# Patient Record
Sex: Female | Born: 1960
Health system: Southern US, Community
[De-identification: ages and names within clinical notes are randomized; demographics above are authoritative.]

## PROBLEM LIST (undated history)

## (undated) DIAGNOSIS — T7840XA Allergy, unspecified, initial encounter: Secondary | ICD-10-CM

## (undated) DIAGNOSIS — R079 Chest pain, unspecified: Secondary | ICD-10-CM

## (undated) DIAGNOSIS — U071 COVID-19: Secondary | ICD-10-CM

## (undated) DIAGNOSIS — K219 Gastro-esophageal reflux disease without esophagitis: Secondary | ICD-10-CM

## (undated) DIAGNOSIS — M5136 Other intervertebral disc degeneration, lumbar region: Secondary | ICD-10-CM

## (undated) DIAGNOSIS — E669 Obesity, unspecified: Secondary | ICD-10-CM

## (undated) DIAGNOSIS — M1990 Unspecified osteoarthritis, unspecified site: Secondary | ICD-10-CM

## (undated) DIAGNOSIS — D649 Anemia, unspecified: Secondary | ICD-10-CM

## (undated) DIAGNOSIS — E119 Type 2 diabetes mellitus without complications: Secondary | ICD-10-CM

## (undated) DIAGNOSIS — E785 Hyperlipidemia, unspecified: Secondary | ICD-10-CM

## (undated) DIAGNOSIS — M5430 Sciatica, unspecified side: Secondary | ICD-10-CM

## (undated) DIAGNOSIS — K429 Umbilical hernia without obstruction or gangrene: Secondary | ICD-10-CM

## (undated) DIAGNOSIS — I10 Essential (primary) hypertension: Secondary | ICD-10-CM

## (undated) DIAGNOSIS — R600 Localized edema: Secondary | ICD-10-CM

## (undated) DIAGNOSIS — G4730 Sleep apnea, unspecified: Secondary | ICD-10-CM

## (undated) DIAGNOSIS — M51369 Other intervertebral disc degeneration, lumbar region without mention of lumbar back pain or lower extremity pain: Secondary | ICD-10-CM

## (undated) DIAGNOSIS — E782 Mixed hyperlipidemia: Secondary | ICD-10-CM

## (undated) DIAGNOSIS — G629 Polyneuropathy, unspecified: Secondary | ICD-10-CM

## (undated) DIAGNOSIS — S6291XA Unspecified fracture of right wrist and hand, initial encounter for closed fracture: Secondary | ICD-10-CM

## (undated) HISTORY — DX: Obesity, unspecified: E66.9

## (undated) HISTORY — DX: Chest pain, unspecified: R07.9

## (undated) HISTORY — DX: Mixed hyperlipidemia: E78.2

## (undated) HISTORY — DX: Essential (primary) hypertension: I10

## (undated) HISTORY — DX: Sciatica, unspecified side: M54.30

## (undated) HISTORY — DX: Type 2 diabetes mellitus without complications: E11.9

## (undated) HISTORY — PX: HERNIA REPAIR: SHX51

## (undated) HISTORY — DX: Gastro-esophageal reflux disease without esophagitis: K21.9

## (undated) HISTORY — PX: ABDOMINAL HYSTERECTOMY: SHX81

## (undated) HISTORY — DX: Localized edema: R60.0

## (undated) HISTORY — DX: Umbilical hernia without obstruction or gangrene: K42.9

## (undated) HISTORY — PX: SUPRACERVICAL ABDOMINAL HYSTERECTOMY: SHX5393

## (undated) HISTORY — DX: Hyperlipidemia, unspecified: E78.5

## (undated) HISTORY — DX: Allergy, unspecified, initial encounter: T78.40XA

## (undated) HISTORY — DX: Unspecified fracture of right hand, initial encounter for closed fracture: S62.91XA

---
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
## 2014-12-10 ENCOUNTER — Encounter: Payer: Self-pay | Admitting: Family Medicine

## 2014-12-10 ENCOUNTER — Ambulatory Visit (INDEPENDENT_AMBULATORY_CARE_PROVIDER_SITE_OTHER): Payer: BLUE CROSS/BLUE SHIELD | Admitting: Family Medicine

## 2014-12-10 VITALS — BP 110/68 | HR 76 | Ht 63.0 in | Wt 184.8 lb

## 2014-12-10 DIAGNOSIS — E559 Vitamin D deficiency, unspecified: Secondary | ICD-10-CM

## 2014-12-10 DIAGNOSIS — E669 Obesity, unspecified: Secondary | ICD-10-CM | POA: Diagnosis not present

## 2014-12-10 DIAGNOSIS — R0789 Other chest pain: Secondary | ICD-10-CM | POA: Diagnosis not present

## 2014-12-10 DIAGNOSIS — M5430 Sciatica, unspecified side: Secondary | ICD-10-CM | POA: Diagnosis not present

## 2014-12-10 DIAGNOSIS — I10 Essential (primary) hypertension: Secondary | ICD-10-CM

## 2014-12-10 DIAGNOSIS — E1140 Type 2 diabetes mellitus with diabetic neuropathy, unspecified: Secondary | ICD-10-CM | POA: Insufficient documentation

## 2014-12-10 DIAGNOSIS — E785 Hyperlipidemia, unspecified: Secondary | ICD-10-CM | POA: Diagnosis not present

## 2014-12-10 DIAGNOSIS — K219 Gastro-esophageal reflux disease without esophagitis: Secondary | ICD-10-CM | POA: Diagnosis not present

## 2014-12-10 DIAGNOSIS — E119 Type 2 diabetes mellitus without complications: Secondary | ICD-10-CM

## 2014-12-10 DIAGNOSIS — E66811 Obesity, class 1: Secondary | ICD-10-CM

## 2014-12-10 HISTORY — DX: Obesity, unspecified: E66.9

## 2014-12-10 HISTORY — DX: Essential (primary) hypertension: I10

## 2014-12-10 HISTORY — DX: Type 2 diabetes mellitus without complications: E11.9

## 2014-12-10 HISTORY — DX: Obesity, class 1: E66.811

## 2014-12-10 HISTORY — DX: Sciatica, unspecified side: M54.30

## 2014-12-10 MED ORDER — ASPIRIN 81 MG PO TABS: 81.0000 mg | ORAL_TABLET | Freq: Every day | ORAL | Status: DC

## 2014-12-10 NOTE — Progress Notes (Addendum)
Date:  12/10/2014   Name:  Lisa Galvan   DOB:  Oct 18, 1960   MRN:  [id number]  PCP:  Schuyler Amor, MD    Chief Complaint: Establish Care; Hypertension; and Diabetes   History of Present Illness:  This is a 54 y.o. female with knife-like sternal CP 4d ago radiating to back while driving, no SOB noted, next had indigestion and following day some lightheadedness but all sxs resolved today. Takes Protonix bid for GERD, never had upper scope. HX DM well controlled and sciatica for which she was getting injections with benefit from pain clinic before recent move here. FH CABG in father. Last a1c in 7's.  Review of Systems:  Review of Systems  Constitutional: Negative for unexpected weight change.  Respiratory: Negative for cough.   Cardiovascular: Negative for palpitations and leg swelling.    There are no active problems to display for this patient.   Prior to Admission medications   Medication Sig Start Date End Date Taking? Authorizing Provider  amLODipine-benazepril (LOTREL) 10-20 MG per capsule Take 1 capsule by mouth daily.   Yes Historical Provider, MD  atorvastatin (LIPITOR) 20 MG tablet Take 20 mg by mouth daily.   Yes Historical Provider, MD  canagliflozin (INVOKANA) 300 MG TABS tablet Take 300 mg by mouth daily before breakfast.   Yes Historical Provider, MD  metFORMIN (GLUCOPHAGE) 500 MG tablet Take by mouth 2 (two) times daily with a meal.   Yes Historical Provider, MD  pantoprazole (PROTONIX) 40 MG tablet Take 40 mg by mouth 2 (two) times daily.   Yes Historical Provider, MD  triamterene-hydrochlorothiazide (DYAZIDE) 37.5-25 MG per capsule Take 1 capsule by mouth daily.   Yes Historical Provider, MD    Allergies no known allergies  No past surgical history on file.  Social History  Substance Use Topics  . Smoking status: Never Smoker   . Smokeless tobacco: None  . Alcohol Use: 0.0 oz/week    0 Standard drinks or equivalent per week    Family History  Problem  Relation Age of Onset  . Diabetes Mother   . Cancer Mother     breast and stomach  . Heart disease Father   . Kidney disease Father     dialysis  . Cancer Father     colon    Medication list has been reviewed and updated.  Physical Examination: BP 110/68 mmHg  Pulse 76  Ht 5\' 3"  (1.6 m)  Wt 184 lb 12.8 oz (83.825 kg)  BMI 32.74 kg/m2  Physical Exam  Constitutional: She is oriented to person, place, and time. She appears well-developed and well-nourished.  Neck: No thyromegaly present.  Cardiovascular: Normal rate, regular rhythm and normal heart sounds.   Pulmonary/Chest: Effort normal and breath sounds normal.  Abdominal: Soft. She exhibits no distension and no mass. There is no tenderness.  Musculoskeletal: She exhibits no edema.  Lymphadenopathy:    She has no cervical adenopathy.  Neurological: She is alert and oriented to person, place, and time. Coordination normal.  Skin: Skin is warm and dry. No rash noted.  Psychiatric: She has a normal mood and affect. Her behavior is normal.    Assessment and Plan:  1. Atypical chest pain Refer cardiology for possible stress test given multiple risk factors, take asa 81 mg daily - Ambulatory referral to Cardiology  2. Obesity (BMI 30.0-34.9) Discuss weight loss/exercise next visit - TSH - Vitamin D (25 hydroxy)  3. Chronic sciatica, unspecified laterality Refer pain clinic for resumption of  injections - Ambulatory referral to Pain Clinic  4. Essential hypertension Well controlled, continue current regimen - Comprehensive metabolic panel - CBC  5. Type 2 diabetes mellitus without complication Well controlled, continue current regimen - Urine Microalbumin w/creat. ratio - HgB A1c  6. Hyperlipidemia Unclear control, continue atorvastatin - Lipid Profile  7. Gastroesophageal reflux disease, esophagitis presence not specified Well controlled, continue Protonix for now, consider EGD  Return in about 4 weeks  (around 01/07/2015).  Dionne Ano. Kingsley Spittle MD [facility]  12/10/2014  Addendum: metformin increased to 1000 mg bid and vitamin D 5000 IU daily added, consider d/c Dyazide and decrease Protonix to daily next visit

## 2014-12-11 LAB — COMPREHENSIVE METABOLIC PANEL
ALK PHOS: 54 IU/L (ref 39–117)
ALT: 17 IU/L (ref 0–32)
AST: 20 IU/L (ref 0–40)
Albumin/Globulin Ratio: 2.1 (ref 1.1–2.5)
Albumin: 4.7 g/dL (ref 3.5–5.5)
BILIRUBIN TOTAL: 1.1 mg/dL (ref 0.0–1.2)
BUN / CREAT RATIO: 12 (ref 9–23)
BUN: 10 mg/dL (ref 6–24)
CHLORIDE: 95 mmol/L — AB (ref 97–108)
CO2: 25 mmol/L (ref 18–29)
CREATININE: 0.81 mg/dL (ref 0.57–1.00)
Calcium: 10.1 mg/dL (ref 8.7–10.2)
GFR calc Af Amer: 95 mL/min/{1.73_m2} (ref >59)
GFR calc non Af Amer: 82 mL/min/{1.73_m2} (ref >59)
GLOBULIN, TOTAL: 2.2 g/dL (ref 1.5–4.5)
Glucose: 104 mg/dL — ABNORMAL HIGH (ref 65–99)
POTASSIUM: 3.8 mmol/L (ref 3.5–5.2)
SODIUM: 140 mmol/L (ref 134–144)
Total Protein: 6.9 g/dL (ref 6.0–8.5)

## 2014-12-11 LAB — CBC
Hematocrit: 41.2 % (ref 34.0–46.6)
Hemoglobin: 13.9 g/dL (ref 11.1–15.9)
MCH: 27.8 pg (ref 26.6–33.0)
MCHC: 33.7 g/dL (ref 31.5–35.7)
MCV: 82 fL (ref 79–97)
PLATELETS: 256 10*3/uL (ref 150–379)
RBC: 5 x10E6/uL (ref 3.77–5.28)
RDW: 16 % — AB (ref 12.3–15.4)
WBC: 5.4 10*3/uL (ref 3.4–10.8)

## 2014-12-11 LAB — VITAMIN D 25 HYDROXY (VIT D DEFICIENCY, FRACTURES): Vit D, 25-Hydroxy: 9.1 ng/mL — ABNORMAL LOW (ref 30.0–100.0)

## 2014-12-11 LAB — TSH: TSH: 0.902 u[IU]/mL (ref 0.450–4.500)

## 2014-12-11 LAB — LIPID PANEL
CHOLESTEROL TOTAL: 172 mg/dL (ref 100–199)
Chol/HDL Ratio: 1.8 ratio units (ref 0.0–4.4)
HDL: 95 mg/dL (ref >39)
LDL Calculated: 59 mg/dL (ref 0–99)
TRIGLYCERIDES: 90 mg/dL (ref 0–149)
VLDL CHOLESTEROL CAL: 18 mg/dL (ref 5–40)

## 2014-12-11 LAB — HEMOGLOBIN A1C
ESTIMATED AVERAGE GLUCOSE: 169 mg/dL
Hgb A1c MFr Bld: 7.5 % — ABNORMAL HIGH (ref 4.8–5.6)

## 2014-12-11 LAB — MICROALBUMIN / CREATININE URINE RATIO: MICROALB/CREAT RATIO: <4 mg/g creat (ref 0.0–30.0)

## 2014-12-13 DIAGNOSIS — E559 Vitamin D deficiency, unspecified: Secondary | ICD-10-CM | POA: Insufficient documentation

## 2014-12-13 DIAGNOSIS — R809 Proteinuria, unspecified: Secondary | ICD-10-CM | POA: Insufficient documentation

## 2014-12-13 MED ORDER — VITAMIN D3 125 MCG (5000 UT) PO CAPS: 1.0000 | ORAL_CAPSULE | Freq: Every day | ORAL | Status: DC

## 2014-12-13 NOTE — Addendum Note (Signed)
Addended by: Schuyler Amor on: 12/13/2014 12:41 PM   Modules accepted: Orders, SmartSet

## 2014-12-15 DIAGNOSIS — R079 Chest pain, unspecified: Secondary | ICD-10-CM

## 2014-12-15 DIAGNOSIS — E782 Mixed hyperlipidemia: Secondary | ICD-10-CM | POA: Insufficient documentation

## 2014-12-15 HISTORY — DX: Chest pain, unspecified: R07.9

## 2014-12-16 ENCOUNTER — Encounter: Payer: Self-pay | Admitting: Family Medicine

## 2014-12-16 ENCOUNTER — Ambulatory Visit: Payer: BLUE CROSS/BLUE SHIELD | Attending: Pain Medicine | Admitting: Pain Medicine

## 2014-12-16 ENCOUNTER — Other Ambulatory Visit: Payer: Self-pay | Admitting: Family Medicine

## 2014-12-16 ENCOUNTER — Encounter: Payer: Self-pay | Admitting: Pain Medicine

## 2014-12-16 VITALS — BP 102/72 | HR 74 | Temp 97.8°F | Resp 16 | Ht 63.0 in | Wt 185.0 lb

## 2014-12-16 DIAGNOSIS — M5416 Radiculopathy, lumbar region: Secondary | ICD-10-CM | POA: Diagnosis not present

## 2014-12-16 DIAGNOSIS — M4806 Spinal stenosis, lumbar region: Secondary | ICD-10-CM | POA: Insufficient documentation

## 2014-12-16 DIAGNOSIS — M533 Sacrococcygeal disorders, not elsewhere classified: Secondary | ICD-10-CM

## 2014-12-16 DIAGNOSIS — M47816 Spondylosis without myelopathy or radiculopathy, lumbar region: Secondary | ICD-10-CM

## 2014-12-16 DIAGNOSIS — M545 Low back pain: Secondary | ICD-10-CM | POA: Diagnosis present

## 2014-12-16 DIAGNOSIS — E119 Type 2 diabetes mellitus without complications: Secondary | ICD-10-CM | POA: Diagnosis not present

## 2014-12-16 DIAGNOSIS — E1340 Other specified diabetes mellitus with diabetic neuropathy, unspecified: Secondary | ICD-10-CM

## 2014-12-16 DIAGNOSIS — M79605 Pain in left leg: Secondary | ICD-10-CM | POA: Diagnosis present

## 2014-12-16 DIAGNOSIS — M5136 Other intervertebral disc degeneration, lumbar region: Secondary | ICD-10-CM | POA: Insufficient documentation

## 2014-12-16 DIAGNOSIS — I10 Essential (primary) hypertension: Secondary | ICD-10-CM | POA: Insufficient documentation

## 2014-12-16 DIAGNOSIS — M79604 Pain in right leg: Secondary | ICD-10-CM | POA: Diagnosis present

## 2014-12-16 NOTE — Patient Instructions (Addendum)
GENERAL RISKS AND COMPLICATIONS  What are the risk, side effects and possible complications? Generally speaking, most procedures are safe.  However, with any procedure there are risks, side effects, and the possibility of complications.  The risks and complications are dependent upon the sites that are lesioned, or the type of nerve block to be performed.  The closer the procedure is to the spine, the more serious the risks are.  Great care is taken when placing the radio frequency needles, block needles or lesioning probes, but sometimes complications can occur. 1. Infection: Any time there is an injection through the skin, there is a risk of infection.  This is why sterile conditions are used for these blocks.  There are four possible types of infection. 1. Localized skin infection. 2. Central Nervous System Infection-This can be in the form of Meningitis, which can be deadly. 3. Epidural Infections-This can be in the form of an epidural abscess, which can cause pressure inside of the spine, causing compression of the spinal cord with subsequent paralysis. This would require an emergency surgery to decompress, and there are no guarantees that the patient would recover from the paralysis. 4. Discitis-This is an infection of the intervertebral discs.  It occurs in about 1% of discography procedures.  It is difficult to treat and it may lead to surgery.        2. Pain: the needles have to go through skin and soft tissues, will cause soreness.       3. Damage to internal structures:  The nerves to be lesioned may be near blood vessels or    other nerves which can be potentially damaged.       4. Bleeding: Bleeding is more common if the patient is taking blood thinners such as  aspirin, Coumadin, Ticiid, Plavix, etc., or if he/she have some genetic predisposition  such as hemophilia. Bleeding into the spinal canal can cause compression of the spinal  cord with subsequent paralysis.  This would require an  emergency surgery to  decompress and there are no guarantees that the patient would recover from the  paralysis.       5. Pneumothorax:  Puncturing of a lung is a possibility, every time a needle is introduced in  the area of the chest or upper back.  Pneumothorax refers to free air around the  collapsed lung(s), inside of the thoracic cavity (chest cavity).  Another two possible  complications related to a similar event would include: Hemothorax and Chylothorax.   These are variations of the Pneumothorax, where instead of air around the collapsed  lung(s), you may have blood or chyle, respectively.       6. Spinal headaches: They may occur with any procedures in the area of the spine.       7. Persistent CSF (Cerebro-Spinal Fluid) leakage: This is a rare problem, but may occur  with prolonged intrathecal or epidural catheters either due to the formation of a fistulous  track or a dural tear.       8. Nerve damage: By working so close to the spinal cord, there is always a possibility of  nerve damage, which could be as serious as a permanent spinal cord injury with  paralysis.       9. Death:  Although rare, severe deadly allergic reactions known as "Anaphylactic  reaction" can occur to any of the medications used.      10. Worsening of the symptoms:  We can always make thing worse.    What are the chances of something like this happening? Chances of any of this occuring are extremely low.  By statistics, you have more of a chance of getting killed in a motor vehicle accident: while driving to the hospital than any of the above occurring .  Nevertheless, you should be aware that they are possibilities.  In general, it is similar to taking a shower.  Everybody knows that you can slip, hit your head and get killed.  Does that mean that you should not shower again?  Nevertheless always keep in mind that statistics do not mean anything if you happen to be on the wrong side of them.  Even if a procedure has a 1  (one) in a 1,000,000 (million) chance of going wrong, it you happen to be that one..Also, keep in mind that by statistics, you have more of a chance of having something go wrong when taking medications.  Who should not have this procedure? If you are on a blood thinning medication (e.g. Coumadin, Plavix, see list of "Blood Thinners"), or if you have an active infection going on, you should not have the procedure.  If you are taking any blood thinners, please inform your physician.  How should I prepare for this procedure?  Do not eat or drink anything at least six hours prior to the procedure.  Bring a driver with you .  It cannot be a taxi.  Come accompanied by an adult that can drive you back, and that is strong enough to help you if your legs get weak or numb from the local anesthetic.  Take all of your medicines the morning of the procedure with just enough water to swallow them.  If you have diabetes, make sure that you are scheduled to have your procedure done first thing in the morning, whenever possible.  If you have diabetes, take only half of your insulin dose and notify our nurse that you have done so as soon as you arrive at the clinic.  If you are diabetic, but only take blood sugar pills (oral hypoglycemic), then do not take them on the morning of your procedure.  You may take them after you have had the procedure.  Do not take aspirin or any aspirin-containing medications, at least eleven (11) days prior to the procedure.  They may prolong bleeding.  Wear loose fitting clothing that may be easy to take off and that you would not mind if it got stained with Betadine or blood.  Do not wear any jewelry or perfume  Remove any nail coloring.  It will interfere with some of our monitoring equipment.  NOTE: Remember that this is not meant to be interpreted as a complete list of all possible complications.  Unforeseen problems may occur.  BLOOD THINNERS The following drugs  contain aspirin or other products, which can cause increased bleeding during surgery and should not be taken for 2 weeks prior to and 1 week after surgery.  If you should need take something for relief of minor pain, you may take acetaminophen which is found in Tylenol,m Datril, Anacin-3 and Panadol. It is not blood thinner. The products listed below are.  Do not take any of the products listed below in addition to any listed on your instruction sheet.  A.P.C or A.P.C with Codeine Codeine Phosphate Capsules #3 Ibuprofen Ridaura  ABC compound Congesprin Imuran rimadil  Advil Cope Indocin Robaxisal  Alka-Seltzer Effervescent Pain Reliever and Antacid Coricidin or Coricidin-D  Indomethacin Rufen    Alka-Seltzer plus Cold Medicine Cosprin Ketoprofen S-A-C Tablets  Anacin Analgesic Tablets or Capsules Coumadin Korlgesic Salflex  Anacin Extra Strength Analgesic tablets or capsules CP-2 Tablets Lanoril Salicylate  Anaprox Cuprimine Capsules Levenox Salocol  Anexsia-D Dalteparin Magan Salsalate  Anodynos Darvon compound Magnesium Salicylate Sine-off  Ansaid Dasin Capsules Magsal Sodium Salicylate  Anturane Depen Capsules Marnal Soma  APF Arthritis pain formula Dewitt's Pills Measurin Stanback  Argesic Dia-Gesic Meclofenamic Sulfinpyrazone  Arthritis Bayer Timed Release Aspirin Diclofenac Meclomen Sulindac  Arthritis pain formula Anacin Dicumarol Medipren Supac  Analgesic (Safety coated) Arthralgen Diffunasal Mefanamic Suprofen  Arthritis Strength Bufferin Dihydrocodeine Mepro Compound Suprol  Arthropan liquid Dopirydamole Methcarbomol with Aspirin Synalgos  ASA tablets/Enseals Disalcid Micrainin Tagament  Ascriptin Doan's Midol Talwin  Ascriptin A/D Dolene Mobidin Tanderil  Ascriptin Extra Strength Dolobid Moblgesic Ticlid  Ascriptin with Codeine Doloprin or Doloprin with Codeine Momentum Tolectin  Asperbuf Duoprin Mono-gesic Trendar  Aspergum Duradyne Motrin or Motrin IB Triminicin  Aspirin  plain, buffered or enteric coated Durasal Myochrisine Trigesic  Aspirin Suppositories Easprin Nalfon Trillsate  Aspirin with Codeine Ecotrin Regular or Extra Strength Naprosyn Uracel  Atromid-S Efficin Naproxen Ursinus  Auranofin Capsules Elmiron Neocylate Vanquish  Axotal Emagrin Norgesic Verin  Azathioprine Empirin or Empirin with Codeine Normiflo Vitamin E  Azolid Emprazil Nuprin Voltaren  Bayer Aspirin plain, buffered or children's or timed BC Tablets or powders Encaprin Orgaran Warfarin Sodium  Buff-a-Comp Enoxaparin Orudis Zorpin  Buff-a-Comp with Codeine Equegesic Os-Cal-Gesic   Buffaprin Excedrin plain, buffered or Extra Strength Oxalid   Bufferin Arthritis Strength Feldene Oxphenbutazone   Bufferin plain or Extra Strength Feldene Capsules Oxycodone with Aspirin   Bufferin with Codeine Fenoprofen Fenoprofen Pabalate or Pabalate-SF   Buffets II Flogesic Panagesic   Buffinol plain or Extra Strength Florinal or Florinal with Codeine Panwarfarin   Buf-Tabs Flurbiprofen Penicillamine   Butalbital Compound Four-way cold tablets Penicillin   Butazolidin Fragmin Pepto-Bismol   Carbenicillin Geminisyn Percodan   Carna Arthritis Reliever Geopen Persantine   Carprofen Gold's salt Persistin   Chloramphenicol Goody's Phenylbutazone   Chloromycetin Haltrain Piroxlcam   Clmetidine heparin Plaquenil   Cllnoril Hyco-pap Ponstel   Clofibrate Hydroxy chloroquine Propoxyphen         Before stopping any of these medications, be sure to consult the physician who ordered them.  Some, such as Coumadin (Warfarin) are ordered to prevent or treat serious conditions such as "deep thrombosis", "pumonary embolisms", and other heart problems.  The amount of time that you may need off of the medication may also vary with the medication and the reason for which you were taking it.  If you are taking any of these medications, please make sure you notify your pain physician before you undergo any  procedures.         Epidural Steroid Injection Patient Information  Description: The epidural space surrounds the nerves as they exit the spinal cord.  In some patients, the nerves can be compressed and inflamed by a bulging disc or a tight spinal canal (spinal stenosis).  By injecting steroids into the epidural space, we can bring irritated nerves into direct contact with a potentially helpful medication.  These steroids act directly on the irritated nerves and can reduce swelling and inflammation which often leads to decreased pain.  Epidural steroids may be injected anywhere along the spine and from the neck to the low back depending upon the location of your pain.   After numbing the skin with local anesthetic (like Novocaine), a small needle is passed   into the epidural space slowly.  You may experience a sensation of pressure while this is being done.  The entire block usually last less than 10 minutes.  Conditions which may be treated by epidural steroids:   Low back and leg pain  Neck and arm pain  Spinal stenosis  Post-laminectomy syndrome  Herpes zoster (shingles) pain  Pain from compression fractures  Preparation for the injection:  1. Do not eat any solid food or dairy products within 6 hours of your appointment.  2. You may drink clear liquids up to 2 hours before appointment.  Clear liquids include water, black coffee, juice or soda.  No milk or cream please. 3. You may take your regular medication, including pain medications, with a sip of water before your appointment  Diabetics should hold regular insulin (if taken separately) and take 1/2 normal NPH dos the morning of the procedure.  Carry some sugar containing items with you to your appointment. 4. A driver must accompany you and be prepared to drive you home after your procedure.  5. Bring all your current medications with your. 6. An IV may be inserted and sedation may be given at the discretion of the  physician.   7. A blood pressure cuff, EKG and other monitors will often be applied during the procedure.  Some patients may need to have extra oxygen administered for a short period. 8. You will be asked to provide medical information, including your allergies, prior to the procedure.  We must know immediately if you are taking blood thinners (like Coumadin/Warfarin)  Or if you are allergic to IV iodine contrast (dye). We must know if you could possible be pregnant.  Possible side-effects:  Bleeding from needle site  Infection (rare, may require surgery)  Nerve injury (rare)  Numbness & tingling (temporary)  Difficulty urinating (rare, temporary)  Spinal headache ( a headache worse with upright posture)  Light -headedness (temporary)  Pain at injection site (several days)  Decreased blood pressure (temporary)  Weakness in arm/leg (temporary)  Pressure sensation in back/neck (temporary)  Call if you experience:  Fever/chills associated with headache or increased back/neck pain.  Headache worsened by an upright position.  New onset weakness or numbness of an extremity below the injection site  Hives or difficulty breathing (go to the emergency room)  Inflammation or drainage at the infection site  Severe back/neck pain  Any new symptoms which are concerning to you  Please note:  Although the local anesthetic injected can often make your back or neck feel good for several hours after the injection, the pain will likely return.  It takes 3-7 days for steroids to work in the epidural space.  You may not notice any pain relief for at least that one week.  If effective, we will often do a series of three injections spaced 3-6 weeks apart to maximally decrease your pain.  After the initial series, we generally will wait several months before considering a repeat injection of the same type.  If you have any questions, please call [phone] [facility]  Continue present medications  Lumbar epidural steroid injection to be performed at time of return appointment  F/U PCP Dr. Schuyler Amor    for evaliation of  BP diabetes mellitus  and general medical  condition  F/U surgical evaluation to  be considered   F/U neurological evaluation to be considered   May consider radiofrequency rhizolysis or intraspinal procedures pending response to  present treatment and F/U evaluation   Patient to call [facility] should patient have concerns prior to scheduled return appointmen.  GENERAL RISKS AND COMPLICATIONS  What are the risk, side effects and possible complications? Generally speaking, most procedures are safe.  However, with any procedure there are risks, side effects, and the possibility of complications.  The risks and complications are dependent upon the sites that are lesioned, or the type of nerve block to be performed.  The closer the procedure is to the spine, the more serious the risks are.  Great care is taken when placing the radio frequency needles, block needles or lesioning probes, but sometimes complications can occur. 1. Infection: Any time there is an injection through the skin, there is a risk of infection.  This is why sterile conditions are used for these blocks.  There are four possible types of infection. 1. Localized skin infection. 2. Central Nervous System Infection-This can be in the form of Meningitis, which can be deadly. 3. Epidural Infections-This can be in the form of an epidural abscess, which can cause pressure inside of the spine, causing compression of the spinal cord with subsequent paralysis. This would require an emergency surgery to decompress, and there are no guarantees that the patient would recover from the paralysis. 4. Discitis-This is an infection of the intervertebral discs.  It occurs in about 1% of discography procedures.  It is difficult to treat and it may lead to  surgery.        2. Pain: the needles have to go through skin and soft tissues, will cause soreness.       3. Damage to internal structures:  The nerves to be lesioned may be near blood vessels or    other nerves which can be potentially damaged.       4. Bleeding: Bleeding is more common if the patient is taking blood thinners such as  aspirin, Coumadin, Ticiid, Plavix, etc., or if he/she have some genetic predisposition  such as hemophilia. Bleeding into the spinal canal can cause compression of the spinal  cord with subsequent paralysis.  This would require an emergency surgery to  decompress and there are no guarantees that the patient would recover from the  paralysis.       5. Pneumothorax:  Puncturing of a lung is a possibility, every time a needle is introduced in  the area of the chest or upper back.  Pneumothorax refers to free air around the  collapsed lung(s), inside of the thoracic cavity (chest cavity).  Another two possible  complications related to a similar event would include: Hemothorax and Chylothorax.   These are variations of the Pneumothorax, where instead of air around the collapsed  lung(s), you may have blood or chyle, respectively.       6. Spinal headaches: They may occur with any procedures in the area of the spine.       7. Persistent CSF (Cerebro-Spinal Fluid) leakage: This is a rare problem, but may occur  with prolonged intrathecal or epidural catheters either due to the formation of a fistulous  track or a dural tear.       8. Nerve damage: By working so close to the spinal cord, there is always a possibility of  nerve damage, which could be as serious as a permanent spinal cord injury with  paralysis.       9. Death:  Although rare, severe deadly allergic reactions known as "Anaphylactic  reaction" can occur  to any of the medications used.      10. Worsening of the symptoms:  We can always make thing worse.  What are the chances of something like this  happening? Chances of any of this occuring are extremely low.  By statistics, you have more of a chance of getting killed in a motor vehicle accident: while driving to the hospital than any of the above occurring .  Nevertheless, you should be aware that they are possibilities.  In general, it is similar to taking a shower.  Everybody knows that you can slip, hit your head and get killed.  Does that mean that you should not shower again?  Nevertheless always keep in mind that statistics do not mean anything if you happen to be on the wrong side of them.  Even if a procedure has a 1 (one) in a 1,000,000 (million) chance of going wrong, it you happen to be that one..Also, keep in mind that by statistics, you have more of a chance of having something go wrong when taking medications.  Who should not have this procedure? If you are on a blood thinning medication (e.g. Coumadin, Plavix, see list of "Blood Thinners"), or if you have an active infection going on, you should not have the procedure.  If you are taking any blood thinners, please inform your physician.  How should I prepare for this procedure?  Do not eat or drink anything at least six hours prior to the procedure.  Bring a driver with you .  It cannot be a taxi.  Come accompanied by an adult that can drive you back, and that is strong enough to help you if your legs get weak or numb from the local anesthetic.  Take all of your medicines the morning of the procedure with just enough water to swallow them.  If you have diabetes, make sure that you are scheduled to have your procedure done first thing in the morning, whenever possible.  If you have diabetes, take only half of your insulin dose and notify our nurse that you have done so as soon as you arrive at the clinic.  If you are diabetic, but only take blood sugar pills (oral hypoglycemic), then do not take them on the morning of your procedure.  You may take them after you have had the  procedure.  Do not take aspirin or any aspirin-containing medications, at least eleven (11) days prior to the procedure.  They may prolong bleeding.  Wear loose fitting clothing that may be easy to take off and that you would not mind if it got stained with Betadine or blood.  Do not wear any jewelry or perfume  Remove any nail coloring.  It will interfere with some of our monitoring equipment.  NOTE: Remember that this is not meant to be interpreted as a complete list of all possible complications.  Unforeseen problems may occur.  BLOOD THINNERS The following drugs contain aspirin or other products, which can cause increased bleeding during surgery and should not be taken for 2 weeks prior to and 1 week after surgery.  If you should need take something for relief of minor pain, you may take acetaminophen which is found in Tylenol,m Datril, Anacin-3 and Panadol. It is not blood thinner. The products listed below are.  Do not take any of the products listed below in addition to any listed on your instruction sheet.  A.P.C or A.P.C with Codeine Codeine Phosphate Capsules #3 Ibuprofen Ridaura  ABC compound Congesprin Imuran rimadil  Advil Cope Indocin Robaxisal  Alka-Seltzer Effervescent Pain Reliever and Antacid Coricidin or Coricidin-D  Indomethacin Rufen  Alka-Seltzer plus Cold Medicine Cosprin Ketoprofen S-A-C Tablets  Anacin Analgesic Tablets or Capsules Coumadin Korlgesic Salflex  Anacin Extra Strength Analgesic tablets or capsules CP-2 Tablets Lanoril Salicylate  Anaprox Cuprimine Capsules Levenox Salocol  Anexsia-D Dalteparin Magan Salsalate  Anodynos Darvon compound Magnesium Salicylate Sine-off  Ansaid Dasin Capsules Magsal Sodium Salicylate  Anturane Depen Capsules Marnal Soma  APF Arthritis pain formula Dewitt's Pills Measurin Stanback  Argesic Dia-Gesic Meclofenamic Sulfinpyrazone  Arthritis Bayer Timed Release Aspirin Diclofenac Meclomen Sulindac  Arthritis pain formula  Anacin Dicumarol Medipren Supac  Analgesic (Safety coated) Arthralgen Diffunasal Mefanamic Suprofen  Arthritis Strength Bufferin Dihydrocodeine Mepro Compound Suprol  Arthropan liquid Dopirydamole Methcarbomol with Aspirin Synalgos  ASA tablets/Enseals Disalcid Micrainin Tagament  Ascriptin Doan's Midol Talwin  Ascriptin A/D Dolene Mobidin Tanderil  Ascriptin Extra Strength Dolobid Moblgesic Ticlid  Ascriptin with Codeine Doloprin or Doloprin with Codeine Momentum Tolectin  Asperbuf Duoprin Mono-gesic Trendar  Aspergum Duradyne Motrin or Motrin IB Triminicin  Aspirin plain, buffered or enteric coated Durasal Myochrisine Trigesic  Aspirin Suppositories Easprin Nalfon Trillsate  Aspirin with Codeine Ecotrin Regular or Extra Strength Naprosyn Uracel  Atromid-S Efficin Naproxen Ursinus  Auranofin Capsules Elmiron Neocylate Vanquish  Axotal Emagrin Norgesic Verin  Azathioprine Empirin or Empirin with Codeine Normiflo Vitamin E  Azolid Emprazil Nuprin Voltaren  Bayer Aspirin plain, buffered or children's or timed BC Tablets or powders Encaprin Orgaran Warfarin Sodium  Buff-a-Comp Enoxaparin Orudis Zorpin  Buff-a-Comp with Codeine Equegesic Os-Cal-Gesic   Buffaprin Excedrin plain, buffered or Extra Strength Oxalid   Bufferin Arthritis Strength Feldene Oxphenbutazone   Bufferin plain or Extra Strength Feldene Capsules Oxycodone with Aspirin   Bufferin with Codeine Fenoprofen Fenoprofen Pabalate or Pabalate-SF   Buffets II Flogesic Panagesic   Buffinol plain or Extra Strength Florinal or Florinal with Codeine Panwarfarin   Buf-Tabs Flurbiprofen Penicillamine   Butalbital Compound Four-way cold tablets Penicillin   Butazolidin Fragmin Pepto-Bismol   Carbenicillin Geminisyn Percodan   Carna Arthritis Reliever Geopen Persantine   Carprofen Gold's salt Persistin   Chloramphenicol Goody's Phenylbutazone   Chloromycetin Haltrain Piroxlcam   Clmetidine heparin Plaquenil   Cllnoril Hyco-pap  Ponstel   Clofibrate Hydroxy chloroquine Propoxyphen         Before stopping any of these medications, be sure to consult the physician who ordered them.  Some, such as Coumadin (Warfarin) are ordered to prevent or treat serious conditions such as "deep thrombosis", "pumonary embolisms", and other heart problems.  The amount of time that you may need off of the medication may also vary with the medication and the reason for which you were taking it.  If you are taking any of these medications, please make sure you notify your pain physician before you undergo any procedures.         Epidural Steroid Injection Patient Information  Description: The epidural space surrounds the nerves as they exit the spinal cord.  In some patients, the nerves can be compressed and inflamed by a bulging disc or a tight spinal canal (spinal stenosis).  By injecting steroids into the epidural space, we can bring irritated nerves into direct contact with a potentially helpful medication.  These steroids act directly on the irritated nerves and can reduce swelling and inflammation which often leads to decreased pain.  Epidural steroids may be injected anywhere along the spine and from the neck to the low  back depending upon the location of your pain.   After numbing the skin with local anesthetic (like Novocaine), a small needle is passed into the epidural space slowly.  You may experience a sensation of pressure while this is being done.  The entire block usually last less than 10 minutes.  Conditions which may be treated by epidural steroids:   Low back and leg pain  Neck and arm pain  Spinal stenosis  Post-laminectomy syndrome  Herpes zoster (shingles) pain  Pain from compression fractures  Preparation for the injection:  9. Do not eat any solid food or dairy products within 6 hours of your appointment.  10. You may drink clear liquids up to 2 hours before appointment.  Clear liquids include water,  black coffee, juice or soda.  No milk or cream please. 11. You may take your regular medication, including pain medications, with a sip of water before your appointment  Diabetics should hold regular insulin (if taken separately) and take 1/2 normal NPH dos the morning of the procedure.  Carry some sugar containing items with you to your appointment. 12. A driver must accompany you and be prepared to drive you home after your procedure.  13. Bring all your current medications with your. 14. An IV may be inserted and sedation may be given at the discretion of the physician.   15. A blood pressure cuff, EKG and other monitors will often be applied during the procedure.  Some patients may need to have extra oxygen administered for a short period. 16. You will be asked to provide medical information, including your allergies, prior to the procedure.  We must know immediately if you are taking blood thinners (like Coumadin/Warfarin)  Or if you are allergic to IV iodine contrast (dye). We must know if you could possible be pregnant.  Possible side-effects:  Bleeding from needle site  Infection (rare, may require surgery)  Nerve injury (rare)  Numbness & tingling (temporary)  Difficulty urinating (rare, temporary)  Spinal headache ( a headache worse with upright posture)  Light -headedness (temporary)  Pain at injection site (several days)  Decreased blood pressure (temporary)  Weakness in arm/leg (temporary)  Pressure sensation in back/neck (temporary)  Call if you experience:  Fever/chills associated with headache or increased back/neck pain.  Headache worsened by an upright position.  New onset weakness or numbness of an extremity below the injection site  Hives or difficulty breathing (go to the emergency room)  Inflammation or drainage at the infection site  Severe back/neck pain  Any new symptoms which are concerning to you  Please note:  Although the local anesthetic  injected can often make your back or neck feel good for several hours after the injection, the pain will likely return.  It takes 3-7 days for steroids to work in the epidural space.  You may not notice any pain relief for at least that one week.  If effective, we will often do a series of three injections spaced 3-6 weeks apart to maximally decrease your pain.  After the initial series, we generally will wait several months before considering a repeat injection of the same type.  If you have any questions, please call [phone] [facility]

## 2014-12-16 NOTE — Progress Notes (Signed)
Subjective:    Patient ID: Lisa Galvan, female    DOB: December 24, 1960, 54 y.o.   MRN: [id number]  HPI  Patient is a 54 year old female who comes to pain management Center at the request of Dr. Schuyler Amor for further evaluation and treatment of lower back and lower extremity pain. Patient states that her pain began insidiously approximately 1-1/2 years ago. Patient is undergone prior evaluation and treatment of her condition at pain management facility in [place]. Patient stated that she is been informed that she has patient nerves contributing to her symptoms. We discussed patient's condition on today's visit and attempted to obtain previous lumbar MRI reports. For further detailed explanation of patient's condition. The patient described her pain is shooting type sensation from the lumbar region to the lower extremity affecting the left lower extremity more than the right lower extremity. Patient states the pain increases with walking and at times lying down. Patient stated the pain had decreased with nerve blocks and with medications. We discussed patient's condition and will consider interventional treatment at time return appointment as discussed and explained to patient on today's visit. Patient will for significant period of time and was still unable to obtain her lumbar MRI report from her diagnostic Center from the Pain Management Center. We will await the results of the studies and if we were able to obtain the previous study we will order a new lumbar MRI and consider other studies as well. The patient was understanding and agrees suggested treatment plan. The present time we had tentatively schedule patient for lumbar epidural steroid injection to be performed at time return appointment in attempt to decrease severity of symptoms, minimize progression of symptoms and avoid the need for more involved treatment. The patient was understanding and agreement status treatment plan.  Review of  Systems   Cardiovascular High blood pressure Daily aspirin intake  Pulmonary Unremarkable  Neurological Unremarkable  Psychological Unremarkable  Gastrointestinal Unremarkable  Genitourinary Unremarkable  Hematological Unremarkable  Endocrine Diabetes mellitus  Rheumatological Unremarkable  Musculoskeletal Unremarkable  Other significant Unremarkable     Objective:   Physical Exam  There was tends to palpation splenius capitis and occipitalis musculature regions of mild degree. There was mild tenderness of the acromioclavicular and glenohumeral joint regions. Palpation of the cervical facet and cervical paraspinal musculature region reproduced mild discomfort. There was mild tends over the thoracic facet thoracic paraspinal musculature region. No crepitus of the thoracic region was noted. Tinel and Phalen's maneuver were without increased pain of significant degree. Patient appeared to be with bilaterally equal grip strength. Palpation of the lumbar paraspinal muscles region lumbar facet region was a tends to palpation of moderate moderately severe degree. Lateral bending and rotation extension and palpation of the lumbar facets reproduce moderate to moderately severe discomfort. There was tenderness over the PSIS and PII S region of moderate degree. Mild tenderness of the greater trochanteric region iliotibial band region. Straight leg raising was tolerates approximately 20 without a definite increased pain with dorsiflexion noted. There appeared to be negative clonus negative Homans. Patient was with mild difficulty attempt attempted to stand on tiptoes and heels. He was negative clonus negative Homans EHL strength appeared to be decreased. No definite sensory deficit of dermatomal distribution detected. Abdomen soft nontender and no costovertebral tenderness was noted.      Assessment & Plan:   Degenerative disc disease lumbar spine  Lumbar facet  syndrome  Lumbar radiculopathy  Lumbar stenosis  Sacroiliac joint dysfunction   Plan  Continue present medications  Lumbar epidural steroid injection to be performed at time return appointment  F/U PCP  Dr. Schuyler Amor for evaliation of  BP and general medical  condition  F/U surgical evaluation has been addressed and will be considered as discussed  F/U neurological evaluation. May consider PNCV/EMG studies pending further assessment of patient's condition response to present treatment  Lumbar MRI. Patient will bring a report of her lumbar MRI if we are unable to obtain the report from patient's imaging facility or patient's pain management facility. We will order a lumbar MRI if we are unable to obtain patient's prior lumbar MRI results  May consider radiofrequency rhizolysis or intraspinal procedures pending response to present treatment and F/U evaluation   Patient to call Pain Management Center should patient have concerns prior to scheduled return appointment.

## 2014-12-16 NOTE — Progress Notes (Signed)
Discharged to home ambulatory.  Pre procedure instructions given with teach back 3 done. 

## 2014-12-16 NOTE — Progress Notes (Signed)
Safety precautions to be maintained throughout the outpatient stay will include: orient to surroundings, keep bed in low position, maintain call bell within reach at all times, provide assistance with transfer out of bed and ambulation.  

## 2014-12-23 ENCOUNTER — Other Ambulatory Visit: Payer: Self-pay | Admitting: Pain Medicine

## 2014-12-29 ENCOUNTER — Encounter: Payer: Self-pay | Admitting: Pain Medicine

## 2014-12-29 ENCOUNTER — Ambulatory Visit: Payer: BLUE CROSS/BLUE SHIELD | Attending: Pain Medicine | Admitting: Pain Medicine

## 2014-12-29 VITALS — BP 116/74 | HR 81 | Temp 98.0°F | Resp 18 | Ht 63.0 in | Wt 184.0 lb

## 2014-12-29 DIAGNOSIS — M545 Low back pain: Secondary | ICD-10-CM | POA: Diagnosis present

## 2014-12-29 DIAGNOSIS — M533 Sacrococcygeal disorders, not elsewhere classified: Secondary | ICD-10-CM

## 2014-12-29 DIAGNOSIS — M79604 Pain in right leg: Secondary | ICD-10-CM | POA: Diagnosis present

## 2014-12-29 DIAGNOSIS — M79605 Pain in left leg: Secondary | ICD-10-CM | POA: Diagnosis present

## 2014-12-29 DIAGNOSIS — E1340 Other specified diabetes mellitus with diabetic neuropathy, unspecified: Secondary | ICD-10-CM

## 2014-12-29 DIAGNOSIS — M51369 Other intervertebral disc degeneration, lumbar region without mention of lumbar back pain or lower extremity pain: Secondary | ICD-10-CM

## 2014-12-29 DIAGNOSIS — M4316 Spondylolisthesis, lumbar region: Secondary | ICD-10-CM | POA: Diagnosis not present

## 2014-12-29 DIAGNOSIS — M5126 Other intervertebral disc displacement, lumbar region: Secondary | ICD-10-CM | POA: Diagnosis not present

## 2014-12-29 DIAGNOSIS — M47816 Spondylosis without myelopathy or radiculopathy, lumbar region: Secondary | ICD-10-CM | POA: Diagnosis not present

## 2014-12-29 DIAGNOSIS — M5136 Other intervertebral disc degeneration, lumbar region: Secondary | ICD-10-CM

## 2014-12-29 MED ORDER — TRIAMCINOLONE ACETONIDE 40 MG/ML IJ SUSP
INTRAMUSCULAR | Status: DC
Start: 2014-12-29 — End: 2014-12-29
  Filled 2014-12-29: qty 1

## 2014-12-29 MED ORDER — TRIAMCINOLONE ACETONIDE 40 MG/ML IJ SUSP (RADIOLOGY): 40.0000 mg | Freq: Once | INTRAMUSCULAR | Status: DC

## 2014-12-29 MED ORDER — SODIUM CHLORIDE 0.9 % IJ SOLN: 10.0000 mL | Freq: Once | INTRAMUSCULAR | Status: DC

## 2014-12-29 MED ORDER — LIDOCAINE HCL (PF) 1 % IJ SOLN: 5.0000 mL | Freq: Once | INTRAMUSCULAR | Status: DC

## 2014-12-29 MED ORDER — SODIUM CHLORIDE 0.9 % IJ SOLN
INTRAMUSCULAR | Status: AC
  Filled 2014-12-29: qty 20

## 2014-12-29 MED ORDER — LIDOCAINE HCL (PF) 1 % IJ SOLN
INTRAMUSCULAR | Status: DC
Start: 2014-12-29 — End: 2014-12-29
  Filled 2014-12-29: qty 10

## 2014-12-29 NOTE — Progress Notes (Signed)
   Subjective:    Patient ID: Lisa Galvan, female    DOB: [birth year], 54 y.o.   MRN: [id number]  HPI  PROCEDURE PERFORMED: Lumbar epidural steroid injection   NOTE: The patient is a 54 y.o. female who returns to Pain Management Center for further evaluation and treatment of pain involving the lumbar and lower extremity region. MRI revealed the patient to be with degenerative changes lumbar spine with degenerative spondylolisthesis and focal protrusion of the disc into the inferior aspect of the left neural foramen at L4-5 with mild concentric bulging of the disc at L5-S1 and facet degenerative changes.. There is concern regarding significant component of patient's pain and due to intraspinal abnormalities of the lumbar region. The risks, benefits, and expectations of the procedure have been discussed and explained to the patient who was understanding and in agreement with suggested treatment plan. We will proceed with lumbar epidural steroid injection as discussed and as explained to the patient who is willing to proceed with procedure as planned.   DESCRIPTION OF PROCEDURE: Lumbar epidural steroid injection with EKG, blood pressure, pulse, and pulse oximetry monitoring. The procedure was performed with the patient in the prone position under fluoroscopic guidance. A local anesthetic skin wheal of 1.5% plain lidocaine was accomplished at proposed entry site. An 18-gauge Tuohy epidural needle was inserted at the L 4 vertebral body level right of the midline via loss-of-resistance technique with negative heme and negative CSF return. A total of 4 mL of Preservative-Free normal saline with 40 mg of Kenalog injected incrementally via epidurally placed needle. Needle was removed.   A total of 40 mg of Kenalog was utilized for the procedure.   The patient tolerated the injection well.    PLAN:   1. Medications: We will continue presently prescribed medications. 2. Will consider modification of  treatment regimen pending response to treatment rendered on today's visit and follow-up evaluation. 3. The patient is to follow-up with primary care physician Dr.Plonk  regarding blood pressure and general medical condition status post lumbar epidural steroid injection performed on today's visit. 4. Surgical evaluation May be considered pending follow-up evaluation  5. Neurological evaluation May consider PNCV/EMG studies and other studies pending follow-up evaluation  6. The patient may be a candidate for radiofrequency procedures, implantation device, and other treatment pending response to treatment and follow-up evaluation. 7. The patient has been advised to adhere to proper body mechanics and avoid activities which appear to aggravate condition. 8. The patient has been advised to call the Pain Management Center prior to scheduled return appointment should there be significant change in condition or should there be sign  The patient is understanding and agrees with the suggested  treatment plan     Review of Systems     Objective:   Physical Exam        Assessment & Plan:

## 2014-12-29 NOTE — Patient Instructions (Addendum)
PLAN   Continue present medication  F/U PCP Dr.Plonk   for evaliation of  BP and general medical  condition  F/U surgical evaluation. May consider pending follow-up evaluations  F/U neurological evaluation. May consider pending follow-up evaluations  May consider radiofrequency rhizolysis or intraspinal procedures pending response to present treatment and F/U evaluation   Patient to call Pain Management Center should patient have concerns prior to scheduled return appointment. Pain Management Discharge Instructions  General Discharge Instructions :  If you need to reach your doctor call: Monday-Friday 8:00 am - 4:00 pm at [phone] or toll free [phone].  After clinic hours [phone] to have operator reach doctor.  Bring all of your medication bottles to all your appointments in the pain clinic.  To cancel or reschedule your appointment with Pain Management please remember to call 24 hours in advance to avoid a fee.  Refer to the educational materials which you have been given on: General Risks, I had my Procedure. Discharge Instructions, Post Sedation.  Post Procedure Instructions:  The drugs you were given will stay in your system until tomorrow, so for the next 24 hours you should not drive, make any legal decisions or drink any alcoholic beverages.  You may eat anything you prefer, but it is better to start with liquids then soups and crackers, and gradually work up to solid foods.  Please notify your doctor immediately if you have any unusual bleeding, trouble breathing or pain that is not related to your normal pain.  Depending on the type of procedure that was done, some parts of your body may feel week and/or numb.  This usually clears up by tonight or the next day.  Walk with the use of an assistive device or accompanied by an adult for the 24 hours.  You may use ice on the affected area for the first 24 hours.  Put ice in a Ziploc bag and cover with a towel  and place against area 15 minutes on 15 minutes off.  You may switch to heat after 24 hours.Epidural Steroid Injection Patient Information  Description: The epidural space surrounds the nerves as they exit the spinal cord.  In some patients, the nerves can be compressed and inflamed by a bulging disc or a tight spinal canal (spinal stenosis).  By injecting steroids into the epidural space, we can bring irritated nerves into direct contact with a potentially helpful medication.  These steroids act directly on the irritated nerves and can reduce swelling and inflammation which often leads to decreased pain.  Epidural steroids may be injected anywhere along the spine and from the neck to the low back depending upon the location of your pain.   After numbing the skin with local anesthetic (like Novocaine), a small needle is passed into the epidural space slowly.  You may experience a sensation of pressure while this is being done.  The entire block usually last less than 10 minutes.  Conditions which may be treated by epidural steroids:   Low back and leg pain  Neck and arm pain  Spinal stenosis  Post-laminectomy syndrome  Herpes zoster (shingles) pain  Pain from compression fractures  Preparation for the injection:  1. Do not eat any solid food or dairy products within 6 hours of your appointment.  2. You may drink clear liquids up to 2 hours before appointment.  Clear liquids include water, black coffee, juice or soda.  No milk or cream please. 3. You may take your regular medication, including  pain medications, with a sip of water before your appointment  Diabetics should hold regular insulin (if taken separately) and take 1/2 normal NPH dos the morning of the procedure.  Carry some sugar containing items with you to your appointment. 4. A driver must accompany you and be prepared to drive you home after your procedure.  5. Bring all your current medications with your. 6. An IV may be  inserted and sedation may be given at the discretion of the physician.   7. A blood pressure cuff, EKG and other monitors will often be applied during the procedure.  Some patients may need to have extra oxygen administered for a short period. 8. You will be asked to provide medical information, including your allergies, prior to the procedure.  We must know immediately if you are taking blood thinners (like Coumadin/Warfarin)  Or if you are allergic to IV iodine contrast (dye). We must know if you could possible be pregnant.  Possible side-effects:  Bleeding from needle site  Infection (rare, may require surgery)  Nerve injury (rare)  Numbness & tingling (temporary)  Difficulty urinating (rare, temporary)  Spinal headache ( a headache worse with upright posture)  Light -headedness (temporary)  Pain at injection site (several days)  Decreased blood pressure (temporary)  Weakness in arm/leg (temporary)  Pressure sensation in back/neck (temporary)  Call if you experience:  Fever/chills associated with headache or increased back/neck pain.  Headache worsened by an upright position.  New onset weakness or numbness of an extremity below the injection site  Hives or difficulty breathing (go to the emergency room)  Inflammation or drainage at the infection site  Severe back/neck pain  Any new symptoms which are concerning to you  Please note:  Although the local anesthetic injected can often make your back or neck feel good for several hours after the injection, the pain will likely return.  It takes 3-7 days for steroids to work in the epidural space.  You may not notice any pain relief for at least that one week.  If effective, we will often do a series of three injections spaced 3-6 weeks apart to maximally decrease your pain.  After the initial series, we generally will wait several months before considering a repeat injection of the same type.  If you have any  questions, please call [phone] [facility]

## 2014-12-29 NOTE — Progress Notes (Signed)
Safety precautions to be maintained throughout the outpatient stay will include: orient to surroundings, keep bed in low position, maintain call bell within reach at all times, provide assistance with transfer out of bed and ambulation.  

## 2014-12-30 ENCOUNTER — Telehealth: Payer: Self-pay | Admitting: *Deleted

## 2014-12-30 NOTE — Telephone Encounter (Signed)
No problems post procedure phone call. 

## 2015-01-10 ENCOUNTER — Ambulatory Visit: Payer: BLUE CROSS/BLUE SHIELD | Admitting: Family Medicine

## 2015-01-11 ENCOUNTER — Ambulatory Visit: Payer: BLUE CROSS/BLUE SHIELD | Admitting: Family Medicine

## 2015-01-27 ENCOUNTER — Ambulatory Visit: Payer: BLUE CROSS/BLUE SHIELD | Admitting: Pain Medicine

## 2015-02-03 ENCOUNTER — Ambulatory Visit: Payer: BLUE CROSS/BLUE SHIELD | Admitting: Pain Medicine

## 2015-03-24 ENCOUNTER — Emergency Department
Admission: EM | Admit: 2015-03-24 | Discharge: 2015-03-24 | Disposition: A | Discharge status: Home / Self Care | Payer: BLUE CROSS/BLUE SHIELD | Attending: Emergency Medicine | Admitting: Emergency Medicine

## 2015-03-24 ENCOUNTER — Encounter: Payer: Self-pay | Admitting: Emergency Medicine

## 2015-03-24 DIAGNOSIS — R1310 Dysphagia, unspecified: Secondary | ICD-10-CM | POA: Insufficient documentation

## 2015-03-24 DIAGNOSIS — I10 Essential (primary) hypertension: Secondary | ICD-10-CM | POA: Insufficient documentation

## 2015-03-24 DIAGNOSIS — Y9389 Activity, other specified: Secondary | ICD-10-CM | POA: Insufficient documentation

## 2015-03-24 DIAGNOSIS — Y9289 Other specified places as the place of occurrence of the external cause: Secondary | ICD-10-CM | POA: Insufficient documentation

## 2015-03-24 DIAGNOSIS — Y998 Other external cause status: Secondary | ICD-10-CM | POA: Insufficient documentation

## 2015-03-24 DIAGNOSIS — X58XXXA Exposure to other specified factors, initial encounter: Secondary | ICD-10-CM | POA: Diagnosis not present

## 2015-03-24 DIAGNOSIS — Z79899 Other long term (current) drug therapy: Secondary | ICD-10-CM | POA: Diagnosis not present

## 2015-03-24 DIAGNOSIS — E1340 Other specified diabetes mellitus with diabetic neuropathy, unspecified: Secondary | ICD-10-CM | POA: Diagnosis not present

## 2015-03-24 DIAGNOSIS — R0602 Shortness of breath: Secondary | ICD-10-CM | POA: Diagnosis not present

## 2015-03-24 DIAGNOSIS — Z91013 Allergy to seafood: Secondary | ICD-10-CM | POA: Insufficient documentation

## 2015-03-24 DIAGNOSIS — T783XXA Angioneurotic edema, initial encounter: Secondary | ICD-10-CM | POA: Diagnosis not present

## 2015-03-24 DIAGNOSIS — Z7984 Long term (current) use of oral hypoglycemic drugs: Secondary | ICD-10-CM | POA: Diagnosis not present

## 2015-03-24 DIAGNOSIS — T781XXA Other adverse food reactions, not elsewhere classified, initial encounter: Secondary | ICD-10-CM | POA: Diagnosis present

## 2015-03-24 MED ORDER — FAMOTIDINE IN NACL 20-0.9 MG/50ML-% IV SOLN
20.0000 mg | Freq: Once | INTRAVENOUS | Status: AC
  Administered 2015-03-24: 20 mg via INTRAVENOUS
  Filled 2015-03-24: qty 50

## 2015-03-24 MED ORDER — METHYLPREDNISOLONE SODIUM SUCC 125 MG IJ SOLR
125.0000 mg | Freq: Once | INTRAMUSCULAR | Status: AC
  Administered 2015-03-24: 125 mg via INTRAVENOUS
  Filled 2015-03-24: qty 2

## 2015-03-24 MED ORDER — AMLODIPINE BESYLATE 10 MG PO TABS: 10.0000 mg | ORAL_TABLET | Freq: Every day | ORAL | Status: DC

## 2015-03-24 MED ORDER — DIPHENHYDRAMINE HCL 50 MG/ML IJ SOLN
25.0000 mg | Freq: Once | INTRAMUSCULAR | Status: AC
  Administered 2015-03-24: 25 mg via INTRAVENOUS
  Filled 2015-03-24: qty 1

## 2015-03-24 MED ORDER — EPINEPHRINE 0.3 MG/0.3ML IJ SOAJ: 0.3000 mg | Freq: Once | INTRAMUSCULAR | Status: DC

## 2015-03-24 NOTE — ED Notes (Signed)
Previous allergic rxt to shellfish ("a long time ago") - states believes she is having an allergic rxt to fish. Took benadryl this am. vss

## 2015-03-24 NOTE — ED Provider Notes (Signed)
[facility] Emergency Department Provider Note  ____________________________________________  Time seen: Approximately 12:04 PM  I have reviewed the triage vital signs and the nursing notes.   HISTORY  Chief Complaint Allergic Reaction    HPI Lisa Galvan is a 54 y.o. female with a history of diabetes on metformin, hypertension, obesity, GERD, and hyperlipidemia as well as a known allergy to shellfish who presents with what she believes are allergic symptoms after being exposed to shellfish last night.  She states that she was cleaning some other fish that there was some shrimp around last nightin the kitchen.  She was not having any trouble at the time but when she awoke this morning she felt somewhat short of breath and tight in her throat and was having difficulty swallowing.  She felt that she was not able to take her morning pills because her throat felt too tight.  She is handling her own secretions.  She took 1 teaspoon (12.5 mg) of liquid Benadryl and she felt that it "loosen that up".  She still does not feel normal, however, and took another teaspoon which again helped but has not resolved the issue.  She currently describes the symptoms as moderate but is denying chest pain and shortness of breath.  She states that most of the trouble is feeling that her throat is full and tight and that she cannot swallow solids.  She continues to have no trouble handling her secretions and she is drinking liquids okay.  She has no respiratory difficulties, no wheezing, no stridor.  The Benadryl made the symptoms better and nothing seems to make it worse.  She states that she has washed everything around her including herself so she should have no ongoing exposure.   Past Medical History  Diagnosis Date  . Diabetes mellitus without complication (HCC)   . Hypertension   . GERD (gastroesophageal reflux disease)   . Hyperlipidemia     Patient Active Problem List   Diagnosis Date Noted  . DDD (degenerative disc disease), lumbar 12/16/2014  . Facet syndrome, lumbar 12/16/2014  . Sacroiliac joint dysfunction 12/16/2014  . Neuropathy due to secondary diabetes (HCC) 12/16/2014  . Chest pain 12/15/2014  . Combined fat and carbohydrate induced hyperlipemia 12/15/2014  . Vitamin D deficiency 12/13/2014  . Obesity (BMI 30.0-34.9) 12/10/2014  . Chronic sciatica 12/10/2014  . Essential hypertension 12/10/2014  . Type 2 diabetes mellitus without complication (HCC) 12/10/2014  . Hyperlipidemia 12/10/2014  . Esophageal reflux 12/10/2014    Past Surgical History  Procedure Laterality Date  . Abdominal hysterectomy      Current Outpatient Rx  Name  Route  Sig  Dispense  Refill  . amitriptyline (ELAVIL) 50 MG tablet   Oral   Take 50 mg by mouth daily.      1   . atorvastatin (LIPITOR) 20 MG tablet   Oral   Take 20 mg by mouth daily.         . canagliflozin (INVOKANA) 300 MG TABS tablet   Oral   Take 300 mg by mouth daily before breakfast.         . escitalopram (LEXAPRO) 20 MG tablet   Oral   Take 20 mg by mouth daily.      0   . metFORMIN (GLUCOPHAGE) 500 MG tablet   Oral   Take 1,000 mg by mouth 2 (two) times daily with a meal.         . pantoprazole (PROTONIX) 40 MG tablet  Oral   Take 40 mg by mouth 2 (two) times daily.         Marland Kitchen PATADAY 0.2 % SOLN   Both Eyes   Place 1 drop into both eyes daily.      2     Dispense as written.   . pioglitazone (ACTOS) 30 MG tablet   Oral   Take 30 mg by mouth daily.      1   . triamterene-hydrochlorothiazide (DYAZIDE) 37.5-25 MG per capsule   Oral   Take 1 capsule by mouth daily.         Marland Kitchen VITAMIN D, CHOLECALCIFEROL, PO   Oral   Take 1 capsule by mouth daily.         Marland Kitchen amLODipine (NORVASC) 10 MG tablet   Oral   Take 1 tablet (10 mg total) by mouth daily.   30 tablet   1   . EPINEPHrine (EPIPEN 2-PAK) 0.3 mg/0.3 mL IJ SOAJ injection   Intramuscular   Inject 0.3  mLs (0.3 mg total) into the muscle once. Take for severe allergic reaction, then come immediately to the Emergency Department or call 911.   1 Device   0     Allergies Shellfish allergy  Family History  Problem Relation Age of Onset  . Diabetes Mother   . Cancer Mother     breast and stomach  . Heart disease Father   . Kidney disease Father     dialysis  . Cancer Father     colon    Social History Social History  Substance Use Topics  . Smoking status: Never Smoker   . Smokeless tobacco: None  . Alcohol Use: 0.0 oz/week    0 Standard drinks or equivalent per week    Review of Systems Constitutional: No fever/chills Eyes: No visual changes. ENT: No sore throat.  Tight throat with some dysphagia Cardiovascular: Denies chest pain. Respiratory: Mild shortness of breath this morning which has since resolved after Benadryl, currently no shortness of breath.  No wheezing. Gastrointestinal: No abdominal pain.  No nausea, no vomiting.  No diarrhea.  No constipation. Genitourinary: Negative for dysuria. Musculoskeletal: Negative for back pain. Skin: Negative for rash. Neurological: Negative for headaches, focal weakness or numbness.  10-point ROS otherwise negative.  ____________________________________________   PHYSICAL EXAM:  VITAL SIGNS: ED Triage Vitals  Enc Vitals Group     BP 03/24/15 1117 132/98 mmHg     Pulse Rate 03/24/15 1117 104     Resp 03/24/15 1117 20     Temp 03/24/15 1117 98.2 F (36.8 C)     Temp Source 03/24/15 1117 Oral     SpO2 03/24/15 1117 99 %     Weight 03/24/15 1117 185 lb (83.915 kg)     Height 03/24/15 1117 5\' 3"  (1.6 m)     Head Cir --      Peak Flow --      Pain Score 03/24/15 1118 0     Pain Loc --      Pain Edu? --      Excl. in GC? --     Constitutional: Alert and oriented. Well appearing and in no acute distress. Eyes: Conjunctivae are normal. PERRL. EOMI. Head: Atraumatic. Nose: No congestion/rhinnorhea. Mouth/Throat:  Mucous membranes are moist.  Oropharynx non-erythematous.  I do not appreciate any induration or swelling upon my visual examination of the patient's oropharynx and palpation of her neck and submandibular region. Neck: No stridor.   Cardiovascular: Normal rate,  regular rhythm. Grossly normal heart sounds.  Good peripheral circulation. Respiratory: Normal respiratory effort.  No retractions. Lungs CTAB. Gastrointestinal: Soft and nontender. No distention. No abdominal bruits. No CVA tenderness. Musculoskeletal: No lower extremity tenderness nor edema.  No joint effusions. Neurologic:  Normal speech and language. No gross focal neurologic deficits are appreciated.  Skin:  Skin is warm, dry and intact. No rash noted. Psychiatric: Mood and affect are normal. Speech and behavior are normal.  ____________________________________________   LABS (all labs ordered are listed, but only abnormal results are displayed)  Labs Reviewed - No data to display ____________________________________________  EKG  ED ECG REPORT I, Kimblery Diop, the attending physician, personally viewed and interpreted this ECG.  Date: 03/24/2015 EKG Time: 12:54 Rate: 81 Rhythm: normal sinus rhythm QRS Axis: normal Intervals: normal ST/T Wave abnormalities: Inverted T-wave in lead 3, otherwise unremarkable Conduction Disutrbances: none Narrative Interpretation: unremarkable  ____________________________________________  RADIOLOGY   No results found.  ____________________________________________   PROCEDURES  Procedure(s) performed: None  Critical Care performed: No ____________________________________________   INITIAL IMPRESSION / ASSESSMENT AND PLAN / ED COURSE  Pertinent labs & imaging results that were available during my care of the patient were reviewed by me and considered in my medical decision making (see chart for details).  The patient firmly believes that her symptoms are result of an  exposure last night, and states that it feels the same as it has in the past when she has been exposed to shellfish.  The time course does not make total sense; she was not symptomatic until this morning but the exposure was last night.  I will obtain an EKG to look for any gross abnormalities that may explain some mild shortness of breath but the possibility is so low that we would see any lab abnormalities that I will not obtain unnecessary lab work at this time.  Given that it is most likely allergic, I will treat her with a dose of Solu-Medrol and IV Benadryl 25 mg and continue to observe her.  I see no external evidence of angioedema at this time, but her history of present illness is actually more consistent with posterior angioedema than a reaction to shellfish.  She does take a combination amlodipine and benazepril; it is theoretically possible that she is experiencing some angioedema due to her ACE inhibitor, and I have cautioned her that perhaps she should not take any additional Lotrel until she follows up with her primary care doctor given that this may be medication related and not related to the shrimp exposure last night.  I have also cautioned her that her blood sugar will be elevated in the setting of Solu-Medrol in the course of prednisone and a plan to provide for her.  ----------------------------------------- 1:13 PM on 03/24/2015 -----------------------------------------  Stable, no changes.  ----------------------------------------- 4:10 PM on 03/24/2015 -----------------------------------------  Patient has improved over 5 hours in the emergency department.  She has been able to drink and swallow without any difficulty and states that she subjectively feels better as well.  I iterated precautions about angioedema, and I also prescribed her EpiPen's just in case.  I gave my usual and customary return precautions.     ____________________________________________  FINAL CLINICAL  IMPRESSION(S) / ED DIAGNOSES  Final diagnoses:  Angioedema, initial encounter  Seafood allergy      Discontinued Medications   AMLODIPINE-BENAZEPRIL (LOTREL) 10-20 MG PER CAPSULE    Take 1 capsule by mouth daily.        NEW MEDICATIONS  STARTED DURING THIS VISIT:  New Prescriptions   AMLODIPINE (NORVASC) 10 MG TABLET    Take 1 tablet (10 mg total) by mouth daily.   EPINEPHRINE (EPIPEN 2-PAK) 0.3 MG/0.3 ML IJ SOAJ INJECTION    Inject 0.3 mLs (0.3 mg total) into the muscle once. Take for severe allergic reaction, then come immediately to the Emergency Department or call 911.     Loleta Rose, MD 03/24/15 [phone]

## 2015-03-24 NOTE — ED Notes (Signed)
Awoke from slleep with feelings of difficulty breathing, tightness in the throat, has allergies to shellfish and was cleaning catfish lastnight with some shrimp present. , took benadryl this AM with little relieve

## 2015-03-24 NOTE — Discharge Instructions (Signed)
As we discussed, our concern is that your difficulty breathing following today may not have been a result of a shellfish allergy, but rather a medication allergy reaction to the benazepril component of your blood pressure medication, a potentially dangerous/life-threatening condition called angioedema.  Because you are stable and improved from when you arrived 5 hours ago, we will discharge you, but we strongly encourage you to not take any additional Lotrel, at least until you can follow-up with your regular doctor and discuss this.  We prescribed to amlodipine by itself which should be safe.  We also prescribed for you an EpiPen which you can inject into your thigh if you are developing a severe allergic reaction and having difficulty breathing, but a few use this, you still must come directly to the emergency department.  Return to the emergency department if you develop any new or worsening symptoms that concern you, and follow up with your primary care doctor at the next available opportunity.   Angioedema Angioedema is a sudden swelling of tissues, often of the skin. It can occur on the face or genitals or in the abdomen or other body parts. The swelling usually develops over a short period and gets better in 24 to 48 hours. It often begins during the night and is found when the person wakes up. The person may also get red, itchy patches of skin (hives). Angioedema can be dangerous if it involves swelling of the air passages.  Depending on the cause, episodes of angioedema may only happen once, come back in unpredictable patterns, or repeat for several years and then gradually fade away.  CAUSES  Angioedema can be caused by an allergic reaction to various triggers. It can also result from nonallergic causes, including reactions to drugs, immune system disorders, viral infections, or an abnormal gene that is passed to you from your parents (hereditary). For some people with angioedema, the cause is  unknown.  Some things that can trigger angioedema include:   Foods.   Medicines, such as ACE inhibitors, ARBs, nonsteroidal anti-inflammatory agents, or estrogen.   Latex.   Animal saliva.   Insect stings.   Dyes used in X-rays.   Mild injury.   Dental work.  Surgery.  Stress.   Sudden changes in temperature.   Exercise. SIGNS AND SYMPTOMS   Swelling of the skin.  Hives. If these are present, there is also intense itching.  Redness in the affected area.   Pain in the affected area.  Swollen lips or tongue.  Breathing problems. This may happen if the air passages swell.  Wheezing. If internal organs are involved, there may be:   Nausea.   Abdominal pain.   Vomiting.   Difficulty swallowing.   Difficulty passing urine. DIAGNOSIS   Your health care provider will examine the affected area and take a medical and family history.  Various tests may be done to help determine the cause. Tests may include:  Allergy skin tests to see if the problem is an allergic reaction.   Blood tests to check for hereditary angioedema.   Tests to check for underlying diseases that could cause the condition.   A review of your medicines, including over-the-counter medicines, may be done. TREATMENT  Treatment will depend on the cause of the angioedema. Possible treatments include:   Removal of anything that triggered the condition (such as stopping certain medicines).   Medicines to treat symptoms or prevent attacks. Medicines given may include:   Antihistamines.   Epinephrine injection.  Steroids.   Hospitalization may be required for severe attacks. If the air passages are affected, it can be an emergency. Tubes may need to be placed to keep the airway open. HOME CARE INSTRUCTIONS   Take all medicines as directed by your health care provider.  If you were given medicines for emergency allergy treatment, always carry them with  you.  Wear a medical bracelet as directed by your health care provider.   Avoid known triggers. SEEK MEDICAL CARE IF:   You have repeat attacks of angioedema.   Your attacks are more frequent or more severe despite preventive measures.   You have hereditary angioedema and are considering having children. It is important to discuss with your health care provider the risks of passing the condition on to your children. SEEK IMMEDIATE MEDICAL CARE IF:   You have severe swelling of the mouth, tongue, or lips.  You have difficulty breathing.   You have difficulty swallowing.   You faint. MAKE SURE YOU:  Understand these instructions.  Will watch your condition.  Will get help right away if you are not doing well or get worse.   This information is not intended to replace advice given to you by your health care provider. Make sure you discuss any questions you have with your health care provider.   Document Released: 06/18/2001 Document Revised: 04/30/2014 Document Reviewed: 12/01/2012 Elsevier Interactive Patient Education 2016 [place] An allergy is an abnormal reaction to a substance by the body's defense system (immune system). Allergies can develop at any age. WHAT CAUSES ALLERGIES? An allergic reaction happens when the immune system mistakenly reacts to a normally harmless substance, called an allergen, as if it were harmful. The immune system releases antibodies to fight the substance. Antibodies eventually release a chemical called histamine into the bloodstream. The release of histamine is meant to protect the body from infection, but it also causes discomfort. An allergic reaction can be triggered by:  Eating an allergen.  Inhaling an allergen.  Touching an allergen. WHAT TYPES OF ALLERGIES ARE THERE? There are many types of allergies. Common types include:  Seasonal allergies. People with this type of allergy are usually allergic to substances  that are only present during certain seasons, such as molds and pollens.  Food allergies.  Drug allergies.  Insect allergies.  Animal dander allergies. WHAT ARE SYMPTOMS OF ALLERGIES? Possible allergy symptoms include:  Swelling of the lips, face, tongue, mouth, or throat.  Sneezing, coughing, or wheezing.  Nasal congestion.  Tingling in the mouth.  Rash.  Itching.  Itchy, red, swollen areas of skin (hives).  Watery eyes.  Vomiting.  Diarrhea.  Dizziness.  Lightheadedness.  Fainting.  Trouble breathing or swallowing.  Chest tightness.  Rapid heartbeat. HOW ARE ALLERGIES DIAGNOSED? Allergies are diagnosed with a medical and family history and one or more of the following:  Skin tests.  Blood tests.  A food diary. A food diary is a record of all the foods and drinks you have in a day and of all the symptoms you experience.  The results of an elimination diet. An elimination diet involves eliminating foods from your diet and then adding them back in one by one to find out if a certain food causes an allergic reaction. HOW ARE ALLERGIES TREATED? There is no cure for allergies, but allergic reactions can be treated with medicine. Severe reactions usually need to be treated at a hospital. HOW CAN REACTIONS BE PREVENTED? The best way to prevent  an allergic reaction is by avoiding the substance you are allergic to. Allergy shots and medicines can also help prevent reactions in some cases. People with severe allergic reactions may be able to prevent a life-threatening reaction called anaphylaxis with a medicine given right after exposure to the allergen.   This information is not intended to replace advice given to you by your health care provider. Make sure you discuss any questions you have with your health care provider.   Document Released: 07/03/2002 Document Revised: 04/30/2014 Document Reviewed: 01/19/2014 Elsevier Interactive Patient Education 2016  [employer]   Epinephrine Injection Epinephrine is a medicine given by injection to temporarily treat an emergency allergic reaction. It is also used to treat severe asthmatic attacks and other lung problems. The medicine helps to enlarge (dilate) the small breathing tubes of the lungs. A life-threatening, sudden allergic reaction that involves the whole body is called anaphylaxis. Because of potential side effects, epinephrine should only be used as directed by your caregiver. RISKS AND COMPLICATIONS Possible side effects of epinephrine injections include:  Chest pain.  Irregular or rapid heartbeat.  Shortness of breath.  Nausea.  Vomiting.  Abdominal pain or cramping.  Sweating.  Dizziness.  Weakness.  Headache.  Nervousness. Report all side effects to your caregiver. HOW TO GIVE AN EPINEPHRINE INJECTION Give the epinephrine injection immediately when symptoms of a severe reaction begin. Inject the medicine into the outer thigh or any available, large muscle. Your caregiver can teach you how to do this. You do not need to remove any clothing. After the injection, call your local emergency services (911 in [place] Even if you improve after the injection, you need to be examined at a hospital emergency department. Epinephrine works quickly, but it also wears off quickly. Delayed reactions can occur. A delayed reaction may be as serious and dangerous as the initial reaction. HOME CARE INSTRUCTIONS  Make sure you and your family know how to give an epinephrine injection.  Use epinephrine injections as directed by your caregiver. Do not use this medicine more often or in larger doses than prescribed.  Always carry your epinephrine injection or anaphylaxis kit with you. This can be lifesaving if you have a severe reaction.  Store the medicine in a cool, dry place. If the medicine becomes discolored or cloudy, dispose of it properly and replace it with new medicine.  Check the  expiration date on your medicine. It may be unsafe to use medicines past their expiration date.  Tell your caregiver about any other medicines you are taking. Some medicines can react badly with epinephrine.  Tell your caregiver about any medical conditions you have, such as diabetes, high blood pressure (hypertension), heart disease, irregular heartbeats, or if you are pregnant. SEEK IMMEDIATE MEDICAL CARE IF:  You have used an epinephrine injection. Call your local emergency services (911 in [place] Even if you improve after the injection, you need to be examined at a hospital emergency department to make sure your allergic reaction is under control. You will also be monitored for adverse effects from the medicine.  You have chest pain.  You have irregular or fast heartbeats.  You have shortness of breath.  You have severe headaches.  You have severe nausea, vomiting, or abdominal cramps.  You have severe pain, swelling, or redness in the area where you gave the injection.   This information is not intended to replace advice given to you by your health care provider. Make sure you discuss any questions you  have with your health care provider.   Document Released: 04/06/2000 Document Revised: 07/02/2011 Document Reviewed: 10/27/2014 Elsevier Interactive Patient Education [employer].

## 2015-05-19 ENCOUNTER — Ambulatory Visit (INDEPENDENT_AMBULATORY_CARE_PROVIDER_SITE_OTHER): Payer: BLUE CROSS/BLUE SHIELD

## 2015-05-19 DIAGNOSIS — Z23 Encounter for immunization: Secondary | ICD-10-CM | POA: Diagnosis not present

## 2016-02-16 ENCOUNTER — Ambulatory Visit (INDEPENDENT_AMBULATORY_CARE_PROVIDER_SITE_OTHER): Payer: BLUE CROSS/BLUE SHIELD

## 2016-02-16 DIAGNOSIS — Z23 Encounter for immunization: Secondary | ICD-10-CM

## 2016-06-05 ENCOUNTER — Ambulatory Visit (INDEPENDENT_AMBULATORY_CARE_PROVIDER_SITE_OTHER): Payer: BLUE CROSS/BLUE SHIELD | Admitting: Family Medicine

## 2016-06-05 ENCOUNTER — Encounter: Payer: Self-pay | Admitting: Family Medicine

## 2016-06-05 ENCOUNTER — Other Ambulatory Visit: Payer: Self-pay | Admitting: Family Medicine

## 2016-06-05 VITALS — BP 124/72 | HR 94 | Ht 63.0 in | Wt 188.0 lb

## 2016-06-05 DIAGNOSIS — E119 Type 2 diabetes mellitus without complications: Secondary | ICD-10-CM

## 2016-06-05 DIAGNOSIS — M7989 Other specified soft tissue disorders: Secondary | ICD-10-CM

## 2016-06-05 DIAGNOSIS — I10 Essential (primary) hypertension: Secondary | ICD-10-CM

## 2016-06-05 DIAGNOSIS — E785 Hyperlipidemia, unspecified: Secondary | ICD-10-CM | POA: Diagnosis not present

## 2016-06-05 DIAGNOSIS — K219 Gastro-esophageal reflux disease without esophagitis: Secondary | ICD-10-CM | POA: Diagnosis not present

## 2016-06-05 DIAGNOSIS — L83 Acanthosis nigricans: Secondary | ICD-10-CM

## 2016-06-05 MED ORDER — PIOGLITAZONE HCL 30 MG PO TABS: 30.0000 mg | ORAL_TABLET | Freq: Every day | ORAL | 1 refills | Status: DC

## 2016-06-05 MED ORDER — METFORMIN HCL 500 MG PO TABS: 500.0000 mg | ORAL_TABLET | Freq: Two times a day (BID) | ORAL | 1 refills | Status: DC

## 2016-06-05 MED ORDER — EMPAGLIFLOZIN 10 MG PO TABS: 10.0000 mg | ORAL_TABLET | Freq: Every day | ORAL | 1 refills | Status: DC

## 2016-06-05 MED ORDER — AMLODIPINE BESYLATE 5 MG PO TABS: 5.0000 mg | ORAL_TABLET | Freq: Every day | ORAL | 1 refills | Status: DC

## 2016-06-05 MED ORDER — PANTOPRAZOLE SODIUM 40 MG PO TBEC: 40.0000 mg | DELAYED_RELEASE_TABLET | Freq: Two times a day (BID) | ORAL | 1 refills | Status: DC

## 2016-06-05 MED ORDER — ATORVASTATIN CALCIUM 20 MG PO TABS: 20.0000 mg | ORAL_TABLET | Freq: Every day | ORAL | 1 refills | Status: DC

## 2016-06-05 MED ORDER — TRIAMTERENE-HCTZ 37.5-25 MG PO TABS: 0.5000 | ORAL_TABLET | Freq: Every day | ORAL | 1 refills | Status: DC

## 2016-06-05 NOTE — Progress Notes (Signed)
Name: Lisa Galvan   MRN: [id number]    DOB: [birth year]   Date:06/05/2016       Progress Note  Subjective  Chief Complaint  Chief Complaint  Patient presents with  . Diabetes  . Gastroesophageal Reflux  . Hypertension    Diabetes  She presents for her follow-up diabetic visit. She has type 2 diabetes mellitus. Her disease course has been stable. Pertinent negatives for hypoglycemia include no confusion, dizziness, headaches, hunger, mood changes, nervousness/anxiousness, pallor, seizures, sleepiness, speech difficulty, sweats or tremors. Associated symptoms include polydipsia. Pertinent negatives for diabetes include no blurred vision, no chest pain, no fatigue, no foot paresthesias, no foot ulcerations, no polyphagia, no polyuria, no visual change, no weakness and no weight loss. (Nocturia) Pertinent negatives for hypoglycemia complications include no blackouts, no hospitalization, no nocturnal hypoglycemia and no required assistance. Symptoms are stable. Pertinent negatives for diabetic complications include no autonomic neuropathy, CVA, heart disease, impotence, nephropathy, peripheral neuropathy, PVD or retinopathy. Risk factors for coronary artery disease include dyslipidemia, diabetes mellitus and hypertension. Current diabetic treatment includes oral agent (triple therapy). She is compliant with treatment all of the time. Her weight is stable. She is following a generally healthy diet. She participates in exercise intermittently. Her home blood glucose trend is fluctuating minimally. Her breakfast blood glucose is taken between 8-9 am. Her breakfast blood glucose range is generally 110-130 mg/dl. She does not see a podiatrist.Eye exam is not current.  Gastroesophageal Reflux  She complains of water brash. She reports no abdominal pain, no belching, no chest pain, no choking, no coughing, no dysphagia, no globus sensation, no heartburn, no hoarse voice, no nausea, no sore throat, no stridor or  no wheezing. This is a chronic problem. The problem has been waxing and waning. The symptoms are aggravated by certain foods. Pertinent negatives include no anemia, fatigue, melena, muscle weakness, orthopnea or weight loss. There are no known risk factors. She has tried a PPI for the symptoms. The treatment provided mild relief.  Hypertension  This is a chronic problem. The current episode started more than 1 year ago. The problem has been waxing and waning since onset. Pertinent negatives include no anxiety, blurred vision, chest pain, headaches, malaise/fatigue, neck pain, orthopnea, palpitations, peripheral edema, PND, shortness of breath or sweats. There are no associated agents to hypertension. Past treatments include ACE inhibitors, calcium channel blockers and diuretics. The current treatment provides mild improvement. There are no compliance problems.  There is no history of CVA, PVD or retinopathy.  Hyperlipidemia  This is a chronic problem. The current episode started more than 1 year ago. Recent lipid tests were reviewed and are normal. Factors aggravating her hyperlipidemia include thiazides. Pertinent negatives include no chest pain, focal sensory loss, leg pain or shortness of breath. Current antihyperlipidemic treatment includes statins. The current treatment provides moderate improvement of lipids. There are no compliance problems.     No problem-specific Assessment & Plan notes found for this encounter.   Past Medical History:  Diagnosis Date  . Diabetes mellitus without complication (HCC)   . GERD (gastroesophageal reflux disease)   . Hyperlipidemia   . Hypertension     Past Surgical History:  Procedure Laterality Date  . ABDOMINAL HYSTERECTOMY      Family History  Problem Relation Age of Onset  . Diabetes Mother   . Cancer Mother     breast and stomach  . Heart disease Father   . Kidney disease Father     dialysis  . Cancer  Father     colon    Social History    Social History  . Marital status: Single    Spouse name: N/A  . Number of children: N/A  . Years of education: N/A   Occupational History  . Not on file.   Social History Main Topics  . Smoking status: Never Smoker  . Smokeless tobacco: Never Used  . Alcohol use 0.0 oz/week  . Drug use: No  . Sexual activity: Not on file   Other Topics Concern  . Not on file   Social History Narrative  . No narrative on file    Allergies  Allergen Reactions  . Other Anaphylaxis  . Ace Inhibitors Swelling  . Shellfish Allergy Swelling     Review of Systems  Constitutional: Negative for fatigue, malaise/fatigue and weight loss.  HENT: Negative for hoarse voice and sore throat.   Eyes: Negative for blurred vision.  Respiratory: Negative for cough, choking, shortness of breath and wheezing.   Cardiovascular: Negative for chest pain, palpitations, orthopnea and PND.  Gastrointestinal: Negative for abdominal pain, dysphagia, heartburn, melena and nausea.  Genitourinary: Negative for impotence.  Musculoskeletal: Negative for muscle weakness and neck pain.  Skin: Negative for pallor.  Neurological: Negative for dizziness, tremors, seizures, speech difficulty, weakness and headaches.  Endo/Heme/Allergies: Positive for polydipsia. Negative for polyphagia.  Psychiatric/Behavioral: Negative for confusion. The patient is not nervous/anxious.      Objective  Vitals:   06/05/16 1413  BP: 124/72  Pulse: 94  Weight: 188 lb (85.3 kg)  Height: 5\' 3"  (1.6 m)    Physical Exam  Constitutional: She is well-developed, well-nourished, and in no distress. No distress.  HENT:  Head: Normocephalic and atraumatic.  Right Ear: External ear normal.  Left Ear: External ear normal.  Nose: Nose normal.  Mouth/Throat: Oropharynx is clear and moist.  Eyes: Conjunctivae and EOM are normal. Pupils are equal, round, and reactive to light. Right eye exhibits no discharge. Left eye exhibits no discharge.   Neck: Normal range of motion. Neck supple. No JVD present. No thyromegaly present.  Cardiovascular: Normal rate, regular rhythm, normal heart sounds and intact distal pulses.  Exam reveals no gallop and no friction rub.   No murmur heard. Pulmonary/Chest: Effort normal and breath sounds normal. She has no wheezes. She has no rales. She exhibits no tenderness.  Abdominal: Soft. Bowel sounds are normal. She exhibits no mass. There is no tenderness. There is no rebound and no guarding.  Musculoskeletal: Normal range of motion. She exhibits no edema.  Lymphadenopathy:    She has no cervical adenopathy.  Neurological: She is alert. She has normal reflexes.  Skin: Skin is warm and dry. She is not diaphoretic.  Pigmentation dorsum feet  Psychiatric: Mood and affect normal.  Nursing note and vitals reviewed.     Assessment & Plan  Problem List Items Addressed This Visit      Cardiovascular and Mediastinum   Essential hypertension - Primary   Relevant Medications   amLODipine (NORVASC) 5 MG tablet   atorvastatin (LIPITOR) 20 MG tablet   triamterene-hydrochlorothiazide (MAXZIDE-25) 37.5-25 MG tablet   Other Relevant Orders   Renal Function Panel     Digestive   Esophageal reflux   Relevant Medications   pantoprazole (PROTONIX) 40 MG tablet     Endocrine   Type 2 diabetes mellitus without complication (HCC)   Relevant Medications   empagliflozin (JARDIANCE) 10 MG TABS tablet   atorvastatin (LIPITOR) 20 MG tablet   metFORMIN (  GLUCOPHAGE) 500 MG tablet   pioglitazone (ACTOS) 30 MG tablet   Other Relevant Orders   Hemoglobin A1c   Hepatic Function Panel (6)   Urine Microalbumin w/creat. ratio     Other   Hyperlipidemia   Relevant Medications   amLODipine (NORVASC) 5 MG tablet   atorvastatin (LIPITOR) 20 MG tablet   triamterene-hydrochlorothiazide (MAXZIDE-25) 37.5-25 MG tablet   Other Relevant Orders   Lipid panel    Other Visit Diagnoses    Acanthosis nigricans        Relevant Orders   Ambulatory referral to Dermatology   Leg swelling       amlodipine vs venous insufficiency   Relevant Medications   triamterene-hydrochlorothiazide (MAXZIDE-25) 37.5-25 MG tablet        Dr. Hayden [facility]  06/05/16

## 2016-06-06 LAB — MICROALBUMIN / CREATININE URINE RATIO
CREATININE, UR: 117.2 mg/dL
MICROALB/CREAT RATIO: 11 mg/g{creat} (ref 0.0–30.0)
MICROALBUM., U, RANDOM: 12.9 ug/mL

## 2016-06-06 LAB — LIPID PANEL
CHOL/HDL RATIO: 1.6 ratio (ref 0.0–4.4)
CHOLESTEROL TOTAL: 221 mg/dL — AB (ref 100–199)
HDL: 134 mg/dL (ref >39)
LDL Calculated: 72 mg/dL (ref 0–99)
TRIGLYCERIDES: 77 mg/dL (ref 0–149)
VLDL Cholesterol Cal: 15 mg/dL (ref 5–40)

## 2016-06-06 LAB — RENAL FUNCTION PANEL
ALBUMIN: 4.8 g/dL (ref 3.5–5.5)
BUN / CREAT RATIO: 14 (ref 9–23)
BUN: 10 mg/dL (ref 6–24)
CHLORIDE: 95 mmol/L — AB (ref 96–106)
CO2: 26 mmol/L (ref 18–29)
CREATININE: 0.74 mg/dL (ref 0.57–1.00)
Calcium: 10.3 mg/dL — ABNORMAL HIGH (ref 8.7–10.2)
GFR calc Af Amer: 104 mL/min/{1.73_m2} (ref >59)
GFR calc non Af Amer: 90 mL/min/{1.73_m2} (ref >59)
Glucose: 94 mg/dL (ref 65–99)
Phosphorus: 3.5 mg/dL (ref 2.5–4.5)
Potassium: 3.8 mmol/L (ref 3.5–5.2)
Sodium: 142 mmol/L (ref 134–144)

## 2016-06-06 LAB — HEPATIC FUNCTION PANEL (6)
ALK PHOS: 58 IU/L (ref 39–117)
ALT: 22 IU/L (ref 0–32)
AST: 35 IU/L (ref 0–40)
Bilirubin Total: 0.9 mg/dL (ref 0.0–1.2)
Bilirubin, Direct: 0.27 mg/dL (ref 0.00–0.40)

## 2016-06-06 LAB — HEMOGLOBIN A1C
Est. average glucose Bld gHb Est-mCnc: 143 mg/dL
HEMOGLOBIN A1C: 6.6 % — AB (ref 4.8–5.6)

## 2016-06-08 ENCOUNTER — Other Ambulatory Visit: Payer: Self-pay

## 2016-06-28 ENCOUNTER — Other Ambulatory Visit: Payer: Self-pay | Admitting: Family Medicine

## 2016-06-28 DIAGNOSIS — I10 Essential (primary) hypertension: Secondary | ICD-10-CM

## 2016-07-02 DIAGNOSIS — R600 Localized edema: Secondary | ICD-10-CM | POA: Insufficient documentation

## 2016-07-03 ENCOUNTER — Ambulatory Visit (INDEPENDENT_AMBULATORY_CARE_PROVIDER_SITE_OTHER): Payer: BLUE CROSS/BLUE SHIELD | Admitting: Family Medicine

## 2016-07-03 DIAGNOSIS — E785 Hyperlipidemia, unspecified: Secondary | ICD-10-CM | POA: Diagnosis not present

## 2016-07-03 DIAGNOSIS — M7989 Other specified soft tissue disorders: Secondary | ICD-10-CM | POA: Diagnosis not present

## 2016-07-03 DIAGNOSIS — K219 Gastro-esophageal reflux disease without esophagitis: Secondary | ICD-10-CM

## 2016-07-03 DIAGNOSIS — I10 Essential (primary) hypertension: Secondary | ICD-10-CM | POA: Diagnosis not present

## 2016-07-03 DIAGNOSIS — E119 Type 2 diabetes mellitus without complications: Secondary | ICD-10-CM

## 2016-07-03 MED ORDER — AMLODIPINE BESYLATE 5 MG PO TABS: ORAL_TABLET | ORAL | 1 refills | Status: DC

## 2016-07-03 MED ORDER — TRIAMTERENE-HCTZ 37.5-25 MG PO TABS: 0.5000 | ORAL_TABLET | Freq: Every day | ORAL | 1 refills | Status: DC

## 2016-07-03 MED ORDER — ATORVASTATIN CALCIUM 20 MG PO TABS: 20.0000 mg | ORAL_TABLET | Freq: Every day | ORAL | 1 refills | Status: DC

## 2016-07-03 MED ORDER — METFORMIN HCL 500 MG PO TABS: 500.0000 mg | ORAL_TABLET | Freq: Two times a day (BID) | ORAL | 1 refills | Status: DC

## 2016-07-03 MED ORDER — PANTOPRAZOLE SODIUM 40 MG PO TBEC: 40.0000 mg | DELAYED_RELEASE_TABLET | Freq: Two times a day (BID) | ORAL | 1 refills | Status: DC

## 2016-07-03 MED ORDER — EMPAGLIFLOZIN 10 MG PO TABS: 10.0000 mg | ORAL_TABLET | Freq: Every day | ORAL | 1 refills | Status: DC

## 2016-07-03 MED ORDER — PIOGLITAZONE HCL 30 MG PO TABS: 30.0000 mg | ORAL_TABLET | Freq: Every day | ORAL | 1 refills | Status: DC

## 2016-07-03 NOTE — Progress Notes (Signed)
Name: Lisa Galvan   MRN: [id number]    DOB: 31-Oct-1960   Date:07/03/2016       Progress Note  Subjective  Chief Complaint  Chief Complaint  Patient presents with  . Hypertension    decreased B/P med down by 5mg   . Diabetes    changed to Jardiance from Invokana    Hypertension  This is a chronic problem. The current episode started more than 1 year ago. The problem has been waxing and waning since onset. The problem is controlled. Pertinent negatives include no anxiety, blurred vision, chest pain, headaches, malaise/fatigue, neck pain, orthopnea, palpitations, peripheral edema, PND, shortness of breath or sweats. There are no associated agents to hypertension. Risk factors for coronary artery disease include dyslipidemia and diabetes mellitus. Past treatments include diuretics and calcium channel blockers. The current treatment provides mild improvement. There is no history of angina, kidney disease, CAD/MI, CVA, heart failure, left ventricular hypertrophy, PVD or retinopathy. There is no history of chronic renal disease.  Diabetes  She has type 2 diabetes mellitus. Her disease course has been stable. Pertinent negatives for hypoglycemia include no dizziness, headaches, nervousness/anxiousness or sweats. Pertinent negatives for diabetes include no blurred vision, no chest pain, no fatigue, no foot paresthesias, no foot ulcerations, no polydipsia, no polyphagia, no polyuria, no visual change, no weakness and no weight loss. Symptoms are improving. Pertinent negatives for diabetic complications include no CVA, PVD or retinopathy. There are no known risk factors for coronary artery disease. Her weight is stable. She is following a generally healthy diet. Meal planning includes carbohydrate counting and avoidance of concentrated sweets. She participates in exercise daily. Her home blood glucose trend is decreasing steadily. Her breakfast blood glucose is taken between 8-9 am. Her breakfast blood glucose  range is generally 90-110 mg/dl. An ACE inhibitor/angiotensin II receptor blocker is not being taken. She sees a podiatrist.Eye exam is current.  Hyperlipidemia  This is a chronic problem. The problem is controlled. She has no history of chronic renal disease. Pertinent negatives include no chest pain, focal weakness, myalgias or shortness of breath. Current antihyperlipidemic treatment includes statins.  Gastroesophageal Reflux  She reports no abdominal pain, no chest pain, no choking, no coughing, no dysphagia, no early satiety, no heartburn, no nausea, no sore throat or no wheezing. This is a chronic problem. The current episode started more than 1 year ago. The symptoms are aggravated by certain foods. Pertinent negatives include no fatigue, melena or weight loss. She has tried a PPI for the symptoms.    No problem-specific Assessment & Plan notes found for this encounter.   Past Medical History:  Diagnosis Date  . Diabetes mellitus without complication (HCC)   . GERD (gastroesophageal reflux disease)   . Hyperlipidemia   . Hypertension     Past Surgical History:  Procedure Laterality Date  . ABDOMINAL HYSTERECTOMY      Family History  Problem Relation Age of Onset  . Diabetes Mother   . Cancer Mother     breast and stomach  . Heart disease Father   . Kidney disease Father     dialysis  . Cancer Father     colon    Social History   Social History  . Marital status: Single    Spouse name: N/A  . Number of children: N/A  . Years of education: N/A   Occupational History  . Not on file.   Social History Main Topics  . Smoking status: Never Smoker  . Smokeless  tobacco: Never Used  . Alcohol use 0.0 oz/week  . Drug use: No  . Sexual activity: Not on file   Other Topics Concern  . Not on file   Social History Narrative  . No narrative on file    Allergies  Allergen Reactions  . Other Anaphylaxis  . Ace Inhibitors Swelling  . Shellfish Allergy Swelling     Outpatient Medications Prior to Visit  Medication Sig Dispense Refill  . EPINEPHrine (EPIPEN 2-PAK) 0.3 mg/0.3 mL IJ SOAJ injection Inject 0.3 mLs (0.3 mg total) into the muscle once. Take for severe allergic reaction, then come immediately to the Emergency Department or call 911. 1 Device 0  . fexofenadine (ALLEGRA) 180 MG tablet Take 180 mg by mouth daily.    . valACYclovir (VALTREX) 500 MG tablet Take 500 mg by mouth 2 (two) times daily. As needed outbreak    . VITAMIN D, CHOLECALCIFEROL, PO Take 1 capsule by mouth daily.    Marland Kitchen amLODipine (NORVASC) 5 MG tablet TAKE 1 TABLET(5 MG) BY MOUTH DAILY 90 tablet 1  . atorvastatin (LIPITOR) 20 MG tablet Take 1 tablet (20 mg total) by mouth daily. 90 tablet 1  . empagliflozin (JARDIANCE) 10 MG TABS tablet Take 10 mg by mouth daily. 30 tablet 1  . metFORMIN (GLUCOPHAGE) 500 MG tablet Take 1 tablet (500 mg total) by mouth 2 (two) times daily with a meal. 180 tablet 1  . pantoprazole (PROTONIX) 40 MG tablet Take 1 tablet (40 mg total) by mouth 2 (two) times daily. 180 tablet 1  . pioglitazone (ACTOS) 30 MG tablet Take 1 tablet (30 mg total) by mouth daily. 90 tablet 1  . triamterene-hydrochlorothiazide (MAXZIDE-25) 37.5-25 MG tablet Take 0.5 tablets by mouth daily. 45 tablet 1  . amLODipine (NORVASC) 5 MG tablet TAKE 1 TABLET(5 MG) BY MOUTH DAILY 90 tablet 0   Facility-Administered Medications Prior to Visit  Medication Dose Route Frequency Provider Last Rate Last Dose  . lidocaine (PF) (XYLOCAINE) 1 % injection 5 mL  5 mL Infiltration Once Ewing Schlein, MD      . sodium chloride 0.9 % injection 10 mL  10 mL Intracatheter Once Ewing Schlein, MD      . triamcinolone acetonide (KENALOG-40) injection (RADIOLOGY ONLY) 40 mg  40 mg Epidural Once Ewing Schlein, MD        Review of Systems  Constitutional: Negative for chills, fatigue, fever, malaise/fatigue and weight loss.  HENT: Negative for ear discharge, ear pain and sore throat.   Eyes: Negative  for blurred vision.  Respiratory: Negative for cough, sputum production, choking, shortness of breath and wheezing.   Cardiovascular: Negative for chest pain, palpitations, orthopnea, leg swelling and PND.  Gastrointestinal: Negative for abdominal pain, blood in stool, constipation, diarrhea, dysphagia, heartburn, melena and nausea.  Genitourinary: Negative for dysuria, frequency, hematuria and urgency.  Musculoskeletal: Negative for back pain, joint pain, myalgias and neck pain.  Skin: Negative for rash.  Neurological: Negative for dizziness, tingling, sensory change, focal weakness, weakness and headaches.  Endo/Heme/Allergies: Negative for environmental allergies, polydipsia and polyphagia. Does not bruise/bleed easily.  Psychiatric/Behavioral: Negative for depression and suicidal ideas. The patient is not nervous/anxious and does not have insomnia.      Objective  Vitals:   07/03/16 1408  BP: 112/70  Pulse: 72  Weight: 187 lb (84.8 kg)  Height: 5\' 3"  (1.6 m)    Physical Exam  Constitutional: She is well-developed, well-nourished, and in no distress. No distress.  HENT:  Head: Normocephalic  and atraumatic.  Right Ear: External ear normal.  Left Ear: External ear normal.  Nose: Nose normal.  Mouth/Throat: Oropharynx is clear and moist.  Eyes: Conjunctivae and EOM are normal. Pupils are equal, round, and reactive to light. Right eye exhibits no discharge. Left eye exhibits no discharge.  Neck: Normal range of motion. Neck supple. No JVD present. No thyromegaly present.  Cardiovascular: Normal rate, regular rhythm, normal heart sounds and intact distal pulses.  Exam reveals no gallop and no friction rub.   No murmur heard. Pulmonary/Chest: Effort normal and breath sounds normal. She has no wheezes. She has no rales.  Abdominal: Soft. Bowel sounds are normal. She exhibits no mass. There is no tenderness. There is no guarding.  Musculoskeletal: Normal range of motion. She exhibits  no edema.  Lymphadenopathy:    She has no cervical adenopathy.  Neurological: She is alert. She has normal reflexes.  Skin: Skin is warm and dry. She is not diaphoretic.  Psychiatric: Mood and affect normal.  Nursing note and vitals reviewed.     Assessment & Plan  Problem List Items Addressed This Visit      Cardiovascular and Mediastinum   Essential hypertension   Relevant Medications   amLODipine (NORVASC) 5 MG tablet   triamterene-hydrochlorothiazide (MAXZIDE-25) 37.5-25 MG tablet   atorvastatin (LIPITOR) 20 MG tablet     Digestive   Esophageal reflux   Relevant Medications   pantoprazole (PROTONIX) 40 MG tablet     Endocrine   Type 2 diabetes mellitus without complication (HCC)   Relevant Medications   atorvastatin (LIPITOR) 20 MG tablet   pioglitazone (ACTOS) 30 MG tablet   metFORMIN (GLUCOPHAGE) 500 MG tablet   empagliflozin (JARDIANCE) 10 MG TABS tablet     Other   Hyperlipidemia   Relevant Medications   amLODipine (NORVASC) 5 MG tablet   triamterene-hydrochlorothiazide (MAXZIDE-25) 37.5-25 MG tablet   atorvastatin (LIPITOR) 20 MG tablet    Other Visit Diagnoses    Leg swelling       amlodipine vs venous insufficiency   Relevant Medications   triamterene-hydrochlorothiazide (MAXZIDE-25) 37.5-25 MG tablet      Meds ordered this encounter  Medications  . amLODipine (NORVASC) 5 MG tablet    Sig: TAKE 1 TABLET(5 MG) BY MOUTH DAILY    Dispense:  90 tablet    Refill:  1    **Patient requests 90 days supply**  . triamterene-hydrochlorothiazide (MAXZIDE-25) 37.5-25 MG tablet    Sig: Take 0.5 tablets by mouth daily.    Dispense:  45 tablet    Refill:  1  . atorvastatin (LIPITOR) 20 MG tablet    Sig: Take 1 tablet (20 mg total) by mouth daily.    Dispense:  90 tablet    Refill:  1  . pioglitazone (ACTOS) 30 MG tablet    Sig: Take 1 tablet (30 mg total) by mouth daily.    Dispense:  90 tablet    Refill:  1  . metFORMIN (GLUCOPHAGE) 500 MG tablet     Sig: Take 1 tablet (500 mg total) by mouth 2 (two) times daily with a meal.    Dispense:  180 tablet    Refill:  1  . empagliflozin (JARDIANCE) 10 MG TABS tablet    Sig: Take 10 mg by mouth daily.    Dispense:  90 tablet    Refill:  1  . pantoprazole (PROTONIX) 40 MG tablet    Sig: Take 1 tablet (40 mg total) by mouth 2 (two) times  daily.    Dispense:  90 tablet    Refill:  1      Dr. Elizabeth Sauer [facility]  07/03/16

## 2016-07-26 ENCOUNTER — Other Ambulatory Visit: Payer: Self-pay | Admitting: Family Medicine

## 2016-07-26 DIAGNOSIS — E119 Type 2 diabetes mellitus without complications: Secondary | ICD-10-CM

## 2016-08-14 ENCOUNTER — Other Ambulatory Visit: Payer: Self-pay | Admitting: Family Medicine

## 2016-08-14 DIAGNOSIS — K219 Gastro-esophageal reflux disease without esophagitis: Secondary | ICD-10-CM

## 2016-09-20 ENCOUNTER — Other Ambulatory Visit: Payer: Self-pay

## 2016-10-22 ENCOUNTER — Other Ambulatory Visit: Payer: Self-pay

## 2016-11-08 ENCOUNTER — Emergency Department: Payer: BLUE CROSS/BLUE SHIELD

## 2016-11-08 ENCOUNTER — Emergency Department
Admission: EM | Admit: 2016-11-08 | Discharge: 2016-11-08 | Disposition: A | Discharge status: Home / Self Care | Payer: BLUE CROSS/BLUE SHIELD | Attending: Emergency Medicine | Admitting: Emergency Medicine

## 2016-11-08 DIAGNOSIS — Z79899 Other long term (current) drug therapy: Secondary | ICD-10-CM | POA: Diagnosis not present

## 2016-11-08 DIAGNOSIS — K209 Esophagitis, unspecified without bleeding: Secondary | ICD-10-CM

## 2016-11-08 DIAGNOSIS — E119 Type 2 diabetes mellitus without complications: Secondary | ICD-10-CM | POA: Diagnosis not present

## 2016-11-08 DIAGNOSIS — Z7984 Long term (current) use of oral hypoglycemic drugs: Secondary | ICD-10-CM | POA: Insufficient documentation

## 2016-11-08 DIAGNOSIS — R079 Chest pain, unspecified: Secondary | ICD-10-CM

## 2016-11-08 DIAGNOSIS — R0789 Other chest pain: Secondary | ICD-10-CM | POA: Diagnosis present

## 2016-11-08 DIAGNOSIS — I10 Essential (primary) hypertension: Secondary | ICD-10-CM | POA: Diagnosis not present

## 2016-11-08 DIAGNOSIS — R4702 Dysphasia: Secondary | ICD-10-CM | POA: Diagnosis not present

## 2016-11-08 LAB — CBC
HEMATOCRIT: 39.7 % (ref 35.0–47.0)
Hemoglobin: 13.5 g/dL (ref 12.0–16.0)
MCH: 32.7 pg (ref 26.0–34.0)
MCHC: 33.9 g/dL (ref 32.0–36.0)
MCV: 96.5 fL (ref 80.0–100.0)
PLATELETS: 237 10*3/uL (ref 150–440)
RBC: 4.11 MIL/uL (ref 3.80–5.20)
RDW: 18.5 % — AB (ref 11.5–14.5)
WBC: 9.4 10*3/uL (ref 3.6–11.0)

## 2016-11-08 LAB — BASIC METABOLIC PANEL
ANION GAP: 12 (ref 5–15)
BUN: 9 mg/dL (ref 6–20)
CO2: 28 mmol/L (ref 22–32)
Calcium: 9.9 mg/dL (ref 8.9–10.3)
Chloride: 99 mmol/L — ABNORMAL LOW (ref 101–111)
Creatinine, Ser: 0.88 mg/dL (ref 0.44–1.00)
GFR calc Af Amer: >60 mL/min (ref >60)
GFR calc non Af Amer: >60 mL/min (ref >60)
GLUCOSE: 110 mg/dL — AB (ref 65–99)
POTASSIUM: 3.6 mmol/L (ref 3.5–5.1)
Sodium: 139 mmol/L (ref 135–145)

## 2016-11-08 LAB — FIBRIN DERIVATIVES D-DIMER (ARMC ONLY): FIBRIN DERIVATIVES D-DIMER (ARMC): 318.2 (ref 0.00–499.00)

## 2016-11-08 LAB — TROPONIN I: Troponin I: <0.03 ng/mL (ref <0.03)

## 2016-11-08 MED ORDER — SUCRALFATE 1 GM/10ML PO SUSP: 1.0000 g | Freq: Three times a day (TID) | ORAL | 0 refills | Status: DC | PRN

## 2016-11-08 MED ORDER — FLUCONAZOLE 100 MG PO TABS: 100.0000 mg | ORAL_TABLET | Freq: Every day | ORAL | 0 refills | Status: AC

## 2016-11-08 MED ORDER — GI COCKTAIL ~~LOC~~
30.0000 mL | Freq: Once | ORAL | Status: AC
  Administered 2016-11-08: 30 mL via ORAL
  Filled 2016-11-08: qty 30

## 2016-11-08 MED ORDER — SUCRALFATE 1 G PO TABS
1.0000 g | ORAL_TABLET | Freq: Once | ORAL | Status: AC
  Administered 2016-11-08: 1 g via ORAL
  Filled 2016-11-08: qty 1

## 2016-11-08 MED ORDER — FLUCONAZOLE 100 MG PO TABS
200.0000 mg | ORAL_TABLET | Freq: Once | ORAL | Status: AC
  Administered 2016-11-08: 200 mg via ORAL
  Filled 2016-11-08: qty 2

## 2016-11-08 NOTE — ED Notes (Signed)

## 2016-11-08 NOTE — ED Provider Notes (Signed)
[facility] Emergency Department Provider Note  ____________________________________________   First MD Initiated Contact with Patient 11/08/16 1741     (approximate)  I have reviewed the triage vital signs and the nursing notes.   HISTORY  Chief Complaint Chest Pain   HPI Lisa Galvan is a 56 y.o. female with a history of gastroesophageal reflux as well as diabetes and hypertension who is presenting to the emergency department today with an aching chest pain as well as throat pain over the past 12-18 hours. Says the pain is 7 out of 10 right now it has been constant but worse when she lays flat. She is denying any nausea, vomiting or diaphoresis. Denies any shortness of breath. Says that the pain worsens when she eats or drinks something and that the pain feels like it is going rightward to her right breast. Denies any radiation to the back. Says that she tried Tums as well as pantoprazole earlier today without any relief.Patient does not report any worsening of the pain with deep breathing.   Past Medical History:  Diagnosis Date  . Diabetes mellitus without complication (HCC)   . GERD (gastroesophageal reflux disease)   . Hyperlipidemia   . Hypertension     Patient Active Problem List   Diagnosis Date Noted  . DDD (degenerative disc disease), lumbar 12/16/2014  . Facet syndrome, lumbar (HCC) 12/16/2014  . Sacroiliac joint dysfunction 12/16/2014  . Neuropathy due to secondary diabetes (HCC) 12/16/2014  . Chest pain 12/15/2014  . Combined fat and carbohydrate induced hyperlipemia 12/15/2014  . Vitamin D deficiency 12/13/2014  . Obesity (BMI 30.0-34.9) 12/10/2014  . Chronic sciatica 12/10/2014  . Essential hypertension 12/10/2014  . Type 2 diabetes mellitus without complication (HCC) 12/10/2014  . Hyperlipidemia 12/10/2014  . Esophageal reflux 12/10/2014    Past Surgical History:  Procedure Laterality Date  . ABDOMINAL HYSTERECTOMY       Prior to Admission medications   Medication Sig Start Date End Date Taking? Authorizing Provider  amLODipine (NORVASC) 5 MG tablet TAKE 1 TABLET(5 MG) BY MOUTH DAILY 07/03/16  Yes Duanne Limerick, MD  atorvastatin (LIPITOR) 20 MG tablet Take 1 tablet (20 mg total) by mouth daily. 07/03/16  Yes Duanne Limerick, MD  empagliflozin (JARDIANCE) 10 MG TABS tablet Take 10 mg by mouth daily. 07/03/16  Yes Duanne Limerick, MD  EPINEPHrine (EPIPEN 2-PAK) 0.3 mg/0.3 mL IJ SOAJ injection Inject 0.3 mLs (0.3 mg total) into the muscle once. Take for severe allergic reaction, then come immediately to the Emergency Department or call 911. 03/24/15  Yes Loleta Rose, MD  fexofenadine (ALLEGRA) 180 MG tablet Take 180 mg by mouth daily.   Yes [provider]  JARDIANCE 10 MG TABS tablet TAKE 1 TABLET BY MOUTH DAILY 07/26/16  Yes Duanne Limerick, MD  metFORMIN (GLUCOPHAGE) 500 MG tablet Take 1 tablet (500 mg total) by mouth 2 (two) times daily with a meal. 07/03/16  Yes Duanne Limerick, MD  pantoprazole (PROTONIX) 40 MG tablet TAKE 1 TABLET(40 MG) BY MOUTH TWICE DAILY 08/14/16  Yes Duanne Limerick, MD  pioglitazone (ACTOS) 30 MG tablet Take 1 tablet (30 mg total) by mouth daily. 07/03/16  Yes Duanne Limerick, MD  triamterene-hydrochlorothiazide (MAXZIDE-25) 37.5-25 MG tablet Take 0.5 tablets by mouth daily. 07/03/16  Yes Duanne Limerick, MD  valACYclovir (VALTREX) 500 MG tablet Take 500 mg by mouth 2 (two) times daily. As needed outbreak   Yes [provider]  VITAMIN D,  CHOLECALCIFEROL, PO Take 1 capsule by mouth daily.   Yes [provider]    Allergies Other; Ace inhibitors; and Shellfish allergy  Family History  Problem Relation Age of Onset  . Diabetes Mother   . Cancer Mother        breast and stomach  . Heart disease Father   . Kidney disease Father        dialysis  . Cancer Father        colon    Social History Social History  Substance Use Topics  . Smoking status:  Never Smoker  . Smokeless tobacco: Never Used  . Alcohol use 0.0 oz/week    Review of Systems  Constitutional: No fever/chills Eyes: No visual changes. ENT: No sore throat. Cardiovascular: as above Respiratory: Denies shortness of breath. Gastrointestinal: No abdominal pain.  No nausea, no vomiting.  No diarrhea.  No constipation. Genitourinary: Negative for dysuria. Musculoskeletal: Negative for back pain. Skin: Negative for rash. Neurological: Negative for headaches, focal weakness or numbness.   ____________________________________________   PHYSICAL EXAM:  VITAL SIGNS: ED Triage Vitals  Enc Vitals Group     BP 11/08/16 1614 112/78     Pulse Rate 11/08/16 1614 (!) 102     Resp 11/08/16 1614 18     Temp 11/08/16 1614 98.4 F (36.9 C)     Temp Source 11/08/16 1614 Oral     SpO2 11/08/16 1614 98 %     Weight 11/08/16 1615 184 lb (83.5 kg)     Height 11/08/16 1615 5\' 3"  (1.6 m)     Head Circumference --      Peak Flow --      Pain Score 11/08/16 1614 7     Pain Loc --      Pain Edu? --      Excl. in GC? --     Constitutional: Alert and oriented. Well appearing and in no acute distress. Eyes: Conjunctivae are normal.  Head: Atraumatic. Nose: No congestion/rhinnorhea. Mouth/Throat: Mucous membranes are moist. No tonsillar or uvular erythema nor swelling However, the posterior pharynx is erythematous with a small amount of a white, cottage cheeselike appearing substance on the uvula. Neck: No stridor.   Cardiovascular: Normal rate, regular rhythm. Grossly normal heart sounds.   Respiratory: Normal respiratory effort.  No retractions. Lungs CTAB. Gastrointestinal: Soft and nontender. No distention.  Musculoskeletal: No lower extremity tenderness nor edema.  No joint effusions. Neurologic:  Normal speech and language. No gross focal neurologic deficits are appreciated. Skin:  Skin is warm, dry and intact. No rash noted. Psychiatric: Mood and affect are normal. Speech  and behavior are normal.  ____________________________________________   LABS (all labs ordered are listed, but only abnormal results are displayed)  Labs Reviewed  BASIC METABOLIC PANEL - Abnormal; Notable for the following:       Result Value   Chloride 99 (*)    Glucose, Bld 110 (*)    All other components within normal limits  CBC - Abnormal; Notable for the following:    RDW 18.5 (*)    All other components within normal limits  TROPONIN I  FIBRIN DERIVATIVES D-DIMER ([facility] ONLY)  TROPONIN I   ____________________________________________  EKG  ED ECG REPORT I, Arelia LongestSchaevitz,  Quaniyah Bugh M, the attending physician, personally viewed and interpreted this ECG.   Date: 11/08/2016  EKG Time: 1612  Rate: 103  Rhythm: sinus tachycardia  Axis: Normal  Intervals:none  ST&T Change: No ST segment elevation or depression. No  abnormal T-wave inversion.  ____________________________________________  RADIOLOGY  No edema or consolidation. ____________________________________________   PROCEDURES  Procedure(s) performed:   Procedures  Critical Care performed:   ____________________________________________   INITIAL IMPRESSION / ASSESSMENT AND PLAN / ED COURSE  Pertinent labs & imaging results that were available during my care of the patient were reviewed by me and considered in my medical decision making (see chart for details).  ----------------------------------------- 7:02 PM on 11/08/2016 -----------------------------------------  Patient says that the GI cocktail and sucralfate and brought her chest pain down to 4 out of 10 but her throat pain is persistent.    ----------------------------------------- 8:33 PM on 11/08/2016 -----------------------------------------  Patient says that she is still feeling a "soreness" to her chest that the majority of the pain now is now her throat when she swallows. She is able to control her secretions and has a normal voice.  There is no stridor. She denies any recent fever.  Patient does have evidence of thrush and will be treated with fluconazole addition to sucralfate. She is understandable and will comply. She will be given follow-up with gastroenterology. Very reassuring cardiac workup and also a negative d-dimer. Relative to be ACS or blood clot.  ____________________________________________   FINAL CLINICAL IMPRESSION(S) / ED DIAGNOSES  Chest pain. Esophagitis. Dysphagia.    NEW MEDICATIONS STARTED DURING THIS VISIT:  New Prescriptions   No medications on file     Note:  This document was prepared using Dragon voice recognition software and may include unintentional dictation errors.     Myrna Blazer, MD 11/08/16 2036

## 2016-11-08 NOTE — ED Triage Notes (Signed)
Pt reports to ED w/ c/o CP that started last night. Pt sts pain in center of chest w/ radiation to R breast. Pt sts pain worse w/ lying down and movement. Pt A/OX4, resp even and unlabored. NAD.

## 2016-11-16 ENCOUNTER — Inpatient Hospital Stay: Payer: BLUE CROSS/BLUE SHIELD | Admitting: Family Medicine

## 2016-11-21 LAB — HM DIABETES EYE EXAM

## 2016-12-06 ENCOUNTER — Other Ambulatory Visit: Payer: Self-pay

## 2016-12-07 ENCOUNTER — Ambulatory Visit (INDEPENDENT_AMBULATORY_CARE_PROVIDER_SITE_OTHER): Payer: BLUE CROSS/BLUE SHIELD | Admitting: Gastroenterology

## 2016-12-07 ENCOUNTER — Encounter: Payer: Self-pay | Admitting: Gastroenterology

## 2016-12-07 VITALS — BP 115/80 | HR 88 | Temp 98.1°F | Ht 63.0 in | Wt 181.4 lb

## 2016-12-07 DIAGNOSIS — K219 Gastro-esophageal reflux disease without esophagitis: Secondary | ICD-10-CM

## 2016-12-07 NOTE — Progress Notes (Signed)
Arlyss Repress, MD [address]  [address]  [place], [place] [address]  Main: [phone]  Fax: [phone]    Gastroenterology Consultation  Referring Provider:     Duanne Limerick, MD Primary Care Physician:  Duanne Limerick, MD Primary Gastroenterologist:  Dr. Arlyss Repress Reason for Consultation:     GERD        HPI:   Lisa Galvan is a 56 y.o. y/o female referred for consultation & management  by Dr. Duanne Limerick, MD.  She has several years of heartburn, that has worsened over one year, describes it as pain in the chest radiating to right side as well as throat pain, worse laying flat. The pain is worse after eating or drinking something, denies any radiation to the back. She has previously tried Tums, Pepcid, she has been on Protonix for about 4 years was taking at 9 AM and 9 PM not associated with timing of food. She does not find protonix to be effective anymore. She was taking Nexium prior to protonix but changed when it became over-the-counter medication. She reports having sore taste in mouth, belching, feels like something is constantly moving down her throat. She had recent exacerbation and went to the emergency room last month. She has gained a few pounds in last few months. She does not drink sodas, no alcohol no smoking. She can tolerate tea, wine.  She goes to gym 3 times a week but she does consume high carb diet, likes to eat a lot of cheese.  GI Procedures: Colonoscopy 6 years ago in [place] EGD 8 years ago, reportedly normal Denies family history of esophageal cancer, stomach cancer, colon cancer  Past Medical History:  Diagnosis Date  . Diabetes mellitus without complication (HCC)   . GERD (gastroesophageal reflux disease)   . Hyperlipidemia   . Hypertension     Past Surgical History:  Procedure Laterality Date  . ABDOMINAL HYSTERECTOMY      Prior to Admission medications   Medication Sig Start Date End Date Taking? Authorizing Provider    amLODipine (NORVASC) 5 MG tablet TAKE 1 TABLET(5 MG) BY MOUTH DAILY 07/03/16  Yes Duanne Limerick, MD  atorvastatin (LIPITOR) 20 MG tablet Take 1 tablet (20 mg total) by mouth daily. 07/03/16  Yes Duanne Limerick, MD  empagliflozin (JARDIANCE) 10 MG TABS tablet Take 10 mg by mouth daily. 07/03/16  Yes Duanne Limerick, MD  EPINEPHrine (EPIPEN 2-PAK) 0.3 mg/0.3 mL IJ SOAJ injection Inject 0.3 mLs (0.3 mg total) into the muscle once. Take for severe allergic reaction, then come immediately to the Emergency Department or call 911. 03/24/15  Yes Loleta Rose, MD  fexofenadine (ALLEGRA) 180 MG tablet Take 180 mg by mouth daily.   Yes [provider]  metFORMIN (GLUCOPHAGE) 500 MG tablet Take 1 tablet (500 mg total) by mouth 2 (two) times daily with a meal. 07/03/16  Yes Duanne Limerick, MD  Olopatadine HCl 0.2 % SOLN Apply to eye. 03/18/15  Yes [provider]  pantoprazole (PROTONIX) 40 MG tablet TAKE 1 TABLET(40 MG) BY MOUTH TWICE DAILY 08/14/16  Yes Duanne Limerick, MD  pioglitazone (ACTOS) 30 MG tablet Take 1 tablet (30 mg total) by mouth daily. 07/03/16  Yes Duanne Limerick, MD  VITAMIN D, CHOLECALCIFEROL, PO Take 1 capsule by mouth daily.   Yes [provider]  fluconazole (DIFLUCAN) 100 MG tablet Take 1 tablet (100 mg total) by mouth daily. Patient not taking: Reported  on 12/07/2016 11/08/16 12/08/16  Myrna Blazer, MD  JARDIANCE 10 MG TABS tablet TAKE 1 TABLET BY MOUTH DAILY Patient not taking: Reported on 12/07/2016 07/26/16   Duanne Limerick, MD  sucralfate (CARAFATE) 1 GM/10ML suspension Take 10 mLs (1 g total) by mouth 3 (three) times daily with meals as needed (pain with swallowing). Patient not taking: Reported on 12/07/2016 11/08/16 11/08/17  Myrna Blazer, MD  triamterene-hydrochlorothiazide (MAXZIDE-25) 37.5-25 MG tablet Take 0.5 tablets by mouth daily. Patient not taking: Reported on 12/07/2016 07/03/16   Duanne Limerick, MD  valACYclovir (VALTREX) 500  MG tablet Take 500 mg by mouth 2 (two) times daily. As needed outbreak    [provider]    Family History  Problem Relation Age of Onset  . Diabetes Mother   . Cancer Mother        breast and stomach  . Heart disease Father   . Kidney disease Father        dialysis  . Cancer Father        colon     Social History  Substance Use Topics  . Smoking status: Never Smoker  . Smokeless tobacco: Never Used  . Alcohol use 0.0 oz/week    Allergies as of 12/07/2016 - Review Complete 12/07/2016  Allergen Reaction Noted  . Other Anaphylaxis 07/04/2015  . Shellfish-derived products Anaphylaxis 12/15/2014  . Ace inhibitors Swelling 06/05/2016  . Shellfish allergy Swelling 12/16/2014    Review of Systems:    All systems reviewed and negative except where noted in HPI.   Physical Exam:  BP 115/80   Pulse 88   Temp 98.1 F (36.7 C) (Oral)   Ht 5\' 3"  (1.6 m)   Wt 82.3 kg (181 lb 6.4 oz)   BMI 32.13 kg/m  No LMP recorded. Patient has had a hysterectomy.  General:   Alert,  Well-developed, well-nourished, pleasant and cooperative in NAD Head:  Normocephalic and atraumatic. Eyes:  Sclera clear, no icterus.   Conjunctiva pink. Ears:  Normal auditory acuity. Nose:  No deformity, discharge, or lesions. Mouth:  No deformity or lesions,oropharynx pink & moist. Neck:  Supple; no masses or thyromegaly. Lungs:  Respirations even and unlabored.  Clear throughout to auscultation.   No wheezes, crackles, or rhonchi. No acute distress. Heart:  Regular rate and rhythm; no murmurs, clicks, rubs, or gallops. Abdomen:  Normal bowel sounds.  No bruits.  Soft, non-tender and non-distended without masses, hepatosplenomegaly or hernias noted.  No guarding or rebound tenderness.    Msk:  Symmetrical without gross deformities. Good, equal movement & strength bilaterally. Pulses:  Normal pulses noted. Extremities:  No clubbing or edema.  No cyanosis. Neurologic:  Alert and oriented x3;   grossly normal neurologically. Skin:  Intact without significant lesions or rashes. No jaundice. Lymph Nodes:  No significant cervical adenopathy. Psych:  Alert and cooperative. Normal mood and affect.  Imaging Studies: Dg Chest 2 View  Result Date: 11/08/2016 CLINICAL DATA:  Chest pain and shortness of breath EXAM: CHEST  2 VIEW COMPARISON:  None. FINDINGS: There is slight elevation of the left hemidiaphragm. There is no edema or consolidation. Heart size and pulmonary vascularity are normal. No adenopathy. No evident bone lesions. No pneumothorax. IMPRESSION: No edema or consolidation. Electronically Signed   By: Bretta Bang III M.D.   On: 11/08/2016 16:51    Assessment and Plan:   Lisa Galvan is a 56 y.o. y/o female with chronic GERD, on long-term PPI therapy. Her GERD  symptoms are not fully controlled.  She has not been taking Protonix as she is supposed to  1. Take protonix before breakfast and dinner 2. Try to loose 5-10lbs in 3months 3. Follow antireflux measures and healthy lifestyle 4. If symptoms persist in 3months, will repeat EGD   Follow up in 3 months   Arlyss Repress, MD

## 2016-12-07 NOTE — Patient Instructions (Addendum)
1. Take protonix before breakfast and dinner 2. Try to loose 5lb in 69months 3. Follow antireflux measures 4. If symptoms persist in 26months, will repeat EGD  Please call our office to speak with my nurse Iva Lento at [phone] during business hours from 8am to 4pm if you have any questions/concerns. During after hours, you will be redirected to on call GI physician. For any emergency please call 911 or go the nearest emergency room.    Arlyss Repress, MD [address]  [address]  [place], [place] [address]  Main: [phone]  Fax: [phone]

## 2017-01-03 ENCOUNTER — Encounter: Payer: Self-pay | Admitting: Family Medicine

## 2017-01-03 ENCOUNTER — Ambulatory Visit (INDEPENDENT_AMBULATORY_CARE_PROVIDER_SITE_OTHER): Payer: BLUE CROSS/BLUE SHIELD | Admitting: Family Medicine

## 2017-01-03 VITALS — BP 130/70 | HR 84 | Ht 63.0 in | Wt 178.0 lb

## 2017-01-03 DIAGNOSIS — E785 Hyperlipidemia, unspecified: Secondary | ICD-10-CM

## 2017-01-03 DIAGNOSIS — Z23 Encounter for immunization: Secondary | ICD-10-CM

## 2017-01-03 DIAGNOSIS — E119 Type 2 diabetes mellitus without complications: Secondary | ICD-10-CM

## 2017-01-03 DIAGNOSIS — I10 Essential (primary) hypertension: Secondary | ICD-10-CM

## 2017-01-03 DIAGNOSIS — R1319 Other dysphagia: Secondary | ICD-10-CM

## 2017-01-03 DIAGNOSIS — E1340 Other specified diabetes mellitus with diabetic neuropathy, unspecified: Secondary | ICD-10-CM

## 2017-01-03 DIAGNOSIS — R21 Rash and other nonspecific skin eruption: Secondary | ICD-10-CM

## 2017-01-03 DIAGNOSIS — K219 Gastro-esophageal reflux disease without esophagitis: Secondary | ICD-10-CM

## 2017-01-03 DIAGNOSIS — R1310 Dysphagia, unspecified: Secondary | ICD-10-CM

## 2017-01-03 DIAGNOSIS — M7989 Other specified soft tissue disorders: Secondary | ICD-10-CM

## 2017-01-03 MED ORDER — AMLODIPINE BESYLATE 5 MG PO TABS: ORAL_TABLET | ORAL | 1 refills | Status: DC

## 2017-01-03 MED ORDER — TRIAMTERENE-HCTZ 37.5-25 MG PO TABS: 0.5000 | ORAL_TABLET | Freq: Every day | ORAL | 1 refills | Status: DC

## 2017-01-03 MED ORDER — EMPAGLIFLOZIN 10 MG PO TABS: 10.0000 mg | ORAL_TABLET | Freq: Every day | ORAL | 1 refills | Status: DC

## 2017-01-03 MED ORDER — METFORMIN HCL 500 MG PO TABS: 500.0000 mg | ORAL_TABLET | Freq: Two times a day (BID) | ORAL | 1 refills | Status: DC

## 2017-01-03 MED ORDER — TRIAMCINOLONE ACETONIDE 0.1 % EX CREA: 1.0000 "application " | TOPICAL_CREAM | Freq: Two times a day (BID) | CUTANEOUS | 0 refills | Status: DC

## 2017-01-03 MED ORDER — PANTOPRAZOLE SODIUM 40 MG PO TBEC: DELAYED_RELEASE_TABLET | ORAL | 1 refills | Status: DC

## 2017-01-03 MED ORDER — ATORVASTATIN CALCIUM 20 MG PO TABS: 20.0000 mg | ORAL_TABLET | Freq: Every day | ORAL | 1 refills | Status: DC

## 2017-01-03 MED ORDER — PIOGLITAZONE HCL 30 MG PO TABS: 30.0000 mg | ORAL_TABLET | Freq: Every day | ORAL | 1 refills | Status: DC

## 2017-01-03 NOTE — Progress Notes (Signed)
Name: Lisa Galvan   MRN: [id number]    DOB: [birth year]   Date:01/03/2017       Progress Note  Subjective  Chief Complaint  Chief Complaint  Patient presents with  . Hyperlipidemia  . Hypertension  . Gastroesophageal Reflux  . Depression  . Rash    on arms- looks like white- looking streaks- doesn't itch    Hyperlipidemia  This is a chronic problem. The current episode started more than 1 year ago. The problem is controlled. Recent lipid tests were reviewed and are normal. Exacerbating diseases include obesity. She has no history of chronic renal disease, diabetes, hypothyroidism, liver disease or nephrotic syndrome. Pertinent negatives include no chest pain, focal sensory loss, focal weakness, leg pain, myalgias or shortness of breath. Current antihyperlipidemic treatment includes statins. The current treatment provides no improvement of lipids. There are no compliance problems.  Risk factors for coronary artery disease include diabetes mellitus, hypertension, post-menopausal and dyslipidemia.  Hypertension  This is a chronic problem. The current episode started more than 1 year ago. The problem is controlled. Pertinent negatives include no anxiety, blurred vision, chest pain, headaches, malaise/fatigue, neck pain, orthopnea, palpitations, peripheral edema, PND, shortness of breath or sweats. Risk factors for coronary artery disease include diabetes mellitus and obesity. Past treatments include diuretics and calcium channel blockers. The current treatment provides moderate improvement. There are no compliance problems.  There is no history of angina, kidney disease, CAD/MI, CVA, heart failure, left ventricular hypertrophy, PVD or retinopathy. There is no history of chronic renal disease.  Gastroesophageal Reflux  She reports no abdominal pain, no belching, no chest pain, no choking, no coughing, no dysphagia, no globus sensation, no heartburn, no hoarse voice, no nausea, no sore throat, no  stridor, no tooth decay or no wheezing. This is a chronic problem. The problem occurs occasionally. The problem has been waxing and waning. The symptoms are aggravated by certain foods. Pertinent negatives include no fatigue, melena or weight loss. She has tried a PPI for the symptoms. The treatment provided moderate relief.  Depression         This is a chronic problem.  The current episode started more than 1 year ago.   The onset quality is gradual.   The problem has been waxing and waning since onset.  Associated symptoms include no fatigue, does not have insomnia, no myalgias, no headaches and no suicidal ideas.  Past treatments include nothing.  Previous treatment provided mild relief.   Pertinent negatives include no hypothyroidism and no anxiety. Rash  This is a new problem. The current episode started 1 to 4 weeks ago. The problem has been waxing and waning since onset. The affected locations include the left arm and right arm. Pertinent negatives include no cough, diarrhea, fatigue, fever, joint pain, shortness of breath or sore throat.  Diabetes  She presents for her follow-up diabetic visit. She has type 2 diabetes mellitus. Her disease course has been stable. Pertinent negatives for hypoglycemia include no dizziness, headaches, nervousness/anxiousness or sweats. Pertinent negatives for diabetes include no blurred vision, no chest pain, no fatigue, no polydipsia and no weight loss. Symptoms are stable. Pertinent negatives for diabetic complications include no CVA, peripheral neuropathy, PVD or retinopathy. Risk factors for coronary artery disease include hypertension and dyslipidemia. Current diabetic treatment includes oral agent (triple therapy). She is compliant with treatment all of the time. She is following a generally healthy diet. She participates in exercise intermittently. Her home blood glucose trend is fluctuating minimally. Her  breakfast blood glucose is taken between 8-9 am. Her  breakfast blood glucose range is generally 90-110 mg/dl. An ACE inhibitor/angiotensin II receptor blocker is not being taken. Eye exam is current.    No problem-specific Assessment & Plan notes found for this encounter.   Past Medical History:  Diagnosis Date  . Diabetes mellitus without complication (HCC)   . GERD (gastroesophageal reflux disease)   . Hyperlipidemia   . Hypertension     Past Surgical History:  Procedure Laterality Date  . ABDOMINAL HYSTERECTOMY      Family History  Problem Relation Age of Onset  . Diabetes Mother   . Cancer Mother        breast and stomach  . Heart disease Father   . Kidney disease Father        dialysis  . Cancer Father        colon    Social History   Social History  . Marital status: Single    Spouse name: N/A  . Number of children: N/A  . Years of education: N/A   Occupational History  . Not on file.   Social History Main Topics  . Smoking status: Never Smoker  . Smokeless tobacco: Never Used  . Alcohol use 0.0 oz/week  . Drug use: No  . Sexual activity: Not on file   Other Topics Concern  . Not on file   Social History Narrative  . No narrative on file    Allergies  Allergen Reactions  . Other Anaphylaxis  . Shellfish-Derived Products Anaphylaxis  . Ace Inhibitors Swelling  . Shellfish Allergy Swelling    Outpatient Medications Prior to Visit  Medication Sig Dispense Refill  . EPINEPHrine (EPIPEN 2-PAK) 0.3 mg/0.3 mL IJ SOAJ injection Inject 0.3 mLs (0.3 mg total) into the muscle once. Take for severe allergic reaction, then come immediately to the Emergency Department or call 911. 1 Device 0  . fexofenadine (ALLEGRA) 180 MG tablet Take 180 mg by mouth daily.    . valACYclovir (VALTREX) 500 MG tablet Take 500 mg by mouth 2 (two) times daily. As needed outbreak    . VITAMIN D, CHOLECALCIFEROL, PO Take 1 capsule by mouth daily.    Marland Kitchen amLODipine (NORVASC) 5 MG tablet TAKE 1 TABLET(5 MG) BY MOUTH DAILY 90 tablet  1  . atorvastatin (LIPITOR) 20 MG tablet Take 1 tablet (20 mg total) by mouth daily. 90 tablet 1  . empagliflozin (JARDIANCE) 10 MG TABS tablet Take 10 mg by mouth daily. 90 tablet 1  . metFORMIN (GLUCOPHAGE) 500 MG tablet Take 1 tablet (500 mg total) by mouth 2 (two) times daily with a meal. 180 tablet 1  . pantoprazole (PROTONIX) 40 MG tablet TAKE 1 TABLET(40 MG) BY MOUTH TWICE DAILY 180 tablet 0  . pioglitazone (ACTOS) 30 MG tablet Take 1 tablet (30 mg total) by mouth daily. 90 tablet 1  . triamterene-hydrochlorothiazide (MAXZIDE-25) 37.5-25 MG tablet Take 0.5 tablets by mouth daily. 45 tablet 1  . JARDIANCE 10 MG TABS tablet TAKE 1 TABLET BY MOUTH DAILY (Patient not taking: Reported on 12/07/2016) 30 tablet 0  . Olopatadine HCl 0.2 % SOLN Apply to eye.    . sucralfate (CARAFATE) 1 GM/10ML suspension Take 10 mLs (1 g total) by mouth 3 (three) times daily with meals as needed (pain with swallowing). (Patient not taking: Reported on 12/07/2016) 300 mL 0   Facility-Administered Medications Prior to Visit  Medication Dose Route Frequency Provider Last Rate Last Dose  . lidocaine (  PF) (XYLOCAINE) 1 % injection 5 mL  5 mL Infiltration Once Ewing Schlein, MD      . sodium chloride 0.9 % injection 10 mL  10 mL Intracatheter Once Ewing Schlein, MD      . triamcinolone acetonide (KENALOG-40) injection (RADIOLOGY ONLY) 40 mg  40 mg Epidural Once Ewing Schlein, MD        Review of Systems  Constitutional: Negative for chills, fatigue, fever, malaise/fatigue and weight loss.  HENT: Negative for ear discharge, ear pain, hoarse voice and sore throat.   Eyes: Negative for blurred vision.  Respiratory: Negative for cough, sputum production, choking, shortness of breath and wheezing.   Cardiovascular: Negative for chest pain, palpitations, orthopnea, leg swelling and PND.  Gastrointestinal: Negative for abdominal pain, blood in stool, constipation, diarrhea, dysphagia, heartburn, melena and nausea.   Genitourinary: Negative for dysuria, frequency, hematuria and urgency.  Musculoskeletal: Negative for back pain, joint pain, myalgias and neck pain.  Skin: Positive for rash.  Neurological: Negative for dizziness, tingling, sensory change, focal weakness and headaches.  Endo/Heme/Allergies: Negative for environmental allergies and polydipsia. Does not bruise/bleed easily.  Psychiatric/Behavioral: Positive for depression. Negative for suicidal ideas. The patient is not nervous/anxious and does not have insomnia.      Objective  Vitals:   01/03/17 1015  BP: 130/70  Pulse: 84  Weight: 178 lb (80.7 kg)  Height:  (1.6 m)    Physical Exam  Constitutional: She is well-developed, well-nourished, and in no distress. No distress.  HENT:  Head: Normocephalic and atraumatic.  Right Ear: Tympanic membrane, external ear and ear canal normal.  Left Ear: External ear normal. A middle ear effusion is present.  Nose: Nose normal.  Mouth/Throat: Oropharynx is clear and moist.  Eyes: Pupils are equal, round, and reactive to light. Conjunctivae and EOM are normal. Right eye exhibits no discharge. Left eye exhibits no discharge.  Neck: Normal range of motion. Neck supple. No JVD present. No thyromegaly present.  Cardiovascular: Normal rate, regular rhythm, normal heart sounds and intact distal pulses.  Exam reveals no gallop and no friction rub.   No murmur heard. Pulmonary/Chest: Effort normal and breath sounds normal. She has no wheezes. She has no rales.  Abdominal: Soft. Bowel sounds are normal. She exhibits no mass. There is no tenderness. There is no guarding.  Musculoskeletal: Normal range of motion. She exhibits no edema.  Lymphadenopathy:    She has no cervical adenopathy.  Neurological: She is alert.  Skin: Skin is warm and dry. She is not diaphoretic.  Psychiatric: Mood and affect normal.  Nursing note and vitals reviewed.     Assessment & Plan  Problem List Items Addressed  This Visit      Cardiovascular and Mediastinum   Essential hypertension   Relevant Medications   amLODipine (NORVASC) 5 MG tablet   triamterene-hydrochlorothiazide (MAXZIDE-25) 37.5-25 MG tablet   atorvastatin (LIPITOR) 20 MG tablet   Other Relevant Orders   Renal Function Panel     Endocrine   Type 2 diabetes mellitus without complication (HCC) - Primary   Relevant Medications   atorvastatin (LIPITOR) 20 MG tablet   empagliflozin (JARDIANCE) 10 MG TABS tablet   pioglitazone (ACTOS) 30 MG tablet   metFORMIN (GLUCOPHAGE) 500 MG tablet   Other Relevant Orders   Hemoglobin A1c   Renal Function Panel   Neuropathy due to secondary diabetes (HCC)   Relevant Medications   atorvastatin (LIPITOR) 20 MG tablet   empagliflozin (JARDIANCE) 10 MG TABS tablet  pioglitazone (ACTOS) 30 MG tablet   metFORMIN (GLUCOPHAGE) 500 MG tablet     Other   Hyperlipidemia   Relevant Medications   amLODipine (NORVASC) 5 MG tablet   triamterene-hydrochlorothiazide (MAXZIDE-25) 37.5-25 MG tablet   atorvastatin (LIPITOR) 20 MG tablet   Other Relevant Orders   Lipid Profile    Other Visit Diagnoses    Leg swelling       amlodipine vs venous insufficiency   Relevant Medications   triamterene-hydrochlorothiazide (MAXZIDE-25) 37.5-25 MG tablet   Gastroesophageal reflux disease, esophagitis presence not specified       Relevant Medications   pantoprazole (PROTONIX) 40 MG tablet   Esophageal dysphagia       Relevant Medications   pantoprazole (PROTONIX) 40 MG tablet   Rash       Relevant Medications   triamcinolone cream (KENALOG) 0.1 %   Influenza vaccine needed       Relevant Orders   Flu Vaccine QUAD 6+ mos PF IM (Fluarix Quad PF) (Completed)      Meds ordered this encounter  Medications  . amLODipine (NORVASC) 5 MG tablet    Sig: TAKE 1 TABLET(5 MG) BY MOUTH DAILY    Dispense:  90 tablet    Refill:  1    **Patient requests 90 days supply**  . triamterene-hydrochlorothiazide  (MAXZIDE-25) 37.5-25 MG tablet    Sig: Take 0.5 tablets by mouth daily.    Dispense:  45 tablet    Refill:  1  . pantoprazole (PROTONIX) 40 MG tablet    Sig: TAKE 1 TABLET(40 MG) BY MOUTH TWICE DAILY    Dispense:  180 tablet    Refill:  1    **Patient requests 90 days supply**  . atorvastatin (LIPITOR) 20 MG tablet    Sig: Take 1 tablet (20 mg total) by mouth daily.    Dispense:  90 tablet    Refill:  1  . empagliflozin (JARDIANCE) 10 MG TABS tablet    Sig: Take 10 mg by mouth daily.    Dispense:  90 tablet    Refill:  1  . pioglitazone (ACTOS) 30 MG tablet    Sig: Take 1 tablet (30 mg total) by mouth daily.    Dispense:  90 tablet    Refill:  1  . metFORMIN (GLUCOPHAGE) 500 MG tablet    Sig: Take 1 tablet (500 mg total) by mouth 2 (two) times daily with a meal.    Dispense:  180 tablet    Refill:  1  . triamcinolone cream (KENALOG) 0.1 %    Sig: Apply 1 application topically 2 (two) times daily.    Dispense:  30 g    Refill:  0      Dr. Hayden Rasmusseneanna Jones [facility]  01/03/17

## 2017-01-04 LAB — RENAL FUNCTION PANEL
ALBUMIN: 4.4 g/dL (ref 3.5–5.5)
BUN/Creatinine Ratio: 5 — ABNORMAL LOW (ref 9–23)
BUN: 4 mg/dL — AB (ref 6–24)
CALCIUM: 9.7 mg/dL (ref 8.7–10.2)
CHLORIDE: 98 mmol/L (ref 96–106)
CO2: 25 mmol/L (ref 20–29)
Creatinine, Ser: 0.87 mg/dL (ref 0.57–1.00)
GFR calc Af Amer: 86 mL/min/{1.73_m2} (ref >59)
GFR calc non Af Amer: 74 mL/min/{1.73_m2} (ref >59)
GLUCOSE: 89 mg/dL (ref 65–99)
POTASSIUM: 4 mmol/L (ref 3.5–5.2)
Phosphorus: 3.9 mg/dL (ref 2.5–4.5)
Sodium: 142 mmol/L (ref 134–144)

## 2017-01-04 LAB — LIPID PANEL
CHOLESTEROL TOTAL: 176 mg/dL (ref 100–199)
Chol/HDL Ratio: 1.4 ratio (ref 0.0–4.4)
HDL: 129 mg/dL (ref >39)
LDL Calculated: 33 mg/dL (ref 0–99)
TRIGLYCERIDES: 70 mg/dL (ref 0–149)
VLDL Cholesterol Cal: 14 mg/dL (ref 5–40)

## 2017-01-04 LAB — HEMOGLOBIN A1C
Est. average glucose Bld gHb Est-mCnc: 134 mg/dL
Hgb A1c MFr Bld: 6.3 % — ABNORMAL HIGH (ref 4.8–5.6)

## 2017-02-25 ENCOUNTER — Other Ambulatory Visit: Payer: Self-pay

## 2017-02-25 DIAGNOSIS — L309 Dermatitis, unspecified: Secondary | ICD-10-CM

## 2017-02-26 ENCOUNTER — Telehealth: Payer: Self-pay | Admitting: Gastroenterology

## 2017-02-26 NOTE — Telephone Encounter (Signed)
Notified patient that I received her phone message and I will forward to Dr. Allegra LaiVanga to advise on.  Thanks [employer]

## 2017-02-26 NOTE — Telephone Encounter (Signed)
Patient called and pressure in chest and acid build up and burns when she belches. Please call patient.

## 2017-03-01 ENCOUNTER — Other Ambulatory Visit: Payer: Self-pay

## 2017-03-01 ENCOUNTER — Telehealth: Payer: Self-pay

## 2017-03-01 DIAGNOSIS — K219 Gastro-esophageal reflux disease without esophagitis: Secondary | ICD-10-CM

## 2017-03-01 MED ORDER — SUCRALFATE 1 GM/10ML PO SUSP: 1.0000 g | Freq: Four times a day (QID) | ORAL | 1 refills | Status: DC

## 2017-03-01 NOTE — Telephone Encounter (Signed)
Hi Dr. Allegra LaiVanga, pt said her symptoms are not getting any better despite protonix being taking twice a day before meals.  I told her I would check with you to see if you would advise going ahead with repeat EGD.  Please advise.  Thanks [employer]

## 2017-03-01 NOTE — Progress Notes (Signed)
Pt has been informed of rx for carafate suspension faxed to pharmacy.  EGD has been scheduled for 03/12/17.  Instructions will be mailed.  Thanks [employer]

## 2017-03-04 ENCOUNTER — Emergency Department
Admission: EM | Admit: 2017-03-04 | Discharge: 2017-03-04 | Disposition: A | Discharge status: Home / Self Care | Payer: BLUE CROSS/BLUE SHIELD | Attending: Emergency Medicine | Admitting: Emergency Medicine

## 2017-03-04 ENCOUNTER — Emergency Department: Payer: BLUE CROSS/BLUE SHIELD

## 2017-03-04 DIAGNOSIS — M79604 Pain in right leg: Secondary | ICD-10-CM | POA: Diagnosis not present

## 2017-03-04 DIAGNOSIS — R200 Anesthesia of skin: Secondary | ICD-10-CM | POA: Insufficient documentation

## 2017-03-04 DIAGNOSIS — I10 Essential (primary) hypertension: Secondary | ICD-10-CM | POA: Insufficient documentation

## 2017-03-04 DIAGNOSIS — R202 Paresthesia of skin: Secondary | ICD-10-CM | POA: Insufficient documentation

## 2017-03-04 DIAGNOSIS — E1140 Type 2 diabetes mellitus with diabetic neuropathy, unspecified: Secondary | ICD-10-CM | POA: Diagnosis not present

## 2017-03-04 DIAGNOSIS — Z7984 Long term (current) use of oral hypoglycemic drugs: Secondary | ICD-10-CM | POA: Diagnosis not present

## 2017-03-04 DIAGNOSIS — M79605 Pain in left leg: Secondary | ICD-10-CM | POA: Insufficient documentation

## 2017-03-04 DIAGNOSIS — M47896 Other spondylosis, lumbar region: Secondary | ICD-10-CM | POA: Diagnosis not present

## 2017-03-04 DIAGNOSIS — Z79899 Other long term (current) drug therapy: Secondary | ICD-10-CM | POA: Insufficient documentation

## 2017-03-04 MED ORDER — HYDROCODONE-ACETAMINOPHEN 5-325 MG PO TABS
1.0000 | ORAL_TABLET | Freq: Once | ORAL | Status: AC
  Administered 2017-03-04: 1 via ORAL
  Filled 2017-03-04: qty 1

## 2017-03-04 MED ORDER — HYDROCODONE-ACETAMINOPHEN 5-325 MG PO TABS: 1.0000 | ORAL_TABLET | Freq: Four times a day (QID) | ORAL | 0 refills | Status: DC | PRN

## 2017-03-04 NOTE — Discharge Instructions (Signed)
Take medication as prescribed.   Your ultrasound was negative for a blood in the lower extremities.  Your x-ray of your lumbar spine indicated arthritic changes in the lumbar spine.  No broken bones or dislocations noted.  Return to emergency department if symptoms significantly worsen  Follow-up with PCP as soon as you can schedule an appointment.

## 2017-03-04 NOTE — ED Triage Notes (Signed)
Pt reporting bilateral leg aching, worsening at night. Pt diabetics. Does not take any medications for pain. No SOB, no CP. Pt able to walk. Pt alert and oriented X4, active, cooperative, pt in NAD. RR even and unlabored, color WNL.

## 2017-03-04 NOTE — ED Notes (Signed)

## 2017-03-04 NOTE — ED Provider Notes (Signed)
[facility] Emergency Department Provider Note   ____________________________________________   I have reviewed the triage vital signs and the nursing notes.   HISTORY  Chief Complaint Leg Pain    HPI Maxcine HamSharon Tarkington is a 56 y.o. female is to emergency department with bilateral lower extremity pain she describes as aching, burning radiating from her thighs to her toes.  Patient reports pain is worse at night and keeps her from sleeping.  Patient reports current symptoms have been worsening over the last several days without fall or traumatic injury.  Patient is a diabetic also has other significant medical history.  Patient is concerned her lateral leg pain may be related to her back or a blood clot.  She denies any recent back injuries.  Patient also denies any recent travel, changes in medication, new medication, recent surgeries, cancer diagnosis or other risk factors for blood clotting. Patient denies fever, chills, headache, vision changes, chest pain, chest tightness, shortness of breath, abdominal pain, nausea and vomiting.  Past Medical History:  Diagnosis Date  . Diabetes mellitus without complication (HCC)   . GERD (gastroesophageal reflux disease)   . Hyperlipidemia   . Hypertension     Patient Active Problem List   Diagnosis Date Noted  . Bilateral leg edema 07/02/2016  . DDD (degenerative disc disease), lumbar 12/16/2014  . Facet syndrome, lumbar 12/16/2014  . Sacroiliac joint dysfunction 12/16/2014  . Neuropathy due to secondary diabetes (HCC) 12/16/2014  . Chest pain 12/15/2014  . Mixed hyperlipidemia 12/15/2014  . Vitamin D deficiency 12/13/2014  . Obesity (BMI 30.0-34.9) 12/10/2014  . Chronic sciatica 12/10/2014  . Essential hypertension 12/10/2014  . Type 2 diabetes mellitus without complication (HCC) 12/10/2014  . Hyperlipidemia 12/10/2014  . Gastroesophageal reflux disease without esophagitis 12/10/2014    Past Surgical History:    Procedure Laterality Date  . ABDOMINAL HYSTERECTOMY      Prior to Admission medications   Medication Sig Start Date End Date Taking? Authorizing Provider  amLODipine (NORVASC) 5 MG tablet TAKE 1 TABLET(5 MG) BY MOUTH DAILY 01/03/17   Duanne LimerickJones, Deanna C, MD  atorvastatin (LIPITOR) 20 MG tablet Take 1 tablet (20 mg total) by mouth daily. 01/03/17   Duanne LimerickJones, Deanna C, MD  empagliflozin (JARDIANCE) 10 MG TABS tablet Take 10 mg by mouth daily. 01/03/17   Duanne LimerickJones, Deanna C, MD  EPINEPHrine (EPIPEN 2-PAK) 0.3 mg/0.3 mL IJ SOAJ injection Inject 0.3 mLs (0.3 mg total) into the muscle once. Take for severe allergic reaction, then come immediately to the Emergency Department or call 911. 03/24/15   Loleta RoseForbach, Cory, MD  fexofenadine (ALLEGRA) 180 MG tablet Take 180 mg by mouth daily.    [provider]  HYDROcodone-acetaminophen (NORCO/VICODIN) 5-325 MG tablet Take 1 tablet every 6 (six) hours as needed by mouth for moderate pain. 03/04/17   Little, Traci M, PA-C  metFORMIN (GLUCOPHAGE) 500 MG tablet Take 1 tablet (500 mg total) by mouth 2 (two) times daily with a meal. 01/03/17   Duanne LimerickJones, Deanna C, MD  pantoprazole (PROTONIX) 40 MG tablet TAKE 1 TABLET(40 MG) BY MOUTH TWICE DAILY 01/03/17   Duanne LimerickJones, Deanna C, MD  pioglitazone (ACTOS) 30 MG tablet Take 1 tablet (30 mg total) by mouth daily. 01/03/17   Duanne LimerickJones, Deanna C, MD  sucralfate (CARAFATE) 1 GM/10ML suspension Take 10 mLs (1 g total) 4 (four) times daily by mouth. 03/01/17   Toney ReilVanga, Rohini Reddy, MD  triamcinolone cream (KENALOG) 0.1 % Apply 1 application topically 2 (two) times daily. 01/03/17  Duanne Limerick, MD  triamterene-hydrochlorothiazide (MAXZIDE-25) 37.5-25 MG tablet Take 0.5 tablets by mouth daily. 01/03/17   Duanne Limerick, MD  valACYclovir (VALTREX) 500 MG tablet Take 500 mg by mouth 2 (two) times daily. As needed outbreak    [provider]  VITAMIN D, CHOLECALCIFEROL, PO Take 1 capsule by mouth daily.    [provider]     Allergies Other; Shellfish-derived products; Ace inhibitors; and Shellfish allergy  Family History  Problem Relation Age of Onset  . Diabetes Mother   . Cancer Mother        breast and stomach  . Heart disease Father   . Kidney disease Father        dialysis  . Cancer Father        colon    Social History Social History   Tobacco Use  . Smoking status: Never Smoker  . Smokeless tobacco: Never Used  Substance Use Topics  . Alcohol use: Yes    Alcohol/week: 0.0 oz  . Drug use: No    Review of Systems Constitutional: Negative for fever/chills Eyes: No visual changes. Cardiovascular: Denies chest pain. Respiratory: Denies cough. Denies shortness of breath. Musculoskeletal: Positive for bilateral lower extremity pain. Skin: Negative for rash. Neurological: Negative for headaches.  Negative focal weakness or numbness. Negative for loss of consciousness. Able to ambulate. ____________________________________________   PHYSICAL EXAM:  VITAL SIGNS: ED Triage Vitals  Enc Vitals Group     BP 03/04/17 1009 124/79     Pulse Rate 03/04/17 1009 86     Resp 03/04/17 1009 18     Temp 03/04/17 1009 98 F (36.7 C)     Temp Source 03/04/17 1009 Oral     SpO2 03/04/17 1009 99 %     Weight 03/04/17 1009 165 lb (74.8 kg)     Height 03/04/17 1009 5\' 3"  (1.6 m)     Head Circumference --      Peak Flow --      Pain Score 03/04/17 1011 8     Pain Loc --      Pain Edu? --      Excl. in GC? --     Constitutional: Alert and oriented. Well appearing and in no acute distress.  Eyes: Conjunctivae are normal. PERRL. EOMI  Head: Normocephalic and atraumatic. Cardiovascular: Normal rate, regular rhythm. Good peripheral circulation. Respiratory: Lumbar spine ROM intact. No tenderness to palpation along spinous processes. No deformities noted. Normal respiratory effort without tachypnea or retractions.  Musculoskeletal: Nontender with normal range of motion in all extremities.   Subjective lower extremity pain and numbness throughout the legs.  No specific palpable tenderness along the ankle knee or hip joints.  Intact sensation along the bilateral lower extremities. Neurologic: Normal speech and language. No gross focal neurologic deficits are appreciated. No gait instability. No sensory loss or abnormal reflexes.  Skin:  Skin is warm, dry and intact. No rash noted. Psychiatric: Mood and affect are normal. Speech and behavior are normal. Patient exhibits appropriate insight and judgement.  ____________________________________________   LABS (all labs ordered are listed, but only abnormal results are displayed)  Labs Reviewed - No data to display ____________________________________________  EKG none ____________________________________________  RADIOLOGY DG lumbar spine complete IMPRESSION: 3 mm anterior slip L4 secondary to facet degenerative changes. Mild L4-5 disc space narrowing. L5-S1 facet degenerative changes greater on the right.  US venous IMG lower bilateral IMPRESSION: No evidence of bilateral lower extremity deep venous thrombosis. ____________________________________________  PROCEDURES  Procedure(s) performed: no   Critical Care performed: no ____________________________________________   INITIAL IMPRESSION / ASSESSMENT AND PLAN / ED COURSE  Pertinent labs & imaging results that were available during my care of the patient were reviewed by me and considered in my medical decision making (see chart for details).  Patient presents to emergency department with bilateral lower extremity pain and numbness worsening over the last several days. History, physical exam findings imaging are reassuring symptoms are not associated with DVT of the lower extremity, fracture, dislocation or neurovascular injury of the lumbar spine.  Symptoms are likely associated with diabetic neuropathy versus sciatica symptoms. Patient noted mild improvement of  symptoms following vicodin during the course of care in the emergency department. Patient will be prescribed short course of Vicodin and recommended transition to ibuprofen as needed for pain.  Patient advised to follow up with PCP as soon as she can schedule an appointment.  Also advised to return to the emergency department if symptoms significantly worsened. Patient informed of clinical course, understand medical decision-making process, and agree with plan.    ____________________________________________   FINAL CLINICAL IMPRESSION(S) / ED DIAGNOSES  Final diagnoses:  Bilateral leg pain  Other osteoarthritis of spine, lumbar region  Numbness and tingling of lower extremity       NEW MEDICATIONS STARTED DURING THIS VISIT:  This SmartLink is deprecated. Use AVSMEDLIST instead to display the medication list for a patient.   Note:  This document was prepared using Dragon voice recognition software and may include unintentional dictation errors.    Clois ComberLittle, Traci M, PA-C 03/04/17 1706    Phineas SemenGoodman, Graydon, MD 03/05/17 [phone]

## 2017-03-04 NOTE — ED Notes (Signed)
Pt returned from scans via wheelchair

## 2017-03-05 ENCOUNTER — Encounter: Payer: Self-pay | Admitting: Family Medicine

## 2017-03-05 ENCOUNTER — Ambulatory Visit: Payer: BLUE CROSS/BLUE SHIELD | Admitting: Family Medicine

## 2017-03-05 VITALS — BP 120/70 | HR 64 | Ht 63.0 in | Wt 165.0 lb

## 2017-03-05 DIAGNOSIS — M5136 Other intervertebral disc degeneration, lumbar region: Secondary | ICD-10-CM

## 2017-03-05 DIAGNOSIS — M47816 Spondylosis without myelopathy or radiculopathy, lumbar region: Secondary | ICD-10-CM

## 2017-03-05 DIAGNOSIS — E1340 Other specified diabetes mellitus with diabetic neuropathy, unspecified: Secondary | ICD-10-CM | POA: Diagnosis not present

## 2017-03-05 MED ORDER — PREDNISONE 10 MG PO TABS: 10.0000 mg | ORAL_TABLET | Freq: Every day | ORAL | 0 refills | Status: DC

## 2017-03-05 MED ORDER — GABAPENTIN 100 MG PO CAPS: 100.0000 mg | ORAL_CAPSULE | Freq: Three times a day (TID) | ORAL | 0 refills | Status: DC

## 2017-03-05 NOTE — Progress Notes (Signed)
Name: Lisa Galvan   MRN: [id number]    DOB: 1960/08/04   Date:03/05/2017       Progress Note  Subjective  Chief Complaint  Chief Complaint  Patient presents with  . Follow-up    bilateral leg pain with burning in feet.     Leg Pain   The incident occurred more than 1 week ago. There was no injury mechanism. The pain is present in the left leg and right leg. The pain is at a severity of 5/10. The pain is moderate. The pain has been intermittent since onset. Associated symptoms include numbness and tingling. Pertinent negatives include no muscle weakness. Exacerbated by: sitting. Treatments tried: hydrocodone. The treatment provided moderate relief.    No problem-specific Assessment & Plan notes found for this encounter.   Past Medical History:  Diagnosis Date  . Diabetes mellitus without complication (HCC)   . GERD (gastroesophageal reflux disease)   . Hyperlipidemia   . Hypertension     Past Surgical History:  Procedure Laterality Date  . ABDOMINAL HYSTERECTOMY      Family History  Problem Relation Age of Onset  . Diabetes Mother   . Cancer Mother        breast and stomach  . Heart disease Father   . Kidney disease Father        dialysis  . Cancer Father        colon    Social History   Socioeconomic History  . Marital status: Widowed    Spouse name: Not on file  . Number of children: Not on file  . Years of education: Not on file  . Highest education level: Not on file  Social Needs  . Financial resource strain: Not on file  . Food insecurity - worry: Not on file  . Food insecurity - inability: Not on file  . Transportation needs - medical: Not on file  . Transportation needs - non-medical: Not on file  Occupational History  . Not on file  Tobacco Use  . Smoking status: Never Smoker  . Smokeless tobacco: Never Used  Substance and Sexual Activity  . Alcohol use: Yes    Alcohol/week: 0.0 oz  . Drug use: No  . Sexual activity: Not on file  Other  Topics Concern  . Not on file  Social History Narrative  . Not on file    Allergies  Allergen Reactions  . Other Anaphylaxis  . Shellfish-Derived Products Anaphylaxis  . Ace Inhibitors Swelling  . Shellfish Allergy Swelling    Outpatient Medications Prior to Visit  Medication Sig Dispense Refill  . amLODipine (NORVASC) 5 MG tablet TAKE 1 TABLET(5 MG) BY MOUTH DAILY 90 tablet 1  . atorvastatin (LIPITOR) 20 MG tablet Take 1 tablet (20 mg total) by mouth daily. 90 tablet 1  . empagliflozin (JARDIANCE) 10 MG TABS tablet Take 10 mg by mouth daily. 90 tablet 1  . EPINEPHrine (EPIPEN 2-PAK) 0.3 mg/0.3 mL IJ SOAJ injection Inject 0.3 mLs (0.3 mg total) into the muscle once. Take for severe allergic reaction, then come immediately to the Emergency Department or call 911. 1 Device 0  . fexofenadine (ALLEGRA) 180 MG tablet Take 180 mg by mouth daily.    Marland Kitchen. HYDROcodone-acetaminophen (NORCO/VICODIN) 5-325 MG tablet Take 1 tablet every 6 (six) hours as needed by mouth for moderate pain. 8 tablet 0  . metFORMIN (GLUCOPHAGE) 500 MG tablet Take 1 tablet (500 mg total) by mouth 2 (two) times daily with a meal. 180 tablet  1  . pantoprazole (PROTONIX) 40 MG tablet TAKE 1 TABLET(40 MG) BY MOUTH TWICE DAILY 180 tablet 1  . pioglitazone (ACTOS) 30 MG tablet Take 1 tablet (30 mg total) by mouth daily. 90 tablet 1  . sucralfate (CARAFATE) 1 GM/10ML suspension Take 10 mLs (1 g total) 4 (four) times daily by mouth. 420 mL 1  . triamcinolone cream (KENALOG) 0.1 % Apply 1 application topically 2 (two) times daily. 30 g 0  . triamterene-hydrochlorothiazide (MAXZIDE-25) 37.5-25 MG tablet Take 0.5 tablets by mouth daily. 45 tablet 1  . valACYclovir (VALTREX) 500 MG tablet Take 500 mg by mouth 2 (two) times daily. As needed outbreak    . VITAMIN D, CHOLECALCIFEROL, PO Take 1 capsule by mouth daily.     Facility-Administered Medications Prior to Visit  Medication Dose Route Frequency Provider Last Rate Last Dose  .  lidocaine (PF) (XYLOCAINE) 1 % injection 5 mL  5 mL Infiltration Once Ewing Schleinrisp, Gregory, MD      . sodium chloride 0.9 % injection 10 mL  10 mL Intracatheter Once Ewing Schleinrisp, Gregory, MD      . triamcinolone acetonide (KENALOG-40) injection (RADIOLOGY ONLY) 40 mg  40 mg Epidural Once Ewing Schleinrisp, Gregory, MD        Review of Systems  Constitutional: Negative for chills, fever, malaise/fatigue and weight loss.  HENT: Negative for ear discharge, ear pain and sore throat.   Eyes: Negative for blurred vision.  Respiratory: Negative for cough, sputum production, shortness of breath and wheezing.   Cardiovascular: Negative for chest pain, palpitations and leg swelling.  Gastrointestinal: Negative for abdominal pain, blood in stool, constipation, diarrhea, heartburn, melena and nausea.  Genitourinary: Negative for dysuria, frequency, hematuria and urgency.  Musculoskeletal: Negative for back pain, joint pain, myalgias and neck pain.  Skin: Negative for rash.  Neurological: Positive for tingling and numbness. Negative for dizziness, sensory change, focal weakness and headaches.  Endo/Heme/Allergies: Negative for environmental allergies and polydipsia. Does not bruise/bleed easily.  Psychiatric/Behavioral: Negative for depression and suicidal ideas. The patient is not nervous/anxious and does not have insomnia.      Objective  Vitals:   03/05/17 0927  BP: 120/70  Pulse: 64  Weight: 165 lb (74.8 kg)  Height: 5\' 3"  (1.6 m)    Physical Exam  Constitutional: She is well-developed, well-nourished, and in no distress. No distress.  HENT:  Head: Normocephalic and atraumatic.  Right Ear: External ear normal.  Left Ear: External ear normal.  Nose: Nose normal.  Mouth/Throat: Oropharynx is clear and moist.  Eyes: Conjunctivae and EOM are normal. Pupils are equal, round, and reactive to light. Right eye exhibits no discharge. Left eye exhibits no discharge.  Neck: Normal range of motion. Neck supple. No JVD  present. No thyromegaly present.  Cardiovascular: Normal rate, regular rhythm, normal heart sounds and intact distal pulses. Exam reveals no gallop and no friction rub.  No murmur heard. Pulmonary/Chest: Effort normal and breath sounds normal. She has no wheezes. She has no rales.  Abdominal: Soft. Bowel sounds are normal. She exhibits no mass. There is no tenderness. There is no guarding.  Musculoskeletal: Normal range of motion. She exhibits no edema.  Lymphadenopathy:    She has no cervical adenopathy.  Neurological: She is alert. She has normal sensation, normal strength and normal reflexes. She has an abnormal Straight Leg Raise Test.  Skin: Skin is warm and dry. She is not diaphoretic.  Psychiatric: Mood and affect normal.  Nursing note and vitals reviewed.  Assessment & Plan  Problem List Items Addressed This Visit      Endocrine   Neuropathy due to secondary diabetes (HCC) - Primary   Relevant Medications   gabapentin (NEURONTIN) 100 MG capsule     Musculoskeletal and Integument   DDD (degenerative disc disease), lumbar   Relevant Medications   predniSONE (DELTASONE) 10 MG tablet   Other Relevant Orders   Ambulatory referral to Orthopedic Surgery   Facet syndrome, lumbar   Relevant Medications   predniSONE (DELTASONE) 10 MG tablet      Meds ordered this encounter  Medications  . gabapentin (NEURONTIN) 100 MG capsule    Sig: Take 1 capsule (100 mg total) 3 (three) times daily by mouth.    Dispense:  90 capsule    Refill:  0  . predniSONE (DELTASONE) 10 MG tablet    Sig: Take 1 tablet (10 mg total) daily with breakfast by mouth.    Dispense:  10 tablet    Refill:  0      Dr. Elizabeth Sauer [facility]  03/05/17

## 2017-03-11 ENCOUNTER — Encounter: Payer: Self-pay | Admitting: *Deleted

## 2017-03-12 ENCOUNTER — Encounter: Payer: Self-pay | Admitting: *Deleted

## 2017-03-12 ENCOUNTER — Ambulatory Visit: Payer: BLUE CROSS/BLUE SHIELD | Admitting: Gastroenterology

## 2017-03-12 ENCOUNTER — Ambulatory Visit: Payer: BLUE CROSS/BLUE SHIELD | Admitting: Anesthesiology

## 2017-03-12 ENCOUNTER — Ambulatory Visit
Admission: RE | Admit: 2017-03-12 | Discharge: 2017-03-12 | Disposition: A | Discharge status: Home / Self Care | Payer: BLUE CROSS/BLUE SHIELD | Source: Ambulatory Visit | Attending: Gastroenterology | Admitting: Gastroenterology

## 2017-03-12 ENCOUNTER — Encounter
Admission: RE | Disposition: A | Discharge status: Home / Self Care | Payer: Self-pay | Source: Ambulatory Visit | Attending: Gastroenterology

## 2017-03-12 DIAGNOSIS — B9681 Helicobacter pylori [H. pylori] as the cause of diseases classified elsewhere: Secondary | ICD-10-CM | POA: Insufficient documentation

## 2017-03-12 DIAGNOSIS — Z888 Allergy status to other drugs, medicaments and biological substances status: Secondary | ICD-10-CM | POA: Insufficient documentation

## 2017-03-12 DIAGNOSIS — K2970 Gastritis, unspecified, without bleeding: Secondary | ICD-10-CM | POA: Diagnosis not present

## 2017-03-12 DIAGNOSIS — Z8249 Family history of ischemic heart disease and other diseases of the circulatory system: Secondary | ICD-10-CM | POA: Insufficient documentation

## 2017-03-12 DIAGNOSIS — Z841 Family history of disorders of kidney and ureter: Secondary | ICD-10-CM | POA: Insufficient documentation

## 2017-03-12 DIAGNOSIS — Z91013 Allergy to seafood: Secondary | ICD-10-CM | POA: Diagnosis not present

## 2017-03-12 DIAGNOSIS — Z90710 Acquired absence of both cervix and uterus: Secondary | ICD-10-CM | POA: Diagnosis not present

## 2017-03-12 DIAGNOSIS — Z800 Family history of malignant neoplasm of digestive organs: Secondary | ICD-10-CM | POA: Insufficient documentation

## 2017-03-12 DIAGNOSIS — I10 Essential (primary) hypertension: Secondary | ICD-10-CM | POA: Diagnosis not present

## 2017-03-12 DIAGNOSIS — K219 Gastro-esophageal reflux disease without esophagitis: Secondary | ICD-10-CM

## 2017-03-12 DIAGNOSIS — E119 Type 2 diabetes mellitus without complications: Secondary | ICD-10-CM | POA: Diagnosis not present

## 2017-03-12 DIAGNOSIS — Z803 Family history of malignant neoplasm of breast: Secondary | ICD-10-CM | POA: Diagnosis not present

## 2017-03-12 DIAGNOSIS — Z79899 Other long term (current) drug therapy: Secondary | ICD-10-CM | POA: Diagnosis not present

## 2017-03-12 DIAGNOSIS — E785 Hyperlipidemia, unspecified: Secondary | ICD-10-CM | POA: Insufficient documentation

## 2017-03-12 DIAGNOSIS — Z833 Family history of diabetes mellitus: Secondary | ICD-10-CM | POA: Insufficient documentation

## 2017-03-12 HISTORY — PX: ESOPHAGOGASTRODUODENOSCOPY (EGD) WITH PROPOFOL: SHX5813

## 2017-03-12 LAB — GLUCOSE, CAPILLARY
GLUCOSE-CAPILLARY: 70 mg/dL (ref 65–99)
GLUCOSE-CAPILLARY: 71 mg/dL (ref 65–99)

## 2017-03-12 SURGERY — ESOPHAGOGASTRODUODENOSCOPY (EGD) WITH PROPOFOL
Anesthesia: General

## 2017-03-12 MED ORDER — LIDOCAINE HCL (CARDIAC) 20 MG/ML IV SOLN
INTRAVENOUS | Status: DC | PRN
  Administered 2017-03-12: 50 mg via INTRAVENOUS

## 2017-03-12 MED ORDER — PROPOFOL 500 MG/50ML IV EMUL
INTRAVENOUS | Status: DC | PRN
  Administered 2017-03-12: 150 ug/kg/min via INTRAVENOUS

## 2017-03-12 MED ORDER — LIDOCAINE HCL (PF) 2 % IJ SOLN
INTRAMUSCULAR | Status: AC
  Filled 2017-03-12: qty 10

## 2017-03-12 MED ORDER — SODIUM CHLORIDE 0.9 % IV SOLN
INTRAVENOUS | Status: DC
  Administered 2017-03-12: 1000 mL via INTRAVENOUS

## 2017-03-12 MED ORDER — PROPOFOL 10 MG/ML IV BOLUS
INTRAVENOUS | Status: DC | PRN
  Administered 2017-03-12: 50 mg via INTRAVENOUS
  Administered 2017-03-12: 10 mg via INTRAVENOUS
  Administered 2017-03-12: 40 mg via INTRAVENOUS

## 2017-03-12 MED ORDER — PROPOFOL 500 MG/50ML IV EMUL
INTRAVENOUS | Status: AC
  Filled 2017-03-12: qty 50

## 2017-03-12 NOTE — Anesthesia Postprocedure Evaluation (Signed)
Anesthesia Post Note  Patient: Lisa Galvan  Procedure(s) Performed: ESOPHAGOGASTRODUODENOSCOPY (EGD) WITH PROPOFOL (N/A )  Patient location during evaluation: Endoscopy Anesthesia Type: General Level of consciousness: awake and alert Pain management: pain level controlled Vital Signs Assessment: post-procedure vital signs reviewed and stable Respiratory status: spontaneous breathing, nonlabored ventilation, respiratory function stable and patient connected to nasal cannula oxygen Cardiovascular status: blood pressure returned to baseline and stable Postop Assessment: no apparent nausea or vomiting Anesthetic complications: no     Last Vitals:  Vitals:   03/12/17 1454 03/12/17 1504  BP: 116/77 116/90  Pulse: 83 90  Resp: 18 (!) 23  Temp:    SpO2: 100% 95%    Last Pain:  Vitals:   03/12/17 1445  TempSrc: Tympanic  PainSc:                  Tramya Schoenfelder S

## 2017-03-12 NOTE — H&P (Signed)
Arlyss Repressohini R , MD [address]  [address]  [place], [place] [address]  Main: [phone]  Fax: [phone] Pager: [phone]  Primary Care Physician:  Duanne LimerickJones, Deanna C, MD Primary Gastroenterologist:  Dr. Arlyss Repressohini R   Pre-Procedure History & Physical: HPI:  Lisa Galvan is a 56 y.o. female is here for an endoscopy.   Past Medical History:  Diagnosis Date  . Diabetes mellitus without complication (HCC)   . GERD (gastroesophageal reflux disease)   . Hyperlipidemia   . Hypertension     Past Surgical History:  Procedure Laterality Date  . ABDOMINAL HYSTERECTOMY      Prior to Admission medications   Medication Sig Start Date End Date Taking? Authorizing Provider  amLODipine (NORVASC) 5 MG tablet TAKE 1 TABLET(5 MG) BY MOUTH DAILY 01/03/17   Duanne LimerickJones, Deanna C, MD  atorvastatin (LIPITOR) 20 MG tablet Take 1 tablet (20 mg total) by mouth daily. 01/03/17   Duanne LimerickJones, Deanna C, MD  empagliflozin (JARDIANCE) 10 MG TABS tablet Take 10 mg by mouth daily. 01/03/17   Duanne LimerickJones, Deanna C, MD  EPINEPHrine (EPIPEN 2-PAK) 0.3 mg/0.3 mL IJ SOAJ injection Inject 0.3 mLs (0.3 mg total) into the muscle once. Take for severe allergic reaction, then come immediately to the Emergency Department or call 911. 03/24/15   Loleta RoseForbach, Cory, MD  fexofenadine (ALLEGRA) 180 MG tablet Take 180 mg by mouth daily.    [provider]  gabapentin (NEURONTIN) 100 MG capsule Take 1 capsule (100 mg total) 3 (three) times daily by mouth. 03/05/17   Duanne LimerickJones, Deanna C, MD  HYDROcodone-acetaminophen (NORCO/VICODIN) 5-325 MG tablet Take 1 tablet every 6 (six) hours as needed by mouth for moderate pain. 03/04/17   Little, Traci M, PA-C  metFORMIN (GLUCOPHAGE) 500 MG tablet Take 1 tablet (500 mg total) by mouth 2 (two) times daily with a meal. 01/03/17   Duanne LimerickJones, Deanna C, MD  pantoprazole (PROTONIX) 40 MG tablet TAKE 1 TABLET(40 MG) BY MOUTH TWICE DAILY 01/03/17   Duanne LimerickJones, Deanna C, MD  pioglitazone (ACTOS) 30 MG tablet Take 1  tablet (30 mg total) by mouth daily. 01/03/17   Duanne LimerickJones, Deanna C, MD  predniSONE (DELTASONE) 10 MG tablet Take 1 tablet (10 mg total) daily with breakfast by mouth. 03/05/17   Duanne LimerickJones, Deanna C, MD  sucralfate (CARAFATE) 1 GM/10ML suspension Take 10 mLs (1 g total) 4 (four) times daily by mouth. 03/01/17   Toney Reil,  Reddy, MD  triamcinolone cream (KENALOG) 0.1 % Apply 1 application topically 2 (two) times daily. 01/03/17   Duanne LimerickJones, Deanna C, MD  triamterene-hydrochlorothiazide (MAXZIDE-25) 37.5-25 MG tablet Take 0.5 tablets by mouth daily. 01/03/17   Duanne LimerickJones, Deanna C, MD  valACYclovir (VALTREX) 500 MG tablet Take 500 mg by mouth 2 (two) times daily. As needed outbreak    [provider]  VITAMIN D, CHOLECALCIFEROL, PO Take 1 capsule by mouth daily.    [provider]    Allergies as of 03/01/2017 - Review Complete 01/03/2017  Allergen Reaction Noted  . Other Anaphylaxis 07/04/2015  . Shellfish-derived products Anaphylaxis 12/15/2014  . Ace inhibitors Swelling 06/05/2016  . Shellfish allergy Swelling 12/16/2014    Family History  Problem Relation Age of Onset  . Diabetes Mother   . Cancer Mother        breast and stomach  . Heart disease Father   . Kidney disease Father        dialysis  . Cancer Father        colon  Social History   Socioeconomic History  . Marital status: Widowed    Spouse name: Not on file  . Number of children: Not on file  . Years of education: Not on file  . Highest education level: Not on file  Social Needs  . Financial resource strain: Not on file  . Food insecurity - worry: Not on file  . Food insecurity - inability: Not on file  . Transportation needs - medical: Not on file  . Transportation needs - non-medical: Not on file  Occupational History  . Not on file  Tobacco Use  . Smoking status: Never Smoker  . Smokeless tobacco: Never Used  Substance and Sexual Activity  . Alcohol use: Yes    Alcohol/week: 0.0 oz  . Drug use: No    . Sexual activity: Not on file  Other Topics Concern  . Not on file  Social History Narrative  . Not on file    Review of Systems: See HPI, otherwise negative ROS  Physical Exam: There were no vitals taken for this visit. General:   Alert,  pleasant and cooperative in NAD Head:  Normocephalic and atraumatic. Neck:  Supple; no masses or thyromegaly. Lungs:  Clear throughout to auscultation.    Heart:  Regular rate and rhythm. Abdomen:  Soft, nontender and nondistended. Normal bowel sounds, without guarding, and without rebound.   Neurologic:  Alert and  oriented x4;  grossly normal neurologically.  Impression/Plan: Lisa Galvan is here for an endoscopy to be performed for chronic GERD  Risks, benefits, limitations, and alternatives regarding  endoscopy have been reviewed with the patient.  Questions have been answered.  All parties agreeable.   Lannette Donathohini , MD  03/12/2017, 1:37 PM

## 2017-03-12 NOTE — Op Note (Signed)
[facility] Patient Name: Lisa Galvan Procedure Date: 03/12/2017 2:22 PM MRN: [id number] Account #: [id number] Date of Birth: 12/02/1960 Admit Type: Outpatient Age: 56 Room: [facility] ENDO ROOM 4 Gender: Female Note Status: Finalized Procedure:            Upper GI endoscopy Indications:          Follow-up of gastro-esophageal reflux disease Providers:            Lin Landsman MD, MD Referring MD:         Juline Patch, MD (Referring MD) Medicines:            Monitored Anesthesia Care Complications:        No immediate complications. Estimated blood loss: None. Procedure:            Pre-Anesthesia Assessment:                       - Prior to the procedure, a History and Physical was                        performed, and patient medications and allergies were                        reviewed. The patient is competent. The risks and                        benefits of the procedure and the sedation options and                        risks were discussed with the patient. All questions                        were answered and informed consent was obtained.                        Patient identification and proposed procedure were                        verified by the physician, the nurse, the                        anesthesiologist, the anesthetist and the technician in                        the pre-procedure area in the procedure room. Mental                        Status Examination: alert and oriented. Airway                        Examination: normal oropharyngeal airway and neck                        mobility. Respiratory Examination: clear to                        auscultation. CV Examination: normal. Prophylactic                        Antibiotics: The patient does not require prophylactic  antibiotics. Prior Anticoagulants: The patient has                        taken no previous anticoagulant or antiplatelet agents.                         ASA Grade Assessment: II - A patient with mild systemic                        disease. After reviewing the risks and benefits, the                        patient was deemed in satisfactory condition to undergo                        the procedure. The anesthesia plan was to use monitored                        anesthesia care (MAC). Immediately prior to                        administration of medications, the patient was                        re-assessed for adequacy to receive sedatives. The                        heart rate, respiratory rate, oxygen saturations, blood                        pressure, adequacy of pulmonary ventilation, and                        response to care were monitored throughout the                        procedure. The physical status of the patient was                        re-assessed after the procedure.                       After obtaining informed consent, the endoscope was                        passed under direct vision. Throughout the procedure,                        the patient's blood pressure, pulse, and oxygen                        saturations were monitored continuously. The Endoscope                        was introduced through the mouth, and advanced to the                        second part of duodenum. The upper GI endoscopy was  accomplished without difficulty. The patient tolerated                        the procedure well. Findings:      The duodenal bulb and second portion of the duodenum were normal.      The entire examined stomach was normal. Biopsies were taken with a cold       forceps for Helicobacter pylori testing.      The cardia and gastric fundus were normal on retroflexion.      The gastroesophageal junction and examined esophagus were normal. Impression:           - Normal duodenal bulb and second portion of the                        duodenum.                       -  Normal stomach. Biopsied.                       - Normal gastroesophageal junction and esophagus. Recommendation:       - Await pathology results.                       - Discharge patient to home.                       - Resume previous diet.                       - Continue present medications.                       - Return to my office in 2 months.                       - Follow an antireflux regimen.                       - Use Protonix (pantoprazole) 40 mg PO BID. Procedure Code(s):    --- Professional ---                       [phone], Esophagogastroduodenoscopy, flexible, transoral;                        with biopsy, single or multiple Diagnosis Code(s):    --- Professional ---                       K21.9, Gastro-esophageal reflux disease without                        esophagitis CPT copyright 2016 American Medical Association. All rights reserved. The codes documented in this report are preliminary and upon coder review may  be revised to meet current compliance requirements. Dr. Ulyess Mort Lin Landsman MD, MD 03/12/2017 2:41:28 PM This report has been signed electronically. Number of Addenda: 0 Note Initiated On: 03/12/2017 2:22 PM Total Procedure Duration: 0 hours 5 minutes 49 seconds       [facility]

## 2017-03-12 NOTE — Anesthesia Preprocedure Evaluation (Signed)
Anesthesia Evaluation  Patient identified by MRN, date of birth, ID band Patient awake    Reviewed: Allergy & Precautions, H&P , NPO status , Patient's Chart, lab work & pertinent test results, reviewed documented beta blocker date and time   Airway Mallampati: II   Neck ROM: full    Dental  (+) Poor Dentition   Pulmonary neg pulmonary ROS,    Pulmonary exam normal        Cardiovascular hypertension, negative cardio ROS Normal cardiovascular exam Rhythm:regular Rate:Normal     Neuro/Psych  Neuromuscular disease negative neurological ROS  negative psych ROS   GI/Hepatic negative GI ROS, Neg liver ROS, GERD  Medicated,  Endo/Other  negative endocrine ROSdiabetes  Renal/GU negative Renal ROS  negative genitourinary   Musculoskeletal   Abdominal   Peds  Hematology negative hematology ROS (+)   Anesthesia Other Findings Past Medical History: No date: Diabetes mellitus without complication (HCC) No date: GERD (gastroesophageal reflux disease) No date: Hyperlipidemia No date: Hypertension Past Surgical History: No date: ABDOMINAL HYSTERECTOMY   Reproductive/Obstetrics negative OB ROS                             Anesthesia Physical Anesthesia Plan  ASA: II  Anesthesia Plan: General   Post-op Pain Management:    Induction:   PONV Risk Score and Plan:   Airway Management Planned:   Additional Equipment:   Intra-op Plan:   Post-operative Plan:   Informed Consent: I have reviewed the patients History and Physical, chart, labs and discussed the procedure including the risks, benefits and alternatives for the proposed anesthesia with the patient or authorized representative who has indicated his/her understanding and acceptance.   Dental Advisory Given  Plan Discussed with: CRNA  Anesthesia Plan Comments:         Anesthesia Quick Evaluation

## 2017-03-12 NOTE — Anesthesia Procedure Notes (Signed)
Date/Time: 03/12/2017 2:25 PM Performed by: Ginger CarneMichelet, Aitana Burry, CRNA Pre-anesthesia Checklist: Patient identified, Emergency Drugs available, Suction available, Patient being monitored and Timeout performed Patient Re-evaluated:Patient Re-evaluated prior to induction Oxygen Delivery Method: Nasal cannula Preoxygenation: Pre-oxygenation with 100% oxygen

## 2017-03-12 NOTE — Transfer of Care (Signed)
Immediate Anesthesia Transfer of Care Note  Patient: Lisa HamSharon Strassner  Procedure(s) Performed: ESOPHAGOGASTRODUODENOSCOPY (EGD) WITH PROPOFOL (N/A )  Patient Location: PACU  Anesthesia Type:General  Level of Consciousness: awake and alert   Airway & Oxygen Therapy: Patient Spontanous Breathing and Patient connected to nasal cannula oxygen  Post-op Assessment: Report given to RN and Post -op Vital signs reviewed and stable  Post vital signs: Reviewed  Last Vitals:  Vitals:   03/12/17 1343  BP: 138/70  Pulse: 75  Resp: 18  Temp: 36.9 C  SpO2: 100%    Last Pain:  Vitals:   03/12/17 1343  TempSrc: Tympanic  PainSc: 0-No pain      Patients Stated Pain Goal: 0 (03/12/17 1343)  Complications: No apparent anesthesia complications

## 2017-03-12 NOTE — Anesthesia Post-op Follow-up Note (Signed)
Anesthesia QCDR form completed.        

## 2017-03-13 ENCOUNTER — Encounter: Payer: Self-pay | Admitting: Gastroenterology

## 2017-03-16 LAB — SURGICAL PATHOLOGY

## 2017-03-21 ENCOUNTER — Telehealth: Payer: Self-pay | Admitting: Gastroenterology

## 2017-03-21 ENCOUNTER — Other Ambulatory Visit: Payer: Self-pay

## 2017-03-21 NOTE — Telephone Encounter (Signed)
Please call patient regarding new medications. She didn't know she was getting new med until the pharmacy called.

## 2017-03-21 NOTE — Telephone Encounter (Signed)
Pt has been informed her pathology shows that she has H. Pylori bacterial infection.  Informed pt this cause nausea, loss of appetite, burping, bloating, abd pain. She has asked to make sure she washes her hands after bathroom, wash all foods thoroughly.  I will mail her some additional patient education as she stated that she has never heard of it before.  Thanks [employer]le

## 2017-03-22 ENCOUNTER — Other Ambulatory Visit: Payer: Self-pay | Admitting: Family Medicine

## 2017-03-22 DIAGNOSIS — E1340 Other specified diabetes mellitus with diabetic neuropathy, unspecified: Secondary | ICD-10-CM

## 2017-04-03 ENCOUNTER — Other Ambulatory Visit: Payer: Self-pay | Admitting: Student

## 2017-04-03 DIAGNOSIS — M79604 Pain in right leg: Secondary | ICD-10-CM

## 2017-04-03 DIAGNOSIS — M79605 Pain in left leg: Secondary | ICD-10-CM

## 2017-04-04 ENCOUNTER — Other Ambulatory Visit: Payer: Self-pay

## 2017-04-04 ENCOUNTER — Telehealth: Payer: Self-pay

## 2017-04-04 DIAGNOSIS — E1340 Other specified diabetes mellitus with diabetic neuropathy, unspecified: Secondary | ICD-10-CM

## 2017-04-04 MED ORDER — GABAPENTIN 100 MG PO CAPS: 200.0000 mg | ORAL_CAPSULE | Freq: Three times a day (TID) | ORAL | 0 refills | Status: DC

## 2017-04-04 NOTE — Telephone Encounter (Signed)
Patient walked in questioning recent denial of Gabapentin 200 mg increase that she claims Neurology said they would only do one month and for her to return to [facility] for the refills on the increased medication. I reviewed notes and saw the note from Neuro and did not see anything advising her to come to PCP for this. Dr. Yetta BarreJones gave her 30 days worth of the increased dose but does want to have her return to Neurology and that was written and reminded to patient who is coming in now tomorrow for breast pain. I have sent this to Delice Bisonara so she can be aware of this.

## 2017-04-05 ENCOUNTER — Encounter: Payer: Self-pay | Admitting: Family Medicine

## 2017-04-05 ENCOUNTER — Ambulatory Visit: Payer: BLUE CROSS/BLUE SHIELD | Admitting: Family Medicine

## 2017-04-05 VITALS — BP 122/80 | HR 72 | Ht 63.0 in | Wt 165.0 lb

## 2017-04-05 DIAGNOSIS — N644 Mastodynia: Secondary | ICD-10-CM

## 2017-04-05 DIAGNOSIS — K429 Umbilical hernia without obstruction or gangrene: Secondary | ICD-10-CM

## 2017-04-05 NOTE — Progress Notes (Signed)
Name: Maxcine HamSharon Zeitz   MRN: [id number]    DOB: [birth year]   Date:04/05/2017       Progress Note  Subjective  Chief Complaint  Chief Complaint  Patient presents with  . Breast Pain    more in the R) breast than L)- feels a lump at 12:00 in R) breast, not feeling anything in the L) breast    Breast pain for 2 weeks.  Pain in lateral aspects of both breasts.  No fever,nipple discharge/bleeding, nor swelling.  No recent trauma.   Pain also has concern for umbillical hernia. Patient has no history of fibrocystic disease,previous lumps, nor cyst formation.Patient on no hormonal therapy. There is no cyclic occurrence of mastalgia.    No problem-specific Assessment & Plan notes found for this encounter.   Past Medical History:  Diagnosis Date  . Diabetes mellitus without complication (HCC)   . GERD (gastroesophageal reflux disease)   . Hyperlipidemia   . Hypertension     Past Surgical History:  Procedure Laterality Date  . ABDOMINAL HYSTERECTOMY    . ESOPHAGOGASTRODUODENOSCOPY (EGD) WITH PROPOFOL N/A 03/12/2017   Procedure: ESOPHAGOGASTRODUODENOSCOPY (EGD) WITH PROPOFOL;  Surgeon: Toney ReilVanga, Rohini Reddy, MD;  [facility] END[facility]COPY;  Service: Gastroenterology;  Laterality: N/A;    Family History  Problem Relation Age of Onset  . Diabetes Mother   . Cancer Mother        breast and stomach  . Heart disease Father   . Kidney disease Father        dialysis  . Cancer Father        colon    Social History   Socioeconomic History  . Marital status: Widowed    Spouse name: Not on file  . Number of children: Not on file  . Years of education: Not on file  . Highest education level: Not on file  Social Needs  . Financial resource strain: Not on file  . Food insecurity - worry: Not on file  . Food insecurity - inability: Not on file  . Transportation needs - medical: Not on file  . Transportation needs - non-medical: Not on file  Occupational History  . Not on file  Tobacco  Use  . Smoking status: Never Smoker  . Smokeless tobacco: Never Used  Substance and Sexual Activity  . Alcohol use: Yes    Alcohol/week: 1.2 oz    Types: 2 Glasses of wine per week  . Drug use: No  . Sexual activity: Not on file  Other Topics Concern  . Not on file  Social History Narrative  . Not on file    Allergies  Allergen Reactions  . Other Anaphylaxis  . Shellfish-Derived Products Anaphylaxis  . Ace Inhibitors Swelling  . Shellfish Allergy Swelling    Outpatient Medications Prior to Visit  Medication Sig Dispense Refill  . amLODipine (NORVASC) 5 MG tablet TAKE 1 TABLET(5 MG) BY MOUTH DAILY 90 tablet 1  . atorvastatin (LIPITOR) 20 MG tablet Take 1 tablet (20 mg total) by mouth daily. 90 tablet 1  . empagliflozin (JARDIANCE) 10 MG TABS tablet Take 10 mg by mouth daily. 90 tablet 1  . EPINEPHrine (EPIPEN 2-PAK) 0.3 mg/0.3 mL IJ SOAJ injection Inject 0.3 mLs (0.3 mg total) into the muscle once. Take for severe allergic reaction, then come immediately to the Emergency Department or call 911. 1 Device 0  . fexofenadine (ALLEGRA) 180 MG tablet Take 180 mg by mouth daily.    Marland Kitchen. gabapentin (NEURONTIN) 100 MG capsule Take  2 capsules (200 mg total) by mouth 3 (three) times daily. MUST GET REFILL BY NEUROLOGY 180 capsule 0  . HYDROcodone-acetaminophen (NORCO/VICODIN) 5-325 MG tablet Take 1 tablet every 6 (six) hours as needed by mouth for moderate pain. 8 tablet 0  . metFORMIN (GLUCOPHAGE) 500 MG tablet Take 1 tablet (500 mg total) by mouth 2 (two) times daily with a meal. 180 tablet 1  . pantoprazole (PROTONIX) 40 MG tablet TAKE 1 TABLET(40 MG) BY MOUTH TWICE DAILY 180 tablet 1  . pioglitazone (ACTOS) 30 MG tablet Take 1 tablet (30 mg total) by mouth daily. 90 tablet 1  . predniSONE (DELTASONE) 10 MG tablet Take 1 tablet (10 mg total) daily with breakfast by mouth. 10 tablet 0  . sucralfate (CARAFATE) 1 GM/10ML suspension Take 10 mLs (1 g total) 4 (four) times daily by mouth. (Patient  not taking: Reported on 03/12/2017) 420 mL 1  . triamcinolone cream (KENALOG) 0.1 % Apply 1 application topically 2 (two) times daily. (Patient not taking: Reported on 03/12/2017) 30 g 0  . triamterene-hydrochlorothiazide (MAXZIDE-25) 37.5-25 MG tablet Take 0.5 tablets by mouth daily. (Patient not taking: Reported on 03/12/2017) 45 tablet 1  . valACYclovir (VALTREX) 500 MG tablet Take 500 mg by mouth 2 (two) times daily. As needed outbreak    . VITAMIN D, CHOLECALCIFEROL, PO Take 1 capsule by mouth daily.     Facility-Administered Medications Prior to Visit  Medication Dose Route Frequency Provider Last Rate Last Dose  . lidocaine (PF) (XYLOCAINE) 1 % injection 5 mL  5 mL Infiltration Once Ewing Schlein, MD      . sodium chloride 0.9 % injection 10 mL  10 mL Intracatheter Once Ewing Schlein, MD      . triamcinolone acetonide (KENALOG-40) injection (RADIOLOGY ONLY) 40 mg  40 mg Epidural Once Ewing Schlein, MD        Review of Systems  Constitutional: Negative for chills, fever, malaise/fatigue and weight loss.  HENT: Negative for ear discharge, ear pain and sore throat.   Eyes: Negative for blurred vision.  Respiratory: Negative for cough, sputum production, shortness of breath and wheezing.   Cardiovascular: Negative for chest pain, palpitations and leg swelling.  Gastrointestinal: Negative for abdominal pain, blood in stool, constipation, diarrhea, heartburn, melena and nausea.  Genitourinary: Negative for dysuria, frequency, hematuria and urgency.  Musculoskeletal: Negative for back pain, joint pain, myalgias and neck pain.  Skin: Negative for rash.  Neurological: Negative for dizziness, tingling, sensory change, focal weakness and headaches.  Endo/Heme/Allergies: Negative for environmental allergies and polydipsia. Does not bruise/bleed easily.  Psychiatric/Behavioral: Negative for depression and suicidal ideas. The patient is not nervous/anxious and does not have insomnia.       Objective  Vitals:   04/05/17 1145  BP: 122/80  Pulse: 72  Weight: 165 lb (74.8 kg)  Height: 5\' 3"  (1.6 m)    Physical Exam  Constitutional: She is well-developed, well-nourished, and in no distress. No distress.  HENT:  Head: Normocephalic and atraumatic.  Right Ear: External ear normal.  Left Ear: External ear normal.  Nose: Nose normal.  Mouth/Throat: Oropharynx is clear and moist.  Eyes: Conjunctivae and EOM are normal. Pupils are equal, round, and reactive to light. Right eye exhibits no discharge. Left eye exhibits no discharge.  Neck: Normal range of motion. Neck supple. No JVD present. No thyromegaly present.  Cardiovascular: Normal rate, regular rhythm, normal heart sounds and intact distal pulses. Exam reveals no gallop and no friction rub.  No murmur heard.  Pulmonary/Chest: Effort normal and breath sounds normal. She has no wheezes. She has no rales. Right breast exhibits tenderness. Right breast exhibits no inverted nipple, no mass, no nipple discharge and no skin change. Left breast exhibits tenderness. Left breast exhibits no inverted nipple, no mass, no nipple discharge and no skin change. Breasts are symmetrical.    Noted areas of tenderness  Abdominal: Soft. Bowel sounds are normal. She exhibits no mass. There is no hepatosplenomegaly. There is no tenderness. There is no rebound, no guarding and no CVA tenderness.  umbillical hernia noted/reducable/ and minimal tenderness  Musculoskeletal: Normal range of motion. She exhibits no edema.  Lymphadenopathy:    She has no cervical adenopathy.    She has no axillary adenopathy.  Neurological: She is alert. She has normal reflexes.  Skin: Skin is warm and dry. She is not diaphoretic.  Psychiatric: Mood and affect normal.  Nursing note and vitals reviewed.     Assessment & Plan  Problem List Items Addressed This Visit    None    Visit Diagnoses    Mastalgia    -  Primary   bilateral/ in area of palpable  breast tissue   Relevant Orders   MM Digital Diagnostic Bilat   Umbilical hernia without obstruction and without gangrene       Relevant Orders   Ambulatory referral to General Surgery      No orders of the defined types were placed in this encounter.     Dr. Hayden Rasmusseneanna Jones [facility]  04/05/17

## 2017-04-09 ENCOUNTER — Encounter: Payer: Self-pay | Admitting: Surgery

## 2017-04-09 ENCOUNTER — Ambulatory Visit: Payer: BLUE CROSS/BLUE SHIELD | Admitting: Surgery

## 2017-04-09 VITALS — BP 108/77 | HR 87 | Temp 98.3°F | Ht 63.0 in | Wt 171.0 lb

## 2017-04-09 DIAGNOSIS — K429 Umbilical hernia without obstruction or gangrene: Secondary | ICD-10-CM | POA: Diagnosis not present

## 2017-04-09 DIAGNOSIS — N644 Mastodynia: Secondary | ICD-10-CM | POA: Diagnosis not present

## 2017-04-09 NOTE — Patient Instructions (Signed)
You have requested for your Umbilical Hernia be repaired. This has been scheduled on Wednesday, 1/9 by Dr. Satira MccallumJason Davis at [facility].  Please see your (blue)pre-care sheet for information.  You will need to arrange to be off work for 1-2 weeks but will have to have a lifting restriction of no more than 15 lbs for 6 weeks following your surgery.   Umbilical Hernia, Adult A hernia is a bulge of tissue that pushes through an opening between muscles. An umbilical hernia happens in the abdomen, near the belly button (umbilicus). The hernia may contain tissues from the small intestine, large intestine, or fatty tissue covering the intestines (omentum). Umbilical hernias in adults tend to get worse over time, and they require surgical treatment. There are several types of umbilical hernias. You may have:  A hernia located just above or below the umbilicus (indirect hernia). This is the most common type of umbilical hernia in adults.  A hernia that forms through an opening formed by the umbilicus (direct hernia).  A hernia that comes and goes (reducible hernia). A reducible hernia may be visible only when you strain, lift something heavy, or cough. This type of hernia can be pushed back into the abdomen (reduced).  A hernia that traps abdominal tissue inside the hernia (incarcerated hernia). This type of hernia cannot be reduced.  A hernia that cuts off blood flow to the tissues inside the hernia (strangulated hernia). The tissues can start to die if this happens. This type of hernia requires emergency treatment.  What are the causes? An umbilical hernia happens when tissue inside the abdomen presses on a weak area of the abdominal muscles. What increases the risk? You may have a greater risk of this condition if you:  Are obese.  Have had several pregnancies.  Have a buildup of fluid inside your abdomen (ascites).  Have had surgery that weakens the abdominal muscles.  What are the  signs or symptoms? The main symptom of this condition is a painless bulge at or near the belly button. A reducible hernia may be visible only when you strain, lift something heavy, or cough. Other symptoms may include:  Dull pain.  A feeling of pressure.  Symptoms of a strangulated hernia may include:  Pain that gets increasingly worse.  Nausea and vomiting.  Pain when pressing on the hernia.  Skin over the hernia becoming red or purple.  Constipation.  Blood in the stool.  How is this diagnosed? This condition may be diagnosed based on:  A physical exam. You may be asked to cough or strain while standing. These actions increase the pressure inside your abdomen and force the hernia through the opening in your muscles. Your health care provider may try to reduce the hernia by pressing on it.  Your symptoms and medical history.  How is this treated? Surgery is the only treatment for an umbilical hernia. Surgery for a strangulated hernia is done as soon as possible. If you have a small hernia that is not incarcerated, you may need to lose weight before having surgery. Follow these instructions at home:  Lose weight, if told by your health care provider.  Do not try to push the hernia back in.  Watch your hernia for any changes in color or size. Tell your health care provider if any changes occur.  You may need to avoid activities that increase pressure on your hernia.  Do not lift anything that is heavier than 10 lb (4.5 kg) until your  health care provider says that this is safe.  Take over-the-counter and prescription medicines only as told by your health care provider.  Keep all follow-up visits as told by your health care provider. This is important. Contact a health care provider if:  Your hernia gets larger.  Your hernia becomes painful. Get help right away if:  You develop sudden, severe pain near the area of your hernia.  You have pain as well as nausea or  vomiting.  You have pain and the skin over your hernia changes color.  You develop a fever. This information is not intended to replace advice given to you by your health care provider. Make sure you discuss any questions you have with your health care provider. Document Released: 09/09/2015 Document Revised: 12/11/2015 Document Reviewed: 09/09/2015 Elsevier Interactive Patient Education  [employer].

## 2017-04-09 NOTE — Progress Notes (Deleted)
Surgical Clinic History and Physical  Referring provider:  Duanne Limerick, MD [address] [place], [place] [address]  HISTORY OF PRESENT ILLNESS (HPI):  56 y.o. female presents for evaluation of ***. Patient reports ***, denies ***.  Recently moved from suburb of [place] to [place]. 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 More recently, her hernia has been protruding more frequently, even while sitting and eating, and has become more uncomfortable. She describes her pain and bulge are worst when constipation or heavy lifting during exercise at her gym. Also over the past 2 weeks, patient complains she's been experiencing B/L upper breast pain and feels a lump on her Right upper breast. She also frequently experiences B/L lower extremity pain for which she's been prescribed gabapentin, which she says provides some relief, and she has a spine MRI scheduled for tomorrow to assess for nerve impingment. Lost 30 lbs over past 2 months due to diet/exercise and food intolerance secondary to GERD, which has been relieved since she was prescribed omeprazole and was treated for H pylori following EGD. Since losing weight, she's able to walk up/down a flight of steps without CP or SOB and on a treadmill likewise without CP or SOB. She checks her blood glucose, takes insulin, and HbA1C has improved.  PAST MEDICAL HISTORY (PMH):  Past Medical History:  Diagnosis Date  . Bilateral leg edema   . Chest pain 12/15/2014  . Chronic sciatica 12/10/2014  . Diabetes mellitus without complication (HCC)   . Essential hypertension 12/10/2014  . Essential hypertension 12/10/2014  . Gastroesophageal reflux disease without esophagitis   . GERD (gastroesophageal reflux disease)   . Hyperlipidemia   . Hypertension   . Mixed hyperlipidemia   . Obesity (BMI 30.0-34.9) 12/10/2014  . Type 2 diabetes mellitus without complication (HCC)  12/10/2014     PAST SURGICAL HISTORY (PSH):  Past Surgical History:  Procedure Laterality Date  . ABDOMINAL HYSTERECTOMY    . ESOPHAGOGASTRODUODENOSCOPY (EGD) WITH PROPOFOL N/A 03/12/2017   Procedure: ESOPHAGOGASTRODUODENOSCOPY (EGD) WITH PROPOFOL;  Surgeon: Toney Reil, MD;  Location: [facility] ENDOSCOPY;  Service: Gastroenterology;  Laterality: N/A;     MEDICATIONS:  Prior to Admission medications   Medication Sig Start Date End Date Taking? Authorizing Provider  amLODipine (NORVASC) 5 MG tablet TAKE 1 TABLET(5 MG) BY MOUTH DAILY 01/03/17  Yes Duanne Limerick, MD  atorvastatin (LIPITOR) 20 MG tablet Take 1 tablet (20 mg total) by mouth daily. 01/03/17  Yes Duanne Limerick, MD  empagliflozin (JARDIANCE) 10 MG TABS tablet Take 10 mg by mouth daily. 01/03/17  Yes Duanne Limerick, MD  EPINEPHrine (EPIPEN 2-PAK) 0.3 mg/0.3 mL IJ SOAJ injection Inject 0.3 mLs (0.3 mg total) into the muscle once. Take for severe allergic reaction, then come immediately to the Emergency Department or call 911. 03/24/15  Yes Loleta Rose, MD  fexofenadine (ALLEGRA) 180 MG tablet Take 180 mg by mouth daily.   Yes [provider]  gabapentin (NEURONTIN) 100 MG capsule Take 2 capsules (200 mg total) by mouth 3 (three) times daily. MUST GET REFILL BY NEUROLOGY 04/04/17  Yes Duanne Limerick, MD  HYDROcodone-acetaminophen (NORCO/VICODIN) 5-325 MG tablet Take 1 tablet every 6 (six) hours as needed by mouth for moderate pain. 03/04/17  Yes Little, Traci M, PA-C  metFORMIN (GLUCOPHAGE) 500 MG tablet Take 1 tablet (500 mg total) by mouth 2 (two) times daily with a meal. 01/03/17  Yes Duanne LimerickJones, Deanna C, MD  pantoprazole (PROTONIX) 40 MG tablet TAKE 1 TABLET(40 MG) BY MOUTH TWICE DAILY 01/03/17  Yes Duanne LimerickJones, Deanna C, MD  pioglitazone (ACTOS) 30 MG tablet Take 1 tablet (30 mg total) by mouth daily. 01/03/17  Yes Duanne LimerickJones, Deanna C, MD  predniSONE (DELTASONE) 10 MG tablet Take 1 tablet (10 mg total) daily with breakfast by mouth.  03/05/17  Yes Duanne LimerickJones, Deanna C, MD  triamterene-hydrochlorothiazide (MAXZIDE-25) 37.5-25 MG tablet Take 0.5 tablets by mouth daily. 01/03/17  Yes Duanne LimerickJones, Deanna C, MD  VITAMIN D, CHOLECALCIFEROL, PO Take 1 capsule by mouth daily.   Yes [provider]  triamcinolone cream (KENALOG) 0.1 % Apply 1 application topically 2 (two) times daily. Patient not taking: Reported on 03/12/2017 01/03/17   Duanne LimerickJones, Deanna C, MD  valACYclovir (VALTREX) 500 MG tablet Take 500 mg by mouth 2 (two) times daily. As needed outbreak    [provider]     ALLERGIES:  Allergies  Allergen Reactions  . Amlodipine Besy-Benazepril Hcl Anaphylaxis  . Other Anaphylaxis  . Shellfish Allergy Swelling and Anaphylaxis  . Shellfish-Derived Products Anaphylaxis  . Ace Inhibitors Swelling     SOCIAL HISTORY:  Social History   Socioeconomic History  . Marital status: Widowed    Spouse name: Not on file  . Number of children: Not on file  . Years of education: Not on file  . Highest education level: Not on file  Social Needs  . Financial resource strain: Not on file  . Food insecurity - worry: Not on file  . Food insecurity - inability: Not on file  . Transportation needs - medical: Not on file  . Transportation needs - non-medical: Not on file  Occupational History  . Not on file  Tobacco Use  . Smoking status: Never Smoker  . Smokeless tobacco: Never Used  Substance and Sexual Activity  . Alcohol use: Yes    Alcohol/week: 1.2 oz    Types: 2 Glasses of wine per week  . Drug use: No  . Sexual activity: Not on file  Other Topics Concern  . Not on file  Social History Narrative  . Not on file    The patient currently resides (home / rehab facility / nursing home): ***Home The patient normally is (ambulatory / bedbound): ***Ambulatory  FAMILY HISTORY:  Family History  Problem Relation Age of Onset  . Diabetes Mother   . Cancer Mother        breast and stomach  . Heart disease Father   .  Kidney disease Father        dialysis  . Cancer Father        colon    Otherwise negative/non-contributory.  REVIEW OF SYSTEMS:  Constitutional: denies any other weight loss, fever, chills, or sweats  Eyes: denies any other vision changes, history of eye injury  ENT: denies sore throat, hearing problems  Respiratory: denies shortness of breath, wheezing  Cardiovascular: denies chest pain, palpitations  Gastrointestinal: ***denies abdominal pain, N/V, ***or diarrhea/and bowel function as per HPI Musculoskeletal: denies any other joint pains or cramps  Skin: Denies any other rashes or skin discolorations ***except as per HPI Neurological: denies any other headache, dizziness, weakness  Psychiatric: Denies any other depression, anxiety   All other review of systems were otherwise negative   VITAL SIGNS:  @VSRANGES @     Height: 5\' 3"  (160 cm) Weight: 171 lb (77.6 kg) BMI (Calculated): 30.3   PHYSICAL EXAM:  Constitutional:  -- ***Normal  body habitus  -- ***Awake, alert, and oriented x3  Eyes:  -- Pupils equally round and reactive to light  -- No scleral icterus  Ear, nose, throat:  -- ***No jugular venous distension -- No nasal drainage, bleeding Pulmonary:  -- ***No crackles  -- ***Equal breath sounds bilaterally -- ***Breathing non-labored at rest Cardiovascular:  -- S1, S2 present  -- No pericardial rubs  Gastrointestinal:  -- ***Abdomen soft, nontender, non-distended, no guarding/rebound  -- ***No abdominal masses appreciated, pulsatile or otherwise  Musculoskeletal and Integumentary:  -- Wounds or skin discoloration: *** -- Extremities: ***B/L UE and LE FROM, hands and feet warm, ***no edema  Neurologic:  -- Motor function: ***Intact and symmetric -- Sensation: ***Intact and symmetric  Pulse/Doppler Exam:  (p=palpable; d=doppler signals; 0=none)     Right   Left   Ax  ***   ***   Brach  ***   ***   Rad  ***   ***   Uln  ***   ***   Fem  ***   ***   Pop   ***   ***   DP  ***   ***   PT  ***   ***   Labs: {Labs :18171}   Imaging studies:  ***   Assessment/Plan:  56 y.o. female with ***, complicated by co-morbidities including ***.   - ***agree with B/L mammogram  - ***all risks, benefits, and alternatives to ***above ***elective procedure(s) were discussed with the patient ***and ***his/her family, all of ***his/her/their questions were answered to ***his/her/their expressed satisfaction, patient expresses ***he/she wishes to proceed, and informed consent was obtained.  - ***will plan for elective outpatient open repair of umbilical hernia with mesh on Wednesday, 1/9 (pm)  - return to clinic 2 weeks following above planned outpatient procedure  - instructed to call if any questions or concerns  All of the above recommendations were discussed with the patient ***and patient's family, and all of patient's ***and family's questions were answered to ***his/her/their expressed satisfaction.  Thank you for the opportunity to participate in this patient's care.  -- Scherrie GerlachJason E. Earlene Plateravis, MD, RPVI [place]: [facility] General [facility] - Partnering for exceptional care. Office: [phone]

## 2017-04-09 NOTE — H&P (View-Only) (Signed)
Surgical Clinic History and Physical  Referring provider:  Duanne LimerickJones, Deanna C, MD [address] [place], [place] [id number]  HISTORY OF PRESENT ILLNESS (HPI):  Pleasant 56 y.o. female who recently moved from [place] presents for evaluation of an umbilical hernia. 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 More recently, her hernia has been protruding more frequently, even while sitting and eating, and has become more uncomfortable. She describes her pain and bulge are worst when occasional constipation or with heavy lifting during gym exercise. Colonoscopy was 7 years ago.  Over the past 2 weeks, patient complains she's also been experiencing B/L upper breast pain and recently palpated a lump on her Right upper breast. Because her mom was diagnosed in her 7660's with breast cancer that later metastasized to her stomach, this has caused patient some anxiety. [place], and her PMD who did not appreciate a palpable Right breast mass has ordered a mammogram that has not yet been scheduled. Patient also frequently experiences B/L lower extremity pain for which she's been prescribed gabapentin, which she says provides some relief, and she has a spine MRI scheduled for tomorrow to assess for nerve impingment.  Lastly, patient also recently lost 30 lbs over past 2 months due to diet/exercise and food intolerance secondary to GERD, which has been relieved since she was prescribed omeprazole and was treated for H pylori following EGD. Since losing weight, she's able to walk up/down a flight of steps without CP or SOB and on a treadmill likewise without CP or SOB. She checks her blood glucose, takes insulin, and HbA1C has improved.  PAST MEDICAL HISTORY (PMH):  Past Medical History:  Diagnosis Date  . Bilateral leg  edema   . Chest pain 12/15/2014  . Chronic sciatica 12/10/2014  . Diabetes mellitus without complication (HCC)   . Essential hypertension 12/10/2014  . Essential hypertension 12/10/2014  . Gastroesophageal reflux disease without esophagitis   . GERD (gastroesophageal reflux disease)   . Hyperlipidemia   . Hypertension   . Mixed hyperlipidemia   . Obesity (BMI 30.0-34.9) 12/10/2014  . Type 2 diabetes mellitus without complication (HCC) 12/10/2014     PAST SURGICAL HISTORY (PSH):  Past Surgical History:  Procedure Laterality Date  . ABDOMINAL HYSTERECTOMY    . ESOPHAGOGASTRODUODENOSCOPY (EGD) WITH PROPOFOL N/A 03/12/2017   Procedure: ESOPHAGOGASTRODUODENOSCOPY (EGD) WITH PROPOFOL;  Surgeon: Toney ReilVanga, Rohini Reddy, MD;  Location[facility];  Service: Gastroenterology;  Laterality: N/A;     MEDICATIONS:  Prior to Admission medications   Medication Sig Start Date End Date Taking? Authorizing Provider  amLODipine (NORVASC) 5 MG tablet TAKE 1 TABLET(5 MG) BY MOUTH DAILY 01/03/17  Yes Duanne LimerickJones, Deanna C, MD  atorvastatin (LIPITOR) 20 MG tablet Take 1 tablet (20 mg total) by mouth daily. 01/03/17  Yes Duanne LimerickJones, Deanna C, MD  empagliflozin (JARDIANCE) 10 MG TABS tablet Take 10 mg by mouth daily. 01/03/17  Yes Duanne LimerickJones, Deanna C, MD  EPINEPHrine (EPIPEN 2-PAK) 0.3 mg/0.3 mL IJ SOAJ injection Inject 0.3 mLs (0.3 mg total) into the muscle once. Take for severe allergic reaction, then come immediately to the Emergency Department or call 911. 03/24/15  Yes Loleta RoseForbach, Cory, MD  fexofenadine (ALLEGRA) 180 MG tablet Take 180 mg by mouth daily.   Yes [provider]  gabapentin (NEURONTIN) 100 MG capsule Take 2 capsules (  200 mg total) by mouth 3 (three) times daily. MUST GET REFILL BY NEUROLOGY 04/04/17  Yes Duanne LimerickJones, Deanna C, MD  HYDROcodone-acetaminophen (NORCO/VICODIN) 5-325 MG tablet Take 1 tablet every 6 (six) hours as needed by mouth for moderate pain. 03/04/17  Yes Little, Traci M, PA-C  metFORMIN  (GLUCOPHAGE) 500 MG tablet Take 1 tablet (500 mg total) by mouth 2 (two) times daily with a meal. 01/03/17  Yes Elizabeth SauerJones, Deanna C, MD  pantoprazole (PROTONIX) 40 MG tablet TAKE 1 TABLET(40 MG) BY MOUTH TWICE DAILY 01/03/17  Yes Duanne LimerickJones, Deanna C, MD  pioglitazone (ACTOS) 30 MG tablet Take 1 tablet (30 mg total) by mouth daily. 01/03/17  Yes Duanne LimerickJones, Deanna C, MD  predniSONE (DELTASONE) 10 MG tablet Take 1 tablet (10 mg total) daily with breakfast by mouth. 03/05/17  Yes Duanne LimerickJones, Deanna C, MD  triamterene-hydrochlorothiazide (MAXZIDE-25) 37.5-25 MG tablet Take 0.5 tablets by mouth daily. 01/03/17  Yes Duanne LimerickJones, Deanna C, MD  VITAMIN D, CHOLECALCIFEROL, PO Take 1 capsule by mouth daily.   Yes [provider]  triamcinolone cream (KENALOG) 0.1 % Apply 1 application topically 2 (two) times daily. Patient not taking: Reported on 03/12/2017 01/03/17   Duanne LimerickJones, Deanna C, MD  valACYclovir (VALTREX) 500 MG tablet Take 500 mg by mouth 2 (two) times daily. As needed outbreak    [provider]     ALLERGIES:  Allergies  Allergen Reactions  . Amlodipine Besy-Benazepril Hcl Anaphylaxis  . Other Anaphylaxis  . Shellfish Allergy Swelling and Anaphylaxis  . Shellfish-Derived Products Anaphylaxis  . Ace Inhibitors Swelling     SOCIAL HISTORY:  Social History   Socioeconomic History  . Marital status: Widowed    Spouse name: Not on file  . Number of children: Not on file  . Years of education: Not on file  . Highest education level: Not on file  Social Needs  . Financial resource strain: Not on file  . Food insecurity - worry: Not on file  . Food insecurity - inability: Not on file  . Transportation needs - medical: Not on file  . Transportation needs - non-medical: Not on file  Occupational History  . Not on file  Tobacco Use  . Smoking status: Never Smoker  . Smokeless tobacco: Never Used  Substance and Sexual Activity  . Alcohol use: Yes    Alcohol/week: 1.2 oz    Types: 2 Glasses of  wine per week  . Drug use: No  . Sexual activity: Not on file  Other Topics Concern  . Not on file  Social History Narrative  . Not on file    The patient currently resides (home / rehab facility / nursing home): Home The patient normally is (ambulatory / bedbound): Ambulatory  FAMILY HISTORY:  Family History  Problem Relation Age of Onset  . Diabetes Mother   . Cancer Mother        breast and stomach  . Heart disease Father   . Kidney disease Father        dialysis  . Cancer Father        colon    Otherwise negative/non-contributory.  REVIEW OF SYSTEMS:  Constitutional: denies any other weight loss, fever, chills, or sweats  Eyes: denies any other vision changes, history of eye injury  ENT: denies sore throat, hearing problems  Respiratory: denies shortness of breath, wheezing  Cardiovascular: denies chest pain, palpitations  Gastrointestinal: abdominal pain, N/V, and bowel function as per HPI Musculoskeletal: denies any other joint pains or cramps  Skin: Denies any other rashes or skin discolorations Neurological: denies any other headache, dizziness, weakness  Psychiatric: Denies any other depression, anxiety   All other review of systems were otherwise negative   VITAL SIGNS:  BP 108/77   Pulse 87   Temp 98.3 F (36.8 C) (Oral)   Ht 5\' 3"  (1.6 m)   Wt 171 lb (77.6 kg)   BMI 30.29 kg/m   PHYSICAL EXAM:  Constitutional:  -- Overweight body habitus  -- Awake, alert, and oriented x3  Eyes:  -- Pupils equally round and reactive to light  -- No scleral icterus  Ear, nose, throat:  -- No jugular venous distension -- No nasal drainage, bleeding Pulmonary:  -- No crackles  -- Equal breath sounds bilaterally -- Breathing non-labored at rest Cardiovascular:  -- S1, S2 present  -- No pericardial rubs  Gastrointestinal:  -- Abdomen soft, nontender, non-distended, no guarding/rebound -- Easily reducible moderately tender small supraumbilical abdominal wall  hernia -- No abdominal masses appreciated, pulsatile or otherwise  Musculoskeletal and Integumentary:  -- Wounds or skin discoloration: None appreciated -- Extremities: B/L UE and LE FROM, hands and feet warm, no edema  Neurologic:  -- Motor function: Intact and symmetric -- Sensation: Intact and symmetric  Labs:  CBC Latest Ref Rng & Units 11/08/2016 12/10/2014  WBC 3.6 - 11.0 K/uL 9.4 5.4  Hemoglobin 12.0 - 16.0 g/dL 16.1 09.6  Hematocrit 04.5 - 47.0 % 39.7 41.2  Platelets 150 - 440 K/uL 237 256   CMP Latest Ref Rng & Units 01/03/2017 11/08/2016 06/05/2016  Glucose 65 - 99 mg/dL 89 409(W) 94  BUN 6 - 24 mg/dL 4(L) 9 10  Creatinine 1.19 - 1.00 mg/dL 1.47 8.29 5.62  Sodium 134 - 144 mmol/L 142 139 142  Potassium 3.5 - 5.2 mmol/L 4.0 3.6 3.8  Chloride 96 - 106 mmol/L 98 99(L) 95(L)  CO2 20 - 29 mmol/L 25 28 26   Calcium 8.7 - 10.2 mg/dL 9.7 9.9 10.3(H)  Total Protein 6.0 - 8.5 g/dL - - -  Total Bilirubin 0.0 - 1.2 mg/dL - - 0.9  Alkaline Phos 39 - 117 IU/L - - 58  AST 0 - 40 IU/L - - 35  ALT 0 - 32 IU/L - - 22    Assessment/Plan:  56 y.o. female with symptomatic (painful) reducible umbilical hernia requiring increasingly frequent self-reduction, complicated by co-morbidities including obesity (BMI >30), DM, HTN, HLD, GERD, and chronic lower extremity diabetic peripheral neuropathy vs sciatic nerve compression.   - agree with B/L mammogram  - all risks, benefits, and alternatives to open repair of umbilical hernia with mesh were discussed with the patient, all of her questions were answered to her expressed satisfaction, patient expresses she wishes to proceed, and informed consent was obtained.  - will plan for elective outpatient open repair of umbilical hernia with mesh on Wednesday, 1/9 (pm)  - return to clinic 2 weeks following above planned elective procedure  - instructed to call if any questions or concerns  All of the above recommendations were discussed with the patient,  and all of patient's questions were answered to her expressed satisfaction.  Thank you for the opportunity to participate in this patient's care.  -- Scherrie Gerlach Earlene Plater, MD, RPVI [place]: [facility] General Surgery - Partnering for exceptional care. Office: [phone]

## 2017-04-09 NOTE — Progress Notes (Signed)
Surgical Clinic History and Physical  Referring provider:  Duanne LimerickJones, Deanna C, MD [address] [place], [place] [address]  HISTORY OF PRESENT ILLNESS (HPI):  Pleasant 56 y.o. female who recently [place] from [place] for evaluation of an umbilical hernia. 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 More recently, her hernia has been protruding more frequently, even while sitting and eating, and has become more uncomfortable. She describes her pain and bulge are worst when occasional constipation or with heavy lifting during gym exercise. Colonoscopy was 7 years ago.  Over the past 2 weeks, patient complains she's also been experiencing B/L upper breast pain and recently palpated a lump on her Right upper breast. Because her mom was diagnosed in her 7660's with breast cancer that later metastasized to her stomach, this has caused patient some anxiety. [place], and her PMD who did not appreciate a palpable Right breast mass has ordered a mammogram that has not yet been scheduled. Patient also frequently experiences B/L lower extremity pain for which she's been prescribed gabapentin, which she says provides some relief, and she has a spine MRI scheduled for tomorrow to assess for nerve impingment.  Lastly, patient also recently lost 30 lbs over past 2 months due to diet/exercise and food intolerance secondary to GERD, which has been relieved since she was prescribed omeprazole and was treated for H pylori following EGD. Since losing weight, she's able to walk up/down a flight of steps without CP or SOB and on a treadmill likewise without CP or SOB. She checks her blood glucose, takes insulin, and HbA1C has improved.  PAST MEDICAL HISTORY (PMH):  Past Medical History:  Diagnosis Date  . Bilateral leg  edema   . Chest pain 12/15/2014  . Chronic sciatica 12/10/2014  . Diabetes mellitus without complication (HCC)   . Essential hypertension 12/10/2014  . Essential hypertension 12/10/2014  . Gastroesophageal reflux disease without esophagitis   . GERD (gastroesophageal reflux disease)   . Hyperlipidemia   . Hypertension   . Mixed hyperlipidemia   . Obesity (BMI 30.0-34.9) 12/10/2014  . Type 2 diabetes mellitus without complication (HCC) 12/10/2014     PAST SURGICAL HISTORY (PSH):  Past Surgical History:  Procedure Laterality Date  . ABDOMINAL HYSTERECTOMY    . ESOPHAGOGASTRODUODENOSCOPY (EGD) WITH PROPOFOL N/A 03/12/2017   Procedure: ESOPHAGOGASTRODUODENOSCOPY (EGD) WITH PROPOFOL;  Surgeon: Toney ReilVanga, Rohini Reddy, MD;  Location: [facility];  Service: Gastroenterology;  Laterality: N/A;     MEDICATIONS:  Prior to Admission medications   Medication Sig Start Date End Date Taking? Authorizing Provider  amLODipine (NORVASC) 5 MG tablet TAKE 1 TABLET(5 MG) BY MOUTH DAILY 01/03/17  Yes Duanne LimerickJones, Deanna C, MD  atorvastatin (LIPITOR) 20 MG tablet Take 1 tablet (20 mg total) by mouth daily. 01/03/17  Yes Duanne LimerickJones, Deanna C, MD  empagliflozin (JARDIANCE) 10 MG TABS tablet Take 10 mg by mouth daily. 01/03/17  Yes Duanne LimerickJones, Deanna C, MD  EPINEPHrine (EPIPEN 2-PAK) 0.3 mg/0.3 mL IJ SOAJ injection Inject 0.3 mLs (0.3 mg total) into the muscle once. Take for severe allergic reaction, then come immediately to the Emergency Department or call 911. 03/24/15  Yes Loleta RoseForbach, Cory, MD  fexofenadine (ALLEGRA) 180 MG tablet Take 180 mg by mouth daily.   Yes [provider]  gabapentin (NEURONTIN) 100 MG capsule Take 2 capsules (  200 mg total) by mouth 3 (three) times daily. MUST GET REFILL BY NEUROLOGY 04/04/17  Yes Duanne LimerickJones, Deanna C, MD  HYDROcodone-acetaminophen (NORCO/VICODIN) 5-325 MG tablet Take 1 tablet every 6 (six) hours as needed by mouth for moderate pain. 03/04/17  Yes Little, Traci M, PA-C  metFORMIN  (GLUCOPHAGE) 500 MG tablet Take 1 tablet (500 mg total) by mouth 2 (two) times daily with a meal. 01/03/17  Yes Elizabeth SauerJones, Deanna C, MD  pantoprazole (PROTONIX) 40 MG tablet TAKE 1 TABLET(40 MG) BY MOUTH TWICE DAILY 01/03/17  Yes Duanne LimerickJones, Deanna C, MD  pioglitazone (ACTOS) 30 MG tablet Take 1 tablet (30 mg total) by mouth daily. 01/03/17  Yes Duanne LimerickJones, Deanna C, MD  predniSONE (DELTASONE) 10 MG tablet Take 1 tablet (10 mg total) daily with breakfast by mouth. 03/05/17  Yes Duanne LimerickJones, Deanna C, MD  triamterene-hydrochlorothiazide (MAXZIDE-25) 37.5-25 MG tablet Take 0.5 tablets by mouth daily. 01/03/17  Yes Duanne LimerickJones, Deanna C, MD  VITAMIN D, CHOLECALCIFEROL, PO Take 1 capsule by mouth daily.   Yes [provider]  triamcinolone cream (KENALOG) 0.1 % Apply 1 application topically 2 (two) times daily. Patient not taking: Reported on 03/12/2017 01/03/17   Duanne LimerickJones, Deanna C, MD  valACYclovir (VALTREX) 500 MG tablet Take 500 mg by mouth 2 (two) times daily. As needed outbreak    [provider]     ALLERGIES:  Allergies  Allergen Reactions  . Amlodipine Besy-Benazepril Hcl Anaphylaxis  . Other Anaphylaxis  . Shellfish Allergy Swelling and Anaphylaxis  . Shellfish-Derived Products Anaphylaxis  . Ace Inhibitors Swelling     SOCIAL HISTORY:  Social History   Socioeconomic History  . Marital status: Widowed    Spouse name: Not on file  . Number of children: Not on file  . Years of education: Not on file  . Highest education level: Not on file  Social Needs  . Financial resource strain: Not on file  . Food insecurity - worry: Not on file  . Food insecurity - inability: Not on file  . Transportation needs - medical: Not on file  . Transportation needs - non-medical: Not on file  Occupational History  . Not on file  Tobacco Use  . Smoking status: Never Smoker  . Smokeless tobacco: Never Used  Substance and Sexual Activity  . Alcohol use: Yes    Alcohol/week: 1.2 oz    Types: 2 Glasses of  wine per week  . Drug use: No  . Sexual activity: Not on file  Other Topics Concern  . Not on file  Social History Narrative  . Not on file    The patient currently resides (home / rehab facility / nursing home): Home The patient normally is (ambulatory / bedbound): Ambulatory  FAMILY HISTORY:  Family History  Problem Relation Age of Onset  . Diabetes Mother   . Cancer Mother        breast and stomach  . Heart disease Father   . Kidney disease Father        dialysis  . Cancer Father        colon    Otherwise negative/non-contributory.  REVIEW OF SYSTEMS:  Constitutional: denies any other weight loss, fever, chills, or sweats  Eyes: denies any other vision changes, history of eye injury  ENT: denies sore throat, hearing problems  Respiratory: denies shortness of breath, wheezing  Cardiovascular: denies chest pain, palpitations  Gastrointestinal: abdominal pain, N/V, and bowel function as per HPI Musculoskeletal: denies any other joint pains or cramps  Skin: Denies any other rashes or skin discolorations Neurological: denies any other headache, dizziness, weakness  Psychiatric: Denies any other depression, anxiety   All other review of systems were otherwise negative   VITAL SIGNS:  BP 108/77   Pulse 87   Temp 98.3 F (36.8 C) (Oral)   Ht 5\' 3"  (1.6 m)   Wt 171 lb (77.6 kg)   BMI 30.29 kg/m   PHYSICAL EXAM:  Constitutional:  -- Overweight body habitus  -- Awake, alert, and oriented x3  Eyes:  -- Pupils equally round and reactive to light  -- No scleral icterus  Ear, nose, throat:  -- No jugular venous distension -- No nasal drainage, bleeding Pulmonary:  -- No crackles  -- Equal breath sounds bilaterally -- Breathing non-labored at rest Cardiovascular:  -- S1, S2 present  -- No pericardial rubs  Gastrointestinal:  -- Abdomen soft, nontender, non-distended, no guarding/rebound -- Easily reducible moderately tender small supraumbilical abdominal wall  hernia -- No abdominal masses appreciated, pulsatile or otherwise  Musculoskeletal and Integumentary:  -- Wounds or skin discoloration: None appreciated -- Extremities: B/L UE and LE FROM, hands and feet warm, no edema  Neurologic:  -- Motor function: Intact and symmetric -- Sensation: Intact and symmetric  Labs:  CBC Latest Ref Rng & Units 11/08/2016 12/10/2014  WBC 3.6 - 11.0 K/uL 9.4 5.4  Hemoglobin 12.0 - 16.0 g/dL 16.1 09.6  Hematocrit 04.5 - 47.0 % 39.7 41.2  Platelets 150 - 440 K/uL 237 256   CMP Latest Ref Rng & Units 01/03/2017 11/08/2016 06/05/2016  Glucose 65 - 99 mg/dL 89 409(W) 94  BUN 6 - 24 mg/dL 4(L) 9 10  Creatinine 1.19 - 1.00 mg/dL 1.47 8.29 5.62  Sodium 134 - 144 mmol/L 142 139 142  Potassium 3.5 - 5.2 mmol/L 4.0 3.6 3.8  Chloride 96 - 106 mmol/L 98 99(L) 95(L)  CO2 20 - 29 mmol/L 25 28 26   Calcium 8.7 - 10.2 mg/dL 9.7 9.9 10.3(H)  Total Protein 6.0 - 8.5 g/dL - - -  Total Bilirubin 0.0 - 1.2 mg/dL - - 0.9  Alkaline Phos 39 - 117 IU/L - - 58  AST 0 - 40 IU/L - - 35  ALT 0 - 32 IU/L - - 22    Assessment/Plan:  56 y.o. female with symptomatic (painful) reducible umbilical hernia requiring increasingly frequent self-reduction, complicated by co-morbidities including obesity (BMI >30), DM, HTN, HLD, GERD, and chronic lower extremity diabetic peripheral neuropathy vs sciatic nerve compression.   - agree with B/L mammogram  - all risks, benefits, and alternatives to open repair of umbilical hernia with mesh were discussed with the patient, all of her questions were answered to her expressed satisfaction, patient expresses she wishes to proceed, and informed consent was obtained.  - will plan for elective outpatient open repair of umbilical hernia with mesh on Wednesday, 1/9 (pm)  - return to clinic 2 weeks following above planned elective procedure  - instructed to call if any questions or concerns  All of the above recommendations were discussed with the patient,  and all of patient's questions were answered to her expressed satisfaction.  Thank you for the opportunity to participate in this patient's care.  -- Scherrie Gerlach Earlene Plater, MD, RPVI [place]: [facility] General Surgery - Partnering for exceptional care. Office: [phone]

## 2017-04-10 ENCOUNTER — Ambulatory Visit
Admission: RE | Admit: 2017-04-10 | Discharge: 2017-04-10 | Disposition: A | Discharge status: Home / Self Care | Payer: BLUE CROSS/BLUE SHIELD | Source: Ambulatory Visit | Attending: Student | Admitting: Student

## 2017-04-10 ENCOUNTER — Other Ambulatory Visit: Payer: Self-pay

## 2017-04-10 DIAGNOSIS — M79605 Pain in left leg: Secondary | ICD-10-CM | POA: Diagnosis not present

## 2017-04-10 DIAGNOSIS — M545 Low back pain: Secondary | ICD-10-CM | POA: Diagnosis not present

## 2017-04-10 DIAGNOSIS — N644 Mastodynia: Secondary | ICD-10-CM

## 2017-04-10 DIAGNOSIS — M79604 Pain in right leg: Secondary | ICD-10-CM | POA: Diagnosis not present

## 2017-04-10 DIAGNOSIS — M5126 Other intervertebral disc displacement, lumbar region: Secondary | ICD-10-CM | POA: Insufficient documentation

## 2017-04-18 ENCOUNTER — Other Ambulatory Visit: Payer: Self-pay | Admitting: *Deleted

## 2017-04-18 ENCOUNTER — Telehealth: Payer: Self-pay | Admitting: Surgery

## 2017-04-18 ENCOUNTER — Ambulatory Visit: Payer: BLUE CROSS/BLUE SHIELD

## 2017-04-18 ENCOUNTER — Inpatient Hospital Stay
Admission: RE | Admit: 2017-04-18 | Discharge: 2017-04-18 | Disposition: A | Discharge status: Home / Self Care | Payer: Self-pay | Source: Ambulatory Visit | Attending: *Deleted | Admitting: *Deleted

## 2017-04-18 DIAGNOSIS — Z9289 Personal history of other medical treatment: Secondary | ICD-10-CM

## 2017-04-18 NOTE — Telephone Encounter (Signed)
Patient called back I gave her, her surgery information.

## 2017-04-18 NOTE — Telephone Encounter (Signed)
I have called patient to go over surgery information below. Patient was not able to talk at this time. She will call back.   Please advise Pt of pre op date/time and sx date. Sx: 05/01/17 with Dr Manfred Shirtsavis--open umbilical hernia repair with mesh.  Pre op: 04/22/17 between 9-1:00pm-phone interview.   Patient made aware to call [phone], between 1-3:00pm the day before surgery, to find out what time to arrive.

## 2017-04-22 ENCOUNTER — Other Ambulatory Visit: Payer: Self-pay

## 2017-04-22 ENCOUNTER — Encounter
Admission: RE | Admit: 2017-04-22 | Discharge: 2017-04-22 | Disposition: A | Discharge status: Home / Self Care | Payer: BLUE CROSS/BLUE SHIELD | Source: Ambulatory Visit | Attending: Surgery | Admitting: Surgery

## 2017-04-22 HISTORY — DX: Unspecified osteoarthritis, unspecified site: M19.90

## 2017-04-22 NOTE — Patient Instructions (Signed)
  [facility]: [facility] 2nd floor medical mall [facility](Medical Mall Entrance-take elevator on left to 2nd floor.  Check in with surgery information desk.) To find out your arrival time please call [phone] between 1PM - 3PM on 04-30-16 TUESDAY  Remember: Instructions that are not followed completely may result in serious medical risk, up to and including death, or upon the discretion of your surgeon and anesthesiologist your surgery may need to be rescheduled.    _x___ 1. Do not eat food after midnight the night before your procedure. NO GUM OR CANDY AFTER MIDNIGHT.  You may drink WATER up to 2 hours before you are scheduled to arrive at the hospital for your procedure.  Do not drink WATER within 2 hours of your scheduled arrival to the hospital.  Type 1 and type 2 diabetics should only drink water.     __x__ 2. No Alcohol for 24 hours before or after surgery.   __x__3. No Smoking for 24 prior to surgery.   ____  4. Bring all medications with you on the day of surgery if instructed.    __x__ 5. Notify your doctor if there is any change in your medical condition     (cold, fever, infections).     Do not wear jewelry, make-up, hairpins, clips or nail polish.  Do not wear lotions, powders, or perfumes. You may wear deodorant.  Do not shave 48 hours prior to surgery. Men may shave face and neck.  Do not bring valuables to the hospital.    [facility] is not responsible for any belongings or valuables.               Contacts, dentures or bridgework may not be worn into surgery.  Leave your suitcase in the car. After surgery it may be brought to your room.  For patients admitted to the hospital, discharge time is determined by your treatment team.   Patients discharged the day of surgery will not be allowed to drive home.  You will need someone to drive you home and stay with you the night of your procedure.    Please read over the  following fact sheets that you were given:   [facility] and or MRSA Information   _x___ TAKE THE FOLLOWING MEDICATIONS THE MORNING OF SURGERY WITH A SMALL SIP OF WATER. These include:  1. LIPITOR (ATORVASTATIN)  2. GABAPENTIN (NEURONTIN)  3. PROTONIX (PANTOPRAZOLE)  4.  5.  6.  ____Fleets enema or Magnesium Citrate as directed.   _x___ Use CHG Soap or sage wipes as directed on instruction sheet   ____ Use inhalers on the day of surgery and bring to hospital day of surgery  _X___ Stop Metformin 2 days prior to surgery-LAST DOSE ON Sunday, January 6TH    ____ Take 1/2 of usual insulin dose the night before surgery and none on the morning surgery.   ____ Follow recommendations from Cardiologist, Pulmonologist or PCP regarding stopping Aspirin, Coumadin, Plavix ,Eliquis, Effient, or Pradaxa, and Pletal.  X____Stop Anti-inflammatories such as Advil, ALEVE, Ibuprofen, Motrin, Naproxen, Naprosyn, Goodies powders or aspirin products NOW-OK to take Tylenol    ____ Stop supplements until after surgery.     ____ Bring C-Pap to the hospital.

## 2017-04-24 ENCOUNTER — Encounter
Admission: RE | Admit: 2017-04-24 | Discharge: 2017-04-24 | Disposition: A | Discharge status: Home / Self Care | Payer: BLUE CROSS/BLUE SHIELD | Source: Ambulatory Visit | Attending: Surgery | Admitting: Surgery

## 2017-04-24 DIAGNOSIS — Z01810 Encounter for preprocedural cardiovascular examination: Secondary | ICD-10-CM | POA: Diagnosis not present

## 2017-04-24 DIAGNOSIS — Z01812 Encounter for preprocedural laboratory examination: Secondary | ICD-10-CM | POA: Insufficient documentation

## 2017-04-24 DIAGNOSIS — K429 Umbilical hernia without obstruction or gangrene: Secondary | ICD-10-CM | POA: Insufficient documentation

## 2017-04-24 LAB — CBC WITH DIFFERENTIAL/PLATELET
BASOS PCT: 1 %
Basophils Absolute: 0 10*3/uL (ref 0–0.1)
EOS ABS: 0.1 10*3/uL (ref 0–0.7)
EOS PCT: 2 %
HCT: 39.5 % (ref 35.0–47.0)
Hemoglobin: 13 g/dL (ref 12.0–16.0)
Lymphocytes Relative: 46 %
Lymphs Abs: 1.7 10*3/uL (ref 1.0–3.6)
MCH: 31.6 pg (ref 26.0–34.0)
MCHC: 32.9 g/dL (ref 32.0–36.0)
MCV: 96.2 fL (ref 80.0–100.0)
MONO ABS: 0.3 10*3/uL (ref 0.2–0.9)
MONOS PCT: 9 %
Neutro Abs: 1.6 10*3/uL (ref 1.4–6.5)
Neutrophils Relative %: 42 %
Platelets: 210 10*3/uL (ref 150–440)
RBC: 4.11 MIL/uL (ref 3.80–5.20)
RDW: 15.4 % — AB (ref 11.5–14.5)
WBC: 3.8 10*3/uL (ref 3.6–11.0)

## 2017-04-24 LAB — BASIC METABOLIC PANEL
Anion gap: 8 (ref 5–15)
BUN: 17 mg/dL (ref 6–20)
CO2: 30 mmol/L (ref 22–32)
CREATININE: 0.8 mg/dL (ref 0.44–1.00)
Calcium: 9.8 mg/dL (ref 8.9–10.3)
Chloride: 103 mmol/L (ref 101–111)
GFR calc Af Amer: >60 mL/min (ref >60)
GFR calc non Af Amer: >60 mL/min (ref >60)
GLUCOSE: 105 mg/dL — AB (ref 65–99)
Potassium: 3.5 mmol/L (ref 3.5–5.1)
Sodium: 141 mmol/L (ref 135–145)

## 2017-04-25 ENCOUNTER — Other Ambulatory Visit: Payer: Self-pay

## 2017-04-25 ENCOUNTER — Telehealth: Payer: Self-pay | Admitting: Family Medicine

## 2017-04-25 NOTE — Telephone Encounter (Signed)
Lisa Galvan is wanting to know if the order was put in for her mammogram.

## 2017-04-26 NOTE — Telephone Encounter (Signed)
It was ordered on 04/05/17- if she has not heard from them, call [phone] and tell them that the order was put in on this date. They may have tried to call her and didn't get an answer

## 2017-04-29 DIAGNOSIS — R200 Anesthesia of skin: Secondary | ICD-10-CM | POA: Diagnosis not present

## 2017-04-29 DIAGNOSIS — E559 Vitamin D deficiency, unspecified: Secondary | ICD-10-CM | POA: Diagnosis not present

## 2017-04-29 DIAGNOSIS — E538 Deficiency of other specified B group vitamins: Secondary | ICD-10-CM | POA: Diagnosis not present

## 2017-04-29 DIAGNOSIS — G63 Polyneuropathy in diseases classified elsewhere: Secondary | ICD-10-CM | POA: Diagnosis not present

## 2017-04-30 MED ORDER — CEFAZOLIN SODIUM-DEXTROSE 2-4 GM/100ML-% IV SOLN
2.0000 g | INTRAVENOUS | Status: AC
  Administered 2017-05-01: 2 g via INTRAVENOUS

## 2017-05-01 ENCOUNTER — Encounter
Admission: RE | Disposition: A | Discharge status: Home / Self Care | Payer: Self-pay | Source: Ambulatory Visit | Attending: Surgery

## 2017-05-01 ENCOUNTER — Ambulatory Visit: Payer: BLUE CROSS/BLUE SHIELD | Admitting: Certified Registered Nurse Anesthetist

## 2017-05-01 ENCOUNTER — Ambulatory Visit
Admission: RE | Admit: 2017-05-01 | Discharge: 2017-05-01 | Disposition: A | Discharge status: Home / Self Care | Payer: BLUE CROSS/BLUE SHIELD | Source: Ambulatory Visit | Attending: Surgery | Admitting: Surgery

## 2017-05-01 DIAGNOSIS — K219 Gastro-esophageal reflux disease without esophagitis: Secondary | ICD-10-CM | POA: Diagnosis not present

## 2017-05-01 DIAGNOSIS — M79604 Pain in right leg: Secondary | ICD-10-CM | POA: Insufficient documentation

## 2017-05-01 DIAGNOSIS — N644 Mastodynia: Secondary | ICD-10-CM | POA: Diagnosis not present

## 2017-05-01 DIAGNOSIS — E782 Mixed hyperlipidemia: Secondary | ICD-10-CM | POA: Insufficient documentation

## 2017-05-01 DIAGNOSIS — Z803 Family history of malignant neoplasm of breast: Secondary | ICD-10-CM | POA: Diagnosis not present

## 2017-05-01 DIAGNOSIS — Z6830 Body mass index (BMI) 30.0-30.9, adult: Secondary | ICD-10-CM | POA: Insufficient documentation

## 2017-05-01 DIAGNOSIS — E119 Type 2 diabetes mellitus without complications: Secondary | ICD-10-CM | POA: Diagnosis not present

## 2017-05-01 DIAGNOSIS — F419 Anxiety disorder, unspecified: Secondary | ICD-10-CM | POA: Insufficient documentation

## 2017-05-01 DIAGNOSIS — Z888 Allergy status to other drugs, medicaments and biological substances status: Secondary | ICD-10-CM | POA: Insufficient documentation

## 2017-05-01 DIAGNOSIS — Z91013 Allergy to seafood: Secondary | ICD-10-CM | POA: Diagnosis not present

## 2017-05-01 DIAGNOSIS — Z794 Long term (current) use of insulin: Secondary | ICD-10-CM | POA: Insufficient documentation

## 2017-05-01 DIAGNOSIS — K429 Umbilical hernia without obstruction or gangrene: Secondary | ICD-10-CM | POA: Insufficient documentation

## 2017-05-01 DIAGNOSIS — E669 Obesity, unspecified: Secondary | ICD-10-CM | POA: Insufficient documentation

## 2017-05-01 DIAGNOSIS — N6310 Unspecified lump in the right breast, unspecified quadrant: Secondary | ICD-10-CM | POA: Diagnosis not present

## 2017-05-01 DIAGNOSIS — M79605 Pain in left leg: Secondary | ICD-10-CM | POA: Insufficient documentation

## 2017-05-01 DIAGNOSIS — I10 Essential (primary) hypertension: Secondary | ICD-10-CM | POA: Diagnosis not present

## 2017-05-01 DIAGNOSIS — Z79899 Other long term (current) drug therapy: Secondary | ICD-10-CM | POA: Insufficient documentation

## 2017-05-01 HISTORY — PX: UMBILICAL HERNIA REPAIR: SHX196

## 2017-05-01 LAB — GLUCOSE, CAPILLARY
GLUCOSE-CAPILLARY: 108 mg/dL — AB (ref 65–99)
Glucose-Capillary: 94 mg/dL (ref 65–99)

## 2017-05-01 SURGERY — REPAIR, HERNIA, UMBILICAL, ADULT
Anesthesia: General | Site: Abdomen | Wound class: Clean

## 2017-05-01 MED ORDER — ROCURONIUM BROMIDE 100 MG/10ML IV SOLN
INTRAVENOUS | Status: DC | PRN
  Administered 2017-05-01: 45 mg via INTRAVENOUS
  Administered 2017-05-01: 5 mg via INTRAVENOUS

## 2017-05-01 MED ORDER — OXYCODONE-ACETAMINOPHEN 5-325 MG PO TABS: 1.0000 | ORAL_TABLET | ORAL | 0 refills | Status: DC | PRN

## 2017-05-01 MED ORDER — PROPOFOL 10 MG/ML IV BOLUS
INTRAVENOUS | Status: DC | PRN
  Administered 2017-05-01: 160 mg via INTRAVENOUS

## 2017-05-01 MED ORDER — PROPOFOL 10 MG/ML IV BOLUS
INTRAVENOUS | Status: AC
  Filled 2017-05-01: qty 20

## 2017-05-01 MED ORDER — CHLORHEXIDINE GLUCONATE CLOTH 2 % EX PADS: 6.0000 | MEDICATED_PAD | Freq: Once | CUTANEOUS | Status: DC

## 2017-05-01 MED ORDER — SUGAMMADEX SODIUM 200 MG/2ML IV SOLN
INTRAVENOUS | Status: DC | PRN
  Administered 2017-05-01: 200 mg via INTRAVENOUS

## 2017-05-01 MED ORDER — OXYCODONE-ACETAMINOPHEN 5-325 MG PO TABS
ORAL_TABLET | ORAL | Status: AC
  Filled 2017-05-01: qty 1

## 2017-05-01 MED ORDER — OXYCODONE-ACETAMINOPHEN 5-325 MG PO TABS
1.0000 | ORAL_TABLET | ORAL | Status: DC | PRN
  Administered 2017-05-01: 1 via ORAL

## 2017-05-01 MED ORDER — ONDANSETRON HCL 4 MG/2ML IJ SOLN
INTRAMUSCULAR | Status: AC
  Filled 2017-05-01: qty 2

## 2017-05-01 MED ORDER — FENTANYL CITRATE (PF) 100 MCG/2ML IJ SOLN
25.0000 ug | INTRAMUSCULAR | Status: DC | PRN
  Administered 2017-05-01 (×3): 25 ug via INTRAVENOUS

## 2017-05-01 MED ORDER — PHENYLEPHRINE HCL 10 MG/ML IJ SOLN
INTRAMUSCULAR | Status: DC | PRN
  Administered 2017-05-01: 200 ug via INTRAVENOUS
  Administered 2017-05-01 (×2): 100 ug via INTRAVENOUS

## 2017-05-01 MED ORDER — BUPIVACAINE HCL (PF) 0.5 % IJ SOLN
INTRAMUSCULAR | Status: AC
  Filled 2017-05-01: qty 30

## 2017-05-01 MED ORDER — MIDAZOLAM HCL 2 MG/2ML IJ SOLN
INTRAMUSCULAR | Status: AC
  Filled 2017-05-01: qty 2

## 2017-05-01 MED ORDER — FENTANYL CITRATE (PF) 250 MCG/5ML IJ SOLN
INTRAMUSCULAR | Status: AC
  Filled 2017-05-01: qty 5

## 2017-05-01 MED ORDER — ONDANSETRON HCL 4 MG/2ML IJ SOLN
INTRAMUSCULAR | Status: DC | PRN
  Administered 2017-05-01: 4 mg via INTRAVENOUS

## 2017-05-01 MED ORDER — KETOROLAC TROMETHAMINE 30 MG/ML IJ SOLN
INTRAMUSCULAR | Status: DC | PRN
  Administered 2017-05-01: 30 mg via INTRAVENOUS

## 2017-05-01 MED ORDER — FENTANYL CITRATE (PF) 100 MCG/2ML IJ SOLN
INTRAMUSCULAR | Status: AC
  Administered 2017-05-01: 25 ug via INTRAVENOUS
  Filled 2017-05-01: qty 2

## 2017-05-01 MED ORDER — SUCCINYLCHOLINE CHLORIDE 20 MG/ML IJ SOLN
INTRAMUSCULAR | Status: DC | PRN
  Administered 2017-05-01: 80 mg via INTRAVENOUS

## 2017-05-01 MED ORDER — LIDOCAINE HCL 1 % IJ SOLN
INTRAMUSCULAR | Status: DC | PRN
  Administered 2017-05-01: 10 mL

## 2017-05-01 MED ORDER — LIDOCAINE HCL (PF) 1 % IJ SOLN
INTRAMUSCULAR | Status: AC
  Filled 2017-05-01: qty 30

## 2017-05-01 MED ORDER — DEXAMETHASONE SODIUM PHOSPHATE 10 MG/ML IJ SOLN
INTRAMUSCULAR | Status: DC | PRN
  Administered 2017-05-01: 5 mg via INTRAVENOUS

## 2017-05-01 MED ORDER — LIDOCAINE HCL (PF) 2 % IJ SOLN
INTRAMUSCULAR | Status: AC
  Filled 2017-05-01: qty 10

## 2017-05-01 MED ORDER — ROCURONIUM BROMIDE 50 MG/5ML IV SOLN
INTRAVENOUS | Status: AC
  Filled 2017-05-01: qty 1

## 2017-05-01 MED ORDER — SODIUM CHLORIDE 0.9 % IV SOLN
INTRAVENOUS | Status: DC
  Administered 2017-05-01: 13:00:00 via INTRAVENOUS

## 2017-05-01 MED ORDER — LIDOCAINE HCL (CARDIAC) 20 MG/ML IV SOLN
INTRAVENOUS | Status: DC | PRN
  Administered 2017-05-01: 80 mg via INTRAVENOUS

## 2017-05-01 MED ORDER — DEXAMETHASONE SODIUM PHOSPHATE 10 MG/ML IJ SOLN
INTRAMUSCULAR | Status: AC
  Filled 2017-05-01: qty 1

## 2017-05-01 MED ORDER — SUGAMMADEX SODIUM 200 MG/2ML IV SOLN
INTRAVENOUS | Status: AC
  Filled 2017-05-01: qty 2

## 2017-05-01 MED ORDER — MIDAZOLAM HCL 2 MG/2ML IJ SOLN
INTRAMUSCULAR | Status: DC | PRN
  Administered 2017-05-01: 2 mg via INTRAVENOUS

## 2017-05-01 MED ORDER — ONDANSETRON HCL 4 MG/2ML IJ SOLN: 4.0000 mg | Freq: Once | INTRAMUSCULAR | Status: DC | PRN

## 2017-05-01 MED ORDER — KETOROLAC TROMETHAMINE 30 MG/ML IJ SOLN
INTRAMUSCULAR | Status: AC
  Filled 2017-05-01: qty 1

## 2017-05-01 MED ORDER — FENTANYL CITRATE (PF) 100 MCG/2ML IJ SOLN
INTRAMUSCULAR | Status: DC | PRN
  Administered 2017-05-01: 50 ug via INTRAVENOUS
  Administered 2017-05-01: 100 ug via INTRAVENOUS
  Administered 2017-05-01 (×2): 50 ug via INTRAVENOUS

## 2017-05-01 MED ORDER — CEFAZOLIN SODIUM-DEXTROSE 2-4 GM/100ML-% IV SOLN
INTRAVENOUS | Status: AC
  Filled 2017-05-01: qty 100

## 2017-05-01 SURGICAL SUPPLY — 28 items
BAG HAMPER (MISCELLANEOUS) ×3 IMPLANT
CHLORAPREP W/TINT 26ML (MISCELLANEOUS) ×3 IMPLANT
COVER LIGHT HANDLE STERIS (MISCELLANEOUS) ×6 IMPLANT
DECANTER SPIKE VIAL GLASS SM (MISCELLANEOUS) ×6 IMPLANT
DERMABOND ADVANCED (GAUZE/BANDAGES/DRESSINGS) ×2
DERMABOND ADVANCED .7 DNX12 (GAUZE/BANDAGES/DRESSINGS) ×1 IMPLANT
ELECT REM PT RETURN 9FT ADLT (ELECTROSURGICAL) ×3
ELECTRODE REM PT RTRN 9FT ADLT (ELECTROSURGICAL) ×1 IMPLANT
GLOVE BIOGEL PI IND STRL 7.5 (GLOVE) ×1 IMPLANT
GLOVE BIOGEL PI INDICATOR 7.5 (GLOVE) ×2
GLOVE ECLIPSE 7.0 STRL STRAW (GLOVE) ×3 IMPLANT
GOWN STRL REUS W/TWL LRG LVL3 (GOWN DISPOSABLE) ×9 IMPLANT
KIT RM TURNOVER STRD PROC AR (KITS) ×3 IMPLANT
MESH VENTRALEX ST 2.5 CRC MED (Mesh General) ×3 IMPLANT
NEEDLE HYPO 25X1 1.5 SAFETY (NEEDLE) ×3 IMPLANT
NS IRRIG 1000ML POUR BTL (IV SOLUTION) ×3 IMPLANT
PACK BASIN MINOR ARMC (MISCELLANEOUS) ×3 IMPLANT
PAD ARMBOARD 7.5X6 YLW CONV (MISCELLANEOUS) ×3 IMPLANT
SUT ETHIBOND CT1 BRD 2-0 30IN (SUTURE) IMPLANT
SUT ETHIBOND NAB MO 7 #0 18IN (SUTURE) IMPLANT
SUT MNCRL AB 4-0 PS2 18 (SUTURE) ×3 IMPLANT
SUT VIC AB 2-0 CT2 27 (SUTURE) IMPLANT
SUT VIC AB 3-0 54X BRD REEL (SUTURE) IMPLANT
SUT VIC AB 3-0 BRD 54 (SUTURE)
SUT VIC AB 3-0 SH 27 (SUTURE)
SUT VIC AB 3-0 SH 27X BRD (SUTURE) IMPLANT
SYR 10ML LL (SYRINGE) ×3 IMPLANT
SYR BULB IRRIG 60ML STRL (SYRINGE) IMPLANT

## 2017-05-01 NOTE — Transfer of Care (Signed)
Immediate Anesthesia Transfer of Care Note  Patient: Lisa Galvan  Procedure(s) Performed: HERNIA REPAIR UMBILICAL ADULT (N/A Abdomen)  Patient Location: PACU  Anesthesia Type:General  Level of Consciousness: awake, alert , oriented and patient cooperative  Airway & Oxygen Therapy: Patient Spontanous Breathing and Patient connected to face mask oxygen  Post-op Assessment: Report given to RN and Post -op Vital signs reviewed and stable  Post vital signs: Reviewed and stable  Last Vitals:  Vitals:   05/01/17 1225  BP: (!) 120/92  Pulse: 79  Resp: 15  Temp: 36.9 C  SpO2: 100%    Last Pain:  Vitals:   05/01/17 1225  TempSrc: Oral         Complications: No apparent anesthesia complications

## 2017-05-01 NOTE — Anesthesia Post-op Follow-up Note (Signed)
Anesthesia QCDR form completed.        

## 2017-05-01 NOTE — Anesthesia Procedure Notes (Signed)
Procedure Name: Intubation Date/Time: 05/01/2017 1:07 PM Performed by: Eben Burow, CRNA Pre-anesthesia Checklist: Patient identified, Emergency Drugs available, Suction available, Patient being monitored and Timeout performed Patient Re-evaluated:Patient Re-evaluated prior to induction Oxygen Delivery Method: Circle system utilized Preoxygenation: Pre-oxygenation with 100% oxygen Induction Type: IV induction Ventilation: Mask ventilation without difficulty Laryngoscope Size: Mac and 3 Grade View: Grade II Tube type: Oral Tube size: 7.0 mm Number of attempts: 1 Airway Equipment and Method: Stylet and LTA kit utilized Placement Confirmation: ETT inserted through vocal cords under direct vision,  positive ETCO2 and breath sounds checked- equal and bilateral Secured at: 22 cm Tube secured with: Tape Dental Injury: Teeth and Oropharynx as per pre-operative assessment

## 2017-05-01 NOTE — Interval H&P Note (Signed)
History and Physical Interval Note:  05/01/2017 12:17 PM  Lisa HamSharon Loud  has presented today for surgery, with the diagnosis of UMBILICAL HERNIA  The various methods of treatment have been discussed with the patient and family. After consideration of risks, benefits and other options for treatment, the patient has consented to  Procedure(s): HERNIA REPAIR UMBILICAL ADULT (N/A) as a surgical intervention .  The patient's history has been reviewed, patient examined, no change in status, stable for surgery.  I have reviewed the patient's chart and labs.  Questions were answered to the patient's satisfaction.     Ancil LinseyJason Evan Gwin Eagon

## 2017-05-01 NOTE — Op Note (Signed)
SURGICAL OPERATIVE REPORT  DATE OF PROCEDURE: 05/01/2017  ATTENDING Surgeon(s): Ancil Linseyavis, Arin Vanosdol Evan, MD  ANESTHESIA: GETA  PRE-OPERATIVE DIAGNOSIS: Symptomatic (painful) reducible umbilical hernia (icd-10: K42.9)  POST-OPERATIVE DIAGNOSIS: Symptomatic (painful) reducible umbilical hernia (icd-10: K42.9)  PROCEDURE(S):  1.) Open repair of symptomatic (painful) umbilical hernia with mesh (cpt: [id number])  INTRAOPERATIVE FINDINGS: 1.5 cm umbilical and 1 cm supra-umbical hernia with adherent omentum and hernia sac  INTRAVENOUS FLUIDS: 800 mL crystalloid   ESTIMATED BLOOD LOSS: Minimal (<20 mL)   URINE OUTPUT: No Foley catheter  SPECIMENS: None  IMPLANTS: 6.4 cm Bard Ventralex ventral hernia repair mesh  DRAINS: None  COMPLICATIONS: None apparent  CONDITION AT END OF PROCEDURE: Hemodynamically stable and extubated  DISPOSITION OF PATIENT: PACU  INDICATIONS FOR PROCEDURE:  Patient is a 57 y.o. female who presented upon referral from her PMD for evaluation of patient's umbilical hernia. She denies abdominal distention, change in bowel function, or N/V and likewise denies straining with urination, coughing, or constipation (though patient on day of surgery for the first time stated it had been 3 days since her prior BM), and she expressed that she'd like to have it repaired. All risks, benefits, and alternatives to above elective procedure were discussed with the patient, all of patient's questions were answered to her expressed satisfaction, and informed consent was obtained and documented accordingly.  DETAILS OF PROCEDURE: Patient was brought to the operating suite and appropriately identified. General anesthesia was administered along with appropriate pre-operative antibiotics, and endotracheal intubation was performed by anesthetist. In supine position, operative site was prepped and draped in the usual sterile fashion, and following a brief time out, local anesthetic was injected  inferior to the umbilicus, and a 3 cm transverse curvilinear incision was made using a #15 blade scalpel and extended deep through subcutaneous tissues using blunt dissection and electrocautery. The hernia sac was then dissected from the undersurface of the umbilical skin and stalk, and the 1.5 cm fascial defect was cleared of all omental adhesions. Upon palpating the peritoneal surface to confirm no adherent bowel or omentum, a second somewhat smaller 1 cm supraumbilical transfascial defect was appreciated with no adherent fat or intestine surrounding either defect. Accordingly, a 6.4 cm Bard Ventralex mesh patch was selected, inserted, confirmed to approximate well to fascial edges and cover both defects without intervening viscera or omentum, and patch was secured without tension using Ethilon braided non-absorbable suture. The umbilicus was then invaginated using 2-0 Vicryl suture from the dermis to fascia, and the wound was irrigated using sterile saline. Overlying tissues were re-approximated in layers using buried interrupted 3-0 Vicryl suture for dermis, skin was cleaned and dried, and sterile Dermabond skin glue was applied and allowed to dry.  Patient was then safely able to be extubated, awakened, and transferred to PACU for post-operative monitoring and care.  I was present for all aspects of the above procedure, and there were no complications apparent.

## 2017-05-01 NOTE — Anesthesia Preprocedure Evaluation (Signed)
Anesthesia Evaluation  Patient identified by MRN, date of birth, ID band Patient awake    Reviewed: Allergy & Precautions, H&P , NPO status , Patient's Chart, lab work & pertinent test results, reviewed documented beta blocker date and time   Airway Mallampati: II  TM Distance: >3 FB Neck ROM: full    Dental  (+) Teeth Intact   Pulmonary neg pulmonary ROS,    Pulmonary exam normal        Cardiovascular hypertension, negative cardio ROS Normal cardiovascular exam Rhythm:regular Rate:Normal     Neuro/Psych  Neuromuscular disease negative neurological ROS  negative psych ROS   GI/Hepatic negative GI ROS, Neg liver ROS, GERD  Medicated,  Endo/Other  negative endocrine ROSdiabetes  Renal/GU negative Renal ROS  negative genitourinary   Musculoskeletal   Abdominal   Peds  Hematology negative hematology ROS (+)   Anesthesia Other Findings Past Medical History: No date: Arthritis     Comment:  spine No date: Bilateral leg edema 12/15/2014: Chest pain 12/10/2014: Chronic sciatica No date: Diabetes mellitus without complication (HCC) 12/10/2014: Essential hypertension 12/10/2014: Essential hypertension No date: Gastroesophageal reflux disease without esophagitis No date: GERD (gastroesophageal reflux disease) No date: Hyperlipidemia No date: Hypertension No date: Mixed hyperlipidemia 12/10/2014: Obesity (BMI 30.0-34.9) 12/10/2014: Type 2 diabetes mellitus without complication (HCC) Past Surgical History: No date: ABDOMINAL HYSTERECTOMY 03/12/2017: ESOPHAGOGASTRODUODENOSCOPY (EGD) WITH PROPOFOL; N/A     Comment:  Procedure: ESOPHAGOGASTRODUODENOSCOPY (EGD) WITH               PROPOFOL;  Surgeon: Toney ReilVanga, Rohini Reddy, MD;  Location:               [facility] ENDOSCOPY;  Service: Gastroenterology;  Laterality:               N/A; BMI    Body Mass Index:  30.29 kg/m     Reproductive/Obstetrics negative OB ROS                              Anesthesia Physical Anesthesia Plan  ASA: III  Anesthesia Plan: General ETT   Post-op Pain Management:    Induction:   PONV Risk Score and Plan:   Airway Management Planned:   Additional Equipment:   Intra-op Plan:   Post-operative Plan:   Informed Consent: I have reviewed the patients History and Physical, chart, labs and discussed the procedure including the risks, benefits and alternatives for the proposed anesthesia with the patient or authorized representative who has indicated his/her understanding and acceptance.   Dental Advisory Given  Plan Discussed with: CRNA  Anesthesia Plan Comments:         Anesthesia Quick Evaluation

## 2017-05-01 NOTE — Discharge Instructions (Addendum)
In addition to included general post-operative instructions for Open Repair of Umbilical Hernia (x2) with Mesh,  Diet: Resume home heart healthy diet.   Activity: No heavy lifting >15 pounds (children, pets, laundry, garbage) or strenuous activity until follow-up (x 2 weeks and no heavy lifting >40 lbs x 4 - 6 weeks), but light activity and walking are encouraged. Do not drive or drink alcohol if taking narcotic pain medications.  Wound care: 2 days after surgery (Friday, 1/11), may shower/get incision wet with soapy water and pat dry (do not rub incisions), but no baths or submerging incision underwater until follow-up.   Medications: Resume all home medications. For mild to moderate pain: acetaminophen (Tylenol) or ibuprofen/naproxen (if no kidney disease). Combining Tylenol with alcohol can substantially increase your risk of causing liver disease. Narcotic pain medications, if prescribed, can be used for severe pain, though may cause nausea, constipation, and drowsiness. To prevent worsening of constipation while taking narcotics, may take Colace stool softener as needed. If constipated, take over-the-counter Miralax or magnesium citrate as needed for constipation. Do not combine Tylenol and Percocet (or similar) within a 6 hour period as Percocet (and similar) contain(s) Tylenol. If you do not need the narcotic pain medication, you do not need to fill the prescription.  Call office [phone]) at any time if any questions, worsening pain, fevers/chills, bleeding, drainage from incision site, or other concerns. Don't forget to schedule your colonoscopy due in the near future, and please ask our office or your primary care physician's office if you need a referral for colonoscopy.    AMBULATORY SURGERY  DISCHARGE INSTRUCTIONS   1) The drugs that you were given will stay in your system until tomorrow so for the next 24 hours you should not:  A) Drive an automobile B) Make any legal  decisions C) Drink any alcoholic beverage   2) You may resume regular meals tomorrow.  Today it is better to start with liquids and gradually work up to solid foods.  You may eat anything you prefer, but it is better to start with liquids, then soup and crackers, and gradually work up to solid foods.   3) Please notify your doctor immediately if you have any unusual bleeding, trouble breathing, redness and pain at the surgery site, drainage, fever, or pain not relieved by medication.    4) Additional Instructions:        Please contact your physician with any problems or Same Day Surgery at [phone], Monday through Friday 6 am to 4 pm, or [place] [facility] Main number at [phone].

## 2017-05-02 ENCOUNTER — Encounter: Payer: Self-pay | Admitting: Surgery

## 2017-05-03 ENCOUNTER — Ambulatory Visit
Admission: RE | Admit: 2017-05-03 | Discharge: 2017-05-03 | Disposition: A | Discharge status: Home / Self Care | Payer: BLUE CROSS/BLUE SHIELD | Source: Ambulatory Visit | Attending: Family Medicine | Admitting: Family Medicine

## 2017-05-03 DIAGNOSIS — R922 Inconclusive mammogram: Secondary | ICD-10-CM | POA: Diagnosis not present

## 2017-05-03 DIAGNOSIS — N644 Mastodynia: Secondary | ICD-10-CM

## 2017-05-03 DIAGNOSIS — N6489 Other specified disorders of breast: Secondary | ICD-10-CM | POA: Diagnosis not present

## 2017-05-05 NOTE — Anesthesia Postprocedure Evaluation (Signed)
Anesthesia Post Note  Patient: Maxcine HamSharon Crigler  Procedure(s) Performed: HERNIA REPAIR UMBILICAL ADULT (N/A Abdomen)  Patient location during evaluation: PACU Anesthesia Type: General Level of consciousness: awake and alert Pain management: pain level controlled Vital Signs Assessment: post-procedure vital signs reviewed and stable Respiratory status: spontaneous breathing, nonlabored ventilation, respiratory function stable and patient connected to nasal cannula oxygen Cardiovascular status: blood pressure returned to baseline and stable Postop Assessment: no apparent nausea or vomiting Anesthetic complications: no     Last Vitals:  Vitals:   05/01/17 1532 05/01/17 1607  BP: 131/82 115/72  Pulse: 72 76  Resp: 14 14  Temp: (!) 36.3 C 36.5 C  SpO2: 96% 98%    Last Pain:  Vitals:   05/02/17 0918  TempSrc:   PainSc: 7                  Yevette EdwardsJames G Adams

## 2017-05-06 DIAGNOSIS — E538 Deficiency of other specified B group vitamins: Secondary | ICD-10-CM | POA: Diagnosis not present

## 2017-05-07 ENCOUNTER — Other Ambulatory Visit: Payer: Self-pay | Admitting: Gastroenterology

## 2017-05-10 DIAGNOSIS — K429 Umbilical hernia without obstruction or gangrene: Secondary | ICD-10-CM

## 2017-05-13 ENCOUNTER — Encounter: Payer: Self-pay | Admitting: Gastroenterology

## 2017-05-13 ENCOUNTER — Ambulatory Visit: Payer: BLUE CROSS/BLUE SHIELD | Admitting: Gastroenterology

## 2017-05-13 ENCOUNTER — Other Ambulatory Visit: Payer: Self-pay

## 2017-05-13 VITALS — BP 118/82 | HR 81 | Temp 97.9°F | Ht 62.0 in | Wt 171.0 lb

## 2017-05-13 DIAGNOSIS — B9681 Helicobacter pylori [H. pylori] as the cause of diseases classified elsewhere: Secondary | ICD-10-CM

## 2017-05-13 DIAGNOSIS — A048 Other specified bacterial intestinal infections: Secondary | ICD-10-CM | POA: Insufficient documentation

## 2017-05-13 DIAGNOSIS — E538 Deficiency of other specified B group vitamins: Secondary | ICD-10-CM | POA: Diagnosis not present

## 2017-05-13 DIAGNOSIS — K2970 Gastritis, unspecified, without bleeding: Secondary | ICD-10-CM

## 2017-05-13 NOTE — Progress Notes (Signed)
Arlyss Repress, MD [address]  [address]  [place], [place] [address]  Main: [phone]  Fax: [phone]    Gastroenterology Consultation  Referring Provider:     Duanne Limerick, MD Primary Care Physician:  Duanne Limerick, MD Primary Gastroenterologist:  Dr. Arlyss Repress  Reason for Consultation:     GERD        HPI:   Lisa Galvan is a 57 y.o. female referred for consultation & management  by Dr. Duanne Limerick, MD.  She has several years of heartburn, that has worsened over one year, describes it as pain in the chest radiating to right side as well as throat pain, worse laying flat. The pain is worse after eating or drinking something, denies any radiation to the back. She has previously tried Tums, Pepcid, she has been on Protonix for about 4 years was taking at 9 AM and 9 PM not associated with timing of food. She does not find protonix to be effective anymore. She was taking Nexium prior to protonix but changed when it became over-the-counter medication. She reports having sore taste in mouth, belching, feels like something is constantly moving down her throat. She had recent exacerbation and went to the emergency room last month. She has gained a few pounds in last few months. She does not drink sodas, no alcohol no smoking. She can tolerate tea, wine.  She goes to gym 3 times a week but she does consume high carb diet, likes to eat a lot of cheese.  Follow-up visit 05/13/2017: She underwent umbilical hernia repair and is doing well. Since last visit, patient underwent upper endoscopy which revealed H. pylori infection, treated with triple therapy. She denies having reflux symptoms on omeprazole 40 mg daily.  GI Procedures: Colonoscopy ~2013 in [place] EGD ~2011, reportedly normal  EGD 03/12/17: - Normal duodenal bulb and second portion of the duodenum. - Normal stomach. Biopsied. - Normal gastroesophageal junction and esophagus. DIAGNOSIS:  A. STOMACH; RANDOM  COLD BIOPSY:  - HELICOBACTER PYLORI-ASSOCIATED GASTRITIS, WITH MILD CHRONIC ACTIVE  INFLAMMATION.  - NEGATIVE FOR INTESTINAL METAPLASIA, ATROPHY, DYSPLASIA, AND  MALIGNANCY.  - H. PYLORI BACTERIA ARE SEEN IN HEMATOXYLIN AND EOSIN SECTIONS.  Denies family history of esophageal cancer, stomach cancer, colon cancer  Past Medical History:  Diagnosis Date  . Arthritis    spine  . Bilateral leg edema   . Chest pain 12/15/2014  . Chronic sciatica 12/10/2014  . Diabetes mellitus without complication (HCC)   . Essential hypertension 12/10/2014  . Essential hypertension 12/10/2014  . Gastroesophageal reflux disease without esophagitis   . GERD (gastroesophageal reflux disease)   . Hyperlipidemia   . Hypertension   . Mixed hyperlipidemia   . Obesity (BMI 30.0-34.9) 12/10/2014  . Type 2 diabetes mellitus without complication (HCC) 12/10/2014    Past Surgical History:  Procedure Laterality Date  . ABDOMINAL HYSTERECTOMY    . ESOPHAGOGASTRODUODENOSCOPY (EGD) WITH PROPOFOL N/A 03/12/2017   Procedure: ESOPHAGOGASTRODUODENOSCOPY (EGD) WITH PROPOFOL;  Surgeon: Toney Reil, MD;  Location: [facility] ENDOSCOPY;  Service: Gastroenterology;  Laterality: N/A;  . UMBILICAL HERNIA REPAIR N/A 05/01/2017   Procedure: HERNIA REPAIR UMBILICAL ADULT;  Surgeon: Ancil Linsey, MD;  Location: [facility] ORS;  Service: General;  Laterality: N/A;    Prior to Admission medications   Medication Sig Start Date End Date Taking? Authorizing Provider  amLODipine (NORVASC) 5 MG tablet TAKE 1 TABLET(5 MG) BY MOUTH DAILY 07/03/16  Yes Duanne Limerick, MD  atorvastatin (LIPITOR) 20 MG tablet Take 1 tablet (20 mg total) by mouth daily. 07/03/16  Yes Duanne LimerickJones, Deanna C, MD  empagliflozin (JARDIANCE) 10 MG TABS tablet Take 10 mg by mouth daily. 07/03/16  Yes Duanne LimerickJones, Deanna C, MD  EPINEPHrine (EPIPEN 2-PAK) 0.3 mg/0.3 mL IJ SOAJ injection Inject 0.3 mLs (0.3 mg total) into the muscle once. Take for severe allergic reaction, then come  immediately to the Emergency Department or call 911. 03/24/15  Yes Loleta RoseForbach, Cory, MD  fexofenadine (ALLEGRA) 180 MG tablet Take 180 mg by mouth daily.   Yes [provider]  metFORMIN (GLUCOPHAGE) 500 MG tablet Take 1 tablet (500 mg total) by mouth 2 (two) times daily with a meal. 07/03/16  Yes Duanne LimerickJones, Deanna C, MD  Olopatadine HCl 0.2 % SOLN Apply to eye. 03/18/15  Yes [provider]  pantoprazole (PROTONIX) 40 MG tablet TAKE 1 TABLET(40 MG) BY MOUTH TWICE DAILY 08/14/16  Yes Duanne LimerickJones, Deanna C, MD  pioglitazone (ACTOS) 30 MG tablet Take 1 tablet (30 mg total) by mouth daily. 07/03/16  Yes Duanne LimerickJones, Deanna C, MD  VITAMIN D, CHOLECALCIFEROL, PO Take 1 capsule by mouth daily.   Yes [provider]  fluconazole (DIFLUCAN) 100 MG tablet Take 1 tablet (100 mg total) by mouth daily. Patient not taking: Reported on 12/07/2016 11/08/16 12/08/16  Myrna BlazerSchaevitz, David Matthew, MD  JARDIANCE 10 MG TABS tablet TAKE 1 TABLET BY MOUTH DAILY Patient not taking: Reported on 12/07/2016 07/26/16   Duanne LimerickJones, Deanna C, MD  sucralfate (CARAFATE) 1 GM/10ML suspension Take 10 mLs (1 g total) by mouth 3 (three) times daily with meals as needed (pain with swallowing). Patient not taking: Reported on 12/07/2016 11/08/16 11/08/17  Myrna BlazerSchaevitz, David Matthew, MD  triamterene-hydrochlorothiazide (MAXZIDE-25) 37.5-25 MG tablet Take 0.5 tablets by mouth daily. Patient not taking: Reported on 12/07/2016 07/03/16   Duanne LimerickJones, Deanna C, MD  valACYclovir (VALTREX) 500 MG tablet Take 500 mg by mouth 2 (two) times daily. As needed outbreak    [provider]    Family History  Problem Relation Age of Onset  . Diabetes Mother   . Cancer Mother        breast and stomach  . Breast cancer Mother [id number]  . Heart disease Father   . Kidney disease Father        dialysis  . Cancer Father        colon     Social History   Tobacco Use  . Smoking status: Never Smoker  . Smokeless tobacco: Never Used  Substance Use Topics  .  Alcohol use: Yes    Alcohol/week: 1.2 oz    Types: 2 Glasses of wine per week    Comment: rare wine  . Drug use: No    Allergies as of 05/13/2017 - Review Complete 05/01/2017  Allergen Reaction Noted  . Amlodipine besy-benazepril hcl Anaphylaxis 07/04/2015  . Other Anaphylaxis 07/04/2015  . Shellfish allergy Swelling and Anaphylaxis 12/15/2014  . Shellfish-derived products Anaphylaxis 12/15/2014  . Ace inhibitors Swelling 06/05/2016    Review of Systems:    All systems reviewed and negative except where noted in HPI.   Physical Exam:  BP 118/82   Pulse 81   Temp 97.9 F (36.6 C) (Oral)   Ht 5\' 2"  (1.575 m)   Wt 171 lb (77.6 kg)   BMI 31.28 kg/m  No LMP recorded. Patient has had a hysterectomy.  General:   Alert,  Well-developed, well-nourished, pleasant and cooperative in NAD Head:  Normocephalic and  atraumatic. Eyes:  Sclera clear, no icterus.   Conjunctiva pink. Ears:  Normal auditory acuity. Nose:  No deformity, discharge, or lesions. Mouth:  No deformity or lesions,oropharynx pink & moist. Neck:  Supple; no masses or thyromegaly. Lungs:  Respirations even and unlabored.  Clear throughout to auscultation.   No wheezes, crackles, or rhonchi. No acute distress. Heart:  Regular rate and rhythm; no murmurs, clicks, rubs, or gallops. Abdomen:  Normal bowel sounds.  No bruits.  Soft, non-tender and non-distended without masses, hepatosplenomegaly or hernias noted.  No guarding or rebound tenderness.    Msk:  Symmetrical without gross deformities. Good, equal movement & strength bilaterally. Pulses:  Normal pulses noted. Extremities:  No clubbing or edema.  No cyanosis. Neurologic:  Alert and oriented x3;  grossly normal neurologically. Skin:  Intact without significant lesions or rashes. No jaundice. Lymph Nodes:  No significant cervical adenopathy. Psych:  Alert and cooperative. Normal mood and affect.  Imaging Studies: US Breast Ltd Uni Left Inc Axilla  Result Date:  05/03/2017 CLINICAL DATA:  Patient has focal areas of thickening bilaterally in the 12 o'clock location; focal area of pain in the 6 o'clock location of the right breast. EXAM: 2D DIGITAL DIAGNOSTIC BILATERAL MAMMOGRAM WITH CAD AND ADJUNCT TOMO ULTRASOUND BILATERAL BREAST COMPARISON:  08/20/2016 ACR Breast Density Category c: The breast tissue is heterogeneously dense, which may obscure small masses. FINDINGS: No suspicious mass, distortion, or microcalcifications are identified to suggest presence of malignancy. Mammographic images were processed with CAD. On physical exam, I palpate soft thickening in the 12 o'clock locations bilaterally. I palpate no discrete abnormality in the 6 o'clock location of the right breast were the patient is tender. Targeted ultrasound is performed, showing normal appearing dense fibroglandular tissue in the 12 o'clock location of the right breast. Normal appearing fibroglandular tissue is imaged in the 6 o'clock location of the right breast. Targeted ultrasound is performed showing normal appearing dense fibroglandular tissue in the 12 o'clock location of the left breast. No mass, distortion, or acoustic shadowing is demonstrated with ultrasound. IMPRESSION: No mammographic or ultrasound evidence for malignancy. RECOMMENDATION: Screening mammogram in one year.(Code:SM-B-01Y) I have discussed the findings and recommendations with the patient. Results were also provided in writing at the conclusion of the visit. If applicable, a reminder letter will be sent to the patient regarding the next appointment. BI-RADS CATEGORY  1: Negative. Electronically Signed   By: Norva Pavlov M.D.   On: 05/03/2017 11:16   US Breast Ltd Uni Right Inc Axilla  Result Date: 05/03/2017 CLINICAL DATA:  Patient has focal areas of thickening bilaterally in the 12 o'clock location; focal area of pain in the 6 o'clock location of the right breast. EXAM: 2D DIGITAL DIAGNOSTIC BILATERAL MAMMOGRAM WITH CAD AND  ADJUNCT TOMO ULTRASOUND BILATERAL BREAST COMPARISON:  08/20/2016 ACR Breast Density Category c: The breast tissue is heterogeneously dense, which may obscure small masses. FINDINGS: No suspicious mass, distortion, or microcalcifications are identified to suggest presence of malignancy. Mammographic images were processed with CAD. On physical exam, I palpate soft thickening in the 12 o'clock locations bilaterally. I palpate no discrete abnormality in the 6 o'clock location of the right breast were the patient is tender. Targeted ultrasound is performed, showing normal appearing dense fibroglandular tissue in the 12 o'clock location of the right breast. Normal appearing fibroglandular tissue is imaged in the 6 o'clock location of the right breast. Targeted ultrasound is performed showing normal appearing dense fibroglandular tissue in the 12 o'clock location of  the left breast. No mass, distortion, or acoustic shadowing is demonstrated with ultrasound. IMPRESSION: No mammographic or ultrasound evidence for malignancy. RECOMMENDATION: Screening mammogram in one year.(Code:SM-B-01Y) I have discussed the findings and recommendations with the patient. Results were also provided in writing at the conclusion of the visit. If applicable, a reminder letter will be sent to the patient regarding the next appointment. BI-RADS CATEGORY  1: Negative. Electronically Signed   By: Norva Pavlov M.D.   On: 05/03/2017 11:16   Mm Diag Breast Tomo Bilateral  Result Date: 05/03/2017 CLINICAL DATA:  Patient has focal areas of thickening bilaterally in the 12 o'clock location; focal area of pain in the 6 o'clock location of the right breast. EXAM: 2D DIGITAL DIAGNOSTIC BILATERAL MAMMOGRAM WITH CAD AND ADJUNCT TOMO ULTRASOUND BILATERAL BREAST COMPARISON:  08/20/2016 ACR Breast Density Category c: The breast tissue is heterogeneously dense, which may obscure small masses. FINDINGS: No suspicious mass, distortion, or microcalcifications  are identified to suggest presence of malignancy. Mammographic images were processed with CAD. On physical exam, I palpate soft thickening in the 12 o'clock locations bilaterally. I palpate no discrete abnormality in the 6 o'clock location of the right breast were the patient is tender. Targeted ultrasound is performed, showing normal appearing dense fibroglandular tissue in the 12 o'clock location of the right breast. Normal appearing fibroglandular tissue is imaged in the 6 o'clock location of the right breast. Targeted ultrasound is performed showing normal appearing dense fibroglandular tissue in the 12 o'clock location of the left breast. No mass, distortion, or acoustic shadowing is demonstrated with ultrasound. IMPRESSION: No mammographic or ultrasound evidence for malignancy. RECOMMENDATION: Screening mammogram in one year.(Code:SM-B-01Y) I have discussed the findings and recommendations with the patient. Results were also provided in writing at the conclusion of the visit. If applicable, a reminder letter will be sent to the patient regarding the next appointment. BI-RADS CATEGORY  1: Negative. Electronically Signed   By: Norva Pavlov M.D.   On: 05/03/2017 11:16   Mm Outside Films Mammo  Result Date: 04/18/2017 This examination belongs to an outside facility and is stored here for comparison purposes only.  Contact the originating outside institution for any associated report or interpretation.   Assessment and Plan:   Lisa Galvan is a 57 y.o. black  female with chronic GERD without esophagitis here for follow-up.  GERD: Symptoms are well controlled on daily PPI -EGD normal -Continue antireflux measures and healthy lifestyle  H. pylori infection: Status post-triple therapy -She will stay off omeprazole for 2-3 weeks to perform H. pylori breath test    Follow up in 3 months   Arlyss Repress, MD

## 2017-05-15 ENCOUNTER — Encounter: Payer: Self-pay | Admitting: Surgery

## 2017-05-15 ENCOUNTER — Ambulatory Visit (INDEPENDENT_AMBULATORY_CARE_PROVIDER_SITE_OTHER): Payer: BLUE CROSS/BLUE SHIELD | Admitting: Surgery

## 2017-05-15 VITALS — BP 110/76 | HR 76 | Temp 97.6°F | Ht 62.0 in | Wt 173.2 lb

## 2017-05-15 DIAGNOSIS — K429 Umbilical hernia without obstruction or gangrene: Secondary | ICD-10-CM

## 2017-05-15 NOTE — Progress Notes (Signed)
Surgical Clinic Progress/Follow-up Note   HPI:  57 y.o. Female presents to clinic for post-op follow-up evaluation 2 weeks s/p open repair of symptomatic umbilical hernia with mesh. Patient reports her pre-operative pain has completely resolved with no further umbilical bulge, and she expresses satisfaction with again having an inwardly oriented umbilicus. She also says her initial post-operative pain has resolved without any further need for narcotic pain medications, and the milk of magnesia advised at time of surgery for chronic constipation despite once daily Colace did help, though she again continues to be constipated with BM every 3 days. Patient again denies any blood per rectum and recalls her last colonoscopy was 8 years ago. She otherwise denies fever/chills, N/V, CP, or SOB.  Review of Systems:  Constitutional: denies any other weight loss, fever, chills, or sweats  Eyes: denies any other vision changes, history of eye injury  ENT: denies sore throat, hearing problems  Respiratory: denies shortness of breath, wheezing  Cardiovascular: denies chest pain, palpitations  Gastrointestinal: abdominal pain, N/V, and bowel function as per HPI Musculoskeletal: denies any other joint pains or cramps  Skin: Denies any other rashes or skin discolorations  Neurological: denies any other headache, dizziness, weakness  Psychiatric: denies any other depression, anxiety  All other review of systems: otherwise negative   Vital Signs:  BP 110/76   Pulse 76   Temp 97.6 F (36.4 C) (Oral)   Ht 5\' 2"  (1.575 m)   Wt 173 lb 3.2 oz (78.6 kg)   BMI 31.68 kg/m    Physical Exam:  Constitutional:  -- Overweight body habitus  -- Awake, alert, and oriented x3  Eyes:  -- Pupils equally round and reactive to light  -- No scleral icterus  Ear, nose, throat:  -- No jugular venous distension  -- No nasal drainage, bleeding Pulmonary:  -- No crackles -- Equal breath sounds bilaterally --  Breathing non-labored at rest Cardiovascular:  -- S1, S2 present  -- No pericardial rubs  Gastrointestinal:  -- Soft, nontender, non-distended, no guarding/rebound  -- Umbilical incision well-approximated without any surrounding erythema or drainage, no detectable hernia, though hard edges of the mesh are palpable beyond the former umbilical and supraumbilical defects -- No abdominal masses appreciated, pulsatile or otherwise  Musculoskeletal / Integumentary:  -- Wounds or skin discoloration: None appreciated except as described above (GI)  -- Extremities: B/L UE and LE FROM, hands and feet warm, no edema  Neurologic:  -- Motor function: intact and symmetric  -- Sensation: intact and symmetric   Assessment:  57 y.o. yo Female with a problem list including...  Patient Active Problem List   Diagnosis Date Noted  . Helicobacter pylori infection 05/13/2017  . Umbilical hernia without obstruction or gangrene   . Mastalgia in female 04/09/2017  . Bilateral leg edema 07/02/2016  . DDD (degenerative disc disease), lumbar 12/16/2014  . Facet syndrome, lumbar 12/16/2014  . Sacroiliac joint dysfunction 12/16/2014  . Neuropathy due to secondary diabetes (HCC) 12/16/2014  . Chest pain 12/15/2014  . Mixed hyperlipidemia 12/15/2014  . Vitamin D deficiency 12/13/2014  . Obesity (BMI 30.0-34.9) 12/10/2014  . Chronic sciatica 12/10/2014  . Type 2 diabetes mellitus without complication (HCC) 12/10/2014  . Hyperlipidemia 12/10/2014  . Gastroesophageal reflux disease without esophagitis 12/10/2014    presents to clinic for post-op evaluation with chronic constipation despite once daily Colace, otherwise doing well 2 weeks s/p open repair of symptomatic umbilical hernia with mesh.  Plan:   - increase Colace to twice daily  with Miralax once daily prn  - follow-up with PMD regarding ongoing constipation and to arrange colonoscopy  - maintain hydration with high-fiber heart-healthy diet and may  submerge incisions under water prn  - no heavy lifting >40 lbs over next 2 weeks, after which okay to gradually resume activities without restrictions  - return to clinic as needed, instructed to call office if any questions or concerns  All of the above recommendations were discussed with the patient, and all of patient's questions were answered to her expressed satisfaction.  -- Scherrie Gerlach Earlene Plater, MD, RPVI [place]: [facility] - Partnering for exceptional care. Office: [phone]

## 2017-05-15 NOTE — Patient Instructions (Signed)
GENERAL POST-OPERATIVE PATIENT INSTRUCTIONS   WOUND CARE INSTRUCTIONS:  Keep a dry clean dressing on the wound if there is drainage. The initial bandage may be removed after 24 hours.  Once the wound has quit draining you may leave it open to air.  If clothing rubs against the wound or causes irritation and the wound is not draining you may cover it with a dry dressing during the daytime.  Try to keep the wound dry and avoid ointments on the wound unless directed to do so.  If the wound becomes bright red and painful or starts to drain infected material that is not clear, please contact your physician immediately.  If the wound is mildly pink and has a thick firm ridge underneath it, this is normal, and is referred to as a healing ridge.  This will resolve over the next 4-6 weeks.  BATHING: You may shower if you have been informed of this by your surgeon. However, Please do not submerge in a tub, hot tub, or pool until incisions are completely sealed or have been told by your surgeon that you may do so.  DIET:  You may eat any foods that you can tolerate.  It is a good idea to eat a high fiber diet and take in plenty of fluids to prevent constipation.  If you do become constipated you may want to take a mild laxative or take ducolax tablets on a daily basis until your bowel habits are regular.  Constipation can be very uncomfortable, along with straining, after recent surgery.  ACTIVITY:  You are encouraged to cough and deep breath or use your incentive spirometer if you were given one, every 15-30 minutes when awake.  This will help prevent respiratory complications and low grade fevers post-operatively if you had a general anesthetic.  You may want to hug a pillow when coughing and sneezing to add additional support to the surgical area, if you had abdominal or chest surgery, which will decrease pain during these times.  You are encouraged to walk and engage in light activity for the next two weeks.  You  should not lift more than 20 pounds, until 06/12/2017 as it could put you at increased risk for complications.  Twenty pounds is roughly equivalent to a plastic bag of groceries. At that time- Listen to your body when lifting, if you have pain when lifting, stop and then try again in a few days. Soreness after doing exercises or activities of daily living is normal as you get back in to your normal routine.  MEDICATIONS:  Try to take narcotic medications and anti-inflammatory medications, such as tylenol, ibuprofen, naprosyn, etc., with food.  This will minimize stomach upset from the medication.  Should you develop nausea and vomiting from the pain medication, or develop a rash, please discontinue the medication and contact your physician.  You should not drive, make important decisions, or operate machinery when taking narcotic pain medication.  SUNBLOCK Use sun block to incision area over the next year if this area will be exposed to sun. This helps decrease scarring and will allow you avoid a permanent darkened area over your incision.  QUESTIONS:  Please feel free to call our office if you have any questions, and we will be glad to assist you. [phone]    

## 2017-05-20 DIAGNOSIS — E538 Deficiency of other specified B group vitamins: Secondary | ICD-10-CM | POA: Diagnosis not present

## 2017-05-27 DIAGNOSIS — E538 Deficiency of other specified B group vitamins: Secondary | ICD-10-CM | POA: Diagnosis not present

## 2017-05-31 ENCOUNTER — Other Ambulatory Visit: Payer: Self-pay

## 2017-05-31 ENCOUNTER — Other Ambulatory Visit
Admission: RE | Admit: 2017-05-31 | Discharge: 2017-05-31 | Disposition: A | Discharge status: Home / Self Care | Payer: BLUE CROSS/BLUE SHIELD | Source: Ambulatory Visit | Attending: Gastroenterology | Admitting: Gastroenterology

## 2017-05-31 DIAGNOSIS — A048 Other specified bacterial intestinal infections: Secondary | ICD-10-CM

## 2017-06-03 ENCOUNTER — Encounter: Payer: Self-pay | Admitting: Surgery

## 2017-06-06 LAB — H. PYLORI BREATH TEST

## 2017-06-06 LAB — H.PYLORI BREATH TEST (REFLEX): H. pylori Breath Test: NEGATIVE

## 2017-06-25 DIAGNOSIS — E538 Deficiency of other specified B group vitamins: Secondary | ICD-10-CM | POA: Diagnosis not present

## 2017-07-01 ENCOUNTER — Other Ambulatory Visit: Payer: Self-pay | Admitting: Family Medicine

## 2017-07-01 DIAGNOSIS — R1319 Other dysphagia: Secondary | ICD-10-CM

## 2017-07-01 DIAGNOSIS — E119 Type 2 diabetes mellitus without complications: Secondary | ICD-10-CM

## 2017-07-01 DIAGNOSIS — R1310 Dysphagia, unspecified: Secondary | ICD-10-CM

## 2017-07-01 DIAGNOSIS — K219 Gastro-esophageal reflux disease without esophagitis: Secondary | ICD-10-CM

## 2017-07-03 ENCOUNTER — Encounter: Payer: Self-pay | Admitting: Family Medicine

## 2017-07-03 ENCOUNTER — Ambulatory Visit: Payer: BLUE CROSS/BLUE SHIELD | Admitting: Family Medicine

## 2017-07-03 ENCOUNTER — Ambulatory Visit
Admission: RE | Admit: 2017-07-03 | Discharge: 2017-07-03 | Disposition: A | Discharge status: Home / Self Care | Payer: BLUE CROSS/BLUE SHIELD | Source: Ambulatory Visit | Attending: Family Medicine | Admitting: Family Medicine

## 2017-07-03 VITALS — BP 120/74 | HR 84 | Ht 62.0 in | Wt 180.0 lb

## 2017-07-03 DIAGNOSIS — W19XXXA Unspecified fall, initial encounter: Secondary | ICD-10-CM

## 2017-07-03 DIAGNOSIS — I10 Essential (primary) hypertension: Secondary | ICD-10-CM | POA: Diagnosis not present

## 2017-07-03 DIAGNOSIS — S6991XA Unspecified injury of right wrist, hand and finger(s), initial encounter: Secondary | ICD-10-CM | POA: Insufficient documentation

## 2017-07-03 DIAGNOSIS — E669 Obesity, unspecified: Secondary | ICD-10-CM

## 2017-07-03 DIAGNOSIS — R1310 Dysphagia, unspecified: Secondary | ICD-10-CM | POA: Diagnosis not present

## 2017-07-03 DIAGNOSIS — K219 Gastro-esophageal reflux disease without esophagitis: Secondary | ICD-10-CM

## 2017-07-03 DIAGNOSIS — E785 Hyperlipidemia, unspecified: Secondary | ICD-10-CM

## 2017-07-03 DIAGNOSIS — M79644 Pain in right finger(s): Secondary | ICD-10-CM | POA: Diagnosis not present

## 2017-07-03 DIAGNOSIS — Z114 Encounter for screening for human immunodeficiency virus [HIV]: Secondary | ICD-10-CM | POA: Diagnosis not present

## 2017-07-03 DIAGNOSIS — Z1159 Encounter for screening for other viral diseases: Secondary | ICD-10-CM

## 2017-07-03 DIAGNOSIS — M7989 Other specified soft tissue disorders: Secondary | ICD-10-CM | POA: Diagnosis not present

## 2017-07-03 DIAGNOSIS — Z23 Encounter for immunization: Secondary | ICD-10-CM | POA: Diagnosis not present

## 2017-07-03 DIAGNOSIS — R1319 Other dysphagia: Secondary | ICD-10-CM

## 2017-07-03 DIAGNOSIS — E119 Type 2 diabetes mellitus without complications: Secondary | ICD-10-CM

## 2017-07-03 MED ORDER — TRIAMTERENE-HCTZ 37.5-25 MG PO TABS: 0.5000 | ORAL_TABLET | Freq: Every day | ORAL | 1 refills | Status: DC

## 2017-07-03 MED ORDER — AMLODIPINE BESYLATE 5 MG PO TABS: ORAL_TABLET | ORAL | 1 refills | Status: DC

## 2017-07-03 MED ORDER — PIOGLITAZONE HCL 30 MG PO TABS: ORAL_TABLET | ORAL | 1 refills | Status: DC

## 2017-07-03 MED ORDER — PANTOPRAZOLE SODIUM 40 MG PO TBEC: DELAYED_RELEASE_TABLET | ORAL | 1 refills | Status: DC

## 2017-07-03 MED ORDER — ATORVASTATIN CALCIUM 20 MG PO TABS: 20.0000 mg | ORAL_TABLET | Freq: Every day | ORAL | 1 refills | Status: DC

## 2017-07-03 MED ORDER — EMPAGLIFLOZIN 10 MG PO TABS: 10.0000 mg | ORAL_TABLET | Freq: Every day | ORAL | 1 refills | Status: DC

## 2017-07-03 MED ORDER — METFORMIN HCL 500 MG PO TABS: 500.0000 mg | ORAL_TABLET | Freq: Two times a day (BID) | ORAL | 1 refills | Status: DC

## 2017-07-03 NOTE — Progress Notes (Signed)
Name: Lisa Galvan   MRN: [id number]    DOB: 1960/09/16   Date:07/03/2017       Progress Note  Subjective  Chief Complaint  Chief Complaint  Patient presents with  . Hypertension  . Hyperlipidemia  . Gastroesophageal Reflux  . Diabetes  . thumb pain    fell 2 weeks ago, went to catch self with R) hand- thumb has been swollen and painful since the fall    Hypertension  This is a chronic problem. The current episode started more than 1 year ago. The problem is unchanged. The problem is controlled. Pertinent negatives include no anxiety, blurred vision, chest pain, headaches, malaise/fatigue, neck pain, orthopnea, palpitations, peripheral edema, PND, shortness of breath or sweats. There are no associated agents to hypertension. Past treatments include diuretics and calcium channel blockers. The current treatment provides moderate improvement. There are no compliance problems.  There is no history of angina, kidney disease, CAD/MI, heart failure, left ventricular hypertrophy, PVD or retinopathy. There is no history of chronic renal disease, a hypertension causing med or renovascular disease.  Hyperlipidemia  This is a chronic problem. The current episode started more than 1 year ago. The problem is controlled. Recent lipid tests were reviewed and are normal. Exacerbating diseases include obesity. She has no history of chronic renal disease, diabetes, hypothyroidism, liver disease or nephrotic syndrome. There are no known factors aggravating her hyperlipidemia. Pertinent negatives include no chest pain, focal sensory loss, focal weakness, leg pain, myalgias or shortness of breath. Current antihyperlipidemic treatment includes statins. The current treatment provides moderate improvement of lipids. There are no compliance problems.  Risk factors for coronary artery disease include diabetes mellitus, dyslipidemia and hypertension.  Gastroesophageal Reflux  She reports no abdominal pain, no belching, no  chest pain, no choking, no coughing, no dysphagia, no early satiety, no globus sensation, no heartburn, no hoarse voice, no nausea, no sore throat, no stridor, no tooth decay, no water brash or no wheezing. The current episode started in the past 7 days. The problem has been gradually improving. The symptoms are aggravated by certain foods. Pertinent negatives include no anemia, fatigue, melena, muscle weakness, orthopnea or weight loss. Risk factors include obesity. She has tried a PPI (protonix) for the symptoms. The treatment provided mild relief.  Diabetes  She presents for her follow-up diabetic visit. Her disease course has been stable. Pertinent negatives for hypoglycemia include no dizziness, headaches, nervousness/anxiousness or sweats. Associated symptoms include foot paresthesias. Pertinent negatives for diabetes include no blurred vision, no chest pain, no fatigue, no foot ulcerations, no polydipsia, no polyphagia, no polyuria, no visual change, no weakness and no weight loss. There are no hypoglycemic complications. Symptoms are stable. There are no diabetic complications. Pertinent negatives for diabetic complications include no PVD or retinopathy. Risk factors for coronary artery disease include dyslipidemia, diabetes mellitus, hypertension, post-menopausal and obesity. Current diabetic treatment includes oral agent (triple therapy). She is compliant with treatment all of the time. Her weight is increasing steadily. She is following a generally healthy diet. When asked about meal planning, she reported none. She participates in exercise intermittently. There is no change in her home blood glucose trend. Her breakfast blood glucose is taken between 8-9 am. Her breakfast blood glucose range is generally 110-130 mg/dl. An ACE inhibitor/angiotensin II receptor blocker is being taken. Eye exam is current.  Hand Injury   The incident occurred more than 1 week ago. Incident location: [place]. The injury  mechanism was a fall. The pain is present  in the right fingers (thumb). The quality of the pain is described as aching. Pertinent negatives include no chest pain or tingling. The symptoms are aggravated by movement. She has tried NSAIDs for the symptoms. The treatment provided mild relief.    No problem-specific Assessment & Plan notes found for this encounter.   Past Medical History:  Diagnosis Date  . Arthritis    spine  . Bilateral leg edema   . Chest pain 12/15/2014  . Chronic sciatica 12/10/2014  . Diabetes mellitus without complication (HCC)   . Essential hypertension 12/10/2014  . Essential hypertension 12/10/2014  . Gastroesophageal reflux disease without esophagitis   . GERD (gastroesophageal reflux disease)   . Hyperlipidemia   . Hypertension   . Mixed hyperlipidemia   . Obesity (BMI 30.0-34.9) 12/10/2014  . Type 2 diabetes mellitus without complication (HCC) 12/10/2014    Past Surgical History:  Procedure Laterality Date  . ABDOMINAL HYSTERECTOMY    . ESOPHAGOGASTRODUODENOSCOPY (EGD) WITH PROPOFOL N/A 03/12/2017   Procedure: ESOPHAGOGASTRODUODENOSCOPY (EGD) WITH PROPOFOL;  Surgeon: Toney ReilVanga, Rohini Reddy, MD;  [facility];  Service: Gastroenterology;  Laterality: N/A;  . UMBILICAL HERNIA REPAIR N/A 05/01/2017   Procedure: HERNIA REPAIR UMBILICAL ADULT;  Surgeon: Ancil Linseyavis, Jason Evan, MD;  Location: [facility] ORS;  Service: General;  Laterality: N/A;    Family History  Problem Relation Age of Onset  . Diabetes Mother   . Cancer Mother        breast and stomach  . Breast cancer Mother 168  . Heart disease Father   . Kidney disease Father        dialysis  . Cancer Father        colon    Social History   Socioeconomic History  . Marital status: Widowed    Spouse name: Not on file  . Number of children: Not on file  . Years of education: Not on file  . Highest education level: Not on file  Social Needs  . Financial resource strain: Not on file  . Food  insecurity - worry: Not on file  . Food insecurity - inability: Not on file  . Transportation needs - medical: Not on file  . Transportation needs - non-medical: Not on file  Occupational History  . Not on file  Tobacco Use  . Smoking status: Never Smoker  . Smokeless tobacco: Never Used  Substance and Sexual Activity  . Alcohol use: Yes    Alcohol/week: 1.2 oz    Types: 2 Glasses of wine per week    Comment: rare wine  . Drug use: No  . Sexual activity: Not on file  Other Topics Concern  . Not on file  Social History Narrative  . Not on file    Allergies  Allergen Reactions  . Amlodipine Besy-Benazepril Hcl Anaphylaxis  . Other Anaphylaxis  . Shellfish Allergy Swelling and Anaphylaxis  . Shellfish-Derived Products Anaphylaxis  . Ace Inhibitors Swelling    Outpatient Medications Prior to Visit  Medication Sig Dispense Refill  . Cholecalciferol (VITAMIN D-3) 5000 units TABS Take 5,000 Units by mouth daily.    Marland Kitchen. EPINEPHrine (EPIPEN 2-PAK) 0.3 mg/0.3 mL IJ SOAJ injection Inject 0.3 mLs (0.3 mg total) into the muscle once. Take for severe allergic reaction, then come immediately to the Emergency Department or call 911. 1 Device 0  . fexofenadine (ALLEGRA) 180 MG tablet Take 180 mg by mouth daily.    Marland Kitchen. gabapentin (NEURONTIN) 400 MG capsule Take 1 capsule by mouth 3 (three)  times daily. neuro    . naproxen sodium (ALEVE) 220 MG tablet Take 220-440 mg by mouth 2 (two) times daily as needed (for pain.).    Marland Kitchen valACYclovir (VALTREX) 500 MG tablet Take 500 mg by mouth 2 (two) times daily as needed (for outbreak).     Marland Kitchen amLODipine (NORVASC) 5 MG tablet TAKE 1 TABLET(5 MG) BY MOUTH DAILY (Patient taking differently: Take 5 mg by mouth at bedtime. ) 90 tablet 1  . atorvastatin (LIPITOR) 20 MG tablet Take 1 tablet (20 mg total) by mouth daily. (Patient taking differently: Take 20 mg by mouth every morning. ) 90 tablet 1  . empagliflozin (JARDIANCE) 10 MG TABS tablet Take 10 mg by mouth  daily. 90 tablet 1  . metFORMIN (GLUCOPHAGE) 500 MG tablet Take 1 tablet (500 mg total) by mouth 2 (two) times daily with a meal. 180 tablet 1  . pantoprazole (PROTONIX) 40 MG tablet TAKE 1 TABLET BY MOUTH TWICE DAILY 180 tablet 0  . pioglitazone (ACTOS) 30 MG tablet TAKE 1 TABLET(30 MG) BY MOUTH DAILY 90 tablet 0  . triamterene-hydrochlorothiazide (MAXZIDE-25) 37.5-25 MG tablet Take 0.5 tablets by mouth daily. 45 tablet 1  . gabapentin (NEURONTIN) 100 MG capsule Take 2 capsules (200 mg total) by mouth 3 (three) times daily. MUST GET REFILL BY NEUROLOGY 180 capsule 0  . gabapentin (NEURONTIN) 300 MG capsule Take by mouth.    Marland Kitchen omeprazole (PRILOSEC) 40 MG capsule Take 1 capsule (40 mg total) by mouth 2 (two) times daily. 60 capsule 0  . omeprazole (PRILOSEC) 40 MG capsule Take by mouth.     No facility-administered medications prior to visit.     Review of Systems  Constitutional: Negative for chills, fatigue, fever, malaise/fatigue and weight loss.  HENT: Negative for ear discharge, ear pain, hoarse voice and sore throat.   Eyes: Negative for blurred vision.  Respiratory: Negative for cough, sputum production, choking, shortness of breath and wheezing.   Cardiovascular: Negative for chest pain, palpitations, orthopnea, leg swelling and PND.  Gastrointestinal: Negative for abdominal pain, blood in stool, constipation, diarrhea, dysphagia, heartburn, melena and nausea.  Genitourinary: Negative for dysuria, frequency, hematuria and urgency.  Musculoskeletal: Positive for joint pain. Negative for back pain, myalgias, muscle weakness and neck pain.  Skin: Negative for rash.  Neurological: Negative for dizziness, tingling, sensory change, focal weakness, weakness and headaches.  Endo/Heme/Allergies: Negative for environmental allergies, polydipsia and polyphagia. Does not bruise/bleed easily.  Psychiatric/Behavioral: Negative for depression and suicidal ideas. The patient is not nervous/anxious  and does not have insomnia.      Objective  Vitals:   07/03/17 0905  BP: 120/74  Pulse: 84  Weight: 180 lb (81.6 kg)  Height: 5\' 2"  (1.575 m)    Physical Exam  Constitutional: She is well-developed, well-nourished, and in no distress. No distress.  HENT:  Head: Normocephalic and atraumatic.  Right Ear: External ear normal.  Left Ear: External ear normal.  Nose: Nose normal.  Mouth/Throat: Oropharynx is clear and moist.  Eyes: Conjunctivae and EOM are normal. Pupils are equal, round, and reactive to light. Right eye exhibits no discharge. Left eye exhibits no discharge.  Neck: Normal range of motion. Neck supple. No JVD present. No thyromegaly present.  Cardiovascular: Normal rate, regular rhythm, normal heart sounds and intact distal pulses. Exam reveals no gallop and no friction rub.  No murmur heard. Pulmonary/Chest: Effort normal and breath sounds normal. She has no wheezes. She has no rales.  Abdominal: Soft. Bowel sounds are  normal. She exhibits no mass. There is no tenderness. There is no guarding.  Musculoskeletal: Normal range of motion. She exhibits no edema.       Right hand: She exhibits tenderness.       Hands: Lymphadenopathy:    She has no cervical adenopathy.  Neurological: She is alert. She has normal reflexes.  Skin: Skin is warm and dry. She is not diaphoretic.  Psychiatric: Mood and affect normal.  Nursing note and vitals reviewed.     Assessment & Plan  Problem List Items Addressed This Visit      Endocrine   Type 2 diabetes mellitus without complication (HCC) - Primary   Relevant Medications   atorvastatin (LIPITOR) 20 MG tablet   empagliflozin (JARDIANCE) 10 MG TABS tablet   metFORMIN (GLUCOPHAGE) 500 MG tablet   pioglitazone (ACTOS) 30 MG tablet   Other Relevant Orders   Hepatic function panel   HgB A1c   Microalbumin, urine     Other   Obesity (BMI 30.0-34.9)   Hyperlipidemia   Relevant Medications   amLODipine (NORVASC) 5 MG tablet    atorvastatin (LIPITOR) 20 MG tablet   triamterene-hydrochlorothiazide (MAXZIDE-25) 37.5-25 MG tablet   Other Relevant Orders   Lipid Panel With LDL/HDL Ratio   Hepatic function panel    Other Visit Diagnoses    Essential hypertension       Relevant Medications   amLODipine (NORVASC) 5 MG tablet   atorvastatin (LIPITOR) 20 MG tablet   triamterene-hydrochlorothiazide (MAXZIDE-25) 37.5-25 MG tablet   Other Relevant Orders   Renal Function Panel   Hepatic function panel   Gastroesophageal reflux disease, esophagitis presence not specified       Relevant Medications   pantoprazole (PROTONIX) 40 MG tablet   Esophageal dysphagia       Relevant Medications   pantoprazole (PROTONIX) 40 MG tablet   Leg swelling       amlodipine vs venous insufficiency   Relevant Medications   triamterene-hydrochlorothiazide (MAXZIDE-25) 37.5-25 MG tablet   Encounter for screening for HIV       Relevant Orders   HIV antibody   Need for hepatitis C screening test       Relevant Orders   Hepatitis C antibody   Need for 23-polyvalent pneumococcal polysaccharide vaccine       Relevant Orders   Pneumococcal polysaccharide vaccine 23-valent greater than or equal to 2yo subcutaneous/IM (Completed)   Fall, initial encounter       NEW   Relevant Orders   DG Finger Thumb Right      Meds ordered this encounter  Medications  . amLODipine (NORVASC) 5 MG tablet    Sig: TAKE 1 TABLET(5 MG) BY MOUTH DAILY    Dispense:  90 tablet    Refill:  1  . atorvastatin (LIPITOR) 20 MG tablet    Sig: Take 1 tablet (20 mg total) by mouth daily.    Dispense:  90 tablet    Refill:  1  . empagliflozin (JARDIANCE) 10 MG TABS tablet    Sig: Take 10 mg by mouth daily.    Dispense:  90 tablet    Refill:  1  . metFORMIN (GLUCOPHAGE) 500 MG tablet    Sig: Take 1 tablet (500 mg total) by mouth 2 (two) times daily with a meal.    Dispense:  180 tablet    Refill:  1  . pantoprazole (PROTONIX) 40 MG tablet    Sig: TAKE 1  TABLET BY MOUTH TWICE DAILY  Dispense:  180 tablet    Refill:  1  . pioglitazone (ACTOS) 30 MG tablet    Sig: TAKE 1 TABLET(30 MG) BY MOUTH DAILY    Dispense:  90 tablet    Refill:  1  . triamterene-hydrochlorothiazide (MAXZIDE-25) 37.5-25 MG tablet    Sig: Take 0.5 tablets by mouth daily.    Dispense:  45 tablet    Refill:  1      Dr. Elizabeth Sauer [facility]  07/03/17

## 2017-07-04 ENCOUNTER — Other Ambulatory Visit: Payer: Self-pay

## 2017-07-04 LAB — LIPID PANEL WITH LDL/HDL RATIO
Cholesterol, Total: 202 mg/dL — ABNORMAL HIGH (ref 100–199)
HDL: 104 mg/dL (ref >39)
LDL Calculated: 78 mg/dL (ref 0–99)
LDl/HDL Ratio: 0.8 ratio (ref 0.0–3.2)
Triglycerides: 102 mg/dL (ref 0–149)
VLDL CHOLESTEROL CAL: 20 mg/dL (ref 5–40)

## 2017-07-04 LAB — HIV ANTIBODY (ROUTINE TESTING W REFLEX): HIV Screen 4th Generation wRfx: NONREACTIVE

## 2017-07-04 LAB — RENAL FUNCTION PANEL
ALBUMIN: 4.7 g/dL (ref 3.5–5.5)
BUN/Creatinine Ratio: 13 (ref 9–23)
BUN: 12 mg/dL (ref 6–24)
CHLORIDE: 98 mmol/L (ref 96–106)
CO2: 27 mmol/L (ref 20–29)
Calcium: 10.4 mg/dL — ABNORMAL HIGH (ref 8.7–10.2)
Creatinine, Ser: 0.92 mg/dL (ref 0.57–1.00)
GFR, EST AFRICAN AMERICAN: 79 mL/min/{1.73_m2} (ref >59)
GFR, EST NON AFRICAN AMERICAN: 69 mL/min/{1.73_m2} (ref >59)
Glucose: 111 mg/dL — ABNORMAL HIGH (ref 65–99)
PHOSPHORUS: 4.7 mg/dL — AB (ref 2.5–4.5)
Potassium: 3.9 mmol/L (ref 3.5–5.2)
Sodium: 141 mmol/L (ref 134–144)

## 2017-07-04 LAB — HEPATIC FUNCTION PANEL
ALT: 9 IU/L (ref 0–32)
AST: 13 IU/L (ref 0–40)
Alkaline Phosphatase: 55 IU/L (ref 39–117)
Bilirubin Total: 0.4 mg/dL (ref 0.0–1.2)
Bilirubin, Direct: 0.14 mg/dL (ref 0.00–0.40)
TOTAL PROTEIN: 6.9 g/dL (ref 6.0–8.5)

## 2017-07-04 LAB — HEMOGLOBIN A1C
Est. average glucose Bld gHb Est-mCnc: 137 mg/dL
Hgb A1c MFr Bld: 6.4 % — ABNORMAL HIGH (ref 4.8–5.6)

## 2017-07-04 LAB — MICROALBUMIN, URINE

## 2017-07-04 LAB — HEPATITIS C ANTIBODY: Hep C Virus Ab: <0.1 s/co ratio (ref 0.0–0.9)

## 2017-07-25 DIAGNOSIS — E782 Mixed hyperlipidemia: Secondary | ICD-10-CM | POA: Diagnosis not present

## 2017-07-25 DIAGNOSIS — I10 Essential (primary) hypertension: Secondary | ICD-10-CM | POA: Diagnosis not present

## 2017-07-25 DIAGNOSIS — K219 Gastro-esophageal reflux disease without esophagitis: Secondary | ICD-10-CM | POA: Diagnosis not present

## 2017-07-25 DIAGNOSIS — R600 Localized edema: Secondary | ICD-10-CM | POA: Diagnosis not present

## 2017-07-25 DIAGNOSIS — E538 Deficiency of other specified B group vitamins: Secondary | ICD-10-CM | POA: Diagnosis not present

## 2017-08-05 ENCOUNTER — Encounter: Payer: Self-pay | Admitting: Gastroenterology

## 2017-08-05 ENCOUNTER — Ambulatory Visit: Payer: BLUE CROSS/BLUE SHIELD | Admitting: Gastroenterology

## 2017-08-05 VITALS — BP 104/72 | HR 76 | Ht 62.0 in | Wt 183.2 lb

## 2017-08-05 DIAGNOSIS — K219 Gastro-esophageal reflux disease without esophagitis: Secondary | ICD-10-CM

## 2017-08-05 NOTE — Progress Notes (Signed)
Arlyss Repressohini R Mannie Ohlin, MD [address]  [address]  [place], [place] [address]  Main: [phone]  Fax: [phone]    Gastroenterology Consultation  Referring Provider:     Duanne LimerickJones, Deanna C, MD Primary Care Physician:  Duanne LimerickJones, Deanna C, MD Primary Gastroenterologist:  Dr. Arlyss Repressohini R Jajuan Skoog  Reason for Consultation:     GERD and H Pylori infection        HPI:   Maxcine HamSharon Akel is a 57 y.o. female referred for consultation & management  by Dr. Duanne LimerickJones, Deanna C, MD.  She has several years of heartburn, that has worsened over one year, describes it as pain in the chest radiating to right side as well as throat pain, worse laying flat. The pain is worse after eating or drinking something, denies any radiation to the back. She has previously tried Tums, Pepcid, she has been on Protonix for about 4 years was taking at 9 AM and 9 PM not associated with timing of food. She does not find protonix to be effective anymore. She was taking Nexium prior to protonix but changed when it became over-the-counter medication. She reports having sore taste in mouth, belching, feels like something is constantly moving down her throat. She had recent exacerbation and went to the emergency room last month. She has gained a few pounds in last few months. She does not drink sodas, no alcohol no smoking. She can tolerate tea, wine.  She goes to gym 3 times a week but she does consume high carb diet, likes to eat a lot of cheese.  Follow-up visit 05/13/2017: She underwent umbilical hernia repair and is doing well. Since last visit, patient underwent upper endoscopy which revealed H. pylori infection, treated with triple therapy. She denies having reflux symptoms on omeprazole 40 mg daily.  Follow up visit 08/05/17 H Pylori is confirmed eradicated based on H Pylori breath test. She is currently on protonix 40mg  BID for GERD and symptoms under control. She denies any symptoms today.   GI Procedures: [place], reportedly normal  EGD 03/12/17: - Normal duodenal bulb and second portion of the duodenum. - Normal stomach. Biopsied. - Normal gastroesophageal junction and esophagus. DIAGNOSIS:  A. STOMACH; RANDOM COLD BIOPSY:  - HELICOBACTER PYLORI-ASSOCIATED GASTRITIS, WITH MILD CHRONIC ACTIVE  INFLAMMATION.  - NEGATIVE FOR INTESTINAL METAPLASIA, ATROPHY, DYSPLASIA, AND  MALIGNANCY.  - H. PYLORI BACTERIA ARE SEEN IN HEMATOXYLIN AND EOSIN SECTIONS.  Denies family history of esophageal cancer, stomach cancer, colon cancer  Past Medical History:  Diagnosis Date  . Arthritis    spine  . Bilateral leg edema   . Chest pain 12/15/2014  . Chronic sciatica 12/10/2014  . Diabetes mellitus without complication (HCC)   . Essential hypertension 12/10/2014  . Essential hypertension 12/10/2014  . Gastroesophageal reflux disease without esophagitis   . GERD (gastroesophageal reflux disease)   . Hyperlipidemia   . Hypertension   . Mixed hyperlipidemia   . Obesity (BMI 30.0-34.9) 12/10/2014  . Type 2 diabetes mellitus without complication (HCC) 12/10/2014    Past Surgical History:  Procedure Laterality Date  . ABDOMINAL HYSTERECTOMY    . ESOPHAGOGASTRODUODENOSCOPY (EGD) WITH PROPOFOL N/A 03/12/2017   Procedure: ESOPHAGOGASTRODUODENOSCOPY (EGD) WITH PROPOFOL;  Surgeon: Toney ReilVanga, Akua Blethen Reddy, MD;  [facility];  Service: Gastroenterology;  Laterality: N/A;  . UMBILICAL HERNIA REPAIR N/A 05/01/2017   Procedure: HERNIA REPAIR UMBILICAL ADULT;  Surgeon: Ancil Linseyavis, Jason Evan, MD;  Location: [facility] ORS;  Service: General;  Laterality: N/A;  Current Outpatient Medications:  .  amLODipine (NORVASC) 5 MG tablet, TAKE 1 TABLET(5 MG) BY MOUTH DAILY, Disp: 90 tablet, Rfl: 1 .  atorvastatin (LIPITOR) 20 MG tablet, Take 1 tablet (20 mg total) by mouth daily., Disp: 90 tablet, Rfl: 1 .  Cholecalciferol (VITAMIN D-3) 5000 units TABS, Take 5,000 Units by mouth daily., Disp: , Rfl:  .   empagliflozin (JARDIANCE) 10 MG TABS tablet, Take 10 mg by mouth daily., Disp: 90 tablet, Rfl: 1 .  EPINEPHrine (EPIPEN 2-PAK) 0.3 mg/0.3 mL IJ SOAJ injection, Inject 0.3 mLs (0.3 mg total) into the muscle once. Take for severe allergic reaction, then come immediately to the Emergency Department or call 911., Disp: 1 Device, Rfl: 0 .  fexofenadine (ALLEGRA) 180 MG tablet, Take 180 mg by mouth daily., Disp: , Rfl:  .  gabapentin (NEURONTIN) 400 MG capsule, Take 1 capsule by mouth 3 (three) times daily. neuro, Disp: , Rfl:  .  metFORMIN (GLUCOPHAGE) 500 MG tablet, Take 1 tablet (500 mg total) by mouth 2 (two) times daily with a meal., Disp: 180 tablet, Rfl: 1 .  naproxen sodium (ALEVE) 220 MG tablet, Take 220-440 mg by mouth 2 (two) times daily as needed (for pain.)., Disp: , Rfl:  .  pantoprazole (PROTONIX) 40 MG tablet, TAKE 1 TABLET BY MOUTH TWICE DAILY, Disp: 180 tablet, Rfl: 1 .  pimecrolimus (ELIDEL) 1 % cream, Apply topically., Disp: , Rfl:  .  pioglitazone (ACTOS) 30 MG tablet, TAKE 1 TABLET(30 MG) BY MOUTH DAILY, Disp: 90 tablet, Rfl: 1 .  triamterene-hydrochlorothiazide (MAXZIDE-25) 37.5-25 MG tablet, Take 0.5 tablets by mouth daily., Disp: 45 tablet, Rfl: 1 .  valACYclovir (VALTREX) 500 MG tablet, Take 500 mg by mouth 2 (two) times daily as needed (for outbreak). , Disp: , Rfl:    Family History  Problem Relation Age of Onset  . Diabetes Mother   . Cancer Mother        breast and stomach  . Breast cancer Mother 79  . Heart disease Father   . Kidney disease Father        dialysis  . Cancer Father        colon     Social History   Tobacco Use  . Smoking status: Never Smoker  . Smokeless tobacco: Never Used  Substance Use Topics  . Alcohol use: Yes    Alcohol/week: 1.2 oz    Types: 2 Glasses of wine per week    Comment: rare wine  . Drug use: No    Allergies as of 08/05/2017 - Review Complete 08/05/2017  Allergen Reaction Noted  . Amlodipine besy-benazepril hcl  Anaphylaxis 07/04/2015  . Other Anaphylaxis 07/04/2015  . Shellfish allergy Swelling and Anaphylaxis 12/15/2014  . Shellfish-derived products Anaphylaxis 12/15/2014  . Ace inhibitors Swelling 06/05/2016    Review of Systems:    All systems reviewed and negative except where noted in HPI.   Physical Exam:  BP 104/72   Pulse 76   Ht 5\' 2"  (1.575 m)   Wt 183 lb 3.2 oz (83.1 kg)   BMI 33.51 kg/m  No LMP recorded. Patient has had a hysterectomy.  General:   Alert,  Well-developed, well-nourished, pleasant and cooperative in NAD Head:  Normocephalic and atraumatic. Eyes:  Sclera clear, no icterus.   Conjunctiva pink. Ears:  Normal auditory acuity. Nose:  No deformity, discharge, or lesions. Mouth:  No deformity or lesions,oropharynx pink & moist. Neck:  Supple; no masses or thyromegaly. Lungs:  Respirations even and  unlabored.  Clear throughout to auscultation.   No wheezes, crackles, or rhonchi. No acute distress. Heart:  Regular rate and rhythm; no murmurs, clicks, rubs, or gallops. Abdomen:  Normal bowel sounds.  No bruits.  Soft, non-tender and non-distended without masses, hepatosplenomegaly or hernias noted.  No guarding or rebound tenderness.    Msk:  Symmetrical without gross deformities. Good, equal movement & strength bilaterally. Pulses:  Normal pulses noted. Extremities:  No clubbing or edema.  No cyanosis. Neurologic:  Alert and oriented x3;  grossly normal neurologically. Skin:  Intact without significant lesions or rashes. No jaundice. Lymph Nodes:  No significant cervical adenopathy. Psych:  Alert and cooperative. Normal mood and affect.  Imaging Studies: No results found.  Assessment and Plan:   Lilliam Chamblee is a 57 y.o. black  female with chronic GERD without esophagitis and H Pylori infection, Status post-triple therapy with confirmed eradication by breath test here for follow-up.  GERD: Symptoms are well controlled on daily PPI -EGD normal -Continue  antireflux measures and healthy lifestyle -Recommend decrease protonix to daily and eventually switch to H2 blockers  Colon cancer screening: Last colonoscopy possibly in 2013 in [place] per pt. She will try to obtain records in next 1-2 weeks as she is going to [place]. If we cannot get records, I recommended colonoscopy in next 1-27months. She will contact my office   Follow up as needed   Arlyss Repress, MD

## 2017-08-26 DIAGNOSIS — E538 Deficiency of other specified B group vitamins: Secondary | ICD-10-CM | POA: Diagnosis not present

## 2017-08-27 DIAGNOSIS — E119 Type 2 diabetes mellitus without complications: Secondary | ICD-10-CM | POA: Diagnosis not present

## 2017-08-27 DIAGNOSIS — E538 Deficiency of other specified B group vitamins: Secondary | ICD-10-CM | POA: Diagnosis not present

## 2017-08-27 DIAGNOSIS — R202 Paresthesia of skin: Secondary | ICD-10-CM | POA: Diagnosis not present

## 2018-01-15 ENCOUNTER — Ambulatory Visit: Payer: BLUE CROSS/BLUE SHIELD | Admitting: Family Medicine

## 2018-02-11 ENCOUNTER — Ambulatory Visit: Payer: BLUE CROSS/BLUE SHIELD | Admitting: Family Medicine

## 2018-02-27 DIAGNOSIS — E559 Vitamin D deficiency, unspecified: Secondary | ICD-10-CM | POA: Diagnosis not present

## 2018-02-27 DIAGNOSIS — M25449 Effusion, unspecified hand: Secondary | ICD-10-CM | POA: Diagnosis not present

## 2018-02-27 DIAGNOSIS — Z79899 Other long term (current) drug therapy: Secondary | ICD-10-CM | POA: Diagnosis not present

## 2018-02-27 DIAGNOSIS — R296 Repeated falls: Secondary | ICD-10-CM | POA: Diagnosis not present

## 2018-02-27 LAB — POCT ERYTHROCYTE SEDIMENTATION RATE, NON-AUTOMATED: SED RATE: 10

## 2018-02-27 LAB — HEMOGLOBIN A1C: HEMOGLOBIN A1C: 7.2 — AB (ref 4.0–6.0)

## 2018-02-27 LAB — CBC AND DIFFERENTIAL
HEMATOCRIT: 42 (ref 36–46)
Hemoglobin: 13.9 (ref 12.0–16.0)
Platelets: 193 (ref 150–399)
WBC: 4.2

## 2018-02-27 LAB — BASIC METABOLIC PANEL
BUN: 13 (ref 4–21)
CREATININE: 0.7 (ref 0.5–1.1)
Glucose: 137
Potassium: 3.6 (ref 3.4–5.3)
Sodium: 143 (ref 137–147)

## 2018-02-27 LAB — HEPATIC FUNCTION PANEL
ALT: 15 (ref 7–35)
AST: 15 (ref 13–35)
Alkaline Phosphatase: 51 (ref 25–125)
Bilirubin, Total: 0.6

## 2018-02-27 LAB — TSH: TSH: 1.06 (ref 0.41–5.90)

## 2018-02-27 LAB — VITAMIN D 25 HYDROXY (VIT D DEFICIENCY, FRACTURES): VIT D 25 HYDROXY: 59.1

## 2018-03-03 ENCOUNTER — Other Ambulatory Visit: Payer: Self-pay | Admitting: Neurology

## 2018-03-03 DIAGNOSIS — R296 Repeated falls: Secondary | ICD-10-CM

## 2018-03-07 DIAGNOSIS — M79644 Pain in right finger(s): Secondary | ICD-10-CM | POA: Diagnosis not present

## 2018-03-07 DIAGNOSIS — M65311 Trigger thumb, right thumb: Secondary | ICD-10-CM | POA: Diagnosis not present

## 2018-03-07 DIAGNOSIS — M1811 Unilateral primary osteoarthritis of first carpometacarpal joint, right hand: Secondary | ICD-10-CM | POA: Diagnosis not present

## 2018-03-07 LAB — HM DIABETES EYE EXAM

## 2018-03-10 ENCOUNTER — Ambulatory Visit
Admission: RE | Admit: 2018-03-10 | Discharge: 2018-03-10 | Disposition: A | Discharge status: Home / Self Care | Payer: BLUE CROSS/BLUE SHIELD | Source: Ambulatory Visit | Attending: Neurology | Admitting: Neurology

## 2018-03-10 DIAGNOSIS — Z79899 Other long term (current) drug therapy: Secondary | ICD-10-CM | POA: Insufficient documentation

## 2018-03-10 DIAGNOSIS — R296 Repeated falls: Secondary | ICD-10-CM

## 2018-03-10 DIAGNOSIS — R51 Headache: Secondary | ICD-10-CM | POA: Diagnosis not present

## 2018-03-10 DIAGNOSIS — H0520 Unspecified exophthalmos: Secondary | ICD-10-CM | POA: Insufficient documentation

## 2018-03-11 ENCOUNTER — Encounter: Payer: Self-pay | Admitting: Family Medicine

## 2018-03-11 ENCOUNTER — Ambulatory Visit: Payer: BLUE CROSS/BLUE SHIELD | Admitting: Family Medicine

## 2018-03-11 ENCOUNTER — Other Ambulatory Visit: Payer: Self-pay

## 2018-03-11 VITALS — BP 111/73 | HR 82 | Temp 98.5°F | Ht 63.2 in | Wt 195.1 lb

## 2018-03-11 DIAGNOSIS — E1140 Type 2 diabetes mellitus with diabetic neuropathy, unspecified: Secondary | ICD-10-CM

## 2018-03-11 DIAGNOSIS — E119 Type 2 diabetes mellitus without complications: Secondary | ICD-10-CM | POA: Diagnosis not present

## 2018-03-11 DIAGNOSIS — E559 Vitamin D deficiency, unspecified: Secondary | ICD-10-CM | POA: Diagnosis not present

## 2018-03-11 DIAGNOSIS — E785 Hyperlipidemia, unspecified: Secondary | ICD-10-CM

## 2018-03-11 DIAGNOSIS — R1319 Other dysphagia: Secondary | ICD-10-CM

## 2018-03-11 DIAGNOSIS — K219 Gastro-esophageal reflux disease without esophagitis: Secondary | ICD-10-CM

## 2018-03-11 DIAGNOSIS — E1340 Other specified diabetes mellitus with diabetic neuropathy, unspecified: Secondary | ICD-10-CM

## 2018-03-11 DIAGNOSIS — I10 Essential (primary) hypertension: Secondary | ICD-10-CM

## 2018-03-11 DIAGNOSIS — Z79899 Other long term (current) drug therapy: Secondary | ICD-10-CM | POA: Diagnosis not present

## 2018-03-11 DIAGNOSIS — R1310 Dysphagia, unspecified: Secondary | ICD-10-CM

## 2018-03-11 DIAGNOSIS — E782 Mixed hyperlipidemia: Secondary | ICD-10-CM

## 2018-03-11 LAB — MICROALBUMIN, URINE WAIVED
Creatinine, Urine Waived: 50 mg/dL (ref 10–300)
MICROALB, UR WAIVED: 10 mg/L (ref 0–19)
Microalb/Creat Ratio: <30 mg/g (ref <30)

## 2018-03-11 MED ORDER — IBUPROFEN 800 MG PO TABS: 800.0000 mg | ORAL_TABLET | Freq: Three times a day (TID) | ORAL | 3 refills | Status: DC | PRN

## 2018-03-11 MED ORDER — METFORMIN HCL 500 MG PO TABS: 500.0000 mg | ORAL_TABLET | Freq: Two times a day (BID) | ORAL | 1 refills | Status: DC

## 2018-03-11 MED ORDER — EMPAGLIFLOZIN 10 MG PO TABS: 10.0000 mg | ORAL_TABLET | Freq: Every day | ORAL | 1 refills | Status: DC

## 2018-03-11 MED ORDER — ATORVASTATIN CALCIUM 20 MG PO TABS: 20.0000 mg | ORAL_TABLET | Freq: Every day | ORAL | 1 refills | Status: DC

## 2018-03-11 MED ORDER — PIOGLITAZONE HCL 30 MG PO TABS: ORAL_TABLET | ORAL | 1 refills | Status: DC

## 2018-03-11 MED ORDER — PANTOPRAZOLE SODIUM 40 MG PO TBEC: DELAYED_RELEASE_TABLET | ORAL | 1 refills | Status: DC

## 2018-03-11 MED ORDER — AMLODIPINE BESYLATE 5 MG PO TABS: ORAL_TABLET | ORAL | 1 refills | Status: DC

## 2018-03-11 NOTE — Assessment & Plan Note (Signed)
Under good control on current regimen. Continue current regimen. Continue to monitor. Call with any concerns. Refills given. CBC checked last week and normal.

## 2018-03-11 NOTE — Assessment & Plan Note (Signed)
Rechecking levels today. Await results. Call with any concerns. Tolerating medicine well. Refills given today.

## 2018-03-11 NOTE — Assessment & Plan Note (Signed)
Not under great control. Had bad reaction to gabapentin. Currently on cymbalta- being increased by neurology. Continue to increase cymbalta, if not getting better, consider starting lyrica.

## 2018-03-11 NOTE — Progress Notes (Signed)
BP 111/73   Pulse 82   Temp 98.5 F (36.9 C) (Oral)   Ht 5' 3.2" (1.605 m)   Wt 195 lb 1.6 oz (88.5 kg)   SpO2 99%   BMI 34.34 kg/m    Subjective:    Patient ID: Lisa Galvan, female    DOB: 17-Jul-1960, 57 y.o.   MRN: [id number]  HPI: Story Conti is a 57 y.o. female who presents today to establish care. Had been seeing PCP in [place] until March- has been following with [facility] with neurology and orthopedics  Chief Complaint  Patient presents with  . Establish Care  . Hypertension  . Hyperlipidemia  . Diabetes   Known neuropathy- had been on gabapentin which was causing side effects. Now on cymbalta and feeling well on that. Has been feeling OK with her neuropathy.  DIABETES Hypoglycemic episodes:no Polydipsia/polyuria: no Visual disturbance: no Chest pain: no Paresthesias: yes Glucose Monitoring: yes  Accucheck frequency: Daily  Fasting glucose: 90s-100s Taking Insulin?: no Blood Pressure Monitoring: not checking Retinal Examination: Up to Date Foot Exam: Done today Diabetic Education: Completed Pneumovax: Up to Date Influenza: Up to Date Aspirin: no  HYPERTENSION / HYPERLIPIDEMIA Satisfied with current treatment? yes Duration of hypertension: chronic BP monitoring frequency: not checking BP medication side effects: no Past BP meds: amlodipine Duration of hyperlipidemia: chronic Cholesterol medication side effects: no Cholesterol supplements: none Past cholesterol medications: atorvastatin, triamterene-hctz Medication compliance: excellent compliance Aspirin: no Recent stressors: no Recurrent headaches: no Visual changes: no Palpitations: no Dyspnea: no Chest pain: no Lower extremity edema: no Dizzy/lightheaded: no  Active Ambulatory Problems    Diagnosis Date Noted  . Obesity (BMI 30.0-34.9) 12/10/2014  . Chronic sciatica 12/10/2014  . Type 2 diabetes mellitus with diabetic neuropathy, unspecified (HCC) 12/10/2014  . Hyperlipidemia  12/10/2014  . Gastroesophageal reflux disease without esophagitis 12/10/2014  . Vitamin D deficiency 12/13/2014  . DDD (degenerative disc disease), lumbar 12/16/2014  . Facet syndrome, lumbar 12/16/2014  . Sacroiliac joint dysfunction 12/16/2014  . Neuropathy due to secondary diabetes (HCC) 12/16/2014  . Bilateral leg edema 07/02/2016  . Mastalgia in female 04/09/2017  . Helicobacter pylori infection 05/13/2017   Resolved Ambulatory Problems    Diagnosis Date Noted  . Microalbuminuria 12/13/2014  . Chest pain 12/15/2014  . Mixed hyperlipidemia 12/15/2014  . Umbilical hernia without obstruction or gangrene    Past Medical History:  Diagnosis Date  . Allergy   . Arthritis   . Diabetes mellitus without complication (HCC)   . Essential hypertension 12/10/2014  . Essential hypertension 12/10/2014  . GERD (gastroesophageal reflux disease)   . Hypertension   . Type 2 diabetes mellitus without complication (HCC) 12/10/2014   Past Surgical History:  Procedure Laterality Date  . ABDOMINAL HYSTERECTOMY    . ESOPHAGOGASTRODUODENOSCOPY (EGD) WITH PROPOFOL N/A 03/12/2017   Procedure: ESOPHAGOGASTRODUODENOSCOPY (EGD) WITH PROPOFOL;  Surgeon: Toney Reil, MD;  Location: [facility] ENDOSCOPY;  Service: Gastroenterology;  Laterality: N/A;  . HERNIA REPAIR    . UMBILICAL HERNIA REPAIR N/A 05/01/2017   Procedure: HERNIA REPAIR UMBILICAL ADULT;  Surgeon: Ancil Linsey, MD;  Location: [facility] ORS;  Service: General;  Laterality: N/A;   Outpatient Encounter Medications as of 03/11/2018  Medication Sig  . amLODipine (NORVASC) 5 MG tablet TAKE 1 TABLET(5 MG) BY MOUTH DAILY  . atorvastatin (LIPITOR) 20 MG tablet Take 1 tablet (20 mg total) by mouth daily.  . Cholecalciferol (VITAMIN D-3) 5000 units TABS Take 5,000 Units by mouth daily.  . DULoxetine (CYMBALTA)  20 MG capsule Take by mouth.  . empagliflozin (JARDIANCE) 10 MG TABS tablet Take 10 mg by mouth daily.  Marland Kitchen. EPINEPHrine (EPIPEN 2-PAK) 0.3  mg/0.3 mL IJ SOAJ injection Inject 0.3 mLs (0.3 mg total) into the muscle once. Take for severe allergic reaction, then come immediately to the Emergency Department or call 911.  . fexofenadine (ALLEGRA) 180 MG tablet Take 180 mg by mouth daily.  . metFORMIN (GLUCOPHAGE) 500 MG tablet Take 1 tablet (500 mg total) by mouth 2 (two) times daily with a meal.  . pantoprazole (PROTONIX) 40 MG tablet TAKE 1 TABLET BY MOUTH TWICE DAILY  . pioglitazone (ACTOS) 30 MG tablet TAKE 1 TABLET(30 MG) BY MOUTH DAILY  . valACYclovir (VALTREX) 500 MG tablet Take 500 mg by mouth 2 (two) times daily as needed (for outbreak).   . [DISCONTINUED] amLODipine (NORVASC) 5 MG tablet TAKE 1 TABLET(5 MG) BY MOUTH DAILY  . [DISCONTINUED] atorvastatin (LIPITOR) 20 MG tablet Take 1 tablet (20 mg total) by mouth daily.  . [DISCONTINUED] empagliflozin (JARDIANCE) 10 MG TABS tablet Take 10 mg by mouth daily.  . [DISCONTINUED] metFORMIN (GLUCOPHAGE) 500 MG tablet Take 1 tablet (500 mg total) by mouth 2 (two) times daily with a meal.  . [DISCONTINUED] pantoprazole (PROTONIX) 40 MG tablet TAKE 1 TABLET BY MOUTH TWICE DAILY  . [DISCONTINUED] pioglitazone (ACTOS) 30 MG tablet TAKE 1 TABLET(30 MG) BY MOUTH DAILY  . [DISCONTINUED] triamterene-hydrochlorothiazide (MAXZIDE-25) 37.5-25 MG tablet Take 0.5 tablets by mouth daily.  Marland Kitchen. ibuprofen (ADVIL,MOTRIN) 800 MG tablet Take 1 tablet (800 mg total) by mouth every 8 (eight) hours as needed.  . [DISCONTINUED] gabapentin (NEURONTIN) 400 MG capsule Take 1 capsule by mouth 3 (three) times daily. neuro  . [DISCONTINUED] naproxen sodium (ALEVE) 220 MG tablet Take 220-440 mg by mouth 2 (two) times daily as needed (for pain.).  . [DISCONTINUED] pimecrolimus (ELIDEL) 1 % cream Apply topically.   No facility-administered encounter medications on file as of 03/11/2018.    Allergies  Allergen Reactions  . Amlodipine Besy-Benazepril Hcl Anaphylaxis  . Other Anaphylaxis  . Shellfish Allergy Swelling and  Anaphylaxis  . Shellfish-Derived Products Anaphylaxis  . Ace Inhibitors Swelling    Social History   Socioeconomic History  . Marital status: Widowed    Spouse name: Not on file  . Number of children: Not on file  . Years of education: Not on file  . Highest education level: Not on file  Occupational History  . Not on file  Social Needs  . Financial resource strain: Not on file  . Food insecurity:    Worry: Not on file    Inability: Not on file  . Transportation needs:    Medical: Not on file    Non-medical: Not on file  Tobacco Use  . Smoking status: Never Smoker  . Smokeless tobacco: Never Used  Substance and Sexual Activity  . Alcohol use: Yes    Alcohol/week: 2.0 standard drinks    Types: 2 Glasses of wine per week    Comment: rare wine  . Drug use: No  . Sexual activity: Not Currently  Lifestyle  . Physical activity:    Days per week: Not on file    Minutes per session: Not on file  . Stress: Not on file  Relationships  . Social connections:    Talks on phone: Not on file    Gets together: Not on file    Attends religious service: Not on file    Active member of  club or organization: Not on file    Attends meetings of clubs or organizations: Not on file    Relationship status: Not on file  Other Topics Concern  . Not on file  Social History Narrative  . Not on file   Family History  Problem Relation Age of Onset  . Diabetes Mother   . Cancer Mother        breast and stomach  . Breast cancer Mother 40  . Heart disease Father   . Kidney disease Father        dialysis  . Cancer Father        colon  . Diabetes Father   . Hypertension Sister   . Hypertension Brother   . Congestive Heart Failure Maternal Grandmother   . Diabetes Maternal Grandmother   . Hypertension Maternal Grandmother    GAD 7 : Generalized Anxiety Score 03/11/2018  Nervous, Anxious, on Edge 0  Control/stop worrying 0  Worry too much - different things 0  Trouble relaxing 0    Restless 0  Easily annoyed or irritable 0  Afraid - awful might happen 0  Total GAD 7 Score 0  Anxiety Difficulty Not difficult at all    Depression screen [facility] 2/9 03/11/2018 07/03/2017 07/03/2017 01/03/2017 12/29/2014  Decreased Interest 0 0 0 0 0  Down, Depressed, Hopeless 0 0 0 0 0  PHQ - 2 Score 0 0 0 0 0  Altered sleeping 0 0 - 0 -  Tired, decreased energy 0 0 - 0 -  Change in appetite 0 0 - 0 -  Feeling bad or failure about yourself  0 0 - 0 -  Trouble concentrating 0 0 - 0 -  Moving slowly or fidgety/restless 0 0 - 0 -  Suicidal thoughts 0 0 - 0 -  PHQ-9 Score 0 0 - 0 -  Difficult doing work/chores Not difficult at all - - - -   Fall Risk  03/11/2018 07/03/2017 12/29/2014 12/16/2014  Falls in the past year? 1 Yes No No  Number falls in past yr: 1 1 - -  Injury with Fall? 1 Yes - -  Risk for fall due to : Impaired balance/gait;History of fall(s);Medication side effect - - -  Follow up Education provided - - -   Functional Status Survey: Is the patient deaf or have difficulty hearing?: No Does the patient have difficulty seeing, even when wearing glasses/contacts?: No Does the patient have difficulty concentrating, remembering, or making decisions?: No Does the patient have difficulty walking or climbing stairs?: Yes Does the patient have difficulty dressing or bathing?: No Does the patient have difficulty doing errands alone such as visiting a doctor's office or shopping?: No   Review of Systems  Constitutional: Negative.   HENT: Negative.   Respiratory: Negative.   Cardiovascular: Negative.   Musculoskeletal: Negative.   Neurological: Positive for numbness. Negative for dizziness, tremors, seizures, syncope, facial asymmetry, speech difficulty, weakness, light-headedness and headaches.  Psychiatric/Behavioral: Negative.     Per HPI unless specifically indicated above     Objective:    BP 111/73   Pulse 82   Temp 98.5 F (36.9 C) (Oral)   Ht 5' 3.2" (1.605 m)    Wt 195 lb 1.6 oz (88.5 kg)   SpO2 99%   BMI 34.34 kg/m   Wt Readings from Last 3 Encounters:  03/11/18 195 lb 1.6 oz (88.5 kg)  08/05/17 183 lb 3.2 oz (83.1 kg)  07/03/17 180 lb (81.6 kg)  Physical Exam  Constitutional: She is oriented to person, place, and time. She appears well-developed and well-nourished. No distress.  HENT:  Head: Normocephalic and atraumatic.  Right Ear: Hearing normal.  Left Ear: Hearing normal.  Nose: Nose normal.  Eyes: Conjunctivae and lids are normal. Right eye exhibits no discharge. Left eye exhibits no discharge. No scleral icterus.  Cardiovascular: Normal rate, regular rhythm, normal heart sounds and intact distal pulses. Exam reveals no gallop and no friction rub.  No murmur heard. Pulmonary/Chest: Effort normal and breath sounds normal. No stridor. No respiratory distress. She has no wheezes. She has no rales. She exhibits no tenderness.  Musculoskeletal: Normal range of motion.  Neurological: She is alert and oriented to person, place, and time.  Skin: Skin is warm, dry and intact. Capillary refill takes less than 2 seconds. No rash noted. She is not diaphoretic. No erythema. No pallor.  Psychiatric: She has a normal mood and affect. Her speech is normal and behavior is normal. Judgment and thought content normal. Cognition and memory are normal.  Vitals reviewed.  Diabetic Foot Exam - Simple   Simple Foot Form Diabetic Foot exam was performed with the following findings:  Yes 03/11/2018 12:27 PM  Visual Inspection No deformities, no ulcerations, no other skin breakdown bilaterally:  Yes Sensation Testing Intact to touch and monofilament testing bilaterally:  Yes Pulse Check Posterior Tibialis and Dorsalis pulse intact bilaterally:  Yes Comments      Results for orders placed or performed in visit on 03/11/18  CBC and differential  Result Value Ref Range   Hemoglobin 13.9 12.0 - 16.0   HCT 42 36 - 46   Platelets 193 150 - 399   WBC 4.2    VITAMIN D 25 Hydroxy (Vit-D Deficiency, Fractures)  Result Value Ref Range   Vit D, 25-Hydroxy 59.1   Basic metabolic panel  Result Value Ref Range   Glucose 137    BUN 13 4 - 21   Creatinine 0.7 0.5 - 1.1   Potassium 3.6 3.4 - 5.3   Sodium 143 137 - 147  Hepatic function panel  Result Value Ref Range   Alkaline Phosphatase 51 25 - 125   ALT 15 7 - 35   AST 15 13 - 35   Bilirubin, Total 0.6   Hemoglobin A1c  Result Value Ref Range   Hgb A1c MFr Bld 7.2 (A) 4.0 - 6.0  TSH  Result Value Ref Range   TSH 1.06 0.41 - 5.90  POCT erythrocyte sed rate, Non-automated  Result Value Ref Range   Sed Rate 10       Assessment & Plan:   Problem List Items Addressed This Visit      Digestive   Gastroesophageal reflux disease without esophagitis    Under good control on current regimen. Continue current regimen. Continue to monitor. Call with any concerns. Refills given. CBC checked last week and normal.        Relevant Medications   pantoprazole (PROTONIX) 40 MG tablet     Endocrine   Type 2 diabetes mellitus with diabetic neuropathy, unspecified (HCC) - Primary    Going in the wrong direction with A1c of 7.2- will work on diet and recheck in 3 months. Call with any concerns.       Relevant Medications   pioglitazone (ACTOS) 30 MG tablet   metFORMIN (GLUCOPHAGE) 500 MG tablet   empagliflozin (JARDIANCE) 10 MG TABS tablet   atorvastatin (LIPITOR) 20 MG tablet   Neuropathy  due to secondary diabetes [facility])    Not under great control. Had bad reaction to gabapentin. Currently on cymbalta- being increased by neurology. Continue to increase cymbalta, if not getting better, consider starting lyrica.      Relevant Medications   pioglitazone (ACTOS) 30 MG tablet   metFORMIN (GLUCOPHAGE) 500 MG tablet   empagliflozin (JARDIANCE) 10 MG TABS tablet   atorvastatin (LIPITOR) 20 MG tablet     Other   Hyperlipidemia    Rechecking levels today. Await results. Call with any concerns.  Tolerating medicine well. Refills given today.      Relevant Medications   atorvastatin (LIPITOR) 20 MG tablet   amLODipine (NORVASC) 5 MG tablet   Other Relevant Orders   Lipid Panel w/o Chol/HDL Ratio   Vitamin D deficiency    Normal on last check last week. Continue to monitor. Call with any concerns.       RESOLVED: Mixed hyperlipidemia   Relevant Medications   atorvastatin (LIPITOR) 20 MG tablet   amLODipine (NORVASC) 5 MG tablet    Other Visit Diagnoses    Gastroesophageal reflux disease, esophagitis presence not specified       Relevant Medications   pantoprazole (PROTONIX) 40 MG tablet   Esophageal dysphagia       Relevant Medications   pantoprazole (PROTONIX) 40 MG tablet   Essential hypertension       Relevant Medications   atorvastatin (LIPITOR) 20 MG tablet   amLODipine (NORVASC) 5 MG tablet       Follow up plan: Return in about 3 months (around 06/11/2018) for DM follow up.

## 2018-03-11 NOTE — Assessment & Plan Note (Signed)
Normal on last check last week. Continue to monitor. Call with any concerns.

## 2018-03-11 NOTE — Assessment & Plan Note (Signed)
Going in the wrong direction with A1c of 7.2- will work on diet and recheck in 3 months. Call with any concerns.

## 2018-03-12 LAB — LIPID PANEL W/O CHOL/HDL RATIO
CHOLESTEROL TOTAL: 187 mg/dL (ref 100–199)
HDL: 100 mg/dL (ref >39)
LDL Calculated: 66 mg/dL (ref 0–99)
Triglycerides: 103 mg/dL (ref 0–149)
VLDL Cholesterol Cal: 21 mg/dL (ref 5–40)

## 2018-04-04 DIAGNOSIS — M65311 Trigger thumb, right thumb: Secondary | ICD-10-CM | POA: Diagnosis not present

## 2018-04-04 DIAGNOSIS — M1811 Unilateral primary osteoarthritis of first carpometacarpal joint, right hand: Secondary | ICD-10-CM | POA: Diagnosis not present

## 2018-04-04 DIAGNOSIS — M79644 Pain in right finger(s): Secondary | ICD-10-CM | POA: Diagnosis not present

## 2018-06-11 ENCOUNTER — Ambulatory Visit: Payer: BLUE CROSS/BLUE SHIELD | Admitting: Family Medicine

## 2018-06-16 ENCOUNTER — Encounter: Payer: Self-pay | Admitting: Family Medicine

## 2018-06-16 ENCOUNTER — Ambulatory Visit: Payer: BLUE CROSS/BLUE SHIELD | Admitting: Family Medicine

## 2018-06-16 VITALS — BP 126/82 | HR 78 | Temp 97.9°F | Wt 193.0 lb

## 2018-06-16 DIAGNOSIS — M79644 Pain in right finger(s): Secondary | ICD-10-CM

## 2018-06-16 DIAGNOSIS — G8929 Other chronic pain: Secondary | ICD-10-CM | POA: Diagnosis not present

## 2018-06-16 DIAGNOSIS — R1011 Right upper quadrant pain: Secondary | ICD-10-CM | POA: Diagnosis not present

## 2018-06-16 DIAGNOSIS — E1140 Type 2 diabetes mellitus with diabetic neuropathy, unspecified: Secondary | ICD-10-CM | POA: Diagnosis not present

## 2018-06-16 LAB — BAYER DCA HB A1C WAIVED: HB A1C (BAYER DCA - WAIVED): 6.4 % (ref <7.0)

## 2018-06-16 MED ORDER — DULOXETINE HCL 20 MG PO CPEP: 20.0000 mg | ORAL_CAPSULE | Freq: Every day | ORAL | 0 refills | Status: DC

## 2018-06-16 MED ORDER — KETOCONAZOLE 2 % EX CREA: 1.0000 "application " | TOPICAL_CREAM | Freq: Every day | CUTANEOUS | 1 refills | Status: DC

## 2018-06-16 NOTE — Assessment & Plan Note (Signed)
Doing well with A1c of 6.4 down from 7.2. Continue current regimen. Continue to monitor. Call with any concerns. Recheck 3 months.

## 2018-06-16 NOTE — Progress Notes (Signed)
BP 126/82   Pulse 78   Temp 97.9 F (36.6 C) (Oral)   Wt 193 lb (87.5 kg)   SpO2 96%   BMI 33.97 kg/m    Subjective:    Patient ID: Lisa Galvan, female    DOB: [birth year], 58 y.o.   MRN: [id number]  HPI: Lisa Galvan is a 58 y.o. female  Chief Complaint  Patient presents with  . Diabetes   DIABETES Hypoglycemic episodes:no Polydipsia/polyuria: no Visual disturbance: no Chest pain: no Paresthesias: no Glucose Monitoring: yes  Accucheck frequency: Daily Taking Insulin?: no Blood Pressure Monitoring: not checking Retinal Examination: Up to Date Foot Exam: Up to Date Diabetic Education: Completed Pneumovax: Up to Date Influenza: Up to Date Aspirin: no  Relevant past medical, surgical, family and social history reviewed and updated as indicated. Interim medical history since our last visit reviewed. Allergies and medications reviewed and updated.  Review of Systems  Constitutional: Negative.   Respiratory: Negative.   Cardiovascular: Negative.   Gastrointestinal: Positive for abdominal pain. Negative for abdominal distention, anal bleeding, blood in stool, constipation, diarrhea, nausea, rectal pain and vomiting.  Musculoskeletal: Positive for arthralgias. Negative for back pain, gait problem, joint swelling, myalgias, neck pain and neck stiffness.  Skin: Negative.   Psychiatric/Behavioral: Negative.     Per HPI unless specifically indicated above     Objective:    BP 126/82   Pulse 78   Temp 97.9 F (36.6 C) (Oral)   Wt 193 lb (87.5 kg)   SpO2 96%   BMI 33.97 kg/m   Wt Readings from Last 3 Encounters:  06/16/18 193 lb (87.5 kg)  03/11/18 195 lb 1.6 oz (88.5 kg)  08/05/17 183 lb 3.2 oz (83.1 kg)    Physical Exam Vitals signs and nursing note reviewed.  Constitutional:      General: She is not in acute distress.    Appearance: Normal appearance. She is not ill-appearing, toxic-appearing or diaphoretic.  HENT:     Head: Normocephalic and  atraumatic.     Right Ear: External ear normal.     Left Ear: External ear normal.     Nose: Nose normal.     Mouth/Throat:     Mouth: Mucous membranes are moist.     Pharynx: Oropharynx is clear.  Eyes:     General: No scleral icterus.       Right eye: No discharge.        Left eye: No discharge.     Extraocular Movements: Extraocular movements intact.     Conjunctiva/sclera: Conjunctivae normal.     Pupils: Pupils are equal, round, and reactive to light.  Neck:     Musculoskeletal: Normal range of motion and neck supple.  Cardiovascular:     Rate and Rhythm: Normal rate and regular rhythm.     Pulses: Normal pulses.     Heart sounds: Normal heart sounds. No murmur. No friction rub. No gallop.   Pulmonary:     Effort: Pulmonary effort is normal. No respiratory distress.     Breath sounds: Normal breath sounds. No stridor. No wheezing, rhonchi or rales.  Chest:     Chest wall: No tenderness.  Abdominal:     General: Abdomen is flat. Bowel sounds are normal. There is no distension.     Palpations: Abdomen is soft. There is no mass.     Tenderness: There is abdominal tenderness (RUQ). There is no right CVA tenderness, left CVA tenderness, guarding or rebound.  Hernia: No hernia is present.  Musculoskeletal: Normal range of motion.  Skin:    General: Skin is warm and dry.     Capillary Refill: Capillary refill takes less than 2 seconds.     Coloration: Skin is not jaundiced or pale.     Findings: No bruising, erythema, lesion or rash.  Neurological:     General: No focal deficit present.     Mental Status: She is alert and oriented to person, place, and time. Mental status is at baseline.  Psychiatric:        Mood and Affect: Mood normal.        Behavior: Behavior normal.        Thought Content: Thought content normal.        Judgment: Judgment normal.     Results for orders placed or performed in visit on 03/11/18  Lipid Panel w/o Chol/HDL Ratio  Result Value Ref  Range   Cholesterol, Total 187 100 - 199 mg/dL   Triglycerides 035 0 - 149 mg/dL   HDL 009 >38 mg/dL   VLDL Cholesterol Cal 21 5 - 40 mg/dL   LDL Calculated 66 0 - 99 mg/dL  Microalbumin, Urine Waived  Result Value Ref Range   Microalb, Ur Waived 10 0 - 19 mg/L   Creatinine, Urine Waived 50 10 - 300 mg/dL   Microalb/Creat Ratio <30 <30 mg/g  CBC and differential  Result Value Ref Range   Hemoglobin 13.9 12.0 - 16.0   HCT 42 36 - 46   Platelets 193 150 - 399   WBC 4.2   VITAMIN D 25 Hydroxy (Vit-D Deficiency, Fractures)  Result Value Ref Range   Vit D, 25-Hydroxy 59.1   Basic metabolic panel  Result Value Ref Range   Glucose 137    BUN 13 4 - 21   Creatinine 0.7 0.5 - 1.1   Potassium 3.6 3.4 - 5.3   Sodium 143 137 - 147  Hepatic function panel  Result Value Ref Range   Alkaline Phosphatase 51 25 - 125   ALT 15 7 - 35   AST 15 13 - 35   Bilirubin, Total 0.6   Hemoglobin A1c  Result Value Ref Range   Hgb A1c MFr Bld 7.2 (A) 4.0 - 6.0  TSH  Result Value Ref Range   TSH 1.06 0.41 - 5.90  POCT erythrocyte sed rate, Non-automated  Result Value Ref Range   Sed Rate 10       Assessment & Plan:   Problem List Items Addressed This Visit      Endocrine   Type 2 diabetes mellitus with diabetic neuropathy, unspecified (HCC) - Primary    Doing well with A1c of 6.4 down from 7.2. Continue current regimen. Continue to monitor. Call with any concerns. Recheck 3 months.       Relevant Orders   Bayer DCA Hb A1c Waived    Other Visit Diagnoses    Chronic pain of right thumb       Needs surgery- will refer to hand specialist. Call with any concerns.    Relevant Medications   DULoxetine (CYMBALTA) 20 MG capsule   Other Relevant Orders   Ambulatory referral to Hand Surgery   RUQ pain       ?diaphragm spasm. Will check RUQ [place] and await results.    Relevant Orders   US Abdomen Limited RUQ       Follow up plan: Return in about 3 months (around 09/14/2018) for  Physical.

## 2018-06-23 ENCOUNTER — Ambulatory Visit
Admission: RE | Admit: 2018-06-23 | Discharge: 2018-06-23 | Disposition: A | Discharge status: Home / Self Care | Payer: BLUE CROSS/BLUE SHIELD | Source: Ambulatory Visit | Attending: Family Medicine | Admitting: Family Medicine

## 2018-06-23 ENCOUNTER — Telehealth: Payer: Self-pay | Admitting: Family Medicine

## 2018-06-23 DIAGNOSIS — K760 Fatty (change of) liver, not elsewhere classified: Secondary | ICD-10-CM

## 2018-06-23 DIAGNOSIS — K8020 Calculus of gallbladder without cholecystitis without obstruction: Secondary | ICD-10-CM | POA: Diagnosis not present

## 2018-06-23 DIAGNOSIS — R1011 Right upper quadrant pain: Secondary | ICD-10-CM | POA: Insufficient documentation

## 2018-06-23 NOTE — Telephone Encounter (Signed)
Message relayed to patient. Verbalized understanding and denied questions.   

## 2018-06-23 NOTE — Telephone Encounter (Signed)
Please let her know that she does have gall stones, so I've referred her to general surgery. They should be calling her.

## 2018-06-24 DIAGNOSIS — B372 Candidiasis of skin and nail: Secondary | ICD-10-CM | POA: Diagnosis not present

## 2018-06-24 DIAGNOSIS — L538 Other specified erythematous conditions: Secondary | ICD-10-CM | POA: Diagnosis not present

## 2018-06-26 ENCOUNTER — Ambulatory Visit: Payer: BLUE CROSS/BLUE SHIELD | Admitting: Surgery

## 2018-06-26 ENCOUNTER — Encounter: Payer: Self-pay | Admitting: *Deleted

## 2018-06-26 ENCOUNTER — Other Ambulatory Visit: Payer: Self-pay

## 2018-06-26 ENCOUNTER — Encounter: Payer: Self-pay | Admitting: Surgery

## 2018-06-26 VITALS — BP 125/79 | HR 90 | Temp 97.5°F | Resp 18 | Ht 63.0 in | Wt 194.6 lb

## 2018-06-26 DIAGNOSIS — K8020 Calculus of gallbladder without cholecystitis without obstruction: Secondary | ICD-10-CM

## 2018-06-26 NOTE — Progress Notes (Signed)
Patient wishes to call the office back to schedule lap chole with Dr. Earlene Plater after she speaks with her daughter.   The patient was provided with Pre-admission Testing paperwork today.   Patient will notify the office when she would like to proceed.

## 2018-06-26 NOTE — Patient Instructions (Addendum)
Laparoscopic Cholecystectomy Laparoscopic cholecystectomy is surgery to remove the gallbladder. The gallbladder is a pear-shaped organ that lies beneath the liver on the right side of the body. The gallbladder stores bile, which is a fluid that helps the body to digest fats. Cholecystectomy is often done for inflammation of the gallbladder (cholecystitis). This condition is usually caused by a buildup of gallstones (cholelithiasis) in the gallbladder. Gallstones can block the flow of bile, which can result in inflammation and pain. In severe cases, emergency surgery may be required. This procedure is done though small incisions in your abdomen (laparoscopic surgery). A thin scope with a camera (laparoscope) is inserted through one incision. Thin surgical instruments are inserted through the other incisions. In some cases, a laparoscopic procedure may be turned into a type of surgery that is done through a larger incision (open surgery). Tell a health care provider about:  Any allergies you have.  All medicines you are taking, including vitamins, herbs, eye drops, creams, and over-the-counter medicines.  Any problems you or family members have had with anesthetic medicines.  Any blood disorders you have.  Any surgeries you have had.  Any medical conditions you have.  Whether you are pregnant or may be pregnant. What are the risks? Generally, this is a safe procedure. However, problems may occur, including:  Infection.  Bleeding.  Allergic reactions to medicines.  Damage to other structures or organs.  A stone remaining in the common bile duct. The common bile duct carries bile from the gallbladder into the small intestine.  A bile leak from the cyst duct that is clipped when your gallbladder is removed. What happens before the procedure? Staying hydrated Follow instructions from your health care provider about hydration, which may include:  Up to 2 hours before the procedure - you  may continue to drink clear liquids, such as water, clear fruit juice, black coffee, and plain tea. Eating and drinking restrictions Follow instructions from your health care provider about eating and drinking, which may include:  8 hours before the procedure - stop eating heavy meals or foods such as meat, fried foods, or fatty foods.  6 hours before the procedure - stop eating light meals or foods, such as toast or cereal.  6 hours before the procedure - stop drinking milk or drinks that contain milk.  2 hours before the procedure - stop drinking clear liquids. Medicines  Ask your health care provider about: ? Changing or stopping your regular medicines. This is especially important if you are taking diabetes medicines or blood thinners. ? Taking medicines such as aspirin and ibuprofen. These medicines can thin your blood. Do not take these medicines before your procedure if your health care provider instructs you not to.  You may be given antibiotic medicine to help prevent infection. General instructions  Let your health care provider know if you develop a cold or an infection before surgery.  Plan to have someone take you home from the hospital or clinic.  Ask your health care provider how your surgical site will be marked or identified. What happens during the procedure?   To reduce your risk of infection: ? Your health care team will wash or sanitize their hands. ? Your skin will be washed with soap. ? Hair may be removed from the surgical area.  An IV tube may be inserted into one of your veins.  You will be given one or more of the following: ? A medicine to help you relax (sedative). ? A   medicine to make you fall asleep (general anesthetic).  A breathing tube will be placed in your mouth.  Your surgeon will make several small cuts (incisions) in your abdomen.  The laparoscope will be inserted through one of the small incisions. The camera on the laparoscope will  send images to a TV screen (monitor) in the operating room. This lets your surgeon see inside your abdomen.  Air-like gas will be pumped into your abdomen. This will expand your abdomen to give the surgeon more room to perform the surgery.  Other tools that are needed for the procedure will be inserted through the other incisions. The gallbladder will be removed through one of the incisions.  Your common bile duct may be examined. If stones are found in the common bile duct, they may be removed.  After your gallbladder has been removed, the incisions will be closed with stitches (sutures), staples, or skin glue.  Your incisions may be covered with a bandage (dressing). The procedure may vary among health care providers and hospitals. What happens after the procedure?  Your blood pressure, heart rate, breathing rate, and blood oxygen level will be monitored until the medicines you were given have worn off.  You will be given medicines as needed to control your pain.  Do not drive for 24 hours if you were given a sedative. This information is not intended to replace advice given to you by your health care provider. Make sure you discuss any questions you have with your health care provider. Document Released: 04/09/2005 Document Revised: 03/07/2017 Document Reviewed: 09/26/2015 Elsevier Interactive Patient Education  2019 [employer].    Patient will call back to schedule.

## 2018-06-26 NOTE — Progress Notes (Signed)
Surgical Clinic Progress/Follow-up Note   HPI:  58 y.o. Female presents to clinic for evaluation of her abdominal pain. Patient reports post-prandial RUQ abdominal pain, which radiates to Right back x 3 months with more chronic Right subscapular pain. She describes her post-prandial RUQ abdominal pain has been worse at night and following "heavy" meals, adding that she eats "a lot of cheese" and has noticed her pain improves if/when she doesn't eat as much fatty foods. She otherwise denies GERD with +flatus and +BM's WNL, denies fever, CP, or SOB.  Review of Systems:  Constitutional: denies any other weight loss, fever, chills, or sweats  Eyes: denies any other vision changes, history of eye injury  ENT: denies sore throat, hearing problems  Respiratory: denies shortness of breath, wheezing  Cardiovascular: denies chest pain, palpitations  Gastrointestinal: abdominal pain, N/V, and bowel function as per HPI Musculoskeletal: denies any other joint pains or cramps  Skin: Denies any other rashes or skin discolorations  Neurological: denies any other headache, dizziness, weakness  Psychiatric: denies any other depression, anxiety  All other review of systems: otherwise negative   Vital Signs:  BP 125/79   Pulse 90   Temp (!) 97.5 F (36.4 C) (Skin)   Resp 18   Ht 5\' 3"  (1.6 m)   Wt 194 lb 9.6 oz (88.3 kg)   SpO2 94%   BMI 34.47 kg/m    Physical Exam:  Constitutional:  -- Obese body habitus  -- Awake, alert, and oriented x3  Eyes:  -- Pupils equally round and reactive to light  -- No scleral icterus  Ear, nose, throat:  -- No jugular venous distension  -- No nasal drainage, bleeding Pulmonary:  -- No crackles -- Equal breath sounds bilaterally -- Breathing non-labored at rest Cardiovascular:  -- S1, S2 present  -- No pericardial rubs  Gastrointestinal:  -- Soft and non-distended with mild-/moderate- RUQ abdominal tenderness to palpation (particularly with deep  inspiration), no guarding/rebound tenderness -- former umbilical hernia repair post-surgical incision well-healed and non-tender to palpation with umbilicus remaining invaginated -- No other abdominal masses appreciated, pulsatile or otherwise  Musculoskeletal / Integumentary:  -- Wounds or skin discoloration: None appreciated  -- Extremities: B/L UE and LE FROM, hands and feet warm, no edema  Neurologic:  -- Motor function: intact and symmetric  -- Sensation: intact and symmetric   Laboratory studies:  CBC Latest Ref Rng & Units 02/27/2018 04/24/2017 11/08/2016  WBC - 4.2 3.8 9.4  Hemoglobin 12.0 - 16.0 13.9 13.0 13.5  Hematocrit 36 - 46 42 39.5 39.7  Platelets 150 - 399 193 210 237   CMP Latest Ref Rng & Units 02/27/2018 07/03/2017 04/24/2017  Glucose 65 - 99 mg/dL - 712(R) 975(O)  BUN 4 - 21 13 12 17   Creatinine 0.5 - 1.1 0.7 0.92 0.80  Sodium 137 - 147 143 141 141  Potassium 3.4 - 5.3 3.6 3.9 3.5  Chloride 96 - 106 mmol/L - 98 103  CO2 20 - 29 mmol/L - 27 30  Calcium 8.7 - 10.2 mg/dL - 10.4(H) 9.8  Total Protein 6.0 - 8.5 g/dL - 6.9 -  Total Bilirubin 0.0 - 1.2 mg/dL - 0.4 -  Alkaline Phos 25 - 125 51 55 -  AST 13 - 35 15 13 -  ALT 7 - 35 15 9 -   Imaging:  Small gallstones are present. Largest is 4.5 mm in size.  No wall thickening or Murphy sign. No pericholecystic fluid. Common bile duct diameter: 3  mm.  Assessment:  58 y.o. yo Female with a problem list including...  Patient Active Problem List   Diagnosis Date Noted  . Cholelithiasis 06/23/2018  . Fatty liver 06/23/2018  . Helicobacter pylori infection 05/13/2017  . Mastalgia in female 04/09/2017  . Bilateral leg edema 07/02/2016  . DDD (degenerative disc disease), lumbar 12/16/2014  . Facet syndrome, lumbar 12/16/2014  . Sacroiliac joint dysfunction 12/16/2014  . Neuropathy due to secondary diabetes (HCC) 12/16/2014  . Vitamin D deficiency 12/13/2014  . Obesity (BMI 30.0-34.9) 12/10/2014  . Chronic sciatica  12/10/2014  . Type 2 diabetes mellitus with diabetic neuropathy, unspecified (HCC) 12/10/2014  . Hyperlipidemia 12/10/2014  . Gastroesophageal reflux disease without esophagitis 12/10/2014    58 y.o. female with symptomatic cholelithiasis, complicated by prior umbilical hernia repair with mesh and by comorbidities described above.               - avoid/minimize foods with higher fat content (meats, cheeses/dairy, and fried)             - prefer low-fat vegetables, whole grains (wheat bread, ceareals, etc), and fruits until cholecystectomy              - all risks, benefits, and alternatives to cholecystectomy were discussed with the patient, all of her questions were answered to patient's expressed satisfaction, patient expresses she wishes to proceed, and informed consent was obtained.             - patient to call to schedule laparoscopic cholecystectomy pending anesthesia and OR availability after she asks her daughter about availability to provide transportation             - anticipate return to clinic 2 weeks after above planned surgery             - instructed to call if any questions or concerns  All of the above recommendations were discussed with the patient, and all of patient's questions were answered to her expressed satisfaction.  -- Scherrie Gerlach Earlene Plater, MD, RPVI [place]: [facility] General Surgery - Partnering for exceptional care. Office: [phone]

## 2018-06-27 ENCOUNTER — Encounter: Payer: Self-pay | Admitting: Surgery

## 2018-06-27 DIAGNOSIS — E118 Type 2 diabetes mellitus with unspecified complications: Secondary | ICD-10-CM | POA: Diagnosis not present

## 2018-06-27 DIAGNOSIS — E538 Deficiency of other specified B group vitamins: Secondary | ICD-10-CM | POA: Diagnosis not present

## 2018-06-27 DIAGNOSIS — G63 Polyneuropathy in diseases classified elsewhere: Secondary | ICD-10-CM | POA: Diagnosis not present

## 2018-06-27 DIAGNOSIS — R200 Anesthesia of skin: Secondary | ICD-10-CM | POA: Diagnosis not present

## 2018-06-28 ENCOUNTER — Other Ambulatory Visit: Payer: Self-pay | Admitting: Family Medicine

## 2018-07-01 DIAGNOSIS — M65311 Trigger thumb, right thumb: Secondary | ICD-10-CM | POA: Diagnosis not present

## 2018-07-03 ENCOUNTER — Other Ambulatory Visit: Payer: Self-pay | Admitting: Family Medicine

## 2018-07-28 ENCOUNTER — Telehealth: Payer: Self-pay | Admitting: Family Medicine

## 2018-07-28 NOTE — Telephone Encounter (Signed)
Copied from CRM [id number]. Topic: General - Inquiry >> Jul 28, 2018 11:46 AM Lynne Logan D wrote: Reason for CRM: Pt stated she was working in the yard this weekend and now her eyes are leaking. She wakes up in the morning and they are crusted together. She has tried Benadryl which helped some but did not clear up the issue. She would like to know if Dr. Laural Benes can send something to her pharmacy or if she needs her to do a virtual appointment. Please advise. CB#[phone]  [id number] [place], [place] - [address] AT [facility] OF [phone] (Phone) [phone] (Fax) >> Jul 28, 2018  2:38 PM Gabriel Cirri wrote: Please advise if patient would need virtual visit. Thank you

## 2018-07-28 NOTE — Telephone Encounter (Signed)
Please get set up for virtual visit 

## 2018-07-28 NOTE — Telephone Encounter (Signed)
Appt scheduled

## 2018-07-28 NOTE — Telephone Encounter (Signed)
Called pt to set up appt no answer left voicemail.

## 2018-07-29 ENCOUNTER — Ambulatory Visit (INDEPENDENT_AMBULATORY_CARE_PROVIDER_SITE_OTHER): Payer: BLUE CROSS/BLUE SHIELD | Admitting: Family Medicine

## 2018-07-29 ENCOUNTER — Other Ambulatory Visit: Payer: Self-pay

## 2018-07-29 ENCOUNTER — Encounter: Payer: Self-pay | Admitting: Family Medicine

## 2018-07-29 DIAGNOSIS — H1013 Acute atopic conjunctivitis, bilateral: Secondary | ICD-10-CM | POA: Diagnosis not present

## 2018-07-29 MED ORDER — MONTELUKAST SODIUM 10 MG PO TABS: 10.0000 mg | ORAL_TABLET | Freq: Every day | ORAL | 3 refills | Status: DC

## 2018-07-29 MED ORDER — OLOPATADINE HCL 0.2 % OP SOLN: OPHTHALMIC | 6 refills | Status: DC

## 2018-07-29 NOTE — Progress Notes (Signed)
There were no vitals taken for this visit.   Subjective:    Patient ID: Lisa Galvan, female    DOB: [birth year], 58 y.o.   MRN: [id number]  HPI: Lisa Galvan is a 58 y.o. female  Chief Complaint  Patient presents with  . Eye Drainage    itchy, goopy and crusty, was stuck together, but better. Patient was doing yard work and the next day this started. She currently takes Allegra daily   Notes that her eyes have been watering for about 3 days. She had been putting mulch down and it seemed to get a lot worse. Seemed to only happen when she was working with the mulch. Has been taking benadryl with fair results. Has been also taking allegra. Eyes crusty and sticking together. Have been red and irritated. Has been using eye drops OTC without much benefit. She is otherwise feeling well. She does note that her nose itches a bit. No other concerns or complaints at this time.   Relevant past medical, surgical, family and social history reviewed and updated as indicated. Interim medical history since our last visit reviewed. Allergies and medications reviewed and updated.  Review of Systems  Constitutional: Negative.   HENT: Negative for congestion, dental problem, drooling, ear discharge, ear pain, facial swelling, hearing loss, mouth sores, nosebleeds, postnasal drip, rhinorrhea, sinus pressure, sinus pain, sneezing, sore throat, tinnitus, trouble swallowing and voice change.   Eyes: Positive for redness and itching. Negative for photophobia, pain, discharge and visual disturbance.  Respiratory: Negative.   Cardiovascular: Negative.   Gastrointestinal: Negative.   Allergic/Immunologic: Positive for environmental allergies. Negative for food allergies and immunocompromised state.  Psychiatric/Behavioral: Negative.     Per HPI unless specifically indicated above     Objective:    There were no vitals taken for this visit.  Wt Readings from Last 3 Encounters:  06/26/18 194 lb 9.6 oz (88.3  kg)  06/16/18 193 lb (87.5 kg)  03/11/18 195 lb 1.6 oz (88.5 kg)    Physical Exam Vitals signs and nursing note reviewed.  Constitutional:      General: She is not in acute distress.    Appearance: Normal appearance. She is not ill-appearing, toxic-appearing or diaphoretic.  HENT:     Head: Normocephalic and atraumatic.     Right Ear: External ear normal.     Left Ear: External ear normal.     Nose: Nose normal.     Mouth/Throat:     Mouth: Mucous membranes are moist.     Pharynx: Oropharynx is clear.  Eyes:     General: No scleral icterus.       Right eye: No discharge.        Left eye: No discharge.     Extraocular Movements: Extraocular movements intact.     Pupils: Pupils are equal, round, and reactive to light.     Comments: Eyes slightly red and swollen bilaterally  Neck:     Musculoskeletal: Normal range of motion.  Pulmonary:     Effort: Pulmonary effort is normal. No respiratory distress.     Comments: Speaking in full sentences Musculoskeletal: Normal range of motion.  Skin:    Coloration: Skin is not jaundiced or pale.     Findings: No bruising, erythema, lesion or rash.  Neurological:     Mental Status: She is alert and oriented to person, place, and time. Mental status is at baseline.  Psychiatric:        Mood and Affect: Mood normal.  Behavior: Behavior normal.        Thought Content: Thought content normal.        Judgment: Judgment normal.     Results for orders placed or performed in visit on 06/16/18  Bayer DCA Hb A1c Waived  Result Value Ref Range   HB A1C (BAYER DCA - WAIVED) 6.4 <7.0 %      Assessment & Plan:   Problem List Items Addressed This Visit    None    Visit Diagnoses    Allergic conjunctivitis of both eyes    -  Primary   Continue benadryl and allegra. Add singulair and pataday. Call with any concerns or if not getting better.        Follow up plan: Return if symptoms worsen or fail to improve.   . This visit was  completed via FaceTime due to the restrictions of the COVID-19 pandemic. All issues as above were discussed and addressed. Physical exam was done as above through visual confirmation on FaceTime. If it was felt that the patient should be evaluated in the office, they were directed there. The patient verbally consented to this visit. . Location of the patient: home . Location of the provider: work . Those involved with this call:  . Provider: Olevia PerchesMegan Azani Brogdon, DO . CMA: Tiffany Reel, CMA . Front Desk/Registration: Adela Portshristan Williamson  . Time spent on call: 15 minutes with patient face to face via video conference. More than 50% of this time was spent in counseling and coordination of care.

## 2018-07-30 ENCOUNTER — Telehealth: Payer: Self-pay

## 2018-07-30 DIAGNOSIS — I10 Essential (primary) hypertension: Secondary | ICD-10-CM | POA: Diagnosis not present

## 2018-07-30 DIAGNOSIS — R600 Localized edema: Secondary | ICD-10-CM | POA: Diagnosis not present

## 2018-07-30 DIAGNOSIS — E782 Mixed hyperlipidemia: Secondary | ICD-10-CM | POA: Diagnosis not present

## 2018-07-30 NOTE — Telephone Encounter (Signed)
Tried to start a PA, insurance information was not pulling up on Cover my meds. Called patient to let her know that the eye drops are actually otc and she does not need a prescription for them.

## 2018-09-17 ENCOUNTER — Other Ambulatory Visit: Payer: Self-pay | Admitting: Family Medicine

## 2018-09-17 NOTE — Telephone Encounter (Signed)
Pt needs PHQ-2 or PHQ-9 for refill on duloxetine 20 mg per protocol.  LOV  07/29/2018 Olevia Perches, DO  NOV 10/06/2018

## 2018-09-19 ENCOUNTER — Other Ambulatory Visit: Payer: Self-pay | Admitting: Family Medicine

## 2018-10-06 ENCOUNTER — Other Ambulatory Visit: Payer: Self-pay

## 2018-10-06 ENCOUNTER — Encounter: Payer: Self-pay | Admitting: Family Medicine

## 2018-10-06 ENCOUNTER — Ambulatory Visit: Payer: BLUE CROSS/BLUE SHIELD | Admitting: Family Medicine

## 2018-10-06 VITALS — BP 107/75 | HR 99 | Temp 98.4°F | Ht 62.0 in | Wt 193.0 lb

## 2018-10-06 DIAGNOSIS — R1310 Dysphagia, unspecified: Secondary | ICD-10-CM

## 2018-10-06 DIAGNOSIS — Z0000 Encounter for general adult medical examination without abnormal findings: Secondary | ICD-10-CM

## 2018-10-06 DIAGNOSIS — K760 Fatty (change of) liver, not elsewhere classified: Secondary | ICD-10-CM

## 2018-10-06 DIAGNOSIS — E1140 Type 2 diabetes mellitus with diabetic neuropathy, unspecified: Secondary | ICD-10-CM | POA: Diagnosis not present

## 2018-10-06 DIAGNOSIS — K219 Gastro-esophageal reflux disease without esophagitis: Secondary | ICD-10-CM | POA: Diagnosis not present

## 2018-10-06 DIAGNOSIS — E785 Hyperlipidemia, unspecified: Secondary | ICD-10-CM | POA: Diagnosis not present

## 2018-10-06 DIAGNOSIS — E559 Vitamin D deficiency, unspecified: Secondary | ICD-10-CM | POA: Diagnosis not present

## 2018-10-06 DIAGNOSIS — K8020 Calculus of gallbladder without cholecystitis without obstruction: Secondary | ICD-10-CM

## 2018-10-06 DIAGNOSIS — I10 Essential (primary) hypertension: Secondary | ICD-10-CM

## 2018-10-06 DIAGNOSIS — R1319 Other dysphagia: Secondary | ICD-10-CM

## 2018-10-06 DIAGNOSIS — E1340 Other specified diabetes mellitus with diabetic neuropathy, unspecified: Secondary | ICD-10-CM

## 2018-10-06 DIAGNOSIS — E119 Type 2 diabetes mellitus without complications: Secondary | ICD-10-CM

## 2018-10-06 DIAGNOSIS — Z8709 Personal history of other diseases of the respiratory system: Secondary | ICD-10-CM

## 2018-10-06 LAB — UA/M W/RFLX CULTURE, ROUTINE
Bilirubin, UA: NEGATIVE
Glucose, UA: NEGATIVE
Leukocytes,UA: NEGATIVE
Nitrite, UA: NEGATIVE
RBC, UA: NEGATIVE
Specific Gravity, UA: 1.02 (ref 1.005–1.030)
Urobilinogen, Ur: 0.2 mg/dL (ref 0.2–1.0)
pH, UA: 5 (ref 5.0–7.5)

## 2018-10-06 LAB — MICROSCOPIC EXAMINATION
Bacteria, UA: NONE SEEN
RBC, Urine: NONE SEEN /hpf (ref 0–2)
WBC, UA: NONE SEEN /hpf (ref 0–5)

## 2018-10-06 LAB — BAYER DCA HB A1C WAIVED: HB A1C (BAYER DCA - WAIVED): 6.4 % (ref <7.0)

## 2018-10-06 LAB — MICROALBUMIN, URINE WAIVED
Creatinine, Urine Waived: 200 mg/dL (ref 10–300)
Microalb, Ur Waived: 80 mg/L — ABNORMAL HIGH (ref 0–19)

## 2018-10-06 MED ORDER — KETOCONAZOLE 2 % EX CREA: 1.0000 "application " | TOPICAL_CREAM | Freq: Every day | CUTANEOUS | 1 refills | Status: DC

## 2018-10-06 MED ORDER — PIOGLITAZONE HCL 30 MG PO TABS: ORAL_TABLET | ORAL | 6 refills | Status: DC

## 2018-10-06 MED ORDER — MONTELUKAST SODIUM 10 MG PO TABS: 10.0000 mg | ORAL_TABLET | Freq: Every day | ORAL | 6 refills | Status: DC

## 2018-10-06 MED ORDER — IBUPROFEN 800 MG PO TABS: ORAL_TABLET | ORAL | 6 refills | Status: DC

## 2018-10-06 MED ORDER — DULOXETINE HCL 20 MG PO CPEP: ORAL_CAPSULE | ORAL | 6 refills | Status: DC

## 2018-10-06 MED ORDER — AMLODIPINE BESYLATE 5 MG PO TABS: ORAL_TABLET | ORAL | 6 refills | Status: DC

## 2018-10-06 MED ORDER — ATORVASTATIN CALCIUM 20 MG PO TABS: 20.0000 mg | ORAL_TABLET | Freq: Every day | ORAL | 6 refills | Status: DC

## 2018-10-06 MED ORDER — JARDIANCE 10 MG PO TABS: 10.0000 mg | ORAL_TABLET | Freq: Every day | ORAL | 6 refills | Status: DC

## 2018-10-06 MED ORDER — PANTOPRAZOLE SODIUM 40 MG PO TBEC: DELAYED_RELEASE_TABLET | ORAL | 6 refills | Status: DC

## 2018-10-06 MED ORDER — OLOPATADINE HCL 0.2 % OP SOLN: OPHTHALMIC | 6 refills | Status: DC

## 2018-10-06 MED ORDER — METFORMIN HCL 500 MG PO TABS: 500.0000 mg | ORAL_TABLET | Freq: Two times a day (BID) | ORAL | 6 refills | Status: DC

## 2018-10-06 NOTE — Assessment & Plan Note (Signed)
Due to BMI >35 with GERD, arthritis, DM, HTN and HLD. Will work on diet and exercise with goal of losing 1-2lbs per week. Call with any concerns.

## 2018-10-06 NOTE — Assessment & Plan Note (Signed)
Feeling well. Would like to hold on surgery at this time. Discussed risks/benefits. Advised follow up with General surgery.

## 2018-10-06 NOTE — Assessment & Plan Note (Signed)
Rechecking labs today. Will adjust medication as needed. Continue to monitor. Call with any concerns.

## 2018-10-06 NOTE — Assessment & Plan Note (Signed)
Rechecking labs today. Await results. Call with any concerns. Treat as needed.  

## 2018-10-06 NOTE — Patient Instructions (Signed)
Health Maintenance for Postmenopausal Women Menopause is a normal process in which your reproductive ability comes to an end. This process happens gradually over a span of months to years, usually between the ages of 48 and 55. Menopause is complete when you have missed 12 consecutive menstrual periods. It is important to talk with your health care provider about some of the most common conditions that affect postmenopausal women, such as heart disease, cancer, and bone loss (osteoporosis). Adopting a healthy lifestyle and getting preventive care can help to promote your health and wellness. Those actions can also lower your chances of developing some of these common conditions. What should I know about menopause? During menopause, you may experience a number of symptoms, such as:  Moderate-to-severe hot flashes.  Night sweats.  Decrease in sex drive.  Mood swings.  Headaches.  Tiredness.  Irritability.  Memory problems.  Insomnia. Choosing to treat or not to treat menopausal changes is an individual decision that you make with your health care provider. What should I know about hormone replacement therapy and supplements? Hormone therapy products are effective for treating symptoms that are associated with menopause, such as hot flashes and night sweats. Hormone replacement carries certain risks, especially as you become older. If you are thinking about using estrogen or estrogen with progestin treatments, discuss the benefits and risks with your health care provider. What should I know about heart disease and stroke? Heart disease, heart attack, and stroke become more likely as you age. This may be due, in part, to the hormonal changes that your body experiences during menopause. These can affect how your body processes dietary fats, triglycerides, and cholesterol. Heart attack and stroke are both medical emergencies. There are many things that you can do to help prevent heart disease  and stroke:  Have your blood pressure checked at least every 1-2 years. High blood pressure causes heart disease and increases the risk of stroke.  If you are 60-79 years old, ask your health care provider if you should take aspirin to prevent a heart attack or a stroke.  Do not use any tobacco products, including cigarettes, chewing tobacco, or electronic cigarettes. If you need help quitting, ask your health care provider.  It is important to eat a healthy diet and maintain a healthy weight. ? Be sure to include plenty of vegetables, fruits, low-fat dairy products, and lean protein. ? Avoid eating foods that are high in solid fats, added sugars, or salt (sodium).  Get regular exercise. This is one of the most important things that you can do for your health. ? Try to exercise for at least 150 minutes each week. The type of exercise that you do should increase your heart rate and make you sweat. This is known as moderate-intensity exercise. ? Try to do strengthening exercises at least twice each week. Do these in addition to the moderate-intensity exercise.  Know your numbers.Ask your health care provider to check your cholesterol and your blood glucose. Continue to have your blood tested as directed by your health care provider.  What should I know about cancer screening? There are several types of cancer. Take the following steps to reduce your risk and to catch any cancer development as early as possible. Breast Cancer  Practice breast self-awareness. ? This means understanding how your breasts normally appear and feel. ? It also means doing regular breast self-exams. Let your health care provider know about any changes, no matter how small.  If you are 40 or   older, have a clinician do a breast exam (clinical breast exam or CBE) every year. Depending on your age, family history, and medical history, it may be recommended that you also have a yearly breast X-ray (mammogram).  If you  have a family history of breast cancer, talk with your health care provider about genetic screening.  If you are at high risk for breast cancer, talk with your health care provider about having an MRI and a mammogram every year.  Breast cancer (BRCA) gene test is recommended for women who have family members with BRCA-related cancers. Results of the assessment will determine the need for genetic counseling and BRCA1 and for BRCA2 testing. BRCA-related cancers include these types: ? Breast. This occurs in males or females. ? Ovarian. ? Tubal. This may also be called fallopian tube cancer. ? Cancer of the abdominal or pelvic lining (peritoneal cancer). ? Prostate. ? Pancreatic. Cervical, Uterine, and Ovarian Cancer Your health care provider may recommend that you be screened regularly for cancer of the pelvic organs. These include your ovaries, uterus, and vagina. This screening involves a pelvic exam, which includes checking for microscopic changes to the surface of your cervix (Pap test).  For women ages 60-65, health care providers may recommend a pelvic exam and a Pap test every three years. For women ages 60-65, they may recommend the Pap test and pelvic exam, combined with testing for human papilloma virus (HPV), every five years. Some types of HPV increase your risk of cervical cancer. Testing for HPV may also be done on women of any age who have unclear Pap test results.  Other health care providers may not recommend any screening for nonpregnant women who are considered low risk for pelvic cancer and have no symptoms. Ask your health care provider if a screening pelvic exam is right for you.  If you have had past treatment for cervical cancer or a condition that could lead to cancer, you need Pap tests and screening for cancer for at least 20 years after your treatment. If Pap tests have been discontinued for you, your risk factors (such as having a new sexual partner) need to be reassessed  to determine if you should start having screenings again. Some women have medical problems that increase the chance of getting cervical cancer. In these cases, your health care provider may recommend that you have screening and Pap tests more often.  If you have a family history of uterine cancer or ovarian cancer, talk with your health care provider about genetic screening.  If you have vaginal bleeding after reaching menopause, tell your health care provider.  There are currently no reliable tests available to screen for ovarian cancer. Lung Cancer Lung cancer screening is recommended for adults 55-80 years old who are at high risk for lung cancer because of a history of smoking. A yearly low-dose CT scan of the lungs is recommended if you:  Currently smoke.  Have a history of at least 30 pack-years of smoking and you currently smoke or have quit within the past 15 years. A pack-year is smoking an average of one pack of cigarettes per day for one year. Yearly screening should:  Continue until it has been 15 years since you quit.  Stop if you develop a health problem that would prevent you from having lung cancer treatment. Colorectal Cancer  This type of cancer can be detected and can often be prevented.  Routine colorectal cancer screening usually begins at age 50 and continues through   age 75.  If you have risk factors for colon cancer, your health care provider may recommend that you be screened at an earlier age.  If you have a family history of colorectal cancer, talk with your health care provider about genetic screening.  Your health care provider may also recommend using home test kits to check for hidden blood in your stool.  A small camera at the end of a tube can be used to examine your colon directly (sigmoidoscopy or colonoscopy). This is done to check for the earliest forms of colorectal cancer.  Direct examination of the colon should be repeated every 5-10 years until  age 75. However, if early forms of precancerous polyps or small growths are found or if you have a family history or genetic risk for colorectal cancer, you may need to be screened more often. Skin Cancer  Check your skin from head to toe regularly.  Monitor any moles. Be sure to tell your health care provider: ? About any new moles or changes in moles, especially if there is a change in a mole's shape or color. ? If you have a mole that is larger than the size of a pencil eraser.  If any of your family members has a history of skin cancer, especially at a young age, talk with your health care provider about genetic screening.  Always use sunscreen. Apply sunscreen liberally and repeatedly throughout the day.  Whenever you are outside, protect yourself by wearing long sleeves, pants, a wide-brimmed hat, and sunglasses. What should I know about osteoporosis? Osteoporosis is a condition in which bone destruction happens more quickly than new bone creation. After menopause, you may be at an increased risk for osteoporosis. To help prevent osteoporosis or the bone fractures that can happen because of osteoporosis, the following is recommended:  If you are 19-50 years old, get at least 1,000 mg of calcium and at least 600 mg of vitamin D per day.  If you are older than age 50 but younger than age 70, get at least 1,200 mg of calcium and at least 600 mg of vitamin D per day.  If you are older than age 70, get at least 1,200 mg of calcium and at least 800 mg of vitamin D per day. Smoking and excessive alcohol intake increase the risk of osteoporosis. Eat foods that are rich in calcium and vitamin D, and do weight-bearing exercises several times each week as directed by your health care provider. What should I know about how menopause affects my mental health? Depression may occur at any age, but it is more common as you become older. Common symptoms of depression include:  Low or sad  mood.  Changes in sleep patterns.  Changes in appetite or eating patterns.  Feeling an overall lack of motivation or enjoyment of activities that you previously enjoyed.  Frequent crying spells. Talk with your health care provider if you think that you are experiencing depression. What should I know about immunizations? It is important that you get and maintain your immunizations. These include:  Tetanus, diphtheria, and pertussis (Tdap) booster vaccine.  Influenza every year before the flu season begins.  Pneumonia vaccine.  Shingles vaccine. Your health care provider may also recommend other immunizations. This information is not intended to replace advice given to you by your health care provider. Make sure you discuss any questions you have with your health care provider. Document Released: 06/01/2005 Document Revised: 10/28/2015 Document Reviewed: 01/11/2015 Elsevier Interactive Patient Education    2019 [place] Maintenance, Female Adopting a healthy lifestyle and getting preventive care can go a long way to promote health and wellness. Talk with your health care provider about what schedule of regular examinations is right for you. This is a good chance for you to check in with your provider about disease prevention and staying healthy. In between checkups, there are plenty of things you can do on your own. Experts have done a lot of research about which lifestyle changes and preventive measures are most likely to keep you healthy. Ask your health care provider for more information. Weight and diet Eat a healthy diet  Be sure to include plenty of vegetables, fruits, low-fat dairy products, and lean protein.  Do not eat a lot of foods high in solid fats, added sugars, or salt.  Get regular exercise. This is one of the most important things you can do for your health. ? Most adults should exercise for at least 150 minutes each week. The exercise should increase your  heart rate and make you sweat (moderate-intensity exercise). ? Most adults should also do strengthening exercises at least twice a week. This is in addition to the moderate-intensity exercise. Maintain a healthy weight  Body mass index (BMI) is a measurement that can be used to identify possible weight problems. It estimates body fat based on height and weight. Your health care provider can help determine your BMI and help you achieve or maintain a healthy weight.  For females 79 years of age and older: ? A BMI below 18.5 is considered underweight. ? A BMI of 18.5 to 24.9 is normal. ? A BMI of 25 to 29.9 is considered overweight. ? A BMI of 30 and above is considered obese. Watch levels of cholesterol and blood lipids  You should start having your blood tested for lipids and cholesterol at 60 years of age, then have this test every 5 years.  You may need to have your cholesterol levels checked more often if: ? Your lipid or cholesterol levels are high. ? You are older than 60 years of age. ? You are at high risk for heart disease. Cancer screening Lung Cancer  Lung cancer screening is recommended for adults 36-36 years old who are at high risk for lung cancer because of a history of smoking.  A yearly low-dose CT scan of the lungs is recommended for people who: ? Currently smoke. ? Have quit within the past 15 years. ? Have at least a 30-pack-year history of smoking. A pack year is smoking an average of one pack of cigarettes a day for 1 year.  Yearly screening should continue until it has been 15 years since you quit.  Yearly screening should stop if you develop a health problem that would prevent you from having lung cancer treatment. Breast Cancer  Practice breast self-awareness. This means understanding how your breasts normally appear and feel.  It also means doing regular breast self-exams. Let your health care provider know about any changes, no matter how small.  If you  are in your 20s or 30s, you should have a clinical breast exam (CBE) by a health care provider every 1-3 years as part of a regular health exam.  If you are 30 or older, have a CBE every year. Also consider having a breast X-ray (mammogram) every year.  If you have a family history of breast cancer, talk to your health care provider about genetic screening.  If you are at high risk  for breast cancer, talk to your health care provider about having an MRI and a mammogram every year.  Breast cancer gene (BRCA) assessment is recommended for women who have family members with BRCA-related cancers. BRCA-related cancers include: ? Breast. ? Ovarian. ? Tubal. ? Peritoneal cancers.  Results of the assessment will determine the need for genetic counseling and BRCA1 and BRCA2 testing. Cervical Cancer Your health care provider may recommend that you be screened regularly for cancer of the pelvic organs (ovaries, uterus, and vagina). This screening involves a pelvic examination, including checking for microscopic changes to the surface of your cervix (Pap test). You may be encouraged to have this screening done every 3 years, beginning at age 43.  For women ages 90-65, health care providers may recommend pelvic exams and Pap testing every 3 years, or they may recommend the Pap and pelvic exam, combined with testing for human papilloma virus (HPV), every 5 years. Some types of HPV increase your risk of cervical cancer. Testing for HPV may also be done on women of any age with unclear Pap test results.  Other health care providers may not recommend any screening for nonpregnant women who are considered low risk for pelvic cancer and who do not have symptoms. Ask your health care provider if a screening pelvic exam is right for you.  If you have had past treatment for cervical cancer or a condition that could lead to cancer, you need Pap tests and screening for cancer for at least 20 years after your  treatment. If Pap tests have been discontinued, your risk factors (such as having a new sexual partner) need to be reassessed to determine if screening should resume. Some women have medical problems that increase the chance of getting cervical cancer. In these cases, your health care provider may recommend more frequent screening and Pap tests. Colorectal Cancer  This type of cancer can be detected and often prevented.  Routine colorectal cancer screening usually begins at 60 years of age and continues through 60 years of age.  Your health care provider may recommend screening at an earlier age if you have risk factors for colon cancer.  Your health care provider may also recommend using home test kits to check for hidden blood in the stool.  A small camera at the end of a tube can be used to examine your colon directly (sigmoidoscopy or colonoscopy). This is done to check for the earliest forms of colorectal cancer.  Routine screening usually begins at age 46.  Direct examination of the colon should be repeated every 5-10 years through 60 years of age. However, you may need to be screened more often if early forms of precancerous polyps or small growths are found. Skin Cancer  Check your skin from head to toe regularly.  Tell your health care provider about any new moles or changes in moles, especially if there is a change in a mole's shape or color.  Also tell your health care provider if you have a mole that is larger than the size of a pencil eraser.  Always use sunscreen. Apply sunscreen liberally and repeatedly throughout the day.  Protect yourself by wearing long sleeves, pants, a wide-brimmed hat, and sunglasses whenever you are outside. Heart disease, diabetes, and high blood pressure  High blood pressure causes heart disease and increases the risk of stroke. High blood pressure is more likely to develop in: ? People who have blood pressure in the high end of the normal range  (130-139/85-89  mm Hg). ? People who are overweight or obese. ? People who are African American.  If you are 29-68 years of age, have your blood pressure checked every 3-5 years. If you are 54 years of age or older, have your blood pressure checked every year. You should have your blood pressure measured twice--once when you are at a hospital or clinic, and once when you are not at a hospital or clinic. Record the average of the two measurements. To check your blood pressure when you are not at a hospital or clinic, you can use: ? An automated blood pressure machine at a pharmacy. ? A home blood pressure monitor.  If you are between 79 years and 47 years old, ask your health care provider if you should take aspirin to prevent strokes.  Have regular diabetes screenings. This involves taking a blood sample to check your fasting blood sugar level. ? If you are at a normal weight and have a low risk for diabetes, have this test once every three years after 60 years of age. ? If you are overweight and have a high risk for diabetes, consider being tested at a younger age or more often. Preventing infection Hepatitis B  If you have a higher risk for hepatitis B, you should be screened for this virus. You are considered at high risk for hepatitis B if: ? You were born in a country where hepatitis B is common. Ask your health care provider which countries are considered high risk. ? Your parents were born in a high-risk country, and you have not been immunized against hepatitis B (hepatitis B vaccine). ? You have HIV or AIDS. ? You use needles to inject street drugs. ? You live with someone who has hepatitis B. ? You have had sex with someone who has hepatitis B. ? You get hemodialysis treatment. ? You take certain medicines for conditions, including cancer, organ transplantation, and autoimmune conditions. Hepatitis C  Blood testing is recommended for: ? Everyone born from [birth year] through  1967. ? Anyone with known risk factors for hepatitis C. Sexually transmitted infections (STIs)  You should be screened for sexually transmitted infections (STIs) including gonorrhea and chlamydia if: ? You are sexually active and are younger than 60 years of age. ? You are older than 60 years of age and your health care provider tells you that you are at risk for this type of infection. ? Your sexual activity has changed since you were last screened and you are at an increased risk for chlamydia or gonorrhea. Ask your health care provider if you are at risk.  If you do not have HIV, but are at risk, it may be recommended that you take a prescription medicine daily to prevent HIV infection. This is called pre-exposure prophylaxis (PrEP). You are considered at risk if: ? You are sexually active and do not regularly use condoms or know the HIV status of your partner(s). ? You take drugs by injection. ? You are sexually active with a partner who has HIV. Talk with your health care provider about whether you are at high risk of being infected with HIV. If you choose to begin PrEP, you should first be tested for HIV. You should then be tested every 3 months for as long as you are taking PrEP. Pregnancy  If you are premenopausal and you may become pregnant, ask your health care provider about preconception counseling.  If you may become pregnant, take 400 to 800 micrograms (mcg) of  folic acid every day.  If you want to prevent pregnancy, talk to your health care provider about birth control (contraception). Osteoporosis and menopause  Osteoporosis is a disease in which the bones lose minerals and strength with aging. This can result in serious bone fractures. Your risk for osteoporosis can be identified using a bone density scan.  If you are 47 years of age or older, or if you are at risk for osteoporosis and fractures, ask your health care provider if you should be screened.  Ask your health  care provider whether you should take a calcium or vitamin D supplement to lower your risk for osteoporosis.  Menopause may have certain physical symptoms and risks.  Hormone replacement therapy may reduce some of these symptoms and risks. Talk to your health care provider about whether hormone replacement therapy is right for you. Follow these instructions at home:  Schedule regular health, dental, and eye exams.  Stay current with your immunizations.  Do not use any tobacco products including cigarettes, chewing tobacco, or electronic cigarettes.  If you are pregnant, do not drink alcohol.  If you are breastfeeding, limit how much and how often you drink alcohol.  Limit alcohol intake to no more than 1 drink per day for nonpregnant women. One drink equals 12 ounces of beer, 5 ounces of wine, or 1 ounces of hard liquor.  Do not use street drugs.  Do not share needles.  Ask your health care provider for help if you need support or information about quitting drugs.  Tell your health care provider if you often feel depressed.  Tell your health care provider if you have ever been abused or do not feel safe at home. This information is not intended to replace advice given to you by your health care provider. Make sure you discuss any questions you have with your health care provider. Document Released: 10/23/2010 Document Revised: 09/15/2015 Document Reviewed: 01/11/2015 Elsevier Interactive Patient Education  2019 [employer].

## 2018-10-06 NOTE — Assessment & Plan Note (Signed)
Under good control on current regimen. Continue current regimen. Continue to monitor. Call with any concerns. Refills given. Labs checked today.  

## 2018-10-06 NOTE — Assessment & Plan Note (Signed)
Rechecking labs today. Await results. Call with any concerns.  

## 2018-10-06 NOTE — Assessment & Plan Note (Signed)
Under good control on current regimen. Continue current regimen. Continue to monitor. Call with any concerns. Refills given. Labs drawn today.   

## 2018-10-06 NOTE — Progress Notes (Signed)
BP 107/75    Pulse 99    Temp 98.4 F (36.9 C) (Oral)    Ht  (1.575 m)    Wt 193 lb (87.5 kg)    SpO2 97%    BMI 35.30 kg/m    Subjective:    Patient ID: Lisa Galvan, female    DOB: 12-30-1960, 58 y.o.   MRN: [id number]  HPI: Lisa Galvan is a 58 y.o. female presenting on 10/06/2018 for comprehensive medical examination. Current medical complaints include:  Was having some pain in her back on the L side in the middle of her back. Has been going on for about a month. Usually sore in nature. Radiates into her arm pit. Nothing better makes it better or worse. Hasn't tried anything for it. She takes ibuprofen regularly and that seems to help, but it doesn't make it go away.   Thumb still hurting. She notes that she has not had surgery on her hand because the surgeon only does surgery in [place].  DIABETES Hypoglycemic episodes:no Polydipsia/polyuria: no Visual disturbance: no Chest pain: no Paresthesias: no Glucose Monitoring: yes  Accucheck frequency: Daily  Fasting glucose: below 110 Taking Insulin?: no Blood Pressure Monitoring: not checking Retinal Examination: Up to Date Foot Exam: Up to Date Diabetic Education: Completed Pneumovax: Up to Date Influenza: Up to Date Aspirin: no  HYPERTENSION / HYPERLIPIDEMIA Satisfied with current treatment? yes Duration of hypertension: chronic BP monitoring frequency: not checking BP medication side effects: no Past BP meds: amlodipine Duration of hyperlipidemia: chronic Cholesterol medication side effects: no Cholesterol supplements: none Past cholesterol medications: atorvastatin Medication compliance: excellent compliance Aspirin: no Recent stressors: no Recurrent headaches: no Visual changes: no Palpitations: no Dyspnea: no Chest pain: no Lower extremity edema: no Dizzy/lightheaded: no  Menopausal Symptoms: yes- hot flashes  Depression Screen done today and results listed below:  Depression screen [facility] 2/9 03/11/2018  07/03/2017 07/03/2017 01/03/2017 12/29/2014  Decreased Interest 0 0 0 0 0  Down, Depressed, Hopeless 0 0 0 0 0  PHQ - 2 Score 0 0 0 0 0  Altered sleeping 0 0 - 0 -  Tired, decreased energy 0 0 - 0 -  Change in appetite 0 0 - 0 -  Feeling bad or failure about yourself  0 0 - 0 -  Trouble concentrating 0 0 - 0 -  Moving slowly or fidgety/restless 0 0 - 0 -  Suicidal thoughts 0 0 - 0 -  PHQ-9 Score 0 0 - 0 -  Difficult doing work/chores Not difficult at all - - - -    Past Medical History:  Past Medical History:  Diagnosis Date   Allergy    Arthritis    spine   Bilateral leg edema    Chest pain 12/15/2014   Chronic sciatica 12/10/2014   Diabetes mellitus without complication (HCC)    Essential hypertension 12/10/2014   Essential hypertension 12/10/2014   Gastroesophageal reflux disease without esophagitis    GERD (gastroesophageal reflux disease)    Hyperlipidemia    Hypertension    Mixed hyperlipidemia    Obesity (BMI 30.0-34.9) 12/10/2014   Type 2 diabetes mellitus without complication (HCC) 12/10/2014    Surgical History:  Past Surgical History:  Procedure Laterality Date   ABDOMINAL HYSTERECTOMY     ESOPHAGOGASTRODUODENOSCOPY (EGD) WITH PROPOFOL N/A 03/12/2017   Procedure: ESOPHAGOGASTRODUODENOSCOPY (EGD) WITH PROPOFOL;  Surgeon: Toney Reil, MD;  Location: [facility] ENDOSCOPY;  Service: Gastroenterology;  Laterality: N/A;   HERNIA REPAIR  UMBILICAL HERNIA REPAIR N/A 05/01/2017   Procedure: HERNIA REPAIR UMBILICAL ADULT;  Surgeon: Ancil Linseyavis, Jason Evan, MD;  Location: [facility] ORS;  Service: General;  Laterality: N/A;    Medications:  Current Outpatient Medications on File Prior to Visit  Medication Sig   Cholecalciferol (VITAMIN D-3) 5000 units TABS Take 5,000 Units by mouth daily.   EPINEPHrine (EPIPEN 2-PAK) 0.3 mg/0.3 mL IJ SOAJ injection Inject 0.3 mLs (0.3 mg total) into the muscle once. Take for severe allergic reaction, then come immediately to the  Emergency Department or call 911.   fexofenadine (ALLEGRA) 180 MG tablet Take 180 mg by mouth daily.   valACYclovir (VALTREX) 500 MG tablet Take 500 mg by mouth 2 (two) times daily as needed (for outbreak).    No current facility-administered medications on file prior to visit.     Allergies:  Allergies  Allergen Reactions   Amlodipine Besy-Benazepril Hcl Anaphylaxis   Other Anaphylaxis   Shellfish Allergy Swelling and Anaphylaxis   Shellfish-Derived Products Anaphylaxis   Ace Inhibitors Swelling    Social History:  Social History   Socioeconomic History   Marital status: Widowed    Spouse name: Not on file   Number of children: Not on file   Years of education: Not on file   Highest education level: Not on file  Occupational History   Not on file  Social Needs   Financial resource strain: Not on file   Food insecurity    Worry: Not on file    Inability: Not on file   Transportation needs    Medical: Not on file    Non-medical: Not on file  Tobacco Use   Smoking status: Never Smoker   Smokeless tobacco: Never Used  Substance and Sexual Activity   Alcohol use: Yes    Alcohol/week: 2.0 standard drinks    Types: 2 Glasses of wine per week    Comment: rare wine   Drug use: No   Sexual activity: Not Currently  Lifestyle   Physical activity    Days per week: Not on file    Minutes per session: Not on file   Stress: Not on file  Relationships   Social connections    Talks on phone: Not on file    Gets together: Not on file    Attends religious service: Not on file    Active member of club or organization: Not on file    Attends meetings of clubs or organizations: Not on file    Relationship status: Not on file   Intimate partner violence    Fear of current or ex partner: Not on file    Emotionally abused: Not on file    Physically abused: Not on file    Forced sexual activity: Not on file  Other Topics Concern   Not on file  Social  History Narrative   Not on file   Social History   Tobacco Use  Smoking Status Never Smoker  Smokeless Tobacco Never Used   Social History   Substance and Sexual Activity  Alcohol Use Yes   Alcohol/week: 2.0 standard drinks   Types: 2 Glasses of wine per week   Comment: rare wine    Family History:  Family History  Problem Relation Age of Onset   Diabetes Mother    Cancer Mother        breast and stomach   Breast cancer Mother [id number]   Heart disease Father    Kidney disease Father  dialysis   Cancer Father        colon   Diabetes Father    Hypertension Sister    Hypertension Brother    Congestive Heart Failure Maternal Grandmother    Diabetes Maternal Grandmother    Hypertension Maternal Grandmother     Past medical history, surgical history, medications, allergies, family history and social history reviewed with patient today and changes made to appropriate areas of the chart.   Review of Systems  Constitutional: Negative.   HENT: Negative.   Eyes: Negative.   Respiratory: Negative.   Cardiovascular: Negative.   Gastrointestinal: Negative.   Genitourinary: Negative.   Musculoskeletal: Positive for back pain. Negative for falls, joint pain, myalgias and neck pain.  Skin: Negative.   Neurological: Negative.   Endo/Heme/Allergies: Positive for environmental allergies. Negative for polydipsia. Bruises/bleeds easily.  Psychiatric/Behavioral: Negative.     All other ROS negative except what is listed above and in the HPI.      Objective:    BP 107/75    Pulse 99    Temp 98.4 F (36.9 C) (Oral)    Ht 5\' 2"  (1.575 m)    Wt 193 lb (87.5 kg)    SpO2 97%    BMI 35.30 kg/m   Wt Readings from Last 3 Encounters:  10/06/18 193 lb (87.5 kg)  06/26/18 194 lb 9.6 oz (88.3 kg)  06/16/18 193 lb (87.5 kg)    Physical Exam Vitals signs and nursing note reviewed.  Constitutional:      General: She is not in acute distress.    Appearance: Normal  appearance. She is not ill-appearing, toxic-appearing or diaphoretic.  HENT:     Head: Normocephalic and atraumatic.     Right Ear: Tympanic membrane, ear canal and external ear normal. There is no impacted cerumen.     Left Ear: Tympanic membrane, ear canal and external ear normal. There is no impacted cerumen.     Nose: Nose normal. No congestion or rhinorrhea.     Mouth/Throat:     Mouth: Mucous membranes are moist.     Pharynx: Oropharynx is clear. No oropharyngeal exudate or posterior oropharyngeal erythema.  Eyes:     General: No scleral icterus.       Right eye: No discharge.        Left eye: No discharge.     Extraocular Movements: Extraocular movements intact.     Conjunctiva/sclera: Conjunctivae normal.     Pupils: Pupils are equal, round, and reactive to light.  Neck:     Musculoskeletal: Normal range of motion and neck supple. No neck rigidity or muscular tenderness.     Vascular: No carotid bruit.  Cardiovascular:     Rate and Rhythm: Normal rate and regular rhythm.     Pulses: Normal pulses.     Heart sounds: No murmur. No friction rub. No gallop.   Pulmonary:     Effort: Pulmonary effort is normal. No respiratory distress.     Breath sounds: Normal breath sounds. No stridor. No wheezing, rhonchi or rales.  Chest:     Chest wall: No tenderness.  Abdominal:     General: Abdomen is flat. Bowel sounds are normal. There is no distension.     Palpations: Abdomen is soft. There is no mass.     Tenderness: There is no abdominal tenderness. There is no right CVA tenderness, left CVA tenderness, guarding or rebound.     Hernia: No hernia is present.  Genitourinary:    Comments:  Breast and pelvic exams deferred with shared decision making Musculoskeletal:        General: No swelling, tenderness, deformity or signs of injury.     Right lower leg: No edema.     Left lower leg: No edema.  Lymphadenopathy:     Cervical: No cervical adenopathy.  Skin:    General: Skin is warm  and dry.     Capillary Refill: Capillary refill takes less than 2 seconds.     Coloration: Skin is not jaundiced or pale.     Findings: No bruising, erythema, lesion or rash.  Neurological:     General: No focal deficit present.     Mental Status: She is alert and oriented to person, place, and time. Mental status is at baseline.     Cranial Nerves: No cranial nerve deficit.     Sensory: No sensory deficit.     Motor: No weakness.     Coordination: Coordination normal.     Gait: Gait normal.     Deep Tendon Reflexes: Reflexes normal.  Psychiatric:        Mood and Affect: Mood normal.        Behavior: Behavior normal.        Thought Content: Thought content normal.        Judgment: Judgment normal.     Results for orders placed or performed in visit on 06/16/18  Bayer DCA Hb A1c Waived  Result Value Ref Range   HB A1C (BAYER DCA - WAIVED) 6.4 <7.0 %      Assessment & Plan:   Problem List Items Addressed This Visit      Cardiovascular and Mediastinum   Essential hypertension    Under good control on current regimen. Continue current regimen. Continue to monitor. Call with any concerns. Refills given. Labs drawn today.       Relevant Medications   atorvastatin (LIPITOR) 20 MG tablet   amLODipine (NORVASC) 5 MG tablet     Digestive   Gastroesophageal reflux disease without esophagitis    Under good control on current regimen. Continue current regimen. Continue to monitor. Call with any concerns. Refills given. Labs checked today.       Relevant Medications   pantoprazole (PROTONIX) 40 MG tablet   Other Relevant Orders   CBC with Differential/Platelet   Comprehensive metabolic panel   TSH   UA/M w/rflx Culture, Routine   Cholelithiasis    Feeling well. Would like to hold on surgery at this time. Discussed risks/benefits. Advised follow up with General surgery.       Fatty liver    Rechecking labs today. Await results. Call with any concerns.       Relevant  Orders   CBC with Differential/Platelet   Comprehensive metabolic panel   TSH   UA/M w/rflx Culture, Routine     Endocrine   Neuropathy due to secondary diabetes (HCC)    Under good control on current regimen. Continue current regimen. Continue to monitor. Call with any concerns. Refills given. Labs drawn today.       Relevant Medications   pioglitazone (ACTOS) 30 MG tablet   metFORMIN (GLUCOPHAGE) 500 MG tablet   empagliflozin (JARDIANCE) 10 MG TABS tablet   atorvastatin (LIPITOR) 20 MG tablet   Other Relevant Orders   Bayer DCA Hb A1c Waived   CBC with Differential/Platelet   Comprehensive metabolic panel   TSH   UA/M w/rflx Culture, Routine     Nervous and Auditory   Type  2 diabetes mellitus with diabetic neuropathy, unspecified (HCC)    Rechecking labs today. Will adjust medication as needed. Continue to monitor. Call with any concerns.       Relevant Medications   pioglitazone (ACTOS) 30 MG tablet   metFORMIN (GLUCOPHAGE) 500 MG tablet   empagliflozin (JARDIANCE) 10 MG TABS tablet   atorvastatin (LIPITOR) 20 MG tablet   Other Relevant Orders   Bayer DCA Hb A1c Waived   CBC with Differential/Platelet   Comprehensive metabolic panel   Microalbumin, Urine Waived   TSH   UA/M w/rflx Culture, Routine     Other   Morbid obesity (HCC)    Due to BMI >35 with GERD, arthritis, DM, HTN and HLD. Will work on diet and exercise with goal of losing 1-2lbs per week. Call with any concerns.       Relevant Medications   pioglitazone (ACTOS) 30 MG tablet   metFORMIN (GLUCOPHAGE) 500 MG tablet   empagliflozin (JARDIANCE) 10 MG TABS tablet   Hyperlipidemia    Under good control on current regimen. Continue current regimen. Continue to monitor. Call with any concerns. Refills given. Labs drawn today.       Relevant Medications   atorvastatin (LIPITOR) 20 MG tablet   amLODipine (NORVASC) 5 MG tablet   Other Relevant Orders   CBC with Differential/Platelet   Comprehensive  metabolic panel   Lipid Panel w/o Chol/HDL Ratio   TSH   UA/M w/rflx Culture, Routine   Vitamin D deficiency    Rechecking labs today. Await results. Call with any concerns. Treat as needed.       Relevant Orders   CBC with Differential/Platelet   Comprehensive metabolic panel   TSH   UA/M w/rflx Culture, Routine   VITAMIN D 25 Hydroxy (Vit-D Deficiency, Fractures)    Other Visit Diagnoses    Routine general medical examination at a health care facility    -  Primary   Vaccines up to date. Screening labs checked today. Pap N/A. Mammo and colonoscopy up to date. Continue diet and exercise. Call with any concerns.    Relevant Orders   Bayer DCA Hb A1c Waived   CBC with Differential/Platelet   Comprehensive metabolic panel   Lipid Panel w/o Chol/HDL Ratio   Microalbumin, Urine Waived   TSH   UA/M w/rflx Culture, Routine   Type 2 diabetes mellitus without complication, without long-term current use of insulin (HCC)       Relevant Medications   pioglitazone (ACTOS) 30 MG tablet   metFORMIN (GLUCOPHAGE) 500 MG tablet   empagliflozin (JARDIANCE) 10 MG TABS tablet   atorvastatin (LIPITOR) 20 MG tablet   Gastroesophageal reflux disease, esophagitis presence not specified       Relevant Medications   pantoprazole (PROTONIX) 40 MG tablet   Esophageal dysphagia       Relevant Medications   pantoprazole (PROTONIX) 40 MG tablet   Hx of upper respiratory infection       Man at work coughs a lot. She would like to be tested. Labs drawn today. Await results.    Relevant Orders   SAR CoV2 Serology (COVID 19)AB(IGG)IA       Follow up plan: Return in about 6 months (around 04/07/2019) for 6 month follow up.   LABORATORY TESTING:  - Pap smear: not applicable  IMMUNIZATIONS:   - Tdap: Tetanus vaccination status reviewed: last tetanus booster within 10 years. - Influenza: Postponed to flu season - Pneumovax: Up to date - Prevnar: Not applicable -  HPV: Not applicable - Zostavax  vaccine: Not applicable  SCREENING: -Mammogram: Up to date  - Colonoscopy: Up to date  - Bone Density: Not applicable   PATIENT COUNSELING:   Advised to take 1 mg of folate supplement per day if capable of pregnancy.   Sexuality: Discussed sexually transmitted diseases, partner selection, use of condoms, avoidance of unintended pregnancy  and contraceptive alternatives.   Advised to avoid cigarette smoking.  I discussed with the patient that most people either abstain from alcohol or drink within safe limits (<=14/week and <=4 drinks/occasion for males, <=7/weeks and <= 3 drinks/occasion for females) and that the risk for alcohol disorders and other health effects rises proportionally with the number of drinks per week and how often a drinker exceeds daily limits.  Discussed cessation/primary prevention of drug use and availability of treatment for abuse.   Diet: Encouraged to adjust caloric intake to maintain  or achieve ideal body weight, to reduce intake of dietary saturated fat and total fat, to limit sodium intake by avoiding high sodium foods and not adding table salt, and to maintain adequate dietary potassium and calcium preferably from fresh fruits, vegetables, and low-fat dairy products.    stressed the importance of regular exercise  Injury prevention: Discussed safety belts, safety helmets, smoke detector, smoking near bedding or upholstery.   Dental health: Discussed importance of regular tooth brushing, flossing, and dental visits.    NEXT PREVENTATIVE PHYSICAL DUE IN 1 YEAR. Return in about 6 months (around 04/07/2019) for 6 month follow up.

## 2018-10-07 LAB — COMPREHENSIVE METABOLIC PANEL
ALT: 12 IU/L (ref 0–32)
AST: 20 IU/L (ref 0–40)
Albumin/Globulin Ratio: 2.2 (ref 1.2–2.2)
Albumin: 4.7 g/dL (ref 3.8–4.9)
Alkaline Phosphatase: 64 IU/L (ref 39–117)
BUN/Creatinine Ratio: 8 — ABNORMAL LOW (ref 9–23)
BUN: 7 mg/dL (ref 6–24)
Bilirubin Total: 0.8 mg/dL (ref 0.0–1.2)
CO2: 24 mmol/L (ref 20–29)
Calcium: 10.1 mg/dL (ref 8.7–10.2)
Chloride: 103 mmol/L (ref 96–106)
Creatinine, Ser: 0.89 mg/dL (ref 0.57–1.00)
GFR calc Af Amer: 82 mL/min/{1.73_m2} (ref >59)
GFR calc non Af Amer: 71 mL/min/{1.73_m2} (ref >59)
Globulin, Total: 2.1 g/dL (ref 1.5–4.5)
Glucose: 110 mg/dL — ABNORMAL HIGH (ref 65–99)
Potassium: 4.2 mmol/L (ref 3.5–5.2)
Sodium: 143 mmol/L (ref 134–144)
Total Protein: 6.8 g/dL (ref 6.0–8.5)

## 2018-10-07 LAB — VITAMIN D 25 HYDROXY (VIT D DEFICIENCY, FRACTURES): Vit D, 25-Hydroxy: 58.9 ng/mL (ref 30.0–100.0)

## 2018-10-07 LAB — CBC WITH DIFFERENTIAL/PLATELET
Basophils Absolute: 0 10*3/uL (ref 0.0–0.2)
Basos: 1 %
EOS (ABSOLUTE): 0.1 10*3/uL (ref 0.0–0.4)
Eos: 1 %
Hematocrit: 39.1 % (ref 34.0–46.6)
Hemoglobin: 13.2 g/dL (ref 11.1–15.9)
Immature Grans (Abs): 0 10*3/uL (ref 0.0–0.1)
Immature Granulocytes: 0 %
Lymphocytes Absolute: 1.8 10*3/uL (ref 0.7–3.1)
Lymphs: 42 %
MCH: 32.4 pg (ref 26.6–33.0)
MCHC: 33.8 g/dL (ref 31.5–35.7)
MCV: 96 fL (ref 79–97)
Monocytes Absolute: 0.3 10*3/uL (ref 0.1–0.9)
Monocytes: 7 %
Neutrophils Absolute: 2.1 10*3/uL (ref 1.4–7.0)
Neutrophils: 49 %
Platelets: 231 10*3/uL (ref 150–450)
RBC: 4.08 x10E6/uL (ref 3.77–5.28)
RDW: 16.8 % — ABNORMAL HIGH (ref 11.7–15.4)
WBC: 4.2 10*3/uL (ref 3.4–10.8)

## 2018-10-07 LAB — LIPID PANEL W/O CHOL/HDL RATIO
Cholesterol, Total: 192 mg/dL (ref 100–199)
HDL: 122 mg/dL (ref >39)
LDL Calculated: 55 mg/dL (ref 0–99)
Triglycerides: 76 mg/dL (ref 0–149)
VLDL Cholesterol Cal: 15 mg/dL (ref 5–40)

## 2018-10-07 LAB — TSH: TSH: 0.82 u[IU]/mL (ref 0.450–4.500)

## 2018-10-07 LAB — SAR COV2 SEROLOGY (COVID19)AB(IGG),IA: SARS-CoV-2 Ab, IgG: NEGATIVE

## 2018-12-02 ENCOUNTER — Other Ambulatory Visit: Payer: Self-pay

## 2018-12-02 ENCOUNTER — Telehealth: Payer: Self-pay | Admitting: Family Medicine

## 2018-12-02 ENCOUNTER — Ambulatory Visit (INDEPENDENT_AMBULATORY_CARE_PROVIDER_SITE_OTHER): Payer: BLUE CROSS/BLUE SHIELD | Admitting: Family Medicine

## 2018-12-02 ENCOUNTER — Encounter: Payer: Self-pay | Admitting: Family Medicine

## 2018-12-02 VITALS — Ht 63.0 in | Wt 187.0 lb

## 2018-12-02 DIAGNOSIS — R197 Diarrhea, unspecified: Secondary | ICD-10-CM

## 2018-12-02 DIAGNOSIS — Z20822 Contact with and (suspected) exposure to covid-19: Secondary | ICD-10-CM

## 2018-12-02 DIAGNOSIS — R112 Nausea with vomiting, unspecified: Secondary | ICD-10-CM

## 2018-12-02 MED ORDER — PROMETHAZINE HCL 25 MG PO TABS: 25.0000 mg | ORAL_TABLET | Freq: Three times a day (TID) | ORAL | 0 refills | Status: DC | PRN

## 2018-12-02 MED ORDER — SUCRALFATE 1 GM/10ML PO SUSP: 1.0000 g | Freq: Three times a day (TID) | ORAL | 0 refills | Status: DC

## 2018-12-02 NOTE — Progress Notes (Signed)
Ht 5\' 3"  (1.6 m)   Wt 187 lb (84.8 kg)   BMI 33.13 kg/m    Subjective:    Patient ID: Lisa Galvan, female    DOB: 1961-03-01, 58 y.o.   MRN: [id number]  HPI: Lisa Galvan is a 58 y.o. female  Chief Complaint  Patient presents with  . Nausea    Ongoing appx 1 week. Not been able to eat.  . Emesis  . Diarrhea  . Abdominal Pain  . Shortness of Breath    . This visit was completed via WebEx due to the restrictions of the COVID-19 pandemic. All issues as above were discussed and addressed. Physical exam was done as above through visual confirmation on WebEx. If it was felt that the patient should be evaluated in the office, they were directed there. The patient verbally consented to this visit. . Location of the patient: home . Location of the provider: work . Those involved with this call:  . Provider: Roosvelt Maserachel , PA-C . CMA: Myrtha MantisKeri Bullock, CMA . Front Desk/Registration: Harriet PhoJoliza Johnson  . Time spent on call: 20 minutes with patient face to face via video conference. More than 50% of this time was spent in counseling and coordination of care. 5 minutes total spent in review of patient's record and preparation of their chart. I verified patient identity using two factors (patient name and date of birth). Patient consents verbally to being seen via telemedicine visit today.   N/V/D for about a week. States every time she eats she has significant watery diarrhea and abdominal cramping, also now throwing up nearly all foods she eats. Only thing she's been able to hold down is chicken noodle soup and water. No recent travel, known sick contacts, new food, abdominal pain, fevers, melena, hematemesis, urinary sxs. Tried pepto bismol and mylanta with no relief. Had some old carafate liquid around that she tried which may have helped some. 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   Relevant past medical, surgical, family and social history reviewed and updated as  indicated. Interim medical history since our last visit reviewed. Allergies and medications reviewed and updated.  Review of Systems  Per HPI unless specifically indicated above     Objective:    Ht 5\' 3"  (1.6 m)   Wt 187 lb (84.8 kg)   BMI 33.13 kg/m   Wt Readings from Last 3 Encounters:  12/02/18 187 lb (84.8 kg)  10/06/18 193 lb (87.5 kg)  06/26/18 194 lb 9.6 oz (88.3 kg)    Physical Exam Vitals signs and nursing note reviewed.  Constitutional:      General: She is not in acute distress.    Appearance: Normal appearance.  HENT:     Head: Atraumatic.     Right Ear: External ear normal.     Left Ear: External ear normal.     Nose: Nose normal. No congestion.     Mouth/Throat:     Mouth: Mucous membranes are moist.     Pharynx: Oropharynx is clear. No posterior oropharyngeal erythema.  Eyes:     Extraocular Movements: Extraocular movements intact.     Conjunctiva/sclera: Conjunctivae normal.  Neck:     Musculoskeletal: Normal range of motion.  Cardiovascular:     Comments: Unable to assess via virtual visit Pulmonary:     Effort: Pulmonary effort is normal. No respiratory distress.  Abdominal:     Comments: Abdomen nontender per patient's self exam  Musculoskeletal: Normal range of  motion.  Skin:    General: Skin is dry.     Findings: No erythema.  Neurological:     Mental Status: She is alert and oriented to person, place, and time.  Psychiatric:        Mood and Affect: Mood normal.        Thought Content: Thought content normal.        Judgment: Judgment normal.     Results for orders placed or performed in visit on 10/14/18  HM DIABETES EYE EXAM  Result Value Ref Range   HM Diabetic Eye Exam No Retinopathy No Retinopathy      Assessment & Plan:   Problem List Items Addressed This Visit    None    Visit Diagnoses    Nausea vomiting and diarrhea    -  Primary   Relevant Orders   Novel Coronavirus, NAA ([facility])     Will refer for COVID 19  testing and place on quarantine until results are back and sxs improved. Imodium, phenergan, and carafate prn as old bottle expired. Push fluids, BRAT diet. Strict return precautions given. If not improving over next 2 days should f/u for further testing/treatment.   Follow up plan: Return if symptoms worsen or fail to improve.

## 2018-12-02 NOTE — Telephone Encounter (Signed)
Not our patient

## 2018-12-02 NOTE — Telephone Encounter (Signed)
Medication: sucralfate (CARAFATE) 1 GM/10ML suspension     Patient states that in order for insurance to cover this medication, it needs to be resent in tablet form.

## 2018-12-03 ENCOUNTER — Other Ambulatory Visit: Payer: Self-pay | Admitting: Family Medicine

## 2018-12-03 LAB — NOVEL CORONAVIRUS, NAA: SARS-CoV-2, NAA: NOT DETECTED

## 2018-12-03 MED ORDER — SUCRALFATE 1 G PO TABS: 1.0000 g | ORAL_TABLET | Freq: Three times a day (TID) | ORAL | 0 refills | Status: DC | PRN

## 2018-12-03 NOTE — Telephone Encounter (Signed)
Pt called back in , pt advise of message, she found letter and on mychart and was ok with that, she expressed understanding

## 2018-12-03 NOTE — Telephone Encounter (Signed)
Called and left patient a VM asking for patient to please return my call. DPR says no detailed messages can be left.

## 2018-12-03 NOTE — Telephone Encounter (Signed)
Pt called to check on if the Pill form of sucralfate (CARAFATE) was going to be sent to the pharmacy. Pt's insurance will not cover the liquid/ Please advise   Pt also stated she was not able to see or find the letter for her employer on mychart/ please advise

## 2018-12-03 NOTE — Telephone Encounter (Signed)
Just sent over pill form, she can dissolve it in a glass of water or swallow it like a typical pill. The letter should be available through mychart from yesterday but we can mail it to her or fax it to her work if she would like

## 2018-12-10 ENCOUNTER — Ambulatory Visit: Payer: Self-pay | Admitting: *Deleted

## 2018-12-10 NOTE — Telephone Encounter (Signed)
Agree with UC

## 2018-12-10 NOTE — Telephone Encounter (Signed)
Pt transferred by nurse. Advised to pt that we don't have any appts available and that she would need to go to [facility] or [facility]. Pt verbalized understanding.

## 2018-12-10 NOTE — Telephone Encounter (Signed)
Pt called stating that she is having cheek and lip swelling which she noticed on 12/09/2018; she has taken 2 benadryl ( 50 mg) on 12/09/2018, and liquid 10 ml at 0700; she says that the swelling is getting worse; the pt started taking Carafate on 12/04/2018; she also takes amlodipine which is on her allergy list; recommendations made per nurse triage protocol; the pt does not want to go to the ED, and would like to be seen in the office instead; she sees Dr Park Liter, [facility]; pt transferred to [place] for final disposition. Reason for Disposition . Patient sounds very sick or weak to the triager  Answer Assessment - Initial Assessment Questions 1. ONSET: "When did the swelling start?" (e.g., minutes, hours, days)     12/09/2018 2. LOCATION: "What part of the face is swollen?"     Cheeks and lips 3. SEVERITY: "How swollen is it?"     severe 4. ITCHING: "Is there any itching?" If so, ask: "How much?"   (Scale 1-10; mild, moderate or severe)   no 5. PAIN: "Is the swelling painful to touch?" If so, ask: "How painful is it?"   (Scale 1-10; mild, moderate or severe)    Pain to touch around bottom gumline 6. FEVER: "Do you have a fever?" If so, ask: "What is it, how was it measured, and when did it start?"     no 7. CAUSE: "What do you think is causing the face swelling?"    Not sure 8. RECURRENT SYMPTOM: "Have you had face swelling before?" If so, ask: "When was the last time?" "What happened that time?"     Yes, when eating shellfish 9. OTHER SYMPTOMS: "Do you have any other symptoms?" (e.g., toothache, leg swelling)     Lip swelling 10. PREGNANCY: "Is there any chance you are pregnant?" "When was your last menstrual period?"       No, no  Protocols used: [facility]

## 2019-01-02 ENCOUNTER — Encounter: Payer: Self-pay | Admitting: Family Medicine

## 2019-01-02 ENCOUNTER — Ambulatory Visit: Payer: BLUE CROSS/BLUE SHIELD | Admitting: Family Medicine

## 2019-01-02 ENCOUNTER — Other Ambulatory Visit: Payer: Self-pay

## 2019-01-02 VITALS — BP 114/80 | HR 97 | Temp 98.6°F | Ht 63.0 in | Wt 180.0 lb

## 2019-01-02 DIAGNOSIS — B370 Candidal stomatitis: Secondary | ICD-10-CM | POA: Diagnosis not present

## 2019-01-02 DIAGNOSIS — Z23 Encounter for immunization: Secondary | ICD-10-CM | POA: Diagnosis not present

## 2019-01-02 MED ORDER — NYSTATIN 100000 UNIT/ML MT SUSP: 5.0000 mL | Freq: Four times a day (QID) | OROMUCOSAL | 0 refills | Status: DC

## 2019-01-02 MED ORDER — FLUCONAZOLE 150 MG PO TABS: 150.0000 mg | ORAL_TABLET | Freq: Once | ORAL | 0 refills | Status: AC

## 2019-01-02 MED ORDER — SUCRALFATE 1 G PO TABS: 1.0000 g | ORAL_TABLET | Freq: Three times a day (TID) | ORAL | 0 refills | Status: DC | PRN

## 2019-01-02 NOTE — Progress Notes (Signed)
BP 114/80   Pulse 97   Temp 98.6 F (37 C) (Oral)   Ht 5\' 3"  (1.6 m)   Wt 180 lb (81.6 kg)   SpO2 95%   BMI 31.89 kg/m    Subjective:    Patient ID: Lisa Galvan, female    DOB: June 01, 1960, 58 y.o.   MRN: [id number]  HPI: Lisa Galvan is a 58 y.o. female  Chief Complaint  Patient presents with  . Oral Pain    pt states that she feels mouth burning and cheassy. symptoms started about a week ago   1 week of mouth pain and burning, white coating in certain areas that she states feels "cheesy". Has been trying some gargles at home without relief. Denies new medications or changes to her routine, recent illness, fever, chills, lesions.   Relevant past medical, surgical, family and social history reviewed and updated as indicated. Interim medical history since our last visit reviewed. Allergies and medications reviewed and updated.  Review of Systems  Per HPI unless specifically indicated above     Objective:    BP 114/80   Pulse 97   Temp 98.6 F (37 C) (Oral)   Ht 5\' 3"  (1.6 m)   Wt 180 lb (81.6 kg)   SpO2 95%   BMI 31.89 kg/m   Wt Readings from Last 3 Encounters:  01/02/19 180 lb (81.6 kg)  12/02/18 187 lb (84.8 kg)  10/06/18 193 lb (87.5 kg)    Physical Exam Vitals signs and nursing note reviewed.  Constitutional:      Appearance: Normal appearance. She is not ill-appearing.  HENT:     Head: Atraumatic.     Mouth/Throat:     Comments: Tongue and roof of mouth mildly erythematous with thick white coating present in some patches Eyes:     Extraocular Movements: Extraocular movements intact.     Conjunctiva/sclera: Conjunctivae normal.  Neck:     Musculoskeletal: Normal range of motion and neck supple.  Cardiovascular:     Rate and Rhythm: Normal rate and regular rhythm.     Heart sounds: Normal heart sounds.  Pulmonary:     Effort: Pulmonary effort is normal.     Breath sounds: Normal breath sounds.  Musculoskeletal: Normal range of motion.  Skin:  General: Skin is warm and dry.  Neurological:     Mental Status: She is alert and oriented to person, place, and time.  Psychiatric:        Mood and Affect: Mood normal.        Thought Content: Thought content normal.        Judgment: Judgment normal.     Results for orders placed or performed in visit on 12/02/18  Novel Coronavirus, NAA ([facility])   Specimen: Oropharyngeal(OP) collection in vial transport medium   OROPHARYNGEA  TESTING  Result Value Ref Range   SARS-CoV-2, NAA Not Detected Not Detected      Assessment & Plan:   Problem List Items Addressed This Visit    None    Visit Diagnoses    Oral thrush    -  Primary   Tx with a diflucan tab and nystatin mouthwash. Can start probiotics or eat more yogurt for the next few weeks. F/u if not improving   Relevant Medications   nystatin (MYCOSTATIN) 100000 UNIT/ML suspension   Flu vaccine need       Relevant Orders   Flu Vaccine QUAD 36+ mos IM (Completed)       Follow  up plan: Return if symptoms worsen or fail to improve.

## 2019-01-05 ENCOUNTER — Other Ambulatory Visit: Payer: Self-pay

## 2019-01-05 NOTE — Telephone Encounter (Signed)
Patient is requesting a 90 day supply of this medication  °

## 2019-01-27 DIAGNOSIS — R208 Other disturbances of skin sensation: Secondary | ICD-10-CM | POA: Diagnosis not present

## 2019-01-27 DIAGNOSIS — R202 Paresthesia of skin: Secondary | ICD-10-CM | POA: Diagnosis not present

## 2019-01-27 DIAGNOSIS — E118 Type 2 diabetes mellitus with unspecified complications: Secondary | ICD-10-CM | POA: Diagnosis not present

## 2019-01-27 DIAGNOSIS — R200 Anesthesia of skin: Secondary | ICD-10-CM | POA: Diagnosis not present

## 2019-01-27 DIAGNOSIS — E538 Deficiency of other specified B group vitamins: Secondary | ICD-10-CM | POA: Diagnosis not present

## 2019-01-29 ENCOUNTER — Other Ambulatory Visit: Payer: Self-pay | Admitting: Neurology

## 2019-01-29 ENCOUNTER — Other Ambulatory Visit (HOSPITAL_COMMUNITY): Payer: Self-pay | Admitting: Neurology

## 2019-01-29 DIAGNOSIS — R200 Anesthesia of skin: Secondary | ICD-10-CM

## 2019-02-06 ENCOUNTER — Other Ambulatory Visit: Payer: Self-pay

## 2019-02-08 ENCOUNTER — Ambulatory Visit
Admission: RE | Admit: 2019-02-08 | Discharge: 2019-02-08 | Disposition: A | Discharge status: Home / Self Care | Payer: BLUE CROSS/BLUE SHIELD | Source: Ambulatory Visit | Attending: Neurology | Admitting: Neurology

## 2019-02-08 DIAGNOSIS — R200 Anesthesia of skin: Secondary | ICD-10-CM | POA: Diagnosis not present

## 2019-02-08 DIAGNOSIS — M5126 Other intervertebral disc displacement, lumbar region: Secondary | ICD-10-CM | POA: Diagnosis not present

## 2019-02-08 DIAGNOSIS — M546 Pain in thoracic spine: Secondary | ICD-10-CM | POA: Diagnosis not present

## 2019-02-08 DIAGNOSIS — M545 Low back pain: Secondary | ICD-10-CM | POA: Diagnosis not present

## 2019-02-09 ENCOUNTER — Other Ambulatory Visit: Payer: Self-pay | Admitting: Family Medicine

## 2019-02-09 MED ORDER — SUCRALFATE 1 G PO TABS: 1.0000 g | ORAL_TABLET | Freq: Three times a day (TID) | ORAL | 0 refills | Status: DC | PRN

## 2019-02-09 NOTE — Telephone Encounter (Signed)
Routing to provider  

## 2019-02-09 NOTE — Telephone Encounter (Signed)
Requested medication (s) are due for refill today: yes  Requested medication (s) are on the active medication list: yes  Last refill:  02/09/2019  Future visit scheduled: yes  Notes to clinic: requesting 90 day supply   Requested Prescriptions  Pending Prescriptions Disp Refills   sucralfate (CARAFATE) 1 g tablet [Pharmacy Med Name: SUCRALFATE 1GM TABLETS] 270 tablet     Sig: TAKE 1 TABLET(1 GRAM) BY MOUTH THREE TIMES DAILY AS NEEDED     Gastroenterology: Antiacids Passed - 02/09/2019 12:49 PM      Passed - Valid encounter within last 12 months    Recent Outpatient Visits          1 month ago Oral thrush   [facility] Merrie Roof [place], [place]   2 months ago Nausea vomiting and diarrhea   [place], [place], [place]   4 months ago Routine general medical examination at a health care facility   [place] [facility], [place] P, DO   6 months ago Allergic conjunctivitis of both eyes   [place], Megan P, DO   7 months ago Type 2 diabetes mellitus with diabetic neuropathy, without long-term current use of insulin [facility])   [place], [place], DO      Future Appointments            Tomorrow Vanga, Tally Due, MD [place]   In 2 months Wynetta Emery, Barb Merino, DO [facility], [place]

## 2019-02-10 ENCOUNTER — Other Ambulatory Visit: Payer: Self-pay

## 2019-02-10 ENCOUNTER — Ambulatory Visit: Payer: BLUE CROSS/BLUE SHIELD | Admitting: Gastroenterology

## 2019-02-10 ENCOUNTER — Encounter: Payer: Self-pay | Admitting: Gastroenterology

## 2019-02-10 VITALS — BP 114/81 | HR 101 | Temp 98.5°F | Resp 18 | Ht 63.0 in | Wt 172.0 lb

## 2019-02-10 DIAGNOSIS — K219 Gastro-esophageal reflux disease without esophagitis: Secondary | ICD-10-CM | POA: Diagnosis not present

## 2019-02-10 DIAGNOSIS — A048 Other specified bacterial intestinal infections: Secondary | ICD-10-CM | POA: Diagnosis not present

## 2019-02-10 DIAGNOSIS — R634 Abnormal weight loss: Secondary | ICD-10-CM

## 2019-02-10 DIAGNOSIS — Z1211 Encounter for screening for malignant neoplasm of colon: Secondary | ICD-10-CM

## 2019-02-10 DIAGNOSIS — R1310 Dysphagia, unspecified: Secondary | ICD-10-CM

## 2019-02-10 DIAGNOSIS — Z87898 Personal history of other specified conditions: Secondary | ICD-10-CM | POA: Diagnosis not present

## 2019-02-10 MED ORDER — OMEPRAZOLE 40 MG PO CPDR: 40.0000 mg | DELAYED_RELEASE_CAPSULE | Freq: Every day | ORAL | 1 refills | Status: DC

## 2019-02-10 NOTE — Progress Notes (Addendum)
Lisa Galvan Lisa Fassnacht, MD [address]  [address]  [place], [place] [address]  Main: [phone]  Fax: [phone]    Gastroenterology Consultation  Referring Provider:     Dorcas CarrowJohnson, Lisa P, DO Primary Care Physician:  Dorcas CarrowJohnson, Lisa P, DO Primary Gastroenterologist:  Dr. Arlyss Repressohini Galvan Lisa Galvan  Reason for Consultation:     GERD and H Pylori infection        HPI:   Lisa Galvan is a 58 y.o. female referred for consultation & management  by Dr. Laural Galvan, Lisa P, DO.  She has several years of heartburn, that has worsened over one year, describes it as pain in the chest radiating to right side as well as throat pain, worse laying flat. The pain is worse after eating or drinking something, denies any radiation to the back. She has previously tried Tums, Pepcid, she has been on Protonix for about 4 years was taking at 9 AM and 9 PM not associated with timing of food. She does not find protonix to be effective anymore. She was taking Nexium prior to protonix but changed when it became over-the-counter medication. She reports having sore taste in mouth, belching, feels like something is constantly moving down her throat. She had recent exacerbation and went to the emergency room last month. She has gained a few pounds in last few months. She does not drink sodas, no alcohol no smoking. She can tolerate tea, wine.  She goes to gym 3 times a week but she does consume high carb diet, likes to eat a lot of cheese.  Follow-up visit 05/13/2017: She underwent umbilical hernia repair and is doing well. Since last visit, patient underwent upper endoscopy which revealed H. pylori infection, treated with triple therapy. She denies having reflux symptoms on omeprazole 40 mg daily.  Follow up visit 08/05/17 H Pylori is confirmed eradicated based on H Pylori breath test. She is currently on protonix 40mg  BID for GERD and symptoms under control. She denies any symptoms today.   Follow-up visit 02/10/2019 She reports  for about 1 and half months, she was experiencing severe nausea, reflux, difficulty swallowing, pain during swallowing and nonbloody diarrhea.  And she lost about 20 pounds during that time due to poor Galvan.o. intake from above symptoms.  She is treated for oral thrush.  Her PCP started her on sucralfate, which she is not tolerating it well as they are very big.  She could not afford Carafate suspension.  She is currently taking Protonix 40 mg twice daily.  She also reports having experienced 4-5 episodes of nonbloody watery diarrhea for which she was recommended to take Imodium.  This has resulted in constipation.  She reports abdominal bloating.  She reports that her symptoms of diarrhea nausea resolved about a week ago and picked up 4 pounds since then.  She is trying to eat regular food, but continues to feel like food not going down the esophagus, eating small portions only at this time.  She reports that her her diarrhea has resolved.  Patient did not undergo any stool studies at that time.  Hemoglobin A1c 6.4, TSH normal.  Most recent labs from 09/2018 revealed normal CBC, CMP She denies consuming red meat, does not drink carbonated beverages Denies significant stress in her life She does not smoke or drink alcohol  GI Procedures: [place] ~[place], reportedly normal  EGD 03/12/17: - Normal duodenal bulb and second portion of the duodenum. - Normal stomach. Biopsied. - Normal gastroesophageal  junction and esophagus. DIAGNOSIS:  A. STOMACH; RANDOM COLD BIOPSY:  - HELICOBACTER PYLORI-ASSOCIATED GASTRITIS, WITH MILD CHRONIC ACTIVE  INFLAMMATION.  - NEGATIVE FOR INTESTINAL METAPLASIA, ATROPHY, DYSPLASIA, AND  MALIGNANCY.  - H. PYLORI BACTERIA ARE SEEN IN HEMATOXYLIN AND EOSIN SECTIONS.  Denies family history of esophageal cancer, stomach cancer, colon cancer  Past Medical History:  Diagnosis Date   Allergy    Arthritis    spine   Bilateral leg edema    Chest pain  12/15/2014   Chronic sciatica 12/10/2014   Diabetes mellitus without complication ([place])    Essential hypertension 12/10/2014   Essential hypertension 12/10/2014   Gastroesophageal reflux disease without esophagitis    GERD (gastroesophageal reflux disease)    Hyperlipidemia    Hypertension    Mixed hyperlipidemia    Obesity (BMI 30.0-34.9) 12/10/2014   Type 2 diabetes mellitus without complication ([place]) 7/74/1287    Past Surgical History:  Procedure Laterality Date   ABDOMINAL HYSTERECTOMY     ESOPHAGOGASTRODUODENOSCOPY (EGD) WITH PROPOFOL N/A 03/12/2017   Procedure: ESOPHAGOGASTRODUODENOSCOPY (EGD) WITH PROPOFOL;  Surgeon: Lin Landsman, MD;  Location: [facility] ENDOSCOPY;  Service: Gastroenterology;  Laterality: N/A;   HERNIA REPAIR     UMBILICAL HERNIA REPAIR N/A 05/01/2017   Procedure: HERNIA REPAIR UMBILICAL ADULT;  Surgeon: Vickie Epley, MD;  Location: [facility] ORS;  Service: General;  Laterality: N/A;     Current Outpatient Medications:    amLODipine (NORVASC) 5 MG tablet, TAKE 1 TABLET(5 MG) BY MOUTH DAILY, Disp: 30 tablet, Rfl: 6   atorvastatin (LIPITOR) 20 MG tablet, Take 1 tablet (20 mg total) by mouth daily., Disp: 30 tablet, Rfl: 6   Cholecalciferol (VITAMIN D-3) 5000 units TABS, Take 5,000 Units by mouth daily., Disp: , Rfl:    DULoxetine (CYMBALTA) 20 MG capsule, TAKE 1 CAPSULE(20 MG) BY MOUTH DAILY, Disp: 30 capsule, Rfl: 6   empagliflozin (JARDIANCE) 10 MG TABS tablet, Take 10 mg by mouth daily., Disp: 30 tablet, Rfl: 6   EPINEPHrine (EPIPEN 2-PAK) 0.3 mg/0.3 mL IJ SOAJ injection, Inject 0.3 mLs (0.3 mg total) into the muscle once. Take for severe allergic reaction, then come immediately to the Emergency Department or call 911., Disp: 1 Device, Rfl: 0   fexofenadine (ALLEGRA) 180 MG tablet, Take 180 mg by mouth daily., Disp: , Rfl:    gabapentin (NEURONTIN) 100 MG capsule, Take by mouth., Disp: , Rfl:    ibuprofen (ADVIL) 800 MG tablet, TAKE 1  TABLET(800 MG) BY MOUTH EVERY 8 HOURS AS NEEDED, Disp: 90 tablet, Rfl: 6   ketoconazole (NIZORAL) 2 % cream, Apply 1 application topically daily., Disp: 60 g, Rfl: 1   metFORMIN (GLUCOPHAGE) 500 MG tablet, Take 1 tablet (500 mg total) by mouth 2 (two) times daily with a meal., Disp: 60 tablet, Rfl: 6   nystatin (MYCOSTATIN) 100000 UNIT/ML suspension, Take 5 mLs (500,000 Units total) by mouth 4 (four) times daily., Disp: 60 mL, Rfl: 0   Olopatadine HCl 0.2 % SOLN, 1 drop to each eye daily, Disp: 2.5 mL, Rfl: 6   pantoprazole (PROTONIX) 40 MG tablet, TAKE 1 TABLET BY MOUTH TWICE DAILY, Disp: 60 tablet, Rfl: 6   pioglitazone (ACTOS) 30 MG tablet, TAKE 1 TABLET(30 MG) BY MOUTH DAILY, Disp: 30 tablet, Rfl: 6   valACYclovir (VALTREX) 500 MG tablet, Take 500 mg by mouth 2 (two) times daily as needed (for outbreak). , Disp: , Rfl:    montelukast (SINGULAIR) 10 MG tablet, Take 1 tablet (10 mg total) by mouth at  bedtime. (Patient not taking: Reported on 02/10/2019), Disp: 30 tablet, Rfl: 6   omeprazole (PRILOSEC) 40 MG capsule, Take 1 capsule (40 mg total) by mouth daily before breakfast., Disp: 30 capsule, Rfl: 1   sucralfate (CARAFATE) 1 g tablet, Take 1 tablet (1 g total) by mouth 3 (three) times daily as needed. (Patient not taking: Reported on 02/10/2019), Disp: 90 tablet, Rfl: 0   Family History  Problem Relation Age of Onset   Diabetes Mother    Cancer Mother        breast and stomach   Breast cancer Mother 38   Heart disease Father    Kidney disease Father        dialysis   Cancer Father        colon   Diabetes Father    Hypertension Sister    Hypertension Brother    Congestive Heart Failure Maternal Grandmother    Diabetes Maternal Grandmother    Hypertension Maternal Grandmother      Social History   Tobacco Use   Smoking status: Never Smoker   Smokeless tobacco: Never Used  Substance Use Topics   Alcohol use: Yes    Alcohol/week: 2.0 standard drinks     Types: 2 Glasses of wine per week    Comment: rare wine   Drug use: No    Allergies as of 02/10/2019 - Review Complete 02/10/2019  Allergen Reaction Noted   Amlodipine besy-benazepril hcl Anaphylaxis 07/04/2015   Ivp dye [iodinated diagnostic agents] Anaphylaxis 12/02/2018   Shellfish allergy Swelling and Anaphylaxis 12/15/2014   Shellfish-derived products Anaphylaxis 12/15/2014   Ace inhibitors Swelling 06/05/2016    Review of Systems:    All systems reviewed and negative except where noted in HPI.   Physical Exam:  BP 114/81 (BP Location: Left Arm, Patient Position: Sitting, Cuff Size: Large)    Pulse (!) 101    Temp 98.5 F (36.9 C)    Resp 18    Ht 5\' 3"  (1.6 m)    Wt 172 lb (78 kg)    BMI 30.47 kg/m  No LMP recorded. Patient has had a hysterectomy.  General:   Alert,  Well-developed, well-nourished, pleasant and cooperative in NAD Head:  Normocephalic and atraumatic. Eyes:  Sclera clear, no icterus.   Conjunctiva pink. Ears:  Normal auditory acuity. Nose:  No deformity, discharge, or lesions. Mouth:  No deformity or lesions,oropharynx pink & moist. Neck:  Supple; no masses or thyromegaly. Lungs:  Respirations even and unlabored.  Clear throughout to auscultation.   No wheezes, crackles, or rhonchi. No acute distress. Heart:  Regular rate and rhythm; no murmurs, clicks, rubs, or gallops. Abdomen:  Normal bowel sounds.  No bruits.  Soft, non-tender and mildly distended, tympanic without masses, hepatosplenomegaly or hernias noted.  No guarding or rebound tenderness.    Msk:  Symmetrical without gross deformities. Good, equal movement & strength bilaterally. Pulses:  Normal pulses noted. Extremities:  No clubbing or edema.  No cyanosis. Neurologic:  Alert and oriented x3;  grossly normal neurologically. Skin:  Intact without significant lesions or rashes. No jaundice. Psych:  Alert and cooperative. Normal mood and affect.  Imaging Studies: Mr Thoracic Spine Wo  Contrast  Result Date: 02/08/2019 CLINICAL DATA:  Low back pain. Bilateral lower extremity radicular pain extending to the feet. EXAM: MRI THORACIC AND LUMBAR SPINE WITHOUT CONTRAST TECHNIQUE: Multiplanar and multiecho pulse sequences of the thoracic and lumbar spine were obtained without intravenous contrast. COMPARISON:  MRI lumbar spine 04/10/2017 FINDINGS: MRI  THORACIC SPINE FINDINGS Alignment:  Anatomic Vertebrae: Marrow signal and vertebral body heights are normal. Cord:  Normal signal is present throughout the thoracic spinal cord. Paraspinal and other soft tissues: The paraspinous soft tissues are within normal limits. Visualized lung fields are clear. Disc levels: No significant focal disc protrusion is present in the thoracic spine. The central canal and foramina are patent throughout. MRI LUMBAR SPINE FINDINGS Segmentation: 5 non rib-bearing lumbar type vertebral bodies are present. The lowest fully formed vertebral body is L5. Alignment: Grade 1 anterolisthesis is present at L4-5 without significant interval change. No other significant listhesis is present. Vertebrae: A superior endplate fracture is present at L3. There is edema in the upper 2/3 of the vertebral body. Minimal loss of height is present. There is no retropulsed bone. Marrow signal and vertebral body heights are otherwise normal. Conus medullaris and cauda equina: Conus extends to the L1-2 level. Conus and cauda equina appear normal. Paraspinal and other soft tissues: Limited imaging the abdomen is unremarkable. There is no significant adenopathy. No solid organ lesions are present. Disc levels: L1-2: Negative. L2-3: Mild disc bulging and facet hypertrophy is present. There is no significant stenosis. L3-4: A broad-based disc protrusion has progressed some. Mild facet hypertrophy is noted bilaterally. No focal stenosis is present. L4-5: There is uncovering of a broad-based disc protrusion. Moderate facet hypertrophy has progressed. Mild  left sub articular and foraminal narrowing is present. L5-S1: Moderate facet hypertrophy has progressed. Mild right foraminal narrowing is worse than on the prior study. IMPRESSION: 1. Superior endplate compression fracture at L3 with minimal loss of height but no retropulsed bone or stenosis. 2. Mild left sub articular and foraminal narrowing at L4-5 is slightly worse than on the prior study. 3. Mild right foraminal narrowing at L5-S1 is worse than on the prior study. 4. Mild disc bulging and facet hypertrophy at L2-3 and L3-4 without significant stenosis. 5. Unremarkable MRI of the thoracic spine. Electronically Signed   By: Marin Roberts M.D.   On: 02/08/2019 22:52   Mr Lumbar Spine Wo Contrast  Result Date: 02/08/2019 CLINICAL DATA:  Low back pain. Bilateral lower extremity radicular pain extending to the feet. EXAM: MRI THORACIC AND LUMBAR SPINE WITHOUT CONTRAST TECHNIQUE: Multiplanar and multiecho pulse sequences of the thoracic and lumbar spine were obtained without intravenous contrast. COMPARISON:  MRI lumbar spine 04/10/2017 FINDINGS: MRI THORACIC SPINE FINDINGS Alignment:  Anatomic Vertebrae: Marrow signal and vertebral body heights are normal. Cord:  Normal signal is present throughout the thoracic spinal cord. Paraspinal and other soft tissues: The paraspinous soft tissues are within normal limits. Visualized lung fields are clear. Disc levels: No significant focal disc protrusion is present in the thoracic spine. The central canal and foramina are patent throughout. MRI LUMBAR SPINE FINDINGS Segmentation: 5 non rib-bearing lumbar type vertebral bodies are present. The lowest fully formed vertebral body is L5. Alignment: Grade 1 anterolisthesis is present at L4-5 without significant interval change. No other significant listhesis is present. Vertebrae: A superior endplate fracture is present at L3. There is edema in the upper 2/3 of the vertebral body. Minimal loss of height is present.  There is no retropulsed bone. Marrow signal and vertebral body heights are otherwise normal. Conus medullaris and cauda equina: Conus extends to the L1-2 level. Conus and cauda equina appear normal. Paraspinal and other soft tissues: Limited imaging the abdomen is unremarkable. There is no significant adenopathy. No solid organ lesions are present. Disc levels: L1-2: Negative. L2-3: Mild disc bulging  and facet hypertrophy is present. There is no significant stenosis. L3-4: A broad-based disc protrusion has progressed some. Mild facet hypertrophy is noted bilaterally. No focal stenosis is present. L4-5: There is uncovering of a broad-based disc protrusion. Moderate facet hypertrophy has progressed. Mild left sub articular and foraminal narrowing is present. L5-S1: Moderate facet hypertrophy has progressed. Mild right foraminal narrowing is worse than on the prior study. IMPRESSION: 1. Superior endplate compression fracture at L3 with minimal loss of height but no retropulsed bone or stenosis. 2. Mild left sub articular and foraminal narrowing at L4-5 is slightly worse than on the prior study. 3. Mild right foraminal narrowing at L5-S1 is worse than on the prior study. 4. Mild disc bulging and facet hypertrophy at L2-3 and L3-4 without significant stenosis. 5. Unremarkable MRI of the thoracic spine. Electronically Signed   By: Marin Roberts M.D.   On: 02/08/2019 22:52    Assessment and Plan:   Kymora Sciara is a 58 y.o. black  female with chronic GERD without esophagitis and H Pylori infection, Status post-triple therapy with confirmed eradication by breath test here for follow-up of recent episode of nausea, acute nonbloody diarrhea, 20 pound weight loss Her symptoms have currently resolved, regaining weight   GERD with dysphagia/odynophagia Discontinue sucralfate and Protonix Start omeprazole 40 mg daily before breakfast -EGD normal in 2018 Given her history of recent thrush, recommend EGD to  evaluate for esophageal candidiasis  History of H. pylori gastritis status post treatment Given her recent flareup of upper GI symptoms, recommend H. pylori breath test  Recent episode of nonbloody diarrhea Check CRP, fecal calprotectin levels  Colon cancer screening: Recommend colonoscopy  I have discussed alternative options, risks & benefits,  which include, but are not limited to, bleeding, infection, perforation,respiratory complication & drug reaction.  The patient agrees with this plan & written consent will be obtained.      Follow up in 4 to 6 weeks   Lisa Repress, MD

## 2019-02-11 LAB — C-REACTIVE PROTEIN: CRP: <1 mg/L (ref 0–10)

## 2019-02-12 LAB — H. PYLORI BREATH TEST: H pylori Breath Test: NEGATIVE

## 2019-03-02 DIAGNOSIS — Z79899 Other long term (current) drug therapy: Secondary | ICD-10-CM | POA: Diagnosis not present

## 2019-03-02 DIAGNOSIS — R200 Anesthesia of skin: Secondary | ICD-10-CM | POA: Diagnosis not present

## 2019-03-02 DIAGNOSIS — R202 Paresthesia of skin: Secondary | ICD-10-CM | POA: Diagnosis not present

## 2019-03-05 ENCOUNTER — Other Ambulatory Visit: Admission: RE | Admit: 2019-03-05 | Payer: BLUE CROSS/BLUE SHIELD | Source: Ambulatory Visit

## 2019-03-09 ENCOUNTER — Ambulatory Visit
Admission: RE | Admit: 2019-03-09 | Payer: BLUE CROSS/BLUE SHIELD | Source: Home / Self Care | Admitting: Gastroenterology

## 2019-03-09 ENCOUNTER — Encounter: Admission: RE | Payer: Self-pay | Source: Home / Self Care

## 2019-03-09 SURGERY — COLONOSCOPY WITH PROPOFOL
Anesthesia: General

## 2019-03-10 ENCOUNTER — Telehealth: Payer: Self-pay | Admitting: Gastroenterology

## 2019-03-10 ENCOUNTER — Other Ambulatory Visit: Payer: Self-pay | Admitting: Neurology

## 2019-03-10 DIAGNOSIS — M79604 Pain in right leg: Secondary | ICD-10-CM | POA: Insufficient documentation

## 2019-03-10 DIAGNOSIS — R937 Abnormal findings on diagnostic imaging of other parts of musculoskeletal system: Secondary | ICD-10-CM | POA: Insufficient documentation

## 2019-03-10 DIAGNOSIS — M899 Disorder of bone, unspecified: Secondary | ICD-10-CM | POA: Insufficient documentation

## 2019-03-10 DIAGNOSIS — G894 Chronic pain syndrome: Secondary | ICD-10-CM | POA: Insufficient documentation

## 2019-03-10 DIAGNOSIS — G8929 Other chronic pain: Secondary | ICD-10-CM | POA: Insufficient documentation

## 2019-03-10 DIAGNOSIS — Z789 Other specified health status: Secondary | ICD-10-CM | POA: Insufficient documentation

## 2019-03-10 DIAGNOSIS — Z79899 Other long term (current) drug therapy: Secondary | ICD-10-CM | POA: Insufficient documentation

## 2019-03-10 DIAGNOSIS — R200 Anesthesia of skin: Secondary | ICD-10-CM

## 2019-03-10 DIAGNOSIS — R202 Paresthesia of skin: Secondary | ICD-10-CM

## 2019-03-10 NOTE — Telephone Encounter (Signed)
Pt left vm to speak to [facility]

## 2019-03-10 NOTE — Progress Notes (Addendum)
Patient's Name: Lisa Galvan  MRN: [id number]  Referring Provider: Vertell Limber  DOB: 04-18-1961  PCP: [name], DO  DOS: 03/11/2019  Note by: Gaspar Cola, MD  Service setting: Ambulatory outpatient  Specialty: Interventional Pain Management  Location: [facility] (AMB) Pain Management Facility  Visit type: Initial Patient Evaluation  Patient type: New Patient   Primary Reason(s) for Visit: Encounter for initial evaluation of one or more chronic problems (new to examiner) potentially causing chronic pain, and posing a threat to normal musculoskeletal function. (Level of risk: High) CC: Leg Pain (bilateral) and Foot Pain (bilateral)  HPI  Ms. Grunwald is a 58 y.o. year old, female patient, who comes today to see [place] for the first time for an initial evaluation of her chronic pain. She has Morbid obesity ([place]); Type 2 diabetes mellitus with diabetic neuropathy, unspecified ([place]); Hyperlipidemia; Gastroesophageal reflux disease without esophagitis; Vitamin D deficiency; DDD (degenerative disc disease), lumbar; Lumbar facet syndrome; Sacroiliac joint dysfunction; Neuropathy due to secondary diabetes ([place]); Bilateral leg edema; Mastalgia in female; Helicobacter pylori infection; Cholelithiasis; Fatty liver; Essential hypertension; Chronic pain syndrome; Pharmacologic therapy; Disorder of skeletal system; Problems influencing health status; Chronic low back pain (Third area of Pain) (Bilateral) w/o sciatica; Chronic lower extremity pain (Secondary area of Pain) (Bilateral); Abnormal MRI, lumbar spine (02/08/2019); Grade 1 Anterolisthesis of L4/5; Chronic hip pain (Bilateral) (R>L); Chronic sacroiliac joint pain (Bilateral); Sacroiliac joint somatic dysfunction (Bilateral); Chronic feet pain (Primary Area of Pain) (Bilateral); Chronic leg and foot pain (Bilateral); Diabetic peripheral neuropathy ([place]); Chronic neuropathic pain; Neurogenic pain; Chronic musculoskeletal pain; and Osteoarthritis  involving multiple joints on their problem list. Today she comes in for evaluation of her Leg Pain (bilateral) and Foot Pain (bilateral)  Pain Assessment: Location: Right, Left Leg Radiating: denies Onset: More than a month ago Duration: Chronic pain Quality: Numbness, Burning, Sharp, Shooting, Stabbing, Discomfort Severity: 5 /10 (subjective, self-reported pain score)  Note: Reported level is compatible with observation.                         When using our objective Pain Scale, levels between 6 and 10/10 are said to belong in an emergency room, as it progressively worsens from a 6/10, described as severely limiting, requiring emergency care not usually available at an outpatient pain management facility. At a 6/10 level, communication becomes difficult and requires great effort. Assistance to reach the emergency department may be required. Facial flushing and profuse sweating along with potentially dangerous increases in heart rate and blood pressure will be evident. Effect on ADL: "I cant stand up long" Timing: Constant Modifying factors: denies BP: (!) 129/95  HR: 86  Onset and Duration: Gradual Cause of pain: Unknown Severity: NAS-11 at its worse: 10/10, NAS-11 at its best: 2/10, NAS-11 now: 5/10 and NAS-11 on the average: 5/10 Timing: Morning, Night and After a period of immobility Aggravating Factors: Bending, Climbing, Kneeling, Squatting, Stooping  and Walking Alleviating Factors: Sitting Associated Problems: Numbness, Spasms, Temperature changes, Tingling, Pain that wakes patient up and Pain that does not allow patient to sleep Quality of Pain: Aching, Annoying, Burning, Constant, Exhausting, Feeling of weight, Getting longer, Shooting, Stabbing, Tender, Tingling and Uncomfortable Previous Examinations or Tests: MRI scan, Nerve conduction test and Neurological evaluation Previous Treatments: The patient denies patient has had injections in the past  The patient comes into the  clinics today for the first time for a chronic pain management evaluation.  According to the  patient her primary pain is that of the lower extremities, bilaterally, with both of them being equally as bad.  The pain is primarily in both of her feet and it feels like a burning sensation.  The patient does have a history of diabetes and her last documented hemoglobin A1c was 7.4.  However, she indicates that the last one that she had done was 6.4, but there is no documentation about that.  The patient describes having treated this burning sensation effectively with gabapentin 600 mg p.o. 3 times daily, but she experienced some hair loss and had to come off of that.  Currently she is taking duloxetine 60 mg p.o. twice daily + gabapentin 300 mg p.o. 3 times daily.  She refers that most of those medications usually work well at the beginning, but then they wore off.  Specifically she indicates that the medicine does not seem to last 8 hours after she takes it.  In the past, she indicates having had some of this pain treated by a pain specialist (interventional pain management specialist) find an name of Dr. Darrol Jump. Dr. Darrol Jump, [address] [place] [address]. Tel.: [phone] Fax.: [phone].  She refers having had some nerve blocks done that usually will last for about a year.  Her condition has been slowly getting worse and currently she is also experiencing bilateral low back pain with the left being worse than the right.  Physical exam today was positive for bilateral lumbar facet pain, bilateral sacroiliac joint pain, and bilateral hip joint pain.  She also presents with decreased range of motion on hyperextension and rotation as well as decreased range of motion of her hip joints, with the right side being worse than the left.  The physical exam today was negative for any active radiculopathies.  She was able to toe walk and heel walk without any problems.  She indicates  having moved to the [place] area approximately 3 years ago.  She indicates having done some physical therapy in the past, but it was more than 2 years ago.  In addition, she indicates having some problems walking due to lack of perception in her feet.  We may need to consider EMG/PNCV if these are not available.  Today I took the time to provide the patient with information regarding my pain practice. The patient was informed that my practice is divided into two sections: an interventional pain management section, as well as a completely separate and distinct medication management section. I explained that I have procedure days for my interventional therapies, and evaluation days for follow-ups and medication management. Because of the amount of documentation required during both, they are kept separated. This means that there is the possibility that she may be scheduled for a procedure on one day, and medication management the next. I have also informed her that because of staffing and facility limitations, I no longer take patients for medication management only. To illustrate the reasons for this, I gave the patient the example of surgeons, and how inappropriate it would be to refer a patient to his/her care, just to write for the post-surgical antibiotics on a surgery done by a different surgeon.   Because interventional pain management is my board-certified specialty, the patient was informed that joining my practice means that they are open to any and all interventional therapies. I made it clear that this does not mean that they will be forced to have any  procedures done. What this means is that I believe interventional therapies to be essential part of the diagnosis and proper management of chronic pain conditions. Therefore, patients not interested in these interventional alternatives will be better served under the care of a different practitioner.  The patient was also made aware of my  Comprehensive Pain Management Safety Guidelines where by joining my practice, they limit all of their nerve blocks and joint injections to those done by our practice, for as long as we are retained to manage their care.   Historic Controlled Substance Pharmacotherapy Review  PMP and historical list of controlled substances: Patient recently moved to [facility] from [place], [place] MME/day: 0 mg/day  Medications: The patient did not bring the medication(s) to the appointment, as requested in our "New Patient Package" Pharmacodynamics: Desired effects: Analgesia: The patient reports >50% benefit. Reported improvement in function: The patient reports medication allows her to accomplish basic ADLs. Clinically meaningful improvement in function (CMIF): Sustained CMIF goals met Perceived effectiveness: Described as relatively effective, allowing for increase in activities of daily living (ADL) Undesirable effects: Side-effects or Adverse reactions: None reported Historical Monitoring: The patient  reports no history of drug use. List of all UDS Test(s): No results found . List of other Serum/Urine Drug Screening Test(s):  No results found. Historical Background Evaluation: [place] PMP: PDMP reviewed during this encounter. Six (6) year initial data search conducted.             PMP NARX Score Report:  Narcotic: 000 Sedative: 000 Stimulant: 000 [place] Department of public safety, offender search: Editor, commissioning Information) Non-contributory Risk Assessment Profile: Aberrant behavior: None observed or detected today Risk factors for fatal opioid overdose: None identified today PMP NARX Overdose Risk Score: 000 Fatal overdose hazard ratio (HR): Calculation deferred Non-fatal overdose hazard ratio (HR): Calculation deferred Risk of opioid abuse or dependence: 0.7-3.0% with doses ? 36 MME/day and 6.1-26% with doses ? 120 MME/day. Substance use disorder (SUD) risk level: See below Personal History of  Substance Abuse (SUD-Substance use disorder):  Alcohol: Negative  Illegal Drugs: Negative  Rx Drugs: Negative  ORT Risk Level calculation: Low Risk Opioid Risk Tool - 03/11/19 0910      Family History of Substance Abuse   Alcohol  Negative    Illegal Drugs  Negative    Rx Drugs  Negative      Personal History of Substance Abuse   Alcohol  Negative    Illegal Drugs  Negative    Rx Drugs  Negative      Age   Age between 60-45 years   No      History of Preadolescent Sexual Abuse   History of Preadolescent Sexual Abuse  Negative or Female      Psychological Disease   Psychological Disease  Negative    Depression  Negative      Total Score   Opioid Risk Tool Scoring  0    Opioid Risk Interpretation  Low Risk      ORT Scoring interpretation table:  Score <3 = Low Risk for SUD  Score between 4-7 = Moderate Risk for SUD  Score >8 = High Risk for Opioid Abuse   PHQ-2 Depression Scale:  Total score: 0  PHQ-2 Scoring interpretation table: (Score and probability of major depressive disorder)  Score 0 = No depression  Score 1 = 15.4% Probability  Score 2 = 21.1% Probability  Score 3 = 38.4% Probability  Score 4 = 45.5% Probability  Score 5 =  56.4% Probability  Score 6 = 78.6% Probability   PHQ-9 Depression Scale:  Total score: 0  PHQ-9 Scoring interpretation table:  Score 0-4 = No depression  Score 5-9 = Mild depression  Score 10-14 = Moderate depression  Score 15-19 = Moderately severe depression  Score 20-27 = Severe depression (2.4 times higher risk of SUD and 2.89 times higher risk of overuse)   Pharmacologic Plan: As per protocol, I have not taken over any controlled substance management, pending the results of ordered tests and/or consults.            Initial impression: Pending review of available data and ordered tests.  Meds   Current Outpatient Medications:  .  amLODipine (NORVASC) 5 MG tablet, TAKE 1 TABLET(5 MG) BY MOUTH DAILY, Disp: 30 tablet, Rfl: 6 .   atorvastatin (LIPITOR) 20 MG tablet, Take 1 tablet (20 mg total) by mouth daily., Disp: 30 tablet, Rfl: 6 .  Cholecalciferol (VITAMIN D-3) 5000 units TABS, Take 5,000 Units by mouth daily., Disp: , Rfl:  .  DULoxetine (CYMBALTA) 60 MG capsule, Take 60 mg by mouth daily., Disp: , Rfl:  .  empagliflozin (JARDIANCE) 10 MG TABS tablet, Take 10 mg by mouth daily., Disp: 30 tablet, Rfl: 6 .  EPINEPHrine (EPIPEN 2-PAK) 0.3 mg/0.3 mL IJ SOAJ injection, Inject 0.3 mLs (0.3 mg total) into the muscle once. Take for severe allergic reaction, then come immediately to the Emergency Department or call 911., Disp: 1 Device, Rfl: 0 .  fexofenadine (ALLEGRA) 180 MG tablet, Take 180 mg by mouth daily., Disp: , Rfl:  .  ibuprofen (ADVIL) 800 MG tablet, TAKE 1 TABLET(800 MG) BY MOUTH EVERY 8 HOURS AS NEEDED, Disp: 90 tablet, Rfl: 6 .  ketoconazole (NIZORAL) 2 % cream, Apply 1 application topically daily., Disp: 60 g, Rfl: 1 .  metFORMIN (GLUCOPHAGE) 500 MG tablet, Take 1 tablet (500 mg total) by mouth 2 (two) times daily with a meal., Disp: 60 tablet, Rfl: 6 .  montelukast (SINGULAIR) 10 MG tablet, Take 1 tablet (10 mg total) by mouth at bedtime., Disp: 30 tablet, Rfl: 6 .  omeprazole (PRILOSEC) 40 MG capsule, Take 1 capsule (40 mg total) by mouth daily before breakfast., Disp: 30 capsule, Rfl: 1 .  pantoprazole (PROTONIX) 40 MG tablet, TAKE 1 TABLET BY MOUTH TWICE DAILY, Disp: 60 tablet, Rfl: 6 .  pioglitazone (ACTOS) 30 MG tablet, TAKE 1 TABLET(30 MG) BY MOUTH DAILY, Disp: 30 tablet, Rfl: 6 .  valACYclovir (VALTREX) 500 MG tablet, Take 500 mg by mouth 2 (two) times daily as needed (for outbreak). , Disp: , Rfl:  .  nystatin (MYCOSTATIN) 100000 UNIT/ML suspension, Take 5 mLs (500,000 Units total) by mouth 4 (four) times daily., Disp: 60 mL, Rfl: 0 .  Olopatadine HCl 0.2 % SOLN, 1 drop to each eye daily, Disp: 2.5 mL, Rfl: 6 .  pregabalin (LYRICA) 25 MG capsule, Take 1 capsule (25 mg total) by mouth 3 (three) times  daily., Disp: 90 capsule, Rfl: 0  Imaging Review  Thoracic Imaging: Thoracic MR wo contrast:  Results for orders placed during the hospital encounter of 02/08/19  MR THORACIC SPINE WO CONTRAST   Narrative CLINICAL DATA:  Low back pain. Bilateral lower extremity radicular pain extending to the feet.  EXAM: MRI THORACIC AND LUMBAR SPINE WITHOUT CONTRAST  TECHNIQUE: Multiplanar and multiecho pulse sequences of the thoracic and lumbar spine were obtained without intravenous contrast.  COMPARISON:  MRI lumbar spine 04/10/2017  FINDINGS: MRI THORACIC SPINE FINDINGS  Alignment:  Anatomic  Vertebrae: Marrow signal and vertebral body heights are normal.  Cord:  Normal signal is present throughout the thoracic spinal cord.  Paraspinal and other soft tissues: The paraspinous soft tissues are within normal limits. Visualized lung fields are clear.  Disc levels:  No significant focal disc protrusion is present in the thoracic spine. The central canal and foramina are patent throughout.  MRI LUMBAR SPINE FINDINGS  Segmentation: 5 non rib-bearing lumbar type vertebral bodies are present. The lowest fully formed vertebral body is L5.  Alignment: Grade 1 anterolisthesis is present at L4-5 without significant interval change. No other significant listhesis is present.  Vertebrae: A superior endplate fracture is present at L3. There is edema in the upper 2/3 of the vertebral body. Minimal loss of height is present. There is no retropulsed bone. Marrow signal and vertebral body heights are otherwise normal.  Conus medullaris and cauda equina: Conus extends to the L1-2 level. Conus and cauda equina appear normal.  Paraspinal and other soft tissues: Limited imaging the abdomen is unremarkable. There is no significant adenopathy. No solid organ lesions are present.  Disc levels:  L1-2: Negative.  L2-3: Mild disc bulging and facet hypertrophy is present. There is no significant  stenosis.  L3-4: A broad-based disc protrusion has progressed some. Mild facet hypertrophy is noted bilaterally. No focal stenosis is present.  L4-5: There is uncovering of a broad-based disc protrusion. Moderate facet hypertrophy has progressed. Mild left sub articular and foraminal narrowing is present.  L5-S1: Moderate facet hypertrophy has progressed. Mild right foraminal narrowing is worse than on the prior study.  IMPRESSION: 1. Superior endplate compression fracture at L3 with minimal loss of height but no retropulsed bone or stenosis. 2. Mild left sub articular and foraminal narrowing at L4-5 is slightly worse than on the prior study. 3. Mild right foraminal narrowing at L5-S1 is worse than on the prior study. 4. Mild disc bulging and facet hypertrophy at L2-3 and L3-4 without significant stenosis. 5. Unremarkable MRI of the thoracic spine.   Electronically Signed   By: San Morelle M.D.   On: 02/08/2019 22:52    Lumbosacral Imaging: Lumbar MR wo contrast:  Results for orders placed during the hospital encounter of 02/08/19  MR LUMBAR SPINE WO CONTRAST   Narrative CLINICAL DATA:  Low back pain. Bilateral lower extremity radicular pain extending to the feet.  EXAM: MRI THORACIC AND LUMBAR SPINE WITHOUT CONTRAST  TECHNIQUE: Multiplanar and multiecho pulse sequences of the thoracic and lumbar spine were obtained without intravenous contrast.  COMPARISON:  MRI lumbar spine 04/10/2017  FINDINGS: MRI THORACIC SPINE FINDINGS  Alignment:  Anatomic  Vertebrae: Marrow signal and vertebral body heights are normal.  Cord:  Normal signal is present throughout the thoracic spinal cord.  Paraspinal and other soft tissues: The paraspinous soft tissues are within normal limits. Visualized lung fields are clear.  Disc levels:  No significant focal disc protrusion is present in the thoracic spine. The central canal and foramina are patent throughout.  MRI  LUMBAR SPINE FINDINGS  Segmentation: 5 non rib-bearing lumbar type vertebral bodies are present. The lowest fully formed vertebral body is L5.  Alignment: Grade 1 anterolisthesis is present at L4-5 without significant interval change. No other significant listhesis is present.  Vertebrae: A superior endplate fracture is present at L3. There is edema in the upper 2/3 of the vertebral body. Minimal loss of height is present. There is no retropulsed bone. Marrow signal and vertebral body heights are  otherwise normal.  Conus medullaris and cauda equina: Conus extends to the L1-2 level. Conus and cauda equina appear normal.  Paraspinal and other soft tissues: Limited imaging the abdomen is unremarkable. There is no significant adenopathy. No solid organ lesions are present.  Disc levels:  L1-2: Negative.  L2-3: Mild disc bulging and facet hypertrophy is present. There is no significant stenosis.  L3-4: A broad-based disc protrusion has progressed some. Mild facet hypertrophy is noted bilaterally. No focal stenosis is present.  L4-5: There is uncovering of a broad-based disc protrusion. Moderate facet hypertrophy has progressed. Mild left sub articular and foraminal narrowing is present.  L5-S1: Moderate facet hypertrophy has progressed. Mild right foraminal narrowing is worse than on the prior study.  IMPRESSION: 1. Superior endplate compression fracture at L3 with minimal loss of height but no retropulsed bone or stenosis. 2. Mild left sub articular and foraminal narrowing at L4-5 is slightly worse than on the prior study. 3. Mild right foraminal narrowing at L5-S1 is worse than on the prior study. 4. Mild disc bulging and facet hypertrophy at L2-3 and L3-4 without significant stenosis. 5. Unremarkable MRI of the thoracic spine.   Electronically Signed   By: San Morelle M.D.   On: 02/08/2019 22:52    Lumbar DG (Complete) 4+V:  Results for orders placed  during the hospital encounter of 03/04/17  DG Lumbar Spine Complete   Narrative CLINICAL DATA:  58 year old female with bilateral leg pain. No reported injury. Initial encounter.  EXAM: LUMBAR SPINE - COMPLETE 4+ VIEW  COMPARISON:  None.  FINDINGS: 3 mm anterior slip L4 secondary to facet degenerative changes. Mild L4-5 disc space narrowing.  L5-S1 facet degenerative changes greater on the right.  Calcified uterine fibroid suspected.  IMPRESSION: 3 mm anterior slip L4 secondary to facet degenerative changes. Mild L4-5 disc space narrowing.  L5-S1 facet degenerative changes greater on the right.   Electronically Signed   By: Genia Del M.D.   On: 03/04/2017 12:01          Complexity Note: Imaging results reviewed. Results shared with Ms. Kalan, using [employer].                         ROS  Cardiovascular: No reported cardiovascular signs or symptoms such as High blood pressure, coronary artery disease, abnormal heart rate or rhythm, heart attack, blood thinner therapy or heart weakness and/or failure Pulmonary or Respiratory: No reported pulmonary signs or symptoms such as wheezing and difficulty taking a deep full breath (Asthma), difficulty blowing air out (Emphysema), coughing up mucus (Bronchitis), persistent dry cough, or temporary stoppage of breathing during sleep Neurological: Abnormal skin sensations (Peripheral Neuropathy) Review of Past Neurological Studies:  Results for orders placed or performed during the hospital encounter of 03/10/18  CT HEAD WO CONTRAST   Narrative   CLINICAL DATA:  58 year old female post fall 5 months ago hitting back of head. Headaches since. Unsure of loss of consciousness. Initial encounter.  EXAM: CT HEAD WITHOUT CONTRAST  TECHNIQUE: Contiguous axial images were obtained from the base of the skull through the vertex without intravenous contrast.  COMPARISON:  None.  FINDINGS: Brain: No intracranial hemorrhage or CT  evidence of large acute infarct. No intracranial mass lesion noted on this unenhanced exam. Partially empty non expanded sella. Cervicomedullary junction and pituitary region unremarkable.  Vascular: No hyperdense vessel.  Skull: No skull fracture.  Sinuses/Orbits: Exophthalmos.  Visualized sinuses are clear.  Other: Mastoid air  cells and middle ear cavities are clear.  IMPRESSION: 1. No acute intracranial abnormality noted. 2. Exophthalmos.   Electronically Signed   By: Genia Del M.D.   On: 03/11/2018 06:16    Psychological-Psychiatric: No reported psychological or psychiatric signs or symptoms such as difficulty sleeping, anxiety, depression, delusions or hallucinations (schizophrenial), mood swings (bipolar disorders) or suicidal ideations or attempts Gastrointestinal: Heartburn due to stomach pushing into lungs (Hiatal hernia) Genitourinary: No reported renal or genitourinary signs or symptoms such as difficulty voiding or producing urine, peeing blood, non-functioning kidney, kidney stones, difficulty emptying the bladder, difficulty controlling the flow of urine, or chronic kidney disease Hematological: No reported hematological signs or symptoms such as prolonged bleeding, low or poor functioning platelets, bruising or bleeding easily, hereditary bleeding problems, low energy levels due to low hemoglobin or being anemic Endocrine: High blood sugar controlled without the use of insulin (NIDDM) Rheumatologic: No reported rheumatological signs and symptoms such as fatigue, joint pain, tenderness, swelling, redness, heat, stiffness, decreased range of motion, with or without associated rash Musculoskeletal: Negative for myasthenia gravis, muscular dystrophy, multiple sclerosis or malignant hyperthermia Work History: Retired  Allergies  Ms. Cirrincione is allergic to amlodipine besy-benazepril hcl; ivp dye [iodinated diagnostic agents]; shellfish allergy; shellfish-derived products;  and ace inhibitors.  Laboratory Chemistry Profile   Screening Lab Results  Component Value Date   SARSCOV2NAA Not Detected 12/02/2018   HIV Non Reactive 07/03/2017    Inflammation (CRP: Acute Phase) (ESR: Chronic Phase) Lab Results  Component Value Date   CRP <1 02/10/2019   ESRSEDRATE 10 02/27/2018                         Rheumatology No results found.  Renal Lab Results  Component Value Date   BUN 7 10/06/2018   CREATININE 0.89 10/06/2018   BCR 8 (L) 10/06/2018   GFRAA 82 10/06/2018   GFRNONAA 71 10/06/2018                             Hepatic Lab Results  Component Value Date   AST 20 10/06/2018   ALT 12 10/06/2018   ALBUMIN 4.7 10/06/2018   ALKPHOS 64 10/06/2018                        Electrolytes Lab Results  Component Value Date   NA 143 10/06/2018   K 4.2 10/06/2018   CL 103 10/06/2018   CALCIUM 10.1 10/06/2018   PHOS 4.7 (H) 07/03/2017                        Neuropathy Lab Results  Component Value Date   HGBA1C 6.4 10/06/2018   HIV Non Reactive 07/03/2017                        CNS No results found.  Bone Lab Results  Component Value Date   VD25OH 58.9 10/06/2018                         Coagulation Lab Results  Component Value Date   PLT 231 10/06/2018                        Cardiovascular Lab Results  Component Value Date   TROPONINI <0.03 11/08/2016   HGB 13.2 10/06/2018  HCT 39.1 10/06/2018                         ID Lab Results  Component Value Date   HIV Non Reactive 07/03/2017   SARSCOV2NAA Not Detected 12/02/2018    Cancer No results found.  Endocrine Lab Results  Component Value Date   TSH 0.820 10/06/2018                        Note: Lab results reviewed.  [place]  Drug: Ms. Koestner  reports no history of drug use. Alcohol:  reports current alcohol use of about 2.0 standard drinks of alcohol per week. Tobacco:  reports that she has never smoked. She has never used smokeless tobacco. Medical:  has a past  medical history of Allergy, Arthritis, Bilateral leg edema, Chest pain (12/15/2014), Chronic sciatica (12/10/2014), Chronic sciatica (12/10/2014), Diabetes mellitus without complication ([place]), Essential hypertension (12/10/2014), Essential hypertension (12/10/2014), Gastroesophageal reflux disease without esophagitis, GERD (gastroesophageal reflux disease), Hyperlipidemia, Hypertension, Mixed hyperlipidemia, Obesity (BMI 30.0-34.9) (12/10/2014), and Type 2 diabetes mellitus without complication ([place]) (01/07/9149). Family: family history includes Breast cancer (age of onset: 87) in her mother; Cancer in her father and mother; Congestive Heart Failure in her maternal grandmother; Diabetes in her father, maternal grandmother, and mother; Heart disease in her father; Hypertension in her brother, maternal grandmother, and sister; Kidney disease in her father.  Past Surgical History:  Procedure Laterality Date  . ABDOMINAL HYSTERECTOMY    . ESOPHAGOGASTRODUODENOSCOPY (EGD) WITH PROPOFOL N/A 03/12/2017   Procedure: ESOPHAGOGASTRODUODENOSCOPY (EGD) WITH PROPOFOL;  Surgeon: Lin Landsman, MD;  Location: [place];  Service: Gastroenterology;  Laterality: N/A;  . HERNIA REPAIR    . UMBILICAL HERNIA REPAIR N/A 05/01/2017   Procedure: HERNIA REPAIR UMBILICAL ADULT;  Surgeon: Vickie Epley, MD;  Location: [facility];  Service: General;  Laterality: N/A;   Active Ambulatory Problems    Diagnosis Date Noted  . Morbid obesity ([place]) 12/10/2014  . Type 2 diabetes mellitus with diabetic neuropathy, unspecified ([place]) 12/10/2014  . Hyperlipidemia 12/10/2014  . Gastroesophageal reflux disease without esophagitis 12/10/2014  . Vitamin D deficiency 12/13/2014  . DDD (degenerative disc disease), lumbar 12/16/2014  . Lumbar facet syndrome 12/16/2014  . Sacroiliac joint dysfunction 12/16/2014  . Neuropathy due to secondary diabetes ([place]) 12/16/2014  . Bilateral leg edema 07/02/2016  . Mastalgia in female 04/09/2017   . Helicobacter pylori infection 05/13/2017  . Cholelithiasis 06/23/2018  . Fatty liver 06/23/2018  . Essential hypertension 10/06/2018  . Chronic pain syndrome 03/10/2019  . Pharmacologic therapy 03/10/2019  . Disorder of skeletal system 03/10/2019  . Problems influencing health status 03/10/2019  . Chronic low back pain (Third area of Pain) (Bilateral) w/o sciatica 03/10/2019  . Chronic lower extremity pain (Secondary area of Pain) (Bilateral) 03/10/2019  . Abnormal MRI, lumbar spine (02/08/2019) 03/10/2019  . Grade 1 Anterolisthesis of L4/5 03/11/2019  . Chronic hip pain (Bilateral) (R>L) 03/11/2019  . Chronic sacroiliac joint pain (Bilateral) 03/11/2019  . Sacroiliac joint somatic dysfunction (Bilateral) 03/11/2019  . Chronic feet pain (Primary Area of Pain) (Bilateral) 03/11/2019  . Chronic leg and foot pain (Bilateral) 03/11/2019  . Diabetic peripheral neuropathy ([place]) 03/11/2019  . Chronic neuropathic pain 03/11/2019  . Neurogenic pain 03/11/2019  . Chronic musculoskeletal pain 03/11/2019  . Osteoarthritis involving multiple joints 03/11/2019   Resolved Ambulatory Problems    Diagnosis Date Noted  . Chronic sciatica 12/10/2014  . Microalbuminuria 12/13/2014  .  Chest pain 12/15/2014  . Mixed hyperlipidemia 12/15/2014  . Umbilical hernia without obstruction or gangrene    Past Medical History:  Diagnosis Date  . Allergy   . Arthritis   . Diabetes mellitus without complication ([place])   . GERD (gastroesophageal reflux disease)   . Hypertension   . Obesity (BMI 30.0-34.9) 12/10/2014  . Type 2 diabetes mellitus without complication ([place]) 8/65/7846   Constitutional Exam  General appearance: Well nourished, well developed, and well hydrated. In no apparent acute distress Vitals:   03/11/19 0859  BP: (!) 129/95  Pulse: 86  Resp: 16  Temp: (!) 97 F (36.1 C)  SpO2: 100%  Weight: 172 lb (78 kg)  Height: 5' 3" (1.6 m)   BMI Assessment: Estimated body mass index is  30.47 kg/m as calculated from the following:   Height as of this encounter: 5' 3" (1.6 m).   Weight as of this encounter: 172 lb (78 kg).  BMI interpretation table: BMI level Category Range association with higher incidence of chronic pain  <18 kg/m2 Underweight   18.5-24.9 kg/m2 Ideal body weight   25-29.9 kg/m2 Overweight Increased incidence by 20%  30-34.9 kg/m2 Obese (Class I) Increased incidence by 68%  35-39.9 kg/m2 Severe obesity (Class II) Increased incidence by 136%  >40 kg/m2 Extreme obesity (Class III) Increased incidence by 254%   Patient's current BMI Ideal Body weight  Body mass index is 30.47 kg/m. Ideal body weight: 52.4 kg (115 lb 8.3 oz) Adjusted ideal body weight: 62.6 kg (138 lb 1.8 oz)   BMI Readings from Last 4 Encounters:  03/11/19 30.47 kg/m  02/10/19 30.47 kg/m  01/02/19 31.89 kg/m  12/02/18 33.13 kg/m   Wt Readings from Last 4 Encounters:  03/11/19 172 lb (78 kg)  02/10/19 172 lb (78 kg)  01/02/19 180 lb (81.6 kg)  12/02/18 187 lb (84.8 kg)  Psych/Mental status: Alert, oriented x 3 (person, place, & time)       Eyes: PERLA Respiratory: No evidence of acute respiratory distress  Cervical Spine Area Exam  Skin & Axial Inspection: No masses, redness, edema, swelling, or associated skin lesions Alignment: Symmetrical Functional ROM: Unrestricted ROM      Stability: No instability detected Muscle Tone/Strength: Functionally intact. No obvious neuro-muscular anomalies detected. Sensory (Neurological): Unimpaired Palpation: No palpable anomalies              Upper Extremity (UE) Exam    Side: Right upper extremity  Side: Left upper extremity  Skin & Extremity Inspection: Skin color, temperature, and hair growth are WNL. No peripheral edema or cyanosis. No masses, redness, swelling, asymmetry, or associated skin lesions. No contractures.  Skin & Extremity Inspection: Skin color, temperature, and hair growth are WNL. No peripheral edema or cyanosis.  No masses, redness, swelling, asymmetry, or associated skin lesions. No contractures.  Functional ROM: Unrestricted ROM          Functional ROM: Unrestricted ROM          Muscle Tone/Strength: Functionally intact. No obvious neuro-muscular anomalies detected.  Muscle Tone/Strength: Functionally intact. No obvious neuro-muscular anomalies detected.  Sensory (Neurological): Unimpaired          Sensory (Neurological): Unimpaired          Palpation: No palpable anomalies              Palpation: No palpable anomalies              Provocative Test(s):  Phalen's test: deferred Tinel's test: deferred Apley's scratch  test (touch opposite shoulder):  Action 1 (Across chest): deferred Action 2 (Overhead): deferred Action 3 (LB reach): deferred   Provocative Test(s):  Phalen's test: deferred Tinel's test: deferred Apley's scratch test (touch opposite shoulder):  Action 1 (Across chest): deferred Action 2 (Overhead): deferred Action 3 (LB reach): deferred    Thoracic Spine Area Exam  Skin & Axial Inspection: No masses, redness, or swelling Alignment: Symmetrical Functional ROM: Unrestricted ROM Stability: No instability detected Muscle Tone/Strength: Functionally intact. No obvious neuro-muscular anomalies detected. Sensory (Neurological): Unimpaired Muscle strength & Tone: No palpable anomalies  Lumbar Spine Area Exam  Skin & Axial Inspection: No masses, redness, or swelling Alignment: Symmetrical Functional ROM: Decreased ROM affecting both sides Stability: No instability detected Muscle Tone/Strength: Functionally intact. No obvious neuro-muscular anomalies detected. Sensory (Neurological): Movement-associated discomfort Palpation: Complains of area being tender to palpation       Provocative Tests: Hyperextension/rotation test: (+) bilaterally for facet joint pain. Lumbar quadrant test (Kemp's test): deferred today       Lateral bending test: deferred today       Patrick's Maneuver:  (+) for bilateral S-I arthralgia and for bilateral hip arthralgia FABER* test: (+) for bilateral S-I arthralgia and for bilateral hip arthralgia S-I anterior distraction/compression test: (+) bilateral S-I arthralgia/arthropathy S-I lateral compression test: deferred today         S-I Thigh-thrust test: deferred today         S-I Gaenslen's test: deferred today         *(Flexion, ABduction and External Rotation)  Gait & Posture Assessment  Ambulation: Unassisted Gait: Ataxia (Sensory) (Unsteady "stomping" gait with heavy heel strikes. Postural instability worsened by closing eyes.) Posture: WNL   Lower Extremity Exam    Side: Right lower extremity  Side: Left lower extremity  Stability: No instability observed          Stability: No instability observed          Skin & Extremity Inspection: Skin color, temperature, and hair growth are WNL. No peripheral edema or cyanosis. No masses, redness, swelling, asymmetry, or associated skin lesions. No contractures.  Skin & Extremity Inspection: Skin color, temperature, and hair growth are WNL. No peripheral edema or cyanosis. No masses, redness, swelling, asymmetry, or associated skin lesions. No contractures.  Functional ROM: Decreased ROM for hip and knee joints Adequate SLR (straight leg raise) negative for radiculitis/radiculopathy  Functional ROM: Decreased ROM for hip and knee joints Adequate SLR (straight leg raise) negative for radiculitis/radiculopathy  Muscle Tone/Strength: Able to Toe-walk & Heel-walk without problems  Muscle Tone/Strength: Able to Toe-walk & Heel-walk without problems  Sensory (Neurological): Unimpaired        Sensory (Neurological): Unimpaired        DTR: Patellar: 2+: normal Achilles: 2+: normal Plantar: deferred today  DTR: Patellar: 2+: normal Achilles: 2+: normal Plantar: deferred today  Palpation: No palpable anomalies  Palpation: No palpable anomalies   Assessment  Primary Diagnosis & Pertinent Problem  List: The primary encounter diagnosis was Chronic feet pain (Primary Area of Pain) (Bilateral). Diagnoses of Diabetic peripheral neuropathy (HCC), Chronic leg and foot pain (Bilateral), Chronic lower extremity pain (Secondary area of Pain) (Bilateral), Chronic low back pain (Third area of Pain) (Bilateral) w/o sciatica, Grade 1 Anterolisthesis of L4/5, Chronic sacroiliac joint pain (Bilateral), Sacroiliac joint somatic dysfunction (Bilateral), Chronic hip pain (Bilateral) (R>L), Osteoarthritis involving multiple joints, Chronic pain syndrome, Chronic neuropathic pain, Neurogenic pain, Abnormal MRI, lumbar spine (02/08/2019), Chronic musculoskeletal pain, Pharmacologic therapy, Disorder of skeletal  system, and Problems influencing health status were also pertinent to this visit.  Visit Diagnosis (New problems to examiner): 1. Chronic feet pain (Primary Area of Pain) (Bilateral)   2. Diabetic peripheral neuropathy ([place])   3. Chronic leg and foot pain (Bilateral)   4. Chronic lower extremity pain (Secondary area of Pain) (Bilateral)   5. Chronic low back pain (Third area of Pain) (Bilateral) w/o sciatica   6. Grade 1 Anterolisthesis of L4/5   7. Chronic sacroiliac joint pain (Bilateral)   8. Sacroiliac joint somatic dysfunction (Bilateral)   9. Chronic hip pain (Bilateral) (R>L)   10. Osteoarthritis involving multiple joints   11. Chronic pain syndrome   12. Chronic neuropathic pain   13. Neurogenic pain   14. Abnormal MRI, lumbar spine (02/08/2019)   15. Chronic musculoskeletal pain   16. Pharmacologic therapy   17. Disorder of skeletal system   18. Problems influencing health status    Plan of Care (Initial workup plan)  Note: Ms. Urbas was reminded that as per protocol, today's visit has been an evaluation only. We have not taken over the patient's controlled substance management.  Problem-specific plan: No problem-specific Assessment & Plan notes found for this encounter.   Lab Orders      UDS (Comprehensive-24) ([facility]) ([facility]) (New Pt.)     Magnesium     Sedimentation rate  Imaging Orders     DG Lumbar Spine Complete w/ Flex/Ext (6 Views)     DG Hip (Right)     DG Hip (Left)     DG Sacroiliac Joint X-Rays Referral Orders  No referral(s) requested today   Procedure Orders    No procedure(s) ordered today   Pharmacotherapy (current): Medications ordered:  Meds ordered this encounter  Medications  . pregabalin (LYRICA) 25 MG capsule    Sig: Take 1 capsule (25 mg total) by mouth 3 (three) times daily.    Dispense:  90 capsule    Refill:  0    Fill one day early if pharmacy is closed on scheduled refill date. May substitute for generic if available.   Medications administered during this visit: Lorilynn Lehr had no medications administered during this visit.   Pharmacological management options:  Opioid Analgesics: The patient was informed that there is no guarantee that she would be a candidate for opioid analgesics. The decision will be made following CDC guidelines. This decision will be based on the results of diagnostic studies, as well as Ms. Stanzione's risk profile.   Membrane stabilizer: To be determined at a later time  Muscle relaxant: To be determined at a later time  NSAID: To be determined at a later time  Other analgesic(s): To be determined at a later time   Interventional management options: Ms. Rauth was informed that there is no guarantee that she would be a candidate for interventional therapies. The decision will be based on the results of diagnostic studies, as well as Ms. Hendley's risk profile.  Procedure(s) under consideration:  Depending on the information I get back from her prior pain physician in [place], I may need to order some diagnostic bilateral EMG/PNCV of the lower extremities. Depending on how she does with her new medication regimen, we may need to consider physical therapy for her lower extremity weakness and lack of  coordination. Diagnostic bilateral lumbar sympathetic block  Diagnostic bilateral lumbar facet block  Possible bilateral lumbar facet RFA  Diagnostic bilateral IA hip joint injection  Possible bilateral obturator + femoral NB  Possible  bilateral obturator + femoral nerve RFA  Diagnostic bilateral SI joint block  Possible bilateral SI joint RFA    Provider-requested follow-up: Return in about 2 weeks (around 03/25/2019) for (VV), (MM).  Future Appointments  Date Time Provider [place]  03/22/2019 10:00 AM [facility]  03/25/2019  9:00 AM Milinda Pointer, MD [facility] None  03/26/2019  1:15 PM Lin Landsman, MD [facility] None  04/13/2019  8:30 AM [name], DO [facility]    Primary Care Physician: [name], DO Location: [facility] Note by: Gaspar Cola, MD Date: 03/11/2019; Time: 10:58 AM  Note: This dictation was prepared with Dragon dictation. Any transcriptional errors that may result from this process are unintentional.

## 2019-03-11 ENCOUNTER — Encounter: Payer: Self-pay | Admitting: Pain Medicine

## 2019-03-11 ENCOUNTER — Ambulatory Visit (HOSPITAL_BASED_OUTPATIENT_CLINIC_OR_DEPARTMENT_OTHER): Payer: BLUE CROSS/BLUE SHIELD | Admitting: Pain Medicine

## 2019-03-11 ENCOUNTER — Other Ambulatory Visit: Payer: Self-pay

## 2019-03-11 ENCOUNTER — Ambulatory Visit
Admission: RE | Admit: 2019-03-11 | Discharge: 2019-03-11 | Disposition: A | Discharge status: Home / Self Care | Payer: BLUE CROSS/BLUE SHIELD | Attending: Pain Medicine | Admitting: Pain Medicine

## 2019-03-11 ENCOUNTER — Ambulatory Visit
Admission: RE | Admit: 2019-03-11 | Discharge: 2019-03-11 | Disposition: A | Discharge status: Home / Self Care | Payer: BLUE CROSS/BLUE SHIELD | Source: Ambulatory Visit | Attending: Pain Medicine | Admitting: Pain Medicine

## 2019-03-11 VITALS — BP 129/95 | HR 86 | Temp 97.0°F | Resp 16 | Ht 63.0 in | Wt 172.0 lb

## 2019-03-11 DIAGNOSIS — M79604 Pain in right leg: Secondary | ICD-10-CM | POA: Diagnosis not present

## 2019-03-11 DIAGNOSIS — M899 Disorder of bone, unspecified: Secondary | ICD-10-CM

## 2019-03-11 DIAGNOSIS — M4310 Spondylolisthesis, site unspecified: Secondary | ICD-10-CM | POA: Insufficient documentation

## 2019-03-11 DIAGNOSIS — M5136 Other intervertebral disc degeneration, lumbar region: Secondary | ICD-10-CM | POA: Diagnosis not present

## 2019-03-11 DIAGNOSIS — M533 Sacrococcygeal disorders, not elsewhere classified: Secondary | ICD-10-CM

## 2019-03-11 DIAGNOSIS — M545 Low back pain, unspecified: Secondary | ICD-10-CM

## 2019-03-11 DIAGNOSIS — M150 Primary generalized (osteo)arthritis: Secondary | ICD-10-CM | POA: Insufficient documentation

## 2019-03-11 DIAGNOSIS — M25551 Pain in right hip: Secondary | ICD-10-CM | POA: Insufficient documentation

## 2019-03-11 DIAGNOSIS — M7918 Myalgia, other site: Secondary | ICD-10-CM | POA: Insufficient documentation

## 2019-03-11 DIAGNOSIS — E1142 Type 2 diabetes mellitus with diabetic polyneuropathy: Secondary | ICD-10-CM | POA: Insufficient documentation

## 2019-03-11 DIAGNOSIS — G894 Chronic pain syndrome: Secondary | ICD-10-CM

## 2019-03-11 DIAGNOSIS — M25552 Pain in left hip: Secondary | ICD-10-CM

## 2019-03-11 DIAGNOSIS — R937 Abnormal findings on diagnostic imaging of other parts of musculoskeletal system: Secondary | ICD-10-CM

## 2019-03-11 DIAGNOSIS — M9904 Segmental and somatic dysfunction of sacral region: Secondary | ICD-10-CM

## 2019-03-11 DIAGNOSIS — M79671 Pain in right foot: Secondary | ICD-10-CM | POA: Diagnosis not present

## 2019-03-11 DIAGNOSIS — M8949 Other hypertrophic osteoarthropathy, multiple sites: Secondary | ICD-10-CM

## 2019-03-11 DIAGNOSIS — M79672 Pain in left foot: Secondary | ICD-10-CM

## 2019-03-11 DIAGNOSIS — G8929 Other chronic pain: Secondary | ICD-10-CM | POA: Insufficient documentation

## 2019-03-11 DIAGNOSIS — M79605 Pain in left leg: Secondary | ICD-10-CM

## 2019-03-11 DIAGNOSIS — Z79899 Other long term (current) drug therapy: Secondary | ICD-10-CM | POA: Insufficient documentation

## 2019-03-11 DIAGNOSIS — M159 Polyosteoarthritis, unspecified: Secondary | ICD-10-CM

## 2019-03-11 DIAGNOSIS — Z789 Other specified health status: Secondary | ICD-10-CM

## 2019-03-11 DIAGNOSIS — M792 Neuralgia and neuritis, unspecified: Secondary | ICD-10-CM

## 2019-03-11 MED ORDER — PREGABALIN 25 MG PO CAPS: 25.0000 mg | ORAL_CAPSULE | Freq: Three times a day (TID) | ORAL | 0 refills | Status: DC

## 2019-03-11 NOTE — Progress Notes (Signed)
Safety precautions to be maintained throughout the outpatient stay will include: orient to surroundings, keep bed in low position, maintain call bell within reach at all times, provide assistance with transfer out of bed and ambulation.  

## 2019-03-12 LAB — SEDIMENTATION RATE: Sed Rate: 27 mm/hr (ref 0–40)

## 2019-03-12 LAB — MAGNESIUM: Magnesium: 1.5 mg/dL — ABNORMAL LOW (ref 1.6–2.3)

## 2019-03-13 ENCOUNTER — Other Ambulatory Visit: Payer: Self-pay

## 2019-03-13 DIAGNOSIS — Z1211 Encounter for screening for malignant neoplasm of colon: Secondary | ICD-10-CM

## 2019-03-13 DIAGNOSIS — R1310 Dysphagia, unspecified: Secondary | ICD-10-CM

## 2019-03-13 LAB — COMPLIANCE DRUG ANALYSIS, UR

## 2019-03-13 NOTE — Telephone Encounter (Signed)
Spoke with pt and procedure has been rescheduled to 03/30/2019

## 2019-03-16 ENCOUNTER — Ambulatory Visit: Payer: BLUE CROSS/BLUE SHIELD | Admitting: Pain Medicine

## 2019-03-16 ENCOUNTER — Other Ambulatory Visit: Payer: Self-pay | Admitting: Family Medicine

## 2019-03-16 NOTE — Telephone Encounter (Signed)
Requested medication (s) are due for refill today: yes  Requested medication (s) are on the active medication list:yes  Last refill:  06/05/16  Future visit scheduled: yes  Notes to clinic: expired RX historic provider    Requested Prescriptions  Pending Prescriptions Disp Refills   fexofenadine (ALLEGRA) 180 MG tablet [Pharmacy Med Name: FEXOFENADINE 180MG  TABLETS (OTC)] 90 tablet     Sig: TAKE 1 TABLET BY MOUTH EVERY DAY     Ear, Nose, and Throat:  Antihistamines Passed - 03/16/2019  4:36 PM      Passed - Valid encounter within last 12 months    Recent Outpatient Visits          2 months ago Oral thrush   [place], [place]   3 months ago Nausea vomiting and diarrhea   [facility], [place], [place]   5 months ago Routine general medical examination at a health care facility   [facility], [place] P, DO   7 months ago Allergic conjunctivitis of both eyes   [place], Megan P, DO   9 months ago Type 2 diabetes mellitus with diabetic neuropathy, without long-term current use of insulin ([place])   [place], Barb Merino, DO      Future Appointments            In 1 week Milinda Pointer, MD [place]   In 1 week [place], Tally Due, MD [place]   In 4 weeks Wynetta Emery, Barb Merino, DO [employer], [facility]

## 2019-03-17 NOTE — Telephone Encounter (Signed)
Routing to provider  

## 2019-03-22 ENCOUNTER — Other Ambulatory Visit: Payer: Self-pay

## 2019-03-22 ENCOUNTER — Ambulatory Visit
Admission: RE | Admit: 2019-03-22 | Discharge: 2019-03-22 | Disposition: A | Discharge status: Home / Self Care | Payer: BLUE CROSS/BLUE SHIELD | Source: Ambulatory Visit | Attending: Neurology | Admitting: Neurology

## 2019-03-22 DIAGNOSIS — R200 Anesthesia of skin: Secondary | ICD-10-CM | POA: Diagnosis not present

## 2019-03-22 DIAGNOSIS — M50221 Other cervical disc displacement at C4-C5 level: Secondary | ICD-10-CM | POA: Diagnosis not present

## 2019-03-22 DIAGNOSIS — R202 Paresthesia of skin: Secondary | ICD-10-CM | POA: Diagnosis not present

## 2019-03-22 LAB — POCT I-STAT CREATININE: Creatinine, Ser: 0.8 mg/dL (ref 0.44–1.00)

## 2019-03-22 MED ORDER — GADOBUTROL 1 MMOL/ML IV SOLN
8.0000 mL | Freq: Once | INTRAVENOUS | Status: AC | PRN
  Administered 2019-03-22: 8 mL via INTRAVENOUS

## 2019-03-22 MED ORDER — GADOBUTROL 1 MMOL/ML IV SOLN: 7.5000 mL | Freq: Once | INTRAVENOUS | Status: DC | PRN

## 2019-03-24 DIAGNOSIS — M47817 Spondylosis without myelopathy or radiculopathy, lumbosacral region: Secondary | ICD-10-CM | POA: Insufficient documentation

## 2019-03-24 DIAGNOSIS — M47816 Spondylosis without myelopathy or radiculopathy, lumbar region: Secondary | ICD-10-CM | POA: Insufficient documentation

## 2019-03-24 NOTE — Patient Instructions (Signed)

## 2019-03-24 NOTE — Progress Notes (Signed)
Patient's Name: Lisa Galvan  MRN: [id number]  Referring Provider: [name], DO  DOB: 06/06/1960  PCP: [name], DO  DOS: 03/25/2019  Note by: Gaspar Cola, MD  Service setting: Virtual Visit (Telephone)  Attending: Gaspar Cola, MD  Location: Telephone Encounter  Specialty: Interventional Pain Management  Patient type: Established   Pain Management Virtual Encounter Note - Virtual Visit via Telephone Telehealth (real-time audio visits between healthcare provider and patient).   Patient's Phone No.:  [phone] (home); [phone] (mobile); (Preferred) [phone] [email or web address]  [id number] [address], [place] AT [facility] OF [address] [place] [address] Phone: [phone] Fax: [phone]    Pre-screening note:  Our staff contacted Lisa Galvan and offered her an "in person", "face-to-face" appointment versus a telephone encounter. She indicated preferring the telephone encounter, at this time.   Primary Reason(s) for Virtual Visit: Encounter for evaluation before starting new chronic pain management plan of care (Level of risk: moderate) COVID-19*  Social distancing based on CDC ans AMA recommendations.    I contacted Lisa Galvan on 03/25/2019 via telephone.      I clearly identified myself as Gaspar Cola, MD. I verified that I was speaking with the correct person using two identifiers (Name: Philisha Weinel, and date of birth: 1960-06-12).  Advanced Informed Consent I sought verbal advanced consent from Lisa Galvan for virtual visit interactions. I informed [name] of possible security and privacy concerns, risks, and limitations associated with providing "not-in-person" medical evaluation and management services. I also informed [name] of the availability of "in-person" appointments. Finally, I informed her that there would be a charge for the virtual visit and that she could be   personally, fully or partially, financially responsible for it. Ms. Kalp expressed understanding and agreed to proceed.   Historic Elements   [name] is a 58 y.o. year old, female patient evaluated today after her last encounter by our practice on 03/11/2019. Lisa Galvan  has a past medical history of Allergy, Arthritis, Bilateral leg edema, Chest pain (12/15/2014), Chronic sciatica (12/10/2014), Chronic sciatica (12/10/2014), Diabetes mellitus without complication ([place]), Essential hypertension (12/10/2014), Essential hypertension (12/10/2014), Gastroesophageal reflux disease without esophagitis, GERD (gastroesophageal reflux disease), Hyperlipidemia, Hypertension, Mixed hyperlipidemia, Obesity (BMI 30.0-34.9) (12/10/2014), and Type 2 diabetes mellitus without complication ([place]) (3/55/9741). She also  has a past surgical history that includes Abdominal hysterectomy; Esophagogastroduodenoscopy (egd) with propofol (N/A, 03/12/2017); Umbilical hernia repair (N/A, 05/01/2017); and Hernia repair. Lisa Galvan has a current medication list which includes the following prescription(s): amlodipine, atorvastatin, vitamin d-3, duloxetine, jardiance, epinephrine, fexofenadine, ibuprofen, ketoconazole, magnesium oxide, metformin, montelukast, nystatin, olopatadine hcl, omeprazole, pantoprazole, pioglitazone, pregabalin, and valacyclovir. She  reports that she has never smoked. She has never used smokeless tobacco. She reports current alcohol use of about 2.0 standard drinks of alcohol per week. She reports that she does not use drugs. Lisa Galvan is allergic to amlodipine besy-benazepril hcl; ivp dye [iodinated diagnostic agents]; shellfish allergy; shellfish-derived products; and ace inhibitors.   HPI  She is being evaluated for review of studies ordered on initial visit and to consider treatment plan options. Today I went over the results of her tests. These were explained in "Layman's terms". During today's appointment I went  over my diagnostic impression, as well as the proposed treatment plan.   The x-rays of the hips and the SI joint were read as negative suggesting that pain in  those areas is likely to be referred from findings in the lumbar spine.  A recent MRI of the thoracic spine was completely within normal limits.  MRI of the cervical spine shows Spondylosis most notable at C3-4 where there is moderate bilateral foraminal narrowing due to uncovertebral disease and facet arthropathy. The ventral thecal sac is narrowed but not effaced at this level. Mild left foraminal narrowing C2-3.  An MRI of the lumbar spine shows Superior endplate compression fracture at L3 with minimal loss of height but no retropulsed bone or stenosis. Mild left sub articular and foraminal narrowing at L4-5 is slightly worse than on the prior study. Mild right foraminal narrowing at L5-S1 is worse than on the prior study. Mild disc bulging and facet hypertrophy at L2-3 and L3-4 without significant stenosis.  In addition, we are seeing the patient today to evaluate the medication changes were we have gone from the gabapentin to a Lyrica trial.  The patient returns today for follow-upt evaluation.  According to the patient her primary pain is that of the lower extremities, bilaterally, with both of them being equally as bad.  The pain is primarily in both of her feet and it feels like a burning sensation.  The patient does have a history of diabetes and her last documented hemoglobin A1c was 7.4.  However, she indicates that the last one that she had done was 6.4, but there is no documentation about that.  The patient describes having treated this burning sensation effectively with gabapentin 600 mg p.o. 3 times daily, but she experienced some hair loss and had to come off of that.  Currently she is taking duloxetine 60 mg p.o. twice daily + gabapentin 300 mg p.o. 3 times daily.  She refers that most of those medications usually work well at the  beginning, but then they wore off.  Specifically she indicates that the medicine does not seem to last 8 hours after she takes it.  In the past, she indicates having had some of this pain treated by a pain specialist (interventional pain management specialist) find an name of Dr. Darrol Jump. Dr. Darrol Jump, [address] [place] [address]. Tel.: [phone] Fax.: [phone].  She refers having had some nerve blocks done that usually will last for about a year.  Her condition has been slowly getting worse and currently she is also experiencing bilateral low back pain with the left being worse than the right.  Physical exam positive for bilateral lumbar facet pain, bilateral sacroiliac joint pain, and bilateral hip joint pain.  She also presents with decreased range of motion on hyperextension and rotation as well as decreased range of motion of her hip joints, with the right side being worse than the left.  The physical exam today was negative for any active radiculopathies.  She was able to toe walk and heel walk without any problems.  She indicates having moved to the [place] area approximately 3 years ago.  She indicates having done some physical therapy in the past, but it was more than 2 years ago.  In addition, she indicates having some problems walking due to lack of perception in her feet.  We may need to consider EMG/PNCV if these are not available.  In considering the treatment plan options, Ms. Errickson was reminded that I no longer take patients for medication management only. I asked her to let me know if she had no intention of taking advantage  of the interventional therapies, so that we could make arrangements to provide this space to someone interested. I also made it clear that undergoing interventional therapies for the purpose of getting pain medications is very inappropriate on the part of a patient, and it will not be tolerated in this practice. This type  of behavior would suggest true addiction and therefore it requires referral to an addiction specialist.   I discussed the assessment and treatment plan with the patient. The patient was provided an opportunity to ask questions and all were answered. The patient agreed with the plan and demonstrated an understanding of the instructions.  Patient advised to call back or seek an in-person evaluation if the symptoms or condition worsens.  Controlled Substance Pharmacotherapy Assessment REMS (Risk Evaluation and Mitigation Strategy)  Analgesic: No opioid analgesics prescribed by our practice.   Monitoring: [place] PMP: PDMP reviewed during this encounter.       Not applicable at this point since we have not taken over the patient's medication management yet. List of other Serum/Urine Drug Screening Test(s):  No results found. List of all UDS test(s) done:  Lab Results  Component Value Date   SUMMARY Note 03/11/2019   Last UDS on record: Summary  Date Value Ref Range Status  03/11/2019 Note  Final    Comment:    ==================================================================== Compliance Drug Analysis, Ur ==================================================================== Test                             Result       Flag       Units Drug Present   Gabapentin                     PRESENT   Duloxetine                     PRESENT   Ibuprofen                      PRESENT ==================================================================== Test                      Result    Flag   Units      Ref Range   Creatinine              90               mg/dL      >=20 ==================================================================== Declared Medications:  Medication list was not provided. ==================================================================== For clinical consultation, please call [phone]. ====================================================================    UDS  interpretation: No unexpected findings.          Medication Assessment Form: Patient introduced to form today Treatment compliance: Treatment may start today if patient agrees with proposed plan. Evaluation of compliance is not applicable at this point Risk Assessment Profile: Aberrant behavior: See initial evaluations. None observed or detected today Comorbid factors increasing risk of overdose: See initial evaluation. No additional risks detected today Opioid risk tool (ORT):  Opioid Risk  03/11/2019  Alcohol 0  Illegal Drugs 0  Rx Drugs 0  Alcohol 0  Illegal Drugs 0  Rx Drugs 0  Age between 16-45 years  0  History of Preadolescent Sexual Abuse 0  Psychological Disease 0  Depression 0  Opioid Risk Tool Scoring 0  Opioid Risk Interpretation Low Risk    ORT Scoring interpretation table:  Score <3 = Low Risk  for SUD  Score between 4-7 = Moderate Risk for SUD  Score >8 = High Risk for Opioid Abuse   Risk of substance use disorder (SUD): Low  Risk Mitigation Strategies:  Patient opioid safety counseling: Completed today. Counseling provided to patient as per "Patient Counseling Document". Document signed by patient, attesting to counseling and understanding Patient-Prescriber Agreement (PPA): Obtained today.  Controlled substance notification to other providers: Written and sent today.  Pharmacologic Plan: Today we may be taking over the patient's pharmacological regimen. See below.             Meds   Current Outpatient Medications:  .  amLODipine (NORVASC) 5 MG tablet, TAKE 1 TABLET(5 MG) BY MOUTH DAILY, Disp: 30 tablet, Rfl: 6 .  atorvastatin (LIPITOR) 20 MG tablet, Take 1 tablet (20 mg total) by mouth daily., Disp: 30 tablet, Rfl: 6 .  Cholecalciferol (VITAMIN D-3) 5000 units TABS, Take 5,000 Units by mouth daily., Disp: , Rfl:  .  DULoxetine (CYMBALTA) 60 MG capsule, Take 60 mg by mouth daily., Disp: , Rfl:  .  empagliflozin (JARDIANCE) 10 MG TABS tablet, Take 10 mg by  mouth daily., Disp: 30 tablet, Rfl: 6 .  EPINEPHrine (EPIPEN 2-PAK) 0.3 mg/0.3 mL IJ SOAJ injection, Inject 0.3 mLs (0.3 mg total) into the muscle once. Take for severe allergic reaction, then come immediately to the Emergency Department or call 911., Disp: 1 Device, Rfl: 0 .  fexofenadine (ALLEGRA) 180 MG tablet, TAKE 1 TABLET BY MOUTH EVERY DAY, Disp: 90 tablet, Rfl: 3 .  ibuprofen (ADVIL) 800 MG tablet, TAKE 1 TABLET(800 MG) BY MOUTH EVERY 8 HOURS AS NEEDED, Disp: 90 tablet, Rfl: 6 .  ketoconazole (NIZORAL) 2 % cream, Apply 1 application topically daily., Disp: 60 g, Rfl: 1 .  Magnesium Oxide 500 MG CAPS, Take 1 capsule (500 mg total) by mouth 2 (two) times daily at 8 am and 10 pm., Disp: 60 capsule, Rfl: 5 .  metFORMIN (GLUCOPHAGE) 500 MG tablet, Take 1 tablet (500 mg total) by mouth 2 (two) times daily with a meal., Disp: 60 tablet, Rfl: 6 .  montelukast (SINGULAIR) 10 MG tablet, Take 1 tablet (10 mg total) by mouth at bedtime., Disp: 30 tablet, Rfl: 6 .  nystatin (MYCOSTATIN) 100000 UNIT/ML suspension, Take 5 mLs (500,000 Units total) by mouth 4 (four) times daily., Disp: 60 mL, Rfl: 0 .  Olopatadine HCl 0.2 % SOLN, 1 drop to each eye daily, Disp: 2.5 mL, Rfl: 6 .  omeprazole (PRILOSEC) 40 MG capsule, Take 1 capsule (40 mg total) by mouth daily before breakfast., Disp: 30 capsule, Rfl: 1 .  pantoprazole (PROTONIX) 40 MG tablet, TAKE 1 TABLET BY MOUTH TWICE DAILY, Disp: 60 tablet, Rfl: 6 .  pioglitazone (ACTOS) 30 MG tablet, TAKE 1 TABLET(30 MG) BY MOUTH DAILY, Disp: 30 tablet, Rfl: 6 .  pregabalin (LYRICA) 50 MG capsule, Take 1 capsule (50 mg total) by mouth 3 (three) times daily., Disp: 90 capsule, Rfl: 1 .  valACYclovir (VALTREX) 500 MG tablet, Take 500 mg by mouth 2 (two) times daily as needed (for outbreak). , Disp: , Rfl:   Laboratory Chemistry Profile   Screening Lab Results  Component Value Date   SARSCOV2NAA Not Detected 12/02/2018   HIV Non Reactive 07/03/2017     Inflammation (CRP: Acute Phase) (ESR: Chronic Phase) Lab Results  Component Value Date   CRP <1 02/10/2019   ESRSEDRATE 27 03/11/2019  Rheumatology No results found.  Renal Lab Results  Component Value Date   BUN 7 10/06/2018   CREATININE 0.80 03/22/2019   BCR 8 (L) 10/06/2018   GFRAA 82 10/06/2018   GFRNONAA 71 10/06/2018                             Hepatic Lab Results  Component Value Date   AST 20 10/06/2018   ALT 12 10/06/2018   ALBUMIN 4.7 10/06/2018   ALKPHOS 64 10/06/2018                        Electrolytes Lab Results  Component Value Date   NA 143 10/06/2018   K 4.2 10/06/2018   CL 103 10/06/2018   CALCIUM 10.1 10/06/2018   MG 1.5 (L) 03/11/2019   PHOS 4.7 (H) 07/03/2017                        Neuropathy Lab Results  Component Value Date   HGBA1C 6.4 10/06/2018   HIV Non Reactive 07/03/2017                        CNS No results found.  Bone Lab Results  Component Value Date   VD25OH 58.9 10/06/2018                         Coagulation Lab Results  Component Value Date   PLT 231 10/06/2018                        Cardiovascular Lab Results  Component Value Date   TROPONINI <0.03 11/08/2016   HGB 13.2 10/06/2018   HCT 39.1 10/06/2018                         ID Lab Results  Component Value Date   HIV Non Reactive 07/03/2017   SARSCOV2NAA Not Detected 12/02/2018    Cancer No results found.  Endocrine Lab Results  Component Value Date   TSH 0.820 10/06/2018                        Note: Lab results reviewed.  Recent Diagnostic Imaging Review  Cervical Imaging: Cervical MR w/wo contrast:  Results for orders placed during the hospital encounter of 03/22/19  MR CERVICAL SPINE W WO CONTRAST   Narrative CLINICAL DATA:  Bilateral leg pain, numbness and tingling for 3-4 months. No known injury.  EXAM: MRI CERVICAL SPINE WITHOUT AND WITH CONTRAST  TECHNIQUE: Multiplanar and multiecho pulse  sequences of the cervical spine, to include the craniocervical junction and cervicothoracic junction, were obtained without and with intravenous contrast.  CONTRAST:  8 mL GADAVIST IV  COMPARISON:  None.  FINDINGS: Alignment: There is mild reversal of the normal cervical lordosis. No listhesis.  Vertebrae: No fracture, evidence of discitis, or bone lesion.  Cord: Normal signal throughout. No pathologic enhancement after contrast administration.  Posterior Fossa, vertebral arteries, paraspinal tissues: Negative.  Disc levels:  C2-3: Moderate bilateral facet degenerative disease is worse on the left. There is a very shallow disc bulge. Mild right foraminal narrowing is present. The central canal and left foramen are open.  C3-4: Left worse than right facet degenerative disease. There is a shallow disc osteophyte complex and uncovertebral spurring.  The ventral thecal sac is narrowed but not effaced. Moderate bilateral foraminal narrowing appears worse on the left.  C4-5: Minimal disc bulge and mild uncovertebral spurring. No stenosis.  C5-6: Minimal disc bulge without stenosis.  C6-7: Negative.  C7-T1: Negative.  IMPRESSION: 1. Spondylosis most notable at C3-4 where there is moderate bilateral foraminal narrowing due to uncovertebral disease and facet arthropathy. The ventral thecal sac is narrowed but not effaced at this level. 2. Mild left foraminal narrowing C2-3.   Electronically Signed   By: Inge Rise M.D.   On: 03/22/2019 10:46    Thoracic Imaging: Thoracic MR wo contrast:  Results for orders placed during the hospital encounter of 02/08/19  MR THORACIC SPINE WO CONTRAST   Narrative CLINICAL DATA:  Low back pain. Bilateral lower extremity radicular pain extending to the feet.  EXAM: MRI THORACIC AND LUMBAR SPINE WITHOUT CONTRAST  TECHNIQUE: Multiplanar and multiecho pulse sequences of the thoracic and lumbar spine were obtained without  intravenous contrast.  COMPARISON:  MRI lumbar spine 04/10/2017  FINDINGS: MRI THORACIC SPINE FINDINGS  Alignment:  Anatomic  Vertebrae: Marrow signal and vertebral body heights are normal.  Cord:  Normal signal is present throughout the thoracic spinal cord.  Paraspinal and other soft tissues: The paraspinous soft tissues are within normal limits. Visualized lung fields are clear.  Disc levels:  No significant focal disc protrusion is present in the thoracic spine. The central canal and foramina are patent throughout.  MRI LUMBAR SPINE FINDINGS  Segmentation: 5 non rib-bearing lumbar type vertebral bodies are present. The lowest fully formed vertebral body is L5.  Alignment: Grade 1 anterolisthesis is present at L4-5 without significant interval change. No other significant listhesis is present.  Vertebrae: A superior endplate fracture is present at L3. There is edema in the upper 2/3 of the vertebral body. Minimal loss of height is present. There is no retropulsed bone. Marrow signal and vertebral body heights are otherwise normal.  Conus medullaris and cauda equina: Conus extends to the L1-2 level. Conus and cauda equina appear normal.  Paraspinal and other soft tissues: Limited imaging the abdomen is unremarkable. There is no significant adenopathy. No solid organ lesions are present.  Disc levels:  L1-2: Negative.  L2-3: Mild disc bulging and facet hypertrophy is present. There is no significant stenosis.  L3-4: A broad-based disc protrusion has progressed some. Mild facet hypertrophy is noted bilaterally. No focal stenosis is present.  L4-5: There is uncovering of a broad-based disc protrusion. Moderate facet hypertrophy has progressed. Mild left sub articular and foraminal narrowing is present.  L5-S1: Moderate facet hypertrophy has progressed. Mild right foraminal narrowing is worse than on the prior study.  IMPRESSION: 1. Superior endplate  compression fracture at L3 with minimal loss of height but no retropulsed bone or stenosis. 2. Mild left sub articular and foraminal narrowing at L4-5 is slightly worse than on the prior study. 3. Mild right foraminal narrowing at L5-S1 is worse than on the prior study. 4. Mild disc bulging and facet hypertrophy at L2-3 and L3-4 without significant stenosis. 5. Unremarkable MRI of the thoracic spine.   Electronically Signed   By: San Morelle M.D.   On: 02/08/2019 22:52    Lumbosacral Imaging: Lumbar MR wo contrast:  Results for orders placed during the hospital encounter of 02/08/19  MR LUMBAR SPINE WO CONTRAST   Narrative CLINICAL DATA:  Low back pain. Bilateral lower extremity radicular pain extending to the feet.  EXAM: MRI THORACIC AND LUMBAR SPINE  WITHOUT CONTRAST  TECHNIQUE: Multiplanar and multiecho pulse sequences of the thoracic and lumbar spine were obtained without intravenous contrast.  COMPARISON:  MRI lumbar spine 04/10/2017  FINDINGS: MRI THORACIC SPINE FINDINGS  Alignment:  Anatomic  Vertebrae: Marrow signal and vertebral body heights are normal.  Cord:  Normal signal is present throughout the thoracic spinal cord.  Paraspinal and other soft tissues: The paraspinous soft tissues are within normal limits. Visualized lung fields are clear.  Disc levels:  No significant focal disc protrusion is present in the thoracic spine. The central canal and foramina are patent throughout.  MRI LUMBAR SPINE FINDINGS  Segmentation: 5 non rib-bearing lumbar type vertebral bodies are present. The lowest fully formed vertebral body is L5.  Alignment: Grade 1 anterolisthesis is present at L4-5 without significant interval change. No other significant listhesis is present.  Vertebrae: A superior endplate fracture is present at L3. There is edema in the upper 2/3 of the vertebral body. Minimal loss of height is present. There is no retropulsed bone.  Marrow signal and vertebral body heights are otherwise normal.  Conus medullaris and cauda equina: Conus extends to the L1-2 level. Conus and cauda equina appear normal.  Paraspinal and other soft tissues: Limited imaging the abdomen is unremarkable. There is no significant adenopathy. No solid organ lesions are present.  Disc levels:  L1-2: Negative.  L2-3: Mild disc bulging and facet hypertrophy is present. There is no significant stenosis.  L3-4: A broad-based disc protrusion has progressed some. Mild facet hypertrophy is noted bilaterally. No focal stenosis is present.  L4-5: There is uncovering of a broad-based disc protrusion. Moderate facet hypertrophy has progressed. Mild left sub articular and foraminal narrowing is present.  L5-S1: Moderate facet hypertrophy has progressed. Mild right foraminal narrowing is worse than on the prior study.  IMPRESSION: 1. Superior endplate compression fracture at L3 with minimal loss of height but no retropulsed bone or stenosis. 2. Mild left sub articular and foraminal narrowing at L4-5 is slightly worse than on the prior study. 3. Mild right foraminal narrowing at L5-S1 is worse than on the prior study. 4. Mild disc bulging and facet hypertrophy at L2-3 and L3-4 without significant stenosis. 5. Unremarkable MRI of the thoracic spine.   Electronically Signed   By: San Morelle M.D.   On: 02/08/2019 22:52    Lumbar DG (Complete) 4+V:  Results for orders placed during the hospital encounter of 03/04/17  DG Lumbar Spine Complete   Narrative CLINICAL DATA:  58 year old female with bilateral leg pain. No reported injury. Initial encounter.  EXAM: LUMBAR SPINE - COMPLETE 4+ VIEW  COMPARISON:  None.  FINDINGS: 3 mm anterior slip L4 secondary to facet degenerative changes. Mild L4-5 disc space narrowing.  L5-S1 facet degenerative changes greater on the right.  Calcified uterine fibroid suspected.  IMPRESSION: 3  mm anterior slip L4 secondary to facet degenerative changes. Mild L4-5 disc space narrowing.  L5-S1 facet degenerative changes greater on the right.   Electronically Signed   By: Genia Del M.D.   On: 03/04/2017 12:01          Lumbar DG Bending views:  Results for orders placed during the hospital encounter of 03/11/19  DG Lumbar Spine Complete w/ Flex/Ext (6 Views)   Narrative CLINICAL DATA:  Chronic low back and bilateral lower extremity pain.  EXAM: LUMBAR SPINE - COMPLETE WITH BENDING VIEWS  COMPARISON:  None.  FINDINGS: Mild grade 1 anterolisthesis of L4-5 is noted secondary to posterior facet joint  hypertrophy. No change in vertebral body alignment is noted on flexion or extension views. Mild degenerative disc disease is noted at L2-3 and L4-5. No fracture is noted. Calcified uterine fibroid is noted in the pelvis.  IMPRESSION: Mild grade 1 anterolisthesis of L4-5 is noted secondary to posterior facet joint hypertrophy. No change in vertebral body alignment is noted on flexion or extension views. Mild multilevel degenerative disc disease is noted. No acute abnormality is noted in lumbar spine.   Electronically Signed   By: Marijo Conception M.D.   On: 03/11/2019 16:11          Sacroiliac Joint Imaging: Sacroiliac Joint DG:  Results for orders placed during the hospital encounter of 03/11/19  DG Sacroiliac Joint X-Rays   Narrative CLINICAL DATA:  Chronic low back and bilateral hip pain.  EXAM: BILATERAL SACROILIAC JOINTS - 3+ VIEW  COMPARISON:  None.  FINDINGS: The sacroiliac joint spaces are maintained and there is no evidence of arthropathy. No other bone abnormalities are seen. Calcified uterine fibroid is noted.  IMPRESSION: Negative.   Electronically Signed   By: Marijo Conception M.D.   On: 03/11/2019 16:14    Hip Imaging: Hip-R DG 2-3 views:  Results for orders placed during the hospital encounter of 03/11/19  DG Hip (Right)   Narrative  CLINICAL DATA:  Chronic right hip pain.  EXAM: DG HIP (WITH OR WITHOUT PELVIS) 2-3V RIGHT  COMPARISON:  None.  FINDINGS: There is no evidence of hip fracture or dislocation. There is no evidence of arthropathy or other focal bone abnormality.  IMPRESSION: Negative.   Electronically Signed   By: Marijo Conception M.D.   On: 03/11/2019 16:12    Hip-L DG 2-3 views:  Results for orders placed during the hospital encounter of 03/11/19  DG Hip (Left)   Narrative CLINICAL DATA:  Chronic left hip pain.  EXAM: DG HIP (WITH OR WITHOUT PELVIS) 2-3V LEFT  COMPARISON:  None.  FINDINGS: There is no evidence of hip fracture or dislocation. There is no evidence of arthropathy or other focal bone abnormality.  IMPRESSION: Negative.   Electronically Signed   By: Marijo Conception M.D.   On: 03/11/2019 16:13    Complexity Note: Imaging results reviewed. Results shared with Ms. Zwack, using [employer].                         Assessment  The primary encounter diagnosis was Chronic feet pain (Primary Area of Pain) (Bilateral). Diagnoses of Diabetic peripheral neuropathy ([place]), Chronic lower extremity pain (Secondary area of Pain) (Bilateral), Lower extremity weakness (Bilateral), Coordination impairment (lower extremity), Chronic low back pain (Third area of Pain) (Bilateral) w/o sciatica, Lumbar Facet Hypertrophy, Lumbar facet syndrome (Bilateral), Spondylosis without myelopathy or radiculopathy, lumbosacral region, Chronic neuropathic pain, Neurogenic pain, Abnormal MRI, cervical spine, Abnormal MRI, lumbar spine (02/08/2019), and Hypomagnesemia were also pertinent to this visit.  Plan of Care  I have changed Mikena Cueva's pregabalin. I am also having her start on Magnesium Oxide. Additionally, I am having her maintain her EPINEPHrine, valACYclovir, Vitamin D-3, pioglitazone, pantoprazole, Olopatadine HCl, montelukast, metFORMIN, Jardiance, ibuprofen, atorvastatin, amLODipine,  ketoconazole, nystatin, omeprazole, DULoxetine, and fexofenadine. Pharmacotherapy (Medications Ordered): Meds ordered this encounter  Medications  . Magnesium Oxide 500 MG CAPS    Sig: Take 1 capsule (500 mg total) by mouth 2 (two) times daily at 8 am and 10 pm.    Dispense:  60 capsule    Refill:  5    Fill one day early if pharmacy is closed on scheduled refill date. May substitute for generic if available.  . pregabalin (LYRICA) 50 MG capsule    Sig: Take 1 capsule (50 mg total) by mouth 3 (three) times daily.    Dispense:  90 capsule    Refill:  1    Fill one day early if pharmacy is closed on scheduled refill date. May substitute for generic if available.    Procedure Orders     L-FCT Blk (Schedule) Lab Orders  No laboratory test(s) ordered today   Imaging Orders  No imaging studies ordered today    Referral Orders     AMB PT Referral (2-3x/wk, x 6wks)  Orders:  Orders Placed This Encounter  Procedures  . L-FCT Blk (Schedule)    Standing Status:   Future    Standing Expiration Date:   04/24/2019    Scheduling Instructions:     Procedure: Lumbar facet block (AKA.: Lumbosacral medial branch nerve block)     Side: Bilateral     Level: L3-4, L4-5, & L5-S1 Facets (L2, L3, L4, L5, & S1 Medial Branch Nerves)     Sedation: Patient's choice.     Timeframe: ASAA    Order Specific Question:   Where will this procedure be performed?    Answer:   [facility]  . AMB PT Referral (2-3x/wk, x 6wks)    Referral Priority:   Routine    Referral Type:   Physical Medicine    Referral Reason:   Specialty Services Required    Requested Specialty:   Physical Therapy    Number of Visits Requested:   1  . EMG/PNCV (Lower Extremity)    Bilateral testing requested.    Standing Status:   Future    Standing Expiration Date:   03/24/2020    Scheduling Instructions:     Please refer this patient to [facility] for Nerve Conduction testing of the lower extremities. (EMG &  PNCV)    Order Specific Question:   Where should this test be performed?    Answer:   Other   Pharmacological management options:  Opioid Analgesics: We'll take over management today. See above orders Membrane stabilizer: Options discussed, including a trial. Muscle relaxant: We have discussed the possibility of a trial NSAID: Trial discussed. Other analgesic(s): To be determined at a later time    Interventional management options: Planned, scheduled, and/or pending:    Diagnostic bilateral lumbar facet block #1    Considering:   EMG/PNCV of the lower extremities ordered today. Physical therapy referral for treatment of bilateral lower extremity weakness and lack of coordination, ordered today. Diagnostic bilateral lumbar sympathetic block  Diagnostic bilateral lumbar facet block  Possible bilateral lumbar facet RFA  Diagnostic bilateral IA hip joint injection  Possible bilateral obturator + femoral NB  Possible bilateral obturator + femoral nerve RFA  Diagnostic bilateral SI joint block  Possible bilateral SI joint RFA    PRN Procedures:   None at this time    Total duration of non-face-to-face encounter: 30 minutes.  Follow-up plan:   Return in about 2 months (around 05/26/2019) for (VV), (MM), in addition, Procedure (w/ sedation): (B) L-FCT BLK #1, (ASAP).    Recent Visits Date Type Provider Dept  03/11/19 Office Visit Milinda Pointer, MD [facility]  Showing recent visits within past 90 days and meeting all other requirements   Today's Visits Date Type Provider Dept  03/25/19 Telemedicine  Milinda Pointer, MD [facility]  Showing today's visits and meeting all other requirements   Future Appointments No visits were found meeting these conditions.  Showing future appointments within next 90 days and meeting all other requirements   Primary Care Physician: [name], DO Location: Telephone Virtual Visit Note by: Gaspar Cola,  MD Date: 03/25/2019; Time: 10:20 AM  Note: This dictation was prepared with Dragon dictation. Any transcriptional errors that may result from this process are unintentional.  Disclaimer:  * Given the special circumstances of the COVID-19 pandemic, the federal government has announced that the Office for Civil Rights (OCR) will exercise its enforcement discretion and will not impose penalties on physicians using telehealth in the event of noncompliance with regulatory requirements under the [place] (HIPAA) in connection with the good faith provision of telehealth during the ILNZV-72 national public health emergency. ([place])

## 2019-03-24 NOTE — Progress Notes (Signed)
Normal Magnesium level(s): between 1.7 and 2.4 mEq/L. Low Magnesium Level(s): Levels below 1.7 mEq/L. (Known as "hypomagnesemia") Clinical significance: Low magnesium blood level can lead to low calcium and potassium levels. Low levels may indicate inadequate dietary consuming, poor absorbtion, or excessive excretion. It can lead to low potasium  Signs and symptoms may include: Deficiency of magnesium can cause tiredness, generalized weakness, muscle cramps, abnormal heart rhythms, increased irritability of the nervous system with tremors, paresthesias, palpitations, hypokalemia, hypoparathyroidism which might result in hypocalcemia, chondrocalcinosis, spasticity and tetany, epileptic seizures, basal ganglia calcifications and in extreme and prolonged cases coma, intellectual disability or death.[3] Other symptoms that have been suggested to be associated with hypomagnesemia are athetosis, jerking, nystagmus, and an extensor plantar reflex, confusion, disorientation, hallucinations, depression, hypertension and fast heart rate. Hospitalized patients being treated on an intensive care unit who have a low magnesium level may have a higher risk of respiratory failure, and death. Possible causes: Causes include alcoholism, starvation, diarrhea, increased urinary loss, and poor absorption from the intestines. - Medications: Loop and thiazide diuretics. Certain antibiotics. Heartburn medicines such as omeprazole. Etc. - Medical: Genetic mutations. Gastrointestinal diseases. Malabsorption, acute pancreatitis, fluoride poisoning, etc.  Recommendations: - Contact primary care physician for further evaluation and recommendations. - Consider taking over-the-counter supplements. Consult your primary care physician first. 

## 2019-03-25 ENCOUNTER — Ambulatory Visit: Payer: BLUE CROSS/BLUE SHIELD | Attending: Pain Medicine | Admitting: Pain Medicine

## 2019-03-25 ENCOUNTER — Other Ambulatory Visit: Payer: Self-pay

## 2019-03-25 DIAGNOSIS — M79671 Pain in right foot: Secondary | ICD-10-CM

## 2019-03-25 DIAGNOSIS — E1142 Type 2 diabetes mellitus with diabetic polyneuropathy: Secondary | ICD-10-CM | POA: Diagnosis not present

## 2019-03-25 DIAGNOSIS — M79672 Pain in left foot: Secondary | ICD-10-CM

## 2019-03-25 DIAGNOSIS — R278 Other lack of coordination: Secondary | ICD-10-CM | POA: Insufficient documentation

## 2019-03-25 DIAGNOSIS — R29898 Other symptoms and signs involving the musculoskeletal system: Secondary | ICD-10-CM | POA: Insufficient documentation

## 2019-03-25 DIAGNOSIS — M792 Neuralgia and neuritis, unspecified: Secondary | ICD-10-CM

## 2019-03-25 DIAGNOSIS — G8929 Other chronic pain: Secondary | ICD-10-CM

## 2019-03-25 DIAGNOSIS — M47817 Spondylosis without myelopathy or radiculopathy, lumbosacral region: Secondary | ICD-10-CM

## 2019-03-25 DIAGNOSIS — M47816 Spondylosis without myelopathy or radiculopathy, lumbar region: Secondary | ICD-10-CM

## 2019-03-25 DIAGNOSIS — M79605 Pain in left leg: Secondary | ICD-10-CM

## 2019-03-25 DIAGNOSIS — M79604 Pain in right leg: Secondary | ICD-10-CM

## 2019-03-25 DIAGNOSIS — M545 Low back pain: Secondary | ICD-10-CM

## 2019-03-25 DIAGNOSIS — R937 Abnormal findings on diagnostic imaging of other parts of musculoskeletal system: Secondary | ICD-10-CM

## 2019-03-25 MED ORDER — MAGNESIUM OXIDE -MG SUPPLEMENT 500 MG PO CAPS: 1.0000 | ORAL_CAPSULE | Freq: Two times a day (BID) | ORAL | 5 refills | Status: DC

## 2019-03-25 MED ORDER — PREGABALIN 50 MG PO CAPS: 50.0000 mg | ORAL_CAPSULE | Freq: Three times a day (TID) | ORAL | 1 refills | Status: DC

## 2019-03-26 ENCOUNTER — Other Ambulatory Visit: Payer: Self-pay

## 2019-03-26 ENCOUNTER — Other Ambulatory Visit
Admission: RE | Admit: 2019-03-26 | Discharge: 2019-03-26 | Disposition: A | Discharge status: Home / Self Care | Payer: BLUE CROSS/BLUE SHIELD | Source: Ambulatory Visit | Attending: Gastroenterology | Admitting: Gastroenterology

## 2019-03-26 ENCOUNTER — Ambulatory Visit: Payer: BLUE CROSS/BLUE SHIELD | Admitting: Gastroenterology

## 2019-03-26 DIAGNOSIS — Z20828 Contact with and (suspected) exposure to other viral communicable diseases: Secondary | ICD-10-CM | POA: Diagnosis not present

## 2019-03-26 DIAGNOSIS — Z01812 Encounter for preprocedural laboratory examination: Secondary | ICD-10-CM | POA: Insufficient documentation

## 2019-03-27 ENCOUNTER — Telehealth: Payer: Self-pay

## 2019-03-27 LAB — SARS CORONAVIRUS 2 (TAT 6-24 HRS): SARS Coronavirus 2: NEGATIVE

## 2019-03-27 NOTE — Telephone Encounter (Signed)
Called patient to inform her that her medication has been approved. Patient states she got it last night.

## 2019-03-30 ENCOUNTER — Encounter: Payer: Self-pay | Admitting: *Deleted

## 2019-03-30 ENCOUNTER — Other Ambulatory Visit: Payer: Self-pay

## 2019-03-30 ENCOUNTER — Encounter
Admission: RE | Disposition: A | Discharge status: Home / Self Care | Payer: Self-pay | Source: Home / Self Care | Attending: Gastroenterology

## 2019-03-30 ENCOUNTER — Ambulatory Visit: Payer: BLUE CROSS/BLUE SHIELD | Admitting: Certified Registered Nurse Anesthetist

## 2019-03-30 ENCOUNTER — Ambulatory Visit
Admission: RE | Admit: 2019-03-30 | Discharge: 2019-03-30 | Disposition: A | Discharge status: Home / Self Care | Payer: BLUE CROSS/BLUE SHIELD | Attending: Gastroenterology | Admitting: Gastroenterology

## 2019-03-30 DIAGNOSIS — Z791 Long term (current) use of non-steroidal anti-inflammatories (NSAID): Secondary | ICD-10-CM | POA: Diagnosis not present

## 2019-03-30 DIAGNOSIS — R1310 Dysphagia, unspecified: Secondary | ICD-10-CM | POA: Diagnosis not present

## 2019-03-30 DIAGNOSIS — Z79899 Other long term (current) drug therapy: Secondary | ICD-10-CM | POA: Diagnosis not present

## 2019-03-30 DIAGNOSIS — K219 Gastro-esophageal reflux disease without esophagitis: Secondary | ICD-10-CM | POA: Insufficient documentation

## 2019-03-30 DIAGNOSIS — Z803 Family history of malignant neoplasm of breast: Secondary | ICD-10-CM | POA: Insufficient documentation

## 2019-03-30 DIAGNOSIS — K644 Residual hemorrhoidal skin tags: Secondary | ICD-10-CM | POA: Insufficient documentation

## 2019-03-30 DIAGNOSIS — Z800 Family history of malignant neoplasm of digestive organs: Secondary | ICD-10-CM | POA: Diagnosis not present

## 2019-03-30 DIAGNOSIS — E669 Obesity, unspecified: Secondary | ICD-10-CM | POA: Diagnosis not present

## 2019-03-30 DIAGNOSIS — K3189 Other diseases of stomach and duodenum: Secondary | ICD-10-CM | POA: Diagnosis not present

## 2019-03-30 DIAGNOSIS — Z888 Allergy status to other drugs, medicaments and biological substances status: Secondary | ICD-10-CM | POA: Diagnosis not present

## 2019-03-30 DIAGNOSIS — Z8249 Family history of ischemic heart disease and other diseases of the circulatory system: Secondary | ICD-10-CM | POA: Diagnosis not present

## 2019-03-30 DIAGNOSIS — Z833 Family history of diabetes mellitus: Secondary | ICD-10-CM | POA: Insufficient documentation

## 2019-03-30 DIAGNOSIS — Z91041 Radiographic dye allergy status: Secondary | ICD-10-CM | POA: Diagnosis not present

## 2019-03-30 DIAGNOSIS — R1314 Dysphagia, pharyngoesophageal phase: Secondary | ICD-10-CM | POA: Insufficient documentation

## 2019-03-30 DIAGNOSIS — Z6830 Body mass index (BMI) 30.0-30.9, adult: Secondary | ICD-10-CM | POA: Diagnosis not present

## 2019-03-30 DIAGNOSIS — E1142 Type 2 diabetes mellitus with diabetic polyneuropathy: Secondary | ICD-10-CM | POA: Diagnosis not present

## 2019-03-30 DIAGNOSIS — E782 Mixed hyperlipidemia: Secondary | ICD-10-CM | POA: Insufficient documentation

## 2019-03-30 DIAGNOSIS — M5430 Sciatica, unspecified side: Secondary | ICD-10-CM | POA: Insufficient documentation

## 2019-03-30 DIAGNOSIS — K2950 Unspecified chronic gastritis without bleeding: Secondary | ICD-10-CM | POA: Insufficient documentation

## 2019-03-30 DIAGNOSIS — Z90710 Acquired absence of both cervix and uterus: Secondary | ICD-10-CM | POA: Insufficient documentation

## 2019-03-30 DIAGNOSIS — I10 Essential (primary) hypertension: Secondary | ICD-10-CM | POA: Diagnosis not present

## 2019-03-30 DIAGNOSIS — K648 Other hemorrhoids: Secondary | ICD-10-CM | POA: Diagnosis not present

## 2019-03-30 DIAGNOSIS — Z91013 Allergy to seafood: Secondary | ICD-10-CM | POA: Insufficient documentation

## 2019-03-30 DIAGNOSIS — Z1211 Encounter for screening for malignant neoplasm of colon: Secondary | ICD-10-CM | POA: Insufficient documentation

## 2019-03-30 DIAGNOSIS — Z7984 Long term (current) use of oral hypoglycemic drugs: Secondary | ICD-10-CM | POA: Insufficient documentation

## 2019-03-30 DIAGNOSIS — M1990 Unspecified osteoarthritis, unspecified site: Secondary | ICD-10-CM | POA: Insufficient documentation

## 2019-03-30 DIAGNOSIS — Z841 Family history of disorders of kidney and ureter: Secondary | ICD-10-CM | POA: Insufficient documentation

## 2019-03-30 HISTORY — PX: COLONOSCOPY WITH PROPOFOL: SHX5780

## 2019-03-30 HISTORY — PX: ESOPHAGOGASTRODUODENOSCOPY (EGD) WITH PROPOFOL: SHX5813

## 2019-03-30 LAB — GLUCOSE, CAPILLARY: Glucose-Capillary: 68 mg/dL — ABNORMAL LOW (ref 70–99)

## 2019-03-30 SURGERY — ESOPHAGOGASTRODUODENOSCOPY (EGD) WITH PROPOFOL
Anesthesia: General

## 2019-03-30 MED ORDER — PROPOFOL 500 MG/50ML IV EMUL
INTRAVENOUS | Status: DC | PRN
  Administered 2019-03-30: 150 ug/kg/min via INTRAVENOUS

## 2019-03-30 MED ORDER — LIDOCAINE HCL (CARDIAC) PF 100 MG/5ML IV SOSY
PREFILLED_SYRINGE | INTRAVENOUS | Status: DC | PRN
  Administered 2019-03-30: 80 mg via INTRATRACHEAL

## 2019-03-30 MED ORDER — LIDOCAINE HCL (PF) 2 % IJ SOLN
INTRAMUSCULAR | Status: AC
  Filled 2019-03-30: qty 10

## 2019-03-30 MED ORDER — PHENYLEPHRINE HCL (PRESSORS) 10 MG/ML IV SOLN
INTRAVENOUS | Status: DC | PRN
  Administered 2019-03-30 (×2): 100 ug via INTRAVENOUS

## 2019-03-30 MED ORDER — SODIUM CHLORIDE 0.9 % IV SOLN
INTRAVENOUS | Status: DC
  Administered 2019-03-30: 10:00:00 via INTRAVENOUS

## 2019-03-30 MED ORDER — PROPOFOL 10 MG/ML IV BOLUS
INTRAVENOUS | Status: DC | PRN
  Administered 2019-03-30: 20 mg via INTRAVENOUS
  Administered 2019-03-30: 70 mg via INTRAVENOUS
  Administered 2019-03-30: 20 mg via INTRAVENOUS
  Administered 2019-03-30: 30 mg via INTRAVENOUS

## 2019-03-30 MED ORDER — PROPOFOL 500 MG/50ML IV EMUL
INTRAVENOUS | Status: AC
  Filled 2019-03-30: qty 50

## 2019-03-30 NOTE — Transfer of Care (Signed)
Immediate Anesthesia Transfer of Care Note  Patient: Lisa Galvan  Procedure(s) Performed: ESOPHAGOGASTRODUODENOSCOPY (EGD) WITH PROPOFOL (N/A ) COLONOSCOPY WITH PROPOFOL (N/A )  Patient Location: PACU  Anesthesia Type:General  Level of Consciousness: awake, alert  and oriented  Airway & Oxygen Therapy: Patient Spontanous Breathing  Post-op Assessment: Report given to RN and Post -op Vital signs reviewed and stable  Post vital signs: Reviewed and stable  Last Vitals:  Vitals Value Taken Time  BP 107/73 03/30/19 1036  Temp    Pulse 84 03/30/19 1036  Resp 23 03/30/19 1036  SpO2 100 % 03/30/19 1036  Vitals shown include unvalidated device data.  Last Pain:  Vitals:   03/30/19 0934  TempSrc: Temporal         Complications: No apparent anesthesia complications

## 2019-03-30 NOTE — Op Note (Signed)
[facility] Patient Name: Lisa Galvan Procedure Date: 03/30/2019 9:38 AM MRN: [id number] Account #: [id number] Date of Birth: May 26, 1960 Admit Type: Outpatient Age: 58 Room: [facility] ENDO ROOM 2 Gender: Female Note Status: Finalized Procedure:             Colonoscopy Indications:           Screening for colorectal malignant neoplasm Providers:             Lin Landsman MD, MD Medicines:             Monitored Anesthesia Care Complications:         No immediate complications. Estimated blood loss: None. Procedure:             Pre-Anesthesia Assessment:                        - Prior to the procedure, a History and Physical was                         performed, and patient medications and allergies were                         reviewed. The patient is competent. The risks and                         benefits of the procedure and the sedation options and                         risks were discussed with the patient. All questions                         were answered and informed consent was obtained.                         Patient identification and proposed procedure were                         verified by the physician, the nurse, the                         anesthesiologist, the anesthetist and the technician                         in the pre-procedure area in the procedure room in the                         endoscopy suite. Mental Status Examination: alert and                         oriented. Airway Examination: normal oropharyngeal                         airway and neck mobility. Respiratory Examination:                         clear to auscultation. CV Examination: normal.                         Prophylactic Antibiotics: The patient does not  require                         prophylactic antibiotics. Prior Anticoagulants: The                         patient has taken no previous anticoagulant or                         antiplatelet  agents. ASA Grade Assessment: II - A                         patient with mild systemic disease. After reviewing                         the risks and benefits, the patient was deemed in                         satisfactory condition to undergo the procedure. The                         anesthesia plan was to use monitored anesthesia care                         (MAC). Immediately prior to administration of                         medications, the patient was re-assessed for adequacy                         to receive sedatives. The heart rate, respiratory                         rate, oxygen saturations, blood pressure, adequacy of                         pulmonary ventilation, and response to care were                         monitored throughout the procedure. The physical                         status of the patient was re-assessed after the                         procedure.                        After obtaining informed consent, the colonoscope was                         passed under direct vision. Throughout the procedure,                         the patient's blood pressure, pulse, and oxygen                         saturations were monitored continuously. The  Colonoscope was introduced through the anus and                         advanced to the the terminal ileum, with                         identification of the appendiceal orifice and IC                         valve. The colonoscopy was performed without                         difficulty. The patient tolerated the procedure well.                         The quality of the bowel preparation was evaluated                         using the BBPS [facility] Bowel Preparation Scale) with                         scores of: Right Colon = 3, Transverse Colon = 3 and                         Left Colon = 3 (entire mucosa seen well with no                         residual staining, small fragments of stool or  opaque                         liquid). The total BBPS score equals 9. Findings:      The perianal and digital rectal examinations were normal. Pertinent       negatives include normal sphincter tone and no palpable rectal lesions.      The terminal ileum appeared normal.      The entire examined colon appeared normal.      Non-bleeding external hemorrhoids were found during retroflexion. The       hemorrhoids were medium-sized. Impression:            - The examined portion of the ileum was normal.                        - The entire examined colon is normal.                        - Non-bleeding external hemorrhoids.                        - No specimens collected. Recommendation:        - Discharge patient to home (with escort).                        - Resume previous diet today.                        - Continue present medications.                        -  Repeat colonoscopy in 10 years for surveillance. Procedure Code(s):     --- Professional ---                        Q6578, Colorectal cancer screening; colonoscopy on                         individual not meeting criteria for high risk Diagnosis Code(s):     --- Professional ---                        Z12.11, Encounter for screening for malignant neoplasm                         of colon                        K64.4, Residual hemorrhoidal skin tags CPT copyright 2019 American Medical Association. All rights reserved. The codes documented in this report are preliminary and upon coder review may  be revised to meet current compliance requirements. Dr. Ulyess Mort Lin Landsman MD, MD 03/30/2019 10:33:46 AM This report has been signed electronically. Number of Addenda: 0 Note Initiated On: 03/30/2019 9:38 AM Scope Withdrawal Time: 0 hours 7 minutes 39 seconds  Total Procedure Duration: 0 hours 12 minutes 54 seconds  Estimated Blood Loss:  Estimated blood loss: none.      [facility]

## 2019-03-30 NOTE — Op Note (Signed)
[facility] Patient Name: Lisa Galvan Procedure Date: 03/30/2019 9:39 AM MRN: [id number] Account #: [id number] Date of Birth: [birth year] Admit Type: Outpatient Age: 58 Room: [facility] ENDO ROOM 2 Gender: Female Note Status: Finalized Procedure:             Upper GI endoscopy Indications:           Esophageal dysphagia, Previously treated for                         Helicobacter pylori Providers:             Lin Landsman MD, MD Referring MD:          [name] (Referring MD) Medicines:             Monitored Anesthesia Care Complications:         No immediate complications. Estimated blood loss: None. Procedure:             Pre-Anesthesia Assessment:                        - Prior to the procedure, a History and Physical was                         performed, and patient medications and allergies were                         reviewed. The patient is competent. The risks and                         benefits of the procedure and the sedation options and                         risks were discussed with the patient. All questions                         were answered and informed consent was obtained.                         Patient identification and proposed procedure were                         verified by the physician, the nurse, the                         anesthesiologist, the anesthetist and the technician                         in the pre-procedure area in the procedure room in the                         endoscopy suite. Mental Status Examination: alert and                         oriented. Airway Examination: normal oropharyngeal                         airway and neck mobility. Respiratory Examination:  clear to auscultation. CV Examination: normal.                         Prophylactic Antibiotics: The patient does not require                         prophylactic antibiotics. Prior Anticoagulants: The                     patient has taken no previous anticoagulant or                         antiplatelet agents. ASA Grade Assessment: II - A                         patient with mild systemic disease. After reviewing                         the risks and benefits, the patient was deemed in                         satisfactory condition to undergo the procedure. The                         anesthesia plan was to use monitored anesthesia care                         (MAC). Immediately prior to administration of                         medications, the patient was re-assessed for adequacy                         to receive sedatives. The heart rate, respiratory                         rate, oxygen saturations, blood pressure, adequacy of                         pulmonary ventilation, and response to care were                         monitored throughout the procedure. The physical                         status of the patient was re-assessed after the                         procedure.                        After obtaining informed consent, the endoscope was                         passed under direct vision. Throughout the procedure,                         the patient's blood pressure, pulse, and oxygen  saturations were monitored continuously. The Endoscope                         was introduced through the mouth, and advanced to the                         second part of duodenum. The upper GI endoscopy was                         accomplished without difficulty. The patient tolerated                         the procedure well. Findings:      The duodenal bulb and second portion of the duodenum were normal.      Striped mildly erythematous mucosa without bleeding was found in the       gastric antrum. Biopsies were taken with a cold forceps for Helicobacter       pylori testing.      The cardia, gastric fundus, gastric body, incisura and prepyloric region       of  the stomach were normal. Biopsies were taken with a cold forceps for       Helicobacter pylori testing.      The cardia and gastric fundus were normal on retroflexion.      Esophagogastric landmarks were identified: the gastroesophageal junction       was found at 35 cm from the incisors.      The gastroesophageal junction and examined esophagus were normal. Impression:            - Normal duodenal bulb and second portion of the                         duodenum.                        - Erythematous mucosa in the antrum. Biopsied.                        - Normal cardia, gastric fundus, gastric body,                         incisura and prepyloric region of the stomach.                         Biopsied.                        - Esophagogastric landmarks identified.                        - Normal gastroesophageal junction and esophagus. Recommendation:        - Await pathology results.                        - Continue present medications.                        - Proceed with colonoscopy as scheduled                        See colonoscopy report Procedure Code(s):     ---  Professional ---                        [phone], Esophagogastroduodenoscopy, flexible,                         transoral; with biopsy, single or multiple Diagnosis Code(s):     --- Professional ---                        K31.89, Other diseases of stomach and duodenum                        R13.14, Dysphagia, pharyngoesophageal phase CPT copyright 2019 American Medical Association. All rights reserved. The codes documented in this report are preliminary and upon coder review may  be revised to meet current compliance requirements. Dr. Ulyess Mort Lin Landsman MD, MD 03/30/2019 10:16:02 AM This report has been signed electronically. Number of Addenda: 0 Note Initiated On: 03/30/2019 9:39 AM Estimated Blood Loss:  Estimated blood loss: none.      [facility]

## 2019-03-30 NOTE — Anesthesia Post-op Follow-up Note (Signed)
Anesthesia QCDR form completed.        

## 2019-03-30 NOTE — Anesthesia Preprocedure Evaluation (Addendum)
Anesthesia Evaluation  Patient identified by MRN, date of birth, ID band Patient awake    Reviewed: Allergy & Precautions, H&P , NPO status , Patient's Chart, lab work & pertinent test results  History of Anesthesia Complications (+) history of anesthetic complications ("woke up during last colonoscopy")  Airway Mallampati: III  TM Distance: >3 FB     Dental  (+) Chipped   Pulmonary neg COPD, Not current smoker,           Cardiovascular hypertension,      Neuro/Psych Diabetic peripheral neuropathy negative psych ROS   GI/Hepatic Neg liver ROS, GERD  Controlled,  Endo/Other  diabetesMorbid obesity  Renal/GU negative Renal ROS  negative genitourinary   Musculoskeletal   Abdominal   Peds  Hematology negative hematology ROS (+)   Anesthesia Other Findings Past Medical History: No date: Allergy No date: Arthritis     Comment:  spine No date: Bilateral leg edema 12/15/2014: Chest pain 12/10/2014: Chronic sciatica 12/10/2014: Chronic sciatica No date: Diabetes mellitus without complication ([place]) 2/42/6834: Essential hypertension 12/10/2014: Essential hypertension No date: Gastroesophageal reflux disease without esophagitis No date: GERD (gastroesophageal reflux disease) No date: Hyperlipidemia No date: Hypertension No date: Mixed hyperlipidemia 12/10/2014: Obesity (BMI 30.0-34.9) 12/10/2014: Type 2 diabetes mellitus without complication ([place])  Past Surgical History: No date: ABDOMINAL HYSTERECTOMY 03/12/2017: ESOPHAGOGASTRODUODENOSCOPY (EGD) WITH PROPOFOL; N/A     Comment:  Procedure: ESOPHAGOGASTRODUODENOSCOPY (EGD) WITH               PROPOFOL;  Surgeon: Lin Landsman, MD;  Location:               [facility];  Service: Gastroenterology;  Laterality:               N/A; No date: HERNIA REPAIR 05/01/6220: UMBILICAL HERNIA REPAIR; N/A     Comment:  Procedure: HERNIA REPAIR UMBILICAL ADULT;  Surgeon:                Vickie Epley, MD;  Location: [facility];  Service:               General;  Laterality: N/A;     Reproductive/Obstetrics negative OB ROS                            Anesthesia Physical Anesthesia Plan  ASA: II  Anesthesia Plan: General   Post-op Pain Management:    Induction:   PONV Risk Score and Plan: Propofol infusion and TIVA  Airway Management Planned: Natural Airway and Nasal Cannula  Additional Equipment:   Intra-op Plan:   Post-operative Plan:   Informed Consent: I have reviewed the patients History and Physical, chart, labs and discussed the procedure including the risks, benefits and alternatives for the proposed anesthesia with the patient or authorized representative who has indicated his/her understanding and acceptance.     Dental Advisory Given  Plan Discussed with: Anesthesiologist  Anesthesia Plan Comments:         Anesthesia Quick Evaluation

## 2019-03-30 NOTE — H&P (Signed)
Cephas Darby, MD [address]  [place]  [place], [place] [address]  Main: [phone]  Fax: [phone] Pager: [phone]  Primary Care Physician:  [name], DO Primary Gastroenterologist:  Dr. Cephas Darby  Pre-Procedure History & Physical: HPI:  Lisa Galvan is a 58 y.o. female is here for an endoscopy and colonoscopy.   Past Medical History:  Diagnosis Date  . Allergy   . Arthritis    spine  . Bilateral leg edema   . Chest pain 12/15/2014  . Chronic sciatica 12/10/2014  . Chronic sciatica 12/10/2014  . Diabetes mellitus without complication ([place])   . Essential hypertension 12/10/2014  . Essential hypertension 12/10/2014  . Gastroesophageal reflux disease without esophagitis   . GERD (gastroesophageal reflux disease)   . Hyperlipidemia   . Hypertension   . Mixed hyperlipidemia   . Obesity (BMI 30.0-34.9) 12/10/2014  . Type 2 diabetes mellitus without complication ([place]) 3/81/0175    Past Surgical History:  Procedure Laterality Date  . ABDOMINAL HYSTERECTOMY    . ESOPHAGOGASTRODUODENOSCOPY (EGD) WITH PROPOFOL N/A 03/12/2017   Procedure: ESOPHAGOGASTRODUODENOSCOPY (EGD) WITH PROPOFOL;  Surgeon: Lin Landsman, MD;  Location: [facility];  Service: Gastroenterology;  Laterality: N/A;  . HERNIA REPAIR    . UMBILICAL HERNIA REPAIR N/A 05/01/2017   Procedure: HERNIA REPAIR UMBILICAL ADULT;  Surgeon: Vickie Epley, MD;  Location: [facility];  Service: General;  Laterality: N/A;    Prior to Admission medications   Medication Sig Start Date End Date Taking? Authorizing Provider  amLODipine (NORVASC) 5 MG tablet TAKE 1 TABLET(5 MG) BY MOUTH DAILY 10/06/18  Yes Johnson, Megan P, DO  atorvastatin (LIPITOR) 20 MG tablet Take 1 tablet (20 mg total) by mouth daily. 10/06/18  Yes Johnson, Megan P, DO  Cholecalciferol (VITAMIN D-3) 5000 units TABS Take 5,000 Units by mouth daily.   Yes [provider]  DULoxetine (CYMBALTA) 60 MG capsule Take 60 mg by  mouth daily.   Yes [provider]  empagliflozin (JARDIANCE) 10 MG TABS tablet Take 10 mg by mouth daily. 10/06/18  Yes Johnson, Megan P, DO  fexofenadine (ALLEGRA) 180 MG tablet TAKE 1 TABLET BY MOUTH EVERY DAY 03/17/19  Yes Johnson, Megan P, DO  metFORMIN (GLUCOPHAGE) 500 MG tablet Take 1 tablet (500 mg total) by mouth 2 (two) times daily with a meal. 10/06/18  Yes Johnson, Megan P, DO  omeprazole (PRILOSEC) 40 MG capsule Take 1 capsule (40 mg total) by mouth daily before breakfast. 02/10/19 03/30/19 Yes Vanga, Tally Due, MD  pregabalin (LYRICA) 50 MG capsule Take 1 capsule (50 mg total) by mouth 3 (three) times daily. 03/25/19 05/24/19 Yes Milinda Pointer, MD  EPINEPHrine (EPIPEN 2-PAK) 0.3 mg/0.3 mL IJ SOAJ injection Inject 0.3 mLs (0.3 mg total) into the muscle once. Take for severe allergic reaction, then come immediately to the Emergency Department or call 911. 03/24/15   Hinda Kehr, MD  ibuprofen (ADVIL) 800 MG tablet TAKE 1 TABLET(800 MG) BY MOUTH EVERY 8 HOURS AS NEEDED 10/06/18   Johnson, Megan P, DO  ketoconazole (NIZORAL) 2 % cream Apply 1 application topically daily. 10/06/18   Park Liter P, DO  Magnesium Oxide 500 MG CAPS Take 1 capsule (500 mg total) by mouth 2 (two) times daily at 8 am and 10 pm. 03/25/19 09/21/19  Milinda Pointer, MD  montelukast (SINGULAIR) 10 MG tablet Take 1 tablet (10 mg total) by mouth at bedtime. 10/06/18   Johnson, Megan P, DO  nystatin (MYCOSTATIN) 100000 UNIT/ML  suspension Take 5 mLs (500,000 Units total) by mouth 4 (four) times daily. 01/02/19   Particia Nearing, PA-C  Olopatadine HCl 0.2 % SOLN 1 drop to each eye daily 10/06/18   Olevia Perches P, DO  pantoprazole (PROTONIX) 40 MG tablet TAKE 1 TABLET BY MOUTH TWICE DAILY Patient not taking: Reported on 03/30/2019 10/06/18   Olevia Perches P, DO  pioglitazone (ACTOS) 30 MG tablet TAKE 1 TABLET(30 MG) BY MOUTH DAILY 10/06/18   Johnson, Megan P, DO  valACYclovir (VALTREX) 500 MG tablet Take  500 mg by mouth 2 (two) times daily as needed (for outbreak).     [provider]    Allergies as of 03/13/2019 - Review Complete 03/11/2019  Allergen Reaction Noted  . Amlodipine besy-benazepril hcl Anaphylaxis 07/04/2015  . Ivp dye [iodinated diagnostic agents] Anaphylaxis 12/02/2018  . Shellfish allergy Swelling and Anaphylaxis 12/15/2014  . Shellfish-derived products Anaphylaxis 12/15/2014  . Ace inhibitors Swelling 06/05/2016    Family History  Problem Relation Age of Onset  . Diabetes Mother   . Cancer Mother        breast and stomach  . Breast cancer Mother 38  . Heart disease Father   . Kidney disease Father        dialysis  . Cancer Father        colon  . Diabetes Father   . Hypertension Sister   . Hypertension Brother   . Congestive Heart Failure Maternal Grandmother   . Diabetes Maternal Grandmother   . Hypertension Maternal Grandmother     Social History   Socioeconomic History  . Marital status: Widowed    Spouse name: Not on file  . Number of children: Not on file  . Years of education: Not on file  . Highest education level: Not on file  Occupational History  . Not on file  Social Needs  . Financial resource strain: Not on file  . Food insecurity    Worry: Not on file    Inability: Not on file  . Transportation needs    Medical: Not on file    Non-medical: Not on file  Tobacco Use  . Smoking status: Never Smoker  . Smokeless tobacco: Never Used  Substance and Sexual Activity  . Alcohol use: Yes    Alcohol/week: 2.0 standard drinks    Types: 2 Glasses of wine per week    Comment: rare wine  . Drug use: No  . Sexual activity: Not Currently  Lifestyle  . Physical activity    Days per week: Not on file    Minutes per session: Not on file  . Stress: Not on file  Relationships  . Social Musician on phone: Not on file    Gets together: Not on file    Attends religious service: Not on file    Active member of club or  organization: Not on file    Attends meetings of clubs or organizations: Not on file    Relationship status: Not on file  . Intimate partner violence    Fear of current or ex partner: Not on file    Emotionally abused: Not on file    Physically abused: Not on file    Forced sexual activity: Not on file  Other Topics Concern  . Not on file  Social History Narrative  . Not on file    Review of Systems: See HPI, otherwise negative ROS  Physical Exam: BP 119/88   Pulse 95  Temp (!) 96.8 F (36 C) (Temporal)   Resp 16   Ht 5\' 3"  (1.6 m)   Wt 77.1 kg   SpO2 99%   BMI 30.11 kg/m  General:   Alert,  pleasant and cooperative in NAD Head:  Normocephalic and atraumatic. Neck:  Supple; no masses or thyromegaly. Lungs:  Clear throughout to auscultation.    Heart:  Regular rate and rhythm. Abdomen:  Soft, nontender and nondistended. Normal bowel sounds, without guarding, and without rebound.   Neurologic:  Alert and  oriented x4;  grossly normal neurologically.  Impression/Plan: Maxcine HamSharon Nygaard is here for an endoscopy and colonoscopy to be performed for colon cancer screening and evaluate for esophageal candidiasis, h/o dysphagia  Risks, benefits, limitations, and alternatives regarding  endoscopy and colonoscopy have been reviewed with the patient.  Questions have been answered.  All parties agreeable.   Lannette Donathohini Vanga, MD  03/30/2019, 10:01 AM

## 2019-03-31 ENCOUNTER — Encounter: Payer: Self-pay | Admitting: Gastroenterology

## 2019-03-31 NOTE — Anesthesia Postprocedure Evaluation (Signed)
Anesthesia Post Note  Patient: Lisa Galvan  Procedure(s) Performed: ESOPHAGOGASTRODUODENOSCOPY (EGD) WITH PROPOFOL (N/A ) COLONOSCOPY WITH PROPOFOL (N/A )  Patient location during evaluation: PACU Anesthesia Type: General Level of consciousness: awake and alert Pain management: pain level controlled Vital Signs Assessment: post-procedure vital signs reviewed and stable Respiratory status: spontaneous breathing, nonlabored ventilation and respiratory function stable Cardiovascular status: blood pressure returned to baseline and stable Postop Assessment: no apparent nausea or vomiting Anesthetic complications: no     Last Vitals:  Vitals:   03/30/19 1050 03/30/19 1055  BP:  119/82  Pulse: 85 87  Resp: (!) 22 19  Temp:    SpO2: 100% 98%    Last Pain:  Vitals:   03/31/19 0734  TempSrc:   PainSc: 0-No pain                 Durenda Hurt

## 2019-04-02 ENCOUNTER — Ambulatory Visit
Admission: RE | Admit: 2019-04-02 | Discharge: 2019-04-02 | Disposition: A | Discharge status: Home / Self Care | Payer: BLUE CROSS/BLUE SHIELD | Source: Ambulatory Visit | Attending: Pain Medicine | Admitting: Pain Medicine

## 2019-04-02 ENCOUNTER — Encounter: Payer: Self-pay | Admitting: Pain Medicine

## 2019-04-02 ENCOUNTER — Ambulatory Visit (HOSPITAL_BASED_OUTPATIENT_CLINIC_OR_DEPARTMENT_OTHER): Payer: BLUE CROSS/BLUE SHIELD | Admitting: Pain Medicine

## 2019-04-02 ENCOUNTER — Other Ambulatory Visit: Payer: Self-pay

## 2019-04-02 VITALS — BP 123/63 | HR 85 | Temp 97.1°F | Resp 16 | Ht 63.0 in | Wt 170.0 lb

## 2019-04-02 DIAGNOSIS — M4310 Spondylolisthesis, site unspecified: Secondary | ICD-10-CM

## 2019-04-02 DIAGNOSIS — M5136 Other intervertebral disc degeneration, lumbar region: Secondary | ICD-10-CM | POA: Diagnosis not present

## 2019-04-02 DIAGNOSIS — M47816 Spondylosis without myelopathy or radiculopathy, lumbar region: Secondary | ICD-10-CM

## 2019-04-02 DIAGNOSIS — M47817 Spondylosis without myelopathy or radiculopathy, lumbosacral region: Secondary | ICD-10-CM | POA: Insufficient documentation

## 2019-04-02 DIAGNOSIS — M51369 Other intervertebral disc degeneration, lumbar region without mention of lumbar back pain or lower extremity pain: Secondary | ICD-10-CM

## 2019-04-02 DIAGNOSIS — G8929 Other chronic pain: Secondary | ICD-10-CM | POA: Diagnosis not present

## 2019-04-02 DIAGNOSIS — M545 Low back pain: Secondary | ICD-10-CM | POA: Insufficient documentation

## 2019-04-02 LAB — SURGICAL PATHOLOGY

## 2019-04-02 MED ORDER — MIDAZOLAM HCL 5 MG/5ML IJ SOLN
1.0000 mg | INTRAMUSCULAR | Status: AC | PRN
  Administered 2019-04-02: 3 mg via INTRAVENOUS
  Filled 2019-04-02: qty 5

## 2019-04-02 MED ORDER — TRIAMCINOLONE ACETONIDE 40 MG/ML IJ SUSP
80.0000 mg | Freq: Once | INTRAMUSCULAR | Status: AC
  Administered 2019-04-02: 40 mg
  Filled 2019-04-02: qty 2

## 2019-04-02 MED ORDER — ROPIVACAINE HCL 2 MG/ML IJ SOLN
18.0000 mL | Freq: Once | INTRAMUSCULAR | Status: AC
  Administered 2019-04-02: 10 mL via PERINEURAL
  Filled 2019-04-02: qty 20

## 2019-04-02 MED ORDER — LACTATED RINGERS IV SOLN
1000.0000 mL | Freq: Once | INTRAVENOUS | Status: AC
  Administered 2019-04-02: 2000 mL via INTRAVENOUS

## 2019-04-02 MED ORDER — FENTANYL CITRATE (PF) 100 MCG/2ML IJ SOLN
25.0000 ug | INTRAMUSCULAR | Status: AC | PRN
  Administered 2019-04-02: 50 ug via INTRAVENOUS
  Filled 2019-04-02: qty 2

## 2019-04-02 MED ORDER — LIDOCAINE HCL 2 % IJ SOLN
20.0000 mL | Freq: Once | INTRAMUSCULAR | Status: AC
  Administered 2019-04-02: 400 mg
  Filled 2019-04-02: qty 20

## 2019-04-02 NOTE — Progress Notes (Deleted)
Patient's Name: Lisa Galvan  MRN: [id number]  Referring Provider: Dorcas Carrow, DO  DOB: June 16, 1960  PCP: Dorcas Carrow, DO  DOS: 04/02/2019  Note by: Oswaldo Done, MD  Service setting: Ambulatory outpatient  Specialty: Interventional Pain Management  Patient type: Established  Location: [facility] (AMB) Pain Management Facility  Visit type: Interventional Procedure   Primary Reason for Visit: Interventional Pain Management Treatment. CC: Back Pain (lower)  Procedure:          Anesthesia, Analgesia, Anxiolysis:  Type: Lumbar Facet, Medial Branch Block(s) #1  Primary Purpose: Diagnostic Region: Posterolateral Lumbosacral Spine Level: L2, L3, L4, L5, & S1 Medial Branch Level(s). Injecting these levels blocks the L3-4, L4-5, and L5-S1 lumbar facet joints. Laterality: Bilateral  Type: Moderate (Conscious) Sedation combined with Local Anesthesia Indication(s): Analgesia and Anxiety Route: Intravenous (IV) IV Access: Secured Sedation: Meaningful verbal contact was maintained at all times during the procedure  Local Anesthetic: Lidocaine 1-2%  Position: Prone   Indications: 1. Lumbar facet syndrome (Bilateral)   2. Spondylosis without myelopathy or radiculopathy, lumbosacral region   3. Lumbar Facet Hypertrophy   4. DDD (degenerative disc disease), lumbar   5. Grade 1 Anterolisthesis of L4/5   6. Chronic low back pain (Third area of Pain) (Bilateral) w/o sciatica    Pain Score: Pre-procedure: 7 /10 Post-procedure: 0-No pain/10   Pre-op Assessment:  Lisa Galvan is a 58 y.o. (year old), female patient, seen today for interventional treatment. She  has a past surgical history that includes Abdominal hysterectomy; Esophagogastroduodenoscopy (egd) with propofol (N/A, 03/12/2017); Umbilical hernia repair (N/A, 05/01/2017); Hernia repair; Esophagogastroduodenoscopy (egd) with propofol (N/A, 03/30/2019); and Colonoscopy with propofol (N/A, 03/30/2019). Lisa Galvan has a current medication list  which includes the following prescription(s): amlodipine, atorvastatin, vitamin d-3, duloxetine, jardiance, epinephrine, fexofenadine, ibuprofen, ketoconazole, magnesium oxide, metformin, montelukast, nystatin, olopatadine hcl, pioglitazone, pregabalin, valacyclovir, and omeprazole, and the following Facility-Administered Medications: fentanyl and midazolam. Her primarily concern today is the Back Pain (lower)  Initial Vital Signs:  Pulse/HCG Rate: 85ECG Heart Rate: 89 Temp: (!) 97.1 F (36.2 C) Resp: 16 BP: 121/85 SpO2: 96 %  BMI: Estimated body mass index is 30.11 kg/m as calculated from the following:   Height as of this encounter: 5\' 3"  (1.6 m).   Weight as of this encounter: 170 lb (77.1 kg).  Risk Assessment: Allergies: Reviewed. She is allergic to amlodipine besy-benazepril hcl; ivp dye [iodinated diagnostic agents]; shellfish allergy; shellfish-derived products; and ace inhibitors.  Allergy Precautions: None required Coagulopathies: Reviewed. None identified.  Blood-thinner therapy: None at this time Active Infection(s): Reviewed. None identified. Lisa Galvan is afebrile  Site Confirmation: Lisa Galvan was asked to confirm the procedure and laterality before marking the site Procedure checklist: Completed Consent: Before the procedure and under the influence of no sedative(s), amnesic(s), or anxiolytics, the patient was informed of the treatment options, risks and possible complications. To fulfill our ethical and legal obligations, as recommended by the American Medical Association's Code of Ethics, I have informed the patient of my clinical impression; the nature and purpose of the treatment or procedure; the risks, benefits, and possible complications of the intervention; the alternatives, including doing nothing; the risk(s) and benefit(s) of the alternative treatment(s) or procedure(s); and the risk(s) and benefit(s) of doing nothing. The patient was provided information about the  general risks and possible complications associated with the procedure. These may include, but are not limited to: failure to achieve desired goals, infection, bleeding, organ or nerve damage, allergic reactions, paralysis,  and death. In addition, the patient was informed of those risks and complications associated to Spine-related procedures, such as failure to decrease pain; infection (i.e.: Meningitis, epidural or intraspinal abscess); bleeding (i.e.: epidural hematoma, subarachnoid hemorrhage, or any other type of intraspinal or peri-dural bleeding); organ or nerve damage (i.e.: Any type of peripheral nerve, nerve root, or spinal cord injury) with subsequent damage to sensory, motor, and/or autonomic systems, resulting in permanent pain, numbness, and/or weakness of one or several areas of the body; allergic reactions; (i.e.: anaphylactic reaction); and/or death. Furthermore, the patient was informed of those risks and complications associated with the medications. These include, but are not limited to: allergic reactions (i.e.: anaphylactic or anaphylactoid reaction(s)); adrenal axis suppression; blood sugar elevation that in diabetics may result in ketoacidosis or comma; water retention that in patients with history of congestive heart failure may result in shortness of breath, pulmonary edema, and decompensation with resultant heart failure; weight gain; swelling or edema; medication-induced neural toxicity; particulate matter embolism and blood vessel occlusion with resultant organ, and/or nervous system infarction; and/or aseptic necrosis of one or more joints. Finally, the patient was informed that Medicine is not an exact science; therefore, there is also the possibility of unforeseen or unpredictable risks and/or possible complications that may result in a catastrophic outcome. The patient indicated having understood very clearly. We have given the patient no guarantees and we have made no promises.  Enough time was given to the patient to ask questions, all of which were answered to the patient's satisfaction. Lisa Galvan has indicated that she wanted to continue with the procedure. Attestation: I, the ordering provider, attest that I have discussed with the patient the benefits, risks, side-effects, alternatives, likelihood of achieving goals, and potential problems during recovery for the procedure that I have provided informed consent. Date   Time: 04/02/2019  8:15 AM  Pre-Procedure Preparation:  Monitoring: As per clinic protocol. Respiration, ETCO2, SpO2, BP, heart rate and rhythm monitor placed and checked for adequate function Safety Precautions: Patient was assessed for positional comfort and pressure points before starting the procedure. Time-out: I initiated and conducted the "Time-out" before starting the procedure, as per protocol. The patient was asked to participate by confirming the accuracy of the "Time Out" information. Verification of the correct person, site, and procedure were performed and confirmed by me, the nursing staff, and the patient. "Time-out" conducted as per Joint Commission's Universal Protocol (UP.01.01.01). Time: 7322  Description of Procedure:          Laterality: Bilateral. The procedure was performed in identical fashion on both sides. Levels:  L2, L3, L4, L5, & S1 Medial Branch Level(s) Area Prepped: Posterior Lumbosacral Region Prepping solution: DuraPrep (Iodine Povacrylex [0.7% available iodine] and Isopropyl Alcohol, 74% w/w) Safety Precautions: Aspiration looking for blood return was conducted prior to all injections. At no point did we inject any substances, as a needle was being advanced. Before injecting, the patient was told to immediately notify me if she was experiencing any new onset of "ringing in the ears, or metallic taste in the mouth". No attempts were made at seeking any paresthesias. Safe injection practices and needle disposal techniques  used. Medications properly checked for expiration dates. SDV (single dose vial) medications used. After the completion of the procedure, all disposable equipment used was discarded in the proper designated medical waste containers. Local Anesthesia: Protocol guidelines were followed. The patient was positioned over the fluoroscopy table. The area was prepped in the usual manner. The time-out was  completed. The target area was identified using fluoroscopy. A 12-in long, straight, sterile hemostat was used with fluoroscopic guidance to locate the targets for each level blocked. Once located, the skin was marked with an approved surgical skin marker. Once all sites were marked, the skin (epidermis, dermis, and hypodermis), as well as deeper tissues (fat, connective tissue and muscle) were infiltrated with a small amount of a short-acting local anesthetic, loaded on a 10cc syringe with a 25G, 1.5-in  Needle. An appropriate amount of time was allowed for local anesthetics to take effect before proceeding to the next step. Local Anesthetic: Lidocaine 2.0% The unused portion of the local anesthetic was discarded in the proper designated containers. Technical explanation of process:  L2 Medial Branch Nerve Block (MBB): The target area for the L2 medial branch is at the junction of the postero-lateral aspect of the superior articular process and the superior, posterior, and medial edge of the transverse process of L3. Under fluoroscopic guidance, a Quincke needle was inserted until contact was made with os over the superior postero-lateral aspect of the pedicular shadow (target area). After negative aspiration for blood, 0.5 mL of the nerve block solution was injected without difficulty or complication. The needle was removed intact. L3 Medial Branch Nerve Block (MBB): The target area for the L3 medial branch is at the junction of the postero-lateral aspect of the superior articular process and the superior, posterior,  and medial edge of the transverse process of L4. Under fluoroscopic guidance, a Quincke needle was inserted until contact was made with os over the superior postero-lateral aspect of the pedicular shadow (target area). After negative aspiration for blood, 0.5 mL of the nerve block solution was injected without difficulty or complication. The needle was removed intact. L4 Medial Branch Nerve Block (MBB): The target area for the L4 medial branch is at the junction of the postero-lateral aspect of the superior articular process and the superior, posterior, and medial edge of the transverse process of L5. Under fluoroscopic guidance, a Quincke needle was inserted until contact was made with os over the superior postero-lateral aspect of the pedicular shadow (target area). After negative aspiration for blood, 0.5 mL of the nerve block solution was injected without difficulty or complication. The needle was removed intact. L5 Medial Branch Nerve Block (MBB): The target area for the L5 medial branch is at the junction of the postero-lateral aspect of the superior articular process and the superior, posterior, and medial edge of the sacral ala. Under fluoroscopic guidance, a Quincke needle was inserted until contact was made with os over the superior postero-lateral aspect of the pedicular shadow (target area). After negative aspiration for blood, 0.5 mL of the nerve block solution was injected without difficulty or complication. The needle was removed intact. S1 Medial Branch Nerve Block (MBB): The target area for the S1 medial branch is at the posterior and inferior 6 o'clock position of the L5-S1 facet joint. Under fluoroscopic guidance, the Quincke needle inserted for the L5 MBB was redirected until contact was made with os over the inferior and postero aspect of the sacrum, at the 6 o' clock position under the L5-S1 facet joint (Target area). After negative aspiration for blood, 0.5 mL of the nerve block solution  was injected without difficulty or complication. The needle was removed intact.  Nerve block solution: 0.2% PF-Ropivacaine + Triamcinolone (40 mg/mL) diluted to a final concentration of 4 mg of Triamcinolone/mL of Ropivacaine The unused portion of the solution was discarded in the  proper designated containers. Procedural Needles: 22-gauge, 3.5-inch, Quincke needles used for all levels.  Once the entire procedure was completed, the treated area was cleaned, making sure to leave some of the prepping solution back to take advantage of its long term bactericidal properties.   Illustration of the posterior view of the lumbar spine and the posterior neural structures. Laminae of L2 through S1 are labeled. DPRL5, dorsal primary ramus of L5; DPRS1, dorsal primary ramus of S1; DPR3, dorsal primary ramus of L3; FJ, facet (zygapophyseal) joint L3-L4; I, inferior articular process of L4; LB1, lateral branch of dorsal primary ramus of L1; IAB, inferior articular branches from L3 medial branch (supplies L4-L5 facet joint); IBP, intermediate branch plexus; MB3, medial branch of dorsal primary ramus of L3; NR3, third lumbar nerve root; S, superior articular process of L5; SAB, superior articular branches from L4 (supplies L4-5 facet joint also); TP3, transverse process of L3.  Vitals:   04/02/19 0915 04/02/19 0922 04/02/19 0932 04/02/19 0942  BP: 121/74 132/77 132/83 123/63  Pulse:      Resp: Temp:      SpO2: 98% 98% 98% 99%  Weight:      Height:         Start Time: 0859 hrs. End Time: 0912 hrs.  Imaging Guidance (Spinal):          Type of Imaging Technique: Fluoroscopy Guidance (Spinal) Indication(s): Assistance in needle guidance and placement for procedures requiring needle placement in or near specific anatomical locations not easily accessible without such assistance. Exposure Time: Please see nurses notes. Contrast: None used. Fluoroscopic Guidance: I was personally present during the  use of fluoroscopy. "Tunnel Vision Technique" used to obtain the best possible view of the target area. Parallax error corrected before commencing the procedure. "Direction-depth-direction" technique used to introduce the needle under continuous pulsed fluoroscopy. Once target was reached, antero-posterior, oblique, and lateral fluoroscopic projection used confirm needle placement in all planes. Images permanently stored in EMR. Interpretation: No contrast injected. I personally interpreted the imaging intraoperatively. Adequate needle placement confirmed in multiple planes. Permanent images saved into the patient's record.  Antibiotic Prophylaxis:   Anti-infectives (From admission, onward)   None     Indication(s): None identified  Post-operative Assessment:  Post-procedure Vital Signs:  Pulse/HCG Rate: 8587 Temp: (!) 97.1 F (36.2 C) Resp: 16 BP: 123/63 SpO2: 99 %  EBL: None  Complications: No immediate post-treatment complications observed by team, or reported by patient.  Note: The patient tolerated the entire procedure well. A repeat set of vitals were taken after the procedure and the patient was kept under observation following institutional policy, for this type of procedure. Post-procedural neurological assessment was performed, showing return to baseline, prior to discharge. The patient was provided with post-procedure discharge instructions, including a section on how to identify potential problems. Should any problems arise concerning this procedure, the patient was given instructions to immediately contact us, at any time, without hesitation. In any case, we plan to contact the patient by telephone for a follow-up status report regarding this interventional procedure.  Comments:  No additional relevant information.  Plan of Care  Orders:  Orders Placed This Encounter  Procedures   L-FCT Blk (Today)    Scheduling Instructions:     Procedure: Lumbar facet block (AKA.:  Lumbosacral medial branch nerve block)     Side: Bilateral     Level: L3-4, L4-5, & L5-S1 Facets (L2, L3, L4, L5, & S1 Medial Branch Nerves)  Sedation: Patient's choice.     Timeframe: Today    Order Specific Question:   Where will this procedure be performed?    Answer:   [facility]   Fluoro (C-Arm) (<60 min) (No Report)    Intraoperative interpretation by procedural physician at [facility].    Standing Status:   Standing    Number of Occurrences:   1    Order Specific Question:   Reason for exam:    Answer:   Assistance in needle guidance and placement for procedures requiring needle placement in or near specific anatomical locations not easily accessible without such assistance.   Consent: L-FCT BLK    Nursing Order: Transcribe to consent form and obtain patient signature. Note: Always confirm laterality of pain with Ms. Richardson Dopp, before procedure. Procedure: Lumbar Facet Block  under fluoroscopic guidance Indication/Reason: Low Back Pain, with our without leg pain, due to Facet Joint Arthralgia (Joint Pain) known as Lumbar Facet Syndrome, secondary to Lumbar, and/or Lumbosacral Spondylosis (Arthritis of the Spine), without myelopathy or radiculopathy (Nerve Damage). Provider Attestation: I, Shanti Eichel A. Laban Emperor, MD, (Pain Management Specialist), the physician/practitioner, attest that I have discussed with the patient the benefits, risks, side effects, alternatives, likelihood of achieving goals and potential problems during recovery for the procedure that I have provided informed consent.   Block Tray    Equipment required: Single use, disposable, "Block Tray"    Standing Status:   Standing    Number of Occurrences:   1    Order Specific Question:   Specify    Answer:   Block Tray   Chronic Opioid Analgesic:  No opioid analgesics prescribed by our practice.   Medications ordered for procedure: Meds ordered this encounter  Medications   lidocaine  (XYLOCAINE) 2 % (with pres) injection 400 mg   lactated ringers infusion 1,000 mL   midazolam (VERSED) 5 MG/5ML injection 1-2 mg    Make sure Flumazenil is available in the pyxis when using this medication. If oversedation occurs, administer 0.2 mg IV over 15 sec. If after 45 sec no response, administer 0.2 mg again over 1 min; may repeat at 1 min intervals; not to exceed 4 doses (1 mg)   fentaNYL (SUBLIMAZE) injection 25-50 mcg    Make sure Narcan is available in the pyxis when using this medication. In the event of respiratory depression (RR< 8/min): Titrate NARCAN (naloxone) in increments of 0.1 to 0.2 mg IV at 2-3 minute intervals, until desired degree of reversal.   ropivacaine (PF) 2 mg/mL (0.2%) (NAROPIN) injection 18 mL   triamcinolone acetonide (KENALOG-40) injection 80 mg   Medications administered: We administered lidocaine, lactated ringers, midazolam, fentaNYL, ropivacaine (PF) 2 mg/mL (0.2%), and triamcinolone acetonide.  See the medical record for exact dosing, route, and time of administration.  Follow-up plan:   Return in about 2 weeks (around 04/16/2019) for (VV), (PP).       Interventional management options: Planned, scheduled, and/or pending:    Diagnostic bilateral lumbar facet block #1    Considering:   EMG/PNCV of the lower extremities ordered today. Physical therapy referral for treatment of bilateral lower extremity weakness and lack of coordination, ordered today. Diagnostic bilateral lumbar sympathetic block  Diagnostic bilateral lumbar facet block  Possible bilateral lumbar facet RFA  Diagnostic bilateral IA hip joint injection  Possible bilateral obturator + femoral NB  Possible bilateral obturator + femoral nerve RFA  Diagnostic bilateral SI joint block  Possible bilateral SI joint RFA  PRN Procedures:   None at this time    Recent Visits Date Type Provider Dept  03/25/19 Telemedicine Delano MetzNaveira, Jacee Enerson, MD [facility]  03/11/19  Office Visit Delano MetzNaveira, Gelila Well, MD [facility]  Showing recent visits within past 90 days and meeting all other requirements   Today's Visits Date Type Provider Dept  04/02/19 Procedure visit Delano MetzNaveira, Conard Alvira, MD [facility]  Showing today's visits and meeting all other requirements   Future Appointments Date Type Provider Dept  04/14/19 Appointment Delano MetzNaveira, Malaysha Arlen, MD [facility]  05/25/19 Appointment Delano MetzNaveira, Monee Dembeck, MD [facility]  Showing future appointments within next 90 days and meeting all other requirements   Disposition: Discharge home  Discharge Date & Time: 04/02/2019; 0943 hrs.   Primary Care Physician: Dorcas CarrowJohnson, Megan P, DO [facility] Note by: Oswaldo DoneFrancisco A Maryna Yeagle, MD Date: 04/02/2019; Time: 11:06 AM  Disclaimer:  Medicine is not an Visual merchandiserexact science. The only guarantee in medicine is that nothing is guaranteed. It is important to note that the decision to proceed with this intervention was based on the information collected from the patient. The Data and conclusions were drawn from the patient's questionnaire, the interview, and the physical examination. Because the information was provided in large part by the patient, it cannot be guaranteed that it has not been purposely or unconsciously manipulated. Every effort has been made to obtain as much relevant data as possible for this evaluation. It is important to note that the conclusions that lead to this procedure are derived in large part from the available data. Always take into account that the treatment will also be dependent on availability of resources and existing treatment guidelines, considered by other Pain Management Practitioners as being common knowledge and practice, at the time of the intervention. For Medico-Legal purposes, it is also important to point out that variation in procedural techniques and pharmacological choices are  the acceptable norm. The indications, contraindications, technique, and results of the above procedure should only be interpreted and judged by a Board-Certified Interventional Pain Specialist with extensive familiarity and expertise in the same exact procedure and technique.

## 2019-04-02 NOTE — Patient Instructions (Addendum)
____________________________________________________________________________________________  Post-Procedure Discharge Instructions  Instructions:  Apply ice:   Purpose: This will minimize any swelling and discomfort after procedure.   When: Day of procedure, as soon as you get home.  How: Fill a plastic sandwich bag with crushed ice. Cover it with a small towel and apply to injection site.  How long: (15 min on, 15 min off) Apply for 15 minutes then remove x 15 minutes.  Repeat sequence on day of procedure, until you go to bed.  Apply heat:   Purpose: To treat any soreness and discomfort from the procedure.  When: Starting the next day after the procedure.  How: Apply heat to procedure site starting the day following the procedure.  How long: May continue to repeat daily, until discomfort goes away.  Food intake: Start with clear liquids (like water) and advance to regular food, as tolerated.   Physical activities: Keep activities to a minimum for the first 8 hours after the procedure. After that, then as tolerated.  Driving: If you have received any sedation, be responsible and do not drive. You are not allowed to drive for 24 hours after having sedation.  Blood thinner: (Applies only to those taking blood thinners) You may restart your blood thinner 6 hours after your procedure.  Insulin: (Applies only to Diabetic patients taking insulin) As soon as you can eat, you may resume your normal dosing schedule.  Infection prevention: Keep procedure site clean and dry. Shower daily and clean area with soap and water.  Post-procedure Pain Diary: Extremely important that this be done correctly and accurately. Recorded information will be used to determine the next step in treatment. For the purpose of accuracy, follow these rules:  Evaluate only the area treated. Do not report or include pain from an untreated area. For the purpose of this evaluation, ignore all other areas of pain,  except for the treated area.  After your procedure, avoid taking a long nap and attempting to complete the pain diary after you wake up. Instead, set your alarm clock to go off every hour, on the hour, for the initial 8 hours after the procedure. Document the duration of the numbing medicine, and the relief you are getting from it.  Do not go to sleep and attempt to complete it later. It will not be accurate. If you received sedation, it is likely that you were given a medication that may cause amnesia. Because of this, completing the diary at a later time may cause the information to be inaccurate. This information is needed to plan your care.  Follow-up appointment: Keep your post-procedure follow-up evaluation appointment after the procedure (usually 2 weeks for most procedures, 6 weeks for radiofrequencies). DO NOT FORGET to bring you pain diary with you.   Expect: (What should I expect to see with my procedure?)  From numbing medicine (AKA: Local Anesthetics): Numbness or decrease in pain. You may also experience some weakness, which if present, could last for the duration of the local anesthetic.  Onset: Full effect within 15 minutes of injected.  Duration: It will depend on the type of local anesthetic used. On the average, 1 to 8 hours.   From steroids (Applies only if steroids were used): Decrease in swelling or inflammation. Once inflammation is improved, relief of the pain will follow.  Onset of benefits: Depends on the amount of swelling present. The more swelling, the longer it will take for the benefits to be seen. In some cases, up to 10 days.    Duration: Steroids will stay in the system x 2 weeks. Duration of benefits will depend on multiple posibilities including persistent irritating factors.  Side-effects: If present, they may typically last 2 weeks (the duration of the steroids).  Frequent: Cramps (if they occur, drink Gatorade and take over-the-counter Magnesium 450-500 mg  once to twice a day); water retention with temporary weight gain; increases in blood sugar; decreased immune system response; increased appetite.  Occasional: Facial flushing (red, warm cheeks); mood swings; menstrual changes.  Uncommon: Long-term decrease or suppression of natural hormones; bone thinning. (These are more common with higher doses or more frequent use. This is why we prefer that our patients avoid having any injection therapies in other practices.)   Very Rare: Severe mood changes; psychosis; aseptic necrosis.  From procedure: Some discomfort is to be expected once the numbing medicine wears off. This should be minimal if ice and heat are applied as instructed.  Call if: (When should I call?)  You experience numbness and weakness that gets worse with time, as opposed to wearing off.  New onset bowel or bladder incontinence. (Applies only to procedures done in the spine)  Emergency Numbers:  Durning business hours (Monday - Thursday, 8:00 AM - 4:00 PM) (Friday, 9:00 AM - 12:00 Noon): [phone]  After hours: [phone]  NOTE: If you are having a problem and are unable connect with, or to talk to a provider, then go to your nearest urgent care or emergency department. If the problem is serious and urgent, please call 911. ____________________________________________________________________________________________   Facet Joint Block The facet joints connect the bones of the spine (vertebrae). They make it possible for you to bend, twist, and make other movements with your spine. They also keep you from bending too far, twisting too far, and making other extreme movements. A facet joint block is a procedure in which a numbing medicine (anesthetic) is injected into a facet joint. In many cases, an anti-inflammatory medicine (steroid) is also injected. A facet joint block may be done:  To diagnose neck or back pain. If the pain gets better after a facet joint block,  it means the pain is probably coming from the facet joint. If the pain does not get better, it means the pain is probably not coming from the facet joint.  To relieve neck or back pain that is caused by an inflamed facet joint. A facet joint block is only done to relieve pain if the pain does not improve with other methods, such as medicine, exercise programs, and physical therapy. Tell a health care provider about:  Any allergies you have.  All medicines you are taking, including vitamins, herbs, eye drops, creams, and over-the-counter medicines.  Any problems you or family members have had with anesthetic medicines.  Any blood disorders you have.  Any surgeries you have had.  Any medical conditions you have or have had.  Whether you are pregnant or may be pregnant. What are the risks? Generally, this is a safe procedure. However, problems may occur, including:  Bleeding.  Injury to a nerve near the injection site.  Pain at the injection site.  Weakness or numbness in areas controlled by nerves near the injection site.  Infection.  Temporary fluid retention.  Allergic reactions to medicines or dyes.  Injury to other structures or organs near the injection site. What happens before the procedure? Medicines Ask your health care provider about:  Changing or stopping your regular medicines. This is especially important if you   are taking diabetes medicines or blood thinners.  Taking medicines such as aspirin and ibuprofen. These medicines can thin your blood. Do not take these medicines unless your health care provider tells you to take them.  Taking over-the-counter medicines, vitamins, herbs, and supplements. Eating and drinking Follow instructions from your health care provider about eating and drinking, which may include:  8 hours before the procedure - stop eating heavy meals or foods, such as meat, fried foods, or fatty foods.  6 hours before the procedure - stop  eating light meals or foods, such as toast or cereal.  6 hours before the procedure - stop drinking milk or drinks that contain milk.  2 hours before the procedure - stop drinking clear liquids. Staying hydrated Follow instructions from your health care provider about hydration, which may include:  Up to 2 hours before the procedure - you may continue to drink clear liquids, such as water, clear fruit juice, black coffee, and plain tea. General instructions  Do not use any products that contain nicotine or tobacco for at least 4-6 weeks before the procedure. These products include cigarettes, e-cigarettes, and chewing tobacco. If you need help quitting, ask your health care provider.  Plan to have someone take you home from the hospital or clinic.  Ask your health care provider: ? How your surgery site will be marked. ? What steps will be taken to help prevent infection. These may include:  Removing hair at the surgery site.  Washing skin with a germ-killing soap.  Receiving antibiotic medicine. What happens during the procedure?   You will put on a hospital gown.  You will lie on your stomach on an X-ray table. You may be asked to lie in a different position if an injection will be made in your neck.  Machines will be used to monitor your oxygen levels, heart rate, and blood pressure.  Your skin will be cleaned.  If an injection will be made in your neck, an IV will be inserted into one of your veins. Fluids and medicine will flow directly into your body through the IV.  A numbing medicine (local anesthetic) will be applied to your skin. Your skin may sting or burn for a moment.  A video X-ray machine (fluoroscopy) will be used to find the joint. In some cases, a CT scan may be used.  A contrast dye may be injected into the facet joint area to help find the joint.  When the joint is located, an anesthetic will be injected into the joint through the needle.  Your health  care provider will ask you whether you feel pain relief. ? If you feel relief, a steroid may be injected to provide pain relief for a longer period of time. ? If you do not feel relief or feel only partial relief, additional injections of an anesthetic may be made in other facet joints.  The needle will be removed.  Your skin will be cleaned.  A bandage (dressing) will be applied over each injection site. The procedure may vary among health care providers and hospitals. What happens after the procedure?  Your blood pressure, heart rate, breathing rate, and blood oxygen level will be monitored until you leave the hospital or clinic.  You will lie down and rest for a period of time. Summary  A facet joint block is a procedure in which a numbing medicine (anesthetic) is injected into a facet joint. An anti-inflammatory medicine (stereoid) may also be injected.  Follow   Follow instructions from your health care provider about medicines and eating and drinking before the procedure.  Do not use any products that contain nicotine or tobacco for at least 4-6 weeks before the procedure.  You will lie on your stomach for the procedure, but you may be asked to lie in a different position if an injection will be made in your neck.  When the joint is located, an anesthetic will be injected into the joint through the needle. This information is not intended to replace advice given to you by your health care provider. Make sure you discuss any questions you have with your health care provider. Document Released: 08/29/2006 Document Revised: 07/31/2018 Document Reviewed: 03/14/2018 [employer] Patient Education  2020 [employer].

## 2019-04-02 NOTE — Progress Notes (Signed)
Safety precautions to be maintained throughout the outpatient stay will include: orient to surroundings, keep bed in low position, maintain call bell within reach at all times, provide assistance with transfer out of bed and ambulation.  

## 2019-04-02 NOTE — Progress Notes (Signed)
Patient's Name: Lisa Galvan  MRN: [id number]  Referring Provider: Dorcas Carrow, DO  DOB: Dec 07, 1960  PCP: Dorcas Carrow, DO  DOS: 04/02/2019  Note by: Oswaldo Done, MD  Service setting: Ambulatory outpatient  Specialty: Interventional Pain Management  Patient type: Established  Location: [facility] (AMB) Pain Management Facility  Visit type: Interventional Procedure   Primary Reason for Visit: Interventional Pain Management Treatment. CC: Back Pain (lower)  Procedure:          Anesthesia, Analgesia, Anxiolysis:  Type: Lumbar Facet, Medial Branch Block(s) #1  Primary Purpose: Diagnostic Region: Posterolateral Lumbosacral Spine Level: L2, L3, L4, L5, & S1 Medial Branch Level(s). Injecting these levels blocks the L3-4, L4-5, and L5-S1 lumbar facet joints. Laterality: Bilateral  Type: Moderate (Conscious) Sedation combined with Local Anesthesia Indication(s): Analgesia and Anxiety Route: Intravenous (IV) IV Access: Secured Sedation: Meaningful verbal contact was maintained at all times during the procedure  Local Anesthetic: Lidocaine 1-2%  Position: Prone   Indications: 1. Lumbar facet syndrome (Bilateral)   2. Spondylosis without myelopathy or radiculopathy, lumbosacral region   3. Lumbar Facet Hypertrophy   4. DDD (degenerative disc disease), lumbar   5. Grade 1 Anterolisthesis of L4/5   6. Chronic low back pain (Third area of Pain) (Bilateral) w/o sciatica    Pain Score: Pre-procedure: 7 /10 Post-procedure: 0-No pain/10   Pre-op Assessment:  Lisa Galvan is a 58 y.o. (year old), female patient, seen today for interventional treatment. She  has a past surgical history that includes Abdominal hysterectomy; Esophagogastroduodenoscopy (egd) with propofol (N/A, 03/12/2017); Umbilical hernia repair (N/A, 05/01/2017); Hernia repair; Esophagogastroduodenoscopy (egd) with propofol (N/A, 03/30/2019); and Colonoscopy with propofol (N/A, 03/30/2019). Lisa Galvan has a current medication list  which includes the following prescription(s): vitamin d-3, duloxetine, epinephrine, fexofenadine, ketoconazole, magnesium oxide, pregabalin, valacyclovir, amlodipine, atorvastatin, jardiance, hydrochlorothiazide, ibuprofen, metformin, montelukast, omeprazole, and pioglitazone. Her primarily concern today is the Back Pain (lower)  Initial Vital Signs:  Pulse/HCG Rate: 85ECG Heart Rate: 89 Temp: (!) 97.1 F (36.2 C) Resp: 16 BP: 121/85 SpO2: 96 %  BMI: Estimated body mass index is 30.11 kg/m as calculated from the following:   Height as of this encounter: 5\' 3"  (1.6 m).   Weight as of this encounter: 170 lb (77.1 kg).  Risk Assessment: Allergies: Reviewed. She is allergic to amlodipine besy-benazepril hcl; ivp dye [iodinated diagnostic agents]; shellfish allergy; shellfish-derived products; and ace inhibitors.  Allergy Precautions: None required Coagulopathies: Reviewed. None identified.  Blood-thinner therapy: None at this time Active Infection(s): Reviewed. None identified. Lisa Galvan is afebrile  Site Confirmation: Lisa Galvan was asked to confirm the procedure and laterality before marking the site Procedure checklist: Completed Consent: Before the procedure and under the influence of no sedative(s), amnesic(s), or anxiolytics, the patient was informed of the treatment options, risks and possible complications. To fulfill our ethical and legal obligations, as recommended by the American Medical Association's Code of Ethics, I have informed the patient of my clinical impression; the nature and purpose of the treatment or procedure; the risks, benefits, and possible complications of the intervention; the alternatives, including doing nothing; the risk(s) and benefit(s) of the alternative treatment(s) or procedure(s); and the risk(s) and benefit(s) of doing nothing. The patient was provided information about the general risks and possible complications associated with the procedure. These may  include, but are not limited to: failure to achieve desired goals, infection, bleeding, organ or nerve damage, allergic reactions, paralysis, and death. In addition, the patient was informed of those  risks and complications associated to Spine-related procedures, such as failure to decrease pain; infection (i.e.: Meningitis, epidural or intraspinal abscess); bleeding (i.e.: epidural hematoma, subarachnoid hemorrhage, or any other type of intraspinal or peri-dural bleeding); organ or nerve damage (i.e.: Any type of peripheral nerve, nerve root, or spinal cord injury) with subsequent damage to sensory, motor, and/or autonomic systems, resulting in permanent pain, numbness, and/or weakness of one or several areas of the body; allergic reactions; (i.e.: anaphylactic reaction); and/or death. Furthermore, the patient was informed of those risks and complications associated with the medications. These include, but are not limited to: allergic reactions (i.e.: anaphylactic or anaphylactoid reaction(s)); adrenal axis suppression; blood sugar elevation that in diabetics may result in ketoacidosis or comma; water retention that in patients with history of congestive heart failure may result in shortness of breath, pulmonary edema, and decompensation with resultant heart failure; weight gain; swelling or edema; medication-induced neural toxicity; particulate matter embolism and blood vessel occlusion with resultant organ, and/or nervous system infarction; and/or aseptic necrosis of one or more joints. Finally, the patient was informed that Medicine is not an exact science; therefore, there is also the possibility of unforeseen or unpredictable risks and/or possible complications that may result in a catastrophic outcome. The patient indicated having understood very clearly. We have given the patient no guarantees and we have made no promises. Enough time was given to the patient to ask questions, all of which were answered  to the patient's satisfaction. Lisa Galvan has indicated that she wanted to continue with the procedure. Attestation: I, the ordering provider, attest that I have discussed with the patient the benefits, risks, side-effects, alternatives, likelihood of achieving goals, and potential problems during recovery for the procedure that I have provided informed consent. Date   Time: 04/02/2019  8:15 AM  Pre-Procedure Preparation:  Monitoring: As per clinic protocol. Respiration, ETCO2, SpO2, BP, heart rate and rhythm monitor placed and checked for adequate function Safety Precautions: Patient was assessed for positional comfort and pressure points before starting the procedure. Time-out: I initiated and conducted the "Time-out" before starting the procedure, as per protocol. The patient was asked to participate by confirming the accuracy of the "Time Out" information. Verification of the correct person, site, and procedure were performed and confirmed by me, the nursing staff, and the patient. "Time-out" conducted as per Joint Commission's Universal Protocol (UP.01.01.01). Time: 1610  Description of Procedure:          Laterality: Bilateral. The procedure was performed in identical fashion on both sides. Levels:  L2, L3, L4, L5, & S1 Medial Branch Level(s) Area Prepped: Posterior Lumbosacral Region Prepping solution: DuraPrep (Iodine Povacrylex [0.7% available iodine] and Isopropyl Alcohol, 74% w/w) Safety Precautions: Aspiration looking for blood return was conducted prior to all injections. At no point did we inject any substances, as a needle was being advanced. Before injecting, the patient was told to immediately notify me if she was experiencing any new onset of "ringing in the ears, or metallic taste in the mouth". No attempts were made at seeking any paresthesias. Safe injection practices and needle disposal techniques used. Medications properly checked for expiration dates. SDV (single dose vial)  medications used. After the completion of the procedure, all disposable equipment used was discarded in the proper designated medical waste containers. Local Anesthesia: Protocol guidelines were followed. The patient was positioned over the fluoroscopy table. The area was prepped in the usual manner. The time-out was completed. The target area was identified using fluoroscopy. A 12-in  long, straight, sterile hemostat was used with fluoroscopic guidance to locate the targets for each level blocked. Once located, the skin was marked with an approved surgical skin marker. Once all sites were marked, the skin (epidermis, dermis, and hypodermis), as well as deeper tissues (fat, connective tissue and muscle) were infiltrated with a small amount of a short-acting local anesthetic, loaded on a 10cc syringe with a 25G, 1.5-in  Needle. An appropriate amount of time was allowed for local anesthetics to take effect before proceeding to the next step. Local Anesthetic: Lidocaine 2.0% The unused portion of the local anesthetic was discarded in the proper designated containers. Technical explanation of process:  L2 Medial Branch Nerve Block (MBB): The target area for the L2 medial branch is at the junction of the postero-lateral aspect of the superior articular process and the superior, posterior, and medial edge of the transverse process of L3. Under fluoroscopic guidance, a Quincke needle was inserted until contact was made with os over the superior postero-lateral aspect of the pedicular shadow (target area). After negative aspiration for blood, 0.5 mL of the nerve block solution was injected without difficulty or complication. The needle was removed intact. L3 Medial Branch Nerve Block (MBB): The target area for the L3 medial branch is at the junction of the postero-lateral aspect of the superior articular process and the superior, posterior, and medial edge of the transverse process of L4. Under fluoroscopic guidance, a  Quincke needle was inserted until contact was made with os over the superior postero-lateral aspect of the pedicular shadow (target area). After negative aspiration for blood, 0.5 mL of the nerve block solution was injected without difficulty or complication. The needle was removed intact. L4 Medial Branch Nerve Block (MBB): The target area for the L4 medial branch is at the junction of the postero-lateral aspect of the superior articular process and the superior, posterior, and medial edge of the transverse process of L5. Under fluoroscopic guidance, a Quincke needle was inserted until contact was made with os over the superior postero-lateral aspect of the pedicular shadow (target area). After negative aspiration for blood, 0.5 mL of the nerve block solution was injected without difficulty or complication. The needle was removed intact. L5 Medial Branch Nerve Block (MBB): The target area for the L5 medial branch is at the junction of the postero-lateral aspect of the superior articular process and the superior, posterior, and medial edge of the sacral ala. Under fluoroscopic guidance, a Quincke needle was inserted until contact was made with os over the superior postero-lateral aspect of the pedicular shadow (target area). After negative aspiration for blood, 0.5 mL of the nerve block solution was injected without difficulty or complication. The needle was removed intact. S1 Medial Branch Nerve Block (MBB): The target area for the S1 medial branch is at the posterior and inferior 6 o'clock position of the L5-S1 facet joint. Under fluoroscopic guidance, the Quincke needle inserted for the L5 MBB was redirected until contact was made with os over the inferior and postero aspect of the sacrum, at the 6 o' clock position under the L5-S1 facet joint (Target area). After negative aspiration for blood, 0.5 mL of the nerve block solution was injected without difficulty or complication. The needle was removed  intact.  Nerve block solution: 0.2% PF-Ropivacaine + Triamcinolone (40 mg/mL) diluted to a final concentration of 4 mg of Triamcinolone/mL of Ropivacaine The unused portion of the solution was discarded in the proper designated containers. Procedural Needles: 22-gauge, 3.5-inch, Quincke needles used  for all levels.  Once the entire procedure was completed, the treated area was cleaned, making sure to leave some of the prepping solution back to take advantage of its long term bactericidal properties.   Illustration of the posterior view of the lumbar spine and the posterior neural structures. Laminae of L2 through S1 are labeled. DPRL5, dorsal primary ramus of L5; DPRS1, dorsal primary ramus of S1; DPR3, dorsal primary ramus of L3; FJ, facet (zygapophyseal) joint L3-L4; I, inferior articular process of L4; LB1, lateral branch of dorsal primary ramus of L1; IAB, inferior articular branches from L3 medial branch (supplies L4-L5 facet joint); IBP, intermediate branch plexus; MB3, medial branch of dorsal primary ramus of L3; NR3, third lumbar nerve root; S, superior articular process of L5; SAB, superior articular branches from L4 (supplies L4-5 facet joint also); TP3, transverse process of L3.  Vitals:   04/02/19 0915 04/02/19 0922 04/02/19 0932 04/02/19 0942  BP: 121/74 132/77 132/83 123/63  Pulse:      Resp: Temp:      SpO2: 98% 98% 98% 99%  Weight:      Height:         Start Time: 0859 hrs. End Time: 0912 hrs.  Imaging Guidance (Spinal):          Type of Imaging Technique: Fluoroscopy Guidance (Spinal) Indication(s): Assistance in needle guidance and placement for procedures requiring needle placement in or near specific anatomical locations not easily accessible without such assistance. Exposure Time: Please see nurses notes. Contrast: None used. Fluoroscopic Guidance: I was personally present during the use of fluoroscopy. "Tunnel Vision Technique" used to obtain the best  possible view of the target area. Parallax error corrected before commencing the procedure. "Direction-depth-direction" technique used to introduce the needle under continuous pulsed fluoroscopy. Once target was reached, antero-posterior, oblique, and lateral fluoroscopic projection used confirm needle placement in all planes. Images permanently stored in EMR. Interpretation: No contrast injected. I personally interpreted the imaging intraoperatively. Adequate needle placement confirmed in multiple planes. Permanent images saved into the patient's record.  Antibiotic Prophylaxis:   Anti-infectives (From admission, onward)   None     Indication(s): None identified  Post-operative Assessment:  Post-procedure Vital Signs:  Pulse/HCG Rate: 8587 Temp: (!) 97.1 F (36.2 C) Resp: 16 BP: 123/63 SpO2: 99 %  EBL: None  Complications: No immediate post-treatment complications observed by team, or reported by patient.  Note: The patient tolerated the entire procedure well. A repeat set of vitals were taken after the procedure and the patient was kept under observation following institutional policy, for this type of procedure. Post-procedural neurological assessment was performed, showing return to baseline, prior to discharge. The patient was provided with post-procedure discharge instructions, including a section on how to identify potential problems. Should any problems arise concerning this procedure, the patient was given instructions to immediately contact us, at any time, without hesitation. In any case, we plan to contact the patient by telephone for a follow-up status report regarding this interventional procedure.  Comments:  No additional relevant information.  Plan of Care  Orders:  Orders Placed This Encounter  Procedures   L-FCT Blk (Today)    Scheduling Instructions:     Procedure: Lumbar facet block (AKA.: Lumbosacral medial branch nerve block)     Side: Bilateral     Level:  L3-4, L4-5, & L5-S1 Facets (L2, L3, L4, L5, & S1 Medial Branch Nerves)     Sedation: Patient's choice.  Timeframe: Today    Order Specific Question:   Where will this procedure be performed?    Answer:   [facility]   Fluoro (C-Arm) (<60 min) (No Report)    Intraoperative interpretation by procedural physician at [facility].    Standing Status:   Standing    Number of Occurrences:   1    Order Specific Question:   Reason for exam:    Answer:   Assistance in needle guidance and placement for procedures requiring needle placement in or near specific anatomical locations not easily accessible without such assistance.   Consent: L-FCT BLK    Nursing Order: Transcribe to consent form and obtain patient signature. Note: Always confirm laterality of pain with Ms. Richardson Doppole, before procedure. Procedure: Lumbar Facet Block  under fluoroscopic guidance Indication/Reason: Low Back Pain, with our without leg pain, due to Facet Joint Arthralgia (Joint Pain) known as Lumbar Facet Syndrome, secondary to Lumbar, and/or Lumbosacral Spondylosis (Arthritis of the Spine), without myelopathy or radiculopathy (Nerve Damage). Provider Attestation: I, Artice Holohan A. Laban EmperorNaveira, MD, (Pain Management Specialist), the physician/practitioner, attest that I have discussed with the patient the benefits, risks, side effects, alternatives, likelihood of achieving goals and potential problems during recovery for the procedure that I have provided informed consent.   Block Tray    Equipment required: Single use, disposable, "Block Tray"    Standing Status:   Standing    Number of Occurrences:   1    Order Specific Question:   Specify    Answer:   Block Tray   Chronic Opioid Analgesic:  No opioid analgesics prescribed by our practice.   Medications ordered for procedure: Meds ordered this encounter  Medications   lidocaine (XYLOCAINE) 2 % (with pres) injection 400 mg   lactated ringers infusion 1,000  mL   midazolam (VERSED) 5 MG/5ML injection 1-2 mg    Make sure Flumazenil is available in the pyxis when using this medication. If oversedation occurs, administer 0.2 mg IV over 15 sec. If after 45 sec no response, administer 0.2 mg again over 1 min; may repeat at 1 min intervals; not to exceed 4 doses (1 mg)   fentaNYL (SUBLIMAZE) injection 25-50 mcg    Make sure Narcan is available in the pyxis when using this medication. In the event of respiratory depression (RR< 8/min): Titrate NARCAN (naloxone) in increments of 0.1 to 0.2 mg IV at 2-3 minute intervals, until desired degree of reversal.   ropivacaine (PF) 2 mg/mL (0.2%) (NAROPIN) injection 18 mL   triamcinolone acetonide (KENALOG-40) injection 80 mg   Medications administered: We administered lidocaine, lactated ringers, midazolam, fentaNYL, ropivacaine (PF) 2 mg/mL (0.2%), and triamcinolone acetonide.  See the medical record for exact dosing, route, and time of administration.  Follow-up plan:   Return in about 2 weeks (around 04/16/2019) for (VV), (PP).       Interventional management options: Planned, scheduled, and/or pending:    Diagnostic bilateral lumbar facet block #1    Considering:   EMG/PNCV of the lower extremities ordered today. Physical therapy referral for treatment of bilateral lower extremity weakness and lack of coordination, ordered today. Diagnostic bilateral lumbar sympathetic block  Diagnostic bilateral lumbar facet block  Possible bilateral lumbar facet RFA  Diagnostic bilateral IA hip joint injection  Possible bilateral obturator + femoral NB  Possible bilateral obturator + femoral nerve RFA  Diagnostic bilateral SI joint block  Possible bilateral SI joint RFA    PRN Procedures:   None at this  time    Recent Visits Date Type Provider Dept  04/14/19 Telemedicine Milinda Pointer, MD [facility]  04/02/19 Procedure visit Milinda Pointer, MD [facility]  03/25/19  Telemedicine Milinda Pointer, [facility]  03/11/19 Office Visit Milinda Pointer, MD [facility]  Showing recent visits within past 90 days and meeting all other requirements   Future Appointments Date Type Provider Dept  05/25/19 Appointment Milinda Pointer, MD [facility]  Showing future appointments within next 90 days and meeting all other requirements   Disposition: Discharge home  Discharge Date & Time: 04/02/2019; 0943 hrs.   Primary Care Physician: [name], DO Location: [facility] Note by: Gaspar Cola, MD Date: 04/02/2019; Time: 9:10 AM  Disclaimer:  Medicine is not an Chief Strategy Officer. The only guarantee in medicine is that nothing is guaranteed. It is important to note that the decision to proceed with this intervention was based on the information collected from the patient. The Data and conclusions were drawn from the patient's questionnaire, the interview, and the physical examination. Because the information was provided in large part by the patient, it cannot be guaranteed that it has not been purposely or unconsciously manipulated. Every effort has been made to obtain as much relevant data as possible for this evaluation. It is important to note that the conclusions that lead to this procedure are derived in large part from the available data. Always take into account that the treatment will also be dependent on availability of resources and existing treatment guidelines, considered by other Pain Management Practitioners as being common knowledge and practice, at the time of the intervention. For Medico-Legal purposes, it is also important to point out that variation in procedural techniques and pharmacological choices are the acceptable norm. The indications, contraindications, technique, and results of the above procedure should only be interpreted and judged by a Board-Certified Interventional  Pain Specialist with extensive familiarity and expertise in the same exact procedure and technique.

## 2019-04-03 ENCOUNTER — Telehealth: Payer: Self-pay

## 2019-04-03 NOTE — Telephone Encounter (Signed)
Post procedure phone call.  Patient states she is doing well.  

## 2019-04-13 ENCOUNTER — Ambulatory Visit: Payer: BLUE CROSS/BLUE SHIELD | Admitting: Family Medicine

## 2019-04-13 ENCOUNTER — Encounter: Payer: Self-pay | Admitting: Family Medicine

## 2019-04-13 ENCOUNTER — Other Ambulatory Visit: Payer: Self-pay

## 2019-04-13 VITALS — BP 127/90 | HR 90 | Temp 97.8°F

## 2019-04-13 DIAGNOSIS — E785 Hyperlipidemia, unspecified: Secondary | ICD-10-CM

## 2019-04-13 DIAGNOSIS — I10 Essential (primary) hypertension: Secondary | ICD-10-CM

## 2019-04-13 DIAGNOSIS — E1140 Type 2 diabetes mellitus with diabetic neuropathy, unspecified: Secondary | ICD-10-CM | POA: Diagnosis not present

## 2019-04-13 DIAGNOSIS — K219 Gastro-esophageal reflux disease without esophagitis: Secondary | ICD-10-CM

## 2019-04-13 DIAGNOSIS — E559 Vitamin D deficiency, unspecified: Secondary | ICD-10-CM

## 2019-04-13 DIAGNOSIS — E119 Type 2 diabetes mellitus without complications: Secondary | ICD-10-CM

## 2019-04-13 MED ORDER — PIOGLITAZONE HCL 30 MG PO TABS: ORAL_TABLET | ORAL | 1 refills | Status: DC

## 2019-04-13 MED ORDER — JARDIANCE 10 MG PO TABS: 10.0000 mg | ORAL_TABLET | Freq: Every day | ORAL | 1 refills | Status: DC

## 2019-04-13 MED ORDER — OMEPRAZOLE 40 MG PO CPDR: 40.0000 mg | DELAYED_RELEASE_CAPSULE | Freq: Every day | ORAL | 1 refills | Status: DC

## 2019-04-13 MED ORDER — METFORMIN HCL 500 MG PO TABS: 500.0000 mg | ORAL_TABLET | Freq: Two times a day (BID) | ORAL | 1 refills | Status: DC

## 2019-04-13 MED ORDER — MONTELUKAST SODIUM 10 MG PO TABS: 10.0000 mg | ORAL_TABLET | Freq: Every day | ORAL | 1 refills | Status: DC

## 2019-04-13 MED ORDER — HYDROCHLOROTHIAZIDE 25 MG PO TABS: 25.0000 mg | ORAL_TABLET | Freq: Every day | ORAL | 3 refills | Status: DC

## 2019-04-13 MED ORDER — ATORVASTATIN CALCIUM 20 MG PO TABS: 20.0000 mg | ORAL_TABLET | Freq: Every day | ORAL | 1 refills | Status: DC

## 2019-04-13 MED ORDER — AMLODIPINE BESYLATE 5 MG PO TABS: ORAL_TABLET | ORAL | 1 refills | Status: DC

## 2019-04-13 MED ORDER — IBUPROFEN 800 MG PO TABS: ORAL_TABLET | ORAL | 1 refills | Status: DC

## 2019-04-13 NOTE — Progress Notes (Signed)
BP 127/90   Pulse 90   Temp 97.8 F (36.6 C)   SpO2 100%    Subjective:    Patient ID: Lisa Galvan, female    DOB: [birth year], 58 y.o.   MRN: [id number]  HPI: Lisa Galvan is a 58 y.o. female  Chief Complaint  Patient presents with  . Hypertension    Would like to restart Triamterene-HCTZ   HYPERTENSION / HYPERLIPIDEMIA- has not been taking her triamterene-HCTZ for about 6 months. Would like to go back on it.   Satisfied with current treatment? no Duration of hypertension: chronic BP monitoring frequency: a few times a week BP range: 120s/90s BP medication side effects: no Past BP meds: amlodipine Duration of hyperlipidemia: chronic Cholesterol medication side effects: no Cholesterol supplements: none Past cholesterol medications: atorvastatin Medication compliance: excellent compliance Aspirin: no Recent stressors: no Recurrent headaches: no Visual changes: no Palpitations: no Dyspnea: no Chest pain: no Lower extremity edema: no Dizzy/lightheaded: no  DIABETES Hypoglycemic episodes:no Polydipsia/polyuria: no Visual disturbance: no Chest pain: no Paresthesias: no Glucose Monitoring: yes  Accucheck frequency: Daily  Fasting glucose: 100 Taking Insulin?: no Blood Pressure Monitoring: a few times a week Retinal Examination: Up to Date Foot Exam: Up to Date Diabetic Education: Completed Pneumovax: Up to Date Influenza: Up to Date  Relevant past medical, surgical, family and social history reviewed and updated as indicated. Interim medical history since our last visit reviewed. Allergies and medications reviewed and updated.  Review of Systems  Constitutional: Negative.   Respiratory: Negative.   Cardiovascular: Negative.   Gastrointestinal: Negative.   Musculoskeletal: Positive for arthralgias, back pain and myalgias. Negative for gait problem, joint swelling, neck pain and neck stiffness.  Skin: Negative.   Neurological: Negative.     Psychiatric/Behavioral: Negative.     Per HPI unless specifically indicated above     Objective:    BP 127/90   Pulse 90   Temp 97.8 F (36.6 C)   SpO2 100%   Wt Readings from Last 3 Encounters:  04/02/19 170 lb (77.1 kg)  03/30/19 170 lb (77.1 kg)  03/11/19 172 lb (78 kg)    Physical Exam Vitals and nursing note reviewed.  Constitutional:      General: She is not in acute distress.    Appearance: Normal appearance. She is not ill-appearing, toxic-appearing or diaphoretic.  HENT:     Head: Normocephalic and atraumatic.     Right Ear: External ear normal.     Left Ear: External ear normal.     Nose: Nose normal.     Mouth/Throat:     Mouth: Mucous membranes are moist.     Pharynx: Oropharynx is clear.  Eyes:     General: No scleral icterus.       Right eye: No discharge.        Left eye: No discharge.     Extraocular Movements: Extraocular movements intact.     Conjunctiva/sclera: Conjunctivae normal.     Pupils: Pupils are equal, round, and reactive to light.  Cardiovascular:     Rate and Rhythm: Normal rate and regular rhythm.     Pulses: Normal pulses.     Heart sounds: Normal heart sounds. No murmur. No friction rub. No gallop.   Pulmonary:     Effort: Pulmonary effort is normal. No respiratory distress.     Breath sounds: Normal breath sounds. No stridor. No wheezing, rhonchi or rales.  Chest:     Chest wall: No tenderness.  Musculoskeletal:  General: Normal range of motion.     Cervical back: Normal range of motion and neck supple.  Skin:    General: Skin is warm and dry.     Capillary Refill: Capillary refill takes less than 2 seconds.     Coloration: Skin is not jaundiced or pale.     Findings: No bruising, erythema, lesion or rash.  Neurological:     General: No focal deficit present.     Mental Status: She is alert and oriented to person, place, and time. Mental status is at baseline.  Psychiatric:        Mood and Affect: Mood normal.         Behavior: Behavior normal.        Thought Content: Thought content normal.        Judgment: Judgment normal.       Assessment & Plan:   Problem List Items Addressed This Visit      Cardiovascular and Mediastinum   Essential hypertension - Primary    Diastolic running a little high. Would like to restart triamterene-HCTZ, but I think that will drop her too low. Will restart HCTZ and recheck 1 month.       Relevant Medications   hydrochlorothiazide (HYDRODIURIL) 25 MG tablet   atorvastatin (LIPITOR) 20 MG tablet   amLODipine (NORVASC) 5 MG tablet   Other Relevant Orders   CBC with Differential OUT   Comp Met (CMET)   Microalbumin, Urine Waived   TSH     Digestive   Gastroesophageal reflux disease without esophagitis    Under good control on current regimen. Continue current regimen. Continue to monitor. Call with any concerns. Refills given. Labs drawn today.       Relevant Medications   omeprazole (PRILOSEC) 40 MG capsule   Other Relevant Orders   CBC with Differential OUT   Comp Met (CMET)     Endocrine   Type 2 diabetes mellitus with diabetic neuropathy, unspecified ([place])    Under good control on current regimen with A1c of 5.9. Continue current regimen. Continue to monitor. Call with any concerns. Refills given. If this good in 6 months, we will stop actos.       Relevant Medications   pioglitazone (ACTOS) 30 MG tablet   metFORMIN (GLUCOPHAGE) 500 MG tablet   empagliflozin (JARDIANCE) 10 MG TABS tablet   atorvastatin (LIPITOR) 20 MG tablet   Other Relevant Orders   Bayer DCA Hb A1c Waived   CBC with Differential OUT   Comp Met (CMET)   Microalbumin, Urine Waived   UA/M w/rflx Culture, Routine     Other   Hyperlipidemia    Under good control on current regimen. Continue current regimen. Continue to monitor. Call with any concerns. Refills given. Labs drawn today.      Relevant Medications   hydrochlorothiazide (HYDRODIURIL) 25 MG tablet   atorvastatin  (LIPITOR) 20 MG tablet   amLODipine (NORVASC) 5 MG tablet   Other Relevant Orders   CBC with Differential OUT   Comp Met (CMET)   Lipid Panel w/o Chol/HDL Ratio OUT   Vitamin D deficiency    Checking labs today. Await results. Call with any concerns. Treat as needed.       Relevant Orders   CBC with Differential OUT   Comp Met (CMET)   Vit D  25 hydroxy (rtn osteoporosis monitoring)    Other Visit Diagnoses    Type 2 diabetes mellitus without complication, without long-term current use of insulin (  [place])       Relevant Medications   pioglitazone (ACTOS) 30 MG tablet   metFORMIN (GLUCOPHAGE) 500 MG tablet   empagliflozin (JARDIANCE) 10 MG TABS tablet   atorvastatin (LIPITOR) 20 MG tablet       Follow up plan: Return in about 4 weeks (around 05/11/2019) for BP follow up.

## 2019-04-13 NOTE — Progress Notes (Signed)
Pain relief after procedure (treated area only): (Questions asked to patient) 1. Starting about 15 minutes after the procedure, and "while the area was still numb" (from the local anesthetics), were you having any of your usual pain "in that area" (the treated area)?  (NOTE: NOT including the discomfort from the needle sticks.) First 1 hour: 20 % better. First 4-6 hours: 20% better.  3. How much better is your pain now, when compared to before the procedure? Current benefit:40 % better. 4. Can you move better now? Improvement in ROM (Range of Motion): Yes. 5. Can you do more now? Improvement in function: Yes. 4. Did you have any problems with the procedure? Side-effects/Complications: No.

## 2019-04-13 NOTE — Assessment & Plan Note (Signed)
Diastolic running a little high. Would like to restart triamterene-HCTZ, but I think that will drop her too low. Will restart HCTZ and recheck 1 month.

## 2019-04-13 NOTE — Assessment & Plan Note (Signed)
Under good control on current regimen. Continue current regimen. Continue to monitor. Call with any concerns. Refills given. Labs drawn today.   

## 2019-04-13 NOTE — Progress Notes (Signed)
Virtual Encounter - Pain Management PROVIDER NOTE: Information contained herein reflects review and annotations entered in association with encounter. Patient information is provided elsewhere in the medical record. Interpretation of information and data should be left to medically trained personnel. Document created using STT technology, any transcriptional errors that may result from process are unintentional.  Contact & Pharmacy Preferred: [phone] Home: [phone] (home) Mobile: [phone] (mobile) E-mail: [email or web address]  [facility] [place] [address] Phone: [phone] Fax: [phone]   Pre-screening  Lisa Galvan offered "in-person" vs "virtual" encounter. She indicated preferring virtual for this encounter.   Reason COVID-19*  Social distancing based on CDC and AMA recommendations.   I contacted Lisa Galvan on 04/14/2019 via telephone.      I clearly identified myself as Gaspar Cola, MD. I verified that I was speaking with the correct person using two identifiers (Name: Lisa Galvan, and date of birth: 18-Mar-1961).  Consent I sought verbal advanced consent from Lisa Galvan for virtual visit interactions. I informed Lisa Galvan of possible security and privacy concerns, risks, and limitations associated with providing "not-in-person" medical evaluation and management services. I also informed Lisa Galvan of the availability of "in-person" appointments. Finally, I informed her that there would be a charge for the virtual visit and that she could be  personally, fully or partially, financially responsible for it. Lisa Galvan expressed understanding and agreed to proceed.   Historic Elements   Lisa Galvan is a 58 y.o. year old, female patient evaluated today after her last encounter by our practice on 04/03/2019. Ms. Montelongo  has a past medical history of Allergy, Arthritis,  Bilateral leg edema, Chest pain (12/15/2014), Chronic sciatica (12/10/2014), Chronic sciatica (12/10/2014), Diabetes mellitus without complication ([place]), Essential hypertension (12/10/2014), Essential hypertension (12/10/2014), Gastroesophageal reflux disease without esophagitis, GERD (gastroesophageal reflux disease), Hyperlipidemia, Hypertension, Mixed hyperlipidemia, Obesity (BMI 30.0-34.9) (12/10/2014), and Type 2 diabetes mellitus without complication ([place]) (9/38/1017). She also  has a past surgical history that includes Abdominal hysterectomy; Esophagogastroduodenoscopy (egd) with propofol (N/A, 03/12/2017); Umbilical hernia repair (N/A, 05/01/2017); Hernia repair; Esophagogastroduodenoscopy (egd) with propofol (N/A, 03/30/2019); and Colonoscopy with propofol (N/A, 03/30/2019). Lisa Galvan has a current medication list which includes the following prescription(s): amlodipine, atorvastatin, vitamin d-3, duloxetine, jardiance, epinephrine, fexofenadine, hydrochlorothiazide, ibuprofen, ketoconazole, magnesium oxide, metformin, montelukast, omeprazole, pioglitazone, pregabalin, and valacyclovir. She  reports that she has never smoked. She has never used smokeless tobacco. She reports current alcohol use of about 2.0 standard drinks of alcohol per week. She reports that she does not use drugs. Ms. Diep is allergic to amlodipine besy-benazepril hcl; ivp dye [iodinated diagnostic agents]; shellfish allergy; shellfish-derived products; and ace inhibitors.   HPI  Today, she is being contacted for a post-procedure assessment.  According to the patient, while the numbing medicine was in place, she attained 100% relief of the pain on both sides of the lower back.  However, she did notice that on the left side the numbing medicine seems to have worn off faster (2 hours) then on the right (5 hours).  Interestingly enough, she indicates that prior to the procedure her pain was about a 7/10 and at this point it is a 3-4 over 10.  In  addition, she reports that the only area that seems not to have gotten better is a small area on the right side of the lower back and buttocks.  Today  I had the patient perform a Patrick maneuver at home and she reported pain in the lower back, compatible with sacroiliac joint pain.  At this point, the plan is to have her return and repeat the bilateral lumbar facet block in to compare it to the first 1.  In addition, when she comes in, I will repeat the physical exam and see if she has a component of sacroiliac joint pain and if so, we may go ahead and do a combined lumbar facet and SI joint block to determine if that works better in relieving some of her pain.  The plan was shared with the patient who understood and agreed with it.  Post-Procedure Evaluation  Procedure: Diagnostic bilateral lumbar facet block #1 under fluoroscopic guidance and IV sedation Pre-procedure pain level:  7/10 Post-procedure: 0/10 (100% relief)  Sedation: Sedation provided.  Valerie Salts, RN  04/13/2019 11:11 AM  Signed Pain relief after procedure (treated area only): (Questions asked to patient) 1. Starting about 15 minutes after the procedure, and "while the area was still numb" (from the local anesthetics), were you having any of your usual pain "in that area" (the treated area)?  (NOTE: NOT including the discomfort from the needle sticks.) First 1 hour: 100 % better. First 4-6 hours: 100% on the right & 40% on the left.  3. How much better is your pain now, when compared to before the procedure? Current benefit:40 % better. 4. Can you move better now? Improvement in ROM (Range of Motion): Yes. 5. Can you do more now? Improvement in function: Yes. 4. Did you have any problems with the procedure? Side-effects/Complications: No.  Current benefits: Defined as benefit that persist at this time.   Analgesia:  <50% better Function: Lisa Galvan reports improvement in function ROM: Lisa Galvan reports improvement in  ROM  Pharmacotherapy Assessment  Analgesic: No opioid analgesics prescribed by our practice.   Monitoring: Pharmacotherapy: No side-effects or adverse reactions reported. [place] PMP: PDMP reviewed during this encounter.       Compliance: No problems identified. Effectiveness: Clinically acceptable. Plan: Refer to "POC".  UDS:  Summary  Date Value Ref Range Status  03/11/2019 Note  Final    Comment:    ==================================================================== Compliance Drug Analysis, Ur ==================================================================== Test                             Result       Flag       Units Drug Present   Gabapentin                     PRESENT   Duloxetine                     PRESENT   Ibuprofen                      PRESENT ==================================================================== Test                      Result    Flag   Units      Ref Range   Creatinine              90               mg/dL      >=16 ==================================================================== Declared Medications:  Medication list was not provided. ==================================================================== For clinical consultation, please call [phone]. ====================================================================  Laboratory Chemistry Profile (12 mo)  Renal: 04/13/2019: BUN 10; BUN/Creatinine Ratio 11; Creatinine, Ser 0.91  Lab Results  Component Value Date   GFRAA 80 04/13/2019   GFRNONAA 69 04/13/2019   Hepatic: 04/13/2019: Albumin 4.4 Lab Results  Component Value Date   AST 14 04/13/2019   ALT 8 04/13/2019   Other: 02/10/2019: CRP <1 03/11/2019: Sed Rate 27 04/13/2019: Vit D, 25-Hydroxy 50.0 Note: Above Lab results reviewed.  Imaging  Fluoro (C-Arm) (<60 min) (No Report) Fluoro was used, but no Radiologist interpretation will be provided.  Please refer to "NOTES" tab for provider progress note.   Assessment   The primary encounter diagnosis was Lumbar facet syndrome (Bilateral). Diagnoses of Spondylosis without myelopathy or radiculopathy, lumbosacral region, Lumbar Facet Hypertrophy, DDD (degenerative disc disease), lumbar, Grade 1 Anterolisthesis of L4/5, Chronic low back pain (Third area of Pain) (Bilateral) w/o sciatica, Chronic feet pain (Primary Area of Pain) (Bilateral), Diabetic peripheral neuropathy (HCC), Chronic lower extremity pain (Secondary area of Pain) (Bilateral), Lower extremity weakness (Bilateral), and Chronic sacroiliac joint pain (Bilateral) were also pertinent to this visit.  Plan of Care  Problem-specific:  No problem-specific Assessment & Plan notes found for this encounter.  I am having Lisa Galvan maintain her EPINEPHrine, valACYclovir, Vitamin D-3, ketoconazole, DULoxetine, fexofenadine, Magnesium Oxide, pregabalin, hydrochlorothiazide, pioglitazone, omeprazole, montelukast, metFORMIN, ibuprofen, Jardiance, atorvastatin, and amLODipine.  Pharmacotherapy (Medications Ordered): No orders of the defined types were placed in this encounter.  Orders:  Orders Placed This Encounter  Procedures  . L-FCT Blk (Schedule)    Standing Status:   Future    Standing Expiration Date:   05/15/2019    Scheduling Instructions:     Procedure: Lumbar facet block (AKA.: Lumbosacral medial branch nerve block)     Side: Bilateral     Level: L3-4, L4-5, & L5-S1 Facets (L2, L3, L4, L5, & S1 Medial Branch Nerves)     Sedation: Patient's choice.     Timeframe: ASAA    Order Specific Question:   Where will this procedure be performed?    Answer:   [facility]  . S-I Blk (Schedule)    Standing Status:   Future    Standing Expiration Date:   05/15/2019    Scheduling Instructions:     Side: Right-sided     Sedation: Patient's choice.     Timeframe: ASAP    Order Specific Question:   Where will this procedure be performed?    Answer:   [facility]   Follow-up plan:   Return  for Procedure (w/ sedation): (B) L-FCT BLK #2 + (R) SI BLK #1.      Interventional management options: Planned, scheduled, and/or pending:    Diagnostic bilateral lumbar facet block #1    Considering:   EMG/PNCV of the lower extremities ordered today. Physical therapy referral for treatment of bilateral lower extremity weakness and lack of coordination, ordered today. Diagnostic bilateral lumbar sympathetic block  Diagnostic bilateral lumbar facet block  Possible bilateral lumbar facet RFA  Diagnostic bilateral IA hip joint injection  Possible bilateral obturator + femoral NB  Possible bilateral obturator + femoral nerve RFA  Diagnostic bilateral SI joint block  Possible bilateral SI joint RFA    PRN Procedures:   None at this time     Recent Visits Date Type Provider Dept  04/02/19 Procedure visit Delano MetzNaveira, Clydell Alberts, MD [facility]  03/25/19 Telemedicine Delano MetzNaveira, Kadynce Bonds, MD [facility]  03/11/19 Office Visit Delano MetzNaveira, Lajuanda Penick, MD [facility]  Showing recent visits within past 90 days and meeting all other requirements   Today's Visits Date Type Provider Dept  04/14/19 Telemedicine Delano Metz, MD [facility]  Showing today's visits and meeting all other requirements   Future Appointments Date Type Provider Dept  05/25/19 Appointment Delano Metz, MD [facility]  Showing future appointments within next 90 days and meeting all other requirements   I discussed the assessment and treatment plan with the patient. The patient was provided an opportunity to ask questions and all were answered. The patient agreed with the plan and demonstrated an understanding of the instructions.  Patient advised to call back or seek an in-person evaluation if the symptoms or condition worsens.  Total duration of non-face-to-face encounter: 20 minutes.  Note by: Oswaldo Done, MD Date: 04/14/2019; Time: 4:10 PM

## 2019-04-13 NOTE — Assessment & Plan Note (Signed)
Under good control on current regimen with A1c of 5.9. Continue current regimen. Continue to monitor. Call with any concerns. Refills given. If this good in 6 months, we will stop actos.

## 2019-04-13 NOTE — Assessment & Plan Note (Signed)
Checking labs today. Await results. Call with any concerns. Treat as needed.

## 2019-04-14 ENCOUNTER — Ambulatory Visit: Payer: BLUE CROSS/BLUE SHIELD | Attending: Pain Medicine | Admitting: Pain Medicine

## 2019-04-14 ENCOUNTER — Telehealth: Payer: Self-pay | Admitting: Family Medicine

## 2019-04-14 DIAGNOSIS — G8929 Other chronic pain: Secondary | ICD-10-CM

## 2019-04-14 DIAGNOSIS — R29898 Other symptoms and signs involving the musculoskeletal system: Secondary | ICD-10-CM

## 2019-04-14 DIAGNOSIS — M4310 Spondylolisthesis, site unspecified: Secondary | ICD-10-CM

## 2019-04-14 DIAGNOSIS — M79671 Pain in right foot: Secondary | ICD-10-CM

## 2019-04-14 DIAGNOSIS — M5136 Other intervertebral disc degeneration, lumbar region: Secondary | ICD-10-CM | POA: Diagnosis not present

## 2019-04-14 DIAGNOSIS — M79672 Pain in left foot: Secondary | ICD-10-CM

## 2019-04-14 DIAGNOSIS — M792 Neuralgia and neuritis, unspecified: Secondary | ICD-10-CM

## 2019-04-14 DIAGNOSIS — Z1231 Encounter for screening mammogram for malignant neoplasm of breast: Secondary | ICD-10-CM

## 2019-04-14 DIAGNOSIS — M545 Low back pain: Secondary | ICD-10-CM

## 2019-04-14 DIAGNOSIS — M47816 Spondylosis without myelopathy or radiculopathy, lumbar region: Secondary | ICD-10-CM | POA: Diagnosis not present

## 2019-04-14 DIAGNOSIS — M47817 Spondylosis without myelopathy or radiculopathy, lumbosacral region: Secondary | ICD-10-CM | POA: Diagnosis not present

## 2019-04-14 DIAGNOSIS — M79604 Pain in right leg: Secondary | ICD-10-CM

## 2019-04-14 DIAGNOSIS — M533 Sacrococcygeal disorders, not elsewhere classified: Secondary | ICD-10-CM

## 2019-04-14 DIAGNOSIS — E1142 Type 2 diabetes mellitus with diabetic polyneuropathy: Secondary | ICD-10-CM

## 2019-04-14 LAB — CBC WITH DIFFERENTIAL/PLATELET
Basophils Absolute: 0 10*3/uL (ref 0.0–0.2)
Basos: 0 %
EOS (ABSOLUTE): 0 10*3/uL (ref 0.0–0.4)
Eos: 0 %
Hematocrit: 38 % (ref 34.0–46.6)
Hemoglobin: 12.7 g/dL (ref 11.1–15.9)
Immature Grans (Abs): 0 10*3/uL (ref 0.0–0.1)
Immature Granulocytes: 0 %
Lymphocytes Absolute: 1.7 10*3/uL (ref 0.7–3.1)
Lymphs: 29 %
MCH: 29.9 pg (ref 26.6–33.0)
MCHC: 33.4 g/dL (ref 31.5–35.7)
MCV: 89 fL (ref 79–97)
Monocytes Absolute: 0.6 10*3/uL (ref 0.1–0.9)
Monocytes: 9 %
Neutrophils Absolute: 3.7 10*3/uL (ref 1.4–7.0)
Neutrophils: 62 %
Platelets: 271 10*3/uL (ref 150–450)
RBC: 4.25 x10E6/uL (ref 3.77–5.28)
RDW: 15.3 % (ref 11.7–15.4)
WBC: 6.1 10*3/uL (ref 3.4–10.8)

## 2019-04-14 LAB — COMPREHENSIVE METABOLIC PANEL
ALT: 8 IU/L (ref 0–32)
AST: 14 IU/L (ref 0–40)
Albumin/Globulin Ratio: 2 (ref 1.2–2.2)
Albumin: 4.4 g/dL (ref 3.8–4.9)
Alkaline Phosphatase: 63 IU/L (ref 39–117)
BUN/Creatinine Ratio: 11 (ref 9–23)
BUN: 10 mg/dL (ref 6–24)
Bilirubin Total: 0.7 mg/dL (ref 0.0–1.2)
CO2: 29 mmol/L (ref 20–29)
Calcium: 10.1 mg/dL (ref 8.7–10.2)
Chloride: 96 mmol/L (ref 96–106)
Creatinine, Ser: 0.91 mg/dL (ref 0.57–1.00)
GFR calc Af Amer: 80 mL/min/{1.73_m2} (ref >59)
GFR calc non Af Amer: 69 mL/min/{1.73_m2} (ref >59)
Globulin, Total: 2.2 g/dL (ref 1.5–4.5)
Glucose: 124 mg/dL — ABNORMAL HIGH (ref 65–99)
Potassium: 4 mmol/L (ref 3.5–5.2)
Sodium: 140 mmol/L (ref 134–144)
Total Protein: 6.6 g/dL (ref 6.0–8.5)

## 2019-04-14 LAB — LIPID PANEL W/O CHOL/HDL RATIO
Cholesterol, Total: 221 mg/dL — ABNORMAL HIGH (ref 100–199)
HDL: 133 mg/dL (ref >39)
LDL Chol Calc (NIH): 77 mg/dL (ref 0–99)
Triglycerides: 62 mg/dL (ref 0–149)
VLDL Cholesterol Cal: 11 mg/dL (ref 5–40)

## 2019-04-14 LAB — VITAMIN D 25 HYDROXY (VIT D DEFICIENCY, FRACTURES): Vit D, 25-Hydroxy: 50 ng/mL (ref 30.0–100.0)

## 2019-04-14 LAB — TSH: TSH: 0.78 u[IU]/mL (ref 0.450–4.500)

## 2019-04-14 NOTE — Patient Instructions (Signed)

## 2019-04-14 NOTE — Telephone Encounter (Signed)
Copied from [place] [phone]. Topic: Referral - Request for Referral >> Apr 14, 2019  9:18 AM Sheran Luz wrote: Has patient seen PCP for this complaint? Yes *If NO, is insurance requiring patient see PCP for this issue before PCP can refer them? Referral for which specialty: Imaging  Preferred provider/office: [facility] imaging  Reason for referral: mammogram

## 2019-04-15 LAB — MICROSCOPIC EXAMINATION: RBC, Urine: NONE SEEN /hpf (ref 0–2)

## 2019-04-15 LAB — UA/M W/RFLX CULTURE, ROUTINE
Bilirubin, UA: NEGATIVE
Ketones, UA: NEGATIVE
Nitrite, UA: NEGATIVE
Protein,UA: NEGATIVE
RBC, UA: NEGATIVE
Specific Gravity, UA: 1.015 (ref 1.005–1.030)
Urobilinogen, Ur: 0.2 mg/dL (ref 0.2–1.0)
pH, UA: 5.5 (ref 5.0–7.5)

## 2019-04-15 LAB — MICROALBUMIN, URINE WAIVED
Creatinine, Urine Waived: 100 mg/dL (ref 10–300)
Microalb, Ur Waived: 30 mg/L — ABNORMAL HIGH (ref 0–19)

## 2019-04-15 LAB — URINE CULTURE, REFLEX

## 2019-04-15 LAB — BAYER DCA HB A1C WAIVED: HB A1C (BAYER DCA - WAIVED): 5.9 % (ref <7.0)

## 2019-04-20 ENCOUNTER — Encounter: Payer: Self-pay | Admitting: Physical Therapy

## 2019-04-20 ENCOUNTER — Ambulatory Visit: Payer: BLUE CROSS/BLUE SHIELD | Attending: Pain Medicine | Admitting: Physical Therapy

## 2019-04-20 ENCOUNTER — Other Ambulatory Visit: Payer: Self-pay

## 2019-04-20 DIAGNOSIS — R2689 Other abnormalities of gait and mobility: Secondary | ICD-10-CM | POA: Insufficient documentation

## 2019-04-20 DIAGNOSIS — M79604 Pain in right leg: Secondary | ICD-10-CM | POA: Diagnosis not present

## 2019-04-20 DIAGNOSIS — R269 Unspecified abnormalities of gait and mobility: Secondary | ICD-10-CM | POA: Diagnosis not present

## 2019-04-20 DIAGNOSIS — M79605 Pain in left leg: Secondary | ICD-10-CM | POA: Insufficient documentation

## 2019-04-20 DIAGNOSIS — M6281 Muscle weakness (generalized): Secondary | ICD-10-CM | POA: Insufficient documentation

## 2019-04-20 NOTE — Therapy (Deleted)
[facility] [address] [place], [place], [address] Phone: [phone]   Fax:  [phone]  Physical Therapy Treatment  Patient Details  Name: Lisa Galvan MRN: [id number] Date of Birth: 02-22-1961 Referring Provider (PT): Dr. Laban Emperor   Encounter Date: 04/20/2019  PT End of Session - 04/20/19 1231    Visit Number  1    Number of Visits  8    Date for PT Re-Evaluation  05/18/19    Authorization - Visit Number  1    Authorization - Number of Visits  10    PT Start Time  1037    PT Stop Time  1141    PT Time Calculation (min)  64 min    Equipment Utilized During Treatment  Gait belt    Activity Tolerance  Patient tolerated treatment well;No increased pain    Behavior During Therapy  WFL for tasks assessed/performed       Past Medical History:  Diagnosis Date  . Allergy   . Arthritis    spine  . Bilateral leg edema   . Chest pain 12/15/2014  . Chronic sciatica 12/10/2014  . Chronic sciatica 12/10/2014  . Diabetes mellitus without complication (HCC)   . Essential hypertension 12/10/2014  . Essential hypertension 12/10/2014  . Gastroesophageal reflux disease without esophagitis   . GERD (gastroesophageal reflux disease)   . Hyperlipidemia   . Hypertension   . Mixed hyperlipidemia   . Obesity (BMI 30.0-34.9) 12/10/2014  . Type 2 diabetes mellitus without complication (HCC) 12/10/2014    Past Surgical History:  Procedure Laterality Date  . ABDOMINAL HYSTERECTOMY    . COLONOSCOPY WITH PROPOFOL N/A 03/30/2019   Procedure: COLONOSCOPY WITH PROPOFOL;  Surgeon: Toney Reil, MD;  Location: [facility] ENDOSCOPY;  Service: Gastroenterology;  Laterality: N/A;  . ESOPHAGOGASTRODUODENOSCOPY (EGD) WITH PROPOFOL N/A 03/12/2017   Procedure: ESOPHAGOGASTRODUODENOSCOPY (EGD) WITH PROPOFOL;  Surgeon: Toney Reil, MD;  Location: [facility] ENDOSCOPY;  Service: Gastroenterology;  Laterality: N/A;  . ESOPHAGOGASTRODUODENOSCOPY (EGD) WITH  PROPOFOL N/A 03/30/2019   Procedure: ESOPHAGOGASTRODUODENOSCOPY (EGD) WITH PROPOFOL;  Surgeon: Toney Reil, MD;  Location: [facility] ENDOSCOPY;  Service: Gastroenterology;  Laterality: N/A;  . HERNIA REPAIR    . UMBILICAL HERNIA REPAIR N/A 05/01/2017   Procedure: HERNIA REPAIR UMBILICAL ADULT;  Surgeon: Ancil Linsey, MD;  Location: [facility] ORS;  Service: General;  Laterality: N/A;    There were no vitals filed for this visit.  Subjective Assessment - 04/20/19 1048    Subjective  Pt. has no current exercise program.  Pt. concerned about getting on floor because it is difficult to get off.  No falls over past several weeks but pt. reports extensive h/o falls (most recently posterior).    Pertinent History  B LE neuropathy starting about 2 years ago.  H/o DM type 2.  Pt. retired from working with mentally ill.  Pt. enjoys traveling but limited by Covid.  Pt. watches TV at this time.  Pt. was going to [employer] 3x/week prior to Covid.  Lyrica has been helping (not taking Gabapentin at this time).  Pt. has h/o numerous falls over past year.  See MRI reports.  Pt. has received 1 lumbar nerve block and receives 2nd tomorrow from Dr. Laban Emperor.  Pt. reports she is limited with shopping due to prolonged standing/walking.  Pt. lives by herself in 1 story house (has stairs to attic).    Limitations  Lifting;Standing;Walking;House hold activities    How long can you stand  comfortably?  <1 hour (prefers to lean against counter)    How long can you walk comfortably?  <1 mile    Diagnostic tests  See MRI reports (recent)    Patient Stated Goals  Improve balance/ coordination of feet and legs.  Increase LE muscle strengthening.  Decrease fall risk.    Currently in Pain?  Yes    Pain Score  3     Pain Location  Back    Pain Orientation  Lower    Pain Descriptors / Indicators  Aching    Pain Type  Chronic pain    Pain Radiating Towards  pt. reports the pain in back is not a constant pain but a moving pain.     Multiple Pain Sites  Yes    Pain Score  2    Pain Location  Leg    Pain Orientation  Right;Left    Pain Descriptors / Indicators  Numbness    Pain Type  Chronic pain         [facility] PT Assessment - 04/20/19 0001      Assessment   Medical Diagnosis  Weakness of both lower extremities/ Coordination impairment    Referring Provider (PT)  Dr. Laban EmperorNaveira    Onset Date/Surgical Date  04/23/18    Hand Dominance  Right    Next MD Visit  04/21/2019    Prior Therapy  No      Precautions   Precautions  Fall      Restrictions   Weight Bearing Restrictions  No      Balance Screen   Has the patient fallen in the past 6 months  Yes    How many times?  4+    Has the patient had a decrease in activity level because of a fear of falling?   Yes      Home Environment   Living Environment  Private residence      Prior Function   Level of Independence  Independent      Cognition   Overall Cognitive Status  Within Functional Limits for tasks assessed        Lumbar flexion: 25% limited, extension 25% limited (no pain), L/R rotn. WFL.  B LE muscle strength: quads/hamstring 4+/5 MMT.  Hip flexion 4-/5 MMT, hip abduction and adduction 4/5 MMT.   No increase c/o pain with MMT.    No pain with bridging/ No LLD. Good B hamstring flexibility: 75 deg.   10 SLR with no issues.  R LE "felt heavy" as compared to L.    Berg balance test: 47/56           Plan - 04/20/19 1234    Clinical Impression Statement  Pt. is a pleasant 58 y/o female with c/o B LE neuropathy/ pain and balance issues.  Pt. reports 3/10 back pain and 2/10 B LE pain currently at rest with increase pain during prolonged standing/walking.  Pt. presents with slight forward posture/ rounded shoulder in seated and standing position.  Lumbar flexion: 25% limited, extension 25% limited (no pain), L/R rotn. WFL.  B LE muscle strength: quads/hamstring 4+/5 MMT.  Hip flexion 4-/5 MMT, hip abduction and adduction 4/5 MMT.   No  increase c/o pain with MMT.  No pain with bridging/ No LLD.  Good B hamstring flexibility: 75 deg.  10 SLR with no issues but R LE "felt heavy" as compared to L.  Berg balance test: 47/56.  FOTO: initial 60/ goal 64.  Pt. has increase  difficulty standing from low chair and stabilizing balance.  Decrease lower leg/ feet sensation with palpation.  Pt. will benefit from skilled PT services to increase B LE muscle strength to improve balance/ independence with gait.    Stability/Clinical Decision Making  Evolving/Moderate complexity    Clinical Decision Making  Moderate    Rehab Potential  Good    PT Frequency  2x / week    PT Duration  4 weeks    PT Treatment/Interventions  ADLs/Self Care Home Management;Cryotherapy;Electrical Stimulation;Moist Heat;Gait training;Stair training;Functional mobility training;Neuromuscular re-education;Balance training;Therapeutic exercise;Therapeutic activities;Patient/family education;Manual techniques;Passive range of motion;Energy conservation    PT Next Visit Plan  Issue LE strengthening HEP/ discuss SPC use       Patient will benefit from skilled therapeutic intervention in order to improve the following deficits and impairments:  Abnormal gait, Improper body mechanics, Pain, Postural dysfunction, Decreased coordination, Decreased activity tolerance, Decreased endurance, Decreased range of motion, Decreased strength, Impaired flexibility, Difficulty walking, Decreased balance  Visit Diagnosis: Muscle weakness (generalized)  Pain in left leg  Pain in right leg  Gait difficulty  Balance problems     Problem List Patient Active Problem List   Diagnosis Date Noted  . Abnormal MRI, cervical spine (02/08/2019) 03/25/2019  . Lower extremity weakness (Bilateral) 03/25/2019  . Coordination impairment (lower extremity) 03/25/2019  . Hypomagnesemia 03/24/2019  . Spondylosis without myelopathy or radiculopathy, lumbosacral region 03/24/2019  . Lumbar Facet  Hypertrophy 03/24/2019  . Grade 1 Anterolisthesis of L4/5 03/11/2019  . Chronic hip pain (Bilateral) (R>L) 03/11/2019  . Chronic sacroiliac joint pain (Bilateral) 03/11/2019  . Sacroiliac joint somatic dysfunction (Bilateral) 03/11/2019  . Chronic feet pain (Primary Area of Pain) (Bilateral) 03/11/2019  . Chronic leg and foot pain (Bilateral) 03/11/2019  . Diabetic peripheral neuropathy ([place]) 03/11/2019  . Chronic neuropathic pain 03/11/2019  . Neurogenic pain 03/11/2019  . Chronic musculoskeletal pain 03/11/2019  . Osteoarthritis involving multiple joints 03/11/2019  . Chronic pain syndrome 03/10/2019  . Pharmacologic therapy 03/10/2019  . Disorder of skeletal system 03/10/2019  . Problems influencing health status 03/10/2019  . Chronic low back pain (Third area of Pain) (Bilateral) w/o sciatica 03/10/2019  . Chronic lower extremity pain (Secondary area of Pain) (Bilateral) 03/10/2019  . Abnormal MRI, lumbar spine (02/08/2019) 03/10/2019  . Essential hypertension 10/06/2018  . Cholelithiasis 06/23/2018  . Fatty liver 06/23/2018  . Helicobacter pylori infection 05/13/2017  . Mastalgia in female 04/09/2017  . Bilateral leg edema 07/02/2016  . DDD (degenerative disc disease), lumbar 12/16/2014  . Lumbar facet syndrome (Bilateral) 12/16/2014  . Sacroiliac joint dysfunction 12/16/2014  . Neuropathy due to secondary diabetes ([place]) 12/16/2014  . Vitamin D deficiency 12/13/2014  . Morbid obesity ([place]) 12/10/2014  . Type 2 diabetes mellitus with diabetic neuropathy, unspecified ([place]) 12/10/2014  . Hyperlipidemia 12/10/2014  . Gastroesophageal reflux disease without esophagitis 12/10/2014    Pura Spice 04/20/2019, 12:51 PM  [facility] [address] [place], [place], [address] Phone: [phone]   Fax:  [phone]  Name: Annleigh Knueppel MRN: [id number] Date of Birth: 1960-05-11

## 2019-04-20 NOTE — Therapy (Signed)
[facility] [address] [place], [place], [address] Phone: [phone]   Fax:  [phone]  Physical Therapy Evaluation  Patient Details  Name: Lisa Galvan MRN: [id number] Date of Birth: Mar 09, 1961 Referring Provider (PT): Dr. Laban Emperor   Encounter Date: 04/20/2019  PT End of Session - 04/20/19 1231    Visit Number  1    Number of Visits  8    Date for PT Re-Evaluation  05/18/19    Authorization - Visit Number  1    Authorization - Number of Visits  10    PT Start Time  1037    PT Stop Time  1141    PT Time Calculation (min)  64 min    Equipment Utilized During Treatment  Gait belt    Activity Tolerance  Patient tolerated treatment well;No increased pain    Behavior During Therapy  WFL for tasks assessed/performed       Past Medical History:  Diagnosis Date  . Allergy   . Arthritis    spine  . Bilateral leg edema   . Chest pain 12/15/2014  . Chronic sciatica 12/10/2014  . Chronic sciatica 12/10/2014  . Diabetes mellitus without complication (HCC)   . Essential hypertension 12/10/2014  . Essential hypertension 12/10/2014  . Gastroesophageal reflux disease without esophagitis   . GERD (gastroesophageal reflux disease)   . Hyperlipidemia   . Hypertension   . Mixed hyperlipidemia   . Obesity (BMI 30.0-34.9) 12/10/2014  . Type 2 diabetes mellitus without complication (HCC) 12/10/2014    Past Surgical History:  Procedure Laterality Date  . ABDOMINAL HYSTERECTOMY    . COLONOSCOPY WITH PROPOFOL N/A 03/30/2019   Procedure: COLONOSCOPY WITH PROPOFOL;  Surgeon: Toney Reil, MD;  Location: [facility] ENDOSCOPY;  Service: Gastroenterology;  Laterality: N/A;  . ESOPHAGOGASTRODUODENOSCOPY (EGD) WITH PROPOFOL N/A 03/12/2017   Procedure: ESOPHAGOGASTRODUODENOSCOPY (EGD) WITH PROPOFOL;  Surgeon: Toney Reil, MD;  Location: [facility] ENDOSCOPY;  Service: Gastroenterology;  Laterality: N/A;  . ESOPHAGOGASTRODUODENOSCOPY (EGD) WITH  PROPOFOL N/A 03/30/2019   Procedure: ESOPHAGOGASTRODUODENOSCOPY (EGD) WITH PROPOFOL;  Surgeon: Toney Reil, MD;  Location: [facility] ENDOSCOPY;  Service: Gastroenterology;  Laterality: N/A;  . HERNIA REPAIR    . UMBILICAL HERNIA REPAIR N/A 05/01/2017   Procedure: HERNIA REPAIR UMBILICAL ADULT;  Surgeon: Ancil Linsey, MD;  Location: [facility] ORS;  Service: General;  Laterality: N/A;    There were no vitals filed for this visit.   Subjective Assessment - 04/20/19 1048    Subjective  Pt. has no current exercise program.  Pt. concerned about getting on floor because it is difficult to get off.  No falls over past several weeks but pt. reports extensive h/o falls (most recently posterior).    Pertinent History  B LE neuropathy starting about 2 years ago.  H/o DM type 2.  Pt. retired from working with mentally ill.  Pt. enjoys traveling but limited by Covid.  Pt. watches TV at this time.  Pt. was going to [employer] 3x/week prior to Covid.  Lyrica has been helping (not taking Gabapentin at this time).  Pt. has h/o numerous falls over past year.  See MRI reports.  Pt. has received 1 lumbar nerve block and receives 2nd tomorrow from Dr. Laban Emperor.  Pt. reports she is limited with shopping due to prolonged standing/walking.  Pt. lives by herself in 1 story house (has stairs to attic).    Limitations  Lifting;Standing;Walking;House hold activities    How long can you  stand comfortably?  <1 hour (prefers to lean against counter)    How long can you walk comfortably?  <1 mile    Diagnostic tests  See MRI reports (recent)    Patient Stated Goals  Improve balance/ coordination of feet and legs.  Increase LE muscle strengthening.  Decrease fall risk.    Currently in Pain?  Yes    Pain Score  3     Pain Location  Back    Pain Orientation  Lower    Pain Descriptors / Indicators  Aching    Pain Type  Chronic pain    Pain Radiating Towards  pt. reports the pain in back is not a constant pain but a moving  pain.    Multiple Pain Sites  Yes    Pain Score  2    Pain Location  Leg    Pain Orientation  Right;Left    Pain Descriptors / Indicators  Numbness    Pain Type  Chronic pain         [facility] PT Assessment - 04/20/19 0001      Assessment   Medical Diagnosis  Weakness of both lower extremities/ Coordination impairment    Referring Provider (PT)  Dr. Dossie Arbour    Onset Date/Surgical Date  04/23/18    Hand Dominance  Right    Next MD Visit  04/21/2019    Prior Therapy  No      Precautions   Precautions  Fall      Restrictions   Weight Bearing Restrictions  No      Balance Screen   Has the patient fallen in the past 6 months  Yes    How many times?  4+    Has the patient had a decrease in activity level because of a fear of falling?   Yes      [place] residence      Prior Function   Level of Independence  Independent      Cognition   Overall Cognitive Status  Within Functional Limits for tasks assessed          Lumbar flexion: 25% limited, extension 25% limited (no pain), L/R rotn. WFL.  B LE muscle strength: quads/hamstring 4+/5 MMT.  Hip flexion 4-/5 MMT, hip abduction and adduction 4/5 MMT.   No increase c/o pain with MMT.    No pain with bridging/ No LLD. Good B hamstring flexibility: 75 deg.   10 SLR with no issues.  R LE "felt heavy" as compared to L.    Berg balance test: 47/56    Objective measurements completed on examination: See above findings.     Nustep L3 seat 7 5 min. Discuss HEP/ LE ex. Discussed Berg balance score/ use of [facility]    PT Education - 04/20/19 1230    Education Details  Discuss Berg balance test/ fall risk.  Pt. instructed in use of SPC for safety with gait.    Person(s) Educated  Patient    Methods  Explanation;Demonstration    Comprehension  Verbalized understanding;Returned demonstration          PT Long Term Goals - 04/20/19 1244      PT LONG TERM GOAL #1   Title  Pt. will  increase FOTO to 64 to improve safety with functional tasks/ mobility.    Baseline  Initial FOTO: 60    Time  4    Period  Weeks    Status  New    Target Date  05/18/19      PT LONG TERM GOAL #2   Title  Pt. independent with HEP to increase B LE muscle strength to grossly 4+/5 MMT to improve sit to stands/ safety with walking.    Baseline  see flowsheet    Time  4    Period  Weeks    Status  New    Target Date  05/18/19      PT LONG TERM GOAL #3   Title  Pt. able to stand from 17" height chair without UE assist safely with no LOB to improve safety at home.    Baseline  Extra time/ B UE assist required.    Time  4    Period  Weeks    Status  New    Target Date  05/18/19      PT LONG TERM GOAL #4   Title  Pt. will increase Berg balance test to >50/56 to improve safety/ decrease fall risk.    Baseline  Berg on evaluation: 47/56    Time  4    Period  Weeks    Status  New    Target Date  05/18/19      PT LONG TERM GOAL #5   Title  Pt. will ambulate 1 mile with normalized gait pattern and mod. independence safely to promote return to traveling.    Baseline  Gait limited by poor endurance/ balance issues.  Pt. fatigues easily and requires rest breaks.  Pt. enjoys traveling.    Time  4    Period  Weeks    Status  New    Target Date  05/18/19             Plan - 04/20/19 1234    Clinical Impression Statement  Pt. is a pleasant 58 y/o female with c/o B LE neuropathy/ pain and balance issues.  Pt. reports 3/10 back pain and 2/10 B LE pain currently at rest with increase pain during prolonged standing/walking.  Pt. presents with slight forward posture/ rounded shoulder in seated and standing position.  Lumbar flexion: 25% limited, extension 25% limited (no pain), L/R rotn. WFL.  B LE muscle strength: quads/hamstring 4+/5 MMT.  Hip flexion 4-/5 MMT, hip abduction and adduction 4/5 MMT.   No increase c/o pain with MMT.  No pain with bridging/ No LLD.  Good B hamstring flexibility: 75  deg.  10 SLR with no issues but R LE "felt heavy" as compared to L.  Berg balance test: 47/56.  FOTO: initial 60/ goal 64.  Pt. has increase difficulty standing from low chair and stabilizing balance.  Decrease lower leg/ feet sensation with palpation.  Pt. will benefit from skilled PT services to increase B LE muscle strength to improve balance/ independence with gait.    Stability/Clinical Decision Making  Evolving/Moderate complexity    Clinical Decision Making  Moderate    Rehab Potential  Good    PT Frequency  2x / week    PT Duration  4 weeks    PT Treatment/Interventions  ADLs/Self Care Home Management;Cryotherapy;Electrical Stimulation;Moist Heat;Gait training;Stair training;Functional mobility training;Neuromuscular re-education;Balance training;Therapeutic exercise;Therapeutic activities;Patient/family education;Manual techniques;Passive range of motion;Energy conservation    PT Next Visit Plan  Issue LE strengthening HEP/ discuss SPC use       Patient will benefit from skilled therapeutic intervention in order to improve the following deficits and impairments:  Abnormal gait, Improper body mechanics, Pain, Postural dysfunction, Decreased coordination, Decreased activity  tolerance, Decreased endurance, Decreased range of motion, Decreased strength, Impaired flexibility, Difficulty walking, Decreased balance  Visit Diagnosis: Muscle weakness (generalized)  Pain in left leg  Pain in right leg  Gait difficulty  Balance problems     Problem List Patient Active Problem List   Diagnosis Date Noted  . Abnormal MRI, cervical spine (02/08/2019) 03/25/2019  . Lower extremity weakness (Bilateral) 03/25/2019  . Coordination impairment (lower extremity) 03/25/2019  . Hypomagnesemia 03/24/2019  . Spondylosis without myelopathy or radiculopathy, lumbosacral region 03/24/2019  . Lumbar Facet Hypertrophy 03/24/2019  . Grade 1 Anterolisthesis of L4/5 03/11/2019  . Chronic hip pain  (Bilateral) (R>L) 03/11/2019  . Chronic sacroiliac joint pain (Bilateral) 03/11/2019  . Sacroiliac joint somatic dysfunction (Bilateral) 03/11/2019  . Chronic feet pain (Primary Area of Pain) (Bilateral) 03/11/2019  . Chronic leg and foot pain (Bilateral) 03/11/2019  . Diabetic peripheral neuropathy (HCC) 03/11/2019  . Chronic neuropathic pain 03/11/2019  . Neurogenic pain 03/11/2019  . Chronic musculoskeletal pain 03/11/2019  . Osteoarthritis involving multiple joints 03/11/2019  . Chronic pain syndrome 03/10/2019  . Pharmacologic therapy 03/10/2019  . Disorder of skeletal system 03/10/2019  . Problems influencing health status 03/10/2019  . Chronic low back pain (Third area of Pain) (Bilateral) w/o sciatica 03/10/2019  . Chronic lower extremity pain (Secondary area of Pain) (Bilateral) 03/10/2019  . Abnormal MRI, lumbar spine (02/08/2019) 03/10/2019  . Essential hypertension 10/06/2018  . Cholelithiasis 06/23/2018  . Fatty liver 06/23/2018  . Helicobacter pylori infection 05/13/2017  . Mastalgia in female 04/09/2017  . Bilateral leg edema 07/02/2016  . DDD (degenerative disc disease), lumbar 12/16/2014  . Lumbar facet syndrome (Bilateral) 12/16/2014  . Sacroiliac joint dysfunction 12/16/2014  . Neuropathy due to secondary diabetes (HCC) 12/16/2014  . Vitamin D deficiency 12/13/2014  . Morbid obesity (HCC) 12/10/2014  . Type 2 diabetes mellitus with diabetic neuropathy, unspecified (HCC) 12/10/2014  . Hyperlipidemia 12/10/2014  . Gastroesophageal reflux disease without esophagitis 12/10/2014   Cammie McgeeMichael C Marria Mathison, PT, DPT # [id number] 04/20/2019, 12:53 PM  [facility] [address] [place], [place], [address] Phone: [phone]   Fax:  [phone]  Name: Maxcine HamSharon Manygoats MRN: [id number] Date of Birth: Mar 26, 1961

## 2019-04-21 ENCOUNTER — Encounter: Payer: Self-pay | Admitting: Pain Medicine

## 2019-04-21 ENCOUNTER — Ambulatory Visit
Admission: RE | Admit: 2019-04-21 | Discharge: 2019-04-21 | Disposition: A | Discharge status: Home / Self Care | Payer: BLUE CROSS/BLUE SHIELD | Source: Ambulatory Visit | Attending: Pain Medicine | Admitting: Pain Medicine

## 2019-04-21 ENCOUNTER — Other Ambulatory Visit: Payer: Self-pay

## 2019-04-21 ENCOUNTER — Ambulatory Visit (HOSPITAL_BASED_OUTPATIENT_CLINIC_OR_DEPARTMENT_OTHER): Payer: BLUE CROSS/BLUE SHIELD | Admitting: Pain Medicine

## 2019-04-21 VITALS — BP 102/67 | HR 85 | Temp 98.0°F | Resp 20 | Ht 63.0 in | Wt 170.0 lb

## 2019-04-21 DIAGNOSIS — M533 Sacrococcygeal disorders, not elsewhere classified: Secondary | ICD-10-CM | POA: Diagnosis not present

## 2019-04-21 DIAGNOSIS — M9909 Segmental and somatic dysfunction of abdomen and other regions: Secondary | ICD-10-CM | POA: Diagnosis not present

## 2019-04-21 DIAGNOSIS — M47816 Spondylosis without myelopathy or radiculopathy, lumbar region: Secondary | ICD-10-CM | POA: Diagnosis not present

## 2019-04-21 DIAGNOSIS — Z79899 Other long term (current) drug therapy: Secondary | ICD-10-CM | POA: Insufficient documentation

## 2019-04-21 DIAGNOSIS — M5136 Other intervertebral disc degeneration, lumbar region: Secondary | ICD-10-CM

## 2019-04-21 DIAGNOSIS — G8929 Other chronic pain: Secondary | ICD-10-CM

## 2019-04-21 DIAGNOSIS — Z7984 Long term (current) use of oral hypoglycemic drugs: Secondary | ICD-10-CM | POA: Diagnosis not present

## 2019-04-21 DIAGNOSIS — M4310 Spondylolisthesis, site unspecified: Secondary | ICD-10-CM

## 2019-04-21 DIAGNOSIS — M4316 Spondylolisthesis, lumbar region: Secondary | ICD-10-CM | POA: Diagnosis not present

## 2019-04-21 DIAGNOSIS — M47898 Other spondylosis, sacral and sacrococcygeal region: Secondary | ICD-10-CM | POA: Insufficient documentation

## 2019-04-21 DIAGNOSIS — M47817 Spondylosis without myelopathy or radiculopathy, lumbosacral region: Secondary | ICD-10-CM | POA: Diagnosis not present

## 2019-04-21 DIAGNOSIS — M9904 Segmental and somatic dysfunction of sacral region: Secondary | ICD-10-CM

## 2019-04-21 DIAGNOSIS — M545 Low back pain: Secondary | ICD-10-CM | POA: Diagnosis present

## 2019-04-21 MED ORDER — METHYLPREDNISOLONE ACETATE 80 MG/ML IJ SUSP
80.0000 mg | Freq: Once | INTRAMUSCULAR | Status: AC
  Administered 2019-04-21: 11:00:00 40 mg via INTRA_ARTICULAR
  Filled 2019-04-21: qty 1

## 2019-04-21 MED ORDER — LIDOCAINE HCL 2 % IJ SOLN
20.0000 mL | Freq: Once | INTRAMUSCULAR | Status: AC
  Administered 2019-04-21: 11:00:00 400 mg
  Filled 2019-04-21: qty 40

## 2019-04-21 MED ORDER — FENTANYL CITRATE (PF) 100 MCG/2ML IJ SOLN
25.0000 ug | INTRAMUSCULAR | Status: DC | PRN
  Administered 2019-04-21: 100 ug via INTRAVENOUS
  Filled 2019-04-21: qty 2

## 2019-04-21 MED ORDER — ROPIVACAINE HCL 2 MG/ML IJ SOLN
18.0000 mL | Freq: Once | INTRAMUSCULAR | Status: AC
  Administered 2019-04-21: 18 mL via PERINEURAL
  Filled 2019-04-21: qty 20

## 2019-04-21 MED ORDER — MIDAZOLAM HCL 5 MG/5ML IJ SOLN
1.0000 mg | INTRAMUSCULAR | Status: DC | PRN
  Administered 2019-04-21: 11:00:00 4 mg via INTRAVENOUS
  Filled 2019-04-21: qty 5

## 2019-04-21 MED ORDER — LACTATED RINGERS IV SOLN
1000.0000 mL | Freq: Once | INTRAVENOUS | Status: AC
  Administered 2019-04-21: 1000 mL via INTRAVENOUS

## 2019-04-21 MED ORDER — TRIAMCINOLONE ACETONIDE 40 MG/ML IJ SUSP
80.0000 mg | Freq: Once | INTRAMUSCULAR | Status: AC
  Administered 2019-04-21: 11:00:00 80 mg
  Filled 2019-04-21: qty 2

## 2019-04-21 MED ORDER — ROPIVACAINE HCL 2 MG/ML IJ SOLN
4.0000 mL | Freq: Once | INTRAMUSCULAR | Status: AC
  Administered 2019-04-21: 11:00:00 4 mL via INTRA_ARTICULAR
  Filled 2019-04-21: qty 10

## 2019-04-21 MED ORDER — TRIAMCINOLONE ACETONIDE 40 MG/ML IJ SUSP
INTRAMUSCULAR | Status: AC
  Filled 2019-04-21: qty 1

## 2019-04-21 NOTE — Patient Instructions (Addendum)

## 2019-04-21 NOTE — Progress Notes (Signed)
PROVIDER NOTE: Information contained herein reflects review and annotations entered in association with encounter. Interpretation of such information and data should be left to medically-trained personnel. Information provided to patient can be located elsewhere in the medical record under "Patient Instructions". Document created using STT-dictation technology, any transcriptional errors that may result from process are unintentional.   Patient's Name: Lisa Galvan  MRN: [id number]  Referring Provider: Dorcas Carrow, DO  DOB: 25-Nov-1960  PCP: Dorcas Carrow, DO  DOS: 04/21/2019  Note by: Oswaldo Done, MD  Service setting: Ambulatory outpatient  Specialty: Interventional Pain Management  Patient type: Established  Location: [facility] (AMB) Pain Management Facility  Visit type: Interventional Procedure   Primary Reason for Visit: Interventional Pain Management Treatment. CC: Back Pain (lower bilateral )  Procedure:          Anesthesia, Analgesia, Anxiolysis:  Procedure #1: Type: Medial Branch Facet Block #2 Primary Purpose: Diagnostic Region: Lumbar Level: L2, L3, L4, L5, & S1 Medial Branch Level(s) Target Area: For Lumbar Facet blocks, the target is the groove formed by the junction of the transverse process and superior articular process. For the L5 dorsal ramus, the target is the notch between superior articular process and sacral ala. For the S1 dorsal ramus, the target is the superior and lateral edge of the posterior S1 Sacral foramen. Approach: Posterior, paramedial, percutaneous approach. Laterality: Bilateral  Procedure #2: Type: Sacroiliac Joint Block  #1  Primary Purpose: Diagnostic Region: Posterior Lumbosacral Level: PSIS (Posterior Superior Iliac Spine) Sacroiliac Joint Target Area: For upper sacroiliac joint block(s), the target is the superior and posterior margin of the sacroiliac joint. Approach: Ipsilateral approach. Laterality: Right  Type: Moderate (Conscious)  Sedation combined with Local Anesthesia Indication(s): Analgesia and Anxiety Route: Intravenous (IV) IV Access: Secured Sedation: Meaningful verbal contact was maintained at all times during the procedure  Local Anesthetic: Lidocaine 1-2%  Position: Prone   Indications: 1. Lumbar facet syndrome (Bilateral)   2. Spondylosis without myelopathy or radiculopathy, lumbosacral region   3. Grade 1 Anterolisthesis of L4/5   4. Lumbar Facet Hypertrophy   5. DDD (degenerative disc disease), lumbar   6. Chronic sacroiliac joint pain (Bilateral)   7. Sacroiliac joint somatic dysfunction (Bilateral)   8. Sacroiliac joint dysfunction   9. Other spondylosis, sacral and sacrococcygeal region    Pain Score: Pre-procedure: 4 /10 Post-procedure: 0-No pain/10   Pre-op Assessment:  Lisa Galvan is a 58 y.o. (year old), female patient, seen today for interventional treatment. She  has a past surgical history that includes Abdominal hysterectomy; Esophagogastroduodenoscopy (egd) with propofol (N/A, 03/12/2017); Umbilical hernia repair (N/A, 05/01/2017); Hernia repair; Esophagogastroduodenoscopy (egd) with propofol (N/A, 03/30/2019); and Colonoscopy with propofol (N/A, 03/30/2019). Lisa Galvan has a current medication list which includes the following prescription(s): amlodipine, atorvastatin, vitamin d-3, duloxetine, jardiance, epinephrine, fexofenadine, hydrochlorothiazide, ibuprofen, ketoconazole, magnesium oxide, metformin, montelukast, omeprazole, pioglitazone, pregabalin, and valacyclovir, and the following Facility-Administered Medications: fentanyl and midazolam. Her primarily concern today is the Back Pain (lower bilateral )  Initial Vital Signs:  Pulse/HCG Rate: 85ECG Heart Rate: 91 Temp: (!) 97.2 F (36.2 C) Resp: 16 BP: 124/79 SpO2: 100 %  BMI: Estimated body mass index is 30.11 kg/m as calculated from the following:   Height as of this encounter:  (1.6 m).   Weight as of this encounter: 170 lb  (77.1 kg).  Risk Assessment: Allergies: Reviewed. She is allergic to amlodipine besy-benazepril hcl; ivp dye [iodinated diagnostic agents]; shellfish allergy; shellfish-derived products; and ace inhibitors.  Allergy Precautions: None required Coagulopathies: Reviewed. None identified.  Blood-thinner therapy: None at this time Active Infection(s): Reviewed. None identified. Lisa Galvan is afebrile  Site Confirmation: Lisa Galvan was asked to confirm the procedure and laterality before marking the site Procedure checklist: Completed Consent: Before the procedure and under the influence of no sedative(s), amnesic(s), or anxiolytics, the patient was informed of the treatment options, risks and possible complications. To fulfill our ethical and legal obligations, as recommended by the American Medical Association's Code of Ethics, I have informed the patient of my clinical impression; the nature and purpose of the treatment or procedure; the risks, benefits, and possible complications of the intervention; the alternatives, including doing nothing; the risk(s) and benefit(s) of the alternative treatment(s) or procedure(s); and the risk(s) and benefit(s) of doing nothing. The patient was provided information about the general risks and possible complications associated with the procedure. These may include, but are not limited to: failure to achieve desired goals, infection, bleeding, organ or nerve damage, allergic reactions, paralysis, and death. In addition, the patient was informed of those risks and complications associated to Spine-related procedures, such as failure to decrease pain; infection (i.e.: Meningitis, epidural or intraspinal abscess); bleeding (i.e.: epidural hematoma, subarachnoid hemorrhage, or any other type of intraspinal or peri-dural bleeding); organ or nerve damage (i.e.: Any type of peripheral nerve, nerve root, or spinal cord injury) with subsequent damage to sensory, motor, and/or  autonomic systems, resulting in permanent pain, numbness, and/or weakness of one or several areas of the body; allergic reactions; (i.e.: anaphylactic reaction); and/or death. Furthermore, the patient was informed of those risks and complications associated with the medications. These include, but are not limited to: allergic reactions (i.e.: anaphylactic or anaphylactoid reaction(s)); adrenal axis suppression; blood sugar elevation that in diabetics may result in ketoacidosis or comma; water retention that in patients with history of congestive heart failure may result in shortness of breath, pulmonary edema, and decompensation with resultant heart failure; weight gain; swelling or edema; medication-induced neural toxicity; particulate matter embolism and blood vessel occlusion with resultant organ, and/or nervous system infarction; and/or aseptic necrosis of one or more joints. Finally, the patient was informed that Medicine is not an exact science; therefore, there is also the possibility of unforeseen or unpredictable risks and/or possible complications that may result in a catastrophic outcome. The patient indicated having understood very clearly. We have given the patient no guarantees and we have made no promises. Enough time was given to the patient to ask questions, all of which were answered to the patient's satisfaction. Ms. Tercero has indicated that she wanted to continue with the procedure. Attestation: I, the ordering provider, attest that I have discussed with the patient the benefits, risks, side-effects, alternatives, likelihood of achieving goals, and potential problems during recovery for the procedure that I have provided informed consent. Date  Time: 04/21/2019 10:20 AM  Pre-Procedure Preparation:  Monitoring: As per clinic protocol. Respiration, ETCO2, SpO2, BP, heart rate and rhythm monitor placed and checked for adequate function Safety Precautions: Patient was assessed for positional  comfort and pressure points before starting the procedure. Time-out: I initiated and conducted the "Time-out" before starting the procedure, as per protocol. The patient was asked to participate by confirming the accuracy of the "Time Out" information. Verification of the correct person, site, and procedure were performed and confirmed by me, the nursing staff, and the patient. "Time-out" conducted as per Joint Commission's Universal Protocol (UP.01.01.01). Time: 1112  Description of Procedure #1:   Time-out: "  Time-out" completed before starting procedure, as per protocol. Area Prepped: Entire Posterior Lumbosacral Region Prepping solution: DuraPrep (Iodine Povacrylex [0.7% available iodine] and Isopropyl Alcohol, 74% w/w) Safety Precautions: Aspiration looking for blood return was conducted prior to all injections. At no point did we inject any substances, as a needle was being advanced. No attempts were made at seeking any paresthesias. Safe injection practices and needle disposal techniques used. Medications properly checked for expiration dates. SDV (single dose vial) medications used.  Description of the Procedure: Protocol guidelines were followed. The patient was placed in position over the fluoroscopy table. The target area was identified and the area prepped in the usual manner. Skin & deeper tissues infiltrated with local anesthetic. Appropriate amount of time allowed to pass for local anesthetics to take effect. The procedure needle was introduced through the skin, ipsilateral to the reported pain, and advanced to the target area. Employing the "Medial Branch Technique", the needles were advanced to the angle made by the superior and medial portion of the transverse process, and the lateral and inferior portion of the superior articulating process of the targeted vertebral bodies. This area is known as "Burton's Eye" or the "Eye of the [place] Dog". A procedure needle was introduced through the  skin, and this time advanced to the angle made by the superior and medial border of the sacral ala, and the lateral border of the S1 vertebral body. This last needle was later repositioned at the superior and lateral border of the posterior S1 foramen. Negative aspiration confirmed. Solution injected in intermittent fashion, asking for systemic symptoms every 0.5cc of injectate. The needles were then removed and the area cleansed, making sure to leave some of the prepping solution back to take advantage of its long term bactericidal properties. Start Time: 1112 hrs. Materials:  Needle(s) Type: Spinal Needle Gauge: 22G Length: 3.5-in Medication(s): Please see orders for medications and dosing details.  Description of Procedure # 2:   Area Prepped: Entire Posterior Lumbosacral Region Prepping solution: DuraPrep (Iodine Povacrylex [0.7% available iodine] and Isopropyl Alcohol, 74% w/w) Safety Precautions: Aspiration looking for blood return was conducted prior to all injections. At no point did we inject any substances, as a needle was being advanced. No attempts were made at seeking any paresthesias. Safe injection practices and needle disposal techniques used. Medications properly checked for expiration dates. SDV (single dose vial) medications used. Description of the Procedure: Protocol guidelines were followed. The patient was placed in position over the fluoroscopy table. The target area was identified and the area prepped in the usual manner. Skin & deeper tissues infiltrated with local anesthetic. Appropriate amount of time allowed to pass for local anesthetics to take effect. The procedure needle was advanced under fluoroscopic guidance into the sacroiliac joint until a firm endpoint was obtained. Proper needle placement secured. Negative aspiration confirmed. Solution injected in intermittent fashion, asking for systemic symptoms every 0.5cc of injectate. The needles were then removed and the area  cleansed, making sure to leave some of the prepping solution back to take advantage of its long term bactericidal properties. Vitals:   04/21/19 1127 04/21/19 1132 04/21/19 1143 04/21/19 1154  BP: 128/78 121/85 109/80 102/67  Pulse:      Resp: 18 16 15 20   Temp:    98 F (36.7 C)  TempSrc:      SpO2: 95% 99% 100% 100%  Weight:      Height:        End Time: 1127 hrs. Materials:  Needle(s)  Type: Spinal Needle Gauge: 22G Length: 3.5-in Medication(s): Please see orders for medications and dosing details.  Imaging Guidance (Spinal):          Type of Imaging Technique: Fluoroscopy Guidance (Spinal) Indication(s): Assistance in needle guidance and placement for procedures requiring needle placement in or near specific anatomical locations not easily accessible without such assistance. Exposure Time: Please see nurses notes. Contrast: None used. Fluoroscopic Guidance: I was personally present during the use of fluoroscopy. "Tunnel Vision Technique" used to obtain the best possible view of the target area. Parallax error corrected before commencing the procedure. "Direction-depth-direction" technique used to introduce the needle under continuous pulsed fluoroscopy. Once target was reached, antero-posterior, oblique, and lateral fluoroscopic projection used confirm needle placement in all planes. Images permanently stored in EMR. Interpretation: No contrast injected. I personally interpreted the imaging intraoperatively. Adequate needle placement confirmed in multiple planes. Permanent images saved into the patient's record.  Antibiotic Prophylaxis:   Anti-infectives (From admission, onward)   None     Indication(s): None identified  Post-operative Assessment:  Post-procedure Vital Signs:  Pulse/HCG Rate: 8590 Temp: 98 F (36.7 C) Resp: 20 BP: 102/67 SpO2: 100 %  EBL: None  Complications: No immediate post-treatment complications observed by team, or reported by patient.  Note:  The patient tolerated the entire procedure well. A repeat set of vitals were taken after the procedure and the patient was kept under observation following institutional policy, for this type of procedure. Post-procedural neurological assessment was performed, showing return to baseline, prior to discharge. The patient was provided with post-procedure discharge instructions, including a section on how to identify potential problems. Should any problems arise concerning this procedure, the patient was given instructions to immediately contact us, at any time, without hesitation. In any case, we plan to contact the patient by telephone for a follow-up status report regarding this interventional procedure.  Comments:  No additional relevant information.  Plan of Care  Orders:  Orders Placed This Encounter  Procedures  . L-FCT Blk (Today)    Scheduling Instructions:     Procedure: Lumbar facet block (AKA.: Lumbosacral medial branch nerve block)     Side: Bilateral     Level: L3-4, L4-5, & L5-S1 Facets (L2, L3, L4, L5, & S1 Medial Branch Nerves)     Sedation: Patient's choice.     Timeframe: Today    Order Specific Question:   Where will this procedure be performed?    Answer:   [facility]  . S-I Blk (Today)    Scheduling Instructions:     Side: Right-sided     Sedation: With Sedation.     Timeframe: Today    Order Specific Question:   Where will this procedure be performed?    Answer:   [facility]  . Fluoro (C-Arm) (<60 min) (No Report)    Intraoperative interpretation by procedural physician at [facility].    Standing Status:   Standing    Number of Occurrences:   1    Order Specific Question:   Reason for exam:    Answer:   Assistance in needle guidance and placement for procedures requiring needle placement in or near specific anatomical locations not easily accessible without such assistance.  . Consent: L-FCT BLK    Nursing Order: Transcribe to consent  form and obtain patient signature. Note: Always confirm laterality of pain with Ms. Richardson Dopp, before procedure. Procedure: Lumbar Facet Block  under fluoroscopic guidance Indication/Reason: Low Back Pain, with our without  leg pain, due to Facet Joint Arthralgia (Joint Pain) known as Lumbar Facet Syndrome, secondary to Lumbar, and/or Lumbosacral Spondylosis (Arthritis of the Spine), without myelopathy or radiculopathy (Nerve Damage). Provider Attestation: I, Loie Jahr A. Laban EmperorNaveira, MD, (Pain Management Specialist), the physician/practitioner, attest that I have discussed with the patient the benefits, risks, side effects, alternatives, likelihood of achieving goals and potential problems during recovery for the procedure that I have provided informed consent.  [facility] Block Tray    Equipment required: Single use, disposable, "Block Tray"    Standing Status:   Standing    Number of Occurrences:   1    Order Specific Question:   Specify    Answer:   Block Tray  . Consent: SI Block    Provider Attestation: I, Khianna Blazina A. Laban EmperorNaveira, MD, (Pain Management Specialist), the physician/practitioner, attest that I have discussed with the patient the benefits, risks, side effects, alternatives, likelihood of achieving goals and potential problems during recovery for the procedure that I have provided informed consent.    Scheduling Instructions:     Procedure: Sacroiliac Joint Block     Indication/Reason: Chronic Low Back and Hip Pain secondary to Sacroiliac Joint Pain (Arthralgia/Arthropathy)     Nursing Order: Transcribe to consent form and obtain patient signature.     Note: Always confirm laterality of pain with Ms. Richardson Doppole, before procedure.   Chronic Opioid Analgesic:  No opioid analgesics prescribed by our practice.   Medications ordered for procedure: Meds ordered this encounter  Medications  . lidocaine (XYLOCAINE) 2 % (with pres) injection 400 mg  . lactated ringers infusion 1,000 mL  . midazolam (VERSED) 5  MG/5ML injection 1-2 mg    Make sure Flumazenil is available in the pyxis when using this medication. If oversedation occurs, administer 0.2 mg IV over 15 sec. If after 45 sec no response, administer 0.2 mg again over 1 min; may repeat at 1 min intervals; not to exceed 4 doses (1 mg)  . fentaNYL (SUBLIMAZE) injection 25-50 mcg    Make sure Narcan is available in the pyxis when using this medication. In the event of respiratory depression (RR< 8/min): Titrate NARCAN (naloxone) in increments of 0.1 to 0.2 mg IV at 2-3 minute intervals, until desired degree of reversal.  . ropivacaine (PF) 2 mg/mL (0.2%) (NAROPIN) injection 18 mL  . triamcinolone acetonide (KENALOG-40) injection 80 mg  . ropivacaine (PF) 2 mg/mL (0.2%) (NAROPIN) injection 4 mL  . methylPREDNISolone acetate (DEPO-MEDROL) injection 80 mg   Medications administered: We administered lidocaine, lactated ringers, midazolam, fentaNYL, ropivacaine (PF) 2 mg/mL (0.2%), triamcinolone acetonide, ropivacaine (PF) 2 mg/mL (0.2%), and methylPREDNISolone acetate.  See the medical record for exact dosing, route, and time of administration.  Follow-up plan:   Return in about 2 weeks (around 05/05/2019) for (VV), (PP).       Interventional management options: Planned, scheduled, and/or pending:    Diagnostic bilateral lumbar facet block #1    Considering:   EMG/PNCV of the lower extremities ordered today. Physical therapy referral for treatment of bilateral lower extremity weakness and lack of coordination, ordered today. Diagnostic bilateral lumbar sympathetic block  Diagnostic bilateral lumbar facet block  Possible bilateral lumbar facet RFA  Diagnostic bilateral IA hip joint injection  Possible bilateral obturator + femoral NB  Possible bilateral obturator + femoral nerve RFA  Diagnostic bilateral SI joint block  Possible bilateral SI joint RFA    PRN Procedures:   None at this time    Recent Visits Date Type Provider  Dept   04/14/19 Telemedicine Delano Metz, MD [facility]  04/02/19 Procedure visit Delano Metz, MD [facility]  03/25/19 Telemedicine Delano Metz, MD [facility]  03/11/19 Office Visit Delano Metz, MD [facility]  Showing recent visits within past 90 days and meeting all other requirements   Today's Visits Date Type Provider Dept  04/21/19 Procedure visit Delano Metz, MD [facility]  Showing today's visits and meeting all other requirements   Future Appointments Date Type Provider Dept  05/06/19 Appointment Delano Metz, MD [facility]  05/25/19 Appointment Delano Metz, MD [facility]  Showing future appointments within next 90 days and meeting all other requirements   Disposition: Discharge home  Discharge Date & Time: 04/21/2019; 1155 hrs.   Primary Care Physician: Dorcas Carrow, DO Location: [facility] Note by: Oswaldo Done, MD Date: 04/21/2019; Time: 1:36 PM  Disclaimer:  Medicine is not an Visual merchandiser. The only guarantee in medicine is that nothing is guaranteed. It is important to note that the decision to proceed with this intervention was based on the information collected from the patient. The Data and conclusions were drawn from the patient's questionnaire, the interview, and the physical examination. Because the information was provided in large part by the patient, it cannot be guaranteed that it has not been purposely or unconsciously manipulated. Every effort has been made to obtain as much relevant data as possible for this evaluation. It is important to note that the conclusions that lead to this procedure are derived in large part from the available data. Always take into account that the treatment will also be dependent on availability of resources and existing treatment guidelines, considered by other Pain Management  Practitioners as being common knowledge and practice, at the time of the intervention. For Medico-Legal purposes, it is also important to point out that variation in procedural techniques and pharmacological choices are the acceptable norm. The indications, contraindications, technique, and results of the above procedure should only be interpreted and judged by a Board-Certified Interventional Pain Specialist with extensive familiarity and expertise in the same exact procedure and technique.

## 2019-04-22 ENCOUNTER — Ambulatory Visit: Payer: BLUE CROSS/BLUE SHIELD | Admitting: Physical Therapy

## 2019-04-22 ENCOUNTER — Encounter: Payer: Self-pay | Admitting: Physical Therapy

## 2019-04-22 ENCOUNTER — Telehealth: Payer: Self-pay

## 2019-04-22 DIAGNOSIS — M6281 Muscle weakness (generalized): Secondary | ICD-10-CM

## 2019-04-22 DIAGNOSIS — M79605 Pain in left leg: Secondary | ICD-10-CM

## 2019-04-22 DIAGNOSIS — M79604 Pain in right leg: Secondary | ICD-10-CM | POA: Diagnosis not present

## 2019-04-22 DIAGNOSIS — R269 Unspecified abnormalities of gait and mobility: Secondary | ICD-10-CM | POA: Diagnosis not present

## 2019-04-22 DIAGNOSIS — R2689 Other abnormalities of gait and mobility: Secondary | ICD-10-CM

## 2019-04-22 NOTE — Telephone Encounter (Signed)
Post procedure phone call. Patient states she is doing good.  

## 2019-04-22 NOTE — Patient Instructions (Signed)
Access Code: [email or web address]  URL: [email or web address]  Date: 04/22/2019  Prepared by: Dorcas Carrow   Exercises  Supine March - 20 reps - 1 sets - 1x daily - 7x weekly  Supine Lower Trunk Rotation - 20 reps - 1 sets - 1x daily - 7x weekly  Straight Leg Raise - 20 reps - 1 sets - 1x daily - 7x weekly  Sit to Stand - 10 reps - 1 sets - 1x daily - 7x weekly  Standing Hip Abduction with Anterior Support - 20 reps - 1 sets - 1x daily - 7x weekly

## 2019-04-22 NOTE — Therapy (Signed)
[place] [address] [place], [place], [address] Phone: [phone]   Fax:  [phone]  Physical Therapy Treatment  Patient Details  Name: Lisa Galvan MRN: [id number] Date of Birth: 02/04/1961 Referring Provider (PT): Dr. Laban Emperor   Encounter Date: 04/22/2019  PT End of Session - 04/22/19 0815    Visit Number  2    Number of Visits  8    Date for PT Re-Evaluation  05/18/19    Authorization - Visit Number  2    Authorization - Number of Visits  10    PT Start Time  0812    PT Stop Time  0903    PT Time Calculation (min)  51 min    Equipment Utilized During Treatment  Gait belt    Activity Tolerance  Patient tolerated treatment well;No increased pain    Behavior During Therapy  WFL for tasks assessed/performed       Past Medical History:  Diagnosis Date  . Allergy   . Arthritis    spine  . Bilateral leg edema   . Chest pain 12/15/2014  . Chronic sciatica 12/10/2014  . Chronic sciatica 12/10/2014  . Diabetes mellitus without complication (HCC)   . Essential hypertension 12/10/2014  . Essential hypertension 12/10/2014  . Gastroesophageal reflux disease without esophagitis   . GERD (gastroesophageal reflux disease)   . Hyperlipidemia   . Hypertension   . Mixed hyperlipidemia   . Obesity (BMI 30.0-34.9) 12/10/2014  . Type 2 diabetes mellitus without complication (HCC) 12/10/2014    Past Surgical History:  Procedure Laterality Date  . ABDOMINAL HYSTERECTOMY    . COLONOSCOPY WITH PROPOFOL N/A 03/30/2019   Procedure: COLONOSCOPY WITH PROPOFOL;  Surgeon: Toney Reil, MD;  Location: [facility] ENDOSCOPY;  Service: Gastroenterology;  Laterality: N/A;  . ESOPHAGOGASTRODUODENOSCOPY (EGD) WITH PROPOFOL N/A 03/12/2017   Procedure: ESOPHAGOGASTRODUODENOSCOPY (EGD) WITH PROPOFOL;  Surgeon: Toney Reil, MD;  Location: [facility] ENDOSCOPY;  Service: Gastroenterology;  Laterality: N/A;  . ESOPHAGOGASTRODUODENOSCOPY (EGD) WITH  PROPOFOL N/A 03/30/2019   Procedure: ESOPHAGOGASTRODUODENOSCOPY (EGD) WITH PROPOFOL;  Surgeon: Toney Reil, MD;  Location: [facility] ENDOSCOPY;  Service: Gastroenterology;  Laterality: N/A;  . HERNIA REPAIR    . UMBILICAL HERNIA REPAIR N/A 05/01/2017   Procedure: HERNIA REPAIR UMBILICAL ADULT;  Surgeon: Ancil Linsey, MD;  Location: [facility] ORS;  Service: General;  Laterality: N/A;    There were no vitals filed for this visit.  Subjective Assessment - 04/22/19 0814    Subjective  Pt. reports no LOB since initial evaluation.  Pt. had facet injection yesterday with benefit reported.  Pt. c/o 2/10 back pain prior to tx. session.    Pertinent History  B LE neuropathy starting about 2 years ago.  H/o DM type 2.  Pt. retired from working with mentally ill.  Pt. enjoys traveling but limited by Covid.  Pt. watches TV at this time.  Pt. was going to [employer] 3x/week prior to Covid.  Lyrica has been helping (not taking Gabapentin at this time).  Pt. has h/o numerous falls over past year.  See MRI reports.  Pt. has received 1 lumbar nerve block and receives 2nd tomorrow from Dr. Laban Emperor.  Pt. reports she is limited with shopping due to prolonged standing/walking.  Pt. lives by herself in 1 story house (has stairs to attic).    Limitations  Lifting;Standing;Walking;House hold activities    How long can you stand comfortably?  <1 hour (prefers to lean against counter)  How long can you walk comfortably?  <1 mile    Diagnostic tests  See MRI reports (recent)    Patient Stated Goals  Improve balance/ coordination of feet and legs.  Increase LE muscle strengthening.  Decrease fall risk.    Currently in Pain?  Yes    Pain Score  2     Pain Location  Back    Pain Orientation  Right;Left;Lower    Pain Onset  More than a month ago        There.ex.:  Supine lumbar/LE stretches (7 min.)- focus on hamstring/ piriformis/ rotn. with static holds as tolerated.  No pain reported.   Supine HEP (see  handouts)- discussed TrA muscle activation Nustep L4 min. B UE/LE 12 min. (0.33 miles)  Neuro:  Walking in hallway working on maintaining BOS/ upright posture and consistent arm swing (no LOB).  Instructed in use of SPC with 2-point gait pattern.  Standing 6" and 12" step touches with no UE assist.  1 LOB but able to self-correct    PT Education - 04/22/19 0835    Education Details  See HEP    Person(s) Educated  Patient    Methods  Explanation;Demonstration;Handout    Comprehension  Verbalized understanding;Returned demonstration          PT Long Term Goals - 04/20/19 1244      PT LONG TERM GOAL #1   Title  Pt. will increase FOTO to 64 to improve safety with functional tasks/ mobility.    Baseline  Initial FOTO: 60    Time  4    Period  Weeks    Status  New    Target Date  05/18/19      PT LONG TERM GOAL #2   Title  Pt. independent with HEP to increase B LE muscle strength to grossly 4+/5 MMT to improve sit to stands/ safety with walking.    Baseline  see flowsheet    Time  4    Period  Weeks    Status  New    Target Date  05/18/19      PT LONG TERM GOAL #3   Title  Pt. able to stand from 17" height chair without UE assist safely with no LOB to improve safety at home.    Baseline  Extra time/ B UE assist required.    Time  4    Period  Weeks    Status  New    Target Date  05/18/19      PT LONG TERM GOAL #4   Title  Pt. will increase Berg balance test to >50/56 to improve safety/ decrease fall risk.    Baseline  Berg on evaluation: 47/56    Time  4    Period  Weeks    Status  New    Target Date  05/18/19      PT LONG TERM GOAL #5   Title  Pt. will ambulate 1 mile with normalized gait pattern and mod. independence safely to promote return to traveling.    Baseline  Gait limited by poor endurance/ balance issues.  Pt. fatigues easily and requires rest breaks.  Pt. enjoys traveling.    Time  4    Period  Weeks    Status  New    Target Date  05/18/19             Plan - 04/22/19 0835    Clinical Impression Statement  Pt. requires mod. cuing for proper  use/ technique with SPC.  Pt. demonstrates a more consistent 2-point gait pattern at end of tx. session with use of SPC.  Pt. cued to focus on upright posture and increase hip flexion to improve consistent heel strike.  Good hip flexion noted during standing stair touches at 6" and 12" height.  See new HEP.    Stability/Clinical Decision Making  Evolving/Moderate complexity    Clinical Decision Making  Moderate    Rehab Potential  Good    PT Frequency  2x / week    PT Duration  4 weeks    PT Treatment/Interventions  ADLs/Self Care Home Management;Cryotherapy;Electrical Stimulation;Moist Heat;Gait training;Stair training;Functional mobility training;Neuromuscular re-education;Balance training;Therapeutic exercise;Therapeutic activities;Patient/family education;Manual techniques;Passive range of motion;Energy conservation    PT Next Visit Plan  Dynamic balance tasks/ SPC use with gait.       Patient will benefit from skilled therapeutic intervention in order to improve the following deficits and impairments:  Abnormal gait, Improper body mechanics, Pain, Postural dysfunction, Decreased coordination, Decreased activity tolerance, Decreased endurance, Decreased range of motion, Decreased strength, Impaired flexibility, Difficulty walking, Decreased balance  Visit Diagnosis: Muscle weakness (generalized)  Pain in left leg  Pain in right leg  Gait difficulty  Balance problems     Problem List Patient Active Problem List   Diagnosis Date Noted  . Other spondylosis, sacral and sacrococcygeal region 04/21/2019  . Abnormal MRI, cervical spine (02/08/2019) 03/25/2019  . Lower extremity weakness (Bilateral) 03/25/2019  . Coordination impairment (lower extremity) 03/25/2019  . Hypomagnesemia 03/24/2019  . Spondylosis without myelopathy or radiculopathy, lumbosacral region 03/24/2019  .  Lumbar Facet Hypertrophy 03/24/2019  . Grade 1 Anterolisthesis of L4/5 03/11/2019  . Chronic hip pain (Bilateral) (R>L) 03/11/2019  . Chronic sacroiliac joint pain (Bilateral) 03/11/2019  . Sacroiliac joint somatic dysfunction (Bilateral) 03/11/2019  . Chronic feet pain (Primary Area of Pain) (Bilateral) 03/11/2019  . Chronic leg and foot pain (Bilateral) 03/11/2019  . Diabetic peripheral neuropathy (HCC) 03/11/2019  . Chronic neuropathic pain 03/11/2019  . Neurogenic pain 03/11/2019  . Chronic musculoskeletal pain 03/11/2019  . Osteoarthritis involving multiple joints 03/11/2019  . Chronic pain syndrome 03/10/2019  . Pharmacologic therapy 03/10/2019  . Disorder of skeletal system 03/10/2019  . Problems influencing health status 03/10/2019  . Chronic low back pain (Third area of Pain) (Bilateral) w/o sciatica 03/10/2019  . Chronic lower extremity pain (Secondary area of Pain) (Bilateral) 03/10/2019  . Abnormal MRI, lumbar spine (02/08/2019) 03/10/2019  . Essential hypertension 10/06/2018  . Cholelithiasis 06/23/2018  . Fatty liver 06/23/2018  . Helicobacter pylori infection 05/13/2017  . Mastalgia in female 04/09/2017  . Bilateral leg edema 07/02/2016  . DDD (degenerative disc disease), lumbar 12/16/2014  . Lumbar facet syndrome (Bilateral) 12/16/2014  . Sacroiliac joint dysfunction 12/16/2014  . Neuropathy due to secondary diabetes (HCC) 12/16/2014  . Vitamin D deficiency 12/13/2014  . Morbid obesity (HCC) 12/10/2014  . Type 2 diabetes mellitus with diabetic neuropathy, unspecified (HCC) 12/10/2014  . Hyperlipidemia 12/10/2014  . Gastroesophageal reflux disease without esophagitis 12/10/2014   Cammie McgeeMichael C Farryn Linares, PT, DPT # [phone] 04/22/2019, 10:04 AM  [facility] [address] [place], [place], [address] Phone: [phone]   Fax:  [phone]  Name: Lisa Galvan MRN: [id number] Date of Birth: 18-Oct-1960

## 2019-04-27 ENCOUNTER — Encounter: Payer: Self-pay | Admitting: Physical Therapy

## 2019-04-27 ENCOUNTER — Other Ambulatory Visit: Payer: Self-pay

## 2019-04-27 ENCOUNTER — Ambulatory Visit: Payer: BLUE CROSS/BLUE SHIELD | Attending: Pain Medicine | Admitting: Physical Therapy

## 2019-04-27 DIAGNOSIS — R2689 Other abnormalities of gait and mobility: Secondary | ICD-10-CM | POA: Insufficient documentation

## 2019-04-27 DIAGNOSIS — M79604 Pain in right leg: Secondary | ICD-10-CM | POA: Insufficient documentation

## 2019-04-27 DIAGNOSIS — R269 Unspecified abnormalities of gait and mobility: Secondary | ICD-10-CM | POA: Insufficient documentation

## 2019-04-27 DIAGNOSIS — M79605 Pain in left leg: Secondary | ICD-10-CM | POA: Diagnosis not present

## 2019-04-27 DIAGNOSIS — M6281 Muscle weakness (generalized): Secondary | ICD-10-CM | POA: Diagnosis not present

## 2019-04-27 NOTE — Therapy (Signed)
[place] [facility] [address] [place], [place], [address] Phone: [phone]   Fax:  [phone]  Physical Therapy Treatment  Patient Details  Name: Lisa Galvan MRN: [id number] Date of Birth: 06/13/1960 Referring Provider (PT): Dr. Laban Emperor   Encounter Date: 04/27/2019  PT End of Session - 04/27/19 0935    Visit Number  3    Number of Visits  8    Date for PT Re-Evaluation  05/18/19    Authorization - Visit Number  3    Authorization - Number of Visits  10    PT Start Time  0811    PT Stop Time  0902    PT Time Calculation (min)  51 min    Equipment Utilized During Treatment  Gait belt    Activity Tolerance  Patient tolerated treatment well;No increased pain    Behavior During Therapy  WFL for tasks assessed/performed       Past Medical History:  Diagnosis Date  . Allergy   . Arthritis    spine  . Bilateral leg edema   . Chest pain 12/15/2014  . Chronic sciatica 12/10/2014  . Chronic sciatica 12/10/2014  . Diabetes mellitus without complication (HCC)   . Essential hypertension 12/10/2014  . Essential hypertension 12/10/2014  . Gastroesophageal reflux disease without esophagitis   . GERD (gastroesophageal reflux disease)   . Hyperlipidemia   . Hypertension   . Mixed hyperlipidemia   . Obesity (BMI 30.0-34.9) 12/10/2014  . Type 2 diabetes mellitus without complication (HCC) 12/10/2014    Past Surgical History:  Procedure Laterality Date  . ABDOMINAL HYSTERECTOMY    . COLONOSCOPY WITH PROPOFOL N/A 03/30/2019   Procedure: COLONOSCOPY WITH PROPOFOL;  Surgeon: Toney Reil, MD;  Location: [facility] ENDOSCOPY;  Service: Gastroenterology;  Laterality: N/A;  . ESOPHAGOGASTRODUODENOSCOPY (EGD) WITH PROPOFOL N/A 03/12/2017   Procedure: ESOPHAGOGASTRODUODENOSCOPY (EGD) WITH PROPOFOL;  Surgeon: Toney Reil, MD;  Location: [facility] ENDOSCOPY;  Service: Gastroenterology;  Laterality: N/A;  . ESOPHAGOGASTRODUODENOSCOPY (EGD) WITH  PROPOFOL N/A 03/30/2019   Procedure: ESOPHAGOGASTRODUODENOSCOPY (EGD) WITH PROPOFOL;  Surgeon: Toney Reil, MD;  Location: [facility] ENDOSCOPY;  Service: Gastroenterology;  Laterality: N/A;  . HERNIA REPAIR    . UMBILICAL HERNIA REPAIR N/A 05/01/2017   Procedure: HERNIA REPAIR UMBILICAL ADULT;  Surgeon: Ancil Linsey, MD;  Location: [facility] ORS;  Service: General;  Laterality: N/A;    There were no vitals filed for this visit.  Subjective Assessment - 04/27/19 0934    Subjective  Pt. states she was tired yesterday from putting away Christmas decorations.  Pt. had to ascend/ descend stairs numerous times to put boxes in attic.  No falls but moderate LE fatigue noted.    Pertinent History  B LE neuropathy starting about 2 years ago.  H/o DM type 2.  Pt. retired from working with mentally ill.  Pt. enjoys traveling but limited by Covid.  Pt. watches TV at this time.  Pt. was going to [employer] 3x/week prior to Covid.  Lyrica has been helping (not taking Gabapentin at this time).  Pt. has h/o numerous falls over past year.  See MRI reports.  Pt. has received 1 lumbar nerve block and receives 2nd tomorrow from Dr. Laban Emperor.  Pt. reports she is limited with shopping due to prolonged standing/walking.  Pt. lives by herself in 1 story house (has stairs to attic).    Limitations  Lifting;Standing;Walking;House hold activities    How long can you stand comfortably?  <1  hour (prefers to lean against counter)    How long can you walk comfortably?  <1 mile    Diagnostic tests  See MRI reports (recent)    Patient Stated Goals  Improve balance/ coordination of feet and legs.  Increase LE muscle strengthening.  Decrease fall risk.    Currently in Pain?  Yes   no subjective number given   Pain Onset  More than a month ago         There.ex.:  Nustep L2-4 min. B UE/LE 12 min. (0.38 miles) Seated LAQ/ marching/ alt. UE and LE 20x each.  Supine lumbar/LE stretches (9 min.)- focus on piriformis/ rotn.  with static holds as tolerated.  No pain reported.   Supine SLR 10x/ hip abduction with RTB 10x unilateral/ 10x bilateral/ bolster bridging 10x.  Fatigue and heaviness in LE reported. Sit to stands 10x from gray chair.   Gait  Walking in hallway working on maintaining BOS/ upright posture and consistent arm swing.   Instructed in use of SPC with 2-point gait pattern.  PT discussed importance of consistent heel strike during gait to decrease fall risk.     PT Long Term Goals - 04/20/19 1244      PT LONG TERM GOAL #1   Title  Pt. will increase FOTO to 64 to improve safety with functional tasks/ mobility.    Baseline  Initial FOTO: 60    Time  4    Period  Weeks    Status  New    Target Date  05/18/19      PT LONG TERM GOAL #2   Title  Pt. independent with HEP to increase B LE muscle strength to grossly 4+/5 MMT to improve sit to stands/ safety with walking.    Baseline  see flowsheet    Time  4    Period  Weeks    Status  New    Target Date  05/18/19      PT LONG TERM GOAL #3   Title  Pt. able to stand from 17" height chair without UE assist safely with no LOB to improve safety at home.    Baseline  Extra time/ B UE assist required.    Time  4    Period  Weeks    Status  New    Target Date  05/18/19      PT LONG TERM GOAL #4   Title  Pt. will increase Berg balance test to >50/56 to improve safety/ decrease fall risk.    Baseline  Berg on evaluation: 47/56    Time  4    Period  Weeks    Status  New    Target Date  05/18/19      PT LONG TERM GOAL #5   Title  Pt. will ambulate 1 mile with normalized gait pattern and mod. independence safely to promote return to traveling.    Baseline  Gait limited by poor endurance/ balance issues.  Pt. fatigues easily and requires rest breaks.  Pt. enjoys traveling.    Time  4    Period  Weeks    Status  New    Target Date  05/18/19            Plan - 04/27/19 0936    Clinical Impression Statement  Pt. presented with shortened  stride length, R lateral trunk lean during L stance phase, and decreased hip extension. Pt. required verbal cueing for use of SPC. Pt. reported fatigue and  heaviness in L>R LE during seated marching and supine SLR.  Pt. had difficulty with intiating sit to stand exercises and L SLR. Pt. presented with asymmetry with hooklying hip ABD with increase ROM of R hip abductor as compared to L.    Stability/Clinical Decision Making  Evolving/Moderate complexity    Clinical Decision Making  Moderate    Rehab Potential  Good    PT Frequency  2x / week    PT Duration  4 weeks    PT Treatment/Interventions  ADLs/Self Care Home Management;Cryotherapy;Electrical Stimulation;Moist Heat;Gait training;Stair training;Functional mobility training;Neuromuscular re-education;Balance training;Therapeutic exercise;Therapeutic activities;Patient/family education;Manual techniques;Passive range of motion;Energy conservation    PT Next Visit Plan  Dynamic balance tasks/ SPC use with gait.  Issue standing ther.ex. (hip abduction).       Patient will benefit from skilled therapeutic intervention in order to improve the following deficits and impairments:  Abnormal gait, Improper body mechanics, Pain, Postural dysfunction, Decreased coordination, Decreased activity tolerance, Decreased endurance, Decreased range of motion, Decreased strength, Impaired flexibility, Difficulty walking, Decreased balance  Visit Diagnosis: Muscle weakness (generalized)  Pain in left leg  Pain in right leg  Gait difficulty  Balance problems     Problem List Patient Active Problem List   Diagnosis Date Noted  . Other spondylosis, sacral and sacrococcygeal region 04/21/2019  . Abnormal MRI, cervical spine (02/08/2019) 03/25/2019  . Lower extremity weakness (Bilateral) 03/25/2019  . Coordination impairment (lower extremity) 03/25/2019  . Hypomagnesemia 03/24/2019  . Spondylosis without myelopathy or radiculopathy, lumbosacral region  03/24/2019  . Lumbar Facet Hypertrophy 03/24/2019  . Grade 1 Anterolisthesis of L4/5 03/11/2019  . Chronic hip pain (Bilateral) (R>L) 03/11/2019  . Chronic sacroiliac joint pain (Bilateral) 03/11/2019  . Sacroiliac joint somatic dysfunction (Bilateral) 03/11/2019  . Chronic feet pain (Primary Area of Pain) (Bilateral) 03/11/2019  . Chronic leg and foot pain (Bilateral) 03/11/2019  . Diabetic peripheral neuropathy (HCC) 03/11/2019  . Chronic neuropathic pain 03/11/2019  . Neurogenic pain 03/11/2019  . Chronic musculoskeletal pain 03/11/2019  . Osteoarthritis involving multiple joints 03/11/2019  . Chronic pain syndrome 03/10/2019  . Pharmacologic therapy 03/10/2019  . Disorder of skeletal system 03/10/2019  . Problems influencing health status 03/10/2019  . Chronic low back pain (Third area of Pain) (Bilateral) w/o sciatica 03/10/2019  . Chronic lower extremity pain (Secondary area of Pain) (Bilateral) 03/10/2019  . Abnormal MRI, lumbar spine (02/08/2019) 03/10/2019  . Essential hypertension 10/06/2018  . Cholelithiasis 06/23/2018  . Fatty liver 06/23/2018  . Helicobacter pylori infection 05/13/2017  . Mastalgia in female 04/09/2017  . Bilateral leg edema 07/02/2016  . DDD (degenerative disc disease), lumbar 12/16/2014  . Lumbar facet syndrome (Bilateral) 12/16/2014  . Sacroiliac joint dysfunction 12/16/2014  . Neuropathy due to secondary diabetes (HCC) 12/16/2014  . Vitamin D deficiency 12/13/2014  . Morbid obesity (HCC) 12/10/2014  . Type 2 diabetes mellitus with diabetic neuropathy, unspecified (HCC) 12/10/2014  . Hyperlipidemia 12/10/2014  . Gastroesophageal reflux disease without esophagitis 12/10/2014   Cammie Mcgee, PT, DPT # [phone] 04/27/2019, 12:22 PM  [facility] [address] [place], [place], [address] Phone: [phone]   Fax:  [phone]  Name: Jaquaya Coyle MRN: [id number] Date of Birth: [birth year]

## 2019-04-29 ENCOUNTER — Ambulatory Visit: Payer: BLUE CROSS/BLUE SHIELD | Admitting: Physical Therapy

## 2019-04-30 ENCOUNTER — Encounter: Payer: Self-pay | Admitting: Physical Therapy

## 2019-04-30 ENCOUNTER — Ambulatory Visit: Payer: BLUE CROSS/BLUE SHIELD | Admitting: Physical Therapy

## 2019-04-30 ENCOUNTER — Other Ambulatory Visit: Payer: Self-pay

## 2019-04-30 DIAGNOSIS — M79604 Pain in right leg: Secondary | ICD-10-CM | POA: Diagnosis not present

## 2019-04-30 DIAGNOSIS — R2689 Other abnormalities of gait and mobility: Secondary | ICD-10-CM

## 2019-04-30 DIAGNOSIS — M6281 Muscle weakness (generalized): Secondary | ICD-10-CM

## 2019-04-30 DIAGNOSIS — R269 Unspecified abnormalities of gait and mobility: Secondary | ICD-10-CM | POA: Diagnosis not present

## 2019-04-30 DIAGNOSIS — M79605 Pain in left leg: Secondary | ICD-10-CM | POA: Diagnosis not present

## 2019-04-30 NOTE — Therapy (Signed)
[facility] [address] [place], [place], [address] Phone: [phone]   Fax:  [phone]  Physical Therapy Treatment  Patient Details  Name: Lisa Galvan MRN: [id number] Date of Birth: 1960/11/06 Referring Provider (PT): Dr. Dossie Arbour   Encounter Date: 04/30/2019  PT End of Session - 04/30/19 1513    Visit Number  4    Number of Visits  8    Date for PT Re-Evaluation  05/18/19    Authorization - Visit Number  4    Authorization - Number of Visits  10    PT Start Time  1332    PT Stop Time  1435    PT Time Calculation (min)  63 min    Equipment Utilized During Treatment  Gait belt    Activity Tolerance  Patient tolerated treatment well;No increased pain    Behavior During Therapy  WFL for tasks assessed/performed       Past Medical History:  Diagnosis Date  . Allergy   . Arthritis    spine  . Bilateral leg edema   . Chest pain 12/15/2014  . Chronic sciatica 12/10/2014  . Chronic sciatica 12/10/2014  . Diabetes mellitus without complication ([place])   . Essential hypertension 12/10/2014  . Essential hypertension 12/10/2014  . Gastroesophageal reflux disease without esophagitis   . GERD (gastroesophageal reflux disease)   . Hyperlipidemia   . Hypertension   . Mixed hyperlipidemia   . Obesity (BMI 30.0-34.9) 12/10/2014  . Type 2 diabetes mellitus without complication ([place]) 9/32/3557    Past Surgical History:  Procedure Laterality Date  . ABDOMINAL HYSTERECTOMY    . COLONOSCOPY WITH PROPOFOL N/A 03/30/2019   Procedure: COLONOSCOPY WITH PROPOFOL;  Surgeon: Lin Landsman, MD;  Location: [facility] ENDOSCOPY;  Service: Gastroenterology;  Laterality: N/A;  . ESOPHAGOGASTRODUODENOSCOPY (EGD) WITH PROPOFOL N/A 03/12/2017   Procedure: ESOPHAGOGASTRODUODENOSCOPY (EGD) WITH PROPOFOL;  Surgeon: Lin Landsman, MD;  Location: [facility] ENDOSCOPY;  Service: Gastroenterology;  Laterality: N/A;  . ESOPHAGOGASTRODUODENOSCOPY (EGD) WITH  PROPOFOL N/A 03/30/2019   Procedure: ESOPHAGOGASTRODUODENOSCOPY (EGD) WITH PROPOFOL;  Surgeon: Lin Landsman, MD;  Location: [facility] ENDOSCOPY;  Service: Gastroenterology;  Laterality: N/A;  . HERNIA REPAIR    . UMBILICAL HERNIA REPAIR N/A 05/01/2017   Procedure: HERNIA REPAIR UMBILICAL ADULT;  Surgeon: Vickie Epley, MD;  Location: [facility] ORS;  Service: General;  Laterality: N/A;    There were no vitals filed for this visit.  Subjective Assessment - 04/30/19 1500    Subjective  Pt. states her B thighs were sore and painful after the last session. Pt reported difficulty with raising her right leg up to put her pants on. No recent falls reported.    Pertinent History  B LE neuropathy starting about 2 years ago.  H/o DM type 2.  Pt. retired from working with mentally ill.  Pt. enjoys traveling but limited by Covid.  Pt. watches TV at this time.  Pt. was going to [place] prior to Covid.  Lyrica has been helping (not taking Gabapentin at this time).  Pt. has h/o numerous falls over past year.  See MRI reports.  Pt. has received 1 lumbar nerve block and receives 2nd tomorrow from Dr. Dossie Arbour.  Pt. reports she is limited with shopping due to prolonged standing/walking.  Pt. lives by herself in 1 story house (has stairs to attic).    Limitations  Lifting;Standing;Walking;House hold activities    How long can you stand comfortably?  <1 hour (prefers  to lean against counter)    How long can you walk comfortably?  <1 mile    Diagnostic tests  See MRI reports (recent)    Patient Stated Goals  Improve balance/ coordination of feet and legs.  Increase LE muscle strengthening.  Decrease fall risk.    Currently in Pain?  Yes    Pain Score  --   No objective information was given   Pain Location  Leg    Pain Orientation  Right;Left    Pain Descriptors / Indicators  Tiring;Sore    Pain Type  Chronic pain    Pain Onset  More than a month ago    Pain Frequency  Intermittent         There.ex.:  Nustep UB/LE 10 min  Supine TrA muscle activation with Bridges/Bicycles 10 reps x 1 set Sidelying Hip ABD 8 reps x 1 set on each leg Hamstring/Piriformis/Glu streches 30s x 1 set each side Sciatic Nerve Glide 30 s x 1 set each side   Neuro. Mm.:  Forward Marching 30 ft x 2 sets in //-bars with mirror feedabck Feet Cone Tapping in Forward Walking 30 ft x 4 sets Feet Cone Tapping in Lateral Walking 30 ft x 4 sets Ambulating outside on even and uneven surfaces 100 ft (SBA for safety and cueing)     PT Education - 04/30/19 1512    Education Details  discussed with pt how to dress safely while maintaining balance.    Person(s) Educated  Patient    Methods  Explanation;Demonstration    Comprehension  Verbalized understanding;Returned demonstration          PT Long Term Goals - 04/20/19 1244      PT LONG TERM GOAL #1   Title  Pt. will increase FOTO to 64 to improve safety with functional tasks/ mobility.    Baseline  Initial FOTO: 60    Time  4    Period  Weeks    Status  New    Target Date  05/18/19      PT LONG TERM GOAL #2   Title  Pt. independent with HEP to increase B LE muscle strength to grossly 4+/5 MMT to improve sit to stands/ safety with walking.    Baseline  see flowsheet    Time  4    Period  Weeks    Status  New    Target Date  05/18/19      PT LONG TERM GOAL #3   Title  Pt. able to stand from 17" height chair without UE assist safely with no LOB to improve safety at home.    Baseline  Extra time/ B UE assist required.    Time  4    Period  Weeks    Status  New    Target Date  05/18/19      PT LONG TERM GOAL #4   Title  Pt. will increase Berg balance test to >50/56 to improve safety/ decrease fall risk.    Baseline  Berg on evaluation: 47/56    Time  4    Period  Weeks    Status  New    Target Date  05/18/19      PT LONG TERM GOAL #5   Title  Pt. will ambulate 1 mile with normalized gait pattern and mod. independence safely  to promote return to traveling.    Baseline  Gait limited by poor endurance/ balance issues.  Pt. fatigues easily and requires  rest breaks.  Pt. enjoys traveling.    Time  4    Period  Weeks    Status  New    Target Date  05/18/19            Plan - 04/30/19 1514    Clinical Impression Statement  Pt tolerated exercise progression well during bridge, bicycle and sidelying hip abduction exercises without overcompensating. Pt was able to raise her leg in a more controlled manner during sidelying hip abduction L>R. Pt had mild discomfort in her lower back during sidelying hip abduction. Pt presented with improved balance during cone feet taping in forward and lateral walking, pt used the //-bar occasionally for maintaining balance. Pt was able to ambulate on and off even and uneven surfaces without SPC under SBA/CBA.    Stability/Clinical Decision Making  Evolving/Moderate complexity    Clinical Decision Making  Moderate    Rehab Potential  Good    PT Frequency  2x / week    PT Duration  4 weeks    PT Treatment/Interventions  ADLs/Self Care Home Management;Moist Heat;Gait training;Stair training;Functional mobility training;Neuromuscular re-education;Balance training;Therapeutic exercise;Therapeutic activities;Patient/family education;Manual techniques;Passive range of motion;Energy conservation;Electrical Stimulation;Cryotherapy    PT Next Visit Plan  LE strengthing exercises. Gait/balance training on even and uneven surfaces.       Patient will benefit from skilled therapeutic intervention in order to improve the following deficits and impairments:  Abnormal gait, Improper body mechanics, Pain, Postural dysfunction, Decreased coordination, Decreased activity tolerance, Decreased endurance, Decreased range of motion, Decreased strength, Impaired flexibility, Difficulty walking, Decreased balance  Visit Diagnosis: Muscle weakness (generalized)  Pain in left leg  Pain in right leg  Gait  difficulty  Balance problems     Problem List Patient Active Problem List   Diagnosis Date Noted  . Other spondylosis, sacral and sacrococcygeal region 04/21/2019  . Abnormal MRI, cervical spine (02/08/2019) 03/25/2019  . Lower extremity weakness (Bilateral) 03/25/2019  . Coordination impairment (lower extremity) 03/25/2019  . Hypomagnesemia 03/24/2019  . Spondylosis without myelopathy or radiculopathy, lumbosacral region 03/24/2019  . Lumbar Facet Hypertrophy 03/24/2019  . Grade 1 Anterolisthesis of L4/5 03/11/2019  . Chronic hip pain (Bilateral) (R>L) 03/11/2019  . Chronic sacroiliac joint pain (Bilateral) 03/11/2019  . Sacroiliac joint somatic dysfunction (Bilateral) 03/11/2019  . Chronic feet pain (Primary Area of Pain) (Bilateral) 03/11/2019  . Chronic leg and foot pain (Bilateral) 03/11/2019  . Diabetic peripheral neuropathy (HCC) 03/11/2019  . Chronic neuropathic pain 03/11/2019  . Neurogenic pain 03/11/2019  . Chronic musculoskeletal pain 03/11/2019  . Osteoarthritis involving multiple joints 03/11/2019  . Chronic pain syndrome 03/10/2019  . Pharmacologic therapy 03/10/2019  . Disorder of skeletal system 03/10/2019  . Problems influencing health status 03/10/2019  . Chronic low back pain (Third area of Pain) (Bilateral) w/o sciatica 03/10/2019  . Chronic lower extremity pain (Secondary area of Pain) (Bilateral) 03/10/2019  . Abnormal MRI, lumbar spine (02/08/2019) 03/10/2019  . Essential hypertension 10/06/2018  . Cholelithiasis 06/23/2018  . Fatty liver 06/23/2018  . Helicobacter pylori infection 05/13/2017  . Mastalgia in female 04/09/2017  . Bilateral leg edema 07/02/2016  . DDD (degenerative disc disease), lumbar 12/16/2014  . Lumbar facet syndrome (Bilateral) 12/16/2014  . Sacroiliac joint dysfunction 12/16/2014  . Neuropathy due to secondary diabetes (HCC) 12/16/2014  . Vitamin D deficiency 12/13/2014  . Morbid obesity (HCC) 12/10/2014  . Type 2 diabetes  mellitus with diabetic neuropathy, unspecified (HCC) 12/10/2014  . Hyperlipidemia 12/10/2014  . Gastroesophageal reflux disease without esophagitis 12/10/2014  Cammie Mcgee, PT, DPT # [id number] Resa Miner, SPT 05/01/2019, 5:34 PM  [facility] [address] [place], [place], [address] Phone: [phone]   Fax:  [phone]  Name: Tylasia Fletchall MRN: [id number] Date of Birth: 1960-07-29

## 2019-05-04 ENCOUNTER — Ambulatory Visit: Payer: BLUE CROSS/BLUE SHIELD | Admitting: Physical Therapy

## 2019-05-04 ENCOUNTER — Other Ambulatory Visit: Payer: Self-pay

## 2019-05-04 DIAGNOSIS — M6281 Muscle weakness (generalized): Secondary | ICD-10-CM | POA: Diagnosis not present

## 2019-05-04 DIAGNOSIS — R269 Unspecified abnormalities of gait and mobility: Secondary | ICD-10-CM | POA: Diagnosis not present

## 2019-05-04 DIAGNOSIS — R2689 Other abnormalities of gait and mobility: Secondary | ICD-10-CM

## 2019-05-04 DIAGNOSIS — M79605 Pain in left leg: Secondary | ICD-10-CM | POA: Diagnosis not present

## 2019-05-04 DIAGNOSIS — M79604 Pain in right leg: Secondary | ICD-10-CM | POA: Diagnosis not present

## 2019-05-04 NOTE — Therapy (Signed)
[facility] [address] [place], [place], [address] Phone: [phone]   Fax:  [phone]  Physical Therapy Treatment  Patient Details  Name: Lisa Galvan MRN: [id number] Date of Birth: 09/10/1960 Referring Provider (PT): Dr. Laban Emperor   Encounter Date: 05/04/2019  PT End of Session - 05/04/19 1313    Visit Number  5    Number of Visits  8    Date for PT Re-Evaluation  05/18/19    Authorization - Visit Number  5    Authorization - Number of Visits  10    PT Start Time  0815    PT Stop Time  0917    PT Time Calculation (min)  62 min    Equipment Utilized During Treatment  Gait belt    Activity Tolerance  Patient tolerated treatment well;No increased pain    Behavior During Therapy  WFL for tasks assessed/performed       Past Medical History:  Diagnosis Date  . Allergy   . Arthritis    spine  . Bilateral leg edema   . Chest pain 12/15/2014  . Chronic sciatica 12/10/2014  . Chronic sciatica 12/10/2014  . Diabetes mellitus without complication (HCC)   . Essential hypertension 12/10/2014  . Essential hypertension 12/10/2014  . Gastroesophageal reflux disease without esophagitis   . GERD (gastroesophageal reflux disease)   . Hyperlipidemia   . Hypertension   . Mixed hyperlipidemia   . Obesity (BMI 30.0-34.9) 12/10/2014  . Type 2 diabetes mellitus without complication (HCC) 12/10/2014    Past Surgical History:  Procedure Laterality Date  . ABDOMINAL HYSTERECTOMY    . COLONOSCOPY WITH PROPOFOL N/A 03/30/2019   Procedure: COLONOSCOPY WITH PROPOFOL;  Surgeon: Toney Reil, MD;  Location: [facility] ENDOSCOPY;  Service: Gastroenterology;  Laterality: N/A;  . ESOPHAGOGASTRODUODENOSCOPY (EGD) WITH PROPOFOL N/A 03/12/2017   Procedure: ESOPHAGOGASTRODUODENOSCOPY (EGD) WITH PROPOFOL;  Surgeon: Toney Reil, MD;  Location: [facility] ENDOSCOPY;  Service: Gastroenterology;  Laterality: N/A;  . ESOPHAGOGASTRODUODENOSCOPY (EGD) WITH  PROPOFOL N/A 03/30/2019   Procedure: ESOPHAGOGASTRODUODENOSCOPY (EGD) WITH PROPOFOL;  Surgeon: Toney Reil, MD;  Location: [facility] ENDOSCOPY;  Service: Gastroenterology;  Laterality: N/A;  . HERNIA REPAIR    . UMBILICAL HERNIA REPAIR N/A 05/01/2017   Procedure: HERNIA REPAIR UMBILICAL ADULT;  Surgeon: Ancil Linsey, MD;  Location: [facility] ORS;  Service: General;  Laterality: N/A;    There were no vitals filed for this visit.  Subjective Assessment - 05/04/19 0929    Subjective  Pt. reported mild ache in her right lower back prior to the treatment, but the ache eases after doing standing hamstring stretch.  Pt. reported no pain in her legs since the last session.  Pt. reported that she feels her legs are getting stronger and her balance is getting better.  Pt. reported having difficulty raising her leg to get up the curb and stairs L>R.  No recent falls reported.    Pertinent History  B LE neuropathy starting about 2 years ago.  H/o DM type 2.  Pt. retired from working with mentally ill.  Pt. enjoys traveling but limited by Covid.  Pt. watches TV at this time.  Pt. was going to [place] [place] 3x/week prior to Covid.  Lyrica has been helping (not taking Gabapentin at this time).  Pt. has h/o numerous falls over past year.  See MRI reports.  Pt. has received 1 lumbar nerve block and receives 2nd tomorrow from Dr. Laban Emperor.  Pt. reports she is  limited with shopping due to prolonged standing/walking.  Pt. lives by herself in 1 story house (has stairs to attic).    Limitations  Lifting;Standing;Walking;House hold activities    How long can you stand comfortably?  <1 hour (prefers to lean against counter)    How long can you walk comfortably?  <1 mile    Diagnostic tests  See MRI reports (recent)    Patient Stated Goals  Improve balance/ coordination of feet and legs.  Increase LE muscle strengthening.  Decrease fall risk.    Currently in Pain?  Yes   Pt. reported ache in her right LB   Pain Score  1      Pain Location  Back    Pain Orientation  Right    Pain Descriptors / Indicators  Aching    Pain Type  Chronic pain    Pain Onset  More than a month ago    Pain Frequency  Intermittent    Aggravating Factors   sitting for prolonged perior of time    Pain Relieving Factors  stretching    Effect of Pain on Daily Activities  hurts more with sitting    Multiple Pain Sites  No         Objective:  Therex:  Nustep L2 B UE/LE (warm-up) Supine hamstring (proximal)/glut/piriformis stretching 30 s x  1 set bilaterally Supine sciatic nerve glides 30 s x 1 set bilaterally Supine bridge/ bicycles with TrA activation 10 reps x 1 set Sidelying L/R clam shell 12 reps x 2 sets bilaterally Sit to stand at 21.5 inches height 12 reps x 2 sets  Neuro:   March/forward walking with cone tapping/lateral walking with cone tapping within //-bars: 30 ft x 4 sets.  Cuing for proper technique/ posture Walking on grassy and hard surfaces, getting up and down curbs: 200 ft (SBA for safety/ cuing)      PT Long Term Goals - 04/20/19 1244      PT LONG TERM GOAL #1   Title  Pt. will increase FOTO to 64 to improve safety with functional tasks/ mobility.    Baseline  Initial FOTO: 60    Time  4    Period  Weeks    Status  New    Target Date  05/18/19      PT LONG TERM GOAL #2   Title  Pt. independent with HEP to increase B LE muscle strength to grossly 4+/5 MMT to improve sit to stands/ safety with walking.    Baseline  see flowsheet    Time  4    Period  Weeks    Status  New    Target Date  05/18/19      PT LONG TERM GOAL #3   Title  Pt. able to stand from 17" height chair without UE assist safely with no LOB to improve safety at home.    Baseline  Extra time/ B UE assist required.    Time  4    Period  Weeks    Status  New    Target Date  05/18/19      PT LONG TERM GOAL #4   Title  Pt. will increase Berg balance test to >50/56 to improve safety/ decrease fall risk.    Baseline  Berg on  evaluation: 47/56    Time  4    Period  Weeks    Status  New    Target Date  05/18/19      PT  LONG TERM GOAL #5   Title  Pt. will ambulate 1 mile with normalized gait pattern and mod. independence safely to promote return to traveling.    Baseline  Gait limited by poor endurance/ balance issues.  Pt. fatigues easily and requires rest breaks.  Pt. enjoys traveling.    Time  4    Period  Weeks    Status  New    Target Date  05/18/19            Plan - 05/04/19 0953    Clinical Impression Statement  Pt. tolerated exercises well during today's session. Pt had mild ache going down phase of the bridge exercise at R lower back. Pt required verbal and tactile cueing during clam shell for limiting transverse plane pelvic rotation. Pt required vebral cueing to correctly perform TrA activation exercises. Pt presented improved balance with cone tapping with forward walking within //-bars without touching bars. Pt needed to touch bars occasionally for remaining balance during cone tapping with lateral walking.  Pt. demonstrrated compensating movement when performing sit to stand at blue mat table at 21 inches height by relying heavily on leaning her trunk forward.  Pt. was able to perform sit to stand without compensating at 21.5 inches height with minimal vebral cueing. Pt. had difficulty getting up curbs during gait training outdoor but was able to do it slowly without assist.    Stability/Clinical Decision Making  Evolving/Moderate complexity    Clinical Decision Making  Moderate    Rehab Potential  Good    PT Frequency  2x / week    PT Duration  4 weeks    PT Treatment/Interventions  ADLs/Self Care Home Management;Moist Heat;Gait training;Stair training;Functional mobility training;Neuromuscular re-education;Balance training;Therapeutic exercise;Therapeutic activities;Patient/family education;Manual techniques;Passive range of motion;Energy conservation;Electrical Stimulation;Cryotherapy    PT  Next Visit Plan  LE strengthing exercises. Gait/balance training on even and uneven surfaces.       Patient will benefit from skilled therapeutic intervention in order to improve the following deficits and impairments:  Abnormal gait, Improper body mechanics, Pain, Postural dysfunction, Decreased coordination, Decreased activity tolerance, Decreased endurance, Decreased range of motion, Decreased strength, Impaired flexibility, Difficulty walking, Decreased balance  Visit Diagnosis: Muscle weakness (generalized)  Gait difficulty  Balance problems     Problem List Patient Active Problem List   Diagnosis Date Noted  . Other spondylosis, sacral and sacrococcygeal region 04/21/2019  . Abnormal MRI, cervical spine (02/08/2019) 03/25/2019  . Lower extremity weakness (Bilateral) 03/25/2019  . Coordination impairment (lower extremity) 03/25/2019  . Hypomagnesemia 03/24/2019  . Spondylosis without myelopathy or radiculopathy, lumbosacral region 03/24/2019  . Lumbar Facet Hypertrophy 03/24/2019  . Grade 1 Anterolisthesis of L4/5 03/11/2019  . Chronic hip pain (Bilateral) (R>L) 03/11/2019  . Chronic sacroiliac joint pain (Bilateral) 03/11/2019  . Sacroiliac joint somatic dysfunction (Bilateral) 03/11/2019  . Chronic feet pain (Primary Area of Pain) (Bilateral) 03/11/2019  . Chronic leg and foot pain (Bilateral) 03/11/2019  . Diabetic peripheral neuropathy (HCC) 03/11/2019  . Chronic neuropathic pain 03/11/2019  . Neurogenic pain 03/11/2019  . Chronic musculoskeletal pain 03/11/2019  . Osteoarthritis involving multiple joints 03/11/2019  . Chronic pain syndrome 03/10/2019  . Pharmacologic therapy 03/10/2019  . Disorder of skeletal system 03/10/2019  . Problems influencing health status 03/10/2019  . Chronic low back pain (Third area of Pain) (Bilateral) w/o sciatica 03/10/2019  . Chronic lower extremity pain (Secondary area of Pain) (Bilateral) 03/10/2019  . Abnormal MRI, lumbar spine  (02/08/2019) 03/10/2019  . Essential hypertension 10/06/2018  .  Cholelithiasis 06/23/2018  . Fatty liver 06/23/2018  . Helicobacter pylori infection 05/13/2017  . Mastalgia in female 04/09/2017  . Bilateral leg edema 07/02/2016  . DDD (degenerative disc disease), lumbar 12/16/2014  . Lumbar facet syndrome (Bilateral) 12/16/2014  . Sacroiliac joint dysfunction 12/16/2014  . Neuropathy due to secondary diabetes (HCC) 12/16/2014  . Vitamin D deficiency 12/13/2014  . Morbid obesity (HCC) 12/10/2014  . Type 2 diabetes mellitus with diabetic neuropathy, unspecified (HCC) 12/10/2014  . Hyperlipidemia 12/10/2014  . Gastroesophageal reflux disease without esophagitis 12/10/2014   Lisa Galvan, PT, DPT # [id number] Lisa Galvan, SPT 05/05/2019, 9:11 AM  [place] [facility] [address] [place], [place], [address] Phone: [phone]   Fax:  [phone]  Name: Lisa Galvan MRN: [id number] Date of Birth: [birth year]

## 2019-05-05 ENCOUNTER — Encounter: Payer: Self-pay | Admitting: Pain Medicine

## 2019-05-05 ENCOUNTER — Encounter: Payer: Self-pay | Admitting: Physical Therapy

## 2019-05-05 NOTE — Progress Notes (Signed)
Pain relief after procedure (treated area only): (Questions asked to patient) 1. Starting about 15 minutes after the procedure, and "while the area was still numb" (from the local anesthetics), were you having any of your usual pain "in that area" (the treated area)?  (NOTE: NOT including the discomfort from the needle sticks.) First 1 hour: 100 % better. First 4-6 hours: 100 % better. 2. How long did the numbness from the local anesthetics last? (More than 6 hours?) Duration: 6 hours for right side, left side came back next morning hours.  3. How much better is your pain now, when compared to before the procedure? Current benefit: 60 % better. 4. Can you move better now? Improvement in ROM (Range of Motion): Yes. 5. Can you do more now? Improvement in function: Yes. 4. Did you have any problems with the procedure? Side-effects/Complications: No.

## 2019-05-05 NOTE — Progress Notes (Signed)
Patient: Lisa Galvan  Service Category: E/M  Provider: Oswaldo Done, MD  DOB: Apr 23, 1961  DOS: 05/06/2019  Location: Office  MRN: [id number]  Setting: Ambulatory outpatient  Referring Provider: Dorcas Carrow, DO  Type: Established Patient  Specialty: Interventional Pain Management  PCP: Dorcas Carrow, DO  Location: Remote location  Delivery: TeleHealth     Virtual Encounter - Pain Management PROVIDER NOTE: Information contained herein reflects review and annotations entered in association with encounter. Interpretation of such information and data should be left to medically-trained personnel. Information provided to patient can be located elsewhere in the medical record under "Patient Instructions". Document created using STT-dictation technology, any transcriptional errors that may result from process are unintentional.    Contact & Pharmacy Preferred: [phone] Home: [phone] (home) Mobile: [phone] (mobile) E-mail: [email or web address]  [id number] [place], [place] - [address] AT [facility] [address] [place] [address] Phone: [phone] Fax: [phone]   Pre-screening  [name] offered "in-person" vs "virtual" encounter. She indicated preferring virtual for this encounter.   Reason COVID-19*  Social distancing based on CDC and AMA recommendations.   I contacted Kaity Pitstick on 05/06/2019 via telephone.      I clearly identified myself as Oswaldo Done, MD. I verified that I was speaking with the correct person using two identifiers (Name: Lisa Galvan, and date of birth: 1960/07/31).  Consent I sought verbal advanced consent from [name] for virtual visit interactions. I informed [name] of possible security and privacy concerns, risks, and limitations associated with providing "not-in-person" medical evaluation and management services. I also informed [name] of the availability of "in-person"  appointments. Finally, I informed her that there would be a charge for the virtual visit and that she could be  personally, fully or partially, financially responsible for it. Ms. Brandi expressed understanding and agreed to proceed.   Historic Elements   Lisa Galvan is a 59 y.o. year old, female patient evaluated today after her last encounter by our practice on 04/22/2019. Ms. Diers  has a past medical history of Allergy, Arthritis, Bilateral leg edema, Chest pain (12/15/2014), Chronic sciatica (12/10/2014), Chronic sciatica (12/10/2014), Diabetes mellitus without complication (HCC), Essential hypertension (12/10/2014), Essential hypertension (12/10/2014), Gastroesophageal reflux disease without esophagitis, GERD (gastroesophageal reflux disease), Hyperlipidemia, Hypertension, Mixed hyperlipidemia, Obesity (BMI 30.0-34.9) (12/10/2014), and Type 2 diabetes mellitus without complication (HCC) (12/10/2014). She also  has a past surgical history that includes Abdominal hysterectomy; Esophagogastroduodenoscopy (egd) with propofol (N/A, 03/12/2017); Umbilical hernia repair (N/A, 05/01/2017); Hernia repair; Esophagogastroduodenoscopy (egd) with propofol (N/A, 03/30/2019); and Colonoscopy with propofol (N/A, 03/30/2019). [name] has a current medication list which includes the following prescription(s): amlodipine, atorvastatin, vitamin d-3, duloxetine, jardiance, epinephrine, fexofenadine, hydrochlorothiazide, ibuprofen, ketoconazole, magnesium oxide, metformin, montelukast, omeprazole, pioglitazone, [START ON 05/24/2019] pregabalin, and valacyclovir. She  reports that she has never smoked. She has never used smokeless tobacco. She reports current alcohol use of about 2.0 standard drinks of alcohol per week. She reports that she does not use drugs. [name] is allergic to amlodipine besy-benazepril hcl; ivp dye [iodinated diagnostic agents]; shellfish allergy; shellfish-derived products; and ace inhibitors.   HPI  Today, she  is being contacted for both, medication management and a post-procedure assessment. Today we inquired about her Lyrica.  According to the patient, she is doing well with the Lyrica at 50 mg p.o. 3 times daily.  She indicates not having any side effects and  currently having good results.  Today I will refill her medicine for 180 days.  Regarding the procedure done, the patient indicates that it worked a lot better than when she had only the lumbar facets done.  This would indicate that not only she having problems with the lumbar facets, but she is also having issues with her right SI joint.  With this we have completed the 2 diagnostic lumbar facet blocks and therefore I will go ahead and schedule her for a radiofrequency ablation starting on the right side.  Today I took the time to explain to the patient in detail what the radiofrequency ablation consisted of including the fact that it is not permanent, but it may provide her with relatively good relief of the pain that may last anywhere from 3 months to 18 months.  I explained to her about the procedure and she has decided to proceed with it.  Post-Procedure Evaluation  Procedure: Diagnostic bilateral lumbar facet block #2 + diagnostic right-sided sacroiliac joint block #1 under fluoroscopic guidance and IV sedation Pre-procedure pain level:  4/10 Post-procedure: 0/10 (100% relief)  Sedation: Sedation provided.  Valerie Salts, RN  05/05/2019  9:52 AM  Sign when Signing Visit Pain relief after procedure (treated area only): (Questions asked to patient) 1. Starting about 15 minutes after the procedure, and "while the area was still numb" (from the local anesthetics), were you having any of your usual pain "in that area" (the treated area)?  (NOTE: NOT including the discomfort from the needle sticks.) First 1 hour: 100 % better. First 4-6 hours: 100 % better. 2. How long did the numbness from the local anesthetics last? (More than 6  hours?) Duration: 6 hours for right side, left side came back next morning hours.  3. How much better is your pain now, when compared to before the procedure? Current benefit: 60 % better. 4. Can you move better now? Improvement in ROM (Range of Motion): Yes. 5. Can you do more now? Improvement in function: Yes. 4. Did you have any problems with the procedure? Side-effects/Complications: No.  Current benefits: Defined as benefit that persist at this time.   Analgesia:  >75% relief Function: Ms. Holck reports improvement in function ROM: Ms. Ozga reports improvement in ROM  Medical Necessity: Ms. Bubolz has experienced debilitating chronic pain from the Lumbosacral Facet Syndrome (Spondylosis without myelopathy or radiculopathy, lumbosacral region [M47.817]) that has persisted for longer than three months of failed non-surgical care and has either failed to respond, or was unable to tolerate, or simply did not get enough benefit from other more conservative therapies including, but not limited to: 1. Over-the-counter oral analgesic medications (i.e.: ibuprofen, naproxen, etc.) 2. Anti-inflammatory medications 3. Muscle relaxants 4. Membrane stabilizers 5. Opioids 6. Physical therapy (PT), chiropractic manipulation, and/or home exercise program (HEP). 7. Modalities (Heat, ice, etc.) 8. Invasive techniques such as nerve blocks.  Ms. Lagace has attained greater than 50% reduction in pain from at least two (2) diagnostic medial branch blocks conducted in separate occasions. For this reason, I believe it is medically necessary to proceed with Non-Pulsed Radiofrequency Ablation for the purpose of attempting to prolong the duration of the benefits seen with the diagnostic injections.   Pharmacotherapy Assessment  Analgesic: No opioid analgesics prescribed by our practice.   Monitoring: Pharmacotherapy: No side-effects or adverse reactions reported. [place] PMP: PDMP reviewed during this encounter.        Compliance: No problems identified. Effectiveness: Clinically acceptable. Plan: Refer to "POC".  UDS:  Summary  Date Value Ref Range Status  03/11/2019 Note  Final    Comment:    ==================================================================== Compliance Drug Analysis, Ur ==================================================================== Test                             Result       Flag       Units Drug Present   Gabapentin                     PRESENT   Duloxetine                     PRESENT   Ibuprofen                      PRESENT ==================================================================== Test                      Result    Flag   Units      Ref Range   Creatinine              90               mg/dL      >=15 ==================================================================== Declared Medications:  Medication list was not provided. ==================================================================== For clinical consultation, please call [phone]. ====================================================================    Laboratory Chemistry Profile (12 mo)  Renal: 04/13/2019: BUN 10; BUN/Creatinine Ratio 11; Creatinine, Ser 0.91  Lab Results  Component Value Date   GFRAA 80 04/13/2019   GFRNONAA 69 04/13/2019   Hepatic: 04/13/2019: Albumin 4.4 Lab Results  Component Value Date   AST 14 04/13/2019   ALT 8 04/13/2019   Other: 02/10/2019: CRP <1 03/11/2019: Sed Rate 27 04/13/2019: Vit D, 25-Hydroxy 50.0 Note: Above Lab results reviewed.  Imaging  Fluoro (C-Arm) (<60 min) (No Report) Fluoro was used, but no Radiologist interpretation will be provided.  Please refer to "NOTES" tab for provider progress note.   Assessment  The primary encounter diagnosis was Chronic pain syndrome. Diagnoses of Chronic low back pain (Third area of Pain) (Bilateral) w/o sciatica, Chronic feet pain (Primary Area of Pain) (Bilateral), Diabetic peripheral neuropathy  (HCC), Chronic neuropathic pain, Neurogenic pain, Chronic sacroiliac joint pain (Bilateral), and Lumbar facet syndrome (Bilateral) were also pertinent to this visit.  Plan of Care  Problem-specific:  No problem-specific Assessment & Plan notes found for this encounter.  I am having [name] maintain her EPINEPHrine, valACYclovir, Vitamin D-3, ketoconazole, DULoxetine, fexofenadine, Magnesium Oxide, hydrochlorothiazide, pioglitazone, omeprazole, montelukast, metFORMIN, ibuprofen, Jardiance, atorvastatin, amLODipine, and pregabalin.  Pharmacotherapy (Medications Ordered): Meds ordered this encounter  Medications  . pregabalin (LYRICA) 50 MG capsule    Sig: Take 1 capsule (50 mg total) by mouth 3 (three) times daily.    Dispense:  90 capsule    Refill:  5    Fill one day early if pharmacy is closed on scheduled refill date. May substitute for generic if available.   Orders:  Orders Placed This Encounter  Procedures  . RFA - Lumbar Facet (Schedule)    Standing Status:   Future    Standing Expiration Date:   11/02/2020    Scheduling Instructions:     Side(s): Right-sided     Level: L3-4, L4-5, & L5-S1 Facets (L2, L3, L4, L5, & S1 Medial Branch Nerves)     Sedation: With Sedation     Scheduling Timeframe: As soon as pre-approved  Order Specific Question:   Where will this procedure be performed?    Answer:   [facility]  . RFA - Sacroiliac Joint (Schedule)    Standing Status:   Future    Standing Expiration Date:   11/02/2020    Scheduling Instructions:     Side(s): Right-sided     Level(s): L4, L5, S1, S2, & S3 Medial Branch Nerve(s)     Sedation: With Sedation     Scheduling Timeframe: As soon as pre-approved     Procedure: Sacroiliac joint RFA   Follow-up plan:   Return in about 28 weeks (around 11/18/2019) for (VV), (MM), in addition, RFA (w/ sedation): (R) L-FCT RFA #1 + (R) SI RFA #1.      Interventional management options: Planned, scheduled, and/or pending:     Therapeutic right-sided lumbar facet + sacroiliac joint RFA #1 under fluoroscopic guidance and IV sedation   Considering:   EMG/PNCV of the lower extremities ordered today. Physical therapy referral for treatment of bilateral lower extremity weakness and lack of coordination, ordered today. Diagnostic bilateral lumbar sympathetic block  Diagnostic bilateral lumbar facet block  Possible bilateral lumbar facet RFA  Diagnostic bilateral IA hip joint injection  Possible bilateral obturator + femoral NB  Possible bilateral obturator + femoral nerve RFA  Diagnostic bilateral SI joint block  Possible bilateral SI joint RFA    PRN Procedures:   Palliative bilateral lumbar facet block #3  Palliative right sacroiliac joint block #2     Recent Visits Date Type Provider Dept  04/21/19 Procedure visit Milinda Pointer, MD [facility]  04/14/19 Telemedicine Milinda Pointer, MD [facility]  04/02/19 Procedure visit Milinda Pointer, MD [facility]  03/25/19 Telemedicine Milinda Pointer, [facility]  03/11/19 Office Visit Milinda Pointer, MD [facility]  Showing recent visits within past 90 days and meeting all other requirements   Today's Visits Date Type Provider Dept  05/06/19 Telemedicine Milinda Pointer, MD [facility]  Showing today's visits and meeting all other requirements   Future Appointments No visits were found meeting these conditions.  Showing future appointments within next 90 days and meeting all other requirements   I discussed the assessment and treatment plan with the patient. The patient was provided an opportunity to ask questions and all were answered. The patient agreed with the plan and demonstrated an understanding of the instructions.  Patient advised to call back or seek an in-person evaluation if the symptoms or condition worsens.  Total duration of non-face-to-face encounter: 25  minutes.  Note by: Gaspar Cola, MD Date: 05/06/2019; Time: 2:54 PM

## 2019-05-06 ENCOUNTER — Encounter: Payer: Self-pay | Admitting: Physical Therapy

## 2019-05-06 ENCOUNTER — Ambulatory Visit: Payer: BLUE CROSS/BLUE SHIELD | Admitting: Physical Therapy

## 2019-05-06 ENCOUNTER — Other Ambulatory Visit: Payer: Self-pay

## 2019-05-06 ENCOUNTER — Ambulatory Visit: Payer: BLUE CROSS/BLUE SHIELD | Attending: Pain Medicine | Admitting: Pain Medicine

## 2019-05-06 DIAGNOSIS — M545 Low back pain, unspecified: Secondary | ICD-10-CM

## 2019-05-06 DIAGNOSIS — M79672 Pain in left foot: Secondary | ICD-10-CM

## 2019-05-06 DIAGNOSIS — M533 Sacrococcygeal disorders, not elsewhere classified: Secondary | ICD-10-CM

## 2019-05-06 DIAGNOSIS — G8929 Other chronic pain: Secondary | ICD-10-CM

## 2019-05-06 DIAGNOSIS — M6281 Muscle weakness (generalized): Secondary | ICD-10-CM

## 2019-05-06 DIAGNOSIS — G894 Chronic pain syndrome: Secondary | ICD-10-CM

## 2019-05-06 DIAGNOSIS — M792 Neuralgia and neuritis, unspecified: Secondary | ICD-10-CM

## 2019-05-06 DIAGNOSIS — R269 Unspecified abnormalities of gait and mobility: Secondary | ICD-10-CM

## 2019-05-06 DIAGNOSIS — M79605 Pain in left leg: Secondary | ICD-10-CM | POA: Diagnosis not present

## 2019-05-06 DIAGNOSIS — E1142 Type 2 diabetes mellitus with diabetic polyneuropathy: Secondary | ICD-10-CM

## 2019-05-06 DIAGNOSIS — M79604 Pain in right leg: Secondary | ICD-10-CM | POA: Diagnosis not present

## 2019-05-06 DIAGNOSIS — M79671 Pain in right foot: Secondary | ICD-10-CM

## 2019-05-06 DIAGNOSIS — R2689 Other abnormalities of gait and mobility: Secondary | ICD-10-CM

## 2019-05-06 DIAGNOSIS — M47816 Spondylosis without myelopathy or radiculopathy, lumbar region: Secondary | ICD-10-CM

## 2019-05-06 MED ORDER — PREGABALIN 50 MG PO CAPS: 50.0000 mg | ORAL_CAPSULE | Freq: Three times a day (TID) | ORAL | 5 refills | Status: DC

## 2019-05-06 NOTE — Patient Instructions (Signed)

## 2019-05-06 NOTE — Therapy (Signed)
[facility] [address] [place], [place], [address] Phone: [phone]   Fax:  [phone]  Physical Therapy Treatment  Patient Details  Name: Lisa Galvan MRN: [id number] Date of Birth: [birth year] Referring Provider (PT): Dr. Laban Emperor   Encounter Date: 05/06/2019  PT End of Session - 05/06/19 0950    Visit Number  6    Number of Visits  8    Date for PT Re-Evaluation  05/18/19    Authorization - Visit Number  6    Authorization - Number of Visits  10    PT Start Time  0814    PT Stop Time  0915    PT Time Calculation (min)  61 min    Equipment Utilized During Treatment  Gait belt    Activity Tolerance  Patient tolerated treatment well;No increased pain    Behavior During Therapy  WFL for tasks assessed/performed       Past Medical History:  Diagnosis Date  . Allergy   . Arthritis    spine  . Bilateral leg edema   . Chest pain 12/15/2014  . Chronic sciatica 12/10/2014  . Chronic sciatica 12/10/2014  . Diabetes mellitus without complication (HCC)   . Essential hypertension 12/10/2014  . Essential hypertension 12/10/2014  . Gastroesophageal reflux disease without esophagitis   . GERD (gastroesophageal reflux disease)   . Hyperlipidemia   . Hypertension   . Mixed hyperlipidemia   . Obesity (BMI 30.0-34.9) 12/10/2014  . Type 2 diabetes mellitus without complication (HCC) 12/10/2014    Past Surgical History:  Procedure Laterality Date  . ABDOMINAL HYSTERECTOMY    . COLONOSCOPY WITH PROPOFOL N/A 03/30/2019   Procedure: COLONOSCOPY WITH PROPOFOL;  Surgeon: Toney Reil, MD;  Location: [facility] ENDOSCOPY;  Service: Gastroenterology;  Laterality: N/A;  . ESOPHAGOGASTRODUODENOSCOPY (EGD) WITH PROPOFOL N/A 03/12/2017   Procedure: ESOPHAGOGASTRODUODENOSCOPY (EGD) WITH PROPOFOL;  Surgeon: Toney Reil, MD;  Location: [facility] ENDOSCOPY;  Service: Gastroenterology;  Laterality: N/A;  . ESOPHAGOGASTRODUODENOSCOPY (EGD) WITH  PROPOFOL N/A 03/30/2019   Procedure: ESOPHAGOGASTRODUODENOSCOPY (EGD) WITH PROPOFOL;  Surgeon: Toney Reil, MD;  Location: [facility] ENDOSCOPY;  Service: Gastroenterology;  Laterality: N/A;  . HERNIA REPAIR    . UMBILICAL HERNIA REPAIR N/A 05/01/2017   Procedure: HERNIA REPAIR UMBILICAL ADULT;  Surgeon: Ancil Linsey, MD;  Location: [facility] ORS;  Service: General;  Laterality: N/A;    There were no vitals filed for this visit.  Subjective Assessment - 05/06/19 0920    Subjective  Pt. reported increased numbness in her feet and legs due to the cold weather. Pt. reported no increased pain in her lower back and legs since the last treatment. Pt stated that the previous lumbar and SIJ facet block procedure done in December 2020 have helped her to reduce the pain in her legs and lower back. No recent falls reported.    Pertinent History  B LE neuropathy starting about 2 years ago.  H/o DM type 2.  Pt. retired from working with mentally ill.  Pt. enjoys traveling but limited by Covid.  Pt. watches TV at this time.  Pt. was going to [employer] 3x/week prior to Covid.  Lyrica has been helping (not taking Gabapentin at this time).  Pt. has h/o numerous falls over past year.  See MRI reports.  Pt. has received 1 lumbar nerve block and receives 2nd tomorrow from Dr. Laban Emperor.  Pt. reports she is limited with shopping due to prolonged standing/walking.  Pt. lives by  herself in 1 story house (has stairs to attic).    Limitations  Lifting;Standing;Walking;House hold activities    How long can you stand comfortably?  <1 hour (prefers to lean against counter)    How long can you walk comfortably?  <1 mile    Diagnostic tests  See MRI reports (recent)    Patient Stated Goals  Improve balance/ coordination of feet and legs.  Increase LE muscle strengthening.  Decrease fall risk.    Currently in Pain?  Yes    Pain Score  --   no objective number was given   Pain Location  Leg    Pain Orientation  Right;Left     Pain Descriptors / Indicators  Numbness    Pain Type  Chronic pain;Neuropathic pain    Pain Onset  More than a month ago    Pain Frequency  Intermittent    Aggravating Factors   cold weather    Multiple Pain Sites  Yes    Pain Score  --   no objective number was given   Pain Location  Foot    Pain Orientation  Right;Left    Pain Descriptors / Indicators  Numbness    Pain Type  Chronic pain;Neuropathic pain    Pain Onset  More than a month ago    Pain Frequency  Intermittent    Aggravating Factors   cold weather          Objective:  Therex:  Supine hamstring/glut/piriformis manual stretching 30 s x 1 set Supine lumbar rotation 10 reps x 1 set R and L Supine bridges with manual resistance for isometric hip abduction hold for 5 s 10 reps x 2 sets Supine SLR 10 reps x 1 set Bilaterally Sit to stand at blue mat table, 21.5 inches 10 reps x 1 set, 19.5 inches 10 reps x 1 set Standing RTB lateral walk hands hold on bar if needed 4 rounds x 2 sets See HEP handouts  Neuro:   Standing marching on airex within //-bars 10 reps x 2 sets on each leg Stair navigation 4 step stairs with use of one hand getting up x 5 sets, getting down x 5 sets.     PT Long Term Goals - 04/20/19 1244      PT LONG TERM GOAL #1   Title  Pt. will increase FOTO to 64 to improve safety with functional tasks/ mobility.    Baseline  Initial FOTO: 60    Time  4    Period  Weeks    Status  New    Target Date  05/18/19      PT LONG TERM GOAL #2   Title  Pt. independent with HEP to increase B LE muscle strength to grossly 4+/5 MMT to improve sit to stands/ safety with walking.    Baseline  see flowsheet    Time  4    Period  Weeks    Status  New    Target Date  05/18/19      PT LONG TERM GOAL #3   Title  Pt. able to stand from 17" height chair without UE assist safely with no LOB to improve safety at home.    Baseline  Extra time/ B UE assist required.    Time  4    Period  Weeks    Status   New    Target Date  05/18/19      PT LONG TERM GOAL #4   Title  Pt. will increase Berg balance test to >50/56 to improve safety/ decrease fall risk.    Baseline  Berg on evaluation: 47/56    Time  4    Period  Weeks    Status  New    Target Date  05/18/19      PT LONG TERM GOAL #5   Title  Pt. will ambulate 1 mile with normalized gait pattern and mod. independence safely to promote return to traveling.    Baseline  Gait limited by poor endurance/ balance issues.  Pt. fatigues easily and requires rest breaks.  Pt. enjoys traveling.    Time  4    Period  Weeks    Status  New    Target Date  05/18/19         Plan - 05/06/19 0929    Clinical Impression Statement  Pt. tolerated new exercises well (see handouts). Pt. required verbal cueing to perform stair navigation with alternating her feet with only one hand. Pt was able to perform sit to stand at 19.5 inches height at blue mat table with minimal vebral cueing with proper techniques. Pt demonstrated improved balance by performing standing marching on airex within //-bars without touching bars. Pt. will benefit from continued LE strengthening exercises, stair navigation and gait training and dynamic balance training to decrease fall risks and improve her ability to get up and down stairs.    Stability/Clinical Decision Making  Evolving/Moderate complexity    Clinical Decision Making  Moderate    Rehab Potential  Good    PT Frequency  2x / week    PT Duration  4 weeks    PT Treatment/Interventions  ADLs/Self Care Home Management;Moist Heat;Gait training;Stair training;Functional mobility training;Neuromuscular re-education;Balance training;Therapeutic exercise;Therapeutic activities;Patient/family education;Manual techniques;Passive range of motion;Energy conservation;Electrical Stimulation;Cryotherapy    PT Next Visit Plan  LE strengthing exercises. Stair Camera operator. Gait/balance training on even and uneven surfaces.  DISCUSS MD  f/u visit    PT Home Exercise Plan  Access Code: [email or web address]       Patient will benefit from skilled therapeutic intervention in order to improve the following deficits and impairments:  Abnormal gait, Improper body mechanics, Pain, Postural dysfunction, Decreased coordination, Decreased activity tolerance, Decreased endurance, Decreased range of motion, Decreased strength, Impaired flexibility, Difficulty walking, Decreased balance  Visit Diagnosis: Muscle weakness (generalized)  Gait difficulty  Balance problems     Problem List Patient Active Problem List   Diagnosis Date Noted  . Other spondylosis, sacral and sacrococcygeal region 04/21/2019  . Abnormal MRI, cervical spine (02/08/2019) 03/25/2019  . Lower extremity weakness (Bilateral) 03/25/2019  . Coordination impairment (lower extremity) 03/25/2019  . Hypomagnesemia 03/24/2019  . Spondylosis without myelopathy or radiculopathy, lumbosacral region 03/24/2019  . Lumbar Facet Hypertrophy 03/24/2019  . Grade 1 Anterolisthesis of L4/5 03/11/2019  . Chronic hip pain (Bilateral) (R>L) 03/11/2019  . Chronic sacroiliac joint pain (Bilateral) 03/11/2019  . Sacroiliac joint somatic dysfunction (Bilateral) 03/11/2019  . Chronic feet pain (Primary Area of Pain) (Bilateral) 03/11/2019  . Chronic leg and foot pain (Bilateral) 03/11/2019  . Diabetic peripheral neuropathy (HCC) 03/11/2019  . Chronic neuropathic pain 03/11/2019  . Neurogenic pain 03/11/2019  . Chronic musculoskeletal pain 03/11/2019  . Osteoarthritis involving multiple joints 03/11/2019  . Chronic pain syndrome 03/10/2019  . Pharmacologic therapy 03/10/2019  . Disorder of skeletal system 03/10/2019  . Problems influencing health status 03/10/2019  . Chronic low back pain (Third area of Pain) (Bilateral) w/o sciatica 03/10/2019  . Chronic lower extremity  pain (Secondary area of Pain) (Bilateral) 03/10/2019  . Abnormal MRI, lumbar spine (02/08/2019) 03/10/2019  .  Essential hypertension 10/06/2018  . Cholelithiasis 06/23/2018  . Fatty liver 06/23/2018  . Helicobacter pylori infection 05/13/2017  . Mastalgia in female 04/09/2017  . Bilateral leg edema 07/02/2016  . DDD (degenerative disc disease), lumbar 12/16/2014  . Lumbar facet syndrome (Bilateral) 12/16/2014  . Sacroiliac joint dysfunction 12/16/2014  . Neuropathy due to secondary diabetes (HCC) 12/16/2014  . Vitamin D deficiency 12/13/2014  . Morbid obesity (HCC) 12/10/2014  . Type 2 diabetes mellitus with diabetic neuropathy, unspecified (HCC) 12/10/2014  . Hyperlipidemia 12/10/2014  . Gastroesophageal reflux disease without esophagitis 12/10/2014   Cammie Mcgee, PT, DPT # [id number] Resa Miner, SPT 05/06/2019, 9:55 AM  [facility] [address] [place], [place], [address] Phone: [phone]   Fax:  [phone]  Name: Lisa Galvan MRN: [id number] Date of Birth: April 12, 1961

## 2019-05-06 NOTE — Patient Instructions (Signed)
Access Code: [email or web address]  URL: [email or web address]  Date: 05/06/2019  Prepared by: Dorene Grebe   Exercises  Supine Lower Trunk Rotation - 20 reps - 1 sets - 1x daily - 7x weekly  Straight Leg Raise - 20 reps - 1 sets - 1x daily - 7x weekly  Sit to Stand - 10 reps - 2 sets - 1x daily - 7x weekly  Supine Bridge with Gluteal Set and Spinal Articulation - 10 reps - 1 sets - 1x daily - 7x weekly  Side Stepping with Resistance at Thighs and Counter Support - 10 reps - 2 sets - 1x daily - 7x weekly

## 2019-05-11 ENCOUNTER — Telehealth: Payer: BLUE CROSS/BLUE SHIELD | Admitting: Pain Medicine

## 2019-05-11 ENCOUNTER — Ambulatory Visit: Payer: BLUE CROSS/BLUE SHIELD | Admitting: Physical Therapy

## 2019-05-11 ENCOUNTER — Other Ambulatory Visit: Payer: Self-pay

## 2019-05-11 ENCOUNTER — Encounter: Payer: Self-pay | Admitting: Physical Therapy

## 2019-05-11 DIAGNOSIS — R2689 Other abnormalities of gait and mobility: Secondary | ICD-10-CM | POA: Diagnosis not present

## 2019-05-11 DIAGNOSIS — R269 Unspecified abnormalities of gait and mobility: Secondary | ICD-10-CM

## 2019-05-11 DIAGNOSIS — M6281 Muscle weakness (generalized): Secondary | ICD-10-CM | POA: Diagnosis not present

## 2019-05-11 DIAGNOSIS — M79605 Pain in left leg: Secondary | ICD-10-CM

## 2019-05-11 DIAGNOSIS — M79604 Pain in right leg: Secondary | ICD-10-CM

## 2019-05-11 NOTE — Therapy (Signed)
[facility] [address] [place], [place], [address] Phone: [phone]   Fax:  [phone]  Physical Therapy Treatment  Patient Details  Name: Lisa Galvan MRN: [id number] Date of Birth: Dec 11, 1960 Referring Provider (PT): Dr. Laban Emperor   Encounter Date: 05/11/2019  PT End of Session - 05/11/19 0922    Visit Number  7    Number of Visits  8    Date for PT Re-Evaluation  05/18/19    Authorization - Visit Number  7    Authorization - Number of Visits  10    PT Start Time  0805    PT Stop Time  0910    PT Time Calculation (min)  65 min    Equipment Utilized During Treatment  Gait belt    Activity Tolerance  Patient tolerated treatment well;No increased pain    Behavior During Therapy  WFL for tasks assessed/performed       Past Medical History:  Diagnosis Date  . Allergy   . Arthritis    spine  . Bilateral leg edema   . Chest pain 12/15/2014  . Chronic sciatica 12/10/2014  . Chronic sciatica 12/10/2014  . Diabetes mellitus without complication (HCC)   . Essential hypertension 12/10/2014  . Essential hypertension 12/10/2014  . Gastroesophageal reflux disease without esophagitis   . GERD (gastroesophageal reflux disease)   . Hyperlipidemia   . Hypertension   . Mixed hyperlipidemia   . Obesity (BMI 30.0-34.9) 12/10/2014  . Type 2 diabetes mellitus without complication (HCC) 12/10/2014    Past Surgical History:  Procedure Laterality Date  . ABDOMINAL HYSTERECTOMY    . COLONOSCOPY WITH PROPOFOL N/A 03/30/2019   Procedure: COLONOSCOPY WITH PROPOFOL;  Surgeon: Toney Reil, MD;  Location: [facility] ENDOSCOPY;  Service: Gastroenterology;  Laterality: N/A;  . ESOPHAGOGASTRODUODENOSCOPY (EGD) WITH PROPOFOL N/A 03/12/2017   Procedure: ESOPHAGOGASTRODUODENOSCOPY (EGD) WITH PROPOFOL;  Surgeon: Toney Reil, MD;  Location: [facility] ENDOSCOPY;  Service: Gastroenterology;  Laterality: N/A;  . ESOPHAGOGASTRODUODENOSCOPY (EGD) WITH  PROPOFOL N/A 03/30/2019   Procedure: ESOPHAGOGASTRODUODENOSCOPY (EGD) WITH PROPOFOL;  Surgeon: Toney Reil, MD;  Location: [facility] ENDOSCOPY;  Service: Gastroenterology;  Laterality: N/A;  . HERNIA REPAIR    . UMBILICAL HERNIA REPAIR N/A 05/01/2017   Procedure: HERNIA REPAIR UMBILICAL ADULT;  Surgeon: Ancil Linsey, MD;  Location: [facility] ORS;  Service: General;  Laterality: N/A;    There were no vitals filed for this visit.  Subjective Assessment - 05/11/19 0915    Subjective  Pt. reports numbness at her calf bilaterally for 5/10 prior to today's treatment. Pt. reports that her R foot gets trapped during R leg swing through phase when she walks in to the clinic today. Pt states that she will recieve another facet block once her insurance approve the procedure, and the block will be good for the next 18 months. No recent falls reported.    Pertinent History  B LE neuropathy starting about 2 years ago.  H/o DM type 2.  Pt. retired from working with mentally ill.  Pt. enjoys traveling but limited by Covid.  Pt. watches TV at this time.  Pt. was going to [employer] 3x/week prior to Covid.  Lyrica has been helping (not taking Gabapentin at this time).  Pt. has h/o numerous falls over past year.  See MRI reports.  Pt. has received 1 lumbar nerve block and receives 2nd tomorrow from Dr. Laban Emperor.  Pt. reports she is limited with shopping due to prolonged standing/walking.  Pt. lives by herself in 1 story house (has stairs to attic).    Limitations  Lifting;Standing;Walking;House hold activities    How long can you stand comfortably?  <1 hour (prefers to lean against counter)    How long can you walk comfortably?  <1 mile    Diagnostic tests  See MRI reports (recent)    Patient Stated Goals  Improve balance/ coordination of feet and legs.  Increase LE muscle strengthening.  Decrease fall risk.    Currently in Pain?  Yes    Pain Score  5     Pain Location  Leg    Pain Orientation  Right;Left     Pain Descriptors / Indicators  Numbness    Pain Type  Neuropathic pain;Chronic pain    Pain Onset  More than a month ago    Pain Frequency  Intermittent    Pain Onset  More than a month ago        Therex:   Supine hamstring/piriformis/glut stretches 30 s x 1 set each side Supine sciatic nerve glides 30 s x 1 set bilaterally Supine TrA activation with SLR/bridging/bicycle 10 reps x 1 set each side TRX sit to stand with gray chair 10 reps x 2 sets with SBA  Neuro:  Forward ambulating (step on airex and stay for 5 s, step over yoga blocks) x 3 laps, SBA Stair negotiation 3 laps up and down, SBA    PT Long Term Goals - 04/20/19 1244      PT LONG TERM GOAL #1   Title  Pt. will increase FOTO to 64 to improve safety with functional tasks/ mobility.    Baseline  Initial FOTO: 60    Time  4    Period  Weeks    Status  New    Target Date  05/18/19      PT LONG TERM GOAL #2   Title  Pt. independent with HEP to increase B LE muscle strength to grossly 4+/5 MMT to improve sit to stands/ safety with walking.    Baseline  see flowsheet    Time  4    Period  Weeks    Status  New    Target Date  05/18/19      PT LONG TERM GOAL #3   Title  Pt. able to stand from 17" height chair without UE assist safely with no LOB to improve safety at home.    Baseline  Extra time/ B UE assist required.    Time  4    Period  Weeks    Status  New    Target Date  05/18/19      PT LONG TERM GOAL #4   Title  Pt. will increase Berg balance test to >50/56 to improve safety/ decrease fall risk.    Baseline  Berg on evaluation: 47/56    Time  4    Period  Weeks    Status  New    Target Date  05/18/19      PT LONG TERM GOAL #5   Title  Pt. will ambulate 1 mile with normalized gait pattern and mod. independence safely to promote return to traveling.    Baseline  Gait limited by poor endurance/ balance issues.  Pt. fatigues easily and requires rest breaks.  Pt. enjoys traveling.    Time  4    Period   Weeks    Status  New    Target Date  05/18/19  Plan - 05/11/19 0923    Clinical Impression Statement  Pt. is able to perform sit to stand with TRX with proper techiniques and without needing a break for a full set. Pt requires tactile cueing to decrease UT activation during TRX sit to stand.  Pt is able to perform stair negotiation with step through pattern with only one hand to keep her for balance. Pt. needs more UE support when ascending stairs with her L leg due to L LE muscle weakness. Pt shows improved balance by forward ambulating (stand on airex, step over yoga block) in //- bars without using her hands for maintaining balance. Pt will benefit from continued LE functional exercises, dynamic balance training to decrease fall risks.    Stability/Clinical Decision Making  Evolving/Moderate complexity    Clinical Decision Making  Moderate    Rehab Potential  Good    PT Frequency  2x / week    PT Duration  4 weeks    PT Treatment/Interventions  ADLs/Self Care Home Management;Moist Heat;Gait training;Stair training;Functional mobility training;Neuromuscular re-education;Balance training;Therapeutic exercise;Therapeutic activities;Patient/family education;Manual techniques;Passive range of motion;Energy conservation;Electrical Stimulation;Cryotherapy    PT Next Visit Plan  LE strengthing exercises. Stair Camera operator. Gait/balance training on even and uneven surfaces.    PT Home Exercise Plan  Access Code: [email or web address]    Consulted and Agree with Plan of Care  Patient       Patient will benefit from skilled therapeutic intervention in order to improve the following deficits and impairments:  Abnormal gait, Improper body mechanics, Pain, Postural dysfunction, Decreased coordination, Decreased activity tolerance, Decreased endurance, Decreased range of motion, Decreased strength, Impaired flexibility, Difficulty walking, Decreased balance  Visit Diagnosis: Muscle weakness  (generalized)  Gait difficulty  Balance problems  Pain in left leg  Pain in right leg     Problem List Patient Active Problem List   Diagnosis Date Noted  . Other spondylosis, sacral and sacrococcygeal region 04/21/2019  . Abnormal MRI, cervical spine (02/08/2019) 03/25/2019  . Lower extremity weakness (Bilateral) 03/25/2019  . Coordination impairment (lower extremity) 03/25/2019  . Hypomagnesemia 03/24/2019  . Spondylosis without myelopathy or radiculopathy, lumbosacral region 03/24/2019  . Lumbar Facet Hypertrophy 03/24/2019  . Grade 1 Anterolisthesis of L4/5 03/11/2019  . Chronic hip pain (Bilateral) (R>L) 03/11/2019  . Chronic sacroiliac joint pain (Bilateral) 03/11/2019  . Sacroiliac joint somatic dysfunction (Bilateral) 03/11/2019  . Chronic feet pain (Primary Area of Pain) (Bilateral) 03/11/2019  . Chronic leg and foot pain (Bilateral) 03/11/2019  . Diabetic peripheral neuropathy (HCC) 03/11/2019  . Chronic neuropathic pain 03/11/2019  . Neurogenic pain 03/11/2019  . Chronic musculoskeletal pain 03/11/2019  . Osteoarthritis involving multiple joints 03/11/2019  . Chronic pain syndrome 03/10/2019  . Pharmacologic therapy 03/10/2019  . Disorder of skeletal system 03/10/2019  . Problems influencing health status 03/10/2019  . Chronic low back pain (Third area of Pain) (Bilateral) w/o sciatica 03/10/2019  . Chronic lower extremity pain (Secondary area of Pain) (Bilateral) 03/10/2019  . Abnormal MRI, lumbar spine (02/08/2019) 03/10/2019  . Essential hypertension 10/06/2018  . Cholelithiasis 06/23/2018  . Fatty liver 06/23/2018  . Helicobacter pylori infection 05/13/2017  . Mastalgia in female 04/09/2017  . Bilateral leg edema 07/02/2016  . DDD (degenerative disc disease), lumbar 12/16/2014  . Lumbar facet syndrome (Bilateral) 12/16/2014  . Sacroiliac joint dysfunction 12/16/2014  . Neuropathy due to secondary diabetes (HCC) 12/16/2014  . Vitamin D deficiency  12/13/2014  . Morbid obesity (HCC) 12/10/2014  . Type 2 diabetes mellitus with diabetic neuropathy,  unspecified ([place]) 12/10/2014  . Hyperlipidemia 12/10/2014  . Gastroesophageal reflux disease without esophagitis 12/10/2014   Pura Spice, PT, DPT # [id number], SPT 05/11/2019, 11:47 AM  [facility] [address] [place], [place], [address] Phone: [phone]   Fax:  [phone]  Name: Lisa Galvan MRN: [id number] Date of Birth: 1961-02-07

## 2019-05-13 ENCOUNTER — Ambulatory Visit: Payer: BLUE CROSS/BLUE SHIELD | Admitting: Physical Therapy

## 2019-05-13 ENCOUNTER — Encounter: Payer: Self-pay | Admitting: Physical Therapy

## 2019-05-13 ENCOUNTER — Other Ambulatory Visit: Payer: Self-pay

## 2019-05-13 DIAGNOSIS — R2689 Other abnormalities of gait and mobility: Secondary | ICD-10-CM | POA: Diagnosis not present

## 2019-05-13 DIAGNOSIS — R269 Unspecified abnormalities of gait and mobility: Secondary | ICD-10-CM | POA: Diagnosis not present

## 2019-05-13 DIAGNOSIS — M79605 Pain in left leg: Secondary | ICD-10-CM

## 2019-05-13 DIAGNOSIS — M79604 Pain in right leg: Secondary | ICD-10-CM | POA: Diagnosis not present

## 2019-05-13 DIAGNOSIS — M6281 Muscle weakness (generalized): Secondary | ICD-10-CM

## 2019-05-13 NOTE — Therapy (Signed)
[facility] [address] [place], [place], [address] Phone: [phone]   Fax:  [phone]  Physical Therapy Treatment  Patient Details  Name: Lisa Galvan MRN: [id number] Date of Birth: 08-28-1960 Referring Provider (PT): Dr. Laban Emperor   Encounter Date: 05/13/2019  PT End of Session - 05/13/19 0921    Visit Number  8    Number of Visits  8    Date for PT Re-Evaluation  05/18/19    Authorization - Visit Number  8    Authorization - Number of Visits  10    PT Start Time  0809    PT Stop Time  0915    PT Time Calculation (min)  66 min    Equipment Utilized During Treatment  Gait belt    Activity Tolerance  Patient tolerated treatment well    Behavior During Therapy  [facility] for tasks assessed/performed       Past Medical History:  Diagnosis Date  . Allergy   . Arthritis    spine  . Bilateral leg edema   . Chest pain 12/15/2014  . Chronic sciatica 12/10/2014  . Chronic sciatica 12/10/2014  . Diabetes mellitus without complication (HCC)   . Essential hypertension 12/10/2014  . Essential hypertension 12/10/2014  . Gastroesophageal reflux disease without esophagitis   . GERD (gastroesophageal reflux disease)   . Hyperlipidemia   . Hypertension   . Mixed hyperlipidemia   . Obesity (BMI 30.0-34.9) 12/10/2014  . Type 2 diabetes mellitus without complication (HCC) 12/10/2014    Past Surgical History:  Procedure Laterality Date  . ABDOMINAL HYSTERECTOMY    . COLONOSCOPY WITH PROPOFOL N/A 03/30/2019   Procedure: COLONOSCOPY WITH PROPOFOL;  Surgeon: Toney Reil, MD;  Location: [facility] ENDOSCOPY;  Service: Gastroenterology;  Laterality: N/A;  . ESOPHAGOGASTRODUODENOSCOPY (EGD) WITH PROPOFOL N/A 03/12/2017   Procedure: ESOPHAGOGASTRODUODENOSCOPY (EGD) WITH PROPOFOL;  Surgeon: Toney Reil, MD;  Location: [facility] ENDOSCOPY;  Service: Gastroenterology;  Laterality: N/A;  . ESOPHAGOGASTRODUODENOSCOPY (EGD) WITH PROPOFOL N/A 03/30/2019    Procedure: ESOPHAGOGASTRODUODENOSCOPY (EGD) WITH PROPOFOL;  Surgeon: Toney Reil, MD;  Location: [facility] ENDOSCOPY;  Service: Gastroenterology;  Laterality: N/A;  . HERNIA REPAIR    . UMBILICAL HERNIA REPAIR N/A 05/01/2017   Procedure: HERNIA REPAIR UMBILICAL ADULT;  Surgeon: Ancil Linsey, MD;  Location: [facility] ORS;  Service: General;  Laterality: N/A;    There were no vitals filed for this visit.  Subjective Assessment - 05/13/19 0916    Subjective  Pt. reports that she can pick up her legs eaiser when getting into her Jeep as compared to two months ago. Pt. reports mild numbness in her legs. No increased pain in her low back and legs since the last session. No recent falls reported.    Pertinent History  B LE neuropathy starting about 2 years ago.  H/o DM type 2.  Pt. retired from working with mentally ill.  Pt. enjoys traveling but limited by Covid.  Pt. watches TV at this time.  Pt. was going to [employer] 3x/week prior to Covid.  Lyrica has been helping (not taking Gabapentin at this time).  Pt. has h/o numerous falls over past year.  See MRI reports.  Pt. has received 1 lumbar nerve block and receives 2nd tomorrow from Dr. Laban Emperor.  Pt. reports she is limited with shopping due to prolonged standing/walking.  Pt. lives by herself in 1 story house (has stairs to attic).    Limitations  Lifting;Standing;Walking;House hold activities  How long can you stand comfortably?  <1 hour (prefers to lean against counter)    How long can you walk comfortably?  <1 mile    Diagnostic tests  See MRI reports (recent)    Patient Stated Goals  Improve balance/ coordination of feet and legs.  Increase LE muscle strengthening.  Decrease fall risk.    Currently in Pain?  Yes    Pain Score  --   no onjective number was given   Pain Location  Leg    Pain Orientation  Right;Left    Pain Descriptors / Indicators  Numbness    Pain Type  Chronic pain;Neuropathic pain    Pain Onset  More than a month  ago    Pain Frequency  Intermittent    Pain Onset  More than a month ago         Therex:  Nustep 10 min L5 TRX sit to stand on a gray chair, eccentric 3 s down with SBA  10 reps x 2 sets Seated marching with manual resistance, eccentric 3 s down and 1 s up, 10 reps x 2 sets Monster walk 4 laps with RTB within //- bars x 1 set, SBA Lateral walk 3 laps with RTB within //- bars x 2 sets, SBA   Neuro:  Obstacle course: stand on airex 5 s, step over two yoga blocks, step up and down a step x 3 laps x 2 sets Walking in clinic/ outside with cuing for BOS/ step pattern and posture.  Pt. Improving arm swing/ overall balance with gait outside.     PT Education - 05/13/19 1319    Education Details  See new HEP    Person(s) Educated  Patient    Methods  Demonstration;Explanation;Handout    Comprehension  Verbalized understanding;Returned demonstration          PT Long Term Goals - 04/20/19 1244      PT LONG TERM GOAL #1   Title  Pt. will increase FOTO to 64 to improve safety with functional tasks/ mobility.    Baseline  Initial FOTO: 60    Time  4    Period  Weeks    Status  New    Target Date  05/18/19      PT LONG TERM GOAL #2   Title  Pt. independent with HEP to increase B LE muscle strength to grossly 4+/5 MMT to improve sit to stands/ safety with walking.    Baseline  see flowsheet    Time  4    Period  Weeks    Status  New    Target Date  05/18/19      PT LONG TERM GOAL #3   Title  Pt. able to stand from 17" height chair without UE assist safely with no LOB to improve safety at home.    Baseline  Extra time/ B UE assist required.    Time  4    Period  Weeks    Status  New    Target Date  05/18/19      PT LONG TERM GOAL #4   Title  Pt. will increase Berg balance test to >50/56 to improve safety/ decrease fall risk.    Baseline  Berg on evaluation: 47/56    Time  4    Period  Weeks    Status  New    Target Date  05/18/19      PT LONG TERM GOAL #5   Title  Pt. will ambulate 1 mile with normalized gait pattern and mod. independence safely to promote return to traveling.    Baseline  Gait limited by poor endurance/ balance issues.  Pt. fatigues easily and requires rest breaks.  Pt. enjoys traveling.    Time  4    Period  Weeks    Status  New    Target Date  05/18/19            Plan - 05/13/19 1610    Clinical Impression Statement  Pt. has difficulty stepping on to BOSU ball (round side) without using one hand to hold on the wall during obstracle course training. Pt. is able to perform all other components of the obstacle course in the hallway without using of her hands and with SBA/CGA. Pt. is able to perform TRX sit to stand with a gray chair with 3 sec. eccentric phase from stand to sit in a control manner. Pt. completes RTB monster walk and RTB lateral walk with one hand holding the bar for support. Pt. will benefit from continued LE strengthening and dynamic balance training to decrease fall risks and increase independence of ADL and IADLs.    Stability/Clinical Decision Making  Evolving/Moderate complexity    Clinical Decision Making  Moderate    Rehab Potential  Good    PT Frequency  2x / week    PT Duration  4 weeks    PT Treatment/Interventions  ADLs/Self Care Home Management;Moist Heat;Gait training;Stair training;Functional mobility training;Neuromuscular re-education;Balance training;Therapeutic exercise;Therapeutic activities;Patient/family education;Manual techniques;Passive range of motion;Energy conservation;Electrical Stimulation;Cryotherapy    PT Next Visit Plan  Re-assess, recert date. Stair navigation training, obstacle course, progress TRX to sit to stand with one leg slightly in the front.    PT Home Exercise Plan  Access Code: [email or web address]    Consulted and Agree with Plan of Care  Patient       Patient will benefit from skilled therapeutic intervention in order to improve the following deficits and impairments:  Abnormal  gait, Improper body mechanics, Pain, Postural dysfunction, Decreased coordination, Decreased activity tolerance, Decreased endurance, Decreased range of motion, Decreased strength, Impaired flexibility, Difficulty walking, Decreased balance  Visit Diagnosis: Muscle weakness (generalized)  Balance problems  Pain in left leg  Pain in right leg  Gait difficulty     Problem List Patient Active Problem List   Diagnosis Date Noted  . Other spondylosis, sacral and sacrococcygeal region 04/21/2019  . Abnormal MRI, cervical spine (02/08/2019) 03/25/2019  . Lower extremity weakness (Bilateral) 03/25/2019  . Coordination impairment (lower extremity) 03/25/2019  . Hypomagnesemia 03/24/2019  . Spondylosis without myelopathy or radiculopathy, lumbosacral region 03/24/2019  . Lumbar Facet Hypertrophy 03/24/2019  . Grade 1 Anterolisthesis of L4/5 03/11/2019  . Chronic hip pain (Bilateral) (R>L) 03/11/2019  . Chronic sacroiliac joint pain (Bilateral) 03/11/2019  . Sacroiliac joint somatic dysfunction (Bilateral) 03/11/2019  . Chronic feet pain (Primary Area of Pain) (Bilateral) 03/11/2019  . Chronic leg and foot pain (Bilateral) 03/11/2019  . Diabetic peripheral neuropathy (HCC) 03/11/2019  . Chronic neuropathic pain 03/11/2019  . Neurogenic pain 03/11/2019  . Chronic musculoskeletal pain 03/11/2019  . Osteoarthritis involving multiple joints 03/11/2019  . Chronic pain syndrome 03/10/2019  . Pharmacologic therapy 03/10/2019  . Disorder of skeletal system 03/10/2019  . Problems influencing health status 03/10/2019  . Chronic low back pain (Third area of Pain) (Bilateral) w/o sciatica 03/10/2019  . Chronic lower extremity pain (Secondary area of Pain) (Bilateral) 03/10/2019  . Abnormal MRI, lumbar spine (02/08/2019) 03/10/2019  .  Essential hypertension 10/06/2018  . Cholelithiasis 06/23/2018  . Fatty liver 06/23/2018  . Helicobacter pylori infection 05/13/2017  . Mastalgia in female  04/09/2017  . Bilateral leg edema 07/02/2016  . DDD (degenerative disc disease), lumbar 12/16/2014  . Lumbar facet syndrome (Bilateral) 12/16/2014  . Sacroiliac joint dysfunction 12/16/2014  . Neuropathy due to secondary diabetes (HCC) 12/16/2014  . Vitamin D deficiency 12/13/2014  . Morbid obesity (HCC) 12/10/2014  . Type 2 diabetes mellitus with diabetic neuropathy, unspecified (HCC) 12/10/2014  . Hyperlipidemia 12/10/2014  . Gastroesophageal reflux disease without esophagitis 12/10/2014   Cammie Mcgee, PT, DPT # [id number] Resa Miner, SPT 05/13/2019, 1:43 PM  [facility] [address] [place], [place], [address] Phone: [phone]   Fax:  [phone]  Name: Caroleen Stoermer MRN: [id number] Date of Birth: 01-27-1961

## 2019-05-13 NOTE — Patient Instructions (Signed)
Access Code: [email or web address]  URL: [email or web address]  Date: 05/13/2019  Prepared by: Dorene Grebe   Exercises  Straight Leg Raise - 20 reps - 1 sets - 1x daily - 7x weekly  Sit to Stand - 10 reps - 2 sets - 1x daily - 7x weekly  Supine Bridge with Gluteal Set and Spinal Articulation - 10 reps - 2 sets - 1x daily - 7x weekly  Side Stepping with Resistance at Thighs and Counter Support - 16 reps - 2 sets - 1x daily - 7x weekly  Forward Monster Walk with Resistance at Ankles and Counter Support - 16 reps - 2 sets - 1x daily - 7x weekly

## 2019-05-15 ENCOUNTER — Encounter: Payer: Self-pay | Admitting: Family Medicine

## 2019-05-15 ENCOUNTER — Ambulatory Visit (INDEPENDENT_AMBULATORY_CARE_PROVIDER_SITE_OTHER): Payer: BLUE CROSS/BLUE SHIELD | Admitting: Family Medicine

## 2019-05-15 VITALS — BP 120/79 | HR 88 | Temp 96.6°F

## 2019-05-15 DIAGNOSIS — I10 Essential (primary) hypertension: Secondary | ICD-10-CM | POA: Diagnosis not present

## 2019-05-15 MED ORDER — HYDROCHLOROTHIAZIDE 25 MG PO TABS: 25.0000 mg | ORAL_TABLET | Freq: Every day | ORAL | 1 refills | Status: DC

## 2019-05-15 NOTE — Progress Notes (Signed)
BP 120/79   Pulse 88   Temp (!) 96.6 F (35.9 C)    Subjective:    Patient ID: Lisa Galvan, female    DOB: [birth year], 59 y.o.   MRN: [id number]  HPI: Lisa Galvan is a 59 y.o. female  Chief Complaint  Patient presents with  . Hypertension   HYPERTENSION Hypertension status: controlled  Satisfied with current treatment? yes Duration of hypertension: chronic BP monitoring frequency:  a few times a week BP range: 129/74 is the highest, 107/70 the lowest BP medication side effects:  no Medication compliance: excellent compliance Previous BP meds:HCTZ Aspirin: no Recurrent headaches: no Visual changes: no Palpitations: no Dyspnea: no Chest pain: no Lower extremity edema: no Dizzy/lightheaded: no  Relevant past medical, surgical, family and social history reviewed and updated as indicated. Interim medical history since our last visit reviewed. Allergies and medications reviewed and updated.  Review of Systems  Constitutional: Negative.   HENT: Negative.   Respiratory: Negative.   Cardiovascular: Negative.   Musculoskeletal: Negative.   Psychiatric/Behavioral: Negative.     Per HPI unless specifically indicated above     Objective:    BP 120/79   Pulse 88   Temp (!) 96.6 F (35.9 C)   Wt Readings from Last 3 Encounters:  04/21/19 170 lb (77.1 kg)  04/02/19 170 lb (77.1 kg)  03/30/19 170 lb (77.1 kg)    Physical Exam Vitals and nursing note reviewed.  Constitutional:      General: She is not in acute distress.    Appearance: Normal appearance. She is not ill-appearing, toxic-appearing or diaphoretic.  HENT:     Head: Normocephalic and atraumatic.     Right Ear: External ear normal.     Left Ear: External ear normal.     Nose: Nose normal.     Mouth/Throat:     Mouth: Mucous membranes are moist.     Pharynx: Oropharynx is clear.  Eyes:     General: No scleral icterus.       Right eye: No discharge.        Left eye: No discharge.   Conjunctiva/sclera: Conjunctivae normal.     Pupils: Pupils are equal, round, and reactive to light.  Pulmonary:     Effort: Pulmonary effort is normal. No respiratory distress.     Comments: Speaking in full sentences Musculoskeletal:        General: Normal range of motion.     Cervical back: Normal range of motion.  Skin:    Coloration: Skin is not jaundiced or pale.     Findings: No bruising, erythema, lesion or rash.  Neurological:     Mental Status: She is alert and oriented to person, place, and time. Mental status is at baseline.  Psychiatric:        Mood and Affect: Mood normal.        Behavior: Behavior normal.        Thought Content: Thought content normal.        Judgment: Judgment normal.     Results for orders placed or performed in visit on 04/13/19  Microscopic Examination   BLD  Result Value Ref Range   WBC, UA 6-10 (A) 0 - 5 /hpf   RBC None seen 0 - 2 /hpf   Epithelial Cells (non renal) 0-10 0 - 10 /hpf   Casts Present None seen /lpf   Cast Type Hyaline casts N/A   Bacteria, UA Few (A) None seen/Few  Urine Culture, Reflex  BLD  Result Value Ref Range   Urine Culture, Routine Final report    Organism ID, Bacteria Comment   Bayer DCA Hb A1c Waived  Result Value Ref Range   HB A1C (BAYER DCA - WAIVED) 5.9 <7.0 %  CBC with Differential OUT  Result Value Ref Range   WBC 6.1 3.4 - 10.8 x10E3/uL   RBC 4.25 3.77 - 5.28 x10E6/uL   Hemoglobin 12.7 11.1 - 15.9 g/dL   Hematocrit 38.0 34.0 - 46.6 %   MCV 89 79 - 97 fL   MCH 29.9 26.6 - 33.0 pg   MCHC 33.4 31.5 - 35.7 g/dL   RDW 15.3 11.7 - 15.4 %   Platelets 271 150 - 450 x10E3/uL   Neutrophils 62 Not Estab. %   Lymphs 29 Not Estab. %   Monocytes 9 Not Estab. %   Eos 0 Not Estab. %   Basos 0 Not Estab. %   Neutrophils Absolute 3.7 1.4 - 7.0 x10E3/uL   Lymphocytes Absolute 1.7 0.7 - 3.1 x10E3/uL   Monocytes Absolute 0.6 0.1 - 0.9 x10E3/uL   EOS (ABSOLUTE) 0.0 0.0 - 0.4 x10E3/uL   Basophils Absolute 0.0  0.0 - 0.2 x10E3/uL   Immature Granulocytes 0 Not Estab. %   Immature Grans (Abs) 0.0 0.0 - 0.1 x10E3/uL  Comp Met (CMET)  Result Value Ref Range   Glucose 124 (H) 65 - 99 mg/dL   BUN 10 6 - 24 mg/dL   Creatinine, Ser 0.91 0.57 - 1.00 mg/dL   GFR calc non Af Amer 69 >59 mL/min/1.73   GFR calc Af Amer 80 >59 mL/min/1.73   BUN/Creatinine Ratio 11 9 - 23   Sodium 140 134 - 144 mmol/L   Potassium 4.0 3.5 - 5.2 mmol/L   Chloride 96 96 - 106 mmol/L   CO2 29 20 - 29 mmol/L   Calcium 10.1 8.7 - 10.2 mg/dL   Total Protein 6.6 6.0 - 8.5 g/dL   Albumin 4.4 3.8 - 4.9 g/dL   Globulin, Total 2.2 1.5 - 4.5 g/dL   Albumin/Globulin Ratio 2.0 1.2 - 2.2   Bilirubin Total 0.7 0.0 - 1.2 mg/dL   Alkaline Phosphatase 63 39 - 117 IU/L   AST 14 0 - 40 IU/L   ALT 8 0 - 32 IU/L  Lipid Panel w/o Chol/HDL Ratio OUT  Result Value Ref Range   Cholesterol, Total 221 (H) 100 - 199 mg/dL   Triglycerides 62 0 - 149 mg/dL   HDL 133 >39 mg/dL   VLDL Cholesterol Cal 11 5 - 40 mg/dL   LDL Chol Calc (NIH) 77 0 - 99 mg/dL  Microalbumin, Urine Waived  Result Value Ref Range   Microalb, Ur Waived 30 (H) 0 - 19 mg/L   Creatinine, Urine Waived 100 10 - 300 mg/dL   Microalb/Creat Ratio 30-300 (H) <30 mg/g  TSH  Result Value Ref Range   TSH 0.780 0.450 - 4.500 uIU/mL  UA/M w/rflx Culture, Routine   Specimen: Blood   BLD  Result Value Ref Range   Specific Gravity, UA 1.015 1.005 - 1.030   pH, UA 5.5 5.0 - 7.5   Color, UA Yellow Yellow   Appearance Ur Hazy (A) Clear   Leukocytes,UA Trace (A) Negative   Protein,UA Negative Negative/Trace   Glucose, UA 3+ (A) Negative   Ketones, UA Negative Negative   RBC, UA Negative Negative   Bilirubin, UA Negative Negative   Urobilinogen, Ur 0.2 0.2 - 1.0 mg/dL   Nitrite, UA  Negative Negative   Microscopic Examination See below:    Urinalysis Reflex Comment   Vit D  25 hydroxy (rtn osteoporosis monitoring)  Result Value Ref Range   Vit D, 25-Hydroxy 50.0 30.0 - 100.0  ng/mL      Assessment & Plan:   Problem List Items Addressed This Visit      Cardiovascular and Mediastinum   Essential hypertension - Primary    Under good control on current regimen. Continue current regimen. Continue to monitor. Call with any concerns. Refills given. Labs to be checked Monday.       Relevant Medications   hydrochlorothiazide (HYDRODIURIL) 25 MG tablet   Other Relevant Orders   Basic metabolic panel       Follow up plan: Return in about 5 months (around 10/13/2019) for physical.   . This visit was completed via FaceTime due to the restrictions of the COVID-19 pandemic. All issues as above were discussed and addressed. Physical exam was done as above through visual confirmation on FaceTime. If it was felt that the patient should be evaluated in the office, they were directed there. The patient verbally consented to this visit. . Location of the patient: work . Location of the provider: work . Those involved with this call:  . Provider: Park Liter, DO . CMA: Tiffany Reel, CMA . Front Desk/Registration: Don Perking  . Time spent on call: 15 minutes with patient face to face via video conference. More than 50% of this time was spent in counseling and coordination of care. 23 minutes total spent in review of patient's record and preparation of their chart.

## 2019-05-15 NOTE — Assessment & Plan Note (Signed)
Under good control on current regimen. Continue current regimen. Continue to monitor. Call with any concerns. Refills given. Labs to be checked Monday.

## 2019-05-15 NOTE — Patient Instructions (Addendum)
Call to schedule your mammogram: [facility]  Address: [address] [place], [place], [place] [address]  Phone: [phone]  We are recommending the vaccine to everyone who has not had an allergic reaction to any of the components of the vaccine. If you have specific questions about the vaccine, please bring them up with your health care provider to discuss them.   We will likely not be getting the vaccine in the office for the first rounds of vaccinations. The way they are releasing the vaccines is going to be through the health systems (like [place], [place], [facility], [place]) or through your county health department.   The [facility] Department is giving vaccines to those 75+ starting 04/29/19  M-F 7AM to 4PM [employer] [address], [place], [place] [facility] in a drive through tent  If you are 65+ you can get a vaccine through [facility] by signing up for an appointment.  You can sign up by going to: [email or web address]  You can get more information by going to: [email or web address]

## 2019-05-18 ENCOUNTER — Other Ambulatory Visit: Payer: BLUE CROSS/BLUE SHIELD

## 2019-05-18 ENCOUNTER — Ambulatory Visit: Payer: BLUE CROSS/BLUE SHIELD | Admitting: Physical Therapy

## 2019-05-18 ENCOUNTER — Other Ambulatory Visit: Payer: Self-pay

## 2019-05-18 DIAGNOSIS — M6281 Muscle weakness (generalized): Secondary | ICD-10-CM

## 2019-05-18 DIAGNOSIS — R269 Unspecified abnormalities of gait and mobility: Secondary | ICD-10-CM

## 2019-05-18 DIAGNOSIS — I10 Essential (primary) hypertension: Secondary | ICD-10-CM

## 2019-05-18 DIAGNOSIS — M79604 Pain in right leg: Secondary | ICD-10-CM | POA: Diagnosis not present

## 2019-05-18 DIAGNOSIS — M79605 Pain in left leg: Secondary | ICD-10-CM

## 2019-05-18 DIAGNOSIS — R2689 Other abnormalities of gait and mobility: Secondary | ICD-10-CM | POA: Diagnosis not present

## 2019-05-18 NOTE — Therapy (Signed)
[facility] [address] [place], [place], [address] Phone: [phone]   Fax:  [phone]  Physical Therapy Treatment  Patient Details  Name: Lisa Galvan MRN: [id number] Date of Birth: 11-18-1960 Referring Provider (PT): Dr. Dossie Arbour   Encounter Date: 05/18/2019  PT End of Session - 05/18/19 0937    Visit Number  9    Number of Visits  17    Date for PT Re-Evaluation  06/15/19    Authorization - Visit Number  1    Authorization - Number of Visits  10    PT Start Time  0809    PT Stop Time  0914    PT Time Calculation (min)  65 min    Equipment Utilized During Treatment  Gait belt    Activity Tolerance  Patient tolerated treatment well    Behavior During Therapy  [facility] for tasks assessed/performed       Past Medical History:  Diagnosis Date  . Allergy   . Arthritis    spine  . Bilateral leg edema   . Chest pain 12/15/2014  . Chronic sciatica 12/10/2014  . Chronic sciatica 12/10/2014  . Diabetes mellitus without complication ([place])   . Essential hypertension 12/10/2014  . Essential hypertension 12/10/2014  . Gastroesophageal reflux disease without esophagitis   . GERD (gastroesophageal reflux disease)   . Hyperlipidemia   . Hypertension   . Mixed hyperlipidemia   . Obesity (BMI 30.0-34.9) 12/10/2014  . Type 2 diabetes mellitus without complication ([place]) 3/00/7622    Past Surgical History:  Procedure Laterality Date  . ABDOMINAL HYSTERECTOMY    . COLONOSCOPY WITH PROPOFOL N/A 03/30/2019   Procedure: COLONOSCOPY WITH PROPOFOL;  Surgeon: Lin Landsman, MD;  Location: [facility] ENDOSCOPY;  Service: Gastroenterology;  Laterality: N/A;  . ESOPHAGOGASTRODUODENOSCOPY (EGD) WITH PROPOFOL N/A 03/12/2017   Procedure: ESOPHAGOGASTRODUODENOSCOPY (EGD) WITH PROPOFOL;  Surgeon: Lin Landsman, MD;  Location: [facility] ENDOSCOPY;  Service: Gastroenterology;  Laterality: N/A;  . ESOPHAGOGASTRODUODENOSCOPY (EGD) WITH PROPOFOL N/A 03/30/2019    Procedure: ESOPHAGOGASTRODUODENOSCOPY (EGD) WITH PROPOFOL;  Surgeon: Lin Landsman, MD;  Location: [facility] ENDOSCOPY;  Service: Gastroenterology;  Laterality: N/A;  . HERNIA REPAIR    . UMBILICAL HERNIA REPAIR N/A 05/01/2017   Procedure: HERNIA REPAIR UMBILICAL ADULT;  Surgeon: Vickie Epley, MD;  Location: [facility] ORS;  Service: General;  Laterality: N/A;    There were no vitals filed for this visit.  Subjective Assessment - 05/18/19 0808    Subjective  Pt. reports the next doctor visit is 06/02/2019 for another facet block injection. Pt. reports she is able to walk for 1 mile but if she walks too much (2 miles) she may experience sharp pain in her bilateral calf. Pt. reports that she feels 3/10 numbness in her bilateral legs. Pt. reports that she tripped by hitting the heel of her R foot to the ground during R leg swing phase when she was walking in a store since the last session. No recent falls reported.    Pertinent History  B LE neuropathy starting about 2 years ago.  H/o DM type 2.  Pt. retired from working with mentally ill.  Pt. enjoys traveling but limited by Covid.  Pt. watches TV at this time.  Pt. was going to [place] prior to Covid.  Lyrica has been helping (not taking Gabapentin at this time).  Pt. has h/o numerous falls over past year.  See MRI reports.  Pt. has received 1 lumbar nerve  block and receives 2nd tomorrow from Dr. Dossie Arbour.  Pt. reports she is limited with shopping due to prolonged standing/walking.  Pt. lives by herself in 1 story house (has stairs to attic).    Limitations  Lifting;Standing;Walking;House hold activities    How long can you stand comfortably?  <1 hour (prefers to lean against counter)    How long can you walk comfortably?  <1 mile    Diagnostic tests  See MRI reports (recent)    Patient Stated Goals  Improve balance/ coordination of feet and legs.  Increase LE muscle strengthening.  Decrease fall risk.    Currently in Pain?  Yes    Pain  Score  3     Pain Location  Leg    Pain Orientation  Right;Left    Pain Descriptors / Indicators  Numbness    Pain Onset  More than a month ago    Pain Frequency  Intermittent    Pain Onset  More than a month ago        Therex:  [facility] PT Assessment - 05/19/19 0001      Assessment   Medical Diagnosis  Weakness of both lower extremities/ Coordination impairment    Referring Provider (PT)  Dr. Dossie Arbour    Onset Date/Surgical Date  04/23/18    Hand Dominance  Right    Next MD Visit  --   06/02/2019     Precautions   Precautions  Fall      Balance Screen   Has the patient fallen in the past 6 months  Yes      Prior Function   Level of Independence  Independent      Cognition   Overall Cognitive Status  Within Functional Limits for tasks assessed         MMT:  Quad:           R (5/5)     L (5/5) Hamstring:    R (4+/5)   L (4+/5) Hip flexion:    R (4-/5)   L (4-/5) Hip ABD:       R (4+/5)  L (4+/5) Hip ADD:       R (4+/5)  L (4+/5)   Hamstring flexibility during supine passive SLR:  R: 87 deg.    L: 80 deg. Nustep L 3-5 14 min FOTO: 44 10 supine SLR heaviness R > L 10 supine bridges Seated marching with 1.5 lbs ankle weight bilaterally, hold for 2 s at the top, 10 reps x 2 sets Sit to stand at a non-handle chair with 17 inches height, SBA, 10 reps x 1 set   Neuromuscular re-education:  Berg balance test: 50/56 Obstacle course within //-bars (step on and off two hard blocks, step on and off a stair, step over two yoga block, maintain balance on airex for 10 s, standing marching on airex for 10 reps each side) x 10 laps, CGA     PT Education - 05/18/19 0935    Education Details  Discussed following POC and visits with the patient.    Person(s) Educated  Patient    Methods  Explanation;Demonstration    Comprehension  Verbalized understanding;Returned demonstration          PT Long Term Goals - 05/18/19 1007      PT LONG TERM GOAL #1   Title  Pt. will  increase FOTO to 64 to improve safety with functional tasks/ mobility.    Baseline  Initial FOTO: 60; Current FOTO: 44  Time  4    Period  Weeks    Status  Not Met    Target Date  06/15/19      PT LONG TERM GOAL #2   Title  Pt. independent with HEP to increase B LE muscle strength to grossly 4+/5 MMT to improve sit to stands/ safety with walking.    Baseline  Current: bilaterally hip felxion (4-/5), quad (5/5); hamstring, hip abd, hip add (4+/5)    Time  4    Period  Weeks    Status  Partially Met    Target Date  06/15/19      PT LONG TERM GOAL #3   Title  Pt. able to stand from 17" height chair without UE assist safely with no LOB to improve safety at home.    Baseline  Extra time/ B UE assist required.    Time  4    Period  Weeks    Status  Achieved    Target Date  05/18/19      PT LONG TERM GOAL #4   Title  Pt. will increase Berg balance test to >50/56 to improve safety/ decrease fall risk.    Baseline  Berg on evaluation: 47/56.  1/21: 50/56    Time  4    Period  Weeks    Status  Achieved    Target Date  05/18/19      PT LONG TERM GOAL #5   Title  Pt. will ambulate 1 mile with normalized gait pattern and mod. independence safely to promote return to traveling.    Baseline  Gait limited by poor endurance/ balance issues.  Pt. fatigues easily and requires rest breaks.  Pt. enjoys traveling.    Time  4    Period  Weeks    Status  Partially Met    Target Date  06/15/19      Additional Long Term Goals   Additional Long Term Goals  Yes      PT LONG TERM GOAL #6   Title  Pt. will increase Berg balance test to >52/56 to improve safety/ decrease fall risk.    Baseline  Current: 50/56    Time  4    Period  Weeks    Status  On-going    Target Date  06/15/19      PT LONG TERM GOAL #7   Title  Pt. will be able to perform single leg squat with TRX at a chair of 18.5 inches height to increase unilateral LE strength to improve her safety when get on and off curbs and stairs     Baseline  Pt. is able to perform sit to stand without use of her hands at a chair of 17 inches height    Time  4    Period  Weeks    Status  New    Target Date  06/15/19            Plan - 05/18/19 0946    Clinical Impression Statement  Pt. demonstrates improved balance by improving Berg balance test by 3 points (previous 47/56, today: 50/56). Pt. demonstrates improved bilateral LE strength by performing sit to stand at a non-handle chair with 17 inches height without use of her hands for 10 reps with proper technique compared to before when she had to use hands for UE assist to stand up from the same height. Pt. demonstrates improved bilateral hamstring flexibilty during supine PROM hamstring measurement (previous: 75 deg bilaterally; today:  R 87 deg, L 80 deg). The patient scores lower in FOTO (44) compared to previous (60). The patient demonstrates increased quad (5/5), hip abd (4+/5) and hip add (4+/5) strength bilaterally without increased pain by improving 1/2 MMT grade on all these tests. The patient has less heaviness compared to previous tx. during supine SLR, heaviness R > L. Pt. will benefit from skilled PT services to improve LE strength and endurance to improve independence in IADLs and decrease fall risks.    Examination-Activity Limitations  Stand;Stairs    Examination-Participation Restrictions  Shop    Stability/Clinical Decision Making  Evolving/Moderate complexity    Clinical Decision Making  Moderate    Rehab Potential  Good    PT Frequency  2x / week    PT Duration  4 weeks    PT Treatment/Interventions  ADLs/Self Care Home Management;Moist Heat;Gait training;Stair training;Functional mobility training;Neuromuscular re-education;Balance training;Therapeutic exercise;Therapeutic activities;Patient/family education;Manual techniques;Passive range of motion;Energy conservation;Electrical Stimulation;Cryotherapy    PT Next Visit Plan  Increase standing activities and outdoor  walking to improve LE endurance. Increase hip flexion and hip abd strength to decrease fall risks and increase single leg balance.    PT Home Exercise Plan  Access Code: [email or web address]    Consulted and Agree with Plan of Care  Patient       Patient will benefit from skilled therapeutic intervention in order to improve the following deficits and impairments:  Abnormal gait, Improper body mechanics, Pain, Postural dysfunction, Decreased coordination, Decreased activity tolerance, Decreased endurance, Decreased range of motion, Decreased strength, Impaired flexibility, Difficulty walking, Decreased balance  Visit Diagnosis: Muscle weakness (generalized)  Balance problems  Pain in left leg  Pain in right leg  Gait difficulty     Problem List Patient Active Problem List   Diagnosis Date Noted  . Other spondylosis, sacral and sacrococcygeal region 04/21/2019  . Abnormal MRI, cervical spine (02/08/2019) 03/25/2019  . Lower extremity weakness (Bilateral) 03/25/2019  . Coordination impairment (lower extremity) 03/25/2019  . Hypomagnesemia 03/24/2019  . Spondylosis without myelopathy or radiculopathy, lumbosacral region 03/24/2019  . Lumbar Facet Hypertrophy 03/24/2019  . Grade 1 Anterolisthesis of L4/5 03/11/2019  . Chronic hip pain (Bilateral) (R>L) 03/11/2019  . Chronic sacroiliac joint pain (Bilateral) 03/11/2019  . Sacroiliac joint somatic dysfunction (Bilateral) 03/11/2019  . Chronic feet pain (Primary Area of Pain) (Bilateral) 03/11/2019  . Chronic leg and foot pain (Bilateral) 03/11/2019  . Diabetic peripheral neuropathy ([place]) 03/11/2019  . Chronic neuropathic pain 03/11/2019  . Neurogenic pain 03/11/2019  . Chronic musculoskeletal pain 03/11/2019  . Osteoarthritis involving multiple joints 03/11/2019  . Chronic pain syndrome 03/10/2019  . Pharmacologic therapy 03/10/2019  . Disorder of skeletal system 03/10/2019  . Problems influencing health status 03/10/2019  . Chronic low  back pain (Third area of Pain) (Bilateral) w/o sciatica 03/10/2019  . Chronic lower extremity pain (Secondary area of Pain) (Bilateral) 03/10/2019  . Abnormal MRI, lumbar spine (02/08/2019) 03/10/2019  . Essential hypertension 10/06/2018  . Cholelithiasis 06/23/2018  . Fatty liver 06/23/2018  . Helicobacter pylori infection 05/13/2017  . Mastalgia in female 04/09/2017  . Bilateral leg edema 07/02/2016  . DDD (degenerative disc disease), lumbar 12/16/2014  . Lumbar facet syndrome (Bilateral) 12/16/2014  . Sacroiliac joint dysfunction 12/16/2014  . Neuropathy due to secondary diabetes ([place]) 12/16/2014  . Vitamin D deficiency 12/13/2014  . Morbid obesity ([place]) 12/10/2014  . Type 2 diabetes mellitus with diabetic neuropathy, unspecified ([place]) 12/10/2014  . Hyperlipidemia 12/10/2014  . Gastroesophageal reflux disease without esophagitis 12/10/2014  Pura Spice, PT, DPT # [place], SPT 05/19/2019, 9:38 AM  [facility] [address] [place], [place], [address] Phone: [phone]   Fax:  [phone]  Name: Lisa Galvan MRN: [id number] Date of Birth: 10-Jan-1961

## 2019-05-19 ENCOUNTER — Ambulatory Visit: Payer: BLUE CROSS/BLUE SHIELD | Admitting: Gastroenterology

## 2019-05-19 ENCOUNTER — Encounter: Payer: Self-pay | Admitting: Physical Therapy

## 2019-05-19 LAB — BASIC METABOLIC PANEL
BUN/Creatinine Ratio: 11 — ABNORMAL LOW (ref 12–28)
BUN: 10 mg/dL (ref 8–27)
CO2: 26 mmol/L (ref 20–29)
Calcium: 10 mg/dL (ref 8.7–10.3)
Chloride: 101 mmol/L (ref 96–106)
Creatinine, Ser: 0.88 mg/dL (ref 0.57–1.00)
GFR calc Af Amer: 83 mL/min/{1.73_m2} (ref >59)
GFR calc non Af Amer: 72 mL/min/{1.73_m2} (ref >59)
Glucose: 100 mg/dL — ABNORMAL HIGH (ref 65–99)
Potassium: 4 mmol/L (ref 3.5–5.2)
Sodium: 144 mmol/L (ref 134–144)

## 2019-05-20 ENCOUNTER — Ambulatory Visit: Payer: BLUE CROSS/BLUE SHIELD | Admitting: Physical Therapy

## 2019-05-20 ENCOUNTER — Other Ambulatory Visit: Payer: Self-pay

## 2019-05-20 DIAGNOSIS — R269 Unspecified abnormalities of gait and mobility: Secondary | ICD-10-CM | POA: Diagnosis not present

## 2019-05-20 DIAGNOSIS — R2689 Other abnormalities of gait and mobility: Secondary | ICD-10-CM | POA: Diagnosis not present

## 2019-05-20 DIAGNOSIS — M79605 Pain in left leg: Secondary | ICD-10-CM | POA: Diagnosis not present

## 2019-05-20 DIAGNOSIS — M79604 Pain in right leg: Secondary | ICD-10-CM | POA: Diagnosis not present

## 2019-05-20 DIAGNOSIS — M6281 Muscle weakness (generalized): Secondary | ICD-10-CM | POA: Diagnosis not present

## 2019-05-20 NOTE — Therapy (Signed)
[facility] [address] [place], [place], [address] Phone: [phone]   Fax:  [phone]  Physical Therapy Treatment  Patient Details  Name: Lisa Galvan MRN: [id number] Date of Birth: 02/23/1961 Referring Provider (PT): Dr. Naveira   Encounter Date: 05/20/2019  PT End of Session - 05/20/19 1006    Visit Number  10    Number of Visits  17    Date for PT Re-Evaluation  06/15/19    Authorization - Visit Number  2    Authorization - Number of Visits  10    PT Start Time  0811    PT Stop Time  0901    PT Time Calculation (min)  50 min    Equipment Utilized During Treatment  Gait belt    Activity Tolerance  Patient tolerated treatment well    Behavior During Therapy  WFL for tasks assessed/performed       Past Medical History:  Diagnosis Date  . Allergy   . Arthritis    spine  . Bilateral leg edema   . Chest pain 12/15/2014  . Chronic sciatica 12/10/2014  . Chronic sciatica 12/10/2014  . Diabetes mellitus without complication (HCC)   . Essential hypertension 12/10/2014  . Essential hypertension 12/10/2014  . Gastroesophageal reflux disease without esophagitis   . GERD (gastroesophageal reflux disease)   . Hyperlipidemia   . Hypertension   . Mixed hyperlipidemia   . Obesity (BMI 30.0-34.9) 12/10/2014  . Type 2 diabetes mellitus without complication (HCC) 12/10/2014    Past Surgical History:  Procedure Laterality Date  . ABDOMINAL HYSTERECTOMY    . COLONOSCOPY WITH PROPOFOL N/A 03/30/2019   Procedure: COLONOSCOPY WITH PROPOFOL;  Surgeon: Vanga, Rohini Reddy, MD;  Location: [facility];  Service: Gastroenterology;  Laterality: N/A;  . ESOPHAGOGASTRODUODENOSCOPY (EGD) WITH PROPOFOL N/A 03/12/2017   Procedure: ESOPHAGOGASTRODUODENOSCOPY (EGD) WITH PROPOFOL;  Surgeon: Vanga, Rohini Reddy, MD;  Location: [facility];  Service: Gastroenterology;  Laterality: N/A;  . ESOPHAGOGASTRODUODENOSCOPY (EGD) WITH PROPOFOL N/A  03/30/2019   Procedure: ESOPHAGOGASTRODUODENOSCOPY (EGD) WITH PROPOFOL;  Surgeon: Vanga, Rohini Reddy, MD;  Location: [facility];  Service: Gastroenterology;  Laterality: N/A;  . HERNIA REPAIR    . UMBILICAL HERNIA REPAIR N/A 05/01/2017   Procedure: HERNIA REPAIR UMBILICAL ADULT;  Surgeon: Davis, Jason Evan, MD;  Location: [facility];  Service: General;  Laterality: N/A;    There were no vitals filed for this visit.  Subjective Assessment - 05/20/19 0955    Subjective  Pt. reports decreased numbness in legs bilaterally to 2/10 prior to today's treatment session and LE stretching she does at home is helping her to reduce the B legs numbness. Pt. reports she was off balance 4 times since the past Monday when she suddenly stopped during walking but was able to self correct. Pt states she experieced the off balance previously but the frequency has been decreasing since start of PT. No recent falls reported.    Pertinent History  B LE neuropathy starting about 2 years ago.  H/o DM type 2.  Pt. retired from working with mentally ill.  Pt. enjoys traveling but limited by Covid.  Pt. watches TV at this time.  Pt. was going to [employer] 3x/week prior to Covid.  Lyrica has been helping (not taking Gabapentin at this time).  Pt. has h/o numerous falls over past year.  See MRI reports.  Pt. has received 1 lumbar nerve block and receives 2nd tomorrow from Dr. Naveira.  Pt. reports   she is limited with shopping due to prolonged standing/walking.  Pt. lives by herself in 1 story house (has stairs to attic).    Limitations  Lifting;Standing;Walking;House hold activities    How long can you stand comfortably?  <1 hour (prefers to lean against counter)    How long can you walk comfortably?  <1 mile    Diagnostic tests  See MRI reports (recent)    Patient Stated Goals  Improve balance/ coordination of feet and legs.  Increase LE muscle strengthening.  Decrease fall risk.    Currently in Pain?  Yes    Pain Score  2      Pain Location  Leg    Pain Orientation  Right;Left    Pain Descriptors / Indicators  Numbness    Pain Type  Chronic pain;Neuropathic pain    Pain Onset  More than a month ago    Pain Frequency  Intermittent    Pain Relieving Factors  stretching    Pain Onset  More than a month ago      Therex: Nuestep L 5 10 min RTB side steps 3 laps with SBA without UE assist with vebral cueing for posture correction RTB Monster forward walk 3 laps with SBA without UE assist with verbal cueing for posture correction Lateral step up and down  8 reps x 1 set each side with one hand for UE assist, 5 reps x 1 set each side with two fingers for UE assist with SBA and verbal cueing for foot placement. TRX sit to stand with staggered stance at gray chair 10 reps x 1 set at each side with verbal cueing for keeping cervical spine in neutral position and for foot placement correction, with SBA  Neuromuscular Re-education (Standing marching on airex with 3 s hold at the top 5 reps each side, step over two vertically placed yoga blocks within //-bars, SBA/CGA with mirror feedback and verbal cueing for posture correction) x 3 laps Outdoor walking on grassy surfaces and hard surfaces x 4 min with SBA/CGA with verbal cueing for increasing heel strike and hip flexion during gait.    PT Long Term Goals - 05/18/19 1007      PT LONG TERM GOAL #1   Title  Pt. will increase FOTO to 64 to improve safety with functional tasks/ mobility.    Baseline  Initial FOTO: 60; Current FOTO: 44    Time  4    Period  Weeks    Status  Not Met    Target Date  06/15/19      PT LONG TERM GOAL #2   Title  Pt. independent with HEP to increase B LE muscle strength to grossly 4+/5 MMT to improve sit to stands/ safety with walking.    Baseline  Current: bilaterally hip felxion (4-/5), quad (5/5); hamstring, hip abd, hip add (4+/5)    Time  4    Period  Weeks    Status  Partially Met    Target Date  06/15/19      PT LONG TERM  GOAL #3   Title  Pt. able to stand from 17" height chair without UE assist safely with no LOB to improve safety at home.    Baseline  Extra time/ B UE assist required.    Time  4    Period  Weeks    Status  Achieved    Target Date  05/18/19      PT LONG TERM GOAL #4   Title    Pt. will increase Berg balance test to >50/56 to improve safety/ decrease fall risk.    Baseline  Berg on evaluation: 47/56.  1/21: 50/56    Time  4    Period  Weeks    Status  Achieved    Target Date  05/18/19      PT LONG TERM GOAL #5   Title  Pt. will ambulate 1 mile with normalized gait pattern and mod. independence safely to promote return to traveling.    Baseline  Gait limited by poor endurance/ balance issues.  Pt. fatigues easily and requires rest breaks.  Pt. enjoys traveling.    Time  4    Period  Weeks    Status  Partially Met    Target Date  06/15/19      Additional Long Term Goals   Additional Long Term Goals  Yes      PT LONG TERM GOAL #6   Title  Pt. will increase Berg balance test to >52/56 to improve safety/ decrease fall risk.    Baseline  Current: 50/56    Time  4    Period  Weeks    Status  On-going    Target Date  06/15/19      PT LONG TERM GOAL #7   Title  Pt. will be able to perform single leg squat with TRX at a chair of 18.5 inches height to increase unilateral LE strength to improve her safety when get on and off curbs and stairs    Baseline  Pt. is able to perform sit to stand without use of her hands at a chair of 17 inches height    Time  4    Period  Weeks    Status  New    Target Date  06/15/19            Plan - 05/20/19 1007    Clinical Impression Statement  Pt. demonstrates increase LE endurance by requiring minimal rest breaks during all standing exercises and training. Pt. requires two fingers for holding the bar to maintain her balaance during standing marching on airex with 3 lbs B ankle weights within //-bars with SBA/CGA. Pt. requires frequent verbal  cueing for posture correction during balance training and therex. Pt. demonstrates good techniques with RTB monster walk and side step without UE assist to maintain balance. Pt. demonstrates spinal lateral bending as a compensating pattern during lateral step up R and L side when only uses two fingers for UE assist. Pt. has muscle soreness in B quad during marching with ankle weights and lateral step up but is able to continue training without rest break. Pt will benefit from skilled PT to continue strengthening LE and improve balance to improve IADL independence and decrease fall risks.    Examination-Activity Limitations  Stand;Stairs    Examination-Participation Restrictions  Shop    Stability/Clinical Decision Making  Evolving/Moderate complexity    Clinical Decision Making  Moderate    Rehab Potential  Good    PT Frequency  2x / week    PT Duration  4 weeks    PT Treatment/Interventions  ADLs/Self Care Home Management;Moist Heat;Gait training;Stair training;Functional mobility training;Neuromuscular re-education;Balance training;Therapeutic exercise;Therapeutic activities;Patient/family education;Manual techniques;Passive range of motion;Energy conservation;Electrical Stimulation;Cryotherapy    PT Next Visit Plan  Increase standing activities and outdoor walking to improve LE endurance. Increase hip flexion and hip abd strength to decrease fall risks and increase single leg balance.    PT Home Exercise Plan  Access  Code: [email or web address]    Consulted and Agree with Plan of Care  Patient       Patient will benefit from skilled therapeutic intervention in order to improve the following deficits and impairments:  Abnormal gait, Improper body mechanics, Pain, Postural dysfunction, Decreased coordination, Decreased activity tolerance, Decreased endurance, Decreased range of motion, Decreased strength, Impaired flexibility, Difficulty walking, Decreased balance  Visit Diagnosis: Muscle weakness  (generalized)  Balance problems  Pain in left leg  Pain in right leg  Gait difficulty     Problem List Patient Active Problem List   Diagnosis Date Noted  . Other spondylosis, sacral and sacrococcygeal region 04/21/2019  . Abnormal MRI, cervical spine (02/08/2019) 03/25/2019  . Lower extremity weakness (Bilateral) 03/25/2019  . Coordination impairment (lower extremity) 03/25/2019  . Hypomagnesemia 03/24/2019  . Spondylosis without myelopathy or radiculopathy, lumbosacral region 03/24/2019  . Lumbar Facet Hypertrophy 03/24/2019  . Grade 1 Anterolisthesis of L4/5 03/11/2019  . Chronic hip pain (Bilateral) (R>L) 03/11/2019  . Chronic sacroiliac joint pain (Bilateral) 03/11/2019  . Sacroiliac joint somatic dysfunction (Bilateral) 03/11/2019  . Chronic feet pain (Primary Area of Pain) (Bilateral) 03/11/2019  . Chronic leg and foot pain (Bilateral) 03/11/2019  . Diabetic peripheral neuropathy (HCC) 03/11/2019  . Chronic neuropathic pain 03/11/2019  . Neurogenic pain 03/11/2019  . Chronic musculoskeletal pain 03/11/2019  . Osteoarthritis involving multiple joints 03/11/2019  . Chronic pain syndrome 03/10/2019  . Pharmacologic therapy 03/10/2019  . Disorder of skeletal system 03/10/2019  . Problems influencing health status 03/10/2019  . Chronic low back pain (Third area of Pain) (Bilateral) w/o sciatica 03/10/2019  . Chronic lower extremity pain (Secondary area of Pain) (Bilateral) 03/10/2019  . Abnormal MRI, lumbar spine (02/08/2019) 03/10/2019  . Essential hypertension 10/06/2018  . Cholelithiasis 06/23/2018  . Fatty liver 06/23/2018  . Helicobacter pylori infection 05/13/2017  . Mastalgia in female 04/09/2017  . Bilateral leg edema 07/02/2016  . DDD (degenerative disc disease), lumbar 12/16/2014  . Lumbar facet syndrome (Bilateral) 12/16/2014  . Sacroiliac joint dysfunction 12/16/2014  . Neuropathy due to secondary diabetes (HCC) 12/16/2014  . Vitamin D deficiency  12/13/2014  . Morbid obesity (HCC) 12/10/2014  . Type 2 diabetes mellitus with diabetic neuropathy, unspecified (HCC) 12/10/2014  . Hyperlipidemia 12/10/2014  . Gastroesophageal reflux disease without esophagitis 12/10/2014   Michael C Sherk, PT, DPT # [id number] Yuan Zhuang, SPT 05/21/2019, 8:25 AM  [facility] [address] [place], [place], [address] Phone: [phone]   Fax:  [phone]  Name: Lisa Galvan MRN: [id number] Date of Birth: 09/20/1960   

## 2019-05-21 ENCOUNTER — Encounter: Payer: Self-pay | Admitting: Physical Therapy

## 2019-05-22 ENCOUNTER — Telehealth: Payer: Self-pay

## 2019-05-22 NOTE — Telephone Encounter (Signed)
Patient notified that she can call and schedule mammo at [facility]. FYI: Patient is going to get her COVID Vaccine Copied from CRM [id number]. Topic: General - Inquiry >> May 21, 2019  8:43 AM Deborha Payment wrote: Reason for CRM: Patient is requesting her mammogram order to go to [facility].  Patient does not want to go to [place].  Call back [phone] >> May 22, 2019  9:34 AM Adela Ports M wrote: Please send order to [place] instead of [place] [facility].

## 2019-05-25 ENCOUNTER — Other Ambulatory Visit: Payer: Self-pay

## 2019-05-25 ENCOUNTER — Other Ambulatory Visit: Payer: Self-pay | Admitting: Family Medicine

## 2019-05-25 ENCOUNTER — Encounter: Payer: Self-pay | Admitting: Physical Therapy

## 2019-05-25 ENCOUNTER — Ambulatory Visit: Payer: BLUE CROSS/BLUE SHIELD | Attending: Pain Medicine | Admitting: Physical Therapy

## 2019-05-25 ENCOUNTER — Telehealth: Payer: BLUE CROSS/BLUE SHIELD | Admitting: Pain Medicine

## 2019-05-25 DIAGNOSIS — M6281 Muscle weakness (generalized): Secondary | ICD-10-CM | POA: Insufficient documentation

## 2019-05-25 DIAGNOSIS — R269 Unspecified abnormalities of gait and mobility: Secondary | ICD-10-CM | POA: Diagnosis not present

## 2019-05-25 DIAGNOSIS — M79604 Pain in right leg: Secondary | ICD-10-CM | POA: Diagnosis not present

## 2019-05-25 DIAGNOSIS — R2689 Other abnormalities of gait and mobility: Secondary | ICD-10-CM | POA: Diagnosis not present

## 2019-05-25 DIAGNOSIS — M79605 Pain in left leg: Secondary | ICD-10-CM | POA: Diagnosis not present

## 2019-05-25 DIAGNOSIS — Z1231 Encounter for screening mammogram for malignant neoplasm of breast: Secondary | ICD-10-CM

## 2019-05-25 NOTE — Therapy (Signed)
[facility] [address] [place], [place], [address] Phone: [phone]   Fax:  [phone]  Physical Therapy Treatment  Patient Details  Name: Lisa Galvan MRN: [id number] Date of Birth: 15-Jan-1961 Referring Provider (PT): Dr. Dossie Arbour   Encounter Date: 05/25/2019  PT End of Session - 05/25/19 0924    Visit Number  11    Number of Visits  17    Date for PT Re-Evaluation  06/15/19    Authorization - Visit Number  3    Authorization - Number of Visits  10    PT Start Time  0809    PT Stop Time  0906    PT Time Calculation (min)  57 min    Equipment Utilized During Treatment  Gait belt    Activity Tolerance  Patient tolerated treatment well    Behavior During Therapy  [facility] for tasks assessed/performed       Past Medical History:  Diagnosis Date  . Allergy   . Arthritis    spine  . Bilateral leg edema   . Chest pain 12/15/2014  . Chronic sciatica 12/10/2014  . Chronic sciatica 12/10/2014  . Diabetes mellitus without complication ([place])   . Essential hypertension 12/10/2014  . Essential hypertension 12/10/2014  . Gastroesophageal reflux disease without esophagitis   . GERD (gastroesophageal reflux disease)   . Hyperlipidemia   . Hypertension   . Mixed hyperlipidemia   . Obesity (BMI 30.0-34.9) 12/10/2014  . Type 2 diabetes mellitus without complication ([place]) 1/70/0174    Past Surgical History:  Procedure Laterality Date  . ABDOMINAL HYSTERECTOMY    . COLONOSCOPY WITH PROPOFOL N/A 03/30/2019   Procedure: COLONOSCOPY WITH PROPOFOL;  Surgeon: Lin Landsman, MD;  Location: [facility] ENDOSCOPY;  Service: Gastroenterology;  Laterality: N/A;  . ESOPHAGOGASTRODUODENOSCOPY (EGD) WITH PROPOFOL N/A 03/12/2017   Procedure: ESOPHAGOGASTRODUODENOSCOPY (EGD) WITH PROPOFOL;  Surgeon: Lin Landsman, MD;  Location: [facility] ENDOSCOPY;  Service: Gastroenterology;  Laterality: N/A;  . ESOPHAGOGASTRODUODENOSCOPY (EGD) WITH PROPOFOL N/A 03/30/2019    Procedure: ESOPHAGOGASTRODUODENOSCOPY (EGD) WITH PROPOFOL;  Surgeon: Lin Landsman, MD;  Location: [facility] ENDOSCOPY;  Service: Gastroenterology;  Laterality: N/A;  . HERNIA REPAIR    . UMBILICAL HERNIA REPAIR N/A 05/01/2017   Procedure: HERNIA REPAIR UMBILICAL ADULT;  Surgeon: Vickie Epley, MD;  Location: [facility] ORS;  Service: General;  Laterality: N/A;    There were no vitals filed for this visit.  Subjective Assessment - 05/25/19 0817    Subjective  Pt. reports she has no reaction to the first shot of COVID-19 vaccination she received on Friday (05/22/2019). Pt. states she helps her friend to clean up at her friend's home for a couple of hours this past weekend and her B hamstring (3/10 soreness) and low back (2/10 soreness) has increased muscle pain from it. Pt. reports 3/10 numbness in B calf prior to today's session. Pt. reports she feels better each time she finishes the PT session. Pt. reports she feels her legs are stronger and it is easier for her to get up and down curbs.    Pertinent History  B LE neuropathy starting about 2 years ago.  H/o DM type 2.  Pt. retired from working with mentally ill.  Pt. enjoys traveling but limited by Covid.  Pt. watches TV at this time.  Pt. was going to [place] prior to Covid.  Lyrica has been helping (not taking Gabapentin at this time).  Pt. has h/o numerous falls over past  year.  See MRI reports.  Pt. has received 1 lumbar nerve block and receives 2nd tomorrow from Dr. Dossie Arbour.  Pt. reports she is limited with shopping due to prolonged standing/walking.  Pt. lives by herself in 1 story house (has stairs to attic).    Limitations  Lifting;Standing;Walking;House hold activities    How long can you stand comfortably?  <1 hour (prefers to lean against counter)    How long can you walk comfortably?  <1 mile    Diagnostic tests  See MRI reports (recent)    Patient Stated Goals  Improve balance/ coordination of feet and legs.  Increase LE  muscle strengthening.  Decrease fall risk.    Currently in Pain?  Yes    Pain Score  3     Pain Location  Leg    Pain Orientation  Left;Right    Pain Descriptors / Indicators  Sore    Pain Type  Acute pain    Pain Onset  In the past 7 days    Pain Frequency  Intermittent    Pain Relieving Factors  stretching    Multiple Pain Sites  Yes    Pain Score  2    Pain Location  Back    Pain Orientation  Lower    Pain Descriptors / Indicators  Sore    Pain Type  Acute pain    Pain Onset  In the past 7 days    Pain Frequency  Intermittent    Pain Score  3    Pain Location  Leg    Pain Orientation  Left;Right    Pain Descriptors / Indicators  Numbness;Tightness    Pain Type  Chronic pain    Pain Onset  More than a month ago    Pain Frequency  Intermittent         Manual Therapy: Supine Hamstring/gluts/piriformis manual stretches 1 min x each side Supine sciatic nerve glides 1 min each side  Therex: (<5 min.) Nustep L 5 10 min (no charge).  Supine marching with slight manual resistance/Supine SLR with TrA activation 10 reps x 1 set on each side, verbal cueing for TrA activation and breathing  Neuromuscular re-education: Side walk 40 lbs at [place], 3 laps each side, CGA, verbal cueing for posture correction. Single leg and foot stepping in multi-direction 5 reps x 1 set on each leg, 3 reps x 1 set on each leg, CGA, verbal cueing for foot placement and posture correction. Tandem stance 20 s x 2 sets on each leg, CGA Tandem walking 4 laps within //- bars, CGA, verbal cueing for posture correction      PT Long Term Goals - 05/18/19 1007      PT LONG TERM GOAL #1   Title  Pt. will increase FOTO to 64 to improve safety with functional tasks/ mobility.    Baseline  Initial FOTO: 60; Current FOTO: 44    Time  4    Period  Weeks    Status  Not Met    Target Date  06/15/19      PT LONG TERM GOAL #2   Title  Pt. independent with HEP to increase B LE muscle strength to grossly  4+/5 MMT to improve sit to stands/ safety with walking.    Baseline  Current: bilaterally hip felxion (4-/5), quad (5/5); hamstring, hip abd, hip add (4+/5)    Time  4    Period  Weeks    Status  Partially Met  Target Date  06/15/19      PT LONG TERM GOAL #3   Title  Pt. able to stand from 17" height chair without UE assist safely with no LOB to improve safety at home.    Baseline  Extra time/ B UE assist required.    Time  4    Period  Weeks    Status  Achieved    Target Date  05/18/19      PT LONG TERM GOAL #4   Title  Pt. will increase Berg balance test to >50/56 to improve safety/ decrease fall risk.    Baseline  Berg on evaluation: 47/56.  1/21: 50/56    Time  4    Period  Weeks    Status  Achieved    Target Date  05/18/19      PT LONG TERM GOAL #5   Title  Pt. will ambulate 1 mile with normalized gait pattern and mod. independence safely to promote return to traveling.    Baseline  Gait limited by poor endurance/ balance issues.  Pt. fatigues easily and requires rest breaks.  Pt. enjoys traveling.    Time  4    Period  Weeks    Status  Partially Met    Target Date  06/15/19      Additional Long Term Goals   Additional Long Term Goals  Yes      PT LONG TERM GOAL #6   Title  Pt. will increase Berg balance test to >52/56 to improve safety/ decrease fall risk.    Baseline  Current: 50/56    Time  4    Period  Weeks    Status  On-going    Target Date  06/15/19      PT LONG TERM GOAL #7   Title  Pt. will be able to perform single leg squat with TRX at a chair of 18.5 inches height to increase unilateral LE strength to improve her safety when get on and off curbs and stairs    Baseline  Pt. is able to perform sit to stand without use of her hands at a chair of 17 inches height    Time  4    Period  Weeks    Status  New    Target Date  06/15/19            Plan - 05/25/19 1328    Clinical Impression Statement  Pt. demonstrates increased tightness in B  hamstring but the ROM improves during supine manual SLR stretching. Pt. demonstrates improved static balance in narrowed BOS during tandem stance, pt is able to perform it by using ankle and hip startegy and without UE assist to maintain balance for 20 s on each side. Pt. demonstrates improved dynamic balance when performing tandem walk and side steps with 40 lbs at [place]ilus without UE assist and withinout LOB with SBA/CGA. Pt. demonstates good ability in weightshifting during single leg foot stepping exercise in multi-direction, pt is able to perform it without LOB and is able to self-correct without UE assist with SBA/CGA. Pt. will benefit from skilled PT service to increase LE strength and endurance and dynamic balance to decrease fall risk and increase gait independence.    Examination-Activity Limitations  Stand;Stairs    Examination-Participation Restrictions  Shop    Stability/Clinical Decision Making  Evolving/Moderate complexity    Clinical Decision Making  Moderate    Rehab Potential  Good    PT Frequency  2x / week  PT Duration  4 weeks    PT Treatment/Interventions  ADLs/Self Care Home Management;Moist Heat;Gait training;Stair training;Functional mobility training;Neuromuscular re-education;Balance training;Therapeutic exercise;Therapeutic activities;Patient/family education;Manual techniques;Passive range of motion;Energy conservation;Electrical Stimulation;Cryotherapy    PT Next Visit Plan  Increase standing activities and outdoor walking to improve LE endurance. Progress single leg balance, tandem walk on foam, obstacle course on boso.    PT Home Exercise Plan  Access Code: [email or web address]    Consulted and Agree with Plan of Care  Patient       Patient will benefit from skilled therapeutic intervention in order to improve the following deficits and impairments:  Abnormal gait, Improper body mechanics, Pain, Postural dysfunction, Decreased coordination, Decreased activity tolerance, Decreased  endurance, Decreased range of motion, Decreased strength, Impaired flexibility, Difficulty walking, Decreased balance  Visit Diagnosis: Muscle weakness (generalized)  Balance problems  Pain in left leg  Pain in right leg  Gait difficulty     Problem List Patient Active Problem List   Diagnosis Date Noted  . Other spondylosis, sacral and sacrococcygeal region 04/21/2019  . Abnormal MRI, cervical spine (02/08/2019) 03/25/2019  . Lower extremity weakness (Bilateral) 03/25/2019  . Coordination impairment (lower extremity) 03/25/2019  . Hypomagnesemia 03/24/2019  . Spondylosis without myelopathy or radiculopathy, lumbosacral region 03/24/2019  . Lumbar Facet Hypertrophy 03/24/2019  . Grade 1 Anterolisthesis of L4/5 03/11/2019  . Chronic hip pain (Bilateral) (R>L) 03/11/2019  . Chronic sacroiliac joint pain (Bilateral) 03/11/2019  . Sacroiliac joint somatic dysfunction (Bilateral) 03/11/2019  . Chronic feet pain (Primary Area of Pain) (Bilateral) 03/11/2019  . Chronic leg and foot pain (Bilateral) 03/11/2019  . Diabetic peripheral neuropathy ([place]) 03/11/2019  . Chronic neuropathic pain 03/11/2019  . Neurogenic pain 03/11/2019  . Chronic musculoskeletal pain 03/11/2019  . Osteoarthritis involving multiple joints 03/11/2019  . Chronic pain syndrome 03/10/2019  . Pharmacologic therapy 03/10/2019  . Disorder of skeletal system 03/10/2019  . Problems influencing health status 03/10/2019  . Chronic low back pain (Third area of Pain) (Bilateral) w/o sciatica 03/10/2019  . Chronic lower extremity pain (Secondary area of Pain) (Bilateral) 03/10/2019  . Abnormal MRI, lumbar spine (02/08/2019) 03/10/2019  . Essential hypertension 10/06/2018  . Cholelithiasis 06/23/2018  . Fatty liver 06/23/2018  . Helicobacter pylori infection 05/13/2017  . Mastalgia in female 04/09/2017  . Bilateral leg edema 07/02/2016  . DDD (degenerative disc disease), lumbar 12/16/2014  . Lumbar facet syndrome  (Bilateral) 12/16/2014  . Sacroiliac joint dysfunction 12/16/2014  . Neuropathy due to secondary diabetes ([place]) 12/16/2014  . Vitamin D deficiency 12/13/2014  . Morbid obesity ([place]) 12/10/2014  . Type 2 diabetes mellitus with diabetic neuropathy, unspecified ([place]) 12/10/2014  . Hyperlipidemia 12/10/2014  . Gastroesophageal reflux disease without esophagitis 12/10/2014   Pura Spice, PT, DPT # [id number] Mittie Bodo, SPT 05/26/2019, 12:45 PM   [facility] [address] [place], [place], [address] Phone: [phone]   Fax:  [phone]  Name: Lisa Galvan MRN: [id number] Date of Birth: 05-Jun-1960

## 2019-05-27 ENCOUNTER — Other Ambulatory Visit: Payer: Self-pay

## 2019-05-27 ENCOUNTER — Ambulatory Visit: Payer: BLUE CROSS/BLUE SHIELD | Admitting: Physical Therapy

## 2019-05-27 ENCOUNTER — Encounter: Payer: Self-pay | Admitting: Physical Therapy

## 2019-05-27 DIAGNOSIS — R269 Unspecified abnormalities of gait and mobility: Secondary | ICD-10-CM | POA: Diagnosis not present

## 2019-05-27 DIAGNOSIS — M6281 Muscle weakness (generalized): Secondary | ICD-10-CM

## 2019-05-27 DIAGNOSIS — M79604 Pain in right leg: Secondary | ICD-10-CM | POA: Diagnosis not present

## 2019-05-27 DIAGNOSIS — M79605 Pain in left leg: Secondary | ICD-10-CM | POA: Diagnosis not present

## 2019-05-27 DIAGNOSIS — R2689 Other abnormalities of gait and mobility: Secondary | ICD-10-CM

## 2019-05-27 NOTE — Therapy (Signed)
[facility] [address] [place], [place], [address] Phone: [phone]   Fax:  [phone]  Physical Therapy Treatment  Patient Details  Name: Lisa Galvan MRN: [id number] Date of Birth: August 10, 1960 Referring Provider (PT): Dr. Dossie Arbour   Encounter Date: 05/27/2019  PT End of Session - 05/27/19 1039    Visit Number  12    Number of Visits  17    Date for PT Re-Evaluation  06/15/19    Authorization - Visit Number  4    Authorization - Number of Visits  10    PT Start Time  0806    PT Stop Time  0857    PT Time Calculation (min)  51 min    Equipment Utilized During Treatment  Gait belt    Activity Tolerance  Patient tolerated treatment well    Behavior During Therapy  [facility] for tasks assessed/performed       Past Medical History:  Diagnosis Date  . Allergy   . Arthritis    spine  . Bilateral leg edema   . Chest pain 12/15/2014  . Chronic sciatica 12/10/2014  . Chronic sciatica 12/10/2014  . Diabetes mellitus without complication ([place])   . Essential hypertension 12/10/2014  . Essential hypertension 12/10/2014  . Gastroesophageal reflux disease without esophagitis   . GERD (gastroesophageal reflux disease)   . Hyperlipidemia   . Hypertension   . Mixed hyperlipidemia   . Obesity (BMI 30.0-34.9) 12/10/2014  . Type 2 diabetes mellitus without complication ([place]) 3/52/4818    Past Surgical History:  Procedure Laterality Date  . ABDOMINAL HYSTERECTOMY    . COLONOSCOPY WITH PROPOFOL N/A 03/30/2019   Procedure: COLONOSCOPY WITH PROPOFOL;  Surgeon: Lin Landsman, MD;  Location: [facility] ENDOSCOPY;  Service: Gastroenterology;  Laterality: N/A;  . ESOPHAGOGASTRODUODENOSCOPY (EGD) WITH PROPOFOL N/A 03/12/2017   Procedure: ESOPHAGOGASTRODUODENOSCOPY (EGD) WITH PROPOFOL;  Surgeon: Lin Landsman, MD;  Location: [facility] ENDOSCOPY;  Service: Gastroenterology;  Laterality: N/A;  . ESOPHAGOGASTRODUODENOSCOPY (EGD) WITH PROPOFOL N/A 03/30/2019    Procedure: ESOPHAGOGASTRODUODENOSCOPY (EGD) WITH PROPOFOL;  Surgeon: Lin Landsman, MD;  Location: [facility] ENDOSCOPY;  Service: Gastroenterology;  Laterality: N/A;  . HERNIA REPAIR    . UMBILICAL HERNIA REPAIR N/A 05/01/2017   Procedure: HERNIA REPAIR UMBILICAL ADULT;  Surgeon: Vickie Epley, MD;  Location: [facility] ORS;  Service: General;  Laterality: N/A;    There were no vitals filed for this visit.  Subjective Assessment - 05/27/19 1035    Subjective  Pt. reports the tightness and muscle pain in her B hamstring and low back have been decreased since the last PT visit. No recent falls reported.    Pertinent History  B LE neuropathy starting about 2 years ago.  H/o DM type 2.  Pt. retired from working with mentally ill.  Pt. enjoys traveling but limited by Covid.  Pt. watches TV at this time.  Pt. was going to [place] prior to Covid.  Lyrica has been helping (not taking Gabapentin at this time).  Pt. has h/o numerous falls over past year.  See MRI reports.  Pt. has received 1 lumbar nerve block and receives 2nd tomorrow from Dr. Dossie Arbour.  Pt. reports she is limited with shopping due to prolonged standing/walking.  Pt. lives by herself in 1 story house (has stairs to attic).    Limitations  Lifting;Standing;Walking;House hold activities    How long can you stand comfortably?  <1 hour (prefers to lean against counter)  How long can you walk comfortably?  <1 mile    Diagnostic tests  See MRI reports (recent)    Patient Stated Goals  Improve balance/ coordination of feet and legs.  Increase LE muscle strengthening.  Decrease fall risk.    Currently in Pain?  Yes   no objective number was given   Pain Location  Leg    Pain Orientation  Right;Left    Pain Descriptors / Indicators  Numbness    Pain Type  Chronic pain;Neuropathic pain    Pain Onset  More than a month ago    Pain Frequency  Intermittent    Pain Onset  In the past 7 days    Pain Onset  More than a month ago         Therex: Nustep L5 10 min 6 min walk in hallway at pts own pace with SBA TRX sit to stand at a non-handle chair of 17.5 inches height 10 reps x 1 set, SBA TRX sit to stand in staggered stance at a non-handle chair of 17 inches height 10 reps x 1 set on each side, SBA, verbal cueing for feet placement Time up and go: 14.64 s  Neuromuscular re-education: Tandem walk on foam 3 laps x 2 sets, verbal cueing for posture correction Step up with single leg stance and hold leg on the top for 10 s x 5 reps x 2 sets on each side, SBA, verbal cueing for proper techniques     PT Long Term Goals - 05/18/19 1007      PT LONG TERM GOAL #1   Title  Pt. will increase FOTO to 64 to improve safety with functional tasks/ mobility.    Baseline  Initial FOTO: 60; Current FOTO: 44    Time  4    Period  Weeks    Status  Not Met    Target Date  06/15/19      PT LONG TERM GOAL #2   Title  Pt. independent with HEP to increase B LE muscle strength to grossly 4+/5 MMT to improve sit to stands/ safety with walking.    Baseline  Current: bilaterally hip felxion (4-/5), quad (5/5); hamstring, hip abd, hip add (4+/5)    Time  4    Period  Weeks    Status  Partially Met    Target Date  06/15/19      PT LONG TERM GOAL #3   Title  Pt. able to stand from 17" height chair without UE assist safely with no LOB to improve safety at home.    Baseline  Extra time/ B UE assist required.    Time  4    Period  Weeks    Status  Achieved    Target Date  05/18/19      PT LONG TERM GOAL #4   Title  Pt. will increase Berg balance test to >50/56 to improve safety/ decrease fall risk.    Baseline  Berg on evaluation: 47/56.  1/21: 50/56    Time  4    Period  Weeks    Status  Achieved    Target Date  05/18/19      PT LONG TERM GOAL #5   Title  Pt. will ambulate 1 mile with normalized gait pattern and mod. independence safely to promote return to traveling.    Baseline  Gait limited by poor endurance/ balance  issues.  Pt. fatigues easily and requires rest breaks.  Pt. enjoys traveling.  Time  4    Period  Weeks    Status  Partially Met    Target Date  06/15/19      Additional Long Term Goals   Additional Long Term Goals  Yes      PT LONG TERM GOAL #6   Title  Pt. will increase Berg balance test to >52/56 to improve safety/ decrease fall risk.    Baseline  Current: 50/56    Time  4    Period  Weeks    Status  On-going    Target Date  06/15/19      PT LONG TERM GOAL #7   Title  Pt. will be able to perform single leg squat with TRX at a chair of 18.5 inches height to increase unilateral LE strength to improve her safety when get on and off curbs and stairs    Baseline  Pt. is able to perform sit to stand without use of her hands at a chair of 17 inches height    Time  4    Period  Weeks    Status  New    Target Date  06/15/19            Plan - 05/27/19 1049    Clinical Impression Statement  Pt. demonstrates good heel strikes and hip flexion during amubulation in hallway and outdoor. TUG: 14.64 s. Pt. demonstrates increase B LE strength during TRX staggered sit to stand at a chair of 17.5 inches height x 10 reps each side with a minimal rest break between compared to previous session (chair with 18.5 inches height). Pt. demonstrates improved dynamic and static balance during tandem walking on blue foam and step up with single leg stance hold for 10 s with only occasionally touching bar with one hand for maintaining balance.  No LOB.    Examination-Activity Limitations  Stand;Stairs    Examination-Participation Restrictions  Shop    Stability/Clinical Decision Making  Evolving/Moderate complexity    Clinical Decision Making  Moderate    Rehab Potential  Good    PT Frequency  2x / week    PT Duration  4 weeks    PT Treatment/Interventions  ADLs/Self Care Home Management;Moist Heat;Gait training;Stair training;Functional mobility training;Neuromuscular re-education;Balance  training;Therapeutic exercise;Therapeutic activities;Patient/family education;Manual techniques;Passive range of motion;Energy conservation;Electrical Stimulation;Cryotherapy    PT Next Visit Plan  Increase standing activities and outdoor walking to improve LE endurance. Progress single leg balance, obstacle course on boso. TRX single leg squat with airex.    PT Home Exercise Plan  Access Code: [email or web address]    Consulted and Agree with Plan of Care  Patient       Patient will benefit from skilled therapeutic intervention in order to improve the following deficits and impairments:  Abnormal gait, Improper body mechanics, Pain, Postural dysfunction, Decreased coordination, Decreased activity tolerance, Decreased endurance, Decreased range of motion, Decreased strength, Impaired flexibility, Difficulty walking, Decreased balance  Visit Diagnosis: Muscle weakness (generalized)  Balance problems  Pain in left leg  Pain in right leg  Gait difficulty     Problem List Patient Active Problem List   Diagnosis Date Noted  . Other spondylosis, sacral and sacrococcygeal region 04/21/2019  . Abnormal MRI, cervical spine (02/08/2019) 03/25/2019  . Lower extremity weakness (Bilateral) 03/25/2019  . Coordination impairment (lower extremity) 03/25/2019  . Hypomagnesemia 03/24/2019  . Spondylosis without myelopathy or radiculopathy, lumbosacral region 03/24/2019  . Lumbar Facet Hypertrophy 03/24/2019  . Grade 1 Anterolisthesis of L4/5 03/11/2019  .  Chronic hip pain (Bilateral) (R>L) 03/11/2019  . Chronic sacroiliac joint pain (Bilateral) 03/11/2019  . Sacroiliac joint somatic dysfunction (Bilateral) 03/11/2019  . Chronic feet pain (Primary Area of Pain) (Bilateral) 03/11/2019  . Chronic leg and foot pain (Bilateral) 03/11/2019  . Diabetic peripheral neuropathy ([place]) 03/11/2019  . Chronic neuropathic pain 03/11/2019  . Neurogenic pain 03/11/2019  . Chronic musculoskeletal pain 03/11/2019  .  Osteoarthritis involving multiple joints 03/11/2019  . Chronic pain syndrome 03/10/2019  . Pharmacologic therapy 03/10/2019  . Disorder of skeletal system 03/10/2019  . Problems influencing health status 03/10/2019  . Chronic low back pain (Third area of Pain) (Bilateral) w/o sciatica 03/10/2019  . Chronic lower extremity pain (Secondary area of Pain) (Bilateral) 03/10/2019  . Abnormal MRI, lumbar spine (02/08/2019) 03/10/2019  . Essential hypertension 10/06/2018  . Cholelithiasis 06/23/2018  . Fatty liver 06/23/2018  . Helicobacter pylori infection 05/13/2017  . Mastalgia in female 04/09/2017  . Bilateral leg edema 07/02/2016  . DDD (degenerative disc disease), lumbar 12/16/2014  . Lumbar facet syndrome (Bilateral) 12/16/2014  . Sacroiliac joint dysfunction 12/16/2014  . Neuropathy due to secondary diabetes ([place]) 12/16/2014  . Vitamin D deficiency 12/13/2014  . Morbid obesity ([place]) 12/10/2014  . Type 2 diabetes mellitus with diabetic neuropathy, unspecified ([place]) 12/10/2014  . Hyperlipidemia 12/10/2014  . Gastroesophageal reflux disease without esophagitis 12/10/2014   Pura Spice, PT, DPT # [id number] Mittie Bodo, SPT 05/28/2019, 10:10 AM  [place] [facility] [address] [place], [place], [address] Phone: [phone]   Fax:  [phone]  Name: Faatimah Spielberg MRN: [id number] Date of Birth: Jan 12, 1961

## 2019-05-28 ENCOUNTER — Telehealth: Payer: Self-pay | Admitting: *Deleted

## 2019-05-28 NOTE — Telephone Encounter (Signed)
Spoke with patient and let her know what Dr Laban Emperor said.  She verbalizes u/o information.

## 2019-05-28 NOTE — Telephone Encounter (Signed)
After talking with Dr Lisa Galvan attempted to call patient to clarify procedure after taking covid vaccine.  No answer and mailbox is full.

## 2019-05-28 NOTE — Telephone Encounter (Signed)
Patient called re; covid vaccine vs procedure and if there is any reason to wait on this?  As per Dr Cherylann Ratel there is no problem having procedure after having immunization.

## 2019-06-01 ENCOUNTER — Ambulatory Visit: Payer: BLUE CROSS/BLUE SHIELD | Admitting: Physical Therapy

## 2019-06-01 ENCOUNTER — Other Ambulatory Visit: Payer: Self-pay

## 2019-06-01 ENCOUNTER — Encounter: Payer: Self-pay | Admitting: Physical Therapy

## 2019-06-01 DIAGNOSIS — M79604 Pain in right leg: Secondary | ICD-10-CM | POA: Diagnosis not present

## 2019-06-01 DIAGNOSIS — E1165 Type 2 diabetes mellitus with hyperglycemia: Secondary | ICD-10-CM | POA: Diagnosis not present

## 2019-06-01 DIAGNOSIS — R269 Unspecified abnormalities of gait and mobility: Secondary | ICD-10-CM

## 2019-06-01 DIAGNOSIS — R2689 Other abnormalities of gait and mobility: Secondary | ICD-10-CM

## 2019-06-01 DIAGNOSIS — M6281 Muscle weakness (generalized): Secondary | ICD-10-CM

## 2019-06-01 DIAGNOSIS — M79605 Pain in left leg: Secondary | ICD-10-CM

## 2019-06-01 DIAGNOSIS — G63 Polyneuropathy in diseases classified elsewhere: Secondary | ICD-10-CM | POA: Diagnosis not present

## 2019-06-01 DIAGNOSIS — E118 Type 2 diabetes mellitus with unspecified complications: Secondary | ICD-10-CM | POA: Diagnosis not present

## 2019-06-01 DIAGNOSIS — G219 Secondary parkinsonism, unspecified: Secondary | ICD-10-CM | POA: Diagnosis not present

## 2019-06-01 NOTE — Therapy (Signed)
[facility] [address]. [place], [place], [address] Phone: [phone]   Fax:  [phone]  Physical Therapy Treatment  Patient Details  Name: Lisa Galvan MRN: [id number] Date of Birth: Aug 22, 1960 Referring Provider (PT): Dr. Dossie Arbour   Encounter Date: 06/01/2019  PT End of Session - 06/01/19 1245    Visit Number  13    Number of Visits  17    Date for PT Re-Evaluation  06/15/19    Authorization - Visit Number  5    Authorization - Number of Visits  10    PT Start Time  2947    PT Stop Time  1207    PT Time Calculation (min)  46 min    Equipment Utilized During Treatment  Gait belt    Activity Tolerance  Patient tolerated treatment well;Patient limited by fatigue    Behavior During Therapy  [facility] for tasks assessed/performed       Past Medical History:  Diagnosis Date  . Allergy   . Arthritis    spine  . Bilateral leg edema   . Chest pain 12/15/2014  . Chronic sciatica 12/10/2014  . Chronic sciatica 12/10/2014  . Diabetes mellitus without complication ([place])   . Essential hypertension 12/10/2014  . Essential hypertension 12/10/2014  . Gastroesophageal reflux disease without esophagitis   . GERD (gastroesophageal reflux disease)   . Hyperlipidemia   . Hypertension   . Mixed hyperlipidemia   . Obesity (BMI 30.0-34.9) 12/10/2014  . Type 2 diabetes mellitus without complication ([place]) 6/54/6503    Past Surgical History:  Procedure Laterality Date  . ABDOMINAL HYSTERECTOMY    . COLONOSCOPY WITH PROPOFOL N/A 03/30/2019   Procedure: COLONOSCOPY WITH PROPOFOL;  Surgeon: Lin Landsman, MD;  Location: [facility] ENDOSCOPY;  Service: Gastroenterology;  Laterality: N/A;  . ESOPHAGOGASTRODUODENOSCOPY (EGD) WITH PROPOFOL N/A 03/12/2017   Procedure: ESOPHAGOGASTRODUODENOSCOPY (EGD) WITH PROPOFOL;  Surgeon: Lin Landsman, MD;  Location: [facility] ENDOSCOPY;  Service: Gastroenterology;  Laterality: N/A;  . ESOPHAGOGASTRODUODENOSCOPY (EGD)  WITH PROPOFOL N/A 03/30/2019   Procedure: ESOPHAGOGASTRODUODENOSCOPY (EGD) WITH PROPOFOL;  Surgeon: Lin Landsman, MD;  Location: [facility] ENDOSCOPY;  Service: Gastroenterology;  Laterality: N/A;  . HERNIA REPAIR    . UMBILICAL HERNIA REPAIR N/A 05/01/2017   Procedure: HERNIA REPAIR UMBILICAL ADULT;  Surgeon: Vickie Epley, MD;  Location: [facility] ORS;  Service: General;  Laterality: N/A;    There were no vitals filed for this visit.  Subjective Assessment - 06/01/19 1237    Subjective  Pt. is 5 min late for today's session because pt just finishes her app with her Neurologist and she is waiting for a call to schedule a brain MRI. Pt. reports she will have a RF oblation procedure to low back done tommorrow and she will come back to clinic on Thursday morning. Pt. reports increased fatigue and increased soreness in low back (3/10) from cleaning up her house during this past weeknend. Pt. reports 3/10 B numbness in her legs. No recent falls reported.    Pertinent History  B LE neuropathy starting about 2 years ago.  H/o DM type 2.  Pt. retired from working with mentally ill.  Pt. enjoys traveling but limited by Covid.  Pt. watches TV at this time.  Pt. was going to [place] prior to Covid.  Lyrica has been helping (not taking Gabapentin at this time).  Pt. has h/o numerous falls over past year.  See MRI reports.  Pt. has received 1 lumbar  nerve block and receives 2nd tomorrow from Dr. Dossie Arbour.  Pt. reports she is limited with shopping due to prolonged standing/walking.  Pt. lives by herself in 1 story house (has stairs to attic).    Limitations  Lifting;Standing;Walking;House hold activities    How long can you stand comfortably?  <1 hour (prefers to lean against counter)    How long can you walk comfortably?  <1 mile    Diagnostic tests  See MRI reports (recent)    Patient Stated Goals  Improve balance/ coordination of feet and legs.  Increase LE muscle strengthening.  Decrease fall risk.     Currently in Pain?  Yes    Pain Score  3     Pain Location  Leg    Pain Orientation  Left;Right    Pain Descriptors / Indicators  Numbness    Pain Type  Chronic pain;Neuropathic pain    Pain Onset  More than a month ago    Pain Frequency  Intermittent    Multiple Pain Sites  Yes    Pain Score  3    Pain Location  Back    Pain Orientation  Lower    Pain Descriptors / Indicators  Sore    Pain Type  Chronic pain    Pain Onset  More than a month ago    Pain Frequency  Intermittent       Neuromuscular re-education: Obstacle course in hall way (step on and off a stair, step over too big hurdle, step on and off boso ball, marching on airex) with CGA and verbal cueing for posture correction, 3 laps. Walking outdoor on grassy and hard surfaces 4 min with CGA, verbal cueing for increasing heel strike and posture correction.  Therex: Seated marching against manual resistance hold for 3 s 10 reps x 2 sets, verbal cueing for isometric holds Seated marching with orange therapeutic band hold for 3 s 10 reps x 1 set Supine hip abd with OTB hold for 3 s 10 reps x 1 set Supine manual hamstring /gluts/ stretch/sciatic nerve glides 1 min each side Supine SLR 8 reps each side 8 reps x 1 set with slight manual assistance at the end of 2-3 reps Supine bridge 8 reps x 1 set       PT Long Term Goals - 05/18/19 1007      PT LONG TERM GOAL #1   Title  Pt. will increase FOTO to 64 to improve safety with functional tasks/ mobility.    Baseline  Initial FOTO: 60; Current FOTO: 44    Time  4    Period  Weeks    Status  Not Met    Target Date  06/15/19      PT LONG TERM GOAL #2   Title  Pt. independent with HEP to increase B LE muscle strength to grossly 4+/5 MMT to improve sit to stands/ safety with walking.    Baseline  Current: bilaterally hip felxion (4-/5), quad (5/5); hamstring, hip abd, hip add (4+/5)    Time  4    Period  Weeks    Status  Partially Met    Target Date  06/15/19       PT LONG TERM GOAL #3   Title  Pt. able to stand from 17" height chair without UE assist safely with no LOB to improve safety at home.    Baseline  Extra time/ B UE assist required.    Time  4    Period  Weeks    Status  Achieved    Target Date  05/18/19      PT LONG TERM GOAL #4   Title  Pt. will increase Berg balance test to >50/56 to improve safety/ decrease fall risk.    Baseline  Berg on evaluation: 47/56.  1/21: 50/56    Time  4    Period  Weeks    Status  Achieved    Target Date  05/18/19      PT LONG TERM GOAL #5   Title  Pt. will ambulate 1 mile with normalized gait pattern and mod. independence safely to promote return to traveling.    Baseline  Gait limited by poor endurance/ balance issues.  Pt. fatigues easily and requires rest breaks.  Pt. enjoys traveling.    Time  4    Period  Weeks    Status  Partially Met    Target Date  06/15/19      Additional Long Term Goals   Additional Long Term Goals  Yes      PT LONG TERM GOAL #6   Title  Pt. will increase Berg balance test to >52/56 to improve safety/ decrease fall risk.    Baseline  Current: 50/56    Time  4    Period  Weeks    Status  On-going    Target Date  06/15/19      PT LONG TERM GOAL #7   Title  Pt. will be able to perform single leg squat with TRX at a chair of 18.5 inches height to increase unilateral LE strength to improve her safety when get on and off curbs and stairs    Baseline  Pt. is able to perform sit to stand without use of her hands at a chair of 17 inches height    Time  4    Period  Weeks    Status  New    Target Date  06/15/19          Plan - 06/01/19 1255    Clinical Impression Statement  Pt. comes to clinic with increased fatigue from house cleaning up this past weekend, PT session is modified accordingly. Pt. requires use of one hand for UE assist on wall during tandem walking on foam and boso step on and off in hallway to maintain balance. Pt. requires moderate assist and CGA  during boso ball step on and off. Pt. demonstrates increased difficulty/heaviness during SLR and needs slight manual assist during the last 2-3 reps to lift leg up without overcompensating. Pt. presents with reduced speed and hip flexion during outdoor walking and requires verbal cueing to increase heel strike and posture correction.    Examination-Activity Limitations  Stand;Stairs    Examination-Participation Restrictions  Shop    Stability/Clinical Decision Making  Evolving/Moderate complexity    Clinical Decision Making  Moderate    Rehab Potential  Good    PT Frequency  2x / week    PT Duration  4 weeks    PT Treatment/Interventions  ADLs/Self Care Home Management;Moist Heat;Gait training;Stair training;Functional mobility training;Neuromuscular re-education;Balance training;Therapeutic exercise;Therapeutic activities;Patient/family education;Manual techniques;Passive range of motion;Energy conservation;Electrical Stimulation;Cryotherapy    PT Next Visit Plan  Ask about nerve burn procedure on Tuesday. Increase standing activities to improve LE endurance. Progress single leg balance (step forward,side and backward, TRX single leg squat with airex.    PT Home Exercise Plan  Access Code: [email or web address]    Consulted and Agree with Plan of Care  Patient       Patient will benefit from skilled therapeutic intervention in order to improve the following deficits and impairments:  Abnormal gait, Improper body mechanics, Pain, Postural dysfunction, Decreased coordination, Decreased activity tolerance, Decreased endurance, Decreased range of motion, Decreased strength, Impaired flexibility, Difficulty walking, Decreased balance  Visit Diagnosis: Muscle weakness (generalized)  Balance problems  Pain in left leg  Pain in right leg  Gait difficulty     Problem List Patient Active Problem List   Diagnosis Date Noted  . History of allergy to radiographic contrast media 06/02/2019  . History of  allergy to shellfish 06/02/2019  . History of Allergy to iodine 06/02/2019    Class: History of  . History of anaphylaxis 06/02/2019  . Other spondylosis, sacral and sacrococcygeal region 04/21/2019  . Abnormal MRI, cervical spine (02/08/2019) 03/25/2019  . Lower extremity weakness (Bilateral) 03/25/2019  . Coordination impairment (lower extremity) 03/25/2019  . Hypomagnesemia 03/24/2019  . Spondylosis without myelopathy or radiculopathy, lumbosacral region 03/24/2019  . Lumbar Facet Hypertrophy 03/24/2019  . Grade 1 Anterolisthesis of L4/5 03/11/2019  . Chronic hip pain (Bilateral) (R>L) 03/11/2019  . Chronic sacroiliac joint pain (Bilateral) 03/11/2019  . Sacroiliac joint somatic dysfunction (Bilateral) 03/11/2019  . Chronic feet pain (Primary Area of Pain) (Bilateral) 03/11/2019  . Chronic leg and foot pain (Bilateral) 03/11/2019  . Diabetic peripheral neuropathy ([place]) 03/11/2019  . Chronic neuropathic pain 03/11/2019  . Neurogenic pain 03/11/2019  . Chronic musculoskeletal pain 03/11/2019  . Osteoarthritis involving multiple joints 03/11/2019  . Chronic pain syndrome 03/10/2019  . Pharmacologic therapy 03/10/2019  . Disorder of skeletal system 03/10/2019  . Problems influencing health status 03/10/2019  . Chronic low back pain (Third area of Pain) (Bilateral) w/o sciatica 03/10/2019  . Chronic lower extremity pain (Secondary area of Pain) (Bilateral) 03/10/2019  . Abnormal MRI, lumbar spine (02/08/2019) 03/10/2019  . Essential hypertension 10/06/2018  . Cholelithiasis 06/23/2018  . Fatty liver 06/23/2018  . Helicobacter pylori infection 05/13/2017  . Mastalgia in female 04/09/2017  . Bilateral leg edema 07/02/2016  . DDD (degenerative disc disease), lumbar 12/16/2014  . Lumbar facet syndrome (Bilateral) 12/16/2014  . Sacroiliac joint dysfunction 12/16/2014  . Neuropathy due to secondary diabetes ([place]) 12/16/2014  . Vitamin D deficiency 12/13/2014  . Morbid obesity ([place])  12/10/2014  . Type 2 diabetes mellitus with diabetic neuropathy, unspecified ([place]) 12/10/2014  . Hyperlipidemia 12/10/2014  . Gastroesophageal reflux disease without esophagitis 12/10/2014   Pura Spice, PT, DPT # [id number] Mittie Bodo, SPT 06/02/2019, 8:09 AM   [facility] [address] [place], [place], [address] Phone: [phone]   Fax:  [phone]  Name: Irie Dowson MRN: [id number] Date of Birth: 06-Apr-1961

## 2019-06-02 ENCOUNTER — Ambulatory Visit (HOSPITAL_BASED_OUTPATIENT_CLINIC_OR_DEPARTMENT_OTHER): Payer: BLUE CROSS/BLUE SHIELD | Admitting: Pain Medicine

## 2019-06-02 ENCOUNTER — Ambulatory Visit
Admission: RE | Admit: 2019-06-02 | Discharge: 2019-06-02 | Disposition: A | Discharge status: Home / Self Care | Payer: BLUE CROSS/BLUE SHIELD | Source: Ambulatory Visit | Attending: Pain Medicine | Admitting: Pain Medicine

## 2019-06-02 ENCOUNTER — Other Ambulatory Visit: Payer: Self-pay | Admitting: Neurology

## 2019-06-02 ENCOUNTER — Other Ambulatory Visit: Payer: Self-pay

## 2019-06-02 ENCOUNTER — Encounter: Payer: Self-pay | Admitting: Pain Medicine

## 2019-06-02 VITALS — BP 138/81 | HR 84 | Temp 97.1°F | Resp 16 | Ht 63.0 in | Wt 179.0 lb

## 2019-06-02 DIAGNOSIS — M5136 Other intervertebral disc degeneration, lumbar region: Secondary | ICD-10-CM | POA: Diagnosis not present

## 2019-06-02 DIAGNOSIS — Z91013 Allergy to seafood: Secondary | ICD-10-CM | POA: Diagnosis not present

## 2019-06-02 DIAGNOSIS — M545 Low back pain: Secondary | ICD-10-CM

## 2019-06-02 DIAGNOSIS — M47817 Spondylosis without myelopathy or radiculopathy, lumbosacral region: Secondary | ICD-10-CM

## 2019-06-02 DIAGNOSIS — M47816 Spondylosis without myelopathy or radiculopathy, lumbar region: Secondary | ICD-10-CM

## 2019-06-02 DIAGNOSIS — Z87892 Personal history of anaphylaxis: Secondary | ICD-10-CM | POA: Diagnosis not present

## 2019-06-02 DIAGNOSIS — G8918 Other acute postprocedural pain: Secondary | ICD-10-CM | POA: Insufficient documentation

## 2019-06-02 DIAGNOSIS — Z888 Allergy status to other drugs, medicaments and biological substances status: Secondary | ICD-10-CM | POA: Diagnosis not present

## 2019-06-02 DIAGNOSIS — M4310 Spondylolisthesis, site unspecified: Secondary | ICD-10-CM

## 2019-06-02 DIAGNOSIS — G219 Secondary parkinsonism, unspecified: Secondary | ICD-10-CM

## 2019-06-02 DIAGNOSIS — Z91041 Radiographic dye allergy status: Secondary | ICD-10-CM | POA: Diagnosis not present

## 2019-06-02 DIAGNOSIS — G8929 Other chronic pain: Secondary | ICD-10-CM | POA: Insufficient documentation

## 2019-06-02 MED ORDER — TRIAMCINOLONE ACETONIDE 40 MG/ML IJ SUSP
40.0000 mg | Freq: Once | INTRAMUSCULAR | Status: AC
  Administered 2019-06-02: 40 mg

## 2019-06-02 MED ORDER — HYDROCODONE-ACETAMINOPHEN 5-325 MG PO TABS: 1.0000 | ORAL_TABLET | Freq: Four times a day (QID) | ORAL | 0 refills | Status: AC | PRN

## 2019-06-02 MED ORDER — TRIAMCINOLONE ACETONIDE 40 MG/ML IJ SUSP
INTRAMUSCULAR | Status: AC
  Filled 2019-06-02: qty 1

## 2019-06-02 MED ORDER — ROPIVACAINE HCL 2 MG/ML IJ SOLN
INTRAMUSCULAR | Status: AC
  Filled 2019-06-02: qty 10

## 2019-06-02 MED ORDER — LACTATED RINGERS IV SOLN
1000.0000 mL | Freq: Once | INTRAVENOUS | Status: AC
  Administered 2019-06-02: 1000 mL via INTRAVENOUS

## 2019-06-02 MED ORDER — ROPIVACAINE HCL 2 MG/ML IJ SOLN
9.0000 mL | Freq: Once | INTRAMUSCULAR | Status: AC
  Administered 2019-06-02: 9 mL via PERINEURAL

## 2019-06-02 MED ORDER — MIDAZOLAM HCL 5 MG/5ML IJ SOLN
1.0000 mg | INTRAMUSCULAR | Status: DC | PRN
  Administered 2019-06-02: 1 mg via INTRAVENOUS

## 2019-06-02 MED ORDER — FENTANYL CITRATE (PF) 100 MCG/2ML IJ SOLN
25.0000 ug | INTRAMUSCULAR | Status: DC | PRN
  Administered 2019-06-02: 50 ug via INTRAVENOUS

## 2019-06-02 MED ORDER — FENTANYL CITRATE (PF) 100 MCG/2ML IJ SOLN
INTRAMUSCULAR | Status: AC
  Filled 2019-06-02: qty 2

## 2019-06-02 MED ORDER — LIDOCAINE HCL 2 % IJ SOLN
INTRAMUSCULAR | Status: AC
  Filled 2019-06-02: qty 20

## 2019-06-02 MED ORDER — DIPHENHYDRAMINE HCL 50 MG/ML IJ SOLN
INTRAMUSCULAR | Status: AC
  Filled 2019-06-02: qty 1

## 2019-06-02 MED ORDER — LIDOCAINE HCL 2 % IJ SOLN
20.0000 mL | Freq: Once | INTRAMUSCULAR | Status: AC
  Administered 2019-06-02: 400 mg

## 2019-06-02 MED ORDER — DIPHENHYDRAMINE HCL 50 MG/ML IJ SOLN
12.5000 mg | Freq: Once | INTRAMUSCULAR | Status: AC
  Administered 2019-06-02: 11:00:00 12.5 mg via INTRAVENOUS

## 2019-06-02 MED ORDER — MIDAZOLAM HCL 5 MG/5ML IJ SOLN
INTRAMUSCULAR | Status: AC
  Filled 2019-06-02: qty 5

## 2019-06-02 NOTE — Patient Instructions (Signed)

## 2019-06-02 NOTE — Progress Notes (Signed)
Safety precautions to be maintained throughout the outpatient stay will include: orient to surroundings, keep bed in low position, maintain call bell within reach at all times, provide assistance with transfer out of bed and ambulation.  

## 2019-06-02 NOTE — Progress Notes (Signed)
PROVIDER NOTE: Information contained herein reflects review and annotations entered in association with encounter. Interpretation of such information and data should be left to medically-trained personnel. Information provided to patient can be located elsewhere in the medical record under "Patient Instructions". Document created using STT-dictation technology, any transcriptional errors that may result from process are unintentional.    Patient: Lisa Galvan  Service Category: Procedure  Provider: Oswaldo Done, MD  DOB: 1960-08-05  DOS: 06/02/2019  Location: [facility]  MRN: [id number]  Setting: Ambulatory - outpatient  Referring Provider: Delano Metz, MD  Type: Established Patient  Specialty: Interventional Pain Management  PCP: Dorcas Carrow, DO   Primary Reason for Visit: Interventional Pain Management Treatment. CC: Back Pain  Procedure:          Anesthesia, Analgesia, Anxiolysis:  Type: Thermal Lumbar Facet, Medial Branch Radiofrequency Ablation/Neurotomy  #1  Primary Purpose: Therapeutic Region: Posterolateral Lumbosacral Spine Level: L2, L3, L4, L5, & S1 Medial Branch Level(s). These levels will denervate the L3-4, L4-5, and the L5-S1 lumbar facet joints. Laterality: Right  Type: Moderate (Conscious) Sedation combined with Local Anesthesia Indication(s): Analgesia and Anxiety Route: Intravenous (IV) IV Access: Secured Sedation: Meaningful verbal contact was maintained at all times during the procedure  Local Anesthetic: Lidocaine 1-2%  Position: Prone   Indications: 1. Lumbar facet syndrome (Bilateral)   2. Spondylosis without myelopathy or radiculopathy, lumbosacral region   3. Lumbar Facet Hypertrophy   4. Grade 1 Anterolisthesis of L4/5   5. DDD (degenerative disc disease), lumbar   6. Chronic low back pain (Third area of Pain) (Bilateral) w/o sciatica    Lisa Galvan has been dealing with the above chronic pain for longer than three months and  has either failed to respond, was unable to tolerate, or simply did not get enough benefit from other more conservative therapies including, but not limited to: 1. Over-the-counter medications 2. Anti-inflammatory medications 3. Muscle relaxants 4. Membrane stabilizers 5. Opioids 6. Physical therapy and/or chiropractic manipulation 7. Modalities (Heat, ice, etc.) 8. Invasive techniques such as nerve blocks. Lisa Galvan has attained more than 50% relief of the pain from a series of diagnostic injections conducted in separate occasions.  Pain Score: Pre-procedure: 3 /10 Post-procedure: 0-No pain/10  Pre-op Assessment:  Lisa Galvan is a 59 y.o. (year old), female patient, seen today for interventional treatment. She  has a past surgical history that includes Abdominal hysterectomy; Esophagogastroduodenoscopy (egd) with propofol (N/A, 03/12/2017); Umbilical hernia repair (N/A, 05/01/2017); Hernia repair; Esophagogastroduodenoscopy (egd) with propofol (N/A, 03/30/2019); and Colonoscopy with propofol (N/A, 03/30/2019). Lisa Galvan has a current medication list which includes the following prescription(s): amlodipine, atorvastatin, vitamin d-3, duloxetine, jardiance, epinephrine, fexofenadine, hydrochlorothiazide, ibuprofen, ketoconazole, magnesium oxide, metformin, montelukast, pioglitazone, pregabalin, valacyclovir, hydrocodone-acetaminophen, [START ON 06/09/2019] hydrocodone-acetaminophen, and omeprazole, and the following Facility-Administered Medications: fentanyl and midazolam. Her primarily concern today is the Back Pain  Initial Vital Signs:  Pulse/HCG Rate: 84ECG Heart Rate: 86 Temp: (!) 97.1 F (36.2 C) Resp: (!) 22 BP: (!) 128/94 SpO2: 99 %  BMI: Estimated body mass index is 31.71 kg/m as calculated from the following:   Height as of this encounter: 5\' 3"  (1.6 m).   Weight as of this encounter: 179 lb (81.2 kg).  Risk Assessment: Allergies: Reviewed. She is allergic to amlodipine  besy-benazepril hcl; ivp dye [iodinated diagnostic agents]; shellfish allergy; shellfish-derived products; and ace inhibitors.  Allergy Precautions: None required Coagulopathies: Reviewed. None identified.  Blood-thinner therapy: None at this time Active Infection(s): Reviewed. None  identified. [name] is afebrile  Site Confirmation: Lisa Galvan was asked to confirm the procedure and laterality before marking the site Procedure checklist: Completed Consent: Before the procedure and under the influence of no sedative(s), amnesic(s), or anxiolytics, the patient was informed of the treatment options, risks and possible complications. To fulfill our ethical and legal obligations, as recommended by the American Medical Association's Code of Ethics, I have informed the patient of my clinical impression; the nature and purpose of the treatment or procedure; the risks, benefits, and possible complications of the intervention; the alternatives, including doing nothing; the risk(s) and benefit(s) of the alternative treatment(s) or procedure(s); and the risk(s) and benefit(s) of doing nothing. The patient was provided information about the general risks and possible complications associated with the procedure. These may include, but are not limited to: failure to achieve desired goals, infection, bleeding, organ or nerve damage, allergic reactions, paralysis, and death. In addition, the patient was informed of those risks and complications associated to Spine-related procedures, such as failure to decrease pain; infection (i.e.: Meningitis, epidural or intraspinal abscess); bleeding (i.e.: epidural hematoma, subarachnoid hemorrhage, or any other type of intraspinal or peri-dural bleeding); organ or nerve damage (i.e.: Any type of peripheral nerve, nerve root, or spinal cord injury) with subsequent damage to sensory, motor, and/or autonomic systems, resulting in permanent pain, numbness, and/or weakness of one or  several areas of the body; allergic reactions; (i.e.: anaphylactic reaction); and/or death. Furthermore, the patient was informed of those risks and complications associated with the medications. These include, but are not limited to: allergic reactions (i.e.: anaphylactic or anaphylactoid reaction(s)); adrenal axis suppression; blood sugar elevation that in diabetics may result in ketoacidosis or comma; water retention that in patients with history of congestive heart failure may result in shortness of breath, pulmonary edema, and decompensation with resultant heart failure; weight gain; swelling or edema; medication-induced neural toxicity; particulate matter embolism and blood vessel occlusion with resultant organ, and/or nervous system infarction; and/or aseptic necrosis of one or more joints. Finally, the patient was informed that Medicine is not an exact science; therefore, there is also the possibility of unforeseen or unpredictable risks and/or possible complications that may result in a catastrophic outcome. The patient indicated having understood very clearly. We have given the patient no guarantees and we have made no promises. Enough time was given to the patient to ask questions, all of which were answered to the patient's satisfaction. Ms. Marquina has indicated that she wanted to continue with the procedure. Attestation: I, the ordering provider, attest that I have discussed with the patient the benefits, risks, side-effects, alternatives, likelihood of achieving goals, and potential problems during recovery for the procedure that I have provided informed consent. Date  Time: 06/02/2019 10:54 AM  Pre-Procedure Preparation:  Monitoring: As per clinic protocol. Respiration, ETCO2, SpO2, BP, heart rate and rhythm monitor placed and checked for adequate function Safety Precautions: Patient was assessed for positional comfort and pressure points before starting the procedure. Time-out: I initiated and  conducted the "Time-out" before starting the procedure, as per protocol. The patient was asked to participate by confirming the accuracy of the "Time Out" information. Verification of the correct person, site, and procedure were performed and confirmed by me, the nursing staff, and the patient. "Time-out" conducted as per Joint Commission's Universal Protocol (UP.01.01.01). Time: 1120  Description of Procedure:          Laterality: Right Levels:  L2, L3, L4, L5, & S1 Medial Branch Level(s), at  the L3-4, L4-5, and the L5-S1 lumbar facet joints. Area Prepped: Lumbosacral Prepping solution: DuraPrep (Iodine Povacrylex [0.7% available iodine] and Isopropyl Alcohol, 74% w/w) Safety Precautions: Aspiration looking for blood return was conducted prior to all injections. At no point did we inject any substances, as a needle was being advanced. Before injecting, the patient was told to immediately notify me if she was experiencing any new onset of "ringing in the ears, or metallic taste in the mouth". No attempts were made at seeking any paresthesias. Safe injection practices and needle disposal techniques used. Medications properly checked for expiration dates. SDV (single dose vial) medications used. After the completion of the procedure, all disposable equipment used was discarded in the proper designated medical waste containers. Local Anesthesia: Protocol guidelines were followed. The patient was positioned over the fluoroscopy table. The area was prepped in the usual manner. The time-out was completed. The target area was identified using fluoroscopy. A 12-in long, straight, sterile hemostat was used with fluoroscopic guidance to locate the targets for each level blocked. Once located, the skin was marked with an approved surgical skin marker. Once all sites were marked, the skin (epidermis, dermis, and hypodermis), as well as deeper tissues (fat, connective tissue and muscle) were infiltrated with a small  amount of a short-acting local anesthetic, loaded on a 10cc syringe with a 25G, 1.5-in  Needle. An appropriate amount of time was allowed for local anesthetics to take effect before proceeding to the next step. Local Anesthetic: Lidocaine 2.0% The unused portion of the local anesthetic was discarded in the proper designated containers. Technical explanation of process:  Radiofrequency Ablation (RFA) L2 Medial Branch Nerve RFA: The target area for the L2 medial branch is at the junction of the postero-lateral aspect of the superior articular process and the superior, posterior, and medial edge of the transverse process of L3. Under fluoroscopic guidance, a Radiofrequency needle was inserted until contact was made with os over the superior postero-lateral aspect of the pedicular shadow (target area). Sensory and motor testing was conducted to properly adjust the position of the needle. Once satisfactory placement of the needle was achieved, the numbing solution was slowly injected after negative aspiration for blood. 2.0 mL of the nerve block solution was injected without difficulty or complication. After waiting for at least 3 minutes, the ablation was performed. Once completed, the needle was removed intact. L3 Medial Branch Nerve RFA: The target area for the L3 medial branch is at the junction of the postero-lateral aspect of the superior articular process and the superior, posterior, and medial edge of the transverse process of L4. Under fluoroscopic guidance, a Radiofrequency needle was inserted until contact was made with os over the superior postero-lateral aspect of the pedicular shadow (target area). Sensory and motor testing was conducted to properly adjust the position of the needle. Once satisfactory placement of the needle was achieved, the numbing solution was slowly injected after negative aspiration for blood. 2.0 mL of the nerve block solution was injected without difficulty or complication.  After waiting for at least 3 minutes, the ablation was performed. Once completed, the needle was removed intact. L4 Medial Branch Nerve RFA: The target area for the L4 medial branch is at the junction of the postero-lateral aspect of the superior articular process and the superior, posterior, and medial edge of the transverse process of L5. Under fluoroscopic guidance, a Radiofrequency needle was inserted until contact was made with os over the superior postero-lateral aspect of the pedicular shadow (target  area). Sensory and motor testing was conducted to properly adjust the position of the needle. Once satisfactory placement of the needle was achieved, the numbing solution was slowly injected after negative aspiration for blood. 2.0 mL of the nerve block solution was injected without difficulty or complication. After waiting for at least 3 minutes, the ablation was performed. Once completed, the needle was removed intact. L5 Medial Branch Nerve RFA: The target area for the L5 medial branch is at the junction of the postero-lateral aspect of the superior articular process of S1 and the superior, posterior, and medial edge of the sacral ala. Under fluoroscopic guidance, a Radiofrequency needle was inserted until contact was made with os over the superior postero-lateral aspect of the pedicular shadow (target area). Sensory and motor testing was conducted to properly adjust the position of the needle. Once satisfactory placement of the needle was achieved, the numbing solution was slowly injected after negative aspiration for blood. 2.0 mL of the nerve block solution was injected without difficulty or complication. After waiting for at least 3 minutes, the ablation was performed. Once completed, the needle was removed intact. S1 Medial Branch Nerve RFA: The target area for the S1 medial branch is located inferior to the junction of the S1 superior articular process and the L5 inferior articular process, posterior,  inferior, and lateral to the 6 o'clock position of the L5-S1 facet joint, just superior to the S1 posterior foramen. Under fluoroscopic guidance, the Radiofrequency needle was advanced until contact was made with os over the Target area. Sensory and motor testing was conducted to properly adjust the position of the needle. Once satisfactory placement of the needle was achieved, the numbing solution was slowly injected after negative aspiration for blood. 2.0 mL of the nerve block solution was injected without difficulty or complication. After waiting for at least 3 minutes, the ablation was performed. Once completed, the needle was removed intact. Radiofrequency lesioning (ablation):  Radiofrequency Generator: NeuroTherm [id number] Sensory Stimulation Parameters: 50 Hz was used to locate & identify the nerve, making sure that the needle was positioned such that there was no sensory stimulation below 0.3 V or above 0.7 V. Motor Stimulation Parameters: 2 Hz was used to evaluate the motor component. Care was taken not to lesion any nerves that demonstrated motor stimulation of the lower extremities at an output of less than 2.5 times that of the sensory threshold, or a maximum of 2.0 V. Lesioning Technique Parameters: Standard Radiofrequency settings. (Not bipolar or pulsed.) Temperature Settings: 80 degrees C Lesioning time: 60 seconds Intra-operative Compliance: Compliant Materials & Medications: Needle(s) (Electrode/Cannula) Type: Teflon-coated, curved tip, Radiofrequency needle(s) Gauge: 22G Length: 10cm Numbing solution: 0.2% PF-Ropivacaine + Triamcinolone (40 mg/mL) diluted to a final concentration of 4 mg of Triamcinolone/mL of Ropivacaine The unused portion of the solution was discarded in the proper designated containers.  Once the entire procedure was completed, the treated area was cleaned, making sure to leave some of the prepping solution back to take advantage of its long term bactericidal  properties.  Illustration of the posterior view of the lumbar spine and the posterior neural structures. Laminae of L2 through S1 are labeled. DPRL5, dorsal primary ramus of L5; DPRS1, dorsal primary ramus of S1; DPR3, dorsal primary ramus of L3; FJ, facet (zygapophyseal) joint L3-L4; I, inferior articular process of L4; LB1, lateral branch of dorsal primary ramus of L1; IAB, inferior articular branches from L3 medial branch (supplies L4-L5 facet joint); IBP, intermediate branch plexus; MB3, medial branch of dorsal  primary ramus of L3; NR3, third lumbar nerve root; S, superior articular process of L5; SAB, superior articular branches from L4 (supplies L4-5 facet joint also); TP3, transverse process of L3.  Vitals:   06/02/19 1204 06/02/19 1210 06/02/19 1220 06/02/19 1230  BP: 118/82 130/77 124/78 138/81  Pulse:      Resp: 17 16 14 16   Temp:      SpO2: 98% 97% 98% 98%  Weight:      Height:       Start Time: 1120 hrs. End Time: 1202 hrs.  Imaging Guidance (Spinal):          Type of Imaging Technique: Fluoroscopy Guidance (Spinal) Indication(s): Assistance in needle guidance and placement for procedures requiring needle placement in or near specific anatomical locations not easily accessible without such assistance. Exposure Time: Please see nurses notes. Contrast: None used. Fluoroscopic Guidance: I was personally present during the use of fluoroscopy. "Tunnel Vision Technique" used to obtain the best possible view of the target area. Parallax error corrected before commencing the procedure. "Direction-depth-direction" technique used to introduce the needle under continuous pulsed fluoroscopy. Once target was reached, antero-posterior, oblique, and lateral fluoroscopic projection used confirm needle placement in all planes. Images permanently stored in EMR. Interpretation: No contrast injected. I personally interpreted the imaging intraoperatively. Adequate needle placement confirmed in multiple  planes. Permanent images saved into the patient's record.  Antibiotic Prophylaxis:   Anti-infectives (From admission, onward)   None     Indication(s): None identified  Post-operative Assessment:  Post-procedure Vital Signs:  Pulse/HCG Rate: 8478 Temp: (!) 97.1 F (36.2 C) Resp: 16 BP: 138/81 SpO2: 98 %  EBL: None  Complications: No immediate post-treatment complications observed by team, or reported by patient.  Note: The patient tolerated the entire procedure well. A repeat set of vitals were taken after the procedure and the patient was kept under observation following institutional policy, for this type of procedure. Post-procedural neurological assessment was performed, showing return to baseline, prior to discharge. The patient was provided with post-procedure discharge instructions, including a section on how to identify potential problems. Should any problems arise concerning this procedure, the patient was given instructions to immediately contact , at any time, without hesitation. In any case, we plan to contact the patient by telephone for a follow-up status report regarding this interventional procedure.  Comments:  No additional relevant information.  Plan of Care  Orders:  Orders Placed This Encounter  Procedures  . Radiofrequency,Lumbar    Scheduling Instructions:     Side(s): Right-sided     Level: L3-4, L4-5, & L5-S1 Facets (L2, L3, L4, L5, & S1 Medial Branch Nerves)     Sedation: With Sedation     Timeframe: Today    Order Specific Question:   Where will this procedure be performed?    Answer:   [facility]  . DG [facility] C-ARM 1-60 MIN NO REPORT    Intraoperative interpretation by procedural physician at [facility].    Standing Status:   Standing    Number of Occurrences:   1    Order Specific Question:   Reason for exam:    Answer:   Assistance in needle guidance and placement for procedures requiring needle placement in or  near specific anatomical locations not easily accessible without such assistance.  . Informed Consent Details: Physician/Practitioner Attestation; Transcribe to consent form and obtain patient signature    Nursing Order: Transcribe to consent form and obtain patient signature. Note: Always  confirm laterality of pain with Ms. Richardson Doppole, before procedure. Procedure: Lumbar Facet Radiofrequency Ablation Indication/Reason: Low Back Pain, with our without leg pain, due to Facet Joint Arthralgia (Joint Pain) known as Lumbar Facet Syndrome, secondary to Lumbar, and/or Lumbosacral Spondylosis (Arthritis of the Spine), without myelopathy or radiculopathy (Nerve Damage). Provider Attestation: I, Kyllie Pettijohn A. Laban EmperorNaveira, MD, (Pain Management Specialist), the physician/practitioner, attest that I have discussed with the patient the benefits, risks, side effects, alternatives, likelihood of achieving goals and potential problems during recovery for the procedure that I have provided informed consent.  . Provide equipment / supplies at bedside    Equipment required: Sterile "Radiofrequency Tray"; Large hemostat (1); Small hemostat (1); Towels (6-8); 4x4 sterile sponge pack (1) Radiofrequency Needle(s): Size: Long Quantity: 5    Standing Status:   Standing    Number of Occurrences:   1    Order Specific Question:   Specify    Answer:   Radiofrequency Tray   Chronic Opioid Analgesic:  No opioid analgesics prescribed by our practice.   Medications ordered for procedure: Meds ordered this encounter  Medications  . lidocaine (XYLOCAINE) 2 % (with pres) injection 400 mg  . lactated ringers infusion 1,000 mL  . midazolam (VERSED) 5 MG/5ML injection 1-2 mg    Make sure Flumazenil is available in the pyxis when using this medication. If oversedation occurs, administer 0.2 mg IV over 15 sec. If after 45 sec no response, administer 0.2 mg again over 1 min; may repeat at 1 min intervals; not to exceed 4 doses (1 mg)  .  fentaNYL (SUBLIMAZE) injection 25-50 mcg    Make sure Narcan is available in the pyxis when using this medication. In the event of respiratory depression (RR< 8/min): Titrate NARCAN (naloxone) in increments of 0.1 to 0.2 mg IV at 2-3 minute intervals, until desired degree of reversal.  . ropivacaine (PF) 2 mg/mL (0.2%) (NAROPIN) injection 9 mL  . triamcinolone acetonide (KENALOG-40) injection 40 mg  . diphenhydrAMINE (BENADRYL) injection 12.5 mg  . HYDROcodone-acetaminophen (NORCO/VICODIN) 5-325 MG tablet    Sig: Take 1 tablet by mouth every 6 (six) hours as needed for up to 7 days for severe pain. Must last 7 days.    Dispense:  28 tablet    Refill:  0    For acute post-operative pain. Not to be refilled. Must last 7 days.  Marland Kitchen. HYDROcodone-acetaminophen (NORCO/VICODIN) 5-325 MG tablet    Sig: Take 1 tablet by mouth every 6 (six) hours as needed for up to 7 days for severe pain. Must last 7 days.    Dispense:  28 tablet    Refill:  0    For acute post-operative pain. Not to be refilled.  Must last 7 days.   Medications administered: We administered lidocaine, lactated ringers, midazolam, fentaNYL, ropivacaine (PF) 2 mg/mL (0.2%), triamcinolone acetonide, and diphenhydrAMINE.  See the medical record for exact dosing, route, and time of administration.  Follow-up plan:   Return in about 2 weeks (around 06/16/2019) for RFA (w/ sedation): (L) L-FCT RFA #1.       Interventional management options: Planned, scheduled, and/or pending:    Therapeutic right-sided lumbar facet + sacroiliac joint RFA #1 under fluoroscopic guidance and IV sedation   Considering:   EMG/PNCV of the lower extremities ordered today. Physical therapy referral for treatment of bilateral lower extremity weakness and lack of coordination, ordered today. Diagnostic bilateral lumbar sympathetic block  Diagnostic bilateral lumbar facet block  Possible bilateral lumbar facet RFA  Diagnostic  bilateral IA hip joint injection    Possible bilateral obturator + femoral NB  Possible bilateral obturator + femoral nerve RFA  Diagnostic bilateral SI joint block  Possible bilateral SI joint RFA    PRN Procedures:   Palliative bilateral lumbar facet block #3  Palliative right sacroiliac joint block #2     Recent Visits Date Type Provider Dept  05/06/19 Telemedicine Milinda Pointer, [facility]  04/21/19 Procedure visit Milinda Pointer, MD [facility]  04/14/19 Telemedicine Milinda Pointer, MD [facility]  04/02/19 Procedure visit Milinda Pointer, MD [facility]  03/25/19 Telemedicine Milinda Pointer, [facility]  03/11/19 Office Visit Milinda Pointer, MD [facility]  Showing recent visits within past 90 days and meeting all other requirements   Today's Visits Date Type Provider Dept  06/02/19 Procedure visit Milinda Pointer, MD [facility]  Showing today's visits and meeting all other requirements   Future Appointments No visits were found meeting these conditions.  Showing future appointments within next 90 days and meeting all other requirements   Disposition: Discharge home  Discharge (Date  Time): 06/02/2019; 1238 hrs.   Primary Care Physician: [name], DO Location: [facility] Note by: Gaspar Cola, MD Date: 06/02/2019; Time: 1:02 PM  Disclaimer:  Medicine is not an Chief Strategy Officer. The only guarantee in medicine is that nothing is guaranteed. It is important to note that the decision to proceed with this intervention was based on the information collected from the patient. The Data and conclusions were drawn from the patient's questionnaire, the interview, and the physical examination. Because the information was provided in large part by the patient, it cannot be guaranteed that it has not been purposely or unconsciously manipulated. Every effort has been made to  obtain as much relevant data as possible for this evaluation. It is important to note that the conclusions that lead to this procedure are derived in large part from the available data. Always take into account that the treatment will also be dependent on availability of resources and existing treatment guidelines, considered by other Pain Management Practitioners as being common knowledge and practice, at the time of the intervention. For Medico-Legal purposes, it is also important to point out that variation in procedural techniques and pharmacological choices are the acceptable norm. The indications, contraindications, technique, and results of the above procedure should only be interpreted and judged by a Board-Certified Interventional Pain Specialist with extensive familiarity and expertise in the same exact procedure and technique.

## 2019-06-03 ENCOUNTER — Telehealth: Payer: Self-pay

## 2019-06-03 NOTE — Telephone Encounter (Signed)
States that she is a lot sore/swollen in her back. Encouraged ice  And instructed to call if needed.

## 2019-06-04 ENCOUNTER — Ambulatory Visit: Payer: BLUE CROSS/BLUE SHIELD | Admitting: Physical Therapy

## 2019-06-08 ENCOUNTER — Encounter: Payer: Self-pay | Admitting: Physical Therapy

## 2019-06-08 ENCOUNTER — Ambulatory Visit: Payer: BLUE CROSS/BLUE SHIELD | Admitting: Physical Therapy

## 2019-06-08 ENCOUNTER — Other Ambulatory Visit: Payer: Self-pay

## 2019-06-08 DIAGNOSIS — R2689 Other abnormalities of gait and mobility: Secondary | ICD-10-CM

## 2019-06-08 DIAGNOSIS — M79605 Pain in left leg: Secondary | ICD-10-CM

## 2019-06-08 DIAGNOSIS — M79604 Pain in right leg: Secondary | ICD-10-CM | POA: Diagnosis not present

## 2019-06-08 DIAGNOSIS — R269 Unspecified abnormalities of gait and mobility: Secondary | ICD-10-CM | POA: Diagnosis not present

## 2019-06-08 DIAGNOSIS — M6281 Muscle weakness (generalized): Secondary | ICD-10-CM

## 2019-06-08 NOTE — Therapy (Signed)
[facility] [address] [place], [place], [address] Phone: [phone]   Fax:  [phone]  Physical Therapy Treatment  Patient Details  Name: Lisa Galvan MRN: [id number] Date of Birth: [birth year] Referring Provider (PT): Dr. Dossie Arbour   Encounter Date: 06/08/2019  PT End of Session - 06/08/19 0911    Visit Number  14    Number of Visits  17    Date for PT Re-Evaluation  06/15/19    Authorization - Visit Number  6    Authorization - Number of Visits  10    PT Start Time  0810    PT Stop Time  0855    PT Time Calculation (min)  45 min    Equipment Utilized During Treatment  Gait belt    Activity Tolerance  Patient tolerated treatment well;Patient limited by fatigue    Behavior During Therapy  [facility] for tasks assessed/performed       Past Medical History:  Diagnosis Date  . Allergy   . Arthritis    spine  . Bilateral leg edema   . Chest pain 12/15/2014  . Chronic sciatica 12/10/2014  . Chronic sciatica 12/10/2014  . Diabetes mellitus without complication ([place])   . Essential hypertension 12/10/2014  . Essential hypertension 12/10/2014  . Gastroesophageal reflux disease without esophagitis   . GERD (gastroesophageal reflux disease)   . Hyperlipidemia   . Hypertension   . Mixed hyperlipidemia   . Obesity (BMI 30.0-34.9) 12/10/2014  . Type 2 diabetes mellitus without complication ([place]) 7/68/1157    Past Surgical History:  Procedure Laterality Date  . ABDOMINAL HYSTERECTOMY    . COLONOSCOPY WITH PROPOFOL N/A 03/30/2019   Procedure: COLONOSCOPY WITH PROPOFOL;  Surgeon: Lin Landsman, MD;  Location: [facility] ENDOSCOPY;  Service: Gastroenterology;  Laterality: N/A;  . ESOPHAGOGASTRODUODENOSCOPY (EGD) WITH PROPOFOL N/A 03/12/2017   Procedure: ESOPHAGOGASTRODUODENOSCOPY (EGD) WITH PROPOFOL;  Surgeon: Lin Landsman, MD;  Location: [facility] ENDOSCOPY;  Service: Gastroenterology;  Laterality: N/A;  . ESOPHAGOGASTRODUODENOSCOPY (EGD)  WITH PROPOFOL N/A 03/30/2019   Procedure: ESOPHAGOGASTRODUODENOSCOPY (EGD) WITH PROPOFOL;  Surgeon: Lin Landsman, MD;  Location: [facility] ENDOSCOPY;  Service: Gastroenterology;  Laterality: N/A;  . HERNIA REPAIR    . UMBILICAL HERNIA REPAIR N/A 05/01/2017   Procedure: HERNIA REPAIR UMBILICAL ADULT;  Surgeon: Vickie Epley, MD;  Location: [facility] ORS;  Service: General;  Laterality: N/A;    There were no vitals filed for this visit.  Subjective Assessment - 06/08/19 0815    Subjective  Pt. reports 3/10 soreness in left low back due to oblation procedure on last Tuesday. Pt. states the procedure has been helping her to reduce numbness in B legs from 3/10 to 2/10. Pt. states she has some hard time when walks on incline surface. Pt. states she can get up and down curbs easier. Pt. has eye apt on this thursday, second vaccination shot and brain MRI this Friday. Pt. will come to PT prior to her vaccination apt on Friday. No recent falls reported.    Pertinent History  B LE neuropathy starting about 2 years ago.  H/o DM type 2.  Pt. retired from working with mentally ill.  Pt. enjoys traveling but limited by Covid.  Pt. watches TV at this time.  Pt. was going to [place] prior to Covid.  Lyrica has been helping (not taking Gabapentin at this time).  Pt. has h/o numerous falls over past year.  See MRI reports.  Pt. has received 1  lumbar nerve block and receives 2nd tomorrow from Dr. Dossie Arbour.  Pt. reports she is limited with shopping due to prolonged standing/walking.  Pt. lives by herself in 1 story house (has stairs to attic).    Limitations  Lifting;Standing;Walking;House hold activities    How long can you stand comfortably?  <1 hour (prefers to lean against counter)    How long can you walk comfortably?  <1 mile    Patient Stated Goals  Improve balance/ coordination of feet and legs.  Increase LE muscle strengthening.  Decrease fall risk.    Currently in Pain?  Yes    Pain Score  2      Pain Location  Back    Pain Orientation  Left;Lower    Pain Descriptors / Indicators  Sore    Pain Type  Acute pain    Pain Onset  In the past 7 days    Pain Frequency  Intermittent    Multiple Pain Sites  Yes    Pain Score  2    Pain Location  Leg    Pain Orientation  Left;Right    Pain Descriptors / Indicators  Numbness    Pain Type  Chronic pain;Neuropathic pain    Pain Onset  More than a month ago    Pain Frequency  Intermittent      Therex:  Nustep L 5 10 min Standing marching with B 3 lbs ankle weights x 3 laps, verbal cueing for posture correction Standing hip abd with B 3 lbs ankle weights x 12 reps each side Standing hip extension B 3 lbs ankle weights x 12 reps each side  Neuromuscular re-education: (Single leg on airex hold for 10 s, Tandem walk on foam) x 3 laps within //-bars with SBA (Single leg stance and foot over cone 3 rep s on each side, step on hard block with single leg stance x 5 s each side) 3 laps within //- bars with CGA, verbal cueing for proper techniques. Stair negotiation x 6 laps without UE assist, SBA     PT Long Term Goals - 05/18/19 1007      PT LONG TERM GOAL #1   Title  Pt. will increase FOTO to 64 to improve safety with functional tasks/ mobility.    Baseline  Initial FOTO: 60; Current FOTO: 44    Time  4    Period  Weeks    Status  Not Met    Target Date  06/15/19      PT LONG TERM GOAL #2   Title  Pt. independent with HEP to increase B LE muscle strength to grossly 4+/5 MMT to improve sit to stands/ safety with walking.    Baseline  Current: bilaterally hip felxion (4-/5), quad (5/5); hamstring, hip abd, hip add (4+/5)    Time  4    Period  Weeks    Status  Partially Met    Target Date  06/15/19      PT LONG TERM GOAL #3   Title  Pt. able to stand from 17" height chair without UE assist safely with no LOB to improve safety at home.    Baseline  Extra time/ B UE assist required.    Time  4    Period  Weeks    Status  Achieved     Target Date  05/18/19      PT LONG TERM GOAL #4   Title  Pt. will increase Berg balance test to >50/56 to improve  safety/ decrease fall risk.    Baseline  Berg on evaluation: 47/56.  1/21: 50/56    Time  4    Period  Weeks    Status  Achieved    Target Date  05/18/19      PT LONG TERM GOAL #5   Title  Pt. will ambulate 1 mile with normalized gait pattern and mod. independence safely to promote return to traveling.    Baseline  Gait limited by poor endurance/ balance issues.  Pt. fatigues easily and requires rest breaks.  Pt. enjoys traveling.    Time  4    Period  Weeks    Status  Partially Met    Target Date  06/15/19      Additional Long Term Goals   Additional Long Term Goals  Yes      PT LONG TERM GOAL #6   Title  Pt. will increase Berg balance test to >52/56 to improve safety/ decrease fall risk.    Baseline  Current: 50/56    Time  4    Period  Weeks    Status  On-going    Target Date  06/15/19      PT LONG TERM GOAL #7   Title  Pt. will be able to perform single leg squat with TRX at a chair of 18.5 inches height to increase unilateral LE strength to improve her safety when get on and off curbs and stairs    Baseline  Pt. is able to perform sit to stand without use of her hands at a chair of 17 inches height    Time  4    Period  Weeks    Status  New    Target Date  06/15/19            Plan - 06/08/19 0913    Clinical Impression Statement  Pt. tolerates standing hip flexor/abductor/extensor therex well without increased LBP and in contolled manner. Pt. is able to perform SLS on airex with hands touching bars occasionally and spinal lateral flexion to maintain balance with SBA within //- bars. Pt. is able to perform tandem walk on foam with minimal UE assist and is able to self-correct use ankle stratgery to maintain balance. Pt. has hard time to perform single leg stance with the other feet go over cone but is able to perform it without keeping the feet in the  air throughout the movement and maintain balance with SBA. Pt. requires CGA to perform single leg stance with step up on hard block to maintain balance. Pt. is able to perform stair negotiation without UE assist with step through pattern and with minimal to moderate spinal flexion as compensating movement with SBA.    Examination-Activity Limitations  Stand;Stairs;Squat    Examination-Participation Restrictions  Shop    Stability/Clinical Decision Making  Evolving/Moderate complexity    Clinical Decision Making  Moderate    Rehab Potential  Good    PT Frequency  2x / week    PT Duration  4 weeks    PT Treatment/Interventions  ADLs/Self Care Home Management;Moist Heat;Gait training;Stair training;Functional mobility training;Neuromuscular re-education;Balance training;Therapeutic exercise;Therapeutic activities;Patient/family education;Manual techniques;Passive range of motion;Energy conservation;Electrical Stimulation;Cryotherapy    PT Next Visit Plan  Add slesh push exercise to increase posterior chain strength and increase incline walking efficiency. Increase standing activities to improve LE endurance. Progress single leg balance, TRX single leg squat with airex.    PT Home Exercise Plan  Access Code: [email or web address]    Consulted  and Agree with Plan of Care  Patient       Patient will benefit from skilled therapeutic intervention in order to improve the following deficits and impairments:  Abnormal gait, Improper body mechanics, Pain, Postural dysfunction, Decreased coordination, Decreased activity tolerance, Decreased endurance, Decreased range of motion, Decreased strength, Impaired flexibility, Difficulty walking, Decreased balance  Visit Diagnosis: Muscle weakness (generalized)  Balance problems  Pain in left leg  Pain in right leg  Gait difficulty     Problem List Patient Active Problem List   Diagnosis Date Noted  . History of allergy to radiographic contrast media 06/02/2019  .  History of allergy to shellfish 06/02/2019  . History of Allergy to iodine 06/02/2019    Class: History of  . History of anaphylaxis 06/02/2019  . Acute postoperative pain 06/02/2019  . Other spondylosis, sacral and sacrococcygeal region 04/21/2019  . Abnormal MRI, cervical spine (02/08/2019) 03/25/2019  . Lower extremity weakness (Bilateral) 03/25/2019  . Coordination impairment (lower extremity) 03/25/2019  . Hypomagnesemia 03/24/2019  . Spondylosis without myelopathy or radiculopathy, lumbosacral region 03/24/2019  . Lumbar Facet Hypertrophy 03/24/2019  . Grade 1 Anterolisthesis of L4/5 03/11/2019  . Chronic hip pain (Bilateral) (R>L) 03/11/2019  . Chronic sacroiliac joint pain (Bilateral) 03/11/2019  . Sacroiliac joint somatic dysfunction (Bilateral) 03/11/2019  . Chronic feet pain (Primary Area of Pain) (Bilateral) 03/11/2019  . Chronic leg and foot pain (Bilateral) 03/11/2019  . Diabetic peripheral neuropathy ([place]) 03/11/2019  . Chronic neuropathic pain 03/11/2019  . Neurogenic pain 03/11/2019  . Chronic musculoskeletal pain 03/11/2019  . Osteoarthritis involving multiple joints 03/11/2019  . Chronic pain syndrome 03/10/2019  . Pharmacologic therapy 03/10/2019  . Disorder of skeletal system 03/10/2019  . Problems influencing health status 03/10/2019  . Chronic low back pain (Third area of Pain) (Bilateral) w/o sciatica 03/10/2019  . Chronic lower extremity pain (Secondary area of Pain) (Bilateral) 03/10/2019  . Abnormal MRI, lumbar spine (02/08/2019) 03/10/2019  . Essential hypertension 10/06/2018  . Cholelithiasis 06/23/2018  . Fatty liver 06/23/2018  . Helicobacter pylori infection 05/13/2017  . Mastalgia in female 04/09/2017  . Bilateral leg edema 07/02/2016  . DDD (degenerative disc disease), lumbar 12/16/2014  . Lumbar facet syndrome (Bilateral) 12/16/2014  . Sacroiliac joint dysfunction 12/16/2014  . Neuropathy due to secondary diabetes ([place]) 12/16/2014  . Vitamin  D deficiency 12/13/2014  . Morbid obesity ([place]) 12/10/2014  . Type 2 diabetes mellitus with diabetic neuropathy, unspecified ([place]) 12/10/2014  . Hyperlipidemia 12/10/2014  . Gastroesophageal reflux disease without esophagitis 12/10/2014    Andrey Campanile 06/08/2019, 9:32 AM  [facility] [address] [place], [place], [address] Phone: [phone]   Fax:  [phone]  Name: Beverlyn Mcginness MRN: [id number] Date of Birth: 11/23/1960

## 2019-06-11 ENCOUNTER — Encounter: Payer: BLUE CROSS/BLUE SHIELD | Admitting: Physical Therapy

## 2019-06-12 ENCOUNTER — Ambulatory Visit: Payer: BLUE CROSS/BLUE SHIELD | Admitting: Physical Therapy

## 2019-06-12 ENCOUNTER — Encounter: Payer: Self-pay | Admitting: Physical Therapy

## 2019-06-12 ENCOUNTER — Other Ambulatory Visit: Payer: Self-pay

## 2019-06-12 ENCOUNTER — Ambulatory Visit: Payer: BLUE CROSS/BLUE SHIELD

## 2019-06-12 DIAGNOSIS — M79605 Pain in left leg: Secondary | ICD-10-CM

## 2019-06-12 DIAGNOSIS — R2689 Other abnormalities of gait and mobility: Secondary | ICD-10-CM | POA: Diagnosis not present

## 2019-06-12 DIAGNOSIS — M79604 Pain in right leg: Secondary | ICD-10-CM

## 2019-06-12 DIAGNOSIS — R269 Unspecified abnormalities of gait and mobility: Secondary | ICD-10-CM | POA: Diagnosis not present

## 2019-06-12 DIAGNOSIS — M6281 Muscle weakness (generalized): Secondary | ICD-10-CM | POA: Diagnosis not present

## 2019-06-12 NOTE — Therapy (Signed)
[place] [facility] [address] [place], [place], [address] Phone: [phone]   Fax:  [phone]  Physical Therapy Treatment  Patient Details  Name: Lisa Galvan MRN: [id number] Date of Birth: 05-Jul-1960 Referring Provider (PT): Dr. Dossie Arbour   Encounter Date: 06/12/2019  PT End of Session - 06/12/19 1118    Visit Number  15    Number of Visits  17    Date for PT Re-Evaluation  06/15/19    Authorization - Visit Number  7    Authorization - Number of Visits  10    PT Start Time  0811    PT Stop Time  0901    PT Time Calculation (min)  50 min    Equipment Utilized During Treatment  Gait belt    Activity Tolerance  Patient tolerated treatment well    Behavior During Therapy  [facility] for tasks assessed/performed       Past Medical History:  Diagnosis Date  . Allergy   . Arthritis    spine  . Bilateral leg edema   . Chest pain 12/15/2014  . Chronic sciatica 12/10/2014  . Chronic sciatica 12/10/2014  . Diabetes mellitus without complication ([place])   . Essential hypertension 12/10/2014  . Essential hypertension 12/10/2014  . Gastroesophageal reflux disease without esophagitis   . GERD (gastroesophageal reflux disease)   . Hyperlipidemia   . Hypertension   . Mixed hyperlipidemia   . Obesity (BMI 30.0-34.9) 12/10/2014  . Type 2 diabetes mellitus without complication ([place]) 06/16/8248    Past Surgical History:  Procedure Laterality Date  . ABDOMINAL HYSTERECTOMY    . COLONOSCOPY WITH PROPOFOL N/A 03/30/2019   Procedure: COLONOSCOPY WITH PROPOFOL;  Surgeon: Lin Landsman, MD;  Location: [facility] ENDOSCOPY;  Service: Gastroenterology;  Laterality: N/A;  . ESOPHAGOGASTRODUODENOSCOPY (EGD) WITH PROPOFOL N/A 03/12/2017   Procedure: ESOPHAGOGASTRODUODENOSCOPY (EGD) WITH PROPOFOL;  Surgeon: Lin Landsman, MD;  Location: [facility] ENDOSCOPY;  Service: Gastroenterology;  Laterality: N/A;  . ESOPHAGOGASTRODUODENOSCOPY (EGD) WITH PROPOFOL N/A  03/30/2019   Procedure: ESOPHAGOGASTRODUODENOSCOPY (EGD) WITH PROPOFOL;  Surgeon: Lin Landsman, MD;  Location: [facility] ENDOSCOPY;  Service: Gastroenterology;  Laterality: N/A;  . HERNIA REPAIR    . UMBILICAL HERNIA REPAIR N/A 05/01/2017   Procedure: HERNIA REPAIR UMBILICAL ADULT;  Surgeon: Vickie Epley, MD;  Location: [facility] ORS;  Service: General;  Laterality: N/A;    There were no vitals filed for this visit.  Subjective Assessment - 06/12/19 1109    Subjective  Pt. reports 2/10 soreness in R quadriceps and 2/10 numbness in B legs. Pt reschedules her brain MRI apt and eye apt to March. Pt will receive her second vaccination shot this morning after the PT visit. No falls reported.    Pertinent History  B LE neuropathy starting about 2 years ago.  H/o DM type 2.  Pt. retired from working with mentally ill.  Pt. enjoys traveling but limited by Covid.  Pt. watches TV at this time.  Pt. was going to [place] prior to Covid.  Lyrica has been helping (not taking Gabapentin at this time).  Pt. has h/o numerous falls over past year.  See MRI reports.  Pt. has received 1 lumbar nerve block and receives 2nd tomorrow from Dr. Dossie Arbour.  Pt. reports she is limited with shopping due to prolonged standing/walking.  Pt. lives by herself in 1 story house (has stairs to attic).    Limitations  Lifting;Standing;Walking;House hold activities    How long can  you stand comfortably?  <1 hour (prefers to lean against counter)    How long can you walk comfortably?  <1 mile    Patient Stated Goals  Improve balance/ coordination of feet and legs.  Increase LE muscle strengthening.  Decrease fall risk.    Currently in Pain?  Yes    Pain Score  2     Pain Location  Leg    Pain Orientation  Right;Left    Pain Descriptors / Indicators  Numbness    Pain Type  Chronic pain;Neuropathic pain    Pain Onset  More than a month ago    Pain Frequency  Intermittent    Multiple Pain Sites  Yes    Pain Score  2     Pain Location  Leg    Pain Orientation  Right    Pain Descriptors / Indicators  Sore    Pain Type  Acute pain    Pain Onset  In the past 7 days    Pain Frequency  Intermittent       Therex: Nustep L 5 10 min TRX sit to stand 12 reps, /staggered stance 9 reps x 2 sets on each side at non-handle chair at 17 inches height with SBA, verbal cueing for feet placement and psoture correction Sled push/pull with 30 lbs of weight x 2 laps x 2 sets, verbal cueing for proper techniques  Neuromuscular re-education: Standing single leg stance on airex hold 5-8 s on each side, tandem walk on foam with //-bars, occasionally touching bars to maintain balance, SBA/CGA, 2 laps x 2 sets Walking in clinic/ outside with cuing for posture/ step pattern/ BOS  Discussed HEP     PT Long Term Goals - 05/18/19 1007      PT LONG TERM GOAL #1   Title  Pt. will increase FOTO to 64 to improve safety with functional tasks/ mobility.    Baseline  Initial FOTO: 60; Current FOTO: 44    Time  4    Period  Weeks    Status  Not Met    Target Date  06/15/19      PT LONG TERM GOAL #2   Title  Pt. independent with HEP to increase B LE muscle strength to grossly 4+/5 MMT to improve sit to stands/ safety with walking.    Baseline  Current: bilaterally hip felxion (4-/5), quad (5/5); hamstring, hip abd, hip add (4+/5)    Time  4    Period  Weeks    Status  Partially Met    Target Date  06/15/19      PT LONG TERM GOAL #3   Title  Pt. able to stand from 17" height chair without UE assist safely with no LOB to improve safety at home.    Baseline  Extra time/ B UE assist required.    Time  4    Period  Weeks    Status  Achieved    Target Date  05/18/19      PT LONG TERM GOAL #4   Title  Pt. will increase Berg balance test to >50/56 to improve safety/ decrease fall risk.    Baseline  Berg on evaluation: 47/56.  1/21: 50/56    Time  4    Period  Weeks    Status  Achieved    Target Date  05/18/19      PT LONG  TERM GOAL #5   Title  Pt. will ambulate 1 mile with normalized gait  pattern and mod. independence safely to promote return to traveling.    Baseline  Gait limited by poor endurance/ balance issues.  Pt. fatigues easily and requires rest breaks.  Pt. enjoys traveling.    Time  4    Period  Weeks    Status  Partially Met    Target Date  06/15/19      Additional Long Term Goals   Additional Long Term Goals  Yes      PT LONG TERM GOAL #6   Title  Pt. will increase Berg balance test to >52/56 to improve safety/ decrease fall risk.    Baseline  Current: 50/56    Time  4    Period  Weeks    Status  On-going    Target Date  06/15/19      PT LONG TERM GOAL #7   Title  Pt. will be able to perform single leg squat with TRX at a chair of 18.5 inches height to increase unilateral LE strength to improve her safety when get on and off curbs and stairs    Baseline  Pt. is able to perform sit to stand without use of her hands at a chair of 17 inches height    Time  4    Period  Weeks    Status  New    Target Date  06/15/19            Plan - 06/12/19 1129    Clinical Impression Statement  Pt. is able to perform TRX sit to stand/staggered stance with proper techniques with moderate verbal cueing for posture correction for consecutive 10 reps without rest break in a controlled manner at a chair of 17 inches of height. Pt. is able to perform sled push with 30 lbs of weights with minimal verbal cueing for proper techniques. Pt. occasionally touching bars during single leg stance on airex and tandem walk on foam to maintain balance with moderate verbal cueing to reduce UE assist with SBA/CGA.    Examination-Activity Limitations  Stand;Stairs;Squat    Examination-Participation Restrictions  Shop    Stability/Clinical Decision Making  Evolving/Moderate complexity    Clinical Decision Making  Moderate    Rehab Potential  Good    PT Frequency  2x / week    PT Duration  4 weeks    PT  Treatment/Interventions  ADLs/Self Care Home Management;Moist Heat;Gait training;Stair training;Functional mobility training;Neuromuscular re-education;Balance training;Therapeutic exercise;Therapeutic activities;Patient/family education;Manual techniques;Passive range of motion;Energy conservation;Electrical Stimulation;Cryotherapy    PT Next Visit Plan  Balance, core. Increase standing activities to improve LE endurance. Progress single leg balance. TRX single leg squat on airex.  CHECK GOALS/ SCHEDULE (recert vs. discharge)    PT Home Exercise Plan  Access Code: [email or web address]    Consulted and Agree with Plan of Care  Patient       Patient will benefit from skilled therapeutic intervention in order to improve the following deficits and impairments:  Abnormal gait, Improper body mechanics, Pain, Postural dysfunction, Decreased coordination, Decreased activity tolerance, Decreased endurance, Decreased range of motion, Decreased strength, Impaired flexibility, Difficulty walking, Decreased balance  Visit Diagnosis: Muscle weakness (generalized)  Balance problems  Pain in left leg  Pain in right leg  Gait difficulty     Problem List Patient Active Problem List   Diagnosis Date Noted  . History of allergy to radiographic contrast media 06/02/2019  . History of allergy to shellfish 06/02/2019  . History of Allergy to iodine 06/02/2019    Class:  History of  . History of anaphylaxis 06/02/2019  . Acute postoperative pain 06/02/2019  . Other spondylosis, sacral and sacrococcygeal region 04/21/2019  . Abnormal MRI, cervical spine (02/08/2019) 03/25/2019  . Lower extremity weakness (Bilateral) 03/25/2019  . Coordination impairment (lower extremity) 03/25/2019  . Hypomagnesemia 03/24/2019  . Spondylosis without myelopathy or radiculopathy, lumbosacral region 03/24/2019  . Lumbar Facet Hypertrophy 03/24/2019  . Grade 1 Anterolisthesis of L4/5 03/11/2019  . Chronic hip pain (Bilateral) (R>L)  03/11/2019  . Chronic sacroiliac joint pain (Bilateral) 03/11/2019  . Sacroiliac joint somatic dysfunction (Bilateral) 03/11/2019  . Chronic feet pain (Primary Area of Pain) (Bilateral) 03/11/2019  . Chronic leg and foot pain (Bilateral) 03/11/2019  . Diabetic peripheral neuropathy ([place]) 03/11/2019  . Chronic neuropathic pain 03/11/2019  . Neurogenic pain 03/11/2019  . Chronic musculoskeletal pain 03/11/2019  . Osteoarthritis involving multiple joints 03/11/2019  . Chronic pain syndrome 03/10/2019  . Pharmacologic therapy 03/10/2019  . Disorder of skeletal system 03/10/2019  . Problems influencing health status 03/10/2019  . Chronic low back pain (Third area of Pain) (Bilateral) w/o sciatica 03/10/2019  . Chronic lower extremity pain (Secondary area of Pain) (Bilateral) 03/10/2019  . Abnormal MRI, lumbar spine (02/08/2019) 03/10/2019  . Essential hypertension 10/06/2018  . Cholelithiasis 06/23/2018  . Fatty liver 06/23/2018  . Helicobacter pylori infection 05/13/2017  . Mastalgia in female 04/09/2017  . Bilateral leg edema 07/02/2016  . DDD (degenerative disc disease), lumbar 12/16/2014  . Lumbar facet syndrome (Bilateral) 12/16/2014  . Sacroiliac joint dysfunction 12/16/2014  . Neuropathy due to secondary diabetes ([place]) 12/16/2014  . Vitamin D deficiency 12/13/2014  . Morbid obesity ([place]) 12/10/2014  . Type 2 diabetes mellitus with diabetic neuropathy, unspecified ([place]) 12/10/2014  . Hyperlipidemia 12/10/2014  . Gastroesophageal reflux disease without esophagitis 12/10/2014   Pura Spice, PT, DPT # [place], SPT 06/12/2019, 1:16 PM  [place] [facility] [address] [place], [place], [address] Phone: [phone]   Fax:  [phone]  Name: Lisa Galvan MRN: [id number] Date of Birth: Apr 13, 1961

## 2019-06-15 ENCOUNTER — Other Ambulatory Visit: Payer: Self-pay

## 2019-06-15 ENCOUNTER — Ambulatory Visit: Payer: BLUE CROSS/BLUE SHIELD | Admitting: Physical Therapy

## 2019-06-15 DIAGNOSIS — M6281 Muscle weakness (generalized): Secondary | ICD-10-CM | POA: Diagnosis not present

## 2019-06-15 DIAGNOSIS — R2689 Other abnormalities of gait and mobility: Secondary | ICD-10-CM

## 2019-06-15 DIAGNOSIS — M79604 Pain in right leg: Secondary | ICD-10-CM | POA: Diagnosis not present

## 2019-06-15 DIAGNOSIS — R269 Unspecified abnormalities of gait and mobility: Secondary | ICD-10-CM

## 2019-06-15 DIAGNOSIS — M79605 Pain in left leg: Secondary | ICD-10-CM | POA: Diagnosis not present

## 2019-06-15 NOTE — Therapy (Signed)
[place] [facility] [address] [place], [place], [address] Phone: [phone]   Fax:  [phone]  Physical Therapy Treatment  Patient Details  Name: Lisa Galvan MRN: [id number] Date of Birth: 1960/06/30 Referring Provider (PT): Dr. Dossie Arbour   Encounter Date: 06/15/2019  PT End of Session - 06/15/19 0923    Visit Number  16    Number of Visits  17    Date for PT Re-Evaluation  06/17/19    Authorization - Visit Number  8    Authorization - Number of Visits  10    PT Start Time  0807    PT Stop Time  0904    PT Time Calculation (min)  57 min    Equipment Utilized During Treatment  Gait belt    Activity Tolerance  Patient tolerated treatment well    Behavior During Therapy  [facility] for tasks assessed/performed       Past Medical History:  Diagnosis Date  . Allergy   . Arthritis    spine  . Bilateral leg edema   . Chest pain 12/15/2014  . Chronic sciatica 12/10/2014  . Chronic sciatica 12/10/2014  . Diabetes mellitus without complication ([place])   . Essential hypertension 12/10/2014  . Essential hypertension 12/10/2014  . Gastroesophageal reflux disease without esophagitis   . GERD (gastroesophageal reflux disease)   . Hyperlipidemia   . Hypertension   . Mixed hyperlipidemia   . Obesity (BMI 30.0-34.9) 12/10/2014  . Type 2 diabetes mellitus without complication ([place]) 10/29/6281    Past Surgical History:  Procedure Laterality Date  . ABDOMINAL HYSTERECTOMY    . COLONOSCOPY WITH PROPOFOL N/A 03/30/2019   Procedure: COLONOSCOPY WITH PROPOFOL;  Surgeon: Lin Landsman, MD;  [facility]: [facility] ENDOSCOPY;  Service: Gastroenterology;  Laterality: N/A;  . ESOPHAGOGASTRODUODENOSCOPY (EGD) WITH PROPOFOL N/A 03/12/2017   Procedure: ESOPHAGOGASTRODUODENOSCOPY (EGD) WITH PROPOFOL;  Surgeon: Lin Landsman, MD;  Location: [facility] ENDOSCOPY;  Service: Gastroenterology;  Laterality: N/A;  . ESOPHAGOGASTRODUODENOSCOPY (EGD) WITH PROPOFOL N/A  03/30/2019   Procedure: ESOPHAGOGASTRODUODENOSCOPY (EGD) WITH PROPOFOL;  Surgeon: Lin Landsman, MD;  Location: [facility] ENDOSCOPY;  Service: Gastroenterology;  Laterality: N/A;  . HERNIA REPAIR    . UMBILICAL HERNIA REPAIR N/A 05/01/2017   Procedure: HERNIA REPAIR UMBILICAL ADULT;  Surgeon: Vickie Epley, MD;  Location: [facility] ORS;  Service: General;  Laterality: N/A;    There were no vitals filed for this visit.  Subjective Assessment - 06/15/19 0842    Subjective  Pt. reports 2/10 numbness in bilateral legs and cramping in toes of R and L feet prior to today's session. Pt. reports no symptoms or reaction after the second shot of COVID vaccination. Pt. states she can walk comfortably for 1 mile. No falls reported.    Pertinent History  B LE neuropathy starting about 2 years ago.  H/o DM type 2.  Pt. retired from working with mentally ill.  Pt. enjoys traveling but limited by Covid.  Pt. watches TV at this time.  Pt. was going to [place] prior to Covid.  Lyrica has been helping (not taking Gabapentin at this time).  Pt. has h/o numerous falls over past year.  See MRI reports.  Pt. has received 1 lumbar nerve block and receives 2nd tomorrow from Dr. Dossie Arbour.  Pt. reports she is limited with shopping due to prolonged standing/walking.  Pt. lives by herself in 1 story house (has stairs to attic).    Limitations  Lifting;Standing;Walking;House hold activities  How long can you stand comfortably?  <1 hour (prefers to lean against counter)    How long can you walk comfortably?  1 mile    Patient Stated Goals  Improve balance/ coordination of feet and legs.  Increase LE muscle strengthening.  Decrease fall risk.    Currently in Pain?  Yes    Pain Score  2     Pain Location  Leg    Pain Orientation  Left;Right    Pain Descriptors / Indicators  Numbness    Pain Type  Chronic pain;Neuropathic pain    Pain Onset  More than a month ago    Pain Frequency  Intermittent    Multiple Pain  Sites  Yes   no objective number was given   Pain Location  Foot    Pain Orientation  Right;Left    Pain Descriptors / Indicators  Cramping    Pain Onset  --         Therex: Nustep L 5 11 min TRX sit to stand in staggered stance 10 reps x 1 set on each leg at gray chair, SBA, verbal cueing for posture correction TRX single leg squat with two airex on gray chair 5 reps x 2 sets on each leg, manual assistance from SPT to help with standing up when doing SLS on R leg, CGA Standing hip flexion/abduction/extension with 3 lbs B ankle weights 10 reps on each leg within //-bars with mirror feedback, SBA  Neuromuscular re-education: Single leg stance 10 s on each leg within //-bars SBA/CGA, hand touching bars one time to maintain balance Tandem stance 15 s within //- bars SBA/CGA, no UE assist to maintain balance (Marching on airex 8 reps on each side, forward tandem walk on foam, side walk on tandem within //-bars with mirror feedbac x 2 laps, occasionally touching bars to maintain balance, SBA, verbal cueing for posture correction. Gait training in hallway (change of speed), verbal cueing to increase heel strikes and correct posture x 5 min, SBA/CGA        PT Long Term Goals - 05/18/19 1007      PT LONG TERM GOAL #1   Title  Pt. will increase FOTO to 64 to improve safety with functional tasks/ mobility.    Baseline  Initial FOTO: 60; Current FOTO: 44    Time  4    Period  Weeks    Status  Not Met    Target Date  06/15/19      PT LONG TERM GOAL #2   Title  Pt. independent with HEP to increase B LE muscle strength to grossly 4+/5 MMT to improve sit to stands/ safety with walking.    Baseline  Current: bilaterally hip felxion (4-/5), quad (5/5); hamstring, hip abd, hip add (4+/5)    Time  4    Period  Weeks    Status  Partially Met    Target Date  06/15/19      PT LONG TERM GOAL #3   Title  Pt. able to stand from 17" height chair without UE assist safely with no LOB to improve  safety at home.    Baseline  Extra time/ B UE assist required.    Time  4    Period  Weeks    Status  Achieved    Target Date  05/18/19      PT LONG TERM GOAL #4   Title  Pt. will increase Berg balance test to >50/56 to improve safety/ decrease  fall risk.    Baseline  Berg on evaluation: 47/56.  1/21: 50/56    Time  4    Period  Weeks    Status  Achieved    Target Date  05/18/19      PT LONG TERM GOAL #5   Title  Pt. will ambulate 1 mile with normalized gait pattern and mod. independence safely to promote return to traveling.    Baseline  Gait limited by poor endurance/ balance issues.  Pt. fatigues easily and requires rest breaks.  Pt. enjoys traveling.    Time  4    Period  Weeks    Status  Partially Met    Target Date  06/15/19      Additional Long Term Goals   Additional Long Term Goals  Yes      PT LONG TERM GOAL #6   Title  Pt. will increase Berg balance test to >52/56 to improve safety/ decrease fall risk.    Baseline  Current: 50/56    Time  4    Period  Weeks    Status  On-going    Target Date  06/15/19      PT LONG TERM GOAL #7   Title  Pt. will be able to perform single leg squat with TRX at a chair of 18.5 inches height to increase unilateral LE strength to improve her safety when get on and off curbs and stairs    Baseline  Pt. is able to perform sit to stand without use of her hands at a chair of 17 inches height    Time  4    Period  Weeks    Status  New    Target Date  06/15/19            Plan - 06/15/19 1503    Clinical Impression Statement  Pt. has difficulty to perform TRX single leg squat with one airex on the gray chair but is able to do it with two airex on the chair for 5 reps on each side with more manual assistance on SLS on R than L. Pt. tolerates well with standing hip therex with B ankle weights with mirror feedback within //-bars. Pt. demonstrates improved static balance during single leg stance and tandem stance by only touching bar 1-2  times for UE asssit within //-bars with SBA. Pt. occassionally touching bars during tandem walk and side walk on foam to maintain balance. Pt. LOB in backward direction during gait training in hallway but is able to self-correct via ankle strategry. Pt. requires verbal cueing to increase heel strike and correct posture during gait training in hallway.    Examination-Activity Limitations  Stand;Stairs;Squat    Examination-Participation Restrictions  Shop    Stability/Clinical Decision Making  Evolving/Moderate complexity    Clinical Decision Making  Moderate    Rehab Potential  Good    PT Frequency  2x / week    PT Duration  4 weeks    PT Treatment/Interventions  ADLs/Self Care Home Management;Moist Heat;Gait training;Stair training;Functional mobility training;Neuromuscular re-education;Balance training;Therapeutic exercise;Therapeutic activities;Patient/family education;Manual techniques;Passive range of motion;Energy conservation;Electrical Stimulation;Cryotherapy    PT Next Visit Plan  Go over core thereX and add balance exercise into HEP.  CHECK GOALS/ SCHEDULE (recert vs. discharge)    PT Home Exercise Plan  Access Code: [email or web address]    Consulted and Agree with Plan of Care  Patient       Patient will benefit from skilled therapeutic intervention in order to improve  the following deficits and impairments:  Abnormal gait, Improper body mechanics, Pain, Postural dysfunction, Decreased coordination, Decreased activity tolerance, Decreased endurance, Decreased range of motion, Decreased strength, Impaired flexibility, Difficulty walking, Decreased balance  Visit Diagnosis: Muscle weakness (generalized)  Balance problems  Pain in left leg  Pain in right leg  Gait difficulty     Problem List Patient Active Problem List   Diagnosis Date Noted  . History of allergy to radiographic contrast media 06/02/2019  . History of allergy to shellfish 06/02/2019  . History of Allergy to iodine  06/02/2019    Class: History of  . History of anaphylaxis 06/02/2019  . Acute postoperative pain 06/02/2019  . Other spondylosis, sacral and sacrococcygeal region 04/21/2019  . Abnormal MRI, cervical spine (02/08/2019) 03/25/2019  . Lower extremity weakness (Bilateral) 03/25/2019  . Coordination impairment (lower extremity) 03/25/2019  . Hypomagnesemia 03/24/2019  . Spondylosis without myelopathy or radiculopathy, lumbosacral region 03/24/2019  . Lumbar Facet Hypertrophy 03/24/2019  . Grade 1 Anterolisthesis of L4/5 03/11/2019  . Chronic hip pain (Bilateral) (R>L) 03/11/2019  . Chronic sacroiliac joint pain (Bilateral) 03/11/2019  . Sacroiliac joint somatic dysfunction (Bilateral) 03/11/2019  . Chronic feet pain (Primary Area of Pain) (Bilateral) 03/11/2019  . Chronic leg and foot pain (Bilateral) 03/11/2019  . Diabetic peripheral neuropathy ([place]) 03/11/2019  . Chronic neuropathic pain 03/11/2019  . Neurogenic pain 03/11/2019  . Chronic musculoskeletal pain 03/11/2019  . Osteoarthritis involving multiple joints 03/11/2019  . Chronic pain syndrome 03/10/2019  . Pharmacologic therapy 03/10/2019  . Disorder of skeletal system 03/10/2019  . Problems influencing health status 03/10/2019  . Chronic low back pain (Third area of Pain) (Bilateral) w/o sciatica 03/10/2019  . Chronic lower extremity pain (Secondary area of Pain) (Bilateral) 03/10/2019  . Abnormal MRI, lumbar spine (02/08/2019) 03/10/2019  . Essential hypertension 10/06/2018  . Cholelithiasis 06/23/2018  . Fatty liver 06/23/2018  . Helicobacter pylori infection 05/13/2017  . Mastalgia in female 04/09/2017  . Bilateral leg edema 07/02/2016  . DDD (degenerative disc disease), lumbar 12/16/2014  . Lumbar facet syndrome (Bilateral) 12/16/2014  . Sacroiliac joint dysfunction 12/16/2014  . Neuropathy due to secondary diabetes ([place]) 12/16/2014  . Vitamin D deficiency 12/13/2014  . Morbid obesity ([place]) 12/10/2014  . Type 2  diabetes mellitus with diabetic neuropathy, unspecified ([place]) 12/10/2014  . Hyperlipidemia 12/10/2014  . Gastroesophageal reflux disease without esophagitis 12/10/2014   Pura Spice, PT, DPT # [place], SPT 06/15/2019, 3:22 PM  [place] [facility] [address] [place], [place], [address] Phone: [phone]   Fax:  [phone]  Name: Lisa Galvan MRN: [id number] Date of Birth: [birth year]

## 2019-06-16 ENCOUNTER — Encounter: Payer: Self-pay | Admitting: Physical Therapy

## 2019-06-17 ENCOUNTER — Other Ambulatory Visit: Payer: Self-pay

## 2019-06-17 ENCOUNTER — Ambulatory Visit: Payer: BLUE CROSS/BLUE SHIELD | Admitting: Physical Therapy

## 2019-06-17 DIAGNOSIS — M79605 Pain in left leg: Secondary | ICD-10-CM

## 2019-06-17 DIAGNOSIS — M6281 Muscle weakness (generalized): Secondary | ICD-10-CM | POA: Diagnosis not present

## 2019-06-17 DIAGNOSIS — R269 Unspecified abnormalities of gait and mobility: Secondary | ICD-10-CM

## 2019-06-17 DIAGNOSIS — M79604 Pain in right leg: Secondary | ICD-10-CM

## 2019-06-17 DIAGNOSIS — R2689 Other abnormalities of gait and mobility: Secondary | ICD-10-CM

## 2019-06-17 NOTE — Patient Instructions (Signed)
Access Code: [email or web address]  URL: [email or web address]  Date: 06/17/2019  Prepared by: Dorene Grebe   Exercises  Straight Leg Raise - 20 reps - 1 sets - 1x daily - 4x weekly  Sit to Stand - 10 reps - 2 sets - 1x daily - 7x weekly  Supine Bridge with Gluteal Set and Spinal Articulation - 10 reps - 2 sets - 1x daily - 4x weekly  Side Stepping with Resistance at Thighs and Counter Support - 16 reps - 2 sets - 1x daily - 4x weekly  Forward Monster Walk with Resistance at Ankles and Counter Support - 16 reps - 2 sets - 1x daily - 4x weekly  Standing Single Leg Stance with Unilateral Counter Support - 5 reps - 3 sets - 30 s hold - 1x daily - 7x weekly  Standing Tandem Balance with Unilateral Counter Support - 5 reps - 3 sets - 30 s hold - 1x daily - 7x weekly  Seated March with Resistance - 10 reps - 3 sets - 5 s hold - 1x daily - 7x weekly  Supine Hamstring Stretch with Strap - 10 reps - 3 sets - 60 s hold - 1x daily - 7x weekly  Standing Gastroc Stretch at Counter - 1 reps - 3 sets - 60 s hold - 1x daily - 7x weekly

## 2019-06-17 NOTE — Therapy (Signed)
[facility] [address] [place], [place], [address] Phone: [phone]   Fax:  [phone]  Physical Therapy Treatment  Patient Details  Name: Lisa Galvan MRN: [id number] Date of Birth: 03-Jan-1961 Referring Provider (PT): Dr. Dossie Arbour   Encounter Date: 06/17/2019  PT End of Session - 06/17/19 0922    Visit Number  17    Number of Visits  17    Date for PT Re-Evaluation  06/17/19    Authorization - Visit Number  9    Authorization - Number of Visits  10    PT Start Time  0807    PT Stop Time  0903    PT Time Calculation (min)  56 min    Equipment Utilized During Treatment  Gait belt    Activity Tolerance  Patient tolerated treatment well    Behavior During Therapy  [facility] for tasks assessed/performed       Past Medical History:  Diagnosis Date  . Allergy   . Arthritis    spine  . Bilateral leg edema   . Chest pain 12/15/2014  . Chronic sciatica 12/10/2014  . Chronic sciatica 12/10/2014  . Diabetes mellitus without complication ([place])   . Essential hypertension 12/10/2014  . Essential hypertension 12/10/2014  . Gastroesophageal reflux disease without esophagitis   . GERD (gastroesophageal reflux disease)   . Hyperlipidemia   . Hypertension   . Mixed hyperlipidemia   . Obesity (BMI 30.0-34.9) 12/10/2014  . Type 2 diabetes mellitus without complication ([place]) 06/11/7586    Past Surgical History:  Procedure Laterality Date  . ABDOMINAL HYSTERECTOMY    . COLONOSCOPY WITH PROPOFOL N/A 03/30/2019   Procedure: COLONOSCOPY WITH PROPOFOL;  Surgeon: Lin Landsman, MD;  Location: [facility] ENDOSCOPY;  Service: Gastroenterology;  Laterality: N/A;  . ESOPHAGOGASTRODUODENOSCOPY (EGD) WITH PROPOFOL N/A 03/12/2017   Procedure: ESOPHAGOGASTRODUODENOSCOPY (EGD) WITH PROPOFOL;  Surgeon: Lin Landsman, MD;  Location: [facility] ENDOSCOPY;  Service: Gastroenterology;  Laterality: N/A;  . ESOPHAGOGASTRODUODENOSCOPY (EGD) WITH PROPOFOL N/A  03/30/2019   Procedure: ESOPHAGOGASTRODUODENOSCOPY (EGD) WITH PROPOFOL;  Surgeon: Lin Landsman, MD;  Location: [facility] ENDOSCOPY;  Service: Gastroenterology;  Laterality: N/A;  . HERNIA REPAIR    . UMBILICAL HERNIA REPAIR N/A 05/01/2017   Procedure: HERNIA REPAIR UMBILICAL ADULT;  Surgeon: Vickie Epley, MD;  Location: [facility] ORS;  Service: General;  Laterality: N/A;    There were no vitals filed for this visit.  Subjective Assessment - 06/17/19 0914    Subjective  Pt. reports 2/10 numbness in bilateral legs prior to today's session. Pt. states she agrees to be discharged today and feels comfortable of doing HEP on her own. No falls reported.    Pertinent History  B LE neuropathy starting about 2 years ago.  H/o DM type 2.  Pt. retired from working with mentally ill.  Pt. enjoys traveling but limited by Covid.  Pt. watches TV at this time.  Pt. was going to [place] prior to Covid.  Lyrica has been helping (not taking Gabapentin at this time).  Pt. has h/o numerous falls over past year.  See MRI reports.  Pt. has received 1 lumbar nerve block and receives 2nd tomorrow from Dr. Dossie Arbour.  Pt. reports she is limited with shopping due to prolonged standing/walking.  Pt. lives by herself in 1 story house (has stairs to attic).    Limitations  Lifting;Standing;Walking;House hold activities    How long can you stand comfortably?  <1 hour (prefers to lean  against counter)    How long can you walk comfortably?  1 mile    Patient Stated Goals  Improve balance/ coordination of feet and legs.  Increase LE muscle strengthening.  Decrease fall risk.    Currently in Pain?  Yes    Pain Score  2     Pain Location  Leg    Pain Orientation  Left;Right    Pain Descriptors / Indicators  Numbness    Pain Type  Chronic pain;Neuropathic pain    Pain Onset  More than a month ago    Pain Frequency  Intermittent    Multiple Pain Sites  No        Therex: Nustep L 5 10 min Supine SLR 10 reps on  each side Supine bridges 10 reps, 3 s hold  Sit to stand 8 reps  HEP (see handout)  MMT:  Hip flex.           R (4/5)   L (4/5) Hip ABD          R (4+/5)   L (4+/5) Hip ADD          R (4+/5)   L (4+/5)  Knee ext.        R (5/5)   L (5/5) Knee flex.       R (4+/5)   L (4+/5)  DF                  R (4/5)   L (4/5)  Berg: 52/56  FOTO: 63      PT Education - 06/17/19 0922    Education Details  Discussed HEP    Person(s) Educated  Patient    Methods  Explanation;Demonstration;Handout    Comprehension  Verbalized understanding;Returned demonstration          PT Long Term Goals - 06/17/19 0945      PT LONG TERM GOAL #1   Title  Pt. will increase FOTO to 64 to improve safety with functional tasks/ mobility.    Baseline  Initial FOTO: 60; 1/21 FOTO: 44. 2/24: 63    Time  4    Period  Weeks    Status  Achieved    Target Date  06/17/19      PT LONG TERM GOAL #2   Title  Pt. independent with HEP to increase B LE muscle strength to grossly 4+/5 MMT to improve sit to stands/ safety with walking.    Baseline  1/21: bilaterally hip flexion (4-/5), quad (5/5); hamstring, hip abd, hip add (4+/5). 2/24: Hip flex. R (4/5)   L (4/5), Hip ABD R (4+/5)   L (4+/5). Hip ADD R (4+/5)   L (4+/5). Knee ext. R (5/5)   L (5/5). Knee flex. R (5/5)   L (5/5). DF R (4/5)   L (4/5).    Time  4    Period  Weeks    Status  Partially Met    Target Date  06/17/19      PT LONG TERM GOAL #3   Title  Pt. able to stand from 17" height chair without UE assist safely with no LOB to improve safety at home.    Baseline  Extra time/ B UE assist required.    Time  4    Period  Weeks    Status  Achieved    Target Date  05/18/19      PT LONG TERM GOAL #4   Title  Pt. will increase  Berg balance test to >50/56 to improve safety/ decrease fall risk.    Baseline  Berg on evaluation: 47/56.  1/21: 50/56. 2/24: 52/56    Time  4    Period  Weeks    Status  Achieved      PT LONG TERM GOAL #5   Title  Pt.  will ambulate 1 mile with normalized gait pattern and mod. independence safely to promote return to traveling.    Baseline  Gait limited by poor endurance/ balance issues.  Pt. fatigues easily and requires rest breaks.  Pt. enjoys traveling.    Time  4    Period  Weeks    Status  Achieved    Target Date  06/17/19      PT LONG TERM GOAL #6   Title  Pt. will increase Berg balance test to >52/56 to improve safety/ decrease fall risk.    Baseline  Current: 50/56. 2/24: 52/56    Time  4    Period  Weeks    Status  Achieved    Target Date  06/17/19      PT LONG TERM GOAL #7   Title  Pt. will be able to perform single leg squat with TRX at a chair of 18.5 inches height to increase unilateral LE strength to improve her safety when get on and off curbs and stairs    Baseline  Pt. is able to perform sit to stand without use of her hands at a chair of 17 inches height. Pt. is able to perform single leg squat at a 18.5 inches height chair with two airex on it for 4 consecutive reps.    Time  4    Period  Weeks    Status  Partially Met    Target Date  06/17/19            Plan - 06/17/19 0931    Clinical Impression Statement  Pt. demonstrates slightly improved strength in MMT hip flexion (R and L:4/5, previous: 4-/5); B hip abd and add, knee ext and flex and DF stays the same compared to previous time (B hip abd, add (4+/5), knee ext (5/5) and flex (4+/5), DF 4/5). Pt. demonstrates slightly improvement in balance Merrilee Jansky: 52/56, previous: 50/56). FOTO score significant increases from previous time (44) to today (63). Pt. tolerates well with supine bridges/SLR and sit to stand therex. Pt. is discharged today and is advised to stay active and do the updated HEP on her own.    Examination-Activity Limitations  Stand;Stairs;Squat    Examination-Participation Restrictions  Shop    Stability/Clinical Decision Making  Evolving/Moderate complexity    Clinical Decision Making  Moderate    Rehab Potential   Good    PT Frequency  2x / week    PT Duration  4 weeks    PT Treatment/Interventions  ADLs/Self Care Home Management;Moist Heat;Gait training;Stair training;Functional mobility training;Neuromuscular re-education;Balance training;Therapeutic exercise;Therapeutic activities;Patient/family education;Manual techniques;Passive range of motion;Energy conservation;Electrical Stimulation;Cryotherapy    PT Next Visit Plan  DISCHARGE.   Pt. instructed to contact PT if any concerns/ regression.    PT Home Exercise Plan  Access Code: [email or web address]    Consulted and Agree with Plan of Care  Patient       Patient will benefit from skilled therapeutic intervention in order to improve the following deficits and impairments:  Abnormal gait, Improper body mechanics, Pain, Postural dysfunction, Decreased coordination, Decreased activity tolerance, Decreased endurance, Decreased range of motion, Decreased strength, Impaired flexibility, Difficulty walking,  Decreased balance  Visit Diagnosis: Muscle weakness (generalized)  Balance problems  Pain in left leg  Pain in right leg  Gait difficulty     Problem List Patient Active Problem List   Diagnosis Date Noted  . History of allergy to radiographic contrast media 06/02/2019  . History of allergy to shellfish 06/02/2019  . History of Allergy to iodine 06/02/2019    Class: History of  . History of anaphylaxis 06/02/2019  . Acute postoperative pain 06/02/2019  . Other spondylosis, sacral and sacrococcygeal region 04/21/2019  . Abnormal MRI, cervical spine (02/08/2019) 03/25/2019  . Lower extremity weakness (Bilateral) 03/25/2019  . Coordination impairment (lower extremity) 03/25/2019  . Hypomagnesemia 03/24/2019  . Spondylosis without myelopathy or radiculopathy, lumbosacral region 03/24/2019  . Lumbar Facet Hypertrophy 03/24/2019  . Grade 1 Anterolisthesis of L4/5 03/11/2019  . Chronic hip pain (Bilateral) (R>L) 03/11/2019  . Chronic sacroiliac joint  pain (Bilateral) 03/11/2019  . Sacroiliac joint somatic dysfunction (Bilateral) 03/11/2019  . Chronic feet pain (Primary Area of Pain) (Bilateral) 03/11/2019  . Chronic leg and foot pain (Bilateral) 03/11/2019  . Diabetic peripheral neuropathy ([place]) 03/11/2019  . Chronic neuropathic pain 03/11/2019  . Neurogenic pain 03/11/2019  . Chronic musculoskeletal pain 03/11/2019  . Osteoarthritis involving multiple joints 03/11/2019  . Chronic pain syndrome 03/10/2019  . Pharmacologic therapy 03/10/2019  . Disorder of skeletal system 03/10/2019  . Problems influencing health status 03/10/2019  . Chronic low back pain (Third area of Pain) (Bilateral) w/o sciatica 03/10/2019  . Chronic lower extremity pain (Secondary area of Pain) (Bilateral) 03/10/2019  . Abnormal MRI, lumbar spine (02/08/2019) 03/10/2019  . Essential hypertension 10/06/2018  . Cholelithiasis 06/23/2018  . Fatty liver 06/23/2018  . Helicobacter pylori infection 05/13/2017  . Mastalgia in female 04/09/2017  . Bilateral leg edema 07/02/2016  . DDD (degenerative disc disease), lumbar 12/16/2014  . Lumbar facet syndrome (Bilateral) 12/16/2014  . Sacroiliac joint dysfunction 12/16/2014  . Neuropathy due to secondary diabetes ([place]) 12/16/2014  . Vitamin D deficiency 12/13/2014  . Morbid obesity ([place]) 12/10/2014  . Type 2 diabetes mellitus with diabetic neuropathy, unspecified ([place]) 12/10/2014  . Hyperlipidemia 12/10/2014  . Gastroesophageal reflux disease without esophagitis 12/10/2014   Pura Spice, PT, DPT # [place], SPT 06/18/2019, 1:11 PM  [facility] [address] [place], [place], [address] Phone: [phone]   Fax:  [phone]  Name: Lisa Galvan MRN: [id number] Date of Birth: 1960-06-07

## 2019-06-18 ENCOUNTER — Encounter: Payer: Self-pay | Admitting: Physical Therapy

## 2019-06-22 ENCOUNTER — Ambulatory Visit
Admission: RE | Admit: 2019-06-22 | Discharge: 2019-06-22 | Disposition: A | Discharge status: Home / Self Care | Payer: BLUE CROSS/BLUE SHIELD | Source: Ambulatory Visit | Attending: Neurology | Admitting: Neurology

## 2019-06-22 ENCOUNTER — Other Ambulatory Visit: Payer: Self-pay

## 2019-06-22 ENCOUNTER — Other Ambulatory Visit: Payer: Self-pay | Admitting: Pain Medicine

## 2019-06-22 DIAGNOSIS — G219 Secondary parkinsonism, unspecified: Secondary | ICD-10-CM | POA: Diagnosis not present

## 2019-06-22 DIAGNOSIS — R4781 Slurred speech: Secondary | ICD-10-CM | POA: Diagnosis not present

## 2019-06-22 DIAGNOSIS — M47816 Spondylosis without myelopathy or radiculopathy, lumbar region: Secondary | ICD-10-CM

## 2019-06-25 ENCOUNTER — Ambulatory Visit
Admission: RE | Admit: 2019-06-25 | Discharge: 2019-06-25 | Disposition: A | Discharge status: Home / Self Care | Payer: BLUE CROSS/BLUE SHIELD | Source: Ambulatory Visit | Attending: Family Medicine | Admitting: Family Medicine

## 2019-06-25 DIAGNOSIS — Z1231 Encounter for screening mammogram for malignant neoplasm of breast: Secondary | ICD-10-CM

## 2019-06-30 ENCOUNTER — Ambulatory Visit
Admission: RE | Admit: 2019-06-30 | Discharge: 2019-06-30 | Disposition: A | Discharge status: Home / Self Care | Payer: BLUE CROSS/BLUE SHIELD | Source: Ambulatory Visit | Attending: Pain Medicine | Admitting: Pain Medicine

## 2019-06-30 ENCOUNTER — Other Ambulatory Visit: Payer: Self-pay

## 2019-06-30 ENCOUNTER — Ambulatory Visit (HOSPITAL_BASED_OUTPATIENT_CLINIC_OR_DEPARTMENT_OTHER): Payer: BLUE CROSS/BLUE SHIELD | Admitting: Pain Medicine

## 2019-06-30 ENCOUNTER — Encounter: Payer: Self-pay | Admitting: Pain Medicine

## 2019-06-30 VITALS — BP 115/73 | HR 106 | Temp 98.6°F | Resp 30 | Ht 63.0 in | Wt 183.0 lb

## 2019-06-30 DIAGNOSIS — G8918 Other acute postprocedural pain: Secondary | ICD-10-CM | POA: Insufficient documentation

## 2019-06-30 DIAGNOSIS — M5136 Other intervertebral disc degeneration, lumbar region: Secondary | ICD-10-CM | POA: Insufficient documentation

## 2019-06-30 DIAGNOSIS — M792 Neuralgia and neuritis, unspecified: Secondary | ICD-10-CM | POA: Insufficient documentation

## 2019-06-30 DIAGNOSIS — M47816 Spondylosis without myelopathy or radiculopathy, lumbar region: Secondary | ICD-10-CM | POA: Insufficient documentation

## 2019-06-30 DIAGNOSIS — M4310 Spondylolisthesis, site unspecified: Secondary | ICD-10-CM | POA: Insufficient documentation

## 2019-06-30 DIAGNOSIS — G8929 Other chronic pain: Secondary | ICD-10-CM | POA: Diagnosis not present

## 2019-06-30 DIAGNOSIS — Z888 Allergy status to other drugs, medicaments and biological substances status: Secondary | ICD-10-CM | POA: Diagnosis not present

## 2019-06-30 DIAGNOSIS — M47817 Spondylosis without myelopathy or radiculopathy, lumbosacral region: Secondary | ICD-10-CM

## 2019-06-30 DIAGNOSIS — Z91041 Radiographic dye allergy status: Secondary | ICD-10-CM

## 2019-06-30 DIAGNOSIS — M545 Low back pain: Secondary | ICD-10-CM

## 2019-06-30 DIAGNOSIS — Z91013 Allergy to seafood: Secondary | ICD-10-CM | POA: Diagnosis not present

## 2019-06-30 DIAGNOSIS — Z87892 Personal history of anaphylaxis: Secondary | ICD-10-CM

## 2019-06-30 DIAGNOSIS — E1142 Type 2 diabetes mellitus with diabetic polyneuropathy: Secondary | ICD-10-CM | POA: Insufficient documentation

## 2019-06-30 MED ORDER — ROPIVACAINE HCL 2 MG/ML IJ SOLN
9.0000 mL | Freq: Once | INTRAMUSCULAR | Status: AC
  Administered 2019-06-30: 9 mL via PERINEURAL
  Filled 2019-06-30: qty 10

## 2019-06-30 MED ORDER — TRIAMCINOLONE ACETONIDE 40 MG/ML IJ SUSP
40.0000 mg | Freq: Once | INTRAMUSCULAR | Status: AC
  Administered 2019-06-30: 40 mg
  Filled 2019-06-30: qty 1

## 2019-06-30 MED ORDER — PREGABALIN 75 MG PO CAPS: ORAL_CAPSULE | ORAL | 0 refills | Status: DC

## 2019-06-30 MED ORDER — HYDROCODONE-ACETAMINOPHEN 5-325 MG PO TABS: 1.0000 | ORAL_TABLET | Freq: Four times a day (QID) | ORAL | 0 refills | Status: AC | PRN

## 2019-06-30 MED ORDER — MIDAZOLAM HCL 5 MG/5ML IJ SOLN
1.0000 mg | INTRAMUSCULAR | Status: DC | PRN
  Administered 2019-06-30: 1.5 mg via INTRAVENOUS
  Filled 2019-06-30: qty 5

## 2019-06-30 MED ORDER — LIDOCAINE HCL 2 % IJ SOLN
20.0000 mL | Freq: Once | INTRAMUSCULAR | Status: AC
  Administered 2019-06-30: 400 mg
  Filled 2019-06-30: qty 20

## 2019-06-30 MED ORDER — LACTATED RINGERS IV SOLN
1000.0000 mL | Freq: Once | INTRAVENOUS | Status: AC
  Administered 2019-06-30: 1000 mL via INTRAVENOUS

## 2019-06-30 MED ORDER — PREGABALIN 100 MG PO CAPS: ORAL_CAPSULE | ORAL | 0 refills | Status: DC

## 2019-06-30 MED ORDER — FENTANYL CITRATE (PF) 100 MCG/2ML IJ SOLN
25.0000 ug | INTRAMUSCULAR | Status: DC | PRN
  Administered 2019-06-30: 75 ug via INTRAVENOUS
  Filled 2019-06-30: qty 2

## 2019-06-30 NOTE — Progress Notes (Signed)
PROVIDER NOTE: Information contained herein reflects review and annotations entered in association with encounter. Interpretation of such information and data should be left to medically-trained personnel. Information provided to patient can be located elsewhere in the medical record under "Patient Instructions". Document created using STT-dictation technology, any transcriptional errors that may result from process are unintentional.    Patient: Lisa Galvan  Service Category: Procedure  Provider: Oswaldo Done, MD  DOB: [birth year]  DOS: 06/30/2019  Location: [facility]  MRN: [id number]  Setting: Ambulatory - outpatient  Referring Provider: Delano Metz, MD  Type: Established Patient  Specialty: Interventional Pain Management  PCP: Dorcas Carrow, DO   Primary Reason for Visit: Interventional Pain Management Treatment. CC: Back Pain (low and left)  Procedure:          Anesthesia, Analgesia, Anxiolysis:  Type: Thermal Lumbar Facet, Medial Branch Radiofrequency Ablation/Neurotomy  #1  Primary Purpose: Therapeutic Region: Posterolateral Lumbosacral Spine Level: L2, L3, L4, L5, & S1 Medial Branch Level(s). These levels will denervate the L3-4, L4-5, and the L5-S1 lumbar facet joints. Laterality: Left  Type: Moderate (Conscious) Sedation combined with Local Anesthesia Indication(s): Analgesia and Anxiety Route: Intravenous (IV) IV Access: Secured Sedation: Meaningful verbal contact was maintained at all times during the procedure  Local Anesthetic: Lidocaine 1-2%  Position: Prone   Indications: 1. Spondylosis without myelopathy or radiculopathy, lumbosacral region   2. Lumbar facet syndrome (Bilateral)   3. Lumbar Facet Hypertrophy   4. Grade 1 Anterolisthesis of L4/5   5. DDD (degenerative disc disease), lumbar   6. Chronic low back pain (Third area of Pain) (Bilateral) w/o sciatica   7. History of Allergy to iodine   8. History of allergy to radiographic  contrast media   9. History of allergy to shellfish   10. History of anaphylaxis    Lisa Galvan has been dealing with the above chronic pain for longer than three months and has either failed to respond, was unable to tolerate, or simply did not get enough benefit from other more conservative therapies including, but not limited to: 1. Over-the-counter medications 2. Anti-inflammatory medications 3. Muscle relaxants 4. Membrane stabilizers 5. Opioids 6. Physical therapy and/or chiropractic manipulation 7. Modalities (Heat, ice, etc.) 8. Invasive techniques such as nerve blocks. Lisa Galvan has attained more than 50% relief of the pain from a series of diagnostic injections conducted in separate occasions.  Pain Score: Pre-procedure: 3 /10 Post-procedure: 0-No pain/10  Post-Procedure Evaluation  Procedure: Therapeutic right-sided lumbar facet RFA #1 under fluoroscopic guidance and IV sedation Pre-procedure pain level: 3/10 Post-procedure: 0/10 (100% relief)  Sedation: Sedation provided.  Effectiveness during initial hour after procedure(Ultra-Short Term Relief): 100 %  Local anesthetic used: Long-acting (4-6 hours) Effectiveness: Defined as any analgesic benefit obtained secondary to the administration of local anesthetics. This carries significant diagnostic value as to the etiological location, or anatomical origin, of the pain. Duration of benefit is expected to coincide with the duration of the local anesthetic used.  Effectiveness during initial 4-6 hours after procedure(Short-Term Relief): 100 %  Long-term benefit: Defined as any relief past the pharmacologic duration of the local anesthetics.  Effectiveness past the initial 6 hours after procedure(Long-Term Relief): 80 %  Current benefits: Defined as benefit that persist at this time.   Analgesia:  80% improved Function: Lisa Galvan reports improvement in function ROM: Lisa Galvan reports improvement in ROM  Pre-op Assessment:  Lisa Galvan is a 59 y.o. (year old), female patient, seen today for interventional  treatment. She  has a past surgical history that includes Abdominal hysterectomy; Esophagogastroduodenoscopy (egd) with propofol (N/A, 03/12/2017); Umbilical hernia repair (N/A, 05/01/2017); Hernia repair; Esophagogastroduodenoscopy (egd) with propofol (N/A, 03/30/2019); and Colonoscopy with propofol (N/A, 03/30/2019). Lisa Galvan has a current medication list which includes the following prescription(s): amlodipine, atorvastatin, vitamin d-3, duloxetine, jardiance, epinephrine, fexofenadine, hydrochlorothiazide, ibuprofen, ketoconazole, magnesium oxide, metformin, montelukast, pioglitazone, [START ON 07/29/2019] pregabalin, pregabalin, valacyclovir, hydrocodone-acetaminophen, [START ON 07/07/2019] hydrocodone-acetaminophen, and omeprazole, and the following Facility-Administered Medications: fentanyl and midazolam. Her primarily concern today is the Back Pain (low and left)  Initial Vital Signs:  Pulse/HCG Rate: (!) 106ECG Heart Rate: 99 Temp: 98.6 F (37 C) Resp: 18 BP: 119/84 SpO2: 96 %  BMI: Estimated body mass index is 32.42 kg/m as calculated from the following:   Height as of this encounter: 5\' 3"  (1.6 m).   Weight as of this encounter: 183 lb (83 kg).  Risk Assessment: Allergies: Reviewed. She is allergic to amlodipine besy-benazepril hcl; ivp dye [iodinated diagnostic agents]; shellfish allergy; shellfish-derived products; and ace inhibitors.  Allergy Precautions: None required Coagulopathies: Reviewed. None identified.  Blood-thinner therapy: None at this time Active Infection(s): Reviewed. None identified. Lisa Galvan is afebrile  Site Confirmation: Lisa Galvan was asked to confirm the procedure and laterality before marking the site Procedure checklist: Completed Consent: Before the procedure and under the influence of no sedative(s), amnesic(s), or anxiolytics, the patient was informed of the treatment options, risks and  possible complications. To fulfill our ethical and legal obligations, as recommended by the American Medical Association's Code of Ethics, I have informed the patient of my clinical impression; the nature and purpose of the treatment or procedure; the risks, benefits, and possible complications of the intervention; the alternatives, including doing nothing; the risk(s) and benefit(s) of the alternative treatment(s) or procedure(s); and the risk(s) and benefit(s) of doing nothing. The patient was provided information about the general risks and possible complications associated with the procedure. These may include, but are not limited to: failure to achieve desired goals, infection, bleeding, organ or nerve damage, allergic reactions, paralysis, and death. In addition, the patient was informed of those risks and complications associated to Spine-related procedures, such as failure to decrease pain; infection (i.e.: Meningitis, epidural or intraspinal abscess); bleeding (i.e.: epidural hematoma, subarachnoid hemorrhage, or any other type of intraspinal or peri-dural bleeding); organ or nerve damage (i.e.: Any type of peripheral nerve, nerve root, or spinal cord injury) with subsequent damage to sensory, motor, and/or autonomic systems, resulting in permanent pain, numbness, and/or weakness of one or several areas of the body; allergic reactions; (i.e.: anaphylactic reaction); and/or death. Furthermore, the patient was informed of those risks and complications associated with the medications. These include, but are not limited to: allergic reactions (i.e.: anaphylactic or anaphylactoid reaction(s)); adrenal axis suppression; blood sugar elevation that in diabetics may result in ketoacidosis or comma; water retention that in patients with history of congestive heart failure may result in shortness of breath, pulmonary edema, and decompensation with resultant heart failure; weight gain; swelling or edema;  medication-induced neural toxicity; particulate matter embolism and blood vessel occlusion with resultant organ, and/or nervous system infarction; and/or aseptic necrosis of one or more joints. Finally, the patient was informed that Medicine is not an exact science; therefore, there is also the possibility of unforeseen or unpredictable risks and/or possible complications that may result in a catastrophic outcome. The patient indicated having understood very clearly. We have given the patient no guarantees and we have made  no promises. Enough time was given to the patient to ask questions, all of which were answered to the patient's satisfaction. Lisa Galvan has indicated that she wanted to continue with the procedure. Attestation: I, the ordering provider, attest that I have discussed with the patient the benefits, risks, side-effects, alternatives, likelihood of achieving goals, and potential problems during recovery for the procedure that I have provided informed consent. Date  Time: 06/30/2019 12:17 PM  Pre-Procedure Preparation:  Monitoring: As per clinic protocol. Respiration, ETCO2, SpO2, BP, heart rate and rhythm monitor placed and checked for adequate function Safety Precautions: Patient was assessed for positional comfort and pressure points before starting the procedure. Time-out: I initiated and conducted the "Time-out" before starting the procedure, as per protocol. The patient was asked to participate by confirming the accuracy of the "Time Out" information. Verification of the correct person, site, and procedure were performed and confirmed by me, the nursing staff, and the patient. "Time-out" conducted as per Joint Commission's Universal Protocol (UP.01.01.01). Time: 1258  Description of Procedure:          Laterality: Left Levels:  L2, L3, L4, L5, & S1 Medial Branch Level(s), at the L3-4, L4-5, and the L5-S1 lumbar facet joints. Area Prepped: Lumbosacral Prepping solution: DuraPrep  (Iodine Povacrylex [0.7% available iodine] and Isopropyl Alcohol, 74% w/w) Safety Precautions: Aspiration looking for blood return was conducted prior to all injections. At no point did we inject any substances, as a needle was being advanced. Before injecting, the patient was told to immediately notify me if she was experiencing any new onset of "ringing in the ears, or metallic taste in the mouth". No attempts were made at seeking any paresthesias. Safe injection practices and needle disposal techniques used. Medications properly checked for expiration dates. SDV (single dose vial) medications used. After the completion of the procedure, all disposable equipment used was discarded in the proper designated medical waste containers. Local Anesthesia: Protocol guidelines were followed. The patient was positioned over the fluoroscopy table. The area was prepped in the usual manner. The time-out was completed. The target area was identified using fluoroscopy. A 12-in long, straight, sterile hemostat was used with fluoroscopic guidance to locate the targets for each level blocked. Once located, the skin was marked with an approved surgical skin marker. Once all sites were marked, the skin (epidermis, dermis, and hypodermis), as well as deeper tissues (fat, connective tissue and muscle) were infiltrated with a small amount of a short-acting local anesthetic, loaded on a 10cc syringe with a 25G, 1.5-in  Needle. An appropriate amount of time was allowed for local anesthetics to take effect before proceeding to the next step. Local Anesthetic: Lidocaine 2.0% The unused portion of the local anesthetic was discarded in the proper designated containers. Technical explanation of process:  Radiofrequency Ablation (RFA) L2 Medial Branch Nerve RFA: The target area for the L2 medial branch is at the junction of the postero-lateral aspect of the superior articular process and the superior, posterior, and medial edge of the  transverse process of L3. Under fluoroscopic guidance, a Radiofrequency needle was inserted until contact was made with os over the superior postero-lateral aspect of the pedicular shadow (target area). Sensory and motor testing was conducted to properly adjust the position of the needle. Once satisfactory placement of the needle was achieved, the numbing solution was slowly injected after negative aspiration for blood. 2.0 mL of the nerve block solution was injected without difficulty or complication. After waiting for at least 3 minutes, the  ablation was performed. Once completed, the needle was removed intact. L3 Medial Branch Nerve RFA: The target area for the L3 medial branch is at the junction of the postero-lateral aspect of the superior articular process and the superior, posterior, and medial edge of the transverse process of L4. Under fluoroscopic guidance, a Radiofrequency needle was inserted until contact was made with os over the superior postero-lateral aspect of the pedicular shadow (target area). Sensory and motor testing was conducted to properly adjust the position of the needle. Once satisfactory placement of the needle was achieved, the numbing solution was slowly injected after negative aspiration for blood. 2.0 mL of the nerve block solution was injected without difficulty or complication. After waiting for at least 3 minutes, the ablation was performed. Once completed, the needle was removed intact. L4 Medial Branch Nerve RFA: The target area for the L4 medial branch is at the junction of the postero-lateral aspect of the superior articular process and the superior, posterior, and medial edge of the transverse process of L5. Under fluoroscopic guidance, a Radiofrequency needle was inserted until contact was made with os over the superior postero-lateral aspect of the pedicular shadow (target area). Sensory and motor testing was conducted to properly adjust the position of the needle. Once  satisfactory placement of the needle was achieved, the numbing solution was slowly injected after negative aspiration for blood. 2.0 mL of the nerve block solution was injected without difficulty or complication. After waiting for at least 3 minutes, the ablation was performed. Once completed, the needle was removed intact. L5 Medial Branch Nerve RFA: The target area for the L5 medial branch is at the junction of the postero-lateral aspect of the superior articular process of S1 and the superior, posterior, and medial edge of the sacral ala. Under fluoroscopic guidance, a Radiofrequency needle was inserted until contact was made with os over the superior postero-lateral aspect of the pedicular shadow (target area). Sensory and motor testing was conducted to properly adjust the position of the needle. Once satisfactory placement of the needle was achieved, the numbing solution was slowly injected after negative aspiration for blood. 2.0 mL of the nerve block solution was injected without difficulty or complication. After waiting for at least 3 minutes, the ablation was performed. Once completed, the needle was removed intact. S1 Medial Branch Nerve RFA: The target area for the S1 medial branch is located inferior to the junction of the S1 superior articular process and the L5 inferior articular process, posterior, inferior, and lateral to the 6 o'clock position of the L5-S1 facet joint, just superior to the S1 posterior foramen. Under fluoroscopic guidance, the Radiofrequency needle was advanced until contact was made with os over the Target area. Sensory and motor testing was conducted to properly adjust the position of the needle. Once satisfactory placement of the needle was achieved, the numbing solution was slowly injected after negative aspiration for blood. 2.0 mL of the nerve block solution was injected without difficulty or complication. After waiting for at least 3 minutes, the ablation was performed.  Once completed, the needle was removed intact. Radiofrequency lesioning (ablation):  Radiofrequency Generator: NeuroTherm [id number] Sensory Stimulation Parameters: 50 Hz was used to locate & identify the nerve, making sure that the needle was positioned such that there was no sensory stimulation below 0.3 V or above 0.7 V. Motor Stimulation Parameters: 2 Hz was used to evaluate the motor component. Care was taken not to lesion any nerves that demonstrated motor stimulation of the lower extremities  at an output of less than 2.5 times that of the sensory threshold, or a maximum of 2.0 V. Lesioning Technique Parameters: Standard Radiofrequency settings. (Not bipolar or pulsed.) Temperature Settings: 80 degrees C Lesioning time: 60 seconds Intra-operative Compliance: Compliant Materials & Medications: Needle(s) (Electrode/Cannula) Type: Teflon-coated, curved tip, Radiofrequency needle(s) Gauge: 22G Length: 10cm Numbing solution: 0.2% PF-Ropivacaine + Triamcinolone (40 mg/mL) diluted to a final concentration of 4 mg of Triamcinolone/mL of Ropivacaine The unused portion of the solution was discarded in the proper designated containers.  Once the entire procedure was completed, the treated area was cleaned, making sure to leave some of the prepping solution back to take advantage of its long term bactericidal properties.  Illustration of the posterior view of the lumbar spine and the posterior neural structures. Laminae of L2 through S1 are labeled. DPRL5, dorsal primary ramus of L5; DPRS1, dorsal primary ramus of S1; DPR3, dorsal primary ramus of L3; FJ, facet (zygapophyseal) joint L3-L4; I, inferior articular process of L4; LB1, lateral branch of dorsal primary ramus of L1; IAB, inferior articular branches from L3 medial branch (supplies L4-L5 facet joint); IBP, intermediate branch plexus; MB3, medial branch of dorsal primary ramus of L3; NR3, third lumbar nerve root; S, superior articular process of L5;  SAB, superior articular branches from L4 (supplies L4-5 facet joint also); TP3, transverse process of L3.  Vitals:   06/30/19 1330 06/30/19 1340 06/30/19 1350 06/30/19 1400  BP: 132/69 133/85 (!) 131/98 115/73  Pulse:      Resp: (!) 22 (!) 31 (!) 32 (!) 30  Temp:      TempSrc:      SpO2: 95% 99% 100% 99%  Weight:      Height:       Start Time: 1258 hrs. End Time: 1330 hrs.  Imaging Guidance (Spinal):          Type of Imaging Technique: Fluoroscopy Guidance (Spinal) Indication(s): Assistance in needle guidance and placement for procedures requiring needle placement in or near specific anatomical locations not easily accessible without such assistance. Exposure Time: Please see nurses notes. Contrast: None used. Fluoroscopic Guidance: I was personally present during the use of fluoroscopy. "Tunnel Vision Technique" used to obtain the best possible view of the target area. Parallax error corrected before commencing the procedure. "Direction-depth-direction" technique used to introduce the needle under continuous pulsed fluoroscopy. Once target was reached, antero-posterior, oblique, and lateral fluoroscopic projection used confirm needle placement in all planes. Images permanently stored in EMR. Interpretation: No contrast injected. I personally interpreted the imaging intraoperatively. Adequate needle placement confirmed in multiple planes. Permanent images saved into the patient's record.  Antibiotic Prophylaxis:   Anti-infectives (From admission, onward)   None     Indication(s): None identified  Post-operative Assessment:  Post-procedure Vital Signs:  Pulse/HCG Rate: (!) 106(!) 101 Temp: 98.6 F (37 C) Resp: (!) 30 BP: 115/73 SpO2: 99 %  EBL: None  Complications: No immediate post-treatment complications observed by team, or reported by patient.  Note: The patient tolerated the entire procedure well. A repeat set of vitals were taken after the procedure and the patient  was kept under observation following institutional policy, for this type of procedure. Post-procedural neurological assessment was performed, showing return to baseline, prior to discharge. The patient was provided with post-procedure discharge instructions, including a section on how to identify potential problems. Should any problems arise concerning this procedure, the patient was given instructions to immediately contact us, at any time, without hesitation. In any case, we  plan to contact the patient by telephone for a follow-up status report regarding this interventional procedure.  Comments:  No additional relevant information.  Plan of Care  Orders:  Orders Placed This Encounter  Procedures  . Radiofrequency,Lumbar    Scheduling Instructions:     Side(s): Left-sided     Level: L3-4, L4-5, & L5-S1 Facets (L2, L3, L4, L5, & S1 Medial Branch Nerves)     Sedation: With Sedation     Timeframe: Today    Order Specific Question:   Where will this procedure be performed?    Answer:   [facility]  . DG PAIN [facility] C-ARM 1-60 MIN NO REPORT    Intraoperative interpretation by procedural physician at [facility].    Standing Status:   Standing    Number of Occurrences:   1    Order Specific Question:   Reason for exam:    Answer:   Assistance in needle guidance and placement for procedures requiring needle placement in or near specific anatomical locations not easily accessible without such assistance.  . Informed Consent Details: Physician/Practitioner Attestation; Transcribe to consent form and obtain patient signature    Nursing Order: Transcribe to consent form and obtain patient signature. Note: Always confirm laterality of pain with Ms. Richardson Dopp, before procedure. Procedure: Lumbar Facet Radiofrequency Ablation Indication/Reason: Low Back Pain, with our without leg pain, due to Facet Joint Arthralgia (Joint Pain) known as Lumbar Facet Syndrome, secondary to Lumbar, and/or  Lumbosacral Spondylosis (Arthritis of the Spine), without myelopathy or radiculopathy (Nerve Damage). Provider Attestation: I, Birney Belshe A. Laban Emperor, MD, (Pain Management Specialist), the physician/practitioner, attest that I have discussed with the patient the benefits, risks, side effects, alternatives, likelihood of achieving goals and potential problems during recovery for the procedure that I have provided informed consent.  . Provide equipment / supplies at bedside    Equipment required: Sterile "Radiofrequency Tray"; Large hemostat (1); Small hemostat (1); Towels (6-8); 4x4 sterile sponge pack (1) Radiofrequency Needle(s): Size: Regular Quantity: 5    Standing Status:   Standing    Number of Occurrences:   1    Order Specific Question:   Specify    Answer:   Radiofrequency Tray  . Miscellanous precautions    Standing Status:   Standing    Number of Occurrences:   1  . Miscellanous precautions    NOTE: Although It is true that patients can have allergies to shellfish and that shellfish contain iodine, most shellfish  allergies are due to two protein allergens present in the shellfish: tropomyosins and parvalbumin. Not all patients with shellfish allergies are allergic to iodine. However, as a precaution, avoid using iodine containing products.    Standing Status:   Standing    Number of Occurrences:   1   Chronic Opioid Analgesic:  No opioid analgesics prescribed by our practice.   Medications ordered for procedure: Meds ordered this encounter  Medications  . lidocaine (XYLOCAINE) 2 % (with pres) injection 400 mg  . lactated ringers infusion 1,000 mL  . midazolam (VERSED) 5 MG/5ML injection 1-2 mg    Make sure Flumazenil is available in the pyxis when using this medication. If oversedation occurs, administer 0.2 mg IV over 15 sec. If after 45 sec no response, administer 0.2 mg again over 1 min; may repeat at 1 min intervals; not to exceed 4 doses (1 mg)  . fentaNYL (SUBLIMAZE)  injection 25-50 mcg    Make sure Narcan is available in the pyxis when  using this medication. In the event of respiratory depression (RR< 8/min): Titrate NARCAN (naloxone) in increments of 0.1 to 0.2 mg IV at 2-3 minute intervals, until desired degree of reversal.  . ropivacaine (PF) 2 mg/mL (0.2%) (NAROPIN) injection 9 mL  . triamcinolone acetonide (KENALOG-40) injection 40 mg  . HYDROcodone-acetaminophen (NORCO/VICODIN) 5-325 MG tablet    Sig: Take 1 tablet by mouth every 6 (six) hours as needed for up to 7 days for severe pain. Must last 7 days.    Dispense:  28 tablet    Refill:  0    For acute post-operative pain. Not to be refilled. Must last 7 days.  Marland Kitchen HYDROcodone-acetaminophen (NORCO/VICODIN) 5-325 MG tablet    Sig: Take 1 tablet by mouth every 6 (six) hours as needed for up to 7 days for severe pain. Must last 7 days.    Dispense:  28 tablet    Refill:  0    For acute post-operative pain. Not to be refilled.  Must last 7 days.  . pregabalin (LYRICA) 75 MG capsule    Sig: Take 1 capsule (75 mg total) by mouth 2 (two) times daily for 15 days, THEN 1 capsule (75 mg total) 3 (three) times daily for 15 days.    Dispense:  75 capsule    Refill:  0    Fill one day early if pharmacy is closed on scheduled refill date. May substitute for generic if available.  . pregabalin (LYRICA) 100 MG capsule    Sig: Take 1 capsule (100 mg total) by mouth 2 (two) times daily for 15 days, THEN 1 capsule (100 mg total) 3 (three) times daily for 15 days.    Dispense:  75 capsule    Refill:  0    Fill one day early if pharmacy is closed on scheduled refill date. May substitute for generic if available.   Medications administered: We administered lidocaine, lactated ringers, midazolam, fentaNYL, ropivacaine (PF) 2 mg/mL (0.2%), and triamcinolone acetonide.  See the medical record for exact dosing, route, and time of administration.  Follow-up plan:   Return in about 6 weeks (around 08/11/2019) for (VV),  (PP) to evaluate RFA and Lyrica titration.       Interventional management options: Planned, scheduled, and/or pending:    Therapeutic left lumbar facet RFA #1 under fluoroscopic guidance and IV sedation, today.   Considering:   Pending results of EMG/PNCV of the lower extremities Diagnostic bilateral lumbar sympathetic block  Possible bilateral lumbar facet RFA  Diagnostic bilateral IA hip joint injection  Possible bilateral obturator + femoral NB  Possible bilateral obturator + femoral nerve RFA  Diagnostic bilateral SI joint block  Possible bilateral SI joint RFA    PRN Procedures:   Palliative right lumbar facet RFA #2 (last done 06/02/2019) Palliative bilateral lumbar facet block #3  Palliative right sacroiliac joint block #2     Recent Visits Date Type Provider Dept  06/02/19 Procedure visit Delano Metz, MD [facility]  05/06/19 Telemedicine Delano Metz, MD [facility]  04/21/19 Procedure visit Delano Metz, MD [facility]  04/14/19 Telemedicine Delano Metz, MD [facility]  04/02/19 Procedure visit Delano Metz, MD [facility]  Showing recent visits within past 90 days and meeting all other requirements   Today's Visits Date Type Provider Dept  06/30/19 Procedure visit Delano Metz, MD [facility]  Showing today's visits and meeting all other requirements   Future Appointments Date Type Provider Dept  08/17/19 Appointment  Milinda Pointer, MD [facility]  Showing future appointments within next 90 days and meeting all other requirements   Disposition: Discharge home  Discharge (Date  Time): 06/30/2019; 1402 hrs.   Primary Care Physician: [name], DO Location: [facility] Note by: Gaspar Cola, MD Date: 06/30/2019; Time: 2:20 PM  Disclaimer:  Medicine is not an Chief Strategy Officer. The only guarantee in medicine is that  nothing is guaranteed. It is important to note that the decision to proceed with this intervention was based on the information collected from the patient. The Data and conclusions were drawn from the patient's questionnaire, the interview, and the physical examination. Because the information was provided in large part by the patient, it cannot be guaranteed that it has not been purposely or unconsciously manipulated. Every effort has been made to obtain as much relevant data as possible for this evaluation. It is important to note that the conclusions that lead to this procedure are derived in large part from the available data. Always take into account that the treatment will also be dependent on availability of resources and existing treatment guidelines, considered by other Pain Management Practitioners as being common knowledge and practice, at the time of the intervention. For Medico-Legal purposes, it is also important to point out that variation in procedural techniques and pharmacological choices are the acceptable norm. The indications, contraindications, technique, and results of the above procedure should only be interpreted and judged by a Board-Certified Interventional Pain Specialist with extensive familiarity and expertise in the same exact procedure and technique.

## 2019-06-30 NOTE — Patient Instructions (Signed)

## 2019-06-30 NOTE — Progress Notes (Signed)
Safety precautions to be maintained throughout the outpatient stay will include: orient to surroundings, keep bed in low position, maintain call bell within reach at all times, provide assistance with transfer out of bed and ambulation.  

## 2019-07-01 ENCOUNTER — Telehealth: Payer: Self-pay | Admitting: *Deleted

## 2019-07-01 NOTE — Telephone Encounter (Signed)
Swelling in face and neck. Also swelling in lower back. Advised that swelling in face may be due to steroids, may resolve spontaneously in a few days. Denies any shortness of breath. Advised that swelling in lower back due to the procedure and inflammation. Advised to apply heat. Advised to go to ED if any shortness of breath or any "closing of the throat."

## 2019-07-10 ENCOUNTER — Telehealth: Payer: Self-pay | Admitting: *Deleted

## 2019-07-10 NOTE — Telephone Encounter (Signed)
Spoke with patient.   She states she had a RF on 06-30-2019 and the next day her face became extremely swollen.  States her back swelled up also.  States her last procedure she was given Benadryl IV and this procedure she was not given the Benadryl.  States that currently the right cheek is very edematous still.  States she has been taking Benadryl at home and the swellling in the left side of her face and the swelling in her back has subsided, but the right side is still very edematous.  Denies any difficulty breathing or fever.  Called Dr Laban Emperor.  Explained situation.  States to call patinet and verify that there is no facial droop or other neurological changes.  Called patient who then denies any numbness, tingling, facial droop or speech impairement.  Instructed patient to continue her Benadryl as prescribed and to go to the ED for any change or worsening of condition.  Patient states understanding.

## 2019-07-13 ENCOUNTER — Telehealth: Payer: Self-pay

## 2019-07-13 NOTE — Telephone Encounter (Signed)
Called patient to check on the swelling in her face.  Patient states that it is still swollen but has improved since we last spoke on Friday.  Patient states she is taking Benadryl at night only because it makes her sleepy. Continues to deny fever, shortness of breath.  Instructed to notify us of any further symptoms.

## 2019-07-15 ENCOUNTER — Telehealth: Payer: Self-pay | Admitting: Pain Medicine

## 2019-07-15 NOTE — Telephone Encounter (Signed)
This has been an issue since her RFA on 06-30-19. See previous telephone encounters.

## 2019-07-15 NOTE — Telephone Encounter (Signed)
Patient lvmail at 8:06 stating her face is still swollen, she is taking benadryl but it is not helping, using compresses. Please call asap

## 2019-07-15 NOTE — Telephone Encounter (Signed)
Have her come into the clinics for an evaluation.  I already talked to [facility] about this and it is highly unlikely that it is related to the procedure.  Asked the patient if she has gone to see her PCP.

## 2019-07-16 ENCOUNTER — Telehealth: Payer: Self-pay | Admitting: Pain Medicine

## 2019-07-16 NOTE — Telephone Encounter (Signed)
Pt called and stated that she thinks the swelling in her face is from the increased dose of Lyrica. She states this started the same day that Dr Dorris Carnes increased her dose. She states she has an eye appt today and can't come in today and wanted to come in next week but Dr Dorris Carnes is on vacation. She asks if Dr Dorris Carnes wants her to just decrease the Lyrica instead?

## 2019-07-16 NOTE — Telephone Encounter (Signed)
Please call patient to come for eval r/e swelling in her face.

## 2019-07-16 NOTE — Telephone Encounter (Signed)
Patient advised to not discontinue Lyrica without tapering, but she may decrease to previous dose.

## 2019-07-17 ENCOUNTER — Encounter: Payer: Self-pay | Admitting: Emergency Medicine

## 2019-07-17 ENCOUNTER — Emergency Department: Payer: BLUE CROSS/BLUE SHIELD

## 2019-07-17 ENCOUNTER — Emergency Department
Admission: EM | Admit: 2019-07-17 | Discharge: 2019-07-17 | Disposition: A | Discharge status: Home / Self Care | Payer: BLUE CROSS/BLUE SHIELD | Attending: Student | Admitting: Student

## 2019-07-17 ENCOUNTER — Other Ambulatory Visit: Payer: Self-pay

## 2019-07-17 DIAGNOSIS — Z7984 Long term (current) use of oral hypoglycemic drugs: Secondary | ICD-10-CM | POA: Diagnosis not present

## 2019-07-17 DIAGNOSIS — I10 Essential (primary) hypertension: Secondary | ICD-10-CM | POA: Diagnosis not present

## 2019-07-17 DIAGNOSIS — Y939 Activity, unspecified: Secondary | ICD-10-CM | POA: Insufficient documentation

## 2019-07-17 DIAGNOSIS — S99912A Unspecified injury of left ankle, initial encounter: Secondary | ICD-10-CM | POA: Diagnosis present

## 2019-07-17 DIAGNOSIS — Y929 Unspecified place or not applicable: Secondary | ICD-10-CM | POA: Insufficient documentation

## 2019-07-17 DIAGNOSIS — Z79899 Other long term (current) drug therapy: Secondary | ICD-10-CM | POA: Insufficient documentation

## 2019-07-17 DIAGNOSIS — W1839XA Other fall on same level, initial encounter: Secondary | ICD-10-CM | POA: Diagnosis not present

## 2019-07-17 DIAGNOSIS — E119 Type 2 diabetes mellitus without complications: Secondary | ICD-10-CM | POA: Insufficient documentation

## 2019-07-17 DIAGNOSIS — Y999 Unspecified external cause status: Secondary | ICD-10-CM | POA: Diagnosis not present

## 2019-07-17 DIAGNOSIS — S82842A Displaced bimalleolar fracture of left lower leg, initial encounter for closed fracture: Secondary | ICD-10-CM | POA: Diagnosis not present

## 2019-07-17 MED ORDER — ALPRAZOLAM 0.25 MG PO TABS: 0.2500 mg | ORAL_TABLET | Freq: Once | ORAL | Status: DC

## 2019-07-17 MED ORDER — LORAZEPAM 0.5 MG PO TABS
0.5000 mg | ORAL_TABLET | Freq: Once | ORAL | Status: AC
  Administered 2019-07-17: 0.5 mg via ORAL
  Filled 2019-07-17: qty 1

## 2019-07-17 MED ORDER — HYDROCODONE-ACETAMINOPHEN 5-325 MG PO TABS
1.0000 | ORAL_TABLET | Freq: Once | ORAL | Status: AC
  Administered 2019-07-17: 1 via ORAL
  Filled 2019-07-17: qty 1

## 2019-07-17 MED ORDER — HYDROCODONE-ACETAMINOPHEN 5-325 MG PO TABS: 1.0000 | ORAL_TABLET | Freq: Four times a day (QID) | ORAL | 0 refills | Status: DC | PRN

## 2019-07-17 NOTE — ED Triage Notes (Signed)
PT to ER states her left leg gave out last night and she fell.  PT with c/o left ankle pain and swelling.

## 2019-07-17 NOTE — ED Provider Notes (Signed)
[facility] Emergency Department Provider Note  ____________________________________________   First MD Initiated Contact with Patient 07/17/19 1029     (approximate)  I have reviewed the triage vital signs and the nursing notes.   HISTORY  Chief Complaint Ankle Pain    HPI Lisa Galvan is a 59 y.o. female presents emergency department complaining of left ankle pain.  States her leg gave out last night and she fell hurting the left ankle.  No other injuries reported.  No head injury.  Patient's been unable to bear weight since the incident.    Past Medical History:  Diagnosis Date  . Allergy   . Arthritis    spine  . Bilateral leg edema   . Chest pain 12/15/2014  . Chronic sciatica 12/10/2014  . Chronic sciatica 12/10/2014  . Diabetes mellitus without complication (HCC)   . Essential hypertension 12/10/2014  . Essential hypertension 12/10/2014  . Gastroesophageal reflux disease without esophagitis   . GERD (gastroesophageal reflux disease)   . Hyperlipidemia   . Hypertension   . Mixed hyperlipidemia   . Obesity (BMI 30.0-34.9) 12/10/2014  . Type 2 diabetes mellitus without complication (HCC) 12/10/2014    Patient Active Problem List   Diagnosis Date Noted  . Acute postoperative pain 06/30/2019  . History of allergy to radiographic contrast media 06/02/2019  . History of allergy to shellfish 06/02/2019  . History of Allergy to iodine 06/02/2019    Class: History of  . History of anaphylaxis 06/02/2019  . Other spondylosis, sacral and sacrococcygeal region 04/21/2019  . Abnormal MRI, cervical spine (02/08/2019) 03/25/2019  . Lower extremity weakness (Bilateral) 03/25/2019  . Coordination impairment (lower extremity) 03/25/2019  . Hypomagnesemia 03/24/2019  . Spondylosis without myelopathy or radiculopathy, lumbosacral region 03/24/2019  . Lumbar Facet Hypertrophy 03/24/2019  . Grade 1 Anterolisthesis of L4/5 03/11/2019  . Chronic hip pain  (Bilateral) (R>L) 03/11/2019  . Chronic sacroiliac joint pain (Bilateral) 03/11/2019  . Sacroiliac joint somatic dysfunction (Bilateral) 03/11/2019  . Chronic feet pain (Primary Area of Pain) (Bilateral) 03/11/2019  . Chronic leg and foot pain (Bilateral) 03/11/2019  . Diabetic peripheral neuropathy (HCC) 03/11/2019  . Chronic neuropathic pain 03/11/2019  . Neurogenic pain 03/11/2019  . Chronic musculoskeletal pain 03/11/2019  . Osteoarthritis involving multiple joints 03/11/2019  . Chronic pain syndrome 03/10/2019  . Pharmacologic therapy 03/10/2019  . Disorder of skeletal system 03/10/2019  . Problems influencing health status 03/10/2019  . Chronic low back pain (Third area of Pain) (Bilateral) w/o sciatica 03/10/2019  . Chronic lower extremity pain (Secondary area of Pain) (Bilateral) 03/10/2019  . Abnormal MRI, lumbar spine (02/08/2019) 03/10/2019  . Essential hypertension 10/06/2018  . Cholelithiasis 06/23/2018  . Fatty liver 06/23/2018  . Helicobacter pylori infection 05/13/2017  . Mastalgia in female 04/09/2017  . Bilateral leg edema 07/02/2016  . DDD (degenerative disc disease), lumbar 12/16/2014  . Lumbar facet syndrome (Bilateral) 12/16/2014  . Sacroiliac joint dysfunction 12/16/2014  . Neuropathy due to secondary diabetes (HCC) 12/16/2014  . Vitamin D deficiency 12/13/2014  . Morbid obesity (HCC) 12/10/2014  . Type 2 diabetes mellitus with diabetic neuropathy, unspecified (HCC) 12/10/2014  . Hyperlipidemia 12/10/2014  . Gastroesophageal reflux disease without esophagitis 12/10/2014    Past Surgical History:  Procedure Laterality Date  . ABDOMINAL HYSTERECTOMY    . COLONOSCOPY WITH PROPOFOL N/A 03/30/2019   Procedure: COLONOSCOPY WITH PROPOFOL;  Surgeon: Toney Reil, MD;  Location: [facility] ENDOSCOPY;  Service: Gastroenterology;  Laterality: N/A;  . ESOPHAGOGASTRODUODENOSCOPY (EGD) WITH  PROPOFOL N/A 03/12/2017   Procedure: ESOPHAGOGASTRODUODENOSCOPY (EGD) WITH  PROPOFOL;  Surgeon: Lin Landsman, MD;  Location: [facility] ENDOSCOPY;  Service: Gastroenterology;  Laterality: N/A;  . ESOPHAGOGASTRODUODENOSCOPY (EGD) WITH PROPOFOL N/A 03/30/2019   Procedure: ESOPHAGOGASTRODUODENOSCOPY (EGD) WITH PROPOFOL;  Surgeon: Lin Landsman, MD;  Location: [facility] ENDOSCOPY;  Service: Gastroenterology;  Laterality: N/A;  . HERNIA REPAIR    . UMBILICAL HERNIA REPAIR N/A 05/01/2017   Procedure: HERNIA REPAIR UMBILICAL ADULT;  Surgeon: Vickie Epley, MD;  Location: [facility] ORS;  Service: General;  Laterality: N/A;    Prior to Admission medications   Medication Sig Start Date End Date Taking? Authorizing Provider  amLODipine (NORVASC) 5 MG tablet TAKE 1 TABLET(5 MG) BY MOUTH DAILY 04/13/19   Johnson, Megan P, DO  atorvastatin (LIPITOR) 20 MG tablet Take 1 tablet (20 mg total) by mouth daily. 04/13/19   Johnson, Megan P, DO  Cholecalciferol (VITAMIN D-3) 5000 units TABS Take 5,000 Units by mouth daily.    [provider]  DULoxetine (CYMBALTA) 60 MG capsule Take 60 mg by mouth daily.    [provider]  empagliflozin (JARDIANCE) 10 MG TABS tablet Take 10 mg by mouth daily. 04/13/19   Johnson, Megan P, DO  EPINEPHrine (EPIPEN 2-PAK) 0.3 mg/0.3 mL IJ SOAJ injection Inject 0.3 mLs (0.3 mg total) into the muscle once. Take for severe allergic reaction, then come immediately to the Emergency Department or call 911. 03/24/15   Hinda Kehr, MD  fexofenadine (ALLEGRA) 180 MG tablet TAKE 1 TABLET BY MOUTH EVERY DAY 03/17/19   Johnson, Megan P, DO  hydrochlorothiazide (HYDRODIURIL) 25 MG tablet Take 1 tablet (25 mg total) by mouth daily. 05/15/19   Park Liter P, DO  HYDROcodone-acetaminophen (NORCO/VICODIN) 5-325 MG tablet Take 1 tablet by mouth every 6 (six) hours as needed for moderate pain. 07/17/19   Nathin Saran, Linden Dolin, PA-C  ibuprofen (ADVIL) 800 MG tablet TAKE 1 TABLET(800 MG) BY MOUTH EVERY 8 HOURS AS NEEDED 04/13/19   Johnson, Megan P, DO  ketoconazole  (NIZORAL) 2 % cream Apply 1 application topically daily. 10/06/18   Park Liter P, DO  Magnesium Oxide 500 MG CAPS Take 1 capsule (500 mg total) by mouth 2 (two) times daily at 8 am and 10 pm. 03/25/19 09/21/19  Milinda Pointer, MD  metFORMIN (GLUCOPHAGE) 500 MG tablet Take 1 tablet (500 mg total) by mouth 2 (two) times daily with a meal. 04/13/19   Johnson, Megan P, DO  montelukast (SINGULAIR) 10 MG tablet Take 1 tablet (10 mg total) by mouth at bedtime. 04/13/19   Park Liter P, DO  omeprazole (PRILOSEC) 40 MG capsule Take 1 capsule (40 mg total) by mouth daily before breakfast. 04/13/19 05/13/19  Johnson, Megan P, DO  pioglitazone (ACTOS) 30 MG tablet TAKE 1 TABLET(30 MG) BY MOUTH DAILY 04/13/19   Johnson, Megan P, DO  pregabalin (LYRICA) 100 MG capsule Take 1 capsule (100 mg total) by mouth 2 (two) times daily for 15 days, THEN 1 capsule (100 mg total) 3 (three) times daily for 15 days. 07/29/19 08/28/19  Milinda Pointer, MD  pregabalin (LYRICA) 75 MG capsule Take 1 capsule (75 mg total) by mouth 2 (two) times daily for 15 days, THEN 1 capsule (75 mg total) 3 (three) times daily for 15 days. 06/30/19 07/30/19  Milinda Pointer, MD  valACYclovir (VALTREX) 500 MG tablet Take 500 mg by mouth 2 (two) times daily as needed (for outbreak).     [provider]    Allergies Amlodipine  besy-benazepril hcl, Ivp dye [iodinated diagnostic agents], Shellfish allergy, Shellfish-derived products, and Ace inhibitors  Family History  Problem Relation Age of Onset  . Diabetes Mother   . Cancer Mother        breast and stomach  . Breast cancer Mother 72  . Heart disease Father   . Kidney disease Father        dialysis  . Cancer Father        colon  . Diabetes Father   . Hypertension Sister   . Hypertension Brother   . Congestive Heart Failure Maternal Grandmother   . Diabetes Maternal Grandmother   . Hypertension Maternal Grandmother     Social History Social History   Tobacco Use  .  Smoking status: Never Smoker  . Smokeless tobacco: Never Used  Substance Use Topics  . Alcohol use: Yes    Alcohol/week: 2.0 standard drinks    Types: 2 Glasses of wine per week    Comment: rare wine  . Drug use: No    Review of Systems  Constitutional: No fever/chills Eyes: No visual changes. ENT: No sore throat. Respiratory: Denies cough Genitourinary: Negative for dysuria. Musculoskeletal: Negative for back pain.  Positive for left ankle pain Skin: Negative for rash. Psychiatric: no mood changes,     ____________________________________________   PHYSICAL EXAM:  VITAL SIGNS: ED Triage Vitals  Enc Vitals Group     BP 07/17/19 1034 139/85     Pulse Rate 07/17/19 1034 (!) 107     Resp 07/17/19 1034 18     Temp 07/17/19 1034 98.5 F (36.9 C)     Temp Source 07/17/19 1034 Oral     SpO2 07/17/19 1034 97 %     Weight 07/17/19 1035 186 lb (84.4 kg)     Height 07/17/19 1035 5\' 3"  (1.6 m)     Head Circumference --      Peak Flow --      Pain Score 07/17/19 1035 10     Pain Loc --      Pain Edu? --      Excl. in GC? --     Constitutional: Alert and oriented. Well appearing and in no acute distress. Eyes: Conjunctivae are normal.  Head: Atraumatic. Nose: No congestion/rhinnorhea.   Neck:  supple no lymphadenopathy noted Cardiovascular: Normal rate, regular rhythm. Respiratory: Normal respiratory effort.  No retractions, GU: deferred Musculoskeletal: Large amount of swelling and bruising noted of the left ankle,.  Is tender to palpation anteriorly and laterally, decreased range of motion, neurovascular is intact  neurologic:  Normal speech and language.  Skin:  Skin is warm, dry and intact. No rash noted. Psychiatric: Mood and affect are normal. Speech and behavior are normal.  ____________________________________________   LABS (all labs ordered are listed, but only abnormal results are displayed)  Labs Reviewed - No data to  display ____________________________________________   ____________________________________________  RADIOLOGY  X-ray of the left ankle  ____________________________________________   PROCEDURES  Procedure(s) performed: Cam walker, walker   Procedures    ____________________________________________   INITIAL IMPRESSION / ASSESSMENT AND PLAN / ED COURSE  Pertinent labs & imaging results that were available during my care of the patient were reviewed by me and considered in my medical decision making (see chart for details).   Patient 59 year old female presents emergency department after fall yesterday.  Complaining of left ankle pain.  See HPI  Physical exam shows left ankle to be bruised tender and swollen with most likely a  fracture.  X-ray of the left ankle shows a bimalleolar fracture  Paged Dr. Allena Katz.  He states that the patient will need surgery.  When he discussed this with the patient she is refusing surgery today.  His office will reach out to her to reschedule her surgery in approximately 2 weeks.  She is placed in a posterior OCL along with sugar tong to ensure stability of the ankle.  She is given a walker.  Strict instructions to not bear weight on the ankle.  She was given a work note stating she should not return to work until she had been cleared by orthopedics.  She was given a prescription for hydrocodone, was given a walker.  She was discharged in stable condition.    Lisa Galvan was evaluated in Emergency Department on 07/17/2019 for the symptoms described in the history of present illness. She was evaluated in the context of the global COVID-19 pandemic, which necessitated consideration that the patient might be at risk for infection with the SARS-CoV-2 virus that causes COVID-19. Institutional protocols and algorithms that pertain to the evaluation of patients at risk for COVID-19 are in a state of rapid change based on information released by regulatory  bodies including the CDC and federal and state organizations. These policies and algorithms were followed during the patient's care in the ED.   As part of my medical decision making, I reviewed the following data within the electronic MEDICAL RECORD NUMBER History obtained from family, Nursing notes reviewed and incorporated, Old chart reviewed, Radiograph reviewed , A consult was requested and obtained from this/these consultant(s) Orthopedics, Notes from prior ED visits and [place] Controlled Substance Database  ____________________________________________   FINAL CLINICAL IMPRESSION(S) / ED DIAGNOSES  Final diagnoses:  Bimalleolar fracture of left ankle, closed, initial encounter      NEW MEDICATIONS STARTED DURING THIS VISIT:  Discharge Medication List as of 07/17/2019 11:41 AM    START taking these medications   Details  HYDROcodone-acetaminophen (NORCO/VICODIN) 5-325 MG tablet Take 1 tablet by mouth every 6 (six) hours as needed for moderate pain., Starting Fri 07/17/2019, Normal         Note:  This document was prepared using Dragon voice recognition software and may include unintentional dictation errors.    Faythe Ghee, PA-C 07/17/19 1603    Miguel Aschoff., MD 07/17/19 [phone]

## 2019-07-17 NOTE — Discharge Instructions (Addendum)
Follow-up with prenatal clinic orthopedics.  Dr. Allena Katz said his office will reach out to you concerning surgery Take the pain medication as prescribed Keep the foot elevated above the chest is much as possible Apply ice to the ankle

## 2019-07-21 ENCOUNTER — Other Ambulatory Visit: Payer: Self-pay

## 2019-07-21 ENCOUNTER — Telehealth (INDEPENDENT_AMBULATORY_CARE_PROVIDER_SITE_OTHER): Payer: BLUE CROSS/BLUE SHIELD | Admitting: Family Medicine

## 2019-07-21 ENCOUNTER — Encounter: Payer: Self-pay | Admitting: Family Medicine

## 2019-07-21 ENCOUNTER — Ambulatory Visit: Payer: BLUE CROSS/BLUE SHIELD | Attending: Pain Medicine | Admitting: Pain Medicine

## 2019-07-21 ENCOUNTER — Encounter: Payer: Self-pay | Admitting: Pain Medicine

## 2019-07-21 ENCOUNTER — Telehealth: Payer: Self-pay | Admitting: Family Medicine

## 2019-07-21 VITALS — BP 128/78 | HR 95 | Temp 98.2°F

## 2019-07-21 VITALS — BP 124/83 | HR 101 | Temp 98.3°F | Ht 63.0 in | Wt 187.0 lb

## 2019-07-21 DIAGNOSIS — Z13820 Encounter for screening for osteoporosis: Secondary | ICD-10-CM

## 2019-07-21 DIAGNOSIS — G8929 Other chronic pain: Secondary | ICD-10-CM | POA: Insufficient documentation

## 2019-07-21 DIAGNOSIS — M545 Low back pain, unspecified: Secondary | ICD-10-CM

## 2019-07-21 DIAGNOSIS — S82842D Displaced bimalleolar fracture of left lower leg, subsequent encounter for closed fracture with routine healing: Secondary | ICD-10-CM | POA: Diagnosis not present

## 2019-07-21 DIAGNOSIS — R220 Localized swelling, mass and lump, head: Secondary | ICD-10-CM | POA: Diagnosis not present

## 2019-07-21 DIAGNOSIS — M47816 Spondylosis without myelopathy or radiculopathy, lumbar region: Secondary | ICD-10-CM | POA: Diagnosis not present

## 2019-07-21 DIAGNOSIS — R600 Localized edema: Secondary | ICD-10-CM | POA: Diagnosis not present

## 2019-07-21 NOTE — Telephone Encounter (Signed)
Patient calling, states she was advised by insurance company to contact PCP to order home health services due to recent foot injury. She states she is needing help with showering, specifically. Patient requesting call back from CMA to discuss

## 2019-07-21 NOTE — Progress Notes (Signed)
BP 128/78   Pulse 95   Temp 98.2 F (36.8 C)    Subjective:    Patient ID: Lisa Galvan, female    DOB: 01-17-1961, 59 y.o.   MRN: [id number]  HPI: Lisa Galvan is a 59 y.o. female  Chief Complaint  Patient presents with  . Referral    Home health, broken ankle in two places, no help    ER FOLLOW UP Time since discharge: 4 days Hospital/facility: [facility] Diagnosis: Bimalleolar ankle fracture Procedures/tests: x-ray Consultants: orthopedics New medications: hydrocodone, walker Discharge instructions:  Follow up with orthopedics, to have surgery in 2 weeks.  Status: stable  Seeing Orthopedics tomorrow. Hoping that she can get surgery sooner. She's not able to do anything, has been in pain. She's very concerned about caring for herself. She has no help at home and is not able to get into the shower or properly bathe. She needs some help.   Relevant past medical, surgical, family and social history reviewed and updated as indicated. Interim medical history since our last visit reviewed. Allergies and medications reviewed and updated.  Review of Systems  Constitutional: Negative.   Respiratory: Negative.   Cardiovascular: Negative.   Musculoskeletal: Positive for arthralgias, gait problem and joint swelling. Negative for back pain, myalgias, neck pain and neck stiffness.  Skin: Negative.   Psychiatric/Behavioral: Negative.     Per HPI unless specifically indicated above     Objective:    BP 128/78   Pulse 95   Temp 98.2 F (36.8 C)   Wt Readings from Last 3 Encounters:  07/21/19 187 lb (84.8 kg)  07/17/19 186 lb (84.4 kg)  06/30/19 183 lb (83 kg)    Physical Exam Vitals and nursing note reviewed.  Constitutional:      General: She is not in acute distress.    Appearance: Normal appearance. She is not ill-appearing, toxic-appearing or diaphoretic.  HENT:     Head: Normocephalic and atraumatic.     Right Ear: External ear normal.     Left Ear: External ear  normal.     Nose: Nose normal.     Mouth/Throat:     Mouth: Mucous membranes are moist.     Pharynx: Oropharynx is clear.  Eyes:     General: No scleral icterus.       Right eye: No discharge.        Left eye: No discharge.     Conjunctiva/sclera: Conjunctivae normal.     Pupils: Pupils are equal, round, and reactive to light.  Pulmonary:     Effort: Pulmonary effort is normal. No respiratory distress.     Comments: Speaking in full sentences Musculoskeletal:        General: Normal range of motion.     Cervical back: Normal range of motion.  Skin:    Coloration: Skin is not jaundiced or pale.     Findings: No bruising, erythema, lesion or rash.  Neurological:     Mental Status: She is alert and oriented to person, place, and time. Mental status is at baseline.  Psychiatric:        Mood and Affect: Mood normal.        Behavior: Behavior normal.        Thought Content: Thought content normal.        Judgment: Judgment normal.     Results for orders placed or performed in visit on 05/18/19  Basic metabolic panel  Result Value Ref Range   Glucose 100 (H)  65 - 99 mg/dL   BUN 10 8 - 27 mg/dL   Creatinine, Ser 0.88 0.57 - 1.00 mg/dL   GFR calc non Af Amer 72 >59 mL/min/1.73   GFR calc Af Amer 83 >59 mL/min/1.73   BUN/Creatinine Ratio 11 (L) 12 - 28   Sodium 144 134 - 144 mmol/L   Potassium 4.0 3.5 - 5.2 mmol/L   Chloride 101 96 - 106 mmol/L   CO2 26 20 - 29 mmol/L   Calcium 10.0 8.7 - 10.3 mg/dL      Assessment & Plan:   Problem List Items Addressed This Visit    None    Visit Diagnoses    Closed bimalleolar fracture of left ankle with routine healing, subsequent encounter    -  Primary   Seeing ortho tomorrow. Needs help at home. Will get home health out and see about a personal aide. Referrals generated. Call with any concerns.    Relevant Orders   DG Bone Density   Referral to Chronic Care Management Services   Ambulatory referral to [facility] for  osteoporosis       Given fall from standing with fracture- we will check DEXA. Ordered today.   Relevant Orders   DG Bone Density       Follow up plan: Return if symptoms worsen or fail to improve.    . This visit was completed via MyChart due to the restrictions of the COVID-19 pandemic. All issues as above were discussed and addressed. Physical exam was done as above through visual confirmation on MyChart. If it was felt that the patient should be evaluated in the office, they were directed there. The patient verbally consented to this visit. . Location of the patient: home . Location of the provider: work . Those involved with this call:  . Provider: Park Liter, DO . CMA: Tiffany Reel, CMA . Front Desk/Registration: Don Perking  . Time spent on call: 15 minutes with patient face to face via video conference. More than 50% of this time was spent in counseling and coordination of care. 23 minutes total spent in review of patient's record and preparation of their chart.

## 2019-07-21 NOTE — Patient Instructions (Signed)
Call to schedule your bone density [facility]  Address: [address], [place],  [address]  Phone: [phone]  

## 2019-07-21 NOTE — Progress Notes (Signed)
Safety precautions to be maintained throughout the outpatient stay will include: orient to surroundings, keep bed in low position, maintain call bell within reach at all times, provide assistance with transfer out of bed and ambulation.  

## 2019-07-21 NOTE — Progress Notes (Signed)
PROVIDER NOTE: Information contained herein reflects review and annotations entered in association with encounter. Interpretation of such information and data should be left to medically-trained personnel. Information provided to patient can be located elsewhere in the medical record under "Patient Instructions". Document created using STT-dictation technology, any transcriptional errors that may result from process are unintentional.    Patient: Lisa Galvan  Service Category: E/M  Provider: Gaspar Cola, MD  DOB: 10-Apr-1961  DOS: 07/21/2019  Referring Provider: [name], DO  MRN: [id number]  Setting: Ambulatory outpatient  PCP: [name], DO  Type: Established Patient  Specialty: Interventional Pain Management    Location: Office  Delivery: Face-to-face     HPI  Reason for Visit: Lisa Galvan is a 60 y.o. year old, female patient, who comes today with a chief complaint of Leg Pain Last Appointment: Her last appointment at our practice was on 07/16/2019. I last saw her on 07/16/2019.  Pain Assessment: Today, Lisa Galvan describes the severity of the Chronic pain as a 4 /10. She indicates the location/referral of the pain to be Leg(left) Left/pain radiaites from ankle to toes. Onset was: More than a month ago. The quality of pain is described as Aching, Burning, Throbbing, Stabbing. Temporal description, or timing of pain is: Constant. Possible modifying factors: meds. Lisa Galvan's  height is 5' 3" (1.6 m) and weight is 187 lb (84.8 kg). Her temperature is 98.3 F (36.8 C). Her blood pressure is 124/83 and her pulse is 101 (abnormal). Her oxygen saturation is 97%.   HX: The patient comes into the clinics today for evaluation of hemifacial edema, which apparently started after her last lumbar facet radiofrequency ablation.  The patient is concerned about the possibility of a reaction to one of the medications used.  She does have a history of anaphylactic type reactions to shellfish,  iodine, and IVP dyes, none of which were used during or after the procedure.  The patient called the clinic the day after the procedure indicating that she had some swelling in the lower back and the face and she was told by Coralyn Helling, one of the nurses on staff that it may be secondary to the steroids and that it should go away.  She was also cautioned to go to the emergency room if there was any type of difficulty breathing or swelling of the airways.  Follow-up 9 days later indicated that the patient had been taking some Benadryl, which she believes helped bring some of the swelling down.  Questions regarding neurological changes were all negative.  The patient has continued to be followed through telephone calls and she has denied any fevers, chills, or any signs or symptoms suggesting an infection.  However, she has continued to complain about swelling on her face.  During this entire process, the patient has been encouraged to see her primary care physician to rule out other possible causes of this hemifacial swelling.  Due to the persistent nature of the condition, I asked to have the patient come in for evaluation on 07/15/2018.  The patient later stated that she thought that this could be secondary to the increase in the Lyrica.  She did not want to come in at that time and asked to come in perhaps the following week.  On 07/17/2018 the patient ended up going to the ED indicating that her left leg had given out and she had fallen.  At the time her primary complaint was that of left ankle pain and  swelling.  No mention of facial swelling or neurological problems on that ED visit.  X-ray of the left ankle showed a bimalleolar fracture.   Review of literature: Possible causes of facial swelling include angioedema, usually triggered by NSAIDs, angiotensin converting enzyme (ACE)  inhibitors (drugs ending w/ "pril"), and angiotensin-2 receptor blockers (ARBs) (drugs ending w/ "sartan").  Out of these, the only one  taken by the patient is ibuprofen.  Subjective: According to the patient the swelling in her face started the day after her procedure.  On that date, not only did we do the procedure but we also increase her Lyrica from 50 to 75 mg.  She seems to think that it may be related to the Lyrica.  She denies any redness, fever, or tenderness.  She indicated that she took some Benadryl and she went down on the Lyrica to 25 mg.  She seems to think that this has helped.  Exam: Periorbital cellulitis: None Temp: Afebrile Skin paleness: None.  Coloration is within normal limits. Softness: Within normal limits Tenderness: None Swelling: None Erythema: None Cervical lymphadenopathy: None Submaxillary lymphadenopathy: None Axillary lymphadenopathy: None  Post-Procedure Evaluation  Procedure: Therapeutic left lumbar facet RFA #1 under fluoroscopic guidance and IV sedation Pre-procedure pain level: 3/10 Post-procedure: 0/10 (100% relief)  Sedation: Sedation provided.  Effectiveness during initial hour after procedure(Ultra-Short Term Relief): 100 %   Local anesthetic used: Long-acting (4-6 hours) Effectiveness: Defined as any analgesic benefit obtained secondary to the administration of local anesthetics. This carries significant diagnostic value as to the etiological location, or anatomical origin, of the pain. Duration of benefit is expected to coincide with the duration of the local anesthetic used.  Effectiveness during initial 4-6 hours after procedure(Short-Term Relief): 100 %   Long-term benefit: Defined as any relief past the pharmacologic duration of the local anesthetics.  Effectiveness past the initial 6 hours after procedure(Long-Term Relief): 95 %   Current benefits: Defined as benefit that persist at this time.   Analgesia:  90-100% better Function: Lisa Galvan reports improvement in function ROM: Lisa Galvan reports improvement in ROM  ROS  Constitutional: Denies any fever or  chills Gastrointestinal: No reported hemesis, hematochezia, vomiting, or acute GI distress Musculoskeletal: Denies any acute onset joint swelling, redness, loss of ROM, or weakness Neurological: No reported episodes of acute onset apraxia, aphasia, dysarthria, agnosia, amnesia, paralysis, loss of coordination, or loss of consciousness  Medication Review  DULoxetine, EPINEPHrine, HYDROcodone-acetaminophen, Magnesium Oxide, Vitamin D-3, amLODipine, atorvastatin, empagliflozin, fexofenadine, hydrochlorothiazide, ibuprofen, ketoconazole, metFORMIN, montelukast, omeprazole, pioglitazone, pregabalin, and valACYclovir  History Review  Allergy: Lisa Galvan is allergic to amlodipine besy-benazepril hcl; ivp dye [iodinated diagnostic agents]; shellfish allergy; shellfish-derived products; and ace inhibitors. Drug: Lisa Galvan  reports no history of drug use. Alcohol:  reports current alcohol use of about 2.0 standard drinks of alcohol per week. Tobacco:  reports that she has never smoked. She has never used smokeless tobacco. Social: Lisa Galvan  reports that she has never smoked. She has never used smokeless tobacco. She reports current alcohol use of about 2.0 standard drinks of alcohol per week. She reports that she does not use drugs. Medical:  has a past medical history of Allergy, Arthritis, Bilateral leg edema, Chest pain (12/15/2014), Chronic sciatica (12/10/2014), Chronic sciatica (12/10/2014), Diabetes mellitus without complication ([place]), Essential hypertension (12/10/2014), Essential hypertension (12/10/2014), Gastroesophageal reflux disease without esophagitis, GERD (gastroesophageal reflux disease), Hyperlipidemia, Hypertension, Mixed hyperlipidemia, Obesity (BMI 30.0-34.9) (12/10/2014), and Type 2 diabetes mellitus without complication ([place]) (9/67/8938). Surgical: Ms.  Galvan  has a past surgical history that includes Abdominal hysterectomy; Esophagogastroduodenoscopy (egd) with propofol (N/A, 03/12/2017);  Umbilical hernia repair (N/A, 05/01/2017); Hernia repair; Esophagogastroduodenoscopy (egd) with propofol (N/A, 03/30/2019); and Colonoscopy with propofol (N/A, 03/30/2019). Family: family history includes Breast cancer (age of onset: 82) in her mother; Cancer in her father and mother; Congestive Heart Failure in her maternal grandmother; Diabetes in her father, maternal grandmother, and mother; Heart disease in her father; Hypertension in her brother, maternal grandmother, and sister; Kidney disease in her father. Problem List: Lisa Galvan has DDD (degenerative disc disease), lumbar; Lumbar facet syndrome (Bilateral); Sacroiliac joint dysfunction; Neuropathy due to secondary diabetes ([place]); Bilateral leg edema; Mastalgia in female; Chronic pain syndrome; Chronic low back pain (Third area of Pain) (Bilateral) w/o sciatica; Chronic lower extremity pain (Secondary area of Pain) (Bilateral); Abnormal MRI, lumbar spine (02/08/2019); Grade 1 Anterolisthesis of L4/5; Chronic hip pain (Bilateral) (R>L); Chronic sacroiliac joint pain (Bilateral); Sacroiliac joint somatic dysfunction (Bilateral); Chronic feet pain (Primary Area of Pain) (Bilateral); Chronic leg and foot pain (Bilateral); Diabetic peripheral neuropathy ([place]); Chronic neuropathic pain; Neurogenic pain; Chronic musculoskeletal pain; Osteoarthritis involving multiple joints; Spondylosis without myelopathy or radiculopathy, lumbosacral region; Lumbar Facet Hypertrophy; Abnormal MRI, cervical spine (02/08/2019); Lower extremity weakness (Bilateral); Coordination impairment (lower extremity); Other spondylosis, sacral and sacrococcygeal region; Acute postoperative pain; and Facial swelling on their pertinent problem list.  Laboratory Chemistry Profile   Renal Lab Results  Component Value Date   BUN 10 05/18/2019   CREATININE 0.88 05/18/2019   BCR 11 (L) 05/18/2019   GFRAA 83 05/18/2019   GFRNONAA 72 05/18/2019    Hepatic Lab Results  Component Value Date    AST 14 04/13/2019   ALT 8 04/13/2019   ALBUMIN 4.4 04/13/2019   ALKPHOS 63 04/13/2019    Electrolytes Lab Results  Component Value Date   NA 144 05/18/2019   K 4.0 05/18/2019   CL 101 05/18/2019   CALCIUM 10.0 05/18/2019   MG 1.5 (L) 03/11/2019   PHOS 4.7 (H) 07/03/2017    Bone Lab Results  Component Value Date   VD25OH 50.0 04/13/2019    Inflammation (CRP: Acute Phase) (ESR: Chronic Phase) Lab Results  Component Value Date   CRP <1 02/10/2019   ESRSEDRATE 27 03/11/2019      Note: Above Lab results reviewed.   Recent Imaging Review  DG Ankle Complete Left CLINICAL DATA:  Recent fall with left ankle pain and swelling, initial encounter  EXAM: LEFT ANKLE COMPLETE - 3+ VIEW  COMPARISON:  None.  FINDINGS: There are changes consistent with a bimalleolar fracture. Mild displacement at the distal fibular fracture site is noted. No significant displacement of the medial malleolar fracture is noted. Mild tarsal degenerative changes are seen. Generalized soft tissue swelling is noted.  IMPRESSION: Bimalleolar fracture with significant soft tissue swelling.  Electronically Signed   By: Inez Catalina M.D.   On: 07/17/2019 11:00 Note: Reviewed        Physical Exam  General appearance: Well nourished, well developed, and well hydrated. In no apparent acute distress Mental status: Alert, oriented x 3 (person, place, & time)       Respiratory: No evidence of acute respiratory distress Eyes: PERLA Vitals: BP 124/83   Pulse (!) 101   Temp 98.3 F (36.8 C)   Ht 5' 3" (1.6 m)   Wt 187 lb (84.8 kg)   SpO2 97%   BMI 33.13 kg/m  BMI: Estimated body mass index is 33.13 kg/m as calculated  from the following:   Height as of this encounter: 5' 3" (1.6 m).   Weight as of this encounter: 187 lb (84.8 kg). Ideal: Ideal body weight: 52.4 kg (115 lb 8.3 oz) Adjusted ideal body weight: 65.4 kg (144 lb 1.8 oz)  Assessment   Status Diagnosis  Improving Improved Improved 1.  Facial swelling   2. Chronic low back pain (Third area of Pain) (Bilateral) w/o sciatica   3. Lumbar facet syndrome (Bilateral)      Updated Problems: Problem  Facial Swelling   The patient indicates having some facial swelling that started the day after our last procedure.  On that same day, we had increased her Lyrica from 50 to 75 mg and she thinks that this may have something to do with that swelling.     Plan of Care  Pharmacotherapy (Medications Ordered): No orders of the defined types were placed in this encounter.  Administered today: Lisa Galvan had no medications administered during this visit.  Orders:  No orders of the defined types were placed in this encounter.  Follow-up plan:   Return for regular appointment.  Today the patient was instructed to go down on the Lyrica and stop it so as to find out if it is truly coming from the Lyrica.  The patient has been instructed also to contact her primary care physician for follow-up evaluation on this facial swelling.  In addition, the patient had a fracture of her left ankle, which falls on their "acute pain".  The patient was instructed to also follow-up with her PCP for this.  She was also instructed that should she notice a significant difference in her pain due to having discontinued the Lyrica, she may call on Neurontin, which she indicated she still has some 300 mg pills.  She was instructed to start by taking 1 pill at bedtime and to increase it slowly up to 3 tablets at bedtime (900 mg), as tolerated, if needed.     Interventional management options: Planned, scheduled, and/or pending:    Therapeutic left lumbar facet RFA #1 under fluoroscopic guidance and IV sedation, today.   Considering:   Pending results of EMG/PNCV of the lower extremities Diagnostic bilateral lumbar sympathetic block  Possible bilateral lumbar facet RFA  Diagnostic bilateral IA hip joint injection  Possible bilateral obturator + femoral NB   Possible bilateral obturator + femoral nerve RFA  Diagnostic bilateral SI joint block  Possible bilateral SI joint RFA    PRN Procedures:   Palliative right lumbar facet RFA #2 (last done 06/02/2019) Palliative bilateral lumbar facet block #3  Palliative right sacroiliac joint block #2     Recent Visits Date Type Provider Dept  06/30/19 Procedure visit Milinda Pointer, MD [facility]  06/02/19 Procedure visit Milinda Pointer, MD [facility]  05/06/19 Telemedicine Milinda Pointer, MD [facility]  Showing recent visits within past 90 days and meeting all other requirements   Today's Visits Date Type Provider Dept  07/21/19 Office Visit Milinda Pointer, MD [facility]  Showing today's visits and meeting all other requirements   Future Appointments Date Type Provider Dept  08/17/19 Appointment Milinda Pointer, MD [facility]  Showing future appointments within next 90 days and meeting all other requirements   Note by: Gaspar Cola, MD Date: 07/21/2019; Time: 11:12 AM

## 2019-07-21 NOTE — Patient Instructions (Addendum)
Instructed to stop Lyrica to see if swelling of face goes down.  Instructed to call is there are any increase of swelling or any further questions.

## 2019-07-22 ENCOUNTER — Telehealth: Payer: Self-pay | Admitting: Family Medicine

## 2019-07-22 ENCOUNTER — Ambulatory Visit: Payer: Self-pay | Admitting: General Practice

## 2019-07-22 DIAGNOSIS — S82842A Displaced bimalleolar fracture of left lower leg, initial encounter for closed fracture: Secondary | ICD-10-CM

## 2019-07-22 DIAGNOSIS — R220 Localized swelling, mass and lump, head: Secondary | ICD-10-CM

## 2019-07-22 DIAGNOSIS — M25572 Pain in left ankle and joints of left foot: Secondary | ICD-10-CM | POA: Diagnosis not present

## 2019-07-22 NOTE — Chronic Care Management (AMB) (Signed)
Care Management   Initial Visit Note  07/22/2019 Name: Lisa Galvan MRN: [id number] DOB: [birth year]  Subjective: "I have not help in the home"  Objective:  BP Readings from Last 3 Encounters:  07/21/19 128/78  07/21/19 124/83  07/17/19 139/85    Assessment: Lisa Galvan is a 59 y.o. year old female who sees Dorcas Carrow, DO for primary care. The care management team was consulted for assistance with care management and care coordination needs related to Educational Needs Care Coordination Level of Care Concerns Other Community resources to help the patient as she has to have surgical repair of left ankle fracture.   Review of patient status, including review of consultants reports, relevant laboratory and other test results, and collaboration with appropriate care team members and the patient's provider was performed as part of comprehensive patient evaluation and provision of care management services.    SDOH (Social Determinants of Health) assessments performed: Yes See Care Plan activities for detailed interventions related to [name])     Outpatient Encounter Medications as of 07/22/2019  Medication Sig Note   amLODipine (NORVASC) 5 MG tablet TAKE 1 TABLET(5 MG) BY MOUTH DAILY    atorvastatin (LIPITOR) 20 MG tablet Take 1 tablet (20 mg total) by mouth daily.    Cholecalciferol (VITAMIN D-3) 5000 units TABS Take 5,000 Units by mouth daily.    DULoxetine (CYMBALTA) 60 MG capsule Take 60 mg by mouth daily.    empagliflozin (JARDIANCE) 10 MG TABS tablet Take 10 mg by mouth daily.    EPINEPHrine (EPIPEN 2-PAK) 0.3 mg/0.3 mL IJ SOAJ injection Inject 0.3 mLs (0.3 mg total) into the muscle once. Take for severe allergic reaction, then come immediately to the Emergency Department or call 911.    fexofenadine (ALLEGRA) 180 MG tablet TAKE 1 TABLET BY MOUTH EVERY DAY    hydrochlorothiazide (HYDRODIURIL) 25 MG tablet Take 1 tablet (25 mg total) by mouth daily.     HYDROcodone-acetaminophen (NORCO/VICODIN) 5-325 MG tablet Take 1 tablet by mouth every 6 (six) hours as needed for moderate pain.    ibuprofen (ADVIL) 800 MG tablet TAKE 1 TABLET(800 MG) BY MOUTH EVERY 8 HOURS AS NEEDED (Patient not taking: Reported on 07/21/2019)    ketoconazole (NIZORAL) 2 % cream Apply 1 application topically daily.    Magnesium Oxide 500 MG CAPS Take 1 capsule (500 mg total) by mouth 2 (two) times daily at 8 am and 10 pm.    metFORMIN (GLUCOPHAGE) 500 MG tablet Take 1 tablet (500 mg total) by mouth 2 (two) times daily with a meal.    montelukast (SINGULAIR) 10 MG tablet Take 1 tablet (10 mg total) by mouth at bedtime.    omeprazole (PRILOSEC) 40 MG capsule Take 1 capsule (40 mg total) by mouth daily before breakfast.    pioglitazone (ACTOS) 30 MG tablet TAKE 1 TABLET(30 MG) BY MOUTH DAILY    [START ON 07/29/2019] pregabalin (LYRICA) 100 MG capsule Take 1 capsule (100 mg total) by mouth 2 (two) times daily for 15 days, THEN 1 capsule (100 mg total) 3 (three) times daily for 15 days. 07/21/2019: Pt was unable to tolerated 75mg , cause face to swell   valACYclovir (VALTREX) 500 MG tablet Take 500 mg by mouth 2 (two) times daily as needed (for outbreak).     No facility-administered encounter medications on file as of 07/22/2019.    Goals Addressed            This Visit's Progress    RNCM: Pt-"I do not  have any one to help me bathe" (pt-stated)       CARE PLAN ENTRY (see longtitudinal plan of care for additional care plan information)  Current Barriers:   Knowledge Deficits related to how to obtain help in the home post fall with bimalleolar fracture to the left ankle needing surgical repair  Knowledge Deficits related to facial edema and possible cause due to medications as evidence of reactions in the past from medications   Use of walker in the home due to left ankle fracture  Stress and depression related to current circumstances with fx or ankle in need of  surgical repair  Lacks caregiver support.   Corporate treasurer.   Transportation barriers  Nurse Case Manager Clinical Goal(s):   Over the next 30 days, patient will verbalize understanding of plan for surgical repair of left ankle fracture and needed resources in the home as she recovers from injury/surgery   Over the next 30 days, patient will work with pcp, RNCM and CCM team to address needs related to in home help, community resources, home health and other needs the patient has until she can return to baseline health status  Over the next 60 days, patient will demonstrate a decrease in fall  exacerbations as evidenced by no new falls with injury  Over the next 60 days, patient will attend all scheduled medical appointments: Sees orthopedic provider today for plan of care related to fractured ankle  Over the next 60 days, patient will demonstrate improved adherence to prescribed treatment plan for ankle fracture  as evidenced bysurgical repair of ankle fracture and recovery to baseline status  Over the next 60 days, patient will demonstrate improved health management independence as evidenced byno new issues related to facial edema  Over the next 60 days, patient will work with CM team pharmacist to determine possible medications that can cause facial swelling. The patient recently had an increase in her Lyrica dosage  Over the next 60 days, patient will work with CM clinical social worker to assist with the acute onset of depression and anxiety related to injury and the need for surgical intervention.  The patient does not have help in the home and needs assistance  Over the next 30 days, patient will work with care guides  to assist with resources in the community to help the patient meet her needs  Interventions:   Evaluation of current treatment plan related to ankle fracture and facial edema and patient's adherence to plan as established by provider.  Advised patient to  let the RNCM know of surgical plans and any new needs that may arise  Provided education to patient re: RNCM team involvement to help her meet her health and wellness needs  Reviewed medications with patient and discussed the Lyrica.  The patient  has been taking this medication x 2  months. Did not seem to be having an issue until dosage was increased. Will consult with CCM pharmacist for help  Collaborated with CCM team and pcp  regarding patients immediate needs and concerns  Discussed plans with patient for ongoing care management follow up and provided patient with direct contact information for care management team  Reviewed scheduled/upcoming provider appointments including: Orthopedic appointment today at 1130. Future pcp appointment for discussion of bone density test and other needs.   Care Guide referral for resources in the community to help while the patient is unable to independently care for self  Social Work referral for acute onset of depression and anxiety  related to fall with injury and the need for surgical intervention  Pharmacy referral for medication review to see if medication the patient is currently taking could cause facial edema.  Patient Self Care Activities:   Patient verbalizes understanding of plan to have CCM team work with the patient to meet needs during acute onset of fall with injury and the need for surgical intervention to left ankle  Attends all scheduled provider appointments  Performs ADL's independently  Performs IADL's independently  Calls provider office for new concerns or questions  Unable to independently care for self post fall with injury resulting in ankle fracture needing surgical intervention  Lacks social connections  Unable to perform ADLs independently  Unable to perform IADLs independently  Initial goal documentation         Follow up plan:  The care management team will reach out to the patient again over the next  30 days.   Ms. Symmonds was given information about Care Management services today including:  1. Care Management services include personalized support from designated clinical staff supervised by a physician, including individualized plan of care and coordination with other care providers 2. 24/7 contact phone numbers for assistance for urgent and routine care needs. 3. The patient may stop Care Management services at any time (effective at the end of the month) by phone call to the office staff.  Patient agreed to services and verbal consent obtained.  [name] RN, MSN, [facility] Mobile: [phone]

## 2019-07-22 NOTE — Telephone Encounter (Signed)
Pt states she called in a prescription that was supposed be sent over to pharmacy and its still not there . Please advise and call pt back

## 2019-07-22 NOTE — Telephone Encounter (Signed)
Called and LVM asking for patient to please return my call.  

## 2019-07-22 NOTE — Patient Instructions (Signed)
Visit Information  Goals Addressed            This Visit's Progress   . RNCM: Pt-"I do not have any one to help me bathe" (pt-stated)       CARE PLAN ENTRY (see longtitudinal plan of care for additional care plan information)  Current Barriers:  Marland Kitchen Knowledge Deficits related to how to obtain help in the home post fall with bimalleolar fracture to the left ankle needing surgical repair . Knowledge Deficits related to facial edema and possible cause due to medications as evidence of reactions in the past from medications  . Use of walker in the home due to left ankle fracture . Stress and depression related to current circumstances with fx or ankle in need of surgical repair . Lacks caregiver support.  . Film/video editor.  . Transportation barriers  Nurse Case Manager Clinical Goal(s):  Marland Kitchen Over the next 30 days, patient will verbalize understanding of plan for surgical repair of left ankle fracture and needed resources in the home as she recovers from injury/surgery  . Over the next 30 days, patient will work with pcp, RNCM and CCM team to address needs related to in home help, community resources, home health and other needs the patient has until she can return to baseline health status . Over the next 60 days, patient will demonstrate a decrease in fall  exacerbations as evidenced by no new falls with injury . Over the next 60 days, patient will attend all scheduled medical appointments: Sees orthopedic provider today for plan of care related to fractured ankle . Over the next 60 days, patient will demonstrate improved adherence to prescribed treatment plan for ankle fracture  as evidenced bysurgical repair of ankle fracture and recovery to baseline status . Over the next 60 days, patient will demonstrate improved health management independence as evidenced byno new issues related to facial edema . Over the next 60 days, patient will work with CM team pharmacist to determine possible  medications that can cause facial swelling. The patient recently had an increase in her Lyrica dosage . Over the next 60 days, patient will work with CM clinical social worker to assist with the acute onset of depression and anxiety related to injury and the need for surgical intervention.  The patient does not have help in the home and needs assistance . Over the next 30 days, patient will work with care guides  to assist with resources in the community to help the patient meet her needs  Interventions:  . Evaluation of current treatment plan related to ankle fracture and facial edema and patient's adherence to plan as established by provider. . Advised patient to let the RNCM know of surgical plans and any new needs that may arise . Provided education to patient re: RNCM team involvement to help her meet her health and wellness needs . Reviewed medications with patient and discussed the Lyrica.  The patient  has been taking this medication x 2  months. Did not seem to be having an issue until dosage was increased. Will consult with CCM pharmacist for help . Collaborated with CCM team and pcp  regarding patients immediate needs and concerns . Discussed plans with patient for ongoing care management follow up and provided patient with direct contact information for care management team . Reviewed scheduled/upcoming provider appointments including: Orthopedic appointment today at 1130. Future pcp appointment for discussion of bone density test and other needs.  . Care Guide referral for resources in  the community to help while the patient is unable to independently care for self . Social Work referral for acute onset of depression and anxiety related to fall with injury and the need for surgical intervention . Pharmacy referral for medication review to see if medication the patient is currently taking could cause facial edema.  Patient Self Care Activities:  . Patient verbalizes understanding of plan  to have CCM team work with the patient to meet needs during acute onset of fall with injury and the need for surgical intervention to left ankle . Attends all scheduled provider appointments . Performs ADL's independently . Performs IADL's independently . Calls provider office for new concerns or questions . Unable to independently care for self post fall with injury resulting in ankle fracture needing surgical intervention . Lacks social connections . Unable to perform ADLs independently . Unable to perform IADLs independently  Initial goal documentation        Ms. Chrismer was given information about Care Management services today including:  1. Care Management services include personalized support from designated clinical staff supervised by her physician, including individualized plan of care and coordination with other care providers 2. 24/7 contact phone numbers for assistance for urgent and routine care needs. 3. The patient may stop CCM services at any time (effective at the end of the month) by phone call to the office staff.  Patient agreed to services and verbal consent obtained.   Patient verbalizes understanding of instructions provided today.   The care management team will reach out to the patient again over the next 30 days.   Alto Denver RN, MSN, CCM Community Care Coordinator [place]  [facility] Mobile: [phone]

## 2019-07-22 NOTE — Telephone Encounter (Signed)
Patient returned my call. States she would need RX sent to the pharmacy, verified pharmacy in chart.

## 2019-07-22 NOTE — Telephone Encounter (Signed)
Hi Dr. Shireen Quan, Kaycie was talking to our nurse care coordinator today and mentioned that she has been having increased swelling on her higher dose of lyrica. I advised her to cut back down to what she was taking. However, it looks like you're writing it for her, so I thought I would let you know. Thanks!

## 2019-07-22 NOTE — Telephone Encounter (Signed)
Having swelling on her lyrica on higher dose- please have her go back to lower dose. Please let me know if she needs new Rx

## 2019-07-22 NOTE — Telephone Encounter (Signed)
I see that Dr. Shireen Quan writes that. Will send him a message.

## 2019-07-23 ENCOUNTER — Other Ambulatory Visit: Payer: Self-pay

## 2019-07-23 ENCOUNTER — Other Ambulatory Visit: Payer: Self-pay | Admitting: Orthopedic Surgery

## 2019-07-23 ENCOUNTER — Telehealth: Payer: Self-pay | Admitting: Family Medicine

## 2019-07-23 ENCOUNTER — Encounter: Payer: Self-pay | Admitting: Orthopedic Surgery

## 2019-07-23 NOTE — Telephone Encounter (Signed)
Lisa Galvan community outreach called in to assist pt with referral. Pt states that she has not heard anything yet. Referral status shows closed and not sure why. Please look into this.

## 2019-07-23 NOTE — Telephone Encounter (Signed)
   K[facility] 07/23/2019   Name: Lisa Galvan   MRN: [id number]   DOB: June 03, 1960   AGE: 59 y.o.   GENDER: female   PCP Olevia Perches P, DO.   Called pt regarding [employer] Referral for food as she has fallen and broken her ankle surgery scheduled for 4/8 Pt wanted to know why she had not heard back about Home Health referral. It was closed by [employer] agent on 3/31. Called Joliza at [facility] and she will route to referral coordinator to follow up. Asked pt to wait for c/b and if she has not heard anything to c/b tomorrow.  Pt stated that her neighbor had prepared a lot of meals and she can heat them up. Declined [employer] at this time. Gave my direct # should she change her mind in the future or after surgery. Will wait to see what home health can provide with bathing, etc.  Manuela Schwartz  Care Guide . Embedded Care Coordination [facility] Management Samara [email or web address]  [phone]

## 2019-07-23 NOTE — Telephone Encounter (Signed)
Patient returned call and was notified of Dr. Henriette Combs message. States she was going to take 2 25 mg tablets that she has. Also states that she does not have anything less than 600 gabapentin and states that she is not taking that.

## 2019-07-23 NOTE — Telephone Encounter (Signed)
Called and left patient a VM asking for her to please return my call.  

## 2019-07-23 NOTE — Telephone Encounter (Signed)
She may want to call Dr. Chesley Mires office to move things along

## 2019-07-23 NOTE — Telephone Encounter (Signed)
It's written by Dr. Shireen Quan and it's a controlled substance, so I sent a message to him, but I don't want to break her pain agreement, so I have not sent it in. See other TE where this was fully discussed.

## 2019-07-23 NOTE — Telephone Encounter (Signed)
Routing to provider. I spoke with patient yesterday about decreasing the Lyrica. Does not look like the decreased dose was sent in yet.

## 2019-07-27 ENCOUNTER — Telehealth: Payer: Self-pay | Admitting: Family Medicine

## 2019-07-27 NOTE — Telephone Encounter (Signed)
Spoke with pt she has not heard back about Home Health Referral that was closed. Transferred to [facility]

## 2019-07-27 NOTE — Telephone Encounter (Signed)
Patient instructed to test theory by tapering Lyrica off and waiting two weeks. If swelling goes away, it is likely the Lyrica was the contributor.

## 2019-07-28 ENCOUNTER — Other Ambulatory Visit
Admission: RE | Admit: 2019-07-28 | Discharge: 2019-07-28 | Disposition: A | Discharge status: Home / Self Care | Payer: BLUE CROSS/BLUE SHIELD | Source: Ambulatory Visit | Attending: Orthopedic Surgery | Admitting: Orthopedic Surgery

## 2019-07-28 DIAGNOSIS — Z01812 Encounter for preprocedural laboratory examination: Secondary | ICD-10-CM | POA: Insufficient documentation

## 2019-07-28 DIAGNOSIS — Z20822 Contact with and (suspected) exposure to covid-19: Secondary | ICD-10-CM | POA: Insufficient documentation

## 2019-07-28 LAB — SARS CORONAVIRUS 2 (TAT 6-24 HRS): SARS Coronavirus 2: NEGATIVE

## 2019-07-30 ENCOUNTER — Ambulatory Visit: Payer: BLUE CROSS/BLUE SHIELD | Admitting: Anesthesiology

## 2019-07-30 ENCOUNTER — Encounter: Payer: Self-pay | Admitting: Orthopedic Surgery

## 2019-07-30 ENCOUNTER — Ambulatory Visit
Admission: RE | Admit: 2019-07-30 | Discharge: 2019-07-30 | Disposition: A | Discharge status: Home / Self Care | Payer: BLUE CROSS/BLUE SHIELD | Attending: Orthopedic Surgery | Admitting: Orthopedic Surgery

## 2019-07-30 ENCOUNTER — Encounter
Admission: RE | Disposition: A | Discharge status: Home / Self Care | Payer: Self-pay | Source: Home / Self Care | Attending: Orthopedic Surgery

## 2019-07-30 ENCOUNTER — Ambulatory Visit: Payer: BLUE CROSS/BLUE SHIELD

## 2019-07-30 ENCOUNTER — Other Ambulatory Visit: Payer: Self-pay

## 2019-07-30 DIAGNOSIS — W19XXXA Unspecified fall, initial encounter: Secondary | ICD-10-CM | POA: Insufficient documentation

## 2019-07-30 DIAGNOSIS — Z7984 Long term (current) use of oral hypoglycemic drugs: Secondary | ICD-10-CM | POA: Insufficient documentation

## 2019-07-30 DIAGNOSIS — I10 Essential (primary) hypertension: Secondary | ICD-10-CM | POA: Diagnosis not present

## 2019-07-30 DIAGNOSIS — M25572 Pain in left ankle and joints of left foot: Secondary | ICD-10-CM | POA: Diagnosis not present

## 2019-07-30 DIAGNOSIS — S82842A Displaced bimalleolar fracture of left lower leg, initial encounter for closed fracture: Secondary | ICD-10-CM | POA: Insufficient documentation

## 2019-07-30 DIAGNOSIS — E785 Hyperlipidemia, unspecified: Secondary | ICD-10-CM | POA: Diagnosis not present

## 2019-07-30 DIAGNOSIS — G8918 Other acute postprocedural pain: Secondary | ICD-10-CM | POA: Diagnosis not present

## 2019-07-30 DIAGNOSIS — E1142 Type 2 diabetes mellitus with diabetic polyneuropathy: Secondary | ICD-10-CM | POA: Diagnosis not present

## 2019-07-30 DIAGNOSIS — K219 Gastro-esophageal reflux disease without esophagitis: Secondary | ICD-10-CM | POA: Insufficient documentation

## 2019-07-30 DIAGNOSIS — Z888 Allergy status to other drugs, medicaments and biological substances status: Secondary | ICD-10-CM | POA: Insufficient documentation

## 2019-07-30 DIAGNOSIS — Z79899 Other long term (current) drug therapy: Secondary | ICD-10-CM | POA: Insufficient documentation

## 2019-07-30 DIAGNOSIS — Z91041 Radiographic dye allergy status: Secondary | ICD-10-CM | POA: Diagnosis not present

## 2019-07-30 HISTORY — PX: ORIF ANKLE FRACTURE: SHX5408

## 2019-07-30 LAB — GLUCOSE, CAPILLARY
Glucose-Capillary: 100 mg/dL — ABNORMAL HIGH (ref 70–99)
Glucose-Capillary: 106 mg/dL — ABNORMAL HIGH (ref 70–99)

## 2019-07-30 SURGERY — OPEN REDUCTION INTERNAL FIXATION (ORIF) ANKLE FRACTURE
Anesthesia: General | Site: Ankle | Laterality: Left

## 2019-07-30 MED ORDER — OXYCODONE HCL 5 MG/5ML PO SOLN: 5.0000 mg | Freq: Once | ORAL | Status: AC | PRN

## 2019-07-30 MED ORDER — ASPIRIN EC 325 MG PO TBEC: 325.0000 mg | DELAYED_RELEASE_TABLET | Freq: Every day | ORAL | 0 refills | Status: AC

## 2019-07-30 MED ORDER — CEFAZOLIN SODIUM-DEXTROSE 2-4 GM/100ML-% IV SOLN: 2.0000 g | INTRAVENOUS | Status: DC

## 2019-07-30 MED ORDER — ACETAMINOPHEN 500 MG PO TABS: 1000.0000 mg | ORAL_TABLET | Freq: Three times a day (TID) | ORAL | 2 refills | Status: AC

## 2019-07-30 MED ORDER — ROPIVACAINE HCL 5 MG/ML IJ SOLN
INTRAMUSCULAR | Status: DC | PRN
  Administered 2019-07-30: 15 mL via EPIDURAL
  Administered 2019-07-30: 25 mL via EPIDURAL

## 2019-07-30 MED ORDER — LACTATED RINGERS IV SOLN
100.0000 mL/h | INTRAVENOUS | Status: DC
  Administered 2019-07-30: 100 mL/h via INTRAVENOUS

## 2019-07-30 MED ORDER — DIPHENHYDRAMINE HCL 50 MG/ML IJ SOLN
INTRAMUSCULAR | Status: DC | PRN
  Administered 2019-07-30: 25 mg via INTRAVENOUS

## 2019-07-30 MED ORDER — LIDOCAINE HCL (CARDIAC) PF 100 MG/5ML IV SOSY
PREFILLED_SYRINGE | INTRAVENOUS | Status: DC | PRN
  Administered 2019-07-30: 20 mg via INTRAVENOUS

## 2019-07-30 MED ORDER — OXYCODONE HCL 5 MG PO TABS: 5.0000 mg | ORAL_TABLET | ORAL | 0 refills | Status: DC | PRN

## 2019-07-30 MED ORDER — ONDANSETRON 4 MG PO TBDP: 4.0000 mg | ORAL_TABLET | Freq: Three times a day (TID) | ORAL | 0 refills | Status: DC | PRN

## 2019-07-30 MED ORDER — ONDANSETRON HCL 4 MG/2ML IJ SOLN
INTRAMUSCULAR | Status: DC | PRN
  Administered 2019-07-30: 4 mg via INTRAVENOUS

## 2019-07-30 MED ORDER — PROPOFOL 500 MG/50ML IV EMUL
INTRAVENOUS | Status: DC | PRN
  Administered 2019-07-30: 75 ug/kg/min via INTRAVENOUS

## 2019-07-30 MED ORDER — HYDROMORPHONE HCL 1 MG/ML IJ SOLN
0.2500 mg | INTRAMUSCULAR | Status: DC | PRN
  Administered 2019-07-30: 16:00:00 0.3 mg via INTRAVENOUS
  Administered 2019-07-30: 16:00:00 0.4 mg via INTRAVENOUS
  Administered 2019-07-30: 0.3 mg via INTRAVENOUS

## 2019-07-30 MED ORDER — MIDAZOLAM HCL 2 MG/2ML IJ SOLN
INTRAMUSCULAR | Status: DC | PRN
  Administered 2019-07-30: 2 mg via INTRAVENOUS
  Administered 2019-07-30 (×2): 1 mg via INTRAVENOUS

## 2019-07-30 MED ORDER — OXYCODONE HCL 5 MG PO TABS
5.0000 mg | ORAL_TABLET | Freq: Once | ORAL | Status: AC | PRN
  Administered 2019-07-30: 5 mg via ORAL

## 2019-07-30 MED ORDER — FENTANYL CITRATE (PF) 100 MCG/2ML IJ SOLN
INTRAMUSCULAR | Status: DC | PRN
  Administered 2019-07-30: 25 ug via INTRAVENOUS
  Administered 2019-07-30: 100 ug via INTRAVENOUS
  Administered 2019-07-30 (×3): 25 ug via INTRAVENOUS

## 2019-07-30 MED ORDER — MEPERIDINE HCL 25 MG/ML IJ SOLN: 6.2500 mg | INTRAMUSCULAR | Status: DC | PRN

## 2019-07-30 MED ORDER — GLYCOPYRROLATE 0.2 MG/ML IJ SOLN
INTRAMUSCULAR | Status: DC | PRN
  Administered 2019-07-30 (×2): .1 mg via INTRAVENOUS

## 2019-07-30 MED ORDER — PROPOFOL 10 MG/ML IV BOLUS
INTRAVENOUS | Status: DC | PRN
  Administered 2019-07-30: 40 mg via INTRAVENOUS

## 2019-07-30 MED ORDER — PROMETHAZINE HCL 25 MG/ML IJ SOLN: 6.2500 mg | INTRAMUSCULAR | Status: DC | PRN

## 2019-07-30 SURGICAL SUPPLY — 54 items
1.4mm wire ×6 IMPLANT
1.6mm K-WIRE ×9 IMPLANT
2.0mm drill ×3 IMPLANT
2.7MM CANNULATED DRILL ×3 IMPLANT
BLADE SURG SZ10 CARB STEEL (BLADE) ×6 IMPLANT
BNDG COHESIVE 4X5 TAN STRL (GAUZE/BANDAGES/DRESSINGS) ×3 IMPLANT
BNDG ESMARK 4X12 TAN STRL LF (GAUZE/BANDAGES/DRESSINGS) ×3 IMPLANT
CANISTER SUCT 1200ML W/VALVE (MISCELLANEOUS) ×3 IMPLANT
CHLORAPREP W/TINT 26 (MISCELLANEOUS) ×3 IMPLANT
CUFF TOURN SGL QUICK 30 (TOURNIQUET CUFF) ×2
CUFF TRNQT CYL 30X4X21-28X (TOURNIQUET CUFF) ×1 IMPLANT
DRAPE C-ARM XRAY 36X54 (DRAPES) ×3 IMPLANT
DRAPE C-ARMOR (DRAPES) ×3 IMPLANT
DRAPE IMP U-DRAPE 54X76 (DRAPES) ×3 IMPLANT
DRILL 2.6X122MM WL AO SHAFT (BIT) ×3 IMPLANT
ELECT REM PT RETURN 9FT ADLT (ELECTROSURGICAL) ×3
ELECTRODE REM PT RTRN 9FT ADLT (ELECTROSURGICAL) ×1 IMPLANT
GAUZE SPONGE 4X4 12PLY STRL (GAUZE/BANDAGES/DRESSINGS) ×3 IMPLANT
GAUZE XEROFORM 1X8 LF (GAUZE/BANDAGES/DRESSINGS) ×3 IMPLANT
GLOVE BIO SURGEON STRL SZ7.5 (GLOVE) ×3 IMPLANT
GLOVE BIOGEL PI IND STRL 8 (GLOVE) ×1 IMPLANT
GLOVE BIOGEL PI INDICATOR 8 (GLOVE) ×2
GOWN STRL REIN 2XL XLG LVL4 (GOWN DISPOSABLE) ×3 IMPLANT
GOWN STRL REUS W/ TWL LRG LVL3 (GOWN DISPOSABLE) ×1 IMPLANT
GOWN STRL REUS W/TWL LRG LVL3 (GOWN DISPOSABLE) ×2
KIT TURNOVER KIT A (KITS) ×3 IMPLANT
NS IRRIG 500ML POUR BTL (IV SOLUTION) ×3 IMPLANT
PACK EXTREMITY ARMC (MISCELLANEOUS) ×3 IMPLANT
PADDING CAST 4IN STRL (MISCELLANEOUS) ×2
PADDING CAST BLEND 4X4 NS (MISCELLANEOUS) ×9 IMPLANT
PADDING CAST BLEND 4X4 STRL (MISCELLANEOUS) ×1 IMPLANT
PADDING CAST BLEND 6X4 STRL (MISCELLANEOUS) ×1 IMPLANT
PADDING STRL CAST 6IN (MISCELLANEOUS) ×2
PENCIL SMOKE EVACUATOR (MISCELLANEOUS) ×3 IMPLANT
PLATE FIBULA 4H (Plate) ×3 IMPLANT
SCREW 3.5X10MM (Screw) ×3 IMPLANT
SCREW BONE 14MMX3.5MM (Screw) ×3 IMPLANT
SCREW BONE 20 X2.7 (Screw) ×6 IMPLANT
SCREW BONE NON-LCKING 3.5X12MM (Screw) ×6 IMPLANT
SCREW CAN P/T ANKLE 4.0X50MM (Screw) ×6 IMPLANT
SCREW LOCK 3.5X14 (Screw) ×3 IMPLANT
SCREW LOCKING 3.5X12 (Screw) ×9 IMPLANT
SCREW LOCKING 3.5X16MM (Screw) ×3 IMPLANT
SPLINT FAST PLASTER 5X30 (CAST SUPPLIES) ×2
SPLINT PLASTER CAST FAST 5X30 (CAST SUPPLIES) ×1 IMPLANT
SPONGE LAP 18X18 RF (DISPOSABLE) ×3 IMPLANT
STAPLER SKIN PROX 35W (STAPLE) ×3 IMPLANT
STOCKINETTE STRL 6IN 960660 (GAUZE/BANDAGES/DRESSINGS) ×3 IMPLANT
SUT VIC AB 0 CT1 36 (SUTURE) ×3 IMPLANT
SUT VIC AB 3-0 SH 27 (SUTURE) ×4
SUT VIC AB 3-0 SH 27X BRD (SUTURE) ×2 IMPLANT
TOWEL OR 17X26 4PK STRL BLUE (TOWEL DISPOSABLE) ×3 IMPLANT
WASHER ASNIS III 4.0 SCR (Washer) ×6 IMPLANT
olive wire ×3 IMPLANT

## 2019-07-30 NOTE — H&P (Signed)
Paper H&P to be scanned into permanent record. H&P reviewed. No significant changes noted.  

## 2019-07-30 NOTE — Anesthesia Preprocedure Evaluation (Addendum)
Anesthesia Evaluation  Patient identified by MRN, date of birth, ID band Patient awake    Reviewed: Allergy & Precautions, NPO status , Patient's Chart, lab work & pertinent test results  History of Anesthesia Complications Negative for: history of anesthetic complications  Airway Mallampati: III  TM Distance: >3 FB Neck ROM: Full    Dental   Pulmonary    breath sounds clear to auscultation       Cardiovascular hypertension, (-) angina(-) DOE  Rhythm:Regular Rate:Normal   HLD   Neuro/Psych  Parkinson's  Neuromuscular disease (Sciatica, peripheral neuropathy)    GI/Hepatic GERD  Controlled,  Endo/Other  diabetes  Renal/GU      Musculoskeletal  (+) Arthritis ,   Abdominal (+) + obese (BMI 32),   Peds  Hematology   Anesthesia Other Findings   Reproductive/Obstetrics                            Anesthesia Physical Anesthesia Plan  ASA: III  Anesthesia Plan: General   Post-op Pain Management:  Regional for Post-op pain   Induction: Intravenous  PONV Risk Score and Plan: 3 and Propofol infusion, TIVA and Treatment may vary due to age or medical condition  Airway Management Planned: Natural Airway and Nasal Cannula  Additional Equipment:   Intra-op Plan:   Post-operative Plan:   Informed Consent: I have reviewed the patients History and Physical, chart, labs and discussed the procedure including the risks, benefits and alternatives for the proposed anesthesia with the patient or authorized representative who has indicated his/her understanding and acceptance.       Plan Discussed with: CRNA and Anesthesiologist  Anesthesia Plan Comments:         Anesthesia Quick Evaluation

## 2019-07-30 NOTE — Anesthesia Procedure Notes (Signed)
Procedure Name: MAC Date/Time: 07/30/2019 1:05 PM Performed by: Vanetta Shawl, CRNA Pre-anesthesia Checklist: Patient identified, Emergency Drugs available, Suction available, Timeout performed and Patient being monitored Patient Re-evaluated:Patient Re-evaluated prior to induction Oxygen Delivery Method: Simple face mask Induction Type: IV induction Placement Confirmation: positive ETCO2

## 2019-07-30 NOTE — Anesthesia Postprocedure Evaluation (Signed)
Anesthesia Post Note  Patient: Lisa Galvan  Procedure(s) Performed: OPEN REDUCTION INTERNAL FIXATION (ORIF) OF LEFT BIMALLEOLAR ANKLE FRACTURE (Left Ankle)     Patient location during evaluation: PACU Anesthesia Type: General Level of consciousness: awake and alert Pain management: pain level controlled Vital Signs Assessment: post-procedure vital signs reviewed and stable Respiratory status: spontaneous breathing, nonlabored ventilation, respiratory function stable and patient connected to nasal cannula oxygen Cardiovascular status: blood pressure returned to baseline and stable Postop Assessment: no apparent nausea or vomiting Anesthetic complications: no    Cale Decarolis A  Nicolis Boody

## 2019-07-30 NOTE — Discharge Instructions (Addendum)
Ankle Fracture Surgery  Post-Op Instructions  1. Bracing or crutches: Crutches will be provided at the time of discharge.  2. Splint/Cast: You will have a splint (3/4 cast) on your leg after surgery. Ensure that this remains clean and dry until follow up appointment. If this becomes wet, you need to call our offices to get it changed or else you risk skin breakdown.    3. Driving:  Plan on not driving for at least four to six weeks. Please note that you are advised NOT to drive while taking narcotic pain medications as you may be impaired and unsafe to drive.  4. Activity: Ankle pumps several times an hour while awake to prevent blood clots. Weight bearing: Non-weight bearing. Use crutches or walker for at least 6 weeks, if not longer based on your surgery. Bending and straightening the knee is unlimited. Elevate knee above heart level as much as possible for one week. Avoid standing more than 5 minutes (consecutively) for the first week.  Avoid long distance travel for 4 weeks.  5. Medications:  - You have been provided a prescription for narcotic pain medicine. After surgery, take 1-2 narcotic tablets every 4 hours if needed for severe pain. Please start this as soon as you begin to start having pain (if you received a nerve block, start taking as soon as this wears off).  - A prescription for anti-nausea medication will be provided in case the narcotic medicine causes nausea - take 1 tablet every 6 hours only if nauseated.  - Take enteric coated aspirin 325 mg once daily for 4 weeks to prevent blood clots.  -Take tylenol 1000 mg every 8 hours for pain.  May stop tylenol 5 days after surgery if you are having minimal pain.  If you are taking prescription medication for anxiety, depression, insomnia, muscle spasm, chronic pain, or for attention deficit disorder you are advised that you are at a higher risk of adverse effects with use of narcotics post-op, including narcotic  addiction/dependence, depressed breathing, death. If you use non-prescribed substances: alcohol, marijuana, cocaine, heroin, methamphetamines, etc., you are at a higher risk of adverse effects with use of narcotics post-op, including narcotic addiction/dependence, depressed breathing, death. You are advised that taking > 50 morphine milligram equivalents (MME) of narcotic pain medication per day results in twice the risk of overdose or death. For your prescription provided: oxycodone 5 mg - taking more than 6 tablets per day. Be advised that we will prescribe narcotics short-term, for acute post-operative pain only - 1 week for minor operations such as knee arthroscopy for meniscus tear resection, and 3 weeks for major operations such as knee repair/reconstruction surgeries.   6. Physical Therapy: Plan to start after follow up appointment at 2 weeks. 1-2 times per week for ~12-16 weeks.  You have been provided an order for physical therapy.  7. Work/School: May return to full work when off of crutches and able to tolerate all aspects of work. May do light duty/desk job or return to school in approximately 1-2 weeks when off of narcotics, pain is well-controlled, and swelling has decreased.  8. Post-Op Appointments: Your first post-op appointment will be with Dr. Posey Pronto in approximately 2 weeks time.   If you find that they have not been scheduled please call the [facility] front desk at [phone].       General Anesthesia, Adult, Care After This sheet gives you information about how to care for yourself after your procedure. Your health care provider  may also give you more specific instructions. If you have problems or questions, contact your health care provider. What can I expect after the procedure? After the procedure, the following side effects are common:  Pain or discomfort at the IV site.  Nausea.  Vomiting.  Sore throat.  Trouble concentrating.  Feeling  cold or chills.  Weak or tired.  Sleepiness and fatigue.  Soreness and body aches. These side effects can affect parts of the body that were not involved in surgery. Follow these instructions at home:  For at least 24 hours after the procedure:  Have a responsible adult stay with you. It is important to have someone help care for you until you are awake and alert.  Rest as needed.  Do not: ? Participate in activities in which you could fall or become injured. ? Drive. ? Use heavy machinery. ? Drink alcohol. ? Take sleeping pills or medicines that cause drowsiness. ? Make important decisions or sign legal documents. ? Take care of children on your own. Eating and drinking  Follow any instructions from your health care provider about eating or drinking restrictions.  When you feel hungry, start by eating small amounts of foods that are soft and easy to digest (bland), such as toast. Gradually return to your regular diet.  Drink enough fluid to keep your urine pale yellow.  If you vomit, rehydrate by drinking water, juice, or clear broth. General instructions  If you have sleep apnea, surgery and certain medicines can increase your risk for breathing problems. Follow instructions from your health care provider about wearing your sleep device: ? Anytime you are sleeping, including during daytime naps. ? While taking prescription pain medicines, sleeping medicines, or medicines that make you drowsy.  Return to your normal activities as told by your health care provider. Ask your health care provider what activities are safe for you.  Take over-the-counter and prescription medicines only as told by your health care provider.  If you smoke, do not smoke without supervision.  Keep all follow-up visits as told by your health care provider. This is important. Contact a health care provider if:  You have nausea or vomiting that does not get better with medicine.  You cannot eat  or drink without vomiting.  You have pain that does not get better with medicine.  You are unable to pass urine.  You develop a skin rash.  You have a fever.  You have redness around your IV site that gets worse. Get help right away if:  You have difficulty breathing.  You have chest pain.  You have blood in your urine or stool, or you vomit blood. Summary  After the procedure, it is common to have a sore throat or nausea. It is also common to feel tired.  Have a responsible adult stay with you for the first 24 hours after general anesthesia. It is important to have someone help care for you until you are awake and alert.  When you feel hungry, start by eating small amounts of foods that are soft and easy to digest (bland), such as toast. Gradually return to your regular diet.  Drink enough fluid to keep your urine pale yellow.  Return to your normal activities as told by your health care provider. Ask your health care provider what activities are safe for you. This information is not intended to replace advice given to you by your health care provider. Make sure you discuss any questions you have with your  health care provider. Document Revised: 04/12/2017 Document Reviewed: 11/23/2016 Elsevier Patient Education  2020 [employer].    Crutch Use, Adult Crutches are used to take weight off of one of your legs or feet when you stand or walk. You may need crutches to help you heal after an injury or procedure. It is important to use crutches that fit right. Your crutches fit right if:  You can fit two or three fingers between your armpit and the crutch.  You use your hands, not your armpits, to hold yourself up. It is important that a doctor has seen you use crutches the right way before you use them at home. What are the risks? Using crutches the wrong way can hurt your shoulders, arms, back, armpits, wrists, and hands. To avoid this:  Make sure your crutches fit  right.  Do not put pressure on your armpits when using the crutches. While using crutches, you also have a higher risk of falling. To avoid this:  Move objects away from the floor or area where you walk when possible. Get help as needed.  Keep walkways well lit.  Use a backpack to carry items. How to use your crutches How you use your crutches will depend on why you need them. Your doctor may tell you not to support your body weight (bear weight) on your hurt leg. Or your doctor may let you put some, but not all, of your weight on the hurt leg. Follow instructions from your doctor about putting weight on your leg. Do not put weight on your leg in an amount that causes pain. Walking  1. Stand on your good leg and lift both crutches at the same time. 2. Place the crutches one step-length in front of you. Keep your weight over the hand grips. 3. Bring the good leg forward to meet the crutches or to land a little bit ahead of them. 4. Repeat. Going up steps  If there is no handrail: 1. Walk up to steps and put weight on hand grips to step up. 2. Step up with your good leg. 3. Step up with the crutches and your hurt leg. 4. Repeat. If there is a handrail: 1. Hold both crutches in one hand. 2. Place your other hand on the handrail. 3. Put your weight on your arms and lift your good leg up to the step. 4. Bring the crutches and the hurt leg up to that step. 5. Repeat. If you do not feel steady on steps, you can go up steps on your butt. 1. Sit on the lowest step. ? Have your hurt leg out in front. ? Hold both crutches flat on the stairs in one hand. 2. Scoot your butt up to the next step. Use your free hand and your good leg to help you do this.  Going down steps If there is no handrail: 1. Step down with your hurt leg and crutches. Keep the crutch tips in the center of the step. Do not put crutch tips close to the edge of the step. 2. Step down with your good leg. 3. Repeat. If  there is a handrail: 1. Place one hand on the handrail. 2. Hold both crutches with your free hand. 3. Lower your hurt leg and crutches to the step below you. Keep the crutch tips in the center of the step. Do not put the crutch tips close to the edge of the step. 4. Lower your good leg to the next step. 5.  Repeat. If you do not feel steady on steps, you can go down steps on your butt. 1. Sit on the highest step. ? Have your hurt leg out in front. ? Use your other hand to hold both crutches flat on the stairs. 2. Scoot your butt down to the next step. Use the free hand and your good leg to help you do this.  Standing up  Move to the edge of the seat. If there is an armrest: 1. Hold the hurt leg forward. 2. Grab the armrest with one hand. Use the other hand to grab the top of the crutches. 3. Use the armrest and your crutches to pull yourself up to stand. If there is no armrest: 1. Hold the hurt leg forward. 2. Hold on to the seat with one hand. Use the other hand to hold the top of the crutches. 3. Use the seat and your crutches to bring yourself up to stand. Sitting down  Move back until your leg touches the edge of the seat. If there is an armrest: 1. Hold the hurt leg forward. 2. Grab the armrest with one hand. Use the other hand to grab the top of the crutches. 3. Slowly lower yourself to sit. If there is no armrest: 1. Hold the hurt leg forward. 2. Reach for and hold on to the seat with one hand. Use the other hand to hold on to the top of the crutches. 3. Slowly lower yourself to sit. Get help if:  You feel unsteady using crutches.  You have any new pain.  You lose feeling (feel numb) or you have a tingling feeling.  Your crutches do not fit. Get help right away if:  You fall. Summary  Crutches are used to take weight off of a leg or foot when you stand or walk.  Make sure your crutches fit right, and do not put pressure on your armpits when using the  crutches.  Follow instructions from your doctor about putting weight on your hurt leg. This information is not intended to replace advice given to you by your health care provider. Make sure you discuss any questions you have with your health care provider. Document Revised: 10/29/2018 Document Reviewed: 10/29/2018 Elsevier Patient Education  2020 [employer].   Copyright  V[employer]. All rights reserved.

## 2019-07-30 NOTE — Anesthesia Procedure Notes (Signed)
Anesthesia Regional Block: Adductor canal block   Pre-Anesthetic Checklist: ,, timeout performed, Correct Patient, Correct Site, Correct Laterality, Correct Procedure, Correct Position, site marked, Risks and benefits discussed,  Surgical consent,  Pre-op evaluation,  At surgeon's request and post-op pain management  Laterality: Left  Prep: chloraprep       Needles:  Injection technique: Single-shot  Needle Type: Echogenic Needle     Needle Length: 9cm  Needle Gauge: 21     Additional Needles:   Procedures:,,,, ultrasound used (permanent image in chart),,,,  Narrative:  Injection made incrementally with aspirations every 5 mL.  Performed by: Personally  Anesthesiologist: Shelli Portilla A, MD  Additional Notes: Functioning IV was confirmed and monitors applied. Ultrasound guidance: relevant anatomy identified, needle position confirmed, local anesthetic spread visualized around nerve(s)., vascular puncture avoided.  Image printed for medical record.  Negative aspiration and no paresthesias; incremental administration of local anesthetic. The patient tolerated the procedure well. Vitals signes recorded in RN notes.      

## 2019-07-30 NOTE — Op Note (Signed)
Operative Note    SURGERY DATE: 07/30/2019   PRE-OP DIAGNOSIS:  1. Left ankle bimalleolar ankle fracture   POST-OP DIAGNOSIS:  1. Left ankle bimalleolar ankle fracture  PROCEDURE(S): 1. ORIF Left ankle (bimalleolar ankle fracture with fixation of lateral and medial malleoli)  SURGEON: Rosealee Albee, MD    ANESTHESIA: Regional + Gen   ESTIMATED BLOOD LOSS: 25cc   DRAINS:  None   TOTAL IV FLUIDS: see anesthesia record  IMPLANTS: Stryker VariAx distal fibular plate 4 - 7.0WC Stryker locking screws distally (lateral malleolus) 3 - 3.62mm Stryker cortical screws proximally (lateral malleolus) 2 - 4.66mm Stryker cannulated screws (medial malleolus))  INDICATION(S): The patient is a 59 y.o. female who had her leg give out causing her to fall ~2 weeks ago.  She noted immediate ankle pain. Radiographs in the ED showed a bimalleolar ankle fracture. After discussion of risks, benefits, and alternatives to surgery, the patient elected to proceed with above procedure.  The patient did not want to proceed with immediate surgical intervention on the day of emergency department visit so we elected to proceed approximately 2 weeks after injury to allow for soft tissue swelling to decrease in order to reduce the chance of infection.   OPERATIVE FINDINGS: bimalleolar ankle fracture   OPERATIVE REPORT:   The patient was seen in the Holding Room. The risks, benefits, complications, treatment options, and expected outcomes were discussed with the patient. The risks and potential complications of the problem and proposed treatment include but are not limited to infection, bleeding, pain, stiffness, nerve and vessel injury, hardware failure or irritation, nonunion/malunion, and complication secondary to the anesthetic. The patient concurred with the proposed plan, giving informed consent.  The site of surgery was properly noted/marked.   The patient was taken to Operating Room and transferred to the  operating room table. A Time Out was held and the patient identity, procedure, and laterality was confirmed. After administration of adequate anesthesia, the entire lower extremity was prescrubbed with Hibiclens and alcohol, prepped with Chloroprep, and draped in sterile fashion. The patient was given pre-operative IV antibiotics within 30 minutes of the skin incision.   The tourniquet was inflated to after exsanguinating the leg with an Esmarch bandage. A standard distal fibular incision was made with a 15 blade along the lateral aspect of the fibula. The superficial peroneal nerve was not visualized within the field of dissection. Dissection was carried down to the fibula. The fracture site was identified and cleared of any tissue with a combination of dental pick, knife, and curette.  A reduction clamp was placed.  However the distal fibular bone was quite soft and was unable to be appropriately reduced with a reduction clamp.  The posterior and lateral cortex of the distal fragment was comminuted and removed from the surgical field.  I attempted to place a lag screw, but appropriate posterior cortical fixation cannot be achieved.  Therefore the fracture was manually reduced while bringing it out to appropriate length and a K wire was placed in a distal to proximal fashion through the fibula fragments and into the distal tibia.  This wire was positioned anterior to where the lateral fibular plate would sit.  This appropriately held the fibula in a reduced position. Reduction was confirmed visually and fluoroscopically.   A Stryker VariAx Distal Fibula plate was selected after confirming appropriate size and position fluoroscopically.  4 locking screws were placed in the distal fragment. Then 3 additional cortical screws were placed in the  proximal fragment. Fluoroscopy then confirmed appropriate hardware position and reduction.  Next, a standard medial malleolar incision was made with a 15 blade  along the medial aspect of the tibia.  Dissection was carried down sharply to the tibia. The periosteum overlying the fracture site was elevated. The fracture site was identified and cleared of any tissue with a combination of dental pick, knife, and curette.  The main medial malleolar fracture was reduced anatomically under direct visualization and K-wires were used to hold the reduction. Reduction was confirmed visually and fluoroscopically.  A cannulated drill was used to overdrill the K-wires. Two partially threaded 4.42mm cannulated screws were then placed and tightened sequentially. Fluoroscopy was then used to confirm appropriate hardware position and reduction. External rotation stress test was negative.  Tourniquet was released at 89 minutes. Hemostasis was achieved with bovie electrocautery. The wounds were then thoroughly irrigated.  0 Vicryl sutures were used to close the deep layer over the lateral plate. 3-0 Vicryl was used to close the subdermal layers. Staples were used to close the skin. The wounds were dressed with xeroform, fluffs, and cotton guaze.  The leg was then placed in a short leg splint.   The patient was awakened from anesthesia without any further complication and transferred to PACU for further recovery.    POST-OPERATIVE PLAN:  NWB for 6 weeks on operative extremity. ASA 325mg /daily x 4 weeks for DVT ppx. Plan for discharge home. F/U as outpatient in 2 weeks.

## 2019-07-30 NOTE — Anesthesia Procedure Notes (Signed)
Anesthesia Regional Block: Popliteal block   Pre-Anesthetic Checklist: ,, timeout performed, Correct Patient, Correct Site, Correct Laterality, Correct Procedure, Correct Position, site marked, Risks and benefits discussed,  Surgical consent,  Pre-op evaluation,  At surgeon's request and post-op pain management  Laterality: Left  Prep: chloraprep       Needles:  Injection technique: Single-shot  Needle Type: Echogenic Needle     Needle Length: 9cm  Needle Gauge: 21     Additional Needles:   Procedures:,,,, ultrasound used (permanent image in chart),,,,  Narrative:  Injection made incrementally with aspirations every 5 mL.  Performed by: Personally  Anesthesiologist: Shelvia Fojtik A, MD  Additional Notes: Functioning IV was confirmed and monitors applied. Ultrasound guidance: relevant anatomy identified, needle position confirmed, local anesthetic spread visualized around nerve(s)., vascular puncture avoided.  Image printed for medical record.  Negative aspiration and no paresthesias; incremental administration of local anesthetic. The patient tolerated the procedure well. Vitals signes recorded in RN notes.      

## 2019-07-30 NOTE — Transfer of Care (Signed)
Immediate Anesthesia Transfer of Care Note  Patient: Lisa Galvan  Procedure(s) Performed: OPEN REDUCTION INTERNAL FIXATION (ORIF) OF LEFT BIMALLEOLAR ANKLE FRACTURE (Left Ankle)  Patient Location: PACU  Anesthesia Type: General  Level of Consciousness: awake, alert  and patient cooperative  Airway and Oxygen Therapy: Patient Spontanous Breathing and Patient connected to supplemental oxygen  Post-op Assessment: Post-op Vital signs reviewed, Patient's Cardiovascular Status Stable, Respiratory Function Stable, Patent Airway and No signs of Nausea or vomiting  Post-op Vital Signs: Reviewed and stable  Complications: No apparent anesthesia complications

## 2019-07-31 DIAGNOSIS — E785 Hyperlipidemia, unspecified: Secondary | ICD-10-CM | POA: Diagnosis not present

## 2019-07-31 DIAGNOSIS — M47817 Spondylosis without myelopathy or radiculopathy, lumbosacral region: Secondary | ICD-10-CM | POA: Diagnosis not present

## 2019-07-31 DIAGNOSIS — M150 Primary generalized (osteo)arthritis: Secondary | ICD-10-CM | POA: Diagnosis not present

## 2019-07-31 DIAGNOSIS — K760 Fatty (change of) liver, not elsewhere classified: Secondary | ICD-10-CM | POA: Diagnosis not present

## 2019-07-31 DIAGNOSIS — S82842D Displaced bimalleolar fracture of left lower leg, subsequent encounter for closed fracture with routine healing: Secondary | ICD-10-CM | POA: Diagnosis not present

## 2019-07-31 DIAGNOSIS — N644 Mastodynia: Secondary | ICD-10-CM | POA: Diagnosis not present

## 2019-07-31 DIAGNOSIS — G894 Chronic pain syndrome: Secondary | ICD-10-CM | POA: Diagnosis not present

## 2019-07-31 DIAGNOSIS — Z79891 Long term (current) use of opiate analgesic: Secondary | ICD-10-CM | POA: Diagnosis not present

## 2019-07-31 DIAGNOSIS — E1142 Type 2 diabetes mellitus with diabetic polyneuropathy: Secondary | ICD-10-CM | POA: Diagnosis not present

## 2019-07-31 DIAGNOSIS — K8020 Calculus of gallbladder without cholecystitis without obstruction: Secondary | ICD-10-CM | POA: Diagnosis not present

## 2019-07-31 DIAGNOSIS — K219 Gastro-esophageal reflux disease without esophagitis: Secondary | ICD-10-CM | POA: Diagnosis not present

## 2019-07-31 DIAGNOSIS — I10 Essential (primary) hypertension: Secondary | ICD-10-CM | POA: Diagnosis not present

## 2019-07-31 DIAGNOSIS — M5136 Other intervertebral disc degeneration, lumbar region: Secondary | ICD-10-CM | POA: Diagnosis not present

## 2019-07-31 DIAGNOSIS — Z7984 Long term (current) use of oral hypoglycemic drugs: Secondary | ICD-10-CM | POA: Diagnosis not present

## 2019-07-31 DIAGNOSIS — E559 Vitamin D deficiency, unspecified: Secondary | ICD-10-CM | POA: Diagnosis not present

## 2019-07-31 DIAGNOSIS — Z9181 History of falling: Secondary | ICD-10-CM | POA: Diagnosis not present

## 2019-08-03 ENCOUNTER — Encounter: Payer: Self-pay | Admitting: *Deleted

## 2019-08-04 ENCOUNTER — Telehealth: Payer: Self-pay | Admitting: Family Medicine

## 2019-08-04 NOTE — Telephone Encounter (Signed)
Called and LVM asking for Judeth Cornfield to please return my call.

## 2019-08-04 NOTE — Telephone Encounter (Signed)
Ok for verbals 

## 2019-08-04 NOTE — Telephone Encounter (Signed)
Copied from CRM [id number]. Topic: Quick Communication - Home Health Verbal Orders >> Aug 04, 2019  9:14 AM Lynne Logan D wrote: Caller/Agency: Stefanie,OT/[facility] Callback Number: [phone]/Secure VM Requesting OT/PT/Skilled Nursing/Social Work/Speech Therapy: OT Frequency: 1 week 1 / 2 week 3 / 1 week 1

## 2019-08-05 DIAGNOSIS — M25572 Pain in left ankle and joints of left foot: Secondary | ICD-10-CM | POA: Diagnosis not present

## 2019-08-05 NOTE — Telephone Encounter (Signed)
Called and gave verbals per Dr. Laural Benes.

## 2019-08-08 DIAGNOSIS — Z7982 Long term (current) use of aspirin: Secondary | ICD-10-CM | POA: Diagnosis not present

## 2019-08-08 DIAGNOSIS — S82842D Displaced bimalleolar fracture of left lower leg, subsequent encounter for closed fracture with routine healing: Secondary | ICD-10-CM | POA: Diagnosis not present

## 2019-08-08 DIAGNOSIS — Z9181 History of falling: Secondary | ICD-10-CM | POA: Diagnosis not present

## 2019-08-10 ENCOUNTER — Ambulatory Visit: Payer: Self-pay | Admitting: Licensed Clinical Social Worker

## 2019-08-10 ENCOUNTER — Telehealth: Payer: Self-pay | Admitting: Family Medicine

## 2019-08-10 NOTE — Chronic Care Management (AMB) (Signed)
  Care Management   Follow Up Note   08/10/2019 Name: Lisa Galvan MRN: [id number] DOB: 1960-09-01  Referred by: Dorcas Carrow, DO Reason for referral : Care Coordination   Lisa Galvan is a 59 y.o. year old female who is a primary care patient of Dorcas Carrow, DO. The care management team was consulted for assistance with care management and care coordination needs.    Review of patient status, including review of consultants reports, relevant laboratory and other test results, and collaboration with appropriate care team members and the patient's provider was performed as part of comprehensive patient evaluation and provision of chronic care management services.    SDOH (Social Determinants of Health) assessments performed: Yes See Care Plan activities for detailed interventions related to [employer])     Advanced Directives: See Care Plan and Vynca application for related entries.   Goals Addressed    . SW: "I need more support and resource information" (pt-stated)       CARE PLAN ENTRY (see longitudinal plan of care for additional care plan information)  Current Barriers:  . Limited social support . Level of care concerns . ADL IADL limitations . Social Isolation . Limited access to caregiver  Clinical Social Work Clinical Goal(s):  Marland Kitchen Over the next 120 days, patient will work with SW to address concerns related to gaining additional support/resource connection in order to maintain health  Interventions: . Inter-disciplinary care team collaboration (see longitudinal plan of care) . Patient interviewed and appropriate assessments performed . Provided patient with information about available personal care service resources within [facility] ([facility].H[facility]E, Private Pay, [facility] and Day Programs). Patient reports that she is not eligible for Medicaid and therefore would be unable to get PCS. Patient states that she may have to pay out of pocket for personal care. . Discussed plans  with patient for ongoing care management follow up and provided patient with direct contact information for care management team . Advised patient to contact CCM LCSW if future social work needs arise. Steele Sizer with RN Case Manager re: Wound concerns . Collaborated with primary care provider re: Patient reports that the ED only made a referral for [facility] PT but not wound care. Patient has contacted [facility] and they stated they were in need of a wound care order. She said she has bled through her cast 2 times and is unable to change it on her own. This is day 3 of her being in the cast. She reports having a wound vacuum but she is wondering if PCP can make a wound care referral. PCP sent a TE to try to get it added to a verbal order. . Assisted patient/caregiver with obtaining information about health plan benefits . Provided education and assistance to client regarding Advanced Directives.  Patient Self Care Activities:  . Calls provider office for new concerns or questions . Lacks social connections  Initial goal documentation     The care management team will reach out to the patient again over the next quarter.  Dickie La, BSW, MSW, LCSW [facility] [place]  [facility] [email or web address] Phone: [phone]

## 2019-08-10 NOTE — Telephone Encounter (Signed)
Has home health through [facility] (just PT). Needs nursing and wound care. Can you see if they'll take a verbal order or if I need to send one? Thanks!

## 2019-08-11 DIAGNOSIS — Z9181 History of falling: Secondary | ICD-10-CM | POA: Diagnosis not present

## 2019-08-11 DIAGNOSIS — Z7982 Long term (current) use of aspirin: Secondary | ICD-10-CM | POA: Diagnosis not present

## 2019-08-11 DIAGNOSIS — S82842D Displaced bimalleolar fracture of left lower leg, subsequent encounter for closed fracture with routine healing: Secondary | ICD-10-CM | POA: Diagnosis not present

## 2019-08-11 NOTE — Telephone Encounter (Signed)
Called and left nursing manager a VM asking for her to please return my call.

## 2019-08-13 DIAGNOSIS — Z7982 Long term (current) use of aspirin: Secondary | ICD-10-CM | POA: Diagnosis not present

## 2019-08-13 DIAGNOSIS — S91002A Unspecified open wound, left ankle, initial encounter: Secondary | ICD-10-CM | POA: Diagnosis not present

## 2019-08-13 DIAGNOSIS — Z9181 History of falling: Secondary | ICD-10-CM | POA: Diagnosis not present

## 2019-08-13 DIAGNOSIS — S82842D Displaced bimalleolar fracture of left lower leg, subsequent encounter for closed fracture with routine healing: Secondary | ICD-10-CM | POA: Diagnosis not present

## 2019-08-13 DIAGNOSIS — T8189XA Other complications of procedures, not elsewhere classified, initial encounter: Secondary | ICD-10-CM | POA: Diagnosis not present

## 2019-08-13 NOTE — Telephone Encounter (Signed)
Received call back from [facility] this morning. They states that their physical therapist has removed the wound VAC and placed new bandage on wound. She states that she doesn't think that there is a need for any wound care or skilled nursing right now but that they would certainly call us if needed. States this was just done this morning, the wound/dressing change.

## 2019-08-14 ENCOUNTER — Telehealth: Payer: Self-pay | Admitting: General Practice

## 2019-08-14 ENCOUNTER — Ambulatory Visit: Payer: Self-pay | Admitting: General Practice

## 2019-08-14 DIAGNOSIS — S91002A Unspecified open wound, left ankle, initial encounter: Secondary | ICD-10-CM | POA: Diagnosis not present

## 2019-08-14 DIAGNOSIS — S82842D Displaced bimalleolar fracture of left lower leg, subsequent encounter for closed fracture with routine healing: Secondary | ICD-10-CM

## 2019-08-14 DIAGNOSIS — T8189XA Other complications of procedures, not elsewhere classified, initial encounter: Secondary | ICD-10-CM | POA: Diagnosis not present

## 2019-08-14 NOTE — Progress Notes (Signed)
Patient: Lisa Galvan  Service Category: E/M  Provider: Gaspar Cola, MD  DOB: 03-Nov-1960  DOS: 08/17/2019  Location: Office  MRN: [id number]  Setting: Ambulatory outpatient  Referring Provider: [name], DO  Type: Established Patient  Specialty: Interventional Pain Management  PCP: Lisa Roys, DO  Location: Remote location  Delivery: TeleHealth     Virtual Encounter - Pain Management PROVIDER NOTE: Information contained herein reflects review and annotations entered in association with encounter. Interpretation of such information and data should be left to medically-trained personnel. Information provided to patient can be located elsewhere in the medical record under "Patient Instructions". Document created using STT-dictation technology, any transcriptional errors that may result from process are unintentional.    Contact & Pharmacy Preferred: [phone] Home: [phone] (home) Mobile: [phone] (mobile) E-mail: [email or web address]  [facility] [address], [address] [place] [address] Phone: [phone] Fax: [phone]   Pre-screening  Lisa Galvan offered "in-person" vs "virtual" encounter. She indicated preferring virtual for this encounter.   Reason COVID-19*  Social distancing based on CDC and AMA recommendations.   I contacted Lisa Galvan on 08/17/2019 via telephone.      I clearly identified myself as Gaspar Cola, MD. I verified that I was speaking with the correct person using two identifiers (Name: Lisa Galvan, and date of birth: April 09, 1961).  Consent I sought verbal advanced consent from Lisa Galvan for virtual visit interactions. I informed Lisa Galvan of possible security and privacy concerns, risks, and limitations associated with providing "not-in-person" medical evaluation and management services. I also informed Lisa Galvan of the availability of "in-person"  appointments. Finally, I informed her that there would be a charge for the virtual visit and that she could be  personally, fully or partially, financially responsible for it. [name] expressed understanding and agreed to proceed.   Historic Elements   Lisa Galvan is a 59 y.o. year old, female patient evaluated today after her last contact with our practice on 07/21/2019. Lisa Galvan  has a past medical history of Allergy, Arthritis, Bilateral leg edema, Chest pain (12/15/2014), Chronic sciatica (12/10/2014), Essential hypertension (12/10/2014), Gastroesophageal reflux disease without esophagitis, Mixed hyperlipidemia, Obesity (BMI 30.0-34.9) (12/10/2014), and Type 2 diabetes mellitus without complication ([place]) (6/76/1952). She also  has a past surgical history that includes Abdominal hysterectomy; Esophagogastroduodenoscopy (egd) with propofol (N/A, 03/12/2017); Umbilical hernia repair (N/A, 05/01/2017); Hernia repair; Esophagogastroduodenoscopy (egd) with propofol (N/A, 03/30/2019); Colonoscopy with propofol (N/A, 03/30/2019); and ORIF ankle fracture (Left, 07/30/2019). Lisa Galvan has a current medication list which includes the following prescription(s): acetaminophen, amlodipine, aspirin ec, atorvastatin, vitamin d-3, cyanocobalamin, duloxetine, jardiance, epinephrine, fexofenadine, hydrochlorothiazide, ketoconazole, [START ON 09/21/2019] magnesium oxide, metformin, montelukast, omeprazole, pioglitazone, ondansetron, oxycodone, [START ON 08/28/2019] pregabalin, and valacyclovir. She  reports that she has never smoked. She has never used smokeless tobacco. She reports current alcohol use of about 3.0 standard drinks of alcohol per week. She reports that she does not use drugs. Lisa Galvan is allergic to amlodipine besy-benazepril hcl; ivp dye [iodinated diagnostic agents]; shellfish allergy; shellfish-derived products; and ace inhibitors.   HPI  Today, she is being contacted for both, medication management and a  post-procedure assessment.  The patient is being contacted to evaluate her Lyrica titration and to see how she is doing after the RFA.  Unfortunately, she was unable to tolerate the Lyrica 100 mg since it cost for her face  to develop some "puffiness".  She backed down from the 100 mg pill to taking Lyrica 50 mg p.o. 3 times daily and she indicates that the puffiness went away and she is able to tolerate the medication well.  She occasionally will experience pins-and-needles in her fingers, but she says that it is a rare event and therefore she does not feel that we need to adjust the medications in any way.  Since the radiofrequency, the patient also indicates that the numbness that she was experiencing in the lower extremities went away and she is extremely happy about this.  She is also getting excellent relief of the back pain and at this point she seems to be doing well and her chronic pain seems to be under control.  Today I will give her enough of the Lyrica 50 mg to be taken p.o. 3 times daily for the next 6 months.  The patient was informed that by then, we should be again back to face-to-face visits.  Post-Procedure Evaluation  Procedure: Therapeutic left lumbar facet RFA #1 under fluoroscopic guidance and IV sedation Pre-procedure pain level: 3/10 Post-procedure: 0/10 (100% relief)  Sedation: Sedation provided.  Effectiveness during initial hour after procedure(Ultra-Short Term Relief): 100 %   Local anesthetic used: Long-acting (4-6 hours) Effectiveness: Defined as any analgesic benefit obtained secondary to the administration of local anesthetics. This carries significant diagnostic value as to the etiological location, or anatomical origin, of the pain. Duration of benefit is expected to coincide with the duration of the local anesthetic used.  Effectiveness during initial 4-6 hours after procedure(Short-Term Relief): 100 %   Long-term benefit: Defined as any relief past the  pharmacologic duration of the local anesthetics.  Effectiveness past the initial 6 hours after procedure(Long-Term Relief): 95 %   Current benefits: Defined as benefit that persist at this time.              Analgesia:  90-100% better Function: Lisa Galvan reports improvement in function ROM: Lisa Galvan reports improvement in ROM  Pharmacotherapy Assessment  Analgesic: No opioid analgesics prescribed by our practice.   Monitoring: [place] PMP: PDMP reviewed during this encounter.       Pharmacotherapy: No side-effects or adverse reactions reported. Compliance: No problems identified. Effectiveness: Clinically acceptable. Plan: Refer to "POC".  UDS:  Summary  Date Value Ref Range Status  03/11/2019 Note  Final    Comment:    ==================================================================== Compliance Drug Analysis, Ur ==================================================================== Test                             Result       Flag       Units Drug Present   Gabapentin                     PRESENT   Duloxetine                     PRESENT   Ibuprofen                      PRESENT ==================================================================== Test                      Result    Flag   Units      Ref Range   Creatinine              90  mg/dL      >=20 ==================================================================== Declared Medications:  Medication list was not provided. ==================================================================== For clinical consultation, please call [phone]. ====================================================================    Laboratory Chemistry Profile   Renal Lab Results  Component Value Date   BUN 10 05/18/2019   CREATININE 0.88 05/18/2019   BCR 11 (L) 05/18/2019   GFRAA 83 05/18/2019   GFRNONAA 72 05/18/2019     Hepatic Lab Results  Component Value Date   AST 14 04/13/2019   ALT 8 04/13/2019   ALBUMIN  4.4 04/13/2019   ALKPHOS 63 04/13/2019     Electrolytes Lab Results  Component Value Date   NA 144 05/18/2019   K 4.0 05/18/2019   CL 101 05/18/2019   CALCIUM 10.0 05/18/2019   MG 1.5 (L) 03/11/2019   PHOS 4.7 (H) 07/03/2017     Bone Lab Results  Component Value Date   VD25OH 50.0 04/13/2019     Inflammation (CRP: Acute Phase) (ESR: Chronic Phase) Lab Results  Component Value Date   CRP <1 02/10/2019   ESRSEDRATE 27 03/11/2019       Note: Above Lab results reviewed.  Imaging  DG C-Arm 1-60 Min-No Report Fluoroscopy was utilized by the requesting physician.  No radiographic  interpretation.   Assessment  Diagnoses of Hypomagnesemia, Diabetic peripheral neuropathy ([place]), Chronic neuropathic pain, and Neurogenic pain were pertinent to this visit.  Plan of Care  Problem-specific:  No problem-specific Assessment & Plan notes found for this encounter.  Lisa Galvan has a current medication list which includes the following long-term medication(s): amlodipine, atorvastatin, duloxetine, fexofenadine, hydrochlorothiazide, [START ON 09/21/2019] magnesium oxide, metformin, montelukast, omeprazole, pioglitazone, and [START ON 08/28/2019] pregabalin.  Pharmacotherapy (Medications Ordered): Meds ordered this encounter  Medications  . Magnesium Oxide 500 MG CAPS    Sig: Take 1 capsule (500 mg total) by mouth 2 (two) times daily at 8 am and 10 pm.    Dispense:  60 capsule    Refill:  11    Fill one day early if pharmacy is closed on scheduled refill date. May substitute for generic if available.  . pregabalin (LYRICA) 50 MG capsule    Sig: Take 1 capsule (50 mg total) by mouth 3 (three) times daily.    Dispense:  90 capsule    Refill:  5    Fill one day early if pharmacy is closed on scheduled refill date. May substitute for generic if available.   Orders:  No orders of the defined types were placed in this encounter.  Follow-up plan:   Return in about 26 weeks (around  02/15/2020) for (F2F), (MM).      Interventional management options: Planned, scheduled, and/or pending:    Therapeutic left lumbar facet RFA #1 under fluoroscopic guidance and IV sedation, today.   Considering:   Pending results of EMG/PNCV of the lower extremities Diagnostic bilateral lumbar sympathetic block  Possible bilateral lumbar facet RFA  Diagnostic bilateral IA hip joint injection  Possible bilateral obturator + femoral NB  Possible bilateral obturator + femoral nerve RFA  Diagnostic bilateral SI joint block  Possible bilateral SI joint RFA    PRN Procedures:   Palliative right lumbar facet RFA #2 (last done 06/02/2019) Palliative bilateral lumbar facet block #3  Palliative right sacroiliac joint block #2      Recent Visits Date Type Provider Dept  07/21/19 Office Visit Milinda Pointer, MD [facility]  06/30/19 Procedure visit Milinda Pointer, [facility]  06/02/19 Procedure visit Dossie Arbour,  Beatriz Chancellor, MD [facility]  Showing recent visits within past 90 days and meeting all other requirements   Today's Visits Date Type Provider Dept  08/17/19 Telemedicine Milinda Pointer, MD [facility]  Showing today's visits and meeting all other requirements   Future Appointments No visits were found meeting these conditions.  Showing future appointments within next 90 days and meeting all other requirements   I discussed the assessment and treatment plan with the patient. The patient was provided an opportunity to ask questions and all were answered. The patient agreed with the plan and demonstrated an understanding of the instructions.  Patient advised to call back or seek an in-person evaluation if the symptoms or condition worsens.  Duration of encounter: 13 minutes.  Note by: Gaspar Cola, MD Date: 08/17/2019; Time: 10:59 AM

## 2019-08-14 NOTE — Chronic Care Management (AMB) (Signed)
Care Management   Follow Up Note   08/14/2019 Name: Lisa Galvan MRN: [id number] DOB: [birth year]  Referred by: Dorcas Carrow, DO Reason for referral : Care Coordination (Follow up: Closed bimalleolar fracture of left ankle-psot surgery)   Lisa Galvan is a 59 y.o. year old female who is a primary care patient of Dorcas Carrow, DO. The care management team was consulted for assistance with care management and care coordination needs.    Review of patient status, including review of consultants reports, relevant laboratory and other test results, and collaboration with appropriate care team members and the patient's provider was performed as part of comprehensive patient evaluation and provision of chronic care management services.    SDOH (Social Determinants of Health) assessments performed: Yes- evaluation of home needs. Patient has help currently as she is recovering from ankle surgery  See Care Plan activities for detailed interventions related to [facility])     Advanced Directives: See Care Plan and Vynca application for related entries.   Goals Addressed            This Visit's Progress    RNCM: Pt-"I do not have any one to help me bathe" (pt-stated)       CARE PLAN ENTRY (see longtitudinal plan of care for additional care plan information)  Current Barriers:   Knowledge Deficits related to how to obtain help in the home post fall with bimalleolar fracture to the left ankle needing surgical repair- surgery completed and the patient is recovering.   Knowledge Deficits related to facial edema and possible cause due to medications as evidence of reactions in the past from medications- completed  Use of walker in the home due to left ankle fracture- ankle fracture repaired and the patient is working with PT in her home, is still non-weight bearing  Stress and depression related to current circumstances with fx or ankle in need of surgical repair- working with [employer]  caregiver support.   Corporate treasurer.   Transportation barriers  Nurse Case Manager Clinical Goal(s):   Over the next 30 days, patient will verbalize understanding of plan for surgical repair of left ankle fracture and needed resources in the home as she recovers from injury/surgery- completed surgery completed and the patient is recovering at home  Over the next 120 days, patient will work with pcp, RNCM and CCM team to address needs related to in home help, community resources, home health and other needs the patient has until she can return to baseline health status  Over the next 120 days, patient will demonstrate a decrease in fall  exacerbations as evidenced by no new falls with injury  Over the next 60 days, patient will attend all scheduled medical appointments: Sees orthopedic provider today for plan of care related to fractured ankle- completed  Over the next 120 days, patient will demonstrate improved adherence to prescribed treatment plan for ankle fracture  as evidenced bysurgical repair of ankle fracture- completed now in the recovery phase and recovery to baseline status  Over the next 60 days, patient will demonstrate improved health management independence as evidenced byno new issues related to facial edema- completed  Over the next 60 days, patient will work with CM team pharmacist to determine possible medications that can cause facial swelling. The patient recently had an increase in her Lyrica dosage  Over the next 60 days, patient will work with CM clinical social worker to assist with the acute onset of depression and anxiety related to  injury and the need for surgical intervention.  The patient does not have help in the home and needs assistance  Over the next 30 days, patient will work with care guides  to assist with resources in the community to help the patient meet her needs- completed  Interventions:   Evaluation of current treatment plan related to  ankle fracture and facial edema and patient's adherence to plan as established by provider.  Advised patient to let the RNCM know of surgical plans and any new needs that may arise.  The patient has had surgery and is recovering at home. Her daughter and granddaughter are here from [place] helping the patient   Provided education to patient re: RNCM team involvement to help her meet her health and wellness needs  Reviewed medications with patient and discussed the Lyrica.  The patient  has been taking this medication x 2  months. Did not seem to be having an issue until dosage was increased. Will consult with CCM pharmacist for help  Collaborated with CCM team and pcp  regarding patients immediate needs and concerns- done  Discussed plans with patient for ongoing care management follow up and provided patient with direct contact information for care management team  Reviewed scheduled/upcoming provider appointments including: Orthopedic appointment today at 1130. Future pcp appointment for discussion of bone density test and other needs.   Evaluation of current needs.  The patient is doing well now. She has had the wound vac removed and no more incidence of bleeding has occurred. The patient states that she is working with PT and they are changing her bandages when they come. She has a post follow up on 4/26 with orthopedic provider to take the remainder of her stables out. She can not bare weight on her foot for at least 4 more weeks. The patient's daughter and granddaughter are currently here from [place] helping her. She is thankful for the help she is getting from them. Denies any concerns at this time.   Care Guide referral for resources in the community to help while the patient is unable to independently care for self  Social Work referral for acute onset of depression and anxiety related to fall with injury and the need for surgical intervention  Pharmacy referral for medication review to  see if medication the patient is currently taking could cause facial edema.  Patient Self Care Activities:   Patient verbalizes understanding of plan to have CCM team work with the patient to meet needs during acute onset of fall with injury and the need for surgical intervention to left ankle  Attends all scheduled provider appointments  Performs ADL's independently  Performs IADL's independently  Calls provider office for new concerns or questions  Unable to independently care for self post fall with injury resulting in ankle fracture needing surgical intervention  Lacks social connections  Unable to perform ADLs independently  Unable to perform IADLs independently  Please see past updates related to this goal by clicking on the "Past Updates" button in the selected goal          The care management team will reach out to the patient again over the next 30 to 60 days.   [name] RN, MSN, [facility] Mobile: [phone]

## 2019-08-14 NOTE — Patient Instructions (Signed)
Visit Information  Goals Addressed            This Visit's Progress   . RNCM: Pt-"I do not have any one to help me bathe" (pt-stated)       CARE PLAN ENTRY (see longtitudinal plan of care for additional care plan information)  Current Barriers:  Marland Kitchen Knowledge Deficits related to how to obtain help in the home post fall with bimalleolar fracture to the left ankle needing surgical repair- surgery completed and the patient is recovering.  . Knowledge Deficits related to facial edema and possible cause due to medications as evidence of reactions in the past from medications- completed . Use of walker in the home due to left ankle fracture- ankle fracture repaired and the patient is working with PT in her home, is still non-weight bearing . Stress and depression related to current circumstances with fx or ankle in need of surgical repair- working with LCSW . Lacks caregiver support.  . Corporate treasurer.  . Transportation barriers  Nurse Case Manager Clinical Goal(s):  Marland Kitchen Over the next 30 days, patient will verbalize understanding of plan for surgical repair of left ankle fracture and needed resources in the home as she recovers from injury/surgery- completed surgery completed and the patient is recovering at home . Over the next 120 days, patient will work with pcp, RNCM and CCM team to address needs related to in home help, community resources, home health and other needs the patient has until she can return to baseline health status . Over the next 120 days, patient will demonstrate a decrease in fall  exacerbations as evidenced by no new falls with injury . Over the next 60 days, patient will attend all scheduled medical appointments: Sees orthopedic provider today for plan of care related to fractured ankle- completed . Over the next 120 days, patient will demonstrate improved adherence to prescribed treatment plan for ankle fracture  as evidenced bysurgical repair of ankle fracture-  completed now in the recovery phase and recovery to baseline status . Over the next 60 days, patient will demonstrate improved health management independence as evidenced byno new issues related to facial edema- completed . Over the next 60 days, patient will work with CM team pharmacist to determine possible medications that can cause facial swelling. The patient recently had an increase in her Lyrica dosage . Over the next 60 days, patient will work with CM clinical social worker to assist with the acute onset of depression and anxiety related to injury and the need for surgical intervention.  The patient does not have help in the home and needs assistance . Over the next 30 days, patient will work with care guides  to assist with resources in the community to help the patient meet her needs- completed  Interventions:  . Evaluation of current treatment plan related to ankle fracture and facial edema and patient's adherence to plan as established by provider. . Advised patient to let the RNCM know of surgical plans and any new needs that may arise.  The patient has had surgery and is recovering at home. Her daughter and granddaughter are here from [place] helping the patient  . Provided education to patient re: RNCM team involvement to help her meet her health and wellness needs . Reviewed medications with patient and discussed the Lyrica.  The patient  has been taking this medication x 2  months. Did not seem to be having an issue until dosage was increased. Will consult with CCM pharmacist  for help . Collaborated with CCM team and pcp  regarding patients immediate needs and concerns- done . Discussed plans with patient for ongoing care management follow up and provided patient with direct contact information for care management team . Reviewed scheduled/upcoming provider appointments including: Orthopedic appointment today at 1130. Future pcp appointment for discussion of bone density test and other  needs.  . Evaluation of current needs.  The patient is doing well now. She has had the wound vac removed and no more incidence of bleeding has occurred. The patient states that she is working with PT and they are changing her bandages when they come. She has a post follow up on 4/26 with orthopedic provider to take the remainder of her stables out. She can not bare weight on her foot for at least 4 more weeks. The patient's daughter and granddaughter are currently here from [place] helping her. She is thankful for the help she is getting from them. Denies any concerns at this time.  . Care Guide referral for resources in the community to help while the patient is unable to independently care for self . Social Work referral for acute onset of depression and anxiety related to fall with injury and the need for surgical intervention . Pharmacy referral for medication review to see if medication the patient is currently taking could cause facial edema.  Patient Self Care Activities:  . Patient verbalizes understanding of plan to have CCM team work with the patient to meet needs during acute onset of fall with injury and the need for surgical intervention to left ankle . Attends all scheduled provider appointments . Performs ADL's independently . Performs IADL's independently . Calls provider office for new concerns or questions . Unable to independently care for self post fall with injury resulting in ankle fracture needing surgical intervention . Lacks social connections . Unable to perform ADLs independently . Unable to perform IADLs independently  Please see past updates related to this goal by clicking on the "Past Updates" button in the selected goal         Patient verbalizes understanding of instructions provided today.   The care management team will reach out to the patient again over the next 30 to 60 days.   [name] RN, MSN, [facility] Mobile: [phone]

## 2019-08-15 DIAGNOSIS — S91002A Unspecified open wound, left ankle, initial encounter: Secondary | ICD-10-CM | POA: Diagnosis not present

## 2019-08-15 DIAGNOSIS — T8189XA Other complications of procedures, not elsewhere classified, initial encounter: Secondary | ICD-10-CM | POA: Diagnosis not present

## 2019-08-16 DIAGNOSIS — S91002A Unspecified open wound, left ankle, initial encounter: Secondary | ICD-10-CM | POA: Diagnosis not present

## 2019-08-16 DIAGNOSIS — T8189XA Other complications of procedures, not elsewhere classified, initial encounter: Secondary | ICD-10-CM | POA: Diagnosis not present

## 2019-08-17 ENCOUNTER — Other Ambulatory Visit: Payer: Self-pay

## 2019-08-17 ENCOUNTER — Ambulatory Visit: Payer: BLUE CROSS/BLUE SHIELD | Attending: Pain Medicine | Admitting: Pain Medicine

## 2019-08-17 DIAGNOSIS — S91002A Unspecified open wound, left ankle, initial encounter: Secondary | ICD-10-CM | POA: Diagnosis not present

## 2019-08-17 DIAGNOSIS — G8929 Other chronic pain: Secondary | ICD-10-CM | POA: Diagnosis not present

## 2019-08-17 DIAGNOSIS — M792 Neuralgia and neuritis, unspecified: Secondary | ICD-10-CM | POA: Diagnosis not present

## 2019-08-17 DIAGNOSIS — E1142 Type 2 diabetes mellitus with diabetic polyneuropathy: Secondary | ICD-10-CM | POA: Diagnosis not present

## 2019-08-17 DIAGNOSIS — T8189XA Other complications of procedures, not elsewhere classified, initial encounter: Secondary | ICD-10-CM | POA: Diagnosis not present

## 2019-08-17 MED ORDER — MAGNESIUM OXIDE -MG SUPPLEMENT 500 MG PO CAPS: 1.0000 | ORAL_CAPSULE | Freq: Two times a day (BID) | ORAL | 11 refills | Status: DC

## 2019-08-17 MED ORDER — PREGABALIN 50 MG PO CAPS: 50.0000 mg | ORAL_CAPSULE | Freq: Three times a day (TID) | ORAL | 5 refills | Status: DC

## 2019-08-17 NOTE — Patient Instructions (Signed)
____________________________________________________________________________________________  Muscle Spasms & Cramps  Cause:  The most common cause of muscle spasms and cramps is vitamin and/or electrolyte (calcium, potassium, sodium, etc.) deficiencies.  Possible triggers: Sweating - causes loss of electrolytes thru the skin. Steroids - causes loss of electrolytes thru the urine.  Treatment: 1. Gatorade (or any other electrolyte-replenishing drink) - Take 1, 8 oz glass with each meal (3 times a day). 2. OTC (over-the-counter) Magnesium 400 to 500 mg - Take 1 tablet twice a day (one with breakfast and one before bedtime). If you have kidney problems, talk to your primary care physician before taking any Magnesium. 3. Tonic Water with quinine - Take 1, 8 oz glass before bedtime.   ____________________________________________________________________________________________    

## 2019-08-18 ENCOUNTER — Telehealth: Payer: Self-pay

## 2019-08-18 ENCOUNTER — Ambulatory Visit: Payer: Self-pay | Admitting: Pharmacist

## 2019-08-18 DIAGNOSIS — S82842D Displaced bimalleolar fracture of left lower leg, subsequent encounter for closed fracture with routine healing: Secondary | ICD-10-CM | POA: Diagnosis not present

## 2019-08-18 DIAGNOSIS — Z9181 History of falling: Secondary | ICD-10-CM | POA: Diagnosis not present

## 2019-08-18 DIAGNOSIS — Z7982 Long term (current) use of aspirin: Secondary | ICD-10-CM | POA: Diagnosis not present

## 2019-08-18 DIAGNOSIS — S91002A Unspecified open wound, left ankle, initial encounter: Secondary | ICD-10-CM | POA: Diagnosis not present

## 2019-08-18 DIAGNOSIS — T8189XA Other complications of procedures, not elsewhere classified, initial encounter: Secondary | ICD-10-CM | POA: Diagnosis not present

## 2019-08-18 NOTE — Chronic Care Management (AMB) (Signed)
  Chronic Care Management   Note  08/18/2019 Name: Lisa Galvan MRN: [id number] DOB: 02-03-1961  Jaylenn Baiza is a 59 y.o. year old female who is a primary care patient of Dorcas Carrow, DO. The CCM team was consulted for assistance with chronic disease management and care coordination needs.    Attempted to contact patient for medication management review. Left HIPAA compliant message for patient to return my call at their convenience.  Of note, she has not yet scheduled DEXA scan s/p fracture. Would strongly consider d/c pioglitazone, given increased risk of fracture.   Plan: - Will collaborate with Care Guide to outreach to schedule follow up with me  Catie Feliz Beam, PharmD, [facility] Clinical Pharmacist [facility] [phone]

## 2019-08-19 ENCOUNTER — Telehealth: Payer: Self-pay | Admitting: Family Medicine

## 2019-08-19 DIAGNOSIS — T8189XA Other complications of procedures, not elsewhere classified, initial encounter: Secondary | ICD-10-CM | POA: Diagnosis not present

## 2019-08-19 DIAGNOSIS — R600 Localized edema: Secondary | ICD-10-CM | POA: Diagnosis not present

## 2019-08-19 DIAGNOSIS — S91002A Unspecified open wound, left ankle, initial encounter: Secondary | ICD-10-CM | POA: Diagnosis not present

## 2019-08-19 DIAGNOSIS — E782 Mixed hyperlipidemia: Secondary | ICD-10-CM | POA: Diagnosis not present

## 2019-08-19 DIAGNOSIS — I10 Essential (primary) hypertension: Secondary | ICD-10-CM | POA: Diagnosis not present

## 2019-08-19 LAB — HM DIABETES EYE EXAM

## 2019-08-19 NOTE — Chronic Care Management (AMB) (Signed)
  Care Management   Note  08/19/2019 Name: Lisa Galvan MRN: [id number] DOB: [birth year]  Lisa Galvan is a 59 y.o. year old female who is a primary care patient of Dorcas Carrow, DO and is actively engaged with the care management team. I reached out to [name] by phone today to assist with scheduling an initial visit with the Pharmacist  Follow up plan: Telephone appointment with care management team member scheduled for:09/04/2019  Penne Lash, RMA Care Guide, Embedded Care Coordination [facility]  [place], [place] [address] Direct Dial: [phone] [email or web address] Website: [email or web address]

## 2019-08-20 DIAGNOSIS — S91002A Unspecified open wound, left ankle, initial encounter: Secondary | ICD-10-CM | POA: Diagnosis not present

## 2019-08-20 DIAGNOSIS — T8189XA Other complications of procedures, not elsewhere classified, initial encounter: Secondary | ICD-10-CM | POA: Diagnosis not present

## 2019-08-21 DIAGNOSIS — T8189XA Other complications of procedures, not elsewhere classified, initial encounter: Secondary | ICD-10-CM | POA: Diagnosis not present

## 2019-08-21 DIAGNOSIS — S91002A Unspecified open wound, left ankle, initial encounter: Secondary | ICD-10-CM | POA: Diagnosis not present

## 2019-08-22 DIAGNOSIS — S91002A Unspecified open wound, left ankle, initial encounter: Secondary | ICD-10-CM | POA: Diagnosis not present

## 2019-08-22 DIAGNOSIS — T8189XA Other complications of procedures, not elsewhere classified, initial encounter: Secondary | ICD-10-CM | POA: Diagnosis not present

## 2019-08-23 DIAGNOSIS — T8189XA Other complications of procedures, not elsewhere classified, initial encounter: Secondary | ICD-10-CM | POA: Diagnosis not present

## 2019-08-23 DIAGNOSIS — S91002A Unspecified open wound, left ankle, initial encounter: Secondary | ICD-10-CM | POA: Diagnosis not present

## 2019-08-24 DIAGNOSIS — S91002A Unspecified open wound, left ankle, initial encounter: Secondary | ICD-10-CM | POA: Diagnosis not present

## 2019-08-24 DIAGNOSIS — S82842D Displaced bimalleolar fracture of left lower leg, subsequent encounter for closed fracture with routine healing: Secondary | ICD-10-CM | POA: Diagnosis not present

## 2019-08-24 DIAGNOSIS — T8189XA Other complications of procedures, not elsewhere classified, initial encounter: Secondary | ICD-10-CM | POA: Diagnosis not present

## 2019-08-24 DIAGNOSIS — Z7982 Long term (current) use of aspirin: Secondary | ICD-10-CM | POA: Diagnosis not present

## 2019-08-24 DIAGNOSIS — Z9181 History of falling: Secondary | ICD-10-CM | POA: Diagnosis not present

## 2019-08-25 DIAGNOSIS — S91002A Unspecified open wound, left ankle, initial encounter: Secondary | ICD-10-CM | POA: Diagnosis not present

## 2019-08-25 DIAGNOSIS — T8189XA Other complications of procedures, not elsewhere classified, initial encounter: Secondary | ICD-10-CM | POA: Diagnosis not present

## 2019-08-26 DIAGNOSIS — S91002A Unspecified open wound, left ankle, initial encounter: Secondary | ICD-10-CM | POA: Diagnosis not present

## 2019-08-26 DIAGNOSIS — T8189XA Other complications of procedures, not elsewhere classified, initial encounter: Secondary | ICD-10-CM | POA: Diagnosis not present

## 2019-08-27 DIAGNOSIS — Z9181 History of falling: Secondary | ICD-10-CM | POA: Diagnosis not present

## 2019-08-27 DIAGNOSIS — S91002A Unspecified open wound, left ankle, initial encounter: Secondary | ICD-10-CM | POA: Diagnosis not present

## 2019-08-27 DIAGNOSIS — S82842D Displaced bimalleolar fracture of left lower leg, subsequent encounter for closed fracture with routine healing: Secondary | ICD-10-CM | POA: Diagnosis not present

## 2019-08-27 DIAGNOSIS — Z7982 Long term (current) use of aspirin: Secondary | ICD-10-CM | POA: Diagnosis not present

## 2019-08-27 DIAGNOSIS — T8189XA Other complications of procedures, not elsewhere classified, initial encounter: Secondary | ICD-10-CM | POA: Diagnosis not present

## 2019-08-28 DIAGNOSIS — S91002A Unspecified open wound, left ankle, initial encounter: Secondary | ICD-10-CM | POA: Diagnosis not present

## 2019-08-28 DIAGNOSIS — T8189XA Other complications of procedures, not elsewhere classified, initial encounter: Secondary | ICD-10-CM | POA: Diagnosis not present

## 2019-08-29 DIAGNOSIS — T8189XA Other complications of procedures, not elsewhere classified, initial encounter: Secondary | ICD-10-CM | POA: Diagnosis not present

## 2019-08-29 DIAGNOSIS — S91002A Unspecified open wound, left ankle, initial encounter: Secondary | ICD-10-CM | POA: Diagnosis not present

## 2019-08-30 DIAGNOSIS — S91002A Unspecified open wound, left ankle, initial encounter: Secondary | ICD-10-CM | POA: Diagnosis not present

## 2019-08-30 DIAGNOSIS — T8189XA Other complications of procedures, not elsewhere classified, initial encounter: Secondary | ICD-10-CM | POA: Diagnosis not present

## 2019-08-31 DIAGNOSIS — S91002A Unspecified open wound, left ankle, initial encounter: Secondary | ICD-10-CM | POA: Diagnosis not present

## 2019-08-31 DIAGNOSIS — T8189XA Other complications of procedures, not elsewhere classified, initial encounter: Secondary | ICD-10-CM | POA: Diagnosis not present

## 2019-09-01 DIAGNOSIS — S91002A Unspecified open wound, left ankle, initial encounter: Secondary | ICD-10-CM | POA: Diagnosis not present

## 2019-09-01 DIAGNOSIS — T8189XA Other complications of procedures, not elsewhere classified, initial encounter: Secondary | ICD-10-CM | POA: Diagnosis not present

## 2019-09-02 ENCOUNTER — Encounter: Payer: Self-pay | Admitting: Surgery

## 2019-09-02 ENCOUNTER — Other Ambulatory Visit: Payer: Self-pay

## 2019-09-02 ENCOUNTER — Telehealth: Payer: Self-pay

## 2019-09-02 ENCOUNTER — Ambulatory Visit (INDEPENDENT_AMBULATORY_CARE_PROVIDER_SITE_OTHER): Payer: BLUE CROSS/BLUE SHIELD | Admitting: Surgery

## 2019-09-02 DIAGNOSIS — S82842D Displaced bimalleolar fracture of left lower leg, subsequent encounter for closed fracture with routine healing: Secondary | ICD-10-CM | POA: Diagnosis not present

## 2019-09-02 DIAGNOSIS — R1084 Generalized abdominal pain: Secondary | ICD-10-CM | POA: Diagnosis not present

## 2019-09-02 DIAGNOSIS — Z7982 Long term (current) use of aspirin: Secondary | ICD-10-CM | POA: Diagnosis not present

## 2019-09-02 DIAGNOSIS — Z9181 History of falling: Secondary | ICD-10-CM | POA: Diagnosis not present

## 2019-09-02 DIAGNOSIS — T8189XA Other complications of procedures, not elsewhere classified, initial encounter: Secondary | ICD-10-CM | POA: Diagnosis not present

## 2019-09-02 DIAGNOSIS — S91002A Unspecified open wound, left ankle, initial encounter: Secondary | ICD-10-CM | POA: Diagnosis not present

## 2019-09-02 NOTE — Patient Instructions (Addendum)
We will schedule you for a CT of the abdomen and pelvis with out contrast. We will contact you about this.   We will see you back in a couple of weeks to go over the next steps.

## 2019-09-02 NOTE — Telephone Encounter (Signed)
Called patient and went over CT information. Patient has been scheduled for a CT abdomen/pelvis without contrast at [place] on May 24th at 8:30 am. She is to arrive there by 8:15 am. Prep: NPO 4 hours prior and pick up prep kit. Patient verbalizes understanding.

## 2019-09-02 NOTE — Progress Notes (Signed)
Surgical Consultation  09/02/2019  Lisa Galvan is an 59 y.o. female.   Chief Complaint  Patient presents with  . Follow-up     HPI: Lisa Galvan is a 58 year old female well-known to our practice with a history of prior open umbilical and epigastric hernia repair with ventraLex patch performed by Dr. Earlene Plater over 2 years ago ( 04/2017).  He also had a fall about 6 weeks ago and suffered a left ankle fracture that require open reduction internal fixation by Dr. Allena Katz.  She still is wearing a boot.  She is walking with a walker and is recovering slowly.  Of note patient reports that since the fall also had failed and another bulge within the abdomen and some intermittent mild pain that is worsening with Valsalva.  Pain is sharp mild to moderate intensity.  No fevers no chills no evidence of bowel obstructions.  She has not had any imaging studies recently.  Her last hemoglobin A1c is less than 6 and she does not have any recent chest pains or any shortness of breath.  He was able to perform more than 4 METS of activity without shortness of breath or chest pain before the fracture on the left ankle.  His recent CMP and CBC are completely normal. He does have his significant history of back issues and takes Lyrica. I have personally reviewed the operative reports and previous records.  Past Medical History:  Diagnosis Date  . Allergy   . Arthritis    spine  . Bilateral leg edema   . Chest pain 12/15/2014  . Chronic sciatica 12/10/2014  . Essential hypertension 12/10/2014  . Gastroesophageal reflux disease without esophagitis   . Mixed hyperlipidemia   . Obesity (BMI 30.0-34.9) 12/10/2014  . Type 2 diabetes mellitus without complication (HCC) 12/10/2014    Past Surgical History:  Procedure Laterality Date  . ABDOMINAL HYSTERECTOMY    . COLONOSCOPY WITH PROPOFOL N/A 03/30/2019   Procedure: COLONOSCOPY WITH PROPOFOL;  Surgeon: Toney Reil, MD;  Location: [facility] ENDOSCOPY;  Service:  Gastroenterology;  Laterality: N/A;  . ESOPHAGOGASTRODUODENOSCOPY (EGD) WITH PROPOFOL N/A 03/12/2017   Procedure: ESOPHAGOGASTRODUODENOSCOPY (EGD) WITH PROPOFOL;  Surgeon: Toney Reil, MD;  Location: [facility] ENDOSCOPY;  Service: Gastroenterology;  Laterality: N/A;  . ESOPHAGOGASTRODUODENOSCOPY (EGD) WITH PROPOFOL N/A 03/30/2019   Procedure: ESOPHAGOGASTRODUODENOSCOPY (EGD) WITH PROPOFOL;  Surgeon: Toney Reil, MD;  Location[facility] ENDOSCOPY;  Service: Gastroenterology;  Laterality: N/A;  . HERNIA REPAIR    . ORIF ANKLE FRACTURE Left 07/30/2019   Procedure: OPEN REDUCTION INTERNAL FIXATION (ORIF) OF LEFT BIMALLEOLAR ANKLE FRACTURE;  Surgeon: Signa Kell, MD;  Location: [facility];  Service: Orthopedics;  Laterality: Left;  Diabetic - oral meds  . UMBILICAL HERNIA REPAIR N/A 05/01/2017   Procedure: HERNIA REPAIR UMBILICAL ADULT;  Surgeon: Ancil Linsey, MD;  Location: [facility] ORS;  Service: General;  Laterality: N/A;    Family History  Problem Relation Age of Onset  . Diabetes Mother   . Cancer Mother        breast and stomach  . Breast cancer Mother 21  . Heart disease Father   . Kidney disease Father        dialysis  . Cancer Father        colon  . Diabetes Father   . Hypertension Sister   . Hypertension Brother   . Congestive Heart Failure Maternal Grandmother   . Diabetes Maternal Grandmother   . Hypertension Maternal Grandmother     Social History:  reports that she has never smoked. She has never used smokeless tobacco. She reports current alcohol use of about 3.0 standard drinks of alcohol per week. She reports that she does not use drugs.  Allergies:  Allergies  Allergen Reactions  . Amlodipine Besy-Benazepril Hcl Anaphylaxis  . Ivp Dye [Iodinated Diagnostic Agents] Anaphylaxis  . Shellfish Allergy Swelling and Anaphylaxis  . Shellfish-Derived Products Anaphylaxis  . Ace Inhibitors Swelling    Medications reviewed.     ROS Full ROS performed  and is otherwise negative other than what is stated in the HPI    BP 122/85   Pulse 91   Temp (!) 97.4 F (36.3 C)   Ht 5\' 3"  (1.6 m)   Wt 200 lb (90.7 kg)   SpO2 94%   BMI 35.43 kg/m   Physical Exam Vitals and nursing note reviewed. Exam conducted with a chaperone present.  Constitutional:      General: She is not in acute distress.    Appearance: Normal appearance. She is normal weight.  Eyes:     General: No scleral icterus.       Right eye: No discharge.        Left eye: No discharge.  Cardiovascular:     Rate and Rhythm: Normal rate and regular rhythm.     Heart sounds: No murmur.  Pulmonary:     Effort: Pulmonary effort is normal. No respiratory distress.     Breath sounds: Normal breath sounds. No stridor. No wheezing or rhonchi.  Abdominal:     General: Abdomen is flat. There is no distension.     Palpations: There is no mass.     Tenderness: There is abdominal tenderness. There is no rebound.     Hernia: A hernia is present.     Comments: Reducible ventral hernia, mildly tender to palpation, no peritonitis  Musculoskeletal:        General: No swelling. Normal range of motion.     Cervical back: Normal range of motion and neck supple. No rigidity or tenderness.     Comments: She is wearing a left orthopedic boot  Lymphadenopathy:     Cervical: No cervical adenopathy.  Skin:    General: Skin is warm and dry.     Capillary Refill: Capillary refill takes less than 2 seconds.     Coloration: Skin is not jaundiced.  Neurological:     General: No focal deficit present.     Mental Status: She is alert and oriented to person, place, and time.  Psychiatric:        Mood and Affect: Mood normal.        Behavior: Behavior normal.        Thought Content: Thought content normal.        Judgment: Judgment normal.        Assessment/Plan: 59 year old female with signs and symptoms consistent with recurrent symptomatic ventral hernia.  Given her recent orthopedic  injury in the left ankle will want to wait a few more weeks to let her go to baseline.  From planning perspective I do think a CT scan of the abdomen and pelvis will be very valuable regarding surgical planning.  I do think that she will be a good candidate for robotic approach but I would like to first establish the anatomy of the abdominal wall and determine the size of the defect.  There is no need for emergent surgical intervention at this time. We will see her back after she completes  her CT scan and talk to her little bit more about potential surgical intervention.  Please note that I spent about 40 minutes in this encounter with greater than 50% spent in coordination and counseling of her care.  Sterling Big, MD [facility] General Surgeon

## 2019-09-03 DIAGNOSIS — T8189XA Other complications of procedures, not elsewhere classified, initial encounter: Secondary | ICD-10-CM | POA: Diagnosis not present

## 2019-09-03 DIAGNOSIS — S91002A Unspecified open wound, left ankle, initial encounter: Secondary | ICD-10-CM | POA: Diagnosis not present

## 2019-09-04 ENCOUNTER — Ambulatory Visit: Payer: BLUE CROSS/BLUE SHIELD | Admitting: Pharmacist

## 2019-09-04 DIAGNOSIS — E785 Hyperlipidemia, unspecified: Secondary | ICD-10-CM

## 2019-09-04 DIAGNOSIS — E1140 Type 2 diabetes mellitus with diabetic neuropathy, unspecified: Secondary | ICD-10-CM

## 2019-09-04 NOTE — Patient Instructions (Signed)
Visit Information  Goals Addressed            This Visit's Progress     Patient Stated   . PharmD "I want to make sure I'm using the best medication" (pt-stated)       CARE PLAN ENTRY (see longtitudinal plan of care for additional care plan information)  Current Barriers:  . Diabetes: controlled, but not optimally managed; complicated by chronic medical conditions including T2DM, HTN, chronic pain, most recent A1c 5.9% . Most recent eGFR: ~88 mL/min . Current antihyperglycemic regimen: metformin 500 mg BID, Jardiance 10 mg, pioglitazone 30 mg  o Notes she has never been on higher doses of metformin . Current blood glucose readings:  o Fastings: 100-110s . Cardiovascular risk reduction (follows w/ Dr. Nehemiah Massed); notes that her father had significant cardiovascular disease, CABG x2 starting when he was age ~3 o Current hypertensive regimen: HCTZ 25 mg, amlodipine 5 mg PM, last office BP at goal o Current hyperlipidemia regimen: atorvastatin 20 mg daily, last LDL 77 o Current antiplatelet regimen: n/a . Chronic pain: follows w/ [facility]; duloxetine 60 mg daily, pregabalin 50 mg TID (had itching w/ higher does, but notes that she is doing well at this time on this dose)  Pharmacist Clinical Goal(s):  Marland Kitchen Over the next 90 days, patient will work with PharmD and primary care provider to address optimized medication management  Interventions: . Comprehensive medication review performed, medication list updated in electronic medical record . Inter-disciplinary care team collaboration (see longitudinal plan of care) . Reviewed goal A1c, goal fasting, and goal 2 hour post prandial.  . Reviewed risk of bone fracture w/ pioglitazone. Given very well controlled A1c (and ability to titrate metformin or Jardiance in the future), recommend d/c pioglitazone. Will discuss w/ PCP . Discussed goal LDL <70 given risk factor of HTN. Patient also has family history of ASCVD, though does not sound like  premature ASCVD. Recommend increasing atorvastatin to 40 mg daily.  . Reviewed need to schedule DEXA. Provided phone number to call and schedule . Will collaborate w/ office staff to call to schedule f/u with Dr. Wynetta Emery  Patient Self Care Activities:  . Patient will check blood glucose daily , document, and provide at future appointments . Patient will take medications as prescribed . Patient will report any questions or concerns to provider   Initial goal documentation        Patient verbalizes understanding of instructions provided today.     Plan: - Scheduled f/u call in ~ 12 weeks  Catie Darnelle Maffucci, PharmD, [place] [phone]

## 2019-09-04 NOTE — Chronic Care Management (AMB) (Signed)
Chronic Care Management   Note  09/04/2019 Name: Lisa Galvan MRN: [id number] DOB: 03/04/1961   Subjective:  Lisa Galvan is a 59 y.o. year old female who is a primary care patient of Johnson, Megan P, DO. The CCM team was consulted for assistance with chronic disease management and care coordination needs.    Contacted patient for medication management review.   Review of patient status, including review of consultants reports, laboratory and other test data, was performed as part of comprehensive evaluation and provision of chronic care management services.   SDOH (Social Determinants of Health) assessments and interventions performed:  yes  Objective:  Lab Results  Component Value Date   CREATININE 0.88 05/18/2019   CREATININE 0.91 04/13/2019   CREATININE 0.80 03/22/2019    Lab Results  Component Value Date   HGBA1C 5.9 04/13/2019       Component Value Date/Time   CHOL 221 (H) 04/13/2019 0836   TRIG 62 04/13/2019 0836   HDL 133 04/13/2019 0836   CHOLHDL 1.4 01/03/2017 1139   LDLCALC 77 04/13/2019 0836    Clinical ASCVD: No  The ASCVD Risk score (Goff DC Jr., et al., 2013) failed to calculate for the following reasons:   The valid HDL cholesterol range is 20 to 100 mg/dL    BP Readings from Last 3 Encounters:  09/02/19 122/85  07/30/19 113/82  07/21/19 128/78    Allergies  Allergen Reactions  . Amlodipine Besy-Benazepril Hcl Anaphylaxis  . Ivp Dye [Iodinated Diagnostic Agents] Anaphylaxis  . Shellfish Allergy Swelling and Anaphylaxis  . Shellfish-Derived Products Anaphylaxis  . Ace Inhibitors Swelling    Medications Reviewed Today    Reviewed by Travis, Catherine E, RPH (Pharmacist) on 09/04/19 at 1559  Med List Status: <None>  Medication Order Taking? Sig Documenting Provider Last Dose Status Informant  acetaminophen (TYLENOL) 500 MG tablet [id number] No Take 2 tablets (1,000 mg total) by mouth every 8 (eight) hours.  Patient not taking: Reported on  09/04/2019   Patel, Sunny, MD Not Taking Active   amLODipine (NORVASC) 5 MG tablet [id number] Yes TAKE 1 TABLET(5 MG) BY MOUTH DAILY Johnson, Megan P, DO Taking Active   atorvastatin (LIPITOR) 20 MG tablet [id number] Yes Take 1 tablet (20 mg total) by mouth daily. Johnson, Megan P, DO Taking Active   Cholecalciferol (VITAMIN Galvan-3) 5000 units TABS [id number] Yes Take 5,000 Units by mouth daily. [provider] Taking Active Self  Cyanocobalamin (VITAMIN B-12 PO) [id number] Yes Take by mouth 2 (two) times daily. [provider] Taking Active   DULoxetine (CYMBALTA) 60 MG capsule [id number] Yes Take 60 mg by mouth daily. [provider] Taking Active   empagliflozin (JARDIANCE) 10 MG TABS tablet [id number] Yes Take 10 mg by mouth daily. Johnson, Megan P, DO Taking Active   EPINEPHrine (EPIPEN 2-PAK) 0.3 mg/0.3 mL IJ SOAJ injection [id number]  Inject 0.3 mLs (0.3 mg total) into the muscle once. Take for severe allergic reaction, then come immediately to the Emergency Department or call 911. Forbach, Cory, MD  Active Self  fexofenadine (ALLEGRA) 180 MG tablet [id number] Yes TAKE 1 TABLET BY MOUTH EVERY DAY Johnson, Megan P, DO Taking Active   hydrochlorothiazide (HYDRODIURIL) 25 MG tablet [id number] Yes Take 1 tablet (25 mg total) by mouth daily. Johnson, Megan P, DO Taking Active   ketoconazole (NIZORAL) 2 % cream [id number] Yes Apply 1 application topically daily. Johnson, Megan P, DO Taking Active   Magnesium Oxide 500 MG CAPS [id number] Yes Take 1 capsule (500 mg   total) by mouth 2 (two) times daily at 8 am and 10 pm. Naveira, Francisco, MD Taking Active   metFORMIN (GLUCOPHAGE) 500 MG tablet [id number] Yes Take 1 tablet (500 mg total) by mouth 2 (two) times daily with a meal. Johnson, Megan P, DO Taking Active   montelukast (SINGULAIR) 10 MG tablet [id number] Yes Take 1 tablet (10 mg total) by mouth at bedtime. Johnson, Megan P, DO Taking Active   omeprazole (PRILOSEC) 40 MG capsule  [id number] Yes Take 1 capsule (40 mg total) by mouth daily before breakfast. Johnson, Megan P, DO Taking Active   pioglitazone (ACTOS) 30 MG tablet [id number] Yes TAKE 1 TABLET(30 MG) BY MOUTH DAILY Johnson, Megan P, DO Taking Active   pregabalin (LYRICA) 50 MG capsule [id number] Yes Take 1 capsule (50 mg total) by mouth 3 (three) times daily. Naveira, Francisco, MD Taking Active            Med Note (TRAVIS, CATHERINE E   Fri Sep 04, 2019  3:55 PM) BID   valACYclovir (VALTREX) 500 MG tablet [id number] No Take 500 mg by mouth 2 (two) times daily as needed (for outbreak).  [provider] Not Taking Active Self           Med Note (Lisa Galvan   Thu Jul 30, 2019 10:53 AM) Long time ago  Med List Note (Patterson, Delores G, RN 05/07/19 1435): PA sent for pregabalin 50 mg via CMM key [id number] 05/07/19 LP                 Assessment:   Goals Addressed            This Visit's Progress     Patient Stated   . PharmD "I want to make sure I'm using the best medication" (pt-stated)       CARE PLAN ENTRY (see longtitudinal plan of care for additional care plan information)  Current Barriers:  . Diabetes: controlled, but not optimally managed; complicated by chronic medical conditions including T2DM, HTN, chronic pain, most recent A1c 5.9% . Most recent eGFR: ~88 mL/min . Current antihyperglycemic regimen: metformin 500 mg BID, Jardiance 10 mg, pioglitazone 30 mg  o Notes she has never been on higher doses of metformin . Current blood glucose readings:  o Fastings: 100-110s . Cardiovascular risk reduction (follows w/ Dr. Kowalski); notes that her father had significant cardiovascular disease, CABG x2 starting when he was age ~69 o Current hypertensive regimen: HCTZ 25 mg, amlodipine 5 mg PM, last office BP at goal o Current hyperlipidemia regimen: atorvastatin 20 mg daily, last LDL 77 o Current antiplatelet regimen: n/a . Chronic pain: follows w/ Naviera; duloxetine 60 mg  daily, pregabalin 50 mg TID (had itching w/ higher does, but notes that she is doing well at this time on this dose)  Pharmacist Clinical Goal(s):  . Over the next 90 days, patient will work with PharmD and primary care provider to address optimized medication management  Interventions: . Comprehensive medication review performed, medication list updated in electronic medical record . Inter-disciplinary care team collaboration (see longitudinal plan of care) . Reviewed goal A1c, goal fasting, and goal 2 hour post prandial.  . Reviewed risk of bone fracture w/ pioglitazone. Given very well controlled A1c (and ability to titrate metformin or Jardiance in the future), recommend Galvan/c pioglitazone. Will discuss w/ PCP . Discussed goal LDL <70 given risk factor of HTN. Patient also has family history of ASCVD, though does not sound like premature ASCVD. Recommend increasing   atorvastatin to 40 mg daily.  . Reviewed need to schedule DEXA. Provided phone number to call and schedule . Will collaborate w/ office staff to call to schedule f/u with Dr. Wynetta Emery  Patient Self Care Activities:  . Patient will check blood glucose daily , document, and provide at future appointments . Patient will take medications as prescribed . Patient will report any questions or concerns to provider   Initial goal documentation        Plan: - Scheduled f/u call in ~ 12 weeks  Catie Darnelle Maffucci, PharmD, [place] [phone]

## 2019-09-07 DIAGNOSIS — E538 Deficiency of other specified B group vitamins: Secondary | ICD-10-CM | POA: Diagnosis not present

## 2019-09-07 DIAGNOSIS — G63 Polyneuropathy in diseases classified elsewhere: Secondary | ICD-10-CM | POA: Diagnosis not present

## 2019-09-07 DIAGNOSIS — R208 Other disturbances of skin sensation: Secondary | ICD-10-CM | POA: Diagnosis not present

## 2019-09-07 DIAGNOSIS — G20 Parkinson's disease: Secondary | ICD-10-CM | POA: Diagnosis not present

## 2019-09-09 ENCOUNTER — Ambulatory Visit
Admission: RE | Admit: 2019-09-09 | Discharge: 2019-09-09 | Disposition: A | Discharge status: Home / Self Care | Payer: BLUE CROSS/BLUE SHIELD | Source: Ambulatory Visit | Attending: Family Medicine | Admitting: Family Medicine

## 2019-09-09 DIAGNOSIS — S82842D Displaced bimalleolar fracture of left lower leg, subsequent encounter for closed fracture with routine healing: Secondary | ICD-10-CM | POA: Diagnosis not present

## 2019-09-09 DIAGNOSIS — Z13820 Encounter for screening for osteoporosis: Secondary | ICD-10-CM | POA: Diagnosis not present

## 2019-09-09 DIAGNOSIS — Z780 Asymptomatic menopausal state: Secondary | ICD-10-CM | POA: Diagnosis not present

## 2019-09-14 ENCOUNTER — Ambulatory Visit
Admission: RE | Admit: 2019-09-14 | Discharge: 2019-09-14 | Disposition: A | Discharge status: Home / Self Care | Payer: BLUE CROSS/BLUE SHIELD | Source: Ambulatory Visit | Attending: Surgery | Admitting: Surgery

## 2019-09-14 ENCOUNTER — Telehealth: Payer: Self-pay

## 2019-09-14 ENCOUNTER — Other Ambulatory Visit: Payer: Self-pay

## 2019-09-14 DIAGNOSIS — R1084 Generalized abdominal pain: Secondary | ICD-10-CM | POA: Diagnosis not present

## 2019-09-14 DIAGNOSIS — R933 Abnormal findings on diagnostic imaging of other parts of digestive tract: Secondary | ICD-10-CM

## 2019-09-14 DIAGNOSIS — K429 Umbilical hernia without obstruction or gangrene: Secondary | ICD-10-CM | POA: Diagnosis not present

## 2019-09-14 DIAGNOSIS — K8020 Calculus of gallbladder without cholecystitis without obstruction: Secondary | ICD-10-CM | POA: Diagnosis not present

## 2019-09-14 DIAGNOSIS — K869 Disease of pancreas, unspecified: Secondary | ICD-10-CM

## 2019-09-14 DIAGNOSIS — R9389 Abnormal findings on diagnostic imaging of other specified body structures: Secondary | ICD-10-CM

## 2019-09-14 NOTE — Telephone Encounter (Signed)
-----   Message from Leafy Ro, MD sent at 09/14/2019  9:04 AM EDT ----- Please let her know CT does show a hernia, It also shows some shadow in the pancreas and We will have to do a special CT to make sure it;s not something that we need to worry about. Please order CT abd with and without contrast with pancreatic protocol to evaluate potential pancreatic mass. ----- Message ----- From: Interface, Rad Results In Sent: 09/14/2019   9:00 AM EDT To: Leafy Ro, MD

## 2019-09-14 NOTE — Telephone Encounter (Signed)
Patient notified of CT results. I had spoken with Radiology about additional imaging requested and they said due to the patient's history of anaphylaxis with VI contrast that she should have an MRI abdomen with and without for possible pancreatic lesion.  Dr Everlene Farrier is agreeable and the patient is amendable to this.

## 2019-09-22 DIAGNOSIS — S82842D Displaced bimalleolar fracture of left lower leg, subsequent encounter for closed fracture with routine healing: Secondary | ICD-10-CM | POA: Diagnosis not present

## 2019-09-22 DIAGNOSIS — Z9181 History of falling: Secondary | ICD-10-CM | POA: Diagnosis not present

## 2019-09-22 DIAGNOSIS — Z7982 Long term (current) use of aspirin: Secondary | ICD-10-CM | POA: Diagnosis not present

## 2019-09-23 ENCOUNTER — Ambulatory Visit: Payer: BLUE CROSS/BLUE SHIELD | Admitting: Surgery

## 2019-09-23 DIAGNOSIS — Z9889 Other specified postprocedural states: Secondary | ICD-10-CM | POA: Diagnosis not present

## 2019-09-23 DIAGNOSIS — Z8781 Personal history of (healed) traumatic fracture: Secondary | ICD-10-CM | POA: Diagnosis not present

## 2019-09-24 ENCOUNTER — Other Ambulatory Visit: Payer: Self-pay

## 2019-09-24 ENCOUNTER — Ambulatory Visit
Admission: RE | Admit: 2019-09-24 | Discharge: 2019-09-24 | Disposition: A | Discharge status: Home / Self Care | Payer: BLUE CROSS/BLUE SHIELD | Source: Ambulatory Visit | Attending: Surgery | Admitting: Surgery

## 2019-09-24 DIAGNOSIS — R933 Abnormal findings on diagnostic imaging of other parts of digestive tract: Secondary | ICD-10-CM | POA: Diagnosis not present

## 2019-09-24 DIAGNOSIS — K429 Umbilical hernia without obstruction or gangrene: Secondary | ICD-10-CM | POA: Diagnosis not present

## 2019-09-24 DIAGNOSIS — K869 Disease of pancreas, unspecified: Secondary | ICD-10-CM | POA: Diagnosis not present

## 2019-09-24 MED ORDER — GADOBUTROL 1 MMOL/ML IV SOLN
9.0000 mL | Freq: Once | INTRAVENOUS | Status: AC | PRN
  Administered 2019-09-24: 9 mL via INTRAVENOUS

## 2019-09-25 ENCOUNTER — Telehealth: Payer: Self-pay

## 2019-09-25 DIAGNOSIS — S82842D Displaced bimalleolar fracture of left lower leg, subsequent encounter for closed fracture with routine healing: Secondary | ICD-10-CM | POA: Diagnosis not present

## 2019-09-25 DIAGNOSIS — Z9181 History of falling: Secondary | ICD-10-CM | POA: Diagnosis not present

## 2019-09-25 DIAGNOSIS — Z7982 Long term (current) use of aspirin: Secondary | ICD-10-CM | POA: Diagnosis not present

## 2019-09-25 NOTE — Telephone Encounter (Signed)
-----   Message from Leafy Ro, MD sent at 09/25/2019  9:50 AM EDT ----- Please let her know that MRI did not show any cancer or anything alarming, just the known hernia. May f/u w me at her convenience ----- Message ----- From: Interface, Rad Results In Sent: 09/25/2019   9:20 AM EDT To: Leafy Ro, MD

## 2019-09-25 NOTE — Telephone Encounter (Signed)
Patient notified of her MRI results. She is scheduled to follow up on Monday with Dr Everlene Farrier.

## 2019-09-25 NOTE — Telephone Encounter (Signed)
Message left for the patient to call back for her results.

## 2019-09-28 ENCOUNTER — Encounter: Payer: Self-pay | Admitting: Family Medicine

## 2019-09-28 ENCOUNTER — Ambulatory Visit (INDEPENDENT_AMBULATORY_CARE_PROVIDER_SITE_OTHER): Payer: BLUE CROSS/BLUE SHIELD | Admitting: Surgery

## 2019-09-28 ENCOUNTER — Ambulatory Visit (INDEPENDENT_AMBULATORY_CARE_PROVIDER_SITE_OTHER): Payer: BLUE CROSS/BLUE SHIELD | Admitting: Family Medicine

## 2019-09-28 ENCOUNTER — Other Ambulatory Visit: Payer: Self-pay

## 2019-09-28 ENCOUNTER — Encounter: Payer: Self-pay | Admitting: Surgery

## 2019-09-28 VITALS — BP 120/76 | HR 89 | Temp 98.4°F | Ht 63.0 in | Wt 192.0 lb

## 2019-09-28 VITALS — BP 138/82 | HR 91 | Temp 97.9°F | Resp 15 | Ht 63.0 in | Wt 198.0 lb

## 2019-09-28 DIAGNOSIS — K432 Incisional hernia without obstruction or gangrene: Secondary | ICD-10-CM

## 2019-09-28 DIAGNOSIS — E1140 Type 2 diabetes mellitus with diabetic neuropathy, unspecified: Secondary | ICD-10-CM

## 2019-09-28 DIAGNOSIS — E785 Hyperlipidemia, unspecified: Secondary | ICD-10-CM

## 2019-09-28 DIAGNOSIS — I10 Essential (primary) hypertension: Secondary | ICD-10-CM | POA: Diagnosis not present

## 2019-09-28 DIAGNOSIS — K219 Gastro-esophageal reflux disease without esophagitis: Secondary | ICD-10-CM

## 2019-09-28 LAB — MICROALBUMIN, URINE WAIVED
Creatinine, Urine Waived: 100 mg/dL (ref 10–300)
Microalb, Ur Waived: 80 mg/L — ABNORMAL HIGH (ref 0–19)

## 2019-09-28 LAB — BAYER DCA HB A1C WAIVED: HB A1C (BAYER DCA - WAIVED): 6.1 % (ref <7.0)

## 2019-09-28 MED ORDER — OMEPRAZOLE 40 MG PO CPDR: 40.0000 mg | DELAYED_RELEASE_CAPSULE | Freq: Every day | ORAL | 1 refills | Status: DC

## 2019-09-28 MED ORDER — HYDROCHLOROTHIAZIDE 25 MG PO TABS: 25.0000 mg | ORAL_TABLET | Freq: Every day | ORAL | 1 refills | Status: DC

## 2019-09-28 MED ORDER — EMPAGLIFLOZIN 10 MG PO TABS: 10.0000 mg | ORAL_TABLET | Freq: Every day | ORAL | 1 refills | Status: DC

## 2019-09-28 MED ORDER — ATORVASTATIN CALCIUM 20 MG PO TABS: 20.0000 mg | ORAL_TABLET | Freq: Every day | ORAL | 1 refills | Status: DC

## 2019-09-28 MED ORDER — EPINEPHRINE 0.3 MG/0.3ML IJ SOAJ: 0.3000 mg | Freq: Once | INTRAMUSCULAR | 0 refills | Status: AC

## 2019-09-28 MED ORDER — METFORMIN HCL 500 MG PO TABS: 500.0000 mg | ORAL_TABLET | Freq: Two times a day (BID) | ORAL | 1 refills | Status: DC

## 2019-09-28 MED ORDER — MONTELUKAST SODIUM 10 MG PO TABS: 10.0000 mg | ORAL_TABLET | Freq: Every day | ORAL | 1 refills | Status: DC

## 2019-09-28 MED ORDER — AMLODIPINE BESYLATE 5 MG PO TABS: ORAL_TABLET | ORAL | 1 refills | Status: DC

## 2019-09-28 NOTE — Assessment & Plan Note (Signed)
Under good control on current regimen. Continue current regimen. Continue to monitor. Call with any concerns. Refills given.   

## 2019-09-28 NOTE — Patient Instructions (Signed)
You have requested for your Umbilical Hernia be repaired. This has been scheduled on 10/06/19 by Dr. Everlene Farrier at [facility].  Please see your (blue)pre-care sheet for information.  You will need to arrange to be off work for 1-2 weeks but will have to have a lifting restriction of no more than 15 lbs for 6 weeks following your surgery.   Umbilical Hernia, Adult A hernia is a bulge of tissue that pushes through an opening between muscles. An umbilical hernia happens in the abdomen, near the belly button (umbilicus). The hernia may contain tissues from the small intestine, large intestine, or fatty tissue covering the intestines (omentum). Umbilical hernias in adults tend to get worse over time, and they require surgical treatment. There are several types of umbilical hernias. You may have:  A hernia located just above or below the umbilicus (indirect hernia). This is the most common type of umbilical hernia in adults.  A hernia that forms through an opening formed by the umbilicus (direct hernia).  A hernia that comes and goes (reducible hernia). A reducible hernia may be visible only when you strain, lift something heavy, or cough. This type of hernia can be pushed back into the abdomen (reduced).  A hernia that traps abdominal tissue inside the hernia (incarcerated hernia). This type of hernia cannot be reduced.  A hernia that cuts off blood flow to the tissues inside the hernia (strangulated hernia). The tissues can start to die if this happens. This type of hernia requires emergency treatment.  What are the causes? An umbilical hernia happens when tissue inside the abdomen presses on a weak area of the abdominal muscles. What increases the risk? You may have a greater risk of this condition if you:  Are obese.  Have had several pregnancies.  Have a buildup of fluid inside your abdomen (ascites).  Have had surgery that weakens the abdominal muscles.  What are the signs or  symptoms? The main symptom of this condition is a painless bulge at or near the belly button. A reducible hernia may be visible only when you strain, lift something heavy, or cough. Other symptoms may include:  Dull pain.  A feeling of pressure.  Symptoms of a strangulated hernia may include:  Pain that gets increasingly worse.  Nausea and vomiting.  Pain when pressing on the hernia.  Skin over the hernia becoming red or purple.  Constipation.  Blood in the stool.  How is this diagnosed? This condition may be diagnosed based on:  A physical exam. You may be asked to cough or strain while standing. These actions increase the pressure inside your abdomen and force the hernia through the opening in your muscles. Your health care provider may try to reduce the hernia by pressing on it.  Your symptoms and medical history.  How is this treated? Surgery is the only treatment for an umbilical hernia. Surgery for a strangulated hernia is done as soon as possible. If you have a small hernia that is not incarcerated, you may need to lose weight before having surgery. Follow these instructions at home:  Lose weight, if told by your health care provider.  Do not try to push the hernia back in.  Watch your hernia for any changes in color or size. Tell your health care provider if any changes occur.  You may need to avoid activities that increase pressure on your hernia.  Do not lift anything that is heavier than 10 lb (4.5 kg) until your health care  provider says that this is safe.  Take over-the-counter and prescription medicines only as told by your health care provider.  Keep all follow-up visits as told by your health care provider. This is important. Contact a health care provider if:  Your hernia gets larger.  Your hernia becomes painful. Get help right away if:  You develop sudden, severe pain near the area of your hernia.  You have pain as well as nausea or  vomiting.  You have pain and the skin over your hernia changes color.  You develop a fever. This information is not intended to replace advice given to you by your health care provider. Make sure you discuss any questions you have with your health care provider. Document Released: 09/09/2015 Document Revised: 12/11/2015 Document Reviewed: 09/09/2015 Elsevier Interactive Patient Education  [employer].

## 2019-09-28 NOTE — Progress Notes (Signed)
Outpatient Surgical Follow Up  09/28/2019  Lisa Galvan is an 59 y.o. female.   Chief Complaint  Patient presents with  . Follow-up    Umbilical Hernia    HPI: Lisa Galvan is a 59 year old female well-known to our practice with a history of prior open umbilical and epigastric hernia repair with ventraLex patch performed by Dr. Earlene Plater over 2 years ago ( 04/2017).   She does reports a recurrent bulge with some intermittent mild pain that is worsening with Valsalva.  Pain is sharp mild to moderate intensity.  No fevers no chills no evidence of bowel obstructions.    She did have a CT scan as well as an MRI that I have personally reviewed showing evidence of ventral hernia that is recurrent.  MRI done due to abnormal findings on the CT scan within the pancreas but the MRI did not show any suspicious lesions within the pancreas.  He was able to perform more than 4 METS of activity without shortness of breath or chest pain before. He has recovered from her left ankle fracture.    Past Medical History:  Diagnosis Date  . Allergy   . Arthritis    spine  . Bilateral leg edema   . Chest pain 12/15/2014  . Chronic sciatica 12/10/2014  . Essential hypertension 12/10/2014  . Gastroesophageal reflux disease without esophagitis   . Mixed hyperlipidemia   . Obesity (BMI 30.0-34.9) 12/10/2014  . Type 2 diabetes mellitus without complication (HCC) 12/10/2014  . Umbilical hernia     Past Surgical History:  Procedure Laterality Date  . ABDOMINAL HYSTERECTOMY    . COLONOSCOPY WITH PROPOFOL N/A 03/30/2019   Procedure: COLONOSCOPY WITH PROPOFOL;  Surgeon: Toney Reil, MD;  Location: [facility] END[facility]COPY;  Service: Gastroenterology;  Laterality: N/A;  . ESOPHAGOGASTRODUODENOSCOPY (EGD) WITH PROPOFOL N/A 03/12/2017   Procedure: ESOPHAGOGASTRODUODENOSCOPY (EGD) WITH PROPOFOL;  Surgeon: Toney Reil, MD;  Location: [facility] ENDOSCOPY;  Service: Gastroenterology;  Laterality: N/A;  . ESOPHAGOGASTRODUODENOSCOPY (EGD)  WITH PROPOFOL N/A 03/30/2019   Procedure: ESOPHAGOGASTRODUODENOSCOPY (EGD) WITH PROPOFOL;  Surgeon: Toney Reil, MD;  Location: [facility] ENDOSCOPY;  Service: Gastroenterology;  Laterality: N/A;  . HERNIA REPAIR    . ORIF ANKLE FRACTURE Left 07/30/2019   Procedure: OPEN REDUCTION INTERNAL FIXATION (ORIF) OF LEFT BIMALLEOLAR ANKLE FRACTURE;  Surgeon: Signa Kell, MD;  Location: [facility] SURGERY CNTR;  Service: Orthopedics;  Laterality: Left;  Diabetic - oral meds  . UMBILICAL HERNIA REPAIR N/A 05/01/2017   Procedure: HERNIA REPAIR UMBILICAL ADULT;  Surgeon: Ancil Linsey, MD;  Location: [facility] ORS;  Service: General;  Laterality: N/A;    Family History  Problem Relation Age of Onset  . Diabetes Mother   . Cancer Mother        breast and stomach  . Breast cancer Mother 74  . Dementia Mother   . Heart disease Father   . Kidney disease Father        dialysis  . Cancer Father        colon  . Diabetes Father   . Hypertension Sister   . Hypertension Brother   . Congestive Heart Failure Maternal Grandmother   . Diabetes Maternal Grandmother   . Hypertension Maternal Grandmother     Social History:  reports that she has never smoked. She has never used smokeless tobacco. She reports current alcohol use of about 3.0 standard drinks of alcohol per week. She reports that she does not use drugs.  Allergies:  Allergies  Allergen Reactions  .  Amlodipine Besy-Benazepril Hcl Anaphylaxis  . Ivp Dye [Iodinated Diagnostic Agents] Anaphylaxis  . Shellfish Allergy Swelling and Anaphylaxis  . Shellfish-Derived Products Anaphylaxis  . Ace Inhibitors Swelling    Medications reviewed.    ROS Full ROS performed and is otherwise negative other than what is stated in HPI   BP 138/82   Pulse 91   Temp 97.9 F (36.6 C)   Resp 15   Ht 5\' 3"  (1.6 m)   Wt 198 lb (89.8 kg)   SpO2 97%   BMI 35.07 kg/m   Physical Exam Vitals and nursing note reviewed. Exam conducted with a chaperone present.   Constitutional:      General: She is not in acute distress.    Appearance: Normal appearance. She is normal weight.  Eyes:     General: No scleral icterus.       Right eye: No discharge.        Left eye: No discharge.  Cardiovascular:     Rate and Rhythm: Normal rate and regular rhythm.     Heart sounds: No murmur.  Pulmonary:     Effort: Pulmonary effort is normal.     Breath sounds: Normal breath sounds.  Abdominal:     General: Abdomen is flat. There is no distension.     Palpations: Abdomen is soft. There is no mass.     Tenderness: There is no abdominal tenderness. There is no guarding or rebound.     Hernia: A hernia is present.     Comments: Reducible ventral hernia mild tenderness palpation.  No peritonitis  Musculoskeletal:     Cervical back: Normal range of motion and neck supple. No rigidity.  Skin:    General: Skin is warm and dry.     Capillary Refill: Capillary refill takes less than 2 seconds.  Neurological:     General: No focal deficit present.     Mental Status: She is alert.  Psychiatric:        Mood and Affect: Mood normal.        Behavior: Behavior normal.        Thought Content: Thought content normal.        Judgment: Judgment normal.        Results for orders placed or performed in visit on 09/28/19 (from the past 48 hour(s))  Bayer DCA Hb A1c Waived     Status: None   Collection Time: 09/28/19  9:59 AM  Result Value Ref Range   HB A1C (BAYER DCA - WAIVED) 6.1 <7.0 %    Comment:                                       Diabetic Adult            <7.0                                       Healthy Adult        4.3 - 5.7                                                           (  DCCT/NGSP) American Diabetes Association's Summary of Glycemic Recommendations for Adults with Diabetes: Hemoglobin A1c <7.0%. More stringent glycemic goals (A1c <6.0%) may further reduce complications at the cost of increased risk of hypoglycemia.   Microalbumin, Urine  Waived     Status: Abnormal   Collection Time: 09/28/19  9:59 AM  Result Value Ref Range   Microalb, Ur Waived 80 (H) 0 - 19 mg/L   Creatinine, Urine Waived 100 10 - 300 mg/dL   Microalb/Creat Ratio 30-300 (H) <30 mg/g    Comment:                              Abnormal:       30 - 300                         High Abnormal:           >300     Assessment/Plan: 59 year old female with recurrent ventral hernia that is symptomatic.  Discussed with the patient in detail about the need for repair.  Do think that she is a good candidate for robotic approach.  She has recovered well from her ankle injury and is able to walk well.  Discussed with her in detail and she wishes to schedule this as soon as possible.  Procedure discussed with the patient in detail.  Risks, benefits and possible complications including but not limited to: Bleeding, infection, bowel injuries, recurrence and mesh issues.  She understands and wishes to proceed.  Tensive counseling provided   Greater than 50% of the 40 minutes  visit was spent in counseling/coordination of care   Sterling Big, MD [facility] General Surgeon

## 2019-09-28 NOTE — Assessment & Plan Note (Signed)
Under good control on current regimen. Continue current regimen. Continue to monitor. Call with any concerns. Refills given. Labs drawn today.   

## 2019-09-28 NOTE — Assessment & Plan Note (Signed)
Sugars doing well at 6.2. Will stop actos and recheck 3 months. Continue to monitor. Call with any concerns. Refills for metformin and jardiance given today.

## 2019-09-28 NOTE — Progress Notes (Signed)
BP 120/76 (BP Location: Left Arm, Cuff Size: Normal)   Pulse 89   Temp 98.4 F (36.9 C) (Oral)   Ht 5\' 3"  (1.6 m)   Wt 192 lb (87.1 kg)   SpO2 98%   BMI 34.01 kg/m    Subjective:    Patient ID: Lisa Galvan, female    DOB: July 14, 1960, 59 y.o.   MRN: [id number]  HPI: Lisa Galvan is a 59 y.o. female  Chief Complaint  Patient presents with  . Gastroesophageal Reflux  . Hyperlipidemia  . Hypertension  . Diabetes  . Allergies    Rx for epi pen is expired  . Medication Refill    ketokonazole cream   Recovering from ankle surgery from a break. Healing well. Planning on getting another hernia surgery done before her daughter goes home at the end of the month.   DIABETES Hypoglycemic episodes:no Polydipsia/polyuria: no Visual disturbance: no Chest pain: no Paresthesias: no Glucose Monitoring: yes  Accucheck frequency: Daily  Fasting glucose:  Post prandial:  Evening:  Before meals: Taking Insulin?: no  Long acting insulin:  Short acting insulin: Blood Pressure Monitoring: not checking Retinal Examination: Up to Date Foot Exam: Up to Date Diabetic Education: Completed Pneumovax: Up to Date Influenza: Up to Date Aspirin: yes  HYPERTENSION / HYPERLIPIDEMIA Satisfied with current treatment? yes Duration of hypertension: chronic BP monitoring frequency: not checking BP medication side effects: no Duration of hyperlipidemia: chronic Cholesterol medication side effects: no Cholesterol supplements: none Past cholesterol medications: atorvastatin Medication compliance: excellent compliance Aspirin: yes Recent stressors: no Recurrent headaches: no Visual changes: no Palpitations: no Dyspnea: no Chest pain: no Lower extremity edema: no Dizzy/lightheaded: no  Relevant past medical, surgical, family and social history reviewed and updated as indicated. Interim medical history since our last visit reviewed. Allergies and medications reviewed and updated.  Review  of Systems  Constitutional: Negative.   HENT: Negative.   Respiratory: Negative.   Cardiovascular: Negative.   Gastrointestinal: Negative.   Musculoskeletal: Positive for arthralgias and gait problem. Negative for back pain, joint swelling, myalgias, neck pain and neck stiffness.  Skin: Negative.   Psychiatric/Behavioral: Negative.     Per HPI unless specifically indicated above     Objective:    BP 120/76 (BP Location: Left Arm, Cuff Size: Normal)   Pulse 89   Temp 98.4 F (36.9 C) (Oral)   Ht 5\' 3"  (1.6 m)   Wt 192 lb (87.1 kg)   SpO2 98%   BMI 34.01 kg/m   Wt Readings from Last 3 Encounters:  09/28/19 192 lb (87.1 kg)  09/02/19 200 lb (90.7 kg)  07/30/19 187 lb (84.8 kg)    Physical Exam Vitals and nursing note reviewed.  Constitutional:      General: She is not in acute distress.    Appearance: Normal appearance. She is not ill-appearing, toxic-appearing or diaphoretic.  HENT:     Head: Normocephalic and atraumatic.     Right Ear: External ear normal.     Left Ear: External ear normal.     Nose: Nose normal.     Mouth/Throat:     Mouth: Mucous membranes are moist.     Pharynx: Oropharynx is clear.  Eyes:     General: No scleral icterus.       Right eye: No discharge.        Left eye: No discharge.     Extraocular Movements: Extraocular movements intact.     Conjunctiva/sclera: Conjunctivae normal.     Pupils:  Pupils are equal, round, and reactive to light.  Cardiovascular:     Rate and Rhythm: Normal rate and regular rhythm.     Pulses: Normal pulses.     Heart sounds: Normal heart sounds. No murmur. No friction rub. No gallop.   Pulmonary:     Effort: Pulmonary effort is normal. No respiratory distress.     Breath sounds: Normal breath sounds. No stridor. No wheezing, rhonchi or rales.  Chest:     Chest wall: No tenderness.  Musculoskeletal:        General: Normal range of motion.     Cervical back: Normal range of motion and neck supple.  Skin:     General: Skin is warm and dry.     Capillary Refill: Capillary refill takes less than 2 seconds.     Coloration: Skin is not jaundiced or pale.     Findings: No bruising, erythema, lesion or rash.  Neurological:     General: No focal deficit present.     Mental Status: She is alert and oriented to person, place, and time. Mental status is at baseline.  Psychiatric:        Mood and Affect: Mood normal.        Behavior: Behavior normal.        Thought Content: Thought content normal.        Judgment: Judgment normal.     Results for orders placed or performed during the hospital encounter of 07/30/19  Glucose, capillary  Result Value Ref Range   Glucose-Capillary 100 (H) 70 - 99 mg/dL  Glucose, capillary  Result Value Ref Range   Glucose-Capillary 106 (H) 70 - 99 mg/dL      Assessment & Plan:   Problem List Items Addressed This Visit      Cardiovascular and Mediastinum   Essential hypertension    Under good control on current regimen. Continue current regimen. Continue to monitor. Call with any concerns. Refills given. Labs drawn today.       Relevant Medications   amLODipine (NORVASC) 5 MG tablet   atorvastatin (LIPITOR) 20 MG tablet   hydrochlorothiazide (HYDRODIURIL) 25 MG tablet   EPINEPHrine (EPIPEN 2-PAK) 0.3 mg/0.3 mL IJ SOAJ injection   Other Relevant Orders   Comprehensive metabolic panel   Microalbumin, Urine Waived     Digestive   Gastroesophageal reflux disease without esophagitis    Under good control on current regimen. Continue current regimen. Continue to monitor. Call with any concerns. Refills given.        Relevant Medications   omeprazole (PRILOSEC) 40 MG capsule     Endocrine   Type 2 diabetes mellitus with diabetic neuropathy, unspecified (HCC) - Primary    Sugars doing well at 6.2. Will stop actos and recheck 3 months. Continue to monitor. Call with any concerns. Refills for metformin and jardiance given today.      Relevant Medications    atorvastatin (LIPITOR) 20 MG tablet   empagliflozin (JARDIANCE) 10 MG TABS tablet   metFORMIN (GLUCOPHAGE) 500 MG tablet   Other Relevant Orders   Bayer DCA Hb A1c Waived   Comprehensive metabolic panel     Other   Hyperlipidemia    Under good control on current regimen. Continue current regimen. Continue to monitor. Call with any concerns. Refills given. Labs drawn today.       Relevant Medications   amLODipine (NORVASC) 5 MG tablet   atorvastatin (LIPITOR) 20 MG tablet   hydrochlorothiazide (HYDRODIURIL) 25 MG tablet  EPINEPHrine (EPIPEN 2-PAK) 0.3 mg/0.3 mL IJ SOAJ injection   Other Relevant Orders   Comprehensive metabolic panel   Lipid Panel w/o Chol/HDL Ratio       Follow up plan: Return in about 3 months (around 12/29/2019) for Physical.

## 2019-09-28 NOTE — H&P (View-Only) (Signed)
Outpatient Surgical Follow Up  09/28/2019  Lisa Galvan is an 59 y.o. female.   Chief Complaint  Patient presents with  . Follow-up    Umbilical Hernia    HPI: Lisa Galvan is a 59 year old female well-known to our practice with a history of prior open umbilical and epigastric hernia repair with ventraLex patch performed by Dr. Earlene Plater over 2 years ago ( 04/2017).   She does reports a recurrent bulge with some intermittent mild pain that is worsening with Valsalva.  Pain is sharp mild to moderate intensity.  No fevers no chills no evidence of bowel obstructions.    She did have a CT scan as well as an MRI that I have personally reviewed showing evidence of ventral hernia that is recurrent.  MRI done due to abnormal findings on the CT scan within the pancreas but the MRI did not show any suspicious lesions within the pancreas.  He was able to perform more than 4 METS of activity without shortness of breath or chest pain before. He has recovered from her left ankle fracture.    Past Medical History:  Diagnosis Date  . Allergy   . Arthritis    spine  . Bilateral leg edema   . Chest pain 12/15/2014  . Chronic sciatica 12/10/2014  . Essential hypertension 12/10/2014  . Gastroesophageal reflux disease without esophagitis   . Mixed hyperlipidemia   . Obesity (BMI 30.0-34.9) 12/10/2014  . Type 2 diabetes mellitus without complication (HCC) 12/10/2014  . Umbilical hernia     Past Surgical History:  Procedure Laterality Date  . ABDOMINAL HYSTERECTOMY    . COLONOSCOPY WITH PROPOFOL N/A 03/30/2019   Procedure: COLONOSCOPY WITH PROPOFOL;  Surgeon: Toney Reil, MD;  Location: [facility];  Service: Gastroenterology;  Laterality: N/A;  . ESOPHAGOGASTRODUODENOSCOPY (EGD) WITH PROPOFOL N/A 03/12/2017   Procedure: ESOPHAGOGASTRODUODENOSCOPY (EGD) WITH PROPOFOL;  Surgeon: Toney Reil, MD;  Location: [facility] ENDOSCOPY;  Service: Gastroenterology;  Laterality: N/A;  . ESOPHAGOGASTRODUODENOSCOPY (EGD)  WITH PROPOFOL N/A 03/30/2019   Procedure: ESOPHAGOGASTRODUODENOSCOPY (EGD) WITH PROPOFOL;  Surgeon: Toney Reil, MD;  Location: [facility] ENDOSCOPY;  Service: Gastroenterology;  Laterality: N/A;  . HERNIA REPAIR    . ORIF ANKLE FRACTURE Left 07/30/2019   Procedure: OPEN REDUCTION INTERNAL FIXATION (ORIF) OF LEFT BIMALLEOLAR ANKLE FRACTURE;  Surgeon: Signa Kell, MD;  Location: [facility] SUR[facility]ERY [facility];  Service: Orthopedics;  Laterality: Left;  Diabetic - oral meds  . UMBILICAL HERNIA REPAIR N/A 05/01/2017   Procedure: HERNIA REPAIR UMBILICAL ADULT;  Surgeon: Ancil Linsey, MD;  Location: [facility] ORS;  Service: General;  Laterality: N/A;    Family History  Problem Relation Age of Onset  . Diabetes Mother   . Cancer Mother        breast and stomach  . Breast cancer Mother 74  . Dementia Mother   . Heart disease Father   . Kidney disease Father        dialysis  . Cancer Father        colon  . Diabetes Father   . Hypertension Sister   . Hypertension Brother   . Congestive Heart Failure Maternal Grandmother   . Diabetes Maternal Grandmother   . Hypertension Maternal Grandmother     Social History:  reports that she has never smoked. She has never used smokeless tobacco. She reports current alcohol use of about 3.0 standard drinks of alcohol per week. She reports that she does not use drugs.  Allergies:  Allergies  Allergen Reactions  .  Amlodipine Besy-Benazepril Hcl Anaphylaxis  . Ivp Dye [Iodinated Diagnostic Agents] Anaphylaxis  . Shellfish Allergy Swelling and Anaphylaxis  . Shellfish-Derived Products Anaphylaxis  . Ace Inhibitors Swelling    Medications reviewed.    ROS Full ROS performed and is otherwise negative other than what is stated in HPI   BP 138/82   Pulse 91   Temp 97.9 F (36.6 C)   Resp 15   Ht 5\' 3"  (1.6 m)   Wt 198 lb (89.8 kg)   SpO2 97%   BMI 35.07 kg/m   Physical Exam Vitals and nursing note reviewed. Exam conducted with a chaperone present.   Constitutional:      General: She is not in acute distress.    Appearance: Normal appearance. She is normal weight.  Eyes:     General: No scleral icterus.       Right eye: No discharge.        Left eye: No discharge.  Cardiovascular:     Rate and Rhythm: Normal rate and regular rhythm.     Heart sounds: No murmur.  Pulmonary:     Effort: Pulmonary effort is normal.     Breath sounds: Normal breath sounds.  Abdominal:     General: Abdomen is flat. There is no distension.     Palpations: Abdomen is soft. There is no mass.     Tenderness: There is no abdominal tenderness. There is no guarding or rebound.     Hernia: A hernia is present.     Comments: Reducible ventral hernia mild tenderness palpation.  No peritonitis  Musculoskeletal:     Cervical back: Normal range of motion and neck supple. No rigidity.  Skin:    General: Skin is warm and dry.     Capillary Refill: Capillary refill takes less than 2 seconds.  Neurological:     General: No focal deficit present.     Mental Status: She is alert.  Psychiatric:        Mood and Affect: Mood normal.        Behavior: Behavior normal.        Thought Content: Thought content normal.        Judgment: Judgment normal.        Results for orders placed or performed in visit on 09/28/19 (from the past 48 hour(s))  Bayer DCA Hb A1c Waived     Status: None   Collection Time: 09/28/19  9:59 AM  Result Value Ref Range   HB A1C (BAYER DCA - WAIVED) 6.1 <7.0 %    Comment:                                       Diabetic Adult            <7.0                                       Healthy Adult        4.3 - 5.7                                                           (  DCCT/NGSP) American Diabetes Association's Summary of Glycemic Recommendations for Adults with Diabetes: Hemoglobin A1c <7.0%. More stringent glycemic goals (A1c <6.0%) may further reduce complications at the cost of increased risk of hypoglycemia.   Microalbumin, Urine  Waived     Status: Abnormal   Collection Time: 09/28/19  9:59 AM  Result Value Ref Range   Microalb, Ur Waived 80 (H) 0 - 19 mg/L   Creatinine, Urine Waived 100 10 - 300 mg/dL   Microalb/Creat Ratio 30-300 (H) <30 mg/g    Comment:                              Abnormal:       30 - 300                         High Abnormal:           >300     Assessment/Plan: 58-year-old female with recurrent ventral hernia that is symptomatic.  Discussed with the patient in detail about the need for repair.  Do think that she is a good candidate for robotic approach.  She has recovered well from her ankle injury and is able to walk well.  Discussed with her in detail and she wishes to schedule this as soon as possible.  Procedure discussed with the patient in detail.  Risks, benefits and possible complications including but not limited to: Bleeding, infection, bowel injuries, recurrence and mesh issues.  She understands and wishes to proceed.  Tensive counseling provided   Greater than 50% of the 40 minutes  visit was spent in counseling/coordination of care   Margareth Kanner, MD FACS General Surgeon 

## 2019-09-29 ENCOUNTER — Telehealth: Payer: Self-pay | Admitting: Surgery

## 2019-09-29 DIAGNOSIS — S82842D Displaced bimalleolar fracture of left lower leg, subsequent encounter for closed fracture with routine healing: Secondary | ICD-10-CM | POA: Diagnosis not present

## 2019-09-29 DIAGNOSIS — Z7982 Long term (current) use of aspirin: Secondary | ICD-10-CM | POA: Diagnosis not present

## 2019-09-29 DIAGNOSIS — Z9181 History of falling: Secondary | ICD-10-CM | POA: Diagnosis not present

## 2019-09-29 LAB — COMPREHENSIVE METABOLIC PANEL
ALT: 11 IU/L (ref 0–32)
AST: 15 IU/L (ref 0–40)
Albumin/Globulin Ratio: 2.3 — ABNORMAL HIGH (ref 1.2–2.2)
Albumin: 4.8 g/dL (ref 3.8–4.9)
Alkaline Phosphatase: 69 IU/L (ref 48–121)
BUN/Creatinine Ratio: 9 — ABNORMAL LOW (ref 12–28)
BUN: 7 mg/dL — ABNORMAL LOW (ref 8–27)
Bilirubin Total: 0.3 mg/dL (ref 0.0–1.2)
CO2: 32 mmol/L — ABNORMAL HIGH (ref 20–29)
Calcium: 10 mg/dL (ref 8.7–10.3)
Chloride: 96 mmol/L (ref 96–106)
Creatinine, Ser: 0.77 mg/dL (ref 0.57–1.00)
GFR calc Af Amer: 97 mL/min/{1.73_m2} (ref >59)
GFR calc non Af Amer: 84 mL/min/{1.73_m2} (ref >59)
Globulin, Total: 2.1 g/dL (ref 1.5–4.5)
Glucose: 132 mg/dL — ABNORMAL HIGH (ref 65–99)
Potassium: 3.6 mmol/L (ref 3.5–5.2)
Sodium: 143 mmol/L (ref 134–144)
Total Protein: 6.9 g/dL (ref 6.0–8.5)

## 2019-09-29 LAB — LIPID PANEL W/O CHOL/HDL RATIO
Cholesterol, Total: 232 mg/dL — ABNORMAL HIGH (ref 100–199)
HDL: 135 mg/dL (ref >39)
LDL Chol Calc (NIH): 72 mg/dL (ref 0–99)
Triglycerides: 157 mg/dL — ABNORMAL HIGH (ref 0–149)
VLDL Cholesterol Cal: 25 mg/dL (ref 5–40)

## 2019-09-29 NOTE — Telephone Encounter (Signed)
Pt has been advised of Pre-Admission date/time, COVID Testing date and Surgery date.  Surgery Date: 10/06/19 Preadmission Testing Date: 10/06/19 (office, arrive 2 hours early per pre-admit) Covid Testing Date: 10/02/19 - patient [facility] ([address] [facility]) between 8a-1p   Patient has been made aware to call [phone], between 1-3:00pm the day before surgery, to find out what time to arrive for surgery.

## 2019-10-01 DIAGNOSIS — S82842D Displaced bimalleolar fracture of left lower leg, subsequent encounter for closed fracture with routine healing: Secondary | ICD-10-CM | POA: Diagnosis not present

## 2019-10-01 DIAGNOSIS — Z9181 History of falling: Secondary | ICD-10-CM | POA: Diagnosis not present

## 2019-10-01 DIAGNOSIS — Z7982 Long term (current) use of aspirin: Secondary | ICD-10-CM | POA: Diagnosis not present

## 2019-10-02 ENCOUNTER — Other Ambulatory Visit: Payer: Self-pay

## 2019-10-02 ENCOUNTER — Other Ambulatory Visit
Admission: RE | Admit: 2019-10-02 | Discharge: 2019-10-02 | Disposition: A | Discharge status: Home / Self Care | Payer: BLUE CROSS/BLUE SHIELD | Source: Ambulatory Visit | Attending: Surgery | Admitting: Surgery

## 2019-10-02 DIAGNOSIS — Z20822 Contact with and (suspected) exposure to covid-19: Secondary | ICD-10-CM | POA: Diagnosis not present

## 2019-10-02 DIAGNOSIS — Z01812 Encounter for preprocedural laboratory examination: Secondary | ICD-10-CM | POA: Diagnosis not present

## 2019-10-02 LAB — SARS CORONAVIRUS 2 (TAT 6-24 HRS): SARS Coronavirus 2: NEGATIVE

## 2019-10-05 DIAGNOSIS — Z7982 Long term (current) use of aspirin: Secondary | ICD-10-CM | POA: Diagnosis not present

## 2019-10-05 DIAGNOSIS — Z9181 History of falling: Secondary | ICD-10-CM | POA: Diagnosis not present

## 2019-10-05 DIAGNOSIS — S82842D Displaced bimalleolar fracture of left lower leg, subsequent encounter for closed fracture with routine healing: Secondary | ICD-10-CM | POA: Diagnosis not present

## 2019-10-05 MED ORDER — CEFAZOLIN SODIUM-DEXTROSE 2-4 GM/100ML-% IV SOLN
2.0000 g | INTRAVENOUS | Status: AC
  Administered 2019-10-06: 2 g via INTRAVENOUS

## 2019-10-05 NOTE — Addendum Note (Signed)
Addended by: Sterling Big F on: 10/05/2019 02:02 PM   Modules accepted: Orders, SmartSet

## 2019-10-06 ENCOUNTER — Ambulatory Visit: Payer: BLUE CROSS/BLUE SHIELD | Admitting: Registered Nurse

## 2019-10-06 ENCOUNTER — Encounter: Payer: Self-pay | Admitting: Surgery

## 2019-10-06 ENCOUNTER — Encounter
Admission: RE | Disposition: A | Discharge status: Home / Self Care | Payer: Self-pay | Source: Home / Self Care | Attending: Surgery

## 2019-10-06 ENCOUNTER — Ambulatory Visit
Admission: RE | Admit: 2019-10-06 | Discharge: 2019-10-06 | Disposition: A | Discharge status: Home / Self Care | Payer: BLUE CROSS/BLUE SHIELD | Attending: Surgery | Admitting: Surgery

## 2019-10-06 ENCOUNTER — Other Ambulatory Visit: Payer: Self-pay

## 2019-10-06 DIAGNOSIS — E1140 Type 2 diabetes mellitus with diabetic neuropathy, unspecified: Secondary | ICD-10-CM | POA: Diagnosis not present

## 2019-10-06 DIAGNOSIS — G4730 Sleep apnea, unspecified: Secondary | ICD-10-CM | POA: Insufficient documentation

## 2019-10-06 DIAGNOSIS — M5430 Sciatica, unspecified side: Secondary | ICD-10-CM | POA: Diagnosis not present

## 2019-10-06 DIAGNOSIS — Z841 Family history of disorders of kidney and ureter: Secondary | ICD-10-CM | POA: Diagnosis not present

## 2019-10-06 DIAGNOSIS — Z90710 Acquired absence of both cervix and uterus: Secondary | ICD-10-CM | POA: Insufficient documentation

## 2019-10-06 DIAGNOSIS — Z800 Family history of malignant neoplasm of digestive organs: Secondary | ICD-10-CM | POA: Diagnosis not present

## 2019-10-06 DIAGNOSIS — Z6834 Body mass index (BMI) 34.0-34.9, adult: Secondary | ICD-10-CM | POA: Insufficient documentation

## 2019-10-06 DIAGNOSIS — Z888 Allergy status to other drugs, medicaments and biological substances status: Secondary | ICD-10-CM | POA: Insufficient documentation

## 2019-10-06 DIAGNOSIS — E782 Mixed hyperlipidemia: Secondary | ICD-10-CM | POA: Diagnosis not present

## 2019-10-06 DIAGNOSIS — Z833 Family history of diabetes mellitus: Secondary | ICD-10-CM | POA: Insufficient documentation

## 2019-10-06 DIAGNOSIS — I10 Essential (primary) hypertension: Secondary | ICD-10-CM | POA: Diagnosis not present

## 2019-10-06 DIAGNOSIS — Z91013 Allergy to seafood: Secondary | ICD-10-CM | POA: Insufficient documentation

## 2019-10-06 DIAGNOSIS — Z8249 Family history of ischemic heart disease and other diseases of the circulatory system: Secondary | ICD-10-CM | POA: Diagnosis not present

## 2019-10-06 DIAGNOSIS — E669 Obesity, unspecified: Secondary | ICD-10-CM | POA: Diagnosis not present

## 2019-10-06 DIAGNOSIS — M1990 Unspecified osteoarthritis, unspecified site: Secondary | ICD-10-CM | POA: Diagnosis not present

## 2019-10-06 DIAGNOSIS — E1142 Type 2 diabetes mellitus with diabetic polyneuropathy: Secondary | ICD-10-CM | POA: Insufficient documentation

## 2019-10-06 DIAGNOSIS — Z803 Family history of malignant neoplasm of breast: Secondary | ICD-10-CM | POA: Diagnosis not present

## 2019-10-06 DIAGNOSIS — Z91041 Radiographic dye allergy status: Secondary | ICD-10-CM | POA: Diagnosis not present

## 2019-10-06 DIAGNOSIS — Z820 Family history of epilepsy and other diseases of the nervous system: Secondary | ICD-10-CM | POA: Insufficient documentation

## 2019-10-06 DIAGNOSIS — K432 Incisional hernia without obstruction or gangrene: Secondary | ICD-10-CM | POA: Diagnosis not present

## 2019-10-06 DIAGNOSIS — K219 Gastro-esophageal reflux disease without esophagitis: Secondary | ICD-10-CM | POA: Insufficient documentation

## 2019-10-06 HISTORY — PX: XI ROBOTIC ASSISTED VENTRAL HERNIA: SHX6789

## 2019-10-06 LAB — GLUCOSE, CAPILLARY
Glucose-Capillary: 130 mg/dL — ABNORMAL HIGH (ref 70–99)
Glucose-Capillary: 162 mg/dL — ABNORMAL HIGH (ref 70–99)

## 2019-10-06 SURGERY — REPAIR, HERNIA, VENTRAL, ROBOT-ASSISTED
Anesthesia: General

## 2019-10-06 MED ORDER — ONDANSETRON HCL 4 MG/2ML IJ SOLN: 4.0000 mg | Freq: Once | INTRAMUSCULAR | Status: DC | PRN

## 2019-10-06 MED ORDER — ORAL CARE MOUTH RINSE: 15.0000 mL | Freq: Once | OROMUCOSAL | Status: AC

## 2019-10-06 MED ORDER — ROCURONIUM BROMIDE 10 MG/ML (PF) SYRINGE
PREFILLED_SYRINGE | INTRAVENOUS | Status: AC
  Filled 2019-10-06: qty 10

## 2019-10-06 MED ORDER — BUPIVACAINE LIPOSOME 1.3 % IJ SUSP
INTRAMUSCULAR | Status: DC | PRN
  Administered 2019-10-06: 20 mL

## 2019-10-06 MED ORDER — LIDOCAINE HCL (PF) 2 % IJ SOLN
INTRAMUSCULAR | Status: AC
  Filled 2019-10-06: qty 5

## 2019-10-06 MED ORDER — FENTANYL CITRATE (PF) 100 MCG/2ML IJ SOLN
INTRAMUSCULAR | Status: AC
  Filled 2019-10-06: qty 2

## 2019-10-06 MED ORDER — SODIUM CHLORIDE 0.9 % IV SOLN: INTRAVENOUS | Status: DC

## 2019-10-06 MED ORDER — BUPIVACAINE HCL (PF) 0.25 % IJ SOLN
INTRAMUSCULAR | Status: AC
  Filled 2019-10-06: qty 30

## 2019-10-06 MED ORDER — GABAPENTIN 300 MG PO CAPS
ORAL_CAPSULE | ORAL | Status: AC
  Administered 2019-10-06: 300 mg via ORAL
  Filled 2019-10-06: qty 1

## 2019-10-06 MED ORDER — FENTANYL CITRATE (PF) 100 MCG/2ML IJ SOLN: 25.0000 ug | INTRAMUSCULAR | Status: DC | PRN

## 2019-10-06 MED ORDER — CHLORHEXIDINE GLUCONATE CLOTH 2 % EX PADS: 6.0000 | MEDICATED_PAD | Freq: Once | CUTANEOUS | Status: DC

## 2019-10-06 MED ORDER — BUPIVACAINE HCL 0.25 % IJ SOLN
INTRAMUSCULAR | Status: DC | PRN
  Administered 2019-10-06: 30 mL

## 2019-10-06 MED ORDER — MIDAZOLAM HCL 2 MG/2ML IJ SOLN
INTRAMUSCULAR | Status: AC
  Filled 2019-10-06: qty 2

## 2019-10-06 MED ORDER — ACETAMINOPHEN 500 MG PO TABS: 1000.0000 mg | ORAL_TABLET | ORAL | Status: AC

## 2019-10-06 MED ORDER — PHENYLEPHRINE HCL (PRESSORS) 10 MG/ML IV SOLN
INTRAVENOUS | Status: DC | PRN
  Administered 2019-10-06 (×3): 100 ug via INTRAVENOUS

## 2019-10-06 MED ORDER — BUPIVACAINE LIPOSOME 1.3 % IJ SUSP
INTRAMUSCULAR | Status: AC
  Filled 2019-10-06: qty 20

## 2019-10-06 MED ORDER — EPINEPHRINE PF 1 MG/ML IJ SOLN
INTRAMUSCULAR | Status: AC
  Filled 2019-10-06: qty 1

## 2019-10-06 MED ORDER — SODIUM CHLORIDE 0.9 % IV SOLN
INTRAVENOUS | Status: DC | PRN
  Administered 2019-10-06: 20 ug/min via INTRAVENOUS

## 2019-10-06 MED ORDER — PROPOFOL 10 MG/ML IV BOLUS
INTRAVENOUS | Status: AC
  Filled 2019-10-06: qty 20

## 2019-10-06 MED ORDER — OXYCODONE HCL 5 MG PO TABS
ORAL_TABLET | ORAL | Status: AC
  Filled 2019-10-06: qty 1

## 2019-10-06 MED ORDER — GABAPENTIN 300 MG PO CAPS: 300.0000 mg | ORAL_CAPSULE | ORAL | Status: AC

## 2019-10-06 MED ORDER — SUCCINYLCHOLINE CHLORIDE 200 MG/10ML IV SOSY
PREFILLED_SYRINGE | INTRAVENOUS | Status: AC
  Filled 2019-10-06: qty 10

## 2019-10-06 MED ORDER — ROCURONIUM BROMIDE 100 MG/10ML IV SOLN
INTRAVENOUS | Status: DC | PRN
  Administered 2019-10-06: 40 mg via INTRAVENOUS
  Administered 2019-10-06: 10 mg via INTRAVENOUS

## 2019-10-06 MED ORDER — OXYCODONE HCL 5 MG PO TABS
5.0000 mg | ORAL_TABLET | Freq: Once | ORAL | Status: AC | PRN
  Administered 2019-10-06: 5 mg via ORAL

## 2019-10-06 MED ORDER — PHENYLEPHRINE HCL (PRESSORS) 10 MG/ML IV SOLN
INTRAVENOUS | Status: AC
  Filled 2019-10-06: qty 1

## 2019-10-06 MED ORDER — CHLORHEXIDINE GLUCONATE 0.12 % MT SOLN
OROMUCOSAL | Status: AC
  Administered 2019-10-06: 15 mL via OROMUCOSAL
  Filled 2019-10-06: qty 15

## 2019-10-06 MED ORDER — CELECOXIB 200 MG PO CAPS
ORAL_CAPSULE | ORAL | Status: AC
  Administered 2019-10-06: 200 mg via ORAL
  Filled 2019-10-06: qty 1

## 2019-10-06 MED ORDER — DEXMEDETOMIDINE HCL IN NACL 80 MCG/20ML IV SOLN
INTRAVENOUS | Status: AC
  Filled 2019-10-06: qty 20

## 2019-10-06 MED ORDER — CEFAZOLIN SODIUM-DEXTROSE 2-4 GM/100ML-% IV SOLN
INTRAVENOUS | Status: AC
  Filled 2019-10-06: qty 100

## 2019-10-06 MED ORDER — SUGAMMADEX SODIUM 200 MG/2ML IV SOLN
INTRAVENOUS | Status: DC | PRN
  Administered 2019-10-06: 200 mg via INTRAVENOUS

## 2019-10-06 MED ORDER — CHLORHEXIDINE GLUCONATE 0.12 % MT SOLN: 15.0000 mL | Freq: Once | OROMUCOSAL | Status: AC

## 2019-10-06 MED ORDER — DEXAMETHASONE SODIUM PHOSPHATE 10 MG/ML IJ SOLN
INTRAMUSCULAR | Status: DC | PRN
  Administered 2019-10-06: 10 mg via INTRAVENOUS

## 2019-10-06 MED ORDER — PROPOFOL 10 MG/ML IV BOLUS
INTRAVENOUS | Status: DC | PRN
  Administered 2019-10-06: 180 mg via INTRAVENOUS

## 2019-10-06 MED ORDER — DEXAMETHASONE SODIUM PHOSPHATE 10 MG/ML IJ SOLN
INTRAMUSCULAR | Status: AC
  Filled 2019-10-06: qty 1

## 2019-10-06 MED ORDER — LIDOCAINE HCL (CARDIAC) PF 100 MG/5ML IV SOSY
PREFILLED_SYRINGE | INTRAVENOUS | Status: DC | PRN
  Administered 2019-10-06: 100 mg via INTRAVENOUS

## 2019-10-06 MED ORDER — ONDANSETRON HCL 4 MG/2ML IJ SOLN
INTRAMUSCULAR | Status: DC | PRN
  Administered 2019-10-06: 4 mg via INTRAVENOUS

## 2019-10-06 MED ORDER — OXYCODONE HCL 5 MG/5ML PO SOLN: 5.0000 mg | Freq: Once | ORAL | Status: AC | PRN

## 2019-10-06 MED ORDER — MIDAZOLAM HCL 2 MG/2ML IJ SOLN
INTRAMUSCULAR | Status: DC | PRN
  Administered 2019-10-06: 2 mg via INTRAVENOUS

## 2019-10-06 MED ORDER — FENTANYL CITRATE (PF) 100 MCG/2ML IJ SOLN
INTRAMUSCULAR | Status: DC | PRN
  Administered 2019-10-06 (×2): 100 ug via INTRAVENOUS

## 2019-10-06 MED ORDER — CELECOXIB 200 MG PO CAPS: 200.0000 mg | ORAL_CAPSULE | ORAL | Status: AC

## 2019-10-06 MED ORDER — HYDROCODONE-ACETAMINOPHEN 5-325 MG PO TABS: 1.0000 | ORAL_TABLET | ORAL | 0 refills | Status: DC | PRN

## 2019-10-06 MED ORDER — ONDANSETRON HCL 4 MG/2ML IJ SOLN
INTRAMUSCULAR | Status: AC
  Filled 2019-10-06: qty 2

## 2019-10-06 MED ORDER — ACETAMINOPHEN 500 MG PO TABS
ORAL_TABLET | ORAL | Status: AC
  Administered 2019-10-06: 1000 mg via ORAL
  Filled 2019-10-06: qty 2

## 2019-10-06 SURGICAL SUPPLY — 47 items
BLADE SURG SZ11 CARB STEEL (BLADE) ×3 IMPLANT
CANISTER SUCT 1200ML W/VALVE (MISCELLANEOUS) ×3 IMPLANT
CHLORAPREP W/TINT 26 (MISCELLANEOUS) ×3 IMPLANT
COVER TIP SHEARS 8 DVNC (MISCELLANEOUS) ×1 IMPLANT
COVER TIP SHEARS 8MM DA VINCI (MISCELLANEOUS) ×2
COVER WAND RF STERILE (DRAPES) ×3 IMPLANT
DEFOGGER SCOPE WARMER CLEARIFY (MISCELLANEOUS) ×3 IMPLANT
DERMABOND ADVANCED (GAUZE/BANDAGES/DRESSINGS) ×2
DERMABOND ADVANCED .7 DNX12 (GAUZE/BANDAGES/DRESSINGS) ×1 IMPLANT
DRAPE 3/4 80X56 (DRAPES) ×3 IMPLANT
DRAPE ARM DVNC X/XI (DISPOSABLE) ×3 IMPLANT
DRAPE COLUMN DVNC XI (DISPOSABLE) ×1 IMPLANT
DRAPE DA VINCI XI ARM (DISPOSABLE) ×6
DRAPE DA VINCI XI COLUMN (DISPOSABLE) ×2
ELECT CAUTERY BLADE 6.4 (BLADE) ×3 IMPLANT
ELECT REM PT RETURN 9FT ADLT (ELECTROSURGICAL) ×3
ELECTRODE REM PT RTRN 9FT ADLT (ELECTROSURGICAL) ×1 IMPLANT
GLOVE BIO SURGEON STRL SZ7 (GLOVE) ×6 IMPLANT
GOWN STRL REUS W/ TWL LRG LVL3 (GOWN DISPOSABLE) ×3 IMPLANT
GOWN STRL REUS W/TWL LRG LVL3 (GOWN DISPOSABLE) ×6
GRASPER SUT TROCAR 14GX15 (MISCELLANEOUS) ×3 IMPLANT
IRRIGATION STRYKERFLOW (MISCELLANEOUS) IMPLANT
IRRIGATOR STRYKERFLOW (MISCELLANEOUS)
IV NS 1000ML (IV SOLUTION)
IV NS 1000ML BAXH (IV SOLUTION) IMPLANT
KIT PINK PAD W/HEAD ARE REST (MISCELLANEOUS) ×3
KIT PINK PAD W/HEAD ARM REST (MISCELLANEOUS) ×1 IMPLANT
LABEL OR SOLS (LABEL) ×3 IMPLANT
MESH VENT LT ST 11.4CM CRL (Mesh General) ×3 IMPLANT
NEEDLE HYPO 22GX1.5 SAFETY (NEEDLE) ×3 IMPLANT
OBTURATOR OPTICAL STANDARD 8MM (TROCAR) ×2
OBTURATOR OPTICAL STND 8 DVNC (TROCAR) ×1
OBTURATOR OPTICALSTD 8 DVNC (TROCAR) ×1 IMPLANT
PACK LAP CHOLECYSTECTOMY (MISCELLANEOUS) ×3 IMPLANT
PENCIL ELECTRO HAND CTR (MISCELLANEOUS) ×3 IMPLANT
SEAL CANN UNIV 5-8 DVNC XI (MISCELLANEOUS) ×3 IMPLANT
SEAL XI 5MM-8MM UNIVERSAL (MISCELLANEOUS) ×6
SET TUBE SMOKE EVAC HIGH FLOW (TUBING) ×3 IMPLANT
SOLUTION ELECTROLUBE (MISCELLANEOUS) ×3 IMPLANT
SPONGE LAP 18X18 RF (DISPOSABLE) ×3 IMPLANT
SUT MNCRL 4-0 (SUTURE) ×4
SUT MNCRL 4-0 27XMFL (SUTURE) ×2
SUT STRATAFIX PDS 30 CT-1 (SUTURE) ×3 IMPLANT
SUT VICRYL 0 AB UR-6 (SUTURE) ×6 IMPLANT
SUT VLOC 90 2/L VL 12 GS22 (SUTURE) ×6 IMPLANT
SUTURE MNCRL 4-0 27XMF (SUTURE) ×2 IMPLANT
TROCAR 130MM GELPORT  DAV (MISCELLANEOUS) ×3 IMPLANT

## 2019-10-06 NOTE — Op Note (Signed)
Robotic assisted laparoscopic ventral hernia Repair IPOM using  Round 11.4 cms ventralight BARD mesh   Pre-operative Diagnosis: recurrent ventral hernia   Post-operative Diagnosis: same   Surgeon: Sterling Big, MD FACS   Anesthesia: Gen. with endotracheal tube     Findings: 3 cm cephalad recurrent ventral hernia   Estimated Blood Loss:5 cc         Complications: none             Procedure Details  The patient was seen again in the Holding Room. The benefits, complications, treatment options, and expected outcomes were discussed with the patient. The risks of bleeding, infection, recurrence of symptoms, failure to resolve symptoms, bowel injury, mesh placement, mesh infection, any of which could require further surgery were reviewed with the patient. The likelihood of improving the patient's symptoms with return to their baseline status is good.  The patient and/or family concurred with the proposed plan, giving informed consent.  The patient was taken to Operating Room, identified and the procedure verified.  A Time Out was held and the above information confirmed.   Prior to the induction of general anesthesia, antibiotic prophylaxis was administered. VTE prophylaxis was in place. General endotracheal anesthesia was then administered and tolerated well. After the induction, the abdomen was prepped with Chloraprep and draped in the sterile fashion. The patient was positioned in the supine position.   We used a left upper quadrant subcostal incision and using a cutdown technique were able to identify the fascia both anteriorly and posteriorly elevated incised and 2 stay sutures were placed.  Hassan trocar inserted and pneumoperitoneum obtained.  No hemodynamic compromise.   2 additional 8 mm ports were placed under direct visualization.  I visualized the hernia and there was an epigastric ventral hernia measuring approximately 3 cm.  At this time I went ahead and inserted the round mesh with  an echo location.   The robot was brought to the surgical field and docked in the standard fashion.  We made sure that all instrumentation was kept under direct vision at all times and there was no collision between the arms.  I scrubbed out and went to the console.   Confirm and measured that the defect was 3 cm.  .  Using a 0V stratafix suture we closed the ventral defect primarily.  The PMI was used to pierce the defect in the center.  Using the mesh and the echo location device we were able to bring the mesh towards the abdominal wall. Falciform was taken down with electrocautery to allow adequate mesh placement.   The mesh was secured circumferentially to the abdominal wall using 2 OV lock in the standard fashion.   The mesh layed really nicely against the  abdominal wall. A second look laparoscopy revealed no evidence of intra-abdominal injury.    All the needles and foreign objects were removed under direct visualization.  The instruments were removed and the robot was undocked.  I scrubbed back in, The laparoscopic ports were removed under direct visualization and the pneumoperitoneum was deflated.   Incisions were closed with  4-0 Monocryl  And the fascial sutures approximated in the standard fashion Dermabond was used to coat the skin. Liposomal Marcaine was used to inject all the incision sites. Patient tolerated procedure well and there were no immediate complications. Needle and laparotomy counts were correct    Sterling Big, MD, FACS

## 2019-10-06 NOTE — Anesthesia Preprocedure Evaluation (Addendum)
Anesthesia Evaluation  Patient identified by MRN, date of birth, ID band Patient awake    Reviewed: Allergy & Precautions, H&P , NPO status , Patient's Chart, lab work & pertinent test results  Airway Mallampati: III  TM Distance: >3 FB    Comment: Large face Dental  (+) Teeth Intact   Pulmonary sleep apnea (probable, undiagnosed) , neg COPD,    breath sounds clear to auscultation       Cardiovascular hypertension, (-) angina(-) Past MI (-) dysrhythmias  Rhythm:regular Rate:Normal     Neuro/Psych neuropathy negative psych ROS   GI/Hepatic Neg liver ROS, GERD  Controlled,  Endo/Other  diabetes  Renal/GU      Musculoskeletal   Abdominal   Peds  Hematology negative hematology ROS (+)   Anesthesia Other Findings Obese  Past Medical History: No date: Allergy No date: Arthritis     Comment:  spine No date: Bilateral leg edema 12/15/2014: Chest pain 12/10/2014: Chronic sciatica 12/10/2014: Essential hypertension No date: Gastroesophageal reflux disease without esophagitis No date: Mixed hyperlipidemia 12/10/2014: Obesity (BMI 30.0-34.9) 12/10/2014: Type 2 diabetes mellitus without complication ([place]) No date: Umbilical hernia  Past Surgical History: No date: ABDOMINAL HYSTERECTOMY 03/30/2019: COLONOSCOPY WITH PROPOFOL; N/A     Comment:  Procedure: COLONOSCOPY WITH PROPOFOL;  Surgeon: Lin Landsman, MD;  Location: [facility] ENDOSCOPY;  Service:               Gastroenterology;  Laterality: N/A; 03/12/2017: ESOPHAGOGASTRODUODENOSCOPY (EGD) WITH PROPOFOL; N/A     Comment:  Procedure: ESOPHAGOGASTRODUODENOSCOPY (EGD) WITH               PROPOFOL;  Surgeon: Lin Landsman, MD;  Location:               [facility] ENDOSCOPY;  Service: Gastroenterology;  Laterality:               N/A; 03/30/2019: ESOPHAGOGASTRODUODENOSCOPY (EGD) WITH PROPOFOL; N/A     Comment:  Procedure: ESOPHAGOGASTRODUODENOSCOPY (EGD) WITH                PROPOFOL;  Surgeon: Lin Landsman, MD;  Location:               [facility] ENDOSCOPY;  Service: Gastroenterology;  Laterality:               N/A; No date: HERNIA REPAIR 07/30/2019: ORIF ANKLE FRACTURE; Left     Comment:  Procedure: OPEN REDUCTION INTERNAL FIXATION (ORIF) OF               LEFT BIMALLEOLAR ANKLE FRACTURE;  Surgeon: Leim Fabry,               MD;  Location: [place];  Service:               Orthopedics;  Laterality: Left;  Diabetic - oral meds 09/27/3417: UMBILICAL HERNIA REPAIR; N/A     Comment:  Procedure: HERNIA REPAIR UMBILICAL ADULT;  Surgeon:               Vickie Epley, MD;  Location: [facility] ORS;  Service:               General;  Laterality: N/A;  BMI    Body Mass Index: 34.01 kg/m      Reproductive/Obstetrics negative OB ROS  Anesthesia Physical Anesthesia Plan  ASA: III  Anesthesia Plan: General ETT   Post-op Pain Management:    Induction:   PONV Risk Score and Plan: Ondansetron, Dexamethasone, Midazolam and Treatment may vary due to age or medical condition  Airway Management Planned:   Additional Equipment:   Intra-op Plan:   Post-operative Plan:   Informed Consent: I have reviewed the patients History and Physical, chart, labs and discussed the procedure including the risks, benefits and alternatives for the proposed anesthesia with the patient or authorized representative who has indicated his/her understanding and acceptance.     Dental Advisory Given  Plan Discussed with: Anesthesiologist, CRNA and Surgeon  Anesthesia Plan Comments:        Anesthesia Quick Evaluation

## 2019-10-06 NOTE — Anesthesia Procedure Notes (Signed)
Procedure Name: Intubation Date/Time: 10/06/2019 7:40 AM Performed by: Karoline Caldwell, CRNA Pre-anesthesia Checklist: Patient identified, Patient being monitored, Timeout performed, Emergency Drugs available and Suction available Patient Re-evaluated:Patient Re-evaluated prior to induction Oxygen Delivery Method: Circle system utilized Preoxygenation: Pre-oxygenation with 100% oxygen Induction Type: IV induction Ventilation: Mask ventilation without difficulty and Oral airway inserted - appropriate to patient size Laryngoscope Size: 3 and McGraph Grade View: Grade I Tube type: Oral Tube size: 7.0 mm Number of attempts: 1 Airway Equipment and Method: Stylet Placement Confirmation: ETT inserted through vocal cords under direct vision,  positive ETCO2 and breath sounds checked- equal and bilateral Secured at: 21 cm Tube secured with: Tape Dental Injury: Teeth and Oropharynx as per pre-operative assessment

## 2019-10-06 NOTE — Transfer of Care (Signed)
Immediate Anesthesia Transfer of Care Note  Patient: Lisa Galvan  Procedure(s) Performed: XI ROBOTIC ASSISTED VENTRAL HERNIA (N/A )  Patient Location: PACU  Anesthesia Type:General  Level of Consciousness: sedated  Airway & Oxygen Therapy: Patient Spontanous Breathing and Patient connected to face mask oxygen  Post-op Assessment: Report given to RN and Post -op Vital signs reviewed and stable  Post vital signs: Reviewed and stable  Last Vitals:  Vitals Value Taken Time  BP 121/80 10/06/19 0921  Temp 35.8 C 10/06/19 0921  Pulse 72 10/06/19 0925  Resp 13 10/06/19 0925  SpO2 96 % 10/06/19 0925  Vitals shown include unvalidated device data.  Last Pain:  Vitals:   10/06/19 0921  TempSrc:   PainSc: (P) Asleep         Complications: No complications documented.

## 2019-10-06 NOTE — Interval H&P Note (Signed)
History and Physical Interval Note:  10/06/2019 7:14 AM  Lisa Galvan  has presented today for surgery, with the diagnosis of Recurrent ventral hernia.  The various methods of treatment have been discussed with the patient and family. After consideration of risks, benefits and other options for treatment, the patient has consented to  Procedure(s): XI ROBOTIC ASSISTED VENTRAL HERNIA (N/A) as a surgical intervention.  The patient's history has been reviewed, patient examined, no change in status, stable for surgery.  I have reviewed the patient's chart and labs.  Questions were answered to the patient's satisfaction.     [place] F Cruzito Standre

## 2019-10-06 NOTE — Progress Notes (Signed)
   10/06/19 0700  Clinical Encounter Type  Visited With Patient  Visit Type Initial;Spiritual support  Referral From Chaplain  Consult/Referral To Chaplain  Chaplain briefly visited with patient. Chaplain asked if she could pray with patient and she said yes. Chaplain prayed and wished patient well.

## 2019-10-07 ENCOUNTER — Telehealth: Payer: Self-pay | Admitting: *Deleted

## 2019-10-07 NOTE — Telephone Encounter (Signed)
Spoke with patient to notify her Dr.Pabon says its OK to take the Ibuprofen 800 mg for 5 days along with the Hydrocodone to help with pain management. Patient was also given the billing department's information as she requested an itemized bill. Patient work note was also sent via MyChart. Patient verbalized understanding and has no further questions.

## 2019-10-07 NOTE — Telephone Encounter (Signed)
Patient had surgery on 10/06/19 with Dr Everlene Farrier for ventral hernia repair and she stated that her pain level is at a 8. She is taking hydrocodone and it is not helping. She wants to know if she can get something different. She also needs a work note for her job about being out of work

## 2019-10-07 NOTE — Anesthesia Postprocedure Evaluation (Signed)
Anesthesia Post Note  Patient: Lisa Galvan  Procedure(s) Performed: XI ROBOTIC ASSISTED VENTRAL HERNIA (N/A )  Patient location during evaluation: PACU Anesthesia Type: General Level of consciousness: awake and alert Pain management: pain level controlled Vital Signs Assessment: post-procedure vital signs reviewed and stable Respiratory status: spontaneous breathing, nonlabored ventilation and respiratory function stable Cardiovascular status: blood pressure returned to baseline and stable Postop Assessment: no apparent nausea or vomiting Anesthetic complications: no   No complications documented.   Last Vitals:  Vitals:   10/06/19 1011 10/06/19 1054  BP: 103/66 103/67  Pulse:  75  Resp: 16 16  Temp: (!) 36.1 C   SpO2: 94% 94%    Last Pain:  Vitals:   10/07/19 0824  TempSrc:   PainSc: 8                  Karleen Hampshire

## 2019-10-08 ENCOUNTER — Other Ambulatory Visit: Payer: Self-pay | Admitting: Family Medicine

## 2019-10-08 MED ORDER — KETOCONAZOLE 2 % EX CREA: 1.0000 "application " | TOPICAL_CREAM | Freq: Every day | CUTANEOUS | 1 refills | Status: DC

## 2019-10-08 NOTE — Telephone Encounter (Signed)
Copied from CRM [id number]. Topic: Quick Communication - Rx Refill/Question >> Oct 08, 2019  1:14 PM Jaquita Rector A wrote: Medication: ketoconazole (NIZORAL) 2 % cream,   Has the patient contacted their pharmacy? Yes.   (Agent: If no, request that the patient contact the pharmacy for the refill.) (Agent: If yes, when and what did the pharmacy advise?)  Preferred Pharmacy (with phone number or street name): [id number] [place], [place] - [address] AT [facility] OF [address] [facility]  Phone:  [phone] Fax:  [phone]     Agent: Please be advised that RX refills may take up to 3 business days. We ask that you follow-up with your pharmacy.

## 2019-10-09 ENCOUNTER — Encounter: Payer: Self-pay | Admitting: Surgery

## 2019-10-09 ENCOUNTER — Ambulatory Visit: Payer: Self-pay

## 2019-10-09 NOTE — Chronic Care Management (AMB) (Signed)
  Care Management   Follow Up Note   10/09/2019 Name: Marlisha Vanwyk MRN: [id number] DOB: [birth year]  Referred by: Dorcas Carrow, DO Reason for referral : Care Coordination   Zaelyn Noack is a 59 y.o. year old female who is a primary care patient of Dorcas Carrow, DO. The care management team was consulted for assistance with care management and care coordination needs.    Review of patient status, including review of consultants reports, relevant laboratory and other test results, and collaboration with appropriate care team members and the patient's provider was performed as part of comprehensive patient evaluation and provision of chronic care management services.    LCSW completed CCM outreach attempt today but was unable to reach patient successfully. A HIPPA compliant voice message was left encouraging patient to return call once available. LCSW rescheduled CCM SW appointment as well.  A HIPPA compliant phone message was left for the patient providing contact information and requesting a return call.   Dickie La, BSW, MSW, LCSW [facility] [place]  [facility] [email or web address] Phone: [phone]

## 2019-10-15 ENCOUNTER — Encounter: Payer: BLUE CROSS/BLUE SHIELD | Admitting: Family Medicine

## 2019-10-16 ENCOUNTER — Telehealth: Payer: Self-pay | Admitting: General Practice

## 2019-10-16 ENCOUNTER — Ambulatory Visit: Payer: Self-pay | Admitting: General Practice

## 2019-10-16 DIAGNOSIS — K439 Ventral hernia without obstruction or gangrene: Secondary | ICD-10-CM

## 2019-10-16 DIAGNOSIS — M79644 Pain in right finger(s): Secondary | ICD-10-CM

## 2019-10-16 DIAGNOSIS — S82842D Displaced bimalleolar fracture of left lower leg, subsequent encounter for closed fracture with routine healing: Secondary | ICD-10-CM

## 2019-10-16 NOTE — Patient Instructions (Signed)
Visit Information  Goals Addressed              This Visit's Progress   .  RNCM: Pt-"I do not have any one to help me bathe" (pt-stated)        CARE PLAN ENTRY (see longtitudinal plan of care for additional care plan information)  Current Barriers:  Marland Kitchen Knowledge Deficits related to how to obtain help in the home post fall with bimalleolar fracture to the left ankle needing surgical repair- surgery completed and the patient is recovering.  . Knowledge Deficits related to facial edema and possible cause due to medications as evidence of reactions in the past from medications- completed . Use of walker in the home due to left ankle fracture- ankle fracture repaired and the patient is working with PT in her home, is still non-weight bearing . Stress and depression related to current circumstances with fx or ankle in need of surgical repair- working with LCSW . Lacks caregiver support.  . Film/video editor.  . Transportation barriers  Nurse Case Manager Clinical Goal(s):  Marland Kitchen Over the next 30 days, patient will verbalize understanding of plan for surgical repair of left ankle fracture and needed resources in the home as she recovers from injury/surgery- completed surgery completed and the patient is recovering at home . Over the next 120 days, patient will work with pcp, RNCM and CCM team to address needs related to in home help, community resources, home health and other needs the patient has until she can return to baseline health status . Over the next 120 days, patient will demonstrate a decrease in fall  exacerbations as evidenced by no new falls with injury . Over the next 60 days, patient will attend all scheduled medical appointments: Sees orthopedic provider today for plan of care related to fractured ankle- completed . Over the next 120 days, patient will demonstrate improved adherence to prescribed treatment plan for ankle fracture  as evidenced bysurgical repair of ankle fracture-  completed now in the recovery phase and recovery to baseline status . Over the next 60 days, patient will demonstrate improved health management independence as evidenced byno new issues related to facial edema- completed . Over the next 60 days, patient will work with CM team pharmacist to determine possible medications that can cause facial swelling. The patient recently had an increase in her Lyrica dosage . Over the next 60 days, patient will work with CM clinical social worker to assist with the acute onset of depression and anxiety related to injury and the need for surgical intervention.  The patient does not have help in the home and needs assistance . Over the next 30 days, patient will work with care guides  to assist with resources in the community to help the patient meet her needs- completed  Interventions:  . Evaluation of current treatment plan related to ankle fracture and facial edema and patient's adherence to plan as established by provider. 10-16-2019: The patient has recovered from her ankle surgery. Just had surgery for hernia repair 10 days ago and is recovering well. States she is a little sore but other than that she is doing well.  . Advised patient to let the RNCM know of surgical plans and any new needs that may arise.  The patient has had surgery and is recovering at home. Her daughter and granddaughter are here from [place] helping the patient. 10-16-2019: the patient states her daughter has been here for 3 months and just left this week  to go back home. She went and got groceries and made sure she has everything she needs. The patient will reach out to the [facility] for any needs she has like transportation.  . Provided education to patient re: RNCM team involvement to help her meet her health and wellness needs . Reviewed medications with patient and discussed the Lyrica.  The patient  has been taking this medication x 2  months. Did not seem to be having an issue until dosage was  increased. Will consult with CCM pharmacist for help. Ongoing support  . Collaborated with CCM team and pcp  regarding patients immediate needs and concerns- the patient got all of her refills except the EPI pen. She ask if the [facility] could ask Dr. Laural Benes about an EPI pen for her. Will send an in basket message to Dr. Laural Benes requiring about EPI pen.  . Discussed plans with patient for ongoing care management follow up and provided patient with direct contact information for care management team . Reviewed scheduled/upcoming provider appointments including: Orthopedic appointment today at 1130. Future pcp appointment for discussion of bone density test and other needs. Has orthopedic appointment on 11-04-2019.  Follows up with the surgeon also.  . Evaluation of current needs.  The patient is doing well now. She has had the wound vac removed and no more incidence of bleeding has occurred. The patient states that she is working with PT and they are changing her bandages when they come. She has a post follow up on 4/26 with orthopedic provider to take the remainder of her stables out. She can not bare weight on her foot for at least 4 more weeks. The patient's daughter and granddaughter are currently here from [place] helping her. She is thankful for the help she is getting from them. Denies any concerns at this time. 10-16-2019: The patient feels she is doing well and feels confident about staying at home by herself. The patient denies any acute distress. Will continue to monitor.  . Care Guide referral for resources in the community to help while the patient is unable to independently care for self . Social Work referral for acute onset of depression and anxiety related to fall with injury and the need for surgical intervention . Pharmacy referral for medication review to see if medication the patient is currently taking could cause facial edema. The patient ask about an EPI pen. Will collaborate with pcp and  pharmacist for assistance with RX.  Marland Kitchen Evaluation of patients statement of feeling she is allergic to latex. The patient has noticed with the bandages she has had that her skin had a rash. She removed the tape and took benadryl. This relieved the itching symptoms. Feels she has a new allergy to Latex. Will let the pcp know.   Patient Self Care Activities:  . Patient verbalizes understanding of plan to have CCM team work with the patient to meet needs during acute onset of fall with injury and the need for surgical intervention to left ankle . Attends all scheduled provider appointments . Performs ADL's independently . Performs IADL's independently . Calls provider office for new concerns or questions . Unable to independently care for self post fall with injury resulting in ankle fracture needing surgical intervention . Lacks social connections . Unable to perform ADLs independently . Unable to perform IADLs independently  Please see past updates related to this goal by clicking on the "Past Updates" button in the selected goal  Patient verbalizes understanding of instructions provided today.   The care management team will reach out to the patient again over the next 30 to 60 days.   [name] RN, MSN, [facility] Mobile: [phone]

## 2019-10-16 NOTE — Chronic Care Management (AMB) (Signed)
Care Management   Follow Up Note   10/16/2019 Name: Lisa Galvan MRN: [id number] DOB: 26-Mar-1961  Referred by: Dorcas Carrow, DO Reason for referral : Care Coordination (Follow up: Chronic Conditions and care coordination needs)   Lisa Galvan is a 59 y.o. year old female who is a primary care patient of Dorcas Carrow, DO. The care management team was consulted for assistance with care management and care coordination needs.    Review of patient status, including review of consultants reports, relevant laboratory and other test results, and collaboration with appropriate care team members and the patient's provider was performed as part of comprehensive patient evaluation and provision of chronic care management services.    SDOH (Social Determinants of Health) assessments performed: Yes See Care Plan activities for detailed interventions related to [facility])     Advanced Directives: See Care Plan and Vynca application for related entries.   Goals Addressed              This Visit's Progress   .  RNCM: Pt-"I do not have any one to help me bathe" (pt-stated)        CARE PLAN ENTRY (see longtitudinal plan of care for additional care plan information)  Current Barriers:  Marland Kitchen Knowledge Deficits related to how to obtain help in the home post fall with bimalleolar fracture to the left ankle needing surgical repair- surgery completed and the patient is recovering.  . Knowledge Deficits related to facial edema and possible cause due to medications as evidence of reactions in the past from medications- completed . Use of walker in the home due to left ankle fracture- ankle fracture repaired and the patient is working with PT in her home, is still non-weight bearing . Stress and depression related to current circumstances with fx or ankle in need of surgical repair- working with LCSW . Lacks caregiver support.  . Corporate treasurer.  . Transportation barriers  Nurse Case Manager  Clinical Goal(s):  Marland Kitchen Over the next 30 days, patient will verbalize understanding of plan for surgical repair of left ankle fracture and needed resources in the home as she recovers from injury/surgery- completed surgery completed and the patient is recovering at home . Over the next 120 days, patient will work with pcp, RNCM and CCM team to address needs related to in home help, community resources, home health and other needs the patient has until she can return to baseline health status . Over the next 120 days, patient will demonstrate a decrease in fall  exacerbations as evidenced by no new falls with injury . Over the next 60 days, patient will attend all scheduled medical appointments: Sees orthopedic provider today for plan of care related to fractured ankle- completed . Over the next 120 days, patient will demonstrate improved adherence to prescribed treatment plan for ankle fracture  as evidenced bysurgical repair of ankle fracture- completed now in the recovery phase and recovery to baseline status . Over the next 60 days, patient will demonstrate improved health management independence as evidenced byno new issues related to facial edema- completed . Over the next 60 days, patient will work with CM team pharmacist to determine possible medications that can cause facial swelling. The patient recently had an increase in her Lyrica dosage . Over the next 60 days, patient will work with CM clinical social worker to assist with the acute onset of depression and anxiety related to injury and the need for surgical intervention.  The patient does not have  help in the home and needs assistance . Over the next 30 days, patient will work with care guides  to assist with resources in the community to help the patient meet her needs- completed  Interventions:  . Evaluation of current treatment plan related to ankle fracture and facial edema and patient's adherence to plan as established by provider.  10-16-2019: The patient has recovered from her ankle surgery. Just had surgery for hernia repair 10 days ago and is recovering well. States she is a little sore but other than that she is doing well.  . Advised patient to let the RNCM know of surgical plans and any new needs that may arise.  The patient has had surgery and is recovering at home. Her daughter and granddaughter are here from [place] helping the patient. 10-16-2019: the patient states her daughter has been here for 3 months and just left this week to go back home. She went and got groceries and made sure she has everything she needs. The patient will reach out to the [facility] for any needs she has like transportation.  . Provided education to patient re: RNCM team involvement to help her meet her health and wellness needs . Reviewed medications with patient and discussed the Lyrica.  The patient  has been taking this medication x 2  months. Did not seem to be having an issue until dosage was increased. Will consult with CCM pharmacist for help. Ongoing support  . Collaborated with CCM team and pcp  regarding patients immediate needs and concerns- the patient got all of her refills except the EPI pen. She ask if the [facility] could ask Dr. Laural Benes about an EPI pen for her. Will send an in basket message to Dr. Laural Benes requiring about EPI pen.  . Discussed plans with patient for ongoing care management follow up and provided patient with direct contact information for care management team . Reviewed scheduled/upcoming provider appointments including: Orthopedic appointment today at 1130. Future pcp appointment for discussion of bone density test and other needs. Has orthopedic appointment on 11-04-2019.  Follows up with the surgeon also.  . Evaluation of current needs.  The patient is doing well now. She has had the wound vac removed and no more incidence of bleeding has occurred. The patient states that she is working with PT and they are changing her  bandages when they come. She has a post follow up on 4/26 with orthopedic provider to take the remainder of her stables out. She can not bare weight on her foot for at least 4 more weeks. The patient's daughter and granddaughter are currently here from [place] helping her. She is thankful for the help she is getting from them. Denies any concerns at this time. 10-16-2019: The patient feels she is doing well and feels confident about staying at home by herself. The patient denies any acute distress. Will continue to monitor.  . Care Guide referral for resources in the community to help while the patient is unable to independently care for self . Social Work referral for acute onset of depression and anxiety related to fall with injury and the need for surgical intervention . Pharmacy referral for medication review to see if medication the patient is currently taking could cause facial edema. The patient ask about an EPI pen. Will collaborate with pcp and pharmacist for assistance with RX.  Marland Kitchen Evaluation of patients statement of feeling she is allergic to latex. The patient has noticed with the bandages she has had  that her skin had a rash. She removed the tape and took benadryl. This relieved the itching symptoms. Feels she has a new allergy to Latex. Will let the pcp know.   Patient Self Care Activities:  . Patient verbalizes understanding of plan to have CCM team work with the patient to meet needs during acute onset of fall with injury and the need for surgical intervention to left ankle . Attends all scheduled provider appointments . Performs ADL's independently . Performs IADL's independently . Calls provider office for new concerns or questions . Unable to independently care for self post fall with injury resulting in ankle fracture needing surgical intervention . Lacks social connections . Unable to perform ADLs independently . Unable to perform IADLs independently  Please see past updates  related to this goal by clicking on the "Past Updates" button in the selected goal          The care management team will reach out to the patient again over the next 30 to 60 days.   [name] RN, MSN, [facility] Mobile: [phone]

## 2019-10-21 ENCOUNTER — Ambulatory Visit (INDEPENDENT_AMBULATORY_CARE_PROVIDER_SITE_OTHER): Payer: BLUE CROSS/BLUE SHIELD | Admitting: Surgery

## 2019-10-21 ENCOUNTER — Other Ambulatory Visit: Payer: Self-pay

## 2019-10-21 ENCOUNTER — Encounter: Payer: Self-pay | Admitting: Surgery

## 2019-10-21 VITALS — BP 117/73 | HR 103 | Temp 96.6°F | Ht 63.0 in | Wt 194.8 lb

## 2019-10-21 DIAGNOSIS — K219 Gastro-esophageal reflux disease without esophagitis: Secondary | ICD-10-CM

## 2019-10-21 NOTE — Patient Instructions (Addendum)
Patient will have Barium Swallow Procedure on July 13th, 2021 at [facility] at 9:00am with arrival time at 8:45am. Patient is NOT to have anything to eat or drink 3 hours prior to procedure.  GENERAL POST-OPERATIVE PATIENT INSTRUCTIONS   FOLLOW-UP:  Please make an appointment with your physician in.  Call your physician immediately if you have any fevers greater than 102.5, drainage from you wound that is not clear or looks infected, persistent bleeding, increasing abdominal pain, problems urinating, or persistent nausea/vomiting.    WOUND CARE INSTRUCTIONS:  Keep a dry clean dressing on the wound if there is drainage. The initial bandage may be removed after 24 hours.  Once the wound has quit draining you may leave it open to air.  If clothing rubs against the wound or causes irritation and the wound is not draining you may cover it with a dry dressing during the daytime.  Try to keep the wound dry and avoid ointments on the wound unless directed to do so.  If the wound becomes bright red and painful or starts to drain infected material that is not clear, please contact your physician immediately.  If the wound is mildly pink and has a thick firm ridge underneath it, this is normal, and is referred to as a healing ridge.  This will resolve over the next 4-6 weeks.  DIET:  You may eat any foods that you can tolerate.  It is a good idea to eat a high fiber diet and take in plenty of fluids to prevent constipation.  If you do become constipated you may want to take a mild laxative or take ducolax tablets on a daily basis until your bowel habits are regular.  Constipation can be very uncomfortable, along with straining, after recent surgery.  ACTIVITY:  You are encouraged to cough and deep breath or use your incentive spirometer if you were given one, every 15-30 minutes when awake.  This will help prevent respiratory complications and low grade fevers post-operatively if you had a general anesthetic.   You may want to hug a pillow when coughing and sneezing to add additional support to the surgical area, if you had abdominal or chest surgery, which will decrease pain during these times.  You are encouraged to walk and engage in light activity for the next two weeks.  You should not lift more than 20 pounds during this time frame as it could put you at increased risk for complications.  Twenty pounds is roughly equivalent to a plastic bag of groceries.    MEDICATIONS:  Try to take narcotic medications and anti-inflammatory medications, such as tylenol, ibuprofen, naprosyn, etc., with food.  This will minimize stomach upset from the medication.  Should you develop nausea and vomiting from the pain medication, or develop a rash, please discontinue the medication and contact your physician.  You should not drive, make important decisions, or operate machinery when taking narcotic pain medication.  QUESTIONS:  Please feel free to call your physician or the hospital operator if you have any questions, and they will be glad to assist you.

## 2019-10-22 ENCOUNTER — Other Ambulatory Visit: Payer: Self-pay | Admitting: Family Medicine

## 2019-10-22 DIAGNOSIS — I10 Essential (primary) hypertension: Secondary | ICD-10-CM

## 2019-10-22 MED ORDER — EPINEPHRINE 0.3 MG/0.3ML IJ SOAJ: 0.3000 mg | Freq: Once | INTRAMUSCULAR | 12 refills | Status: AC

## 2019-10-22 NOTE — Progress Notes (Signed)
Outpatient Surgical Follow Up  10/22/2019  Lisa Galvan is an 59 y.o. female.   Chief Complaint  Patient presents with  . Routine Post Op    2 wk post op  (10/06/19 ventral hernia repair)    HPI: Status post robotic ventral hernia some soreness but her main issue is severe reflux and dysphagia. She is able to take liquids but apparently has significant issues when swallowing a solid meal. No fevers no chills some soreness around her abdominal wall where her operation was.  Past Medical History:  Diagnosis Date  . Allergy   . Arthritis    spine  . Bilateral leg edema   . Chest pain 12/15/2014  . Chronic sciatica 12/10/2014  . Essential hypertension 12/10/2014  . Gastroesophageal reflux disease without esophagitis   . Mixed hyperlipidemia   . Obesity (BMI 30.0-34.9) 12/10/2014  . Type 2 diabetes mellitus without complication (HCC) 12/10/2014  . Umbilical hernia     Past Surgical History:  Procedure Laterality Date  . ABDOMINAL HYSTERECTOMY    . COLONOSCOPY WITH PROPOFOL N/A 03/30/2019   Procedure: COLONOSCOPY WITH PROPOFOL;  Surgeon: Toney Reil, MD;  Location: [facility] ENDOSCOPY;  Service: Gastroenterology;  Laterality: N/A;  . ESOPHAGOGASTRODUODENOSCOPY (EGD) WITH PROPOFOL N/A 03/12/2017   Procedure: ESOPHAGOGASTRODUODENOSCOPY (EGD) WITH PROPOFOL;  Surgeon: Toney Reil, MD;  Location: [facility] ENDOSCOPY;  Service: Gastroenterology;  Laterality: N/A;  . ESOPHAGOGASTRODUODENOSCOPY (EGD) WITH PROPOFOL N/A 03/30/2019   Procedure: ESOPHAGOGASTRODUODENOSCOPY (EGD) WITH PROPOFOL;  Surgeon: Toney Reil, MD;  Location: [facility] ENDOSCOPY;  Service: Gastroenterology;  Laterality: N/A;  . HERNIA REPAIR    . ORIF ANKLE FRACTURE Left 07/30/2019   Procedure: OPEN REDUCTION INTERNAL FIXATION (ORIF) OF LEFT BIMALLEOLAR ANKLE FRACTURE;  Surgeon: Signa Kell, MD;  Location: [facility] SURGERY CNTR;  Service: Orthopedics;  Laterality: Left;  Diabetic - oral meds  . UMBILICAL HERNIA REPAIR N/A  05/01/2017   Procedure: HERNIA REPAIR UMBILICAL ADULT;  Surgeon: Ancil Linsey, MD;  Location: [facility] ORS;  Service: General;  Laterality: N/A;  . XI ROBOTIC ASSISTED VENTRAL HERNIA N/A 10/06/2019   Procedure: XI ROBOTIC ASSISTED VENTRAL HERNIA;  Surgeon: Leafy Ro, MD;  Location: [facility] ORS;  Service: General;  Laterality: N/A;    Family History  Problem Relation Age of Onset  . Diabetes Mother   . Cancer Mother        breast and stomach  . Breast cancer Mother 33  . Dementia Mother   . Heart disease Father   . Kidney disease Father        dialysis  . Cancer Father        colon  . Diabetes Father   . Hypertension Sister   . Hypertension Brother   . Congestive Heart Failure Maternal Grandmother   . Diabetes Maternal Grandmother   . Hypertension Maternal Grandmother     Social History:  reports that she has never smoked. She has never used smokeless tobacco. She reports current alcohol use of about 3.0 standard drinks of alcohol per week. She reports that she does not use drugs.  Allergies:  Allergies  Allergen Reactions  . Amlodipine Besy-Benazepril Hcl Anaphylaxis  . Ivp Dye [Iodinated Diagnostic Agents] Anaphylaxis    Pt is unaware   . Shellfish Allergy Swelling and Anaphylaxis  . Shellfish-Derived Products Anaphylaxis  . Ace Inhibitors Swelling    Medications reviewed.    ROS Full ROS performed and is otherwise negative other than what is stated in HPI   BP 117/73  Pulse (!) 103   Temp (!) 96.6 F (35.9 C) (Temporal)   Ht 5\' 3"  (1.6 m)   Wt 194 lb 12.8 oz (88.4 kg)   SpO2 95%   BMI 34.51 kg/m   Physical Exam Vitals and nursing note reviewed. Exam conducted with a chaperone present.  Constitutional:      Appearance: Normal appearance. She is normal weight.  Pulmonary:     Effort: Pulmonary effort is normal. No respiratory distress.     Breath sounds: No stridor.  Abdominal:     General: Abdomen is flat. There is no distension.     Palpations:  There is no mass.     Tenderness: There is no abdominal tenderness. There is no guarding or rebound.     Hernia: No hernia is present.     Comments: Mild incisional tenderness without peritonitis or infection. Incisions doing well  Musculoskeletal:        General: No swelling or tenderness. Normal range of motion.     Cervical back: Normal range of motion.  Skin:    General: Skin is warm and dry.     Capillary Refill: Capillary refill takes less than 2 seconds.  Neurological:     General: No focal deficit present.     Mental Status: She is alert and oriented to person, place, and time.  Psychiatric:        Mood and Affect: Mood normal.        Behavior: Behavior normal.        Thought Content: Thought content normal.        Judgment: Judgment normal.         Assessment/Plan: Doing very well from robotic hernia perspective but now her main issue is reflux and significant dysphagia. This is unrelated to her operation. We will start work-up with barium swallow. She is to continue to take PPI. From surgical perspective doing very well from ventral hernia without complications. We will see her after she completes her appropriate work-up  Greater than 50% of the 25 minutes  visit was spent in counseling/coordination of care   , MD [facility] General Surgeon

## 2019-10-22 NOTE — Telephone Encounter (Signed)
Routing to provider  

## 2019-10-22 NOTE — Telephone Encounter (Signed)
Requested medication (s) are due for refill today: Yes  Requested medication (s) are on the active medication list: No  Last refill:  4 years ago  Future visit scheduled: No  Notes to clinic:  Patient says epi was supposed to be reordered and she will need it for her trip out of town, unable to refill per protocol     Requested Prescriptions  Pending Prescriptions Disp Refills   EPINEPHrine (EPIPEN 2-PAK) 0.3 mg/0.3 mL IJ SOAJ injection 0.3 mL 0    Sig: Inject 0.3 mLs (0.3 mg total) into the muscle once for 1 dose. Take for severe allergic reaction, then come immediately to the Emergency Department or call 911.      Immunology: Antidotes Passed - 10/22/2019  2:24 PM      Passed - Valid encounter within last 12 months    Recent Outpatient Visits           3 weeks ago Type 2 diabetes mellitus with diabetic neuropathy, without long-term current use of insulin (HCC)   [facility] [place], Megan P, DO   3 months ago Closed bimalleolar fracture of left ankle with routine healing, subsequent encounter   [facility] [place], Megan P, DO   5 months ago Essential hypertension   [facility] [place], [place], DO   6 months ago Essential hypertension   [facility] [place], [place], DO   9 months ago Oral thrush   [employer], Salley Hews, [place]       Future Appointments             In 2 months Laural Benes, Oralia Rud, DO [employer], [facility]

## 2019-10-22 NOTE — Telephone Encounter (Signed)
Pt called in that for assistance. Pt says that she was told by the pharmacy that multiple of her Rx has not been received. Pt would like further assistance with this.   Pt says that she was seen and told that her Rx would be sent to pharmacy. (showing in system that they have been sent) confirmed pharmacy with pt, correct pharmacy on file.    metFORMIN (GLUCOPHAGE) 500 MG tablet  amLODipine (NORVASC) 5 MG tablet  atorvastatin (LIPITOR) 20 MG tablet empagliflozin (JARDIANCE) 10 MG TABS tablet  hydrochlorothiazide (HYDRODIURIL) 25 MG tablet  montelukast (SINGULAIR) 10 MG tablet omeprazole (PRILOSEC) 40 MG capsule

## 2019-10-22 NOTE — Telephone Encounter (Signed)
[facility] called and spoke to Lisa Galvan, [facility] about the below medication requests. He says all medications were received and the patient picked up 4 Rx on 09/25/19 and 3 Rx on 09/28/19, 30 day supply of each. He says he doesn't know if it's the patient or the insurance company, but she's been only picking up a 30 day supply at a time. I called the patient and advised of the above. She says she is going out of town on Saturday for 8 days and will just get the refill at the [facility] where she will be going. She also said that she never received her refill of the Epi pen and the social worker said she was letting the doctor know. I advised I will send this request to Dr. Laural Benes to refill before Saturday, if possible, so that she will have it on hand for the trip. She verbalized understanding.

## 2019-10-28 ENCOUNTER — Other Ambulatory Visit: Payer: BLUE CROSS/BLUE SHIELD

## 2019-11-02 ENCOUNTER — Encounter: Payer: BLUE CROSS/BLUE SHIELD | Admitting: Surgery

## 2019-11-03 ENCOUNTER — Other Ambulatory Visit: Payer: Self-pay | Admitting: Surgery

## 2019-11-03 ENCOUNTER — Telehealth: Payer: Self-pay

## 2019-11-03 ENCOUNTER — Ambulatory Visit
Admission: RE | Admit: 2019-11-03 | Discharge: 2019-11-03 | Disposition: A | Discharge status: Home / Self Care | Payer: BLUE CROSS/BLUE SHIELD | Source: Ambulatory Visit | Attending: Surgery | Admitting: Surgery

## 2019-11-03 ENCOUNTER — Other Ambulatory Visit: Payer: Self-pay

## 2019-11-03 DIAGNOSIS — K224 Dyskinesia of esophagus: Secondary | ICD-10-CM | POA: Diagnosis not present

## 2019-11-03 DIAGNOSIS — K219 Gastro-esophageal reflux disease without esophagitis: Secondary | ICD-10-CM | POA: Diagnosis not present

## 2019-11-03 NOTE — Telephone Encounter (Signed)
Left detailed message for patient to notify her that her recent Swallow Study was normal per Dr.Pabon. Advised patient to give our office a call back if she has any questions or concerns.

## 2019-11-03 NOTE — Telephone Encounter (Signed)
-----   Message from Leafy Ro, MD sent at 11/03/2019  3:44 PM EDT ----- Pleaqse let her know swallow study was nml ----- Message ----- From: Interface, Rad Results In Sent: 11/03/2019   9:48 AM EDT To: Leafy Ro, MD

## 2019-11-04 DIAGNOSIS — Z8781 Personal history of (healed) traumatic fracture: Secondary | ICD-10-CM | POA: Diagnosis not present

## 2019-11-04 DIAGNOSIS — Z9889 Other specified postprocedural states: Secondary | ICD-10-CM | POA: Diagnosis not present

## 2019-11-09 ENCOUNTER — Encounter: Payer: Self-pay | Admitting: Surgery

## 2019-11-09 ENCOUNTER — Other Ambulatory Visit: Payer: Self-pay

## 2019-11-09 ENCOUNTER — Ambulatory Visit (INDEPENDENT_AMBULATORY_CARE_PROVIDER_SITE_OTHER): Payer: BLUE CROSS/BLUE SHIELD | Admitting: Surgery

## 2019-11-09 VITALS — BP 126/84 | HR 93 | Temp 98.9°F | Resp 12 | Ht 63.0 in | Wt 193.0 lb

## 2019-11-09 DIAGNOSIS — K219 Gastro-esophageal reflux disease without esophagitis: Secondary | ICD-10-CM

## 2019-11-09 MED ORDER — SUCRALFATE 1 G PO TABS: 1.0000 g | ORAL_TABLET | Freq: Three times a day (TID) | ORAL | 2 refills | Status: DC

## 2019-11-09 MED ORDER — OMEPRAZOLE 40 MG PO CPDR: 40.0000 mg | DELAYED_RELEASE_CAPSULE | Freq: Two times a day (BID) | ORAL | 2 refills | Status: DC

## 2019-11-09 NOTE — Patient Instructions (Signed)
Dr.Pabon recommends patient to try Omeprazole 40 mg twice a day along with prescription Sucralfate. Patient will follow up with Dr.Pabon 01/20/20 at 1:30pm.  Patient was given Return Work Note at today's visit.  GENERAL POST-OPERATIVE PATIENT INSTRUCTIONS   WOUND CARE INSTRUCTIONS:  Keep a dry clean dressing on the wound if there is drainage. The initial bandage may be removed after 24 hours.  Once the wound has quit draining you may leave it open to air.  If clothing rubs against the wound or causes irritation and the wound is not draining you may cover it with a dry dressing during the daytime.  Try to keep the wound dry and avoid ointments on the wound unless directed to do so.  If the wound becomes bright red and painful or starts to drain infected material that is not clear, please contact your physician immediately.  If the wound is mildly pink and has a thick firm ridge underneath it, this is normal, and is referred to as a healing ridge.  This will resolve over the next 4-6 weeks.  BATHING: You may shower if you have been informed of this by your surgeon. However, Please do not submerge in a tub, hot tub, or pool until incisions are completely sealed or have been told by your surgeon that you may do so.  DIET:  You may eat any foods that you can tolerate.  It is a good idea to eat a high fiber diet and take in plenty of fluids to prevent constipation.  If you do become constipated you may want to take a mild laxative or take ducolax tablets on a daily basis until your bowel habits are regular.  Constipation can be very uncomfortable, along with straining, after recent surgery.  ACTIVITY:  You are encouraged to cough and deep breath or use your incentive spirometer if you were given one, every 15-30 minutes when awake.  This will help prevent respiratory complications and low grade fevers post-operatively if you had a general anesthetic.  You may want to hug a pillow when coughing and sneezing  to add additional support to the surgical area, if you had abdominal or chest surgery, which will decrease pain during these times.  You are encouraged to walk and engage in light activity for the next two weeks.  You should not lift more than 20 pounds, until 4 to 6 weeks after surgery as it could put you at increased risk for complications.  Twenty pounds is roughly equivalent to a plastic bag of groceries. At that time- Listen to your body when lifting, if you have pain when lifting, stop and then try again in a few days. Soreness after doing exercises or activities of daily living is normal as you get back in to your normal routine.  MEDICATIONS:  Try to take narcotic medications and anti-inflammatory medications, such as tylenol, ibuprofen, naprosyn, etc., with food.  This will minimize stomach upset from the medication.  Should you develop nausea and vomiting from the pain medication, or develop a rash, please discontinue the medication and contact your physician.  You should not drive, make important decisions, or operate machinery when taking narcotic pain medication.  SUNBLOCK Use sun block to incision area over the next year if this area will be exposed to sun. This helps decrease scarring and will allow you avoid a permanent darkened area over your incision.  QUESTIONS:  Please feel free to call our office if you have any questions, and we will be  glad to assist you.

## 2019-11-11 ENCOUNTER — Encounter: Payer: Self-pay | Admitting: Surgery

## 2019-11-11 NOTE — Progress Notes (Signed)
Outpatient Surgical Follow Up  11/11/2019  Lisa Galvan is an 59 y.o. female.   Chief Complaint  Patient presents with  . Follow-up    barium swallow    HPI: Lisa Galvan is a 59 year old female with significant reflux requiring PPI Carafate.  She had a recent barium swallow that I have personally reviewed there is no evidence of significant hiatal hernia.  No evidence of any intraluminal lesions.  I have also reviewed her EGD performed by Dr. Allegra Lai December 2020 showing some gastritis.  She continues to have significant reflux and reports that the only thing that helps her is the Carafate.  She is taking once daily omeprazole  Past Medical History:  Diagnosis Date  . Allergy   . Arthritis    spine  . Bilateral leg edema   . Chest pain 12/15/2014  . Chronic sciatica 12/10/2014  . Essential hypertension 12/10/2014  . Gastroesophageal reflux disease without esophagitis   . Mixed hyperlipidemia   . Obesity (BMI 30.0-34.9) 12/10/2014  . Type 2 diabetes mellitus without complication (HCC) 12/10/2014  . Umbilical hernia     Past Surgical History:  Procedure Laterality Date  . ABDOMINAL HYSTERECTOMY    . COLONOSCOPY WITH PROPOFOL N/A 03/30/2019   Procedure: COLONOSCOPY WITH PROPOFOL;  Surgeon: Toney Reil, MD;  Location: [facility] ENDOSCOPY;  Service: Gastroenterology;  Laterality: N/A;  . ESOPHAGOGASTRODUODENOSCOPY (EGD) WITH PROPOFOL N/A 03/12/2017   Procedure: ESOPHAGOGASTRODUODENOSCOPY (EGD) WITH PROPOFOL;  Surgeon: Toney Reil, MD;  Location: [facility] ENDOSCOPY;  Service: Gastroenterology;  Laterality: N/A;  . ESOPHAGOGASTRODUODENOSCOPY (EGD) WITH PROPOFOL N/A 03/30/2019   Procedure: ESOPHAGOGASTRODUODENOSCOPY (EGD) WITH PROPOFOL;  Surgeon: Toney Reil, MD;  Location: [facility] ENDOSCOPY;  Service: Gastroenterology;  Laterality: N/A;  . HERNIA REPAIR    . ORIF ANKLE FRACTURE Left 07/30/2019   Procedure: OPEN REDUCTION INTERNAL FIXATION (ORIF) OF LEFT BIMALLEOLAR ANKLE FRACTURE;   Surgeon: Signa Kell, MD;  Location: [facility] SURGERY CNTR;  Service: Orthopedics;  Laterality: Left;  Diabetic - oral meds  . UMBILICAL HERNIA REPAIR N/A 05/01/2017   Procedure: HERNIA REPAIR UMBILICAL ADULT;  Surgeon: Ancil Linsey, MD;  Location: [facility] ORS;  Service: General;  Laterality: N/A;  . XI ROBOTIC ASSISTED VENTRAL HERNIA N/A 10/06/2019   Procedure: XI ROBOTIC ASSISTED VENTRAL HERNIA;  Surgeon: Leafy Ro, MD;  Location: [facility] ORS;  Service: General;  Laterality: N/A;    Family History  Problem Relation Age of Onset  . Diabetes Mother   . Cancer Mother        breast and stomach  . Breast cancer Mother 67  . Dementia Mother   . Heart disease Father   . Kidney disease Father        dialysis  . Cancer Father        colon  . Diabetes Father   . Hypertension Sister   . Hypertension Brother   . Congestive Heart Failure Maternal Grandmother   . Diabetes Maternal Grandmother   . Hypertension Maternal Grandmother     Social History:  reports that she has never smoked. She has never used smokeless tobacco. She reports current alcohol use of about 3.0 standard drinks of alcohol per week. She reports that she does not use drugs.  Allergies:  Allergies  Allergen Reactions  . Amlodipine Besy-Benazepril Hcl Anaphylaxis  . Ivp Dye [Iodinated Diagnostic Agents] Anaphylaxis    Pt is unaware   . Shellfish Allergy Swelling and Anaphylaxis  . Shellfish-Derived Products Anaphylaxis  . Ace Inhibitors Swelling  Medications reviewed.    ROS Full ROS performed and is otherwise negative other than what is stated in HPI   BP 126/84   Pulse 93   Temp 98.9 F (37.2 C) (Oral)   Resp 12   Ht 5\' 3"  (1.6 m)   Wt 193 lb (87.5 kg)   SpO2 100%   BMI 34.19 kg/m   Physical Exam Vitals and nursing note reviewed. Exam conducted with a chaperone present.  Constitutional:      General: She is not in acute distress.    Appearance: Normal appearance. She is normal weight.  Eyes:      General: No scleral icterus.       Right eye: No discharge.        Left eye: No discharge.     Extraocular Movements: Extraocular movements intact.  Pulmonary:     Effort: Pulmonary effort is normal. No respiratory distress.  Abdominal:     General: Abdomen is flat. There is no distension.     Tenderness: There is no abdominal tenderness. There is no guarding.     Hernia: No hernia is present.     Comments: Incisions c/c/i, no recurrence   Skin:    General: Skin is warm and dry.     Capillary Refill: Capillary refill takes less than 2 seconds.  Neurological:     General: No focal deficit present.     Mental Status: She is alert and oriented to person, place, and time.  Psychiatric:        Mood and Affect: Mood normal.        Behavior: Behavior normal.        Thought Content: Thought content normal.        Judgment: Judgment normal.        Assessment/Plan:  1. Gastroesophageal reflux disease without esophagitis  We will refill her Carafate and also increase the dose of omeprazole twice a day.  We will see her in a couple months and see her response to therapy.  Once this heals if she continues to have significant reflux she might be a candidate for antireflux surgery.  We are not quite there yet.   Greater than 50% of the 25 minutes  visit was spent in counseling/coordination of care   , MD [facility] General Surgeon

## 2019-11-18 ENCOUNTER — Telehealth: Payer: BLUE CROSS/BLUE SHIELD | Admitting: Pain Medicine

## 2019-11-30 ENCOUNTER — Telehealth: Payer: Self-pay

## 2019-12-01 ENCOUNTER — Telehealth: Payer: Self-pay

## 2019-12-01 ENCOUNTER — Telehealth: Payer: Self-pay | Admitting: Pharmacist

## 2019-12-01 ENCOUNTER — Telehealth: Payer: Self-pay | Admitting: *Deleted

## 2019-12-01 NOTE — Progress Notes (Signed)
  Chronic Care Management   Outreach Note  12/01/2019 Name: Lisa Galvan MRN: [id number] DOB: [birth year]  Referred by: Dorcas Carrow, DO Reason for referral : Chronic Care Management  An unsuccessful telephone outreach was attempted today. The patient was referred to the pharmacist for assistance with care management and care coordination.   Follow Up Plan: Left HIPAA compliant message to return my call at her convenience. Will collaborate with care guides for reschedule.     Mercer Pod. Tiburcio Pea PharmD, BCPS Clinical Pharmacist [facility] [phone]

## 2019-12-01 NOTE — Progress Notes (Signed)
  Chronic Care Management   Outreach Note  12/01/2019 Name: Lisa Galvan MRN: [id number] DOB: Nov 10, 1960  Referred by: Dorcas Carrow, DO Reason for referral : Chronic Care Management  An unsuccessful telephone outreach was attempted today. The patient was referred to the pharmacist for assistance with care management and care coordination.   Follow Up Plan: Left HIPAA compliant message  to return my call at her convenience. Will coordinate with care guide to reschedule.    Mercer Pod. Tiburcio Pea PharmD, BCPS Clinical Pharmacist [facility] [phone]

## 2019-12-01 NOTE — Telephone Encounter (Signed)
Faxed Physician Statement to [facility] at [phone]

## 2019-12-01 NOTE — Chronic Care Management (AMB) (Deleted)
  Chronic Care Management   Outreach Note  12/01/2019 Name: Lisa Galvan MRN: [id number] DOB: [birth year]  Referred by: Dorcas Carrow, DO Reason for referral : Chronic care management  An unsuccessful telephone outreach was attempted today. The patient was referred to the pharmacist for assistance with care management and care coordination.  Left HIPAA compliant message  to return my call at her convenience.  Will coordinate with care guides for outreach to reschedule  for continued medication management concerns.      Mercer Pod. Tiburcio Pea PharmD, BCPS Clinical Pharmacist [facility] [phone]

## 2019-12-04 ENCOUNTER — Ambulatory Visit: Payer: Self-pay | Admitting: Pharmacist

## 2019-12-04 DIAGNOSIS — I10 Essential (primary) hypertension: Secondary | ICD-10-CM

## 2019-12-04 DIAGNOSIS — E1140 Type 2 diabetes mellitus with diabetic neuropathy, unspecified: Secondary | ICD-10-CM

## 2019-12-04 NOTE — Progress Notes (Addendum)
Care Management   Follow Up Note   12/04/2019 Name: Lisa Galvan MRN: [id number] DOB: [birth year]  Referred by: [name], DO Reason for referral : Chronic Care Management (follow up()   Lisa Galvan is a 59 y.o. year old female who is a primary care patient of [name], DO. The care management team was consulted for assistance with care management and care coordination needs.    Review of patient status, including review of consultants reports, relevant laboratory and other test results, and collaboration with appropriate care team members and the patient's provider was performed as part of comprehensive patient evaluation and provision of chronic care management services.    SDOH (Social Determinants of Health) assessments performed: No See Care Plan activities for detailed interventions related to [facility])     Advanced Directives: See Care Plan and Vynca application for related entries.   Goals Addressed              This Visit's Progress   .  PharmD "I want to make sure I'm using the best medication" (pt-stated)        CARE PLAN ENTRY (see longtitudinal plan of care for additional care plan information)  Current Barriers:  . Diabetes: controlled, but not optimally managed; complicated by chronic medical conditions including T2DM, HTN, chronic pain, most recent A1c 5.9% . Most recent eGFR: ~88 mL/min . Current antihyperglycemic regimen: metformin 500 mg BID, Jardiance 10 mg,  o Notes she has never been on higher doses of metformin . Current blood glucose readings:  o Fastings: less than 128, reports one episode of hypoglycemia felt a little off balance BG was 75. Relieved by eating a peach o Would like prescription for new meter and strips Contour Next EZ. . Cardiovascular risk reduction (follows w/ Dr. Nehemiah Massed); notes that her father had significant cardiovascular disease, CABG x2 starting when he was age ~16 o Current hypertensive regimen: HCTZ 25 mg,  amlodipine 5 mg PM, last office BP at goal o Current hyperlipidemia regimen: atorvastatin 20 mg daily, last LDL 77 o Current antiplatelet regimen: n/a o Would like referral for sleep study . Chronic pain: follows w/ [facility]; duloxetine 60 mg daily, pregabalin 50 mg TID (had itching w/ higher does, but notes that she is doing well at this time on this dose)  Pharmacist Clinical Goal(s):  Marland Kitchen Over the next 90 days, patient will work with PharmD and primary care provider to address optimized medication management  Interventions: . Comprehensive medication review performed, medication list updated in electronic medical record . Inter-disciplinary care team collaboration (see longitudinal plan of care) . Reviewed goal A1c, goal fasting, and goal 2 hour post prandial.  . Discussed goal LDL <70 given risk factor of HTN. Patient also has family history of ASCVD, though does not sound like premature ASCVD. Recommend increasing atorvastatin to 40 mg daily.  . Reviewed need to schedule DEXA. Provided phone number to call and schedule   Patient Self Care Activities:  . Patient will check blood glucose daily , document, and provide at future appointments . Patient will take medications as prescribed . Patient will report any questions or concerns to provider   Please see past updates related to this goal by clicking on the "Past Updates" button in the selected goal          Follow up scheduled in 6 months.  Junita Push. Kenton Kingfisher PharmD, BCPS Clinical Pharmacist [facility] [phone]  I, as supervising physician, have reviewed the PharmD Chronic  Care Management note for this patient and concur with the findings and recommendations listed above.

## 2019-12-18 ENCOUNTER — Ambulatory Visit: Payer: Self-pay | Admitting: General Practice

## 2019-12-18 ENCOUNTER — Telehealth: Payer: Self-pay | Admitting: General Practice

## 2019-12-18 DIAGNOSIS — E785 Hyperlipidemia, unspecified: Secondary | ICD-10-CM

## 2019-12-18 DIAGNOSIS — I10 Essential (primary) hypertension: Secondary | ICD-10-CM

## 2019-12-18 DIAGNOSIS — E1140 Type 2 diabetes mellitus with diabetic neuropathy, unspecified: Secondary | ICD-10-CM

## 2019-12-18 DIAGNOSIS — S82842D Displaced bimalleolar fracture of left lower leg, subsequent encounter for closed fracture with routine healing: Secondary | ICD-10-CM

## 2019-12-18 NOTE — Telephone Encounter (Signed)
  Chronic Care Management   Note  12/18/2019 Name: Lisa Galvan MRN: [id number] DOB: 08/18/1960  Had left a message for the patient and the patient called back. See new encounter.   Follow up plan: Telephone follow up appointment with care management team member scheduled for: 02-19-2020 at 3:15 pm  Alto Denver RN, MSN, CCM Community Care Coordinator [place]  [facility] Mobile: [phone]

## 2019-12-18 NOTE — Patient Instructions (Signed)
Visit Information  Goals Addressed              This Visit's Progress     COMPLETED: RNCM: Pt-"I do not have any one to help me bathe" (pt-stated)        CARE PLAN ENTRY (see longtitudinal plan of care for additional care plan information)  Current Barriers: This goal has been completed. No further issues related to hernia and ankle fracture  Knowledge Deficits related to how to obtain help in the home post fall with bimalleolar fracture to the left ankle needing surgical repair- surgery completed and the patient is recovering.   Knowledge Deficits related to facial edema and possible cause due to medications as evidence of reactions in the past from medications- completed  Use of walker in the home due to left ankle fracture- ankle fracture repaired and the patient is working with PT in her home, is still non-weight bearing  Stress and depression related to current circumstances with fx or ankle in need of surgical repair- working with [employer] caregiver support.   Corporate treasurer.   Transportation barriers  Nurse Case Manager Clinical Goal(s):   Over the next 30 days, patient will verbalize understanding of plan for surgical repair of left ankle fracture and needed resources in the home as she recovers from injury/surgery- completed surgery completed and the patient is recovering at home  Over the next 120 days, patient will work with pcp, RNCM and CCM team to address needs related to in home help, community resources, home health and other needs the patient has until she can return to baseline health status  Over the next 120 days, patient will demonstrate a decrease in fall  exacerbations as evidenced by no new falls with injury  Over the next 60 days, patient will attend all scheduled medical appointments: Sees orthopedic provider today for plan of care related to fractured ankle- completed  Over the next 120 days, patient will demonstrate improved adherence to  prescribed treatment plan for ankle fracture  as evidenced bysurgical repair of ankle fracture- completed now in the recovery phase and recovery to baseline status  Over the next 60 days, patient will demonstrate improved health management independence as evidenced byno new issues related to facial edema- completed  Over the next 60 days, patient will work with CM team pharmacist to determine possible medications that can cause facial swelling. The patient recently had an increase in her Lyrica dosage  Over the next 60 days, patient will work with CM clinical social worker to assist with the acute onset of depression and anxiety related to injury and the need for surgical intervention.  The patient does not have help in the home and needs assistance  Over the next 30 days, patient will work with care guides  to assist with resources in the community to help the patient meet her needs- completed  Interventions:   Evaluation of current treatment plan related to ankle fracture and facial edema and patient's adherence to plan as established by provider. 10-16-2019: The patient has recovered from her ankle surgery. Just had surgery for hernia repair 10 days ago and is recovering well. States she is a little sore but other than that she is doing well.   Advised patient to let the RNCM know of surgical plans and any new needs that may arise.  The patient has had surgery and is recovering at home. Her daughter and granddaughter are here from [place] helping the patient. 10-16-2019: the patient  states her daughter has been here for 3 months and just left this week to go back home. She went and got groceries and made sure she has everything she needs. The patient will reach out to the [facility] for any needs she has like transportation.   Provided education to patient re: RNCM team involvement to help her meet her health and wellness needs  Reviewed medications with patient and discussed the Lyrica.  The patient   has been taking this medication x 2  months. Did not seem to be having an issue until dosage was increased. Will consult with CCM pharmacist for help. Ongoing support   Collaborated with CCM team and pcp  regarding patients immediate needs and concerns- the patient got all of her refills except the EPI pen. She ask if the [facility] could ask Dr. Laural Benes about an EPI pen for her. Will send an in basket message to Dr. Laural Benes requiring about EPI pen.   Discussed plans with patient for ongoing care management follow up and provided patient with direct contact information for care management team  Reviewed scheduled/upcoming provider appointments including: Orthopedic appointment today at 1130. Future pcp appointment for discussion of bone density test and other needs. Has orthopedic appointment on 11-04-2019.  Follows up with the surgeon also.   Evaluation of current needs.  The patient is doing well now. She has had the wound vac removed and no more incidence of bleeding has occurred. The patient states that she is working with PT and they are changing her bandages when they come. She has a post follow up on 4/26 with orthopedic provider to take the remainder of her stables out. She can not bare weight on her foot for at least 4 more weeks. The patient's daughter and granddaughter are currently here from [place] helping her. She is thankful for the help she is getting from them. Denies any concerns at this time. 10-16-2019: The patient feels she is doing well and feels confident about staying at home by herself. The patient denies any acute distress. Will continue to monitor.   Care Guide referral for resources in the community to help while the patient is unable to independently care for self  Social Work referral for acute onset of depression and anxiety related to fall with injury and the need for surgical intervention  Pharmacy referral for medication review to see if medication the patient is currently  taking could cause facial edema. The patient ask about an EPI pen. Will collaborate with pcp and pharmacist for assistance with RX.   Evaluation of patients statement of feeling she is allergic to latex. The patient has noticed with the bandages she has had that her skin had a rash. She removed the tape and took benadryl. This relieved the itching symptoms. Feels she has a new allergy to Latex. Will let the pcp know.   Patient Self Care Activities:   Patient verbalizes understanding of plan to have CCM team work with the patient to meet needs during acute onset of fall with injury and the need for surgical intervention to left ankle  Attends all scheduled provider appointments  Performs ADL's independently  Performs IADL's independently  Calls provider office for new concerns or questions  Unable to independently care for self post fall with injury resulting in ankle fracture needing surgical intervention  Lacks social connections  Unable to perform ADLs independently  Unable to perform IADLs independently  Please see past updates related to this goal by clicking on  the "Past Updates" button in the selected goal        RNCM: Pt-"I haven't had anymore low blood sugars recently" (pt-stated)        CARE PLAN ENTRY (see longtitudinal plan of care for additional care plan information)  Current Barriers:   Chronic Disease Management support, education, and care coordination needs related to HTN, HLD, and DMII  Clinical Goal(s) related to HTN, HLD, and DMII:  Over the next 120 days, patient will:   Work with the care management team to address educational, disease management, and care coordination needs   Begin or continue self health monitoring activities as directed today Measure and record cbg (blood glucose) 1 times daily, Measure and record blood pressure 2/3 times per week, and adhere to a heart healthy/ADA diet  Call provider office for new or worsened signs and symptoms  Blood glucose findings outside established parameters, Blood pressure findings outside established parameters, Shortness of breath, and New or worsened symptom related to to HLD, other chronic conditions, and possible sleep apnea  Call care management team with questions or concerns  Verbalize basic understanding of patient centered plan of care established today  Interventions related to HTN, HLD, and DMII:   Evaluation of current treatment plans and patient's adherence to plan as established by provider  Assessed patient understanding of disease states.  Has good understanding of chronic conditions and how to manage effectively.   Assessed patient's education and care coordination needs.  The patient has had both Covid vaccines. She did not have her card with her so she could not tell the exact dates, but had the Pfzier. Will bring her card in on next appointment on 01-04-2020 with Dr. Laural Benes.   Provided disease specific education to patient.  The patient has been told she needs to have a sleep apnea test. The patient states she has been told she snores and she takes naps a lot during the day. Will discuss this with Dr. Laural Benes at her next appointment also.   Collaborated with appropriate clinical care team members regarding patient needs. Currently working with the pharmacist and LCSW  Patient Self Care Activities related to HTN, HLD, and DMII:   Patient is unable to independently self-manage chronic health conditions  Initial goal documentation        Patient verbalizes understanding of instructions provided today.   Telephone follow up appointment with care management team member scheduled for:02-19-2020 at 3:15 pm  Alto Denver RN, MSN, CCM Community Care Coordinator [place]   [facility] Mobile: [phone]

## 2019-12-18 NOTE — Chronic Care Management (AMB) (Signed)
Care Management   Follow Up Note   12/18/2019 Name: Lisa Galvan MRN: [id number] DOB: 12-Aug-1960  Referred by: Dorcas Carrow, DO Reason for referral : Care Coordination (RNCM follow up call for Chroniic Disease Management and Care Coordination Needs)   Lisa Galvan is a 59 y.o. year old female who is a primary care patient of Dorcas Carrow, DO. The care management team was consulted for assistance with care management and care coordination needs.    Review of patient status, including review of consultants reports, relevant laboratory and other test results, and collaboration with appropriate care team members and the patient's provider was performed as part of comprehensive patient evaluation and provision of chronic care management services.    SDOH (Social Determinants of Health) assessments performed: Yes See Care Plan activities for detailed interventions related to [facility])     Advanced Directives: See Care Plan and Vynca application for related entries.   Goals Addressed              This Visit's Progress   .  COMPLETED: RNCM: Pt-"I do not have any one to help me bathe" (pt-stated)        CARE PLAN ENTRY (see longtitudinal plan of care for additional care plan information)  Current Barriers: This goal has been completed. No further issues related to hernia and ankle fracture . Knowledge Deficits related to how to obtain help in the home post fall with bimalleolar fracture to the left ankle needing surgical repair- surgery completed and the patient is recovering.  . Knowledge Deficits related to facial edema and possible cause due to medications as evidence of reactions in the past from medications- completed . Use of walker in the home due to left ankle fracture- ankle fracture repaired and the patient is working with PT in her home, is still non-weight bearing . Stress and depression related to current circumstances with fx or ankle in need of surgical repair- working  with LCSW . Lacks caregiver support.  . Corporate treasurer.  . Transportation barriers  Nurse Case Manager Clinical Goal(s):  Marland Kitchen Over the next 30 days, patient will verbalize understanding of plan for surgical repair of left ankle fracture and needed resources in the home as she recovers from injury/surgery- completed surgery completed and the patient is recovering at home . Over the next 120 days, patient will work with pcp, RNCM and CCM team to address needs related to in home help, community resources, home health and other needs the patient has until she can return to baseline health status . Over the next 120 days, patient will demonstrate a decrease in fall  exacerbations as evidenced by no new falls with injury . Over the next 60 days, patient will attend all scheduled medical appointments: Sees orthopedic provider today for plan of care related to fractured ankle- completed . Over the next 120 days, patient will demonstrate improved adherence to prescribed treatment plan for ankle fracture  as evidenced bysurgical repair of ankle fracture- completed now in the recovery phase and recovery to baseline status . Over the next 60 days, patient will demonstrate improved health management independence as evidenced byno new issues related to facial edema- completed . Over the next 60 days, patient will work with CM team pharmacist to determine possible medications that can cause facial swelling. The patient recently had an increase in her Lyrica dosage . Over the next 60 days, patient will work with CM clinical social worker to assist with the acute onset of  depression and anxiety related to injury and the need for surgical intervention.  The patient does not have help in the home and needs assistance . Over the next 30 days, patient will work with care guides  to assist with resources in the community to help the patient meet her needs- completed  Interventions:  . Evaluation of current treatment  plan related to ankle fracture and facial edema and patient's adherence to plan as established by provider. 10-16-2019: The patient has recovered from her ankle surgery. Just had surgery for hernia repair 10 days ago and is recovering well. States she is a little sore but other than that she is doing well.  . Advised patient to let the RNCM know of surgical plans and any new needs that may arise.  The patient has had surgery and is recovering at home. Her daughter and granddaughter are here from [place] helping the patient. 10-16-2019: the patient states her daughter has been here for 3 months and just left this week to go back home. She went and got groceries and made sure she has everything she needs. The patient will reach out to the [facility] for any needs she has like transportation.  . Provided education to patient re: RNCM team involvement to help her meet her health and wellness needs . Reviewed medications with patient and discussed the Lyrica.  The patient  has been taking this medication x 2  months. Did not seem to be having an issue until dosage was increased. Will consult with CCM pharmacist for help. Ongoing support  . Collaborated with CCM team and pcp  regarding patients immediate needs and concerns- the patient got all of her refills except the EPI pen. She ask if the [facility] could ask Dr. Laural Benes about an EPI pen for her. Will send an in basket message to Dr. Laural Benes requiring about EPI pen.  . Discussed plans with patient for ongoing care management follow up and provided patient with direct contact information for care management team . Reviewed scheduled/upcoming provider appointments including: Orthopedic appointment today at 1130. Future pcp appointment for discussion of bone density test and other needs. Has orthopedic appointment on 11-04-2019.  Follows up with the surgeon also.  . Evaluation of current needs.  The patient is doing well now. She has had the wound vac removed and no more  incidence of bleeding has occurred. The patient states that she is working with PT and they are changing her bandages when they come. She has a post follow up on 4/26 with orthopedic provider to take the remainder of her stables out. She can not bare weight on her foot for at least 4 more weeks. The patient's daughter and granddaughter are currently here from [place] helping her. She is thankful for the help she is getting from them. Denies any concerns at this time. 10-16-2019: The patient feels she is doing well and feels confident about staying at home by herself. The patient denies any acute distress. Will continue to monitor.  . Care Guide referral for resources in the community to help while the patient is unable to independently care for self . Social Work referral for acute onset of depression and anxiety related to fall with injury and the need for surgical intervention . Pharmacy referral for medication review to see if medication the patient is currently taking could cause facial edema. The patient ask about an EPI pen. Will collaborate with pcp and pharmacist for assistance with RX.  Marland Kitchen Evaluation of patients  statement of feeling she is allergic to latex. The patient has noticed with the bandages she has had that her skin had a rash. She removed the tape and took benadryl. This relieved the itching symptoms. Feels she has a new allergy to Latex. Will let the pcp know.   Patient Self Care Activities:  . Patient verbalizes understanding of plan to have CCM team work with the patient to meet needs during acute onset of fall with injury and the need for surgical intervention to left ankle . Attends all scheduled provider appointments . Performs ADL's independently . Performs IADL's independently . Calls provider office for new concerns or questions . Unable to independently care for self post fall with injury resulting in ankle fracture needing surgical intervention . Lacks social  connections . Unable to perform ADLs independently . Unable to perform IADLs independently  Please see past updates related to this goal by clicking on the "Past Updates" button in the selected goal      .  RNCM: Pt-"I haven't had anymore low blood sugars recently" (pt-stated)        CARE PLAN ENTRY (see longtitudinal plan of care for additional care plan information)  Current Barriers:  . Chronic Disease Management support, education, and care coordination needs related to HTN, HLD, and DMII  Clinical Goal(s) related to HTN, HLD, and DMII:  Over the next 120 days, patient will:  . Work with the care management team to address educational, disease management, and care coordination needs  . Begin or continue self health monitoring activities as directed today Measure and record cbg (blood glucose) 1 times daily, Measure and record blood pressure 2/3 times per week, and adhere to a heart healthy/ADA diet . Call provider office for new or worsened signs and symptoms Blood glucose findings outside established parameters, Blood pressure findings outside established parameters, Shortness of breath, and New or worsened symptom related to to HLD, other chronic conditions, and possible sleep apnea . Call care management team with questions or concerns . Verbalize basic understanding of patient centered plan of care established today  Interventions related to HTN, HLD, and DMII:  . Evaluation of current treatment plans and patient's adherence to plan as established by provider . Assessed patient understanding of disease states.  Has good understanding of chronic conditions and how to manage effectively.  . Assessed patient's education and care coordination needs.  The patient has had both Covid vaccines. She did not have her card with her so she could not tell the exact dates, but had the Pfzier. Will bring her card in on next appointment on 01-04-2020 with Dr. Laural Benes.  . Provided disease specific  education to patient.  The patient has been told she needs to have a sleep apnea test. The patient states she has been told she snores and she takes naps a lot during the day. Will discuss this with Dr. Laural Benes at her next appointment also.  Steele Sizer with appropriate clinical care team members regarding patient needs. Currently working with the pharmacist and LCSW  Patient Self Care Activities related to HTN, HLD, and DMII:  . Patient is unable to independently self-manage chronic health conditions  Initial goal documentation         Telephone follow up appointment with care management team member scheduled for: 02-19-2020 at 3:15 pm  Alto Denver RN, MSN, CCM Community Care Coordinator [place]  [facility] Mobile: [phone]

## 2019-12-23 ENCOUNTER — Ambulatory Visit: Payer: Self-pay | Admitting: Licensed Clinical Social Worker

## 2019-12-23 NOTE — Chronic Care Management (AMB) (Signed)
Care Management   Follow Up Note   12/23/2019 Name: Lisa Galvan MRN: [id number] DOB: 27-Oct-1960  Referred by: Dorcas Carrow, DO Reason for referral : Care Coordination   Lisa Galvan is a 59 y.o. year old female who is a primary care patient of Dorcas Carrow, DO. The care management team was consulted for assistance with care management and care coordination needs.    Review of patient status, including review of consultants reports, relevant laboratory and other test results, and collaboration with appropriate care team members and the patient's provider was performed as part of comprehensive patient evaluation and provision of chronic care management services.    SDOH (Social Determinants of Health) assessments performed: Yes See Care Plan activities for detailed interventions related to [facility])     Advanced Directives: See Care Plan and Vynca application for related entries.   Goals Addressed      SW: "I need more support and resource information" (pt-stated)        CARE PLAN ENTRY (see longitudinal plan of care for additional care plan information)  Current Barriers:   Limited social support  Level of care concerns  ADL IADL limitations  Social Isolation  Limited access to caregiver  Clinical Social Work Clinical Goal(s):   Over the next 120 days, patient will work with SW to address concerns related to gaining additional support/resource connection in order to maintain health  Interventions:  Inter-disciplinary care team collaboration (see longitudinal plan of care)  Patient interviewed and appropriate assessments performed  Provided patient with information about available personal care service resources within [facility] ([facility], [employer], [facility] and [facility]). Patient reports that she is not eligible for Medicaid and therefore would be unable to get PCS. Patient states that she may have to pay out of pocket for personal care.  Discussed plans  with patient for ongoing care management follow up and provided patient with direct contact information for care management team  Advised patient to contact CCM LCSW if future social work needs arise.  Assisted patient/caregiver with obtaining information about health plan benefits  Provided education and assistance to client regarding Advanced Directives.  Patient reports ongoing cramping and tingling with her toes and fingers. She reports that her toes will sometimes turn purple. She is going to discuss this issue with PCP during office visit this month.  Patient reports that her daughter stayed with her for 3 months but relocated back to [place]. Patient reports doing well with this adjustment. Family is hopeful that daughter can relocate back to [place] soon in order to provide caregiver to patient. Patient shares that her neighbors are moving and she is interested in her daughter buying this residence.   Patient reports that she has been receiving ongoing socialization through church. However, she reports that last Sunday she attended a social event after church and became too hot and had to leave. LCSW provided education on appropriate and safe health care which includes learning her limitations. Patient admits that she did not eat enough before she left on Sunday which could be why she felt so weak.   Stress and depression related to current circumstances with ankle repair. Patient reports that she is having difficulty accepting her current health state.  Patient reports that she is using her car for transportation instead of her jeep because she is unable to get in and out of it due to its' height.   Patient Self Care Activities:   Calls provider office for new concerns  or questions  Lacks social connections  Please see past updates related to this goal by clicking on the "Past Updates" button in the selected goal       The care management team will reach out to the patient again over  the next quarter.  Dickie La, BSW, MSW, LCSW [facility] Care Management    [facility] [email or web address] Phone: [phone]

## 2019-12-31 ENCOUNTER — Encounter: Payer: Self-pay | Admitting: Surgery

## 2020-01-04 ENCOUNTER — Encounter: Payer: BLUE CROSS/BLUE SHIELD | Admitting: Family Medicine

## 2020-01-14 DIAGNOSIS — R0683 Snoring: Secondary | ICD-10-CM | POA: Diagnosis not present

## 2020-01-14 DIAGNOSIS — G63 Polyneuropathy in diseases classified elsewhere: Secondary | ICD-10-CM | POA: Diagnosis not present

## 2020-01-14 DIAGNOSIS — E538 Deficiency of other specified B group vitamins: Secondary | ICD-10-CM | POA: Diagnosis not present

## 2020-01-14 DIAGNOSIS — G20 Parkinson's disease: Secondary | ICD-10-CM | POA: Diagnosis not present

## 2020-01-15 ENCOUNTER — Other Ambulatory Visit: Payer: Self-pay | Admitting: Family Medicine

## 2020-01-20 ENCOUNTER — Other Ambulatory Visit: Payer: Self-pay

## 2020-01-20 ENCOUNTER — Encounter: Payer: Self-pay | Admitting: Surgery

## 2020-01-20 ENCOUNTER — Ambulatory Visit (INDEPENDENT_AMBULATORY_CARE_PROVIDER_SITE_OTHER): Payer: BLUE CROSS/BLUE SHIELD | Admitting: Surgery

## 2020-01-20 DIAGNOSIS — R1032 Left lower quadrant pain: Secondary | ICD-10-CM

## 2020-01-20 MED ORDER — SUCRALFATE 1 G PO TABS: 1.0000 g | ORAL_TABLET | Freq: Three times a day (TID) | ORAL | 2 refills | Status: DC

## 2020-01-20 NOTE — Patient Instructions (Signed)
Your CT abdomen and pelvis without contrast is scheduled for 02/02/20 at 4pm at the [facility]. Nothing to eat or drink 4 hours prior. Pick up prep kit prior to CT appointment.   Pick up your prescription at your local pharmacy. See your appointment below.

## 2020-01-22 NOTE — Progress Notes (Signed)
Outpatient Surgical Follow Up  01/22/2020  Lisa Galvan is an 59 y.o. female.   Chief Complaint  Patient presents with  . Follow-up    acid reflux     HPI: Lisa Galvan is a 59 year old female with significant reflux requiring PPI Carafate.    Three a ventral hernia repair by me 4 months ago.  She also had significant reflux symptoms that currently are well managed with medication.  Now she feels as on the left upper quadrant next to her port site incision.  No fevers no chills.  No nausea no vomiting.  Is otherwise doing much better.  No current imaging studies.  Past Medical History:  Diagnosis Date  . Allergy   . Arthritis    spine  . Bilateral leg edema   . Chest pain 12/15/2014  . Chronic sciatica 12/10/2014  . Essential hypertension 12/10/2014  . Gastroesophageal reflux disease without esophagitis   . Mixed hyperlipidemia   . Obesity (BMI 30.0-34.9) 12/10/2014  . Type 2 diabetes mellitus without complication (HCC) 12/10/2014  . Umbilical hernia     Past Surgical History:  Procedure Laterality Date  . ABDOMINAL HYSTERECTOMY    . COLONOSCOPY WITH PROPOFOL N/A 03/30/2019   Procedure: COLONOSCOPY WITH PROPOFOL;  Surgeon: Toney Reil, MD;  Location: [facility] ENDOSCOPY;  Service: Gastroenterology;  Laterality: N/A;  . ESOPHAGOGASTRODUODENOSCOPY (EGD) WITH PROPOFOL N/A 03/12/2017   Procedure: ESOPHAGOGASTRODUODENOSCOPY (EGD) WITH PROPOFOL;  Surgeon: Toney Reil, MD;  Location: [facility] ENDOSCOPY;  Service: Gastroenterology;  Laterality: N/A;  . ESOPHAGOGASTRODUODENOSCOPY (EGD) WITH PROPOFOL N/A 03/30/2019   Procedure: ESOPHAGOGASTRODUODENOSCOPY (EGD) WITH PROPOFOL;  Surgeon: Toney Reil, MD;  Location: [facility] ENDOSCOPY;  Service: Gastroenterology;  Laterality: N/A;  . HERNIA REPAIR    . ORIF ANKLE FRACTURE Left 07/30/2019   Procedure: OPEN REDUCTION INTERNAL FIXATION (ORIF) OF LEFT BIMALLEOLAR ANKLE FRACTURE;  Surgeon: Signa Kell, MD;  Location: [facility];   Service: Orthopedics;  Laterality: Left;  Diabetic - oral meds  . UMBILICAL HERNIA REPAIR N/A 05/01/2017   Procedure: HERNIA REPAIR UMBILICAL ADULT;  Surgeon: Ancil Linsey, MD;  Location: [facility] ORS;  Service: General;  Laterality: N/A;  . XI ROBOTIC ASSISTED VENTRAL HERNIA N/A 10/06/2019   Procedure: XI ROBOTIC ASSISTED VENTRAL HERNIA;  Surgeon: Leafy Ro, MD;  Location: [facility] ORS;  Service: General;  Laterality: N/A;    Family History  Problem Relation Age of Onset  . Diabetes Mother   . Cancer Mother        breast and stomach  . Breast cancer Mother 83  . Dementia Mother   . Heart disease Father   . Kidney disease Father        dialysis  . Cancer Father        colon  . Diabetes Father   . Hypertension Sister   . Hypertension Brother   . Congestive Heart Failure Maternal Grandmother   . Diabetes Maternal Grandmother   . Hypertension Maternal Grandmother     Social History:  reports that she has never smoked. She has never used smokeless tobacco. She reports current alcohol use of about 3.0 standard drinks of alcohol per week. She reports that she does not use drugs.  Allergies:  Allergies  Allergen Reactions  . Amlodipine Besy-Benazepril Hcl Anaphylaxis  . Ivp Dye [Iodinated Diagnostic Agents] Anaphylaxis    Pt is unaware   . Shellfish Allergy Swelling and Anaphylaxis  . Shellfish-Derived Products Anaphylaxis  . Ace Inhibitors Swelling    Medications reviewed.  ROS Full ROS performed and is otherwise negative other than what is stated in HPI   BP 106/74   Pulse 90   Temp 98.4 F (36.9 C)   Resp 12   Ht 5\' 3"  (1.6 m)   Wt 192 lb (87.1 kg)   SpO2 95%   BMI 34.01 kg/m   Physical Exam Vitals and nursing note reviewed. Exam conducted with a chaperone present.  Constitutional:      General: She is not in acute distress.    Appearance: Normal appearance.  Cardiovascular:     Rate and Rhythm: Normal rate and regular rhythm.  Pulmonary:     Effort:  Pulmonary effort is normal. No respiratory distress.     Breath sounds: Normal breath sounds. No stridor.  Abdominal:     General: Abdomen is flat. There is no distension.     Palpations: Abdomen is soft. There is no mass.     Tenderness: There is no abdominal tenderness. There is no rebound.     Hernia: A hernia is present.     Comments: Left Upper quadrant small incisional hernia reducible.  Mild tenderness palpation.  No peritonitis.  No infection.  Musculoskeletal:        General: No swelling or tenderness. Normal range of motion.     Cervical back: Normal range of motion and neck supple. No rigidity or tenderness.  Lymphadenopathy:     Cervical: No cervical adenopathy.  Skin:    General: Skin is warm and dry.     Capillary Refill: Capillary refill takes less than 2 seconds.  Neurological:     General: No focal deficit present.     Mental Status: She is alert and oriented to person, place, and time.  Psychiatric:        Mood and Affect: Mood normal.        Behavior: Behavior normal.        Thought Content: Thought content normal.        Judgment: Judgment normal.      Assessment/Plan: 58y-old female with presumed left port site incisional hernia.  Discussed with patient detail about importance of obtaining CT scan for preoperative planning.  I do think at some point time we will have to address this surgically but would like to obtain a CT preoperatively to establish best approach.  Currently there is no evidence of incarceration or acute abdomen.  We will call her after CT is completed   Greater than 50% of the 30 minutes  visit was spent in counseling/coordination of care   , MD [facility] General Surgeon

## 2020-01-25 ENCOUNTER — Ambulatory Visit: Payer: BLUE CROSS/BLUE SHIELD | Attending: Internal Medicine

## 2020-01-25 DIAGNOSIS — Z23 Encounter for immunization: Secondary | ICD-10-CM

## 2020-01-25 NOTE — Progress Notes (Signed)
   Covid-19 Vaccination Clinic  Name:  Mckaylin Bastien    MRN: [id number] DOB: 1960-12-14  01/25/2020  Ms. Petterson was observed post Covid-19 immunization for 15 minutes without incident. She was provided with Vaccine Information Sheet and instruction to access the V-Safe system.   Ms. Roderick was instructed to call 911 with any severe reactions post vaccine: Marland Kitchen Difficulty breathing  . Swelling of face and throat  . A fast heartbeat  . A bad rash all over body  . Dizziness and weakness

## 2020-01-31 NOTE — Progress Notes (Signed)
PROVIDER NOTE: Information contained herein reflects review and annotations entered in association with encounter. Interpretation of such information and data should be left to medically-trained personnel. Information provided to patient can be located elsewhere in the medical record under "Patient Instructions". Document created using STT-dictation technology, any transcriptional errors that may result from process are unintentional.    Patient: Lisa Galvan  Service Category: E/M  Provider: Gaspar Cola, MD  DOB: [birth year]  DOS: 02/01/2020  Specialty: Interventional Pain Management  MRN: [id number]  Setting: Ambulatory outpatient  PCP: [name], DO  Type: Established Patient    Referring Provider: [name], DO  Location: Office  Delivery: Face-to-face     HPI  [name], a 59 y.o. year old female, is here today because of her Chronic pain syndrome [G89.4]. [name]'s primary complain today is Foot Burn Last encounter: My last encounter with her was on Visit date not found. Pertinent problems: [name] has DDD (degenerative disc disease), lumbar; Lumbar facet syndrome (Bilateral); Sacroiliac joint dysfunction; Neuropathy due to secondary diabetes ([place]); Bilateral leg edema; Mastalgia in female; Chronic pain syndrome; Chronic low back pain (Third area of Pain) (Bilateral) w/o sciatica; Chronic lower extremity pain (Secondary area of Pain) (Bilateral); Abnormal MRI, lumbar spine (02/08/2019); Grade 1 Anterolisthesis of L4/5; Chronic hip pain (Bilateral) (R>L); Chronic sacroiliac joint pain (Bilateral); Sacroiliac joint somatic dysfunction (Bilateral); Chronic feet pain (Primary Area of Pain) (Bilateral); Chronic leg and foot pain (Bilateral); Diabetic peripheral neuropathy ([place]); Chronic neuropathic pain; Neurogenic pain; Chronic musculoskeletal pain; Osteoarthritis involving multiple joints; Spondylosis without myelopathy or radiculopathy, lumbosacral region; Lumbar Facet  Hypertrophy; Abnormal MRI, cervical spine (02/08/2019); Lower extremity weakness (Bilateral); Coordination impairment (lower extremity); Other spondylosis, sacral and sacrococcygeal region; Acute postoperative pain; Facial swelling; and Coccygodynia on their pertinent problem list. Pain Assessment: Severity of Chronic pain is reported as a 5 /10. Location: Toe (Comment which one) Right, Left/ .Quality: Aching, Tingling. Modifying factor(s): meds. Vitals:  height is '5\' 3"'  (1.6 m) and weight is 192 lb (87.1 kg). Her temperature is 97.1 F (36.2 C) (abnormal). Her blood pressure is 133/91 (abnormal) and her pulse is 88. Her oxygen saturation is 98%.   Reason for encounter: medication management.  The patient indicates doing well with the current medication regimen. No adverse reactions or side effects reported to the medications.  The patient refers taking the Lyrica 50 mg twice daily instead of 3 times daily.  However, when she comes in today she indicates that she feels she could be doing better if she was taking it 3 times a day.  I think he might have not been aware that he could take it 3 times a day.  In any case, he indicates that when she takes the pill at 9 AM it will provide her with good relief of the pain until approximately 7 PM.  He then starts having the burning sensation in the shooting pains and because she waits until 9 PM to take the second pill, she indicates that it takes her a while to control the pain again.  I recommended for her to take the Lyrica 50 mg p.o. 3 times daily as I had prescribed but today I have also instructed her that at bedtime she can take 1 to 2 tablets, instead of just 1 tablet, should she feel that it is not enough and she could use more medicine.  However, if she feels that it makes her too sleepy in the morning, she can stay  with the 1 pill.  At this point she seems to be stable on this medication with no significant side effects and therefore I will be transferring  them to her PCP.  In addition, she also indicates taking the magnesium daily instead of twice daily.  Today we will transfer this to medications to her PCP.    The patient indicates that the interventional therapies that we provided for her have helped her considerably with the pain and the only area where she has some pain that remains is in the area of the tailbone.  She would like to know if there is anything that we can do about that.  After reviewing her case, I have offered her a caudal epidural steroid injection to see if we can eliminate this pain.   Pharmacotherapy Assessment   Analgesic: No opioid analgesics prescribed by our practice.   Monitoring: [place] PMP: PDMP reviewed during this encounter.       Pharmacotherapy: No side-effects or adverse reactions reported. Compliance: No problems identified. Effectiveness: Clinically acceptable.  Chauncey Fischer, RN  02/01/2020  8:35 AM  Sign when Signing Visit Safety precautions to be maintained throughout the outpatient stay will include: orient to surroundings, keep bed in low position, maintain call bell within reach at all times, provide assistance with transfer out of bed and ambulation.     UDS:  Summary  Date Value Ref Range Status  03/11/2019 Note  Final    Comment:    ==================================================================== Compliance Drug Analysis, Ur ==================================================================== Test                             Result       Flag       Units Drug Present   Gabapentin                     PRESENT   Duloxetine                     PRESENT   Ibuprofen                      PRESENT ==================================================================== Test                      Result    Flag   Units      Ref Range   Creatinine              90               mg/dL      >=20 ==================================================================== Declared Medications:  Medication list  was not provided. ==================================================================== For clinical consultation, please call [phone]. ====================================================================      ROS  Constitutional: Denies any fever or chills Gastrointestinal: No reported hemesis, hematochezia, vomiting, or acute GI distress Musculoskeletal: Denies any acute onset joint swelling, redness, loss of ROM, or weakness Neurological: No reported episodes of acute onset apraxia, aphasia, dysarthria, agnosia, amnesia, paralysis, loss of coordination, or loss of consciousness  Medication Review  Cyanocobalamin, DULoxetine, Magnesium Oxide, Vitamin D-3, acetaminophen, amLODipine, atorvastatin, empagliflozin, fexofenadine, hydrochlorothiazide, ketoconazole, metFORMIN, montelukast, omeprazole, pregabalin, rOPINIRole, sucralfate, and valACYclovir  History Review  Allergy: Ms. Budnick is allergic to amlodipine besy-benazepril hcl, ivp dye [iodinated diagnostic agents], shellfish allergy, shellfish-derived products, and ace inhibitors. Drug: Ms. Fredlund  reports no history of drug use. Alcohol:  reports current alcohol use of about 3.0  standard drinks of alcohol per week. Tobacco:  reports that she has never smoked. She has never used smokeless tobacco. Social: Ms. Koppen  reports that she has never smoked. She has never used smokeless tobacco. She reports current alcohol use of about 3.0 standard drinks of alcohol per week. She reports that she does not use drugs. Medical:  has a past medical history of Allergy, Arthritis, Bilateral leg edema, Chest pain (12/15/2014), Chronic sciatica (12/10/2014), Essential hypertension (12/10/2014), Gastroesophageal reflux disease without esophagitis, Mixed hyperlipidemia, Obesity (BMI 30.0-34.9) (12/10/2014), Type 2 diabetes mellitus without complication ([place]) (07/20/760), and Umbilical hernia. Surgical: Ms. Labella  has a past surgical history that includes  Abdominal hysterectomy; Esophagogastroduodenoscopy (egd) with propofol (N/A, 03/12/2017); Umbilical hernia repair (N/A, 05/01/2017); Hernia repair; Esophagogastroduodenoscopy (egd) with propofol (N/A, 03/30/2019); Colonoscopy with propofol (N/A, 03/30/2019); ORIF ankle fracture (Left, 07/30/2019); and XI robotic assisted ventral hernia (N/A, 10/06/2019). Family: family history includes Breast cancer (age of onset: 35) in her mother; Cancer in her father and mother; Congestive Heart Failure in her maternal grandmother; Dementia in her mother; Diabetes in her father, maternal grandmother, and mother; Heart disease in her father; Hypertension in her brother, maternal grandmother, and sister; Kidney disease in her father.  Laboratory Chemistry Profile   Renal Lab Results  Component Value Date   BUN 7 (L) 09/28/2019   CREATININE 0.77 09/28/2019   BCR 9 (L) 09/28/2019   GFRAA 97 09/28/2019   GFRNONAA 84 09/28/2019     Hepatic Lab Results  Component Value Date   AST 15 09/28/2019   ALT 11 09/28/2019   ALBUMIN 4.8 09/28/2019   ALKPHOS 69 09/28/2019     Electrolytes Lab Results  Component Value Date   NA 143 09/28/2019   K 3.6 09/28/2019   CL 96 09/28/2019   CALCIUM 10.0 09/28/2019   MG 1.5 (L) 03/11/2019   PHOS 4.7 (H) 07/03/2017     Bone Lab Results  Component Value Date   VD25OH 50.0 04/13/2019     Inflammation (CRP: Acute Phase) (ESR: Chronic Phase) Lab Results  Component Value Date   CRP <1 02/10/2019   ESRSEDRATE 27 03/11/2019       Note: Above Lab results reviewed.  Recent Imaging Review  DG ESOPHAGUS W DOUBLE CM (HD) CLINICAL DATA:  Dysphagia.  Food getting stuck in throat.  EXAM: ESOPHOGRAM / BARIUM SWALLOW / BARIUM TABLET STUDY  TECHNIQUE: Combined double contrast and single contrast examination performed using effervescent crystals, thick barium liquid, and thin barium liquid. The patient was observed with fluoroscopy swallowing a 13 mm barium sulphate  tablet.  FLUOROSCOPY TIME:  Fluoroscopy Time:  1 minutes and 18 seconds.  Radiation Exposure Index (if provided by the fluoroscopic device): 145 mGy  Number of Acquired Spot Images:  COMPARISON:  None.  FINDINGS: Frontal and lateral views of the hypopharynx while swallowing thick barium are unremarkable. Specifically, no proximal esophageal stricture or Zenker's diverticulum.  Double contrast imaging of the esophagus shows no evidence for mass lesion, stricture, or gross mucosal ulceration. No diverticulum.  Assessment of esophageal motility reveals disruption of primary peristalsis in the middle third of the distal esophagus. No tertiary contractions or presbyesophagus associated.  13 mm barium tablet passes readily into the stomach when taken with water.  IMPRESSION: Nonspecific esophageal motility disorder.  Otherwise unremarkable.  Electronically Signed   By: Misty Stanley M.D.   On: 11/03/2019 09:46 Note: Reviewed        Physical Exam  General appearance: Well nourished, well developed, and  well hydrated. In no apparent acute distress Mental status: Alert, oriented x 3 (person, place, & time)       Respiratory: No evidence of acute respiratory distress Eyes: PERLA Vitals: BP (!) 133/91   Pulse 88   Temp (!) 97.1 F (36.2 C)   Ht '5\' 3"'  (1.6 m)   Wt 192 lb (87.1 kg)   SpO2 98%   BMI 34.01 kg/m  BMI: Estimated body mass index is 34.01 kg/m as calculated from the following:   Height as of this encounter: '5\' 3"'  (1.6 m).   Weight as of this encounter: 192 lb (87.1 kg). Ideal: Ideal body weight: 52.4 kg (115 lb 8.3 oz) Adjusted ideal body weight: 66.3 kg (146 lb 1.8 oz)  Assessment   Status Diagnosis  Controlled Controlled Controlled 1. Chronic pain syndrome   2. Chronic feet pain (Primary Area of Pain) (Bilateral)   3. Chronic lower extremity pain (Secondary area of Pain) (Bilateral)   4. Chronic low back pain (Third area of Pain) (Bilateral) w/o sciatica    5. Diabetic peripheral neuropathy (H[facility])   6. Lumbar facet syndrome (Bilateral)   7. Chronic neuropathic pain   8. Neurogenic pain   9. Hypomagnesemia   10. Coccygodynia      Updated Problems: Problem  Coccygodynia    Plan of Care  Problem-specific:  No problem-specific Assessment & Plan notes found for this encounter.  Ms. Rilla Buckman has a current medication list which includes the following long-term medication(s): amlodipine, atorvastatin, duloxetine, fexofenadine, hydrochlorothiazide, metformin, montelukast, omeprazole, ropinirole, sucralfate, magnesium oxide, and pregabalin.  Pharmacotherapy (Medications Ordered): Meds ordered this encounter  Medications  . pregabalin (LYRICA) 50 MG capsule    Sig: Take 1 capsule (50 mg total) by mouth 2 (two) times daily AND 1-2 capsules (50-100 mg total) at bedtime.    Dispense:  120 capsule    Refill:  2    Fill one day early if pharmacy is closed on scheduled refill date. May substitute for generic if available.  . Magnesium Oxide 500 MG CAPS    Sig: Take 1 capsule (500 mg total) by mouth in the morning.    Dispense:  30 capsule    Refill:  2    Fill one day early if pharmacy is closed on scheduled refill date. May substitute for generic if available.   Orders:  Orders Placed This Encounter  Procedures  . Caudal Epidural Injection    Standing Status:   Future    Standing Expiration Date:   03/03/2020    Scheduling Instructions:     Laterality: Midline     Level(s): Sacrococcygeal canal (Tailbone area)     Sedation: Patient's choice     Scheduling Timeframe: As soon as pre-approved    Order Specific Question:   Where will this procedure be performed?    Answer:   [facility]   Follow-up plan:   Return for Procedure (no sedation): (ML) Caudal ESI #1.      Interventional management options: Planned, scheduled, and/or pending:       Considering:   Diagnostic caudal ESI #1  Diagnostic/therapeutic right L4-5 LESI  x1 (done by Dr. Primus Bravo on 12/29/2014)   PRN Procedures:   Palliative right lumbar facet RFA #2 (last done 06/02/2019)  Palliative left lumbar facet RFA #2 (last done 06/30/2019)  Palliative bilateral lumbar facet block #3  Palliative right sacroiliac joint block #2     Recent Visits No visits were found meeting these conditions. Showing recent  visits within past 90 days and meeting all other requirements Today's Visits Date Type Provider Dept  02/01/20 Office Visit Milinda Pointer, MD [facility]  Showing today's visits and meeting all other requirements Future Appointments No visits were found meeting these conditions. Showing future appointments within next 90 days and meeting all other requirements  I discussed the assessment and treatment plan with the patient. The patient was provided an opportunity to ask questions and all were answered. The patient agreed with the plan and demonstrated an understanding of the instructions.  Patient advised to call back or seek an in-person evaluation if the symptoms or condition worsens.  Duration of encounter: 30 minutes.  Note by: Gaspar Cola, MD Date: 02/01/2020; Time: 9:27 AM

## 2020-02-01 ENCOUNTER — Other Ambulatory Visit: Payer: Self-pay

## 2020-02-01 ENCOUNTER — Ambulatory Visit: Payer: BLUE CROSS/BLUE SHIELD | Attending: Pain Medicine | Admitting: Pain Medicine

## 2020-02-01 ENCOUNTER — Encounter: Payer: Self-pay | Admitting: Pain Medicine

## 2020-02-01 VITALS — BP 133/91 | HR 88 | Temp 97.1°F | Ht 63.0 in | Wt 192.0 lb

## 2020-02-01 DIAGNOSIS — M545 Low back pain, unspecified: Secondary | ICD-10-CM

## 2020-02-01 DIAGNOSIS — M79671 Pain in right foot: Secondary | ICD-10-CM | POA: Diagnosis not present

## 2020-02-01 DIAGNOSIS — M79605 Pain in left leg: Secondary | ICD-10-CM | POA: Diagnosis not present

## 2020-02-01 DIAGNOSIS — M792 Neuralgia and neuritis, unspecified: Secondary | ICD-10-CM

## 2020-02-01 DIAGNOSIS — M47816 Spondylosis without myelopathy or radiculopathy, lumbar region: Secondary | ICD-10-CM | POA: Diagnosis not present

## 2020-02-01 DIAGNOSIS — M79672 Pain in left foot: Secondary | ICD-10-CM | POA: Insufficient documentation

## 2020-02-01 DIAGNOSIS — G894 Chronic pain syndrome: Secondary | ICD-10-CM

## 2020-02-01 DIAGNOSIS — E1142 Type 2 diabetes mellitus with diabetic polyneuropathy: Secondary | ICD-10-CM | POA: Diagnosis not present

## 2020-02-01 DIAGNOSIS — M533 Sacrococcygeal disorders, not elsewhere classified: Secondary | ICD-10-CM

## 2020-02-01 DIAGNOSIS — G8929 Other chronic pain: Secondary | ICD-10-CM | POA: Diagnosis not present

## 2020-02-01 DIAGNOSIS — M79604 Pain in right leg: Secondary | ICD-10-CM | POA: Diagnosis not present

## 2020-02-01 MED ORDER — MAGNESIUM OXIDE -MG SUPPLEMENT 500 MG PO CAPS: 1.0000 | ORAL_CAPSULE | Freq: Every morning | ORAL | 2 refills | Status: DC

## 2020-02-01 MED ORDER — PREGABALIN 50 MG PO CAPS: ORAL_CAPSULE | ORAL | 2 refills | Status: DC

## 2020-02-01 NOTE — Patient Instructions (Addendum)
____________________________________________________________________________________________  Preparing for your procedure (without sedation)  Procedure appointments are limited to planned procedures: . No Prescription Refills. . No disability issues will be discussed. . No medication changes will be discussed.  Instructions: . Oral Intake: Do not eat or drink anything for at least 6 hours prior to your procedure. (Exception: Blood Pressure Medication. See below.) . Transportation: Unless otherwise stated by your physician, you may drive yourself after the procedure. . Blood Pressure Medicine: Do not forget to take your blood pressure medicine with a sip of water the morning of the procedure. If your Diastolic (lower reading)is above 100 mmHg, elective cases will be cancelled/rescheduled. . Blood thinners: These will need to be stopped for procedures. Notify our staff if you are taking any blood thinners. Depending on which one you take, there will be specific instructions on how and when to stop it. . Diabetics on insulin: Notify the staff so that you can be scheduled 1st case in the morning. If your diabetes requires high dose insulin, take only  of your normal insulin dose the morning of the procedure and notify the staff that you have done so. . Preventing infections: Shower with an antibacterial soap the morning of your procedure.  . Build-up your immune system: Take 1000 mg of Vitamin C with every meal (3 times a day) the day prior to your procedure. . Antibiotics: Inform the staff if you have a condition or reason that requires you to take antibiotics before dental procedures. . Pregnancy: If you are pregnant, call and cancel the procedure. . Sickness: If you have a cold, fever, or any active infections, call and cancel the procedure. . Arrival: You must be in the facility at least 30 minutes prior to your scheduled procedure. . Children: Do not bring any children with you. . Dress  appropriately: Bring dark clothing that you would not mind if they get stained. . Valuables: Do not bring any jewelry or valuables.  Reasons to call and reschedule or cancel your procedure: (Following these recommendations will minimize the risk of a serious complication.) . Surgeries: Avoid having procedures within 2 weeks of any surgery. (Avoid for 2 weeks before or after any surgery). . Flu Shots: Avoid having procedures within 2 weeks of a flu shots or . (Avoid for 2 weeks before or after immunizations). . Barium: Avoid having a procedure within 7-10 days after having had a radiological study involving the use of radiological contrast. (Myelograms, Barium swallow or enema study). . Heart attacks: Avoid any elective procedures or surgeries for the initial 6 months after a "Myocardial Infarction" (Heart Attack). . Blood thinners: It is imperative that you stop these medications before procedures. Let us know if you if you take any blood thinner.  . Infection: Avoid procedures during or within two weeks of an infection (including chest colds or gastrointestinal problems). Symptoms associated with infections include: Localized redness, fever, chills, night sweats or profuse sweating, burning sensation when voiding, cough, congestion, stuffiness, runny nose, sore throat, diarrhea, nausea, vomiting, cold or Flu symptoms, recent or current infections. It is specially important if the infection is over the area that we intend to treat. . Heart and lung problems: Symptoms that may suggest an active cardiopulmonary problem include: cough, chest pain, breathing difficulties or shortness of breath, dizziness, ankle swelling, uncontrolled high or unusually low blood pressure, and/or palpitations. If you are experiencing any of these symptoms, cancel your procedure and contact your primary care physician for an evaluation.  Remember:  Regular   Business hours are:  Monday to Thursday 8:00 AM to 4:00  PM  Provider's Schedule: Dalanie Kisner, MD:  Procedure days: Tuesday and Thursday 7:30 AM to 4:00 PM  Bilal Lateef, MD:  Procedure days: Monday and Wednesday 7:30 AM to 4:00 PM ____________________________________________________________________________________________    Epidural Steroid Injection Patient Information  Description: The epidural space surrounds the nerves as they exit the spinal cord.  In some patients, the nerves can be compressed and inflamed by a bulging disc or a tight spinal canal (spinal stenosis).  By injecting steroids into the epidural space, we can bring irritated nerves into direct contact with a potentially helpful medication.  These steroids act directly on the irritated nerves and can reduce swelling and inflammation which often leads to decreased pain.  Epidural steroids may be injected anywhere along the spine and from the neck to the low back depending upon the location of your pain.   After numbing the skin with local anesthetic (like Novocaine), a small needle is passed into the epidural space slowly.  You may experience a sensation of pressure while this is being done.  The entire block usually last less than 10 minutes.  Conditions which may be treated by epidural steroids:   Low back and leg pain  Neck and arm pain  Spinal stenosis  Post-laminectomy syndrome  Herpes zoster (shingles) pain  Pain from compression fractures  Preparation for the injection:  1. Do not eat any solid food or dairy products within 8 hours of your appointment.  2. You may drink clear liquids up to 3 hours before appointment.  Clear liquids include water, black coffee, juice or soda.  No milk or cream please. 3. You may take your regular medication, including pain medications, with a sip of water before your appointment  Diabetics should hold regular insulin (if taken separately) and take 1/2 normal NPH dos the morning of the procedure.  Carry some sugar containing  items with you to your appointment. 4. A driver must accompany you and be prepared to drive you home after your procedure.  5. Bring all your current medications with your. 6. An IV may be inserted and sedation may be given at the discretion of the physician.   7. A blood pressure cuff, EKG and other monitors will often be applied during the procedure.  Some patients may need to have extra oxygen administered for a short period. 8. You will be asked to provide medical information, including your allergies, prior to the procedure.  We must know immediately if you are taking blood thinners (like Coumadin/Warfarin)  Or if you are allergic to IV iodine contrast (dye). We must know if you could possible be pregnant.  Possible side-effects:  Bleeding from needle site  Infection (rare, may require surgery)  Nerve injury (rare)  Numbness & tingling (temporary)  Difficulty urinating (rare, temporary)  Spinal headache ( a headache worse with upright posture)  Light -headedness (temporary)  Pain at injection site (several days)  Decreased blood pressure (temporary)  Weakness in arm/leg (temporary)  Pressure sensation in back/neck (temporary)  Call if you experience:  Fever/chills associated with headache or increased back/neck pain.  Headache worsened by an upright position.  New onset weakness or numbness of an extremity below the injection site  Hives or difficulty breathing (go to the emergency room)  Inflammation or drainage at the infection site  Severe back/neck pain  Any new symptoms which are concerning to you  Please note:  Although the local anesthetic   injected can often make your back or neck feel good for several hours after the injection, the pain will likely return.  It takes 3-7 days for steroids to work in the epidural space.  You may not notice any pain relief for at least that one week.  If effective, we will often do a series of three injections spaced 3-6  weeks apart to maximally decrease your pain.  After the initial series, we generally will wait several months before considering a repeat injection of the same type.  If you have any questions, please call [phone] [facility] 

## 2020-02-01 NOTE — Progress Notes (Signed)
Safety precautions to be maintained throughout the outpatient stay will include: orient to surroundings, keep bed in low position, maintain call bell within reach at all times, provide assistance with transfer out of bed and ambulation.  

## 2020-02-02 ENCOUNTER — Ambulatory Visit
Admission: RE | Admit: 2020-02-02 | Discharge: 2020-02-02 | Disposition: A | Discharge status: Home / Self Care | Payer: BLUE CROSS/BLUE SHIELD | Source: Ambulatory Visit | Attending: Surgery | Admitting: Surgery

## 2020-02-02 DIAGNOSIS — R1032 Left lower quadrant pain: Secondary | ICD-10-CM | POA: Diagnosis not present

## 2020-02-02 DIAGNOSIS — K8020 Calculus of gallbladder without cholecystitis without obstruction: Secondary | ICD-10-CM | POA: Diagnosis not present

## 2020-02-02 DIAGNOSIS — D259 Leiomyoma of uterus, unspecified: Secondary | ICD-10-CM | POA: Diagnosis not present

## 2020-02-02 DIAGNOSIS — K439 Ventral hernia without obstruction or gangrene: Secondary | ICD-10-CM | POA: Diagnosis not present

## 2020-02-02 DIAGNOSIS — K469 Unspecified abdominal hernia without obstruction or gangrene: Secondary | ICD-10-CM | POA: Diagnosis not present

## 2020-02-03 ENCOUNTER — Other Ambulatory Visit: Payer: Self-pay

## 2020-02-03 ENCOUNTER — Ambulatory Visit (INDEPENDENT_AMBULATORY_CARE_PROVIDER_SITE_OTHER): Payer: BLUE CROSS/BLUE SHIELD | Admitting: Surgery

## 2020-02-03 ENCOUNTER — Telehealth: Payer: Self-pay | Admitting: Pain Medicine

## 2020-02-03 ENCOUNTER — Encounter: Payer: Self-pay | Admitting: Surgery

## 2020-02-03 VITALS — BP 114/76 | HR 90 | Temp 98.3°F | Resp 12 | Ht 63.0 in | Wt 190.0 lb

## 2020-02-03 DIAGNOSIS — K432 Incisional hernia without obstruction or gangrene: Secondary | ICD-10-CM | POA: Diagnosis not present

## 2020-02-03 NOTE — Telephone Encounter (Signed)
Lisa Galvan called back with insurance info for prescriptions  [employer] [place] [name] [id number]  [phone] customer service [phone] prescription program [phone] eligibility

## 2020-02-03 NOTE — Patient Instructions (Signed)
Our surgery scheduler will call you within 24-48 hours to schedule your surgery. Please have the Blue surgery sheet available when speaking with her.   Hernia, Adult   A hernia happens when tissue inside your body pushes out through a weak spot in your belly muscles (abdominal wall). This makes a round lump (bulge). The lump may be:  In a scar from surgery that was done in your belly (incisional hernia).  Near your belly button (umbilical hernia).  In your groin (inguinal hernia). Your groin is the area where your leg meets your lower belly (abdomen). This kind of hernia could also be: ? In your scrotum, if you are female. ? In folds of skin around your vagina, if you are female.  In your upper thigh (femoral hernia).  Inside your belly (hiatal hernia). This happens when your stomach slides above the muscle between your belly and your chest (diaphragm). If your hernia is small and it does not cause pain, you may not need treatment. If your hernia is large or it causes pain, you may need surgery. Follow these instructions at home: Activity  Avoid stretching or overusing (straining) the muscles near your hernia. Straining can happen when you: ? Lift something heavy. ? Poop (have a bowel movement).  Do not lift anything that is heavier than 10 lb (4.5 kg), or the limit that you are told, until your doctor says that it is safe.  Use the strength of your legs when you lift something heavy. Do not use only your back muscles to lift. General instructions  Do these things if told by your doctor so you do not have trouble pooping (constipation): ? Drink enough fluid to keep your pee (urine) pale yellow. ? Eat foods that are high in fiber. These include fresh fruits and vegetables, whole grains, and beans. ? Limit foods that are high in fat and processed sugars. These include foods that are fried or sweet. ? Take medicine for trouble pooping.  When you cough, try to cough gently.  You may  try to push your hernia in by very gently pressing on it when you are lying down. Do not try to force the bulge back in if it will not push in easily.  If you are overweight, work with your doctor to lose weight safely.  Do not use any products that have nicotine or tobacco in them. These include cigarettes and e-cigarettes. If you need help quitting, ask your doctor.  If you will be having surgery (hernia repair), watch your hernia for changes in shape, size, or color. Tell your doctor if you see any changes.  Take over-the-counter and prescription medicines only as told by your doctor.  Keep all follow-up visits as told by your doctor. Contact a doctor if:  You get new pain, swelling, or redness near your hernia.  You poop fewer times in a week than normal.  You have trouble pooping.  You have poop (stool) that is more dry than normal.  You have poop that is harder or larger than normal. Get help right away if:  You have a fever.  You have belly pain that gets worse.  You feel sick to your stomach (nauseous).  You throw up (vomit).  Your hernia cannot be pushed in by very gently pressing on it when you are lying down. Do not try to force the bulge back in if it will not push in easily.  Your hernia: ? Changes in shape or size. ? Changes   color. ? Feels hard or it hurts when you touch it. These symptoms may represent a serious problem that is an emergency. Do not wait to see if the symptoms will go away. Get medical help right away. Call your local emergency services (911 in the [place] Summary  A hernia happens when tissue inside your body pushes out through a weak spot in the belly muscles. This creates a bulge.  If your hernia is small and it does not hurt, you may not need treatment. If your hernia is large or it hurts, you may need surgery.  If you will be having surgery, watch your hernia for changes in shape, size, or color. Tell your doctor about any changes. This  information is not intended to replace advice given to you by your health care provider. Make sure you discuss any questions you have with your health care provider. Document Revised: 07/31/2018 Document Reviewed: 01/09/2017 Elsevier Patient Education  2020 [employer]  

## 2020-02-04 ENCOUNTER — Telehealth: Payer: Self-pay | Admitting: Surgery

## 2020-02-04 NOTE — Telephone Encounter (Signed)
PA sent by DW

## 2020-02-04 NOTE — Telephone Encounter (Signed)
Patient has been advised of Pre-Admission date/time, COVID Testing date and Surgery date.  Surgery Date: 03/24/20 Preadmission Testing Date: 03/14/20 (phone 1p-5p) Covid Testing Date: 03/22/20 - patient advised to [facility] ([address] [facility]) between 8a-1p  Patient has been made aware to call [phone], between 1-3:00pm the day before surgery, to find out what time to arrive for surgery.   The above has also been mailed to the patient.

## 2020-02-08 ENCOUNTER — Telehealth: Payer: Self-pay | Admitting: Emergency Medicine

## 2020-02-08 NOTE — Telephone Encounter (Signed)
Pt advised of CT results. Per Dr Everlene Farrier, CT only shows hernia. Pt voiced understanding, no further concerns.

## 2020-02-09 ENCOUNTER — Encounter: Payer: Self-pay | Admitting: Pain Medicine

## 2020-02-09 ENCOUNTER — Ambulatory Visit
Admission: RE | Admit: 2020-02-09 | Discharge: 2020-02-09 | Disposition: A | Discharge status: Home / Self Care | Payer: BLUE CROSS/BLUE SHIELD | Source: Ambulatory Visit | Attending: Pain Medicine | Admitting: Pain Medicine

## 2020-02-09 ENCOUNTER — Other Ambulatory Visit: Payer: Self-pay

## 2020-02-09 ENCOUNTER — Ambulatory Visit (HOSPITAL_BASED_OUTPATIENT_CLINIC_OR_DEPARTMENT_OTHER): Payer: BLUE CROSS/BLUE SHIELD | Admitting: Pain Medicine

## 2020-02-09 ENCOUNTER — Encounter: Payer: Self-pay | Admitting: Surgery

## 2020-02-09 VITALS — BP 133/99 | HR 88 | Temp 97.3°F | Resp 18 | Ht 63.0 in | Wt 190.0 lb

## 2020-02-09 DIAGNOSIS — Z888 Allergy status to other drugs, medicaments and biological substances status: Secondary | ICD-10-CM | POA: Diagnosis not present

## 2020-02-09 DIAGNOSIS — Z91013 Allergy to seafood: Secondary | ICD-10-CM | POA: Insufficient documentation

## 2020-02-09 DIAGNOSIS — Z91041 Radiographic dye allergy status: Secondary | ICD-10-CM

## 2020-02-09 DIAGNOSIS — G8929 Other chronic pain: Secondary | ICD-10-CM | POA: Diagnosis not present

## 2020-02-09 DIAGNOSIS — Z87892 Personal history of anaphylaxis: Secondary | ICD-10-CM | POA: Insufficient documentation

## 2020-02-09 DIAGNOSIS — M533 Sacrococcygeal disorders, not elsewhere classified: Secondary | ICD-10-CM

## 2020-02-09 DIAGNOSIS — M79605 Pain in left leg: Secondary | ICD-10-CM

## 2020-02-09 DIAGNOSIS — M51369 Other intervertebral disc degeneration, lumbar region without mention of lumbar back pain or lower extremity pain: Secondary | ICD-10-CM

## 2020-02-09 DIAGNOSIS — M47898 Other spondylosis, sacral and sacrococcygeal region: Secondary | ICD-10-CM | POA: Insufficient documentation

## 2020-02-09 DIAGNOSIS — M5136 Other intervertebral disc degeneration, lumbar region: Secondary | ICD-10-CM | POA: Insufficient documentation

## 2020-02-09 DIAGNOSIS — M79604 Pain in right leg: Secondary | ICD-10-CM | POA: Insufficient documentation

## 2020-02-09 MED ORDER — ROPIVACAINE HCL 2 MG/ML IJ SOLN
2.0000 mL | Freq: Once | INTRAMUSCULAR | Status: AC
  Administered 2020-02-09: 2 mL via EPIDURAL

## 2020-02-09 MED ORDER — ROPIVACAINE HCL 2 MG/ML IJ SOLN
INTRAMUSCULAR | Status: AC
  Filled 2020-02-09: qty 10

## 2020-02-09 MED ORDER — TRIAMCINOLONE ACETONIDE 40 MG/ML IJ SUSP
40.0000 mg | Freq: Once | INTRAMUSCULAR | Status: AC
  Administered 2020-02-09: 40 mg

## 2020-02-09 MED ORDER — TRIAMCINOLONE ACETONIDE 40 MG/ML IJ SUSP
INTRAMUSCULAR | Status: AC
  Filled 2020-02-09: qty 1

## 2020-02-09 MED ORDER — LIDOCAINE HCL 2 % IJ SOLN
20.0000 mL | Freq: Once | INTRAMUSCULAR | Status: AC
  Administered 2020-02-09: 400 mg

## 2020-02-09 MED ORDER — SODIUM CHLORIDE (PF) 0.9 % IJ SOLN
INTRAMUSCULAR | Status: AC
  Filled 2020-02-09: qty 10

## 2020-02-09 MED ORDER — SODIUM CHLORIDE 0.9% FLUSH
2.0000 mL | Freq: Once | INTRAVENOUS | Status: AC
  Administered 2020-02-09: 2 mL

## 2020-02-09 MED ORDER — LIDOCAINE HCL 2 % IJ SOLN
INTRAMUSCULAR | Status: AC
  Filled 2020-02-09: qty 20

## 2020-02-09 NOTE — Progress Notes (Signed)
PROVIDER NOTE: Information contained herein reflects review and annotations entered in association with encounter. Interpretation of such information and data should be left to medically-trained personnel. Information provided to patient can be located elsewhere in the medical record under "Patient Instructions". Document created using STT-dictation technology, any transcriptional errors that may result from process are unintentional.    Patient: Lisa Galvan  Service Category: Procedure  Provider: Oswaldo DoneFrancisco A Zonnie Landen, MD  DOB: [birth year]  DOS: 02/09/2020  Location: [facility]  MRN: [id number]  Setting: Ambulatory - outpatient  Referring Provider: Dorcas CarrowJohnson, Megan P, DO  Type: Established Patient  Specialty: Interventional Pain Management  PCP: Dorcas CarrowJohnson, Megan P, DO   Primary Reason for Visit: Interventional Pain Management Treatment. CC: Back Pain (lumbar bilateral, left is worse )  Procedure:          Anesthesia, Analgesia, Anxiolysis:  Type: Diagnostic Epidural Steroid Injection          Region: Caudal Level: Sacrococcygeal   Laterality: Midline       Type: Moderate (Conscious) Sedation combined with Local Anesthesia Indication(s): Analgesia and Anxiety Route: Intravenous (IV) IV Access: Secured Sedation: Meaningful verbal contact was maintained at all times during the procedure  Local Anesthetic: Lidocaine 1-2%  Position: Prone   Indications: 1. Coccygodynia   2. Chronic lower extremity pain (Secondary area of Pain) (Bilateral)   3. DDD (degenerative disc disease), lumbar   4. Other spondylosis, sacral and sacrococcygeal region    History of Allergy to iodine    History of allergy to radiographic contrast media    History of allergy to shellfish    History of anaphylaxis    Pain Score: Pre-procedure: 5 /10 Post-procedure: 5 /10   Pre-op Assessment:  Lisa Galvan is a 59 y.o. (year old), female patient, seen today for interventional treatment. She  has a past  surgical history that includes Abdominal hysterectomy; Esophagogastroduodenoscopy (egd) with propofol (N/A, 03/12/2017); Umbilical hernia repair (N/A, 05/01/2017); Hernia repair; Esophagogastroduodenoscopy (egd) with propofol (N/A, 03/30/2019); Colonoscopy with propofol (N/A, 03/30/2019); ORIF ankle fracture (Left, 07/30/2019); and XI robotic assisted ventral hernia (N/A, 10/06/2019). Lisa Galvan has a current medication list which includes the following prescription(s): acetaminophen, amlodipine, atorvastatin, vitamin d-3, cyanocobalamin, duloxetine, empagliflozin, fexofenadine, hydrochlorothiazide, ketoconazole, magnesium oxide, metformin, montelukast, omeprazole, pregabalin, ropinirole, sucralfate, and valacyclovir. Her primarily concern today is the Back Pain (lumbar bilateral, left is worse )  Initial Vital Signs:  Pulse/HCG Rate: 88ECG Heart Rate: 87 Temp: (!) 97.3 F (36.3 C) Resp: 16 BP: 121/80 SpO2: 100 %  BMI: Estimated body mass index is 33.66 kg/m as calculated from the following:   Height as of this encounter: 5\' 3"  (1.6 m).   Weight as of this encounter: 190 lb (86.2 kg).  Risk Assessment: Allergies: Reviewed. She is allergic to amlodipine besy-benazepril hcl, ivp dye [iodinated diagnostic agents], shellfish allergy, shellfish-derived products, and ace inhibitors.  Allergy Precautions: None required Coagulopathies: Reviewed. None identified.  Blood-thinner therapy: None at this time Active Infection(s): Reviewed. None identified. Lisa Galvan is afebrile  Site Confirmation: Lisa Galvan was asked to confirm the procedure and laterality before marking the site Procedure checklist: Completed Consent: Before the procedure and under the influence of no sedative(s), amnesic(s), or anxiolytics, the patient was informed of the treatment options, risks and possible complications. To fulfill our ethical and legal obligations, as recommended by the American Medical Association's Code of Ethics, I have  informed the patient of my clinical impression; the nature and purpose of the treatment or procedure; the risks,  benefits, and possible complications of the intervention; the alternatives, including doing nothing; the risk(s) and benefit(s) of the alternative treatment(s) or procedure(s); and the risk(s) and benefit(s) of doing nothing. The patient was provided information about the general risks and possible complications associated with the procedure. These may include, but are not limited to: failure to achieve desired goals, infection, bleeding, organ or nerve damage, allergic reactions, paralysis, and death. In addition, the patient was informed of those risks and complications associated to Spine-related procedures, such as failure to decrease pain; infection (i.e.: Meningitis, epidural or intraspinal abscess); bleeding (i.e.: epidural hematoma, subarachnoid hemorrhage, or any other type of intraspinal or peri-dural bleeding); organ or nerve damage (i.e.: Any type of peripheral nerve, nerve root, or spinal cord injury) with subsequent damage to sensory, motor, and/or autonomic systems, resulting in permanent pain, numbness, and/or weakness of one or several areas of the body; allergic reactions; (i.e.: anaphylactic reaction); and/or death. Furthermore, the patient was informed of those risks and complications associated with the medications. These include, but are not limited to: allergic reactions (i.e.: anaphylactic or anaphylactoid reaction(s)); adrenal axis suppression; blood sugar elevation that in diabetics may result in ketoacidosis or comma; water retention that in patients with history of congestive heart failure may result in shortness of breath, pulmonary edema, and decompensation with resultant heart failure; weight gain; swelling or edema; medication-induced neural toxicity; particulate matter embolism and blood vessel occlusion with resultant organ, and/or nervous system infarction; and/or  aseptic necrosis of one or more joints. Finally, the patient was informed that Medicine is not an exact science; therefore, there is also the possibility of unforeseen or unpredictable risks and/or possible complications that may result in a catastrophic outcome. The patient indicated having understood very clearly. We have given the patient no guarantees and we have made no promises. Enough time was given to the patient to ask questions, all of which were answered to the patient's satisfaction. Ms. Speakman has indicated that she wanted to continue with the procedure. Attestation: I, the ordering provider, attest that I have discussed with the patient the benefits, risks, side-effects, alternatives, likelihood of achieving goals, and potential problems during recovery for the procedure that I have provided informed consent. Date  Time: 02/09/2020 11:52 AM  Pre-Procedure Preparation:  Monitoring: As per clinic protocol. Respiration, ETCO2, SpO2, BP, heart rate and rhythm monitor placed and checked for adequate function Safety Precautions: Patient was assessed for positional comfort and pressure points before starting the procedure. Time-out: I initiated and conducted the "Time-out" before starting the procedure, as per protocol. The patient was asked to participate by confirming the accuracy of the "Time Out" information. Verification of the correct person, site, and procedure were performed and confirmed by me, the nursing staff, and the patient. "Time-out" conducted as per Joint Commission's Universal Protocol (UP.01.01.01). Time: 1209  Description of Procedure:          Target Area: Caudal Epidural Canal. Approach: Midline approach. Area Prepped: Entire Posterior Sacrococcygeal Region DuraPrep (Iodine Povacrylex [0.7% available iodine] and Isopropyl Alcohol, 74% w/w) Safety Precautions: Aspiration looking for blood return was conducted prior to all injections. At no point did we inject any  substances, as a needle was being advanced. No attempts were made at seeking any paresthesias. Safe injection practices and needle disposal techniques used. Medications properly checked for expiration dates. SDV (single dose vial) medications used. Description of the Procedure: Protocol guidelines were followed. The patient was placed in position over the fluoroscopy table. The target area was  identified and the area prepped in the usual manner. Skin & deeper tissues infiltrated with local anesthetic. Appropriate amount of time allowed to pass for local anesthetics to take effect. The procedure needles were then advanced to the target area. Proper needle placement secured. Negative aspiration confirmed. Solution injected in intermittent fashion, asking for systemic symptoms every 0.5cc of injectate. The needles were then removed and the area cleansed, making sure to leave some of the prepping solution back to take advantage of its long term bactericidal properties. Vitals:   02/09/20 1146 02/09/20 1209 02/09/20 1214  BP: 121/80 (!) 132/92 (!) 133/99  Pulse: 88    Resp: 16 18 18   Temp: (!) 97.3 F (36.3 C)    TempSrc: Temporal    SpO2: 100% 98% 100%  Weight: 190 lb (86.2 kg)    Height: 5\' 3"  (1.6 m)      Start Time: 1209 hrs. End Time: 1213 hrs. Materials:  Needle(s) Type: Epidural needle Gauge: 17G Length: 3.5-in Medication(s): Please see orders for medications and dosing details.  Imaging Guidance (Spinal):          Type of Imaging Technique: Fluoroscopy Guidance (Spinal) Indication(s): Assistance in needle guidance and placement for procedures requiring needle placement in or near specific anatomical locations not easily accessible without such assistance. Exposure Time: Please see nurses notes. Contrast: None used. Fluoroscopic Guidance: I was personally present during the use of fluoroscopy. "Tunnel Vision Technique" used to obtain the best possible view of the target area. Parallax  error corrected before commencing the procedure. "Direction-depth-direction" technique used to introduce the needle under continuous pulsed fluoroscopy. Once target was reached, antero-posterior, oblique, and lateral fluoroscopic projection used confirm needle placement in all planes. Images permanently stored in EMR. Interpretation: No contrast injected. I personally interpreted the imaging intraoperatively. Adequate needle placement confirmed in multiple planes. Permanent images saved into the patient's record.  Antibiotic Prophylaxis:   Anti-infectives (From admission, onward)   None     Indication(s): None identified  Post-operative Assessment:  Post-procedure Vital Signs:  Pulse/HCG Rate: 8888 Temp: (!) 97.3 F (36.3 C) Resp: 18 BP: (!) 133/99 SpO2: 100 %  EBL: None  Complications: No immediate post-treatment complications observed by team, or reported by patient.  Note: The patient tolerated the entire procedure well. A repeat set of vitals were taken after the procedure and the patient was kept under observation following institutional policy, for this type of procedure. Post-procedural neurological assessment was performed, showing return to baseline, prior to discharge. The patient was provided with post-procedure discharge instructions, including a section on how to identify potential problems. Should any problems arise concerning this procedure, the patient was given instructions to immediately contact , at any time, without hesitation. In any case, we plan to contact the patient by telephone for a follow-up status report regarding this interventional procedure.  Comments:  No additional relevant information.  Plan of Care  Orders:  Orders Placed This Encounter  Procedures  . Caudal Epidural Injection    Scheduling Instructions:     Laterality: Midline     Level(s): Sacrococcygeal canal (Tailbone area)     Sedation: Patient's choice     Timeframe: Today    Order  Specific Question:   Where will this procedure be performed?    Answer:   [facility]  . DG PAIN [facility] C-ARM 1-60 MIN NO REPORT    Intraoperative interpretation by procedural physician at [facility].    Standing Status:   Standing  Number of Occurrences:   1    Order Specific Question:   Reason for exam:    Answer:   Assistance in needle guidance and placement for procedures requiring needle placement in or near specific anatomical locations not easily accessible without such assistance.  . Informed Consent Details: Physician/Practitioner Attestation; Transcribe to consent form and obtain patient signature    Nursing Order: Transcribe to consent form and obtain patient signature. Note: Always confirm laterality of pain with Ms. Richardson Dopp, before procedure.    Order Specific Question:   Physician/Practitioner attestation of informed consent for procedure/surgical case    Answer:   I, the physician/practitioner, attest that I have discussed with the patient the benefits, risks, side effects, alternatives, likelihood of achieving goals and potential problems during recovery for the procedure that I have provided informed consent.    Order Specific Question:   Procedure    Answer:   Caudal epidural steroid injection    Order Specific Question:   Physician/Practitioner performing the procedure    Answer:   Scotlyn Mccranie A. Laban Emperor, MD    Order Specific Question:   Indication/Reason    Answer:   Low back pain and lower extremity pain secondary to lumbosacral radiculitis  . Provide equipment / supplies at bedside    "Epidural Tray" (Disposable  single use) Catheter: NOT required    Standing Status:   Standing    Number of Occurrences:   1    Order Specific Question:   Specify    Answer:   Epidural Tray  . Miscellanous precautions    Standing Status:   Standing    Number of Occurrences:   1  . Miscellanous precautions    NOTE: Although It is true that patients can have allergies  to shellfish and that shellfish contain iodine, most shellfish  allergies are due to two protein allergens present in the shellfish: tropomyosins and parvalbumin. Not all patients with shellfish allergies are allergic to iodine. However, as a precaution, avoid using iodine containing products.    Standing Status:   Standing    Number of Occurrences:   1   Chronic Opioid Analgesic:  No opioid analgesics prescribed by our practice.   Medications ordered for procedure: Meds ordered this encounter  Medications  . lidocaine (XYLOCAINE) 2 % (with pres) injection 400 mg  . sodium chloride flush (NS) 0.9 % injection 2 mL  . ropivacaine (PF) 2 mg/mL (0.2%) (NAROPIN) injection 2 mL  . triamcinolone acetonide (KENALOG-40) injection 40 mg   Medications administered: We administered lidocaine, sodium chloride flush, ropivacaine (PF) 2 mg/mL (0.2%), and triamcinolone acetonide.  See the medical record for exact dosing, route, and time of administration.  Follow-up plan:   Return in about 2 weeks (around 02/23/2020) for (VV), (PP) Follow-up.       Interventional management options: Planned, scheduled, and/or pending:       Considering:   Diagnostic caudal ESI #1  Diagnostic/therapeutic right L4-5 LESI x1 (done by Dr. Metta Clines on 12/29/2014)   PRN Procedures:   Palliative right lumbar facet RFA #2 (last done 06/02/2019)  Palliative left lumbar facet RFA #2 (last done 06/30/2019)  Palliative bilateral lumbar facet block #3  Palliative right sacroiliac joint block #2      Recent Visits Date Type Provider Dept  02/01/20 Office Visit Delano Metz, MD [facility]  Showing recent visits within past 90 days and meeting all other requirements Today's Visits Date Type Provider Dept  02/09/20 Procedure visit Delano Metz, MD [facility]  Showing today's visits and meeting all other requirements Future Appointments Date Type Provider Dept  02/23/20 Appointment Delano Metz, MD [facility]  Showing future appointments within next 90 days and meeting all other requirements  Disposition: Discharge home  Discharge (Date  Time): 02/09/2020; 1225 hrs.   Primary Care Physician: Dorcas Carrow, DO Location: [facility] Note by: Oswaldo Done, MD Date: 02/09/2020; Time: 1:04 PM  Disclaimer:  Medicine is not an Visual merchandiser. The only guarantee in medicine is that nothing is guaranteed. It is important to note that the decision to proceed with this intervention was based on the information collected from the patient. The Data and conclusions were drawn from the patient's questionnaire, the interview, and the physical examination. Because the information was provided in large part by the patient, it cannot be guaranteed that it has not been purposely or unconsciously manipulated. Every effort has been made to obtain as much relevant data as possible for this evaluation. It is important to note that the conclusions that lead to this procedure are derived in large part from the available data. Always take into account that the treatment will also be dependent on availability of resources and existing treatment guidelines, considered by other Pain Management Practitioners as being common knowledge and practice, at the time of the intervention. For Medico-Legal purposes, it is also important to point out that variation in procedural techniques and pharmacological choices are the acceptable norm. The indications, contraindications, technique, and results of the above procedure should only be interpreted and judged by a Board-Certified Interventional Pain Specialist with extensive familiarity and expertise in the same exact procedure and technique.

## 2020-02-09 NOTE — Patient Instructions (Addendum)
____________________________________________________________________________________________  Post-Procedure Discharge Instructions  Instructions:  Apply ice:   Purpose: This will minimize any swelling and discomfort after procedure.   When: Day of procedure, as soon as you get home.  How: Fill a plastic sandwich bag with crushed ice. Cover it with a small towel and apply to injection site.  How long: (15 min on, 15 min off) Apply for 15 minutes then remove x 15 minutes.  Repeat sequence on day of procedure, until you go to bed.  Apply heat:   Purpose: To treat any soreness and discomfort from the procedure.  When: Starting the next day after the procedure.  How: Apply heat to procedure site starting the day following the procedure.  How long: May continue to repeat daily, until discomfort goes away.  Food intake: Start with clear liquids (like water) and advance to regular food, as tolerated.   Physical activities: Keep activities to a minimum for the first 8 hours after the procedure. After that, then as tolerated.  Driving: If you have received any sedation, be responsible and do not drive. You are not allowed to drive for 24 hours after having sedation.  Blood thinner: (Applies only to those taking blood thinners) You may restart your blood thinner 6 hours after your procedure.  Insulin: (Applies only to Diabetic patients taking insulin) As soon as you can eat, you may resume your normal dosing schedule.  Infection prevention: Keep procedure site clean and dry. Shower daily and clean area with soap and water.  Post-procedure Pain Diary: Extremely important that this be done correctly and accurately. Recorded information will be used to determine the next step in treatment. For the purpose of accuracy, follow these rules:  Evaluate only the area treated. Do not report or include pain from an untreated area. For the purpose of this evaluation, ignore all other areas of pain,  except for the treated area.  After your procedure, avoid taking a long nap and attempting to complete the pain diary after you wake up. Instead, set your alarm clock to go off every hour, on the hour, for the initial 8 hours after the procedure. Document the duration of the numbing medicine, and the relief you are getting from it.  Do not go to sleep and attempt to complete it later. It will not be accurate. If you received sedation, it is likely that you were given a medication that may cause amnesia. Because of this, completing the diary at a later time may cause the information to be inaccurate. This information is needed to plan your care.  Follow-up appointment: Keep your post-procedure follow-up evaluation appointment after the procedure (usually 2 weeks for most procedures, 6 weeks for radiofrequencies). DO NOT FORGET to bring you pain diary with you.   Expect: (What should I expect to see with my procedure?)  From numbing medicine (AKA: Local Anesthetics): Numbness or decrease in pain. You may also experience some weakness, which if present, could last for the duration of the local anesthetic.  Onset: Full effect within 15 minutes of injected.  Duration: It will depend on the type of local anesthetic used. On the average, 1 to 8 hours.   From steroids (Applies only if steroids were used): Decrease in swelling or inflammation. Once inflammation is improved, relief of the pain will follow.  Onset of benefits: Depends on the amount of swelling present. The more swelling, the longer it will take for the benefits to be seen. In some cases, up to 10 days.    Duration: Steroids will stay in the system x 2 weeks. Duration of benefits will depend on multiple posibilities including persistent irritating factors.  Side-effects: If present, they may typically last 2 weeks (the duration of the steroids).  Frequent: Cramps (if they occur, drink Gatorade and take over-the-counter Magnesium 450-500 mg  once to twice a day); water retention with temporary weight gain; increases in blood sugar; decreased immune system response; increased appetite.  Occasional: Facial flushing (red, warm cheeks); mood swings; menstrual changes.  Uncommon: Long-term decrease or suppression of natural hormones; bone thinning. (These are more common with higher doses or more frequent use. This is why we prefer that our patients avoid having any injection therapies in other practices.)   Very Rare: Severe mood changes; psychosis; aseptic necrosis.  From procedure: Some discomfort is to be expected once the numbing medicine wears off. This should be minimal if ice and heat are applied as instructed.  Call if: (When should I call?)  You experience numbness and weakness that gets worse with time, as opposed to wearing off.  New onset bowel or bladder incontinence. (Applies only to procedures done in the spine)  Emergency Numbers:  Durning business hours (Monday - Thursday, 8:00 AM - 4:00 PM) (Friday, 9:00 AM - 12:00 Noon): [phone]  After hours: [phone]  NOTE: If you are having a problem and are unable connect with, or to talk to a provider, then go to your nearest urgent care or emergency department. If the problem is serious and urgent, please call 911. ____________________________________________________________________________________________   GENERAL RISKS AND COMPLICATIONS  What are the risk, side effects and possible complications? Generally speaking, most procedures are safe.  However, with any procedure there are risks, side effects, and the possibility of complications.  The risks and complications are dependent upon the sites that are lesioned, or the type of nerve block to be performed.  The closer the procedure is to the spine, the more serious the risks are.  Great care is taken when placing the radio frequency needles, block needles or lesioning probes, but sometimes complications  can occur. 1. Infection: Any time there is an injection through the skin, there is a risk of infection.  This is why sterile conditions are used for these blocks.  There are four possible types of infection. 1. Localized skin infection. 2. Central Nervous System Infection-This can be in the form of Meningitis, which can be deadly. 3. Epidural Infections-This can be in the form of an epidural abscess, which can cause pressure inside of the spine, causing compression of the spinal cord with subsequent paralysis. This would require an emergency surgery to decompress, and there are no guarantees that the patient would recover from the paralysis. 4. Discitis-This is an infection of the intervertebral discs.  It occurs in about 1% of discography procedures.  It is difficult to treat and it may lead to surgery.        2. Pain: the needles have to go through skin and soft tissues, will cause soreness.       3. Damage to internal structures:  The nerves to be lesioned may be near blood vessels or    other nerves which can be potentially damaged.       4. Bleeding: Bleeding is more common if the patient is taking blood thinners such as  aspirin, Coumadin, Ticiid, Plavix, etc., or if he/she have some genetic predisposition  such as hemophilia. Bleeding into the spinal canal can cause compression of the   spinal  cord with subsequent paralysis.  This would require an emergency surgery to  decompress and there are no guarantees that the patient would recover from the  paralysis.       5. Pneumothorax:  Puncturing of a lung is a possibility, every time a needle is introduced in  the area of the chest or upper back.  Pneumothorax refers to free air around the  collapsed lung(s), inside of the thoracic cavity (chest cavity).  Another two possible  complications related to a similar event would include: Hemothorax and Chylothorax.   These are variations of the Pneumothorax, where instead of air around the collapsed   lung(s), you may have blood or chyle, respectively.       6. Spinal headaches: They may occur with any procedures in the area of the spine.       7. Persistent CSF (Cerebro-Spinal Fluid) leakage: This is a rare problem, but may occur  with prolonged intrathecal or epidural catheters either due to the formation of a fistulous  track or a dural tear.       8. Nerve damage: By working so close to the spinal cord, there is always a possibility of  nerve damage, which could be as serious as a permanent spinal cord injury with  paralysis.       9. Death:  Although rare, severe deadly allergic reactions known as "Anaphylactic  reaction" can occur to any of the medications used.      10. Worsening of the symptoms:  We can always make thing worse.  What are the chances of something like this happening? Chances of any of this occuring are extremely low.  By statistics, you have more of a chance of getting killed in a motor vehicle accident: while driving to the hospital than any of the above occurring .  Nevertheless, you should be aware that they are possibilities.  In general, it is similar to taking a shower.  Everybody knows that you can slip, hit your head and get killed.  Does that mean that you should not shower again?  Nevertheless always keep in mind that statistics do not mean anything if you happen to be on the wrong side of them.  Even if a procedure has a 1 (one) in a 1,000,000 (million) chance of going wrong, it you happen to be that one..Also, keep in mind that by statistics, you have more of a chance of having something go wrong when taking medications.  Who should not have this procedure? If you are on a blood thinning medication (e.g. Coumadin, Plavix, see list of "Blood Thinners"), or if you have an active infection going on, you should not have the procedure.  If you are taking any blood thinners, please inform your physician.  How should I prepare for this procedure?  Do not eat or drink  anything at least six hours prior to the procedure.  Bring a driver with you .  It cannot be a taxi.  Come accompanied by an adult that can drive you back, and that is strong enough to help you if your legs get weak or numb from the local anesthetic.  Take all of your medicines the morning of the procedure with just enough water to swallow them.  If you have diabetes, make sure that you are scheduled to have your procedure done first thing in the morning, whenever possible.  If you have diabetes, take only half of your insulin dose and notify our nurse   that you have done so as soon as you arrive at the clinic.  If you are diabetic, but only take blood sugar pills (oral hypoglycemic), then do not take them on the morning of your procedure.  You may take them after you have had the procedure.  Do not take aspirin or any aspirin-containing medications, at least eleven (11) days prior to the procedure.  They may prolong bleeding.  Wear loose fitting clothing that may be easy to take off and that you would not mind if it got stained with Betadine or blood.  Do not wear any jewelry or perfume  Remove any nail coloring.  It will interfere with some of our monitoring equipment.  NOTE: Remember that this is not meant to be interpreted as a complete list of all possible complications.  Unforeseen problems may occur.  BLOOD THINNERS The following drugs contain aspirin or other products, which can cause increased bleeding during surgery and should not be taken for 2 weeks prior to and 1 week after surgery.  If you should need take something for relief of minor pain, you may take acetaminophen which is found in Tylenol,m Datril, Anacin-3 and Panadol. It is not blood thinner. The products listed below are.  Do not take any of the products listed below in addition to any listed on your instruction sheet.  A.P.C or A.P.C with Codeine Codeine Phosphate Capsules #3 Ibuprofen Ridaura  ABC compound  Congesprin Imuran rimadil  Advil Cope Indocin Robaxisal  Alka-Seltzer Effervescent Pain Reliever and Antacid Coricidin or Coricidin-D  Indomethacin Rufen  Alka-Seltzer plus Cold Medicine Cosprin Ketoprofen S-A-C Tablets  Anacin Analgesic Tablets or Capsules Coumadin Korlgesic Salflex  Anacin Extra Strength Analgesic tablets or capsules CP-2 Tablets Lanoril Salicylate  Anaprox Cuprimine Capsules Levenox Salocol  Anexsia-D Dalteparin Magan Salsalate  Anodynos Darvon compound Magnesium Salicylate Sine-off  Ansaid Dasin Capsules Magsal Sodium Salicylate  Anturane Depen Capsules Marnal Soma  APF Arthritis pain formula Dewitt's Pills Measurin Stanback  Argesic Dia-Gesic Meclofenamic Sulfinpyrazone  Arthritis Bayer Timed Release Aspirin Diclofenac Meclomen Sulindac  Arthritis pain formula Anacin Dicumarol Medipren Supac  Analgesic (Safety coated) Arthralgen Diffunasal Mefanamic Suprofen  Arthritis Strength Bufferin Dihydrocodeine Mepro Compound Suprol  Arthropan liquid Dopirydamole Methcarbomol with Aspirin Synalgos  ASA tablets/Enseals Disalcid Micrainin Tagament  Ascriptin Doan's Midol Talwin  Ascriptin A/D Dolene Mobidin Tanderil  Ascriptin Extra Strength Dolobid Moblgesic Ticlid  Ascriptin with Codeine Doloprin or Doloprin with Codeine Momentum Tolectin  Asperbuf Duoprin Mono-gesic Trendar  Aspergum Duradyne Motrin or Motrin IB Triminicin  Aspirin plain, buffered or enteric coated Durasal Myochrisine Trigesic  Aspirin Suppositories Easprin Nalfon Trillsate  Aspirin with Codeine Ecotrin Regular or Extra Strength Naprosyn Uracel  Atromid-S Efficin Naproxen Ursinus  Auranofin Capsules Elmiron Neocylate Vanquish  Axotal Emagrin Norgesic Verin  Azathioprine Empirin or Empirin with Codeine Normiflo Vitamin E  Azolid Emprazil Nuprin Voltaren  Bayer Aspirin plain, buffered or children's or timed BC Tablets or powders Encaprin Orgaran Warfarin Sodium  Buff-a-Comp Enoxaparin Orudis Zorpin   Buff-a-Comp with Codeine Equegesic Os-Cal-Gesic   Buffaprin Excedrin plain, buffered or Extra Strength Oxalid   Bufferin Arthritis Strength Feldene Oxphenbutazone   Bufferin plain or Extra Strength Feldene Capsules Oxycodone with Aspirin   Bufferin with Codeine Fenoprofen Fenoprofen Pabalate or Pabalate-SF   Buffets II Flogesic Panagesic   Buffinol plain or Extra Strength Florinal or Florinal with Codeine Panwarfarin   Buf-Tabs Flurbiprofen Penicillamine   Butalbital Compound Four-way cold tablets Penicillin   Butazolidin Fragmin Pepto-Bismol   Carbenicillin Geminisyn Percodan     Carna Arthritis Reliever Geopen Persantine   Carprofen Gold's salt Persistin   Chloramphenicol Goody's Phenylbutazone   Chloromycetin Haltrain Piroxlcam   Clmetidine heparin Plaquenil   Cllnoril Hyco-pap Ponstel   Clofibrate Hydroxy chloroquine Propoxyphen         Before stopping any of these medications, be sure to consult the physician who ordered them.  Some, such as Coumadin (Warfarin) are ordered to prevent or treat serious conditions such as "deep thrombosis", "pumonary embolisms", and other heart problems.  The amount of time that you may need off of the medication may also vary with the medication and the reason for which you were taking it.  If you are taking any of these medications, please make sure you notify your pain physician before you undergo any procedures.         Pain Management Discharge Instructions  General Discharge Instructions :  If you need to reach your doctor call: Monday-Friday 8:00 am - 4:00 pm at [phone] or toll free [phone].  After clinic hours [phone] to have operator reach doctor.  Bring all of your medication bottles to all your appointments in the pain clinic.  To cancel or reschedule your appointment with Pain Management please remember to call 24 hours in advance to avoid a fee.  Refer to the educational materials which you have been given on:  General Risks, I had my Procedure. Discharge Instructions, Post Sedation.  Post Procedure Instructions:  The drugs you were given will stay in your system until tomorrow, so for the next 24 hours you should not drive, make any legal decisions or drink any alcoholic beverages.  You may eat anything you prefer, but it is better to start with liquids then soups and crackers, and gradually work up to solid foods.  Please notify your doctor immediately if you have any unusual bleeding, trouble breathing or pain that is not related to your normal pain.  Depending on the type of procedure that was done, some parts of your body may feel week and/or numb.  This usually clears up by tonight or the next day.  Walk with the use of an assistive device or accompanied by an adult for the 24 hours.  You may use ice on the affected area for the first 24 hours.  Put ice in a Ziploc bag and cover with a towel and place against area 15 minutes on 15 minutes off.  You may switch to heat after 24 hours.Epidural Steroid Injection Patient Information  Description: The epidural space surrounds the nerves as they exit the spinal cord.  In some patients, the nerves can be compressed and inflamed by a bulging disc or a tight spinal canal (spinal stenosis).  By injecting steroids into the epidural space, we can bring irritated nerves into direct contact with a potentially helpful medication.  These steroids act directly on the irritated nerves and can reduce swelling and inflammation which often leads to decreased pain.  Epidural steroids may be injected anywhere along the spine and from the neck to the low back depending upon the location of your pain.   After numbing the skin with local anesthetic (like Novocaine), a small needle is passed into the epidural space slowly.  You may experience a sensation of pressure while this is being done.  The entire block usually last less than 10 minutes.  Conditions which may be  treated by epidural steroids:   Low back and leg pain  Neck and arm pain  Spinal stenosis  Post-laminectomy   syndrome  Herpes zoster (shingles) pain  Pain from compression fractures  Preparation for the injection:  1. Do not eat any solid food or dairy products within 8 hours of your appointment.  2. You may drink clear liquids up to 3 hours before appointment.  Clear liquids include water, black coffee, juice or soda.  No milk or cream please. 3. You may take your regular medication, including pain medications, with a sip of water before your appointment  Diabetics should hold regular insulin (if taken separately) and take 1/2 normal NPH dos the morning of the procedure.  Carry some sugar containing items with you to your appointment. 4. A driver must accompany you and be prepared to drive you home after your procedure.  5. Bring all your current medications with your. 6. An IV may be inserted and sedation may be given at the discretion of the physician.   7. A blood pressure cuff, EKG and other monitors will often be applied during the procedure.  Some patients may need to have extra oxygen administered for a short period. 8. You will be asked to provide medical information, including your allergies, prior to the procedure.  We must know immediately if you are taking blood thinners (like Coumadin/Warfarin)  Or if you are allergic to IV iodine contrast (dye). We must know if you could possible be pregnant.  Possible side-effects:  Bleeding from needle site  Infection (rare, may require surgery)  Nerve injury (rare)  Numbness & tingling (temporary)  Difficulty urinating (rare, temporary)  Spinal headache ( a headache worse with upright posture)  Light -headedness (temporary)  Pain at injection site (several days)  Decreased blood pressure (temporary)  Weakness in arm/leg (temporary)  Pressure sensation in back/neck (temporary)  Call if you experience:  Fever/chills  associated with headache or increased back/neck pain.  Headache worsened by an upright position.  New onset weakness or numbness of an extremity below the injection site  Hives or difficulty breathing (go to the emergency room)  Inflammation or drainage at the infection site  Severe back/neck pain  Any new symptoms which are concerning to you  Please note:  Although the local anesthetic injected can often make your back or neck feel good for several hours after the injection, the pain will likely return.  It takes 3-7 days for steroids to work in the epidural space.  You may not notice any pain relief for at least that one week.  If effective, we will often do a series of three injections spaced 3-6 weeks apart to maximally decrease your pain.  After the initial series, we generally will wait several months before considering a repeat injection of the same type.  If you have any questions, please call [phone] [facility] 

## 2020-02-09 NOTE — Progress Notes (Signed)
Outpatient Surgical Follow Up  02/09/2020  Lisa Galvan is an 59 y.o. female.   Chief Complaint  Patient presents with  . Follow-up    discuss CT A/P acid reflux    HPI: Lisa Galvan is a 59 year old female with significant reflux requiring PPI Carafate.     She had a biotic ventral hernia repair by me 5 months ago.  She also had significant reflux symptoms that currently are well managed with medication.    Now comes for persistent lump on the left upper quadrant from one of the port incisions.  She also had a recent CT scan that have personally reviewed confirming the left subcostal incisional hernia, there is also evidence of transverse colon within the hernia sac.Marland Kitchen No fevers no chills.  No nausea no vomiting.  Is otherwise doing much better.    He does report some intermittent pain around the hernia.  It is moderate to mild intensity worsening with Valsalva. She Does have a history of chronic back pain and she follows with the pain clinic.   Past Medical History:  Diagnosis Date  . Allergy   . Arthritis    spine  . Bilateral leg edema   . Chest pain 12/15/2014  . Chronic sciatica 12/10/2014  . Essential hypertension 12/10/2014  . Gastroesophageal reflux disease without esophagitis   . Mixed hyperlipidemia   . Obesity (BMI 30.0-34.9) 12/10/2014  . Type 2 diabetes mellitus without complication (HCC) 12/10/2014  . Umbilical hernia     Past Surgical History:  Procedure Laterality Date  . ABDOMINAL HYSTERECTOMY    . COLONOSCOPY WITH PROPOFOL N/A 03/30/2019   Procedure: COLONOSCOPY WITH PROPOFOL;  Surgeon: Toney Reil, MD;  Location: [facility] ENDOSCOPY;  Service: Gastroenterology;  Laterality: N/A;  . ESOPHAGOGASTRODUODENOSCOPY (EGD) WITH PROPOFOL N/A 03/12/2017   Procedure: ESOPHAGOGASTRODUODENOSCOPY (EGD) WITH PROPOFOL;  Surgeon: Toney Reil, MD;  Location: [facility] ENDOSCOPY;  Service: Gastroenterology;  Laterality: N/A;  . ESOPHAGOGASTRODUODENOSCOPY (EGD) WITH PROPOFOL N/A  03/30/2019   Procedure: ESOPHAGOGASTRODUODENOSCOPY (EGD) WITH PROPOFOL;  Surgeon: Toney Reil, MD;  Location: [facility] ENDOSCOPY;  Service: Gastroenterology;  Laterality: N/A;  . HERNIA REPAIR    . ORIF ANKLE FRACTURE Left 07/30/2019   Procedure: OPEN REDUCTION INTERNAL FIXATION (ORIF) OF LEFT BIMALLEOLAR ANKLE FRACTURE;  Surgeon: Signa Kell, MD;  Location: [facility];  Service: Orthopedics;  Laterality: Left;  Diabetic - oral meds  . UMBILICAL HERNIA REPAIR N/A 05/01/2017   Procedure: HERNIA REPAIR UMBILICAL ADULT;  Surgeon: Ancil Linsey, MD;  Location: [facility] ORS;  Service: General;  Laterality: N/A;  . XI ROBOTIC ASSISTED VENTRAL HERNIA N/A 10/06/2019   Procedure: XI ROBOTIC ASSISTED VENTRAL HERNIA;  Surgeon: Leafy Ro, MD;  Location: [facility] ORS;  Service: General;  Laterality: N/A;    Family History  Problem Relation Age of Onset  . Diabetes Mother   . Cancer Mother        breast and stomach  . Breast cancer Mother 43  . Dementia Mother   . Heart disease Father   . Kidney disease Father        dialysis  . Cancer Father        colon  . Diabetes Father   . Hypertension Sister   . Hypertension Brother   . Congestive Heart Failure Maternal Grandmother   . Diabetes Maternal Grandmother   . Hypertension Maternal Grandmother     Social History:  reports that she has never smoked. She has never used smokeless tobacco. She reports current  alcohol use of about 3.0 standard drinks of alcohol per week. She reports that she does not use drugs.  Allergies:  Allergies  Allergen Reactions  . Amlodipine Besy-Benazepril Hcl Anaphylaxis  . Ivp Dye [Iodinated Diagnostic Agents] Anaphylaxis    Pt is unaware   . Shellfish Allergy Swelling and Anaphylaxis  . Shellfish-Derived Products Anaphylaxis  . Ace Inhibitors Swelling    Medications reviewed.    ROS Full ROS performed and is otherwise negative other than what is stated in HPI   BP 114/76   Pulse 90   Temp 98.3  F (36.8 C) (Oral)   Resp 12   Ht 5\' 3"  (1.6 m)   Wt 190 lb (86.2 kg)   SpO2 93%   BMI 33.66 kg/m   Physical Exam Vitals and nursing note reviewed. Exam conducted with a chaperone present.  Constitutional:      General: She is not in acute distress.    Appearance: Normal appearance. She is obese.  Eyes:     General:        Right eye: No discharge.        Left eye: No discharge.  Cardiovascular:     Rate and Rhythm: Normal rate and regular rhythm.     Pulses: Normal pulses.     Heart sounds: No friction rub.  Pulmonary:     Effort: No respiratory distress.     Breath sounds: Normal breath sounds. No stridor.  Abdominal:     General: Abdomen is flat. There is no distension.     Palpations: Abdomen is soft. There is no mass.     Tenderness: There is no abdominal tenderness. There is no guarding or rebound.     Hernia: A hernia is present.  Musculoskeletal:        General: No swelling or tenderness. Normal range of motion.     Cervical back: Normal range of motion and neck supple. No rigidity or tenderness.  Lymphadenopathy:     Cervical: No cervical adenopathy.  Skin:    General: Skin is warm and dry.     Capillary Refill: Capillary refill takes less than 2 seconds.  Neurological:     General: No focal deficit present.     Mental Status: She is alert and oriented to person, place, and time.  Psychiatric:        Mood and Affect: Mood normal.        Behavior: Behavior normal.        Thought Content: Thought content normal.        Judgment: Judgment normal.        Assessment/Plan: 59 year old female with recurrent symptomatic incisional hernia left subcostal area.  Discussed with the patient in detail I do think that the best option for her is to perform an open repair with mesh. This is given the high likelihood for her performing hernia or any of the incision ports.  Procedure discussed with patient in detail.  Risks, benefits and possible complications including but  not limited to: Bleeding, infection, recurrence, bowel injuries.  She understands and wishes to proceed.  She would like to wait until after the Thanksgiving holiday for the surgery before scheduling the operation. Greater than 50% of the 40 minutes  visit was spent in counseling/coordination of care   03-31-1987, MD [facility] General Surgeon

## 2020-02-10 ENCOUNTER — Telehealth: Payer: Self-pay | Admitting: *Deleted

## 2020-02-10 DIAGNOSIS — Z9889 Other specified postprocedural states: Secondary | ICD-10-CM | POA: Diagnosis not present

## 2020-02-10 DIAGNOSIS — Z8781 Personal history of (healed) traumatic fracture: Secondary | ICD-10-CM | POA: Diagnosis not present

## 2020-02-10 NOTE — Telephone Encounter (Signed)
No problems post procedure. 

## 2020-02-11 ENCOUNTER — Ambulatory Visit: Payer: BLUE CROSS/BLUE SHIELD | Admitting: Pain Medicine

## 2020-02-18 ENCOUNTER — Other Ambulatory Visit: Payer: Self-pay

## 2020-02-18 ENCOUNTER — Telehealth: Payer: Self-pay

## 2020-02-18 ENCOUNTER — Encounter: Payer: Self-pay | Admitting: Family Medicine

## 2020-02-18 ENCOUNTER — Ambulatory Visit (INDEPENDENT_AMBULATORY_CARE_PROVIDER_SITE_OTHER): Payer: BLUE CROSS/BLUE SHIELD | Admitting: Family Medicine

## 2020-02-18 VITALS — BP 114/83 | HR 83 | Temp 97.6°F | Ht 63.0 in | Wt 188.0 lb

## 2020-02-18 DIAGNOSIS — B029 Zoster without complications: Secondary | ICD-10-CM

## 2020-02-18 DIAGNOSIS — K439 Ventral hernia without obstruction or gangrene: Secondary | ICD-10-CM

## 2020-02-18 DIAGNOSIS — E1140 Type 2 diabetes mellitus with diabetic neuropathy, unspecified: Secondary | ICD-10-CM

## 2020-02-18 DIAGNOSIS — I10 Essential (primary) hypertension: Secondary | ICD-10-CM | POA: Diagnosis not present

## 2020-02-18 DIAGNOSIS — Z0000 Encounter for general adult medical examination without abnormal findings: Secondary | ICD-10-CM | POA: Diagnosis not present

## 2020-02-18 DIAGNOSIS — Z23 Encounter for immunization: Secondary | ICD-10-CM | POA: Diagnosis not present

## 2020-02-18 DIAGNOSIS — E559 Vitamin D deficiency, unspecified: Secondary | ICD-10-CM

## 2020-02-18 DIAGNOSIS — E1142 Type 2 diabetes mellitus with diabetic polyneuropathy: Secondary | ICD-10-CM

## 2020-02-18 DIAGNOSIS — M792 Neuralgia and neuritis, unspecified: Secondary | ICD-10-CM

## 2020-02-18 DIAGNOSIS — E785 Hyperlipidemia, unspecified: Secondary | ICD-10-CM

## 2020-02-18 DIAGNOSIS — G8929 Other chronic pain: Secondary | ICD-10-CM

## 2020-02-18 LAB — URINALYSIS, ROUTINE W REFLEX MICROSCOPIC
Bilirubin, UA: NEGATIVE
Ketones, UA: NEGATIVE
Leukocytes,UA: NEGATIVE
Nitrite, UA: NEGATIVE
Protein,UA: NEGATIVE
RBC, UA: NEGATIVE
Specific Gravity, UA: 1.015 (ref 1.005–1.030)
Urobilinogen, Ur: 0.2 mg/dL (ref 0.2–1.0)
pH, UA: 7 (ref 5.0–7.5)

## 2020-02-18 LAB — BAYER DCA HB A1C WAIVED: HB A1C (BAYER DCA - WAIVED): 7.3 % — ABNORMAL HIGH (ref <7.0)

## 2020-02-18 LAB — MICROALBUMIN, URINE WAIVED
Creatinine, Urine Waived: 100 mg/dL (ref 10–300)
Microalb, Ur Waived: 30 mg/L — ABNORMAL HIGH (ref 0–19)

## 2020-02-18 MED ORDER — TRIAMCINOLONE ACETONIDE 0.5 % EX OINT: 1.0000 "application " | TOPICAL_OINTMENT | Freq: Two times a day (BID) | CUTANEOUS | 0 refills | Status: DC

## 2020-02-18 MED ORDER — MAGNESIUM OXIDE -MG SUPPLEMENT 500 MG PO CAPS: 1.0000 | ORAL_CAPSULE | Freq: Every morning | ORAL | 2 refills | Status: DC

## 2020-02-18 MED ORDER — SUCRALFATE 1 G PO TABS: 1.0000 g | ORAL_TABLET | Freq: Three times a day (TID) | ORAL | 2 refills | Status: DC

## 2020-02-18 MED ORDER — DULOXETINE HCL 60 MG PO CPEP: 60.0000 mg | ORAL_CAPSULE | Freq: Every day | ORAL | 1 refills | Status: DC

## 2020-02-18 MED ORDER — AMLODIPINE BESYLATE 5 MG PO TABS: ORAL_TABLET | ORAL | 1 refills | Status: DC

## 2020-02-18 MED ORDER — METFORMIN HCL 500 MG PO TABS: 500.0000 mg | ORAL_TABLET | Freq: Two times a day (BID) | ORAL | 1 refills | Status: DC

## 2020-02-18 MED ORDER — VALACYCLOVIR HCL 1 G PO TABS: 1000.0000 mg | ORAL_TABLET | Freq: Two times a day (BID) | ORAL | 0 refills | Status: DC

## 2020-02-18 MED ORDER — PREGABALIN 50 MG PO CAPS: ORAL_CAPSULE | ORAL | 5 refills | Status: DC

## 2020-02-18 MED ORDER — EMPAGLIFLOZIN 10 MG PO TABS: 10.0000 mg | ORAL_TABLET | Freq: Every day | ORAL | 1 refills | Status: DC

## 2020-02-18 MED ORDER — ATORVASTATIN CALCIUM 20 MG PO TABS: 20.0000 mg | ORAL_TABLET | Freq: Every day | ORAL | 1 refills | Status: DC

## 2020-02-18 MED ORDER — HYDROCHLOROTHIAZIDE 25 MG PO TABS
25.0000 mg | ORAL_TABLET | Freq: Every day | ORAL | 1 refills | Status: DC
Start: 2020-02-18 — End: 2020-09-01

## 2020-02-18 NOTE — Progress Notes (Signed)
BP 114/83   Pulse 83   Temp 97.6 F (36.4 C) (Oral)   Ht 5\' 3"  (1.6 m)   Wt 188 lb (85.3 kg)   SpO2 98%   BMI 33.30 kg/m    Subjective:    Patient ID: , female    DOB: 1960-09-25, 59 y.o.   MRN: [id number]  HPI: Lisa Galvan is a 59 y.o. female presenting on 02/18/2020 for comprehensive medical examination. Current medical complaints include:  RASH Duration:  3 days  Location: L arm  Itching: yes Burning: yes Redness: no Oozing: yes Scaling: no Blisters: yes Painful: no Fevers: no Change in detergents/soaps/personal care products: no Recent illness: no Recent travel:no History of same: no Context: worse Alleviating factors: nothing Treatments attempted:nothing Shortness of breath: no  Throat/tongue swelling: no Myalgias/arthralgias: no  HYPERTENSION / HYPERLIPIDEMIA Satisfied with current treatment? yes Duration of hypertension: chronic BP monitoring frequency: not checking BP medication side effects: no Past BP meds: HCTZ, amlodpine Duration of hyperlipidemia: chronic Cholesterol medication side effects: no Cholesterol supplements: none Past cholesterol medications: atorvastatin Medication compliance: excellent compliance Aspirin: no Recent stressors: no Recurrent headaches: no Visual changes: no Palpitations: no Dyspnea: no Chest pain: no Lower extremity edema: no Dizzy/lightheaded: no  DIABETES Hypoglycemic episodes:no Polydipsia/polyuria: no Visual disturbance: no Chest pain: no Paresthesias: no Glucose Monitoring: no  Accucheck frequency: Not Checking Taking Insulin?: no Blood Pressure Monitoring: not checking Retinal Examination: Up to Date Foot Exam: Up to Date Diabetic Education: Completed Pneumovax: Up to Date Influenza: Up to Date Aspirin: no  Menopausal Symptoms: no  Depression Screen done today and results listed below:  Depression screen [facility] 2/9 02/18/2020 06/30/2019 03/11/2019 03/11/2018 07/03/2017  Decreased  Interest 0 0 0 0 0  Down, Depressed, Hopeless 0 0 0 0 0  PHQ - 2 Score 0 0 0 0 0  Altered sleeping 0 - - 0 0  Tired, decreased energy 0 - - 0 0  Change in appetite 0 - - 0 0  Feeling bad or failure about yourself  0 - - 0 0  Trouble concentrating 0 - - 0 0  Moving slowly or fidgety/restless 0 - - 0 0  Suicidal thoughts 0 - - 0 0  PHQ-9 Score 0 - - 0 0  Difficult doing work/chores Not difficult at all - - Not difficult at all -    Past Medical History:  Past Medical History:  Diagnosis Date  . Allergy   . Arthritis    spine  . Bilateral leg edema   . Chest pain 12/15/2014  . Chronic sciatica 12/10/2014  . Essential hypertension 12/10/2014  . Gastroesophageal reflux disease without esophagitis   . Mixed hyperlipidemia   . Obesity (BMI 30.0-34.9) 12/10/2014  . Type 2 diabetes mellitus without complication (HCC) 12/10/2014  . Umbilical hernia     Surgical History:  Past Surgical History:  Procedure Laterality Date  . ABDOMINAL HYSTERECTOMY    . COLONOSCOPY WITH PROPOFOL N/A 03/30/2019   Procedure: COLONOSCOPY WITH PROPOFOL;  Surgeon: 14/10/2018, MD;  Location: [facility] ENDOSCOPY;  Service: Gastroenterology;  Laterality: N/A;  . ESOPHAGOGASTRODUODENOSCOPY (EGD) WITH PROPOFOL N/A 03/12/2017   Procedure: ESOPHAGOGASTRODUODENOSCOPY (EGD) WITH PROPOFOL;  Surgeon: 03/14/2017, MD;  Location: [facility] ENDOSCOPY;  Service: Gastroenterology;  Laterality: N/A;  . ESOPHAGOGASTRODUODENOSCOPY (EGD) WITH PROPOFOL N/A 03/30/2019   Procedure: ESOPHAGOGASTRODUODENOSCOPY (EGD) WITH PROPOFOL;  Surgeon: 14/10/2018, MD;  Location: [facility] ENDOSCOPY;  Service: Gastroenterology;  Laterality: N/A;  . HERNIA REPAIR    .  ORIF ANKLE FRACTURE Left 07/30/2019   Procedure: OPEN REDUCTION INTERNAL FIXATION (ORIF) OF LEFT BIMALLEOLAR ANKLE FRACTURE;  Surgeon: Signa Kell, MD;  Location: [facility] SURGERY CNTR;  Service: Orthopedics;  Laterality: Left;  Diabetic - oral meds  . UMBILICAL HERNIA REPAIR N/A  05/01/2017   Procedure: HERNIA REPAIR UMBILICAL ADULT;  Surgeon: Ancil Linsey, MD;  Location: [facility] ORS;  Service: General;  Laterality: N/A;  . XI ROBOTIC ASSISTED VENTRAL HERNIA N/A 10/06/2019   Procedure: XI ROBOTIC ASSISTED VENTRAL HERNIA;  Surgeon: Leafy Ro, MD;  Location: [facility] ORS;  Service: General;  Laterality: N/A;    Medications:  Current Outpatient Medications on File Prior to Visit  Medication Sig  . acetaminophen (TYLENOL) 500 MG tablet Take 2 tablets (1,000 mg total) by mouth every 8 (eight) hours. (Patient taking differently: Take 1,000 mg by mouth every 8 (eight) hours as needed for moderate pain. )  . Cholecalciferol (VITAMIN D-3) 5000 units TABS Take 5,000 Units by mouth daily.  . Cyanocobalamin (VITAMIN B-12 PO) Take 1 tablet by mouth 2 (two) times daily.   . fexofenadine (ALLEGRA) 180 MG tablet TAKE 1 TABLET BY MOUTH EVERY DAY (Patient taking differently: Take 180 mg by mouth daily. )  . ketoconazole (NIZORAL) 2 % cream APPLY TOPICALLY TO THE AFFECTED AREA DAILY  . montelukast (SINGULAIR) 10 MG tablet Take 1 tablet (10 mg total) by mouth at bedtime.  . pantoprazole (PROTONIX) 40 MG tablet Take 40 mg by mouth 2 (two) times daily.  . valACYclovir (VALTREX) 500 MG tablet Take 500 mg by mouth 2 (two) times daily as needed (for outbreak).    No current facility-administered medications on file prior to visit.    Allergies:  Allergies  Allergen Reactions  . Amlodipine Besy-Benazepril Hcl Anaphylaxis  . Ivp Dye [Iodinated Diagnostic Agents] Anaphylaxis    Pt is unaware   . Shellfish Allergy Swelling and Anaphylaxis  . Shellfish-Derived Products Anaphylaxis  . Ace Inhibitors Swelling    Social History:  Social History   Socioeconomic History  . Marital status: Widowed    Spouse name: Not on file  . Number of children: Not on file  . Years of education: Not on file  . Highest education level: Not on file  Occupational History  . Not on file  Tobacco Use    . Smoking status: Never Smoker  . Smokeless tobacco: Never Used  Vaping Use  . Vaping Use: Never used  Substance and Sexual Activity  . Alcohol use: Yes    Alcohol/week: 3.0 standard drinks    Types: 3 Glasses of wine per week  . Drug use: No  . Sexual activity: Not Currently  Other Topics Concern  . Not on file  Social History Narrative  . Not on file   Social Determinants of Health   Financial Resource Strain:   . Difficulty of Paying Living Expenses: Not on file  Food Insecurity: No Food Insecurity  . Worried About Programme researcher, broadcasting/film/video in the Last Year: Never true  . Ran Out of Food in the Last Year: Never true  Transportation Needs: No Transportation Needs  . Lack of Transportation (Medical): No  . Lack of Transportation (Non-Medical): No  Physical Activity:   . Days of Exercise per Week: Not on file  . Minutes of Exercise per Session: Not on file  Stress: Stress Concern Present  . Feeling of Stress : Rather much  Social Connections:   . Frequency of Communication with Friends and Family: Not  on file  . Frequency of Social Gatherings with Friends and Family: Not on file  . Attends Religious Services: Not on file  . Active Member of Clubs or Organizations: Not on file  . Attends BankerClub or Organization Meetings: Not on file  . Marital Status: Not on file  Intimate Partner Violence:   . Fear of Current or Ex-Partner: Not on file  . Emotionally Abused: Not on file  . Physically Abused: Not on file  . Sexually Abused: Not on file   Social History   Tobacco Use  Smoking Status Never Smoker  Smokeless Tobacco Never Used   Social History   Substance and Sexual Activity  Alcohol Use Yes  . Alcohol/week: 3.0 standard drinks  . Types: 3 Glasses of wine per week    Family History:  Family History  Problem Relation Age of Onset  . Diabetes Mother   . Cancer Mother        breast and stomach  . Breast cancer Mother [id number]  . Dementia Mother   . Heart disease Father    . Kidney disease Father        dialysis  . Cancer Father        colon  . Diabetes Father   . Hypertension Sister   . Hypertension Brother   . Congestive Heart Failure Maternal Grandmother   . Diabetes Maternal Grandmother   . Hypertension Maternal Grandmother     Past medical history, surgical history, medications, allergies, family history and social history reviewed with patient today and changes made to appropriate areas of the chart.   Review of Systems  Constitutional: Negative.   HENT: Positive for ear pain (L occasional). Negative for congestion, ear discharge, hearing loss, nosebleeds, sinus pain, sore throat and tinnitus.   Eyes: Negative.   Respiratory: Negative.  Negative for stridor.   Cardiovascular: Negative.   Gastrointestinal: Positive for abdominal pain. Negative for blood in stool, constipation, diarrhea, heartburn, melena, nausea and vomiting.  Genitourinary: Negative.   Musculoskeletal: Negative.   Skin: Negative.   Neurological: Positive for tingling (in her fingers better with lyrica). Negative for dizziness, tremors, sensory change, speech change, focal weakness, seizures, loss of consciousness, weakness and headaches.  Endo/Heme/Allergies: Negative.   Psychiatric/Behavioral: Negative.     All other ROS negative except what is listed above and in the HPI.      Objective:    BP 114/83   Pulse 83   Temp 97.6 F (36.4 C) (Oral)   Ht 5\' 3"  (1.6 m)   Wt 188 lb (85.3 kg)   SpO2 98%   BMI 33.30 kg/m   Wt Readings from Last 3 Encounters:  02/18/20 188 lb (85.3 kg)  02/09/20 190 lb (86.2 kg)  02/03/20 190 lb (86.2 kg)    Physical Exam Vitals and nursing note reviewed.  Constitutional:      General: She is not in acute distress.    Appearance: Normal appearance. She is not ill-appearing, toxic-appearing or diaphoretic.  HENT:     Head: Normocephalic and atraumatic.     Right Ear: Tympanic membrane, ear canal and external ear normal. There is no  impacted cerumen.     Left Ear: Tympanic membrane, ear canal and external ear normal. There is no impacted cerumen.     Nose: Nose normal. No congestion or rhinorrhea.     Mouth/Throat:     Mouth: Mucous membranes are moist.     Pharynx: Oropharynx is clear. No oropharyngeal exudate or posterior oropharyngeal  erythema.  Eyes:     General: No scleral icterus.       Right eye: No discharge.        Left eye: No discharge.     Extraocular Movements: Extraocular movements intact.     Conjunctiva/sclera: Conjunctivae normal.     Pupils: Pupils are equal, round, and reactive to light.  Neck:     Vascular: No carotid bruit.  Cardiovascular:     Rate and Rhythm: Normal rate and regular rhythm.     Pulses: Normal pulses.     Heart sounds: No murmur heard.  No friction rub. No gallop.   Pulmonary:     Effort: Pulmonary effort is normal. No respiratory distress.     Breath sounds: Normal breath sounds. No stridor. No wheezing, rhonchi or rales.  Chest:     Chest wall: No tenderness.  Abdominal:     General: Abdomen is flat. Bowel sounds are normal. There is no distension.     Palpations: Abdomen is soft. There is no mass.     Tenderness: There is no abdominal tenderness. There is no right CVA tenderness, left CVA tenderness, guarding or rebound.     Hernia: A hernia (LUQ) is present.  Genitourinary:    Comments: Breast and pelvic exams deferred with shared decision making Musculoskeletal:        General: No swelling, tenderness, deformity or signs of injury.     Cervical back: Normal range of motion and neck supple. No rigidity. No muscular tenderness.     Right lower leg: No edema.     Left lower leg: No edema.  Lymphadenopathy:     Cervical: No cervical adenopathy.  Skin:    General: Skin is warm and dry.     Capillary Refill: Capillary refill takes less than 2 seconds.     Coloration: Skin is not jaundiced or pale.     Findings: Rash (vesicular rash on L ulnar side of forearm)  present. No bruising, erythema or lesion.  Neurological:     General: No focal deficit present.     Mental Status: She is alert and oriented to person, place, and time. Mental status is at baseline.     Cranial Nerves: No cranial nerve deficit.     Sensory: No sensory deficit.     Motor: No weakness.     Coordination: Coordination normal.     Gait: Gait normal.     Deep Tendon Reflexes: Reflexes normal.  Psychiatric:        Mood and Affect: Mood normal.        Behavior: Behavior normal.        Thought Content: Thought content normal.        Judgment: Judgment normal.     Results for orders placed or performed during the hospital encounter of 10/06/19  Glucose, capillary  Result Value Ref Range   Glucose-Capillary 130 (H) 70 - 99 mg/dL  Glucose, capillary  Result Value Ref Range   Glucose-Capillary 162 (H) 70 - 99 mg/dL      Assessment & Plan:   Problem List Items Addressed This Visit      Cardiovascular and Mediastinum   Essential hypertension    Under good control on current regimen. Continue current regimen. Continue to monitor. Call with any concerns. Refills given. Labs drawn today.        Relevant Medications   amLODipine (NORVASC) 5 MG tablet   atorvastatin (LIPITOR) 20 MG tablet   hydrochlorothiazide (HYDRODIURIL) 25  MG tablet   Other Relevant Orders   CBC with Differential/Platelet   Comprehensive metabolic panel   Microalbumin, Urine Waived   TSH     Endocrine   Diabetic peripheral neuropathy (HCC) (Chronic)    A1 up at 7.3- just had steroid injection. Will continue current regimen and recheck 3 months. Call with any concerns. Refills given. Lyrica stable. Refills given today.      Relevant Medications   pregabalin (LYRICA) 50 MG capsule   atorvastatin (LIPITOR) 20 MG tablet   DULoxetine (CYMBALTA) 60 MG capsule   empagliflozin (JARDIANCE) 10 MG TABS tablet   metFORMIN (GLUCOPHAGE) 500 MG tablet   Type 2 diabetes mellitus with diabetic neuropathy,  unspecified (HCC)    A1 up at 7.3- just had steroid injection. Will continue current regimen and recheck 3 months. Call with any concerns. Refills given.       Relevant Medications   atorvastatin (LIPITOR) 20 MG tablet   empagliflozin (JARDIANCE) 10 MG TABS tablet   metFORMIN (GLUCOPHAGE) 500 MG tablet   Other Relevant Orders   Bayer DCA Hb A1c Waived   CBC with Differential/Platelet   Comprehensive metabolic panel   Microalbumin, Urine Waived   Urinalysis, Routine w reflex microscopic     Other   Chronic neuropathic pain (Chronic)   Relevant Medications   pregabalin (LYRICA) 50 MG capsule   DULoxetine (CYMBALTA) 60 MG capsule   Neurogenic pain (Chronic)    Continue to follow with pain management. Stable on lyrica. Refills given. Call with any concerns.       Relevant Medications   pregabalin (LYRICA) 50 MG capsule   DULoxetine (CYMBALTA) 60 MG capsule   Morbid obesity (HCC)    Encouraged diet and exercise with goal of losing 1-2 lbs a week. Call with any concerns.       Relevant Medications   empagliflozin (JARDIANCE) 10 MG TABS tablet   metFORMIN (GLUCOPHAGE) 500 MG tablet   Other Relevant Orders   CBC with Differential/Platelet   Comprehensive metabolic panel   Hyperlipidemia    Under good control on current regimen. Continue current regimen. Continue to monitor. Call with any concerns. Refills given. Labs drawn today.       Relevant Medications   amLODipine (NORVASC) 5 MG tablet   atorvastatin (LIPITOR) 20 MG tablet   hydrochlorothiazide (HYDRODIURIL) 25 MG tablet   Other Relevant Orders   CBC with Differential/Platelet   Comprehensive metabolic panel   Lipid Panel w/o Chol/HDL Ratio   Vitamin D deficiency    Rechecking labs today. Await results. Treat as needed.       Relevant Orders   CBC with Differential/Platelet   Comprehensive metabolic panel   VITAMIN D 25 Hydroxy (Vit-D Deficiency, Fractures)   Hypomagnesemia    REchecking labs today. Await  results. Treat as needed.       Relevant Medications   Magnesium Oxide 500 MG CAPS    Other Visit Diagnoses    Routine general medical examination at a health care facility    -  Primary   Vaccines up to date. Screening labs checked today. Pap N/A. Mammogram and colonoscopy up to date. Continue diet and exercise. Call with any concerns.    Hernia of abdominal wall       Would like 2nd opinion. Referral generated today.   Relevant Orders   Ambulatory referral to General Surgery   Herpes zoster without complication       Will treat with valtrex. Call if not getting better  or getting worse.    Relevant Medications   valACYclovir (VALTREX) 1000 MG tablet   Need for immunization against influenza       Flu shot given today.   Relevant Orders   Flu Vaccine QUAD 36+ mos IM (Completed)       Follow up plan: Return in about 3 months (around 05/20/2020).   LABORATORY TESTING:  - Pap smear: not applicable  IMMUNIZATIONS:   - Tdap: Tetanus vaccination status reviewed: last tetanus booster within 10 years. - Influenza: Up to date - Pneumovax: Up to date - Prevnar: Not applicable - COVID: Up to date  SCREENING: -Mammogram: Up to date  - Colonoscopy: Up to date   PATIENT COUNSELING:   Advised to take 1 mg of folate supplement per day if capable of pregnancy.   Sexuality: Discussed sexually transmitted diseases, partner selection, use of condoms, avoidance of unintended pregnancy  and contraceptive alternatives.   Advised to avoid cigarette smoking.  I discussed with the patient that most people either abstain from alcohol or drink within safe limits (<=14/week and <=4 drinks/occasion for males, <=7/weeks and <= 3 drinks/occasion for females) and that the risk for alcohol disorders and other health effects rises proportionally with the number of drinks per week and how often a drinker exceeds daily limits.  Discussed cessation/primary prevention of drug use and availability of  treatment for abuse.   Diet: Encouraged to adjust caloric intake to maintain  or achieve ideal body weight, to reduce intake of dietary saturated fat and total fat, to limit sodium intake by avoiding high sodium foods and not adding table salt, and to maintain adequate dietary potassium and calcium preferably from fresh fruits, vegetables, and low-fat dairy products.    stressed the importance of regular exercise  Injury prevention: Discussed safety belts, safety helmets, smoke detector, smoking near bedding or upholstery.   Dental health: Discussed importance of regular tooth brushing, flossing, and dental visits.    NEXT PREVENTATIVE PHYSICAL DUE IN 1 YEAR. Return in about 3 months (around 05/20/2020).

## 2020-02-18 NOTE — Assessment & Plan Note (Signed)
Under good control on current regimen. Continue current regimen. Continue to monitor. Call with any concerns. Refills given. Labs drawn today.   

## 2020-02-18 NOTE — Patient Instructions (Addendum)
Shingrix- shingles shot  Health Maintenance for Postmenopausal Women Menopause is a normal process in which your ability to get pregnant comes to an end. This process happens slowly over many months or years, usually between the ages of 35 and 83. Menopause is complete when you have missed your menstrual periods for 12 months. It is important to talk with your health care provider about some of the most common conditions that affect women after menopause (postmenopausal women). These include heart disease, cancer, and bone loss (osteoporosis). Adopting a healthy lifestyle and getting preventive care can help to promote your health and wellness. The actions you take can also lower your chances of developing some of these common conditions. What should I know about menopause? During menopause, you may get a number of symptoms, such as:  Hot flashes. These can be moderate or severe.  Night sweats.  Decrease in sex drive.  Mood swings.  Headaches.  Tiredness.  Irritability.  Memory problems.  Insomnia. Choosing to treat or not to treat these symptoms is a decision that you make with your health care provider. Do I need hormone replacement therapy?  Hormone replacement therapy is effective in treating symptoms that are caused by menopause, such as hot flashes and night sweats.  Hormone replacement carries certain risks, especially as you become older. If you are thinking about using estrogen or estrogen with progestin, discuss the benefits and risks with your health care provider. What is my risk for heart disease and stroke? The risk of heart disease, heart attack, and stroke increases as you age. One of the causes may be a change in the body's hormones during menopause. This can affect how your body uses dietary fats, triglycerides, and cholesterol. Heart attack and stroke are medical emergencies. There are many things that you can do to help prevent heart disease and stroke. Watch your  blood pressure  High blood pressure causes heart disease and increases the risk of stroke. This is more likely to develop in people who have high blood pressure readings, are of African descent, or are overweight.  Have your blood pressure checked: ? Every 3-5 years if you are 19-73 years of age. ? Every year if you are 17 years old or older. Eat a healthy diet   Eat a diet that includes plenty of vegetables, fruits, low-fat dairy products, and lean protein.  Do not eat a lot of foods that are high in solid fats, added sugars, or sodium. Get regular exercise Get regular exercise. This is one of the most important things you can do for your health. Most adults should:  Try to exercise for at least 150 minutes each week. The exercise should increase your heart rate and make you sweat (moderate-intensity exercise).  Try to do strengthening exercises at least twice each week. Do these in addition to the moderate-intensity exercise.  Spend less time sitting. Even light physical activity can be beneficial. Other tips  Work with your health care provider to achieve or maintain a healthy weight.  Do not use any products that contain nicotine or tobacco, such as cigarettes, e-cigarettes, and chewing tobacco. If you need help quitting, ask your health care provider.  Know your numbers. Ask your health care provider to check your cholesterol and your blood sugar (glucose). Continue to have your blood tested as directed by your health care provider. Do I need screening for cancer? Depending on your health history and family history, you may need to have cancer screening at different stages  of your life. This may include screening for:  Breast cancer.  Cervical cancer.  Lung cancer.  Colorectal cancer. What is my risk for osteoporosis? After menopause, you may be at increased risk for osteoporosis. Osteoporosis is a condition in which bone destruction happens more quickly than new bone  creation. To help prevent osteoporosis or the bone fractures that can happen because of osteoporosis, you may take the following actions:  If you are 19-57 years old, get at least 1,000 mg of calcium and at least 600 mg of vitamin D per day.  If you are older than age 1 but younger than age 25, get at least 1,200 mg of calcium and at least 600 mg of vitamin D per day.  If you are older than age 32, get at least 1,200 mg of calcium and at least 800 mg of vitamin D per day. Smoking and drinking excessive alcohol increase the risk of osteoporosis. Eat foods that are rich in calcium and vitamin D, and do weight-bearing exercises several times each week as directed by your health care provider. How does menopause affect my mental health? Depression may occur at any age, but it is more common as you become older. Common symptoms of depression include:  Low or sad mood.  Changes in sleep patterns.  Changes in appetite or eating patterns.  Feeling an overall lack of motivation or enjoyment of activities that you previously enjoyed.  Frequent crying spells. Talk with your health care provider if you think that you are experiencing depression. General instructions See your health care provider for regular wellness exams and vaccines. This may include:  Scheduling regular health, dental, and eye exams.  Getting and maintaining your vaccines. These include: ? Influenza vaccine. Get this vaccine each year before the flu season begins. ? Pneumonia vaccine. ? Shingles vaccine. ? Tetanus, diphtheria, and pertussis (Tdap) booster vaccine. Your health care provider may also recommend other immunizations. Tell your health care provider if you have ever been abused or do not feel safe at home. Summary  Menopause is a normal process in which your ability to get pregnant comes to an end.  This condition causes hot flashes, night sweats, decreased interest in sex, mood swings, headaches, or lack of  sleep.  Treatment for this condition may include hormone replacement therapy.  Take actions to keep yourself healthy, including exercising regularly, eating a healthy diet, watching your weight, and checking your blood pressure and blood sugar levels.  Get screened for cancer and depression. Make sure that you are up to date with all your vaccines. This information is not intended to replace advice given to you by your health care provider. Make sure you discuss any questions you have with your health care provider. Document Revised: 04/02/2018 Document Reviewed: 04/02/2018 Elsevier Patient Education  2020 [employer].

## 2020-02-18 NOTE — Assessment & Plan Note (Signed)
A1 up at 7.3- just had steroid injection. Will continue current regimen and recheck 3 months. Call with any concerns. Refills given. Lyrica stable. Refills given today.

## 2020-02-18 NOTE — Telephone Encounter (Signed)
PA for Pregabalin initiated and submitted via Cover My Meds. Key: [id number]

## 2020-02-18 NOTE — Assessment & Plan Note (Signed)
REchecking labs today. Await results. Treat as needed.

## 2020-02-18 NOTE — Assessment & Plan Note (Signed)
A1 up at 7.3- just had steroid injection. Will continue current regimen and recheck 3 months. Call with any concerns. Refills given.

## 2020-02-18 NOTE — Assessment & Plan Note (Signed)
Continue to follow with pain management. Stable on lyrica. Refills given. Call with any concerns.

## 2020-02-18 NOTE — Assessment & Plan Note (Signed)
Rechecking labs today. Await results. Treat as needed.  °

## 2020-02-18 NOTE — Assessment & Plan Note (Signed)
Encouraged diet and exercise with goal of losing 1-2 lbs a week. Call with any concerns.  

## 2020-02-19 ENCOUNTER — Telehealth: Payer: Self-pay | Admitting: General Practice

## 2020-02-19 ENCOUNTER — Telehealth: Payer: Self-pay | Admitting: Family Medicine

## 2020-02-19 ENCOUNTER — Ambulatory Visit: Payer: Self-pay | Admitting: General Practice

## 2020-02-19 DIAGNOSIS — B029 Zoster without complications: Secondary | ICD-10-CM

## 2020-02-19 DIAGNOSIS — E785 Hyperlipidemia, unspecified: Secondary | ICD-10-CM

## 2020-02-19 DIAGNOSIS — I10 Essential (primary) hypertension: Secondary | ICD-10-CM

## 2020-02-19 DIAGNOSIS — E1140 Type 2 diabetes mellitus with diabetic neuropathy, unspecified: Secondary | ICD-10-CM

## 2020-02-19 LAB — CBC WITH DIFFERENTIAL/PLATELET
Basophils Absolute: 0 10*3/uL (ref 0.0–0.2)
Basos: 1 %
EOS (ABSOLUTE): 0.1 10*3/uL (ref 0.0–0.4)
Eos: 2 %
Hematocrit: 41.1 % (ref 34.0–46.6)
Hemoglobin: 13.4 g/dL (ref 11.1–15.9)
Immature Grans (Abs): 0 10*3/uL (ref 0.0–0.1)
Immature Granulocytes: 0 %
Lymphocytes Absolute: 2.3 10*3/uL (ref 0.7–3.1)
Lymphs: 37 %
MCH: 28.9 pg (ref 26.6–33.0)
MCHC: 32.6 g/dL (ref 31.5–35.7)
MCV: 89 fL (ref 79–97)
Monocytes Absolute: 0.5 10*3/uL (ref 0.1–0.9)
Monocytes: 9 %
Neutrophils Absolute: 3.2 10*3/uL (ref 1.4–7.0)
Neutrophils: 51 %
Platelets: 248 10*3/uL (ref 150–450)
RBC: 4.64 x10E6/uL (ref 3.77–5.28)
RDW: 16.9 % — ABNORMAL HIGH (ref 11.7–15.4)
WBC: 6.2 10*3/uL (ref 3.4–10.8)

## 2020-02-19 LAB — COMPREHENSIVE METABOLIC PANEL
ALT: 45 IU/L — ABNORMAL HIGH (ref 0–32)
AST: 63 IU/L — ABNORMAL HIGH (ref 0–40)
Albumin/Globulin Ratio: 1.9 (ref 1.2–2.2)
Albumin: 4.6 g/dL (ref 3.8–4.9)
Alkaline Phosphatase: 96 IU/L (ref 44–121)
BUN/Creatinine Ratio: 10 — ABNORMAL LOW (ref 12–28)
BUN: 8 mg/dL (ref 8–27)
Bilirubin Total: 1.2 mg/dL (ref 0.0–1.2)
CO2: 30 mmol/L — ABNORMAL HIGH (ref 20–29)
Calcium: 10.5 mg/dL — ABNORMAL HIGH (ref 8.7–10.3)
Chloride: 93 mmol/L — ABNORMAL LOW (ref 96–106)
Creatinine, Ser: 0.83 mg/dL (ref 0.57–1.00)
GFR calc Af Amer: 89 mL/min/{1.73_m2} (ref >59)
GFR calc non Af Amer: 77 mL/min/{1.73_m2} (ref >59)
Globulin, Total: 2.4 g/dL (ref 1.5–4.5)
Glucose: 104 mg/dL — ABNORMAL HIGH (ref 65–99)
Potassium: 3.4 mmol/L — ABNORMAL LOW (ref 3.5–5.2)
Sodium: 140 mmol/L (ref 134–144)
Total Protein: 7 g/dL (ref 6.0–8.5)

## 2020-02-19 LAB — LIPID PANEL W/O CHOL/HDL RATIO
Cholesterol, Total: 207 mg/dL — ABNORMAL HIGH (ref 100–199)
HDL: 122 mg/dL (ref >39)
LDL Chol Calc (NIH): 69 mg/dL (ref 0–99)
Triglycerides: 97 mg/dL (ref 0–149)
VLDL Cholesterol Cal: 16 mg/dL (ref 5–40)

## 2020-02-19 LAB — TSH: TSH: 0.968 u[IU]/mL (ref 0.450–4.500)

## 2020-02-19 LAB — VITAMIN D 25 HYDROXY (VIT D DEFICIENCY, FRACTURES): Vit D, 25-Hydroxy: 44.1 ng/mL (ref 30.0–100.0)

## 2020-02-19 NOTE — Telephone Encounter (Signed)
Patient is calling to check her request for an Accuchek meter and test strips.  She stated that the doctor told her she would request them at her last appt.  Please advise and call patient to discuss at [phone]

## 2020-02-19 NOTE — Patient Instructions (Signed)
Visit Information  Goals Addressed              This Visit's Progress     RNCM: Pt-"I haven't had anymore low blood sugars recently" (pt-stated)        CARE PLAN ENTRY (see longtitudinal plan of care for additional care plan information)  Current Barriers:   Chronic Disease Management support, education, and care coordination needs related to HTN, HLD, and DMII  Clinical Goal(s) related to HTN, HLD, and DMII:  Over the next 120 days, patient will:   Work with the care management team to address educational, disease management, and care coordination needs   Begin or continue self health monitoring activities as directed today Measure and record cbg (blood glucose) 1 times daily, Measure and record blood pressure 2/3 times per week, and adhere to a heart healthy/ADA diet  Call provider office for new or worsened signs and symptoms Blood glucose findings outside established parameters, Blood pressure findings outside established parameters, Shortness of breath, and New or worsened symptom related to to HLD, other chronic conditions, and possible sleep apnea  Call care management team with questions or concerns  Verbalize basic understanding of patient centered plan of care established today  Interventions related to HTN, HLD, and DMII:   Evaluation of current treatment plans and patient's adherence to plan as established by provider.  02-19-2020: The patient was in the office on 02-18-2020 to see Dr. Laural Benes. She has shingles on her leg and her arm. Education and and support given. The patient also with elevated hemoglobin A1C 7.3.  The patient feels it is elevated due to a steroid shot she had for back pain. She is scheduled for hernia surgery on 03-24-2020 but desires a second opinion. Dr. Laural Benes has placed a referral for [facility].  The patient states she knows she needs to have it taken care of but she wants a second opinion before she has surgery.   Assessed  patient understanding of disease states.  Has good understanding of chronic conditions and how to manage effectively.   Assessed patient's education and care coordination needs.  The patient has had both Covid vaccines. She did not have her card with her so she could not tell the exact dates, but had the Pfzier. Will bring her card in on next appointment on 01-04-2020 with Dr. Laural Benes. 02-19-2020: The patient educated on the referral in place. She also called the office today because she needs a new meter for checking her blood sugars. She is out of strips on the other meter and needs an new one that is covered by insurance.   Provided disease specific education to patient.  The patient has been told she needs to have a sleep apnea test. The patient states she has been told she snores and she takes naps a lot during the day. Will discuss this with Dr. Laural Benes at her next appointment also.   Collaborated with appropriate clinical care team members regarding patient needs. Currently working with the pharmacist and LCSW  Patient Self Care Activities related to HTN, HLD, and DMII:   Patient is unable to independently self-manage chronic health conditions  Please see past updates related to this goal by clicking on the "Past Updates" button in the selected goal         Patient verbalizes understanding of instructions provided today.   Telephone follow up appointment with care management team member scheduled for: 04-12-2020 at 12:15  Alto Denver RN, MSN, CCM Community  [facility] Mobile: [phone]

## 2020-02-19 NOTE — Chronic Care Management (AMB) (Signed)
Care Management   Follow Up Note   02/19/2020 Name: Lisa Galvan MRN: [id number] DOB: [birth year]  Referred by: Dorcas Carrow, DO Reason for referral : Care Coordination (RNCM Follow up for Chronic Disease Management and Care Coordination Needs)   Lisa Galvan is a 59 y.o. year old female who is a primary care patient of Dorcas Carrow, DO. The care management team was consulted for assistance with care management and care coordination needs.    Review of patient status, including review of consultants reports, relevant laboratory and other test results, and collaboration with appropriate care team members and the patient's provider was performed as part of comprehensive patient evaluation and provision of chronic care management services.    SDOH (Social Determinants of Health) assessments performed: Yes See Care Plan activities for detailed interventions related to [facility])     Advanced Directives: See Care Plan and Vynca application for related entries.   Goals Addressed              This Visit's Progress   .  RNCM: Pt-"I haven't had anymore low blood sugars recently" (pt-stated)        CARE PLAN ENTRY (see longtitudinal plan of care for additional care plan information)  Current Barriers:  . Chronic Disease Management support, education, and care coordination needs related to HTN, HLD, and DMII  Clinical Goal(s) related to HTN, HLD, and DMII:  Over the next 120 days, patient will:  . Work with the care management team to address educational, disease management, and care coordination needs  . Begin or continue self health monitoring activities as directed today Measure and record cbg (blood glucose) 1 times daily, Measure and record blood pressure 2/3 times per week, and adhere to a heart healthy/ADA diet . Call provider office for new or worsened signs and symptoms Blood glucose findings outside established parameters, Blood pressure findings outside established  parameters, Shortness of breath, and New or worsened symptom related to to HLD, other chronic conditions, and possible sleep apnea . Call care management team with questions or concerns . Verbalize basic understanding of patient centered plan of care established today  Interventions related to HTN, HLD, and DMII:  . Evaluation of current treatment plans and patient's adherence to plan as established by provider.  02-19-2020: The patient was in the office on 02-18-2020 to see Dr. Laural Benes. She has shingles on her leg and her arm. Education and and support given. The patient also with elevated hemoglobin A1C 7.3.  The patient feels it is elevated due to a steroid shot she had for back pain. She is scheduled for hernia surgery on 03-24-2020 but desires a second opinion. Dr. Laural Benes has placed a referral for [facility].  The patient states she knows she needs to have it taken care of but she wants a second opinion before she has surgery.  . Assessed patient understanding of disease states.  Has good understanding of chronic conditions and how to manage effectively.  . Assessed patient's education and care coordination needs.  The patient has had both Covid vaccines. She did not have her card with her so she could not tell the exact dates, but had the Pfzier. Will bring her card in on next appointment on 01-04-2020 with Dr. Laural Benes. 02-19-2020: The patient educated on the referral in place. She also called the office today because she needs a new meter for checking her blood sugars. She is out of strips on the other meter and needs an  new one that is covered by insurance.  . Provided disease specific education to patient.  The patient has been told she needs to have a sleep apnea test. The patient states she has been told she snores and she takes naps a lot during the day. Will discuss this with Dr. Laural Benes at her next appointment also.  Steele Sizer with appropriate clinical care team  members regarding patient needs. Currently working with the pharmacist and LCSW  Patient Self Care Activities related to HTN, HLD, and DMII:  . Patient is unable to independently self-manage chronic health conditions  Please see past updates related to this goal by clicking on the "Past Updates" button in the selected goal          Telephone follow up appointment with care management team member scheduled for: 04-12-2020 at 12:15 pm  Alto Denver RN, MSN, CCM Community Care Coordinator [place]  [facility] Mobile: [phone]

## 2020-02-19 NOTE — Telephone Encounter (Signed)
RX faxed to the pharmacy. Called and LVM letting her know.

## 2020-02-22 ENCOUNTER — Telehealth: Payer: Self-pay

## 2020-02-22 ENCOUNTER — Encounter: Payer: Self-pay | Admitting: Pain Medicine

## 2020-02-22 ENCOUNTER — Other Ambulatory Visit: Payer: Self-pay | Admitting: Family Medicine

## 2020-02-22 NOTE — Telephone Encounter (Signed)
Called patient to confirm appt. No answer.Left message, Instructed to call so that we could review her pp Caudal ESI.

## 2020-02-22 NOTE — Progress Notes (Signed)
Patient: Lisa Galvan  Service Category: E/M  Provider: Gaspar Cola, MD  DOB: 02/21/1961  DOS: 02/23/2020  Location: Office  MRN: [id number]  Setting: Ambulatory outpatient  Referring Provider: [name], DO  Type: Established Patient  Specialty: Interventional Pain Management  PCP: [name], DO  Location: Remote location  Delivery: TeleHealth     Virtual Encounter - Pain Management PROVIDER NOTE: Information contained herein reflects review and annotations entered in association with encounter. Interpretation of such information and data should be left to medically-trained personnel. Information provided to patient can be located elsewhere in the medical record under "Patient Instructions". Document created using STT-dictation technology, any transcriptional errors that may result from process are unintentional.    Contact & Pharmacy Preferred: [phone] Home: [phone] (home) Mobile: [phone] (mobile) E-mail: [email or web address]  [facility] [place], [address] AT [address] [place] [address] Phone: [phone] Fax: [phone]   Pre-screening  Ms. Scheper offered "in-person" vs "virtual" encounter. She indicated preferring virtual for this encounter.   Reason COVID-19*  Social distancing based on CDC and AMA recommendations.   I contacted Tniyah Nakagawa on 02/23/2020 via telephone.      I clearly identified myself as Gaspar Cola, MD. I verified that I was speaking with the correct person using two identifiers (Name: Lisa Galvan, and date of birth: 1960/08/22).  Consent I sought verbal advanced consent from Lisa Galvan for virtual visit interactions. I informed Ms. Cypert of possible security and privacy concerns, risks, and limitations associated with providing "not-in-person" medical evaluation and management services. I also informed Ms. Siebels of the availability of "in-person"  appointments. Finally, I informed her that there would be a charge for the virtual visit and that she could be  personally, fully or partially, financially responsible for it. Ms. Fedrick expressed understanding and agreed to proceed.   Historic Elements   Lisa Galvan is a 59 y.o. year old, female patient evaluated today after our last contact on 02/09/2020. Lisa Galvan  has a past medical history of Allergy, Arthritis, Bilateral leg edema, Chest pain (12/15/2014), Chronic sciatica (12/10/2014), Essential hypertension (12/10/2014), Gastroesophageal reflux disease without esophagitis, Mixed hyperlipidemia, Obesity (BMI 30.0-34.9) (12/10/2014), Type 2 diabetes mellitus without complication ([place]) (3/75/4360), and Umbilical hernia. She also  has a past surgical history that includes Abdominal hysterectomy; Esophagogastroduodenoscopy (egd) with propofol (N/A, 03/12/2017); Umbilical hernia repair (N/A, 05/01/2017); Hernia repair; Esophagogastroduodenoscopy (egd) with propofol (N/A, 03/30/2019); Colonoscopy with propofol (N/A, 03/30/2019); ORIF ankle fracture (Left, 07/30/2019); and XI robotic assisted ventral hernia (N/A, 10/06/2019). Lisa Galvan has a current medication list which includes the following prescription(s): acetaminophen, amlodipine, atorvastatin, vitamin d-3, cyanocobalamin, duloxetine, empagliflozin, fexofenadine, hydrochlorothiazide, ketoconazole, magnesium oxide, metformin, pantoprazole, pregabalin, sucralfate, triamcinolone ointment, valacyclovir, and montelukast. She  reports that she has never smoked. She has never used smokeless tobacco. She reports current alcohol use of about 3.0 standard drinks of alcohol per week. She reports that she does not use drugs. Lisa Galvan is allergic to amlodipine besy-benazepril hcl, ivp dye [iodinated diagnostic agents], shellfish allergy, shellfish-derived products, and ace inhibitors.   HPI  Today, she is being contacted for a post-procedure assessment.  Upon talking to the  patient she indicated that the area was numb after the procedure and she did not have any pain in that area for at least 2 days.  After that, then the pain started coming back, but at this point he  seems to be at least 80% better compared to how it was before.  For now, she indicates that the pain is no longer constant, and every so often she gets a little twinge in that area.  For now we will wait and see what happens.  Hopefully this will provide her with long-term benefit and she will not need anything else from [place].  However, she was reminded to give [place] a call as soon as the pain starts going back or if there is anything else that we can do for her.  Post-Procedure Evaluation  Procedure (02/09/2020): Diagnostic caudal epidural steroid injection under fluoroscopic guidance and IV sedation Pre-procedure pain level: 5/10 Post-procedure: 5/10 No relief  Sedation: Sedation provided.  Effectiveness during initial hour after procedure(Ultra-Short Term Relief): 100 %.  Local anesthetic used: Long-acting (4-6 hours) Effectiveness: Defined as any analgesic benefit obtained secondary to the administration of local anesthetics. This carries significant diagnostic value as to the etiological location, or anatomical origin, of the pain. Duration of benefit is expected to coincide with the duration of the local anesthetic used.  Effectiveness during initial 4-6 hours after procedure(Short-Term Relief): 100 %.  Long-term benefit: Defined as any relief past the pharmacologic duration of the local anesthetics.  Effectiveness past the initial 6 hours after procedure(Long-Term Relief): 100 % x 2 days.  Current benefits: Defined as benefit that persist at this time.   Analgesia:  80% better Function: Ms. Krzywicki reports improvement in function ROM: Ms. Lofton reports improvement in ROM  Pharmacotherapy Assessment  Analgesic: No opioid analgesics prescribed by our practice.   Monitoring: [place] PMP: PDMP reviewed  during this encounter.       Pharmacotherapy: No side-effects or adverse reactions reported. Compliance: No problems identified. Effectiveness: Clinically acceptable. Plan: Refer to "POC".  UDS:  Summary  Date Value Ref Range Status  03/11/2019 Note  Final    Comment:    ==================================================================== Compliance Drug Analysis, Ur ==================================================================== Test                             Result       Flag       Units Drug Present   Gabapentin                     PRESENT   Duloxetine                     PRESENT   Ibuprofen                      PRESENT ==================================================================== Test                      Result    Flag   Units      Ref Range   Creatinine              90               mg/dL      >=20 ==================================================================== Declared Medications:  Medication list was not provided. ==================================================================== For clinical consultation, please call [phone]. ====================================================================     Laboratory Chemistry Profile   Renal Lab Results  Component Value Date   BUN 8 02/18/2020   CREATININE 0.83 02/18/2020   BCR 10 (L) 02/18/2020   GFRAA 89 02/18/2020   GFRNONAA 77 02/18/2020     Hepatic Lab Results  Component Value Date  AST 63 (H) 02/18/2020   ALT 45 (H) 02/18/2020   ALBUMIN 4.6 02/18/2020   ALKPHOS 96 02/18/2020     Electrolytes Lab Results  Component Value Date   NA 140 02/18/2020   K 3.4 (L) 02/18/2020   CL 93 (L) 02/18/2020   CALCIUM 10.5 (H) 02/18/2020   MG 1.5 (L) 03/11/2019   PHOS 4.7 (H) 07/03/2017     Bone Lab Results  Component Value Date   VD25OH 44.1 02/18/2020     Inflammation (CRP: Acute Phase) (ESR: Chronic Phase) Lab Results  Component Value Date   CRP <1 02/10/2019   ESRSEDRATE 27  03/11/2019       Note: Above Lab results reviewed.  Imaging  DG PAIN CLINIC C-ARM 1-60 MIN NO REPORT Fluoro was used, but no Radiologist interpretation will be provided.  Please refer to "NOTES" tab for provider progress note.  Assessment  The primary encounter diagnosis was Coccygodynia. Diagnoses of Chronic lower extremity pain (Secondary area of Pain) (Bilateral) and Chronic low back pain (Third area of Pain) (Bilateral) w/o sciatica were also pertinent to this visit.  Plan of Care  Problem-specific:  No problem-specific Assessment & Plan notes found for this encounter.  Ms. Emelin Dascenzo has a current medication list which includes the following long-term medication(s): amlodipine, atorvastatin, duloxetine, fexofenadine, hydrochlorothiazide, magnesium oxide, metformin, pregabalin, sucralfate, and montelukast.  Pharmacotherapy (Medications Ordered): No orders of the defined types were placed in this encounter.  Orders:  No orders of the defined types were placed in this encounter.  Follow-up plan:   Return if symptoms worsen or fail to improve.      Interventional management options: Planned, scheduled, and/or pending:       Considering:   Diagnostic/therapeutic right L4-5 LESI x1 (done by Dr. Primus Bravo on 12/29/2014)   PRN Procedures:   Palliative right lumbar facet RFA #2 (last done 06/02/2019) (100/100/95) Palliative left lumbar facet RFA #2 (last done 06/30/2019) (100/100/95) Palliative bilateral lumbar facet block #3 (100/100/40/40) Palliative right sacroiliac joint block #2  (100/100/60/40) Diagnostic caudal ESI #2 (50/50/75)    Recent Visits Date Type Provider Dept  02/09/20 Procedure visit Milinda Pointer, MD [facility]  02/01/20 Office Visit Milinda Pointer, MD [facility]  Showing recent visits within past 90 days and meeting all other requirements Today's Visits Date Type Provider Dept  02/23/20 Telemedicine Milinda Pointer, MD  [facility]  Showing today's visits and meeting all other requirements Future Appointments No visits were found meeting these conditions. Showing future appointments within next 90 days and meeting all other requirements  I discussed the assessment and treatment plan with the patient. The patient was provided an opportunity to ask questions and all were answered. The patient agreed with the plan and demonstrated an understanding of the instructions.  Patient advised to call back or seek an in-person evaluation if the symptoms or condition worsens.  Duration of encounter: 13 minutes.  Note by: Gaspar Cola, MD Date: 02/23/2020; Time: 3:06 PM

## 2020-02-23 ENCOUNTER — Ambulatory Visit: Payer: BLUE CROSS/BLUE SHIELD | Attending: Pain Medicine | Admitting: Pain Medicine

## 2020-02-23 ENCOUNTER — Other Ambulatory Visit: Payer: Self-pay

## 2020-02-23 DIAGNOSIS — M79605 Pain in left leg: Secondary | ICD-10-CM | POA: Diagnosis not present

## 2020-02-23 DIAGNOSIS — G8929 Other chronic pain: Secondary | ICD-10-CM

## 2020-02-23 DIAGNOSIS — M533 Sacrococcygeal disorders, not elsewhere classified: Secondary | ICD-10-CM

## 2020-02-23 DIAGNOSIS — M79604 Pain in right leg: Secondary | ICD-10-CM | POA: Diagnosis not present

## 2020-02-23 DIAGNOSIS — M545 Low back pain, unspecified: Secondary | ICD-10-CM

## 2020-02-23 NOTE — Telephone Encounter (Signed)
Pt called saying the

## 2020-02-23 NOTE — Telephone Encounter (Signed)
PA approved. Called and LVM for patient letting her know of approval.

## 2020-02-23 NOTE — Telephone Encounter (Signed)
Pt called saying she called the pharmacy and they told her they never recd the proscription for the test strips or meter.  [facility] [place]  CB#-[phone]

## 2020-02-24 NOTE — Telephone Encounter (Signed)
Called [facility] and confirmed RX was not received. Called in prescription verbally for the patient. Called and let her know that this was done for her.

## 2020-02-27 DIAGNOSIS — G4733 Obstructive sleep apnea (adult) (pediatric): Secondary | ICD-10-CM | POA: Diagnosis not present

## 2020-03-08 ENCOUNTER — Telehealth: Payer: Self-pay | Admitting: Surgery

## 2020-03-08 NOTE — Telephone Encounter (Signed)
Incoming call from the patient.  She wants to cancel surgery for now.  Does not wish to reschedule until after the new year.  She states that with the holidays and family coming in, she wants to enjoy her family and grandchildren. At patient's request surgery is cancelled.  She is informed to call back after the first of the year so that we can get her in to see Dr. Everlene Farrier for follow up again and discuss rescheduling of her surgery.

## 2020-03-14 ENCOUNTER — Other Ambulatory Visit: Payer: BLUE CROSS/BLUE SHIELD

## 2020-03-21 ENCOUNTER — Ambulatory Visit: Payer: Self-pay | Admitting: Licensed Clinical Social Worker

## 2020-03-21 NOTE — Chronic Care Management (AMB) (Signed)
Care Management   Follow Up Note   03/21/2020 Name: Lisa Galvan MRN: [id number] DOB: 1961-04-16  Lisa Galvan is enrolled in a Managed Medicaid plan: Yes. Outreach attempt today was successful.    Referred by: Dorcas Carrow, DO Reason for referral : Care Coordination   Lisa Galvan is a 59 y.o. year old female who is a primary care patient of Dorcas Carrow, DO. The care management team was consulted for assistance with care management and care coordination needs.    Review of patient status, including review of consultants reports, relevant laboratory and other test results, and collaboration with appropriate care team members and the patient's provider was performed as part of comprehensive patient evaluation and provision of chronic care management services.    Patient Care Plan: General Social Work (Adult)    Problem Identified: I'm managing my physical and mental health better now   Priority: Medium    Goal: Depressive Symptoms Identified   Start Date: 03/21/2020  Priority: Medium  Note:   Evidence-based guidance:   Identify risk for depression by reviewing presenting symptoms and risk factors.   Review use of medications that contribute to depression such as steroid, narcotic, sedative, antihypertensive, beta blocker, cytoxic agent.   Review related metabolic processes, including infection, anemia, thyroid dysfunction, kidney failure, heart failure, alcohol or substance use.   Perform depression screening using standardized tools to obtain baseline intensity of depressive symptoms.   Perform or refer for a full diagnostic interview when positive screening results are noted; use DSM-5 criteria to determine appropriate diagnosis (e.g., major depression, persistent depressive disorder, unspecified depressive    disorder).   Notes:    Current Barriers:  . Limited social support . Level of care concerns . ADL IADL limitations . Social Isolation . Limited access  to caregiver  Clinical Social Work Clinical Goal(s):  Lisa Galvan Kitchen Over the next 120 days, patient will work with SW to address concerns related to gaining additional support/resource connection in order to maintain health  Interventions: . Inter-disciplinary care team collaboration (see longitudinal plan of care) . Patient interviewed and appropriate assessments performed . Provided patient with information about available personal care service resources within [facility] ([facility], Private Pay, [facility] and Day Programs). Patient reports that she is not eligible for Medicaid and therefore would be unable to get PCS. Patient states that she may have to pay out of pocket for personal care. . Discussed plans with patient for ongoing care management follow up and provided patient with direct contact information for care management team . Advised patient to contact CCM LCSW if future social work needs arise. . Assisted patient/caregiver with obtaining information about health plan benefits . Provided education and assistance to client regarding Advanced Directives. . Patient reports ongoing cramping and tingling with her toes and fingers. She reports that her toes will sometimes turn purple. She is going to discuss this issue with PCP during office visit this month. . Patient reports that her daughter stayed with her for 3 months but relocated back to a different state. Patient reports doing well with this adjustment. Family is hopeful that daughter can relocate back to  soon in order to provide caregiver to patient. Patient shares that her neighbors are moving and she is interested in her daughter buying this residence.  . Patient reports that she has been receiving ongoing socialization through church. However, she reports that last Sunday she attended a social event after church and became too hot and had to leave. LCSW  provided education on appropriate and safe health care which includes learning her  limitations. Patient admits that she did not eat enough before she left on Sunday which could be why she felt so weak.  . Stress and depression related to current circumstances with ankle repair. Patient reports that she is having difficulty accepting her current health state. . Patient reports that she is using her car for transportation instead of her jeep because she is unable to get in and out of it due to its' height.  . Patient is currently in [place] visiting family but has her return flight back to [place] this afternoon. She shares that she had a minor fall/trip while she was visiting with family but it is healing well. She was very excited that she was able to visit her grandchildren and go with them to meet [place].  Patient Self Care Activities:  . Calls provider office for new concerns or questions . Lacks social connections  Please see past updates related to this goal by clicking on the "Past Updates" button in the selected goal    Task: Identify Depressive Symptoms and Facilitate Treatment   Note:   Care Management Activities:    - DSM-5 clinical interview performed    Notes:       The care management team will reach out to the patient again over the next 90 days.   Dickie La, BSW, MSW, LCSW [facility]  [facility] [email or web address] Phone: [phone]

## 2020-03-22 ENCOUNTER — Other Ambulatory Visit: Payer: Self-pay | Admitting: Family Medicine

## 2020-03-22 ENCOUNTER — Other Ambulatory Visit: Payer: BLUE CROSS/BLUE SHIELD

## 2020-03-22 NOTE — Telephone Encounter (Signed)
Patient last seen 02/18/20 and has appointment 05/23/20

## 2020-03-24 ENCOUNTER — Inpatient Hospital Stay: Admit: 2020-03-24 | Payer: BLUE CROSS/BLUE SHIELD | Admitting: Surgery

## 2020-03-24 SURGERY — REPAIR, HERNIA, INCISIONAL
Anesthesia: General | Laterality: Left

## 2020-03-25 ENCOUNTER — Ambulatory Visit: Payer: Self-pay | Admitting: Surgery

## 2020-03-25 DIAGNOSIS — K439 Ventral hernia without obstruction or gangrene: Secondary | ICD-10-CM | POA: Diagnosis not present

## 2020-03-25 NOTE — H&P (Signed)
History of Present Illness Lisa Galvan. Lisa Wisniewski MD; 03/25/2020 12:28 PM) The patient is a 59 year old female who presents with an incisional hernia. PCP - Park Liter, DO This is a 59 year old female who presents for second opinion for left lateral ventral incisional hernia. In January 2019, the patient underwent primary repair of an umbilical hernia by Dr. Tama High performed [facility]. A small ventral ex mesh was used. Earlier this year, she had a recurrence just above her umbilicus. She underwent a robotic mesh repair by Dr. Caroleen Hamman. This was performed on 10/06/19. According to the note, this was repaired with a round 11.4 cm ventral light mesh. According to his note, the port sites were closed with fascial sutures. However, the patient has developed a fairly large protruding hernia in her left upper quadrant at the site of the camera port. Recent CT scan showed a left lateral abdominal hernia containing a loop of distal transverse colon but no sign of obstruction. The patient presents now for second opinion for repair.  CLINICAL DATA: Left-sided abdominal pain and palpable abnormality for several months. Previous ventral abdominal wall hernia repair.  EXAM: CT ABDOMEN AND PELVIS WITHOUT CONTRAST  TECHNIQUE: Multidetector CT imaging of the abdomen and pelvis was performed following the standard protocol without IV contrast.  COMPARISON: 09/14/2019  FINDINGS: Lower chest: No acute findings.  Hepatobiliary: No mass visualized on this unenhanced exam. Mild-to-moderate diffuse hepatic steatosis is noted. Tiny calcified gallstones are again seen, however there is no evidence of cholecystitis or biliary ductal dilatation.  Pancreas: No mass or inflammatory process visualized on this unenhanced exam.  Spleen: Within normal limits in size.  Adrenals/Urinary tract: No evidence of urolithiasis or hydronephrosis. Unremarkable unopacified urinary bladder.  Stomach/Bowel: No evidence  of obstruction, inflammatory process, or abnormal fluid collections. A new small hernia is seen in the left lateral abdominal wall which contains a loop of distal transverse colon. No evidence of bowel obstruction or strangulation. There has been interval surgical repair of a previously seen paraumbilical hernia, and no recurrent hernia is seen at this site.  Vascular/Lymphatic: No pathologically enlarged lymph nodes identified. No evidence of abdominal aortic aneurysm. Mild Aortic atherosclerotic calcification noted.  Reproductive: Prior supracervical hysterectomy again noted. A calcified broad ligament fibroid is again seen in the left adnexa measuring 3.2 cm, and is unchanged. No other masses, inflammatory process, or abnormal fluid collections identified.  Other: None.  Musculoskeletal: No suspicious bone lesions identified.  IMPRESSION: New small left lateral abdominal wall hernia containing a loop of distal transverse colon. No evidence of bowel obstruction or strangulation.  Stable 3.2 cm calcified broad ligament fibroid in left adnexa.  Hepatic steatosis.  Cholelithiasis. No radiographic evidence of cholecystitis.  Aortic Atherosclerosis (ICD10-I70.0).   Electronically Signed By: Marlaine Hind M.D. On: 02/04/2020 09:17   Problem List/Past Medical Rodman Key K. Devlynn Knoff, MD; 03/25/2020 12:28 PM) HERNIA, LATERAL VENTRAL (K43.9)  Past Surgical History Mammie Lorenzo, LPN; 16/04/958 4:54 AM) Hysterectomy (not due to cancer) - Partial Ventral / Umbilical Hernia Surgery multiple  Diagnostic Studies History Mammie Lorenzo, LPN; 12/29/1189 4:78 AM) Mammogram within last year Pap Smear 1-5 years ago  Allergies Mammie Lorenzo, LPN; 29/08/6211 0:86 AM) Iodinated Contrast Media Anaphylaxis. Shellfish Anaphylaxis. Allergies Reconciled Benazepril HCl *ANTIHYPERTENSIVES*  Medication History Mammie Lorenzo, LPN; 57/11/4694 29:52 AM) Acetaminophen (500MG Tablet,  Oral) Active. Atorvastatin Calcium (20MG Tablet, Oral) Active. Norvasc (5MG Tablet, Oral) Active. DULoxetine HCl (60MG Capsule DR Part, Oral) Active. Empagliflozin (10MG Tablet, Oral) Active. Fexofenadine HCl (180MG Tablet,  Oral) Active. hydroCHLOROthiazide (25MG Tablet, Oral) Active. Ketoconazole (2% Cream, External) Active. Magnesium (500MG Tablet, Oral) Active. metFORMIN HCl (500MG Tablet, Oral) Active. Montelukast Sodium (10MG Tablet, Oral) Active. Pantoprazole Sodium (40MG Packet, Oral) Active. Pregabalin (50MG Capsule, Oral) Active. Sucralfate (1GM Tablet, Oral) Active. Triamcinolone & Emollient (0.1% Kit, External) Active. Valtrex (500MG Tablet, Oral) Active. Vitamin B-12 (50MCG Tablet, Oral) Active. Vitamin D3 (125 MCG(5000 UT) Capsule, Oral) Active. Medications Reconciled  Social History Mammie Lorenzo, LPN; 64/09/8030 1:22 AM) Alcohol use Moderate alcohol use. Caffeine use Carbonated beverages. No drug use Tobacco use Never smoker.  Family History Mammie Lorenzo, LPN; 48/05/5001 7:04 AM) Arthritis Mother. Breast Cancer Mother. Colon Cancer Father. Diabetes Mellitus Brother, Father, Mother. Heart disease in female family member before age 107 Hypertension Brother, Daughter, Father, Mother, Sister.  Pregnancy / Birth History Mammie Lorenzo, LPN; [birth year]/11/9167 4:50 AM) Age of menopause <43 Gravida 3 Maternal age 38-20 Para 2  Other Problems Lisa Galvan. Leveta Wahab, MD; 03/25/2020 12:28 PM) Back Pain Diabetes Mellitus Gastroesophageal Reflux Disease Hypercholesterolemia Umbilical Hernia Repair     Review of Systems Claiborne Billings [facility] LPN; 38/11/8278 0:34 AM) General Present- Weight Gain. Not Present- Appetite Loss, Chills, Fatigue, Fever, Night Sweats and Weight Loss. Skin Not Present- Change in Wart/Mole, Dryness, Hives, Jaundice, New Lesions, Non-Healing Wounds, Rash and Ulcer. HEENT Present- Wears glasses/contact lenses. Not Present-  Earache, Hearing Loss, Hoarseness, Nose Bleed, Oral Ulcers, Ringing in the Ears, Seasonal Allergies, Sinus Pain, Sore Throat, Visual Disturbances and Yellow Eyes. Respiratory Not Present- Bloody sputum, Chronic Cough, Difficulty Breathing, Snoring and Wheezing. Breast Not Present- Breast Mass, Breast Pain, Nipple Discharge and Skin Changes. Cardiovascular Not Present- Chest Pain, Difficulty Breathing Lying Down, Leg Cramps, Palpitations, Rapid Heart Rate, Shortness of Breath and Swelling of Extremities. Gastrointestinal Present- Change in Bowel Habits. Not Present- Abdominal Pain, Bloating, Bloody Stool, Chronic diarrhea, Constipation, Difficulty Swallowing, Excessive gas, Gets full quickly at meals, Hemorrhoids, Indigestion, Nausea, Rectal Pain and Vomiting. Female Genitourinary Not Present- Frequency, Nocturia, Painful Urination, Pelvic Pain and Urgency. Musculoskeletal Not Present- Back Pain, Joint Pain, Joint Stiffness, Muscle Pain, Muscle Weakness and Swelling of Extremities. Neurological Not Present- Decreased Memory, Fainting, Headaches, Numbness, Seizures, Tingling, Tremor, Trouble walking and Weakness. Psychiatric Not Present- Anxiety, Bipolar, Change in Sleep Pattern, Depression, Fearful and Frequent crying. Endocrine Not Present- Cold Intolerance, Excessive Hunger, Hair Changes, Heat Intolerance, Hot flashes and New Diabetes. Hematology Not Present- Blood Thinners, Easy Bruising, Excessive bleeding, Gland problems, HIV and Persistent Infections.  Vitals Claiborne Billings [place]ery LPN; 91/10/9148 5:69 AM) 03/25/2020 9:53 AM Weight: 192.2 lb Height: 63in Body Surface Area: 1.9 m Body Mass Index: 34.05 kg/m  Temp.: 97.27F(Infrared)  Pulse: 112 (Regular)  BP: 122/74(Sitting, Left Arm, Standard)        Physical Exam Rodman Key K. Ahliya Glatt MD; 03/25/2020 12:28 PM)  The physical exam findings are as follows: Note:Constitutional: WDWN in NAD, conversant, no obvious deformities; resting  comfortably Eyes: Pupils equal, round; sclera anicteric; moist conjunctiva; no lid lag HENT: Oral mucosa moist; good dentition Neck: No masses palpated, trachea midline; no thyromegaly Lungs: CTA bilaterally; normal respiratory effort CV: Regular rate and rhythm; no murmurs; extremities well-perfused with no edema Abd: +bowel sounds, soft, non-tender, no palpable organomegaly; healed umbilical incision with no sign of recurrent hernia Protruding left lateral hernia - reducible; healed surgical incision. Musc: Normal gait; no apparent clubbing or cyanosis in extremities Lymphatic: No palpable cervical or axillary lymphadenopathy Skin: Warm, dry; no sign of jaundice Psychiatric - alert and oriented x 4; calm mood and affect  Assessment & Plan Lisa Galvan. Rawson Minix MD; 03/25/2020 12:29 PM)  HERNIA, LATERAL VENTRAL (K43.9)  Current Plans Schedule for Surgery - Open ventral hernia repair with mesh. The surgical procedure has been discussed with the patient. Potential risks, benefits, alternative treatments, and expected outcomes have been explained. All of the patient's questions at this time have been answered. The likelihood of reaching the patient's treatment goal is good. The patient understand the proposed surgical procedure and wishes to proceed. Note:The patient wants to decide later whether she wants to have her surgery performed here or by the primary surgeon. She will call and let us know she wants to proceed.  Lisa Galvan. Georgette Dover, MD, [facility] Surgery  General/ Trauma Surgery   03/25/2020 12:29 PM

## 2020-04-12 ENCOUNTER — Telehealth: Payer: Self-pay | Admitting: General Practice

## 2020-04-12 ENCOUNTER — Telehealth: Payer: Self-pay

## 2020-04-12 NOTE — Telephone Encounter (Signed)
°  Chronic Care Management   Outreach Note  04/12/2020 Name: Lisa Galvan MRN: [id number] DOB: [birth year]  Referred by: Dorcas Carrow, DO Reason for referral : Appointment (RNCM: Care Coordination Attempt for Chronic Disease Management and care coordination needs)   Lisa Galvan is enrolled in a Managed Medicaid Health Plan: No  An unsuccessful telephone outreach was attempted today. The patient was referred to the case management team for assistance with care management and care coordination.   Follow Up Plan: A HIPAA compliant phone message was left for the patient providing contact information and requesting a return call.   Alto Denver RN, MSN, CCM Community Care Coordinator [place]   [facility] Mobile: [phone]

## 2020-04-29 ENCOUNTER — Ambulatory Visit: Payer: Self-pay

## 2020-05-05 NOTE — Telephone Encounter (Signed)
Pt has been rescheduled. 

## 2020-05-10 ENCOUNTER — Telehealth: Payer: Self-pay

## 2020-05-10 NOTE — Telephone Encounter (Signed)
Copied from CRM [id number]. Topic: General - Inquiry >> May 10, 2020  3:16 PM Leafy Ro wrote: Reason for CRM: Pt is requesting Pam to return her call . Pt said she only call when she wants something. Pt did not elaborate want she would like from pam

## 2020-05-11 ENCOUNTER — Ambulatory Visit: Payer: Self-pay | Admitting: General Practice

## 2020-05-11 DIAGNOSIS — E785 Hyperlipidemia, unspecified: Secondary | ICD-10-CM

## 2020-05-11 DIAGNOSIS — I10 Essential (primary) hypertension: Secondary | ICD-10-CM

## 2020-05-11 DIAGNOSIS — E1140 Type 2 diabetes mellitus with diabetic neuropathy, unspecified: Secondary | ICD-10-CM

## 2020-05-11 NOTE — Patient Instructions (Signed)
Visit Information  Patient Care Plan: General Social Work (Adult)    Problem Identified: I'm managing my physical and mental health better now   Priority: Medium    Goal: Depressive Symptoms Identified   Start Date: 03/21/2020  Priority: Medium  Note:   Evidence-based guidance:   Identify risk for depression by reviewing presenting symptoms and risk factors.   Review use of medications that contribute to depression such as steroid, narcotic, sedative, antihypertensive, beta blocker, cytoxic agent.   Review related metabolic processes, including infection, anemia, thyroid dysfunction, kidney failure, heart failure, alcohol or substance use.   Perform depression screening using standardized tools to obtain baseline intensity of depressive symptoms.   Perform or refer for a full diagnostic interview when positive screening results are noted; use DSM-5 criteria to determine appropriate diagnosis (e.g., major depression, persistent depressive disorder, unspecified depressive    disorder).   Notes:    Current Barriers:  . Limited social support . Level of care concerns . ADL IADL limitations . Social Isolation . Limited access to caregiver  Clinical Social Work Clinical Goal(s):  Marland Kitchen Over the next 120 days, patient will work with SW to address concerns related to gaining additional support/resource connection in order to maintain health  Interventions: . Inter-disciplinary care team collaboration (see longitudinal plan of care) . Patient interviewed and appropriate assessments performed . Provided patient with information about available personal care service resources within [facility] ([facility], Private Pay, [facility] and Day Programs). Patient reports that she is not eligible for Medicaid and therefore would be unable to get PCS. Patient states that she may have to pay out of pocket for personal care. . Discussed plans with patient for ongoing care management follow up and provided  patient with direct contact information for care management team . Advised patient to contact CCM LCSW if future social work needs arise. . Assisted patient/caregiver with obtaining information about health plan benefits . Provided education and assistance to client regarding Advanced Directives. . Patient reports ongoing cramping and tingling with her toes and fingers. She reports that her toes will sometimes turn purple. She is going to discuss this issue with PCP during office visit this month. . Patient reports that her daughter stayed with her for 3 months but relocated back to a different state. Patient reports doing well with this adjustment. Family is hopeful that daughter can relocate back to [place] soon in order to provide caregiver to patient. Patient shares that her neighbors are moving and she is interested in her daughter buying this residence.  . Patient reports that she has been receiving ongoing socialization through church. However, she reports that last Sunday she attended a social event after church and became too hot and had to leave. LCSW provided education on appropriate and safe health care which includes learning her limitations. Patient admits that she did not eat enough before she left on Sunday which could be why she felt so weak.  . Stress and depression related to current circumstances with ankle repair. Patient reports that she is having difficulty accepting her current health state. . Patient reports that she is using her car for transportation instead of her jeep because she is unable to get in and out of it due to its' height.  . Patient is currently in [place] visiting family but has her return flight back to  this afternoon. She shares that she had a minor fall/trip while she was visiting with family but it is healing well. She was very  excited that she was able to visit her grandchildren and go with them to meet [place].  Patient Self Care Activities:  . Calls provider  office for new concerns or questions . Lacks social connections  Please see past updates related to this goal by clicking on the "Past Updates" button in the selected goal    Task: Identify Depressive Symptoms and Facilitate Treatment   Note:   Care Management Activities:    - DSM-5 clinical interview performed    Notes:    Patient Care Plan: RNCM: Diabetes Type 2 (Adult)    Problem Identified: RNCM: Glycemic Management (Diabetes, Type 2)   Priority: Medium    Goal: RNCM: Glycemic Management Optimized   Priority: Medium  Note:   Objective:  Lab Results  Component Value Date   HGBA1C 7.3 (H) 02/18/2020 .   Lab Results  Component Value Date   CREATININE 0.83 02/18/2020   CREATININE 0.77 09/28/2019   CREATININE 0.88 05/18/2019 .   Marland Kitchen No results found for: EGFR Current Barriers:  Marland Kitchen Knowledge Deficits related to basic Diabetes pathophysiology and self care/management . Knowledge Deficits related to medications used for management of diabetes . Film/video editor . Limited Social Support . Unable to independently manage DM . Lacks social connections . Does not maintain contact with provider office . Does not contact provider office for questions/concerns Case Manager Clinical Goal(s):  Marland Kitchen Collaboration with [name], DO regarding development and update of comprehensive plan of care as evidenced by provider attestation and co-signature . Inter-disciplinary care team collaboration (see longitudinal plan of care) . Over the next 120 days, patient will demonstrate improved adherence to prescribed treatment plan for diabetes self care/management as evidenced by:  . daily monitoring and recording of CBG  . adherence to ADA/ carb modified diet . adherence to prescribed medication regimen Interventions:  . Provided education to patient about basic DM disease process . Reviewed medications with patient and discussed importance of medication adherence . Discussed plans  with patient for ongoing care management follow up and provided patient with direct contact information for care management team . Provided patient with written educational materials related to hypo and hyperglycemia and importance of correct treatment . Reviewed scheduled/upcoming provider appointments including: 05-23-2020 at 220 pm . Advised patient, providing education and rationale, to check cbg BID and record, calling pcp for findings outside established parameters.   . Referral made to pharmacy team for assistance with help with letter she received about "MIV" and needing documentation that she is not on insulin and other diagnosis information.  The patient to send e-mail with information to the [place] will forward to [facility] team pharmacist.  . Collaboration with the pcp and CCM pharmacist concerning needing documentation that she needs a letter stating she does not have multiple heart conditions and she is not insulin dependent. Will send in basket message to the pcp and pharmacist for assistance. Did advise the patient that she could copy her last office visit note off of my Chart that had her diagnosis and medications listed.  . Review of patient status, including review of consultants reports, relevant laboratory and other test results, and medications completed. Patient Goals/Self-Care Activities . Over the next 120 days, patient will:  - UNABLE to independently manage DM Attends all scheduled provider appointments Checks blood sugars as prescribed and utilize hyper and hypoglycemia protocol as needed Adheres to prescribed ADA/carb modified - barriers to adherence to treatment plan identified - blood glucose monitoring encouraged -  blood glucose readings reviewed - individualized medical nutrition therapy provided - mutual A1C goal set or reviewed - resources required to improve adherence to care identified - self-awareness of signs/symptoms of hypo or hyperglycemia  encouraged - use of blood glucose monitoring log promoted Follow Up Plan: Telephone follow up appointment with care management team member scheduled for: 06-07-2020 at 0945 am   Task: RNCM: Alleviate Barriers to Glycemic Management   Note:   Care Management Activities:    - barriers to adherence to treatment plan identified - blood glucose monitoring encouraged - blood glucose readings reviewed - individualized medical nutrition therapy provided - mutual A1C goal set or reviewed - resources required to improve adherence to care identified - self-awareness of signs/symptoms of hypo or hyperglycemia encouraged - use of blood glucose monitoring log promoted       Patient Care Plan: RNCM: Hypertension (Adult)    Problem Identified: RNCM: Hypertension (Hypertension)   Priority: Medium    Goal: RNCM: Hypertension Monitored   Priority: Medium  Note:   Objective:  . Last practice recorded BP readings:  BP Readings from Last 3 Encounters:  02/18/20 114/83  02/09/20 (!) 133/99  02/03/20 114/76 .   Marland Kitchen Most recent eGFR/CrCl: No results found for: EGFR  No components found for: CRCL Current Barriers:  Marland Kitchen Knowledge Deficits related to basic understanding of hypertension pathophysiology and self care management . Knowledge Deficits related to understanding of medications prescribed for management of hypertension . Limited Social Support . Film/video editor.  . Unable to independently manage HTN . Lacks social connections . Does not maintain contact with provider office . Does not contact provider office for questions/concerns Case Manager Clinical Goal(s):  Marland Kitchen Over the next 120 days, patient will verbalize understanding of plan for hypertension management . Over the next 120 days, patient will attend all scheduled medical appointments: 05-23-2020 at 2:20 pm . Over the next 120 days, patient will demonstrate improved adherence to prescribed treatment plan for hypertension as evidenced  by taking all medications as prescribed, monitoring and recording blood pressure as directed, adhering to low sodium/DASH diet . Over the next 120 days, patient will demonstrate improved health management independence as evidenced by checking blood pressure as directed and notifying PCP if SBP>160 or DBP > 90, taking all medications as prescribe, and adhering to a low sodium diet as discussed. . Over the next 120 days, patient will verbalize basic understanding of hypertension disease process and self health management plan as evidenced by compliance with the plan of care, taking medications as ordered, and working with the CCM team to manage health and well being Interventions:  . Collaboration with [name], DO regarding development and update of comprehensive plan of care as evidenced by provider attestation and co-signature . Inter-disciplinary care team collaboration (see longitudinal plan of care) . UNABLE to independently:manage HTN . Evaluation of current treatment plan related to hypertension self management and patient's adherence to plan as established by provider. . Provided education to patient re: stroke prevention, s/s of heart attack and stroke, DASH diet, complications of uncontrolled blood pressure . Reviewed medications with patient and discussed importance of compliance . Discussed plans with patient for ongoing care management follow up and provided patient with direct contact information for care management team . Advised patient, providing education and rationale, to monitor blood pressure daily and record, calling PCP for findings outside established parameters.  . Reviewed scheduled/upcoming provider appointments including: 05-23-2020 at 2:20 pm Patient Goals/Self-Care Activities . Over the  next 120 days, patient will:  - UNABLE to independently manage HTN Calls provider office for new concerns, questions, or BP outside discussed parameters Checks BP and records as  discussed Follows a low sodium diet/DASH diet - blood pressure trends reviewed - depression screen reviewed - home or ambulatory blood pressure monitoring encouraged Follow Up Plan: Telephone follow up appointment with care management team member scheduled for: 05-23-2020 at 2:20 pm   Task: RNCM: Identify and Monitor Blood Pressure Elevation   Note:   Care Management Activities:    - blood pressure trends reviewed - depression screen reviewed - home or ambulatory blood pressure monitoring encouraged       Patient Care Plan: RNCM: Management of HLD    Problem Identified: RNCM: Management of HLD   Priority: Medium    Goal: RNCM: Managment of HLD   Priority: Medium  Note:   Current Barriers:  . Poorly controlled hyperlipidemia, complicated by HTN, DM . Current antihyperlipidemic regimen: Lipitor 20 mg QD . Most recent lipid panel:     Component Value Date/Time   CHOL 207 (H) 02/18/2020 1314   TRIG 97 02/18/2020 1314   HDL 122 02/18/2020 1314   CHOLHDL 1.4 01/03/2017 1139   LDLCALC 69 02/18/2020 1314 .   Marland Kitchen ASCVD risk enhancing conditions: age 89 DM, HTN . Unable to independently manage HLD . Lacks social connections . Does not maintain contact with provider office . Does not contact provider office for questions/concerns  RN Care Manager Clinical Goal(s):  Marland Kitchen Over the next 120 days, patient will work with Consulting civil engineer, providers, and care team towards execution of optimized self-health management plan . Over the next 120 days, patient will verbalize understanding of plan for working with the CCM team to meet health and wellness needs . Over the next 120 days, patient will work with [facility], pharmacist and pcp to address needs related to expressed needs for paperwork with documentation about chronic conditions and medicaitons  . Over the next 120 days, patient will attend all scheduled medical appointments: 05-23-2020 at 220 pm . Over the next 30 days, patient will work with  CM team pharmacist to get needed documentation expressed in the call today.   Interventions: . Collaboration with [name], DO regarding development and update of comprehensive plan of care as evidenced by provider attestation and co-signature . Inter-disciplinary care team collaboration (see longitudinal plan of care) . Medication review performed; medication list updated in electronic medical record.  Bertram Savin care team collaboration (see longitudinal plan of care) . Referred to pharmacy team for assistance with HLD medication management . Evaluation of current treatment plan related to HLD and patient's adherence to plan as established by provider. . Advised patient to send letter to the [facility] email address for review and forwarding to the pharmacist . Provided education to patient re: printing off last office visit note from 02-17-2021 with pcp as supporting documentation to the expressed needs during the call . Reviewed medications with patient and discussed compliance  . Collaborated with pcp and pharmacist  regarding needs expressed for documentation needed for insurance purposes  . Discussed plans with patient for ongoing care management follow up and provided patient with direct contact information for care management team . Reviewed scheduled/upcoming provider appointments including: 05-23-2020 at 220 pm . Pharmacy referral for assistance with expressed paperwork for insurance purposes    Patient Goals/Self-Care Activities: . Over the next 120 days, patient will:   - call for medicine refill 2  or 3 days before it runs out - call if I am sick and can't take my medicine - keep a list of all the medicines I take; vitamins and herbals too - learn to read medicine labels - use a pillbox to sort medicine - use an alarm clock or phone to remind me to take my medicine - change to whole grain breads, cereal, pasta - drink 6 to 8 glasses of water each day - eat 3 to 5  servings of fruits and vegetables each day - fill half the plate with nonstarchy vegetables - limit fast food meals to no more than 1 per week - manage portion size - prepare main meal at home 3 to 5 days each week - read food labels for fat, fiber, carbohydrates and portion size - be open to making changes - I can manage, know and watch for signs of a heart attack - if I have chest pain, call for help - learn about small changes that will make a big difference - learn my personal risk factors  - barriers to medication adherence identified - medication list compiled - medication list reviewed - medication-adherence assessment completed - self-management plan initiated or updated - understanding of current medications assessed  Follow Up Plan: Telephone follow up appointment with care management team member scheduled for:06-07-2020 at 0945 am     Task: RNCM: management of HLD   Note:   Care Management Activities:    - barriers to medication adherence identified - medication list compiled - medication list reviewed - medication-adherence assessment completed - self-management plan initiated or updated - understanding of current medications assessed         Patient verbalizes understanding of instructions provided today.  Telephone follow up appointment with care management team member scheduled for: 06-07-2020 at [place] am  [name] RN, MSN, [facility] Mobile: [phone]

## 2020-05-11 NOTE — Chronic Care Management (AMB) (Signed)
Care Management    RN Visit Note  05/11/2020 Name: Lisa Galvan MRN: [id number] DOB: July 20, 1960  Subjective: Lisa Galvan is a 60 y.o. year old female who is a primary care patient of [name], DO. The care management team was consulted for assistance with disease management and care coordination needs.    Engaged with patient by telephone for follow up visit in response to provider referral for case management and/or care coordination services.   Consent to Services:   Lisa Galvan was given information about Care Management services today including:  1. Care Management services includes personalized support from designated clinical staff supervised by her physician, including individualized plan of care and coordination with other care providers 2. 24/7 contact phone numbers for assistance for urgent and routine care needs. 3. The patient may stop case management services at any time by phone call to the office staff.  Patient agreed to services and consent obtained.   Assessment: Review of patient past medical history, allergies, medications, health status, including review of consultants reports, laboratory and other test data, was performed as part of comprehensive evaluation and provision of chronic care management services.   SDOH (Social Determinants of Health) assessments and interventions performed:    Care Plan  Allergies  Allergen Reactions   Amlodipine Besy-Benazepril Hcl Anaphylaxis   Ivp Dye [Iodinated Diagnostic Agents] Anaphylaxis    Pt is unaware    Shellfish Allergy Swelling and Anaphylaxis   Shellfish-Derived Products Anaphylaxis   Ace Inhibitors Swelling    Outpatient Encounter Medications as of 05/11/2020  Medication Sig   acetaminophen (TYLENOL) 500 MG tablet Take 2 tablets (1,000 mg total) by mouth every 8 (eight) hours. (Patient taking differently: Take 1,000 mg by mouth every 8 (eight) hours as needed for moderate pain. )   ALLERGY RELIEF 180  MG tablet TAKE 1 TABLET BY MOUTH EVERY DAY   amLODipine (NORVASC) 5 MG tablet TAKE 1 TABLET(5 MG) BY MOUTH DAILY   atorvastatin (LIPITOR) 20 MG tablet Take 1 tablet (20 mg total) by mouth daily.   Cholecalciferol (VITAMIN D-3) 5000 units TABS Take 5,000 Units by mouth daily.   Cyanocobalamin (VITAMIN B-12 PO) Take 1 tablet by mouth 2 (two) times daily.    DULoxetine (CYMBALTA) 60 MG capsule Take 1 capsule (60 mg total) by mouth daily.   empagliflozin (JARDIANCE) 10 MG TABS tablet Take 1 tablet (10 mg total) by mouth daily.   hydrochlorothiazide (HYDRODIURIL) 25 MG tablet Take 1 tablet (25 mg total) by mouth daily.   ketoconazole (NIZORAL) 2 % cream APPLY TOPICALLY TO THE AFFECTED AREA DAILY   Magnesium Oxide 500 MG CAPS Take 1 capsule (500 mg total) by mouth in the morning.   metFORMIN (GLUCOPHAGE) 500 MG tablet Take 1 tablet (500 mg total) by mouth 2 (two) times daily with a meal.   montelukast (SINGULAIR) 10 MG tablet TAKE 1 TABLET(10 MG) BY MOUTH AT BEDTIME   pantoprazole (PROTONIX) 40 MG tablet Take 40 mg by mouth 2 (two) times daily.   pregabalin (LYRICA) 50 MG capsule Take 1 capsule (50 mg total) by mouth 2 (two) times daily AND 1-2 capsules (50-100 mg total) at bedtime.   sucralfate (CARAFATE) 1 g tablet Take 1 tablet (1 g total) by mouth 3 (three) times daily.   triamcinolone ointment (KENALOG) 0.5 % Apply 1 application topically 2 (two) times daily.   valACYclovir (VALTREX) 1000 MG tablet Take 1 tablet (1,000 mg total) by mouth 2 (two) times daily.   No  facility-administered encounter medications on file as of 05/11/2020.    Patient Active Problem List   Diagnosis Date Noted   Coccygodynia 02/01/2020   Facial swelling 07/21/2019   Acute postoperative pain 06/30/2019   History of allergy to radiographic contrast media 06/02/2019   History of allergy to shellfish 06/02/2019   History of Allergy to iodine 06/02/2019    Class: History of   History of  anaphylaxis 06/02/2019   Other spondylosis, sacral and sacrococcygeal region 04/21/2019   Abnormal MRI, cervical spine (02/08/2019) 03/25/2019   Lower extremity weakness (Bilateral) 03/25/2019   Coordination impairment (lower extremity) 03/25/2019   Hypomagnesemia 03/24/2019   Spondylosis without myelopathy or radiculopathy, lumbosacral region 03/24/2019   Lumbar Facet Hypertrophy 03/24/2019   Grade 1 Anterolisthesis of L4/5 03/11/2019   Chronic hip pain (Bilateral) (R>L) 03/11/2019   Chronic sacroiliac joint pain (Bilateral) 03/11/2019   Sacroiliac joint somatic dysfunction (Bilateral) 03/11/2019   Chronic feet pain (Primary Area of Pain) (Bilateral) 03/11/2019   Chronic leg and foot pain (Bilateral) 03/11/2019   Diabetic peripheral neuropathy ([place]) 03/11/2019   Chronic neuropathic pain 03/11/2019   Neurogenic pain 03/11/2019   Chronic musculoskeletal pain 03/11/2019   Osteoarthritis involving multiple joints 03/11/2019   Chronic pain syndrome 03/10/2019   Pharmacologic therapy 03/10/2019   Disorder of skeletal system 03/10/2019   Problems influencing health status 03/10/2019   Chronic low back pain (Third area of Pain) (Bilateral) w/o sciatica 03/10/2019   Chronic lower extremity pain (Secondary area of Pain) (Bilateral) 03/10/2019   Abnormal MRI, lumbar spine (02/08/2019) 03/10/2019   Essential hypertension 10/06/2018   Cholelithiasis 06/23/2018   Fatty liver 60/01/9322   Helicobacter pylori infection 05/13/2017   Mastalgia in female 04/09/2017   Bilateral leg edema 07/02/2016   DDD (degenerative disc disease), lumbar 12/16/2014   Lumbar facet syndrome (Bilateral) 12/16/2014   Sacroiliac joint dysfunction 12/16/2014   Neuropathy due to secondary diabetes ([place]) 12/16/2014   Vitamin D deficiency 12/13/2014   Morbid obesity ([place]) 12/10/2014   Type 2 diabetes mellitus with diabetic neuropathy, unspecified ([place]) 12/10/2014   Hyperlipidemia  12/10/2014   Gastroesophageal reflux disease without esophagitis 12/10/2014    Conditions to be addressed/monitored: HTN, HLD and DMII  Care Plan : RNCM: Diabetes Type 2 (Adult)  Updates made by Vanita Ingles since 05/11/2020 12:00 AM    Problem: RNCM: Glycemic Management (Diabetes, Type 2)   Priority: Medium    Goal: RNCM: Glycemic Management Optimized   Priority: Medium  Note:   Objective:  Lab Results  Component Value Date   HGBA1C 7.3 (H) 02/18/2020    Lab Results  Component Value Date   CREATININE 0.83 02/18/2020   CREATININE 0.77 09/28/2019   CREATININE 0.88 05/18/2019     No results found for: EGFR Current Barriers:   Knowledge Deficits related to basic Diabetes pathophysiology and self care/management  Knowledge Deficits related to medications used for management of diabetes  Financial Constraints  Limited Social Support  Unable to independently manage DM  Lacks social connections  Does not maintain contact with provider office  Does not contact provider office for questions/concerns Case Manager Clinical Goal(s):   Collaboration with [name], DO regarding development and update of comprehensive plan of care as evidenced by provider attestation and co-signature  Inter-disciplinary care team collaboration (see longitudinal plan of care)  Over the next 120 days, patient will demonstrate improved adherence to prescribed treatment plan for diabetes self care/management as evidenced by:   daily monitoring and recording of CBG  adherence to ADA/ carb modified diet  adherence to prescribed medication regimen Interventions:   Provided education to patient about basic DM disease process  Reviewed medications with patient and discussed importance of medication adherence  Discussed plans with patient for ongoing care management follow up and provided patient with direct contact information for care management team  Provided patient with  written educational materials related to hypo and hyperglycemia and importance of correct treatment  Reviewed scheduled/upcoming provider appointments including: 05-23-2020 at 220 pm  Advised patient, providing education and rationale, to check cbg BID and record, calling pcp for findings outside established parameters.    Referral made to pharmacy team for assistance with help with letter she received about "MIV" and needing documentation that she is not on insulin and other diagnosis information.  The patient to send e-mail with information to the [place] will forward to [facility] team pharmacist.   Collaboration with the pcp and CCM pharmacist concerning needing documentation that she needs a letter stating she does not have multiple heart conditions and she is not insulin dependent. Will send in basket message to the pcp and pharmacist for assistance. Did advise the patient that she could copy her last office visit note off of my Chart that had her diagnosis and medications listed.   Review of patient status, including review of consultants reports, relevant laboratory and other test results, and medications completed. Patient Goals/Self-Care Activities  Over the next 120 days, patient will:  - UNABLE to independently manage DM Attends all scheduled provider appointments Checks blood sugars as prescribed and utilize hyper and hypoglycemia protocol as needed Adheres to prescribed ADA/carb modified - barriers to adherence to treatment plan identified - blood glucose monitoring encouraged - blood glucose readings reviewed - individualized medical nutrition therapy provided - mutual A1C goal set or reviewed - resources required to improve adherence to care identified - self-awareness of signs/symptoms of hypo or hyperglycemia encouraged - use of blood glucose monitoring log promoted Follow Up Plan: Telephone follow up appointment with care management team member scheduled for: 06-07-2020  at 0945 am   Task: RNCM: Alleviate Barriers to Glycemic Management   Note:   Care Management Activities:    - barriers to adherence to treatment plan identified - blood glucose monitoring encouraged - blood glucose readings reviewed - individualized medical nutrition therapy provided - mutual A1C goal set or reviewed - resources required to improve adherence to care identified - self-awareness of signs/symptoms of hypo or hyperglycemia encouraged - use of blood glucose monitoring log promoted       Care Plan : RNCM: Hypertension (Adult)  Updates made by Vanita Ingles since 05/11/2020 12:00 AM    Problem: RNCM: Hypertension (Hypertension)   Priority: Medium    Goal: RNCM: Hypertension Monitored   Priority: Medium  Note:   Objective:   Last practice recorded BP readings:  BP Readings from Last 3 Encounters:  02/18/20 114/83  02/09/20 (!) 133/99  02/03/20 114/76     Most recent eGFR/CrCl: No results found for: EGFR  No components found for: CRCL Current Barriers:   Knowledge Deficits related to basic understanding of hypertension pathophysiology and self care management  Knowledge Deficits related to understanding of medications prescribed for management of hypertension  Limited Social Lawyer.   Unable to independently manage HTN  Lacks social connections  Does not maintain contact with provider office  Does not contact provider office for questions/concerns Case Manager Clinical Goal(s):   Over  the next 120 days, patient will verbalize understanding of plan for hypertension management  Over the next 120 days, patient will attend all scheduled medical appointments: 05-23-2020 at 2:20 pm  Over the next 120 days, patient will demonstrate improved adherence to prescribed treatment plan for hypertension as evidenced by taking all medications as prescribed, monitoring and recording blood pressure as directed, adhering to low sodium/DASH  diet  Over the next 120 days, patient will demonstrate improved health management independence as evidenced by checking blood pressure as directed and notifying PCP if SBP>160 or DBP > 90, taking all medications as prescribe, and adhering to a low sodium diet as discussed.  Over the next 120 days, patient will verbalize basic understanding of hypertension disease process and self health management plan as evidenced by compliance with the plan of care, taking medications as ordered, and working with the CCM team to manage health and well being Interventions:   Collaboration with [name], DO regarding development and update of comprehensive plan of care as evidenced by provider attestation and co-signature  Inter-disciplinary care team collaboration (see longitudinal plan of care)  UNABLE to independently:manage HTN  Evaluation of current treatment plan related to hypertension self management and patient's adherence to plan as established by provider.  Provided education to patient re: stroke prevention, s/s of heart attack and stroke, DASH diet, complications of uncontrolled blood pressure  Reviewed medications with patient and discussed importance of compliance  Discussed plans with patient for ongoing care management follow up and provided patient with direct contact information for care management team  Advised patient, providing education and rationale, to monitor blood pressure daily and record, calling PCP for findings outside established parameters.   Reviewed scheduled/upcoming provider appointments including: 05-23-2020 at 2:20 pm Patient Goals/Self-Care Activities  Over the next 120 days, patient will:  - UNABLE to independently manage HTN Calls provider office for new concerns, questions, or BP outside discussed parameters Checks BP and records as discussed Follows a low sodium diet/DASH diet - blood pressure trends reviewed - depression screen reviewed - home or  ambulatory blood pressure monitoring encouraged Follow Up Plan: Telephone follow up appointment with care management team member scheduled for: 05-23-2020 at 2:20 pm   Task: RNCM: Identify and Monitor Blood Pressure Elevation   Note:   Care Management Activities:    - blood pressure trends reviewed - depression screen reviewed - home or ambulatory blood pressure monitoring encouraged       Care Plan : RNCM: Management of HLD  Updates made by Vanita Ingles since 05/11/2020 12:00 AM    Problem: RNCM: Management of HLD   Priority: Medium    Goal: RNCM: Managment of HLD   Priority: Medium  Note:   Current Barriers:   Poorly controlled hyperlipidemia, complicated by HTN, DM  Current antihyperlipidemic regimen: Lipitor 20 mg QD  Most recent lipid panel:     Component Value Date/Time   CHOL 207 (H) 02/18/2020 1314   TRIG 97 02/18/2020 1314   HDL 122 02/18/2020 1314   CHOLHDL 1.4 01/03/2017 1139   [place] 69 02/18/2020 1314     ASCVD risk enhancing conditions: age 67 DM, HTN  Unable to independently manage HLD  Lacks social connections  Does not maintain contact with provider office  Does not contact provider office for questions/concerns  RN Care Manager Clinical Goal(s):   Over the next 120 days, patient will work with Consulting civil engineer, providers, and care team towards execution of optimized self-health management  plan  Over the next 120 days, patient will verbalize understanding of plan for working with the CCM team to meet health and wellness needs  Over the next 120 days, patient will work with [facility], pharmacist and pcp to address needs related to expressed needs for paperwork with documentation about chronic conditions and medicaitons   Over the next 120 days, patient will attend all scheduled medical appointments: 05-23-2020 at 220 pm  Over the next 30 days, patient will work with CM team pharmacist to get needed documentation expressed in the call today.    Interventions:  Collaboration with [name], DO regarding development and update of comprehensive plan of care as evidenced by provider attestation and co-signature  Inter-disciplinary care team collaboration (see longitudinal plan of care)  Medication review performed; medication list updated in electronic medical record.   Inter-disciplinary care team collaboration (see longitudinal plan of care)  Referred to pharmacy team for assistance with HLD medication management  Evaluation of current treatment plan related to HLD and patient's adherence to plan as established by provider.  Advised patient to send letter to the [facility] email address for review and forwarding to the pharmacist  Provided education to patient re: printing off last office visit note from 02-17-2021 with pcp as supporting documentation to the expressed needs during the call  Reviewed medications with patient and discussed compliance   Collaborated with pcp and pharmacist  regarding needs expressed for documentation needed for insurance purposes   Discussed plans with patient for ongoing care management follow up and provided patient with direct contact information for care management team  Reviewed scheduled/upcoming provider appointments including: 05-23-2020 at 220 pm  Pharmacy referral for assistance with expressed paperwork for insurance purposes    Patient Goals/Self-Care Activities:  Over the next 120 days, patient will:   - call for medicine refill 2 or 3 days before it runs out - call if I am sick and can't take my medicine - keep a list of all the medicines I take; vitamins and herbals too - learn to read medicine labels - use a pillbox to sort medicine - use an alarm clock or phone to remind me to take my medicine - change to whole grain breads, cereal, pasta - drink 6 to 8 glasses of water each day - eat 3 to 5 servings of fruits and vegetables each day - fill half the plate with  nonstarchy vegetables - limit fast food meals to no more than 1 per week - manage portion size - prepare main meal at home 3 to 5 days each week - read food labels for fat, fiber, carbohydrates and portion size - be open to making changes - I can manage, know and watch for signs of a heart attack - if I have chest pain, call for help - learn about small changes that will make a big difference - learn my personal risk factors  - barriers to medication adherence identified - medication list compiled - medication list reviewed - medication-adherence assessment completed - self-management plan initiated or updated - understanding of current medications assessed  Follow Up Plan: Telephone follow up appointment with care management team member scheduled for:06-07-2020 at 0945 am     Task: RNCM: management of HLD   Note:   Care Management Activities:    - barriers to medication adherence identified - medication list compiled - medication list reviewed - medication-adherence assessment completed - self-management plan initiated or updated - understanding of current medications assessed  Plan: Telephone follow up appointment with care management team member scheduled for:  06-07-2020 at [place] am  [name] RN, MSN, [facility] Mobile: [phone]

## 2020-05-15 IMAGING — MG DIGITAL SCREENING BILAT W/ TOMO W/ CAD
6 of 10 series · 6 of 30 positions shown · non-contrast
Comparison: Previous exam(s).

CLINICAL DATA: Screening.

EXAM:
DIGITAL SCREENING BILATERAL MAMMOGRAM WITH TOMO AND CAD

[R MLO synth-2D (1 of 2)]
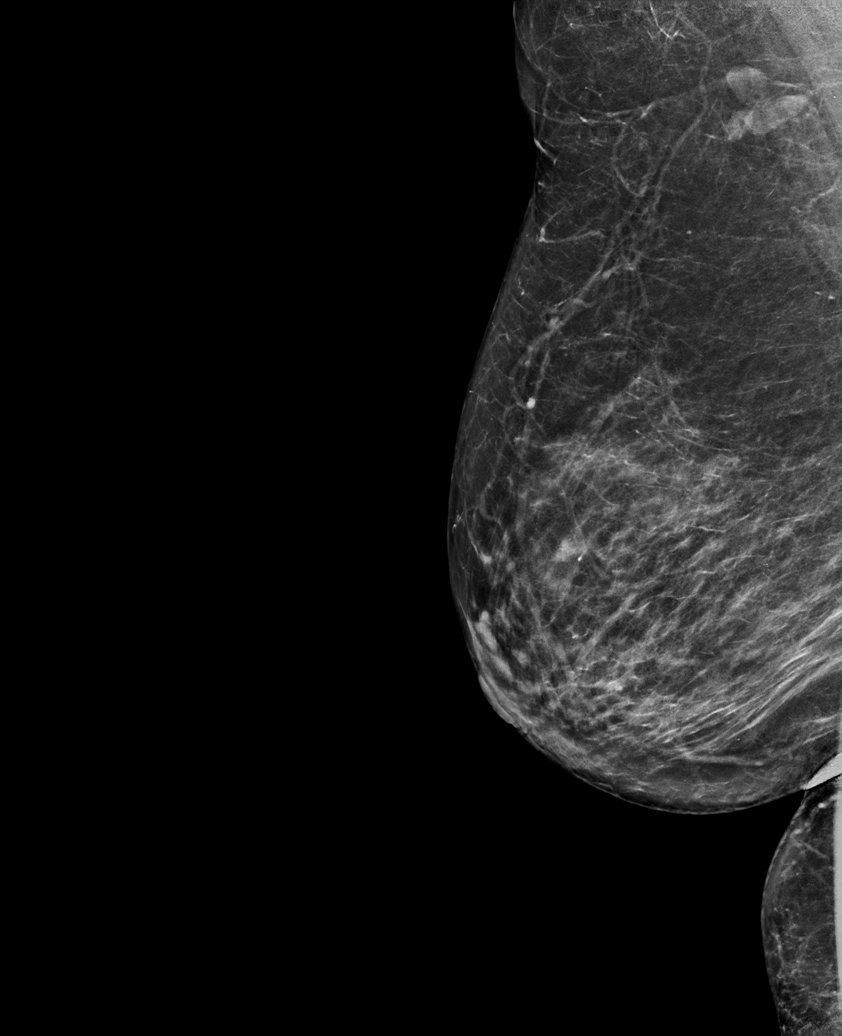

[L CC synth-2D]
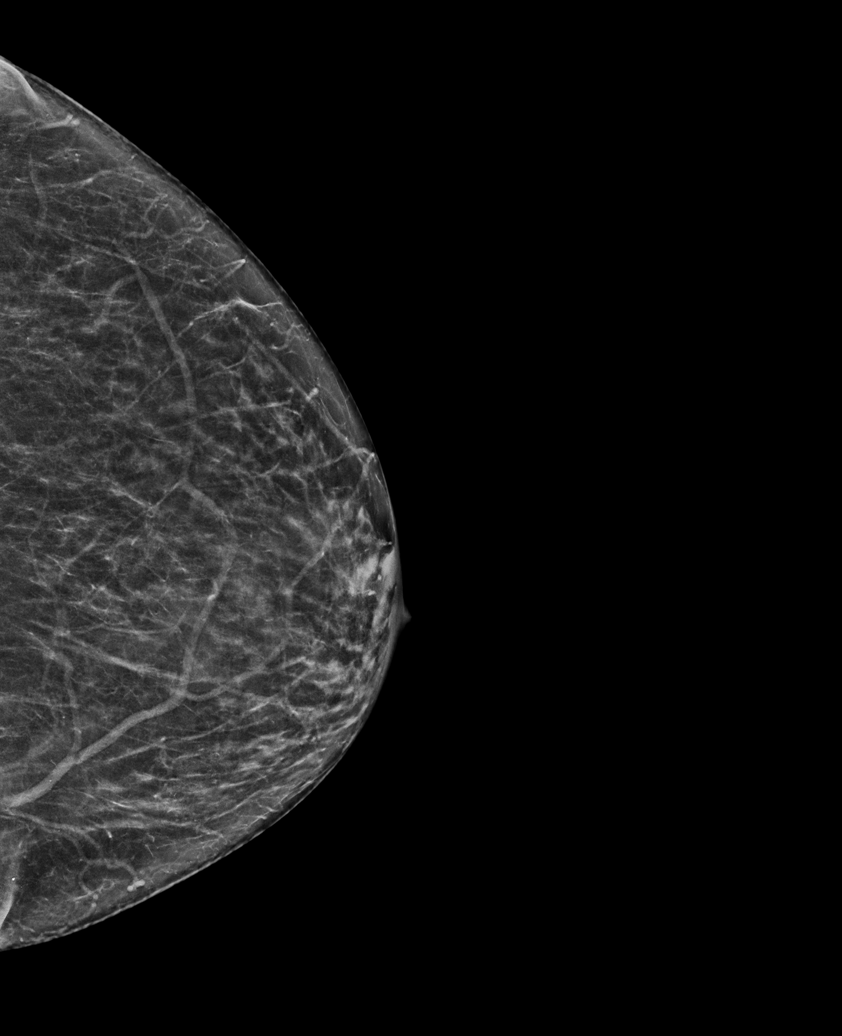

[R MLO synth-2D (2 of 2)]
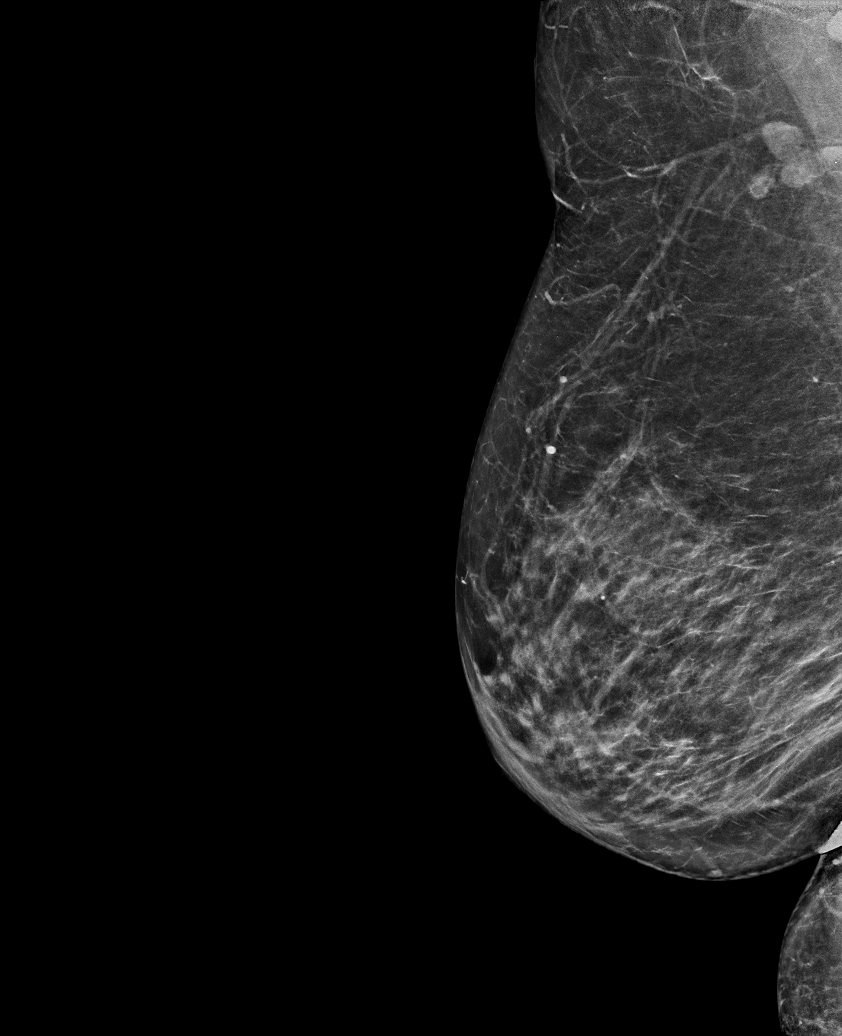

[L MLO synth-2D]
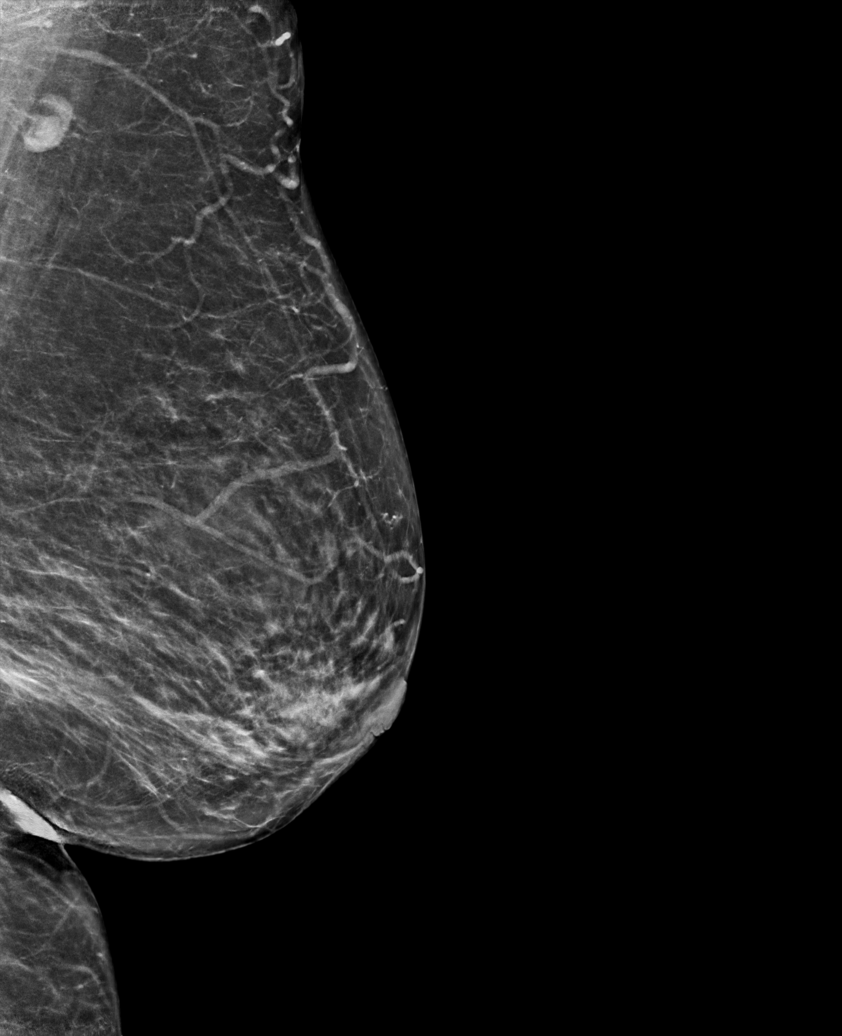

[R CC synth-2D]
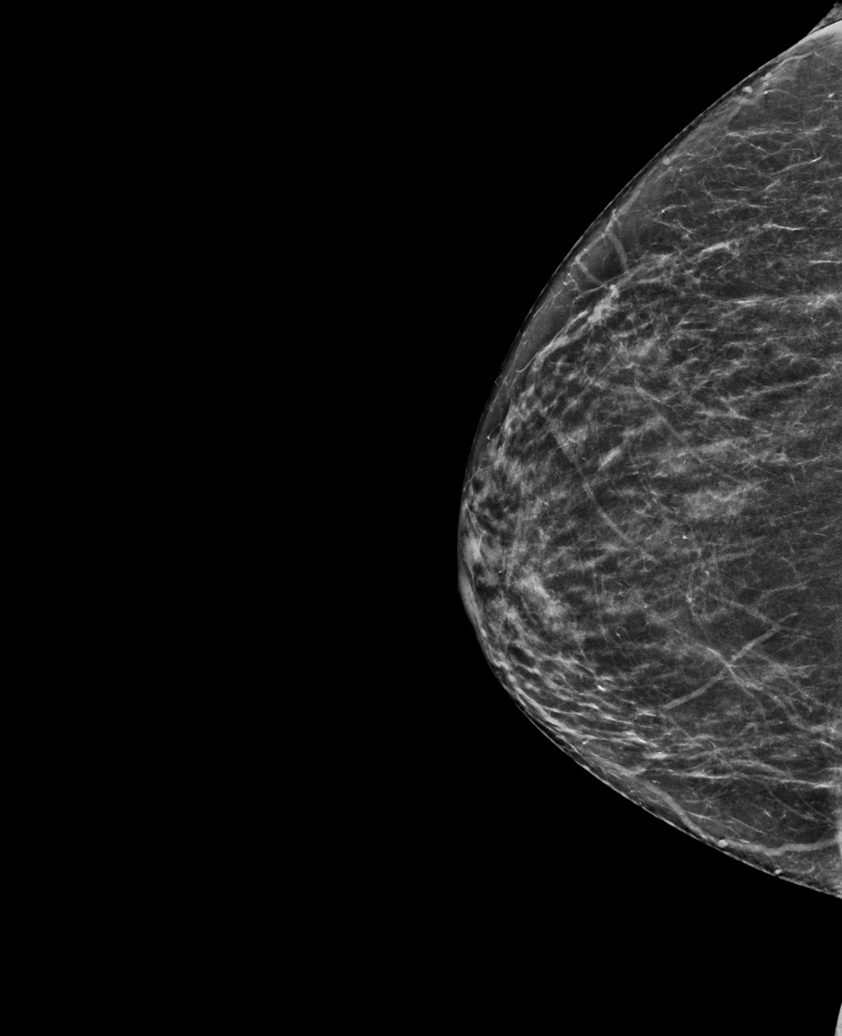

[R CC tomo · tomo slice 33/64.0]
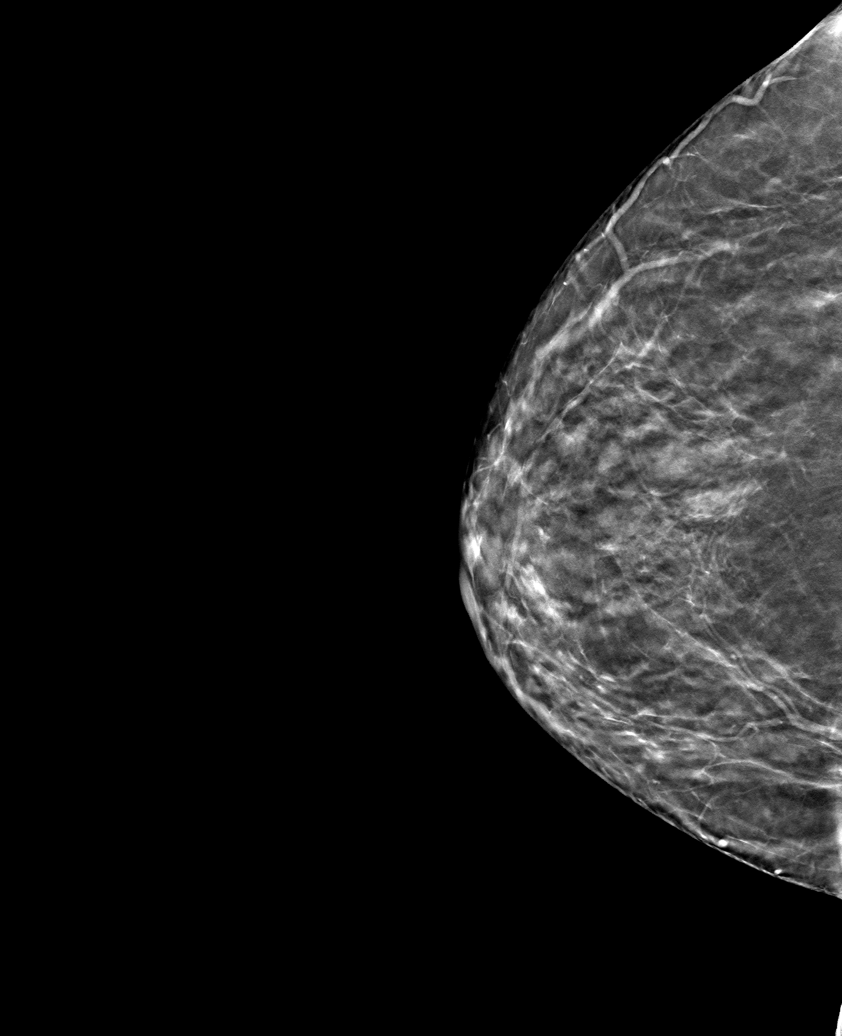

[6 of 30 positions shown; findings below may reference images not displayed]

ACR Breast Density Category c: The breast tissue is heterogeneously
dense, which may obscure small masses.
FINDINGS: There are no findings suspicious for malignancy. Images were
processed with CAD.
IMPRESSION: No mammographic evidence of malignancy. A result letter of this
screening mammogram will be mailed directly to the patient.

RECOMMENDATION:
Screening mammogram in one year. (Code:[id number])

BI-RADS CATEGORY  1: Negative.

## 2020-05-18 ENCOUNTER — Telehealth: Payer: Self-pay

## 2020-05-20 DIAGNOSIS — M7989 Other specified soft tissue disorders: Secondary | ICD-10-CM | POA: Diagnosis not present

## 2020-05-20 DIAGNOSIS — G20 Parkinson's disease: Secondary | ICD-10-CM | POA: Diagnosis not present

## 2020-05-20 DIAGNOSIS — R52 Pain, unspecified: Secondary | ICD-10-CM | POA: Diagnosis not present

## 2020-05-20 DIAGNOSIS — M25572 Pain in left ankle and joints of left foot: Secondary | ICD-10-CM | POA: Diagnosis not present

## 2020-05-23 ENCOUNTER — Ambulatory Visit: Payer: BLUE CROSS/BLUE SHIELD | Admitting: Family Medicine

## 2020-05-23 ENCOUNTER — Other Ambulatory Visit: Payer: Self-pay

## 2020-05-23 ENCOUNTER — Encounter: Payer: Self-pay | Admitting: Family Medicine

## 2020-05-23 VITALS — BP 114/81 | HR 94 | Temp 98.7°F | Wt 190.0 lb

## 2020-05-23 DIAGNOSIS — E1140 Type 2 diabetes mellitus with diabetic neuropathy, unspecified: Secondary | ICD-10-CM | POA: Diagnosis not present

## 2020-05-23 DIAGNOSIS — G4733 Obstructive sleep apnea (adult) (pediatric): Secondary | ICD-10-CM

## 2020-05-23 LAB — BAYER DCA HB A1C WAIVED: HB A1C (BAYER DCA - WAIVED): 7.7 % — ABNORMAL HIGH (ref <7.0)

## 2020-05-23 MED ORDER — EMPAGLIFLOZIN 25 MG PO TABS: 25.0000 mg | ORAL_TABLET | Freq: Every day | ORAL | 1 refills | Status: DC

## 2020-05-23 NOTE — Progress Notes (Signed)
BP 114/81   Pulse 94   Temp 98.7 F (37.1 C)   Wt 190 lb (86.2 kg)   SpO2 99%   BMI 33.66 kg/m    Subjective:    Patient ID: Lisa Galvan, female    DOB: 1960/09/10, 60 y.o.   MRN: [id number]  HPI: Lisa Galvan is a 60 y.o. female  Chief Complaint  Patient presents with  . Diabetes   Has fallen 3x since I saw her last. She is going to do PT through neurology. She was just diagnosed with OSA.  DIABETES Hypoglycemic episodes:no Polydipsia/polyuria: no Visual disturbance: no Chest pain: no Paresthesias: yes Glucose Monitoring: yes  Accucheck frequency: Daily - 98-132 Taking Insulin?: no Blood Pressure Monitoring: not checking Retinal Examination: up to date Foot Exam: Up to Date Diabetic Education: Completed Pneumovax: Up to Date Influenza: Up to Date Aspirin: yes  Relevant past medical, surgical, family and social history reviewed and updated as indicated. Interim medical history since our last visit reviewed. Allergies and medications reviewed and updated.  Review of Systems  Constitutional: Negative.   Respiratory: Negative.   Cardiovascular: Negative.   Gastrointestinal: Negative.   Musculoskeletal: Negative.   Psychiatric/Behavioral: Negative.     Per HPI unless specifically indicated above     Objective:    BP 114/81   Pulse 94   Temp 98.7 F (37.1 C)   Wt 190 lb (86.2 kg)   SpO2 99%   BMI 33.66 kg/m   Wt Readings from Last 3 Encounters:  05/23/20 190 lb (86.2 kg)  02/18/20 188 lb (85.3 kg)  02/09/20 190 lb (86.2 kg)    Physical Exam Vitals and nursing note reviewed.  Constitutional:      General: She is not in acute distress.    Appearance: Normal appearance. She is not ill-appearing, toxic-appearing or diaphoretic.  HENT:     Head: Normocephalic and atraumatic.     Right Ear: External ear normal.     Left Ear: External ear normal.     Nose: Nose normal.     Mouth/Throat:     Mouth: Mucous membranes are moist.     Pharynx:  Oropharynx is clear.  Eyes:     General: No scleral icterus.       Right eye: No discharge.        Left eye: No discharge.     Extraocular Movements: Extraocular movements intact.     Conjunctiva/sclera: Conjunctivae normal.     Pupils: Pupils are equal, round, and reactive to light.  Cardiovascular:     Rate and Rhythm: Normal rate and regular rhythm.     Pulses: Normal pulses.     Heart sounds: Normal heart sounds. No murmur heard. No friction rub. No gallop.   Pulmonary:     Effort: Pulmonary effort is normal. No respiratory distress.     Breath sounds: Normal breath sounds. No stridor. No wheezing, rhonchi or rales.  Chest:     Chest wall: No tenderness.  Musculoskeletal:        General: Normal range of motion.     Cervical back: Normal range of motion and neck supple.  Skin:    General: Skin is warm and dry.     Capillary Refill: Capillary refill takes less than 2 seconds.     Coloration: Skin is not jaundiced or pale.     Findings: No bruising, erythema, lesion or rash.  Neurological:     General: No focal deficit present.  Mental Status: She is alert and oriented to person, place, and time. Mental status is at baseline.  Psychiatric:        Mood and Affect: Mood normal.        Behavior: Behavior normal.        Thought Content: Thought content normal.        Judgment: Judgment normal.     Results for orders placed or performed in visit on 05/23/20  Bayer DCA Hb A1c Waived  Result Value Ref Range   HB A1C (BAYER DCA - WAIVED) 7.7 (H) <7.0 %      Assessment & Plan:   Problem List Items Addressed This Visit      Respiratory   OSA (obstructive sleep apnea)    Newly diagnosed- getting CPAP through neurology. Continue to monitor.        Endocrine   Type 2 diabetes mellitus with diabetic neuropathy, unspecified (HCC) - Primary    Going in the wrong direction with A1c of 7.7 up from 7.3- will work on diet and increase her jardiance to 25mg . Recheck 3 months.  Call with any concerns.       Relevant Medications   empagliflozin (JARDIANCE) 25 MG TABS tablet   Other Relevant Orders   Bayer DCA Hb A1c Waived (Completed)       Follow up plan: Return in about 3 months (around 08/20/2020).

## 2020-05-23 NOTE — Assessment & Plan Note (Signed)
Newly diagnosed- getting CPAP through neurology. Continue to monitor.

## 2020-05-23 NOTE — Assessment & Plan Note (Signed)
Going in the wrong direction with A1c of 7.7 up from 7.3- will work on diet and increase her jardiance to 25mg . Recheck 3 months. Call with any concerns.

## 2020-05-25 ENCOUNTER — Telehealth: Payer: Self-pay

## 2020-05-26 ENCOUNTER — Telehealth: Payer: Self-pay | Admitting: Family Medicine

## 2020-05-26 NOTE — Telephone Encounter (Signed)
Please tell her that her x-ray came back normal. No fracture.

## 2020-05-26 NOTE — Telephone Encounter (Signed)
Patient called and informed, patient verbalized understanding

## 2020-05-29 DIAGNOSIS — K219 Gastro-esophageal reflux disease without esophagitis: Secondary | ICD-10-CM | POA: Diagnosis not present

## 2020-05-29 DIAGNOSIS — E782 Mixed hyperlipidemia: Secondary | ICD-10-CM | POA: Diagnosis not present

## 2020-05-29 DIAGNOSIS — E1142 Type 2 diabetes mellitus with diabetic polyneuropathy: Secondary | ICD-10-CM | POA: Diagnosis not present

## 2020-05-29 DIAGNOSIS — I10 Essential (primary) hypertension: Secondary | ICD-10-CM | POA: Diagnosis not present

## 2020-05-29 DIAGNOSIS — G20 Parkinson's disease: Secondary | ICD-10-CM | POA: Diagnosis not present

## 2020-05-29 DIAGNOSIS — Z791 Long term (current) use of non-steroidal anti-inflammatories (NSAID): Secondary | ICD-10-CM | POA: Diagnosis not present

## 2020-05-29 DIAGNOSIS — G4733 Obstructive sleep apnea (adult) (pediatric): Secondary | ICD-10-CM | POA: Diagnosis not present

## 2020-05-29 DIAGNOSIS — Z9181 History of falling: Secondary | ICD-10-CM | POA: Diagnosis not present

## 2020-05-29 DIAGNOSIS — Z7984 Long term (current) use of oral hypoglycemic drugs: Secondary | ICD-10-CM | POA: Diagnosis not present

## 2020-05-29 DIAGNOSIS — Z792 Long term (current) use of antibiotics: Secondary | ICD-10-CM | POA: Diagnosis not present

## 2020-05-29 DIAGNOSIS — Z6833 Body mass index (BMI) 33.0-33.9, adult: Secondary | ICD-10-CM | POA: Diagnosis not present

## 2020-05-29 DIAGNOSIS — G44329 Chronic post-traumatic headache, not intractable: Secondary | ICD-10-CM | POA: Diagnosis not present

## 2020-05-29 DIAGNOSIS — E538 Deficiency of other specified B group vitamins: Secondary | ICD-10-CM | POA: Diagnosis not present

## 2020-05-29 DIAGNOSIS — G2581 Restless legs syndrome: Secondary | ICD-10-CM | POA: Diagnosis not present

## 2020-05-30 ENCOUNTER — Encounter: Payer: Self-pay | Admitting: Family Medicine

## 2020-05-30 ENCOUNTER — Ambulatory Visit: Payer: Self-pay | Admitting: Pharmacist

## 2020-05-30 NOTE — Progress Notes (Signed)
Chronic Care Management Pharmacy Note  05/30/2020 Name:  Lisa Galvan MRN:  [id number] DOB:  Apr 18, 1961  Subjective: Lisa Galvan is an 60 y.o. year old female who is a primary patient of Lisa Roys, DO.  The CCM team was consulted for assistance with disease management and care coordination needs.    Engaged with patient by telephone for follow up visit in response to provider referral for pharmacy case management and/or care coordination services.   Consent to Services:  The patient was given information about Chronic Care Management services, agreed to services, and gave verbal consent prior to initiation of services.  Please see initial visit note for detailed documentation.   Patient Care Team: Lisa Roys, DO as PCP - General (Family Medicine) Vanita Ingles, RN as Case Manager ([place]) Lisa Cutter, LCSW as Social Worker (Licensed Clinical Social Worker) Lisa Galvan, [facility] as Pharmacist (Pharmacist)  Recent office visits: 05/23/20- dr. Wynetta Emery- a1c, jardiance increased    Objective:  Lab Results  Component Value Date   CREATININE 0.83 02/18/2020   BUN 8 02/18/2020   GFRNONAA 77 02/18/2020   GFRAA 89 02/18/2020   NA 140 02/18/2020   K 3.4 (L) 02/18/2020   CALCIUM 10.5 (H) 02/18/2020   CO2 30 (H) 02/18/2020    Lab Results  Component Value Date/Time   HGBA1C 7.7 (H) 05/23/2020 02:33 PM   HGBA1C 7.3 (H) 02/18/2020 01:12 PM   MICROALBUR 30 (H) 02/18/2020 01:12 PM   MICROALBUR 80 (H) 09/28/2019 09:59 AM    Last diabetic Eye exam:  Lab Results  Component Value Date/Time   HMDIABEYEEXA No Retinopathy 08/19/2019 12:00 AM    Last diabetic Foot exam: No results found for: HMDIABFOOTEX   Lab Results  Component Value Date   CHOL 207 (H) 02/18/2020   HDL 122 02/18/2020   LDLCALC 69 02/18/2020   TRIG 97 02/18/2020   CHOLHDL 1.4 01/03/2017    Hepatic Function Latest Ref Rng & Units 02/18/2020 09/28/2019 04/13/2019  Total Protein 6.0 - 8.5  g/dL 7.0 6.9 6.6  Albumin 3.8 - 4.9 g/dL 4.6 4.8 4.4  AST 0 - 40 IU/L 63(H) 15 14  ALT 0 - 32 IU/L 45(H) 11 8  Alk Phosphatase 44 - 121 IU/L 96 69 63  Total Bilirubin 0.0 - 1.2 mg/dL 1.2 0.3 0.7  Bilirubin, Direct 0.00 - 0.40 mg/dL - - -    Lab Results  Component Value Date/Time   TSH 0.968 02/18/2020 01:14 PM   TSH 0.780 04/13/2019 08:36 AM    CBC Latest Ref Rng & Units 02/18/2020 04/13/2019 10/06/2018  WBC 3.4 - 10.8 x10E3/uL 6.2 6.1 4.2  Hemoglobin 11.1 - 15.9 g/dL 13.4 12.7 13.2  Hematocrit 34.0 - 46.6 % 41.1 38.0 39.1  Platelets 150 - 450 x10E3/uL 248 271 231    Lab Results  Component Value Date/Time   VD25OH 44.1 02/18/2020 01:14 PM   VD25OH 50.0 04/13/2019 08:36 AM    Clinical ASCVD: No  The ASCVD Risk score Lisa Bussing DC Jr., et al., 2013) failed to calculate for the following reasons:   The valid HDL cholesterol range is 20 to 100 mg/dL    Depression screen [facility] 2/9 02/18/2020 06/30/2019 03/11/2019  Decreased Interest 0 0 0  Down, Depressed, Hopeless 0 0 0  PHQ - 2 Score 0 0 0  Altered sleeping 0 - -  Tired, decreased energy 0 - -  Change in appetite 0 - -  Feeling bad or failure about yourself  0 - -  Trouble concentrating 0 - -  Moving slowly or fidgety/restless 0 - -  Suicidal thoughts 0 - -  PHQ-9 Score 0 - -  Difficult doing work/chores Not difficult at all - -       Social History   Tobacco Use  Smoking Status Never Smoker  Smokeless Tobacco Never Used   BP Readings from Last 3 Encounters:  05/23/20 114/81  02/18/20 114/83  02/09/20 (!) 133/99   Pulse Readings from Last 3 Encounters:  05/23/20 94  02/18/20 83  02/09/20 88   Wt Readings from Last 3 Encounters:  05/23/20 190 lb (86.2 kg)  02/18/20 188 lb (85.3 kg)  02/09/20 190 lb (86.2 kg)    Assessment/Interventions: Review of patient past medical history, allergies, medications, health status, including review of consultants reports, laboratory and other test data, was performed as part of  comprehensive evaluation and provision of chronic care management services.   SDOH:  (Social Determinants of Health) assessments and interventions performed:   CCM Care Plan  Allergies  Allergen Reactions  . Amlodipine Besy-Benazepril Hcl Anaphylaxis  . Ivp Dye [Iodinated Diagnostic Agents] Anaphylaxis    Pt is unaware   . Shellfish Allergy Swelling and Anaphylaxis  . Shellfish-Derived Products Anaphylaxis  . Ace Inhibitors Swelling    Medications Reviewed Today    Reviewed by Lisa Roys, DO (Physician) on 05/23/20 at 2102  Med List Status: <None>  Medication Order Taking? Sig Documenting Provider Last Dose Status Informant  acetaminophen (TYLENOL) 500 MG tablet [id number]  Take 2 tablets (1,000 mg total) by mouth every 8 (eight) hours.  Patient taking differently: Take 1,000 mg by mouth every 8 (eight) hours as needed for moderate pain.    Leim Fabry, MD  Active Self  ALLERGY RELIEF 180 MG tablet [id number]  TAKE 1 TABLET BY MOUTH EVERY DAY Galvan, Lisa P, DO  Active   amLODipine (NORVASC) 5 MG tablet [id number]  TAKE 1 TABLET(5 MG) BY MOUTH DAILY Galvan, Lisa P, DO  Active   atorvastatin (LIPITOR) 20 MG tablet [id number]  Take 1 tablet (20 mg total) by mouth daily. Galvan, Lisa P, DO  Active   Cholecalciferol (VITAMIN D-3) 5000 units TABS [id number]  Take 5,000 Units by mouth daily. [provider]  Active Self  Cyanocobalamin (VITAMIN B-12 PO) [id number]  Take 1 tablet by mouth 2 (two) times daily.  [provider]  Active Self  DULoxetine (CYMBALTA) 60 MG capsule [id number]  Take 1 capsule (60 mg total) by mouth daily. Galvan, Lisa P, DO  Active   empagliflozin (JARDIANCE) 25 MG TABS tablet [id number]  Take 1 tablet (25 mg total) by mouth daily. Galvan, Lisa P, DO  Active   etodolac (LODINE) 400 MG tablet [id number] Yes Take by mouth. [provider]  Active   hydrochlorothiazide (HYDRODIURIL) 25 MG tablet [id number]  Take 1 tablet (25 mg total)  by mouth daily. Galvan, Lisa P, DO  Active   ketoconazole (NIZORAL) 2 % cream [id number]  APPLY TOPICALLY TO THE AFFECTED AREA DAILY Lisa Roys, DO  Active   Magnesium Oxide 500 MG CAPS [id number]  Take 1 capsule (500 mg total) by mouth in the morning. Park Liter P, DO  Expired 05/18/20 2359   metFORMIN (GLUCOPHAGE) 500 MG tablet [id number]  Take 1 tablet (500 mg total) by mouth 2 (two) times daily with a meal. Galvan, Lisa P, DO  Active   montelukast (SINGULAIR) 10 MG tablet [id number]  TAKE 1 TABLET(10 MG) BY  MOUTH AT BEDTIME Galvan, Lisa P, DO  Active   omeprazole (PRILOSEC) 40 MG capsule [id number]  Take 40 mg by mouth 2 (two) times daily. [provider]  Active   pregabalin (LYRICA) 50 MG capsule [id number]  Take 1 capsule (50 mg total) by mouth 2 (two) times daily AND 1-2 capsules (50-100 mg total) at bedtime. Park Liter P, DO  Expired 05/18/20 2359   sucralfate (CARAFATE) 1 g tablet [id number]  Take 1 tablet (1 g total) by mouth 3 (three) times daily. Park Liter P, DO  Expired 03/19/20 2359   triamcinolone ointment (KENALOG) 0.5 % [id number]  Apply 1 application topically 2 (two) times daily. Galvan, Lisa P, DO  Active   valACYclovir (VALTREX) 1000 MG tablet [id number]  Take 1 tablet (1,000 mg total) by mouth 2 (two) times daily. Lisa Roys, DO  Active   Med List Note Landis Martins, [place] 02/03/20 1442): 02-03-20 PA sent for pregabalin 50 mg via CMM dw               Patient Active Problem List   Diagnosis Date Noted  . OSA (obstructive sleep apnea) 05/23/2020  . Coccygodynia 02/01/2020  . Facial swelling 07/21/2019  . Acute postoperative pain 06/30/2019  . History of allergy to radiographic contrast media 06/02/2019  . History of allergy to shellfish 06/02/2019  . History of Allergy to iodine 06/02/2019    Class: History of  . History of anaphylaxis 06/02/2019  . Other spondylosis, sacral and sacrococcygeal region 04/21/2019  . Abnormal MRI,  cervical spine (02/08/2019) 03/25/2019  . Lower extremity weakness (Bilateral) 03/25/2019  . Coordination impairment (lower extremity) 03/25/2019  . Hypomagnesemia 03/24/2019  . Spondylosis without myelopathy or radiculopathy, lumbosacral region 03/24/2019  . Lumbar Facet Hypertrophy 03/24/2019  . Grade 1 Anterolisthesis of L4/5 03/11/2019  . Chronic hip pain (Bilateral) (R>L) 03/11/2019  . Chronic sacroiliac joint pain (Bilateral) 03/11/2019  . Sacroiliac joint somatic dysfunction (Bilateral) 03/11/2019  . Chronic feet pain (Primary Area of Pain) (Bilateral) 03/11/2019  . Chronic leg and foot pain (Bilateral) 03/11/2019  . Diabetic peripheral neuropathy ([place]) 03/11/2019  . Chronic neuropathic pain 03/11/2019  . Neurogenic pain 03/11/2019  . Chronic musculoskeletal pain 03/11/2019  . Osteoarthritis involving multiple joints 03/11/2019  . Chronic pain syndrome 03/10/2019  . Pharmacologic therapy 03/10/2019  . Disorder of skeletal system 03/10/2019  . Problems influencing health status 03/10/2019  . Chronic low back pain (Third area of Pain) (Bilateral) w/o sciatica 03/10/2019  . Chronic lower extremity pain (Secondary area of Pain) (Bilateral) 03/10/2019  . Abnormal MRI, lumbar spine (02/08/2019) 03/10/2019  . Essential hypertension 10/06/2018  . Cholelithiasis 06/23/2018  . Fatty liver 06/23/2018  . Helicobacter pylori infection 05/13/2017  . Mastalgia in female 04/09/2017  . Bilateral leg edema 07/02/2016  . DDD (degenerative disc disease), lumbar 12/16/2014  . Lumbar facet syndrome (Bilateral) 12/16/2014  . Sacroiliac joint dysfunction 12/16/2014  . Neuropathy due to secondary diabetes ([place]) 12/16/2014  . Vitamin D deficiency 12/13/2014  . Morbid obesity ([place]) 12/10/2014  . Type 2 diabetes mellitus with diabetic neuropathy, unspecified ([place]) 12/10/2014  . Hyperlipidemia 12/10/2014  . Gastroesophageal reflux disease without esophagitis 12/10/2014    Immunization History   Administered Date(s) Administered  . Influenza,inj,Quad PF,6+ Mos 05/19/2015, 02/16/2016, 01/03/2017, 01/02/2019, 02/18/2020  . Influenza-Unspecified 05/19/2015, 02/16/2016, 01/03/2017, 07/03/2017, 01/03/2018, 01/02/2019  . PFIZER(Purple Top)SARS-COV-2 Vaccination 06/12/2019, 01/25/2020, 01/25/2020  . Pneumococcal Polysaccharide-23 07/03/2017  . Td 01/03/2018    Conditions to be addressed/monitored:  HTN, DMII  and fatty liver, neuropathy  There are no care plans that you recently modified to display for this patient.    Medication Assistance: None required.  Patient affirms current coverage meets needs.  Patient's preferred pharmacy is:  [id number] [address], [place] AT [facility] OF [address] [place] [address] Phone: [phone] Fax: [phone]  Uses pill box? Yes Pt endorses 90% compliance  We discussed: Benefits of medication synchronization, packaging and delivery as well as enhanced pharmacist oversight with [employer]. Patient decided to: Continue current medication management strategy   Junita Push. Kenton Kingfisher PharmD, [facility] [phone]    Current Barriers:  . Unable to independently monitor therapeutic efficacy . Unable to achieve control of diabetes and hypertension . Patient needs documentation of non-insulin use from prescriber for life insurance "MIV" application correction.   Pharmacist Clinical Goal(s):  Marland Kitchen Over the next 180 days, patient will verbalize ability to afford treatment regimen . achieve adherence to monitoring guidelines and medication adherence to achieve therapeutic efficacy . achieve control of diabetes as evidenced by A1c through collaboration with PharmD and provider.   Interventions: . 1:1 collaboration with Lisa Roys, DO regarding development and update of comprehensive plan of care as evidenced by provider  attestation and co-signature . Inter-disciplinary care team collaboration (see longitudinal plan of care) . Comprehensive medication review performed; medication list updated in electronic medical record  Hypertension (BP goal <130/80) -controlled -Current treatment: . Amlodipine 5 mg qd . hctz 25 mg qd -Medications previously tried: NA -Current home readings: 114/78 -Denies hypotensive/hypertensive symptoms -Educated on BP goals and benefits of medications for prevention of heart attack, stroke and kidney damage; Daily salt intake goal < 2300 mg; Exercise goal of 150 minutes per week; -Counseled to monitor BP at home 2-3 times weekly, document, and provide log at future appointments -Counseled on diet and exercise extensively Recommended to continue current medication Recommended resume walking 5 days/week with 30 minute goal Lab Results  Component Value Date/Time   HGBA1C 7.7 (H) 05/23/2020 02:33 PM   HGBA1C 7.3 (H) 02/18/2020 01:12 PM    Diabetes (A1c goal <7%) -uncontrolled -Current medications: . Jardiance 25 mg qd . Metformin 500 mg bid -Medications previously tried: NA -Current home glucose readings . fasting glucose: 90- 125 . post prandial glucose: -Denies hypoglycemic/hyperglycemic symptoms Jardiance increased at last PCP visit. BG trending down. -Current exercise: Starting PT for balance. She has a prescription for a walker with wheels on it. She plans to contact DME suppliers today. She walks around [place] when the weather is good. -Educated onA1c and blood sugar goals; Prevention and management of hypoglycemic episodes; Benefits of routine self-monitoring of blood sugar; -Counseled to check feet daily and get yearly eye exams Message sent to Prescriber and CMA pool regarding documentation of diabetes and insulin use. This is not something I can provide to patient.     Patient Goals/Self-Care Activities . Over the next 180 days, patient will:  -  take medications as prescribed check glucose daily, document, and provide at future appointments check blood pressure 2-3 times weekly, document, and provide at future appointments target a minimum of 150 minutes of moderate intensity exercise weekly  Follow Up Plan: Telephone follow up appointment with care management team member scheduled for: 6 months

## 2020-05-31 DIAGNOSIS — Z6833 Body mass index (BMI) 33.0-33.9, adult: Secondary | ICD-10-CM | POA: Diagnosis not present

## 2020-05-31 DIAGNOSIS — G4733 Obstructive sleep apnea (adult) (pediatric): Secondary | ICD-10-CM | POA: Diagnosis not present

## 2020-05-31 DIAGNOSIS — G20 Parkinson's disease: Secondary | ICD-10-CM | POA: Diagnosis not present

## 2020-05-31 DIAGNOSIS — Z7984 Long term (current) use of oral hypoglycemic drugs: Secondary | ICD-10-CM | POA: Diagnosis not present

## 2020-05-31 DIAGNOSIS — Z9181 History of falling: Secondary | ICD-10-CM | POA: Diagnosis not present

## 2020-05-31 DIAGNOSIS — G2581 Restless legs syndrome: Secondary | ICD-10-CM | POA: Diagnosis not present

## 2020-05-31 DIAGNOSIS — E782 Mixed hyperlipidemia: Secondary | ICD-10-CM | POA: Diagnosis not present

## 2020-05-31 DIAGNOSIS — K219 Gastro-esophageal reflux disease without esophagitis: Secondary | ICD-10-CM | POA: Diagnosis not present

## 2020-05-31 DIAGNOSIS — E1142 Type 2 diabetes mellitus with diabetic polyneuropathy: Secondary | ICD-10-CM | POA: Diagnosis not present

## 2020-05-31 DIAGNOSIS — Z792 Long term (current) use of antibiotics: Secondary | ICD-10-CM | POA: Diagnosis not present

## 2020-05-31 DIAGNOSIS — G44329 Chronic post-traumatic headache, not intractable: Secondary | ICD-10-CM | POA: Diagnosis not present

## 2020-05-31 DIAGNOSIS — Z791 Long term (current) use of non-steroidal anti-inflammatories (NSAID): Secondary | ICD-10-CM | POA: Diagnosis not present

## 2020-05-31 DIAGNOSIS — I10 Essential (primary) hypertension: Secondary | ICD-10-CM | POA: Diagnosis not present

## 2020-05-31 DIAGNOSIS — E538 Deficiency of other specified B group vitamins: Secondary | ICD-10-CM | POA: Diagnosis not present

## 2020-06-02 DIAGNOSIS — G4733 Obstructive sleep apnea (adult) (pediatric): Secondary | ICD-10-CM | POA: Diagnosis not present

## 2020-06-02 DIAGNOSIS — K219 Gastro-esophageal reflux disease without esophagitis: Secondary | ICD-10-CM | POA: Diagnosis not present

## 2020-06-02 DIAGNOSIS — Z792 Long term (current) use of antibiotics: Secondary | ICD-10-CM | POA: Diagnosis not present

## 2020-06-02 DIAGNOSIS — G2581 Restless legs syndrome: Secondary | ICD-10-CM | POA: Diagnosis not present

## 2020-06-02 DIAGNOSIS — Z791 Long term (current) use of non-steroidal anti-inflammatories (NSAID): Secondary | ICD-10-CM | POA: Diagnosis not present

## 2020-06-02 DIAGNOSIS — Z6833 Body mass index (BMI) 33.0-33.9, adult: Secondary | ICD-10-CM | POA: Diagnosis not present

## 2020-06-02 DIAGNOSIS — E1142 Type 2 diabetes mellitus with diabetic polyneuropathy: Secondary | ICD-10-CM | POA: Diagnosis not present

## 2020-06-02 DIAGNOSIS — E538 Deficiency of other specified B group vitamins: Secondary | ICD-10-CM | POA: Diagnosis not present

## 2020-06-02 DIAGNOSIS — E782 Mixed hyperlipidemia: Secondary | ICD-10-CM | POA: Diagnosis not present

## 2020-06-02 DIAGNOSIS — Z7984 Long term (current) use of oral hypoglycemic drugs: Secondary | ICD-10-CM | POA: Diagnosis not present

## 2020-06-02 DIAGNOSIS — I10 Essential (primary) hypertension: Secondary | ICD-10-CM | POA: Diagnosis not present

## 2020-06-02 DIAGNOSIS — Z9181 History of falling: Secondary | ICD-10-CM | POA: Diagnosis not present

## 2020-06-02 DIAGNOSIS — G20 Parkinson's disease: Secondary | ICD-10-CM | POA: Diagnosis not present

## 2020-06-02 DIAGNOSIS — G44329 Chronic post-traumatic headache, not intractable: Secondary | ICD-10-CM | POA: Diagnosis not present

## 2020-06-07 ENCOUNTER — Ambulatory Visit: Payer: Self-pay | Admitting: General Practice

## 2020-06-07 ENCOUNTER — Telehealth: Payer: BLUE CROSS/BLUE SHIELD | Admitting: General Practice

## 2020-06-07 DIAGNOSIS — Z792 Long term (current) use of antibiotics: Secondary | ICD-10-CM | POA: Diagnosis not present

## 2020-06-07 DIAGNOSIS — E538 Deficiency of other specified B group vitamins: Secondary | ICD-10-CM | POA: Diagnosis not present

## 2020-06-07 DIAGNOSIS — E782 Mixed hyperlipidemia: Secondary | ICD-10-CM | POA: Diagnosis not present

## 2020-06-07 DIAGNOSIS — G44329 Chronic post-traumatic headache, not intractable: Secondary | ICD-10-CM | POA: Diagnosis not present

## 2020-06-07 DIAGNOSIS — G20 Parkinson's disease: Secondary | ICD-10-CM | POA: Diagnosis not present

## 2020-06-07 DIAGNOSIS — E1142 Type 2 diabetes mellitus with diabetic polyneuropathy: Secondary | ICD-10-CM | POA: Diagnosis not present

## 2020-06-07 DIAGNOSIS — G4733 Obstructive sleep apnea (adult) (pediatric): Secondary | ICD-10-CM | POA: Diagnosis not present

## 2020-06-07 DIAGNOSIS — Z9181 History of falling: Secondary | ICD-10-CM | POA: Diagnosis not present

## 2020-06-07 DIAGNOSIS — Z791 Long term (current) use of non-steroidal anti-inflammatories (NSAID): Secondary | ICD-10-CM | POA: Diagnosis not present

## 2020-06-07 DIAGNOSIS — G2581 Restless legs syndrome: Secondary | ICD-10-CM | POA: Diagnosis not present

## 2020-06-07 DIAGNOSIS — I10 Essential (primary) hypertension: Secondary | ICD-10-CM

## 2020-06-07 DIAGNOSIS — E1140 Type 2 diabetes mellitus with diabetic neuropathy, unspecified: Secondary | ICD-10-CM

## 2020-06-07 DIAGNOSIS — Z6833 Body mass index (BMI) 33.0-33.9, adult: Secondary | ICD-10-CM | POA: Diagnosis not present

## 2020-06-07 DIAGNOSIS — K219 Gastro-esophageal reflux disease without esophagitis: Secondary | ICD-10-CM | POA: Diagnosis not present

## 2020-06-07 DIAGNOSIS — E785 Hyperlipidemia, unspecified: Secondary | ICD-10-CM

## 2020-06-07 DIAGNOSIS — Z7984 Long term (current) use of oral hypoglycemic drugs: Secondary | ICD-10-CM | POA: Diagnosis not present

## 2020-06-07 NOTE — Chronic Care Management (AMB) (Signed)
Care Management    RN Visit Note  06/07/2020 Name: Lisa Galvan MRN: [id number] DOB: [birth year]  Subjective: Lisa Galvan is a 60 y.o. year old female who is a primary care patient of Lisa Roys, DO. The care management team was consulted for assistance with disease management and care coordination needs.    Engaged with patient by telephone for follow up visit in response to provider referral for case management and/or care coordination services.   Consent to Services:   [name] was given information about Care Management services today including:  1. Care Management services includes personalized support from designated clinical staff supervised by her physician, including individualized plan of care and coordination with other care providers 2. 24/7 contact phone numbers for assistance for urgent and routine care needs. 3. The patient may stop case management services at any time by phone call to the office staff.  Patient agreed to services and consent obtained.   Assessment: Review of patient past medical history, allergies, medications, health status, including review of consultants reports, laboratory and other test data, was performed as part of comprehensive evaluation and provision of chronic care management services.   SDOH (Social Determinants of Health) assessments and interventions performed:    Care Plan  Allergies  Allergen Reactions   Amlodipine Besy-Benazepril Hcl Anaphylaxis   Ivp Dye [Iodinated Diagnostic Agents] Anaphylaxis    Pt is unaware    Shellfish Allergy Swelling and Anaphylaxis   Shellfish-Derived Products Anaphylaxis   Ace Inhibitors Swelling    Outpatient Encounter Medications as of 06/07/2020  Medication Sig   acetaminophen (TYLENOL) 500 MG tablet Take 2 tablets (1,000 mg total) by mouth every 8 (eight) hours. (Patient taking differently: Take 1,000 mg by mouth every 8 (eight) hours as needed for moderate pain.)   ALLERGY RELIEF 180  MG tablet TAKE 1 TABLET BY MOUTH EVERY DAY   amLODipine (NORVASC) 5 MG tablet TAKE 1 TABLET(5 MG) BY MOUTH DAILY   aspirin EC 81 MG tablet Take 81 mg by mouth daily. Swallow whole.   atorvastatin (LIPITOR) 20 MG tablet Take 1 tablet (20 mg total) by mouth daily.   Cholecalciferol (VITAMIN D-3) 5000 units TABS Take 5,000 Units by mouth daily.   Cyanocobalamin (VITAMIN B-12 PO) Take 1 tablet by mouth 2 (two) times daily.    DULoxetine (CYMBALTA) 60 MG capsule Take 1 capsule (60 mg total) by mouth daily.   empagliflozin (JARDIANCE) 25 MG TABS tablet Take 1 tablet (25 mg total) by mouth daily.   etodolac (LODINE) 400 MG tablet Take by mouth.   hydrochlorothiazide (HYDRODIURIL) 25 MG tablet Take 1 tablet (25 mg total) by mouth daily.   ketoconazole (NIZORAL) 2 % cream APPLY TOPICALLY TO THE AFFECTED AREA DAILY   Magnesium Oxide 500 MG CAPS Take 1 capsule (500 mg total) by mouth in the morning.   metFORMIN (GLUCOPHAGE) 500 MG tablet Take 1 tablet (500 mg total) by mouth 2 (two) times daily with a meal.   montelukast (SINGULAIR) 10 MG tablet TAKE 1 TABLET(10 MG) BY MOUTH AT BEDTIME   omeprazole (PRILOSEC) 40 MG capsule Take 40 mg by mouth 2 (two) times daily.   pregabalin (LYRICA) 50 MG capsule Take 1 capsule (50 mg total) by mouth 2 (two) times daily AND 1-2 capsules (50-100 mg total) at bedtime.   sucralfate (CARAFATE) 1 g tablet Take 1 tablet (1 g total) by mouth 3 (three) times daily.   triamcinolone ointment (KENALOG) 0.5 % Apply 1 application topically 2 (two)  times daily.   valACYclovir (VALTREX) 1000 MG tablet Take 1 tablet (1,000 mg total) by mouth 2 (two) times daily.   No facility-administered encounter medications on file as of 06/07/2020.    Patient Active Problem List   Diagnosis Date Noted   OSA (obstructive sleep apnea) 05/23/2020   Coccygodynia 02/01/2020   Facial swelling 07/21/2019   Acute postoperative pain 06/30/2019   History of allergy to  radiographic contrast media 06/02/2019   History of allergy to shellfish 06/02/2019   History of Allergy to iodine 06/02/2019    Class: History of   History of anaphylaxis 06/02/2019   Other spondylosis, sacral and sacrococcygeal region 04/21/2019   Abnormal MRI, cervical spine (02/08/2019) 03/25/2019   Lower extremity weakness (Bilateral) 03/25/2019   Coordination impairment (lower extremity) 03/25/2019   Hypomagnesemia 03/24/2019   Spondylosis without myelopathy or radiculopathy, lumbosacral region 03/24/2019   Lumbar Facet Hypertrophy 03/24/2019   Grade 1 Anterolisthesis of L4/5 03/11/2019   Chronic hip pain (Bilateral) (R>L) 03/11/2019   Chronic sacroiliac joint pain (Bilateral) 03/11/2019   Sacroiliac joint somatic dysfunction (Bilateral) 03/11/2019   Chronic feet pain (Primary Area of Pain) (Bilateral) 03/11/2019   Chronic leg and foot pain (Bilateral) 03/11/2019   Diabetic peripheral neuropathy ([place]) 03/11/2019   Chronic neuropathic pain 03/11/2019   Neurogenic pain 03/11/2019   Chronic musculoskeletal pain 03/11/2019   Osteoarthritis involving multiple joints 03/11/2019   Chronic pain syndrome 03/10/2019   Pharmacologic therapy 03/10/2019   Disorder of skeletal system 03/10/2019   Problems influencing health status 03/10/2019   Chronic low back pain (Third area of Pain) (Bilateral) w/o sciatica 03/10/2019   Chronic lower extremity pain (Secondary area of Pain) (Bilateral) 03/10/2019   Abnormal MRI, lumbar spine (02/08/2019) 03/10/2019   Essential hypertension 10/06/2018   Cholelithiasis 06/23/2018   Fatty liver 61/95/0932   Helicobacter pylori infection 05/13/2017   Mastalgia in female 04/09/2017   Bilateral leg edema 07/02/2016   DDD (degenerative disc disease), lumbar 12/16/2014   Lumbar facet syndrome (Bilateral) 12/16/2014   Sacroiliac joint dysfunction 12/16/2014   Neuropathy due to secondary diabetes ([place]) 12/16/2014    Vitamin D deficiency 12/13/2014   Morbid obesity ([place]) 12/10/2014   Type 2 diabetes mellitus with diabetic neuropathy, unspecified ([place]) 12/10/2014   Hyperlipidemia 12/10/2014   Gastroesophageal reflux disease without esophagitis 12/10/2014    Conditions to be addressed/monitored: HTN, HLD and DMII  Care Plan : RNCM: Diabetes Type 2 (Adult)  Updates made by Vanita Ingles since 06/07/2020 12:00 AM    Problem: RNCM: Glycemic Management (Diabetes, Type 2)   Priority: Medium    Long-Range Goal: RNCM: Glycemic Management Optimized   Priority: Medium  Note:   Objective:   Lab Results   Component  Value  Date     HGBA1C  7.7 (H)  05/23/2020      Lab Results   Component  Value  Date     CREATININE  0.83  02/18/2020      No results found for: EGFR Current Barriers:   Knowledge Deficits related to basic Diabetes pathophysiology and self care/management  Knowledge Deficits related to medications used for management of diabetes  Financial Constraints  Limited Social Support  Unable to independently manage DM  Lacks social connections  Does not maintain contact with provider office  Does not contact provider office for questions/concerns Case Manager Clinical Goal(s):   Collaboration with Lisa Roys, DO regarding development and update of comprehensive plan of care as evidenced by provider attestation and co-signature  Inter-disciplinary care team collaboration (see longitudinal plan of care)  Over the next 120 days, patient will demonstrate improved adherence to prescribed treatment plan for diabetes self care/management as evidenced by:   daily monitoring and recording of CBG   adherence to ADA/ carb modified diet  adherence to prescribed medication regimen Interventions:   Provided education to patient about basic DM disease process  Reviewed medications with patient and discussed importance of medication adherence. 06-07-2020: Reviewed  her change in Jardiance and the patient is tolerating well. Denies any issues at this time.  Discussed plans with patient for ongoing care management follow up and provided patient with direct contact information for care management team  Provided patient with written educational materials related to hypo and hyperglycemia and importance of correct treatment.  06-07-2020: The patient denies any episodes of low blood sugars. Average around 130.   Reviewed scheduled/upcoming provider appointments including: 08-23-2020 at [place] am  Advised patient, providing education and rationale, to check cbg BID and record, calling pcp for findings outside established parameters.  06-07-2020: States blood sugars are better since medication changes. The patient verbalized this am her blood sugar was 130.  Referral made to pharmacy team for assistance with help with letter she received about "MIV" and needing documentation that she is not on insulin and other diagnosis information.  The patient to send e-mail with information to the [facility] will forward to CCM team pharmacist. 06-07-2020: The patient is still working on paperwork. Has what she needs from the office.   Collaboration with the pcp and CCM pharmacist concerning needing documentation that she needs a letter stating she does not have multiple heart conditions and she is not insulin dependent. Will send in basket message to the pcp and pharmacist for assistance. Did advise the patient that she could copy her last office visit note off of my Chart that had her diagnosis and medications listed. 06-07-2020: The patient is working with pharmacist.   Review of patient status, including review of consultants reports, relevant laboratory and other test results, and medications completed.  In basket message to Dr. Wynetta Emery and clinical staff at [facility] to make a referral to the breast center in [place] for annual mammogram.  Patient Goals/Self-Care  Activities  Over the next 120 days, patient will:  - UNABLE to independently manage DM Attends all scheduled provider appointments Checks blood sugars as prescribed and utilize hyper and hypoglycemia protocol as needed Adheres to prescribed ADA/carb modified. 06-07-2020: The patient is working on dietary changes.  The RNCM sending planning healthy meals document via mail to the patient.  - barriers to adherence to treatment plan identified - blood glucose monitoring encouraged - blood glucose readings reviewed.  06-07-2020: Average readings 130 - individualized medical nutrition therapy provided - mutual A1C goal set or reviewed - resources required to improve adherence to care identified - self-awareness of signs/symptoms of hypo or hyperglycemia encouraged - use of blood glucose monitoring log promoted Follow Up Plan: Telephone follow up appointment with care management team member scheduled for: 08-02-2020 at 0945 am   Care Plan : RNCM: Hypertension (Adult)  Updates made by Vanita Ingles since 06/07/2020 12:00 AM    Problem: RNCM: Hypertension (Hypertension)   Priority: Medium    Goal: RNCM: Hypertension Monitored   Priority: Medium  Note:   Objective:   Last practice recorded BP readings:   BP Readings from Last 3 Encounters:   05/23/20  114/81   02/18/20  114/83  02/09/20  (!) 133/99      Most recent eGFR/CrCl: No results found for: EGFR  No components found for: CRCL Current Barriers:   Knowledge Deficits related to basic understanding of hypertension pathophysiology and self care management  Knowledge Deficits related to understanding of medications prescribed for management of hypertension  Limited Social Lawyer.   Unable to independently manage HTN  Lacks social connections  Does not maintain contact with provider office  Does not contact provider office for questions/concerns Case Manager Clinical Goal(s):   Over the next  120 days, patient will verbalize understanding of plan for hypertension management  Over the next 120 days, patient will attend all scheduled medical appointments: 05-23-2020 at 2:20 pm  Over the next 120 days, patient will demonstrate improved adherence to prescribed treatment plan for hypertension as evidenced by taking all medications as prescribed, monitoring and recording blood pressure as directed, adhering to low sodium/DASH diet  Over the next 120 days, patient will demonstrate improved health management independence as evidenced by checking blood pressure as directed and notifying PCP if SBP>160 or DBP > 90, taking all medications as prescribe, and adhering to a low sodium diet as discussed.  Over the next 120 days, patient will verbalize basic understanding of hypertension disease process and self health management plan as evidenced by compliance with the plan of care, taking medications as ordered, and working with the CCM team to manage health and well being Interventions:   Collaboration with Lisa Roys, DO regarding development and update of comprehensive plan of care as evidenced by provider attestation and co-signature  Inter-disciplinary care team collaboration (see longitudinal plan of care)  UNABLE to independently:manage HTN  Evaluation of current treatment plan related to hypertension self management and patient's adherence to plan as established by provider.  Provided education to patient re: stroke prevention, s/s of heart attack and stroke, DASH diet, complications of uncontrolled blood pressure. 06-07-2020: Review of the benefits of a heart healthy/ADA diet. Information sent to the patient via mail.   Reviewed medications with patient and discussed importance of compliance  Discussed plans with patient for ongoing care management follow up and provided patient with direct contact information for care management team  Advised patient, providing education and  rationale, to monitor blood pressure daily and record, calling PCP for findings outside established parameters.   Reviewed scheduled/upcoming provider appointments including: 08-23-2020 at 0840 am Patient Goals/Self-Care Activities  Over the next 120 days, patient will:  - UNABLE to independently manage HTN Calls provider office for new concerns, questions, or BP outside discussed parameters Checks BP and records as discussed Follows a low sodium diet/DASH diet - blood pressure trends reviewed - depression screen reviewed - home or ambulatory blood pressure monitoring encouraged Follow Up Plan: Telephone follow up appointment with care management team member scheduled for: 08-02-2020 at 0945 am   Care Plan : RNCM: Management of HLD  Updates made by Vanita Ingles since 06/07/2020 12:00 AM    Problem: RNCM: Management of HLD   Priority: Medium    Goal: RNCM: Managment of HLD   Priority: Medium  Note:   Current Barriers:   Poorly controlled hyperlipidemia, complicated by HTN, DM  Current antihyperlipidemic regimen: Lipitor 20 mg QD  Most recent lipid panel:   Lab Results   Component  Value  Date     CHOL  207 (H)  02/18/2020     HDL  122  02/18/2020     [place]  69  02/18/2020     TRIG  97  02/18/2020     CHOLHDL  1.4  01/03/2017      ASCVD risk enhancing conditions: age 60 DM, HTN  Unable to independently manage HLD  Lacks social connections  Does not maintain contact with provider office  Does not contact provider office for questions/concerns  RN Care Manager Clinical Goal(s):   Over the next 120 days, patient will work with Consulting civil engineer, providers, and care team towards execution of optimized self-health management plan  Over the next 120 days, patient will verbalize understanding of plan for working with the CCM team to meet health and wellness needs  Over the next 120 days, patient will work with [facility], pharmacist and pcp to address  needs related to expressed needs for paperwork with documentation about chronic conditions and medicaitons   Over the next 120 days, patient will attend all scheduled medical appointments: 08-23-2020 at 0840 am  Over the next 30 days, patient will work with CM team pharmacist to get needed documentation expressed in the call today.   Interventions:  Collaboration with Lisa Roys, DO regarding development and update of comprehensive plan of care as evidenced by provider attestation and co-signature  Inter-disciplinary care team collaboration (see longitudinal plan of care)  Medication review performed; medication list updated in electronic medical record.   Inter-disciplinary care team collaboration (see longitudinal plan of care)  Referred to pharmacy team for assistance with HLD medication management. 06-07-2020: The patient currently working with the pharm D.   Evaluation of current treatment plan related to HLD and patient's adherence to plan as established by provider.  Advised patient to send letter to the [facility] email address for review and forwarding to the pharmacist  Provided education to patient re: printing off last office visit note from 02-17-2021 with pcp as supporting documentation to the expressed needs during the call.  06-07-2020: The patient is still working on getting new insurance. She will let RNCM know if she needs additional assistance. Did ask for supply company that could assist with script she has for a walker with seat and breaks. Provided numbers to medical supply companies in [facility] for the patient to call.   Reviewed medications with patient and discussed compliance   Collaborated with pcp and pharmacist  regarding needs expressed for documentation needed for insurance purposes   Discussed plans with patient for ongoing care management follow up and provided patient with direct contact information for care management team  Reviewed  scheduled/upcoming provider appointments including: 08-23-2020 at 840 am  Pharmacy referral for assistance with expressed paperwork for insurance purposes    Patient Goals/Self-Care Activities:  Over the next 120 days, patient will:   - call for medicine refill 2 or 3 days before it runs out - call if I am sick and can't take my medicine - keep a list of all the medicines I take; vitamins and herbals too - learn to read medicine labels - use a pillbox to sort medicine - use an alarm clock or phone to remind me to take my medicine - change to whole grain breads, cereal, pasta - drink 6 to 8 glasses of water each day - eat 3 to 5 servings of fruits and vegetables each day - fill half the plate with nonstarchy vegetables - limit fast food meals to no more than 1 per week - manage portion size - prepare main meal at home 3 to 5 days each week -  read food labels for fat, fiber, carbohydrates and portion size - be open to making changes - I can manage, know and watch for signs of a heart attack - if I have chest pain, call for help - learn about small changes that will make a big difference - learn my personal risk factors  - barriers to medication adherence identified - medication list compiled - medication list reviewed - medication-adherence assessment completed - self-management plan initiated or updated - understanding of current medications assessed  Follow Up Plan: Telephone follow up appointment with care management team member scheduled for:08-02-2020 at [place] am       Plan: Telephone follow up appointment with care management team member scheduled for:  08-02-2020 at [place] am  [name] RN, MSN, [facility] Mobile: [phone]

## 2020-06-07 NOTE — Patient Instructions (Signed)
Visit Information  Patient Care Plan: General Social Work (Adult)    Problem Identified: I'm managing my physical and mental health better now   Priority: Medium    Goal: Depressive Symptoms Identified   Start Date: 03/21/2020  Priority: Medium  Note:   Evidence-based guidance:   Identify risk for depression by reviewing presenting symptoms and risk factors.   Review use of medications that contribute to depression such as steroid, narcotic, sedative, antihypertensive, beta blocker, cytoxic agent.   Review related metabolic processes, including infection, anemia, thyroid dysfunction, kidney failure, heart failure, alcohol or substance use.   Perform depression screening using standardized tools to obtain baseline intensity of depressive symptoms.   Perform or refer for a full diagnostic interview when positive screening results are noted; use DSM-5 criteria to determine appropriate diagnosis (e.g., major depression, persistent depressive disorder, unspecified depressive    disorder).   Notes:    Current Barriers:  . Limited social support . Level of care concerns . ADL IADL limitations . Social Isolation . Limited access to caregiver  Clinical Social Work Clinical Goal(s):  Marland Kitchen Over the next 120 days, patient will work with SW to address concerns related to gaining additional support/resource connection in order to maintain health  Interventions: . Inter-disciplinary care team collaboration (see longitudinal plan of care) . Patient interviewed and appropriate assessments performed . Provided patient with information about available personal care service resources within [facility] ([facility], Private Pay, [facility] and Day Programs). Patient reports that she is not eligible for Medicaid and therefore would be unable to get PCS. Patient states that she may have to pay out of pocket for personal care. . Discussed plans with patient for ongoing care management follow up and provided  patient with direct contact information for care management team . Advised patient to contact CCM LCSW if future social work needs arise. . Assisted patient/caregiver with obtaining information about health plan benefits . Provided education and assistance to client regarding Advanced Directives. . Patient reports ongoing cramping and tingling with her toes and fingers. She reports that her toes will sometimes turn purple. She is going to discuss this issue with PCP during office visit this month. . Patient reports that her daughter stayed with her for 3 months but relocated back to a different state. Patient reports doing well with this adjustment. Family is hopeful that daughter can relocate back to [place] soon in order to provide caregiver to patient. Patient shares that her neighbors are moving and she is interested in her daughter buying this residence.  . Patient reports that she has been receiving ongoing socialization through church. However, she reports that last Sunday she attended a social event after church and became too hot and had to leave. LCSW provided education on appropriate and safe health care which includes learning her limitations. Patient admits that she did not eat enough before she left on Sunday which could be why she felt so weak.  . Stress and depression related to current circumstances with ankle repair. Patient reports that she is having difficulty accepting her current health state. . Patient reports that she is using her car for transportation instead of her jeep because she is unable to get in and out of it due to its' height.  . Patient is currently in [place] visiting family but has her return flight back to [place] this afternoon. She shares that she had a minor fall/trip while she was visiting with family but it is healing well. She was very  excited that she was able to visit her grandchildren and go with them to meet [place].  Patient Self Care Activities:  . Calls provider  office for new concerns or questions . Lacks social connections  Please see past updates related to this goal by clicking on the "Past Updates" button in the selected goal    Task: Identify Depressive Symptoms and Facilitate Treatment   Note:   Care Management Activities:    - DSM-5 clinical interview performed    Notes:    Patient Care Plan: RNCM: Diabetes Type 2 (Adult)    Problem Identified: RNCM: Glycemic Management (Diabetes, Type 2)   Priority: Medium    Long-Range Goal: RNCM: Glycemic Management Optimized   Priority: Medium  Note:   Objective:  . Lab Results .  Component . Value . Date .   Marland Kitchen HGBA1C . 7.7 (H) . 05/23/2020 .    Marland Kitchen Lab Results .  Component . Value . Date .   Marland Kitchen CREATININE . 0.83 . 02/18/2020 .    Marland Kitchen No results found for: EGFR Current Barriers:  Marland Kitchen Knowledge Deficits related to basic Diabetes pathophysiology and self care/management . Knowledge Deficits related to medications used for management of diabetes . Film/video editor . Limited Social Support . Unable to independently manage DM . Lacks social connections . Does not maintain contact with provider office . Does not contact provider office for questions/concerns Case Manager Clinical Goal(s):  Marland Kitchen Collaboration with [name], DO regarding development and update of comprehensive plan of care as evidenced by provider attestation and co-signature . Inter-disciplinary care team collaboration (see longitudinal plan of care) . Over the next 120 days, patient will demonstrate improved adherence to prescribed treatment plan for diabetes self care/management as evidenced by:  . daily monitoring and recording of CBG  . adherence to ADA/ carb modified diet . adherence to prescribed medication regimen Interventions:  . Provided education to patient about basic DM disease process . Reviewed medications with patient and discussed importance of medication adherence. 06-07-2020: Reviewed her change in  [place] and the patient is tolerating well. Denies any issues at this time. . Discussed plans with patient for ongoing care management follow up and provided patient with direct contact information for care management team . Provided patient with written educational materials related to hypo and hyperglycemia and importance of correct treatment.  06-07-2020: The patient denies any episodes of low blood sugars. Average around 130.  Marland Kitchen Reviewed scheduled/upcoming provider appointments including: 08-23-2020 at [place] am . Advised patient, providing education and rationale, to check cbg BID and record, calling pcp for findings outside established parameters.  06-07-2020: States blood sugars are better since medication changes. The patient verbalized this am her blood sugar was 130. Marland Kitchen Referral made to pharmacy team for assistance with help with letter she received about "MIV" and needing documentation that she is not on insulin and other diagnosis information.  The patient to send e-mail with information to the [place] will forward to CCM team pharmacist. 06-07-2020: The patient is still working on paperwork. Has what she needs from the office.  . Collaboration with the pcp and CCM pharmacist concerning needing documentation that she needs a letter stating she does not have multiple heart conditions and she is not insulin dependent. Will send in basket message to the pcp and pharmacist for assistance. Did advise the patient that she could copy her last office visit note off of my Chart that had her diagnosis and medications  listed. 06-07-2020: The patient is working with pharmacist.  . Review of patient status, including review of consultants reports, relevant laboratory and other test results, and medications completed. . In basket message to Dr. Wynetta Emery and clinical staff at [facility] to make a referral to the breast center in [place] for annual mammogram.  Patient Goals/Self-Care Activities . Over the next 120  days, patient will:  - UNABLE to independently manage DM Attends all scheduled provider appointments Checks blood sugars as prescribed and utilize hyper and hypoglycemia protocol as needed Adheres to prescribed ADA/carb modified. 06-07-2020: The patient is working on dietary changes.  The RNCM sending planning healthy meals document via mail to the patient.  - barriers to adherence to treatment plan identified - blood glucose monitoring encouraged - blood glucose readings reviewed.  06-07-2020: Average readings 130 - individualized medical nutrition therapy provided - mutual A1C goal set or reviewed - resources required to improve adherence to care identified - self-awareness of signs/symptoms of hypo or hyperglycemia encouraged - use of blood glucose monitoring log promoted Follow Up Plan: Telephone follow up appointment with care management team member scheduled for: 08-02-2020 at 0945 am   Task: RNCM: Alleviate Barriers to Glycemic Management   Note:   Care Management Activities:    - barriers to adherence to treatment plan identified - blood glucose monitoring encouraged - blood glucose readings reviewed - individualized medical nutrition therapy provided - mutual A1C goal set or reviewed - resources required to improve adherence to care identified - self-awareness of signs/symptoms of hypo or hyperglycemia encouraged - use of blood glucose monitoring log promoted       Patient Care Plan: RNCM: Hypertension (Adult)    Problem Identified: RNCM: Hypertension (Hypertension)   Priority: Medium    Goal: RNCM: Hypertension Monitored   Priority: Medium  Note:   Objective:  . Last practice recorded BP readings:  . BP Readings from Last 3 Encounters: .  05/23/20 . 114/81 .  02/18/20 . 114/83 .  02/09/20 . (!) 133/99 .    Marland Kitchen Most recent eGFR/CrCl: No results found for: EGFR  No components found for: CRCL Current Barriers:  Marland Kitchen Knowledge Deficits related to basic understanding of  hypertension pathophysiology and self care management . Knowledge Deficits related to understanding of medications prescribed for management of hypertension . Limited Social Support . Film/video editor.  . Unable to independently manage HTN . Lacks social connections . Does not maintain contact with provider office . Does not contact provider office for questions/concerns Case Manager Clinical Goal(s):  Marland Kitchen Over the next 120 days, patient will verbalize understanding of plan for hypertension management . Over the next 120 days, patient will attend all scheduled medical appointments: 05-23-2020 at 2:20 pm . Over the next 120 days, patient will demonstrate improved adherence to prescribed treatment plan for hypertension as evidenced by taking all medications as prescribed, monitoring and recording blood pressure as directed, adhering to low sodium/DASH diet . Over the next 120 days, patient will demonstrate improved health management independence as evidenced by checking blood pressure as directed and notifying PCP if SBP>160 or DBP > 90, taking all medications as prescribe, and adhering to a low sodium diet as discussed. . Over the next 120 days, patient will verbalize basic understanding of hypertension disease process and self health management plan as evidenced by compliance with the plan of care, taking medications as ordered, and working with the CCM team to manage health and well being Interventions:  . Collaboration with Park Liter  P, DO regarding development and update of comprehensive plan of care as evidenced by provider attestation and co-signature . Inter-disciplinary care team collaboration (see longitudinal plan of care) . UNABLE to independently:manage HTN . Evaluation of current treatment plan related to hypertension self management and patient's adherence to plan as established by provider. . Provided education to patient re: stroke prevention, s/s of heart attack and stroke,  DASH diet, complications of uncontrolled blood pressure. 06-07-2020: Review of the benefits of a heart healthy/ADA diet. Information sent to the patient via mail.  . Reviewed medications with patient and discussed importance of compliance . Discussed plans with patient for ongoing care management follow up and provided patient with direct contact information for care management team . Advised patient, providing education and rationale, to monitor blood pressure daily and record, calling PCP for findings outside established parameters.  . Reviewed scheduled/upcoming provider appointments including: 08-23-2020 at [place] am Patient Goals/Self-Care Activities . Over the next 120 days, patient will:  - UNABLE to independently manage HTN Calls provider office for new concerns, questions, or BP outside discussed parameters Checks BP and records as discussed Follows a low sodium diet/DASH diet - blood pressure trends reviewed - depression screen reviewed - home or ambulatory blood pressure monitoring encouraged Follow Up Plan: Telephone follow up appointment with care management team member scheduled for: 08-02-2020 at 0945 am   Task: RNCM: Identify and Monitor Blood Pressure Elevation   Note:   Care Management Activities:    - blood pressure trends reviewed - depression screen reviewed - home or ambulatory blood pressure monitoring encouraged       Patient Care Plan: RNCM: Management of HLD    Problem Identified: RNCM: Management of HLD   Priority: Medium    Goal: RNCM: Managment of HLD   Priority: Medium  Note:   Current Barriers:  . Poorly controlled hyperlipidemia, complicated by HTN, DM . Current antihyperlipidemic regimen: Lipitor 20 mg QD . Most recent lipid panel:  . Lab Results .  Component . Value . Date .   Marland Kitchen CHOL . 207 (H) . 02/18/2020 .   Marland Kitchen HDL . 122 . 02/18/2020 .   Marland Kitchen [place] . 69 . 02/18/2020 .   Marland Kitchen TRIG . 97 . 02/18/2020 .   Marland Kitchen CHOLHDL . 1.4 . 01/03/2017 .    Marland Kitchen ASCVD  risk enhancing conditions: age 3 DM, HTN . Unable to independently manage HLD . Lacks social connections . Does not maintain contact with provider office . Does not contact provider office for questions/concerns  RN Care Manager Clinical Goal(s):  Marland Kitchen Over the next 120 days, patient will work with Consulting civil engineer, providers, and care team towards execution of optimized self-health management plan . Over the next 120 days, patient will verbalize understanding of plan for working with the CCM team to meet health and wellness needs . Over the next 120 days, patient will work with [facility], pharmacist and pcp to address needs related to expressed needs for paperwork with documentation about chronic conditions and medicaitons  . Over the next 120 days, patient will attend all scheduled medical appointments: 08-23-2020 at 0840 am . Over the next 30 days, patient will work with CM team pharmacist to get needed documentation expressed in the call today.   Interventions: . Collaboration with [name], DO regarding development and update of comprehensive plan of care as evidenced by provider attestation and co-signature . Inter-disciplinary care team collaboration (see longitudinal plan of care) . Medication review performed;  medication list updated in electronic medical record.  Bertram Savin care team collaboration (see longitudinal plan of care) . Referred to pharmacy team for assistance with HLD medication management. 06-07-2020: The patient currently working with the pharm D.  . Evaluation of current treatment plan related to HLD and patient's adherence to plan as established by provider. . Advised patient to send letter to the [facility] email address for review and forwarding to the pharmacist . Provided education to patient re: printing off last office visit note from 02-17-2021 with pcp as supporting documentation to the expressed needs during the call.  06-07-2020: The patient is still working  on getting new insurance. She will let RNCM know if she needs additional assistance. Did ask for supply company that could assist with script she has for a walker with seat and breaks. Provided numbers to medical supply companies in [facility] for the patient to call.  . Reviewed medications with patient and discussed compliance  . Collaborated with pcp and pharmacist  regarding needs expressed for documentation needed for insurance purposes  . Discussed plans with patient for ongoing care management follow up and provided patient with direct contact information for care management team . Reviewed scheduled/upcoming provider appointments including: 08-23-2020 at 840 am . Pharmacy referral for assistance with expressed paperwork for insurance purposes    Patient Goals/Self-Care Activities: . Over the next 120 days, patient will:   - call for medicine refill 2 or 3 days before it runs out - call if I am sick and can't take my medicine - keep a list of all the medicines I take; vitamins and herbals too - learn to read medicine labels - use a pillbox to sort medicine - use an alarm clock or phone to remind me to take my medicine - change to whole grain breads, cereal, pasta - drink 6 to 8 glasses of water each day - eat 3 to 5 servings of fruits and vegetables each day - fill half the plate with nonstarchy vegetables - limit fast food meals to no more than 1 per week - manage portion size - prepare main meal at home 3 to 5 days each week - read food labels for fat, fiber, carbohydrates and portion size - be open to making changes - I can manage, know and watch for signs of a heart attack - if I have chest pain, call for help - learn about small changes that will make a big difference - learn my personal risk factors  - barriers to medication adherence identified - medication list compiled - medication list reviewed - medication-adherence assessment completed - self-management plan  initiated or updated - understanding of current medications assessed  Follow Up Plan: Telephone follow up appointment with care management team member scheduled for:08-02-2020 at 0945 am     Task: RNCM: management of HLD   Note:   Care Management Activities:    - barriers to medication adherence identified - medication list compiled - medication list reviewed - medication-adherence assessment completed - self-management plan initiated or updated - understanding of current medications assessed         Patient verbalizes understanding of instructions provided today and agrees to view in [employer].   Telephone follow up appointment with care management team member scheduled for: 08-02-2020  At 0945 am  [name] RN, MSN, [facility] Mobile: [phone]

## 2020-06-09 DIAGNOSIS — G44329 Chronic post-traumatic headache, not intractable: Secondary | ICD-10-CM | POA: Diagnosis not present

## 2020-06-09 DIAGNOSIS — Z6833 Body mass index (BMI) 33.0-33.9, adult: Secondary | ICD-10-CM | POA: Diagnosis not present

## 2020-06-09 DIAGNOSIS — E782 Mixed hyperlipidemia: Secondary | ICD-10-CM | POA: Diagnosis not present

## 2020-06-09 DIAGNOSIS — G20 Parkinson's disease: Secondary | ICD-10-CM | POA: Diagnosis not present

## 2020-06-09 DIAGNOSIS — Z792 Long term (current) use of antibiotics: Secondary | ICD-10-CM | POA: Diagnosis not present

## 2020-06-09 DIAGNOSIS — Z7984 Long term (current) use of oral hypoglycemic drugs: Secondary | ICD-10-CM | POA: Diagnosis not present

## 2020-06-09 DIAGNOSIS — G4733 Obstructive sleep apnea (adult) (pediatric): Secondary | ICD-10-CM | POA: Diagnosis not present

## 2020-06-09 DIAGNOSIS — G2581 Restless legs syndrome: Secondary | ICD-10-CM | POA: Diagnosis not present

## 2020-06-09 DIAGNOSIS — E538 Deficiency of other specified B group vitamins: Secondary | ICD-10-CM | POA: Diagnosis not present

## 2020-06-09 DIAGNOSIS — E1142 Type 2 diabetes mellitus with diabetic polyneuropathy: Secondary | ICD-10-CM | POA: Diagnosis not present

## 2020-06-09 DIAGNOSIS — Z9181 History of falling: Secondary | ICD-10-CM | POA: Diagnosis not present

## 2020-06-09 DIAGNOSIS — K219 Gastro-esophageal reflux disease without esophagitis: Secondary | ICD-10-CM | POA: Diagnosis not present

## 2020-06-09 DIAGNOSIS — I10 Essential (primary) hypertension: Secondary | ICD-10-CM | POA: Diagnosis not present

## 2020-06-09 DIAGNOSIS — Z791 Long term (current) use of non-steroidal anti-inflammatories (NSAID): Secondary | ICD-10-CM | POA: Diagnosis not present

## 2020-06-13 ENCOUNTER — Other Ambulatory Visit: Payer: Self-pay | Admitting: Family Medicine

## 2020-06-13 DIAGNOSIS — Z1231 Encounter for screening mammogram for malignant neoplasm of breast: Secondary | ICD-10-CM

## 2020-06-16 DIAGNOSIS — Z791 Long term (current) use of non-steroidal anti-inflammatories (NSAID): Secondary | ICD-10-CM | POA: Diagnosis not present

## 2020-06-16 DIAGNOSIS — K219 Gastro-esophageal reflux disease without esophagitis: Secondary | ICD-10-CM | POA: Diagnosis not present

## 2020-06-16 DIAGNOSIS — G44329 Chronic post-traumatic headache, not intractable: Secondary | ICD-10-CM | POA: Diagnosis not present

## 2020-06-16 DIAGNOSIS — Z6833 Body mass index (BMI) 33.0-33.9, adult: Secondary | ICD-10-CM | POA: Diagnosis not present

## 2020-06-16 DIAGNOSIS — E1142 Type 2 diabetes mellitus with diabetic polyneuropathy: Secondary | ICD-10-CM | POA: Diagnosis not present

## 2020-06-16 DIAGNOSIS — Z792 Long term (current) use of antibiotics: Secondary | ICD-10-CM | POA: Diagnosis not present

## 2020-06-16 DIAGNOSIS — I10 Essential (primary) hypertension: Secondary | ICD-10-CM | POA: Diagnosis not present

## 2020-06-16 DIAGNOSIS — G20 Parkinson's disease: Secondary | ICD-10-CM | POA: Diagnosis not present

## 2020-06-16 DIAGNOSIS — G2581 Restless legs syndrome: Secondary | ICD-10-CM | POA: Diagnosis not present

## 2020-06-16 DIAGNOSIS — E538 Deficiency of other specified B group vitamins: Secondary | ICD-10-CM | POA: Diagnosis not present

## 2020-06-16 DIAGNOSIS — Z7984 Long term (current) use of oral hypoglycemic drugs: Secondary | ICD-10-CM | POA: Diagnosis not present

## 2020-06-16 DIAGNOSIS — Z9181 History of falling: Secondary | ICD-10-CM | POA: Diagnosis not present

## 2020-06-16 DIAGNOSIS — E782 Mixed hyperlipidemia: Secondary | ICD-10-CM | POA: Diagnosis not present

## 2020-06-16 DIAGNOSIS — G4733 Obstructive sleep apnea (adult) (pediatric): Secondary | ICD-10-CM | POA: Diagnosis not present

## 2020-06-20 ENCOUNTER — Ambulatory Visit: Payer: BLUE CROSS/BLUE SHIELD | Admitting: Licensed Clinical Social Worker

## 2020-06-20 DIAGNOSIS — E1140 Type 2 diabetes mellitus with diabetic neuropathy, unspecified: Secondary | ICD-10-CM

## 2020-06-20 NOTE — Patient Instructions (Signed)
Licensed Clinical Social Worker Visit Information  Goals we discussed today:  Goals Addressed            This Visit's Progress   . SW-Make and Keep All Appointments       Timeframe:  Long-Range Goal Priority:  Medium Start Date:   06/20/20                        Expected End Date:  09/17/20                     Follow Up Date- 08/11/20   - arrange a ride through an agency 1 week before appointment - ask family or friend for a ride - call to cancel if needed - keep a calendar with prescription refill dates - keep a calendar with appointment dates - learn the bus route - use public transportation    Why is this important?    Part of staying healthy is seeing the doctor for follow-up care.   If you forget your appointments, there are some things you can do to stay on track.    CARE PLAN ENTRY (see longitudinal plan of care for additional care plan information)  Current Barriers:  . Patient In need of assistance with connection to community resources in order to attend needed appointments  . Knowledge deficits and need for support, education and care coordination related to community resources support  . ADL IADL limitations and Social Isolation  Clinical Goal(s)  . Over the next 120 days patient will be contact [facility] as advised by LCSW . Over the next 120 days, patient will work with care management team member to address concerns related to gaining mammogram appointment  . Over the next 120 days, patient will work with LCSW to address needs related to lack of self care . Over the next 120 days, patient will implement appropriate coping skills to improve stress management   Interventions provided by LCSW:  . Assessed patient's care coordination needs related to needing a mammogram appointment and discussed ongoing care management follow up  . Patient reports that PCP put in order for a mammogram but when she used the link on MyChart  and put in her zip code it was asking her to chose locations in [place] or [place] instead of [place]. Patient reports that this happened last year. Patient asked CCM LCSW to notify PCP regarding this concern. CCM LCSW informed patient that PCP put in the order her preferred location as [facility]. CCM LCSW provided patient with information about [facility]. Patient is agreeable to contact them today.  . Patient reports that she wishes for CCM LCSW to update PCP on her lyrica medication as well. She reports that this medication is making her extremely tired and she is wondering if she should discontinue medication and get back on a lower dose of gabapentin.  . Patient is actively involved with HH PT.  Marland Kitchen Advised patient to implement appropriate self-care and stress management coping skills into her daily routine* . Collaborated with appropriate clinical care team members regarding patient needs . Patient interviewed and appropriate assessments performed . Discussed plans with patient for ongoing care management follow up and provided patient with direct contact information for care management team . Assisted patient/caregiver with obtaining information about health plan benefits . Provided education and assistance to client regarding Advanced Directives. . Mindfulness or  Relaxation Training, Psychoeducation and/or Health Education, and Problem Solving  Patient Self Care Activities & Deficits:  . Patient is unable to independently navigate community resource options without care coordination support  . Acknowledges deficits and is motivated to resolve concern  . Patient is able to contact [facility]  as discussed today . Lacks social connections . Calls provider office for new concerns or questions, Ability for insight, Independent living, and Motivation for treatment  Initial goal documentation

## 2020-06-20 NOTE — Chronic Care Management (AMB) (Signed)
Care Management Clinical Social Work Note  06/20/2020 Name: Katena Petitjean MRN: [id number] DOB: 12-05-1960  Minna Dumire is a 60 y.o. year old female who is a primary care patient of Dorcas Carrow, DO.  The Care Management team was consulted for assistance with chronic disease management and coordination needs.  Engaged with patient by telephone for follow up visit in response to provider referral for social work chronic care management and care coordination services  Consent to Services:  Ms. Schatzman was given information about Care Management services today including:  1. Care Management services includes personalized support from designated clinical staff supervised by her physician, including individualized plan of care and coordination with other care providers 2. 24/7 contact phone numbers for assistance for urgent and routine care needs. 3. The patient may stop case management services at any time by phone call to the office staff.  Patient agreed to services and consent obtained.   Assessment: Review of patient past medical history, allergies, medications, and health status, including review of relevant consultants reports was performed today as part of a comprehensive evaluation and provision of chronic care management and care coordination services.  SDOH (Social Determinants of Health) assessments and interventions performed:    Advanced Directives Status: See Care Plan for related entries.  Care Plan  Allergies  Allergen Reactions  . Amlodipine Besy-Benazepril Hcl Anaphylaxis  . Ivp Dye [Iodinated Diagnostic Agents] Anaphylaxis    Pt is unaware   . Shellfish Allergy Swelling and Anaphylaxis  . Shellfish-Derived Products Anaphylaxis  . Ace Inhibitors Swelling    Outpatient Encounter Medications as of 06/20/2020  Medication Sig  . acetaminophen (TYLENOL) 500 MG tablet Take 2 tablets (1,000 mg total) by mouth every 8 (eight) hours. (Patient taking differently: Take 1,000 mg by  mouth every 8 (eight) hours as needed for moderate pain.)  . ALLERGY RELIEF 180 MG tablet TAKE 1 TABLET BY MOUTH EVERY DAY  . amLODipine (NORVASC) 5 MG tablet TAKE 1 TABLET(5 MG) BY MOUTH DAILY  . aspirin EC 81 MG tablet Take 81 mg by mouth daily. Swallow whole.  Marland Kitchen atorvastatin (LIPITOR) 20 MG tablet Take 1 tablet (20 mg total) by mouth daily.  . Cholecalciferol (VITAMIN D-3) 5000 units TABS Take 5,000 Units by mouth daily.  . Cyanocobalamin (VITAMIN B-12 PO) Take 1 tablet by mouth 2 (two) times daily.   . DULoxetine (CYMBALTA) 60 MG capsule Take 1 capsule (60 mg total) by mouth daily.  . empagliflozin (JARDIANCE) 25 MG TABS tablet Take 1 tablet (25 mg total) by mouth daily.  Marland Kitchen etodolac (LODINE) 400 MG tablet Take by mouth.  . hydrochlorothiazide (HYDRODIURIL) 25 MG tablet Take 1 tablet (25 mg total) by mouth daily.  Marland Kitchen ketoconazole (NIZORAL) 2 % cream APPLY TOPICALLY TO THE AFFECTED AREA DAILY  . Magnesium Oxide 500 MG CAPS Take 1 capsule (500 mg total) by mouth in the morning.  . metFORMIN (GLUCOPHAGE) 500 MG tablet Take 1 tablet (500 mg total) by mouth 2 (two) times daily with a meal.  . montelukast (SINGULAIR) 10 MG tablet TAKE 1 TABLET(10 MG) BY MOUTH AT BEDTIME  . omeprazole (PRILOSEC) 40 MG capsule Take 40 mg by mouth 2 (two) times daily.  . pregabalin (LYRICA) 50 MG capsule Take 1 capsule (50 mg total) by mouth 2 (two) times daily AND 1-2 capsules (50-100 mg total) at bedtime.  . sucralfate (CARAFATE) 1 g tablet Take 1 tablet (1 g total) by mouth 3 (three) times daily.  Marland Kitchen triamcinolone ointment (KENALOG) 0.5 %  Apply 1 application topically 2 (two) times daily.  . valACYclovir (VALTREX) 1000 MG tablet Take 1 tablet (1,000 mg total) by mouth 2 (two) times daily.   No facility-administered encounter medications on file as of 06/20/2020.    Patient Active Problem List   Diagnosis Date Noted  . OSA (obstructive sleep apnea) 05/23/2020  . Coccygodynia 02/01/2020  . Facial swelling  07/21/2019  . Acute postoperative pain 06/30/2019  . History of allergy to radiographic contrast media 06/02/2019  . History of allergy to shellfish 06/02/2019  . History of Allergy to iodine 06/02/2019    Class: History of  . History of anaphylaxis 06/02/2019  . Other spondylosis, sacral and sacrococcygeal region 04/21/2019  . Abnormal MRI, cervical spine (02/08/2019) 03/25/2019  . Lower extremity weakness (Bilateral) 03/25/2019  . Coordination impairment (lower extremity) 03/25/2019  . Hypomagnesemia 03/24/2019  . Spondylosis without myelopathy or radiculopathy, lumbosacral region 03/24/2019  . Lumbar Facet Hypertrophy 03/24/2019  . Grade 1 Anterolisthesis of L4/5 03/11/2019  . Chronic hip pain (Bilateral) (R>L) 03/11/2019  . Chronic sacroiliac joint pain (Bilateral) 03/11/2019  . Sacroiliac joint somatic dysfunction (Bilateral) 03/11/2019  . Chronic feet pain (Primary Area of Pain) (Bilateral) 03/11/2019  . Chronic leg and foot pain (Bilateral) 03/11/2019  . Diabetic peripheral neuropathy (HCC) 03/11/2019  . Chronic neuropathic pain 03/11/2019  . Neurogenic pain 03/11/2019  . Chronic musculoskeletal pain 03/11/2019  . Osteoarthritis involving multiple joints 03/11/2019  . Chronic pain syndrome 03/10/2019  . Pharmacologic therapy 03/10/2019  . Disorder of skeletal system 03/10/2019  . Problems influencing health status 03/10/2019  . Chronic low back pain (Third area of Pain) (Bilateral) w/o sciatica 03/10/2019  . Chronic lower extremity pain (Secondary area of Pain) (Bilateral) 03/10/2019  . Abnormal MRI, lumbar spine (02/08/2019) 03/10/2019  . Essential hypertension 10/06/2018  . Cholelithiasis 06/23/2018  . Fatty liver 06/23/2018  . Helicobacter pylori infection 05/13/2017  . Mastalgia in female 04/09/2017  . Bilateral leg edema 07/02/2016  . DDD (degenerative disc disease), lumbar 12/16/2014  . Lumbar facet syndrome (Bilateral) 12/16/2014  . Sacroiliac joint dysfunction  12/16/2014  . Neuropathy due to secondary diabetes (HCC) 12/16/2014  . Vitamin D deficiency 12/13/2014  . Morbid obesity (HCC) 12/10/2014  . Type 2 diabetes mellitus with diabetic neuropathy, unspecified (HCC) 12/10/2014  . Hyperlipidemia 12/10/2014  . Gastroesophageal reflux disease without esophagitis 12/10/2014   Care Plan : General Social Work (Adult)  Updates made by Gustavus Bryant, LCSW since 06/20/2020 12:00 AM    Problem: Quality of Life (General Plan of Care)     Long-Range Goal: Quality of Life Maintained   Start Date: 06/20/2020  Priority: Medium  Note:   Timeframe:  Long-Range Goal Priority:  Medium Start Date:   06/20/20                        Expected End Date:  09/17/20                     Follow Up Date- 08/11/20   - arrange a ride through an agency 1 week before appointment - ask family or friend for a ride - call to cancel if needed - keep a calendar with prescription refill dates - keep a calendar with appointment dates - learn the bus route - use public transportation    Why is this important?    Part of staying healthy is seeing the doctor for follow-up care.   If you forget your appointments,  there are some things you can do to stay on track.    CARE PLAN ENTRY (see longitudinal plan of care for additional care plan information)  Current Barriers:  . Patient In need of assistance with connection to community resources in order to attend needed appointments  . Knowledge deficits and need for support, education and care coordination related to community resources support  . ADL IADL limitations and Social Isolation  Clinical Goal(s)  . Over the next 120 days patient will be contact [facility] as advised by LCSW . Over the next 120 days, patient will work with care management team member to address concerns related to gaining mammogram appointment  . Over the next 120 days, patient will work with LCSW to address  needs related to lack of self care . Over the next 120 days, patient will implement appropriate coping skills to improve stress management   Interventions provided by LCSW:  . Assessed patient's care coordination needs related to needing a mammogram appointment and discussed ongoing care management follow up  . Patient reports that PCP put in order for a mammogram but when she used the link on MyChart and put in her zip code it was asking her to choose locations in [place] or [place] instead of [place]. Patient reports that this happened last year. Patient asked CCM LCSW to notify PCP regarding this concern. CCM LCSW informed patient that PCP put in the order her preferred location as [facility]. CCM LCSW provided patient with information about [facility]. Patient is agreeable to contact them today.  . Patient reports that she wishes for CCM LCSW to update PCP on her lyrica medication as well. She reports that this medication is making her extremely tired and she is wondering if she should discontinue medication and get back on a lower dose of gabapentin.  . Patient is actively involved with HH PT.  Marland Kitchen Advised patient to implement appropriate self-care and stress management coping skills into her daily routine* . Collaborated with appropriate clinical care team members regarding patient needs . Patient interviewed and appropriate assessments performed . Discussed plans with patient for ongoing care management follow up and provided patient with direct contact information for care management team . Assisted patient/caregiver with obtaining information about health plan benefits . Provided education and assistance to client regarding Advanced Directives. . Mindfulness or Relaxation Training, Psychoeducation and/or Health Education, and Problem Solving  Patient Self Care Activities & Deficits:  . Patient is unable to independently navigate community  resource options without care coordination support  . Acknowledges deficits and is motivated to resolve concern  . Patient is able to contact [facility]  as discussed today . Lacks social connections . Calls provider office for new concerns or questions, Ability for insight, Independent living, and Motivation for treatment  Initial goal documentation   Task: Support and Maintain Acceptable Degree of Health, Comfort and Happiness   Note:   Care Management Activities:    - affirmation provided - community involvement promoted - expression of thoughts about present/future encouraged - independence in all possible areas promoted - life review by storytelling encouraged - patient strengths promoted - psychosocial concerns monitored - self-expression encouraged - sleep diary encouraged - sleep hygiene techniques encouraged - social relationships promoted - strategies to maintain hearing and/or vision promoted - strategies to maintain intimacy promoted - wellness behaviors promoted    Notes:       Follow  Up Plan: SW will follow up with patient by phone over the next quarter  Dickie LaBrooke Lindamarie Maclachlan, BSW, MSW, LCSW [facility] Care Management [place]  [facility] [email or web address] Phone: [phone]

## 2020-06-23 DIAGNOSIS — Z7984 Long term (current) use of oral hypoglycemic drugs: Secondary | ICD-10-CM | POA: Diagnosis not present

## 2020-06-23 DIAGNOSIS — G20 Parkinson's disease: Secondary | ICD-10-CM | POA: Diagnosis not present

## 2020-06-23 DIAGNOSIS — G4733 Obstructive sleep apnea (adult) (pediatric): Secondary | ICD-10-CM | POA: Diagnosis not present

## 2020-06-23 DIAGNOSIS — Z9181 History of falling: Secondary | ICD-10-CM | POA: Diagnosis not present

## 2020-06-23 DIAGNOSIS — Z791 Long term (current) use of non-steroidal anti-inflammatories (NSAID): Secondary | ICD-10-CM | POA: Diagnosis not present

## 2020-06-23 DIAGNOSIS — G2581 Restless legs syndrome: Secondary | ICD-10-CM | POA: Diagnosis not present

## 2020-06-23 DIAGNOSIS — G44329 Chronic post-traumatic headache, not intractable: Secondary | ICD-10-CM | POA: Diagnosis not present

## 2020-06-23 DIAGNOSIS — E1142 Type 2 diabetes mellitus with diabetic polyneuropathy: Secondary | ICD-10-CM | POA: Diagnosis not present

## 2020-06-23 DIAGNOSIS — Z792 Long term (current) use of antibiotics: Secondary | ICD-10-CM | POA: Diagnosis not present

## 2020-06-23 DIAGNOSIS — I10 Essential (primary) hypertension: Secondary | ICD-10-CM | POA: Diagnosis not present

## 2020-06-23 DIAGNOSIS — K219 Gastro-esophageal reflux disease without esophagitis: Secondary | ICD-10-CM | POA: Diagnosis not present

## 2020-06-23 DIAGNOSIS — E538 Deficiency of other specified B group vitamins: Secondary | ICD-10-CM | POA: Diagnosis not present

## 2020-06-23 DIAGNOSIS — Z6833 Body mass index (BMI) 33.0-33.9, adult: Secondary | ICD-10-CM | POA: Diagnosis not present

## 2020-06-23 DIAGNOSIS — E782 Mixed hyperlipidemia: Secondary | ICD-10-CM | POA: Diagnosis not present

## 2020-06-30 DIAGNOSIS — Z9181 History of falling: Secondary | ICD-10-CM | POA: Diagnosis not present

## 2020-06-30 DIAGNOSIS — Z792 Long term (current) use of antibiotics: Secondary | ICD-10-CM | POA: Diagnosis not present

## 2020-06-30 DIAGNOSIS — Z791 Long term (current) use of non-steroidal anti-inflammatories (NSAID): Secondary | ICD-10-CM | POA: Diagnosis not present

## 2020-06-30 DIAGNOSIS — G44329 Chronic post-traumatic headache, not intractable: Secondary | ICD-10-CM | POA: Diagnosis not present

## 2020-06-30 DIAGNOSIS — E782 Mixed hyperlipidemia: Secondary | ICD-10-CM | POA: Diagnosis not present

## 2020-06-30 DIAGNOSIS — I10 Essential (primary) hypertension: Secondary | ICD-10-CM | POA: Diagnosis not present

## 2020-06-30 DIAGNOSIS — Z6833 Body mass index (BMI) 33.0-33.9, adult: Secondary | ICD-10-CM | POA: Diagnosis not present

## 2020-06-30 DIAGNOSIS — G4733 Obstructive sleep apnea (adult) (pediatric): Secondary | ICD-10-CM | POA: Diagnosis not present

## 2020-06-30 DIAGNOSIS — G20 Parkinson's disease: Secondary | ICD-10-CM | POA: Diagnosis not present

## 2020-06-30 DIAGNOSIS — E1142 Type 2 diabetes mellitus with diabetic polyneuropathy: Secondary | ICD-10-CM | POA: Diagnosis not present

## 2020-06-30 DIAGNOSIS — E538 Deficiency of other specified B group vitamins: Secondary | ICD-10-CM | POA: Diagnosis not present

## 2020-06-30 DIAGNOSIS — G2581 Restless legs syndrome: Secondary | ICD-10-CM | POA: Diagnosis not present

## 2020-06-30 DIAGNOSIS — Z7984 Long term (current) use of oral hypoglycemic drugs: Secondary | ICD-10-CM | POA: Diagnosis not present

## 2020-06-30 DIAGNOSIS — K219 Gastro-esophageal reflux disease without esophagitis: Secondary | ICD-10-CM | POA: Diagnosis not present

## 2020-07-12 DIAGNOSIS — I700 Atherosclerosis of aorta: Secondary | ICD-10-CM | POA: Insufficient documentation

## 2020-07-15 ENCOUNTER — Other Ambulatory Visit: Payer: Self-pay

## 2020-07-15 ENCOUNTER — Ambulatory Visit: Payer: BLUE CROSS/BLUE SHIELD | Admitting: Family Medicine

## 2020-07-15 ENCOUNTER — Other Ambulatory Visit: Payer: Self-pay | Admitting: Family Medicine

## 2020-07-15 ENCOUNTER — Encounter: Payer: Self-pay | Admitting: Family Medicine

## 2020-07-15 VITALS — BP 100/67 | HR 97 | Temp 97.7°F | Wt 187.0 lb

## 2020-07-15 DIAGNOSIS — N644 Mastodynia: Secondary | ICD-10-CM

## 2020-07-15 DIAGNOSIS — I10 Essential (primary) hypertension: Secondary | ICD-10-CM

## 2020-07-15 DIAGNOSIS — M792 Neuralgia and neuritis, unspecified: Secondary | ICD-10-CM | POA: Diagnosis not present

## 2020-07-15 MED ORDER — GABAPENTIN 100 MG PO CAPS: 100.0000 mg | ORAL_CAPSULE | Freq: Three times a day (TID) | ORAL | 3 refills | Status: DC

## 2020-07-15 NOTE — Patient Instructions (Addendum)
50mg  lyrica in PM and bedtime for 3-4 days, then 50mg  at bedtime for 3-4 days, then stop  [facility]  Address: [address] [place], [place], [place]  Phone: [phone]  Pectoralis Major Tear Rehab Ask your health care provider which exercises are safe for you. Do exercises exactly as told by your health care provider and adjust them as directed. It is normal to feel mild stretching, pulling, tightness, or discomfort as you do these exercises. Stop right away if you feel sudden pain or your pain gets worse. Do not begin these exercises until told by your health care provider. Stretching and range-of-motion exercises These exercises warm up your muscles and joints and improve the movement and flexibility of your shoulder. These exercises can also help to relieve pain, numbness, and tingling. Pendulum This is a shoulder exercise in which you let the injured arm dangle toward the floor and then swing it like a clock pendulum. 1. Stand near a wall or a surface that you can hold onto for balance. 2. Bend at the waist and let your left / right arm hang straight down. Use your other arm to keep your balance. 3. Relax your arm and shoulder muscles, and move your hips and your trunk so your left / right arm swings freely. Your arm should swing because of the motion of your body, not because you are using your arm or shoulder muscles. 4. Keep moving your hips and trunk so your arm swings in the following directions, as told by your health care provider: ? Side to side. ? Forward and backward. ? In clockwise and counterclockwise circles. 5. Slowly return to the starting position. Repeat __________ times. Complete this exercise __________ times a day.   Standing shoulder abduction, passive In this exercise, the injured shoulder relaxes (passive) while you use the healthy arm to push it away from your body (abduction). 1. Stand and hold a broomstick, a cane, or  a similar object. Place your hands a little more than shoulder width apart on the object. Your left / right hand should be palm-up, and your other hand should be palm-down. 2. While keeping your elbow straight and your shoulder muscles relaxed, push the stick across your body toward your left / right side. Raise your left / right arm to the side of your body and then over your head until you feel a stretch in your shoulder. ? Stop when you reach the angle that is recommended by your health care provider. ? Avoid shrugging your shoulder while you raise your arm. Keep your shoulder blade tucked down toward the middle of your spine. 3. Hold for __________ seconds. 4. Slowly return to the starting position. Repeat __________ times. Complete this exercise __________ times a day. Supine wand shoulder flexion, passive In this exercise, the injured shoulder relaxes (passive) while you use the healthy arm to move it (flexion). 1. Lie on your back (supine position). You may bend your knees for comfort. 2. Hold a broomstick, a cane, or a similar object so that your hands are about shoulder width apart on the object. Your palms should face toward your feet. 3. Raise your left / right arm in front of your face, then behind your head (toward the floor). Use your other hand to help you do this. Stop when you feel a gentle stretch in your shoulder, or when you reach the angle that is recommended by your health care provider. 4. Hold for __________  seconds. 5. Use the stick and your other arm to help you return your left / right arm to the starting position. Repeat __________ times. Complete this exercise __________ times a day.   Wand shoulder external rotation, passive In this exercise, the injured shoulder relaxes (passive) while you use the healthy arm to push it away to your side (external rotation). 1. Stand and hold a broomstick, a cane, or a similar object so your hands are about shoulder width apart on the  object. 2. Start with your arms hanging down, then bend both elbows to a 90-degree angle (right angle). 3. Keep your left / right elbow at your side. Use your other hand to push the stick so your left / right forearm moves away from your body, out to your side. ? Keep your left / right elbow bent to 90 degrees and keep it against your side. ? Stop when you feel a gentle stretch in your shoulder, or when you reach the angle recommended by your health care provider. 4. Hold for __________ seconds. 5. Use the stick to help you return your left / right arm to the starting position. Repeat __________ times. Complete this exercise __________ times a day. Strengthening exercises These exercises build strength and endurance in your shoulder. Endurance is the ability to use your muscles for a long time, even after your muscles get tired. Scapular protraction, standing 1. Stand so you are facing a wall. Place your feet about one arm-length away from the wall. 2. Place your hands on the wall and straighten your elbows. 3. Keep your hands on the wall as you push your upper back away from the wall. You should feel your shoulder blades (scapulae) sliding forward (protraction) around your rib cage. Keep your elbows and your head still. ? If you are not sure that you are doing this exercise correctly, ask your health care provider for more instructions. 4. Hold for __________ seconds. 5. Slowly return to the starting position. Let your muscles relax completely before you repeat this exercise. Repeat __________ times. Complete this exercise __________ times a day.   Scapular retraction, seated This exercise is also called shoulder blade squeezes. 1. Sit with good posture in a stable chair without armrests. Do not let your back touch the back of the chair. 2. Your arms should be at your sides with your elbows bent. You may rest your forearms on a pillow if that is more comfortable. 3. Squeeze your shoulder  blades (scapulae) together. Bring them down and back (retraction). ? Keep your shoulders level. ? Do not lift your shoulders up toward your ears. 4. Hold for __________ seconds. 5. Return to the starting position. Repeat __________ times. Complete this exercise __________ times a day. This information is not intended to replace advice given to you by your health care provider. Make sure you discuss any questions you have with your health care provider. Document Revised: 07/31/2018 Document Reviewed: 04/07/2018 Elsevier Patient Education  2021 [employer].

## 2020-07-15 NOTE — Telephone Encounter (Signed)
   Notes to clinic: Patient has appointment today    Requested Prescriptions  Pending Prescriptions Disp Refills   amLODipine (NORVASC) 5 MG tablet [Pharmacy Med Name: AMLODIPINE BESYLATE 5MG  TABLETS] 90 tablet 1    Sig: TAKE 1 TABLET(5 MG) BY MOUTH DAILY      Cardiovascular:  Calcium Channel Blockers Passed - 07/15/2020  8:32 AM      Passed - Last BP in normal range    BP Readings from Last 1 Encounters:  05/23/20 114/81          Passed - Valid encounter within last 6 months    Recent Outpatient Visits           1 month ago Type 2 diabetes mellitus with diabetic neuropathy, without long-term current use of insulin (HCC)   [facility] [place], Megan P, DO   4 months ago Routine general medical examination at a health care facility   [facility] P, DO   9 months ago Type 2 diabetes mellitus with diabetic neuropathy, without long-term current use of insulin (HCC)   [facility] [place], Megan P, DO   12 months ago Closed bimalleolar fracture of left ankle with routine healing, subsequent encounter   [facility] [place], Megan P, DO   1 year ago Essential hypertension   [facility] [place], [place], DO       Future Appointments             Today Oralia Rud, DO [facility], [facility]   In 1 month [place], [place], DO Oralia Rud, [facility]

## 2020-07-15 NOTE — Assessment & Plan Note (Signed)
Not tolerating her lyrica. Will titrate off and go back up on her gabapentin. Call with any concerns. Continue to monitor.

## 2020-07-15 NOTE — Progress Notes (Signed)
BP 100/67   Pulse 97   Temp 97.7 F (36.5 C)   Wt 187 lb (84.8 kg)   SpO2 98%   BMI 33.13 kg/m    Subjective:    Patient ID: Lisa Galvan, female    DOB: [birth year], 60 y.o.   MRN: [id number]  HPI: Lisa Galvan is a 60 y.o. female  Chief Complaint  Patient presents with  . Breast Problem    Patient states she is having soreness in both breast, more on the right than on the left. Patient was trying to schedule mammogram and was told to see her PCP first for the soreness.   . Medication Problem    Patient states she is not tolerating the lyrica well and would like to go back to gabapentin    BREAST PAIN Duration: Months Location: bilateral- upper outer quadrants Onset: gradual Severity: mild Quality: aching and sore Frequency: constant Redness: no Swelling: no Trauma: no trauma Breastfeeding: no Associated with menstral cycle: N/A Nipple discharge: no Breast lump: no Status: stable Treatments attempted: none Previous mammogram: yes  Not tolerating her lyrica. Making her face puffy- she would like to go back to her gabapentin. Her feet are feeling well, but she's not feeling well on her medicine. No other concerns or complaints at this time.   Relevant past medical, surgical, family and social history reviewed and updated as indicated. Interim medical history since our last visit reviewed. Allergies and medications reviewed and updated.  Review of Systems  Constitutional: Negative.   Respiratory: Negative.   Cardiovascular: Negative.   Gastrointestinal: Negative.   Musculoskeletal: Negative.   Psychiatric/Behavioral: Negative.     Per HPI unless specifically indicated above     Objective:    BP 100/67   Pulse 97   Temp 97.7 F (36.5 C)   Wt 187 lb (84.8 kg)   SpO2 98%   BMI 33.13 kg/m   Wt Readings from Last 3 Encounters:  07/15/20 187 lb (84.8 kg)  05/23/20 190 lb (86.2 kg)  02/18/20 188 lb (85.3 kg)    Physical Exam Vitals and nursing note  reviewed. Exam conducted with a chaperone present.  Constitutional:      General: She is not in acute distress.    Appearance: Normal appearance. She is not ill-appearing, toxic-appearing or diaphoretic.  HENT:     Head: Normocephalic and atraumatic.     Right Ear: External ear normal.     Left Ear: External ear normal.     Nose: Nose normal.     Mouth/Throat:     Mouth: Mucous membranes are moist.     Pharynx: Oropharynx is clear.  Eyes:     General: No scleral icterus.       Right eye: No discharge.        Left eye: No discharge.     Extraocular Movements: Extraocular movements intact.     Conjunctiva/sclera: Conjunctivae normal.     Pupils: Pupils are equal, round, and reactive to light.  Cardiovascular:     Rate and Rhythm: Normal rate and regular rhythm.     Pulses: Normal pulses.     Heart sounds: Normal heart sounds. No murmur heard. No friction rub. No gallop.   Pulmonary:     Effort: Pulmonary effort is normal. No respiratory distress.     Breath sounds: Normal breath sounds. No stridor. No wheezing, rhonchi or rales.  Chest:     Chest wall: No tenderness.  Breasts:     Right:  Normal. No swelling, bleeding, inverted nipple, mass, nipple discharge, skin change or tenderness.     Left: Normal. No swelling, bleeding, inverted nipple, mass, nipple discharge, skin change or tenderness.    Musculoskeletal:        General: Normal range of motion.     Cervical back: Normal range of motion and neck supple.  Skin:    General: Skin is warm and dry.     Capillary Refill: Capillary refill takes less than 2 seconds.     Coloration: Skin is not jaundiced or pale.     Findings: No bruising, erythema, lesion or rash.  Neurological:     General: No focal deficit present.     Mental Status: She is alert and oriented to person, place, and time. Mental status is at baseline.  Psychiatric:        Mood and Affect: Mood normal.        Behavior: Behavior normal.        Thought  Content: Thought content normal.        Judgment: Judgment normal.     Results for orders placed or performed in visit on 05/23/20  Bayer DCA Hb A1c Waived  Result Value Ref Range   HB A1C (BAYER DCA - WAIVED) 7.7 (H) <7.0 %      Assessment & Plan:   Problem List Items Addressed This Visit      Other   Neurogenic pain (Chronic)    Not tolerating her lyrica. Will titrate off and go back up on her gabapentin. Call with any concerns. Continue to monitor.        Other Visit Diagnoses    Breast pain    -  Primary   Likely pec hypertonicity. Will get her mammogram. Stretches given. Follow up 1 month.    Relevant Orders   MM DIAG BREAST TOMO BILATERAL   US BREAST LTD UNI LEFT INC AXILLA   US BREAST LTD UNI RIGHT INC AXILLA       Follow up plan: Return as scheduled.

## 2020-07-26 ENCOUNTER — Ambulatory Visit
Admission: RE | Admit: 2020-07-26 | Discharge: 2020-07-26 | Disposition: A | Discharge status: Home / Self Care | Payer: BLUE CROSS/BLUE SHIELD | Source: Ambulatory Visit | Attending: Family Medicine | Admitting: Family Medicine

## 2020-07-26 ENCOUNTER — Other Ambulatory Visit: Payer: Self-pay

## 2020-07-26 DIAGNOSIS — N644 Mastodynia: Secondary | ICD-10-CM | POA: Insufficient documentation

## 2020-07-26 DIAGNOSIS — R922 Inconclusive mammogram: Secondary | ICD-10-CM | POA: Diagnosis not present

## 2020-08-02 ENCOUNTER — Ambulatory Visit: Payer: Self-pay | Admitting: General Practice

## 2020-08-02 ENCOUNTER — Telehealth: Payer: Self-pay | Admitting: General Practice

## 2020-08-02 DIAGNOSIS — I10 Essential (primary) hypertension: Secondary | ICD-10-CM

## 2020-08-02 DIAGNOSIS — E1140 Type 2 diabetes mellitus with diabetic neuropathy, unspecified: Secondary | ICD-10-CM

## 2020-08-02 DIAGNOSIS — E785 Hyperlipidemia, unspecified: Secondary | ICD-10-CM

## 2020-08-02 DIAGNOSIS — N644 Mastodynia: Secondary | ICD-10-CM

## 2020-08-02 NOTE — Chronic Care Management (AMB) (Signed)
Care Management    RN Visit Note  08/02/2020 Name: Lisa Galvan MRN: [id number] DOB: July 15, 1960  Subjective: Lisa Galvan is a 60 y.o. year old female who is a primary care patient of [name], DO. The care management team was consulted for assistance with disease management and care coordination needs.    Engaged with patient by telephone for follow up visit in response to provider referral for case management and/or care coordination services.   Consent to Services:   Ms. Ballowe was given information about Care Management services today including:  1. Care Management services includes personalized support from designated clinical staff supervised by her physician, including individualized plan of care and coordination with other care providers 2. 24/7 contact phone numbers for assistance for urgent and routine care needs. 3. The patient may stop case management services at any time by phone call to the office staff.  Patient agreed to services and consent obtained.   Assessment: Review of patient past medical history, allergies, medications, health status, including review of consultants reports, laboratory and other test data, was performed as part of comprehensive evaluation and provision of chronic care management services.   SDOH (Social Determinants of Health) assessments and interventions performed:    Care Plan  Allergies  Allergen Reactions  . Amlodipine Besy-Benazepril Hcl Anaphylaxis  . Ivp Dye [Iodinated Diagnostic Agents] Anaphylaxis    Pt is unaware   . Shellfish Allergy Swelling and Anaphylaxis  . Shellfish-Derived Products Anaphylaxis  . Ace Inhibitors Swelling  . Lyrica [Pregabalin] Swelling    Outpatient Encounter Medications as of 08/02/2020  Medication Sig  . Accu-Chek Softclix Lancets lancets 2 (two) times daily. use for testing  . ALLERGY RELIEF 180 MG tablet TAKE 1 TABLET BY MOUTH EVERY DAY  . amLODipine (NORVASC) 5 MG tablet TAKE 1 TABLET(5 MG) BY  MOUTH DAILY  . aspirin EC 81 MG tablet Take 81 mg by mouth daily. Swallow whole.  Marland Kitchen atorvastatin (LIPITOR) 20 MG tablet Take 1 tablet (20 mg total) by mouth daily.  . Cholecalciferol (VITAMIN D-3) 5000 units TABS Take 5,000 Units by mouth daily.  . Cyanocobalamin (VITAMIN B-12 PO) Take 1 tablet by mouth 2 (two) times daily.   . DULoxetine (CYMBALTA) 60 MG capsule Take 1 capsule (60 mg total) by mouth daily.  . empagliflozin (JARDIANCE) 25 MG TABS tablet Take 1 tablet (25 mg total) by mouth daily.  Marland Kitchen etodolac (LODINE) 400 MG tablet Take by mouth.  . gabapentin (NEURONTIN) 100 MG capsule Take 1 capsule (100 mg total) by mouth 3 (three) times daily.  . hydrochlorothiazide (HYDRODIURIL) 25 MG tablet Take 1 tablet (25 mg total) by mouth daily.  Marland Kitchen ketoconazole (NIZORAL) 2 % cream APPLY TOPICALLY TO THE AFFECTED AREA DAILY  . Magnesium Oxide 500 MG CAPS Take 1 capsule (500 mg total) by mouth in the morning.  . metFORMIN (GLUCOPHAGE) 500 MG tablet Take 1 tablet (500 mg total) by mouth 2 (two) times daily with a meal.  . montelukast (SINGULAIR) 10 MG tablet TAKE 1 TABLET(10 MG) BY MOUTH AT BEDTIME  . omeprazole (PRILOSEC) 40 MG capsule Take 40 mg by mouth 2 (two) times daily.  . pregabalin (LYRICA) 50 MG capsule Take 1 capsule (50 mg total) by mouth 2 (two) times daily AND 1-2 capsules (50-100 mg total) at bedtime.  . sucralfate (CARAFATE) 1 g tablet Take 1 tablet (1 g total) by mouth 3 (three) times daily.  Marland Kitchen triamcinolone ointment (KENALOG) 0.5 % Apply 1 application topically 2 (  two) times daily.  . valACYclovir (VALTREX) 1000 MG tablet Take 1 tablet (1,000 mg total) by mouth 2 (two) times daily.   No facility-administered encounter medications on file as of 08/02/2020.    Patient Active Problem List   Diagnosis Date Noted  . Aortic atherosclerosis ([place]) 07/12/2020  . OSA (obstructive sleep apnea) 05/23/2020  . Coccygodynia 02/01/2020  . Facial swelling 07/21/2019  . Acute postoperative pain  06/30/2019  . History of allergy to radiographic contrast media 06/02/2019  . History of allergy to shellfish 06/02/2019  . History of Allergy to iodine 06/02/2019    Class: History of  . History of anaphylaxis 06/02/2019  . Other spondylosis, sacral and sacrococcygeal region 04/21/2019  . Abnormal MRI, cervical spine (02/08/2019) 03/25/2019  . Lower extremity weakness (Bilateral) 03/25/2019  . Coordination impairment (lower extremity) 03/25/2019  . Hypomagnesemia 03/24/2019  . Spondylosis without myelopathy or radiculopathy, lumbosacral region 03/24/2019  . Lumbar Facet Hypertrophy 03/24/2019  . Grade 1 Anterolisthesis of L4/5 03/11/2019  . Chronic hip pain (Bilateral) (R>L) 03/11/2019  . Chronic sacroiliac joint pain (Bilateral) 03/11/2019  . Sacroiliac joint somatic dysfunction (Bilateral) 03/11/2019  . Chronic feet pain (Primary Area of Pain) (Bilateral) 03/11/2019  . Chronic leg and foot pain (Bilateral) 03/11/2019  . Diabetic peripheral neuropathy ([place]) 03/11/2019  . Chronic neuropathic pain 03/11/2019  . Neurogenic pain 03/11/2019  . Chronic musculoskeletal pain 03/11/2019  . Osteoarthritis involving multiple joints 03/11/2019  . Chronic pain syndrome 03/10/2019  . Pharmacologic therapy 03/10/2019  . Disorder of skeletal system 03/10/2019  . Problems influencing health status 03/10/2019  . Chronic low back pain (Third area of Pain) (Bilateral) w/o sciatica 03/10/2019  . Chronic lower extremity pain (Secondary area of Pain) (Bilateral) 03/10/2019  . Abnormal MRI, lumbar spine (02/08/2019) 03/10/2019  . Essential hypertension 10/06/2018  . Cholelithiasis 06/23/2018  . Fatty liver 06/23/2018  . Helicobacter pylori infection 05/13/2017  . Mastalgia in female 04/09/2017  . Bilateral leg edema 07/02/2016  . DDD (degenerative disc disease), lumbar 12/16/2014  . Lumbar facet syndrome (Bilateral) 12/16/2014  . Sacroiliac joint dysfunction 12/16/2014  . Neuropathy due to  secondary diabetes ([place]) 12/16/2014  . Vitamin D deficiency 12/13/2014  . Morbid obesity ([place]) 12/10/2014  . Type 2 diabetes mellitus with diabetic neuropathy, unspecified ([place]) 12/10/2014  . Hyperlipidemia 12/10/2014  . Gastroesophageal reflux disease without esophagitis 12/10/2014    Conditions to be addressed/monitored: HTN, HLD and DMII  Care Plan : RNCM: Diabetes Type 2 (Adult)  Updates made by Vanita Ingles since 08/02/2020 12:00 AM    Problem: RNCM: Glycemic Management (Diabetes, Type 2)   Priority: Medium    Long-Range Goal: RNCM: Glycemic Management Optimized   Priority: Medium  Note:   Objective:  . Lab Results .  Component . Value . Date .   Marland Kitchen HGBA1C . 7.7 (H) . 05/23/2020 .    Marland Kitchen Lab Results .  Component . Value . Date .   Marland Kitchen CREATININE . 0.83 . 02/18/2020 .    Marland Kitchen No results found for: EGFR Current Barriers:  Marland Kitchen Knowledge Deficits related to basic Diabetes pathophysiology and self care/management . Knowledge Deficits related to medications used for management of diabetes . Film/video editor . Limited Social Support . Unable to independently manage DM . Lacks social connections . Does not maintain contact with provider office . Does not contact provider office for questions/concerns Case Manager Clinical Goal(s):  Marland Kitchen Collaboration with [name], DO regarding development and update of comprehensive plan of care  as evidenced by provider attestation and co-signature . Inter-disciplinary care team collaboration (see longitudinal plan of care) . Over the next 120 days, patient will demonstrate improved adherence to prescribed treatment plan for diabetes self care/management as evidenced by:  . daily monitoring and recording of CBG  . adherence to ADA/ carb modified diet . adherence to prescribed medication regimen Interventions:  . Provided education to patient about basic DM disease process . Reviewed medications with patient and discussed importance of  medication adherence. 08-02-2020: Reviewed her change in Jardiance and the patient is tolerating well. Denies any issues at this time. . Discussed plans with patient for ongoing care management follow up and provided patient with direct contact information for care management team . Provided patient with written educational materials related to hypo and hyperglycemia and importance of correct treatment.  08-02-2020: The patient denies any episodes of low blood sugars. Average around 119 . Reviewed scheduled/upcoming provider appointments including: 08-23-2020 at [place] am . Advised patient, providing education and rationale, to check cbg BID and record, calling pcp for findings outside established parameters.  08-02-2020: States blood sugars are better since medication changes. The patient verbalized this am her blood sugar was 119.. . Referral made to pharmacy team for assistance with help with letter she received about "MIV" and needing documentation that she is not on insulin and other diagnosis information.  The patient to send e-mail with information to the [facility] will forward to CCM team pharmacist. 06-07-2020: The patient is still working on paperwork. Has what she needs from the office. 08-02-2020: The patient has found and insurance company that she likes. She has what she needs currently  . Collaboration with the pcp and CCM pharmacist concerning needing documentation that she needs a letter stating she does not have multiple heart conditions and she is not insulin dependent. Will send in basket message to the pcp and pharmacist for assistance. Did advise the patient that she could copy her last office visit note off of my Chart that had her diagnosis and medications listed. 06-07-2020: The patient is working with pharmacist. 08-02-2020: Ongoing support from pharm D. No new needs at this time.  . Review of patient status, including review of consultants reports, relevant laboratory and other test  results, and medications completed. . In basket message to Dr. Wynetta Emery and clinical staff at [facility] to make a referral to the breast center in [place] for annual mammogram. 08-02-2020: The patient states that she had her mammogram and she is doing well. She says the radiologist feels that it is fatty tissue and recommended that she take Vitamin E. She has not started the Vitamin E yet.  Patient Goals/Self-Care Activities . Over the next 120 days, patient will:  - UNABLE to independently manage DM Attends all scheduled provider appointments Checks blood sugars as prescribed and utilize hyper and hypoglycemia protocol as needed Adheres to prescribed ADA/carb modified. 08-02-2020: The patient is working on dietary changes.  The RNCM sending planning healthy meals document via mail to the patient.  - barriers to adherence to treatment plan identified - blood glucose monitoring encouraged - blood glucose readings reviewed.  08-02-2020: Average readings 119 - individualized medical nutrition therapy provided - mutual A1C goal set or reviewed - resources required to improve adherence to care identified - self-awareness of signs/symptoms of hypo or hyperglycemia encouraged - use of blood glucose monitoring log promoted Follow Up Plan: Telephone follow up appointment with care management team member scheduled for: 10-11-2020 at  0945 am   Care Plan : RNCM: Hypertension (Adult)  Updates made by Vanita Ingles since 08/02/2020 12:00 AM    Problem: RNCM: Hypertension (Hypertension)   Priority: Medium    Long-Range Goal: RNCM: Hypertension Monitored   Priority: Medium  Note:   Objective:  . Last practice recorded BP readings:  . BP Readings from Last 3 Encounters: .  07/15/20 . 100/67 .  05/23/20 . 114/81 .  02/18/20 . 114/83 .     Marland Kitchen Most recent eGFR/CrCl: No results found for: EGFR  No components found for: CRCL Current Barriers:  Marland Kitchen Knowledge Deficits related to basic understanding of hypertension  pathophysiology and self care management . Knowledge Deficits related to understanding of medications prescribed for management of hypertension . Limited Social Support . Film/video editor.  . Unable to independently manage HTN . Lacks social connections . Does not maintain contact with provider office . Does not contact provider office for questions/concerns Case Manager Clinical Goal(s):  Marland Kitchen Over the next 120 days, patient will verbalize understanding of plan for hypertension management . Over the next 120 days, patient will attend all scheduled medical appointments: 05-23-2020 at 2:20 pm . Over the next 120 days, patient will demonstrate improved adherence to prescribed treatment plan for hypertension as evidenced by taking all medications as prescribed, monitoring and recording blood pressure as directed, adhering to low sodium/DASH diet . Over the next 120 days, patient will demonstrate improved health management independence as evidenced by checking blood pressure as directed and notifying PCP if SBP>160 or DBP > 90, taking all medications as prescribe, and adhering to a low sodium diet as discussed. . Over the next 120 days, patient will verbalize basic understanding of hypertension disease process and self health management plan as evidenced by compliance with the plan of care, taking medications as ordered, and working with the CCM team to manage health and well being Interventions:  . Collaboration with [name], DO regarding development and update of comprehensive plan of care as evidenced by provider attestation and co-signature . Inter-disciplinary care team collaboration (see longitudinal plan of care) . UNABLE to independently:manage HTN . Evaluation of current treatment plan related to hypertension self management and patient's adherence to plan as established by provider. 08-02-2020: The patient states her blood pressure is doing good. Denies any lows. The patient states  that she takes it sometimes at home.  . Provided education to patient re: stroke prevention, s/s of heart attack and stroke, DASH diet, complications of uncontrolled blood pressure. 06-07-2020: Review of the benefits of a heart healthy/ADA diet. Information sent to the patient via mail. 08-02-2020: The patient received information is looking forward to the fresh fruits and vegetables from the garden. Does not have an appetite and this has been over the last month. She has lost weight but has not weighed lately. Denies issues with dehydration.  . Reviewed medications with patient and discussed importance of compliance. 08-02-2020: The patient is compliant with medications. Is coming by the office today to pick up a pill box to use at home. Will also provide information on Auvi-Q.  The patient stated that she could not afford 165.00 for an EPI pen. Auvi-Q is a program that allows patients to get EPI pen for 25.00.  Marland Kitchen Discussed plans with patient for ongoing care management follow up and provided patient with direct contact information for care management team . Advised patient, providing education and rationale, to monitor blood pressure daily and record, calling PCP for  findings outside established parameters.  . Reviewed scheduled/upcoming provider appointments including: 08-23-2020 at [place] am Patient Goals/Self-Care Activities . Over the next 120 days, patient will:  - UNABLE to independently manage HTN Calls provider office for new concerns, questions, or BP outside discussed parameters Checks BP and records as discussed Follows a low sodium diet/DASH diet - blood pressure trends reviewed - depression screen reviewed - home or ambulatory blood pressure monitoring encouraged Follow Up Plan: Telephone follow up appointment with care management team member scheduled for: 10-11-2020 at 0945 am   Care Plan : RNCM: Management of HLD  Updates made by Vanita Ingles since 08/02/2020 12:00 AM    Problem: RNCM:  Management of HLD   Priority: Medium    Goal: RNCM: Managment of HLD   Priority: Medium  Note:   Current Barriers:  . Poorly controlled hyperlipidemia, complicated by HTN, DM . Current antihyperlipidemic regimen: Lipitor 20 mg QD . Most recent lipid panel:  . Lab Results .  Component . Value . Date .   Marland Kitchen CHOL . 207 (H) . 02/18/2020 .   Marland Kitchen HDL . 122 . 02/18/2020 .   Marland Kitchen [place] . 69 . 02/18/2020 .   Marland Kitchen TRIG . 97 . 02/18/2020 .   Marland Kitchen CHOLHDL . 1.4 . 01/03/2017 .    Marland Kitchen ASCVD risk enhancing conditions: age 38 DM, HTN . Unable to independently manage HLD . Lacks social connections . Does not maintain contact with provider office . Does not contact provider office for questions/concerns  RN Care Manager Clinical Goal(s):  Marland Kitchen Over the next 120 days, patient will work with Consulting civil engineer, providers, and care team towards execution of optimized self-health management plan . Over the next 120 days, patient will verbalize understanding of plan for working with the CCM team to meet health and wellness needs . Over the next 120 days, patient will work with [facility], pharmacist and pcp to address needs related to expressed needs for paperwork with documentation about chronic conditions and medicaitons  . Over the next 120 days, patient will attend all scheduled medical appointments: 08-23-2020 at 0840 am . Over the next 30 days, patient will work with CM team pharmacist to get needed documentation expressed in the call today.   Interventions: . Collaboration with [name], DO regarding development and update of comprehensive plan of care as evidenced by provider attestation and co-signature . Inter-disciplinary care team collaboration (see longitudinal plan of care) . Medication review performed; medication list updated in electronic medical record.  Bertram Savin care team collaboration (see longitudinal plan of care) . Referred to pharmacy team for assistance with HLD medication management.  06-07-2020: The patient currently working with the pharm D. 08-02-2020: The patient continue to work with the pharm D.  . Evaluation of current treatment plan related to HLD and patient's adherence to plan as established by provider. 08-02-2020: The patient is compliant with the plan of care. The patient is doing well.  . Advised patient to send letter to the [facility] email address for review and forwarding to the pharmacist . Provided education to patient re: printing off last office visit note from 02-17-2021 with pcp as supporting documentation to the expressed needs during the call.  06-07-2020: The patient is still working on getting new insurance. She will let RNCM know if she needs additional assistance. Did ask for supply company that could assist with script she has for a walker with seat and breaks. Provided numbers to medical supply companies  in [facility] for the patient to call. 08-02-2020: The patient has new insurance.  The patients needs are being met.  . Reviewed medications with patient and discussed compliance. 08-02-2020:  The patient is compliant with her medications denies any needs at this time.  Nash Dimmer with pcp and pharmacist  regarding needs expressed for documentation needed for insurance purposes  . Discussed plans with patient for ongoing care management follow up and provided patient with direct contact information for care management team . Reviewed scheduled/upcoming provider appointments including: 08-23-2020 at 840 am . Pharmacy referral for assistance with expressed paperwork for insurance purposes. 08-02-2020 completed . Review of SDOH- the patient denies any needs. May be interested in food resources when she has her hernia repair surgery. Patient has information and will let RNCM know if changes are needed.     Patient Goals/Self-Care Activities: . Over the next 120 days, patient will:   - call for medicine refill 2 or 3 days before it runs out - call if I am sick  and can't take my medicine - keep a list of all the medicines I take; vitamins and herbals too - learn to read medicine labels - use a pillbox to sort medicine - use an alarm clock or phone to remind me to take my medicine - change to whole grain breads, cereal, pasta - drink 6 to 8 glasses of water each day - eat 3 to 5 servings of fruits and vegetables each day - fill half the plate with nonstarchy vegetables - limit fast food meals to no more than 1 per week - manage portion size - prepare main meal at home 3 to 5 days each week - read food labels for fat, fiber, carbohydrates and portion size - be open to making changes - I can manage, know and watch for signs of a heart attack - if I have chest pain, call for help - learn about small changes that will make a big difference - learn my personal risk factors  - barriers to medication adherence identified - medication list compiled - medication list reviewed - medication-adherence assessment completed - self-management plan initiated or updated - understanding of current medications assessed  Follow Up Plan: Telephone follow up appointment with care management team member scheduled for:10-11-2020 at [place] am       Plan: Telephone follow up appointment with care management team member scheduled for:  10-11-2020 at [place] am   [name] RN, MSN, [facility] Mobile: [phone]

## 2020-08-02 NOTE — Patient Instructions (Signed)
Visit Information  Patient Care Plan: General Social Work (Adult)    Problem Identified: I'm managing my physical and mental health better now   Priority: Medium    Goal: Depressive Symptoms Identified   Start Date: 03/21/2020  Priority: Medium  Note:   Evidence-based guidance:   Identify risk for depression by reviewing presenting symptoms and risk factors.   Review use of medications that contribute to depression such as steroid, narcotic, sedative, antihypertensive, beta blocker, cytoxic agent.   Review related metabolic processes, including infection, anemia, thyroid dysfunction, kidney failure, heart failure, alcohol or substance use.   Perform depression screening using standardized tools to obtain baseline intensity of depressive symptoms.   Perform or refer for a full diagnostic interview when positive screening results are noted; use DSM-5 criteria to determine appropriate diagnosis (e.g., major depression, persistent depressive disorder, unspecified depressive    disorder).   Notes:    Current Barriers:  . Limited social support . Level of care concerns . ADL IADL limitations . Social Isolation . Limited access to caregiver  Clinical Social Work Clinical Goal(s):  Marland Kitchen Over the next 120 days, patient will work with SW to address concerns related to gaining additional support/resource connection in order to maintain health  Interventions: . Inter-disciplinary care team collaboration (see longitudinal plan of care) . Patient interviewed and appropriate assessments performed . Provided patient with information about available personal care service resources within [facility] ([facility], Private Pay, [facility] and Day Programs). Patient reports that she is not eligible for Medicaid and therefore would be unable to get PCS. Patient states that she may have to pay out of pocket for personal care. . Discussed plans with patient for ongoing care management follow up and provided  patient with direct contact information for care management team . Advised patient to contact CCM LCSW if future social work needs arise. . Assisted patient/caregiver with obtaining information about health plan benefits . Provided education and assistance to client regarding Advanced Directives. . Patient reports ongoing cramping and tingling with her toes and fingers. She reports that her toes will sometimes turn purple. She is going to discuss this issue with PCP during office visit this month. . Patient reports that her daughter stayed with her for 3 months but relocated back to a different state. Patient reports doing well with this adjustment. Family is hopeful that daughter can relocate back to [place] soon in order to provide caregiver to patient. Patient shares that her neighbors are moving and she is interested in her daughter buying this residence.  . Patient reports that she has been receiving ongoing socialization through church. However, she reports that last Sunday she attended a social event after church and became too hot and had to leave. LCSW provided education on appropriate and safe health care which includes learning her limitations. Patient admits that she did not eat enough before she left on Sunday which could be why she felt so weak.  . Stress and depression related to current circumstances with ankle repair. Patient reports that she is having difficulty accepting her current health state. . Patient reports that she is using her car for transportation instead of her jeep because she is unable to get in and out of it due to its' height.  . Patient is currently in [place] visiting family but has her return flight back to [place] this afternoon. She shares that she had a minor fall/trip while she was visiting with family but it is healing well. She was very  excited that she was able to visit her grandchildren and go with them to meet [place].  Patient Self Care Activities:  . Calls provider  office for new concerns or questions . Lacks social connections  Please see past updates related to this goal by clicking on the "Past Updates" button in the selected goal    Task: Identify Depressive Symptoms and Facilitate Treatment   Note:   Care Management Activities:    - DSM-5 clinical interview performed    Notes:    Patient Care Plan: RNCM: Diabetes Type 2 (Adult)    Problem Identified: RNCM: Glycemic Management (Diabetes, Type 2)   Priority: Medium    Long-Range Goal: RNCM: Glycemic Management Optimized   Priority: Medium  Note:   Objective:  . Lab Results .  Component . Value . Date .   Marland Kitchen HGBA1C . 7.7 (H) . 05/23/2020 .    Marland Kitchen Lab Results .  Component . Value . Date .   Marland Kitchen CREATININE . 0.83 . 02/18/2020 .    Marland Kitchen No results found for: EGFR Current Barriers:  Marland Kitchen Knowledge Deficits related to basic Diabetes pathophysiology and self care/management . Knowledge Deficits related to medications used for management of diabetes . Film/video editor . Limited Social Support . Unable to independently manage DM . Lacks social connections . Does not maintain contact with provider office . Does not contact provider office for questions/concerns Case Manager Clinical Goal(s):  Marland Kitchen Collaboration with [name], DO regarding development and update of comprehensive plan of care as evidenced by provider attestation and co-signature . Inter-disciplinary care team collaboration (see longitudinal plan of care) . Over the next 120 days, patient will demonstrate improved adherence to prescribed treatment plan for diabetes self care/management as evidenced by:  . daily monitoring and recording of CBG  . adherence to ADA/ carb modified diet . adherence to prescribed medication regimen Interventions:  . Provided education to patient about basic DM disease process . Reviewed medications with patient and discussed importance of medication adherence. 08-02-2020: Reviewed her change in  [place] and the patient is tolerating well. Denies any issues at this time. . Discussed plans with patient for ongoing care management follow up and provided patient with direct contact information for care management team . Provided patient with written educational materials related to hypo and hyperglycemia and importance of correct treatment.  08-02-2020: The patient denies any episodes of low blood sugars. Average around 119 . Reviewed scheduled/upcoming provider appointments including: 08-23-2020 at [place] am . Advised patient, providing education and rationale, to check cbg BID and record, calling pcp for findings outside established parameters.  08-02-2020: States blood sugars are better since medication changes. The patient verbalized this am her blood sugar was 119.. . Referral made to pharmacy team for assistance with help with letter she received about "MIV" and needing documentation that she is not on insulin and other diagnosis information.  The patient to send e-mail with information to the [facility] will forward to CCM team pharmacist. 06-07-2020: The patient is still working on paperwork. Has what she needs from the office. 08-02-2020: The patient has found and insurance company that she likes. She has what she needs currently  . Collaboration with the pcp and CCM pharmacist concerning needing documentation that she needs a letter stating she does not have multiple heart conditions and she is not insulin dependent. Will send in basket message to the pcp and pharmacist for assistance. Did advise the patient that she could  copy her last office visit note off of my Chart that had her diagnosis and medications listed. 06-07-2020: The patient is working with pharmacist. 08-02-2020: Ongoing support from pharm D. No new needs at this time.  . Review of patient status, including review of consultants reports, relevant laboratory and other test results, and medications completed. . In basket message  to Dr. Wynetta Emery and clinical staff at [facility] to make a referral to the breast center in [place] for annual mammogram. 08-02-2020: The patient states that she had her mammogram and she is doing well. She says the radiologist feels that it is fatty tissue and recommended that she take Vitamin E. She has not started the Vitamin E yet.  Patient Goals/Self-Care Activities . Over the next 120 days, patient will:  - UNABLE to independently manage DM Attends all scheduled provider appointments Checks blood sugars as prescribed and utilize hyper and hypoglycemia protocol as needed Adheres to prescribed ADA/carb modified. 08-02-2020: The patient is working on dietary changes.  The RNCM sending planning healthy meals document via mail to the patient.  - barriers to adherence to treatment plan identified - blood glucose monitoring encouraged - blood glucose readings reviewed.  08-02-2020: Average readings 119 - individualized medical nutrition therapy provided - mutual A1C goal set or reviewed - resources required to improve adherence to care identified - self-awareness of signs/symptoms of hypo or hyperglycemia encouraged - use of blood glucose monitoring log promoted Follow Up Plan: Telephone follow up appointment with care management team member scheduled for: 10-11-2020 at 0945 am   Task: RNCM: Alleviate Barriers to Glycemic Management   Note:   Care Management Activities:    - barriers to adherence to treatment plan identified - blood glucose monitoring encouraged - blood glucose readings reviewed - individualized medical nutrition therapy provided - mutual A1C goal set or reviewed - resources required to improve adherence to care identified - self-awareness of signs/symptoms of hypo or hyperglycemia encouraged - use of blood glucose monitoring log promoted       Patient Care Plan: RNCM: Hypertension (Adult)    Problem Identified: RNCM: Hypertension (Hypertension)   Priority: Medium     Long-Range Goal: RNCM: Hypertension Monitored   Priority: Medium  Note:   Objective:  . Last practice recorded BP readings:  . BP Readings from Last 3 Encounters: .  07/15/20 . 100/67 .  05/23/20 . 114/81 .  02/18/20 . 114/83 .     Marland Kitchen Most recent eGFR/CrCl: No results found for: EGFR  No components found for: CRCL Current Barriers:  Marland Kitchen Knowledge Deficits related to basic understanding of hypertension pathophysiology and self care management . Knowledge Deficits related to understanding of medications prescribed for management of hypertension . Limited Social Support . Film/video editor.  . Unable to independently manage HTN . Lacks social connections . Does not maintain contact with provider office . Does not contact provider office for questions/concerns Case Manager Clinical Goal(s):  Marland Kitchen Over the next 120 days, patient will verbalize understanding of plan for hypertension management . Over the next 120 days, patient will attend all scheduled medical appointments: 05-23-2020 at 2:20 pm . Over the next 120 days, patient will demonstrate improved adherence to prescribed treatment plan for hypertension as evidenced by taking all medications as prescribed, monitoring and recording blood pressure as directed, adhering to low sodium/DASH diet . Over the next 120 days, patient will demonstrate improved health management independence as evidenced by checking blood pressure as directed and notifying PCP if SBP>160 or DBP >  90, taking all medications as prescribe, and adhering to a low sodium diet as discussed. . Over the next 120 days, patient will verbalize basic understanding of hypertension disease process and self health management plan as evidenced by compliance with the plan of care, taking medications as ordered, and working with the CCM team to manage health and well being Interventions:  . Collaboration with [name], DO regarding development and update of comprehensive plan of  care as evidenced by provider attestation and co-signature . Inter-disciplinary care team collaboration (see longitudinal plan of care) . UNABLE to independently:manage HTN . Evaluation of current treatment plan related to hypertension self management and patient's adherence to plan as established by provider. 08-02-2020: The patient states her blood pressure is doing good. Denies any lows. The patient states that she takes it sometimes at home.  . Provided education to patient re: stroke prevention, s/s of heart attack and stroke, DASH diet, complications of uncontrolled blood pressure. 06-07-2020: Review of the benefits of a heart healthy/ADA diet. Information sent to the patient via mail. 08-02-2020: The patient received information is looking forward to the fresh fruits and vegetables from the garden. Does not have an appetite and this has been over the last month. She has lost weight but has not weighed lately. Denies issues with dehydration.  . Reviewed medications with patient and discussed importance of compliance. 08-02-2020: The patient is compliant with medications. Is coming by the office today to pick up a pill box to use at home. Will also provide information on Auvi-Q.  The patient stated that she could not afford 165.00 for an EPI pen. [employer] is a program that allows patients to get EPI pen for 25.00.  Marland Kitchen Discussed plans with patient for ongoing care management follow up and provided patient with direct contact information for care management team . Advised patient, providing education and rationale, to monitor blood pressure daily and record, calling PCP for findings outside established parameters.  . Reviewed scheduled/upcoming provider appointments including: 08-23-2020 at [place] am Patient Goals/Self-Care Activities . Over the next 120 days, patient will:  - UNABLE to independently manage HTN Calls provider office for new concerns, questions, or BP outside discussed parameters Checks BP and  records as discussed Follows a low sodium diet/DASH diet - blood pressure trends reviewed - depression screen reviewed - home or ambulatory blood pressure monitoring encouraged Follow Up Plan: Telephone follow up appointment with care management team member scheduled for: 10-11-2020 at 0945 am   Task: RNCM: Identify and Monitor Blood Pressure Elevation   Note:   Care Management Activities:    - blood pressure trends reviewed - depression screen reviewed - home or ambulatory blood pressure monitoring encouraged       Patient Care Plan: RNCM: Management of HLD    Problem Identified: RNCM: Management of HLD   Priority: Medium    Goal: RNCM: Managment of HLD   Priority: Medium  Note:   Current Barriers:  . Poorly controlled hyperlipidemia, complicated by HTN, DM . Current antihyperlipidemic regimen: Lipitor 20 mg QD . Most recent lipid panel:  . Lab Results .  Component . Value . Date .   Marland Kitchen CHOL . 207 (H) . 02/18/2020 .   Marland Kitchen HDL . 122 . 02/18/2020 .   Marland Kitchen [place] . 69 . 02/18/2020 .   Marland Kitchen TRIG . 97 . 02/18/2020 .   Marland Kitchen CHOLHDL . 1.4 . 01/03/2017 .    Marland Kitchen ASCVD risk enhancing conditions: age 53 DM, HTN .  Unable to independently manage HLD . Lacks social connections . Does not maintain contact with provider office . Does not contact provider office for questions/concerns  RN Care Manager Clinical Goal(s):  Marland Kitchen Over the next 120 days, patient will work with Consulting civil engineer, providers, and care team towards execution of optimized self-health management plan . Over the next 120 days, patient will verbalize understanding of plan for working with the CCM team to meet health and wellness needs . Over the next 120 days, patient will work with [facility], pharmacist and pcp to address needs related to expressed needs for paperwork with documentation about chronic conditions and medicaitons  . Over the next 120 days, patient will attend all scheduled medical appointments: 08-23-2020 at 0840 am . Over  the next 30 days, patient will work with CM team pharmacist to get needed documentation expressed in the call today.   Interventions: . Collaboration with [name], DO regarding development and update of comprehensive plan of care as evidenced by provider attestation and co-signature . Inter-disciplinary care team collaboration (see longitudinal plan of care) . Medication review performed; medication list updated in electronic medical record.  Bertram Savin care team collaboration (see longitudinal plan of care) . Referred to pharmacy team for assistance with HLD medication management. 06-07-2020: The patient currently working with the pharm D. 08-02-2020: The patient continue to work with the pharm D.  . Evaluation of current treatment plan related to HLD and patient's adherence to plan as established by provider. 08-02-2020: The patient is compliant with the plan of care. The patient is doing well.  . Advised patient to send letter to the [facility] email address for review and forwarding to the pharmacist . Provided education to patient re: printing off last office visit note from 02-17-2021 with pcp as supporting documentation to the expressed needs during the call.  06-07-2020: The patient is still working on getting new insurance. She will let RNCM know if she needs additional assistance. Did ask for supply company that could assist with script she has for a walker with seat and breaks. Provided numbers to medical supply companies in [place] for the patient to call. 08-02-2020: The patient has new insurance.  The patients needs are being met.  . Reviewed medications with patient and discussed compliance. 08-02-2020:  The patient is compliant with her medications denies any needs at this time.  Nash Dimmer with pcp and pharmacist  regarding needs expressed for documentation needed for insurance purposes  . Discussed plans with patient for ongoing care management follow up and provided  patient with direct contact information for care management team . Reviewed scheduled/upcoming provider appointments including: 08-23-2020 at 840 am . Pharmacy referral for assistance with expressed paperwork for insurance purposes. 08-02-2020 completed . Review of SDOH- the patient denies any needs. May be interested in food resources when she has her hernia repair surgery. Patient has information and will let RNCM know if changes are needed.     Patient Goals/Self-Care Activities: . Over the next 120 days, patient will:   - call for medicine refill 2 or 3 days before it runs out - call if I am sick and can't take my medicine - keep a list of all the medicines I take; vitamins and herbals too - learn to read medicine labels - use a pillbox to sort medicine - use an alarm clock or phone to remind me to take my medicine - change to whole grain breads, cereal, pasta - drink 6  to 8 glasses of water each day - eat 3 to 5 servings of fruits and vegetables each day - fill half the plate with nonstarchy vegetables - limit fast food meals to no more than 1 per week - manage portion size - prepare main meal at home 3 to 5 days each week - read food labels for fat, fiber, carbohydrates and portion size - be open to making changes - I can manage, know and watch for signs of a heart attack - if I have chest pain, call for help - learn about small changes that will make a big difference - learn my personal risk factors  - barriers to medication adherence identified - medication list compiled - medication list reviewed - medication-adherence assessment completed - self-management plan initiated or updated - understanding of current medications assessed  Follow Up Plan: Telephone follow up appointment with care management team member scheduled for:10-11-2020 at 0945 am     Task: RNCM: management of HLD   Note:   Care Management Activities:    - barriers to medication adherence identified -  medication list compiled - medication list reviewed - medication-adherence assessment completed - self-management plan initiated or updated - understanding of current medications assessed       Patient Care Plan: General Social Work (Adult)    Problem Identified: Quality of Life (General Plan of Care)     Long-Range Goal: Quality of Life Maintained   Start Date: 06/20/2020  Priority: Medium  Note:   Timeframe:  Long-Range Goal Priority:  Medium Start Date:   06/20/20                        Expected End Date:  09/17/20                     Follow Up Date- 08/11/20   - arrange a ride through an agency 1 week before appointment - ask family or friend for a ride - call to cancel if needed - keep a calendar with prescription refill dates - keep a calendar with appointment dates - learn the bus route - use public transportation    Why is this important?    Part of staying healthy is seeing the doctor for follow-up care.   If you forget your appointments, there are some things you can do to stay on track.    CARE PLAN ENTRY (see longitudinal plan of care for additional care plan information)  Current Barriers:  . Patient In need of assistance with connection to community resources in order to attend needed appointments  . Knowledge deficits and need for support, education and care coordination related to community resources support  . ADL IADL limitations and Social Isolation  Clinical Goal(s)  . Over the next 120 days patient will be contact [facility] as advised by LCSW . Over the next 120 days, patient will work with care management team member to address concerns related to gaining mammogram appointment  . Over the next 120 days, patient will work with LCSW to address needs related to lack of self care . Over the next 120 days, patient will implement appropriate coping skills to improve stress management   Interventions provided by  LCSW:  . Assessed patient's care coordination needs related to needing a mammogram appointment and discussed ongoing care management follow up  . Patient reports that PCP put in order for a mammogram but when she  used the link on MyChart and put in her zip code it was asking her to chose locations in [place] or [place] instead of [place]. Patient reports that this happened last year. Patient asked CCM LCSW to notify PCP regarding this concern. CCM LCSW informed patient that PCP put in the order her preferred location as [facility]. CCM LCSW provided patient with information about [facility]. Patient is agreeable to contact them today.  . Patient reports that she wishes for CCM LCSW to update PCP on her lyrica medication as well. She reports that this medication is making her extremely tired and she is wondering if she should discontinue medication and get back on a lower dose of gabapentin.  . Patient is actively involved with [facility].  Marland Kitchen Advised patient to implement appropriate self-care and stress management coping skills into her daily routine* . Collaborated with appropriate clinical care team members regarding patient needs . Patient interviewed and appropriate assessments performed . Discussed plans with patient for ongoing care management follow up and provided patient with direct contact information for care management team . Assisted patient/caregiver with obtaining information about health plan benefits . Provided education and assistance to client regarding Advanced Directives. . Mindfulness or Relaxation Training, Psychoeducation and/or Health Education, and Problem Solving  Patient Self Care Activities & Deficits:  . Patient is unable to independently navigate community resource options without care coordination support  . Acknowledges deficits and is motivated to resolve concern  . Patient is able to contact [facility]  as  discussed today . Lacks social connections . Calls provider office for new concerns or questions, Ability for insight, Independent living, and Motivation for treatment  Initial goal documentation   Task: Support and Maintain Acceptable Degree of Health, Comfort and Happiness   Note:   Care Management Activities:    - affirmation provided - community involvement promoted - expression of thoughts about present/future encouraged - independence in all possible areas promoted - life review by storytelling encouraged - patient strengths promoted - psychosocial concerns monitored - self-expression encouraged - sleep diary encouraged - sleep hygiene techniques encouraged - social relationships promoted - strategies to maintain hearing and/or vision promoted - strategies to maintain intimacy promoted - wellness behaviors promoted    Notes:      Patient verbalizes understanding of instructions provided today and agrees to view in [place].   Telephone follow up appointment with care management team member scheduled for: 10-11-2020 at [place] am   [name] RN, MSN, [facility] Mobile: [phone]

## 2020-08-03 ENCOUNTER — Telehealth: Payer: Self-pay | Admitting: Family Medicine

## 2020-08-03 MED ORDER — EPINEPHRINE 0.3 MG/0.3ML IJ SOAJ: 0.3000 mg | INTRAMUSCULAR | 12 refills | Status: DC | PRN

## 2020-08-03 NOTE — Telephone Encounter (Signed)
-----   Message from Pablo Ledger, CMA sent at 08/03/2020  8:54 AM EDT ----- Regarding: FW: Epi pen script to [facility] [address] in [place] Can we send in an epi pen script please? ----- Message ----- From: Marlowe Sax Sent: 08/02/2020   1:03 PM EDT To: Dorcas Carrow, DO, [facility] Clinical Subject: Epi pen script to [facility] [address] in [place] [address] Laural Benes, When talking to Ms. Keough today she told me she could not afford her EPI pen it cost 165.00.  I found a program [employer] where she can get her EPI pen for free or a minimal cost of 25.00.  She has the paperwork to fill out on her end but could you please send the RX for the Epi pen to [facility] [address] in [place]?  She was so happy that she would be able to afford it through the [employer] program. Thanks, Elita Quick

## 2020-08-07 ENCOUNTER — Other Ambulatory Visit: Payer: Self-pay | Admitting: Family Medicine

## 2020-08-07 DIAGNOSIS — E785 Hyperlipidemia, unspecified: Secondary | ICD-10-CM

## 2020-08-07 NOTE — Telephone Encounter (Signed)
Requested Prescriptions  Pending Prescriptions Disp Refills  . atorvastatin (LIPITOR) 20 MG tablet [Pharmacy Med Name: ATORVASTATIN 20MG  TABLETS] 90 tablet 1    Sig: TAKE 1 TABLET(20 MG) BY MOUTH DAILY     Cardiovascular:  Antilipid - Statins Failed - 08/07/2020 11:38 AM      Failed - Total Cholesterol in normal range and within 360 days    Cholesterol, Total  Date Value Ref Range Status  02/18/2020 207 (H) 100 - 199 mg/dL Final         Failed - LDL in normal range and within 360 days    LDL Chol Calc (NIH)  Date Value Ref Range Status  02/18/2020 69 0 - 99 mg/dL Final         Passed - HDL in normal range and within 360 days    HDL  Date Value Ref Range Status  02/18/2020 122 >39 mg/dL Final         Passed - Triglycerides in normal range and within 360 days    Triglycerides  Date Value Ref Range Status  02/18/2020 97 0 - 149 mg/dL Final         Passed - Patient is not pregnant      Passed - Valid encounter within last 12 months    Recent Outpatient Visits          3 weeks ago Breast pain   [facility] [place], Megan P, DO   2 months ago Type 2 diabetes mellitus with diabetic neuropathy, without long-term current use of insulin (HCC)   [facility] [place], Megan P, DO   5 months ago Routine general medical examination at a health care facility   [facility] P, DO   10 months ago Type 2 diabetes mellitus with diabetic neuropathy, without long-term current use of insulin (HCC)   [facility] [place], Megan P, DO   1 year ago Closed bimalleolar fracture of left ankle with routine healing, subsequent encounter   [facility] [place], Megan P, DO      Future Appointments            In 2 weeks Johnson, Megan P, DO [facility], [facility]           . montelukast (SINGULAIR) 10 MG tablet [Pharmacy Med Name: MONTELUKAST 10MG  TABLETS] 90 tablet 1    Sig: TAKE 1 TABLET(10 MG) BY MOUTH AT  BEDTIME     Pulmonology:  Leukotriene Inhibitors Passed - 08/07/2020 11:38 AM      Passed - Valid encounter within last 12 months    Recent Outpatient Visits          3 weeks ago Breast pain   [facility] [place], Megan P, DO   2 months ago Type 2 diabetes mellitus with diabetic neuropathy, without long-term current use of insulin (HCC)   [facility] [place], Megan P, DO   5 months ago Routine general medical examination at a health care facility   [facility], [place] P, DO   10 months ago Type 2 diabetes mellitus with diabetic neuropathy, without long-term current use of insulin (HCC)   [facility] [place], Megan P, DO   1 year ago Closed bimalleolar fracture of left ankle with routine healing, subsequent encounter   [facility] [place], Megan P, DO      Future Appointments            In 2 weeks  Johnson, Megan P, DO [facility], [facility]

## 2020-08-11 ENCOUNTER — Telehealth: Payer: Self-pay

## 2020-08-16 DIAGNOSIS — G63 Polyneuropathy in diseases classified elsewhere: Secondary | ICD-10-CM | POA: Diagnosis not present

## 2020-08-16 DIAGNOSIS — G20 Parkinson's disease: Secondary | ICD-10-CM | POA: Diagnosis not present

## 2020-08-16 DIAGNOSIS — G2581 Restless legs syndrome: Secondary | ICD-10-CM | POA: Diagnosis not present

## 2020-08-23 ENCOUNTER — Ambulatory Visit: Payer: BLUE CROSS/BLUE SHIELD | Admitting: Family Medicine

## 2020-08-24 ENCOUNTER — Telehealth: Payer: Self-pay

## 2020-08-29 ENCOUNTER — Other Ambulatory Visit: Payer: Self-pay | Admitting: Family Medicine

## 2020-08-29 DIAGNOSIS — I10 Essential (primary) hypertension: Secondary | ICD-10-CM

## 2020-08-29 DIAGNOSIS — E1140 Type 2 diabetes mellitus with diabetic neuropathy, unspecified: Secondary | ICD-10-CM

## 2020-08-29 NOTE — Telephone Encounter (Signed)
Scheduled 5/12 

## 2020-08-29 NOTE — Telephone Encounter (Signed)
Notes to clinic: Patient has appt on 09/01/2020 Review for refills   Requested Prescriptions  Pending Prescriptions Disp Refills   amLODipine (NORVASC) 5 MG tablet [Pharmacy Med Name: AMLODIPINE BESYLATE 5MG TABLETS] 90 tablet 1    Sig: TAKE 1 TABLET(5 MG) BY MOUTH DAILY      Cardiovascular:  Calcium Channel Blockers Passed - 08/29/2020  7:11 AM      Passed - Last BP in normal range    BP Readings from Last 1 Encounters:  07/15/20 100/67          Passed - Valid encounter within last 6 months    Recent Outpatient Visits           1 month ago Breast pain   [place], Megan P, DO   3 months ago Type 2 diabetes mellitus with diabetic neuropathy, without long-term current use of insulin ([place])   [place], Megan P, DO   6 months ago Routine general medical examination at a health care facility   [facility] [place] [place] P, DO   11 months ago Type 2 diabetes mellitus with diabetic neuropathy, without long-term current use of insulin ([place])   [place], Megan P, DO   1 year ago Closed bimalleolar fracture of left ankle with routine healing, subsequent encounter   [place], Megan P, DO       Future Appointments             In 3 days Johnson, Megan P, DO [place], [facility]               hydrochlorothiazide (HYDRODIURIL) 25 MG tablet [Pharmacy Med Name: HYDROCHLOROTHIAZIDE 25MG TABLETS] 90 tablet 1    Sig: TAKE 1 TABLET(25 MG) BY MOUTH DAILY      Cardiovascular: Diuretics - Thiazide Failed - 08/29/2020  7:11 AM      Failed - Ca in normal range and within 360 days    Calcium  Date Value Ref Range Status  02/18/2020 10.5 (H) 8.7 - 10.3 mg/dL Final          Failed - K in normal range and within 360 days    Potassium  Date Value Ref Range Status  02/18/2020 3.4 (L) 3.5 - 5.2 mmol/L Final          Passed - Cr in normal range and within 360 days     Creatinine, Ser  Date Value Ref Range Status  02/18/2020 0.83 0.57 - 1.00 mg/dL Final          Passed - Na in normal range and within 360 days    Sodium  Date Value Ref Range Status  02/18/2020 140 134 - 144 mmol/L Final          Passed - Last BP in normal range    BP Readings from Last 1 Encounters:  07/15/20 100/67          Passed - Valid encounter within last 6 months    Recent Outpatient Visits           1 month ago Breast pain   [facility] [place], Megan P, DO   3 months ago Type 2 diabetes mellitus with diabetic neuropathy, without long-term current use of insulin ([place])   [facility] [place], Megan P, DO   6 months ago Routine general medical examination at a health care facility   [facility], [place], DO  11 months ago Type 2 diabetes mellitus with diabetic neuropathy, without long-term current use of insulin ([place])   [place], Megan P, DO   1 year ago Closed bimalleolar fracture of left ankle with routine healing, subsequent encounter   [place], Barb Merino, DO       Future Appointments             In 3 days Wynetta Emery, Barb Merino, DO [place], [facility]               metFORMIN (GLUCOPHAGE) 500 MG tablet [employer] Med Name: METFORMIN 500MG TABLETS] 180 tablet 1    Sig: TAKE 1 TABLET(500 MG) BY MOUTH TWICE DAILY WITH A MEAL      Endocrinology:  Diabetes - Biguanides Passed - 08/29/2020  7:11 AM      Passed - Cr in normal range and within 360 days    Creatinine, Ser  Date Value Ref Range Status  02/18/2020 0.83 0.57 - 1.00 mg/dL Final          Passed - HBA1C is between 0 and 7.9 and within 180 days    HB A1C (BAYER DCA - WAIVED)  Date Value Ref Range Status  05/23/2020 7.7 (H) <7.0 % Final    Comment:                                          Diabetic Adult            <7.0                                       Healthy Adult        4.3 - 5.7                                                            (DCCT/NGSP) American Diabetes Association's Summary of Glycemic Recommendations for Adults with Diabetes: Hemoglobin A1c <7.0%. More stringent glycemic goals (A1c <6.0%) may further reduce complications at the cost of increased risk of hypoglycemia.           Passed - AA eGFR in normal range and within 360 days    GFR calc Af Amer  Date Value Ref Range Status  02/18/2020 89 >59 mL/min/1.73 Final    Comment:    **In accordance with recommendations from the NKF-ASN Task force,**   [facility] is in the process of updating its eGFR calculation to the   2021 CKD-EPI creatinine equation that estimates kidney function   without a race variable.    GFR calc non Af Amer  Date Value Ref Range Status  02/18/2020 77 >59 mL/min/1.73 Final          Passed - Valid encounter within last 6 months    Recent Outpatient Visits           1 month ago Breast pain   [place], Megan P, DO   3 months ago Type 2 diabetes mellitus with diabetic neuropathy, without long-term current use of insulin ([place])   [place], [place], DO  6 months ago Routine general medical examination at a health care facility   [facility], [place], DO   11 months ago Type 2 diabetes mellitus with diabetic neuropathy, without long-term current use of insulin [facility])   [place], Megan P, DO   1 year ago Closed bimalleolar fracture of left ankle with routine healing, subsequent encounter   [facility] [name], DO       Future Appointments             In 3 days Wynetta Emery, Barb Merino, DO [facility] [place], [facility]

## 2020-09-01 ENCOUNTER — Encounter: Payer: Self-pay | Admitting: Family Medicine

## 2020-09-01 ENCOUNTER — Other Ambulatory Visit: Payer: Self-pay

## 2020-09-01 ENCOUNTER — Ambulatory Visit: Payer: BLUE CROSS/BLUE SHIELD | Admitting: Family Medicine

## 2020-09-01 VITALS — BP 118/81 | HR 97 | Wt 183.8 lb

## 2020-09-01 DIAGNOSIS — E1140 Type 2 diabetes mellitus with diabetic neuropathy, unspecified: Secondary | ICD-10-CM | POA: Diagnosis not present

## 2020-09-01 DIAGNOSIS — I10 Essential (primary) hypertension: Secondary | ICD-10-CM | POA: Diagnosis not present

## 2020-09-01 DIAGNOSIS — M792 Neuralgia and neuritis, unspecified: Secondary | ICD-10-CM

## 2020-09-01 DIAGNOSIS — E559 Vitamin D deficiency, unspecified: Secondary | ICD-10-CM | POA: Diagnosis not present

## 2020-09-01 DIAGNOSIS — E785 Hyperlipidemia, unspecified: Secondary | ICD-10-CM

## 2020-09-01 DIAGNOSIS — I700 Atherosclerosis of aorta: Secondary | ICD-10-CM | POA: Diagnosis not present

## 2020-09-01 LAB — BAYER DCA HB A1C WAIVED: HB A1C (BAYER DCA - WAIVED): 8.1 % — ABNORMAL HIGH (ref <7.0)

## 2020-09-01 MED ORDER — SUCRALFATE 1 G PO TABS: 1.0000 g | ORAL_TABLET | Freq: Three times a day (TID) | ORAL | 2 refills | Status: DC

## 2020-09-01 MED ORDER — DULOXETINE HCL 60 MG PO CPEP: 60.0000 mg | ORAL_CAPSULE | Freq: Every day | ORAL | 1 refills | Status: DC

## 2020-09-01 MED ORDER — AMLODIPINE BESYLATE 5 MG PO TABS: ORAL_TABLET | ORAL | 1 refills | Status: DC

## 2020-09-01 MED ORDER — EMPAGLIFLOZIN 25 MG PO TABS: 25.0000 mg | ORAL_TABLET | Freq: Every day | ORAL | 1 refills | Status: DC

## 2020-09-01 MED ORDER — GABAPENTIN 100 MG PO CAPS: 100.0000 mg | ORAL_CAPSULE | Freq: Three times a day (TID) | ORAL | 1 refills | Status: DC

## 2020-09-01 MED ORDER — HYDROCHLOROTHIAZIDE 25 MG PO TABS: 25.0000 mg | ORAL_TABLET | Freq: Every day | ORAL | 1 refills | Status: DC

## 2020-09-01 MED ORDER — METFORMIN HCL 500 MG PO TABS: 500.0000 mg | ORAL_TABLET | Freq: Two times a day (BID) | ORAL | 1 refills | Status: DC

## 2020-09-01 MED ORDER — TRULICITY 0.75 MG/0.5ML ~~LOC~~ SOAJ: 0.7500 mg | SUBCUTANEOUS | 2 refills | Status: DC

## 2020-09-01 NOTE — Assessment & Plan Note (Signed)
Not under good control with A1c of 8.0. Will add trulicity 0.75 weekly and recheck 3 months. Call with any concerns.

## 2020-09-01 NOTE — Assessment & Plan Note (Signed)
Rechecking labs today. Await results. Treat as needed.  °

## 2020-09-01 NOTE — Addendum Note (Signed)
Addended by: Pablo Ledger on: 09/01/2020 02:40 PM   Modules accepted: Orders

## 2020-09-01 NOTE — Assessment & Plan Note (Signed)
Under good control on current regimen. Continue current regimen. Continue to monitor. Call with any concerns. Refills given. Labs drawn today.   

## 2020-09-01 NOTE — Progress Notes (Signed)
BP 118/81   Pulse 97   Wt 183 lb 12.8 oz (83.4 kg)   SpO2 97%   BMI 32.56 kg/m    Subjective:    Patient ID: Lisa Galvan, female    DOB: 11/12/1960, 60 y.o.   MRN: [id number]  HPI: Lisa Galvan is a 60 y.o. female  Chief Complaint  Patient presents with  . Diabetes   DIABETES Hypoglycemic episodes:yes Polydipsia/polyuria: no Visual disturbance: no Chest pain: no Paresthesias: no Glucose Monitoring: no  Accucheck frequency: Not Checking Taking Insulin?: no Blood Pressure Monitoring: not checking Retinal Examination: Not up to Date Foot Exam: Up to Date Diabetic Education: Completed Pneumovax: Up to Date Influenza: Up to Date Aspirin: yes  HYPERTENSION / HYPERLIPIDEMIA Satisfied with current treatment? yes Duration of hypertension: chronic BP monitoring frequency: not checking BP medication side effects: no Past BP meds: amlodipine, hctz Duration of hyperlipidemia: chronic Cholesterol medication side effects: no Cholesterol supplements: none Past cholesterol medications: atorvastatin Medication compliance: excellent compliance Aspirin: yes Recent stressors: no Recurrent headaches: no Visual changes: no Palpitations: no Dyspnea: no Chest pain: no Lower extremity edema: no Dizzy/lightheaded: yes   Relevant past medical, surgical, family and social history reviewed and updated as indicated. Interim medical history since our last visit reviewed. Allergies and medications reviewed and updated.  Review of Systems  Constitutional: Negative.   Respiratory: Negative.   Cardiovascular: Negative.   Gastrointestinal: Negative.   Musculoskeletal: Negative.   Psychiatric/Behavioral: Negative.     Per HPI unless specifically indicated above     Objective:    BP 118/81   Pulse 97   Wt 183 lb 12.8 oz (83.4 kg)   SpO2 97%   BMI 32.56 kg/m   Wt Readings from Last 3 Encounters:  09/01/20 183 lb 12.8 oz (83.4 kg)  07/15/20 187 lb (84.8 kg)  05/23/20 190  lb (86.2 kg)    Physical Exam Vitals and nursing note reviewed.  Constitutional:      General: She is not in acute distress.    Appearance: Normal appearance. She is not ill-appearing, toxic-appearing or diaphoretic.  HENT:     Head: Normocephalic and atraumatic.     Right Ear: External ear normal.     Left Ear: External ear normal.     Nose: Nose normal.     Mouth/Throat:     Mouth: Mucous membranes are moist.     Pharynx: Oropharynx is clear.  Eyes:     General: No scleral icterus.       Right eye: No discharge.        Left eye: No discharge.     Extraocular Movements: Extraocular movements intact.     Conjunctiva/sclera: Conjunctivae normal.     Pupils: Pupils are equal, round, and reactive to light.  Cardiovascular:     Rate and Rhythm: Normal rate and regular rhythm.     Pulses: Normal pulses.     Heart sounds: Normal heart sounds. No murmur heard. No friction rub. No gallop.   Pulmonary:     Effort: Pulmonary effort is normal. No respiratory distress.     Breath sounds: Normal breath sounds. No stridor. No wheezing, rhonchi or rales.  Chest:     Chest wall: No tenderness.  Musculoskeletal:        General: Normal range of motion.     Cervical back: Normal range of motion and neck supple.  Skin:    General: Skin is warm and dry.     Capillary Refill: Capillary refill  takes less than 2 seconds.     Coloration: Skin is not jaundiced or pale.     Findings: No bruising, erythema, lesion or rash.  Neurological:     General: No focal deficit present.     Mental Status: She is alert and oriented to person, place, and time. Mental status is at baseline.  Psychiatric:        Mood and Affect: Mood normal.        Behavior: Behavior normal.        Thought Content: Thought content normal.        Judgment: Judgment normal.     Results for orders placed or performed in visit on 05/23/20  Bayer DCA Hb A1c Waived  Result Value Ref Range   HB A1C (BAYER DCA - WAIVED) 7.7 (H)  <7.0 %      Assessment & Plan:   Problem List Items Addressed This Visit      Cardiovascular and Mediastinum   Essential hypertension - Primary    Under good control on current regimen. Continue current regimen. Continue to monitor. Call with any concerns. Refills given. Labs drawn today.        Relevant Medications   hydrochlorothiazide (HYDRODIURIL) 25 MG tablet   amLODipine (NORVASC) 5 MG tablet   Other Relevant Orders   Comprehensive metabolic panel   Aortic atherosclerosis (HCC)    Will keep BP, cholesterol and sugars under good control. Continue to monitor. Call with any concerns.       Relevant Medications   hydrochlorothiazide (HYDRODIURIL) 25 MG tablet   amLODipine (NORVASC) 5 MG tablet   Other Relevant Orders   Comprehensive metabolic panel     Endocrine   Type 2 diabetes mellitus with diabetic neuropathy, unspecified (HCC)    Not under good control with A1c of 8.0. Will add trulicity 0.75 weekly and recheck 3 months. Call with any concerns.       Relevant Medications   metFORMIN (GLUCOPHAGE) 500 MG tablet   empagliflozin (JARDIANCE) 25 MG TABS tablet   Dulaglutide (TRULICITY) 0.75 MG/0.5ML SOPN   Other Relevant Orders   Bayer DCA Hb A1c Waived   Comprehensive metabolic panel     Other   Neurogenic pain (Chronic)   Relevant Medications   DULoxetine (CYMBALTA) 60 MG capsule   Hyperlipidemia    Under good control on current regimen. Continue current regimen. Continue to monitor. Call with any concerns. Refills given. Labs drawn today.       Relevant Medications   hydrochlorothiazide (HYDRODIURIL) 25 MG tablet   amLODipine (NORVASC) 5 MG tablet   Other Relevant Orders   Comprehensive metabolic panel   Lipid Panel w/o Chol/HDL Ratio   Vitamin D deficiency    Rechecking labs today. Await results. Treat as needed.       Relevant Orders   Comprehensive metabolic panel   VITAMIN D 25 Hydroxy (Vit-D Deficiency, Fractures)       Follow up  plan: Return in about 3 months (around 12/02/2020).

## 2020-09-01 NOTE — Assessment & Plan Note (Signed)
Will keep BP, cholesterol and sugars under good control. Continue to monitor. Call with any concerns.  

## 2020-09-02 LAB — COMPREHENSIVE METABOLIC PANEL
ALT: 57 IU/L — ABNORMAL HIGH (ref 0–32)
AST: 70 IU/L — ABNORMAL HIGH (ref 0–40)
Albumin/Globulin Ratio: 1.9 (ref 1.2–2.2)
Albumin: 4.2 g/dL (ref 3.8–4.8)
Alkaline Phosphatase: 120 IU/L (ref 44–121)
BUN/Creatinine Ratio: 6 — ABNORMAL LOW (ref 12–28)
BUN: 5 mg/dL — ABNORMAL LOW (ref 8–27)
Bilirubin Total: 1 mg/dL (ref 0.0–1.2)
CO2: 24 mmol/L (ref 20–29)
Calcium: 10.1 mg/dL (ref 8.7–10.3)
Chloride: 92 mmol/L — ABNORMAL LOW (ref 96–106)
Creatinine, Ser: 0.86 mg/dL (ref 0.57–1.00)
Globulin, Total: 2.2 g/dL (ref 1.5–4.5)
Glucose: 157 mg/dL — ABNORMAL HIGH (ref 65–99)
Potassium: 3.4 mmol/L — ABNORMAL LOW (ref 3.5–5.2)
Sodium: 140 mmol/L (ref 134–144)
Total Protein: 6.4 g/dL (ref 6.0–8.5)
eGFR: 77 mL/min/{1.73_m2} (ref >59)

## 2020-09-02 LAB — LIPID PANEL W/O CHOL/HDL RATIO
Cholesterol, Total: 153 mg/dL (ref 100–199)
HDL: 95 mg/dL (ref >39)
LDL Chol Calc (NIH): 42 mg/dL (ref 0–99)
Triglycerides: 86 mg/dL (ref 0–149)
VLDL Cholesterol Cal: 16 mg/dL (ref 5–40)

## 2020-09-02 LAB — VITAMIN D 25 HYDROXY (VIT D DEFICIENCY, FRACTURES): Vit D, 25-Hydroxy: 74 ng/mL (ref 30.0–100.0)

## 2020-09-13 ENCOUNTER — Encounter: Payer: Self-pay | Admitting: Nurse Practitioner

## 2020-09-13 ENCOUNTER — Ambulatory Visit: Payer: BLUE CROSS/BLUE SHIELD | Admitting: Nurse Practitioner

## 2020-09-13 ENCOUNTER — Other Ambulatory Visit: Payer: Self-pay

## 2020-09-13 VITALS — BP 113/83 | HR 96 | Temp 98.2°F | Wt 183.0 lb

## 2020-09-13 DIAGNOSIS — B370 Candidal stomatitis: Secondary | ICD-10-CM | POA: Diagnosis not present

## 2020-09-13 DIAGNOSIS — R220 Localized swelling, mass and lump, head: Secondary | ICD-10-CM | POA: Diagnosis not present

## 2020-09-13 DIAGNOSIS — E1140 Type 2 diabetes mellitus with diabetic neuropathy, unspecified: Secondary | ICD-10-CM

## 2020-09-13 MED ORDER — METFORMIN HCL 500 MG PO TABS: 1000.0000 mg | ORAL_TABLET | Freq: Two times a day (BID) | ORAL | 1 refills | Status: DC

## 2020-09-13 MED ORDER — NYSTATIN 100000 UNIT/ML MT SUSP: 5.0000 mL | Freq: Four times a day (QID) | OROMUCOSAL | 0 refills | Status: DC

## 2020-09-13 NOTE — Assessment & Plan Note (Addendum)
Was started on trulicity 2 weeks ago, now with lip and mouth swelling. Allergic reaction vs thrush. Denies shortness of breath and lung sounds clear on exam. Stop trulicity. Will increase metformin to 1,000mg  BID. Discussed diet and exercise. Can take benadryl OTC prn swelling and will send in prescription for nystatin mouthwash as she has white patches and sores on her tongue/mucus membranes. Discussed insulin as an option to control her diabetes in the future if needing further improvement. Discussed ER precautions. Call if symptoms worsen or do not improve.

## 2020-09-13 NOTE — Progress Notes (Signed)
Acute Office Visit  Subjective:    Patient ID: Lisa Galvan, female    DOB: January 12, 1961, 60 y.o.   MRN: [id number]  Chief Complaint  Patient presents with  . Mouth Lesions    Patient states for about a week she has noticed sores inside her mouth, also has a white patchy on the tongue. Patient states there is some pain in mouth when she drinks or eats certain things. Patient states she recently started trulicity 2 weeks ago, not sure if its a reaction from that.     HPI Patient is in today for mouth sores and lip swelling for 1 week. She started trulicity 2 weeks ago and is unsure if this is related to this medication. She has also had nausea and vomiting with both doses of trulicity.   MOUTH LESION  Duration: weeks about 1 week Location: tongue, lip Painful: yes Itching: no Onset: sudden Context: worse Associated signs and symptoms: None Has a history of thrush  Past Medical History:  Diagnosis Date  . Allergy   . Arthritis    spine  . Bilateral leg edema   . Chest pain 12/15/2014  . Chronic sciatica 12/10/2014  . Essential hypertension 12/10/2014  . Gastroesophageal reflux disease without esophagitis   . Mixed hyperlipidemia   . Obesity (BMI 30.0-34.9) 12/10/2014  . Type 2 diabetes mellitus without complication ([place]) 4/65/0354  . Umbilical hernia     Past Surgical History:  Procedure Laterality Date  . ABDOMINAL HYSTERECTOMY    . COLONOSCOPY WITH PROPOFOL N/A 03/30/2019   Procedure: COLONOSCOPY WITH PROPOFOL;  Surgeon: Lin Landsman, MD;  Location: [facility] ENDOSCOPY;  Service: Gastroenterology;  Laterality: N/A;  . ESOPHAGOGASTRODUODENOSCOPY (EGD) WITH PROPOFOL N/A 03/12/2017   Procedure: ESOPHAGOGASTRODUODENOSCOPY (EGD) WITH PROPOFOL;  Surgeon: Lin Landsman, MD;  Location: [facility] ENDOSCOPY;  Service: Gastroenterology;  Laterality: N/A;  . ESOPHAGOGASTRODUODENOSCOPY (EGD) WITH PROPOFOL N/A 03/30/2019   Procedure: ESOPHAGOGASTRODUODENOSCOPY (EGD) WITH PROPOFOL;   Surgeon: Lin Landsman, MD;  Location: [facility] ENDOSCOPY;  Service: Gastroenterology;  Laterality: N/A;  . HERNIA REPAIR    . ORIF ANKLE FRACTURE Left 07/30/2019   Procedure: OPEN REDUCTION INTERNAL FIXATION (ORIF) OF LEFT BIMALLEOLAR ANKLE FRACTURE;  Surgeon: Leim Fabry, MD;  Location: [place];  Service: Orthopedics;  Laterality: Left;  Diabetic - oral meds  . UMBILICAL HERNIA REPAIR N/A 05/01/2017   Procedure: HERNIA REPAIR UMBILICAL ADULT;  Surgeon: Vickie Epley, MD;  Location: [facility] ORS;  Service: General;  Laterality: N/A;  . XI ROBOTIC ASSISTED VENTRAL HERNIA N/A 10/06/2019   Procedure: XI ROBOTIC ASSISTED VENTRAL HERNIA;  Surgeon: Jules Husbands, MD;  Location: [facility] ORS;  Service: General;  Laterality: N/A;    Family History  Problem Relation Age of Onset  . Diabetes Mother   . Cancer Mother        breast and stomach  . Breast cancer Mother 64  . Dementia Mother   . Heart disease Father   . Kidney disease Father        dialysis  . Cancer Father        colon  . Diabetes Father   . Hypertension Sister   . Hypertension Brother   . Congestive Heart Failure Maternal Grandmother   . Diabetes Maternal Grandmother   . Hypertension Maternal Grandmother     Social History   Socioeconomic History  . Marital status: Widowed    Spouse name: Not on file  . Number of children: Not on file  . Years  of education: Not on file  . Highest education level: Not on file  Occupational History  . Not on file  Tobacco Use  . Smoking status: Never Smoker  . Smokeless tobacco: Never Used  Vaping Use  . Vaping Use: Never used  Substance and Sexual Activity  . Alcohol use: Yes    Alcohol/week: 3.0 standard drinks    Types: 3 Glasses of wine per week  . Drug use: No  . Sexual activity: Not Currently  Other Topics Concern  . Not on file  Social History Narrative  . Not on file   Social Determinants of Health   Financial Resource Strain: Low Risk   . Difficulty of  Paying Living Expenses: Not very hard  Food Insecurity: Not on file  Transportation Needs: Not on file  Physical Activity: Not on file  Stress: Not on file  Social Connections: Not on file  Intimate Partner Violence: Not on file    Outpatient Medications Prior to Visit  Medication Sig Dispense Refill  . Accu-Chek Softclix Lancets lancets 2 (two) times daily. use for testing    . ALLERGY RELIEF 180 MG tablet TAKE 1 TABLET BY MOUTH EVERY DAY 90 tablet 3  . amLODipine (NORVASC) 5 MG tablet TAKE 1 TABLET(5 MG) BY MOUTH DAILY 90 tablet 1  . aspirin EC 81 MG tablet Take 81 mg by mouth daily. Swallow whole.    Marland Kitchen atorvastatin (LIPITOR) 20 MG tablet TAKE 1 TABLET(20 MG) BY MOUTH DAILY 90 tablet 1  . carbidopa-levodopa (SINEMET IR) 25-100 MG tablet Take 1.5 tablets by mouth 3 (three) times daily.    . Cholecalciferol (VITAMIN D-3) 5000 units TABS Take 5,000 Units by mouth daily.    . Cyanocobalamin (VITAMIN B-12 PO) Take 1 tablet by mouth 2 (two) times daily.     . DULoxetine (CYMBALTA) 60 MG capsule Take 1 capsule (60 mg total) by mouth daily. 90 capsule 1  . empagliflozin (JARDIANCE) 25 MG TABS tablet Take 1 tablet (25 mg total) by mouth daily. 90 tablet 1  . EPINEPHrine 0.3 mg/0.3 mL IJ SOAJ injection Inject 0.3 mg into the muscle as needed for anaphylaxis. 1 each 12  . etodolac (LODINE) 400 MG tablet Take by mouth.    . gabapentin (NEURONTIN) 100 MG capsule Take 1 capsule (100 mg total) by mouth 3 (three) times daily. 270 capsule 1  . hydrochlorothiazide (HYDRODIURIL) 25 MG tablet Take 1 tablet (25 mg total) by mouth daily. 90 tablet 1  . ketoconazole (NIZORAL) 2 % cream APPLY TOPICALLY TO THE AFFECTED AREA DAILY 60 g 1  . montelukast (SINGULAIR) 10 MG tablet TAKE 1 TABLET(10 MG) BY MOUTH AT BEDTIME 90 tablet 1  . omeprazole (PRILOSEC) 40 MG capsule Take 40 mg by mouth 2 (two) times daily.    . sucralfate (CARAFATE) 1 g tablet Take 1 tablet (1 g total) by mouth 3 (three) times daily. 90 tablet  2  . triamcinolone ointment (KENALOG) 0.5 % Apply 1 application topically 2 (two) times daily. 30 g 0  . valACYclovir (VALTREX) 1000 MG tablet Take 1 tablet (1,000 mg total) by mouth 2 (two) times daily. 20 tablet 0  . Dulaglutide (TRULICITY) 4.25 ZD/6.3OV SOPN Inject 0.75 mg into the skin once a week. 2 mL 2  . metFORMIN (GLUCOPHAGE) 500 MG tablet Take 1 tablet (500 mg total) by mouth 2 (two) times daily with a meal. 180 tablet 1  . Magnesium Oxide 500 MG CAPS Take 1 capsule (500 mg total) by mouth  in the morning. 30 capsule 2   No facility-administered medications prior to visit.    Allergies  Allergen Reactions  . Amlodipine Besy-Benazepril Hcl Anaphylaxis  . Ivp Dye [Iodinated Diagnostic Agents] Anaphylaxis    Pt is unaware   . Shellfish Allergy Swelling and Anaphylaxis  . Shellfish-Derived Products Anaphylaxis  . Ace Inhibitors Swelling  . Lyrica [Pregabalin] Swelling  . Trulicity [Dulaglutide] Swelling    Review of Systems  Constitutional: Positive for fatigue. Negative for fever.  HENT: Positive for facial swelling and mouth sores. Negative for congestion, rhinorrhea and sore throat.   Respiratory: Negative.   Cardiovascular: Negative.   Gastrointestinal: Positive for vomiting (with trulicity).  Genitourinary: Negative.   Musculoskeletal: Negative.   Skin: Negative.   Neurological: Positive for light-headedness (chronic).       Objective:    Physical Exam Vitals and nursing note reviewed.  Constitutional:      General: She is not in acute distress.    Appearance: Normal appearance.  HENT:     Head: Normocephalic.     Mouth/Throat:     Mouth: Mucous membranes are moist. Angioedema present.     Tongue: Lesions (white patch on tongue) present.     Pharynx: Oropharynx is clear.  Eyes:     Conjunctiva/sclera: Conjunctivae normal.  Cardiovascular:     Rate and Rhythm: Normal rate and regular rhythm.     Pulses: Normal pulses.     Heart sounds: Normal heart  sounds.  Pulmonary:     Effort: Pulmonary effort is normal.     Breath sounds: Normal breath sounds.  Musculoskeletal:     Cervical back: Normal range of motion.  Skin:    General: Skin is warm.  Neurological:     General: No focal deficit present.     Mental Status: She is alert and oriented to person, place, and time.  Psychiatric:        Mood and Affect: Mood normal.        Behavior: Behavior normal.        Thought Content: Thought content normal.        Judgment: Judgment normal.     BP 113/83   Pulse 96   Temp 98.2 F (36.8 C)   Wt 183 lb (83 kg)   SpO2 98%   BMI 32.42 kg/m  Wt Readings from Last 3 Encounters:  09/13/20 183 lb (83 kg)  09/01/20 183 lb 12.8 oz (83.4 kg)  07/15/20 187 lb (84.8 kg)    Health Maintenance Due  Topic Date Due  . COVID-19 Vaccine (3 - Booster for Pfizer series) 06/24/2020  . OPHTHALMOLOGY EXAM  08/18/2020    There are no preventive care reminders to display for this patient.   Lab Results  Component Value Date   TSH 0.968 02/18/2020   Lab Results  Component Value Date   WBC 6.2 02/18/2020   HGB 13.4 02/18/2020   HCT 41.1 02/18/2020   MCV 89 02/18/2020   PLT 248 02/18/2020   Lab Results  Component Value Date   NA 140 09/01/2020   K 3.4 (L) 09/01/2020   CO2 24 09/01/2020   GLUCOSE 157 (H) 09/01/2020   BUN 5 (L) 09/01/2020   CREATININE 0.86 09/01/2020   BILITOT 1.0 09/01/2020   ALKPHOS 120 09/01/2020   AST 70 (H) 09/01/2020   ALT 57 (H) 09/01/2020   PROT 6.4 09/01/2020   ALBUMIN 4.2 09/01/2020   CALCIUM 10.1 09/01/2020   ANIONGAP 8 04/24/2017   EGFR  77 09/01/2020   Lab Results  Component Value Date   CHOL 153 09/01/2020   Lab Results  Component Value Date   HDL 95 09/01/2020   Lab Results  Component Value Date   LDLCALC 42 09/01/2020   Lab Results  Component Value Date   TRIG 86 09/01/2020   Lab Results  Component Value Date   CHOLHDL 1.4 01/03/2017   Lab Results  Component Value Date   HGBA1C  8.1 (H) 09/01/2020       Assessment & Plan:   Problem List Items Addressed This Visit      Endocrine   Type 2 diabetes mellitus with diabetic neuropathy, unspecified ([place])    Was started on trulicity 2 weeks ago, now with lip and mouth swelling. Allergic reaction vs thrush. Denies shortness of breath and lung sounds clear on exam. Stop trulicity. Will increase metformin to 1,051m BID. Discussed diet and exercise. Can take benadryl OTC prn swelling and will send in prescription for nystatin mouthwash as she has white patches and sores on her tongue/mucus membranes. Discussed insulin as an option to control her diabetes in the future if needing further improvement. Discussed ER precautions. Call if symptoms worsen or do not improve.       Relevant Medications   metFORMIN (GLUCOPHAGE) 500 MG tablet    Other Visit Diagnoses    Mouth swelling    -  Primary   Will stop trulicity as could be an allergic reaction vs thrush. She can take benadryl and will send in nystatin. F/U if symptoms don't improve or worsen   Thrush       White patches to tongue, will send in prescription for nystatin to pharmacy   Relevant Medications   nystatin (MYCOSTATIN) 100000 UNIT/ML suspension       Meds ordered this encounter  Medications  . metFORMIN (GLUCOPHAGE) 500 MG tablet    Sig: Take 2 tablets (1,000 mg total) by mouth 2 (two) times daily with a meal.    Dispense:  360 tablet    Refill:  1  . nystatin (MYCOSTATIN) 100000 UNIT/ML suspension    Sig: Take 5 mLs (500,000 Units total) by mouth 4 (four) times daily.    Dispense:  60 mL    Refill:  0     LCharyl Dancer NP

## 2020-09-15 ENCOUNTER — Ambulatory Visit: Payer: Self-pay | Admitting: Licensed Clinical Social Worker

## 2020-09-16 NOTE — Chronic Care Management (AMB) (Signed)
Care Management Clinical Social Work Note  09/16/2020 Name: Lisa Galvan MRN: [id number] DOB: 06/23/1960  Lisa Galvan is a 60 y.o. year old female who is a primary care patient of Dorcas Carrow, DO.  The Care Management team was consulted for assistance with chronic disease management and coordination needs.  Engaged with patient by telephone for follow up visit in response to provider referral for social work chronic care management and care coordination services  Consent to Services:  [name] was given information about Care Management services today including:  1. Care Management services includes personalized support from designated clinical staff supervised by her physician, including individualized plan of care and coordination with other care providers 2. 24/7 contact phone numbers for assistance for urgent and routine care needs. 3. The patient may stop case management services at any time by phone call to the office staff.  Patient agreed to services and consent obtained.   Assessment: Patient is currently experiencing symptoms of  stress which seems to be exacerbated by chronic health conditions and pain. Patient continues to receive strong support. Strategies to promote self-care and stress management identified. See Care Plan below for interventions and patient self-care actives. Recent life changes Efrain Sella: Chronic health conditions Recommendation: Patient may benefit from, and is in agreement to continue utilizing strategies discussed in care plan.  Follow up Plan: Patient would like continued follow-up.  CCM LCSW will follow up with patient  on 11/14/20. Patient will call office if needed prior to next encounter.  SDOH (Social Determinants of Health) assessments and interventions performed:    Advanced Directives Status: Not addressed in this encounter.  Care Plan  Allergies  Allergen Reactions  . Amlodipine Besy-Benazepril Hcl Anaphylaxis  . Ivp Dye [Iodinated  Diagnostic Agents] Anaphylaxis    Pt is unaware   . Shellfish Allergy Swelling and Anaphylaxis  . Shellfish-Derived Products Anaphylaxis  . Ace Inhibitors Swelling  . Lyrica [Pregabalin] Swelling  . Trulicity [Dulaglutide] Swelling    Outpatient Encounter Medications as of 09/15/2020  Medication Sig  . Accu-Chek Softclix Lancets lancets 2 (two) times daily. use for testing  . ALLERGY RELIEF 180 MG tablet TAKE 1 TABLET BY MOUTH EVERY DAY  . amLODipine (NORVASC) 5 MG tablet TAKE 1 TABLET(5 MG) BY MOUTH DAILY  . aspirin EC 81 MG tablet Take 81 mg by mouth daily. Swallow whole.  Marland Kitchen atorvastatin (LIPITOR) 20 MG tablet TAKE 1 TABLET(20 MG) BY MOUTH DAILY  . carbidopa-levodopa (SINEMET IR) 25-100 MG tablet Take 1.5 tablets by mouth 3 (three) times daily.  . Cholecalciferol (VITAMIN D-3) 5000 units TABS Take 5,000 Units by mouth daily.  . Cyanocobalamin (VITAMIN B-12 PO) Take 1 tablet by mouth 2 (two) times daily.   . DULoxetine (CYMBALTA) 60 MG capsule Take 1 capsule (60 mg total) by mouth daily.  . empagliflozin (JARDIANCE) 25 MG TABS tablet Take 1 tablet (25 mg total) by mouth daily.  Marland Kitchen EPINEPHrine 0.3 mg/0.3 mL IJ SOAJ injection Inject 0.3 mg into the muscle as needed for anaphylaxis.  Marland Kitchen etodolac (LODINE) 400 MG tablet Take by mouth.  . gabapentin (NEURONTIN) 100 MG capsule Take 1 capsule (100 mg total) by mouth 3 (three) times daily.  . hydrochlorothiazide (HYDRODIURIL) 25 MG tablet Take 1 tablet (25 mg total) by mouth daily.  Marland Kitchen ketoconazole (NIZORAL) 2 % cream APPLY TOPICALLY TO THE AFFECTED AREA DAILY  . Magnesium Oxide 500 MG CAPS Take 1 capsule (500 mg total) by mouth in the morning.  . metFORMIN (GLUCOPHAGE) 500  MG tablet Take 2 tablets (1,000 mg total) by mouth 2 (two) times daily with a meal.  . montelukast (SINGULAIR) 10 MG tablet TAKE 1 TABLET(10 MG) BY MOUTH AT BEDTIME  . nystatin (MYCOSTATIN) 100000 UNIT/ML suspension Take 5 mLs (500,000 Units total) by mouth 4 (four) times daily.   Marland Kitchen omeprazole (PRILOSEC) 40 MG capsule Take 40 mg by mouth 2 (two) times daily.  . sucralfate (CARAFATE) 1 g tablet Take 1 tablet (1 g total) by mouth 3 (three) times daily.  Marland Kitchen triamcinolone ointment (KENALOG) 0.5 % Apply 1 application topically 2 (two) times daily.  . valACYclovir (VALTREX) 1000 MG tablet Take 1 tablet (1,000 mg total) by mouth 2 (two) times daily.   No facility-administered encounter medications on file as of 09/15/2020.    Patient Active Problem List   Diagnosis Date Noted  . Aortic atherosclerosis (HCC) 07/12/2020  . OSA (obstructive sleep apnea) 05/23/2020  . Coccygodynia 02/01/2020  . Facial swelling 07/21/2019  . Acute postoperative pain 06/30/2019  . History of allergy to radiographic contrast media 06/02/2019  . History of allergy to shellfish 06/02/2019  . History of Allergy to iodine 06/02/2019    Class: History of  . History of anaphylaxis 06/02/2019  . Other spondylosis, sacral and sacrococcygeal region 04/21/2019  . Abnormal MRI, cervical spine (02/08/2019) 03/25/2019  . Lower extremity weakness (Bilateral) 03/25/2019  . Coordination impairment (lower extremity) 03/25/2019  . Hypomagnesemia 03/24/2019  . Spondylosis without myelopathy or radiculopathy, lumbosacral region 03/24/2019  . Lumbar Facet Hypertrophy 03/24/2019  . Grade 1 Anterolisthesis of L4/5 03/11/2019  . Chronic hip pain (Bilateral) (R>L) 03/11/2019  . Chronic sacroiliac joint pain (Bilateral) 03/11/2019  . Sacroiliac joint somatic dysfunction (Bilateral) 03/11/2019  . Chronic feet pain (Primary Area of Pain) (Bilateral) 03/11/2019  . Chronic leg and foot pain (Bilateral) 03/11/2019  . Diabetic peripheral neuropathy (HCC) 03/11/2019  . Chronic neuropathic pain 03/11/2019  . Neurogenic pain 03/11/2019  . Chronic musculoskeletal pain 03/11/2019  . Osteoarthritis involving multiple joints 03/11/2019  . Chronic pain syndrome 03/10/2019  . Pharmacologic therapy 03/10/2019  . Disorder of  skeletal system 03/10/2019  . Problems influencing health status 03/10/2019  . Chronic low back pain (Third area of Pain) (Bilateral) w/o sciatica 03/10/2019  . Chronic lower extremity pain (Secondary area of Pain) (Bilateral) 03/10/2019  . Abnormal MRI, lumbar spine (02/08/2019) 03/10/2019  . Essential hypertension 10/06/2018  . Cholelithiasis 06/23/2018  . Fatty liver 06/23/2018  . Helicobacter pylori infection 05/13/2017  . Mastalgia in female 04/09/2017  . Bilateral leg edema 07/02/2016  . DDD (degenerative disc disease), lumbar 12/16/2014  . Lumbar facet syndrome (Bilateral) 12/16/2014  . Sacroiliac joint dysfunction 12/16/2014  . Neuropathy due to secondary diabetes (HCC) 12/16/2014  . Vitamin D deficiency 12/13/2014  . Morbid obesity (HCC) 12/10/2014  . Type 2 diabetes mellitus with diabetic neuropathy, unspecified (HCC) 12/10/2014  . Hyperlipidemia 12/10/2014  . Gastroesophageal reflux disease without esophagitis 12/10/2014    Conditions to be addressed/monitored: Stress; Mental Health Concerns   Care Plan : General Social Work (Adult)  Updates made by Bridgett Larsson, LCSW since 09/16/2020 12:00 AM    Problem: I'm managing my physical and mental health better now   Priority: Medium    Long-Range Goal: Depressive Symptoms Identified   Start Date: 03/21/2020  This Visit's Progress: On track  Priority: Medium  Note:   Current Barriers:  . Limited social support . Level of care concerns . ADL IADL limitations . Social Isolation . Limited access to  caregiver  Clinical Social Work Clinical Goal(s):  Marland Kitchen Over the next 120 days, patient will work with SW to address concerns related to gaining additional support/resource connection in order to maintain health  Interventions: . Patient interviewed and appropriate assessments performed . Patient reports difficulty managing ongoing stressors. Patient's mother has dementia and is residing in [place], [place]. Patient also suffered  from a recent allergic reaction to medications, in addition, to an upcoming surgery to correct a hernia. Patient feels that medications often make her feel "not like myself" which results in withdrawn behavior and at times, low motivation. Denies SI/HI . Patient reports having a strong support system that includes her best friend and church family. Patient's daughter will arrive on Monday with patient's granddaughter to provide additional support . CCM provided support and validation. Psychoeducation was provided on how physical and mental health correlates with one another. LCSW commended patient for having strong insight of body and advocating for self regarding her medical treatment. Strategies to promote self-care and stress management was identified. Patient was successful in identifying protective factors to increase motivation . Advised patient to contact CCM LCSW if future social work needs arise. . Discussed plans with patient for ongoing care management follow up and provided patient with direct contact information for care management team . Inter-disciplinary care team collaboration (see longitudinal plan of care)  Patient Self Care Activities:  . Attend all scheduled appointments with providers . Call provider office for new concerns or questions . Utilize strategies discussed to assist in managing stress and promote self-care       Jenel Lucks, MSW, LCSW [facility] Care Management   [facility] [email or web address] Phone [phone] 10:03 AM

## 2020-09-16 NOTE — Patient Instructions (Signed)
Visit Information  Goals Addressed              This Visit's Progress     Patient Stated   .  SW: "I need more support and resource information" (pt-stated)   On track     Timeframe:  Long-Range Goal Priority:  Medium Start Date:   06/20/20                        Expected End Date:  11/19/20                     Follow Up Date- 11/14/20   Patient Self Care Activities:  . Attend all scheduled appointments with providers . Call provider office for new concerns or questions . Utilize s      Other   .  SW-Make and Keep All Appointments   On track     Timeframe:  Long-Range Goal Priority:  Medium Start Date:   06/20/20                        Expected End Date:  11/19/20                     Follow Up Date- 11/14/20   Patient Self Care Activities:  . Attend all scheduled appointments with providers . Call provider office for new concerns or questions . Utilize strategies discussed to assist in managing stress and promote self-care       Patient verbalizes understanding of instructions provided today and agrees to view in MyChart.   Telephone follow up appointment with care management team member scheduled for:11/14/20  Jenel Lucks, MSW, LCSW [facility] Care Management [facility]  [facility] [email or web address] Phone [phone] 10:03 AM

## 2020-09-21 ENCOUNTER — Ambulatory Visit: Payer: BLUE CROSS/BLUE SHIELD | Admitting: Surgery

## 2020-09-21 DIAGNOSIS — G20 Parkinson's disease: Secondary | ICD-10-CM | POA: Diagnosis not present

## 2020-09-22 ENCOUNTER — Other Ambulatory Visit: Payer: Self-pay | Admitting: Nurse Practitioner

## 2020-09-22 ENCOUNTER — Telehealth: Payer: Self-pay

## 2020-09-22 MED ORDER — PREDNISONE 10 MG PO TABS: ORAL_TABLET | ORAL | 0 refills | Status: DC

## 2020-09-22 NOTE — Telephone Encounter (Signed)
Called and left a detailed VM letting patient know what Lauren said. Asked for patient to call back with any questions or concerns.

## 2020-09-22 NOTE — Progress Notes (Signed)
Nystatin mouthwash has not helped with lip swelling, redness, and white patches. Will send in a prednisone taper. If symptoms are still present on Monday, call and schedule an appointment to be seen.

## 2020-09-22 NOTE — Telephone Encounter (Signed)
Please advise pt seen 5/24 by Leotis Shames  Copied from CRM [id number]. Topic: General - Other >> Sep 22, 2020 12:15 PM Aretta Nip wrote: Pt was seen for white patches in mouth, med is not working,  the patches are about same and lips are red and swollen. Pls contact pt. [phone]

## 2020-09-26 ENCOUNTER — Other Ambulatory Visit: Payer: Self-pay

## 2020-09-26 ENCOUNTER — Ambulatory Visit: Payer: BLUE CROSS/BLUE SHIELD | Admitting: Surgery

## 2020-09-26 VITALS — BP 104/74 | HR 99 | Temp 98.6°F | Ht 63.0 in | Wt 178.0 lb

## 2020-09-26 DIAGNOSIS — K432 Incisional hernia without obstruction or gangrene: Secondary | ICD-10-CM

## 2020-09-26 NOTE — Patient Instructions (Addendum)
Our surgery scheduler will call to schedule your surgery within 24-48 hours. Please have the Blue surgery sheet available when speaking with her.     Hernia, Adult     A hernia happens when tissue inside your body pushes out through a weak spot in your belly muscles (abdominal wall). This makes a round lump (bulge). The lump may be:  In a scar from surgery that was done in your belly (incisional hernia).  Near your belly button (umbilical hernia).  In your groin (inguinal hernia). Your groin is the area where your leg meets your lower belly (abdomen). This kind of hernia could also be: ? In your scrotum, if you are female. ? In folds of skin around your vagina, if you are female.  In your upper thigh (femoral hernia).  Inside your belly (hiatal hernia). This happens when your stomach slides above the muscle between your belly and your chest (diaphragm). If your hernia is small and it does not cause pain, you may not need treatment. If your hernia is large or it causes pain, you may need surgery. Follow these instructions at home: Activity  Avoid stretching or overusing (straining) the muscles near your hernia. Straining can happen when you: ? Lift something heavy. ? Poop (have a bowel movement).  Do not lift anything that is heavier than 10 lb (4.5 kg), or the limit that you are told, until your doctor says that it is safe.  Use the strength of your legs when you lift something heavy. Do not use only your back muscles to lift. General instructions  Do these things if told by your doctor so you do not have trouble pooping (constipation): ? Drink enough fluid to keep your pee (urine) pale yellow. ? Eat foods that are high in fiber. These include fresh fruits and vegetables, whole grains, and beans. ? Limit foods that are high in fat and processed sugars. These include foods that are fried or sweet. ? Take medicine for trouble pooping.  When you cough, try to cough gently.  You  may try to push your hernia in by very gently pressing on it when you are lying down. Do not try to force the bulge back in if it will not push in easily.  If you are overweight, work with your doctor to lose weight safely.  Do not use any products that have nicotine or tobacco in them. These include cigarettes and e-cigarettes. If you need help quitting, ask your doctor.  If you will be having surgery (hernia repair), watch your hernia for changes in shape, size, or color. Tell your doctor if you see any changes.  Take over-the-counter and prescription medicines only as told by your doctor.  Keep all follow-up visits as told by your doctor. Contact a doctor if:  You get new pain, swelling, or redness near your hernia.  You poop fewer times in a week than normal.  You have trouble pooping.  You have poop (stool) that is more dry than normal.  You have poop that is harder or larger than normal. Get help right away if:  You have a fever.  You have belly pain that gets worse.  You feel sick to your stomach (nauseous).  You throw up (vomit).  Your hernia cannot be pushed in by very gently pressing on it when you are lying down. Do not try to force the bulge back in if it will not push in easily.  Your hernia: ? Changes in shape or  size. ? Changes color. ? Feels hard or it hurts when you touch it. These symptoms may represent a serious problem that is an emergency. Do not wait to see if the symptoms will go away. Get medical help right away. Call your local emergency services (911 in the [place] Summary  A hernia happens when tissue inside your body pushes out through a weak spot in the belly muscles. This creates a bulge.  If your hernia is small and it does not hurt, you may not need treatment. If your hernia is large or it hurts, you may need surgery.  If you will be having surgery, watch your hernia for changes in shape, size, or color. Tell your doctor about any  changes. This information is not intended to replace advice given to you by your health care provider. Make sure you discuss any questions you have with your health care provider. Document Revised: 07/31/2018 Document Reviewed: 01/09/2017 Elsevier Patient Education  2021 [employer].

## 2020-09-27 ENCOUNTER — Telehealth: Payer: Self-pay | Admitting: Surgery

## 2020-09-27 NOTE — Telephone Encounter (Signed)
Patient has been advised of Pre-Admission date/time, COVID Testing date and Surgery date.  Surgery Date: 10/13/20 Preadmission Testing Date: 10/07/20 (phone 8a-1p) Covid Testing Date: Not needed.    Patient has been made aware to call [phone], between 1-3:00pm the day before surgery, to find out what time to arrive for surgery.

## 2020-09-28 ENCOUNTER — Encounter: Payer: Self-pay | Admitting: Surgery

## 2020-09-28 NOTE — Progress Notes (Signed)
Outpatient Surgical Follow Up  09/28/2020  Lisa Galvan is an 60 y.o. female.   Chief Complaint  Patient presents with  . Follow-up    Incisional hernia    HPI: 60 yo female with hx of robotic recurrent ventral hernia repair by me  1 year ago. She also had significant reflux symptoms that currently are well managed with medication. Now comes with a left subcostal hernia from my robotic repair. Last few months the defect has enlarged in size She also had a recent CT scan that have personally reviewed confirming the left subcostal incisional hernia, there is also evidence of transverse colon within the hernia sac.Marland KitchenNo fevers no chills. No nausea no vomiting.   SHe does report some intermittent pain around the hernia.  It is moderate to mild intensity worsening with Valsalva. She Does have a history of chronic back pain and she follows with the pain clinic.  Past Medical History:  Diagnosis Date  . Allergy   . Arthritis    spine  . Bilateral leg edema   . Chest pain 12/15/2014  . Chronic sciatica 12/10/2014  . Essential hypertension 12/10/2014  . Gastroesophageal reflux disease without esophagitis   . Mixed hyperlipidemia   . Obesity (BMI 30.0-34.9) 12/10/2014  . Type 2 diabetes mellitus without complication (HCC) 12/10/2014  . Umbilical hernia     Past Surgical History:  Procedure Laterality Date  . ABDOMINAL HYSTERECTOMY    . COLONOSCOPY WITH PROPOFOL N/A 03/30/2019   Procedure: COLONOSCOPY WITH PROPOFOL;  Surgeon: Toney Reil, MD;  Location: [facility] ENDOSCOPY;  Service: Gastroenterology;  Laterality: N/A;  . ESOPHAGOGASTRODUODENOSCOPY (EGD) WITH PROPOFOL N/A 03/12/2017   Procedure: ESOPHAGOGASTRODUODENOSCOPY (EGD) WITH PROPOFOL;  Surgeon: Toney Reil, MD;  Location: [facility] ENDOSCOPY;  Service: Gastroenterology;  Laterality: N/A;  . ESOPHAGOGASTRODUODENOSCOPY (EGD) WITH PROPOFOL N/A 03/30/2019   Procedure: ESOPHAGOGASTRODUODENOSCOPY (EGD) WITH PROPOFOL;  Surgeon: Toney Reil, MD;  Location: [facility] ENDOSCOPY;  Service: Gastroenterology;  Laterality: N/A;  . HERNIA REPAIR    . ORIF ANKLE FRACTURE Left 07/30/2019   Procedure: OPEN REDUCTION INTERNAL FIXATION (ORIF) OF LEFT BIMALLEOLAR ANKLE FRACTURE;  Surgeon: Signa Kell, MD;  Location: [facility];  Service: Orthopedics;  Laterality: Left;  Diabetic - oral meds  . UMBILICAL HERNIA REPAIR N/A 05/01/2017   Procedure: HERNIA REPAIR UMBILICAL ADULT;  Surgeon: Ancil Linsey, MD;  Location: [facility] ORS;  Service: General;  Laterality: N/A;  . XI ROBOTIC ASSISTED VENTRAL HERNIA N/A 10/06/2019   Procedure: XI ROBOTIC ASSISTED VENTRAL HERNIA;  Surgeon: Leafy Ro, MD;  Location: [facility] ORS;  Service: General;  Laterality: N/A;    Family History  Problem Relation Age of Onset  . Diabetes Mother   . Cancer Mother        breast and stomach  . Breast cancer Mother 32  . Dementia Mother   . Heart disease Father   . Kidney disease Father        dialysis  . Cancer Father        colon  . Diabetes Father   . Hypertension Sister   . Hypertension Brother   . Congestive Heart Failure Maternal Grandmother   . Diabetes Maternal Grandmother   . Hypertension Maternal Grandmother     Social History:  reports that she has never smoked. She has never used smokeless tobacco. She reports current alcohol use of about 3.0 standard drinks of alcohol per week. She reports that she does not use drugs.  Allergies:  Allergies  Allergen  Reactions  . Amlodipine Besy-Benazepril Hcl Anaphylaxis  . Ivp Dye [Iodinated Diagnostic Agents] Anaphylaxis    Pt is unaware   . Shellfish Allergy Swelling and Anaphylaxis  . Shellfish-Derived Products Anaphylaxis  . Ace Inhibitors Swelling  . Lyrica [Pregabalin] Swelling  . Trulicity [Dulaglutide] Swelling    Medications reviewed.    ROS Full ROS performed and is otherwise negative other than what is stated in HPI   BP 104/74   Pulse 99   Temp 98.6 F (37 C) (Oral)    Ht 5\' 3"  (1.6 m)   Wt 178 lb (80.7 kg)   SpO2 97%   BMI 31.53 kg/m   Physical Exam Vitals and nursing note reviewed. Exam conducted with a chaperone present.  Constitutional:      General: She is not in acute distress.    Appearance: Normal appearance. She is obese.  Eyes:     General:        Right eye: No discharge.        Left eye: No discharge.  Cardiovascular:     Rate and Rhythm: Normal rate and regular rhythm.     Pulses: Normal pulses.     Heart sounds: No friction rub.  Pulmonary:     Effort: No respiratory distress.     Breath sounds: Normal breath sounds. No stridor.  Abdominal:     General: Abdomen is flat. There is no distension.     Palpations: Abdomen is soft. There is no mass.     Tenderness: There is no abdominal tenderness. There is no guarding or rebound.     Hernia: A hernia is present Left subcostal Area, reducible  Musculoskeletal:        General: No swelling or tenderness. Normal range of motion.     Cervical back: Normal range of motion and neck supple. No rigidity or tenderness.  Lymphadenopathy:     Cervical: No cervical adenopathy.  Skin:    General: Skin is warm and dry.     Capillary Refill: Capillary refill takes less than 2 seconds.  Neurological:     General: No focal deficit present.     Mental Status: She is alert and oriented to person, place, and time.  Psychiatric:        Mood and Affect: Mood normal.        Behavior: Behavior normal.        Thought Content: Thought content normal.        Judgment: Judgment normal.      Assessment/Plan:  1. Incisional hernia, without obstruction or gangrene  Left subcostal area. D/W the pt in detail.  Discussed with the patient in detail I do think that the best option for her is to perform an open repair with mesh. This is given the high likelihood for her developing a hernia or any of the incision ports.  Procedure discussed with patient in detail.  Risks, benefits and possible complications  including but not limited to: Bleeding, infection, recurrence, bowel injuries.  She understands and wishes to proceed.    Greater than 50% of the 40 minutes  visit was spent in counseling/coordination of care   , MD [facility] General Surgeon

## 2020-10-03 ENCOUNTER — Other Ambulatory Visit: Payer: Self-pay | Admitting: Family Medicine

## 2020-10-03 NOTE — Telephone Encounter (Signed)
Pt last apt on 09/01/2020 next apt on 12/02/2020

## 2020-10-03 NOTE — Telephone Encounter (Signed)
   Notes to clinic: this script has expired Review for continued use and refill   Requested Prescriptions  Pending Prescriptions Disp Refills   omeprazole (PRILOSEC) 40 MG capsule [Pharmacy Med Name: OMEPRAZOLE 40MG  CAPSULES] 90 capsule     Sig: TAKE 1 CAPSULE(40 MG) BY MOUTH DAILY BEFORE BREAKFAST      Gastroenterology: Proton Pump Inhibitors Passed - 10/03/2020 10:04 AM      Passed - Valid encounter within last 12 months    Recent Outpatient Visits           2 weeks ago Mouth swelling   [facility] [place], Lauren A, NP   1 month ago Essential hypertension   [facility] [place], [place], DO   2 months ago Breast pain   [facility] [place], Megan P, DO   4 months ago Type 2 diabetes mellitus with diabetic neuropathy, without long-term current use of insulin (HCC)   [facility] [place], Megan P, DO   7 months ago Routine general medical examination at a health care facility   [facility] [place], [place], DO       Future Appointments             In 2 months [place], Laural Benes, DO [facility] Rud, [facility]

## 2020-10-04 ENCOUNTER — Telehealth: Payer: Self-pay | Admitting: Surgery

## 2020-10-04 NOTE — Telephone Encounter (Signed)
Updated information regarding rescheduled surgery.  Patient has been advised of Pre-Admission date/time, COVID Testing date and Surgery date.  Surgery Date: 10/18/20 Preadmission Testing Date: 10/07/20 (phone 8a-1p) Covid Testing Date: Not needed.     Patient has been made aware to call [phone], between 1-3:00pm the day before surgery, to find out what time to arrive for surgery.

## 2020-10-07 ENCOUNTER — Encounter
Admission: RE | Admit: 2020-10-07 | Discharge: 2020-10-07 | Disposition: A | Discharge status: Home / Self Care | Payer: BLUE CROSS/BLUE SHIELD | Source: Ambulatory Visit | Attending: Surgery | Admitting: Surgery

## 2020-10-07 ENCOUNTER — Other Ambulatory Visit: Payer: Self-pay

## 2020-10-07 DIAGNOSIS — I10 Essential (primary) hypertension: Secondary | ICD-10-CM | POA: Diagnosis not present

## 2020-10-07 HISTORY — DX: Sleep apnea, unspecified: G47.30

## 2020-10-07 HISTORY — DX: Anemia, unspecified: D64.9

## 2020-10-07 NOTE — Patient Instructions (Addendum)
INSTRUCTIONS FOR SURGERY     Your surgery is scheduled for:   Tuesday, June 28TH     To find out your arrival time for the day of surgery,          please call [phone] between 1 pm and 3 pm on :  Monday, June 27TH     When you arrive for surgery, report to the REGISTRATION DESK ON THE FIRST FLOOR OF  THE MEDICAL MALL. ONCE THEY HAVE COMPLETED THEIR PROCESS, PROCEED TO THE SECOND FLOOR AND SIGN IN AT THE SURGERY DESK.   REMEMBER: Instructions that are not followed completely may result in serious medical risk,  up to and including death, or upon the discretion of your surgeon and anesthesiologist,            your surgery may need to be rescheduled.  __X__ 1. Do not eat food after midnight the night before your procedure.                    No gum, candy, lozenger, tic tacs, tums or hard candies.                  ABSOLUTELY NOTHING SOLID IN YOUR MOUTH AFTER MIDNIGHT                    You may drink unlimited clear liquids up to 2 hours before you are scheduled to arrive for surgery.                                            (AS A DIABETIC, BEST THING TO DRINK IS WATER!!)                  Do not drink anything within those 2 hours unless you need to take medicine, then take the                   smallest amount you need.  Clear liquids include:  water, apple juice without pulp,                   any flavor Gatorade, Black coffee, black tea.  Sugar may be added but no dairy/ honey /lemon.                        Broth and jello is not considered a clear liquid.  __x__  2. On the morning of surgery, please brush your teeth with toothpaste and water. You may rinse with                  mouthwash if you wish but DO NOT SWALLOW TOOTHPASTE OR MOUTHWASH  __X___3. NO alcohol for 24 hours before or after surgery.  __x___ 4.  Do NOT smoke or use e-cigarettes for 24 HOURS PRIOR TO SURGERY.                      DO NOT  Use any chewable  tobacco products for at least 6 hours  prior to surgery.  __x___ 5. If you start any new medication after this appointment and prior to surgery, please                   Bring it with you on the day of surgery.  ___x__ 6. Notify your doctor if there is any change in your medical condition, such as fever,                   infection, vomitting, diarrhea or any open sores.  __x___ 7.  USE the CHG SOAP as instructed, the night before surgery and the day of surgery.                   Once you have washed with this soap, do NOT use any of the following: Powders, perfumes                    or lotions. Please do not wear make up, hairpins, clips or nail polish. You may wear deodorant.                                                      Women need to shave 48 hours prior to surgery.                   DO NOT wear ANY jewelry on the day of surgery. If there are rings that are too tight to                    remove easily, please address this prior to the surgery day. Piercings need to be removed.                                                                     NO METAL ON YOUR BODY.                    Do NOT bring any valuables.  If you came to Pre-Admit testing then you will not need license,                     insurance card or credit card.  If you will be staying overnight, please either leave your things in                     the car or have your family be responsible for these items.                     [place] IS NOT RESPONSIBLE FOR BELONGINGS OR VALUABLES.  ___X__ 8. DO NOT wear contact lenses on surgery day.  You may not have dentures,                     Hearing aides, contacts or glasses in the operating room. These items can be                    Placed in the Recovery Room to receive immediately after surgery.  __x___ 9. IF YOU ARE SCHEDULED TO GO HOME ON THE SAME DAY, YOU MUST                   Have someone to drive you home and to stay with you  for the first 24 hours.                     Have an arrangement prior to arriving on surgery day.  ___x__ 10. Take the following medications on the morning of surgery with a sip of water:                              1. NEURONTIN                     2. CYMBALTA                     3. AMLODIPINE                     4. CARAFATE                     5. PRILOSEC(take an extra dose the night before surgery also)                     6. ALLERGY RELIEF MEDICINE    __X__  12. STOP ALL ASPIRIN PRODUCTS ONE WEEK PRIOR TO SURGERY(10/11/20)                       THIS INCLUDES BC POWDERS / GOODIES POWDER  __x___ 13. STOP Anti-inflammatories as of ONE WEEK PRIOR TO SURGERY (10/11/20)                      This includes IBUPROFEN / MOTRIN / ADVIL / ALEVE/ NAPROXYN                    YOU MAY TAKE TYLENOL ANY TIME PRIOR TO SURGERY.  __X___ 14.  Stop supplements until after surgery.                     This includes: MAGNESIUM OXIDE // VITAMIN B12 // VITAMIN D               __X____16.  Stop Metformin 2 full days prior to surgery. LAST DOSE ON Saturday 10/15/20                      STOP JARDIANCE 3 full days prior to surgery.  LAST DOSE ON Sunday 10/16/20                     Do NOT take any diabetes medications on surgery day.  ___X___17.  Continue to take the following medications but do not take on the morning of surgery:                              HYDROCHLOROTHIAZIDE  ___X___18. Wear clean and comfortable clothing to the hospital. SOMETHING LOOSE AND STRETCHY WOULD PROBABLY BE BEST.  BRING PHONE NUMBERS FOR YOUR CONTACTS. HAVE STOOL SOFTENERS AT HOME TO USE AFTER SURGERY.  SUGGEST YOU START   TAKING THIS 1-2 DAYS PRIOR TO SURGERY.  CONTINUE TAKING ALL YOUR EVENING MEDICATIONS AS USUAL.

## 2020-10-10 ENCOUNTER — Encounter: Payer: Self-pay | Admitting: Urgent Care

## 2020-10-10 ENCOUNTER — Other Ambulatory Visit
Admission: RE | Admit: 2020-10-10 | Discharge: 2020-10-10 | Disposition: A | Discharge status: Home / Self Care | Payer: BLUE CROSS/BLUE SHIELD | Source: Ambulatory Visit | Attending: Surgery | Admitting: Surgery

## 2020-10-10 ENCOUNTER — Other Ambulatory Visit: Payer: Self-pay

## 2020-10-10 ENCOUNTER — Encounter: Payer: Self-pay | Admitting: Emergency Medicine

## 2020-10-10 ENCOUNTER — Emergency Department
Admission: EM | Admit: 2020-10-10 | Discharge: 2020-10-10 | Disposition: A | Discharge status: Home / Self Care | Payer: BLUE CROSS/BLUE SHIELD | Attending: Emergency Medicine | Admitting: Emergency Medicine

## 2020-10-10 DIAGNOSIS — Z79899 Other long term (current) drug therapy: Secondary | ICD-10-CM | POA: Insufficient documentation

## 2020-10-10 DIAGNOSIS — E782 Mixed hyperlipidemia: Secondary | ICD-10-CM | POA: Diagnosis not present

## 2020-10-10 DIAGNOSIS — I10 Essential (primary) hypertension: Secondary | ICD-10-CM | POA: Insufficient documentation

## 2020-10-10 DIAGNOSIS — E876 Hypokalemia: Secondary | ICD-10-CM | POA: Diagnosis not present

## 2020-10-10 DIAGNOSIS — Z7982 Long term (current) use of aspirin: Secondary | ICD-10-CM | POA: Diagnosis not present

## 2020-10-10 DIAGNOSIS — E1142 Type 2 diabetes mellitus with diabetic polyneuropathy: Secondary | ICD-10-CM | POA: Diagnosis not present

## 2020-10-10 DIAGNOSIS — Z01818 Encounter for other preprocedural examination: Secondary | ICD-10-CM | POA: Insufficient documentation

## 2020-10-10 DIAGNOSIS — R799 Abnormal finding of blood chemistry, unspecified: Secondary | ICD-10-CM | POA: Diagnosis not present

## 2020-10-10 DIAGNOSIS — E1169 Type 2 diabetes mellitus with other specified complication: Secondary | ICD-10-CM | POA: Diagnosis not present

## 2020-10-10 DIAGNOSIS — Z20822 Contact with and (suspected) exposure to covid-19: Secondary | ICD-10-CM | POA: Insufficient documentation

## 2020-10-10 DIAGNOSIS — Z7984 Long term (current) use of oral hypoglycemic drugs: Secondary | ICD-10-CM | POA: Diagnosis not present

## 2020-10-10 LAB — BASIC METABOLIC PANEL
Anion gap: 13 (ref 5–15)
Anion gap: 14 (ref 5–15)
BUN: <5 mg/dL — ABNORMAL LOW (ref 8–23)
BUN: <5 mg/dL — ABNORMAL LOW (ref 8–23)
CO2: 29 mmol/L (ref 22–32)
CO2: 29 mmol/L (ref 22–32)
Calcium: 9.6 mg/dL (ref 8.9–10.3)
Calcium: 9.6 mg/dL (ref 8.9–10.3)
Chloride: 95 mmol/L — ABNORMAL LOW (ref 98–111)
Chloride: 91 mmol/L — ABNORMAL LOW (ref 98–111)
Creatinine, Ser: 0.72 mg/dL (ref 0.44–1.00)
Creatinine, Ser: 0.74 mg/dL (ref 0.44–1.00)
GFR, Estimated: >60 mL/min (ref >60)
GFR, Estimated: >60 mL/min (ref >60)
Glucose, Bld: 217 mg/dL — ABNORMAL HIGH (ref 70–99)
Glucose, Bld: 202 mg/dL — ABNORMAL HIGH (ref 70–99)
Potassium: 2.4 mmol/L — CL (ref 3.5–5.1)
Potassium: 2.4 mmol/L — CL (ref 3.5–5.1)
Sodium: 137 mmol/L (ref 135–145)
Sodium: 134 mmol/L — ABNORMAL LOW (ref 135–145)

## 2020-10-10 LAB — CBC
HCT: 40.7 % (ref 36.0–46.0)
HCT: 42.1 % (ref 36.0–46.0)
Hemoglobin: 14.9 g/dL (ref 12.0–15.0)
Hemoglobin: 15.2 g/dL — ABNORMAL HIGH (ref 12.0–15.0)
MCH: 31.1 pg (ref 26.0–34.0)
MCH: 31 pg (ref 26.0–34.0)
MCHC: 36.6 g/dL — ABNORMAL HIGH (ref 30.0–36.0)
MCHC: 36.1 g/dL — ABNORMAL HIGH (ref 30.0–36.0)
MCV: 85 fL (ref 80.0–100.0)
MCV: 85.7 fL (ref 80.0–100.0)
Platelets: 282 10*3/uL (ref 150–400)
Platelets: 303 10*3/uL (ref 150–400)
RBC: 4.79 MIL/uL (ref 3.87–5.11)
RBC: 4.91 MIL/uL (ref 3.87–5.11)
RDW: 14 % (ref 11.5–15.5)
RDW: 14.3 % (ref 11.5–15.5)
WBC: 4.7 10*3/uL (ref 4.0–10.5)
WBC: 5.6 10*3/uL (ref 4.0–10.5)
nRBC: 0 % (ref 0.0–0.2)
nRBC: 0 % (ref 0.0–0.2)

## 2020-10-10 LAB — RESP PANEL BY RT-PCR (FLU A&B, COVID) ARPGX2
Influenza A by PCR: NEGATIVE
Influenza B by PCR: NEGATIVE
SARS Coronavirus 2 by RT PCR: NEGATIVE

## 2020-10-10 LAB — TROPONIN I (HIGH SENSITIVITY): Troponin I (High Sensitivity): 6 ng/L (ref <18)

## 2020-10-10 MED ORDER — LOSARTAN POTASSIUM 25 MG PO TABS: 25.0000 mg | ORAL_TABLET | Freq: Every day | ORAL | 0 refills | Status: DC

## 2020-10-10 MED ORDER — POTASSIUM CHLORIDE IN NACL 20-0.9 MEQ/L-% IV SOLN
Freq: Once | INTRAVENOUS | Status: AC
  Filled 2020-10-10: qty 1000

## 2020-10-10 MED ORDER — POTASSIUM CHLORIDE CRYS ER 10 MEQ PO TBCR: 10.0000 meq | EXTENDED_RELEASE_TABLET | Freq: Two times a day (BID) | ORAL | 0 refills | Status: DC

## 2020-10-10 MED ORDER — POTASSIUM CHLORIDE 20 MEQ PO PACK
80.0000 meq | PACK | Freq: Once | ORAL | Status: AC
  Administered 2020-10-10: 80 meq via ORAL
  Filled 2020-10-10: qty 4

## 2020-10-10 NOTE — ED Triage Notes (Signed)
Was having pre-op blood work drawn today and K+:  2.4  patient also states EKG was abnormal.

## 2020-10-10 NOTE — Progress Notes (Addendum)
  Perioperative Services Pre-Admission/Anesthesia Testing    Date: 10/10/20  Name: Lisa Galvan MRN:   [id number]  Re: HYPOkalemia and EKG changes   Case: [id number] Date/Time: 10/18/20 1513   Procedure: HERNIA REPAIR VENTRAL ADULT, open   Anesthesia type: General   Pre-op diagnosis: ventral hernia   Location: [facility] OR ROOM 04 / [facility] ORS FOR ANESTHESIA GROUP   Surgeons: Leafy Ro, MD   Patient is scheduled for the above procedure on 10/18/2020 with Dr. Sterling Big.  In preparation for procedure, patient presented to the PAT office on 10/10/2020 for ECG and lab evaluation.  Patient with presumably acute ECG changes (anterior and inferior TWI's, and T wave flattening in leads I and V6).  Additionally, QTc somewhat prolonged at 484 ms. Copy of ECG sent to patient's primary attending cardiology for review and comparison to last tracing done in his office as I am unable to view tracing in Care Everywhere.     Received communication of critical K+ level of 2.4 mmol/L.  This could account for the acute ECG changes. 12 lead reviewed with attending anesthesiologist Pernell Dupre, MD) and lab values discussed.  I reviewed my plan to have patient present to the ED for further evaluation and retesting of her critical K+ level; MD in agreement.  Contacted patient's primary attending cardiologist Gwen Pounds, MD) to make him aware that since ECG performed, critical K+ level had been received, and patient was being sent to the ED for further evaluation and management.  Cardiologist to review ECG.  Patient's primary attending surgeon Everlene Farrier, MD) updated on plan of care for this patient as it stands at this point.  Called patient to discuss the aforementioned.  Patient to present to the ED via POV for further evaluation and management of the noted clinical findings.  Patient report called to first nurse Penni Bombard, RN).  [facility]  Peri-operative Services Nurse Practitioner Phone: [phone] 10/10/20 12:56 PM

## 2020-10-10 NOTE — Progress Notes (Signed)
  [facility] Perioperative Services: Pre-Admission/Anesthesia Testing  Abnormal Lab Notification   Date: 10/10/20  Name: Lisa Galvan MRN:   [id number]  Re: Abnormal labs noted during PAT appointment   Provider(s) Notified: Leafy Ro, MD Notification mode: Routed and/or faxed via [facility]   ABNORMAL LAB VALUE(S): Lab Results  Component Value Date   K 2.4 (LL) 10/10/2020   Notes:  Patient is scheduled for a OPEN VENTRAL HERNIA REPAIR on 10/18/2020. In review of patient's medication reconciliation, it is noted the patient is on daily thiazide diuretic therapy (HCTZ 25 mg).  Patient with inferior and anterior TWI noted on ECG tracing today when compared to previous. I have sent her ECG to her primary attending cardiologist for review as well to determine if the noted changes were present on tracings performed in his office as I am unable to view in Care Everywhere.   Sending result to primary attending surgeon for review. With ECG changes, it may be prudent for patient to be seen in the ED for further evaluation versus attempting oral supplementation at home. I did not see the patient today in clinic, thus I am unsure if there are any associated angina/anginal equivalent symptoms.    Order placed for K+ level to be rechecked on the day of surgery to ensure optimization.    Quentin Mulling, MSN, APRN, FNP-C, CEN [facility]  Peri-operative Services Nurse Practitioner Phone: [phone] Fax: [phone] 10/10/20 11:59 AM

## 2020-10-10 NOTE — Progress Notes (Deleted)
Error

## 2020-10-10 NOTE — ED Provider Notes (Signed)
[facility] Emergency Department Provider Note   ____________________________________________   Event Date/Time   First MD Initiated Contact with Patient 10/10/20 1444     (approximate)  I have reviewed the triage vital signs and the nursing notes.   HISTORY  Chief Complaint Abnormal Lab (Hyhpokalemia/)    HPI Lisa Galvan is a 60 y.o. female with below stated past medical history who presents with no active chief complaints other than being by her primary care physician that her potassium was low.  She does not know what level her potassium is however per report by our triage nursing staff it was 2.4.  Patient also slightly tachycardic upon arrival that patient states is due to stress.  She denies any complaints at this time other than mild generalized fatigue over the past few weeks.  Patient denies any medication changes but does endorse using diuretics as well as having 1 week of vomiting/diarrhea approximately 1 week prior to arrival.  Patient has not had any nausea/vomiting/diarrhea in over 7 days.  Patient denies any recent sick contacts or recent travel.  Patient currently denies any vision changes, tinnitus, difficulty speaking, facial droop, sore throat, chest pain, shortness of breath, abdominal pain, nausea/vomiting/diarrhea, dysuria, or weakness/numbness/paresthesias in any extremity         Past Medical History:  Diagnosis Date   Allergy    Anemia    Arthritis    spine   Bilateral leg edema    Chest pain 12/15/2014   Chronic sciatica 12/10/2014   Essential hypertension 12/10/2014   Gastroesophageal reflux disease without esophagitis    Mixed hyperlipidemia    Obesity (BMI 30.0-34.9) 12/10/2014   Sleep apnea    waiting for cpap   Type 2 diabetes mellitus without complication (HCC) 12/10/2014   Umbilical hernia     Patient Active Problem List   Diagnosis Date Noted   Aortic atherosclerosis (HCC) 07/12/2020   OSA (obstructive sleep  apnea) 05/23/2020   Coccygodynia 02/01/2020   Facial swelling 07/21/2019   Acute postoperative pain 06/30/2019   History of allergy to radiographic contrast media 06/02/2019   History of allergy to shellfish 06/02/2019   History of Allergy to iodine 06/02/2019    Class: History of   History of anaphylaxis 06/02/2019   Other spondylosis, sacral and sacrococcygeal region 04/21/2019   Abnormal MRI, cervical spine (02/08/2019) 03/25/2019   Lower extremity weakness (Bilateral) 03/25/2019   Coordination impairment (lower extremity) 03/25/2019   Hypomagnesemia 03/24/2019   Spondylosis without myelopathy or radiculopathy, lumbosacral region 03/24/2019   Lumbar Facet Hypertrophy 03/24/2019   Grade 1 Anterolisthesis of L4/5 03/11/2019   Chronic hip pain (Bilateral) (R>L) 03/11/2019   Chronic sacroiliac joint pain (Bilateral) 03/11/2019   Sacroiliac joint somatic dysfunction (Bilateral) 03/11/2019   Chronic feet pain (Primary Area of Pain) (Bilateral) 03/11/2019   Chronic leg and foot pain (Bilateral) 03/11/2019   Diabetic peripheral neuropathy (HCC) 03/11/2019   Chronic neuropathic pain 03/11/2019   Neurogenic pain 03/11/2019   Chronic musculoskeletal pain 03/11/2019   Osteoarthritis involving multiple joints 03/11/2019   Chronic pain syndrome 03/10/2019   Pharmacologic therapy 03/10/2019   Disorder of skeletal system 03/10/2019   Problems influencing health status 03/10/2019   Chronic low back pain (Third area of Pain) (Bilateral) w/o sciatica 03/10/2019   Chronic lower extremity pain (Secondary area of Pain) (Bilateral) 03/10/2019   Abnormal MRI, lumbar spine (02/08/2019) 03/10/2019   Essential hypertension 10/06/2018   Cholelithiasis 06/23/2018   Fatty liver 06/23/2018   Helicobacter pylori  infection 05/13/2017   Mastalgia in female 04/09/2017   Bilateral leg edema 07/02/2016   DDD (degenerative disc disease), lumbar 12/16/2014   Lumbar facet syndrome (Bilateral) 12/16/2014    Sacroiliac joint dysfunction 12/16/2014   Neuropathy due to secondary diabetes (HCC) 12/16/2014   Vitamin D deficiency 12/13/2014   Morbid obesity (HCC) 12/10/2014   Type 2 diabetes mellitus with diabetic neuropathy, unspecified (HCC) 12/10/2014   Hyperlipidemia 12/10/2014   Gastroesophageal reflux disease without esophagitis 12/10/2014    Past Surgical History:  Procedure Laterality Date   ABDOMINAL HYSTERECTOMY     COLONOSCOPY WITH PROPOFOL N/A 03/30/2019   Procedure: COLONOSCOPY WITH PROPOFOL;  Surgeon: Toney ReilVanga, Rohini Reddy, MD;  Location: [facility] ENDOSCOPY;  Service: Gastroenterology;  Laterality: N/A;   ESOPHAGOGASTRODUODENOSCOPY (EGD) WITH PROPOFOL N/A 03/12/2017   Procedure: ESOPHAGOGASTRODUODENOSCOPY (EGD) WITH PROPOFOL;  Surgeon: Toney ReilVanga, Rohini Reddy, MD;  [facility];  Service: Gastroenterology;  Laterality: N/A;   ESOPHAGOGASTRODUODENOSCOPY (EGD) WITH PROPOFOL N/A 03/30/2019   Procedure: ESOPHAGOGASTRODUODENOSCOPY (EGD) WITH PROPOFOL;  Surgeon: Toney ReilVanga, Rohini Reddy, MD;  [facility];  Service: Gastroenterology;  Laterality: N/A;   HERNIA REPAIR     ORIF ANKLE FRACTURE Left 07/30/2019   Procedure: OPEN REDUCTION INTERNAL FIXATION (ORIF) OF LEFT BIMALLEOLAR ANKLE FRACTURE;  Surgeon: Signa KellPatel, Sunny, MD;  [facility];  Service: Orthopedics;  Laterality: Left;  Diabetic - oral meds   UMBILICAL HERNIA REPAIR N/A 05/01/2017   Procedure: HERNIA REPAIR UMBILICAL ADULT;  Surgeon: Ancil Linseyavis, Jason Kalicia Dufresne, MD;  Location: [facility] ORS;  Service: General;  Laterality: N/A;   XI ROBOTIC ASSISTED VENTRAL HERNIA N/A 10/06/2019   Procedure: XI ROBOTIC ASSISTED VENTRAL HERNIA;  Surgeon: Leafy RoPabon, Diego F, MD;  Location: [facility] ORS;  Service: General;  Laterality: N/A;    Prior to Admission medications   Medication Sig Start Date End Date Taking? Authorizing Provider  losartan (COZAAR) 25 MG tablet Take 1 tablet (25 mg total) by mouth daily. 10/10/20 10/10/21 Yes Jetaime Pinnix, Clent JacksEvan K, MD   potassium chloride (KLOR-CON) 10 MEQ tablet Take 1 tablet (10 mEq total) by mouth 2 (two) times daily for 7 days. 10/10/20 10/17/20 Yes Merwyn KatosBradler, Aquiles Ruffini K, MD  Accu-Chek Softclix Lancets lancets 2 (two) times daily. use for testing 07/06/20   [provider]  ALLERGY RELIEF 180 MG tablet TAKE 1 TABLET BY MOUTH EVERY DAY Patient taking differently: Take 180 mg by mouth daily. 03/22/20   Johnson, Megan P, DO  amLODipine (NORVASC) 5 MG tablet TAKE 1 TABLET(5 MG) BY MOUTH DAILY Patient taking differently: Take 5 mg by mouth daily. 09/01/20   Olevia PerchesJohnson, Megan P, DO  aspirin EC 81 MG tablet Take 81 mg by mouth daily. Swallow whole.    [provider]  atorvastatin (LIPITOR) 20 MG tablet TAKE 1 TABLET(20 MG) BY MOUTH DAILY Patient taking differently: Take 20 mg by mouth daily. 08/07/20   Johnson, Megan P, DO  carbidopa-levodopa (SINEMET IR) 25-100 MG tablet Take 1.5 tablets by mouth 3 (three) times daily. 08/16/20   [provider]  Cholecalciferol (VITAMIN D-3) 5000 units TABS Take 5,000 Units by mouth daily.    [provider]  DULoxetine (CYMBALTA) 60 MG capsule Take 1 capsule (60 mg total) by mouth daily. 09/01/20   Johnson, Megan P, DO  empagliflozin (JARDIANCE) 25 MG TABS tablet Take 1 tablet (25 mg total) by mouth daily. 09/01/20   Johnson, Megan P, DO  EPINEPHrine 0.3 mg/0.3 mL IJ SOAJ injection Inject 0.3 mg into the muscle as needed for anaphylaxis. 08/03/20  Johnson, Megan P, DO  gabapentin (NEURONTIN) 100 MG capsule Take 1 capsule (100 mg total) by mouth 3 (three) times daily. 09/01/20   Johnson, Megan P, DO  hydrochlorothiazide (HYDRODIURIL) 25 MG tablet Take 1 tablet (25 mg total) by mouth daily. 09/01/20   Johnson, Megan P, DO  ketoconazole (NIZORAL) 2 % cream APPLY TOPICALLY TO THE AFFECTED AREA DAILY Patient taking differently: Apply 1 application topically daily as needed for irritation. 01/15/20   Olevia Perches P, DO  Magnesium Oxide 500 MG CAPS Take 1 capsule  (500 mg total) by mouth in the morning. 02/18/20 09/30/20  Olevia Perches P, DO  metFORMIN (GLUCOPHAGE) 500 MG tablet Take 2 tablets (1,000 mg total) by mouth 2 (two) times daily with a meal. 09/13/20   McElwee, Lauren A, NP  montelukast (SINGULAIR) 10 MG tablet TAKE 1 TABLET(10 MG) BY MOUTH AT BEDTIME Patient taking differently: Take 10 mg by mouth at bedtime. 08/07/20   Johnson, Megan P, DO  nystatin (MYCOSTATIN) 100000 UNIT/ML suspension Take 5 mLs (500,000 Units total) by mouth 4 (four) times daily. Patient not taking: No sig reported 09/13/20   McElwee, Lauren A, NP  omeprazole (PRILOSEC) 40 MG capsule TAKE 1 CAPSULE(40 MG) BY MOUTH DAILY BEFORE BREAKFAST 10/03/20   McElwee, Lauren A, NP  sucralfate (CARAFATE) 1 g tablet Take 1 tablet (1 g total) by mouth 3 (three) times daily. 09/01/20 10/01/20  Olevia Perches P, DO  triamcinolone ointment (KENALOG) 0.5 % Apply 1 application topically 2 (two) times daily. Patient not taking: No sig reported 02/18/20   Olevia Perches P, DO  valACYclovir (VALTREX) 1000 MG tablet Take 1 tablet (1,000 mg total) by mouth 2 (two) times daily. Patient taking differently: Take 1,000 mg by mouth 2 (two) times daily as needed (shingles). 02/18/20   Johnson, Megan P, DO  vitamin B-12 (CYANOCOBALAMIN) 1000 MCG tablet Take 1,000 mcg by mouth 2 (two) times daily.    [provider]    Allergies Benazepril, Ivp dye [iodinated diagnostic agents], Lyrica [pregabalin], Shellfish allergy, Shellfish-derived products, Trulicity [dulaglutide], and Ace inhibitors  Family History  Problem Relation Age of Onset   Diabetes Mother    Cancer Mother        breast and stomach   Breast cancer Mother 59   Dementia Mother    Heart disease Father    Kidney disease Father        dialysis   Cancer Father        colon   Diabetes Father    Hypertension Sister    Hypertension Brother    Congestive Heart Failure Maternal Grandmother    Diabetes Maternal Grandmother     Hypertension Maternal Grandmother     Social History Social History   Tobacco Use   Smoking status: Never   Smokeless tobacco: Never  Vaping Use   Vaping Use: Never used  Substance Use Topics   Alcohol use: Yes    Alcohol/week: 3.0 standard drinks    Types: 3 Glasses of wine per week   Drug use: No    Review of Systems Constitutional: No fever/chills Eyes: No visual changes. ENT: No sore throat. Cardiovascular: Denies chest pain. Respiratory: Denies shortness of breath. Gastrointestinal: No abdominal pain.  No nausea, no vomiting.  No diarrhea. Genitourinary: Negative for dysuria. Musculoskeletal: Negative for acute arthralgias Skin: Negative for rash. Neurological: Negative for headaches, weakness/numbness/paresthesias in any extremity Psychiatric: Negative for suicidal ideation/homicidal ideation   ____________________________________________   PHYSICAL EXAM:  VITAL SIGNS: ED Triage Vitals  Enc Vitals Group     BP 10/10/20 1423 (!) 132/98     Pulse Rate 10/10/20 1423 (!) 105     Resp 10/10/20 1423 16     Temp 10/10/20 1423 98.4 F (36.9 C)     Temp Source 10/10/20 1423 Oral     SpO2 10/10/20 1423 99 %     Weight 10/10/20 1422 172 lb 13.5 oz (78.4 kg)     Height 10/10/20 1422  (1.6 m)     Head Circumference --      Peak Flow --      Pain Score 10/10/20 1422 0     Pain Loc --      Pain Edu? --      Excl. in GC? --    Constitutional: Alert and oriented. Well appearing obese elderly African-American female in no acute distress. Eyes: Conjunctivae are normal. PERRL. Head: Atraumatic. Nose: No congestion/rhinnorhea. Mouth/Throat: Mucous membranes are moist. Neck: No stridor Cardiovascular: Grossly normal heart sounds.  Good peripheral circulation. Respiratory: Normal respiratory effort.  No retractions. Gastrointestinal: Soft and nontender. No distention. Musculoskeletal: No obvious deformities Neurologic:  Normal speech and language. No gross focal  neurologic deficits are appreciated. Skin:  Skin is warm and dry. No rash noted. Psychiatric: Mood and affect are normal. Speech and behavior are normal.  ____________________________________________   LABS (all labs ordered are listed, but only abnormal results are displayed)  Labs Reviewed  BASIC METABOLIC PANEL - Abnormal; Notable for the following components:      Result Value   Sodium 134 (*)    Potassium 2.4 (*)    Chloride 91 (*)    Glucose, Bld 202 (*)    BUN <5 (*)    All other components within normal limits  CBC - Abnormal; Notable for the following components:   Hemoglobin 15.2 (*)    MCHC 36.1 (*)    All other components within normal limits  RESP PANEL BY RT-PCR (FLU A&B, COVID) ARPGX2  TROPONIN I (HIGH SENSITIVITY)  TROPONIN I (HIGH SENSITIVITY)   ____________________________________________  EKG  ED ECG REPORT I, Merwyn Katos, the attending physician, personally viewed and interpreted this ECG.  Date: 10/10/2020 EKG Time: 1432 Rate: 103 Rhythm: normal sinus rhythm QRS Axis: normal Intervals: normal ST/T Wave abnormalities: normal Narrative Interpretation: no evidence of acute ischemia  PROCEDURES  Procedure(s) performed (including Critical Care):  .1-3 Lead EKG Interpretation  Date/Time: 10/10/2020 7:00 PM Performed by: Merwyn Katos, MD Authorized by: Merwyn Katos, MD     Interpretation: normal     ECG rate:  92   ECG rate assessment: normal     Rhythm: sinus rhythm     Ectopy: none     Conduction: normal     ____________________________________________   INITIAL IMPRESSION / ASSESSMENT AND PLAN / ED COURSE  As part of my medical decision making, I reviewed the following data within the electronic MEDICAL RECORD NUMBER Nursing notes reviewed and incorporated, Labs reviewed, EKG interpreted, Old chart reviewed, Radiograph reviewed and Notes from prior ED visits reviewed and incorporated        Patient is a 60 year old  African-American female with the above-stated past medical history presents after being told by her primary care physician that she had a lower potassium level then the physician felt was safe.  Patient's EKG shows mild tachycardia in the 100s as well as a potassium level and her BMP of 2.4.  Patient was given 80 mEq orally as well  as a liter of NS with 20 of KCl.  Patient's tachycardia significantly improved and may have proved a slight aspect of dehydration.  Patient has no complaints at this time and patient's heart rate continued to improve throughout fluid administration.  Patient was given prescription for losartan instead of her currently prescribed hydrochlorothiazide as well as encouraged to take some potassium supplementation.  Patient states that she will be following up with her primary care doctor within the next week and was encouraged to recheck her potassium at that time.  Patient given strict return precautions and all questions were answered prior to discharge.  Patient agrees with plan for discharge home and follow-up with her primary care physician  Dispo: Discharge home with follow-up in her PCPs office every      ____________________________________________   FINAL CLINICAL IMPRESSION(S) / ED DIAGNOSES  Final diagnoses:  Hypokalemia     ED Discharge Orders          Ordered    losartan (COZAAR) 25 MG tablet  Daily        10/10/20 1837    potassium chloride (KLOR-CON) 10 MEQ tablet  2 times daily        10/10/20 1837             Note:  This document was prepared using Dragon voice recognition software and may include unintentional dictation errors.    Merwyn Katos, MD 10/10/20 1901

## 2020-10-10 NOTE — Progress Notes (Deleted)
ERROR

## 2020-10-11 ENCOUNTER — Telehealth: Payer: Self-pay | Admitting: General Practice

## 2020-10-11 ENCOUNTER — Ambulatory Visit: Payer: Self-pay | Admitting: General Practice

## 2020-10-11 ENCOUNTER — Telehealth: Payer: BLUE CROSS/BLUE SHIELD | Admitting: General Practice

## 2020-10-11 DIAGNOSIS — E785 Hyperlipidemia, unspecified: Secondary | ICD-10-CM

## 2020-10-11 DIAGNOSIS — I10 Essential (primary) hypertension: Secondary | ICD-10-CM

## 2020-10-11 DIAGNOSIS — E1140 Type 2 diabetes mellitus with diabetic neuropathy, unspecified: Secondary | ICD-10-CM

## 2020-10-11 NOTE — Telephone Encounter (Signed)
  Chronic Care Management   Note  10/11/2020 Name: Lisa Galvan MRN: [id number] DOB: 05-Jul-1960  The patient returned call. See new encounter.   Follow up plan: Telephone follow up appointment with care management team member scheduled for: 11-29-2020 at 0945 am  Alto Denver RN, MSN, CCM Community Care Coordinator [place]  [facility] Mobile: [phone]

## 2020-10-11 NOTE — Patient Instructions (Signed)
Visit Information  Patient Care Plan: General Social Work (Adult)     Problem Identified: I'm managing my physical and mental health better now   Priority: Medium     Long-Range Goal: Depressive Symptoms Identified   Start Date: 03/21/2020  This Visit's Progress: On track  Priority: Medium  Note:   Current Barriers:  Limited social support Level of care concerns ADL IADL limitations Social Isolation Limited access to caregiver  Clinical Social Work Clinical Goal(s):  Over the next 120 days, patient will work with SW to address concerns related to gaining additional support/resource connection in order to maintain health  Interventions: Patient interviewed and appropriate assessments performed Patient reports difficulty managing ongoing stressors. Patient's mother has dementia and is residing in [place], [place]. Patient also suffered from a recent allergic reaction to medications, in addition, to an upcoming surgery to correct a hernia. Patient feels that medications often make her feel "not like myself" which results in withdrawn behavior and at times, low motivation. Denies SI/HI Patient reports having a strong support system that includes her best friend and church family. Patient's daughter will arrive on Monday with patient's granddaughter to provide additional support CCM provided support and validation. Psychoeducation was provided on how physical and mental health correlates with one another. LCSW commended patient for having strong insight of body and advocating for self regarding her medical treatment. Strategies to promote self-care and stress management was identified. Patient was successful in identifying protective factors to increase motivation Advised patient to contact CCM LCSW if future social work needs arise. Discussed plans with patient for ongoing care management follow up and provided patient with direct contact information for care management team Inter-disciplinary  care team collaboration (see longitudinal plan of care)  Patient Self Care Activities:  Attend all scheduled appointments with providers Call provider office for new concerns or questions Utilize strategies discussed to assist in managing stress and promote self-care      Patient Care Plan: RNCM: Diabetes Type 2 (Adult)     Problem Identified: RNCM: Glycemic Management (Diabetes, Type 2)   Priority: Medium     Long-Range Goal: RNCM: Glycemic Management Optimized   Priority: Medium  Note:   Objective:  Lab Results  Component Value Date   HGBA1C 8.1 (H) 09/01/2020     Lab Results  Component Value Date   CREATININE 0.83 02/18/2020    No results found for: EGFR Current Barriers:  Knowledge Deficits related to basic Diabetes pathophysiology and self care/management Knowledge Deficits related to medications used for management of diabetes Film/video editor Limited Social Support Unable to independently manage DM Lacks social connections Does not maintain contact with provider office Does not contact provider office for questions/concerns Case Manager Clinical Goal(s):  Collaboration with Lisa Roys, DO regarding development and update of comprehensive plan of care as evidenced by provider attestation and co-signature Inter-disciplinary care team collaboration (see longitudinal plan of care) Over the next 120 days, patient will demonstrate improved adherence to prescribed treatment plan for diabetes self care/management as evidenced by:  daily monitoring and recording of CBG  adherence to ADA/ carb modified diet adherence to prescribed medication regimen Interventions:  Provided education to patient about basic DM disease process Reviewed medications with patient and discussed importance of medication adherence. 08-02-2020: Reviewed her change in [place] and the patient is tolerating well. Denies any issues at this time. 10-11-2020: The patient is compliant with  medications does have to hold her Metformin and other medications due to having surgery for  hernia repair next Tuesday the 28th. Discussed plans with patient for ongoing care management follow up and provided patient with direct contact information for care management team Provided patient with written educational materials related to hypo and hyperglycemia and importance of correct treatment.  10-11-2020: The patient denies any episodes of low blood sugars. Blood sugar this am is 156.  Hemoglobin A1C is elevated from previous hemoglobin A1C.   Reviewed scheduled/upcoming provider appointments including: 11-29-2020 at 0945 am Advised patient, providing education and rationale, to check cbg BID and record, calling pcp for findings outside established parameters.  10-11-2020: States blood sugars are better since medication changes. The patient verbalized this am her blood sugar was 156.  Referral made to pharmacy team for assistance with help with letter she received about "MIV" and needing documentation that she is not on insulin and other diagnosis information.  The patient to send e-mail with information to the [place] will forward to CCM team pharmacist. 06-07-2020: The patient is still working on paperwork. Has what she needs from the office. 08-02-2020: The patient has found and insurance company that she likes. She has what she needs currently  Collaboration with the pcp and CCM pharmacist concerning needing documentation that she needs a letter stating she does not have multiple heart conditions and she is not insulin dependent. Will send in basket message to the pcp and pharmacist for assistance. Did advise the patient that she could copy her last office visit note off of my Chart that had her diagnosis and medications listed. 06-07-2020: The patient is working with pharmacist. 08-02-2020: Ongoing support from pharm D. No new needs at this time.  Review of patient status, including review of  consultants reports, relevant laboratory and other test results, and medications completed. In basket message to Lisa Galvan and clinical staff at [facility] to make a referral to the breast center in [place] for annual mammogram. 08-02-2020: The patient states that she had her mammogram and she is doing well. She says the radiologist feels that it is fatty tissue and recommended that she take Vitamin E. She has not started the Vitamin E yet.  Patient Goals/Self-Care Activities Over the next 120 days, patient will:  - UNABLE to independently manage DM Attends all scheduled provider appointments Checks blood sugars as prescribed and utilize hyper and hypoglycemia protocol as needed Adheres to prescribed ADA/carb modified. 10-11-2020: The patient is working on dietary changes.  The RNCM sending planning healthy meals document via mail to the patient.  - barriers to adherence to treatment plan identified - blood glucose monitoring encouraged - blood glucose readings reviewed.  10-11-2020: This am blood sugar is 156 - individualized medical nutrition therapy provided - mutual A1C goal set or reviewed- 10-11-2020: review of goal of hemoglobin A1C of <7.0 - resources required to improve adherence to care identified - self-awareness of signs/symptoms of hypo or hyperglycemia encouraged.  10-11-2020: The patient states that she is aware of the sx and sx of hypo/hyperglycemia.  - use of blood glucose monitoring log promoted Follow Up Plan: Telephone follow up appointment with care management team member scheduled for: 11-29-2020 of 0945 am    Task: RNCM: Alleviate Barriers to Glycemic Management   Note:   Care Management Activities:    - barriers to adherence to treatment plan identified - blood glucose monitoring encouraged - blood glucose readings reviewed - individualized medical nutrition therapy provided - mutual A1C goal set or reviewed - resources required to improve adherence to care  identified -  self-awareness of signs/symptoms of hypo or hyperglycemia encouraged - use of blood glucose monitoring log promoted        Patient Care Plan: RNCM: Hypertension (Adult)     Problem Identified: RNCM: Hypertension (Hypertension)   Priority: Medium     Long-Range Goal: RNCM: Hypertension Monitored   Priority: Medium  Note:   Objective:  Last practice recorded BP readings:  BP Readings from Last 3 Encounters:  10/10/20 (!) 145/88  09/26/20 104/74  09/13/20 113/83      Most recent eGFR/CrCl: No results found for: EGFR  No components found for: CRCL Current Barriers:  Knowledge Deficits related to basic understanding of hypertension pathophysiology and self care management Knowledge Deficits related to understanding of medications prescribed for management of hypertension Limited Social Designer, multimedia.  Unable to independently manage HTN Lacks social connections Does not maintain contact with provider office Does not contact provider office for questions/concerns Case Manager Clinical Goal(s):  Over the next 120 days, patient will verbalize understanding of plan for hypertension management Over the next 120 days, patient will attend all scheduled medical appointments: 05-23-2020 at 2:20 pm Over the next 120 days, patient will demonstrate improved adherence to prescribed treatment plan for hypertension as evidenced by taking all medications as prescribed, monitoring and recording blood pressure as directed, adhering to low sodium/DASH diet Over the next 120 days, patient will demonstrate improved health management independence as evidenced by checking blood pressure as directed and notifying PCP if SBP>160 or DBP > 90, taking all medications as prescribe, and adhering to a low sodium diet as discussed. Over the next 120 days, patient will verbalize basic understanding of hypertension disease process and self health management plan as evidenced by compliance with the plan  of care, taking medications as ordered, and working with the CCM team to manage health and well being Interventions:  Collaboration with Lisa Roys, DO regarding development and update of comprehensive plan of care as evidenced by provider attestation and co-signature Inter-disciplinary care team collaboration (see longitudinal plan of care) UNABLE to independently:manage HTN Evaluation of current treatment plan related to hypertension self management and patient's adherence to plan as established by provider. 10-11-2020: The patient states her blood pressure is doing good. Denies any lows. The patient states that she takes it sometimes at home.  Provided education to patient re: stroke prevention, s/s of heart attack and stroke, DASH diet, complications of uncontrolled blood pressure. 06-07-2020: Review of the benefits of a heart healthy/ADA diet. Information sent to the patient via mail. 08-02-2020: The patient received information is looking forward to the fresh fruits and vegetables from the garden. Does not have an appetite and this has been over the last month. She has lost weight but has not weighed lately. Denies issues with dehydration. 10-11-2020: The patient states that she had diarrhea and vomiting over a week ago. She had labwork for upcoming surgery next week and her potassium level was 2.4. She was told to go to the ER. She went to the ER and had potassium replacement and also a script for potassium. She is eating and doing better.  Reviewed medications with patient and discussed importance of compliance. 08-02-2020: The patient is compliant with medications. Is coming by the office today to pick up a pill box to use at home. Will also provide information on [employer]vi-Q.  The patient stated that she could not afford 165.00 for an EPI pen. [employer] is a program that allows patients to get EPI  pen for 25.00. 10-11-2020: the patient is compliant with medications regimen. States she knows which  medications to hold for her upcoming surgery.  Discussed plans with patient for ongoing care management follow up and provided patient with direct contact information for care management team Advised patient, providing education and rationale, to monitor blood pressure daily and record, calling PCP for findings outside established parameters.  Reviewed scheduled/upcoming provider appointments including: 12-02-2020 Patient Goals/Self-Care Activities Over the next 120 days, patient will:  - UNABLE to independently manage HTN Calls provider office for new concerns, questions, or BP outside discussed parameters Checks BP and records as discussed Follows a low sodium diet/DASH diet - blood pressure trends reviewed - depression screen reviewed - home or ambulatory blood pressure monitoring encouraged Follow Up Plan: Telephone follow up appointment with care management team member scheduled for: 11-29-2020 at 0945 am    Task: RNCM: Identify and Monitor Blood Pressure Elevation   Note:   Care Management Activities:    - blood pressure trends reviewed - depression screen reviewed - home or ambulatory blood pressure monitoring encouraged        Patient Care Plan: RNCM: Management of HLD     Problem Identified: RNCM: Management of HLD   Priority: Medium     Goal: RNCM: Managment of HLD   Priority: Medium  Note:   Current Barriers:  Poorly controlled hyperlipidemia, complicated by HTN, DM Current antihyperlipidemic regimen: Lipitor 20 mg QD Most recent lipid panel:  Lab Results  Component Value Date   CHOL 207 (H) 02/18/2020   HDL 122 02/18/2020   [place] 69 02/18/2020   TRIG 97 02/18/2020   CHOLHDL 1.4 01/03/2017    ASCVD risk enhancing conditions: age 34 DM, HTN Unable to independently manage HLD Lacks social connections Does not maintain contact with provider office Does not contact provider office for questions/concerns  RN Care Manager Clinical Goal(s):  Over the next  120 days, patient will work with Consulting civil engineer, providers, and care team towards execution of optimized self-health management plan Over the next 120 days, patient will verbalize understanding of plan for working with the CCM team to meet health and wellness needs Over the next 120 days, patient will work with [facility], pharmacist and pcp to address needs related to expressed needs for paperwork with documentation about chronic conditions and medicaitons  Over the next 120 days, patient will attend all scheduled medical appointments: 12-02-2020 Over the next 30 days, patient will work with CM team pharmacist to get needed documentation expressed in the call today.   Interventions: Collaboration with Lisa Roys, DO regarding development and update of comprehensive plan of care as evidenced by provider attestation and co-signature Inter-disciplinary care team collaboration (see longitudinal plan of care) Medication review performed; medication list updated in electronic medical record.  Inter-disciplinary care team collaboration (see longitudinal plan of care) Referred to pharmacy team for assistance with HLD medication management. 06-07-2020: The patient currently working with the pharm D. 08-02-2020: The patient continue to work with the pharm D. 10-11-2020: The patient is compliant with medications. Denies any new concerns at this time with medications management. Evaluation of current treatment plan related to HLD and patient's adherence to plan as established by provider. 08-02-2020: The patient is compliant with the plan of care. The patient is doing well.  Advised patient to send letter to the [facility] email address for review and forwarding to the pharmacist Provided education to patient re: printing off last office visit note from 02-17-2021  with pcp as supporting documentation to the expressed needs during the call.  06-07-2020: The patient is still working on getting new insurance. She will let RNCM  know if she needs additional assistance. Did ask for supply company that could assist with script she has for a walker with seat and breaks. Provided numbers to medical supply companies in [place] for the patient to call. 08-02-2020: The patient has new insurance.  The patients needs are being met.  Reviewed medications with patient and discussed compliance. 10-11-2020:  The patient is compliant with her medications denies any needs at this time.  Collaborated with pcp and pharmacist  regarding needs expressed for documentation needed for insurance purposes. 10-11-2020- this has been completed.  Discussed plans with patient for ongoing care management follow up and provided patient with direct contact information for care management team Reviewed scheduled/upcoming provider appointments including: 12-02-2020 Pharmacy referral for assistance with expressed paperwork for insurance purposes. 08-02-2020 completed Review of SDOH- the patient denies any needs. May be interested in food resources when she has her hernia repair surgery. Patient has information and will let RNCM know if changes are needed.  10-11-2020: the patient is interested in food resources in the county for help with food. Will place a care guide referral for assistance.    Patient Goals/Self-Care Activities: Over the next 120 days, patient will:   - call for medicine refill 2 or 3 days before it runs out - call if I am sick and can't take my medicine - keep a list of all the medicines I take; vitamins and herbals too - learn to read medicine labels - use a pillbox to sort medicine - use an alarm clock or phone to remind me to take my medicine - change to whole grain breads, cereal, pasta - drink 6 to 8 glasses of water each day - eat 3 to 5 servings of fruits and vegetables each day - fill half the plate with nonstarchy vegetables - limit fast food meals to no more than 1 per week - manage portion size - prepare main meal at  home 3 to 5 days each week - read food labels for fat, fiber, carbohydrates and portion size - be open to making changes - I can manage, know and watch for signs of a heart attack - if I have chest pain, call for help - learn about small changes that will make a big difference - learn my personal risk factors  - barriers to medication adherence identified - medication list compiled - medication list reviewed - medication-adherence assessment completed - self-management plan initiated or updated - understanding of current medications assessed   Follow Up Plan: Telephone follow up appointment with care management team member scheduled for: 11-29-2020 at 0945 am      Task: RNCM: management of HLD   Note:   Care Management Activities:    - barriers to medication adherence identified - medication list compiled - medication list reviewed - medication-adherence assessment completed - self-management plan initiated or updated - understanding of current medications assessed        Patient Care Plan: General Social Work (Adult)     Problem Identified: Quality of Life (General Plan of Care)      Long-Range Goal: Quality of Life Maintained   Start Date: 06/20/2020  Priority: Medium  Note:   Timeframe:  Long-Range Goal Priority:  Medium Start Date:   06/20/20  Expected End Date:  09/17/20                     Follow Up Date- 08/11/20   - arrange a ride through an agency 1 week before appointment - ask family or friend for a ride - call to cancel if needed - keep a calendar with prescription refill dates - keep a calendar with appointment dates - learn the bus route - use public transportation    Why is this important?   Part of staying healthy is seeing the doctor for follow-up care.  If you forget your appointments, there are some things you can do to stay on track.    CARE PLAN ENTRY (see longitudinal plan of care for additional care plan  information)  Current Barriers:  Patient In need of assistance with connection to community resources in order to attend needed appointments  Knowledge deficits and need for support, education and care coordination related to community resources support  ADL IADL limitations and Social Isolation  Clinical Goal(s)  Over the next 120 days patient will be contact [facility] as advised by LCSW Over the next 120 days, patient will work with care management team member to address concerns related to gaining mammogram appointment  Over the next 120 days, patient will work with LCSW to address needs related to lack of self care Over the next 120 days, patient will implement appropriate coping skills to improve stress management   Interventions provided by LCSW:  Assessed patient's care coordination needs related to needing a mammogram appointment and discussed ongoing care management follow up  Patient reports that PCP put in order for a mammogram but when she used the link on MyChart and put in her zip code it was asking her to chose locations in [place] or [place] instead of [place]. Patient reports that this happened last year. Patient asked CCM LCSW to notify PCP regarding this concern. CCM LCSW informed patient that PCP put in the order her preferred location as [facility]. CCM LCSW provided patient with information about [facility]. Patient is agreeable to contact them today.  Patient reports that she wishes for CCM LCSW to update PCP on her lyrica medication as well. She reports that this medication is making her extremely tired and she is wondering if she should discontinue medication and get back on a lower dose of gabapentin.  Patient is actively involved with [facility].  Advised patient to implement appropriate self-care and stress management coping skills into her daily routine* Collaborated with  appropriate clinical care team members regarding patient needs Patient interviewed and appropriate assessments performed Discussed plans with patient for ongoing care management follow up and provided patient with direct contact information for care management team Assisted patient/caregiver with obtaining information about health plan benefits Provided education and assistance to client regarding Advanced Directives. Mindfulness or Psychologist, educational, Psychoeducation and/or Health Education, and Problem Solving  Patient Self Care Activities & Deficits:  Patient is unable to independently navigate community resource options without care coordination support  Acknowledges deficits and is motivated to resolve concern  Patient is able to contact [place]  as discussed today [employer] aid/development worker provider office for new concerns or questions, Ability for insight, Independent living, and Motivation for treatment  Initial goal documentation    Task: Support and Maintain Acceptable Degree of Health, Comfort and Happiness   Note:   Care Management  Activities:    - affirmation provided - community involvement promoted - expression of thoughts about present/future encouraged - independence in all possible areas promoted - life review by storytelling encouraged - patient strengths promoted - psychosocial concerns monitored - self-expression encouraged - sleep diary encouraged - sleep hygiene techniques encouraged - social relationships promoted - strategies to maintain hearing and/or vision promoted - strategies to maintain intimacy promoted - wellness behaviors promoted    Notes:        Patient verbalizes understanding of instructions provided today and agrees to view in [place].   Telephone follow up appointment with care management team member scheduled for: 11-29-2020 at [place] am  [name] RN, MSN, [facility] Mobile: [phone]

## 2020-10-11 NOTE — Chronic Care Management (AMB) (Signed)
Care Management    RN Visit Note  10/11/2020 Name: Lisa Galvan MRN: [id number] DOB: 1960/11/23  Subjective: Lisa Galvan is a 60 y.o. year old female who is a primary care patient of Lisa Roys, DO. The care management team was consulted for assistance with disease management and care coordination needs.    Engaged with patient by telephone for follow up visit in response to provider referral for case management and/or care coordination services.   Consent to Services:   Ms. Bordeaux was given information about Care Management services today including:  Care Management services includes personalized support from designated clinical staff supervised by her physician, including individualized plan of care and coordination with other care providers 24/7 contact phone numbers for assistance for urgent and routine care needs. The patient may stop case management services at any time by phone call to the office staff.  Patient agreed to services and consent obtained.   Assessment: Review of patient past medical history, allergies, medications, health status, including review of consultants reports, laboratory and other test data, was performed as part of comprehensive evaluation and provision of chronic care management services.   SDOH (Social Determinants of Health) assessments and interventions performed:    Care Plan  Allergies  Allergen Reactions   Benazepril Swelling    Face and mouth swelling.   Ivp Dye [Iodinated Diagnostic Agents] Anaphylaxis    Patient unsure of reaction but in case of reaction d/t shellfish allergy   Lyrica [Pregabalin] Swelling    Face/ mouth swelled up balloon   Shellfish Allergy Anaphylaxis and Swelling    Betadine on skin is okay   Shellfish-Derived Products Anaphylaxis   Trulicity [Dulaglutide] Swelling    Face and mouth swelled up   Ace Inhibitors Swelling    Outpatient Encounter Medications as of 10/11/2020  Medication Sig   Accu-Chek Softclix  Lancets lancets 2 (two) times daily. use for testing   ALLERGY RELIEF 180 MG tablet TAKE 1 TABLET BY MOUTH EVERY DAY (Patient taking differently: Take 180 mg by mouth daily.)   amLODipine (NORVASC) 5 MG tablet TAKE 1 TABLET(5 MG) BY MOUTH DAILY (Patient taking differently: Take 5 mg by mouth daily.)   aspirin EC 81 MG tablet Take 81 mg by mouth daily. Swallow whole.   atorvastatin (LIPITOR) 20 MG tablet TAKE 1 TABLET(20 MG) BY MOUTH DAILY (Patient taking differently: Take 20 mg by mouth daily.)   carbidopa-levodopa (SINEMET IR) 25-100 MG tablet Take 1.5 tablets by mouth 3 (three) times daily.   Cholecalciferol (VITAMIN D-3) 5000 units TABS Take 5,000 Units by mouth daily.   DULoxetine (CYMBALTA) 60 MG capsule Take 1 capsule (60 mg total) by mouth daily.   empagliflozin (JARDIANCE) 25 MG TABS tablet Take 1 tablet (25 mg total) by mouth daily.   EPINEPHrine 0.3 mg/0.3 mL IJ SOAJ injection Inject 0.3 mg into the muscle as needed for anaphylaxis.   gabapentin (NEURONTIN) 100 MG capsule Take 1 capsule (100 mg total) by mouth 3 (three) times daily.   hydrochlorothiazide (HYDRODIURIL) 25 MG tablet Take 1 tablet (25 mg total) by mouth daily.   ketoconazole (NIZORAL) 2 % cream APPLY TOPICALLY TO THE AFFECTED AREA DAILY (Patient taking differently: Apply 1 application topically daily as needed for irritation.)   losartan (COZAAR) 25 MG tablet Take 1 tablet (25 mg total) by mouth daily.   Magnesium Oxide 500 MG CAPS Take 1 capsule (500 mg total) by mouth in the morning.   metFORMIN (GLUCOPHAGE) 500 MG tablet Take 2  tablets (1,000 mg total) by mouth 2 (two) times daily with a meal.   montelukast (SINGULAIR) 10 MG tablet TAKE 1 TABLET(10 MG) BY MOUTH AT BEDTIME (Patient taking differently: Take 10 mg by mouth at bedtime.)   nystatin (MYCOSTATIN) 100000 UNIT/ML suspension Take 5 mLs (500,000 Units total) by mouth 4 (four) times daily. (Patient not taking: No sig reported)   omeprazole (PRILOSEC) 40 MG capsule  TAKE 1 CAPSULE(40 MG) BY MOUTH DAILY BEFORE BREAKFAST   potassium chloride (KLOR-CON) 10 MEQ tablet Take 1 tablet (10 mEq total) by mouth 2 (two) times daily for 7 days.   sucralfate (CARAFATE) 1 g tablet Take 1 tablet (1 g total) by mouth 3 (three) times daily.   triamcinolone ointment (KENALOG) 0.5 % Apply 1 application topically 2 (two) times daily. (Patient not taking: No sig reported)   valACYclovir (VALTREX) 1000 MG tablet Take 1 tablet (1,000 mg total) by mouth 2 (two) times daily. (Patient taking differently: Take 1,000 mg by mouth 2 (two) times daily as needed (shingles).)   vitamin B-12 (CYANOCOBALAMIN) 1000 MCG tablet Take 1,000 mcg by mouth 2 (two) times daily.   No facility-administered encounter medications on file as of 10/11/2020.    Patient Active Problem List   Diagnosis Date Noted   Aortic atherosclerosis ([place]) 07/12/2020   OSA (obstructive sleep apnea) 05/23/2020   Coccygodynia 02/01/2020   Facial swelling 07/21/2019   Acute postoperative pain 06/30/2019   History of allergy to radiographic contrast media 06/02/2019   History of allergy to shellfish 06/02/2019   History of Allergy to iodine 06/02/2019    Class: History of   History of anaphylaxis 06/02/2019   Other spondylosis, sacral and sacrococcygeal region 04/21/2019   Abnormal MRI, cervical spine (02/08/2019) 03/25/2019   Lower extremity weakness (Bilateral) 03/25/2019   Coordination impairment (lower extremity) 03/25/2019   Hypomagnesemia 03/24/2019   Spondylosis without myelopathy or radiculopathy, lumbosacral region 03/24/2019   Lumbar Facet Hypertrophy 03/24/2019   Grade 1 Anterolisthesis of L4/5 03/11/2019   Chronic hip pain (Bilateral) (R>L) 03/11/2019   Chronic sacroiliac joint pain (Bilateral) 03/11/2019   Sacroiliac joint somatic dysfunction (Bilateral) 03/11/2019   Chronic feet pain (Primary Area of Pain) (Bilateral) 03/11/2019   Chronic leg and foot pain (Bilateral) 03/11/2019   Diabetic  peripheral neuropathy ([place]) 03/11/2019   Chronic neuropathic pain 03/11/2019   Neurogenic pain 03/11/2019   Chronic musculoskeletal pain 03/11/2019   Osteoarthritis involving multiple joints 03/11/2019   Chronic pain syndrome 03/10/2019   Pharmacologic therapy 03/10/2019   Disorder of skeletal system 03/10/2019   Problems influencing health status 03/10/2019   Chronic low back pain (Third area of Pain) (Bilateral) w/o sciatica 03/10/2019   Chronic lower extremity pain (Secondary area of Pain) (Bilateral) 03/10/2019   Abnormal MRI, lumbar spine (02/08/2019) 03/10/2019   Essential hypertension 10/06/2018   Cholelithiasis 06/23/2018   Fatty liver 16/01/9603   Helicobacter pylori infection 05/13/2017   Mastalgia in female 04/09/2017   Bilateral leg edema 07/02/2016   DDD (degenerative disc disease), lumbar 12/16/2014   Lumbar facet syndrome (Bilateral) 12/16/2014   Sacroiliac joint dysfunction 12/16/2014   Neuropathy due to secondary diabetes ([place]) 12/16/2014   Vitamin D deficiency 12/13/2014   Morbid obesity ([place]) 12/10/2014   Type 2 diabetes mellitus with diabetic neuropathy, unspecified ([place]) 12/10/2014   Hyperlipidemia 12/10/2014   Gastroesophageal reflux disease without esophagitis 12/10/2014    Conditions to be addressed/monitored: HTN, HLD, and DMII  Care Plan : RNCM: Diabetes Type 2 (Adult)  Updates made by Hall Busing,  Nobie Putnam since 10/11/2020 12:00 AM     Problem: RNCM: Glycemic Management (Diabetes, Type 2)   Priority: Medium     Long-Range Goal: RNCM: Glycemic Management Optimized   Priority: Medium  Note:   Objective:  Lab Results  Component Value Date   HGBA1C 8.1 (H) 09/01/2020     Lab Results  Component Value Date   CREATININE 0.83 02/18/2020    No results found for: EGFR Current Barriers:  Knowledge Deficits related to basic Diabetes pathophysiology and self care/management Knowledge Deficits related to medications used for management of diabetes Museum/gallery exhibitions officer Limited Social Support Unable to independently manage DM Lacks social connections Does not maintain contact with provider office Does not contact provider office for questions/concerns Case Manager Clinical Goal(s):  Collaboration with Lisa Roys, DO regarding development and update of comprehensive plan of care as evidenced by provider attestation and co-signature Inter-disciplinary care team collaboration (see longitudinal plan of care) Over the next 120 days, patient will demonstrate improved adherence to prescribed treatment plan for diabetes self care/management as evidenced by:  daily monitoring and recording of CBG  adherence to ADA/ carb modified diet adherence to prescribed medication regimen Interventions:  Provided education to patient about basic DM disease process Reviewed medications with patient and discussed importance of medication adherence. 08-02-2020: Reviewed her change in Jardiance and the patient is tolerating well. Denies any issues at this time. 10-11-2020: The patient is compliant with medications does have to hold her Metformin and other medications due to having surgery for hernia repair next Tuesday the 28th. Discussed plans with patient for ongoing care management follow up and provided patient with direct contact information for care management team Provided patient with written educational materials related to hypo and hyperglycemia and importance of correct treatment.  10-11-2020: The patient denies any episodes of low blood sugars. Blood sugar this am is 156.  Hemoglobin A1C is elevated from previous hemoglobin A1C.   Reviewed scheduled/upcoming provider appointments including: 11-29-2020 at 0945 am Advised patient, providing education and rationale, to check cbg BID and record, calling pcp for findings outside established parameters.  10-11-2020: States blood sugars are better since medication changes. The patient verbalized this am her blood sugar  was 156.  Referral made to pharmacy team for assistance with help with letter she received about "MIV" and needing documentation that she is not on insulin and other diagnosis information.  The patient to send e-mail with information to the [place] will forward to CCM team pharmacist. 06-07-2020: The patient is still working on paperwork. Has what she needs from the office. 08-02-2020: The patient has found and insurance company that she likes. She has what she needs currently  Collaboration with the pcp and CCM pharmacist concerning needing documentation that she needs a letter stating she does not have multiple heart conditions and she is not insulin dependent. Will send in basket message to the pcp and pharmacist for assistance. Did advise the patient that she could copy her last office visit note off of my Chart that had her diagnosis and medications listed. 06-07-2020: The patient is working with pharmacist. 08-02-2020: Ongoing support from pharm D. No new needs at this time.  Review of patient status, including review of consultants reports, relevant laboratory and other test results, and medications completed. In basket message to Dr. Wynetta Emery and clinical staff at [facility] to make a referral to the breast center in [place] for annual mammogram. 08-02-2020: The patient states that she had her  mammogram and she is doing well. She says the radiologist feels that it is fatty tissue and recommended that she take Vitamin E. She has not started the Vitamin E yet.  Patient Goals/Self-Care Activities Over the next 120 days, patient will:  - UNABLE to independently manage DM Attends all scheduled provider appointments Checks blood sugars as prescribed and utilize hyper and hypoglycemia protocol as needed Adheres to prescribed ADA/carb modified. 10-11-2020: The patient is working on dietary changes.  The RNCM sending planning healthy meals document via mail to the patient.  - barriers to adherence to  treatment plan identified - blood glucose monitoring encouraged - blood glucose readings reviewed.  10-11-2020: This am blood sugar is 156 - individualized medical nutrition therapy provided - mutual A1C goal set or reviewed- 10-11-2020: review of goal of hemoglobin A1C of <7.0 - resources required to improve adherence to care identified - self-awareness of signs/symptoms of hypo or hyperglycemia encouraged.  10-11-2020: The patient states that she is aware of the sx and sx of hypo/hyperglycemia.  - use of blood glucose monitoring log promoted Follow Up Plan: Telephone follow up appointment with care management team member scheduled for: 11-29-2020 of 0945 am    Care Plan : RNCM: Hypertension (Adult)  Updates made by Vanita Ingles since 10/11/2020 12:00 AM     Problem: RNCM: Hypertension (Hypertension)   Priority: Medium     Long-Range Goal: RNCM: Hypertension Monitored   Priority: Medium  Note:   Objective:  Last practice recorded BP readings:  BP Readings from Last 3 Encounters:  10/10/20 (!) 145/88  09/26/20 104/74  09/13/20 113/83      Most recent eGFR/CrCl: No results found for: EGFR  No components found for: CRCL Current Barriers:  Knowledge Deficits related to basic understanding of hypertension pathophysiology and self care management Knowledge Deficits related to understanding of medications prescribed for management of hypertension Limited Social Designer, multimedia.  Unable to independently manage HTN Lacks social connections Does not maintain contact with provider office Does not contact provider office for questions/concerns Case Manager Clinical Goal(s):  Over the next 120 days, patient will verbalize understanding of plan for hypertension management Over the next 120 days, patient will attend all scheduled medical appointments: 05-23-2020 at 2:20 pm Over the next 120 days, patient will demonstrate improved adherence to prescribed treatment plan for  hypertension as evidenced by taking all medications as prescribed, monitoring and recording blood pressure as directed, adhering to low sodium/DASH diet Over the next 120 days, patient will demonstrate improved health management independence as evidenced by checking blood pressure as directed and notifying PCP if SBP>160 or DBP > 90, taking all medications as prescribe, and adhering to a low sodium diet as discussed. Over the next 120 days, patient will verbalize basic understanding of hypertension disease process and self health management plan as evidenced by compliance with the plan of care, taking medications as ordered, and working with the CCM team to manage health and well being Interventions:  Collaboration with Lisa Roys, DO regarding development and update of comprehensive plan of care as evidenced by provider attestation and co-signature Inter-disciplinary care team collaboration (see longitudinal plan of care) UNABLE to independently:manage HTN Evaluation of current treatment plan related to hypertension self management and patient's adherence to plan as established by provider. 10-11-2020: The patient states her blood pressure is doing good. Denies any lows. The patient states that she takes it sometimes at home.  Provided education to patient re: stroke prevention, s/s of  heart attack and stroke, DASH diet, complications of uncontrolled blood pressure. 06-07-2020: Review of the benefits of a heart healthy/ADA diet. Information sent to the patient via mail. 08-02-2020: The patient received information is looking forward to the fresh fruits and vegetables from the garden. Does not have an appetite and this has been over the last month. She has lost weight but has not weighed lately. Denies issues with dehydration. 10-11-2020: The patient states that she had diarrhea and vomiting over a week ago. She had labwork for upcoming surgery next week and her potassium level was 2.4. She was told to go  to the ER. She went to the ER and had potassium replacement and also a script for potassium. She is eating and doing better.  Reviewed medications with patient and discussed importance of compliance. 08-02-2020: The patient is compliant with medications. Is coming by the office today to pick up a pill box to use at home. Will also provide information on Auvi-Q.  The patient stated that she could not afford 165.00 for an EPI pen. Auvi-Q is a program that allows patients to get EPI pen for 25.00. 10-11-2020: the patient is compliant with medications regimen. States she knows which medications to hold for her upcoming surgery.  Discussed plans with patient for ongoing care management follow up and provided patient with direct contact information for care management team Advised patient, providing education and rationale, to monitor blood pressure daily and record, calling PCP for findings outside established parameters.  Reviewed scheduled/upcoming provider appointments including: 12-02-2020 Patient Goals/Self-Care Activities Over the next 120 days, patient will:  - UNABLE to independently manage HTN Calls provider office for new concerns, questions, or BP outside discussed parameters Checks BP and records as discussed Follows a low sodium diet/DASH diet - blood pressure trends reviewed - depression screen reviewed - home or ambulatory blood pressure monitoring encouraged Follow Up Plan: Telephone follow up appointment with care management team member scheduled for: 11-29-2020 at 0945 am    Care Plan : RNCM: Management of HLD  Updates made by Vanita Ingles since 10/11/2020 12:00 AM     Problem: RNCM: Management of HLD   Priority: Medium     Goal: RNCM: Managment of HLD   Priority: Medium  Note:   Current Barriers:  Poorly controlled hyperlipidemia, complicated by HTN, DM Current antihyperlipidemic regimen: Lipitor 20 mg QD Most recent lipid panel:  Lab Results  Component Value Date   CHOL  207 (H) 02/18/2020   HDL 122 02/18/2020   [place] 69 02/18/2020   TRIG 97 02/18/2020   CHOLHDL 1.4 01/03/2017    ASCVD risk enhancing conditions: age 10 DM, HTN Unable to independently manage HLD Lacks social connections Does not maintain contact with provider office Does not contact provider office for questions/concerns  RN Care Manager Clinical Goal(s):  Over the next 120 days, patient will work with Consulting civil engineer, providers, and care team towards execution of optimized self-health management plan Over the next 120 days, patient will verbalize understanding of plan for working with the CCM team to meet health and wellness needs Over the next 120 days, patient will work with [facility], pharmacist and pcp to address needs related to expressed needs for paperwork with documentation about chronic conditions and medicaitons  Over the next 120 days, patient will attend all scheduled medical appointments: 12-02-2020 Over the next 30 days, patient will work with CM team pharmacist to get needed documentation expressed in the call today.   Interventions: Collaboration with  Lisa Roys, DO regarding development and update of comprehensive plan of care as evidenced by provider attestation and co-signature Inter-disciplinary care team collaboration (see longitudinal plan of care) Medication review performed; medication list updated in electronic medical record.  Inter-disciplinary care team collaboration (see longitudinal plan of care) Referred to pharmacy team for assistance with HLD medication management. 06-07-2020: The patient currently working with the pharm D. 08-02-2020: The patient continue to work with the pharm D. 10-11-2020: The patient is compliant with medications. Denies any new concerns at this time with medications management. Evaluation of current treatment plan related to HLD and patient's adherence to plan as established by provider. 08-02-2020: The patient is compliant with the plan  of care. The patient is doing well.  Advised patient to send letter to the [facility] email address for review and forwarding to the pharmacist Provided education to patient re: printing off last office visit note from 02-17-2021 with pcp as supporting documentation to the expressed needs during the call.  06-07-2020: The patient is still working on getting new insurance. She will let RNCM know if she needs additional assistance. Did ask for supply company that could assist with script she has for a walker with seat and breaks. Provided numbers to medical supply companies in [place] for the patient to call. 08-02-2020: The patient has new insurance.  The patients needs are being met.  Reviewed medications with patient and discussed compliance. 10-11-2020:  The patient is compliant with her medications denies any needs at this time.  Collaborated with pcp and pharmacist  regarding needs expressed for documentation needed for insurance purposes. 10-11-2020- this has been completed.  Discussed plans with patient for ongoing care management follow up and provided patient with direct contact information for care management team Reviewed scheduled/upcoming provider appointments including: 12-02-2020 Pharmacy referral for assistance with expressed paperwork for insurance purposes. 08-02-2020 completed Review of SDOH- the patient denies any needs. May be interested in food resources when she has her hernia repair surgery. Patient has information and will let RNCM know if changes are needed.  10-11-2020: the patient is interested in food resources in the county for help with food. Will place a care guide referral for assistance.    Patient Goals/Self-Care Activities: Over the next 120 days, patient will:   - call for medicine refill 2 or 3 days before it runs out - call if I am sick and can't take my medicine - keep a list of all the medicines I take; vitamins and herbals too - learn to read medicine labels - use  a pillbox to sort medicine - use an alarm clock or phone to remind me to take my medicine - change to whole grain breads, cereal, pasta - drink 6 to 8 glasses of water each day - eat 3 to 5 servings of fruits and vegetables each day - fill half the plate with nonstarchy vegetables - limit fast food meals to no more than 1 per week - manage portion size - prepare main meal at home 3 to 5 days each week - read food labels for fat, fiber, carbohydrates and portion size - be open to making changes - I can manage, know and watch for signs of a heart attack - if I have chest pain, call for help - learn about small changes that will make a big difference - learn my personal risk factors  - barriers to medication adherence identified - medication list compiled - medication list reviewed - medication-adherence assessment  completed - self-management plan initiated or updated - understanding of current medications assessed   Follow Up Plan: Telephone follow up appointment with care management team member scheduled for: 11-29-2020 at 0945 am       Plan: Telephone follow up appointment with care management team member scheduled for:  11-29-2020 at [place] am  [place], MSN, [facility] Mobile: [phone]

## 2020-10-12 ENCOUNTER — Telehealth: Payer: Self-pay | Admitting: Family Medicine

## 2020-10-12 NOTE — Telephone Encounter (Signed)
   Telephone encounter was:  Successful.  10/12/2020 Name: Lisa Galvan MRN: [id number] DOB: 04-17-1961  Lisa Galvan is a 60 y.o. year old female who is a primary care patient of Dorcas Carrow, DO . The community resource team was consulted for assistance with Food Insecurity  Care guide performed the following interventions: Patient provided with information about care guide support team and interviewed to confirm resource needs Discussed resources to assist with food insecurity. We discussed and sent pt information on local food pantries in her area, and also placing a referral for [employer]. I am requesting a $250 gift card to [employer] from the [facility] so that pt can use the pantries for the staples they provide but having the gift card would allow her to make sure that she is able to get fresh fruits and veggies.  Obtained verbal consent to place patient referral to [employer] Placed referral to [facility] via secure email referral .  Follow Up Plan:  No further follow up planned at this time. The patient has been provided with needed resources.  Rojelio Brenner Care Guide, Embedded Care Coordination [facility], Care Management Phone: [phone] Email: [email or web address]

## 2020-10-13 ENCOUNTER — Telehealth: Payer: Self-pay | Admitting: Family Medicine

## 2020-10-13 ENCOUNTER — Other Ambulatory Visit: Payer: Self-pay | Admitting: Family Medicine

## 2020-10-13 NOTE — Telephone Encounter (Signed)
   Telephone encounter was:  Successful.  10/13/2020 Name: [name] MRN: [id number] DOB: Mar 10, 1961  Lisa Galvan is a 60 y.o. year old female who is a primary care patient of Dorcas Carrow, DO . The community resource team was consulted for assistance with Food Insecurity  Care guide performed the following interventions: Follow up call placed to community resources to determine status of patients referral Follow up call placed to the patient to discuss status of referral Due to income patient was not approved to receive a food gift card from [facility]. Explained this to patient and reminded her that I did send her information on food pantries in her area that she can utilize .  Follow Up Plan:  No further follow up planned at this time. The patient has been provided with needed resources.  Rojelio Brenner Care Guide, Embedded Care Coordination [facility], Care Management Phone: [phone] Email: [email or web address]

## 2020-10-13 NOTE — Telephone Encounter (Signed)
Requested medication (s) are due for refill today: historical med  Requested medication (s) are on the active medication list: yes  Last refill:  07/06/20  Future visit scheduled: yes in 1 month   Notes to clinic:  historical provider do you want to renew Rx?     Requested Prescriptions  Pending Prescriptions Disp Refills   Accu-Chek Softclix Lancets lancets [Pharmacy Med Name: SOFTCLIX LANCETS] 100 each     Sig: TEST TWICE DAILY      Endocrinology: Diabetes - Testing Supplies Passed - 10/13/2020  5:47 PM      Passed - Valid encounter within last 12 months    Recent Outpatient Visits           1 month ago Mouth swelling   [facility] [place], Lauren A, NP   1 month ago Essential hypertension   [facility] [place], [place], DO   3 months ago Breast pain   [facility] [place], Megan P, DO   4 months ago Type 2 diabetes mellitus with diabetic neuropathy, without long-term current use of insulin (HCC)   [facility] [place], Megan P, DO   7 months ago Routine general medical examination at a health care facility   [facility] [place], [place], DO       Future Appointments             In 1 month Laural Benes, Oralia Rud, DO [employer], [facility]

## 2020-10-14 NOTE — Telephone Encounter (Signed)
Pt is scheduled 8/12 

## 2020-10-17 ENCOUNTER — Telehealth: Payer: Self-pay | Admitting: Surgery

## 2020-10-17 NOTE — Telephone Encounter (Signed)
At patient's request her surgery is cancelled last minute.  She has a [employer] [place], she had this reinstated but they will not approve as she can't get a PCP from that plan to refer her.  She has been having a lot of issues with this insurance.  At our last conversation she wanted to proceed with surgery and was aware that she could possibly be at self pay with her HMO.  Today last minute, patient wants to cancel her surgery as she is now trying to get new insurance with [employer].  Patient will call me back once she gets her insurance figured out.

## 2020-10-18 ENCOUNTER — Ambulatory Visit: Admission: RE | Admit: 2020-10-18 | Payer: BLUE CROSS/BLUE SHIELD | Source: Home / Self Care | Admitting: Surgery

## 2020-10-18 ENCOUNTER — Encounter: Admission: RE | Payer: Self-pay | Source: Home / Self Care

## 2020-10-18 SURGERY — REPAIR, HERNIA, VENTRAL
Anesthesia: General

## 2020-10-19 ENCOUNTER — Ambulatory Visit: Payer: Self-pay

## 2020-10-19 NOTE — Telephone Encounter (Signed)
Reason for Disposition  [1] MILD or MODERATE vomiting AND [2] present > 48 hours (2 days) (Exception: mild vomiting with associated diarrhea)  Answer Assessment - Initial Assessment Questions 1. VOMITING SEVERITY: "How many times have you vomited in the past 24 hours?"     - MILD:  1 - 2 times/day    - MODERATE: 3 - 5 times/day, decreased oral intake without significant weight loss or symptoms of dehydration    - SEVERE: 6 or more times/day, vomits everything or nearly everything, with significant weight loss, symptoms of dehydration      3 2. ONSET: "When did the vomiting begin?"      7 days ago 3. FLUIDS: "What fluids or food have you vomited up today?" "Have you been able to keep any fluids down?"     No 4. ABDOMINAL PAIN: "Are your having any abdominal pain?" If yes : "How bad is it and what does it feel like?" (e.g., crampy, dull, intermittent, constant)      No 5. DIARRHEA: "Is there any diarrhea?" If Yes, ask: "How many times today?"      No 6. CONTACTS: "Is there anyone else in the family with the same symptoms?"      No 7. CAUSE: "What do you think is causing your vomiting?"     Unsure 8. HYDRATION STATUS: "Any signs of dehydration?" (e.g., dry mouth [not only dry lips], too weak to stand) "When did you last urinate?"     No 9. OTHER SYMPTOMS: "Do you have any other symptoms?" (e.g., fever, headache, vertigo, vomiting blood or coffee grounds, recent head injury)     No 10. PREGNANCY: "Is there any chance you are pregnant?" "When was your last menstrual period?"       No  Protocols used: Vomiting-A-AH

## 2020-10-19 NOTE — Telephone Encounter (Signed)
Pt. Reports she has been vomiting after eating and drinking x 7 days. Went to have surgery yesterday "and my potassium was low, so they gave me IV fluids." Reports no signs of dehydration - urine clear yellow. No other symptoms. Wants to see Dr. Laural Benes only. Appointment made. Instructed to go to ED for worsening symptoms.

## 2020-10-20 ENCOUNTER — Encounter: Payer: Self-pay | Admitting: Family Medicine

## 2020-10-20 ENCOUNTER — Other Ambulatory Visit: Payer: Self-pay

## 2020-10-20 ENCOUNTER — Ambulatory Visit: Payer: BLUE CROSS/BLUE SHIELD | Admitting: Family Medicine

## 2020-10-20 VITALS — BP 125/86 | HR 88 | Temp 98.2°F | Wt 168.2 lb

## 2020-10-20 DIAGNOSIS — R110 Nausea: Secondary | ICD-10-CM | POA: Diagnosis not present

## 2020-10-20 DIAGNOSIS — R634 Abnormal weight loss: Secondary | ICD-10-CM

## 2020-10-20 DIAGNOSIS — R1013 Epigastric pain: Secondary | ICD-10-CM

## 2020-10-20 DIAGNOSIS — E876 Hypokalemia: Secondary | ICD-10-CM | POA: Diagnosis not present

## 2020-10-20 MED ORDER — OMEPRAZOLE 40 MG PO CPDR: DELAYED_RELEASE_CAPSULE | ORAL | 1 refills | Status: DC

## 2020-10-20 MED ORDER — SUCRALFATE 1 G PO TABS: 1.0000 g | ORAL_TABLET | Freq: Three times a day (TID) | ORAL | 2 refills | Status: DC

## 2020-10-20 MED ORDER — ONDANSETRON 4 MG PO TBDP: 4.0000 mg | ORAL_TABLET | Freq: Three times a day (TID) | ORAL | 6 refills | Status: DC | PRN

## 2020-10-20 NOTE — Addendum Note (Signed)
Addended byMortimer Fries on: 10/20/2020 02:16 PM   Modules accepted: Orders

## 2020-10-20 NOTE — Progress Notes (Signed)
BP 125/86   Pulse 88   Temp 98.2 F (36.8 C)   Wt 168 lb 3.2 oz (76.3 kg)   SpO2 99%   BMI 29.80 kg/m    Subjective:    Patient ID: Lisa Galvan, female    DOB: 06-Jul-1960, 60 y.o.   MRN: [id number]  HPI: Lisa Galvan is a 60 y.o. female  Chief Complaint  Patient presents with   Emesis    Patient states she has been vomiting and had diarrhea last week. Patient is still having vomit, last episode was yesterday. Patient states she usually vomits about 30-45 min after meal, she describes heart burn after meals but goes away after she vomits.    ABDOMINAL ISSUES Duration: about a week Nature: epigastric discomfort- more vomiting and diarrhea Frequency: 3x a day Alleviating factors: Aggravating factors: Treatments attempted: imodium, OTC anti-nausea, nausea pill Constipation: no Diarrhea: yes Episodes of diarrhea/day: 3x a day Mucous in the stool: no Heartburn: yes Bloating:no Flatulence: no Nausea: yes Vomiting: yes Episodes of vomit/day: everytime she eats, only with solids Melena or hematochezia: no Rash: no Jaundice: no Fever: no Weight loss: yes- 20lbs in the last month  Relevant past medical, surgical, family and social history reviewed and updated as indicated. Interim medical history since our last visit reviewed. Allergies and medications reviewed and updated.  Review of Systems  Constitutional: Negative.   Respiratory: Negative.    Cardiovascular: Negative.   Gastrointestinal:  Positive for abdominal pain, diarrhea, nausea and vomiting. Negative for abdominal distention, anal bleeding, blood in stool, constipation and rectal pain.  Musculoskeletal: Negative.   Skin: Negative.   Neurological: Negative.   Psychiatric/Behavioral: Negative.     Per HPI unless specifically indicated above     Objective:    BP 125/86   Pulse 88   Temp 98.2 F (36.8 C)   Wt 168 lb 3.2 oz (76.3 kg)   SpO2 99%   BMI 29.80 kg/m   Wt Readings from Last 3 Encounters:   10/20/20 168 lb 3.2 oz (76.3 kg)  10/10/20 172 lb 13.5 oz (78.4 kg)  10/07/20 173 lb (78.5 kg)    Physical Exam Vitals and nursing note reviewed.  Constitutional:      General: She is not in acute distress.    Appearance: Normal appearance. She is not ill-appearing, toxic-appearing or diaphoretic.  HENT:     Head: Normocephalic and atraumatic.     Right Ear: External ear normal.     Left Ear: External ear normal.     Nose: Nose normal.     Mouth/Throat:     Mouth: Mucous membranes are moist.     Pharynx: Oropharynx is clear.  Eyes:     General: No scleral icterus.       Right eye: No discharge.        Left eye: No discharge.     Extraocular Movements: Extraocular movements intact.     Conjunctiva/sclera: Conjunctivae normal.     Pupils: Pupils are equal, round, and reactive to light.  Cardiovascular:     Rate and Rhythm: Normal rate and regular rhythm.     Pulses: Normal pulses.     Heart sounds: Normal heart sounds. No murmur heard.   No friction rub. No gallop.  Pulmonary:     Effort: Pulmonary effort is normal. No respiratory distress.     Breath sounds: Normal breath sounds. No stridor. No wheezing, rhonchi or rales.  Chest:     Chest wall: No tenderness.  Musculoskeletal:        General: Normal range of motion.     Cervical back: Normal range of motion and neck supple.  Skin:    General: Skin is warm and dry.     Capillary Refill: Capillary refill takes less than 2 seconds.     Coloration: Skin is not jaundiced or pale.     Findings: No bruising, erythema, lesion or rash.  Neurological:     General: No focal deficit present.     Mental Status: She is alert and oriented to person, place, and time. Mental status is at baseline.  Psychiatric:        Mood and Affect: Mood normal.        Behavior: Behavior normal.        Thought Content: Thought content normal.        Judgment: Judgment normal.    Results for orders placed or performed during the hospital  encounter of 10/10/20  Resp Panel by RT-PCR (Flu A&B, Covid) Nasopharyngeal Swab   Specimen: Nasopharyngeal Swab; Nasopharyngeal(NP) swabs in vial transport medium  Result Value Ref Range   SARS Coronavirus 2 by RT PCR NEGATIVE NEGATIVE   Influenza A by PCR NEGATIVE NEGATIVE   Influenza B by PCR NEGATIVE NEGATIVE  Basic metabolic panel  Result Value Ref Range   Sodium 134 (L) 135 - 145 mmol/L   Potassium 2.4 (LL) 3.5 - 5.1 mmol/L   Chloride 91 (L) 98 - 111 mmol/L   CO2 29 22 - 32 mmol/L   Glucose, Bld 202 (H) 70 - 99 mg/dL   BUN <5 (L) 8 - 23 mg/dL   Creatinine, Ser 3.38 0.44 - 1.00 mg/dL   Calcium 9.6 8.9 - 25.0 mg/dL   GFR, Estimated >53 >97 mL/min   Anion gap 14 5 - 15  CBC  Result Value Ref Range   WBC 5.6 4.0 - 10.5 K/uL   RBC 4.91 3.87 - 5.11 MIL/uL   Hemoglobin 15.2 (H) 12.0 - 15.0 g/dL   HCT 67.3 41.9 - 37.9 %   MCV 85.7 80.0 - 100.0 fL   MCH 31.0 26.0 - 34.0 pg   MCHC 36.1 (H) 30.0 - 36.0 g/dL   RDW 02.4 09.7 - 35.3 %   Platelets 303 150 - 400 K/uL   nRBC 0.0 0.0 - 0.2 %  Troponin I (High Sensitivity)  Result Value Ref Range   Troponin I (High Sensitivity) 6 <18 ng/L      Assessment & Plan:   Problem List Items Addressed This Visit   None Visit Diagnoses     Epigastric pain    -  Primary   Will treat with omeprazole and carafate. Check labs and stool studies- including H pylori. Will get her back in with her GI doctor. Appointment scheduled 8/16   Relevant Orders   CBC with Differential/Platelet   Comprehensive metabolic panel   TSH   Lipase   Amylase   Stool Culture   Stool C-Diff Toxin Assay   H. pylori antigen, stool   Fecal leukocytes   Ova and parasite examination   Fecal occult blood, imunochemical([facility])   Nausea       Checking labs and treating as above. Zofran also sent to her pharmacy. Call with any concerns.    Relevant Orders   CBC with Differential/Platelet   Comprehensive metabolic panel   TSH   Lipase   Amylase   Stool  Culture   Stool C-Diff Toxin Assay   H.  pylori antigen, stool   Fecal leukocytes   Ova and parasite examination   Fecal occult blood, imunochemical([facility])   Weight loss       20lbs of unintended weight loss in the last month due to nausea/stomach upset. Will get her back into GI- appt scheduled today for 8/16   Relevant Orders   CBC with Differential/Platelet   Comprehensive metabolic panel   TSH   Lipase   Amylase   Stool Culture   Stool C-Diff Toxin Assay   H. pylori antigen, stool   Fecal leukocytes   Ova and parasite examination   Fecal occult blood, imunochemical([facility])   Hypokalemia       Rechecking labs today. Await results.    Relevant Orders   Basic metabolic panel        Follow up plan: Return in about 4 weeks (around 11/17/2020).  >30 minutes spent with patient today.

## 2020-10-21 ENCOUNTER — Other Ambulatory Visit: Payer: Self-pay | Admitting: Family Medicine

## 2020-10-21 DIAGNOSIS — E876 Hypokalemia: Secondary | ICD-10-CM

## 2020-10-21 LAB — COMPREHENSIVE METABOLIC PANEL
ALT: 40 IU/L — ABNORMAL HIGH (ref 0–32)
AST: 72 IU/L — ABNORMAL HIGH (ref 0–40)
Albumin/Globulin Ratio: 1.9 (ref 1.2–2.2)
Albumin: 3.9 g/dL (ref 3.8–4.8)
Alkaline Phosphatase: 95 IU/L (ref 44–121)
BUN/Creatinine Ratio: 5 — ABNORMAL LOW (ref 12–28)
BUN: 4 mg/dL — ABNORMAL LOW (ref 8–27)
Bilirubin Total: 1.5 mg/dL — ABNORMAL HIGH (ref 0.0–1.2)
CO2: 23 mmol/L (ref 20–29)
Calcium: 9.7 mg/dL (ref 8.7–10.3)
Chloride: 97 mmol/L (ref 96–106)
Creatinine, Ser: 0.83 mg/dL (ref 0.57–1.00)
Globulin, Total: 2.1 g/dL (ref 1.5–4.5)
Glucose: 132 mg/dL — ABNORMAL HIGH (ref 65–99)
Potassium: 3.4 mmol/L — ABNORMAL LOW (ref 3.5–5.2)
Sodium: 139 mmol/L (ref 134–144)
Total Protein: 6 g/dL (ref 6.0–8.5)
eGFR: 80 mL/min/{1.73_m2} (ref >59)

## 2020-10-21 LAB — CBC WITH DIFFERENTIAL/PLATELET
Basophils Absolute: 0 10*3/uL (ref 0.0–0.2)
Basos: 1 %
EOS (ABSOLUTE): 0 10*3/uL (ref 0.0–0.4)
Eos: 1 %
Hematocrit: 42.2 % (ref 34.0–46.6)
Hemoglobin: 14.2 g/dL (ref 11.1–15.9)
Immature Grans (Abs): 0 10*3/uL (ref 0.0–0.1)
Immature Granulocytes: 0 %
Lymphocytes Absolute: 2.2 10*3/uL (ref 0.7–3.1)
Lymphs: 54 %
MCH: 30.1 pg (ref 26.6–33.0)
MCHC: 33.6 g/dL (ref 31.5–35.7)
MCV: 89 fL (ref 79–97)
Monocytes Absolute: 0.3 10*3/uL (ref 0.1–0.9)
Monocytes: 8 %
Neutrophils Absolute: 1.4 10*3/uL (ref 1.4–7.0)
Neutrophils: 36 %
Platelets: 219 10*3/uL (ref 150–450)
RBC: 4.72 x10E6/uL (ref 3.77–5.28)
RDW: 14.4 % (ref 11.7–15.4)
WBC: 4 10*3/uL (ref 3.4–10.8)

## 2020-10-21 LAB — AMYLASE: Amylase: 58 U/L (ref 31–110)

## 2020-10-21 LAB — TSH: TSH: 0.956 u[IU]/mL (ref 0.450–4.500)

## 2020-10-21 LAB — LIPASE: Lipase: 10 U/L — ABNORMAL LOW (ref 14–72)

## 2020-10-21 NOTE — Progress Notes (Signed)
Patient aware of results, no further questions. Made apt for lab draw.

## 2020-10-22 LAB — FECAL OCCULT BLOOD, IMMUNOCHEMICAL: Fecal Occult Bld: NEGATIVE

## 2020-10-25 LAB — STOOL CULTURE: E coli, Shiga toxin Assay: NEGATIVE

## 2020-10-25 LAB — FECAL LEUKOCYTES

## 2020-10-25 LAB — H. PYLORI ANTIGEN, STOOL: H pylori Ag, Stl: NEGATIVE

## 2020-10-25 LAB — CLOSTRIDIUM DIFFICILE EIA: C difficile Toxins A+B, EIA: NEGATIVE

## 2020-10-27 ENCOUNTER — Encounter: Payer: Self-pay | Admitting: Family Medicine

## 2020-10-27 ENCOUNTER — Ambulatory Visit: Payer: Self-pay

## 2020-10-27 ENCOUNTER — Telehealth: Payer: Self-pay

## 2020-10-27 ENCOUNTER — Other Ambulatory Visit: Payer: Self-pay | Admitting: Family Medicine

## 2020-10-27 DIAGNOSIS — E782 Mixed hyperlipidemia: Secondary | ICD-10-CM | POA: Diagnosis not present

## 2020-10-27 DIAGNOSIS — R9431 Abnormal electrocardiogram [ECG] [EKG]: Secondary | ICD-10-CM | POA: Insufficient documentation

## 2020-10-27 DIAGNOSIS — R0602 Shortness of breath: Secondary | ICD-10-CM | POA: Diagnosis not present

## 2020-10-27 DIAGNOSIS — I700 Atherosclerosis of aorta: Secondary | ICD-10-CM | POA: Diagnosis not present

## 2020-10-27 DIAGNOSIS — I10 Essential (primary) hypertension: Secondary | ICD-10-CM | POA: Diagnosis not present

## 2020-10-27 LAB — FECAL LEUKOCYTES

## 2020-10-27 LAB — OVA AND PARASITE EXAMINATION

## 2020-10-27 NOTE — Telephone Encounter (Signed)
Please advise pt seen 6/30

## 2020-10-27 NOTE — Telephone Encounter (Signed)
Patient called to request work note for today and tomorrow. Patient stated that she saw the cardiologist today and received the voicemail from our office advising her to go to the hospital.  Informed patient that Dr. Laural Benes will provide a work note as she is requesting and reiterated that Dr. Laural Benes has instructed patient to return to the hospital if she is still vomiting. Also, informed patient that note would be available in MyChart.

## 2020-10-27 NOTE — Telephone Encounter (Signed)
Left message for patient advising her of Dr.Johnson's recommendations and to give our office a call back if she has any questions or concerns.

## 2020-10-27 NOTE — Telephone Encounter (Signed)
Patient called in to say that she saw Dr Laural Benes a few days ago for vomiting and have been given medication which she took but still vomiting and feeling very weak. Please call patient at Ph# [phone]   Pt. Reports she is still vomiting 2-3 times a day. Zofran "helps a little." Trying to stay hydrated. Has an appointment with cardiology today.Requesting a note to stay out of work for 1 week. Please advise pt.

## 2020-10-27 NOTE — Telephone Encounter (Signed)
If she can't keep anything down I really think she needs to go to the ER to make sure she's staying hydrated.

## 2020-10-28 ENCOUNTER — Telehealth: Payer: Self-pay | Admitting: Surgery

## 2020-10-28 NOTE — Telephone Encounter (Signed)
Inbound cal l from the patient requesting a call back from the surgical coordinator to discuss "where she is at" w/scheduling her surgery.  Plz return her call upon your return to the office on Monday.  Thank you

## 2020-10-31 NOTE — Telephone Encounter (Signed)
Incoming call from patient.  She states that she should have the information that she needs from her health insurance next week and will call once she gets her new subscriber number. States she is still with [employer] but under a different plan with them now.   Patient further states that she is currently undergoing some additional testing with cardiology and wants to await all of the above prior to rescheduling surgery. She will call when ready.

## 2020-10-31 NOTE — Telephone Encounter (Signed)
Outgoing call is made, left message for patient to call me regarding status of rescheduling her surgery.  She was pending trying to get new health insurance.

## 2020-11-07 ENCOUNTER — Other Ambulatory Visit: Payer: Self-pay

## 2020-11-07 ENCOUNTER — Other Ambulatory Visit: Payer: BLUE CROSS/BLUE SHIELD

## 2020-11-07 DIAGNOSIS — E876 Hypokalemia: Secondary | ICD-10-CM

## 2020-11-08 ENCOUNTER — Telehealth: Payer: Self-pay

## 2020-11-08 ENCOUNTER — Telehealth: Payer: Self-pay | Admitting: Surgery

## 2020-11-08 LAB — BASIC METABOLIC PANEL
BUN/Creatinine Ratio: 3 — ABNORMAL LOW (ref 12–28)
BUN: 2 mg/dL — ABNORMAL LOW (ref 8–27)
CO2: 22 mmol/L (ref 20–29)
Calcium: 9.4 mg/dL (ref 8.7–10.3)
Chloride: 97 mmol/L (ref 96–106)
Creatinine, Ser: 0.78 mg/dL (ref 0.57–1.00)
Glucose: 134 mg/dL — ABNORMAL HIGH (ref 65–99)
Potassium: 3.3 mmol/L — ABNORMAL LOW (ref 3.5–5.2)
Sodium: 139 mmol/L (ref 134–144)
eGFR: 86 mL/min/{1.73_m2} (ref >59)

## 2020-11-08 NOTE — Telephone Encounter (Signed)
Update information regarding rescheduled surgery.  Follow up for patient also made for 11/23/20  @ 9:45 for patient to see Dr. Everlene Farrier to follow up prior to rescheduled surgery.    Patient has been advised of Pre-Admission date/time, COVID Testing date and Surgery date.  Surgery Date: 11/29/20 Preadmission Testing Date: 11/22/20 (phone 8a-1p) Covid Testing Date: Not needed.    Patient has been made aware to call [phone], between 1-3:00pm the day before surgery, to find out what time to arrive for surgery.

## 2020-11-08 NOTE — Telephone Encounter (Signed)
Patient aware provider has not yet reviewed results. Advised we will call her when results are ready.

## 2020-11-08 NOTE — Telephone Encounter (Signed)
Copied from CRM [id number]. Topic: General - Call Back - No Documentation >> Nov 08, 2020  7:25 AM Lisa Galvan wrote: Reason for CRM: pt would like the results of her labs from yesterday,

## 2020-11-09 ENCOUNTER — Other Ambulatory Visit: Payer: Self-pay | Admitting: Family Medicine

## 2020-11-09 DIAGNOSIS — E876 Hypokalemia: Secondary | ICD-10-CM

## 2020-11-09 MED ORDER — POTASSIUM CHLORIDE CRYS ER 20 MEQ PO TBCR: 20.0000 meq | EXTENDED_RELEASE_TABLET | Freq: Every day | ORAL | 3 refills | Status: DC

## 2020-11-14 ENCOUNTER — Ambulatory Visit: Payer: BC Managed Care – PPO | Admitting: Licensed Clinical Social Worker

## 2020-11-16 ENCOUNTER — Other Ambulatory Visit: Payer: BC Managed Care – PPO

## 2020-11-16 ENCOUNTER — Other Ambulatory Visit: Payer: Self-pay

## 2020-11-16 DIAGNOSIS — E876 Hypokalemia: Secondary | ICD-10-CM

## 2020-11-16 NOTE — Chronic Care Management (AMB) (Signed)
Care Management Clinical Social Work Note  11/16/2020 Name: Lisa Galvan MRN: [id number] DOB: Sep 30, 1960  Lisa Galvan is a 60 y.o. year old female who is a primary care patient of Dorcas Carrow, DO.  The Care Management team was consulted for assistance with chronic disease management and coordination needs.  Engaged with patient by telephone for follow up visit in response to provider referral for social work chronic care management and care coordination services  Consent to Services:  Ms. Hefferan was given information about Care Management services today including:  Care Management services includes personalized support from designated clinical staff supervised by her physician, including individualized plan of care and coordination with other care providers 24/7 contact phone numbers for assistance for urgent and routine care needs. The patient may stop case management services at any time by phone call to the office staff.  Patient agreed to services and consent obtained.   Assessment: Patient is engaged in conversation, continues to maintain positive progress with care plan goals. Patient is currently experiencing difficulty with managing ongoing nausea and vomiting. Patient verbalized agreement to visit the ED if symptoms persist or worsen. She has an upcoming appointment with PCP to further address. See Care Plan below for interventions and patient self-care actives.  Recent life changes Efrain Sella: Management of health conditions  Recommendation: Patient may benefit from, and is in agreement to work with LCSW to address care coordination needs and will continue to work with the clinical team to address health care and disease management related needs.   Follow up Plan: Patient would like continued follow-up from CCM LCSW .  Follow up scheduled in 12/26/20. Patient will call office if needed prior to next encounter.  SDOH (Social Determinants of Health) assessments and interventions  performed:    Advanced Directives Status: Not addressed in this encounter.  Care Plan  Allergies  Allergen Reactions   Benazepril Swelling    Face and mouth swelling.   Ivp Dye [Iodinated Diagnostic Agents] Anaphylaxis    Patient unsure of reaction but in case of reaction d/t shellfish allergy   Lyrica [Pregabalin] Swelling    Face/ mouth swelled up balloon   Shellfish Allergy Anaphylaxis and Swelling    Betadine on skin is okay   Shellfish-Derived Products Anaphylaxis   Trulicity [Dulaglutide] Swelling    Face and mouth swelled up   Ace Inhibitors Swelling    Outpatient Encounter Medications as of 11/14/2020  Medication Sig   Accu-Chek Softclix Lancets lancets TEST TWICE DAILY   ALLERGY RELIEF 180 MG tablet TAKE 1 TABLET BY MOUTH EVERY DAY (Patient taking differently: Take 180 mg by mouth daily.)   amLODipine (NORVASC) 5 MG tablet TAKE 1 TABLET(5 MG) BY MOUTH DAILY (Patient taking differently: Take 5 mg by mouth daily.)   aspirin EC 81 MG tablet Take 81 mg by mouth daily. Swallow whole.   atorvastatin (LIPITOR) 20 MG tablet TAKE 1 TABLET(20 MG) BY MOUTH DAILY (Patient taking differently: Take 20 mg by mouth daily.)   carbidopa-levodopa (SINEMET IR) 25-100 MG tablet Take 1.5 tablets by mouth 3 (three) times daily.   Cholecalciferol (VITAMIN D-3) 5000 units TABS Take 5,000 Units by mouth daily.   DULoxetine (CYMBALTA) 60 MG capsule Take 1 capsule (60 mg total) by mouth daily.   empagliflozin (JARDIANCE) 25 MG TABS tablet Take 1 tablet (25 mg total) by mouth daily.   EPINEPHrine 0.3 mg/0.3 mL IJ SOAJ injection Inject 0.3 mg into the muscle as needed for anaphylaxis.   gabapentin (NEURONTIN) 100  MG capsule TAKE 1 CAPSULE(100 MG) BY MOUTH THREE TIMES DAILY   hydrochlorothiazide (HYDRODIURIL) 25 MG tablet Take 1 tablet (25 mg total) by mouth daily.   ketoconazole (NIZORAL) 2 % cream APPLY TOPICALLY TO THE AFFECTED AREA DAILY (Patient taking differently: Apply 1 application topically  daily as needed for irritation.)   losartan (COZAAR) 25 MG tablet Take 1 tablet (25 mg total) by mouth daily.   Magnesium Oxide 500 MG CAPS Take 1 capsule (500 mg total) by mouth in the morning.   metFORMIN (GLUCOPHAGE) 500 MG tablet Take 2 tablets (1,000 mg total) by mouth 2 (two) times daily with a meal.   montelukast (SINGULAIR) 10 MG tablet TAKE 1 TABLET(10 MG) BY MOUTH AT BEDTIME (Patient taking differently: Take 10 mg by mouth at bedtime.)   omeprazole (PRILOSEC) 40 MG capsule TAKE 1 CAPSULE(40 MG) BY MOUTH BID   ondansetron (ZOFRAN ODT) 4 MG disintegrating tablet Take 1-2 tablets (4-8 mg total) by mouth every 8 (eight) hours as needed for nausea or vomiting.   potassium chloride SA (KLOR-CON) 20 MEQ tablet Take 1 tablet (20 mEq total) by mouth daily.   sucralfate (CARAFATE) 1 g tablet Take 1 tablet (1 g total) by mouth 3 (three) times daily.   valACYclovir (VALTREX) 1000 MG tablet Take 1 tablet (1,000 mg total) by mouth 2 (two) times daily. (Patient taking differently: Take 1,000 mg by mouth 2 (two) times daily as needed (shingles).)   vitamin B-12 (CYANOCOBALAMIN) 1000 MCG tablet Take 1,000 mcg by mouth 2 (two) times daily.   No facility-administered encounter medications on file as of 11/14/2020.    Patient Active Problem List   Diagnosis Date Noted   Aortic atherosclerosis (HCC) 07/12/2020   OSA (obstructive sleep apnea) 05/23/2020   Coccygodynia 02/01/2020   Facial swelling 07/21/2019   Acute postoperative pain 06/30/2019   History of allergy to radiographic contrast media 06/02/2019   History of allergy to shellfish 06/02/2019   History of Allergy to iodine 06/02/2019    Class: History of   History of anaphylaxis 06/02/2019   Other spondylosis, sacral and sacrococcygeal region 04/21/2019   Abnormal MRI, cervical spine (02/08/2019) 03/25/2019   Lower extremity weakness (Bilateral) 03/25/2019   Coordination impairment (lower extremity) 03/25/2019   Hypomagnesemia 03/24/2019    Spondylosis without myelopathy or radiculopathy, lumbosacral region 03/24/2019   Lumbar Facet Hypertrophy 03/24/2019   Grade 1 Anterolisthesis of L4/5 03/11/2019   Chronic hip pain (Bilateral) (R>L) 03/11/2019   Chronic sacroiliac joint pain (Bilateral) 03/11/2019   Sacroiliac joint somatic dysfunction (Bilateral) 03/11/2019   Chronic feet pain (Primary Area of Pain) (Bilateral) 03/11/2019   Chronic leg and foot pain (Bilateral) 03/11/2019   Diabetic peripheral neuropathy (HCC) 03/11/2019   Chronic neuropathic pain 03/11/2019   Neurogenic pain 03/11/2019   Chronic musculoskeletal pain 03/11/2019   Osteoarthritis involving multiple joints 03/11/2019   Chronic pain syndrome 03/10/2019   Pharmacologic therapy 03/10/2019   Disorder of skeletal system 03/10/2019   Problems influencing health status 03/10/2019   Chronic low back pain (Third area of Pain) (Bilateral) w/o sciatica 03/10/2019   Chronic lower extremity pain (Secondary area of Pain) (Bilateral) 03/10/2019   Abnormal MRI, lumbar spine (02/08/2019) 03/10/2019   Essential hypertension 10/06/2018   Cholelithiasis 06/23/2018   Fatty liver 06/23/2018   Helicobacter pylori infection 05/13/2017   Mastalgia in female 04/09/2017   Bilateral leg edema 07/02/2016   DDD (degenerative disc disease), lumbar 12/16/2014   Lumbar facet syndrome (Bilateral) 12/16/2014   Sacroiliac joint dysfunction 12/16/2014  Neuropathy due to secondary diabetes (HCC) 12/16/2014   Vitamin D deficiency 12/13/2014   Morbid obesity (HCC) 12/10/2014   Type 2 diabetes mellitus with diabetic neuropathy, unspecified (HCC) 12/10/2014   Hyperlipidemia 12/10/2014   Gastroesophageal reflux disease without esophagitis 12/10/2014    Conditions to be addressed/monitored:  Stress ; Mental Health Concerns   Care Plan : General Social Work (Adult)  Updates made by Bridgett Larsson, LCSW since 11/16/2020 12:00 AM     Problem: I'm managing my physical and mental health  better now   Priority: Medium     Long-Range Goal: Depressive Symptoms Identified   Start Date: 03/21/2020  This Visit's Progress: On track  Recent Progress: On track  Priority: Medium  Note:   Current Barriers:  Limited social support Level of care concerns ADL IADL limitations Social Isolation Limited access to caregiver  Clinical Social Work Clinical Goal(s):  Over the next 120 days, patient will work with SW to address concerns related to gaining additional support/resource connection in order to maintain health  Interventions: Assessed patient's previous and current treatment, coping skills, support system and barriers to care  Patient interviewed and appropriate assessments performed Patient reports that she is experiencing nausea and vomiting for weeks. She is thinking about visiting the ED if symptoms persist and/or worsen. PCP has been notified and patient shared the medication prescribed from PCP works temporarily. Imodium has resolved the diarrhea Patient reports that she is unable to keep food down. Patient endorses a decline in balance, stating "it is very bad", in addition, to a decrease in energy. States that she has been able to keep water down Patient reports that she fell Saturday, July 23. She did not sustain any injuries Patient continues to monitor and track blood sugars which have been about 156. Patient reports weight loss over the weeks Patient is scheduled for surgery 11/29/20. Patient shared that her surgery was initially scheduled for 06/28 but her potassium was too low. She has upcoming lab appt for 07/ 27/22 Patient continues to spend time with family and visit the church CCM LCSW provided validation and encouragement Self-care strategies were discussed and patient was strongly encouraged to be aware of limitations to prevent exacerbation Patient reports difficulty managing ongoing stressors. Patient's mother has dementia and is residing in [place], [place].  Patient also suffered from a recent allergic reaction to medications, in addition, to an upcoming surgery to correct a hernia. Patient feels that medications often make her feel "not like myself" which results in withdrawn behavior and at times, low motivation. Denies SI/HI Patient reports having a strong support system that includes her best friend and church family. Patient's daughter will arrive on Monday with patient's granddaughter to provide additional support CCM provided support and validation. Psychoeducation was provided on how physical and mental health correlates with one another. LCSW commended patient for having strong insight of body and advocating for self regarding her medical treatment. Strategies to promote self-care and stress management was identified. Patient was successful in identifying protective factors to increase motivation Advised patient to contact CCM LCSW if future social work needs arise. Discussed plans with patient for ongoing care management follow up and provided patient with direct contact information for care management team Inter-disciplinary care team collaboration (see longitudinal plan of care)  Patient Self Care Activities:  Attend all scheduled appointments with providers Call provider office for new concerns or questions Utilize strategies discussed to assist in managing stress and promote self-care  Jenel Lucks, MSW, LCSW [facility] Care Management Friend  [email or web address] Phone [phone] 2:06 PM

## 2020-11-16 NOTE — Patient Instructions (Signed)
Visit Information   Goals Addressed               This Visit's Progress     Patient Stated     SW: "I need more support and resource information" (pt-stated)   On track     Timeframe:  Long-Range Goal Priority:  Medium Start Date:   06/20/20                        Expected End Date:  01/20/21                     Follow Up Date- 12/26/20   Patient Self Care Activities:  Attend all scheduled appointments with providers Call provider office for new concerns or questions Utilize strategies discussed      Other     SW-Make and Keep All Appointments   On track     Timeframe:  Long-Range Goal Priority:  Medium Start Date:   06/20/20                        Expected End Date:  11/19/20                     Follow Up Date- 11/14/20   Patient Self Care Activities:  Attend all scheduled appointments with providers Call provider office for new concerns or questions Utilize strategies discussed to assist in managing stress and promote self-care        Patient verbalizes understanding of instructions provided today and agrees to view in MyChart.   Telephone follow up appointment with care management team member scheduled for:12/26/20  Jenel Lucks, MSW, LCSW [facility] Care Management [facility]  [email or web address] Phone [phone] 2:13 PM

## 2020-11-17 ENCOUNTER — Ambulatory Visit: Payer: BLUE CROSS/BLUE SHIELD | Admitting: Family Medicine

## 2020-11-17 LAB — BASIC METABOLIC PANEL
BUN/Creatinine Ratio: 6 — ABNORMAL LOW (ref 12–28)
BUN: 5 mg/dL — ABNORMAL LOW (ref 8–27)
CO2: 23 mmol/L (ref 20–29)
Calcium: 9.8 mg/dL (ref 8.7–10.3)
Chloride: 95 mmol/L — ABNORMAL LOW (ref 96–106)
Creatinine, Ser: 0.79 mg/dL (ref 0.57–1.00)
Glucose: 97 mg/dL (ref 65–99)
Potassium: 3.4 mmol/L — ABNORMAL LOW (ref 3.5–5.2)
Sodium: 139 mmol/L (ref 134–144)
eGFR: 85 mL/min/{1.73_m2} (ref >59)

## 2020-11-18 DIAGNOSIS — R9431 Abnormal electrocardiogram [ECG] [EKG]: Secondary | ICD-10-CM | POA: Diagnosis not present

## 2020-11-21 ENCOUNTER — Ambulatory Visit: Payer: BC Managed Care – PPO | Admitting: Family Medicine

## 2020-11-21 ENCOUNTER — Encounter: Payer: Self-pay | Admitting: Family Medicine

## 2020-11-21 ENCOUNTER — Ambulatory Visit: Payer: BC Managed Care – PPO

## 2020-11-21 ENCOUNTER — Other Ambulatory Visit: Payer: Self-pay

## 2020-11-21 ENCOUNTER — Other Ambulatory Visit: Payer: Self-pay | Admitting: Family Medicine

## 2020-11-21 VITALS — BP 99/71 | HR 103 | Temp 97.7°F | Wt 159.6 lb

## 2020-11-21 DIAGNOSIS — R112 Nausea with vomiting, unspecified: Secondary | ICD-10-CM | POA: Diagnosis not present

## 2020-11-21 DIAGNOSIS — I9589 Other hypotension: Secondary | ICD-10-CM | POA: Diagnosis not present

## 2020-11-21 DIAGNOSIS — E1140 Type 2 diabetes mellitus with diabetic neuropathy, unspecified: Secondary | ICD-10-CM

## 2020-11-21 DIAGNOSIS — E1142 Type 2 diabetes mellitus with diabetic polyneuropathy: Secondary | ICD-10-CM

## 2020-11-21 DIAGNOSIS — E861 Hypovolemia: Secondary | ICD-10-CM

## 2020-11-21 DIAGNOSIS — R269 Unspecified abnormalities of gait and mobility: Secondary | ICD-10-CM

## 2020-11-21 DIAGNOSIS — I10 Essential (primary) hypertension: Secondary | ICD-10-CM

## 2020-11-21 DIAGNOSIS — E876 Hypokalemia: Secondary | ICD-10-CM | POA: Diagnosis not present

## 2020-11-21 DIAGNOSIS — E785 Hyperlipidemia, unspecified: Secondary | ICD-10-CM

## 2020-11-21 DIAGNOSIS — G20 Parkinson's disease: Secondary | ICD-10-CM

## 2020-11-21 MED ORDER — POTASSIUM CHLORIDE CRYS ER 10 MEQ PO TBCR: 30.0000 meq | EXTENDED_RELEASE_TABLET | Freq: Every day | ORAL | 1 refills | Status: DC

## 2020-11-21 NOTE — Progress Notes (Signed)
Chronic Care Management Pharmacy Note  11/21/2020 Name:  Lisa Galvan MRN:  [id number] DOB:  03/18/1961  Summary: Pt requesting increase on gabapentin - currently 100 mg TID with ongoing symptoms and causing disruption to sleep - reports nerve pain in legs/feet 5/10. Wanted to make Dr. Wynetta Emery aware in advance of today's appt 11/21/2020  Subjective: Lisa Galvan is an 60 y.o. year old female who is a primary patient of [name], DO.  The CCM team was consulted for assistance with disease management and care coordination needs.    Engaged with patient by telephone for follow up visit in response to provider referral for pharmacy case management and/or care coordination services.   Consent to Services:  Not eligible for CCM.  Patient Care Team: [name], DO as PCP - General (Family Medicine) Vanita Ingles, RN as Case Manager ([place]) Vladimir Faster, [facility] as Pharmacist (Pharmacist) Rebekah Chesterfield, LCSW as Social Worker (Licensed Clinical Social Worker)  Objective:  Lab Results  Component Value Date   CREATININE 0.79 11/16/2020   CREATININE 0.78 11/07/2020   CREATININE 0.83 10/20/2020    Lab Results  Component Value Date   HGBA1C 8.1 (H) 09/01/2020   Last diabetic Eye exam:  Lab Results  Component Value Date/Time   HMDIABEYEEXA No Retinopathy 08/19/2019 12:00 AM    Last diabetic Foot exam: No results found for: HMDIABFOOTEX      Component Value Date/Time   CHOL 153 09/01/2020 0823   TRIG 86 09/01/2020 0823   HDL 95 09/01/2020 0823   CHOLHDL 1.4 01/03/2017 1139   LDLCALC 42 09/01/2020 0823    Hepatic Function Latest Ref Rng & Units 10/20/2020 09/01/2020 02/18/2020  Total Protein 6.0 - 8.5 g/dL 6.0 6.4 7.0  Albumin 3.8 - 4.8 g/dL 3.9 4.2 4.6  AST 0 - 40 IU/L 72(H) 70(H) 63(H)  ALT 0 - 32 IU/L 40(H) 57(H) 45(H)  Alk Phosphatase 44 - 121 IU/L 95 120 96  Total Bilirubin 0.0 - 1.2 mg/dL 1.5(H) 1.0 1.2  Bilirubin, Direct 0.00 - 0.40 mg/dL - -  -    Lab Results  Component Value Date/Time   TSH 0.956 10/20/2020 11:27 AM   TSH 0.968 02/18/2020 01:14 PM    CBC Latest Ref Rng & Units 10/20/2020 10/10/2020 10/10/2020  WBC 3.4 - 10.8 x10E3/uL 4.0 5.6 4.7  Hemoglobin 11.1 - 15.9 g/dL 14.2 15.2(H) 14.9  Hematocrit 34.0 - 46.6 % 42.2 42.1 40.7  Platelets 150 - 450 x10E3/uL 219 303 282    Lab Results  Component Value Date/Time   VD25OH 74.0 09/01/2020 08:23 AM   VD25OH 44.1 02/18/2020 01:14 PM    Clinical ASCVD:  The 10-year ASCVD risk score Mikey Bussing DC Jr., et al., 2013) is: 8.4%   Values used to calculate the score:     Age: 62 years     Sex: Female     Is Non-Hispanic African American: Yes     Diabetic: Yes     Tobacco smoker: No     Systolic Blood Pressure: 100 mmHg     Is BP treated: Yes     HDL Cholesterol: 95 mg/dL     Total Cholesterol: 153 mg/dL    Social History   Tobacco Use  Smoking Status Never  Smokeless Tobacco Never   BP Readings from Last 3 Encounters:  10/20/20 125/86  10/10/20 (!) 145/88  09/26/20 104/74   Pulse Readings from Last 3 Encounters:  10/20/20 88  10/10/20 88  09/26/20 99   Wt Readings from Last 3 Encounters:  10/20/20 168 lb 3.2 oz (76.3 kg)  10/10/20 172 lb 13.5 oz (78.4 kg)  10/07/20 173 lb (78.5 kg)    Assessment: Review of patient past medical history, allergies, medications, health status, including review of consultants reports, laboratory and other test data, was performed as part of comprehensive evaluation and provision of chronic care management services.   SDOH:  (Social Determinants of Health) assessments and interventions performed: Yes.   CCM Care Plan  Allergies  Allergen Reactions   Benazepril Swelling    Face and mouth swelling.   Ivp Dye [Iodinated Diagnostic Agents] Anaphylaxis    Patient unsure of reaction but in case of reaction d/t shellfish allergy   Lyrica [Pregabalin] Swelling    Face/ mouth swelled up balloon   Shellfish Allergy Anaphylaxis and  Swelling    Betadine on skin is okay   Shellfish-Derived Products Anaphylaxis   Trulicity [Dulaglutide] Swelling    Face and mouth swelled up   Ace Inhibitors Swelling    Medications Reviewed Today     Reviewed by Merceda Elks, CPhT (Pharmacy Technician) on 11/16/20 at [place] List Status: Complete   Medication Order Taking? Sig Documenting Provider Last Dose Status Informant  Accu-Chek Softclix Lancets lancets [id number]  TEST TWICE DAILY [name], DO  Active Self  ALLERGY RELIEF 180 MG tablet [id number] Yes TAKE 1 TABLET BY MOUTH EVERY DAY  Patient taking differently: Take 180 mg by mouth daily.   Wynetta Emery, Megan P, DO  Active Self  amLODipine (NORVASC) 5 MG tablet [id number] Yes TAKE 1 TABLET(5 MG) BY MOUTH DAILY  Patient taking differently: Take 5 mg by mouth daily.   Park Liter P, DO  Active Self  aspirin EC 81 MG tablet [id number] Yes Take 81 mg by mouth daily. Swallow whole. [provider]  Active Self  atorvastatin (LIPITOR) 20 MG tablet [id number] Yes TAKE 1 TABLET(20 MG) BY MOUTH DAILY  Patient taking differently: Take 20 mg by mouth daily.   Park Liter [place], DO  Active Self    Discontinued 11/16/20 1545 (Patient Preference) Cholecalciferol (VITAMIN D-3) 5000 units TABS [id number] Yes Take 5,000 Units by mouth daily. [provider]  Active Self  DULoxetine (CYMBALTA) 60 MG capsule [id number] Yes Take 1 capsule (60 mg total) by mouth daily. Wynetta Emery, Megan P, DO  Active Self  empagliflozin (JARDIANCE) 25 MG TABS tablet [id number] Yes Take 1 tablet (25 mg total) by mouth daily. Wynetta Emery, Megan P, DO  Active Self  EPINEPHrine 0.3 mg/0.3 mL IJ SOAJ injection [id number] Yes Inject 0.3 mg into the muscle as needed for anaphylaxis. Johnson, Megan P, DO  Active Self  gabapentin (NEURONTIN) 100 MG capsule [id number] Yes TAKE 1 CAPSULE(100 MG) BY MOUTH THREE TIMES DAILY  Patient taking differently: Take 100 mg by mouth 3 (three) times daily.   Johnson, Megan  P, DO  Active Self  hydrochlorothiazide (HYDRODIURIL) 25 MG tablet [id number]  Take 1 tablet (25 mg total) by mouth daily. [name], DO  Active Self           Med Note Wilmon Pali, MELISSA R   Wed Nov 16, 2020  3:43 PM) ON HOLD   ketoconazole (NIZORAL) 2 % cream [id number] Yes APPLY TOPICALLY TO THE AFFECTED AREA DAILY  Patient taking differently: Apply 1 application topically daily as needed for irritation.   Johnson, Megan P, DO  Active Self  losartan (COZAAR) 25 MG tablet [id number] No Take  1 tablet (25 mg total) by mouth daily.  Patient not taking: No sig reported   Naaman Plummer, MD Not Taking Consider Medication Status and Discontinue (Change in therapy) Self  Magnesium 500 MG TABS [id number] Yes Take 500 mg by mouth daily. [provider]  Active Self    Discontinued 11/16/20 1546 (Patient Preference) metFORMIN (GLUCOPHAGE) 500 MG tablet [id number] Yes Take 2 tablets (1,000 mg total) by mouth 2 (two) times daily with a meal. McElwee, Lauren A, NP  Active Self  montelukast (SINGULAIR) 10 MG tablet [id number] Yes TAKE 1 TABLET(10 MG) BY MOUTH AT BEDTIME  Patient taking differently: Take 10 mg by mouth at bedtime.   Wynetta Emery, Megan P, DO  Active Self  omeprazole (PRILOSEC) 40 MG capsule [id number] Yes TAKE 1 CAPSULE(40 MG) BY MOUTH BID  Patient taking differently: Take 40 mg by mouth in the morning and at bedtime. TAKE 1 CAPSULE(40 MG) BY MOUTH BID   Wynetta Emery, Megan P, DO  Active Self  ondansetron (ZOFRAN ODT) 4 MG disintegrating tablet [id number] Yes Take 1-2 tablets (4-8 mg total) by mouth every 8 (eight) hours as needed for nausea or vomiting. Johnson, Megan P, DO  Active Self  pimecrolimus (ELIDEL) 1 % cream [id number] Yes Apply 1 application topically 2 (two) times daily as needed (irriatation). [provider]  Active Self  potassium chloride SA (KLOR-CON) 20 MEQ tablet [id number] Yes Take 1 tablet (20 mEq total) by mouth daily. Wynetta Emery, Megan P, DO  Active Self   sucralfate (CARAFATE) 1 g tablet [id number] Yes Take 1 tablet (1 g total) by mouth 3 (three) times daily. Wynetta Emery, Megan P, DO  Active Self  valACYclovir (VALTREX) 1000 MG tablet [id number] Yes Take 1 tablet (1,000 mg total) by mouth 2 (two) times daily.  Patient taking differently: Take 1,000 mg by mouth 2 (two) times daily as needed (shingles).   Park Liter P, DO  Active Self  vitamin B-12 (CYANOCOBALAMIN) 1000 MCG tablet [id number] Yes Take 1,000 mcg by mouth 2 (two) times daily. [provider]  Active Self  Med List Note Landis Martins, RN 02/03/20 1442): 02-03-20 PA sent for pregabalin 50 mg via CMM dw                 Patient Active Problem List   Diagnosis Date Noted   Aortic atherosclerosis ([place]) 07/12/2020   OSA (obstructive sleep apnea) 05/23/2020   Coccygodynia 02/01/2020   Facial swelling 07/21/2019   Acute postoperative pain 06/30/2019   History of allergy to radiographic contrast media 06/02/2019   History of allergy to shellfish 06/02/2019   History of Allergy to iodine 06/02/2019    Class: History of   History of anaphylaxis 06/02/2019   Other spondylosis, sacral and sacrococcygeal region 04/21/2019   Abnormal MRI, cervical spine (02/08/2019) 03/25/2019   Lower extremity weakness (Bilateral) 03/25/2019   Coordination impairment (lower extremity) 03/25/2019   Hypomagnesemia 03/24/2019   Spondylosis without myelopathy or radiculopathy, lumbosacral region 03/24/2019   Lumbar Facet Hypertrophy 03/24/2019   Grade 1 Anterolisthesis of L4/5 03/11/2019   Chronic hip pain (Bilateral) (R>L) 03/11/2019   Chronic sacroiliac joint pain (Bilateral) 03/11/2019   Sacroiliac joint somatic dysfunction (Bilateral) 03/11/2019   Chronic feet pain (Primary Area of Pain) (Bilateral) 03/11/2019   Chronic leg and foot pain (Bilateral) 03/11/2019   Diabetic peripheral neuropathy ([place]) 03/11/2019   Chronic neuropathic pain 03/11/2019   Neurogenic pain 03/11/2019    Chronic musculoskeletal pain 03/11/2019   Osteoarthritis involving multiple joints 03/11/2019  Chronic pain syndrome 03/10/2019   Pharmacologic therapy 03/10/2019   Disorder of skeletal system 03/10/2019   Problems influencing health status 03/10/2019   Chronic low back pain (Third area of Pain) (Bilateral) w/o sciatica 03/10/2019   Chronic lower extremity pain (Secondary area of Pain) (Bilateral) 03/10/2019   Abnormal MRI, lumbar spine (02/08/2019) 03/10/2019   Essential hypertension 10/06/2018   Cholelithiasis 06/23/2018   Fatty liver 38/93/7342   Helicobacter pylori infection 05/13/2017   Mastalgia in female 04/09/2017   Bilateral leg edema 07/02/2016   DDD (degenerative disc disease), lumbar 12/16/2014   Lumbar facet syndrome (Bilateral) 12/16/2014   Sacroiliac joint dysfunction 12/16/2014   Neuropathy due to secondary diabetes ([place]) 12/16/2014   Vitamin D deficiency 12/13/2014   Morbid obesity ([place]) 12/10/2014   Type 2 diabetes mellitus with diabetic neuropathy, unspecified ([place]) 12/10/2014   Hyperlipidemia 12/10/2014   Gastroesophageal reflux disease without esophagitis 12/10/2014    Immunization History  Administered Date(s) Administered   Influenza,inj,Quad PF,6+ Mos 05/19/2015, 02/16/2016, 01/03/2017, 01/02/2019, 02/18/2020   Influenza-Unspecified 05/19/2015, 02/16/2016, 01/03/2017, 07/03/2017, 01/03/2018, 01/02/2019   PFIZER(Purple Top)SARS-COV-2 Vaccination 06/12/2019, 01/25/2020, 01/25/2020   Pneumococcal Polysaccharide-23 07/03/2017   Td 01/03/2018    Conditions to be addressed/monitored: HTN OSA T2DM FLD HLD Morbid Obesity GERD Bilateral Edema  There are no care plans that you recently modified to display for this patient. Current Barriers:  Unable to maintain control of type 2 diabetes and neuropathy  Pharmacist Clinical Goal(s):  Patient will verbalize ability to afford treatment regimen contact provider office for questions/concerns as evidenced notation  of same in electronic health record through collaboration with PharmD and provider.   Interventions: 1:1 collaboration with [name], DO regarding development and update of comprehensive plan of care as evidenced by provider attestation and co-signature Inter-disciplinary care team collaboration (see longitudinal plan of care) Comprehensive medication review performed; medication list updated in electronic medical record  Hypertension  (Status:New goal.)   Med Management Intervention:  continue current management  (BP goal <130/80) -Controlled -Current treatment: Amlodipine 5 mg once daily Losartan 25 mg once daily Hydrochlorothiazide 25 mg once daily   -Current home readings: 110s/70s  -Denies hypotensive/hypertensive symptoms -Educated on Importance of home blood pressure monitoring; -Counseled to monitor BP at home 1-2x/week, document, and provide log at future appointments -Recommended to continue current medication  Hyperlipidemia: (LDL goal < 70) -Controlled -Current treatment: Atorvastatin 20 mg once daily -Reviewed side effects/tolerability - no problems noted.  -Educated on Cholesterol goals;  -Recommended to continue current medication  Diabetes (A1c goal <8% over next three months) -Not ideally controlled -Current medications: Jardiance 25 mg once daily Metformin 500 mg tablets - two tabs (1000 mg) twice daily with meal -Medications previously tried: Invokana, Trulicity, Actos,  -Current home glucose readings fasting glucose: 121  post prandial glucose: none provided -Denies hypoglycemic/hyperglycemic symptoms -Current meal patterns: trying to get back to routine meals, has not been able to get down solid foods recently -Educated on A1c and blood sugar goals; -Counseled to check feet daily and get yearly eye exams -Counseled on diet and exercise extensively Recommended to continue current medication  Peripheral neuropathy (Goal: minimize symptoms,  optimize medication safety) -Controlled -Feels like gabapentin dose may not be strong enough. Nerve pain in legs/feet has been more bothersome, currently 5/10 pian. Would like to consider a dose increase.  -Current treatment  Duloxetine 60 mg once daily Gabapentin 100 mg three times daily  -Medications previously tried: Lyrica  -Collaborated with PCP regarding pt request for gabapentin  dose increase  Patient Goals/Self-Care Activities Patient will:  - take medications as prescribed engage in dietary modifications by reducing intake of foods high in carbohydrates (bread, rice, potatoes, crackers)  Medication Assistance: None required.  Patient affirms current coverage meets needs.  Patient's preferred pharmacy is:  [id number] - [place], [place] AT [facility] OF [address] & [address] [place] [address] Phone: [phone] Fax: [phone]  Future Appointments  Date Time Provider [place]  11/21/2020  1:00 PM [name], DO CFP-CFP PEC  11/22/2020  9:00 AM [facility] PAT2 [facility] None  11/23/2020  9:45 AM Jules Husbands, MD AS-AS None  11/29/2020  9:45 AM CFP CCM CASE MANAGER CFP-CFP PEC  12/02/2020  9:00 AM [name], DO CFP-CFP PEC  12/06/2020  9:30 AM Lin Landsman, MD AGI-AGIB None  12/26/2020  1:00 PM CFP CCM SOCIAL WORK CFP-CFP PEC  Follow Up Plan: RPH f/u call 6 months. Follow Up:  Patient agrees to Care Plan and Follow-up.  Madelin Rear, PharmD, CPP Clinical Pharmacist Practitioner  [facility]  [phone]

## 2020-11-21 NOTE — Progress Notes (Signed)
BP 99/71   Pulse (!) 103   Temp 97.7 F (36.5 C) (Oral)   Wt 159 lb 9.6 oz (72.4 kg)   SpO2 97%   BMI 28.27 kg/m    Subjective:    Patient ID: Lisa Galvan, female    DOB: 17-Oct-1960, 60 y.o.   MRN: [id number]  HPI: Lisa Galvan is a 60 y.o. female  Chief Complaint  Patient presents with   Abdominal Pain    4 week f/up- pt states she is not in as much pain and is still throwing up some    ABDOMINAL ISSUES- zofran seems to helping a little bit Duration:  about 3 months Nature: epigastric pain Location: epigastric  Severity: moderate  Radiation: no Frequency: constant Treatments attempted: antacids, PPI, and H2 Blocker Constipation: no Diarrhea: no Mucous in the stool: no Heartburn: no Bloating:no Flatulence: no Nausea: yes Vomiting: every other day, usually in the AM, occassionally in the AM Episodes of vomit/day: 2-3x Melena or hematochezia: no Rash: no Jaundice: no Fever: no Weight loss: yes  Relevant past medical, surgical, family and social history reviewed and updated as indicated. Interim medical history since our last visit reviewed. Allergies and medications reviewed and updated.  Review of Systems  Constitutional:  Positive for appetite change, fatigue and unexpected weight change. Negative for activity change, chills, diaphoresis and fever.  Respiratory: Negative.    Cardiovascular: Negative.   Gastrointestinal:  Positive for abdominal pain and nausea. Negative for abdominal distention, anal bleeding, blood in stool, constipation, diarrhea, rectal pain and vomiting.  Musculoskeletal: Negative.   Skin: Negative.   Neurological:  Positive for weakness. Negative for dizziness, tremors, seizures, syncope, facial asymmetry, speech difficulty, light-headedness, numbness and headaches.  Psychiatric/Behavioral: Negative.     Per HPI unless specifically indicated above     Objective:    BP 99/71   Pulse (!) 103   Temp 97.7 F (36.5 C) (Oral)   Wt 159  lb 9.6 oz (72.4 kg)   SpO2 97%   BMI 28.27 kg/m   Wt Readings from Last 3 Encounters:  11/21/20 159 lb 9.6 oz (72.4 kg)  10/20/20 168 lb 3.2 oz (76.3 kg)  10/10/20 172 lb 13.5 oz (78.4 kg)    Physical Exam Vitals and nursing note reviewed.  Constitutional:      General: She is not in acute distress.    Appearance: Normal appearance. She is not ill-appearing, toxic-appearing or diaphoretic.  HENT:     Head: Normocephalic and atraumatic.     Right Ear: External ear normal.     Left Ear: External ear normal.     Nose: Nose normal.     Mouth/Throat:     Mouth: Mucous membranes are moist.     Pharynx: Oropharynx is clear.  Eyes:     General: No scleral icterus.       Right eye: No discharge.        Left eye: No discharge.     Extraocular Movements: Extraocular movements intact.     Conjunctiva/sclera: Conjunctivae normal.     Pupils: Pupils are equal, round, and reactive to light.  Cardiovascular:     Rate and Rhythm: Normal rate and regular rhythm.     Pulses: Normal pulses.     Heart sounds: Normal heart sounds. No murmur heard.   No friction rub. No gallop.  Pulmonary:     Effort: Pulmonary effort is normal. No respiratory distress.     Breath sounds: Normal breath sounds. No stridor. No  wheezing, rhonchi or rales.  Chest:     Chest wall: No tenderness.  Musculoskeletal:        General: Normal range of motion.     Cervical back: Normal range of motion and neck supple.  Skin:    General: Skin is warm and dry.     Capillary Refill: Capillary refill takes less than 2 seconds.     Coloration: Skin is not jaundiced or pale.     Findings: No bruising, erythema, lesion or rash.  Neurological:     General: No focal deficit present.     Mental Status: She is alert and oriented to person, place, and time. Mental status is at baseline.     Gait: Gait abnormal.  Psychiatric:        Mood and Affect: Mood normal.        Behavior: Behavior normal.        Thought Content: Thought  content normal.        Judgment: Judgment normal.    Results for orders placed or performed in visit on 16/10/96  Basic metabolic panel  Result Value Ref Range   Glucose 97 65 - 99 mg/dL   BUN 5 (L) 8 - 27 mg/dL   Creatinine, Ser 0.79 0.57 - 1.00 mg/dL   eGFR 85 >59 mL/min/1.73   BUN/Creatinine Ratio 6 (L) 12 - 28   Sodium 139 134 - 144 mmol/L   Potassium 3.4 (L) 3.5 - 5.2 mmol/L   Chloride 95 (L) 96 - 106 mmol/L   CO2 23 20 - 29 mmol/L   Calcium 9.8 8.7 - 10.3 mg/dL      Assessment & Plan:   Problem List Items Addressed This Visit       Endocrine   Diabetic peripheral neuropathy (HCC) (Chronic)   Relevant Orders   Ambulatory referral to [place]   Other Visit Diagnoses     Non-intractable vomiting with nausea, unspecified vomiting type    -  Primary   Only slightly better. Will try to get her into GI ASAP- call made today. Continue zofran. Continue to monitor    Hypotension due to hypovolemia       Will stop amlodipine. BP running low- will get her back in in 2 weeks for recheck.    Hypokalemia       Likely due to vomiting. Will try to get her into GI faster. Will increase her potassium to 6mq daily. Recheck 2 weeks.    Parkinson's disease (HC[facility]       Continue to follow with neurology. Will get home health out for PT. Continue to mointor.    Relevant Orders   [place]  Abnormal gait       Continue to follow with neurology. Will get home health out for PT. Continue to mointor.    Relevant Orders   Ambulatory referral to Home Health        Follow up plan: No follow-ups on file.

## 2020-11-22 ENCOUNTER — Encounter
Admission: RE | Admit: 2020-11-22 | Discharge: 2020-11-22 | Disposition: A | Discharge status: Home / Self Care | Payer: BC Managed Care – PPO | Source: Ambulatory Visit | Attending: Surgery | Admitting: Surgery

## 2020-11-22 ENCOUNTER — Telehealth: Payer: Self-pay

## 2020-11-22 NOTE — Telephone Encounter (Signed)
Because of her falls I do not want to increase her gabapentin right now. We can discuss it again in 2 weeks if she's started PT

## 2020-11-22 NOTE — Telephone Encounter (Signed)
Routing to provider. I did not see where an increased dose was sent in.    Copied from CRM [id number]. Topic: General - Other >> Nov 22, 2020  8:06 AM Traci Sermon wrote: Reason for CRM: Pt called in stating PCP was told her she would up her dosage for the gabapentin (NEURONTIN) 100 MG capsule, but pt states it has still not been sent over to the pharmacy. Pt also wanted to remind PCP to call Dr. Allegra Lai. Please advise.   [facility] #[id number] [place], [place] - [address] AT [facility] OF [address] [facility]  Phone:  [phone] Fax:  [phone]

## 2020-11-22 NOTE — Telephone Encounter (Signed)
Spoke with patient and notified her of recent recommendations. Patient verbalized understanding. Patient states she has not start physical therapy yet, as she has not heard from them yet.

## 2020-11-22 NOTE — Patient Instructions (Addendum)
Your procedure is scheduled on: 11/29/20 Report to DAY SURGERY DEPARTMENT LOCATED ON 2ND FLOOR MEDICAL MALL ENTRANCE. To find out your arrival time please call [phone] between 1PM - 3PM on 11/28/20.  Remember: Instructions that are not followed completely may result in serious medical risk, up to and including death, or upon the discretion of your surgeon and anesthesiologist your surgery may need to be rescheduled.     _X__ 1. Do not eat food after midnight the night before your procedure.                 No gum chewing or hard candies.   __X__2.  On the morning of surgery brush your teeth with toothpaste and water, you                 may rinse your mouth with mouthwash if you wish.  Do not swallow any              toothpaste of mouthwash.     _X__ 3.  No Alcohol for 24 hours before or after surgery.   _X__ 4.  Do Not Smoke or use e-cigarettes For 24 Hours Prior to Your Surgery.                 Do not use any chewable tobacco products for at least 6 hours prior to                 surgery.  ____  5.  Bring all medications with you on the day of surgery if instructed.   __X__  6.  Notify your doctor if there is any change in your medical condition      (cold, fever, infections).     Do not wear jewelry, make-up, hairpins, clips or nail polish. Do not wear lotions, powders, or perfumes.  Do not shave 48 hours prior to surgery. Men may shave face and neck. Do not bring valuables to the hospital.    [facility] is not responsible for any belongings or valuables.  Contacts, dentures/partials or body piercings may not be worn into surgery. Bring a case for your contacts, glasses or hearing aids, a denture cup will be supplied. Leave your suitcase in the car. After surgery it may be brought to your room. For patients admitted to the hospital, discharge time is determined by your treatment team.   Patients discharged the day of surgery will not be allowed to drive home.   Please read  over the following fact sheets that you were given:   Chg soap  __X__ Take these medicines the morning of surgery with A SIP OF WATER:    1. ALLERGY RELIEF 180 MG tablet  2. atorvastatin (LIPITOR) 20 MG tablet  3. DULoxetine (CYMBALTA) 60 MG capsule  4. gabapentin (NEURONTIN) 100 MG capsule  5. omeprazole (PRILOSEC) 40 MG capsule  6.  ____ Fleet Enema (as directed)   __X__ Use CHG Soap/SAGE wipes as directed  ____ Use inhalers on the day of surgery  __X__ Stop metformin 48 HOURS PRIOR TO SURGERY AND jARDIANCE 72 HOURS PRIOR TO SURGERY    ____ Take 1/2 of usual insulin dose the night before surgery. No insulin the morning          of surgery.   ____ Stop Blood Thinners Coumadin/Plavix/Xarelto/Pleta/Pradaxa/Eliquis/Effient/Aspirin  on   Or contact your Surgeon, Cardiologist or Medical Doctor regarding  ability to stop your blood thinners  __X__ Stop Anti-inflammatories 7 days before surgery such as  Advil, Ibuprofen, Motrin,  BC or Goodies Powder, Naprosyn, Naproxen, Aleve,   __X__ Stop all herbal supplements, fish oil or vitamin E until after surgery.    ____ Bring C-Pap to the hospital.    PER DR PABON, HOLD YOUR ASPIRIN FOR 5 DAYS PRIOR TO SURGERY

## 2020-11-22 NOTE — Telephone Encounter (Signed)
It will probably be a few days for them to contact her.

## 2020-11-22 NOTE — Telephone Encounter (Signed)
Patient notified

## 2020-11-23 ENCOUNTER — Other Ambulatory Visit: Payer: Self-pay

## 2020-11-23 ENCOUNTER — Ambulatory Visit: Payer: BC Managed Care – PPO | Admitting: Surgery

## 2020-11-23 ENCOUNTER — Encounter: Payer: Self-pay | Admitting: Surgery

## 2020-11-23 VITALS — BP 107/78 | HR 108 | Temp 98.1°F | Ht 63.0 in | Wt 161.0 lb

## 2020-11-23 DIAGNOSIS — I119 Hypertensive heart disease without heart failure: Secondary | ICD-10-CM | POA: Insufficient documentation

## 2020-11-23 DIAGNOSIS — K432 Incisional hernia without obstruction or gangrene: Secondary | ICD-10-CM

## 2020-11-23 DIAGNOSIS — I10 Essential (primary) hypertension: Secondary | ICD-10-CM | POA: Diagnosis not present

## 2020-11-23 DIAGNOSIS — I700 Atherosclerosis of aorta: Secondary | ICD-10-CM | POA: Diagnosis not present

## 2020-11-23 DIAGNOSIS — E782 Mixed hyperlipidemia: Secondary | ICD-10-CM | POA: Diagnosis not present

## 2020-11-23 NOTE — H&P (View-Only) (Signed)
Outpatient Surgical Follow Up  11/23/2020  Lisa Galvan is an 60 y.o. female.   Chief Complaint  Patient presents with   Follow-up    Pre-op to discuss hernia surgery    HPI:  60 yo female with hx of robotic recurrent ventral hernia repair by me over  1 year ago.  Now comes following up regarding a left subcostal hernia from my robotic repair. Last few months the defect has enlarged in size  She also had a recent CT scan that have personally reviewed confirming the left subcostal incisional hernia, there is also evidence of transverse colon within the hernia sac.Marland Kitchen No fevers no chills.  No nausea no vomiting.   SHe does report some intermittent pain around the hernia.  It is moderate to mild intensity worsening with Valsalva. She Does have a history of chronic back pain and she follows with the pain clinic. She comes accompanied by her daughter and granddaughter   Past Medical History:  Diagnosis Date   Allergy    Anemia    Arthritis    spine   Bilateral leg edema    Chest pain 12/15/2014   Chronic sciatica 12/10/2014   Essential hypertension 12/10/2014   Gastroesophageal reflux disease without esophagitis    Mixed hyperlipidemia    Obesity (BMI 30.0-34.9) 12/10/2014   Sleep apnea    waiting for cpap   Type 2 diabetes mellitus without complication (HCC) 12/10/2014   Umbilical hernia     Past Surgical History:  Procedure Laterality Date   ABDOMINAL HYSTERECTOMY     COLONOSCOPY WITH PROPOFOL N/A 03/30/2019   Procedure: COLONOSCOPY WITH PROPOFOL;  Surgeon: Toney Reil, MD;  Location: [facility] ENDOSCOPY;  Service: Gastroenterology;  Laterality: N/A;   ESOPHAGOGASTRODUODENOSCOPY (EGD) WITH PROPOFOL N/A 03/12/2017   Procedure: ESOPHAGOGASTRODUODENOSCOPY (EGD) WITH PROPOFOL;  Surgeon: Toney Reil, MD;  Location: [facility] ENDOSCOPY;  Service: Gastroenterology;  Laterality: N/A;   ESOPHAGOGASTRODUODENOSCOPY (EGD) WITH PROPOFOL N/A 03/30/2019   Procedure:  ESOPHAGOGASTRODUODENOSCOPY (EGD) WITH PROPOFOL;  Surgeon: Toney Reil, MD;  Location: [facility] ENDOSCOPY;  Service: Gastroenterology;  Laterality: N/A;   HERNIA REPAIR     ORIF ANKLE FRACTURE Left 07/30/2019   Procedure: OPEN REDUCTION INTERNAL FIXATION (ORIF) OF LEFT BIMALLEOLAR ANKLE FRACTURE;  Surgeon: Signa Kell, MD;  Location: [facility];  Service: Orthopedics;  Laterality: Left;  Diabetic - oral meds   UMBILICAL HERNIA REPAIR N/A 05/01/2017   Procedure: HERNIA REPAIR UMBILICAL ADULT;  Surgeon: Ancil Linsey, MD;  Location: [facility] ORS;  Service: General;  Laterality: N/A;   XI ROBOTIC ASSISTED VENTRAL HERNIA N/A 10/06/2019   Procedure: XI ROBOTIC ASSISTED VENTRAL HERNIA;  Surgeon: Leafy Ro, MD;  Location: [facility] ORS;  Service: General;  Laterality: N/A;    Family History  Problem Relation Age of Onset   Diabetes Mother    Cancer Mother        breast and stomach   Breast cancer Mother 64   Dementia Mother    Heart disease Father    Kidney disease Father        dialysis   Cancer Father        colon   Diabetes Father    Hypertension Sister    Hypertension Brother    Congestive Heart Failure Maternal Grandmother    Diabetes Maternal Grandmother    Hypertension Maternal Grandmother     Social History:  reports that she has never smoked. She has never used smokeless tobacco. She reports current alcohol use of about  3.0 standard drinks of alcohol per week. She reports that she does not use drugs.  Allergies:  Allergies  Allergen Reactions   Benazepril Swelling    Face and mouth swelling.   Ivp Dye [Iodinated Diagnostic Agents] Anaphylaxis    Patient unsure of reaction but in case of reaction d/t shellfish allergy   Lyrica [Pregabalin] Swelling    Face/ mouth swelled up balloon   Shellfish Allergy Anaphylaxis and Swelling    Betadine on skin is okay   Shellfish-Derived Products Anaphylaxis   Trulicity [Dulaglutide] Swelling    Face and mouth swelled up   Ace  Inhibitors Swelling    Medications reviewed.    ROS Full ROS performed and is otherwise negative other than what is stated in HPI   BP 107/78   Pulse (!) 108   Temp 98.1 F (36.7 C) (Oral)   Ht 5\' 3"  (1.6 m)   Wt 161 lb (73 kg)   SpO2 95%   BMI 28.52 kg/m   Physical Exam Vitals and nursing note reviewed. Exam conducted with a chaperone present.  Constitutional:      General: She is not in acute distress.    Appearance: Normal appearance. She is obese.  Eyes:    General:        Right eye: No discharge.        Left eye: No discharge.  Cardiovascular:    Rate and Rhythm: Normal rate and regular rhythm.     Pulses: Normal pulses.     Heart sounds: No friction rub.  Pulmonary:    Effort: No respiratory distress.     Breath sounds: Normal breath sounds. No stridor.  Abdominal:     General: Abdomen is flat. There is no distension.     Palpations: Abdomen is soft. There is no mass.     Tenderness: There is no abdominal tenderness. There is no guarding or rebound.     Hernia: A hernia is present Left subcostal Area, reducible, no peritonitis  Musculoskeletal:        General: No swelling or tenderness. Normal range of motion.     Cervical back: Normal range of motion and neck supple. No rigidity or tenderness. Lymphadenopathy:    Cervical: No cervical adenopathy.  Skin:    General: Skin is warm and dry.     Capillary Refill: Capillary refill takes less than 2 seconds.  Neurological:    General: No focal deficit present.     Mental Status: She is alert and oriented to person, place, and time.  Psychiatric:        Mood and Affect: Mood normal.        Behavior: Behavior normal.        Thought Content: Thought content normal.        Judgment: Judgment normal.  No results found.  Assessment/Plan: Left subcostal area. D/W the pt in detail.  Discussed with the patient in detail I do think that the best option for her is to perform an open repair with mesh. This is given  the high likelihood for her developing a hernia or any of the incision ports.  Procedure discussed with patient in detail.  Risks, benefits and possible complications including but not limited to: Bleeding, infection, recurrence, bowel injuries.  She understands and wishes to proceed.   She will be scheduled for next week.  All questions were answered. Greater than 50% of the 30 minutes  visit was spent in counseling/coordination of care   [facility]  Everlene Farrier, MD [facility] General Surgeon

## 2020-11-23 NOTE — Patient Instructions (Signed)
If you have any concerns or questions, please feel free to call our office.    

## 2020-11-23 NOTE — Progress Notes (Signed)
Outpatient Surgical Follow Up  11/23/2020  Lisa Galvan is an 60 y.o. female.   Chief Complaint  Patient presents with   Follow-up    Pre-op to discuss hernia surgery    HPI:  60 yo female with hx of robotic recurrent ventral hernia repair by me over  1 year ago.  Now comes following up regarding a left subcostal hernia from my robotic repair. Last few months the defect has enlarged in size  She also had a recent CT scan that have personally reviewed confirming the left subcostal incisional hernia, there is also evidence of transverse colon within the hernia sac.Marland Kitchen No fevers no chills.  No nausea no vomiting.   SHe does report some intermittent pain around the hernia.  It is moderate to mild intensity worsening with Valsalva. She Does have a history of chronic back pain and she follows with the pain clinic. She comes accompanied by her daughter and granddaughter   Past Medical History:  Diagnosis Date   Allergy    Anemia    Arthritis    spine   Bilateral leg edema    Chest pain 12/15/2014   Chronic sciatica 12/10/2014   Essential hypertension 12/10/2014   Gastroesophageal reflux disease without esophagitis    Mixed hyperlipidemia    Obesity (BMI 30.0-34.9) 12/10/2014   Sleep apnea    waiting for cpap   Type 2 diabetes mellitus without complication (HCC) 12/10/2014   Umbilical hernia     Past Surgical History:  Procedure Laterality Date   ABDOMINAL HYSTERECTOMY     COLONOSCOPY WITH PROPOFOL N/A 03/30/2019   Procedure: COLONOSCOPY WITH PROPOFOL;  Surgeon: Toney Reil, MD;  Location: [facility] ENDOSCOPY;  Service: Gastroenterology;  Laterality: N/A;   ESOPHAGOGASTRODUODENOSCOPY (EGD) WITH PROPOFOL N/A 03/12/2017   Procedure: ESOPHAGOGASTRODUODENOSCOPY (EGD) WITH PROPOFOL;  Surgeon: Toney Reil, MD;  Location: [facility] ENDOSCOPY;  Service: Gastroenterology;  Laterality: N/A;   ESOPHAGOGASTRODUODENOSCOPY (EGD) WITH PROPOFOL N/A 03/30/2019   Procedure:  ESOPHAGOGASTRODUODENOSCOPY (EGD) WITH PROPOFOL;  Surgeon: Toney Reil, MD;  Location: [facility] ENDOSCOPY;  Service: Gastroenterology;  Laterality: N/A;   HERNIA REPAIR     ORIF ANKLE FRACTURE Left 07/30/2019   Procedure: OPEN REDUCTION INTERNAL FIXATION (ORIF) OF LEFT BIMALLEOLAR ANKLE FRACTURE;  Surgeon: Signa Kell, MD;  Location: [facility];  Service: Orthopedics;  Laterality: Left;  Diabetic - oral meds   UMBILICAL HERNIA REPAIR N/A 05/01/2017   Procedure: HERNIA REPAIR UMBILICAL ADULT;  Surgeon: Ancil Linsey, MD;  Location: [facility] ORS;  Service: General;  Laterality: N/A;   XI ROBOTIC ASSISTED VENTRAL HERNIA N/A 10/06/2019   Procedure: XI ROBOTIC ASSISTED VENTRAL HERNIA;  Surgeon: Leafy Ro, MD;  Location: [facility] ORS;  Service: General;  Laterality: N/A;    Family History  Problem Relation Age of Onset   Diabetes Mother    Cancer Mother        breast and stomach   Breast cancer Mother 64   Dementia Mother    Heart disease Father    Kidney disease Father        dialysis   Cancer Father        colon   Diabetes Father    Hypertension Sister    Hypertension Brother    Congestive Heart Failure Maternal Grandmother    Diabetes Maternal Grandmother    Hypertension Maternal Grandmother     Social History:  reports that she has never smoked. She has never used smokeless tobacco. She reports current alcohol use of about  3.0 standard drinks of alcohol per week. She reports that she does not use drugs.  Allergies:  Allergies  Allergen Reactions   Benazepril Swelling    Face and mouth swelling.   Ivp Dye [Iodinated Diagnostic Agents] Anaphylaxis    Patient unsure of reaction but in case of reaction d/t shellfish allergy   Lyrica [Pregabalin] Swelling    Face/ mouth swelled up balloon   Shellfish Allergy Anaphylaxis and Swelling    Betadine on skin is okay   Shellfish-Derived Products Anaphylaxis   Trulicity [Dulaglutide] Swelling    Face and mouth swelled up   Ace  Inhibitors Swelling    Medications reviewed.    ROS Full ROS performed and is otherwise negative other than what is stated in HPI   BP 107/78   Pulse (!) 108   Temp 98.1 F (36.7 C) (Oral)   Ht 5\' 3"  (1.6 m)   Wt 161 lb (73 kg)   SpO2 95%   BMI 28.52 kg/m   Physical Exam Vitals and nursing note reviewed. Exam conducted with a chaperone present.  Constitutional:      General: She is not in acute distress.    Appearance: Normal appearance. She is obese.  Eyes:    General:        Right eye: No discharge.        Left eye: No discharge.  Cardiovascular:    Rate and Rhythm: Normal rate and regular rhythm.     Pulses: Normal pulses.     Heart sounds: No friction rub.  Pulmonary:    Effort: No respiratory distress.     Breath sounds: Normal breath sounds. No stridor.  Abdominal:     General: Abdomen is flat. There is no distension.     Palpations: Abdomen is soft. There is no mass.     Tenderness: There is no abdominal tenderness. There is no guarding or rebound.     Hernia: A hernia is present Left subcostal Area, reducible, no peritonitis  Musculoskeletal:        General: No swelling or tenderness. Normal range of motion.     Cervical back: Normal range of motion and neck supple. No rigidity or tenderness. Lymphadenopathy:    Cervical: No cervical adenopathy.  Skin:    General: Skin is warm and dry.     Capillary Refill: Capillary refill takes less than 2 seconds.  Neurological:    General: No focal deficit present.     Mental Status: She is alert and oriented to person, place, and time.  Psychiatric:        Mood and Affect: Mood normal.        Behavior: Behavior normal.        Thought Content: Thought content normal.        Judgment: Judgment normal.  No results found.  Assessment/Plan: Left subcostal area. D/W the pt in detail.  Discussed with the patient in detail I do think that the best option for her is to perform an open repair with mesh. This is given  the high likelihood for her developing a hernia or any of the incision ports.  Procedure discussed with patient in detail.  Risks, benefits and possible complications including but not limited to: Bleeding, infection, recurrence, bowel injuries.  She understands and wishes to proceed.   She will be scheduled for next week.  All questions were answered. Greater than 50% of the 30 minutes  visit was spent in counseling/coordination of care   [facility]  Everlene Farrier, MD [facility] General Surgeon

## 2020-11-24 NOTE — Progress Notes (Signed)
Perioperative Services Pre-Admission/Anesthesia Testing    Date: 11/24/20  Name: Josefina Rynders MRN:   [id number]  Re: Review and clearance for surgery following EKG changes noted in PAT   Case: [id number] Date/Time: 11/29/20 0715   Procedure: HERNIA REPAIR INCISIONAL, open   Anesthesia type: General   Pre-op diagnosis: ventral hernia   Location: [facility] OR ROOM 06 / [facility]   Surgeons: Leafy Ro, MD     Patient scheduled for the above procedure on 11/29/2020 with Dr. Sterling Big, MD.  Patient initially scheduled for same procedure on 10/18/2020, however in response to significant hypokalemia and acute ECG changes, the procedure was canceled.  Patient was seen in the ED for further evaluation on 10/10/2020; notes reviewed.  Work-up for ACS was negative.  Hypokalemia was treated while in the ED, with plans for further supplementation as an outpatient.  Patient ultimately discharged home with plans to follow-up with her PCP and cardiology. K+ has been rechecked several times since her ED visit; K+ 3.4 mmol/L when last checked on 11/16/2020.  Patient was seen in follow-up consult on 10/27/2020 by her cardiologist Gwen Pounds, MD); notes reviewed.  At the time of her clinic visit, patient reporting shortness of breath, nausea, and fatigue.  She denied any episodes of chest pain, PND, orthopnea, palpitations, significant peripheral edema, vertiginous symptoms, or presyncope/syncope.  Blood pressure was well controlled at 120/70.  Patient was on a statin for HLD.  T2DM loosely controlled on currently prescribed regimen; last Hgb A1c 8.1% when checked on 09/01/2020.  Given symptoms, cardiology elected to proceed with noninvasive cardiovascular studies.  TTE performed on 11/18/2020 revealed globally normal systolic function with moderate LVH.  Estimated LVEF >55%.  There was trivial pan valvular insufficiency noted.  Myocardial perfusion imaging study performed on 11/18/2020  demonstrated hyperdynamic left ventricular systolic function with an EF of 70%.  There was no evidence of stress-induced myocardial ischemia or arrhythmia.  Study indeterminant due to baseline ECG changes (nonspecific T wave abnormalities).  Following the aforementioned noninvasive studies, cardiology issued clearance for patient to proceed with planned surgical intervention.  Per cardiology, "The patient is at the lowest risk possible for perioperative cardiovascular complications with the planned procedure.  The overall risk her procedure is low (<1%).  Currently has no evidence active and/or significant angina and/or congestive heart failure. Patient may proceed to surgery without restriction or need for further cardiovascular testing and an overall LOW risk".  Patient is on daily low-dose ASA therapy.  Cardiology recommending the patient hold her ASA for 4 days prior to her procedure with plans to resume this soon as postoperative bleeding risk felt to be minimized by her primary attending surgeon.  Patient was advised that her last dose of ASA should be on 11/24/2020.  Impression and Plan:  Hajer Dwyer has been referred for pre-anesthesia review and clearance prior to her undergoing the planned anesthetic and procedural courses. Available labs, pertinent testing, and imaging results were personally reviewed by me. This patient has been appropriately cleared by cardiology with an overall LOW risk of significant perioperative cardiovascular complications.  Based on clinical review performed today (11/24/20), barring any significant acute changes in the patient's overall condition, it is anticipated that she will be able to proceed with the planned surgical intervention. Any acute changes in clinical condition may necessitate her procedure being postponed and/or cancelled. Patient will meet with anesthesia team (MD and/or CRNA) on this day of her procedure for preoperative evaluation/assessment.    Pre-surgical  instructions were reviewed with the patient during her PAT appointment and questions were fielded by PAT clinical staff. Patient was advised that if any questions or concerns arise prior to her procedure then she should return a call to PAT and/or her surgeon's office to discuss.  Quentin Mulling, MSN, APRN, FNP-C, CEN [facility]  Peri-operative Services Nurse Practitioner Phone: [phone] 11/24/20 9:31 AM  NOTE: This note has been prepared using Dragon dictation software. Despite my best ability to proofread, there is always the potential that unintentional transcriptional errors may still occur from this process.

## 2020-11-28 ENCOUNTER — Telehealth: Payer: Self-pay

## 2020-11-28 DIAGNOSIS — R269 Unspecified abnormalities of gait and mobility: Secondary | ICD-10-CM

## 2020-11-28 DIAGNOSIS — G20 Parkinson's disease: Secondary | ICD-10-CM

## 2020-11-28 DIAGNOSIS — E1142 Type 2 diabetes mellitus with diabetic polyneuropathy: Secondary | ICD-10-CM

## 2020-11-28 NOTE — Telephone Encounter (Signed)
Spoke with patient and was notified that she still has not heard from anyone regarding her physical therapy and she said that pain is still there and it is about a pain level 8. Please advise?

## 2020-11-28 NOTE — Telephone Encounter (Signed)
Can we please check with PT company about status of her PT. We were holding on increasing her gabapentin as she was unsteady on her feet and I don't want to increase her risk of falls.

## 2020-11-28 NOTE — Telephone Encounter (Signed)
Lisa Galvan, can you look into this ASAP please?

## 2020-11-28 NOTE — Telephone Encounter (Signed)
Can we please check on the status of home PT?

## 2020-11-28 NOTE — Telephone Encounter (Signed)
Copied from CRM [id number]. Topic: General - Other >> Nov 28, 2020 10:13 AM Marylen Ponto wrote: Reason for CRM: Pt stated her pain level is at an eight and she is unable to sleep. Pt stated she has not heard from physical therapy and she needs the Rx for Gabapentin to be increased.

## 2020-11-28 NOTE — Telephone Encounter (Signed)
Thanks so much Brayton Caves- I appreciate it!

## 2020-11-28 NOTE — Telephone Encounter (Signed)
Absolutely. Please let her know that her insurance is being funny about the home PT so I put in a referral for outpatient PT- If you tell me where she's being sent Brayton Caves, I'll have her call to schedule

## 2020-11-29 ENCOUNTER — Telehealth: Payer: BLUE CROSS/BLUE SHIELD

## 2020-11-29 ENCOUNTER — Ambulatory Visit: Payer: BC Managed Care – PPO | Admitting: Urgent Care

## 2020-11-29 ENCOUNTER — Other Ambulatory Visit: Payer: Self-pay

## 2020-11-29 ENCOUNTER — Encounter: Payer: Self-pay | Admitting: Surgery

## 2020-11-29 ENCOUNTER — Encounter
Admission: RE | Disposition: A | Discharge status: Home / Self Care | Payer: Self-pay | Source: Home / Self Care | Attending: Surgery

## 2020-11-29 ENCOUNTER — Ambulatory Visit
Admission: RE | Admit: 2020-11-29 | Discharge: 2020-11-29 | Disposition: A | Discharge status: Home / Self Care | Payer: BC Managed Care – PPO | Attending: Surgery | Admitting: Surgery

## 2020-11-29 DIAGNOSIS — K432 Incisional hernia without obstruction or gangrene: Secondary | ICD-10-CM | POA: Insufficient documentation

## 2020-11-29 DIAGNOSIS — G8929 Other chronic pain: Secondary | ICD-10-CM | POA: Diagnosis not present

## 2020-11-29 DIAGNOSIS — K439 Ventral hernia without obstruction or gangrene: Secondary | ICD-10-CM | POA: Diagnosis not present

## 2020-11-29 DIAGNOSIS — Z91041 Radiographic dye allergy status: Secondary | ICD-10-CM | POA: Insufficient documentation

## 2020-11-29 DIAGNOSIS — Z803 Family history of malignant neoplasm of breast: Secondary | ICD-10-CM | POA: Diagnosis not present

## 2020-11-29 DIAGNOSIS — Z8249 Family history of ischemic heart disease and other diseases of the circulatory system: Secondary | ICD-10-CM | POA: Diagnosis not present

## 2020-11-29 DIAGNOSIS — Z833 Family history of diabetes mellitus: Secondary | ICD-10-CM | POA: Insufficient documentation

## 2020-11-29 DIAGNOSIS — Z888 Allergy status to other drugs, medicaments and biological substances status: Secondary | ICD-10-CM | POA: Insufficient documentation

## 2020-11-29 DIAGNOSIS — Z91013 Allergy to seafood: Secondary | ICD-10-CM | POA: Insufficient documentation

## 2020-11-29 DIAGNOSIS — E119 Type 2 diabetes mellitus without complications: Secondary | ICD-10-CM | POA: Diagnosis not present

## 2020-11-29 DIAGNOSIS — Z800 Family history of malignant neoplasm of digestive organs: Secondary | ICD-10-CM | POA: Diagnosis not present

## 2020-11-29 DIAGNOSIS — M549 Dorsalgia, unspecified: Secondary | ICD-10-CM | POA: Insufficient documentation

## 2020-11-29 HISTORY — PX: INCISIONAL HERNIA REPAIR: SHX193

## 2020-11-29 LAB — POCT I-STAT, CHEM 8
BUN: 4 mg/dL — ABNORMAL LOW (ref 8–23)
Calcium, Ion: 1.22 mmol/L (ref 1.15–1.40)
Chloride: 103 mmol/L (ref 98–111)
Creatinine, Ser: 0.7 mg/dL (ref 0.44–1.00)
Glucose, Bld: 97 mg/dL (ref 70–99)
HCT: 39 % (ref 36.0–46.0)
Hemoglobin: 13.3 g/dL (ref 12.0–15.0)
Potassium: 3.4 mmol/L — ABNORMAL LOW (ref 3.5–5.1)
Sodium: 139 mmol/L (ref 135–145)
TCO2: 24 mmol/L (ref 22–32)

## 2020-11-29 LAB — GLUCOSE, CAPILLARY: Glucose-Capillary: 137 mg/dL — ABNORMAL HIGH (ref 70–99)

## 2020-11-29 SURGERY — REPAIR, HERNIA, INCISIONAL
Anesthesia: General

## 2020-11-29 MED ORDER — CELECOXIB 200 MG PO CAPS: 200.0000 mg | ORAL_CAPSULE | ORAL | Status: AC

## 2020-11-29 MED ORDER — ACETAMINOPHEN 500 MG PO TABS: 1000.0000 mg | ORAL_TABLET | ORAL | Status: AC

## 2020-11-29 MED ORDER — ROCURONIUM BROMIDE 100 MG/10ML IV SOLN
INTRAVENOUS | Status: DC | PRN
  Administered 2020-11-29: 50 mg via INTRAVENOUS

## 2020-11-29 MED ORDER — CHLORHEXIDINE GLUCONATE 0.12 % MT SOLN
OROMUCOSAL | Status: AC
  Administered 2020-11-29: 15 mL via OROMUCOSAL
  Filled 2020-11-29: qty 15

## 2020-11-29 MED ORDER — PROPOFOL 10 MG/ML IV BOLUS
INTRAVENOUS | Status: AC
  Filled 2020-11-29: qty 40

## 2020-11-29 MED ORDER — SODIUM CHLORIDE 0.9 % IV SOLN
INTRAVENOUS | Status: DC
Start: 2020-11-29 — End: 2020-11-29

## 2020-11-29 MED ORDER — FENTANYL CITRATE (PF) 250 MCG/5ML IJ SOLN
INTRAMUSCULAR | Status: AC
  Filled 2020-11-29: qty 5

## 2020-11-29 MED ORDER — BUPIVACAINE-EPINEPHRINE (PF) 0.25% -1:200000 IJ SOLN
INTRAMUSCULAR | Status: AC
  Filled 2020-11-29: qty 30

## 2020-11-29 MED ORDER — CELECOXIB 200 MG PO CAPS
ORAL_CAPSULE | ORAL | Status: AC
  Administered 2020-11-29: 200 mg via ORAL
  Filled 2020-11-29: qty 1

## 2020-11-29 MED ORDER — FENTANYL CITRATE (PF) 100 MCG/2ML IJ SOLN
INTRAMUSCULAR | Status: AC
  Filled 2020-11-29: qty 2

## 2020-11-29 MED ORDER — ACETAMINOPHEN 500 MG PO TABS
ORAL_TABLET | ORAL | Status: AC
  Administered 2020-11-29: 1000 mg via ORAL
  Filled 2020-11-29: qty 2

## 2020-11-29 MED ORDER — GABAPENTIN 300 MG PO CAPS: 300.0000 mg | ORAL_CAPSULE | ORAL | Status: DC

## 2020-11-29 MED ORDER — OXYCODONE HCL 5 MG PO TABS
5.0000 mg | ORAL_TABLET | Freq: Once | ORAL | Status: AC | PRN
  Administered 2020-11-29: 5 mg via ORAL

## 2020-11-29 MED ORDER — BUPIVACAINE LIPOSOME 1.3 % IJ SUSP
INTRAMUSCULAR | Status: AC
  Filled 2020-11-29: qty 20

## 2020-11-29 MED ORDER — CEFAZOLIN SODIUM-DEXTROSE 2-4 GM/100ML-% IV SOLN
INTRAVENOUS | Status: AC
  Filled 2020-11-29: qty 100

## 2020-11-29 MED ORDER — CHLORHEXIDINE GLUCONATE CLOTH 2 % EX PADS: 6.0000 | MEDICATED_PAD | Freq: Once | CUTANEOUS | Status: DC

## 2020-11-29 MED ORDER — PHENYLEPHRINE HCL (PRESSORS) 10 MG/ML IV SOLN
INTRAVENOUS | Status: AC
  Filled 2020-11-29: qty 1

## 2020-11-29 MED ORDER — FENTANYL CITRATE (PF) 100 MCG/2ML IJ SOLN
25.0000 ug | INTRAMUSCULAR | Status: DC | PRN
  Administered 2020-11-29 (×2): 25 ug via INTRAVENOUS

## 2020-11-29 MED ORDER — LACTATED RINGERS IV SOLN: INTRAVENOUS | Status: DC | PRN

## 2020-11-29 MED ORDER — OXYCODONE HCL 5 MG/5ML PO SOLN
5.0000 mg | Freq: Once | ORAL | Status: AC | PRN
Start: 2020-11-29 — End: 2020-11-29

## 2020-11-29 MED ORDER — ONDANSETRON HCL 4 MG/2ML IJ SOLN
INTRAMUSCULAR | Status: DC | PRN
  Administered 2020-11-29 (×2): 4 mg via INTRAVENOUS

## 2020-11-29 MED ORDER — SODIUM CHLORIDE 0.9 % IV SOLN
INTRAVENOUS | Status: DC | PRN
  Administered 2020-11-29: 50 ug/min via INTRAVENOUS

## 2020-11-29 MED ORDER — OXYCODONE-ACETAMINOPHEN 5-325 MG PO TABS: 1.0000 | ORAL_TABLET | ORAL | 0 refills | Status: DC | PRN

## 2020-11-29 MED ORDER — SUGAMMADEX SODIUM 200 MG/2ML IV SOLN
INTRAVENOUS | Status: DC | PRN
  Administered 2020-11-29: 200 mg via INTRAVENOUS

## 2020-11-29 MED ORDER — ONDANSETRON HCL 4 MG/2ML IJ SOLN: 4.0000 mg | Freq: Once | INTRAMUSCULAR | Status: DC | PRN

## 2020-11-29 MED ORDER — ORAL CARE MOUTH RINSE: 15.0000 mL | Freq: Once | OROMUCOSAL | Status: AC

## 2020-11-29 MED ORDER — CHLORHEXIDINE GLUCONATE 0.12 % MT SOLN: 15.0000 mL | Freq: Once | OROMUCOSAL | Status: AC

## 2020-11-29 MED ORDER — BUPIVACAINE-EPINEPHRINE (PF) 0.25% -1:200000 IJ SOLN
INTRAMUSCULAR | Status: DC | PRN
  Administered 2020-11-29: 50 mL

## 2020-11-29 MED ORDER — CEFAZOLIN SODIUM-DEXTROSE 2-4 GM/100ML-% IV SOLN
2.0000 g | INTRAVENOUS | Status: AC
  Administered 2020-11-29: 2 g via INTRAVENOUS

## 2020-11-29 MED ORDER — LIDOCAINE HCL (CARDIAC) PF 100 MG/5ML IV SOSY
PREFILLED_SYRINGE | INTRAVENOUS | Status: DC | PRN
  Administered 2020-11-29: 100 mg via INTRAVENOUS

## 2020-11-29 MED ORDER — PROPOFOL 10 MG/ML IV BOLUS
INTRAVENOUS | Status: DC | PRN
  Administered 2020-11-29: 120 mg via INTRAVENOUS

## 2020-11-29 MED ORDER — FENTANYL CITRATE (PF) 100 MCG/2ML IJ SOLN
INTRAMUSCULAR | Status: DC | PRN
  Administered 2020-11-29: 100 ug via INTRAVENOUS

## 2020-11-29 MED ORDER — ONDANSETRON HCL 4 MG/2ML IJ SOLN
INTRAMUSCULAR | Status: AC
  Filled 2020-11-29: qty 2

## 2020-11-29 MED ORDER — GABAPENTIN 300 MG PO CAPS
ORAL_CAPSULE | ORAL | Status: AC
  Filled 2020-11-29: qty 1

## 2020-11-29 MED ORDER — OXYCODONE HCL 5 MG PO TABS
ORAL_TABLET | ORAL | Status: AC
  Filled 2020-11-29: qty 1

## 2020-11-29 MED ORDER — PHENYLEPHRINE HCL (PRESSORS) 10 MG/ML IV SOLN
INTRAVENOUS | Status: DC | PRN
  Administered 2020-11-29 (×7): 100 ug via INTRAVENOUS

## 2020-11-29 SURGICAL SUPPLY — 41 items
ADH SKN CLS APL DERMABOND .7 (GAUZE/BANDAGES/DRESSINGS) ×1
APL PRP STRL LF DISP 70% ISPRP (MISCELLANEOUS) ×1
APPLIER CLIP 11 MED OPEN (CLIP)
APPLIER CLIP 13 LRG OPEN (CLIP) ×2
APR CLP LRG 13 20 CLIP (CLIP) ×1
APR CLP MED 11 20 MLT OPN (CLIP)
BLADE CLIPPER SURG (BLADE) IMPLANT
CHLORAPREP W/TINT 26 (MISCELLANEOUS) ×2 IMPLANT
CLIP APPLIE 11 MED OPEN (CLIP) IMPLANT
CLIP APPLIE 13 LRG OPEN (CLIP) ×1 IMPLANT
DERMABOND ADVANCED (GAUZE/BANDAGES/DRESSINGS) ×1
DERMABOND ADVANCED .7 DNX12 (GAUZE/BANDAGES/DRESSINGS) ×1 IMPLANT
DEVICE SECURE STRAP 25 ABSORB (INSTRUMENTS) ×2 IMPLANT
DRAPE LAPAROTOMY 100X77 ABD (DRAPES) ×2 IMPLANT
DRSG TELFA 3X8 NADH (GAUZE/BANDAGES/DRESSINGS) ×2 IMPLANT
ELECT CAUTERY BLADE 6.4 (BLADE) ×2 IMPLANT
ELECT REM PT RETURN 9FT ADLT (ELECTROSURGICAL) ×2
ELECTRODE REM PT RTRN 9FT ADLT (ELECTROSURGICAL) ×1 IMPLANT
GAUZE 4X4 16PLY ~~LOC~~+RFID DBL (SPONGE) IMPLANT
GAUZE SPONGE 4X4 12PLY STRL (GAUZE/BANDAGES/DRESSINGS) ×2 IMPLANT
GLOVE SURG ENC MOIS LTX SZ7 (GLOVE) ×16 IMPLANT
GOWN STRL REUS W/ TWL LRG LVL3 (GOWN DISPOSABLE) ×2 IMPLANT
GOWN STRL REUS W/TWL LRG LVL3 (GOWN DISPOSABLE) ×4
MANIFOLD NEPTUNE II (INSTRUMENTS) ×2 IMPLANT
MESH VENT ST 8.12CM S OVL (Mesh General) ×2 IMPLANT
NEEDLE HYPO 22GX1.5 SAFETY (NEEDLE) ×2 IMPLANT
NEEDLE HYPO 25X1 1.5 SAFETY (NEEDLE) ×2 IMPLANT
PACK BASIN MINOR ARMC (MISCELLANEOUS) ×2 IMPLANT
SPONGE T-LAP 18X18 ~~LOC~~+RFID (SPONGE) ×4 IMPLANT
STAPLER SKIN PROX 35W (STAPLE) ×2 IMPLANT
SUT ETHIBOND 0 MO6 C/R (SUTURE) ×6 IMPLANT
SUT MNCRL 4-0 (SUTURE) ×2
SUT MNCRL 4-0 27XMFL (SUTURE) ×1
SUT VIC AB 0 CT1 36 (SUTURE) ×2 IMPLANT
SUT VIC AB 2-0 SH 27 (SUTURE) ×4
SUT VIC AB 2-0 SH 27XBRD (SUTURE) ×2 IMPLANT
SUT VIC AB 4-0 FS2 27 (SUTURE) ×2 IMPLANT
SUTURE MNCRL 4-0 27XMF (SUTURE) ×1 IMPLANT
SYR 20ML LL LF (SYRINGE) ×2 IMPLANT
TAPE MICROFOAM 4IN (TAPE) ×2 IMPLANT
WATER STERILE IRR 1000ML POUR (IV SOLUTION) ×2 IMPLANT

## 2020-11-29 NOTE — Anesthesia Preprocedure Evaluation (Addendum)
Anesthesia Evaluation  Patient identified by MRN, date of birth, ID band Patient awake    Reviewed: Allergy & Precautions, NPO status , Patient's Chart, lab work & pertinent test results  History of Anesthesia Complications Negative for: history of anesthetic complications  Airway Mallampati: III  TM Distance: >3 FB Neck ROM: Full    Dental no notable dental hx. (+) Teeth Intact   Pulmonary sleep apnea , neg COPD, Patient abstained from smoking.Not current smoker,  Not on cpap yet (backordered)   Pulmonary exam normal breath sounds clear to auscultation       Cardiovascular Exercise Tolerance: Good METShypertension, Pt. on medications (-) CAD and (-) Past MI (-) dysrhythmias  Rhythm:Regular Rate:Normal - Systolic murmurs ? TTE performed on 11/18/2020 revealed globally normal systolic function with moderate LVH.  Estimated LVEF >55%.  There was trivial pan valvular insufficiency noted.  ? Myocardial perfusion imaging study performed on 11/18/2020 demonstrated hyperdynamic left ventricular systolic function with an EF of 70%.  There was no evidence of stress-induced myocardial ischemia or arrhythmia.  Study indeterminant due to baseline ECG changes (nonspecific T wave abnormalities).    Neuro/Psych  Neuromuscular disease negative psych ROS   GI/Hepatic GERD  Medicated and Controlled,(+)     (-) substance abuse  ,   Endo/Other  diabetes  Renal/GU negative Renal ROS     Musculoskeletal   Abdominal   Peds  Hematology   Anesthesia Other Findings Past Medical History: No date: Allergy No date: Anemia No date: Arthritis     Comment:  spine No date: Bilateral leg edema 12/15/2014: Chest pain 12/10/2014: Chronic sciatica 12/10/2014: Essential hypertension No date: Gastroesophageal reflux disease without esophagitis No date: Mixed hyperlipidemia 12/10/2014: Obesity (BMI 30.0-34.9) No date: Sleep apnea     Comment:   waiting for cpap 12/10/2014: Type 2 diabetes mellitus without complication (HCC) No date: Umbilical hernia  Reproductive/Obstetrics                            Anesthesia Physical Anesthesia Plan  ASA: 2  Anesthesia Plan: General   Post-op Pain Management:    Induction: Intravenous  PONV Risk Score and Plan: 4 or greater and Ondansetron, Dexamethasone and Midazolam  Airway Management Planned: Oral ETT  Additional Equipment: None  Intra-op Plan:   Post-operative Plan: Extubation in OR  Informed Consent: I have reviewed the patients History and Physical, chart, labs and discussed the procedure including the risks, benefits and alternatives for the proposed anesthesia with the patient or authorized representative who has indicated his/her understanding and acceptance.     Dental advisory given  Plan Discussed with: CRNA and Surgeon  Anesthesia Plan Comments: (Discussed risks of anesthesia with patient, including PONV, sore throat, lip/dental damage. Rare risks discussed as well, such as cardiorespiratory and neurological sequelae, and allergic reactions. Patient understands.)        Anesthesia Quick Evaluation

## 2020-11-29 NOTE — Anesthesia Procedure Notes (Signed)
Procedure Name: Intubation Date/Time: 11/29/2020 7:42 AM Performed by: Gilford Raid, CRNA Pre-anesthesia Checklist: Patient identified, Patient being monitored, Timeout performed, Emergency Drugs available and Suction available Patient Re-evaluated:Patient Re-evaluated prior to induction Oxygen Delivery Method: Circle system utilized Preoxygenation: Pre-oxygenation with 100% oxygen Induction Type: IV induction Ventilation: Mask ventilation without difficulty Laryngoscope Size: Miller and 2 Grade View: Grade I Tube type: Oral Tube size: 7.0 mm Number of attempts: 1 Placement Confirmation: ETT inserted through vocal cords under direct vision, positive ETCO2 and breath sounds checked- equal and bilateral Secured at: 21 cm Tube secured with: Tape Dental Injury: Teeth and Oropharynx as per pre-operative assessment

## 2020-11-29 NOTE — Anesthesia Postprocedure Evaluation (Signed)
Anesthesia Post Note  Patient: Lisa Galvan  Procedure(s) Performed: HERNIA REPAIR INCISIONAL, open  Patient location during evaluation: PACU Anesthesia Type: General Level of consciousness: awake and alert Pain management: pain level controlled Vital Signs Assessment: post-procedure vital signs reviewed and stable Respiratory status: spontaneous breathing, nonlabored ventilation, respiratory function stable and patient connected to nasal cannula oxygen Cardiovascular status: blood pressure returned to baseline and stable Postop Assessment: no apparent nausea or vomiting Anesthetic complications: no   No notable events documented.   Last Vitals:  Vitals:   11/29/20 1118 11/29/20 1141  BP: 113/82 93/67  Pulse:    Resp: 16 16  Temp: (!) 36.2 C   SpO2: 99% 99%    Last Pain:  Vitals:   11/29/20 1141  TempSrc:   PainSc: 5                  Lenard Simmer

## 2020-11-29 NOTE — Discharge Instructions (Addendum)
AMBULATORY SURGERY  DISCHARGE INSTRUCTIONS   The drugs that you were given will stay in your system until tomorrow so for the next 24 hours you should not:  Drive an automobile Make any legal decisions Drink any alcoholic beverage   You may resume regular meals tomorrow.  Today it is better to start with liquids and gradually work up to solid foods.  You may eat anything you prefer, but it is better to start with liquids, then soup and crackers, and gradually work up to solid foods.   Please notify your doctor immediately if you have any unusual bleeding, trouble breathing, redness and pain at the surgery site, drainage, fever, or pain not relieved by medication.    Additional Instructions:  Keep green arm band on for 4 days  Please contact your physician with any problems or Same Day Surgery at [phone], Monday through Friday 6 am to 4 pm, or [place] at  Main number at [phone].  

## 2020-11-29 NOTE — Op Note (Signed)
Incisional Hernia Repair with Mesh 12 x 8 cms Ventrio ST  Pre-operative Diagnosis: Incisional hernia  Post-operative Diagnosis: same  Surgeon: Sterling Big, MD FACS  Anesthesia: Gen. with endotracheal tube   Findings: 4.5 cm Left subcostal incisional hernia   Estimated Blood Loss: 20cc                 Specimens: sac          Complications: none              Procedure Details  The patient was seen again in the Holding Room. The benefits, complications, treatment options, and expected outcomes were discussed with the patient. The risks of bleeding, infection, recurrence of symptoms, failure to resolve symptoms, bowel injury, mesh placement, mesh infection, any of which could require further surgery were reviewed with the patient. The likelihood of improving the patient's symptoms with return to their baseline status is good.  The patient and/or family concurred with the proposed plan, giving informed consent.  The patient was taken to Operating Room, identified and the procedure verified.  A Time Out was held and the above information confirmed.  Prior to the induction of general anesthesia, antibiotic prophylaxis was administered. VTE prophylaxis was in place. General endotracheal anesthesia was then administered and tolerated well. After the induction, the abdomen was prepped with Chloraprep and draped in the sterile fashion. The patient was positioned in the supine position.   Incision was created with a scalpel over the hernia defect. Electrocautery was used to dissect through subcutaneous tissue, the hernia sac was opened excised. Omentum was attached to the hernia sac and LOA performed with scissors. No injuries were observed. Liposomal marcaine was used to perform a full thickness abdominal wall under direct visualization, I also did a couple of intercostal nerve block was infiltrating the periosteum of the left chest wall.    The hernia was measured. Ventrio ST 12x 8 cms mesh was  selected and secure to the fascia using 6 interrupted transfacial sutures. I used Secure strap to attached the mesh to the abdominal wall circumferentially, I was very happy with the underlay position of the mesh. I closed the hernia defect with interrupted 0 Ethibond sutures.   Incision was closed in a 2 layer fashion with 3-0 Vicryl and 4-0 Monocryl. Dermabond was used to coat the skin.  Patient tolerated procedure well and there were no immediate complications. Needle and laparotomy counts were correct

## 2020-11-29 NOTE — Interval H&P Note (Signed)
History and Physical Interval Note:  11/29/2020 7:24 AM  Lisa Galvan  has presented today for surgery, with the diagnosis of ventral hernia.  The various methods of treatment have been discussed with the patient and family. After consideration of risks, benefits and other options for treatment, the patient has consented to  Procedure(s): HERNIA REPAIR INCISIONAL, open (N/A) as a surgical intervention.  The patient's history has been reviewed, patient examined, no change in status, stable for surgery.  I have reviewed the patient's chart and labs.  Questions were answered to the patient's satisfaction.     Selden Noteboom F Eyva Califano

## 2020-11-29 NOTE — Transfer of Care (Signed)
Immediate Anesthesia Transfer of Care Note  Patient: Lisa Galvan  Procedure(s) Performed: HERNIA REPAIR INCISIONAL, open  Patient Location: PACU  Anesthesia Type:General  Level of Consciousness: awake, alert  and oriented  Airway & Oxygen Therapy: Patient Spontanous Breathing and Patient connected to face mask oxygen  Post-op Assessment: Report given to RN and Post -op Vital signs reviewed and stable  Post vital signs: Reviewed and stable  Last Vitals:  Vitals Value Taken Time  BP 109/88 11/29/20 1009  Temp    Pulse    Resp 12 11/29/20 1013  SpO2    Vitals shown include unvalidated device data.  Last Pain:  Vitals:   11/29/20 0640  TempSrc: Temporal  PainSc: 0-No pain      Patients Stated Pain Goal: 0 (11/29/20 0640)  Complications: No notable events documented.

## 2020-12-02 ENCOUNTER — Other Ambulatory Visit: Payer: Self-pay

## 2020-12-02 ENCOUNTER — Encounter: Payer: Self-pay | Admitting: Family Medicine

## 2020-12-02 ENCOUNTER — Ambulatory Visit (INDEPENDENT_AMBULATORY_CARE_PROVIDER_SITE_OTHER): Payer: BC Managed Care – PPO | Admitting: Family Medicine

## 2020-12-02 VITALS — BP 120/88 | HR 101 | Temp 97.8°F | Wt 163.2 lb

## 2020-12-02 DIAGNOSIS — E785 Hyperlipidemia, unspecified: Secondary | ICD-10-CM | POA: Diagnosis not present

## 2020-12-02 DIAGNOSIS — E1142 Type 2 diabetes mellitus with diabetic polyneuropathy: Secondary | ICD-10-CM

## 2020-12-02 LAB — BAYER DCA HB A1C WAIVED: HB A1C (BAYER DCA - WAIVED): 6.7 % (ref <7.0)

## 2020-12-02 MED ORDER — MONTELUKAST SODIUM 10 MG PO TABS: 10.0000 mg | ORAL_TABLET | Freq: Every day | ORAL | 1 refills | Status: DC

## 2020-12-02 MED ORDER — ATORVASTATIN CALCIUM 20 MG PO TABS: 20.0000 mg | ORAL_TABLET | Freq: Every day | ORAL | 1 refills | Status: DC

## 2020-12-02 MED ORDER — GABAPENTIN 300 MG PO CAPS: 300.0000 mg | ORAL_CAPSULE | Freq: Three times a day (TID) | ORAL | 2 refills | Status: DC

## 2020-12-02 NOTE — Assessment & Plan Note (Signed)
Doing well with A1c of 6.7- will continue current regimen. Continue to monitor. Refills given. Will increase her gabapentin to 300mg  TID. Recheck 3 months. Call with any concerns.

## 2020-12-02 NOTE — Progress Notes (Signed)
BP 120/88   Pulse (!) 101   Temp 97.8 F (36.6 C) (Oral)   Wt 163 lb 3.2 oz (74 kg)   SpO2 98%   BMI 28.91 kg/m    Subjective:    Patient ID: Lisa Galvan, female    DOB: [birth year], 60 y.o.   MRN: [id number]  HPI: Lisa Galvan is a 60 y.o. female  Chief Complaint  Patient presents with   Diabetes   Has not been getting since since her hernia surgery. She is scheduled to see GI on 8/16. Keeping weight on. Feeling better.   DIABETES Hypoglycemic episodes:no Polydipsia/polyuria: no Visual disturbance: no Chest pain: no Paresthesias: yes Glucose Monitoring: no  Accucheck frequency: Not Checking Taking Insulin?: no Blood Pressure Monitoring: not checking Retinal Examination: Not up to Date Foot Exam: Up to Date Diabetic Education: Completed Pneumovax: Up to Date Influenza: Up to Date Aspirin: no  Relevant past medical, surgical, family and social history reviewed and updated as indicated. Interim medical history since our last visit reviewed. Allergies and medications reviewed and updated.  Review of Systems  Constitutional: Negative.   Respiratory: Negative.    Cardiovascular: Negative.   Gastrointestinal: Negative.   Genitourinary: Negative.   Musculoskeletal: Negative.   Neurological: Negative.   Psychiatric/Behavioral: Negative.     Per HPI unless specifically indicated above     Objective:    BP 120/88   Pulse (!) 101   Temp 97.8 F (36.6 C) (Oral)   Wt 163 lb 3.2 oz (74 kg)   SpO2 98%   BMI 28.91 kg/m   Wt Readings from Last 3 Encounters:  12/02/20 163 lb 3.2 oz (74 kg)  11/29/20 161 lb (73 kg)  11/23/20 161 lb (73 kg)    Physical Exam Vitals and nursing note reviewed.  Constitutional:      General: She is not in acute distress.    Appearance: Normal appearance. She is not ill-appearing, toxic-appearing or diaphoretic.  HENT:     Head: Normocephalic and atraumatic.     Right Ear: External ear normal.     Left Ear: External ear normal.      Nose: Nose normal.     Mouth/Throat:     Mouth: Mucous membranes are moist.     Pharynx: Oropharynx is clear.  Eyes:     General: No scleral icterus.       Right eye: No discharge.        Left eye: No discharge.     Extraocular Movements: Extraocular movements intact.     Conjunctiva/sclera: Conjunctivae normal.     Pupils: Pupils are equal, round, and reactive to light.  Cardiovascular:     Rate and Rhythm: Normal rate and regular rhythm.     Pulses: Normal pulses.     Heart sounds: Normal heart sounds. No murmur heard.   No friction rub. No gallop.  Pulmonary:     Effort: Pulmonary effort is normal. No respiratory distress.     Breath sounds: Normal breath sounds. No stridor. No wheezing, rhonchi or rales.  Chest:     Chest wall: No tenderness.  Musculoskeletal:        General: Normal range of motion.     Cervical back: Normal range of motion and neck supple.  Skin:    General: Skin is warm and dry.     Capillary Refill: Capillary refill takes less than 2 seconds.     Coloration: Skin is not jaundiced or pale.     Findings:  No bruising, erythema, lesion or rash.  Neurological:     General: No focal deficit present.     Mental Status: She is alert and oriented to person, place, and time. Mental status is at baseline.  Psychiatric:        Mood and Affect: Mood normal.        Behavior: Behavior normal.        Thought Content: Thought content normal.        Judgment: Judgment normal.    Results for orders placed or performed during the hospital encounter of 11/29/20  Glucose, capillary  Result Value Ref Range   Glucose-Capillary 137 (H) 70 - 99 mg/dL  I-STAT, chem 8  Result Value Ref Range   Sodium 139 135 - 145 mmol/L   Potassium 3.4 (L) 3.5 - 5.1 mmol/L   Chloride 103 98 - 111 mmol/L   BUN 4 (L) 8 - 23 mg/dL   Creatinine, Ser 8.24 0.44 - 1.00 mg/dL   Glucose, Bld 97 70 - 99 mg/dL   Calcium, Ion 2.35 3.61 - 1.40 mmol/L   TCO2 24 22 - 32 mmol/L   Hemoglobin  13.3 12.0 - 15.0 g/dL   HCT 44.3 15.4 - 00.8 %      Assessment & Plan:   Problem List Items Addressed This Visit       Endocrine   Diabetic peripheral neuropathy (HCC) - Primary (Chronic)    Doing well with A1c of 6.7- will continue current regimen. Continue to monitor. Refills given. Will increase her gabapentin to 300mg  TID. Recheck 3 months. Call with any concerns.       Relevant Medications   gabapentin (NEURONTIN) 300 MG capsule   atorvastatin (LIPITOR) 20 MG tablet   Other Relevant Orders   Bayer DCA Hb A1c Waived     Other   Hyperlipidemia   Relevant Medications   atorvastatin (LIPITOR) 20 MG tablet     Follow up plan: Return in about 3 months (around 03/04/2021) for physical.

## 2020-12-06 ENCOUNTER — Telehealth: Payer: Self-pay | Admitting: General Practice

## 2020-12-06 ENCOUNTER — Other Ambulatory Visit: Payer: Self-pay | Admitting: Family Medicine

## 2020-12-06 ENCOUNTER — Other Ambulatory Visit: Payer: Self-pay

## 2020-12-06 ENCOUNTER — Encounter: Payer: Self-pay | Admitting: Gastroenterology

## 2020-12-06 ENCOUNTER — Ambulatory Visit: Payer: BC Managed Care – PPO | Admitting: Gastroenterology

## 2020-12-06 VITALS — BP 129/88 | HR 93 | Temp 98.1°F | Ht 63.0 in | Wt 162.0 lb

## 2020-12-06 DIAGNOSIS — K8689 Other specified diseases of pancreas: Secondary | ICD-10-CM

## 2020-12-06 DIAGNOSIS — R634 Abnormal weight loss: Secondary | ICD-10-CM

## 2020-12-06 DIAGNOSIS — K529 Noninfective gastroenteritis and colitis, unspecified: Secondary | ICD-10-CM

## 2020-12-06 DIAGNOSIS — R2681 Unsteadiness on feet: Secondary | ICD-10-CM | POA: Diagnosis not present

## 2020-12-06 NOTE — Progress Notes (Signed)
Lisa Repress, MD [address]  [address]  [place], [place] [address]  Main: [phone]  Fax: [phone]    Gastroenterology Consultation  Referring Provider:     Dorcas Carrow, DO Primary Care Physician:  Dorcas Carrow, DO Primary Gastroenterologist:  Dr. Arlyss Galvan  Reason for Consultation:     Weight loss, abdominal bloating, intermittent diarrhea        HPI:   Lisa Galvan is a 60 y.o. female referred for consultation & management  by Dr. Laural Benes, Megan P, DO.  She has several years of heartburn, that has worsened over one year, describes it as pain in the chest radiating to right side as well as throat pain, worse laying flat. The pain is worse after eating or drinking something, denies any radiation to the back. She has previously tried Tums, Pepcid, she has been on Protonix for about 4 years was taking at 9 AM and 9 PM not associated with timing of food. She does not find protonix to be effective anymore. She was taking Nexium prior to protonix but changed when it became over-the-counter medication. She reports having sore taste in mouth, belching, feels like something is constantly moving down her throat. She had recent exacerbation and went to the emergency room last month. She has gained a few pounds in last few months. She does not drink sodas, no alcohol no smoking. She can tolerate tea, wine.  She goes to gym 3 times a week but she does consume high carb diet, likes to eat a lot of cheese.  Follow-up visit 05/13/2017: She underwent umbilical hernia repair and is doing well. Since last visit, patient underwent upper endoscopy which revealed H. pylori infection, treated with triple therapy. She denies having reflux symptoms on omeprazole 40 mg daily.  Follow up visit 08/05/17 H Pylori is confirmed eradicated based on H Pylori breath test. She is currently on protonix 40mg  BID for GERD and symptoms under control. She denies any symptoms today.   Follow-up  visit 02/10/2019 She reports for about 1 and half months, she was experiencing severe nausea, reflux, difficulty swallowing, pain during swallowing and nonbloody diarrhea.  And she lost about 20 pounds during that time due to poor p.o. intake from above symptoms.  She is treated for oral thrush.  Her PCP started her on sucralfate, which she is not tolerating it well as they are very big.  She could not afford Carafate suspension.  She is currently taking Protonix 40 mg twice daily.  She also reports having experienced 4-5 episodes of nonbloody watery diarrhea for which she was recommended to take Imodium.  This has resulted in constipation.  She reports abdominal bloating.  She reports that her symptoms of diarrhea nausea resolved about a week ago and picked up 4 pounds since then.  She is trying to eat regular food, but continues to feel like food not going down the esophagus, eating small portions only at this time.  She reports that her her diarrhea has resolved.  Patient did not undergo any stool studies at that time.  Hemoglobin A1c 6.4, TSH normal.  Most recent labs from 09/2018 revealed normal CBC, CMP  Follow-up visit 12/06/2020 Patient is here to discuss about weight loss associated with abdominal bloating, intermittent nonbloody diarrhea and vomiting.  Patient recently underwent incision hernia repair by Dr. 12/08/2020 on 11/29/2020.  She had prior history of ventral hernia repair in 2021.   Patient is also found to have  pancreatic atrophy with fatty pancreas.  She does report worsening of abdominal bloating after a fatty meal.  She reports that her vomiting has subsided since hernia repair.  She feels significantly bloated.  She also reports having gait issues  Denies significant stress in her life She does not smoke or drink alcohol  GI Procedures: [place] ~2011, reportedly normal  EGD 03/12/17: - Normal duodenal bulb and second portion of the duodenum. - Normal stomach.  Biopsied. - Normal gastroesophageal junction and esophagus. DIAGNOSIS:  A. STOMACH; RANDOM COLD BIOPSY:  - HELICOBACTER PYLORI-ASSOCIATED GASTRITIS, WITH MILD CHRONIC ACTIVE  INFLAMMATION.  - NEGATIVE FOR INTESTINAL METAPLASIA, ATROPHY, DYSPLASIA, AND  MALIGNANCY.  - H. PYLORI BACTERIA ARE SEEN IN HEMATOXYLIN AND EOSIN SECTIONS.  Upper endoscopy 03/30/2019 - Normal duodenal bulb and second portion of the duodenum. - Erythematous mucosa in the antrum. Biopsied. - Normal cardia, gastric fundus, gastric body, incisura and prepyloric region of the stomach. Biopsied. - Esophagogastric landmarks identified. - Normal gastroesophageal junction and esophagus.  DIAGNOSIS:  A.  STOMACH; COLD BIOPSY:  - ANTRAL AND OXYNTIC MUCOSA WITH MILD CHRONIC INACTIVE GASTRITIS.  - OXYNTIC MUCOSA WITH PROTON PUMP INHIBITOR EFFECT.  - NEGATIVE FOR INTESTINAL METAPLASIA, DYSPLASIA, AND MALIGNANCY.   Colonoscopy 03/30/2019 - The examined portion of the ileum was normal. - The entire examined colon is normal. - Non-bleeding external hemorrhoids. - No specimens collected.  Denies family history of esophageal cancer, stomach cancer, colon cancer  Past Medical History:  Diagnosis Date   Allergy    Anemia    Arthritis    spine   Bilateral leg edema    Chest pain 12/15/2014   Chronic sciatica 12/10/2014   Essential hypertension 12/10/2014   Gastroesophageal reflux disease without esophagitis    Mixed hyperlipidemia    Obesity (BMI 30.0-34.9) 12/10/2014   Sleep apnea    waiting for cpap   Type 2 diabetes mellitus without complication (HCC) 12/10/2014   Umbilical hernia     Past Surgical History:  Procedure Laterality Date   ABDOMINAL HYSTERECTOMY     COLONOSCOPY WITH PROPOFOL N/A 03/30/2019   Procedure: COLONOSCOPY WITH PROPOFOL;  Surgeon: Toney ReilVanga, Jaime Grizzell Reddy, MD;  Location: [facility] ENDOSCOPY;  Service: Gastroenterology;  Laterality: N/A;   ESOPHAGOGASTRODUODENOSCOPY (EGD) WITH PROPOFOL N/A 03/12/2017    Procedure: ESOPHAGOGASTRODUODENOSCOPY (EGD) WITH PROPOFOL;  Surgeon: Toney ReilVanga, Vivek Grealish Reddy, MD;  [facility];  Service: Gastroenterology;  Laterality: N/A;   ESOPHAGOGASTRODUODENOSCOPY (EGD) WITH PROPOFOL N/A 03/30/2019   Procedure: ESOPHAGOGASTRODUODENOSCOPY (EGD) WITH PROPOFOL;  Surgeon: Toney ReilVanga, Alianys Chacko Reddy, MD;  [facility];  Service: Gastroenterology;  Laterality: N/A;   HERNIA REPAIR     INCISIONAL HERNIA REPAIR N/A 11/29/2020   Procedure: HERNIA REPAIR INCISIONAL, open;  Surgeon: Leafy RoPabon, Diego F, MD;  Location: [facility] ORS;  Service: General;  Laterality: N/A;   ORIF ANKLE FRACTURE Left 07/30/2019   Procedure: OPEN REDUCTION INTERNAL FIXATION (ORIF) OF LEFT BIMALLEOLAR ANKLE FRACTURE;  Surgeon: Signa KellPatel, Sunny, MD;  [facility];  Service: Orthopedics;  Laterality: Left;  Diabetic - oral meds   UMBILICAL HERNIA REPAIR N/A 05/01/2017   Procedure: HERNIA REPAIR UMBILICAL ADULT;  Surgeon: Ancil Linseyavis, Jason Evan, MD;  Location: [facility] ORS;  Service: General;  Laterality: N/A;   XI ROBOTIC ASSISTED VENTRAL HERNIA N/A 10/06/2019   Procedure: XI ROBOTIC ASSISTED VENTRAL HERNIA;  Surgeon: Leafy RoPabon, Diego F, MD;  Location: [facility] ORS;  Service: General;  Laterality: N/A;     Current Outpatient Medications:    Accu-Chek Softclix Lancets  lancets, TEST TWICE DAILY, Disp: 100 each, Rfl: 12   ALLERGY RELIEF 180 MG tablet, TAKE 1 TABLET BY MOUTH EVERY DAY (Patient taking differently: Take 180 mg by mouth daily.), Disp: 90 tablet, Rfl: 3   amLODipine (NORVASC) 5 MG tablet, Take 5 mg by mouth daily., Disp: , Rfl:    aspirin EC 81 MG tablet, Take 81 mg by mouth daily. Swallow whole., Disp: , Rfl:    atorvastatin (LIPITOR) 20 MG tablet, Take 1 tablet (20 mg total) by mouth daily. TAKE 1 TABLET(20 MG) BY MOUTH DAILY, Disp: 90 tablet, Rfl: 1   Cholecalciferol (VITAMIN D-3) 5000 units TABS, Take 5,000 Units by mouth daily., Disp: , Rfl:    DULoxetine (CYMBALTA) 60 MG capsule, Take 1 capsule  (60 mg total) by mouth daily., Disp: 90 capsule, Rfl: 1   empagliflozin (JARDIANCE) 25 MG TABS tablet, Take 1 tablet (25 mg total) by mouth daily., Disp: 90 tablet, Rfl: 1   EPINEPHrine 0.3 mg/0.3 mL IJ SOAJ injection, Inject 0.3 mg into the muscle as needed for anaphylaxis., Disp: 1 each, Rfl: 12   etodolac (LODINE) 400 MG tablet, Take 400 mg by mouth 2 (two) times daily., Disp: , Rfl:    gabapentin (NEURONTIN) 300 MG capsule, Take 1 capsule (300 mg total) by mouth 3 (three) times daily., Disp: 90 capsule, Rfl: 2   hydrochlorothiazide (HYDRODIURIL) 25 MG tablet, Take 25 mg by mouth daily., Disp: , Rfl:    ketoconazole (NIZORAL) 2 % cream, APPLY TOPICALLY TO THE AFFECTED AREA DAILY (Patient taking differently: Apply 1 application topically daily as needed for irritation.), Disp: 60 g, Rfl: 1   Magnesium 500 MG TABS, Take 500 mg by mouth daily., Disp: , Rfl:    metFORMIN (GLUCOPHAGE) 500 MG tablet, Take 2 tablets (1,000 mg total) by mouth 2 (two) times daily with a meal., Disp: 360 tablet, Rfl: 1   montelukast (SINGULAIR) 10 MG tablet, Take 1 tablet (10 mg total) by mouth at bedtime. TAKE 1 TABLET(10 MG) BY MOUTH AT BEDTIME, Disp: 90 tablet, Rfl: 1   omeprazole (PRILOSEC) 40 MG capsule, TAKE 1 CAPSULE(40 MG) BY MOUTH BID (Patient taking differently: Take 40 mg by mouth in the morning and at bedtime. TAKE 1 CAPSULE(40 MG) BY MOUTH BID), Disp: 180 capsule, Rfl: 1   ondansetron (ZOFRAN ODT) 4 MG disintegrating tablet, Take 1-2 tablets (4-8 mg total) by mouth every 8 (eight) hours as needed for nausea or vomiting., Disp: 60 tablet, Rfl: 6   oxyCODONE-acetaminophen (PERCOCET) 5-325 MG tablet, Take 1-2 tablets by mouth every 4 (four) hours as needed for severe pain., Disp: 30 tablet, Rfl: 0   pimecrolimus (ELIDEL) 1 % cream, Apply 1 application topically 2 (two) times daily as needed (irriatation)., Disp: , Rfl:    potassium chloride SA (KLOR-CON) 10 MEQ tablet, Take 3 tablets (30 mEq total) by mouth daily.,  Disp: 90 tablet, Rfl: 1   valACYclovir (VALTREX) 1000 MG tablet, Take 1 tablet (1,000 mg total) by mouth 2 (two) times daily. (Patient taking differently: Take 1,000 mg by mouth 2 (two) times daily as needed (shingles).), Disp: 20 tablet, Rfl: 0   vitamin B-12 (CYANOCOBALAMIN) 1000 MCG tablet, Take 1,000 mcg by mouth 2 (two) times daily., Disp: , Rfl:    sucralfate (CARAFATE) 1 g tablet, Take 1 tablet (1 g total) by mouth 3 (three) times daily., Disp: 90 tablet, Rfl: 2   Family History  Problem Relation Age of Onset   Diabetes Mother    Cancer Mother  breast and stomach   Breast cancer Mother 38   Dementia Mother    Heart disease Father    Kidney disease Father        dialysis   Cancer Father        colon   Diabetes Father    Hypertension Sister    Hypertension Brother    Hypertension Daughter    Congestive Heart Failure Maternal Grandmother    Diabetes Maternal Grandmother    Hypertension Maternal Grandmother      Social History   Tobacco Use   Smoking status: Never   Smokeless tobacco: Never  Vaping Use   Vaping Use: Never used  Substance Use Topics   Alcohol use: Yes    Alcohol/week: 3.0 standard drinks    Types: 3 Glasses of wine per week   Drug use: No    Allergies as of 12/06/2020 - Review Complete 12/06/2020  Allergen Reaction Noted   Benazepril Swelling 10/07/2020   Ivp dye [iodinated diagnostic agents] Anaphylaxis 12/02/2018   Lyrica [pregabalin] Swelling 07/15/2020   Shellfish allergy Anaphylaxis and Swelling 12/15/2014   Shellfish-derived products Anaphylaxis 12/15/2014   Trulicity [dulaglutide] Swelling 09/13/2020   Ace inhibitors Swelling 06/05/2016    Review of Systems:    All systems reviewed and negative except where noted in HPI.   Physical Exam:  BP 129/88 (BP Location: Left Arm, Patient Position: Sitting, Cuff Size: Normal)   Pulse 93   Temp 98.1 F (36.7 C) (Oral)   Ht 5\' 3"  (1.6 m)   Wt 162 lb (73.5 kg)   BMI 28.70 kg/m  No  LMP recorded. Patient has had a hysterectomy.  General:   Alert,  Well-developed, well-nourished, pleasant and cooperative in NAD Head:  Normocephalic and atraumatic. Eyes:  Sclera clear, no icterus.   Conjunctiva pink. Ears:  Normal auditory acuity. Nose:  No deformity, discharge, or lesions. Mouth:  No deformity or lesions,oropharynx pink & moist. Neck:  Supple; no masses or thyromegaly. Lungs:  Respirations even and unlabored.  Clear throughout to auscultation.   No wheezes, crackles, or rhonchi. No acute distress. Heart:  Regular rate and rhythm; no murmurs, clicks, rubs, or gallops. Abdomen:  Normal bowel sounds.  No bruits.  Soft, mild tenderness at the recent incision site, moderately distended, diffuse, tympanic to percussion, without masses, hepatosplenomegaly or hernias noted.  No guarding or rebound tenderness.    Msk:  Symmetrical without gross deformities. Good, equal movement & strength bilaterally. Pulses:  Normal pulses noted. Extremities:  No clubbing or edema.  No cyanosis. Neurologic:  Alert and oriented x3;  grossly normal neurologically. Skin:  Intact without significant lesions or rashes. No jaundice. Psych:  Alert and cooperative. Normal mood and affect.  Imaging Studies: No results found.   Assessment and Plan:   Ellar Hakala is a 60 y.o. black  female with chronic GERD without esophagitis and H Pylori infection, Status post-triple therapy with confirmed eradication by breath test and H. pylori stool antigen.  History of ventral hernia repair in 2021 followed by incisional hernia repair in 11/2020.  Patient continues to have intermittent nausea associated with nonbloody diarrhea, abdominal bloating, worse postprandial, gradual weight loss.  She is found to have pancreatic atrophy with fatty pancreas.   Recommend to check pancreatic fecal elastase levels to evaluate for exocrine pancreatic insufficiency Trial of pancreatic enzymes, samples provided.  Patient will  contact my office if these are working   Follow up in 2 to 3 months  12/2020, MD

## 2020-12-06 NOTE — Telephone Encounter (Signed)
  Care Management   Follow Up Note   12/06/2020 Name: Lisa Galvan MRN: [id number] DOB: June 17, 1960   Referred by: Dorcas Carrow, DO Reason for referral : Care Coordination [facility]; Follow up for Chronic Disease Management and Care Coordination Needs)   An unsuccessful telephone outreach was attempted today. The patient was referred to the case management team for assistance with care management and care coordination.   Follow Up Plan: A HIPPA compliant phone message was left for the patient providing contact information and requesting a return call.   Alto Denver RN, MSN, CCM Community Care Coordinator [place]  [facility] Mobile: [phone]

## 2020-12-06 NOTE — Telephone Encounter (Signed)
Requested Prescriptions  Pending Prescriptions Disp Refills  . sucralfate (CARAFATE) 1 g tablet [Pharmacy Med Name: SUCRALFATE 1GM TABLETS] 270 tablet 0    Sig: TAKE 1 TABLET(1 GRAM) BY MOUTH THREE TIMES DAILY     Gastroenterology: Antiacids Passed - 12/06/2020  6:27 PM      Passed - Valid encounter within last 12 months    Recent Outpatient Visits          4 days ago Diabetic peripheral neuropathy [facility])   [facility] [place], Megan P, DO   2 weeks ago Non-intractable vomiting with nausea, unspecified vomiting type   [facility], Megan P, DO   1 month ago Epigastric pain   [facility] [place], Megan P, DO   2 months ago Mouth swelling   [facility] [place], Lauren A, NP   3 months ago Essential hypertension   [facility] [place], [place], DO      Future Appointments            In 3 months Johnson, Oralia Rud, DO [employer]   In 3 months Vanga, Loel Dubonnet, MD  GI [employer]

## 2020-12-06 NOTE — Patient Instructions (Signed)
Gave Samples of Zenpep. Take 2 capsules with the first bite of each meal and 1 capsule with the first bite of each snack

## 2020-12-12 ENCOUNTER — Ambulatory Visit (INDEPENDENT_AMBULATORY_CARE_PROVIDER_SITE_OTHER): Payer: BC Managed Care – PPO | Admitting: Surgery

## 2020-12-12 ENCOUNTER — Other Ambulatory Visit: Payer: Self-pay

## 2020-12-12 ENCOUNTER — Encounter: Payer: Self-pay | Admitting: Surgery

## 2020-12-12 VITALS — BP 133/91 | HR 98 | Temp 98.9°F | Ht 63.0 in | Wt 159.0 lb

## 2020-12-12 DIAGNOSIS — R2681 Unsteadiness on feet: Secondary | ICD-10-CM | POA: Diagnosis not present

## 2020-12-12 DIAGNOSIS — Z09 Encounter for follow-up examination after completed treatment for conditions other than malignant neoplasm: Secondary | ICD-10-CM

## 2020-12-12 NOTE — Patient Instructions (Addendum)
You may return to work on 12/27/20. Follow up here in about 3 months.

## 2020-12-13 ENCOUNTER — Telehealth: Payer: BLUE CROSS/BLUE SHIELD

## 2020-12-13 ENCOUNTER — Encounter: Payer: Self-pay | Admitting: Surgery

## 2020-12-13 NOTE — Progress Notes (Signed)
  Is a 60 year old female known to me with prior incisional hernia repair left subcostal area with mesh.  She did well.  No fevers or chills some soreness. Taking p.o.  PE NAD Abd: soft, probably incisional tenderness in the  A/P doing very well.  Repair with mesh.  No complications.  We will see her back in a few months.

## 2020-12-14 ENCOUNTER — Ambulatory Visit: Payer: Self-pay | Admitting: General Practice

## 2020-12-14 ENCOUNTER — Telehealth: Payer: BC Managed Care – PPO | Admitting: General Practice

## 2020-12-14 DIAGNOSIS — K439 Ventral hernia without obstruction or gangrene: Secondary | ICD-10-CM

## 2020-12-14 DIAGNOSIS — E785 Hyperlipidemia, unspecified: Secondary | ICD-10-CM

## 2020-12-14 DIAGNOSIS — I10 Essential (primary) hypertension: Secondary | ICD-10-CM

## 2020-12-14 DIAGNOSIS — E1140 Type 2 diabetes mellitus with diabetic neuropathy, unspecified: Secondary | ICD-10-CM

## 2020-12-14 NOTE — Patient Instructions (Signed)
Visit Information   Goals Addressed             This Visit's Progress    RNCM: Hernia Repair surgery and recovery       12-14-2020: The patient saw the surgeon on 12-12-2020. She is doing well post hernia repair surgery. The patient will follow up with the surgeon in 3 months. The patient states she is recovering well and working with PT for strengthening. Is using her cane. Denies any falls. Patient goals: -monitor for fever, chills, sx and sx of infection -call the office for changes in condition, questions, or concerns -keep appointments -monitor for foods that cause increased pain or abdominal issues -take medications as prescribed        Patient verbalizes understanding of instructions provided today and agrees to view in MyChart.   Telephone follow up appointment with care management team member scheduled for: 02-08-2021 at 0945 am  Alto Denver RN, MSN, CCM Community Care Coordinator [place]  [facility] Mobile: [phone]

## 2020-12-14 NOTE — Chronic Care Management (AMB) (Signed)
Care Management    RN Visit Note  12/14/2020 Name: Lisa Galvan MRN: [id number] DOB: 15-May-1960  Subjective: Lisa Galvan is a 60 y.o. year old female who is a primary care patient of [name], DO. The care management team was consulted for assistance with disease management and care coordination needs.    Engaged with patient by telephone for follow up visit in response to provider referral for case management and/or care coordination services.   Consent to Services:   [name] was given information about Care Management services today including:  Care Management services includes personalized support from designated clinical staff supervised by her physician, including individualized plan of care and coordination with other care providers 24/7 contact phone numbers for assistance for urgent and routine care needs. The patient may stop case management services at any time by phone call to the office staff.  Patient agreed to services and consent obtained.   Assessment: Review of patient past medical history, allergies, medications, health status, including review of consultants reports, laboratory and other test data, was performed as part of comprehensive evaluation and provision of chronic care management services.   SDOH (Social Determinants of Health) assessments and interventions performed:  SDOH Interventions    Flowsheet Row Most Recent Value  SDOH Interventions   Physical Activity Interventions Other (Comments)  [working with PT, no structured activity]  Social Connections Interventions Other (Comment)  [good support system]        Care Plan  Allergies  Allergen Reactions   Benazepril Swelling    Face and mouth swelling.   Ivp Dye [Iodinated Diagnostic Agents] Anaphylaxis    Patient unsure of reaction but in case of reaction d/t shellfish allergy   Lyrica [Pregabalin] Swelling    Face/ mouth swelled up balloon   Shellfish Allergy Anaphylaxis and Swelling     Betadine on skin is okay   Shellfish-Derived Products Anaphylaxis   Trulicity [Dulaglutide] Swelling    Face and mouth swelled up   Ace Inhibitors Swelling    Outpatient Encounter Medications as of 12/14/2020  Medication Sig Note   Accu-Chek Softclix Lancets lancets TEST TWICE DAILY    ALLERGY RELIEF 180 MG tablet TAKE 1 TABLET BY MOUTH EVERY DAY (Patient taking differently: Take 180 mg by mouth daily.)    amLODipine (NORVASC) 5 MG tablet Take 5 mg by mouth daily.    aspirin EC 81 MG tablet Take 81 mg by mouth daily. Swallow whole.    atorvastatin (LIPITOR) 20 MG tablet Take 1 tablet (20 mg total) by mouth daily. TAKE 1 TABLET(20 MG) BY MOUTH DAILY    Cholecalciferol (VITAMIN D-3) 5000 units TABS Take 5,000 Units by mouth daily.    DULoxetine (CYMBALTA) 60 MG capsule Take 1 capsule (60 mg total) by mouth daily.    empagliflozin (JARDIANCE) 25 MG TABS tablet Take 1 tablet (25 mg total) by mouth daily.    EPINEPHrine 0.3 mg/0.3 mL IJ SOAJ injection Inject 0.3 mg into the muscle as needed for anaphylaxis. 11/29/2020: Pt hasn't needed to use this med in a long time    etodolac (LODINE) 400 MG tablet Take 400 mg by mouth 2 (two) times daily.    gabapentin (NEURONTIN) 300 MG capsule Take 1 capsule (300 mg total) by mouth 3 (three) times daily.    hydrochlorothiazide (HYDRODIURIL) 25 MG tablet Take 25 mg by mouth daily.    ketoconazole (NIZORAL) 2 % cream APPLY TOPICALLY TO THE AFFECTED AREA DAILY (Patient taking differently: Apply 1 application  topically daily as needed for irritation.)    Magnesium 500 MG TABS Take 500 mg by mouth daily.    metFORMIN (GLUCOPHAGE) 500 MG tablet Take 2 tablets (1,000 mg total) by mouth 2 (two) times daily with a meal.    montelukast (SINGULAIR) 10 MG tablet Take 1 tablet (10 mg total) by mouth at bedtime. TAKE 1 TABLET(10 MG) BY MOUTH AT BEDTIME    omeprazole (PRILOSEC) 40 MG capsule TAKE 1 CAPSULE(40 MG) BY MOUTH BID (Patient taking differently: Take 40 mg by mouth  in the morning and at bedtime. TAKE 1 CAPSULE(40 MG) BY MOUTH BID)    ondansetron (ZOFRAN ODT) 4 MG disintegrating tablet Take 1-2 tablets (4-8 mg total) by mouth every 8 (eight) hours as needed for nausea or vomiting.    oxyCODONE-acetaminophen (PERCOCET) 5-325 MG tablet Take 1-2 tablets by mouth every 4 (four) hours as needed for severe pain.    pimecrolimus (ELIDEL) 1 % cream Apply 1 application topically 2 (two) times daily as needed (irriatation).    potassium chloride SA (KLOR-CON) 10 MEQ tablet Take 3 tablets (30 mEq total) by mouth daily.    sucralfate (CARAFATE) 1 g tablet TAKE 1 TABLET(1 GRAM) BY MOUTH THREE TIMES DAILY    valACYclovir (VALTREX) 1000 MG tablet Take 1 tablet (1,000 mg total) by mouth 2 (two) times daily. (Patient taking differently: Take 1,000 mg by mouth 2 (two) times daily as needed (shingles).)    vitamin B-12 (CYANOCOBALAMIN) 1000 MCG tablet Take 1,000 mcg by mouth 2 (two) times daily.    No facility-administered encounter medications on file as of 12/14/2020.    Patient Active Problem List   Diagnosis Date Noted   LVH (left ventricular hypertrophy) due to hypertensive disease, without heart failure 11/23/2020   SOBOE (shortness of breath on exertion) 10/27/2020   Abnormal ECG 10/27/2020   Aortic atherosclerosis ([place]) 07/12/2020   OSA (obstructive sleep apnea) 05/23/2020   Coccygodynia 02/01/2020   Facial swelling 07/21/2019   Acute postoperative pain 06/30/2019   History of allergy to radiographic contrast media 06/02/2019   History of allergy to shellfish 06/02/2019   History of Allergy to iodine 06/02/2019    Class: History of   History of anaphylaxis 06/02/2019   Other spondylosis, sacral and sacrococcygeal region 04/21/2019   Abnormal MRI, cervical spine (02/08/2019) 03/25/2019   Lower extremity weakness (Bilateral) 03/25/2019   Coordination impairment (lower extremity) 03/25/2019   Hypomagnesemia 03/24/2019   Spondylosis without myelopathy or  radiculopathy, lumbosacral region 03/24/2019   Lumbar Facet Hypertrophy 03/24/2019   Grade 1 Anterolisthesis of L4/5 03/11/2019   Chronic hip pain (Bilateral) (R>L) 03/11/2019   Chronic sacroiliac joint pain (Bilateral) 03/11/2019   Sacroiliac joint somatic dysfunction (Bilateral) 03/11/2019   Chronic feet pain (Primary Area of Pain) (Bilateral) 03/11/2019   Chronic leg and foot pain (Bilateral) 03/11/2019   Diabetic peripheral neuropathy ([place]) 03/11/2019   Chronic neuropathic pain 03/11/2019   Neurogenic pain 03/11/2019   Chronic musculoskeletal pain 03/11/2019   Osteoarthritis involving multiple joints 03/11/2019   Chronic pain syndrome 03/10/2019   Pharmacologic therapy 03/10/2019   Disorder of skeletal system 03/10/2019   Problems influencing health status 03/10/2019   Chronic low back pain (Third area of Pain) (Bilateral) w/o sciatica 03/10/2019   Chronic lower extremity pain (Secondary area of Pain) (Bilateral) 03/10/2019   Abnormal MRI, lumbar spine (02/08/2019) 03/10/2019   Abnormal findings on diagnostic imaging of other parts of musculoskeletal system 03/10/2019   Essential hypertension 10/06/2018   Cholelithiasis 06/23/2018  Fatty liver 78/58/8502   Helicobacter pylori infection 05/13/2017   Mastalgia in female 04/09/2017   Bilateral leg edema 07/02/2016   DDD (degenerative disc disease), lumbar 12/16/2014   Lumbar facet syndrome (Bilateral) 12/16/2014   Sacroiliac joint dysfunction 12/16/2014   Neuropathy due to secondary diabetes ([place]) 12/16/2014   Vitamin D deficiency 12/13/2014   Morbid obesity ([place]) 12/10/2014   Type 2 diabetes mellitus with diabetic neuropathy, unspecified ([place]) 12/10/2014   Hyperlipidemia 12/10/2014   Gastroesophageal reflux disease without esophagitis 12/10/2014    Conditions to be addressed/monitored: HTN, HLD, DMII, and hernia repair post surgical evaluation   Care Plan : RNCM: Diabetes Type 2 (Adult)  Updates made by Vanita Ingles  since 12/14/2020 12:00 AM     Problem: RNCM: Glycemic Management (Diabetes, Type 2)   Priority: Medium     Long-Range Goal: RNCM: Glycemic Management Optimized   Start Date: 06/07/2020  Expected End Date: 06/07/2021  This Visit's Progress: On track  Priority: Medium  Note:   Objective:  Lab Results  Component Value Date   HGBA1C 6.7 12/02/2020     Lab Results  Component Value Date   CREATININE 0.70 11/29/2020    No results found for: EGFR Current Barriers:  Knowledge Deficits related to basic Diabetes pathophysiology and self care/management Knowledge Deficits related to medications used for management of diabetes Financial Constraints Limited Social Support Unable to independently manage DM Lacks social connections Does not maintain contact with provider office Does not contact provider office for questions/concerns Case Manager Clinical Goal(s):  Collaboration with [name], DO regarding development and update of comprehensive plan of care as evidenced by provider attestation and co-signature Inter-disciplinary care team collaboration (see longitudinal plan of care)  patient will demonstrate improved adherence to prescribed treatment plan for diabetes self care/management as evidenced by:  daily monitoring and recording of CBG  adherence to ADA/ carb modified diet adherence to prescribed medication regimen Interventions:  Provided education to patient about basic DM disease process Reviewed medications with patient and discussed importance of medication adherence. 08-02-2020: Reviewed her change in Jardiance and the patient is tolerating well. Denies any issues at this time. 10-11-2020: The patient is compliant with medications does have to hold her Metformin and other medications due to having surgery for hernia repair next Tuesday the 28th. 12-14-2020: The patient is compliant with medications. Denies any issues with medications at this time.  Discussed plans with  patient for ongoing care management follow up and provided patient with direct contact information for care management team Provided patient with written educational materials related to hypo and hyperglycemia and importance of correct treatment.  10-11-2020: The patient denies any episodes of low blood sugars. Blood sugar this am is 156.  Hemoglobin A1C is elevated from previous hemoglobin A1C.  12-14-2020: The new A1C was under 7.0. Praised the patient for good work. Denies any highs or lows. States things are stabilizing out with her blood sugar control.  Reviewed scheduled/upcoming provider appointments including: 03-06-2021 at 0940 am Advised patient, providing education and rationale, to check cbg BID and record, calling pcp for findings outside established parameters.  10-11-2020: States blood sugars are better since medication changes. The patient verbalized this am her blood sugar was 156. 12-14-2020: Better blood sugar control now and feels she is doing well with management of her DM. Will continue to monitor.  Referral made to pharmacy team for assistance with help with letter she received about "MIV" and needing documentation that she is not on  insulin and other diagnosis information.  The patient to send e-mail with information to the [place] will forward to CCM team pharmacist. 06-07-2020: The patient is still working on paperwork. Has what she needs from the office. 08-02-2020: The patient has found and insurance company that she likes. She has what she needs currently. 12-14-2020: Denies any new needs at this time with medications or paperwork. Will continue to monitor.   Collaboration with the pcp and CCM pharmacist concerning needing documentation that she needs a letter stating she does not have multiple heart conditions and she is not insulin dependent. Will send in basket message to the pcp and pharmacist for assistance. Did advise the patient that she could copy her last office visit  note off of my Chart that had her diagnosis and medications listed. 06-07-2020: The patient is working with pharmacist. 12-14-2020: Ongoing support from pharm D. No new needs at this time.  Review of patient status, including review of consultants reports, relevant laboratory and other test results, and medications completed. In basket message to Dr. Wynetta Emery and clinical staff at [facility] to make a referral to the breast center in [place] for annual mammogram. 08-02-2020: The patient states that she had her mammogram and she is doing well. She says the radiologist feels that it is fatty tissue and recommended that she take Vitamin E. She has not started the Vitamin E yet.  Patient Goals/Self-Care Activities  patient will:  - UNABLE to independently manage DM Attends all scheduled provider appointments Checks blood sugars as prescribed and utilize hyper and hypoglycemia protocol as needed Adheres to prescribed ADA/carb modified. 10-11-2020: The patient is working on dietary changes.  The RNCM sending planning healthy meals document via mail to the patient. 12-14-2020: The patient has had changes in her dietary habits - barriers to adherence to treatment plan identified - blood glucose monitoring encouraged - blood glucose readings reviewed.  10-11-2020: This am blood sugar is 156 - individualized medical nutrition therapy provided - mutual A1C goal set or reviewed- 10-11-2020: review of goal of hemoglobin A1C of <7.0. 12-14-2020: The last hemoglobin A1C is in goal. The patient is doing well and denies any new concerns with DM at this time. Will continue to monitor for changes.  - resources required to improve adherence to care identified - self-awareness of signs/symptoms of hypo or hyperglycemia encouraged.  12-14-2020: The patient states that she is aware of the sx and sx of hypo/hyperglycemia.  - use of blood glucose monitoring log promoted Follow Up Plan: Telephone follow up appointment with care management  team member scheduled for: 02-08-2021 of 0945 am    Task: RNCM: Alleviate Barriers to Glycemic Management Completed 12/14/2020  Outcome: Positive  Note:   Care Management Activities:    - barriers to adherence to treatment plan identified - blood glucose monitoring encouraged - blood glucose readings reviewed - individualized medical nutrition therapy provided - mutual A1C goal set or reviewed - resources required to improve adherence to care identified - self-awareness of signs/symptoms of hypo or hyperglycemia encouraged - use of blood glucose monitoring log promoted        Care Plan : RNCM: Hypertension (Adult)  Updates made by Vanita Ingles since 12/14/2020 12:00 AM     Problem: RNCM: Hypertension (Hypertension)   Priority: Medium     Long-Range Goal: RNCM: Hypertension Monitored   Start Date: 06/07/2020  Expected End Date: 06/07/2021  This Visit's Progress: On track  Priority: Medium  Note:  Objective:  Last practice recorded BP readings:  BP Readings from Last 3 Encounters:  12/12/20 (!) 133/91  12/06/20 129/88  12/02/20 120/88      Most recent eGFR/CrCl: No results found for: EGFR  No components found for: CRCL Current Barriers:  Knowledge Deficits related to basic understanding of hypertension pathophysiology and self care management Knowledge Deficits related to understanding of medications prescribed for management of hypertension Limited Social Designer, multimedia.  Unable to independently manage HTN Lacks social connections Does not maintain contact with provider office Does not contact provider office for questions/concerns Case Manager Clinical Goal(s):   patient will verbalize understanding of plan for hypertension management  patient will attend all scheduled medical appointments: 05-23-2020 at 2:20 pm  patient will demonstrate improved adherence to prescribed treatment plan for hypertension as evidenced by taking all medications as  prescribed, monitoring and recording blood pressure as directed, adhering to low sodium/DASH diet  patient will demonstrate improved health management independence as evidenced by checking blood pressure as directed and notifying PCP if SBP>160 or DBP > 90, taking all medications as prescribe, and adhering to a low sodium diet as discussed.  patient will verbalize basic understanding of hypertension disease process and self health management plan as evidenced by compliance with the plan of care, taking medications as ordered, and working with the CCM team to manage health and well being Interventions:  Collaboration with [name], DO regarding development and update of comprehensive plan of care as evidenced by provider attestation and co-signature Inter-disciplinary care team collaboration (see longitudinal plan of care) UNABLE to independently:manage HTN Evaluation of current treatment plan related to hypertension self management and patient's adherence to plan as established by provider. 10-11-2020: The patient states her blood pressure is doing good. Denies any lows. The patient states that she takes it sometimes at home. 12-14-2020: The patient is doing well with her blood pressures at this time. Little high at most recent provider visit but usually WNL. Denies any headaches or other issues with blood pressures. Will continue to monitor.  Provided education to patient re: stroke prevention, s/s of heart attack and stroke, DASH diet, complications of uncontrolled blood pressure. 06-07-2020: Review of the benefits of a heart healthy/ADA diet. Information sent to the patient via mail. 08-02-2020: The patient received information is looking forward to the fresh fruits and vegetables from the garden. Does not have an appetite and this has been over the last month. She has lost weight but has not weighed lately. Denies issues with dehydration. 10-11-2020: The patient states that she had diarrhea and  vomiting over a week ago. She had labwork for upcoming surgery next week and her potassium level was 2.4. She was told to go to the ER. She went to the ER and had potassium replacement and also a script for potassium. She is eating and doing better. 8-24-202: Review of heart healthy/ADA diet. Discussed how water follows sodium. Patient is staying away from spices foods and other foods that will exacerbate her recent hernia surgery. She is cautious of things that may cause her to have some issues with pain, diarrhea, or constipation. Education and support given.  Reviewed medications with patient and discussed importance of compliance. 08-02-2020: The patient is compliant with medications. Is coming by the office today to pick up a pill box to use at home. Will also provide information on [employer].  The patient stated that she could not afford 165.00 for an EPI pen. [employer] is a program that  allows patients to get EPI pen for 25.00. 10-11-2020: the patient is compliant with medications regimen. States she knows which medications to hold for her upcoming surgery. 12-14-2020: The patient is compliant with medications. States that she is not currently taking anything for blood pressure or fluid due to orthostatic hypotension. Will continue to monitor. Education and support given.  Discussed plans with patient for ongoing care management follow up and provided patient with direct contact information for care management team Advised patient, providing education and rationale, to monitor blood pressure daily and record, calling PCP for findings outside established parameters.  Reviewed scheduled/upcoming provider appointments including: 03-06-2021 at 0940 am Patient Goals/Self-Care Activities  patient will:  - UNABLE to independently manage HTN Calls provider office for new concerns, questions, or BP outside discussed parameters Checks BP and records as discussed Follows a low sodium diet/DASH diet - blood pressure  trends reviewed - depression screen reviewed - home or ambulatory blood pressure monitoring encouraged Follow Up Plan: Telephone follow up appointment with care management team member scheduled for: 02-08-2021 at 0945 am    Task: RNCM: Identify and Monitor Blood Pressure Elevation Completed 12/14/2020  Outcome: Positive  Note:   Care Management Activities:    - blood pressure trends reviewed - depression screen reviewed - home or ambulatory blood pressure monitoring encouraged        Care Plan : RNCM: Management of HLD  Updates made by Vanita Ingles since 12/14/2020 12:00 AM     Problem: RNCM: Management of HLD   Priority: Medium     Long-Range Goal: RNCM: Managment of HLD   Start Date: 06/07/2020  Expected End Date: 06/07/2021  This Visit's Progress: On track  Priority: Medium  Note:   Current Barriers:  Poorly controlled hyperlipidemia, complicated by HTN, DM Current antihyperlipidemic regimen: Lipitor 20 mg QD Most recent lipid panel:  Lab Results  Component Value Date   CHOL 153 09/01/2020   HDL 95 09/01/2020   LDLCALC 42 09/01/2020   TRIG 86 09/01/2020   CHOLHDL 1.4 01/03/2017    ASCVD risk enhancing conditions: age 45 DM, HTN Unable to independently manage HLD Lacks social connections Does not maintain contact with provider office Does not contact provider office for [place]):  patient will work with Consulting civil engineer, providers, and care team towards execution of optimized self-health management plan patient will verbalize understanding of plan for working with the CCM team to meet health and wellness needs  patient will work with [facility], pharmacist and pcp to address needs related to expressed needs for paperwork with documentation about chronic conditions and medicaitons  patient will attend all scheduled medical appointments: 03-06-2021 at 0940 am Interventions: Collaboration with [name], DO regarding  development and update of comprehensive plan of care as evidenced by provider attestation and co-signature Inter-disciplinary care team collaboration (see longitudinal plan of care) Medication review performed; medication list updated in electronic medical record.  Inter-disciplinary care team collaboration (see longitudinal plan of care) Referred to pharmacy team for assistance with HLD medication management. 06-07-2020: The patient currently working with the pharm D. 08-02-2020: The patient continue to work with the pharm D. 12-14-2020: The patient is compliant with medications. Denies any new concerns at this time with medications management. Evaluation of current treatment plan related to HLD and patient's adherence to plan as established by provider. 12-14-2020: The patient is compliant with the plan of care. The patient is doing well.  Advised patient to send  letter to the [facility] email address for review and forwarding to the pharmacist Provided education to patient re: printing off last office visit note from 02-18-2020 with pcp as supporting documentation to the expressed needs during the call.  06-07-2020: The patient is still working on getting new insurance. She will let RNCM know if she needs additional assistance. Did ask for supply company that could assist with script she has for a walker with seat and breaks. Provided numbers to medical supply companies in [place] for the patient to call. 08-02-2020: The patient has new insurance.  The patients needs are being met. 12-14-2020: No new needs at this time related to office visit notes or information needed for insurance.  Reviewed medications with patient and discussed compliance. 12-14-2020:  The patient is compliant with her medications denies any needs at this time.  Collaborated with pcp and pharmacist  regarding needs expressed for documentation needed for insurance purposes. 10-11-2020- this has been completed.  Discussed plans with patient  for ongoing care management follow up and provided patient with direct contact information for care management team Reviewed scheduled/upcoming provider appointments including: 03-06-2021 Pharmacy referral for assistance with expressed paperwork for insurance purposes. 08-02-2020 completed Review of SDOH- the patient denies any needs. May be interested in food resources when she has her hernia repair surgery. Patient has information and will let RNCM know if changes are needed.  10-11-2020: the patient is interested in food resources in the county for help with food. Will place a care guide referral for assistance. 12-14-2020: The patient has resources for [facility] and denies any new concerns at this time.    Patient Goals/Self-Care Activities:  patient will:   - call for medicine refill 2 or 3 days before it runs out - call if I am sick and can't take my medicine - keep a list of all the medicines I take; vitamins and herbals too - learn to read medicine labels - use a pillbox to sort medicine - use an alarm clock or phone to remind me to take my medicine - change to whole grain breads, cereal, pasta - drink 6 to 8 glasses of water each day - eat 3 to 5 servings of fruits and vegetables each day - fill half the plate with nonstarchy vegetables - limit fast food meals to no more than 1 per week - manage portion size - prepare main meal at home 3 to 5 days each week - read food labels for fat, fiber, carbohydrates and portion size - be open to making changes - I can manage, know and watch for signs of a heart attack - if I have chest pain, call for help - learn about small changes that will make a big difference - learn my personal risk factors  - barriers to medication adherence identified - medication list compiled - medication list reviewed - medication-adherence assessment completed - self-management plan initiated or updated - understanding of current medications assessed    Follow Up Plan: Telephone follow up appointment with care management team member scheduled for: 02-08-2021 at 0945 am      Task: RNCM: management of HLD Completed 12/14/2020  Outcome: Positive  Note:   Care Management Activities:    - barriers to medication adherence identified - medication list compiled - medication list reviewed - medication-adherence assessment completed - self-management plan initiated or updated - understanding of current medications assessed         Plan: Telephone follow up appointment with care  management team member scheduled for:  02-08-2021 at [place] am  [name] RN, MSN, [facility] Mobile: [phone]

## 2020-12-15 DIAGNOSIS — R2681 Unsteadiness on feet: Secondary | ICD-10-CM | POA: Diagnosis not present

## 2020-12-21 DIAGNOSIS — R2681 Unsteadiness on feet: Secondary | ICD-10-CM | POA: Diagnosis not present

## 2020-12-22 LAB — HM DIABETES EYE EXAM

## 2020-12-26 ENCOUNTER — Telehealth: Payer: BC Managed Care – PPO

## 2021-01-02 ENCOUNTER — Ambulatory Visit: Payer: BC Managed Care – PPO | Admitting: Licensed Clinical Social Worker

## 2021-01-02 NOTE — Chronic Care Management (AMB) (Signed)
Care Management Clinical Social Work Note  01/02/2021 Name: Lisa HamSharon Galvan MRN: [id number] DOB: May 10, 1960  Lisa Galvan is a 60 y.o. year old female who is a primary care patient of Dorcas CarrowJohnson, Megan P, DO.  The Care Management team was consulted for assistance with chronic disease management and coordination needs.  Engaged with patient by telephone for follow up visit in response to provider referral for social work chronic care management and care coordination services  Consent to Services:  [name] was given information about Care Management services today including:  Care Management services includes personalized support from designated clinical staff supervised by her physician, including individualized plan of care and coordination with other care providers 24/7 contact phone numbers for assistance for urgent and routine care needs. The patient may stop case management services at any time by phone call to the office staff.  Patient agreed to services and consent obtained.   Consent to Services:  The patient was given information about Care Management services, agreed to services, and gave verbal consent prior to initiation of services.  Please see initial visit note for detailed documentation.   Patient agreed to services today and consent obtained.   Assessment: Engaged with patient by phone in response to provider referral for social work care coordination services: Mental Health Counseling and Resources.    Patient continues to maintain positive progress with care plan goals. She reports continued difficulty with management of neuropathy in feet and difficulty managing increase in costs for medicines. CCM LCSW has sent message to CCM Pharmacist to assist with supportive resources. See Care Plan below for interventions and patient self-care activities.  Recent life changes or stressors: Pain management and financial strain  Recommendation: Patient may benefit from, and is in agreement  work with LCSW to address care coordination needs and will continue to work with the clinical team to address health care and disease management related needs.   Follow up Plan: Patient would like continued follow-up from CCM LCSW .  per patient's request will follow up in 04/03/21.  Will call office if needed prior to next encounter.  SDOH (Social Determinants of Health) assessments and interventions performed:  NA  Advanced Directives Status: Not addressed in this encounter.  Care Plan  Allergies  Allergen Reactions   Benazepril Swelling    Face and mouth swelling.   Ivp Dye [Iodinated Diagnostic Agents] Anaphylaxis    Patient unsure of reaction but in case of reaction d/t shellfish allergy   Lyrica [Pregabalin] Swelling    Face/ mouth swelled up balloon   Shellfish Allergy Anaphylaxis and Swelling    Betadine on skin is okay   Shellfish-Derived Products Anaphylaxis   Trulicity [Dulaglutide] Swelling    Face and mouth swelled up   Ace Inhibitors Swelling    Outpatient Encounter Medications as of 01/02/2021  Medication Sig Note   Accu-Chek Softclix Lancets lancets TEST TWICE DAILY    ALLERGY RELIEF 180 MG tablet TAKE 1 TABLET BY MOUTH EVERY DAY (Patient taking differently: Take 180 mg by mouth daily.)    amLODipine (NORVASC) 5 MG tablet Take 5 mg by mouth daily.    aspirin EC 81 MG tablet Take 81 mg by mouth daily. Swallow whole.    atorvastatin (LIPITOR) 20 MG tablet Take 1 tablet (20 mg total) by mouth daily. TAKE 1 TABLET(20 MG) BY MOUTH DAILY    Cholecalciferol (VITAMIN D-3) 5000 units TABS Take 5,000 Units by mouth daily.    DULoxetine (CYMBALTA) 60 MG capsule Take 1  capsule (60 mg total) by mouth daily.    empagliflozin (JARDIANCE) 25 MG TABS tablet Take 1 tablet (25 mg total) by mouth daily.    EPINEPHrine 0.3 mg/0.3 mL IJ SOAJ injection Inject 0.3 mg into the muscle as needed for anaphylaxis. 11/29/2020: Pt hasn't needed to use this med in a long time    etodolac (LODINE)  400 MG tablet Take 400 mg by mouth 2 (two) times daily.    gabapentin (NEURONTIN) 300 MG capsule Take 1 capsule (300 mg total) by mouth 3 (three) times daily.    hydrochlorothiazide (HYDRODIURIL) 25 MG tablet Take 25 mg by mouth daily.    ketoconazole (NIZORAL) 2 % cream APPLY TOPICALLY TO THE AFFECTED AREA DAILY (Patient taking differently: Apply 1 application topically daily as needed for irritation.)    Magnesium 500 MG TABS Take 500 mg by mouth daily.    metFORMIN (GLUCOPHAGE) 500 MG tablet Take 2 tablets (1,000 mg total) by mouth 2 (two) times daily with a meal.    montelukast (SINGULAIR) 10 MG tablet Take 1 tablet (10 mg total) by mouth at bedtime. TAKE 1 TABLET(10 MG) BY MOUTH AT BEDTIME    omeprazole (PRILOSEC) 40 MG capsule TAKE 1 CAPSULE(40 MG) BY MOUTH BID (Patient taking differently: Take 40 mg by mouth in the morning and at bedtime. TAKE 1 CAPSULE(40 MG) BY MOUTH BID)    ondansetron (ZOFRAN ODT) 4 MG disintegrating tablet Take 1-2 tablets (4-8 mg total) by mouth every 8 (eight) hours as needed for nausea or vomiting.    oxyCODONE-acetaminophen (PERCOCET) 5-325 MG tablet Take 1-2 tablets by mouth every 4 (four) hours as needed for severe pain.    pimecrolimus (ELIDEL) 1 % cream Apply 1 application topically 2 (two) times daily as needed (irriatation).    potassium chloride SA (KLOR-CON) 10 MEQ tablet Take 3 tablets (30 mEq total) by mouth daily.    sucralfate (CARAFATE) 1 g tablet TAKE 1 TABLET(1 GRAM) BY MOUTH THREE TIMES DAILY    valACYclovir (VALTREX) 1000 MG tablet Take 1 tablet (1,000 mg total) by mouth 2 (two) times daily. (Patient taking differently: Take 1,000 mg by mouth 2 (two) times daily as needed (shingles).)    vitamin B-12 (CYANOCOBALAMIN) 1000 MCG tablet Take 1,000 mcg by mouth 2 (two) times daily.    No facility-administered encounter medications on file as of 01/02/2021.    Patient Active Problem List   Diagnosis Date Noted   LVH (left ventricular hypertrophy) due to  hypertensive disease, without heart failure 11/23/2020   SOBOE (shortness of breath on exertion) 10/27/2020   Abnormal ECG 10/27/2020   Aortic atherosclerosis (HCC) 07/12/2020   OSA (obstructive sleep apnea) 05/23/2020   Coccygodynia 02/01/2020   Facial swelling 07/21/2019   Acute postoperative pain 06/30/2019   History of allergy to radiographic contrast media 06/02/2019   History of allergy to shellfish 06/02/2019   History of Allergy to iodine 06/02/2019    Class: History of   History of anaphylaxis 06/02/2019   Other spondylosis, sacral and sacrococcygeal region 04/21/2019   Abnormal MRI, cervical spine (02/08/2019) 03/25/2019   Lower extremity weakness (Bilateral) 03/25/2019   Coordination impairment (lower extremity) 03/25/2019   Hypomagnesemia 03/24/2019   Spondylosis without myelopathy or radiculopathy, lumbosacral region 03/24/2019   Lumbar Facet Hypertrophy 03/24/2019   Grade 1 Anterolisthesis of L4/5 03/11/2019   Chronic hip pain (Bilateral) (R>L) 03/11/2019   Chronic sacroiliac joint pain (Bilateral) 03/11/2019   Sacroiliac joint somatic dysfunction (Bilateral) 03/11/2019   Chronic feet pain (Primary  Area of Pain) (Bilateral) 03/11/2019   Chronic leg and foot pain (Bilateral) 03/11/2019   Diabetic peripheral neuropathy (HCC) 03/11/2019   Chronic neuropathic pain 03/11/2019   Neurogenic pain 03/11/2019   Chronic musculoskeletal pain 03/11/2019   Osteoarthritis involving multiple joints 03/11/2019   Chronic pain syndrome 03/10/2019   Pharmacologic therapy 03/10/2019   Disorder of skeletal system 03/10/2019   Problems influencing health status 03/10/2019   Chronic low back pain (Third area of Pain) (Bilateral) w/o sciatica 03/10/2019   Chronic lower extremity pain (Secondary area of Pain) (Bilateral) 03/10/2019   Abnormal MRI, lumbar spine (02/08/2019) 03/10/2019   Abnormal findings on diagnostic imaging of other parts of musculoskeletal system 03/10/2019   Essential  hypertension 10/06/2018   Cholelithiasis 06/23/2018   Fatty liver 06/23/2018   Helicobacter pylori infection 05/13/2017   Mastalgia in female 04/09/2017   Bilateral leg edema 07/02/2016   DDD (degenerative disc disease), lumbar 12/16/2014   Lumbar facet syndrome (Bilateral) 12/16/2014   Sacroiliac joint dysfunction 12/16/2014   Neuropathy due to secondary diabetes (HCC) 12/16/2014   Vitamin D deficiency 12/13/2014   Morbid obesity (HCC) 12/10/2014   Type 2 diabetes mellitus with diabetic neuropathy, unspecified (HCC) 12/10/2014   Hyperlipidemia 12/10/2014   Gastroesophageal reflux disease without esophagitis 12/10/2014    Conditions to be addressed/monitored: Depression, Chronic Pain Syndrome; Mental Health Concerns   Care Plan : General Social Work (Adult)  Updates made by Bridgett Larsson, LCSW since 01/02/2021 12:00 AM     Problem: I'm managing my physical and mental health better now   Priority: Medium     Long-Range Goal: Depressive Symptoms Identified   Start Date: 03/21/2020  This Visit's Progress: On track  Recent Progress: On track  Priority: Medium  Note:   Current Barriers:  Limited social support Level of care concerns ADL IADL limitations Social Isolation Limited access to caregiver  Clinical Social Work Clinical Goal(s):  Over the next 120 days, patient will work with SW to address concerns related to gaining additional support/resource connection in order to maintain health  Interventions: Assessed patient's previous and current treatment, coping skills, support system and barriers to care  Patient interviewed and appropriate assessments performed Patient reports that she is experiencing nausea and vomiting for weeks. She is thinking about visiting the ED if symptoms persist and/or worsen. PCP has been notified and patient shared the medication prescribed from PCP works temporarily. Imodium has resolved the diarrhea 09/12: Patient reports that symptoms have  resolved and she had the surgery. States that she has an appetite; however, she is still losing weight Patient reports ongoing pain triggered by neuropathy in feet. Patient has an upcoming appt with pain clinic scheduled for 02/15/21 Patient reports that she is unable to keep food down. Patient endorses a decline in balance, stating "it is very bad", in addition, to a decrease in energy. States that she has been able to keep water down 09/12: Patient reports energy has improved but not at baseline Patient reports that she fell Saturday, July 23. She did not sustain any injuries 09/12: Patient reports falling in her garage 3 weeks ago. Patient called daughter to assist with getting up. Patient has been participating in Physical Therapy for approx. 4 weeks. Patient utilizes cane to reduce risk of falling Patient continues to monitor and track blood sugars which have been about 156. Patient reports weight loss over the weeks Patient is scheduled for surgery 11/29/20. Patient shared that her surgery was initially scheduled for 06/28 but her potassium  was too low. She has upcoming lab appt for 07/ 27/22 Patient reports compliance with medications. Reports difficulty affording Lantis, Cymbalta, and Suralfate. CCM LCSW will collaborate with CCM Pharmacist to advise on any supportive resources Patient continues to spend time with family and visit the church CCM LCSW provided validation and encouragement. CCM LCSW discussed importance advocating for self when she gets tired. Patient shared that she is honest about feelings and needs with loved ones. She is able to identify progress of management of health conditions, in addition, to establishing realistic expectations  Self-care strategies were discussed and patient was strongly encouraged to be aware of limitations to prevent exacerbation Patient continues to utilize calendar on fridge to maintain appointments Patient reports difficulty managing ongoing  stressors. Patient's mother has dementia and is residing in [place], [place]. Patient also suffered from a recent allergic reaction to medications, in addition, to an upcoming surgery to correct a hernia. Patient feels that medications often make her feel "not like myself" which results in withdrawn behavior and at times, low motivation. Denies SI/HI Patient reports having a strong support system that includes her best friend and church family. Patient's daughter will arrive on Monday with patient's granddaughter to provide additional support CCM provided support and validation. Psychoeducation was provided on how physical and mental health correlates with one another. LCSW commended patient for having strong insight of body and advocating for self regarding her medical treatment. Strategies to promote self-care and stress management was identified. Patient was successful in identifying protective factors to increase motivation Advised patient to contact CCM LCSW if future social work needs arise. Discussed plans with patient for ongoing care management follow up and provided patient with direct contact information for care management team Inter-disciplinary care team collaboration (see longitudinal plan of care)  Patient Self Care Activities:  Attend all scheduled appointments with providers Call provider office for new concerns or questions Utilize strategies discussed to assist in managing stress and promote self-care          Jenel Lucks, MSW, LCSW [facility] Care Management [place]  [facility] [email or web address] Phone [phone] 10:39 AM

## 2021-01-02 NOTE — Patient Instructions (Signed)
Visit Information   Goals Addressed               This Visit's Progress     Patient Stated     SW: "I need more support and resource information" (pt-stated)   On track     Timeframe:  Long-Range Goal Priority:  Medium Start Date:   06/20/20                        Expected End Date:  04/22/21                     Follow Up Date- 04/03/21   Patient Self Care Activities:  Attend all scheduled appointments with providers Call provider office for new concerns or questions Utilize strategies discussed      Other     SW-Make and Keep All Appointments   On track     Timeframe:  Long-Range Goal Priority:  Medium Start Date:   06/20/20                        Expected End Date:  04/22/21                     Follow Up Date- 04/03/21   Patient Self Care Activities:  Attend all scheduled appointments with providers Call provider office for new concerns or questions Utilize strategies discussed to assist in managing stress and promote self-care        Patient verbalizes understanding of instructions provided today and agrees to view in MyChart.   Telephone follow up appointment with care management team member scheduled for:04/03/21  Jenel Lucks, MSW, LCSW [facility] Care Management [facility]  [facility] [email or web address] Phone [phone] 10:42 AM

## 2021-01-09 DIAGNOSIS — G63 Polyneuropathy in diseases classified elsewhere: Secondary | ICD-10-CM | POA: Diagnosis not present

## 2021-01-09 DIAGNOSIS — G20 Parkinson's disease: Secondary | ICD-10-CM | POA: Diagnosis not present

## 2021-01-11 DIAGNOSIS — R2681 Unsteadiness on feet: Secondary | ICD-10-CM | POA: Diagnosis not present

## 2021-01-18 ENCOUNTER — Telehealth: Payer: Self-pay

## 2021-01-18 DIAGNOSIS — R2681 Unsteadiness on feet: Secondary | ICD-10-CM | POA: Diagnosis not present

## 2021-01-18 NOTE — Progress Notes (Signed)
I contacted [facility] pharmacy it looks like the patient is not on any insulin.I asked about her Cymbalta and Sucralfate medications of why they are expensive, it due to the day supply. For 90 days it'll cost the patient $40 but if the day supply is cut back to 30 days the price will cut to $16.

## 2021-01-23 ENCOUNTER — Telehealth: Payer: Self-pay | Admitting: Gastroenterology

## 2021-01-23 MED ORDER — ZENPEP 40000-126000 UNITS PO CPEP: ORAL_CAPSULE | ORAL | 0 refills | Status: DC

## 2021-01-23 NOTE — Telephone Encounter (Signed)
Patient verbalized understanding of instructions. Patient states he will pick up the stool kit

## 2021-01-23 NOTE — Telephone Encounter (Signed)
Pat. Calling requesting a call back. She said her samples have run out and she is wondering if she can get a prescription because the medicine did work for her. Zenpep is the name of the samples she was given.

## 2021-01-23 NOTE — Telephone Encounter (Signed)
Please advised if Zenpep can be sent to the pharmacy

## 2021-01-23 NOTE — Addendum Note (Signed)
Addended by: Radene Knee L on: 01/23/2021 11:51 AM   Modules accepted: Orders

## 2021-01-23 NOTE — Telephone Encounter (Signed)
Yes, goahead with the standard dosing on zenpep and remind her about pancreatic elastase, we ordered it in august  RV

## 2021-01-25 DIAGNOSIS — R2681 Unsteadiness on feet: Secondary | ICD-10-CM | POA: Diagnosis not present

## 2021-01-31 ENCOUNTER — Other Ambulatory Visit: Payer: Self-pay

## 2021-01-31 ENCOUNTER — Emergency Department: Payer: BC Managed Care – PPO

## 2021-01-31 ENCOUNTER — Emergency Department
Admission: EM | Admit: 2021-01-31 | Discharge: 2021-01-31 | Disposition: A | Discharge status: Home / Self Care | Payer: BC Managed Care – PPO | Attending: Emergency Medicine | Admitting: Emergency Medicine

## 2021-01-31 DIAGNOSIS — Z79899 Other long term (current) drug therapy: Secondary | ICD-10-CM | POA: Insufficient documentation

## 2021-01-31 DIAGNOSIS — E876 Hypokalemia: Secondary | ICD-10-CM | POA: Diagnosis not present

## 2021-01-31 DIAGNOSIS — Z7982 Long term (current) use of aspirin: Secondary | ICD-10-CM | POA: Insufficient documentation

## 2021-01-31 DIAGNOSIS — I10 Essential (primary) hypertension: Secondary | ICD-10-CM | POA: Diagnosis not present

## 2021-01-31 DIAGNOSIS — R079 Chest pain, unspecified: Secondary | ICD-10-CM | POA: Diagnosis not present

## 2021-01-31 DIAGNOSIS — E1140 Type 2 diabetes mellitus with diabetic neuropathy, unspecified: Secondary | ICD-10-CM | POA: Insufficient documentation

## 2021-01-31 DIAGNOSIS — R0789 Other chest pain: Secondary | ICD-10-CM | POA: Insufficient documentation

## 2021-01-31 DIAGNOSIS — Z7984 Long term (current) use of oral hypoglycemic drugs: Secondary | ICD-10-CM | POA: Diagnosis not present

## 2021-01-31 LAB — BASIC METABOLIC PANEL
Anion gap: 10 (ref 5–15)
BUN: 6 mg/dL — ABNORMAL LOW (ref 8–23)
CO2: 31 mmol/L (ref 22–32)
Calcium: 9.3 mg/dL (ref 8.9–10.3)
Chloride: 96 mmol/L — ABNORMAL LOW (ref 98–111)
Creatinine, Ser: 0.8 mg/dL (ref 0.44–1.00)
GFR, Estimated: >60 mL/min (ref >60)
Glucose, Bld: 97 mg/dL (ref 70–99)
Potassium: 2.7 mmol/L — CL (ref 3.5–5.1)
Sodium: 137 mmol/L (ref 135–145)

## 2021-01-31 LAB — CBC
HCT: 34.8 % — ABNORMAL LOW (ref 36.0–46.0)
Hemoglobin: 12.3 g/dL (ref 12.0–15.0)
MCH: 31.5 pg (ref 26.0–34.0)
MCHC: 35.3 g/dL (ref 30.0–36.0)
MCV: 89.2 fL (ref 80.0–100.0)
Platelets: 206 10*3/uL (ref 150–400)
RBC: 3.9 MIL/uL (ref 3.87–5.11)
RDW: 16.4 % — ABNORMAL HIGH (ref 11.5–15.5)
WBC: 2.8 10*3/uL — ABNORMAL LOW (ref 4.0–10.5)
nRBC: 0 % (ref 0.0–0.2)

## 2021-01-31 LAB — TROPONIN I (HIGH SENSITIVITY): Troponin I (High Sensitivity): 3 ng/L (ref <18)

## 2021-01-31 MED ORDER — ACETAMINOPHEN 500 MG PO TABS
1000.0000 mg | ORAL_TABLET | Freq: Once | ORAL | Status: AC
  Administered 2021-01-31: 1000 mg via ORAL
  Filled 2021-01-31: qty 2

## 2021-01-31 MED ORDER — POTASSIUM CHLORIDE CRYS ER 20 MEQ PO TBCR
40.0000 meq | EXTENDED_RELEASE_TABLET | Freq: Once | ORAL | Status: AC
  Administered 2021-01-31: 40 meq via ORAL
  Filled 2021-01-31: qty 2

## 2021-01-31 MED ORDER — NAPROXEN 500 MG PO TABS
500.0000 mg | ORAL_TABLET | Freq: Once | ORAL | Status: AC
  Administered 2021-01-31: 500 mg via ORAL
  Filled 2021-01-31: qty 1

## 2021-01-31 MED ORDER — LIDOCAINE 5 % EX PTCH
1.0000 | MEDICATED_PATCH | Freq: Once | CUTANEOUS | Status: DC
  Administered 2021-01-31: 1 via TRANSDERMAL
  Filled 2021-01-31: qty 1

## 2021-01-31 MED ORDER — LIDOCAINE 5 % EX PTCH: 1.0000 | MEDICATED_PATCH | Freq: Two times a day (BID) | CUTANEOUS | 0 refills | Status: DC

## 2021-01-31 NOTE — ED Notes (Signed)
Urine specimen at bedside if needed.

## 2021-01-31 NOTE — ED Notes (Signed)
Patient given discharge instructions, all questions answered. Patient in possession of all belongings, directed to the discharge area  

## 2021-01-31 NOTE — Discharge Instructions (Signed)
Use Tylenol for pain and fevers.  Up to 1000 mg per dose, up to 4 times per day.  Do not take more than 4000 mg of Tylenol/acetaminophen within 24 hours..  Use naproxen/Aleve for anti-inflammatory pain relief. Use up to 500mg  every 12 hours. Do not take more frequently than this. Do not use other NSAIDs (ibuprofen, Advil) while taking this medication. It is safe to take Tylenol with this.   Please use lidocaine patches and your site of pain.  Apply 1 patch at a time, leave on for 12 hours, then remove for 12 hours.  12 hours on, 12 hours off.  Do not apply more than 1 patch at a time.  If you develop any fevers, uncontrolled pain, please return to the ED.
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
## 2021-01-31 NOTE — ED Notes (Signed)
MD at bedside for assessment

## 2021-02-01 DIAGNOSIS — R2681 Unsteadiness on feet: Secondary | ICD-10-CM | POA: Diagnosis not present

## 2021-02-06 DIAGNOSIS — K8689 Other specified diseases of pancreas: Secondary | ICD-10-CM | POA: Diagnosis not present

## 2021-02-08 ENCOUNTER — Telehealth: Payer: BC Managed Care – PPO | Admitting: General Practice

## 2021-02-08 ENCOUNTER — Ambulatory Visit: Payer: Self-pay

## 2021-02-08 DIAGNOSIS — R269 Unspecified abnormalities of gait and mobility: Secondary | ICD-10-CM

## 2021-02-08 DIAGNOSIS — M79671 Pain in right foot: Secondary | ICD-10-CM

## 2021-02-08 DIAGNOSIS — R2681 Unsteadiness on feet: Secondary | ICD-10-CM | POA: Diagnosis not present

## 2021-02-08 DIAGNOSIS — E1140 Type 2 diabetes mellitus with diabetic neuropathy, unspecified: Secondary | ICD-10-CM

## 2021-02-08 DIAGNOSIS — R1013 Epigastric pain: Secondary | ICD-10-CM

## 2021-02-08 DIAGNOSIS — E785 Hyperlipidemia, unspecified: Secondary | ICD-10-CM

## 2021-02-08 DIAGNOSIS — G8929 Other chronic pain: Secondary | ICD-10-CM

## 2021-02-08 DIAGNOSIS — Z9181 History of falling: Secondary | ICD-10-CM

## 2021-02-08 DIAGNOSIS — M79672 Pain in left foot: Secondary | ICD-10-CM

## 2021-02-08 DIAGNOSIS — I10 Essential (primary) hypertension: Secondary | ICD-10-CM

## 2021-02-08 DIAGNOSIS — M545 Low back pain, unspecified: Secondary | ICD-10-CM

## 2021-02-08 NOTE — Patient Instructions (Signed)
Visit Information   Goals Addressed             This Visit's Progress    RNCM: Hernia Repair surgery and recovery       12-14-2020: The patient saw the surgeon on 12-12-2020. She is doing well post hernia repair surgery. The patient will follow up with the surgeon in 3 months. The patient states she is recovering well and working with PT for strengthening. Is using her cane. Denies any falls. 02-08-2021: The patient was getting ready to go to PT. The patient denies any new concerns. Will continue to monitor. Follows up with surgeon on 03-13-2021. Completed PT today.  Patient goals: -monitor for fever, chills, sx and sx of infection -call the office for changes in condition, questions, or concerns -keep appointments -monitor for foods that cause increased pain or abdominal issues -take medications as prescribed        Patient verbalizes understanding of instructions provided today and agrees to view in MyChart.   Telephone follow up appointment with care management team member scheduled for: 03-22-2021 at 145 pm  Alto Denver RN, MSN, CCM Community Care Coordinator [place]  [facility] Mobile: [phone]

## 2021-02-08 NOTE — Chronic Care Management (AMB) (Signed)
Care Management    RN Visit Note  02/08/2021 Name: Lisa Galvan MRN: [id number] DOB: 19-Dec-1960  Subjective: Lisa Galvan is a 60 y.o. year old female who is a primary care patient of Lisa Roys, DO. The care management team was consulted for assistance with disease management and care coordination needs.    Engaged with patient by telephone for follow up visit in response to provider referral for case management and/or care coordination services.   Consent to Services:   [name] was given information about Care Management services today including:  Care Management services includes personalized support from designated clinical staff supervised by her physician, including individualized plan of care and coordination with other care providers 24/7 contact phone numbers for assistance for urgent and routine care needs. The patient may stop case management services at any time by phone call to the office staff.  Patient agreed to services and consent obtained.   Assessment: Review of patient past medical history, allergies, medications, health status, including review of consultants reports, laboratory and other test data, was performed as part of comprehensive evaluation and provision of chronic care management services.   SDOH (Social Determinants of Health) assessments and interventions performed:    Care Plan  Allergies  Allergen Reactions   Benazepril Swelling    Face and mouth swelling.   Ivp Dye [Iodinated Diagnostic Agents] Anaphylaxis    Patient unsure of reaction but in case of reaction d/t shellfish allergy   Lidocaine Rash    02-01-2021: Patient had lidocaine patch applied to chest at sight of injury and she experienced a rash, whelping, and swelling. Resolved after removing the patch   Lyrica [Pregabalin] Swelling    Face/ mouth swelled up balloon   Shellfish Allergy Anaphylaxis and Swelling    Betadine on skin is okay   Shellfish-Derived Products Anaphylaxis    Trulicity [Dulaglutide] Swelling    Face and mouth swelled up   Ace Inhibitors Swelling    Outpatient Encounter Medications as of 02/08/2021  Medication Sig   Accu-Chek Softclix Lancets lancets TEST TWICE DAILY   ALLERGY RELIEF 180 MG tablet TAKE 1 TABLET BY MOUTH EVERY DAY (Patient taking differently: Take 180 mg by mouth daily.)   amLODipine (NORVASC) 5 MG tablet Take 5 mg by mouth daily.   aspirin EC 81 MG tablet Take 81 mg by mouth daily. Swallow whole.   atorvastatin (LIPITOR) 20 MG tablet Take 1 tablet (20 mg total) by mouth daily. TAKE 1 TABLET(20 MG) BY MOUTH DAILY (Patient taking differently: Take 20 mg by mouth daily.)   Cholecalciferol (VITAMIN D-3) 5000 units TABS Take 5,000 Units by mouth daily.   DULoxetine (CYMBALTA) 30 MG capsule Take 30 mg by mouth daily. (Take with 95m for a total dose of 961m   DULoxetine (CYMBALTA) 60 MG capsule Take 1 capsule (60 mg total) by mouth daily. (Patient taking differently: Take 60 mg by mouth daily. (Take with 3048mor total dose of 24m86m  empagliflozin (JARDIANCE) 25 MG TABS tablet Take 1 tablet (25 mg total) by mouth daily.   EPINEPHrine 0.3 mg/0.3 mL IJ SOAJ injection Inject 0.3 mg into the muscle as needed for anaphylaxis.   etodolac (LODINE) 400 MG tablet Take 400 mg by mouth 2 (two) times daily as needed (muscle pain).   gabapentin (NEURONTIN) 300 MG capsule Take 1 capsule (300 mg total) by mouth 3 (three) times daily.   hydrochlorothiazide (HYDRODIURIL) 25 MG tablet Take 25 mg by mouth daily.   ketoconazole (  NIZORAL) 2 % cream APPLY TOPICALLY TO THE AFFECTED AREA DAILY (Patient taking differently: Apply 1 application topically daily as needed for irritation.)   lidocaine (LIDODERM) 5 % Place 1 patch onto the skin every 12 (twelve) hours. Remove & Discard patch within 12 hours or as directed by MD   Magnesium 500 MG TABS Take 500 mg by mouth daily.   metFORMIN (GLUCOPHAGE) 500 MG tablet Take 2 tablets (1,000 mg total) by mouth 2 (two)  times daily with a meal. (Patient taking differently: Take 500 mg by mouth 2 (two) times daily with a meal.)   montelukast (SINGULAIR) 10 MG tablet Take 1 tablet (10 mg total) by mouth at bedtime. TAKE 1 TABLET(10 MG) BY MOUTH AT BEDTIME (Patient taking differently: Take 10 mg by mouth at bedtime.)   omeprazole (PRILOSEC) 40 MG capsule TAKE 1 CAPSULE(40 MG) BY MOUTH BID (Patient taking differently: Take 40 mg by mouth in the morning and at bedtime. TAKE 1 CAPSULE(40 MG) BY MOUTH BID)   ondansetron (ZOFRAN ODT) 4 MG disintegrating tablet Take 1-2 tablets (4-8 mg total) by mouth every 8 (eight) hours as needed for nausea or vomiting.   oxyCODONE-acetaminophen (PERCOCET) 5-325 MG tablet Take 1-2 tablets by mouth every 4 (four) hours as needed for severe pain. (Patient not taking: No sig reported)   Pancrelipase, Lip-Prot-Amyl, (ZENPEP) 40000-126000 units CPEP Take 2 capsules with the first bite of each meal and 1 capsule with the first bite of each snack   pimecrolimus (ELIDEL) 1 % cream Apply 1 application topically 2 (two) times daily as needed (irriatation).   potassium chloride SA (KLOR-CON) 10 MEQ tablet Take 3 tablets (30 mEq total) by mouth daily.   sucralfate (CARAFATE) 1 g tablet TAKE 1 TABLET(1 GRAM) BY MOUTH THREE TIMES DAILY (Patient taking differently: Take 1 g by mouth in the morning, at noon, and at bedtime.)   valACYclovir (VALTREX) 1000 MG tablet Take 1 tablet (1,000 mg total) by mouth 2 (two) times daily. (Patient taking differently: Take 1,000 mg by mouth 2 (two) times daily as needed (shingles).)   vitamin B-12 (CYANOCOBALAMIN) 1000 MCG tablet Take 1,000 mcg by mouth 2 (two) times daily.   No facility-administered encounter medications on file as of 02/08/2021.    Patient Active Problem List   Diagnosis Date Noted   LVH (left ventricular hypertrophy) due to hypertensive disease, without heart failure 11/23/2020   SOBOE (shortness of breath on exertion) 10/27/2020   Abnormal ECG  10/27/2020   Aortic atherosclerosis ([place]) 07/12/2020   OSA (obstructive sleep apnea) 05/23/2020   Coccygodynia 02/01/2020   Facial swelling 07/21/2019   Acute postoperative pain 06/30/2019   History of allergy to radiographic contrast media 06/02/2019   History of allergy to shellfish 06/02/2019   History of Allergy to iodine 06/02/2019    Class: History of   History of anaphylaxis 06/02/2019   Other spondylosis, sacral and sacrococcygeal region 04/21/2019   Abnormal MRI, cervical spine (02/08/2019) 03/25/2019   Lower extremity weakness (Bilateral) 03/25/2019   Coordination impairment (lower extremity) 03/25/2019   Hypomagnesemia 03/24/2019   Spondylosis without myelopathy or radiculopathy, lumbosacral region 03/24/2019   Lumbar Facet Hypertrophy 03/24/2019   Grade 1 Anterolisthesis of L4/5 03/11/2019   Chronic hip pain (Bilateral) (R>L) 03/11/2019   Chronic sacroiliac joint pain (Bilateral) 03/11/2019   Sacroiliac joint somatic dysfunction (Bilateral) 03/11/2019   Chronic feet pain (Primary Area of Pain) (Bilateral) 03/11/2019   Chronic leg and foot pain (Bilateral) 03/11/2019   Diabetic peripheral neuropathy ([place]) 03/11/2019  Chronic neuropathic pain 03/11/2019   Neurogenic pain 03/11/2019   Chronic musculoskeletal pain 03/11/2019   Osteoarthritis involving multiple joints 03/11/2019   Chronic pain syndrome 03/10/2019   Pharmacologic therapy 03/10/2019   Disorder of skeletal system 03/10/2019   Problems influencing health status 03/10/2019   Chronic low back pain (Third area of Pain) (Bilateral) w/o sciatica 03/10/2019   Chronic lower extremity pain (Secondary area of Pain) (Bilateral) 03/10/2019   Abnormal MRI, lumbar spine (02/08/2019) 03/10/2019   Abnormal findings on diagnostic imaging of other parts of musculoskeletal system 03/10/2019   Essential hypertension 10/06/2018   Cholelithiasis 06/23/2018   Fatty liver 38/93/7342   Helicobacter pylori infection 05/13/2017    Mastalgia in female 04/09/2017   Bilateral leg edema 07/02/2016   DDD (degenerative disc disease), lumbar 12/16/2014   Lumbar facet syndrome (Bilateral) 12/16/2014   Sacroiliac joint dysfunction 12/16/2014   Neuropathy due to secondary diabetes ([place]) 12/16/2014   Vitamin D deficiency 12/13/2014   Morbid obesity ([place]) 12/10/2014   Type 2 diabetes mellitus with diabetic neuropathy, unspecified ([place]) 12/10/2014   Hyperlipidemia 12/10/2014   Gastroesophageal reflux disease without esophagitis 12/10/2014    Conditions to be addressed/monitored: HTN, HLD, DMII, and Chronic pain and falls  Care Plan : RNCM: Diabetes Type 2 (Adult)  Updates made by Vanita Ingles, RN since 02/08/2021 12:00 AM  Completed 02/08/2021   Problem: RNCM: Glycemic Management (Diabetes, Type 2) Resolved 02/08/2021  Priority: Medium     Long-Range Goal: RNCM: Glycemic Management Optimized Completed 02/08/2021  Start Date: 06/07/2020  Expected End Date: 06/07/2021  Recent Progress: On track  Priority: Medium  Note:   Objective: Resolving due to duplicate goal  Lab Results  Component Value Date   HGBA1C 6.7 12/02/2020     Lab Results  Component Value Date   CREATININE 0.70 11/29/2020    No results found for: EGFR Current Barriers:  Knowledge Deficits related to basic Diabetes pathophysiology and self care/management Knowledge Deficits related to medications used for management of diabetes Financial Constraints Limited Social Support Unable to independently manage DM Lacks social connections Does not maintain contact with provider office Does not contact provider office for questions/concerns Case Manager Clinical Goal(s):  Collaboration with Lisa Roys, DO regarding development and update of comprehensive plan of care as evidenced by provider attestation and co-signature Inter-disciplinary care team collaboration (see longitudinal plan of care)  patient will demonstrate improved adherence to  prescribed treatment plan for diabetes self care/management as evidenced by:  daily monitoring and recording of CBG  adherence to ADA/ carb modified diet adherence to prescribed medication regimen Interventions:  Provided education to patient about basic DM disease process Reviewed medications with patient and discussed importance of medication adherence. 08-02-2020: Reviewed her change in Jardiance and the patient is tolerating well. Denies any issues at this time. 10-11-2020: The patient is compliant with medications does have to hold her Metformin and other medications due to having surgery for hernia repair next Tuesday the 28th. 12-14-2020: The patient is compliant with medications. Denies any issues with medications at this time.  Discussed plans with patient for ongoing care management follow up and provided patient with direct contact information for care management team Provided patient with written educational materials related to hypo and hyperglycemia and importance of correct treatment.  10-11-2020: The patient denies any episodes of low blood sugars. Blood sugar this am is 156.  Hemoglobin A1C is elevated from previous hemoglobin A1C.  12-14-2020: The new A1C was under 7.0. Praised the patient  for good work. Denies any highs or lows. States things are stabilizing out with her blood sugar control.  Reviewed scheduled/upcoming provider appointments including: 03-06-2021 at 0940 am Advised patient, providing education and rationale, to check cbg BID and record, calling pcp for findings outside established parameters.  10-11-2020: States blood sugars are better since medication changes. The patient verbalized this am her blood sugar was 156. 12-14-2020: Better blood sugar control now and feels she is doing well with management of her DM. Will continue to monitor.  Referral made to pharmacy team for assistance with help with letter she received about "MIV" and needing documentation that she is not on  insulin and other diagnosis information.  The patient to send e-mail with information to the [place] will forward to CCM team pharmacist. 06-07-2020: The patient is still working on paperwork. Has what she needs from the office. 08-02-2020: The patient has found and insurance company that she likes. She has what she needs currently. 12-14-2020: Denies any new needs at this time with medications or paperwork. Will continue to monitor.   Collaboration with the pcp and CCM pharmacist concerning needing documentation that she needs a letter stating she does not have multiple heart conditions and she is not insulin dependent. Will send in basket message to the pcp and pharmacist for assistance. Did advise the patient that she could copy her last office visit note off of my Chart that had her diagnosis and medications listed. 06-07-2020: The patient is working with pharmacist. 12-14-2020: Ongoing support from pharm D. No new needs at this time.  Review of patient status, including review of consultants reports, relevant laboratory and other test results, and medications completed. In basket message to Dr. Wynetta Emery and clinical staff at [facility] to make a referral to the breast center in [place] for annual mammogram. 08-02-2020: The patient states that she had her mammogram and she is doing well. She says the radiologist feels that it is fatty tissue and recommended that she take Vitamin E. She has not started the Vitamin E yet.  Patient Goals/Self-Care Activities  patient will:  - UNABLE to independently manage DM Attends all scheduled provider appointments Checks blood sugars as prescribed and utilize hyper and hypoglycemia protocol as needed Adheres to prescribed ADA/carb modified. 10-11-2020: The patient is working on dietary changes.  The RNCM sending planning healthy meals document via mail to the patient. 12-14-2020: The patient has had changes in her dietary habits - barriers to adherence to treatment plan  identified - blood glucose monitoring encouraged - blood glucose readings reviewed.  10-11-2020: This am blood sugar is 156 - individualized medical nutrition therapy provided - mutual A1C goal set or reviewed- 10-11-2020: review of goal of hemoglobin A1C of <7.0. 12-14-2020: The last hemoglobin A1C is in goal. The patient is doing well and denies any new concerns with DM at this time. Will continue to monitor for changes.  - resources required to improve adherence to care identified - self-awareness of signs/symptoms of hypo or hyperglycemia encouraged.  12-14-2020: The patient states that she is aware of the sx and sx of hypo/hyperglycemia.  - use of blood glucose monitoring log promoted Follow Up Plan: Telephone follow up appointment with care management team member scheduled for: 02-08-2021 of 0945 am    Care Plan : RNCM: Hypertension (Adult)  Updates made by Vanita Ingles, RN since 02/08/2021 12:00 AM  Completed 02/08/2021   Problem: RNCM: Hypertension (Hypertension) Resolved 02/08/2021  Priority: Medium  Long-Range Goal: RNCM: Hypertension Monitored Completed 02/08/2021  Start Date: 06/07/2020  Expected End Date: 06/07/2021  Recent Progress: On track  Priority: Medium  Note:   Objective: Resolving due to duplicate goal [id number] Last practice recorded BP readings:  BP Readings from Last 3 Encounters:  12/12/20 (!) 133/91  12/06/20 129/88  12/02/20 120/88      Most recent eGFR/CrCl: No results found for: EGFR  No components found for: CRCL Current Barriers:  Knowledge Deficits related to basic understanding of hypertension pathophysiology and self care management Knowledge Deficits related to understanding of medications prescribed for management of hypertension Limited Social Designer, multimedia.  Unable to independently manage HTN Lacks social connections Does not maintain contact with provider office Does not contact provider office for  questions/concerns Case Manager Clinical Goal(s):   patient will verbalize understanding of plan for hypertension management  patient will attend all scheduled medical appointments: 05-23-2020 at 2:20 pm  patient will demonstrate improved adherence to prescribed treatment plan for hypertension as evidenced by taking all medications as prescribed, monitoring and recording blood pressure as directed, adhering to low sodium/DASH diet  patient will demonstrate improved health management independence as evidenced by checking blood pressure as directed and notifying PCP if SBP>160 or DBP > 90, taking all medications as prescribe, and adhering to a low sodium diet as discussed.  patient will verbalize basic understanding of hypertension disease process and self health management plan as evidenced by compliance with the plan of care, taking medications as ordered, and working with the CCM team to manage health and well being Interventions:  Collaboration with Lisa Roys, DO regarding development and update of comprehensive plan of care as evidenced by provider attestation and co-signature Inter-disciplinary care team collaboration (see longitudinal plan of care) UNABLE to independently:manage HTN Evaluation of current treatment plan related to hypertension self management and patient's adherence to plan as established by provider. 10-11-2020: The patient states her blood pressure is doing good. Denies any lows. The patient states that she takes it sometimes at home. 12-14-2020: The patient is doing well with her blood pressures at this time. Little high at most recent provider visit but usually WNL. Denies any headaches or other issues with blood pressures. Will continue to monitor.  Provided education to patient re: stroke prevention, s/s of heart attack and stroke, DASH diet, complications of uncontrolled blood pressure. 06-07-2020: Review of the benefits of a heart healthy/ADA diet. Information sent to the  patient via mail. 08-02-2020: The patient received information is looking forward to the fresh fruits and vegetables from the garden. Does not have an appetite and this has been over the last month. She has lost weight but has not weighed lately. Denies issues with dehydration. 10-11-2020: The patient states that she had diarrhea and vomiting over a week ago. She had labwork for upcoming surgery next week and her potassium level was 2.4. She was told to go to the ER. She went to the ER and had potassium replacement and also a script for potassium. She is eating and doing better. 8-24-202: Review of heart healthy/ADA diet. Discussed how water follows sodium. Patient is staying away from spices foods and other foods that will exacerbate her recent hernia surgery. She is cautious of things that may cause her to have some issues with pain, diarrhea, or constipation. Education and support given.  Reviewed medications with patient and discussed importance of compliance. 08-02-2020: The patient is compliant with medications. Is coming by the office today to pick  up a pill box to use at home. Will also provide information on [employer].  The patient stated that she could not afford 165.00 for an EPI pen. [employer] is a program that allows patients to get EPI pen for 25.00. 10-11-2020: the patient is compliant with medications regimen. States she knows which medications to hold for her upcoming surgery. 12-14-2020: The patient is compliant with medications. States that she is not currently taking anything for blood pressure or fluid due to orthostatic hypotension. Will continue to monitor. Education and support given.  Discussed plans with patient for ongoing care management follow up and provided patient with direct contact information for care management team Advised patient, providing education and rationale, to monitor blood pressure daily and record, calling PCP for findings outside established parameters.  Reviewed  scheduled/upcoming provider appointments including: 03-06-2021 at 0940 am Patient Goals/Self-Care Activities  patient will:  - UNABLE to independently manage HTN Calls provider office for new concerns, questions, or BP outside discussed parameters Checks BP and records as discussed Follows a low sodium diet/DASH diet - blood pressure trends reviewed - depression screen reviewed - home or ambulatory blood pressure monitoring encouraged Follow Up Plan: Telephone follow up appointment with care management team member scheduled for: 02-08-2021 at 0945 am    Care Plan : RNCM: Management of HLD  Updates made by Vanita Ingles, RN since 02/08/2021 12:00 AM  Completed 02/08/2021   Problem: RNCM: Management of HLD Resolved 02/08/2021  Priority: Medium     Long-Range Goal: RNCM: Managment of HLD Completed 02/08/2021  Start Date: 06/07/2020  Expected End Date: 06/07/2021  Recent Progress: On track  Priority: Medium  Note:   Current Barriers: Resolving due to duplicate goal  Poorly controlled hyperlipidemia, complicated by HTN, DM Current antihyperlipidemic regimen: Lipitor 20 mg QD Most recent lipid panel:  Lab Results  Component Value Date   CHOL 153 09/01/2020   HDL 95 09/01/2020   LDLCALC 42 09/01/2020   TRIG 86 09/01/2020   CHOLHDL 1.4 01/03/2017    ASCVD risk enhancing conditions: age 60 DM, HTN Unable to independently manage HLD Lacks social connections Does not maintain contact with provider office Does not contact provider office for [place]):  patient will work with Consulting civil engineer, providers, and care team towards execution of optimized self-health management plan patient will verbalize understanding of plan for working with the CCM team to meet health and wellness needs  patient will work with [facility], pharmacist and pcp to address needs related to expressed needs for paperwork with documentation about chronic conditions and  medicaitons  patient will attend all scheduled medical appointments: 03-06-2021 at 0940 am Interventions: Collaboration with Lisa Roys, DO regarding development and update of comprehensive plan of care as evidenced by provider attestation and co-signature Inter-disciplinary care team collaboration (see longitudinal plan of care) Medication review performed; medication list updated in electronic medical record.  Inter-disciplinary care team collaboration (see longitudinal plan of care) Referred to pharmacy team for assistance with HLD medication management. 06-07-2020: The patient currently working with the pharm D. 08-02-2020: The patient continue to work with the pharm D. 12-14-2020: The patient is compliant with medications. Denies any new concerns at this time with medications management. Evaluation of current treatment plan related to HLD and patient's adherence to plan as established by provider. 12-14-2020: The patient is compliant with the plan of care. The patient is doing well.  Advised patient to send letter to the [facility] email address  for review and forwarding to the pharmacist Provided education to patient re: printing off last office visit note from 02-18-2020 with pcp as supporting documentation to the expressed needs during the call.  06-07-2020: The patient is still working on getting new insurance. She will let RNCM know if she needs additional assistance. Did ask for supply company that could assist with script she has for a walker with seat and breaks. Provided numbers to medical supply companies in [place] for the patient to call. 08-02-2020: The patient has new insurance.  The patients needs are being met. 12-14-2020: No new needs at this time related to office visit notes or information needed for insurance.  Reviewed medications with patient and discussed compliance. 12-14-2020:  The patient is compliant with her medications denies any needs at this time.  Collaborated with  pcp and pharmacist  regarding needs expressed for documentation needed for insurance purposes. 10-11-2020- this has been completed.  Discussed plans with patient for ongoing care management follow up and provided patient with direct contact information for care management team Reviewed scheduled/upcoming provider appointments including: 03-06-2021 Pharmacy referral for assistance with expressed paperwork for insurance purposes. 08-02-2020 completed Review of SDOH- the patient denies any needs. May be interested in food resources when she has her hernia repair surgery. Patient has information and will let RNCM know if changes are needed.  10-11-2020: the patient is interested in food resources in the county for help with food. Will place a care guide referral for assistance. 12-14-2020: The patient has resources for [facility] and denies any new concerns at this time.    Patient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

## 2021-02-09 ENCOUNTER — Telehealth: Payer: Self-pay

## 2021-02-09 LAB — PANCREATIC ELASTASE, FECAL: Pancreatic Elastase, Fecal: 117 ug Elast./g — ABNORMAL LOW (ref >200)

## 2021-02-09 NOTE — Telephone Encounter (Signed)
Called patient about results she understands her results

## 2021-02-09 NOTE — Telephone Encounter (Signed)
-----   Message from Toney Reil, MD sent at 02/09/2021  3:26 PM EDT ----- Please inform patient that the stool test confirms that she has pancreatic insufficiency.  She should continue taking Zenpep/pancreatic enzymes as instructed.  I will see her for follow-up as scheduled  Rohini Vanga
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
## 2021-02-14 ENCOUNTER — Other Ambulatory Visit: Payer: Self-pay | Admitting: Family Medicine

## 2021-02-14 NOTE — Telephone Encounter (Signed)
Rx 12/02/20 #90 2RF- too soon Requested Prescriptions  Pending Prescriptions Disp Refills  . gabapentin (NEURONTIN) 300 MG capsule [Pharmacy Med Name: GABAPENTIN 300MG  CAPSULES] 90 capsule 2    Sig: TAKE 1 CAPSULE(300 MG) BY MOUTH THREE TIMES DAILY     Neurology: Anticonvulsants - gabapentin Passed - 02/14/2021  8:35 AM      Passed - Valid encounter within last 12 months    Recent Outpatient Visits          2 months ago Diabetic peripheral neuropathy (HCC)   [facility] [place], Megan P, DO   2 months ago Non-intractable vomiting with nausea, unspecified vomiting type   [facility], Megan P, DO   3 months ago Epigastric pain   [facility] [place], Megan P, DO   5 months ago Mouth swelling   [facility] [place], Lauren A, NP   5 months ago Essential hypertension   [facility] [place], Simms, DO      Future Appointments            In 2 weeks [place], Laural Benes, DO [facility], [facility]   In 3 weeks [place], [employer], MD [facility] [place]

## 2021-02-15 ENCOUNTER — Other Ambulatory Visit: Payer: Self-pay

## 2021-02-15 ENCOUNTER — Other Ambulatory Visit: Payer: Self-pay | Admitting: Family Medicine

## 2021-02-15 ENCOUNTER — Ambulatory Visit: Payer: BC Managed Care – PPO | Attending: Pain Medicine | Admitting: Pain Medicine

## 2021-02-15 DIAGNOSIS — M79671 Pain in right foot: Secondary | ICD-10-CM

## 2021-02-15 DIAGNOSIS — M79605 Pain in left leg: Secondary | ICD-10-CM

## 2021-02-15 DIAGNOSIS — M79604 Pain in right leg: Secondary | ICD-10-CM | POA: Diagnosis not present

## 2021-02-15 DIAGNOSIS — M545 Low back pain, unspecified: Secondary | ICD-10-CM

## 2021-02-15 DIAGNOSIS — M79672 Pain in left foot: Secondary | ICD-10-CM

## 2021-02-15 DIAGNOSIS — Z79899 Other long term (current) drug therapy: Secondary | ICD-10-CM

## 2021-02-15 DIAGNOSIS — G894 Chronic pain syndrome: Secondary | ICD-10-CM

## 2021-02-15 DIAGNOSIS — W19XXXA Unspecified fall, initial encounter: Secondary | ICD-10-CM | POA: Insufficient documentation

## 2021-02-15 DIAGNOSIS — G8929 Other chronic pain: Secondary | ICD-10-CM

## 2021-02-15 DIAGNOSIS — W19XXXS Unspecified fall, sequela: Secondary | ICD-10-CM

## 2021-02-15 DIAGNOSIS — E1142 Type 2 diabetes mellitus with diabetic polyneuropathy: Secondary | ICD-10-CM

## 2021-02-15 MED ORDER — METHYLPREDNISOLONE 4 MG PO TBPK: ORAL_TABLET | ORAL | 0 refills | Status: AC

## 2021-02-15 NOTE — Patient Instructions (Signed)
Capsaicin topical patch What is this medication? CAPSAICIN (cap SAY sin) is a pain reliever. It is used to treat postherpeticneuralgia (PHN) or diabetic neuropathy pain. This medicine may be used for other purposes; ask your health care provider orpharmacist if you have questions. COMMON BRAND NAME(S): Qutenza What should I tell my care team before I take this medication? They need to know if you have any of these conditions: broken or irritated skin high blood pressure history of heart attack or stroke an unusual or allergic reaction to capsaicin, hot peppers, other medicines, foods, dyes, or preservatives pregnant or trying to get pregnant breast-feeding How should I use this medication? This medicine is for external use only. It is applied by a health careprofessional in a hospital or clinic setting. Talk to your pediatrician regarding the use of this medicine in children.Special care may be needed. Overdosage: If you think you have taken too much of this medicine contact apoison control center or emergency room at once. NOTE: This medicine is only for you. Do not share this medicine with others. What if I miss a dose? This does not apply. What may interact with this medication? Interactions are not expected. Do not use any other skin products on theaffected area without asking your doctor or health care professional. This list may not describe all possible interactions. Give your health care provider a list of all the medicines, herbs, non-prescription drugs, or dietary supplements you use. Also tell them if you smoke, drink alcohol, or use illegaldrugs. Some items may interact with your medicine. What should I watch for while using this medication? Your condition will be monitored carefully while you are receiving thismedicine. Your blood pressure may go up during the procedure. Do not touch the drug patch during treatment. This medicine causes red, burningskin. You may need pain  medicine for during and after the procedure. This medicine can make you more sensitive to heat for a few days after treatment. Be careful in hot showers or baths. Keep out of the sun. Exercisemay make the treated skin feel hotter. Tell your doctor or healthcare professional if your symptoms do not start toget better or if they get worse. What side effects may I notice from receiving this medication? Side effects that you should report to your doctor or health care professionalas soon as possible: allergic reactions like skin rash, itching or hives, swelling of the face, lips, or tongue burning pain, redness that does not go away changes in blood pressure cough or trouble breathing eye irritation skin sores or thinning Side effects that usually do not require medical attention (report to yourdoctor or health care professional if they continue or are bothersome): bruising dry skin nausea unusual body smell This list may not describe all possible side effects. Call your doctor for medical advice about side effects. You may report side effects to FDA at1-800-FDA-1088. Where should I keep my medication? This drug is given in a hospital or clinic and will not be stored at home. NOTE: This sheet is a summary. It may not cover all possible information. If you have questions about this medicine, talk to your doctor, pharmacist, orhealth care provider.  2022 Elsevier/Gold Standard (2018-11-20 01:06:16)  

## 2021-02-15 NOTE — Telephone Encounter (Signed)
Copied from CRM [id number]. Topic: General - Other >> Feb 15, 2021 10:35 AM Jaquita Rector A wrote: Reason for CRM: Patient called in to inform Dr Laural Benes that the pain clinic Dr say that they will not make any changes to her medication only going to give her Steroids so asking for the refill on the gabapentin (NEURONTIN) 300 MG capsule please any questions call  Ph# [phone]

## 2021-02-15 NOTE — Telephone Encounter (Signed)
Routing to advise and for refill.

## 2021-02-16 ENCOUNTER — Telehealth: Payer: Self-pay | Admitting: Family Medicine

## 2021-02-16 MED ORDER — GABAPENTIN 300 MG PO CAPS: 300.0000 mg | ORAL_CAPSULE | Freq: Three times a day (TID) | ORAL | 2 refills | Status: DC

## 2021-02-16 NOTE — Telephone Encounter (Signed)
Refill request was sent over to patient's pharmacy today. Rx was sent 90-day supply with 2 refills.

## 2021-02-16 NOTE — Telephone Encounter (Signed)
Copied from CRM [id number]. Topic: General - Other >> Feb 16, 2021  7:03 AM Jaquita Rector A wrote: Patient called in to let Dr Laural Benes know that as of today she will be completely out of her Gabapentin and need the Rx sent to her pharmacy today please. Can be reached at [phone]

## 2021-02-16 NOTE — Telephone Encounter (Signed)
Copied from CRM #[id number]. Topic: General - Other >> Feb 16, 2021  7:03 AM Davis, Karen A wrote: Patient called in to let Dr Johnson know that as of today she will be completely out of her Gabapentin and need the Rx sent to her pharmacy today please. Can be reached at [phone] 

## 2021-03-06 ENCOUNTER — Other Ambulatory Visit (HOSPITAL_COMMUNITY)
Admission: RE | Admit: 2021-03-06 | Discharge: 2021-03-06 | Disposition: A | Discharge status: Home / Self Care | Payer: BC Managed Care – PPO | Source: Ambulatory Visit | Attending: Family Medicine | Admitting: Family Medicine

## 2021-03-06 ENCOUNTER — Ambulatory Visit (INDEPENDENT_AMBULATORY_CARE_PROVIDER_SITE_OTHER): Payer: BC Managed Care – PPO | Admitting: Family Medicine

## 2021-03-06 ENCOUNTER — Encounter: Payer: Self-pay | Admitting: Family Medicine

## 2021-03-06 ENCOUNTER — Other Ambulatory Visit: Payer: Self-pay

## 2021-03-06 ENCOUNTER — Other Ambulatory Visit: Payer: Self-pay | Admitting: Family Medicine

## 2021-03-06 VITALS — BP 127/86 | HR 83 | Temp 98.5°F | Ht 61.5 in | Wt 154.0 lb

## 2021-03-06 DIAGNOSIS — Z23 Encounter for immunization: Secondary | ICD-10-CM | POA: Diagnosis not present

## 2021-03-06 DIAGNOSIS — Z124 Encounter for screening for malignant neoplasm of cervix: Secondary | ICD-10-CM

## 2021-03-06 DIAGNOSIS — E1142 Type 2 diabetes mellitus with diabetic polyneuropathy: Secondary | ICD-10-CM

## 2021-03-06 DIAGNOSIS — M545 Low back pain, unspecified: Secondary | ICD-10-CM

## 2021-03-06 DIAGNOSIS — E559 Vitamin D deficiency, unspecified: Secondary | ICD-10-CM

## 2021-03-06 DIAGNOSIS — I700 Atherosclerosis of aorta: Secondary | ICD-10-CM

## 2021-03-06 DIAGNOSIS — Z0000 Encounter for general adult medical examination without abnormal findings: Secondary | ICD-10-CM

## 2021-03-06 DIAGNOSIS — M792 Neuralgia and neuritis, unspecified: Secondary | ICD-10-CM

## 2021-03-06 DIAGNOSIS — K219 Gastro-esophageal reflux disease without esophagitis: Secondary | ICD-10-CM

## 2021-03-06 DIAGNOSIS — I10 Essential (primary) hypertension: Secondary | ICD-10-CM | POA: Diagnosis not present

## 2021-03-06 DIAGNOSIS — E785 Hyperlipidemia, unspecified: Secondary | ICD-10-CM

## 2021-03-06 DIAGNOSIS — E1140 Type 2 diabetes mellitus with diabetic neuropathy, unspecified: Secondary | ICD-10-CM

## 2021-03-06 DIAGNOSIS — E1340 Other specified diabetes mellitus with diabetic neuropathy, unspecified: Secondary | ICD-10-CM

## 2021-03-06 LAB — URINALYSIS, ROUTINE W REFLEX MICROSCOPIC
Bilirubin, UA: NEGATIVE
Glucose, UA: NEGATIVE
Ketones, UA: NEGATIVE
Nitrite, UA: NEGATIVE
Protein,UA: NEGATIVE
RBC, UA: NEGATIVE
Specific Gravity, UA: 1.015 (ref 1.005–1.030)
Urobilinogen, Ur: 0.2 mg/dL (ref 0.2–1.0)
pH, UA: 6.5 (ref 5.0–7.5)

## 2021-03-06 LAB — MICROALBUMIN, URINE WAIVED
Creatinine, Urine Waived: 50 mg/dL (ref 10–300)
Microalb, Ur Waived: 10 mg/L (ref 0–19)

## 2021-03-06 LAB — MICROSCOPIC EXAMINATION
Bacteria, UA: NONE SEEN
RBC, Urine: NONE SEEN /hpf (ref 0–2)

## 2021-03-06 LAB — BAYER DCA HB A1C WAIVED: HB A1C (BAYER DCA - WAIVED): 5.8 % — ABNORMAL HIGH (ref 4.8–5.6)

## 2021-03-06 MED ORDER — METFORMIN HCL 500 MG PO TABS: 1000.0000 mg | ORAL_TABLET | Freq: Two times a day (BID) | ORAL | 1 refills | Status: DC

## 2021-03-06 MED ORDER — KETOCONAZOLE 2 % EX CREA: 1.0000 "application " | TOPICAL_CREAM | Freq: Every day | CUTANEOUS | 1 refills | Status: DC | PRN

## 2021-03-06 MED ORDER — OMEPRAZOLE 40 MG PO CPDR
DELAYED_RELEASE_CAPSULE | ORAL | 1 refills | Status: DC
Start: 2021-03-06 — End: 2021-06-06

## 2021-03-06 MED ORDER — POTASSIUM CHLORIDE CRYS ER 10 MEQ PO TBCR: 30.0000 meq | EXTENDED_RELEASE_TABLET | Freq: Every day | ORAL | 1 refills | Status: DC

## 2021-03-06 MED ORDER — SUCRALFATE 1 G PO TABS: 1.0000 g | ORAL_TABLET | Freq: Three times a day (TID) | ORAL | 1 refills | Status: DC

## 2021-03-06 MED ORDER — DULOXETINE HCL 60 MG PO CPEP: 60.0000 mg | ORAL_CAPSULE | Freq: Every day | ORAL | 1 refills | Status: DC

## 2021-03-06 MED ORDER — AMLODIPINE BESYLATE 5 MG PO TABS: 5.0000 mg | ORAL_TABLET | Freq: Every day | ORAL | 1 refills | Status: DC

## 2021-03-06 MED ORDER — MONTELUKAST SODIUM 10 MG PO TABS: 10.0000 mg | ORAL_TABLET | Freq: Every day | ORAL | 1 refills | Status: DC

## 2021-03-06 MED ORDER — ATORVASTATIN CALCIUM 20 MG PO TABS: 20.0000 mg | ORAL_TABLET | Freq: Every day | ORAL | 1 refills | Status: DC

## 2021-03-06 NOTE — Assessment & Plan Note (Signed)
Under good control on current regimen. Continue current regimen. Continue to monitor. Call with any concerns. Refills given. Labs drawn today.   

## 2021-03-06 NOTE — Assessment & Plan Note (Signed)
Under good control on current regimen- stop HCTZ. Continue current regimen. Continue to monitor. Call with any concerns. Refills given. Labs drawn today.

## 2021-03-06 NOTE — Assessment & Plan Note (Signed)
Doing well with A1c of 5.8- will stop jardiance and continue to monitor sugars, has only been taking 500mg  BID on her metformin. If creeping up we will get back up to 1000mg  BID

## 2021-03-06 NOTE — Assessment & Plan Note (Signed)
Will keep BP and cholesterol under good control. Continue to monitor. Call with any concerns.  

## 2021-03-06 NOTE — Assessment & Plan Note (Signed)
Has lost weight. Resolving off problem list.

## 2021-03-06 NOTE — Assessment & Plan Note (Signed)
Rechecking labs today. Await results. Treat as needed.  °
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
## 2021-03-07 ENCOUNTER — Ambulatory Visit
Admission: RE | Admit: 2021-03-07 | Discharge: 2021-03-07 | Disposition: A | Discharge status: Home / Self Care | Payer: BC Managed Care – PPO | Attending: Family Medicine | Admitting: Family Medicine

## 2021-03-07 ENCOUNTER — Encounter: Payer: Self-pay | Admitting: Gastroenterology

## 2021-03-07 ENCOUNTER — Ambulatory Visit (INDEPENDENT_AMBULATORY_CARE_PROVIDER_SITE_OTHER): Payer: BC Managed Care – PPO | Admitting: Gastroenterology

## 2021-03-07 ENCOUNTER — Ambulatory Visit
Admission: RE | Admit: 2021-03-07 | Discharge: 2021-03-07 | Disposition: A | Discharge status: Home / Self Care | Payer: BC Managed Care – PPO | Source: Ambulatory Visit | Attending: Family Medicine | Admitting: Family Medicine

## 2021-03-07 VITALS — BP 141/95 | HR 76 | Temp 97.8°F | Ht 61.0 in | Wt 156.2 lb

## 2021-03-07 DIAGNOSIS — K8681 Exocrine pancreatic insufficiency: Secondary | ICD-10-CM

## 2021-03-07 DIAGNOSIS — M545 Low back pain, unspecified: Secondary | ICD-10-CM | POA: Insufficient documentation

## 2021-03-07 DIAGNOSIS — M4316 Spondylolisthesis, lumbar region: Secondary | ICD-10-CM | POA: Diagnosis not present

## 2021-03-07 LAB — COMPREHENSIVE METABOLIC PANEL
ALT: 7 IU/L (ref 0–32)
AST: 15 IU/L (ref 0–40)
Albumin/Globulin Ratio: 1.5 (ref 1.2–2.2)
Albumin: 3.9 g/dL (ref 3.8–4.8)
Alkaline Phosphatase: 115 IU/L (ref 44–121)
BUN/Creatinine Ratio: 13 (ref 12–28)
BUN: 11 mg/dL (ref 8–27)
Bilirubin Total: 0.5 mg/dL (ref 0.0–1.2)
CO2: 25 mmol/L (ref 20–29)
Calcium: 10 mg/dL (ref 8.7–10.3)
Chloride: 100 mmol/L (ref 96–106)
Creatinine, Ser: 0.84 mg/dL (ref 0.57–1.00)
Globulin, Total: 2.6 g/dL (ref 1.5–4.5)
Glucose: 105 mg/dL — ABNORMAL HIGH (ref 70–99)
Potassium: 3.8 mmol/L (ref 3.5–5.2)
Sodium: 139 mmol/L (ref 134–144)
Total Protein: 6.5 g/dL (ref 6.0–8.5)
eGFR: 79 mL/min/{1.73_m2} (ref >59)

## 2021-03-07 LAB — TSH: TSH: 0.992 u[IU]/mL (ref 0.450–4.500)

## 2021-03-07 LAB — CBC WITH DIFFERENTIAL/PLATELET
Basophils Absolute: 0 10*3/uL (ref 0.0–0.2)
Basos: 0 %
EOS (ABSOLUTE): 0 10*3/uL (ref 0.0–0.4)
Eos: 1 %
Hematocrit: 35.9 % (ref 34.0–46.6)
Hemoglobin: 12.1 g/dL (ref 11.1–15.9)
Immature Grans (Abs): 0 10*3/uL (ref 0.0–0.1)
Immature Granulocytes: 0 %
Lymphocytes Absolute: 1.5 10*3/uL (ref 0.7–3.1)
Lymphs: 41 %
MCH: 29.4 pg (ref 26.6–33.0)
MCHC: 33.7 g/dL (ref 31.5–35.7)
MCV: 87 fL (ref 79–97)
Monocytes Absolute: 0.2 10*3/uL (ref 0.1–0.9)
Monocytes: 6 %
Neutrophils Absolute: 1.9 10*3/uL (ref 1.4–7.0)
Neutrophils: 52 %
Platelets: 276 10*3/uL (ref 150–450)
RBC: 4.12 x10E6/uL (ref 3.77–5.28)
RDW: 14.5 % (ref 11.7–15.4)
WBC: 3.7 10*3/uL (ref 3.4–10.8)

## 2021-03-07 LAB — VITAMIN D 25 HYDROXY (VIT D DEFICIENCY, FRACTURES): Vit D, 25-Hydroxy: 47.3 ng/mL (ref 30.0–100.0)

## 2021-03-07 LAB — LIPID PANEL W/O CHOL/HDL RATIO
Cholesterol, Total: 203 mg/dL — ABNORMAL HIGH (ref 100–199)
HDL: 99 mg/dL (ref >39)
LDL Chol Calc (NIH): 85 mg/dL (ref 0–99)
Triglycerides: 115 mg/dL (ref 0–149)
VLDL Cholesterol Cal: 19 mg/dL (ref 5–40)

## 2021-03-07 NOTE — Progress Notes (Signed)
Arlyss Repress, MD [address]  [address]  [place], [place] [address]  Main: [phone]  Fax: [phone]    Gastroenterology Consultation  Referring Provider:     Dorcas Carrow, DO Primary Care Physician:  Dorcas Carrow, DO Primary Gastroenterologist:  Dr. Arlyss Repress  Reason for Consultation:     Exocrine pancreatic insufficiency        HPI:   Lisa Galvan is a 60 y.o. female referred for consultation & management  by Dr. Laural Benes, Megan P, DO.  She has several years of heartburn, that has worsened over one year, describes it as pain in the chest radiating to right side as well as throat pain, worse laying flat. The pain is worse after eating or drinking something, denies any radiation to the back. She has previously tried Tums, Pepcid, she has been on Protonix for about 4 years was taking at 9 AM and 9 PM not associated with timing of food. She does not find protonix to be effective anymore. She was taking Nexium prior to protonix but changed when it became over-the-counter medication. She reports having sore taste in mouth, belching, feels like something is constantly moving down her throat. She had recent exacerbation and went to the emergency room last month. She has gained a few pounds in last few months. She does not drink sodas, no alcohol no smoking. She can tolerate tea, wine.  She goes to gym 3 times a week but she does consume high carb diet, likes to eat a lot of cheese.  Follow-up visit 05/13/2017: She underwent umbilical hernia repair and is doing well. Since last visit, patient underwent upper endoscopy which revealed H. pylori infection, treated with triple therapy. She denies having reflux symptoms on omeprazole 40 mg daily.  Follow up visit 08/05/17 H Pylori is confirmed eradicated based on H Pylori breath test. She is currently on protonix 40mg  BID for GERD and symptoms under control. She denies any symptoms today.   Follow-up visit 02/10/2019 She  reports for about 1 and half months, she was experiencing severe nausea, reflux, difficulty swallowing, pain during swallowing and nonbloody diarrhea.  And she lost about 20 pounds during that time due to poor p.o. intake from above symptoms.  She is treated for oral thrush.  Her PCP started her on sucralfate, which she is not tolerating it well as they are very big.  She could not afford Carafate suspension.  She is currently taking Protonix 40 mg twice daily.  She also reports having experienced 4-5 episodes of nonbloody watery diarrhea for which she was recommended to take Imodium.  This has resulted in constipation.  She reports abdominal bloating.  She reports that her symptoms of diarrhea nausea resolved about a week ago and picked up 4 pounds since then.  She is trying to eat regular food, but continues to feel like food not going down the esophagus, eating small portions only at this time.  She reports that her her diarrhea has resolved.  Patient did not undergo any stool studies at that time.  Hemoglobin A1c 6.4, TSH normal.  Most recent labs from 09/2018 revealed normal CBC, CMP  Follow-up visit 12/06/2020 Patient is here to discuss about weight loss associated with abdominal bloating, intermittent nonbloody diarrhea and vomiting.  Patient recently underwent incision hernia repair by Dr. 12/08/2020 on 11/29/2020.  She had prior history of ventral hernia repair in 2021.   Patient is also found to have pancreatic atrophy with  fatty pancreas.  She does report worsening of abdominal bloating after a fatty meal.  She reports that her vomiting has subsided since hernia repair.  She feels significantly bloated.  She also reports having gait issues  Follow-up visit 03/07/2021 Patient is diagnosed with exocrine Pancreatic insufficiency, her pancreatic fecal elastase levels were 117.  I started her on Zenpep 40K 2 capsules with each meal and 1 with snack.  Her symptoms of postprandial abdominal bloating, diarrhea,  early satiety have resolved.  Her weight loss has stopped.  Patient does not have any concerns today  Denies significant stress in her life She does not smoke or drink alcohol  GI Procedures: Colonoscopy ~2013 in [place] EGD ~2011, reportedly normal  EGD 03/12/17: - Normal duodenal bulb and second portion of the duodenum. - Normal stomach. Biopsied. - Normal gastroesophageal junction and esophagus. DIAGNOSIS:  A. STOMACH; RANDOM COLD BIOPSY:  - HELICOBACTER PYLORI-ASSOCIATED GASTRITIS, WITH MILD CHRONIC ACTIVE  INFLAMMATION.  - NEGATIVE FOR INTESTINAL METAPLASIA, ATROPHY, DYSPLASIA, AND  MALIGNANCY.  - H. PYLORI BACTERIA ARE SEEN IN HEMATOXYLIN AND EOSIN SECTIONS.  Upper endoscopy 03/30/2019 - Normal duodenal bulb and second portion of the duodenum. - Erythematous mucosa in the antrum. Biopsied. - Normal cardia, gastric fundus, gastric body, incisura and prepyloric region of the stomach. Biopsied. - Esophagogastric landmarks identified. - Normal gastroesophageal junction and esophagus.  DIAGNOSIS:  A.  STOMACH; COLD BIOPSY:  - ANTRAL AND OXYNTIC MUCOSA WITH MILD CHRONIC INACTIVE GASTRITIS.  - OXYNTIC MUCOSA WITH PROTON PUMP INHIBITOR EFFECT.  - NEGATIVE FOR INTESTINAL METAPLASIA, DYSPLASIA, AND MALIGNANCY.   Colonoscopy 03/30/2019 - The examined portion of the ileum was normal. - The entire examined colon is normal. - Non-bleeding external hemorrhoids. - No specimens collected.  Denies family history of esophageal cancer, stomach cancer, colon cancer  Past Medical History:  Diagnosis Date   Allergy    Anemia    Arthritis    spine   Bilateral leg edema    Chest pain 12/15/2014   Chronic sciatica 12/10/2014   Essential hypertension 12/10/2014   Gastroesophageal reflux disease without esophagitis    Mixed hyperlipidemia    Obesity (BMI 30.0-34.9) 12/10/2014   Sleep apnea    waiting for cpap   Type 2 diabetes mellitus without complication (HCC) 12/10/2014    Umbilical hernia     Past Surgical History:  Procedure Laterality Date   COLONOSCOPY WITH PROPOFOL N/A 03/30/2019   Procedure: COLONOSCOPY WITH PROPOFOL;  Surgeon: Toney Reil, MD;  Location: [facility] ENDOSCOPY;  Service: Gastroenterology;  Laterality: N/A;   ESOPHAGOGASTRODUODENOSCOPY (EGD) WITH PROPOFOL N/A 03/12/2017   Procedure: ESOPHAGOGASTRODUODENOSCOPY (EGD) WITH PROPOFOL;  Surgeon: Toney Reil, MD;  Location: [facility] ENDOSCOPY;  Service: Gastroenterology;  Laterality: N/A;   ESOPHAGOGASTRODUODENOSCOPY (EGD) WITH PROPOFOL N/A 03/30/2019   Procedure: ESOPHAGOGASTRODUODENOSCOPY (EGD) WITH PROPOFOL;  Surgeon: Toney Reil, MD;  Location: [facility] ENDOSCOPY;  Service: Gastroenterology;  Laterality: N/A;   HERNIA REPAIR     INCISIONAL HERNIA REPAIR N/A 11/29/2020   Procedure: HERNIA REPAIR INCISIONAL, open;  Surgeon: Leafy Ro, MD;  Location: [facility] ORS;  Service: General;  Laterality: N/A;   ORIF ANKLE FRACTURE Left 07/30/2019   Procedure: OPEN REDUCTION INTERNAL FIXATION (ORIF) OF LEFT BIMALLEOLAR ANKLE FRACTURE;  Surgeon: Signa Kell, MD;  Location: [facility];  Service: Orthopedics;  Laterality: Left;  Diabetic - oral meds   SUPRACERVICAL ABDOMINAL HYSTERECTOMY     UMBILICAL HERNIA REPAIR N/A 05/01/2017   Procedure: HERNIA REPAIR UMBILICAL ADULT;  Surgeon: Satira Mccallum  Clayburn Pert, MD;  Location: [facility] ORS;  Service: General;  Laterality: N/A;   XI ROBOTIC ASSISTED VENTRAL HERNIA N/A 10/06/2019   Procedure: XI ROBOTIC ASSISTED VENTRAL HERNIA;  Surgeon: Leafy Ro, MD;  Location: [facility] ORS;  Service: General;  Laterality: N/A;     Current Outpatient Medications:    Accu-Chek Softclix Lancets lancets, TEST TWICE DAILY, Disp: 100 each, Rfl: 12   ALLERGY RELIEF 180 MG tablet, TAKE 1 TABLET BY MOUTH EVERY DAY (Patient taking differently: Take 180 mg by mouth daily.), Disp: 90 tablet, Rfl: 3   amLODipine (NORVASC) 5 MG tablet, Take 1 tablet (5 mg total) by mouth daily.,  Disp: 90 tablet, Rfl: 1   aspirin EC 81 MG tablet, Take 81 mg by mouth daily. Swallow whole., Disp: , Rfl:    atorvastatin (LIPITOR) 20 MG tablet, Take 1 tablet (20 mg total) by mouth daily. TAKE 1 TABLET(20 MG) BY MOUTH DAILY, Disp: 90 tablet, Rfl: 1   Cholecalciferol (VITAMIN D-3) 5000 units TABS, Take 5,000 Units by mouth daily., Disp: , Rfl:    DULoxetine (CYMBALTA) 30 MG capsule, Take 30 mg by mouth daily. (Take with 60mg  for a total dose of 90mg ), Disp: , Rfl:    DULoxetine (CYMBALTA) 60 MG capsule, Take 1 capsule (60 mg total) by mouth daily. (Take with 30mg  for total dose of 90mg ), Disp: 90 capsule, Rfl: 1   EPINEPHrine 0.3 mg/0.3 mL IJ SOAJ injection, Inject 0.3 mg into the muscle as needed for anaphylaxis., Disp: 1 each, Rfl: 12   etodolac (LODINE) 400 MG tablet, Take 400 mg by mouth 2 (two) times daily as needed (muscle pain)., Disp: , Rfl:    gabapentin (NEURONTIN) 300 MG capsule, Take 1 capsule (300 mg total) by mouth 3 (three) times daily., Disp: 90 capsule, Rfl: 2   ketoconazole (NIZORAL) 2 % cream, Apply 1 application topically daily as needed for irritation., Disp: 60 g, Rfl: 1   Magnesium 500 MG TABS, Take 500 mg by mouth daily., Disp: , Rfl:    metFORMIN (GLUCOPHAGE) 500 MG tablet, Take 2 tablets (1,000 mg total) by mouth 2 (two) times daily with a meal., Disp: 360 tablet, Rfl: 1   montelukast (SINGULAIR) 10 MG tablet, Take 1 tablet (10 mg total) by mouth at bedtime. TAKE 1 TABLET(10 MG) BY MOUTH AT BEDTIME, Disp: 90 tablet, Rfl: 1   omeprazole (PRILOSEC) 40 MG capsule, TAKE 1 CAPSULE(40 MG) BY MOUTH BID, Disp: 180 capsule, Rfl: 1   ondansetron (ZOFRAN ODT) 4 MG disintegrating tablet, Take 1-2 tablets (4-8 mg total) by mouth every 8 (eight) hours as needed for nausea or vomiting., Disp: 60 tablet, Rfl: 6   Pancrelipase, Lip-Prot-Amyl, (ZENPEP) 40000-126000 units CPEP, Take 2 capsules with the first bite of each meal and 1 capsule with the first bite of each snack, Disp: 240 capsule,  Rfl: 0   pimecrolimus (ELIDEL) 1 % cream, Apply 1 application topically 2 (two) times daily as needed (irriatation)., Disp: , Rfl:    potassium chloride (KLOR-CON) 10 MEQ tablet, Take 3 tablets (30 mEq total) by mouth daily., Disp: 90 tablet, Rfl: 1   sucralfate (CARAFATE) 1 g tablet, Take 1 tablet (1 g total) by mouth 4 (four) times daily -  with meals and at bedtime. TAKE 1 TABLET(1 GRAM) BY MOUTH THREE TIMES DAILY, Disp: 270 tablet, Rfl: 1   valACYclovir (VALTREX) 1000 MG tablet, Take 1 tablet (1,000 mg total) by mouth 2 (two) times daily. (Patient taking differently: Take 1,000 mg by mouth 2 (  two) times daily as needed (shingles).), Disp: 20 tablet, Rfl: 0   vitamin B-12 (CYANOCOBALAMIN) 1000 MCG tablet, Take 1,000 mcg by mouth 2 (two) times daily., Disp: , Rfl:    Family History  Problem Relation Age of Onset   Diabetes Mother    Cancer Mother        breast and stomach   Breast cancer Mother 42   Dementia Mother    Heart disease Father    Kidney disease Father        dialysis   Cancer Father        colon   Diabetes Father    Hypertension Sister    Hypertension Brother    Hypertension Daughter    Congestive Heart Failure Maternal Grandmother    Diabetes Maternal Grandmother    Hypertension Maternal Grandmother      Social History   Tobacco Use   Smoking status: Never   Smokeless tobacco: Never  Vaping Use   Vaping Use: Never used  Substance Use Topics   Alcohol use: Yes    Alcohol/week: 3.0 standard drinks    Types: 3 Glasses of wine per week   Drug use: No    Allergies as of 03/07/2021 - Review Complete 03/07/2021  Allergen Reaction Noted   Benazepril Swelling 10/07/2020   Ivp dye [iodinated diagnostic agents] Anaphylaxis 12/02/2018   Lidocaine Rash 02/08/2021   Lyrica [pregabalin] Swelling 07/15/2020   Shellfish allergy Anaphylaxis and Swelling 12/15/2014   Shellfish-derived products Anaphylaxis 12/15/2014   Trulicity [dulaglutide] Swelling 09/13/2020   Ace  inhibitors Swelling 06/05/2016    Review of Systems:    All systems reviewed and negative except where noted in HPI.   Physical Exam:  BP (!) 141/95 (BP Location: Left Arm, Patient Position: Sitting, Cuff Size: Normal)   Pulse 76   Temp 97.8 F (36.6 C) (Oral)   Ht  (1.549 m)   Wt 156 lb 3.2 oz (70.9 kg)   BMI 29.51 kg/m  No LMP recorded. Patient has had a hysterectomy.  General:   Alert,  Well-developed, well-nourished, pleasant and cooperative in NAD Head:  Normocephalic and atraumatic. Eyes:  Sclera clear, no icterus.   Conjunctiva pink. Ears:  Normal auditory acuity. Nose:  No deformity, discharge, or lesions. Mouth:  No deformity or lesions,oropharynx pink & moist. Neck:  Supple; no masses or thyromegaly. Lungs:  Respirations even and unlabored.  Clear throughout to auscultation.   No wheezes, crackles, or rhonchi. No acute distress. Heart:  Regular rate and rhythm; no murmurs, clicks, rubs, or gallops. Abdomen:  Normal bowel sounds.  No bruits.  Soft, nontender, nondistended without masses, hepatosplenomegaly or hernias noted.  No guarding or rebound tenderness.    Msk:  Symmetrical without gross deformities. Good, equal movement & strength bilaterally. Pulses:  Normal pulses noted. Extremities:  No clubbing or edema.  No cyanosis. Neurologic:  Alert and oriented x3;  grossly normal neurologically. Skin:  Intact without significant lesions or rashes. No jaundice. Psych:  Alert and cooperative. Normal mood and affect.  Imaging Studies: Reviewed   Assessment and Plan:   Lisa Galvan is a 60 y.o. black  female with chronic GERD without esophagitis and H Pylori infection, Status post-triple therapy with confirmed eradication by breath test and H. pylori stool antigen.  History of ventral hernia repair in 2021 followed by incisional hernia repair in 11/2020.  Patient had symptoms of intermittent nausea associated with nonbloody diarrhea, abdominal bloating, worse  postprandial, gradual weight loss.  She is  found to have pancreatic atrophy with fatty pancreas.  Pancreatic fecal elastase levels are low which confirmed exocrine pancreatic insufficiency.  Currently her symptoms have resolved on pancreatic enzyme replacement  Exocrine pancreatic insufficiency: Moderate in severity Continue Zenpep 40 K 1 to 2 capsules with each meal and 1 with snack Discussed about DEXA scan which can be ordered by her PCP She has normal vitamin D levels Discussed about low-fat low-carb diet Her diabetes is well under control  Follow up every 6 months  Arlyss Repress, MD

## 2021-03-07 NOTE — Telephone Encounter (Signed)
Requested medication (s) are due for refill today:   Yes  Requested medication (s) are on the active medication list:   Yes  Future visit scheduled:   Yes   Last ordered: 03/06/2021 #90, 1 refill  Returned because a 90 day supply is being requested   Requested Prescriptions  Pending Prescriptions Disp Refills   potassium chloride (KLOR-CON) 10 MEQ tablet [Pharmacy Med Name: POTASSIUM CL MICRO ER TABS] 270 tablet     Sig: TAKE 3 TABLETS(30 MEQ) BY MOUTH DAILY     Endocrinology:  Minerals - Potassium Supplementation Failed - 03/06/2021 12:07 PM      Failed - K in normal range and within 360 days    Potassium  Date Value Ref Range Status  03/06/2021 3.8 3.5 - 5.2 mmol/L Final          Passed - Cr in normal range and within 360 days    Creatinine, Ser  Date Value Ref Range Status  03/06/2021 0.84 0.57 - 1.00 mg/dL Final          Passed - Valid encounter within last 12 months    Recent Outpatient Visits           Yesterday Routine general medical examination at a health care facility   [facility], Megan P, DO   3 months ago Diabetic peripheral neuropathy [facility])   [facility] [place], Megan P, DO   3 months ago Non-intractable vomiting with nausea, unspecified vomiting type   [facility], Megan P, DO   4 months ago Epigastric pain   [facility] [place], Megan P, DO   5 months ago Mouth swelling   [facility] [place], Jake Church, NP       Future Appointments             Today Vanga, Loel Dubonnet, MD [facility]   In 3 months Johnson, Oralia Rud, DO [facility]

## 2021-03-07 NOTE — Patient Instructions (Signed)
Gastroesophageal Reflux Disease, Adult Gastroesophageal reflux (GER) happens when acid from the stomach flows up into the tube that connects the mouth and the stomach (esophagus). Normally, food travels down the esophagus and stays in the stomach to be digested. With GER, food and stomach acid sometimes move back up into the esophagus. You may have a disease called gastroesophageal reflux disease (GERD) if the reflux: Happens often. Causes frequent or very bad symptoms. Causes problems such as damage to the esophagus. When this happens, the esophagus becomes sore and swollen. Over time, GERD can make small holes (ulcers) in the lining of the esophagus. What are the causes? This condition is caused by a problem with the muscle between the esophagus and the stomach. When this muscle is weak or not normal, it does not close properly to keep food and acid from coming back up from the stomach. The muscle can be weak because of: Tobacco use. Pregnancy. Having a certain type of hernia (hiatal hernia). Alcohol use. Certain foods and drinks, such as coffee, chocolate, onions, and peppermint. What increases the risk? Being overweight. Having a disease that affects your connective tissue. Taking NSAIDs, such a ibuprofen. What are the signs or symptoms? Heartburn. Difficult or painful swallowing. The feeling of having a lump in the throat. A bitter taste in the mouth. Bad breath. Having a lot of saliva. Having an upset or bloated stomach. Burping. Chest pain. Different conditions can cause chest pain. Make sure you see your doctor if you have chest pain. Shortness of breath or wheezing. A long-term cough or a cough at night. Wearing away of the surface of teeth (tooth enamel). Weight loss. How is this treated? Making changes to your diet. Taking medicine. Having surgery. Treatment will depend on how bad your symptoms are. Follow these instructions at home: Eating and drinking  Follow a  diet as told by your doctor. You may need to avoid foods and drinks such as: Coffee and tea, with or without caffeine. Drinks that contain alcohol. Energy drinks and sports drinks. Bubbly (carbonated) drinks or sodas. Chocolate and cocoa. Peppermint and mint flavorings. Garlic and onions. Horseradish. Spicy and acidic foods. These include peppers, chili powder, curry powder, vinegar, hot sauces, and BBQ sauce. Citrus fruit juices and citrus fruits, such as oranges, lemons, and limes. Tomato-based foods. These include red sauce, chili, salsa, and pizza with red sauce. Fried and fatty foods. These include donuts, french fries, potato chips, and high-fat dressings. High-fat meats. These include hot dogs, rib eye steak, sausage, ham, and bacon. High-fat dairy items, such as whole milk, butter, and cream cheese. Eat small meals often. Avoid eating large meals. Avoid drinking large amounts of liquid with your meals. Avoid eating meals during the 2-3 hours before bedtime. Avoid lying down right after you eat. Do not exercise right after you eat. Lifestyle  Do not smoke or use any products that contain nicotine or tobacco. If you need help quitting, ask your doctor. Try to lower your stress. If you need help doing this, ask your doctor. If you are overweight, lose an amount of weight that is healthy for you. Ask your doctor about a safe weight loss goal. General instructions Pay attention to any changes in your symptoms. Take over-the-counter and prescription medicines only as told by your doctor. Do not take aspirin, ibuprofen, or other NSAIDs unless your doctor says it is okay. Wear loose clothes. Do not wear anything tight around your waist. Raise (elevate) the head of your bed about 6   inches (15 cm). You may need to use a wedge to do this. Avoid bending over if this makes your symptoms worse. Keep all follow-up visits. Contact a doctor if: You have new symptoms. You lose weight and you  do not know why. You have trouble swallowing or it hurts to swallow. You have wheezing or a cough that keeps happening. You have a hoarse voice. Your symptoms do not get better with treatment. Get help right away if: You have sudden pain in your arms, neck, jaw, teeth, or back. You suddenly feel sweaty, dizzy, or light-headed. You have chest pain or shortness of breath. You vomit and the vomit is green, yellow, or black, or it looks like blood or coffee grounds. You faint. Your poop (stool) is red, bloody, or black. You cannot swallow, drink, or eat. These symptoms may represent a serious problem that is an emergency. Do not wait to see if the symptoms will go away. Get medical help right away. Call your local emergency services (911 in the [place] Do not drive yourself to the hospital. Summary If a person has gastroesophageal reflux disease (GERD), food and stomach acid move back up into the esophagus and cause symptoms or problems such as damage to the esophagus. Treatment will depend on how bad your symptoms are. Follow a diet as told by your doctor. Take all medicines only as told by your doctor. This information is not intended to replace advice given to you by your health care provider. Make sure you discuss any questions you have with your health care provider. Document Revised: 10/19/2019 Document Reviewed: 10/19/2019 Elsevier Patient Education  2022 [employer]  

## 2021-03-10 LAB — CYTOLOGY - PAP
Comment: NEGATIVE
Diagnosis: NEGATIVE
High risk HPV: NEGATIVE

## 2021-03-11 ENCOUNTER — Other Ambulatory Visit: Payer: Self-pay | Admitting: Family Medicine

## 2021-03-11 NOTE — Telephone Encounter (Signed)
Requested medication (s) are due for refill today: no  Requested medication (s) are on the active medication list: yes  Last refill:  03/06/21 #90 1 RF  Future visit scheduled: yes  Notes to clinic:  Wait time to speak to pharmacist was long and could not hold any longer- ---Requested med too soon   Requested Prescriptions  Pending Prescriptions Disp Refills   amLODipine (NORVASC) 5 MG tablet [Pharmacy Med Name: AMLODIPINE BESYLATE 5MG  TABLETS] 90 tablet     Sig: TAKE 1 TABLET BY MOUTH DAILY     Cardiovascular:  Calcium Channel Blockers Failed - 03/11/2021  7:10 AM      Failed - Last BP in normal range    BP Readings from Last 1 Encounters:  03/07/21 (!) 141/95          Passed - Valid encounter within last 6 months    Recent Outpatient Visits           5 days ago Routine general medical examination at a health care facility   [facility] P, DO   3 months ago Diabetic peripheral neuropathy (HCC)   [facility], Megan P, DO   3 months ago Non-intractable vomiting with nausea, unspecified vomiting type   [facility], Megan P, DO   4 months ago Epigastric pain   [facility] [place], Megan P, DO   5 months ago Mouth swelling   [facility] [place], Lauren A, NP       Future Appointments             In 2 months Johnson, Megan P, DO [facility]   In 5 months Vanga, 07-29-2000, MD [facility] [place] [facility]            Refused Prescriptions Disp Refills   hydrochlorothiazide (HYDRODIURIL) 25 MG tablet [Pharmacy Med Name: HYDROCHLOROTHIAZIDE 25MG  TABLETS] 90 tablet     Sig: TAKE 1 TABLET(25 MG) BY MOUTH DAILY     Cardiovascular: Diuretics - Thiazide Failed - 03/11/2021  7:10 AM      Failed - Last BP in normal range    BP Readings from Last 1 Encounters:  03/07/21 (!) 141/95          Passed - Ca in normal range and within 360 days    Calcium  Date Value  Ref Range Status  03/06/2021 10.0 8.7 - 10.3 mg/dL Final   Calcium, Ion  Date Value Ref Range Status  11/29/2020 1.22 1.15 - 1.40 mmol/L Final          Passed - Cr in normal range and within 360 days    Creatinine, Ser  Date Value Ref Range Status  03/06/2021 0.84 0.57 - 1.00 mg/dL Final          Passed - K in normal range and within 360 days    Potassium  Date Value Ref Range Status  03/06/2021 3.8 3.5 - 5.2 mmol/L Final          Passed - Na in normal range and within 360 days    Sodium  Date Value Ref Range Status  03/06/2021 139 134 - 144 mmol/L Final          Passed - Valid encounter within last 6 months    Recent Outpatient Visits           5 days ago Routine general medical examination at a health care facility   [facility],  Megan P, DO   3 months ago Diabetic peripheral neuropathy (HCC)   [facility] [place], Megan P, DO   3 months ago Non-intractable vomiting with nausea, unspecified vomiting type   [facility], [place], DO   4 months ago Epigastric pain   [facility] [place], Megan P, DO   5 months ago Mouth swelling   [facility] [place], Jake Church, NP       Future Appointments             In 2 months Johnson, Oralia Rud, DO [facility]   In 5 months Vanga, Loel Dubonnet, MD [facility]

## 2021-03-11 NOTE — Telephone Encounter (Signed)
Called pharmacy but wait time too long.  Requested Prescriptions  Pending Prescriptions Disp Refills  . amLODipine (NORVASC) 5 MG tablet [Pharmacy Med Name: AMLODIPINE BESYLATE 5MG  TABLETS] 90 tablet     Sig: TAKE 1 TABLET BY MOUTH DAILY     Cardiovascular:  Calcium Channel Blockers Failed - 03/11/2021  7:10 AM      Failed - Last BP in normal range    BP Readings from Last 1 Encounters:  03/07/21 (!) 141/95         Passed - Valid encounter within last 6 months    Recent Outpatient Visits          5 days ago Routine general medical examination at a health care facility   [facility], [facility] P, DO   3 months ago Diabetic peripheral neuropathy [facility])   [facility] [place], Megan P, DO   3 months ago Non-intractable vomiting with nausea, unspecified vomiting type   [facility], Megan P, DO   4 months ago Epigastric pain   [facility] [place], Megan P, DO   5 months ago Mouth swelling   [facility] [place], [place], NP      Future Appointments            In 2 months Johnson, Jake Church, DO [facility]   In 5 months Vanga, Oralia Rud, MD [place] Loel Dubonnet           . hydrochlorothiazide (HYDRODIURIL) 25 MG tablet [Pharmacy Med Name: HYDROCHLOROTHIAZIDE 25MG  TABLETS] 90 tablet     Sig: TAKE 1 TABLET(25 MG) BY MOUTH DAILY     Cardiovascular: Diuretics - Thiazide Failed - 03/11/2021  7:10 AM      Failed - Last BP in normal range    BP Readings from Last 1 Encounters:  03/07/21 (!) 141/95         Passed - Ca in normal range and within 360 days    Calcium  Date Value Ref Range Status  03/06/2021 10.0 8.7 - 10.3 mg/dL Final   Calcium, Ion  Date Value Ref Range Status  11/29/2020 1.22 1.15 - 1.40 mmol/L Final         Passed - Cr in normal range and within 360 days    Creatinine, Ser  Date Value Ref Range Status  03/06/2021 0.84 0.57 - 1.00 mg/dL Final         Passed  - K in normal range and within 360 days    Potassium  Date Value Ref Range Status  03/06/2021 3.8 3.5 - 5.2 mmol/L Final         Passed - Na in normal range and within 360 days    Sodium  Date Value Ref Range Status  03/06/2021 139 134 - 144 mmol/L Final         Passed - Valid encounter within last 6 months    Recent Outpatient Visits          5 days ago Routine general medical examination at a health care facility   [facility], Megan P, DO   3 months ago Diabetic peripheral neuropathy [facility])   [facility] [place], Megan P, DO   3 months ago Non-intractable vomiting with nausea, unspecified vomiting type   [facility], Megan P, DO   4 months ago Epigastric pain   [facility] [place], Megan P, DO   5 months ago Mouth swelling  [facility] [place], Jake Church, NP      Future Appointments            In 2 months Johnson, Oralia Rud, DO [facility]   In 5 months Vanga, Loel Dubonnet, MD [place]

## 2021-03-13 ENCOUNTER — Encounter: Payer: Self-pay | Admitting: Surgery

## 2021-03-13 ENCOUNTER — Other Ambulatory Visit: Payer: Self-pay

## 2021-03-13 ENCOUNTER — Ambulatory Visit: Payer: BC Managed Care – PPO | Admitting: Surgery

## 2021-03-13 VITALS — BP 131/89 | HR 80 | Temp 98.7°F | Ht 62.0 in | Wt 155.4 lb

## 2021-03-13 DIAGNOSIS — R1084 Generalized abdominal pain: Secondary | ICD-10-CM

## 2021-03-13 NOTE — Patient Instructions (Addendum)
If you have any concerns or questions, please feel free to call our office. You have been placed on the recall list and someone will call you in December to schedule your January appointment.

## 2021-03-13 NOTE — Progress Notes (Signed)
Outpatient Surgical Follow Up  03/13/2021  Lisa Galvan is an 60 y.o. female.   Chief Complaint  Patient presents with   Follow-up    Incisional hernia repair 11/29/2020    HPI: Lisa Galvan is a 60 year old female with the with history of ventral hernia requiring.  And then she continued to have left subcostal for hernia that was were required repair.  This was performed of this this year.  Now comes in with some intermittent pain and bulging sensation in the supraumbilical area.  Pain is sharp,  seems to be worsening with Valsalva.  She fell from a standing 3 weeks ago.  PCP ordered x-ray of the back that I have personally reviewed and shared with the patient and there is some degenerative disease without any acute fractures.  Past Medical History:  Diagnosis Date   Allergy    Anemia    Arthritis    spine   Bilateral leg edema    Chest pain 12/15/2014   Chronic sciatica 12/10/2014   Essential hypertension 12/10/2014   Gastroesophageal reflux disease without esophagitis    Mixed hyperlipidemia    Obesity (BMI 30.0-34.9) 12/10/2014   Sleep apnea    waiting for cpap   Type 2 diabetes mellitus without complication (HCC) 12/10/2014   Umbilical hernia     Past Surgical History:  Procedure Laterality Date   COLONOSCOPY WITH PROPOFOL N/A 03/30/2019   Procedure: COLONOSCOPY WITH PROPOFOL;  Surgeon: Toney Reil, MD;  Location: [facility] ENDOSCOPY;  Service: Gastroenterology;  Laterality: N/A;   ESOPHAGOGASTRODUODENOSCOPY (EGD) WITH PROPOFOL N/A 03/12/2017   Procedure: ESOPHAGOGASTRODUODENOSCOPY (EGD) WITH PROPOFOL;  Surgeon: Toney Reil, MD;  Location: [facility] ENDOSCOPY;  Service: Gastroenterology;  Laterality: N/A;   ESOPHAGOGASTRODUODENOSCOPY (EGD) WITH PROPOFOL N/A 03/30/2019   Procedure: ESOPHAGOGASTRODUODENOSCOPY (EGD) WITH PROPOFOL;  Surgeon: Toney Reil, MD;  Location: [facility] ENDOSCOPY;  Service: Gastroenterology;  Laterality: N/A;   HERNIA REPAIR     INCISIONAL HERNIA  REPAIR N/A 11/29/2020   Procedure: HERNIA REPAIR INCISIONAL, open;  Surgeon: Leafy Ro, MD;  Location: [facility] ORS;  Service: General;  Laterality: N/A;   ORIF ANKLE FRACTURE Left 07/30/2019   Procedure: OPEN REDUCTION INTERNAL FIXATION (ORIF) OF LEFT BIMALLEOLAR ANKLE FRACTURE;  Surgeon: Signa Kell, MD;  [facility];  Service: Orthopedics;  Laterality: Left;  Diabetic - oral meds   SUPRACERVICAL ABDOMINAL HYSTERECTOMY     UMBILICAL HERNIA REPAIR N/A 05/01/2017   Procedure: HERNIA REPAIR UMBILICAL ADULT;  Surgeon: Ancil Linsey, MD;  Location: [facility] ORS;  Service: General;  Laterality: N/A;   XI ROBOTIC ASSISTED VENTRAL HERNIA N/A 10/06/2019   Procedure: XI ROBOTIC ASSISTED VENTRAL HERNIA;  Surgeon: Leafy Ro, MD;  Location: [facility] ORS;  Service: General;  Laterality: N/A;    Family History  Problem Relation Age of Onset   Diabetes Mother    Cancer Mother        breast and stomach   Breast cancer Mother 14   Dementia Mother    Heart disease Father    Kidney disease Father        dialysis   Cancer Father        colon   Diabetes Father    Hypertension Sister    Hypertension Brother    Hypertension Daughter    Congestive Heart Failure Maternal Grandmother    Diabetes Maternal Grandmother    Hypertension Maternal Grandmother     Social History:  reports that she has never smoked. She has never used smokeless  tobacco. She reports current alcohol use of about 3.0 standard drinks per week. She reports that she does not use drugs.  Allergies:  Allergies  Allergen Reactions   Benazepril Swelling    Face and mouth swelling.   Ivp Dye [Iodinated Diagnostic Agents] Anaphylaxis    Patient unsure of reaction but in case of reaction d/t shellfish allergy   Lidocaine Rash    02-01-2021: Patient had lidocaine patch applied to chest at sight of injury and she experienced a rash, whelping, and swelling. Resolved after removing the patch   Lyrica [Pregabalin]  Swelling    Face/ mouth swelled up balloon   Shellfish Allergy Anaphylaxis and Swelling    Betadine on skin is okay   Shellfish-Derived Products Anaphylaxis   Trulicity [Dulaglutide] Swelling    Face and mouth swelled up   Ace Inhibitors Swelling    Medications reviewed.    ROS Full ROS performed and is otherwise negative other than what is stated in HPI   BP 131/89   Pulse 80   Temp 98.7 F (37.1 C) (Oral)   Ht 5\' 2"  (1.575 m)   Wt 155 lb 6.4 oz (70.5 kg)   SpO2 98%   BMI 28.42 kg/m   Physical Exam Vitals and nursing note reviewed.  Constitutional:      General: She is not in acute distress.    Appearance: She is normal weight.  Pulmonary:     Effort: Pulmonary effort is normal. No respiratory distress.     Breath sounds: No stridor.  Abdominal:     General: Abdomen is flat. There is no distension.     Palpations: Abdomen is soft. There is no mass.     Tenderness: There is abdominal tenderness. There is no right CVA tenderness, left CVA tenderness, guarding or rebound.     Hernia: No hernia is present.     Comments: Mild tenderness to palpation within the supraumbilical area.  No definitive and not convinced that she has a recurrent hernia.  This is likely postoperative changes.  Skin:    General: Skin is warm.     Capillary Refill: Capillary refill takes less than 2 seconds.  Neurological:     General: No focal deficit present.     Mental Status: She is alert and oriented to person, place, and time.  Psychiatric:        Mood and Affect: Mood normal.        Behavior: Behavior normal.        Thought Content: Thought content normal.        Judgment: Judgment normal.       Assessment/Plan: Anterior abdominal wall pain.  This likely is musculoskeletal in nature.  Might be exacerbated from the trauma.  At this point I am not convinced that she has a current recurrence.  Discussed with patient detail about options of repeating imaging studies versus watchful  waiting and doing the motor exam in a couple months.  Patient wishes to do the latter.  At This time she is doing okay and does not need hospitalization or surgical intervention. Please note that I spent over 30 minutes in this encounter including counseling the patient, personally reviewing imaging studies and performing appropriate documentation  , MD [facility] General Surgeon

## 2021-03-14 ENCOUNTER — Other Ambulatory Visit: Payer: Self-pay | Admitting: Family Medicine

## 2021-03-14 MED ORDER — AMLODIPINE BESYLATE 5 MG PO TABS: 5.0000 mg | ORAL_TABLET | Freq: Every day | ORAL | 1 refills | Status: DC

## 2021-03-22 ENCOUNTER — Telehealth: Payer: Self-pay

## 2021-03-22 ENCOUNTER — Telehealth: Payer: BC Managed Care – PPO

## 2021-03-22 NOTE — Telephone Encounter (Signed)
  Care Management   Follow Up Note   03/22/2021 Name: Lisa Galvan MRN: [id number] DOB: 01/05/1961   Referred by: Dorcas Carrow, DO Reason for referral : Care Coordination (RNCM: Follow up for Chronic Disease Management and Care Coordination Needs )   An unsuccessful telephone outreach was attempted today. The patient was referred to the case management team for assistance with care management and care coordination.   Follow Up Plan: A HIPPA compliant phone message was left for the patient providing contact information and requesting a return call.   Alto Denver RN, MSN, CCM Community Care Coordinator [place]  [facility] Mobile: [phone]

## 2021-04-03 ENCOUNTER — Ambulatory Visit: Payer: BC Managed Care – PPO | Admitting: Licensed Clinical Social Worker
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
## 2021-04-03 NOTE — Patient Instructions (Signed)
Visit Information  Thank you for taking time to visit with me today. Please don't hesitate to contact me if I can be of assistance to you before our next scheduled telephone appointment.  Following are the goals we discussed today:  Patient Self Care Activities:  Attend all scheduled appointments with providers Call provider office for new concerns or questions Utilize strategies discussed  Our next appointment is by telephone on 07/03/21 at 10:00 AM  Please call the care guide team at [phone] if you need to cancel or reschedule your appointment.   If you are experiencing a Mental Health or Behavioral Health Crisis or need someone to talk to, please call the Suicide and Crisis Lifeline: 988 call 911   Patient verbalizes understanding of instructions provided today and agrees to view in MyChart.   Jenel Lucks, MSW, LCSW [facility] Care Management [place]  [facility] [email or web address] Phone [phone] 11:34 AM

## 2021-04-05 ENCOUNTER — Other Ambulatory Visit: Payer: Self-pay | Admitting: Family Medicine

## 2021-04-05 NOTE — Telephone Encounter (Signed)
Requested medication (s) are due for refill today - no  Requested medication (s) are on the active medication list -no  Future visit scheduled -yes  Last refill: unsure  Notes to clinic: Request RF: medication no longer current on medication list  Requested Prescriptions  Pending Prescriptions Disp Refills   hydrochlorothiazide (HYDRODIURIL) 25 MG tablet [Pharmacy Med Name: HYDROCHLOROTHIAZIDE 25MG  TABLETS] 90 tablet 1    Sig: TAKE 1 TABLET(25 MG) BY MOUTH DAILY     Cardiovascular: Diuretics - Thiazide Passed - 04/05/2021  7:08 AM      Passed - Ca in normal range and within 360 days    Calcium  Date Value Ref Range Status  03/06/2021 10.0 8.7 - 10.3 mg/dL Final   Calcium, Ion  Date Value Ref Range Status  11/29/2020 1.22 1.15 - 1.40 mmol/L Final          Passed - Cr in normal range and within 360 days    Creatinine, Ser  Date Value Ref Range Status  03/06/2021 0.84 0.57 - 1.00 mg/dL Final          Passed - K in normal range and within 360 days    Potassium  Date Value Ref Range Status  03/06/2021 3.8 3.5 - 5.2 mmol/L Final          Passed - Na in normal range and within 360 days    Sodium  Date Value Ref Range Status  03/06/2021 139 134 - 144 mmol/L Final          Passed - Last BP in normal range    BP Readings from Last 1 Encounters:  03/13/21 131/89          Passed - Valid encounter within last 6 months    Recent Outpatient Visits           1 month ago Routine general medical examination at a health care facility   [facility], Megan P, DO   4 months ago Diabetic peripheral neuropathy (HCC)   [facility] [place], Megan P, DO   4 months ago Non-intractable vomiting with nausea, unspecified vomiting type   [facility], Megan P, DO   5 months ago Epigastric pain   [facility] [place], Megan P, DO   6 months ago Mouth swelling   [facility] [place], [place] REM[place], NP        Future Appointments             In 2 months Johnson, Megan P, DO [facility]   In 5 months Vanga, 07-29-2000, MD [place] [place]onnet Prescriptions  Pending Prescriptions Disp Refills   hydrochlorothiazide (HYDRODIURIL) 25 MG tablet [Pharmacy Med Name: HYDROCHLOROTHIAZIDE 25MG  TABLETS] 90 tablet 1    Sig: TAKE 1 TABLET(25 MG) BY MOUTH DAILY     Cardiovascular: Diuretics - Thiazide Passed - 04/05/2021  7:08 AM      Passed - Ca in normal range and within 360 days    Calcium  Date Value Ref Range Status  03/06/2021 10.0 8.7 - 10.3 mg/dL Final   Calcium, Ion  Date Value Ref Range Status  11/29/2020 1.22 1.15 - 1.40 mmol/L Final          Passed - Cr in normal range and within 360 days    Creatinine, Ser  Date Value Ref Range Status  03/06/2021 0.84 0.57 -  1.00 mg/dL Final          Passed - K in normal range and within 360 days    Potassium  Date Value Ref Range Status  03/06/2021 3.8 3.5 - 5.2 mmol/L Final          Passed - Na in normal range and within 360 days    Sodium  Date Value Ref Range Status  03/06/2021 139 134 - 144 mmol/L Final          Passed - Last BP in normal range    BP Readings from Last 1 Encounters:  03/13/21 131/89          Passed - Valid encounter within last 6 months    Recent Outpatient Visits           1 month ago Routine general medical examination at a health care facility   [facility] [place] P, DO   4 months ago Diabetic peripheral neuropathy [facility])   [facility] [place], Megan P, DO   4 months ago Non-intractable vomiting with nausea, unspecified vomiting type   [facility], Megan P, DO   5 months ago Epigastric pain   [facility] [place], Megan P, DO   6 months ago Mouth swelling   [facility] [place], Jake Church, NP       Future Appointments             In 2 months Johnson, Oralia Rud, DO  [facility]   In 5 months Vanga, Loel Dubonnet, MD [place] [facility] [employer]

## 2021-04-07 ENCOUNTER — Other Ambulatory Visit: Payer: Self-pay | Admitting: Family Medicine

## 2021-04-07 NOTE — Telephone Encounter (Signed)
Requested Prescriptions  Pending Prescriptions Disp Refills   ALLERGY RELIEF 180 MG tablet [Pharmacy Med Name: FEXOFENADINE 180MG  TABLETS (OTC)] 90 tablet 3    Sig: TAKE 1 TABLET BY MOUTH EVERY DAY     Ear, Nose, and Throat:  Antihistamines Passed - 04/07/2021  7:08 AM      Passed - Valid encounter within last 12 months    Recent Outpatient Visits          1 month ago Routine general medical examination at a health care facility   [facility], [facility] P, DO   4 months ago Diabetic peripheral neuropathy (HCC)   [facility] [place], Megan P, DO   4 months ago Non-intractable vomiting with nausea, unspecified vomiting type   [facility], Megan P, DO   5 months ago Epigastric pain   [facility] [place], Megan P, DO   6 months ago Mouth swelling   [facility] McElwee, SAN REMO, NP      Future Appointments            In 2 months Johnson, Megan P, DO [facility], [facility]   In 5 months Vanga, 07-29-2000, MD [facility] [place]            potassium chloride (KLOR-CON M) 10 MEQ tablet Loel Dubonnet Med Name: POTASSIUM CL MICRO [employer] ER TABS] 270 tablet 0    Sig: TAKE 3 TABLETS(30 MEQ) BY MOUTH DAILY     Endocrinology:  Minerals - Potassium Supplementation Passed - 04/07/2021  7:08 AM      Passed - K in normal range and within 360 days    Potassium  Date Value Ref Range Status  03/06/2021 3.8 3.5 - 5.2 mmol/L Final         Passed - Cr in normal range and within 360 days    Creatinine, Ser  Date Value Ref Range Status  03/06/2021 0.84 0.57 - 1.00 mg/dL Final         Passed - Valid encounter within last 12 months    Recent Outpatient Visits          1 month ago Routine general medical examination at a health care facility   [facility], Megan P, DO   4 months ago Diabetic peripheral neuropathy [facility])   [facility] [place], Megan P, DO   4 months ago  Non-intractable vomiting with nausea, unspecified vomiting type   [facility], Megan P, DO   5 months ago Epigastric pain   [facility] [place], Megan P, DO   6 months ago Mouth swelling   [facility] McElwee, SAN REMO, NP      Future Appointments            In 2 months Johnson, Jake Church, DO [facility], [facility]   In 5 months Vanga, Oralia Rud, MD [facility] 

## 2021-04-08 NOTE — Telephone Encounter (Signed)
Requested Prescriptions  Pending Prescriptions Disp Refills   ALLERGY RELIEF 180 MG tablet [Pharmacy Med Name: FEXOFENADINE 180MG  TABLETS (OTC)] 90 tablet 3    Sig: TAKE 1 TABLET BY MOUTH EVERY DAY     Ear, Nose, and Throat:  Antihistamines Passed - 04/07/2021  7:08 AM      Passed - Valid encounter within last 12 months    Recent Outpatient Visits          1 month ago Routine general medical examination at a health care facility   [facility], Megan P, DO   4 months ago Diabetic peripheral neuropathy (HCC)   [facility] [place], Megan P, DO   4 months ago Non-intractable vomiting with nausea, unspecified vomiting type   [facility], Megan P, DO   5 months ago Epigastric pain   [facility] [place], Megan P, DO   6 months ago Mouth swelling   [facility] Mc[place]wee, SAN REMO, NP      Future Appointments            In 2 months Johnson, Megan P, DO [facility], [facility]   In 5 months Vanga, 07-29-2000, MD [facility] Loel Dubonnet Prescriptions Disp Refills   potassium chloride (KLOR-CON M) 10 MEQ tablet 270 tablet 0    Sig: TAKE 3 TABLETS(30 MEQ) BY MOUTH DAILY     Endocrinology:  Minerals - Potassium Supplementation Passed - 04/07/2021  7:08 AM      Passed - K in normal range and within 360 days    Potassium  Date Value Ref Range Status  03/06/2021 3.8 3.5 - 5.2 mmol/L Final         Passed - Cr in normal range and within 360 days    Creatinine, Ser  Date Value Ref Range Status  03/06/2021 0.84 0.57 - 1.00 mg/dL Final         Passed - Valid encounter within last 12 months    Recent Outpatient Visits          1 month ago Routine general medical examination at a health care facility   [facility], Megan P, DO   4 months ago Diabetic peripheral neuropathy [facility])   [facility] [place], Megan P, DO   4 months ago Non-intractable  vomiting with nausea, unspecified vomiting type   [facility], Megan P, DO   5 months ago Epigastric pain   [facility] [place], Megan P, DO   6 months ago Mouth swelling   [facility] McElwee, SAN REMO, NP      Future Appointments            In 2 months Johnson, Jake Church, DO [facility], [facility]   In 5 months Vanga, Oralia Rud, MD [facility] [place]

## 2021-04-12 ENCOUNTER — Other Ambulatory Visit: Payer: Self-pay | Admitting: Family Medicine

## 2021-04-12 ENCOUNTER — Telehealth: Payer: Self-pay | Admitting: Family Medicine

## 2021-04-12 MED ORDER — FEXOFENADINE HCL 180 MG PO TABS: 180.0000 mg | ORAL_TABLET | Freq: Every day | ORAL | 3 refills | Status: DC

## 2021-04-12 NOTE — Telephone Encounter (Signed)
Pt states that her FEXOFENADINE was only filled for 30 days on 11/19 instead of 90 days, she states that she's out of medication and thinks that her insurance won't pay for the 90 day supply so that's why pharmacy gave her a 30 day supply.  [id number] [place], [place] - [address] AT [facility] [address] [facility]  Phone: [phone] Fax:  [phone]  Seeking a call back [phone]

## 2021-04-12 NOTE — Telephone Encounter (Signed)
Copied from CRM [id number]. Topic: General - Other >> Apr 12, 2021  9:03 AM Jaquita Rector A wrote: Reason for CRM: Patient called in asking to speak to the pharmacist to discuss the prices of some of her medication and how she may be able to get them at a better cost. Please call Ph# [phone]

## 2021-04-12 NOTE — Telephone Encounter (Signed)
Requested Prescriptions  Pending Prescriptions Disp Refills   fexofenadine (ALLERGY RELIEF) 180 MG tablet 90 tablet 3    Sig: Take 1 tablet (180 mg total) by mouth daily.     Ear, Nose, and Throat:  Antihistamines Passed - 04/12/2021  8:48 AM      Passed - Valid encounter within last 12 months    Recent Outpatient Visits          1 month ago Routine general medical examination at a health care facility   [facility] [place] P, DO   4 months ago Diabetic peripheral neuropathy [facility])   [facility] [place], Megan P, DO   4 months ago Non-intractable vomiting with nausea, unspecified vomiting type   [facility], Megan P, DO   5 months ago Epigastric pain   [facility] [place], Megan P, DO   7 months ago Mouth swelling   [facility] [place], Jake Church, NP      Future Appointments            In 1 month Johnson, Oralia Rud, DO [employer], [facility]   In 4 months Vanga, Loel Dubonnet, MD [facility]

## 2021-04-25 ENCOUNTER — Ambulatory Visit: Payer: Self-pay | Admitting: *Deleted

## 2021-04-25 NOTE — Telephone Encounter (Signed)
Reason for Disposition  [1] Age over 64 years AND [2] swelling or bruise  Answer Assessment - Initial Assessment Questions 1. MECHANISM: "How did the injury happen?" For falls, ask: "What height did you fall from?" and "What surface did you fall against?"      Gait is off- patient lost balance and fell backwards- hit dresser drawer 2. ONSET: "When did the injury happen?" (Minutes or hours ago)      12/24 3. NEUROLOGIC SYMPTOMS: "Was there any loss of consciousness?" "Are there any other neurological symptoms?"      Not sure if she blacked out from the fall- does not remember what she hit- patient states she was aware fairly quickly 4. MENTAL STATUS: "Does the person know who they are, who you are, and where they are?"      Patient is aware 5. LOCATION: "What part of the head was hit?"      Back of head near neck- left 6. SCALP APPEARANCE: "What does the scalp look like? Is it bleeding now?" If Yes, ask: "Is it difficult to stop?"      Knot is present 7. SIZE: For cuts, bruises, or swelling, ask: "How large is it?" (e.g., inches or centimeters)      Grape size- was larger-"goose egg" 8. PAIN: "Is there any pain?" If Yes, ask: "How bad is it?"  (e.g., Scale 1-10; or mild, moderate, severe)     Mild- does not hurt without touching 9. TETANUS: For any breaks in the skin, ask: "When was the last tetanus booster?"     na 10. OTHER SYMPTOMS: "Do you have any other symptoms?" (e.g., neck pain, vomiting)       Neck is stiff- painful 11. PREGNANCY: "Is there any chance you are pregnant?" "When was your last menstrual period?"       *No Answer*  Protocols used: Head Injury-A-AH

## 2021-04-25 NOTE — Telephone Encounter (Signed)
°  Chief Complaint: head injury 12/24 Symptoms: knot present- smaller but still present, neck pain/stiffness Frequency: fell 12/24 Pertinent Negatives: Patient denies vision changes, dizziness Disposition: [] ED /[] Urgent Care (no appt availability in office) / [x] Appointment(In office/virtual)/ []  [facility]/ [] Home Care/ [] Refused Recommended Disposition /[] [facility]/ []  Follow-up with PCP Additional Notes: Patient scheduled outside of disposition due to late afternoon call- has appointment in am- patient advised ED for any changes

## 2021-04-26 ENCOUNTER — Ambulatory Visit: Payer: BC Managed Care – PPO | Admitting: Nurse Practitioner

## 2021-04-26 ENCOUNTER — Other Ambulatory Visit: Payer: Self-pay

## 2021-04-26 ENCOUNTER — Ambulatory Visit
Admission: RE | Admit: 2021-04-26 | Discharge: 2021-04-26 | Disposition: A | Discharge status: Home / Self Care | Payer: BC Managed Care – PPO | Source: Ambulatory Visit | Attending: Nurse Practitioner | Admitting: Nurse Practitioner

## 2021-04-26 ENCOUNTER — Encounter: Payer: Self-pay | Admitting: Nurse Practitioner

## 2021-04-26 VITALS — BP 111/75 | HR 84 | Temp 98.4°F | Ht 63.0 in | Wt 164.0 lb

## 2021-04-26 DIAGNOSIS — M542 Cervicalgia: Secondary | ICD-10-CM | POA: Diagnosis not present

## 2021-04-26 DIAGNOSIS — R42 Dizziness and giddiness: Secondary | ICD-10-CM | POA: Diagnosis not present

## 2021-04-26 DIAGNOSIS — W19XXXA Unspecified fall, initial encounter: Secondary | ICD-10-CM

## 2021-04-26 DIAGNOSIS — R519 Headache, unspecified: Secondary | ICD-10-CM | POA: Diagnosis not present

## 2021-04-26 DIAGNOSIS — S0003XA Contusion of scalp, initial encounter: Secondary | ICD-10-CM | POA: Diagnosis not present

## 2021-04-26 MED ORDER — TIZANIDINE HCL 2 MG PO TABS: 2.0000 mg | ORAL_TABLET | Freq: Four times a day (QID) | ORAL | 0 refills | Status: DC | PRN

## 2021-04-26 NOTE — Progress Notes (Signed)
BP 111/75    Pulse 84    Temp 98.4 F (36.9 C) (Oral)    Ht '5\' 3"'  (1.6 m)    Wt 164 lb (74.4 kg)    SpO2 97%    BMI 29.05 kg/m    Subjective:    Patient ID: Lisa Galvan, female    DOB: [birth year], 61 y.o.   MRN: [id number]  HPI: Lisa Galvan is a 61 y.o. female  Chief Complaint  Patient presents with   Fall    Patient states she loss her balance on 04/15/21 and fell and hit her head on what she believes was a door knot (handle). Patient states at first the pain and soreness was so bad she was unable to touch the area or comb her hair. Patient states she did not go to the ER or UC. Patient states the knot has decreased in size, but is still there. Patient states she still has pain and discomfort in the area. Patient states she tried Tylenol, but didn't help. Patient has tried cold compress and it helps the are   Neck Pain   Daughter at bedside with patient.  NECK PAIN  Had a fall on 04/15/21 -- was getting dressed, went to get something out of her drawer -- does not know what happened, but she fell and hit posterior head on handle or night stand of bed, but does not recall.  Has underlying neuropathy and thinks she lost balance -- did not check sugar at time.  Continues to have ongoing neck pain, has a goose egg to posterior left head after event.  Did not go to ER after event, just put ice on area.  She reports losing consciousness briefly, her daughter was in home and came to assist.  Denies any headaches or bleeding after event -- no slurred speech.  Reports her eyes were a little blurry afterwards.  Has a small knot to back of left head from this event.  Is followed by neurology for neuropathy, sees on the 20th next.   Status: stable Treatments attempted: ice and APAP  Relief with NSAIDs?:  No NSAIDs Taken Location:L>R and midline Duration:weeks Severity: 5/10 at worst Quality: dull and aching Frequency: intermittent Radiation: none Aggravating factors: movement Alleviating  factors: ice and APAP Weakness:  no Paresthesias / decreased sensation:  no  Fevers:  no   Relevant past medical, surgical, family and social history reviewed and updated as indicated. Interim medical history since our last visit reviewed. Allergies and medications reviewed and updated.  Review of Systems  Constitutional:  Negative for activity change, appetite change, diaphoresis, fatigue and unexpected weight change.  Respiratory: Negative.    Cardiovascular: Negative.   Musculoskeletal:  Positive for neck pain.  Neurological:  Positive for numbness (at baseline). Negative for dizziness, syncope, facial asymmetry, weakness and light-headedness.  Psychiatric/Behavioral: Negative.     Per HPI unless specifically indicated above     Objective:    BP 111/75    Pulse 84    Temp 98.4 F (36.9 C) (Oral)    Ht '5\' 3"'  (1.6 m)    Wt 164 lb (74.4 kg)    SpO2 97%    BMI 29.05 kg/m   Wt Readings from Last 3 Encounters:  04/26/21 164 lb (74.4 kg)  03/13/21 155 lb 6.4 oz (70.5 kg)  03/07/21 156 lb 3.2 oz (70.9 kg)    Physical Exam Vitals and nursing note reviewed.  Constitutional:      General: She is awake.  She is not in acute distress.    Appearance: She is well-developed and well-groomed. She is not ill-appearing or toxic-appearing.  HENT:     Head: Normocephalic. Contusion (to posterior left occiput -- small in size and tender) present.     Right Ear: Hearing, tympanic membrane, ear canal and external ear normal.     Left Ear: Hearing, tympanic membrane, ear canal and external ear normal.     Nose: Nose normal.     Mouth/Throat:     Mouth: Mucous membranes are moist.  Eyes:     General: Lids are normal.        Right eye: No discharge.        Left eye: No discharge.     Extraocular Movements: Extraocular movements intact.     Conjunctiva/sclera: Conjunctivae normal.     Pupils: Pupils are equal, round, and reactive to light.     Visual Fields: Right eye visual fields normal and  left eye visual fields normal.     Comments: Arcus senilis bilateral eyes.  Neck:     Thyroid: No thyromegaly.     Vascular: No carotid bruit.  Cardiovascular:     Rate and Rhythm: Normal rate and regular rhythm.     Heart sounds: Normal heart sounds. No murmur heard.   No gallop.  Pulmonary:     Effort: Pulmonary effort is normal. No accessory muscle usage or respiratory distress.     Breath sounds: Normal breath sounds.  Abdominal:     General: Bowel sounds are normal.     Palpations: Abdomen is soft. There is no hepatomegaly or splenomegaly.  Musculoskeletal:     Cervical back: Neck supple. No erythema, signs of trauma or rigidity. Pain with movement (mild) and muscular tenderness present. No spinous process tenderness. Decreased range of motion (mild with lateral movement).     Right lower leg: No edema.     Left lower leg: No edema.  Lymphadenopathy:     Cervical: No cervical adenopathy.  Skin:    General: Skin is warm and dry.  Neurological:     Mental Status: She is alert and oriented to person, place, and time.     Cranial Nerves: Cranial nerves 2-12 are intact.     Motor: Motor function is intact.     Gait: Gait is intact.     Deep Tendon Reflexes:     Reflex Scores:      Brachioradialis reflexes are 1+ on the right side and 1+ on the left side.      Patellar reflexes are 1+ on the right side and 1+ on the left side. Psychiatric:        Attention and Perception: Attention normal.        Mood and Affect: Mood normal.        Speech: Speech normal.        Behavior: Behavior normal. Behavior is cooperative.        Thought Content: Thought content normal.   Results for orders placed or performed in visit on 03/06/21  Microscopic Examination   BLD  Result Value Ref Range   WBC, UA 0-5 0 - 5 /hpf   RBC None seen 0 - 2 /hpf   Epithelial Cells (non renal) 0-10 0 - 10 /hpf   Bacteria, UA None seen None seen/Few  Bayer DCA Hb A1c Waived  Result Value Ref Range   HB A1C  (BAYER DCA - WAIVED) 5.8 (H) 4.8 - 5.6 %  CBC  with Differential/Platelet  Result Value Ref Range   WBC 3.7 3.4 - 10.8 x10E3/uL   RBC 4.12 3.77 - 5.28 x10E6/uL   Hemoglobin 12.1 11.1 - 15.9 g/dL   Hematocrit 35.9 34.0 - 46.6 %   MCV 87 79 - 97 fL   MCH 29.4 26.6 - 33.0 pg   MCHC 33.7 31.5 - 35.7 g/dL   RDW 14.5 11.7 - 15.4 %   Platelets 276 150 - 450 x10E3/uL   Neutrophils 52 Not Estab. %   Lymphs 41 Not Estab. %   Monocytes 6 Not Estab. %   Eos 1 Not Estab. %   Basos 0 Not Estab. %   Neutrophils Absolute 1.9 1.4 - 7.0 x10E3/uL   Lymphocytes Absolute 1.5 0.7 - 3.1 x10E3/uL   Monocytes Absolute 0.2 0.1 - 0.9 x10E3/uL   EOS (ABSOLUTE) 0.0 0.0 - 0.4 x10E3/uL   Basophils Absolute 0.0 0.0 - 0.2 x10E3/uL   Immature Granulocytes 0 Not Estab. %   Immature Grans (Abs) 0.0 0.0 - 0.1 x10E3/uL  Comprehensive metabolic panel  Result Value Ref Range   Glucose 105 (H) 70 - 99 mg/dL   BUN 11 8 - 27 mg/dL   Creatinine, Ser 0.84 0.57 - 1.00 mg/dL   eGFR 79 >59 mL/min/1.73   BUN/Creatinine Ratio 13 12 - 28   Sodium 139 134 - 144 mmol/L   Potassium 3.8 3.5 - 5.2 mmol/L   Chloride 100 96 - 106 mmol/L   CO2 25 20 - 29 mmol/L   Calcium 10.0 8.7 - 10.3 mg/dL   Total Protein 6.5 6.0 - 8.5 g/dL   Albumin 3.9 3.8 - 4.8 g/dL   Globulin, Total 2.6 1.5 - 4.5 g/dL   Albumin/Globulin Ratio 1.5 1.2 - 2.2   Bilirubin Total 0.5 0.0 - 1.2 mg/dL   Alkaline Phosphatase 115 44 - 121 IU/L   AST 15 0 - 40 IU/L   ALT 7 0 - 32 IU/L  Lipid Panel w/o Chol/HDL Ratio  Result Value Ref Range   Cholesterol, Total 203 (H) 100 - 199 mg/dL   Triglycerides 115 0 - 149 mg/dL   HDL 99 >39 mg/dL   VLDL Cholesterol Cal 19 5 - 40 mg/dL   LDL Chol Calc (NIH) 85 0 - 99 mg/dL  Urinalysis, Routine w reflex microscopic  Result Value Ref Range   Specific Gravity, UA 1.015 1.005 - 1.030   pH, UA 6.5 5.0 - 7.5   Color, UA Yellow Yellow   Appearance Ur Clear Clear   Leukocytes,UA 1+ (A) Negative   Protein,UA Negative  Negative/Trace   Glucose, UA Negative Negative   Ketones, UA Negative Negative   RBC, UA Negative Negative   Bilirubin, UA Negative Negative   Urobilinogen, Ur 0.2 0.2 - 1.0 mg/dL   Nitrite, UA Negative Negative   Microscopic Examination See below:   TSH  Result Value Ref Range   TSH 0.992 0.450 - 4.500 uIU/mL  Microalbumin, Urine Waived  Result Value Ref Range   Microalb, Ur Waived 10 0 - 19 mg/L   Creatinine, Urine Waived 50 10 - 300 mg/dL   Microalb/Creat Ratio 30-300 (H) <30 mg/g  VITAMIN D 25 Hydroxy (Vit-D Deficiency, Fractures)  Result Value Ref Range   Vit D, 25-Hydroxy 47.3 30.0 - 100.0 ng/mL  Cytology - PAP  Result Value Ref Range   High risk HPV Negative    Adequacy      Satisfactory for evaluation; transformation zone component PRESENT.   Diagnosis      -  Negative for intraepithelial lesion or malignancy (NILM)   Comment Normal Reference Range HPV - Negative       Assessment & Plan:   Problem List Items Addressed This Visit       Other   Dizziness - Primary    With recent fall, lost balance with underlying neuropathy.  Subsequent contusion to posterior left occiput.  Will obtain CT scan head to further evaluate due to fall with trauma and LOC.  Neuro exam stable today and no red flags.  Suspect more related to loss of balance with neuropathy.  Obtain labs today CBC, CMP, TSH, B12.        Relevant Orders   Vitamin B12   CBC with Differential/Platelet   Comprehensive metabolic panel   TSH   CT HEAD WO CONTRAST (5MM)   Fall    Refer to dizziness POC.      Neck pain    Related to recent fall, recommend continue Tylenol as needed at home + alternate heat and ice. Script for Tizanidine sent to use as needed, educated her daughter and her on this medication and use.  Return in 2 weeks for follow-up.       Time: 25 minutes, >50% spent counseling/or care coordination   Follow up plan: Return in about 2 weeks (around 05/10/2021) for Neck pain with Dr. Lenna Sciara or  me.

## 2021-04-26 NOTE — Patient Instructions (Signed)

## 2021-04-26 NOTE — Assessment & Plan Note (Signed)
Related to recent fall, recommend continue Tylenol as needed at home + alternate heat and ice. Script for Tizanidine sent to use as needed, educated her daughter and her on this medication and use.  Return in 2 weeks for follow-up.

## 2021-04-26 NOTE — Assessment & Plan Note (Signed)
Refer to dizziness POC.

## 2021-04-26 NOTE — Progress Notes (Signed)
Contacted via MyChart   Good afternoon [name], I have good news -- the CT of your head showed no acute findings after recent fall.  No stroke or bleed noted.  There was the small hematoma to left scalp which we are aware of.  Any questions? Keep being awesome!!  Thank you for allowing me to participate in your care.  I appreciate you. Kindest regards, Contessa Preuss

## 2021-04-26 NOTE — Assessment & Plan Note (Signed)
With recent fall, lost balance with underlying neuropathy.  Subsequent contusion to posterior left occiput.  Will obtain CT scan head to further evaluate due to fall with trauma and LOC.  Neuro exam stable today and no red flags.  Suspect more related to loss of balance with neuropathy.  Obtain labs today CBC, CMP, TSH, B12.

## 2021-04-27 LAB — CBC WITH DIFFERENTIAL/PLATELET
Basophils Absolute: 0 10*3/uL (ref 0.0–0.2)
Basos: 1 %
EOS (ABSOLUTE): 0.1 10*3/uL (ref 0.0–0.4)
Eos: 1 %
Hematocrit: 36.7 % (ref 34.0–46.6)
Hemoglobin: 12.4 g/dL (ref 11.1–15.9)
Immature Grans (Abs): 0 10*3/uL (ref 0.0–0.1)
Immature Granulocytes: 0 %
Lymphocytes Absolute: 1.9 10*3/uL (ref 0.7–3.1)
Lymphs: 52 %
MCH: 29.1 pg (ref 26.6–33.0)
MCHC: 33.8 g/dL (ref 31.5–35.7)
MCV: 86 fL (ref 79–97)
Monocytes Absolute: 0.2 10*3/uL (ref 0.1–0.9)
Monocytes: 6 %
Neutrophils Absolute: 1.5 10*3/uL (ref 1.4–7.0)
Neutrophils: 40 %
Platelets: 217 10*3/uL (ref 150–450)
RBC: 4.26 x10E6/uL (ref 3.77–5.28)
RDW: 12.8 % (ref 11.7–15.4)
WBC: 3.7 10*3/uL (ref 3.4–10.8)

## 2021-04-27 LAB — VITAMIN B12: Vitamin B-12: 1766 pg/mL — ABNORMAL HIGH (ref 232–1245)

## 2021-04-27 LAB — COMPREHENSIVE METABOLIC PANEL
ALT: 10 IU/L (ref 0–32)
AST: 14 IU/L (ref 0–40)
Albumin/Globulin Ratio: 2.1 (ref 1.2–2.2)
Albumin: 4.6 g/dL (ref 3.8–4.8)
Alkaline Phosphatase: 87 IU/L (ref 44–121)
BUN/Creatinine Ratio: 17 (ref 12–28)
BUN: 13 mg/dL (ref 8–27)
Bilirubin Total: 0.5 mg/dL (ref 0.0–1.2)
CO2: 24 mmol/L (ref 20–29)
Calcium: 10.1 mg/dL (ref 8.7–10.3)
Chloride: 101 mmol/L (ref 96–106)
Creatinine, Ser: 0.78 mg/dL (ref 0.57–1.00)
Globulin, Total: 2.2 g/dL (ref 1.5–4.5)
Glucose: 98 mg/dL (ref 70–99)
Potassium: 3.7 mmol/L (ref 3.5–5.2)
Sodium: 140 mmol/L (ref 134–144)
Total Protein: 6.8 g/dL (ref 6.0–8.5)
eGFR: 86 mL/min/{1.73_m2} (ref >59)

## 2021-04-27 LAB — TSH: TSH: 1.01 u[IU]/mL (ref 0.450–4.500)

## 2021-04-27 NOTE — Progress Notes (Signed)
Contacted via MyChart   Good morning Lisa Galvan, your labs have returned and everything looks nice and normal.  No changes needed.  Any questions? Keep being awesome!!  Thank you for allowing me to participate in your care.  I appreciate you. Kindest regards, [name]

## 2021-05-11 ENCOUNTER — Other Ambulatory Visit: Payer: Self-pay | Admitting: Family Medicine

## 2021-05-11 NOTE — Telephone Encounter (Signed)
Requested Prescriptions  Pending Prescriptions Disp Refills   gabapentin (NEURONTIN) 300 MG capsule [Pharmacy Med Name: GABAPENTIN 300MG  CAPSULES] 90 capsule 2    Sig: TAKE 1 CAPSULE(300 MG) BY MOUTH THREE TIMES DAILY     Neurology: Anticonvulsants - gabapentin Passed - 05/11/2021  3:51 PM      Passed - Valid encounter within last 12 months    Recent Outpatient Visits          2 weeks ago Dizziness   [facility] [place], [place] T, NP   2 months ago Routine general medical examination at a health care facility   [facility], [facility] P, DO   5 months ago Diabetic peripheral neuropathy [facility])   [facility] [place], Megan P, DO   5 months ago Non-intractable vomiting with nausea, unspecified vomiting type   [facility], Megan P, DO   6 months ago Epigastric pain   [facility] [place], [place], DO      Future Appointments            In 5 days [facility], Laural Benes, DO Oral[facility] Rud, [facility]   In 3 weeks [employer], Laural Benes, DO [facility], [facility]   In 3 months Vanga, [employer], MD [place] Loel Dubonnet

## 2021-05-12 ENCOUNTER — Ambulatory Visit: Payer: Self-pay

## 2021-05-12 ENCOUNTER — Telehealth: Payer: BC Managed Care – PPO

## 2021-05-12 DIAGNOSIS — E785 Hyperlipidemia, unspecified: Secondary | ICD-10-CM

## 2021-05-12 DIAGNOSIS — M79671 Pain in right foot: Secondary | ICD-10-CM

## 2021-05-12 DIAGNOSIS — E1140 Type 2 diabetes mellitus with diabetic neuropathy, unspecified: Secondary | ICD-10-CM

## 2021-05-12 DIAGNOSIS — R269 Unspecified abnormalities of gait and mobility: Secondary | ICD-10-CM

## 2021-05-12 DIAGNOSIS — E1142 Type 2 diabetes mellitus with diabetic polyneuropathy: Secondary | ICD-10-CM

## 2021-05-12 DIAGNOSIS — M545 Low back pain, unspecified: Secondary | ICD-10-CM

## 2021-05-12 DIAGNOSIS — I10 Essential (primary) hypertension: Secondary | ICD-10-CM

## 2021-05-12 DIAGNOSIS — R42 Dizziness and giddiness: Secondary | ICD-10-CM

## 2021-05-12 DIAGNOSIS — M542 Cervicalgia: Secondary | ICD-10-CM

## 2021-05-12 DIAGNOSIS — Z9181 History of falling: Secondary | ICD-10-CM
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
## 2021-05-15 ENCOUNTER — Encounter: Payer: Self-pay | Admitting: Surgery

## 2021-05-15 ENCOUNTER — Other Ambulatory Visit: Payer: Self-pay

## 2021-05-15 ENCOUNTER — Ambulatory Visit: Payer: BC Managed Care – PPO | Admitting: Surgery

## 2021-05-15 VITALS — BP 113/80 | HR 90 | Temp 98.3°F | Ht 63.0 in | Wt 165.0 lb

## 2021-05-15 DIAGNOSIS — R1084 Generalized abdominal pain: Secondary | ICD-10-CM | POA: Diagnosis not present

## 2021-05-15 NOTE — Patient Instructions (Addendum)
The exam today did not find any hernias.  Follow-up with our office as needed.  Please call and ask to speak with a nurse if you develop questions or concerns.  You can have your refill of Omeprazole done through your primary care provider. If not we will refill it for you.

## 2021-05-15 NOTE — Progress Notes (Signed)
Outpatient Surgical Follow Up  05/15/2021  Lisa Galvan is an 61 y.o. female.   Chief Complaint  Patient presents with   Routine Post Op    HPI: Lisa Galvan is a 61 year old following after poor hernia site following robotic recurrent ventral hernia.  Last hernia surgery was August 2022.  She is doing okay but fell some fullness around the umbilical area.  She had some transient pain but this is pretty much gone.  No fevers no chills. She has lost balance and fell a month ago.  She is now better.  Past Medical History:  Diagnosis Date   Allergy    Anemia    Arthritis    spine   Bilateral leg edema    Chest pain 12/15/2014   Chronic sciatica 12/10/2014   Essential hypertension 12/10/2014   Gastroesophageal reflux disease without esophagitis    Mixed hyperlipidemia    Obesity (BMI 30.0-34.9) 12/10/2014   Sleep apnea    waiting for cpap   Type 2 diabetes mellitus without complication (HCC) 12/10/2014   Umbilical hernia     Past Surgical History:  Procedure Laterality Date   COLONOSCOPY WITH PROPOFOL N/A 03/30/2019   Procedure: COLONOSCOPY WITH PROPOFOL;  Surgeon: Toney Reil, MD;  Location: [facility] ENDOSCOPY;  Service: Gastroenterology;  Laterality: N/A;   ESOPHAGOGASTRODUODENOSCOPY (EGD) WITH PROPOFOL N/A 03/12/2017   Procedure: ESOPHAGOGASTRODUODENOSCOPY (EGD) WITH PROPOFOL;  Surgeon: Toney Reil, MD;  Location[facility] ENDOSCOPY;  Service: Gastroenterology;  Laterality: N/A;   ESOPHAGOGASTRODUODENOSCOPY (EGD) WITH PROPOFOL N/A 03/30/2019   Procedure: ESOPHAGOGASTRODUODENOSCOPY (EGD) WITH PROPOFOL;  Surgeon: Toney Reil, MD;  Location: [facility] ENDOSCOPY;  Service: Gastroenterology;  Laterality: N/A;   HERNIA REPAIR     INCISIONAL HERNIA REPAIR N/A 11/29/2020   Procedure: HERNIA REPAIR INCISIONAL, open;  Surgeon: Leafy Ro, MD;  Location: [facility] ORS;  Service: General;  Laterality: N/A;   ORIF ANKLE FRACTURE Left 07/30/2019   Procedure: OPEN REDUCTION INTERNAL  FIXATION (ORIF) OF LEFT BIMALLEOLAR ANKLE FRACTURE;  Surgeon: Signa Kell, MD;  Location: [facility];  Service: Orthopedics;  Laterality: Left;  Diabetic - oral meds   SUPRACERVICAL ABDOMINAL HYSTERECTOMY     UMBILICAL HERNIA REPAIR N/A 05/01/2017   Procedure: HERNIA REPAIR UMBILICAL ADULT;  Surgeon: Ancil Linsey, MD;  Location: [facility] ORS;  Service: General;  Laterality: N/A;   XI ROBOTIC ASSISTED VENTRAL HERNIA N/A 10/06/2019   Procedure: XI ROBOTIC ASSISTED VENTRAL HERNIA;  Surgeon: Leafy Ro, MD;  Location: [facility] ORS;  Service: General;  Laterality: N/A;    Family History  Problem Relation Age of Onset   Diabetes Mother    Cancer Mother        breast and stomach   Breast cancer Mother 35   Dementia Mother    Heart disease Father    Kidney disease Father        dialysis   Cancer Father        colon   Diabetes Father    Hypertension Sister    Hypertension Brother    Hypertension Daughter    Congestive Heart Failure Maternal Grandmother    Diabetes Maternal Grandmother    Hypertension Maternal Grandmother     Social History:  reports that she has never smoked. She has never used smokeless tobacco. She reports current alcohol use of about 3.0 standard drinks per week. She reports that she does not use drugs.  Allergies:  Allergies  Allergen Reactions   Benazepril Swelling    Face and mouth swelling.  Ivp Dye [Iodinated Contrast Media] Anaphylaxis    Patient unsure of reaction but in case of reaction d/t shellfish allergy   Lidocaine Rash    02-01-2021: Patient had lidocaine patch applied to chest at sight of injury and she experienced a rash, whelping, and swelling. Resolved after removing the patch   Lyrica [Pregabalin] Swelling    Face/ mouth swelled up balloon   Shellfish Allergy Anaphylaxis and Swelling    Betadine on skin is okay   Shellfish-Derived Products Anaphylaxis   Trulicity [Dulaglutide] Swelling    Face and mouth swelled up   Ace  Inhibitors Swelling    Medications reviewed.    ROS Full ROS performed and is otherwise negative other than what is stated in HPI   BP 113/80    Pulse 90    Temp 98.3 F (36.8 C)    Ht 5\' 3"  (1.6 m)    Wt 165 lb (74.8 kg)    SpO2 99%    BMI 29.23 kg/m   Physical Exam Vitals and nursing note reviewed. Exam conducted with a chaperone present.  Constitutional:      General: She is not in acute distress.    Appearance: Normal appearance. She is normal weight.  Cardiovascular:     Heart sounds: No murmur heard. Pulmonary:     Effort: Pulmonary effort is normal.     Breath sounds: Normal breath sounds.  Abdominal:     General: Abdomen is flat. There is no distension.     Palpations: Abdomen is soft. There is no mass.     Tenderness: There is no abdominal tenderness. There is no guarding or rebound.     Hernia: No hernia is present.     Comments: RoBotic incisions healing well.  There is no ventral hernias but there is likely some soft tissue related to properitoneal fat, left UQ w/o hernias, Prior scars w/o hematomas or infection  Musculoskeletal:        General: No swelling or tenderness. Normal range of motion.  Skin:    General: Skin is warm and dry.     Capillary Refill: Capillary refill takes less than 2 seconds.  Neurological:     General: No focal deficit present.     Mental Status: She is alert and oriented to person, place, and time.  Psychiatric:        Mood and Affect: Mood normal.        Thought Content: Thought content normal.        Judgment: Judgment normal.      Assessment/Plan: postoperative changes after ventral hernia.  No definitive evidence of hernia defects.  No recurrence on the port site hernia. Positive reinforcement given.  Counseling provided.  RTC as needed     , MD [facility] General Surgeon

## 2021-05-16 ENCOUNTER — Other Ambulatory Visit: Payer: Self-pay

## 2021-05-16 ENCOUNTER — Encounter: Payer: Self-pay | Admitting: Family Medicine

## 2021-05-16 ENCOUNTER — Ambulatory Visit: Payer: BC Managed Care – PPO | Admitting: Family Medicine

## 2021-05-16 VITALS — BP 101/69 | HR 94 | Temp 98.1°F | Wt 166.2 lb

## 2021-05-16 DIAGNOSIS — M542 Cervicalgia: Secondary | ICD-10-CM | POA: Diagnosis not present

## 2021-05-16 MED ORDER — TIZANIDINE HCL 2 MG PO TABS: 2.0000 mg | ORAL_TABLET | Freq: Four times a day (QID) | ORAL | 0 refills | Status: DC | PRN

## 2021-05-16 NOTE — Assessment & Plan Note (Signed)
Improving. Continue to monitor. Does not want to do PT or x-rays at this time. Will start stretches. If not better in 2 weeks will continue x-rays and PT.

## 2021-05-16 NOTE — Progress Notes (Signed)
BP 101/69    Pulse 94    Temp 98.1 F (36.7 C) (Oral)    Wt 166 lb 3.2 oz (75.4 kg)    SpO2 93%    BMI 29.44 kg/m    Subjective:    Patient ID: Lisa Galvan, female    DOB: 03-12-1961, 61 y.o.   MRN: [id number]  HPI: Lisa Galvan is a 61 y.o. female  Chief Complaint  Patient presents with   Neck Pain    2 week f/up   NECK PAIN FOLLOW UP Status: better Treatments attempted: rest, ice, heat, APAP, ibuprofen, aleve, muscle relaxer, and HEP  Compliant with recommended treatment: yes Relief with NSAIDs?:  moderate Location: L sided neck pain Duration:weeks Severity: moderate Quality: aching, tight, sore Frequency: constant Radiation: headache Aggravating factors: moving her head Alleviating factors: rest, ice, heat, APAP, and muscle relaxer Weakness:  no Paresthesias / decreased sensation:  yes  Fevers:  no  Relevant past medical, surgical, family and social history reviewed and updated as indicated. Interim medical history since our last visit reviewed. Allergies and medications reviewed and updated.  Review of Systems  Constitutional: Negative.   Respiratory: Negative.    Cardiovascular: Negative.   Musculoskeletal:  Positive for myalgias, neck pain and neck stiffness. Negative for arthralgias, back pain, gait problem and joint swelling.  Neurological: Negative.   Psychiatric/Behavioral: Negative.     Per HPI unless specifically indicated above     Objective:    BP 101/69    Pulse 94    Temp 98.1 F (36.7 C) (Oral)    Wt 166 lb 3.2 oz (75.4 kg)    SpO2 93%    BMI 29.44 kg/m   Wt Readings from Last 3 Encounters:  05/16/21 166 lb 3.2 oz (75.4 kg)  05/15/21 165 lb (74.8 kg)  04/26/21 164 lb (74.4 kg)    Physical Exam Vitals and nursing note reviewed.  Constitutional:      General: She is not in acute distress.    Appearance: Normal appearance. She is not ill-appearing, toxic-appearing or diaphoretic.  HENT:     Head: Normocephalic and atraumatic.     Right  Ear: External ear normal.     Left Ear: External ear normal.     Nose: Nose normal.     Mouth/Throat:     Mouth: Mucous membranes are moist.     Pharynx: Oropharynx is clear.  Eyes:     General: No scleral icterus.       Right eye: No discharge.        Left eye: No discharge.     Extraocular Movements: Extraocular movements intact.     Conjunctiva/sclera: Conjunctivae normal.     Pupils: Pupils are equal, round, and reactive to light.  Cardiovascular:     Rate and Rhythm: Normal rate and regular rhythm.     Pulses: Normal pulses.     Heart sounds: Normal heart sounds. No murmur heard.   No friction rub. No gallop.  Pulmonary:     Effort: Pulmonary effort is normal. No respiratory distress.     Breath sounds: Normal breath sounds. No stridor. No wheezing, rhonchi or rales.  Chest:     Chest wall: No tenderness.  Musculoskeletal:        General: Tenderness present. No swelling, deformity or signs of injury.     Cervical back: Normal range of motion and neck supple.     Right lower leg: No edema.     Left lower leg:  No edema.  Skin:    General: Skin is warm and dry.     Capillary Refill: Capillary refill takes less than 2 seconds.     Coloration: Skin is not jaundiced or pale.     Findings: No bruising, erythema, lesion or rash.  Neurological:     General: No focal deficit present.     Mental Status: She is alert and oriented to person, place, and time. Mental status is at baseline.  Psychiatric:        Mood and Affect: Mood normal.        Behavior: Behavior normal.        Thought Content: Thought content normal.        Judgment: Judgment normal.    Results for orders placed or performed in visit on 04/26/21  Vitamin B12  Result Value Ref Range   Vitamin B-12 1,766 (H) 232 - 1,245 pg/mL  CBC with Differential/Platelet  Result Value Ref Range   WBC 3.7 3.4 - 10.8 x10E3/uL   RBC 4.26 3.77 - 5.28 x10E6/uL   Hemoglobin 12.4 11.1 - 15.9 g/dL   Hematocrit 36.7 34.0 - 46.6 %    MCV 86 79 - 97 fL   MCH 29.1 26.6 - 33.0 pg   MCHC 33.8 31.5 - 35.7 g/dL   RDW 12.8 11.7 - 15.4 %   Platelets 217 150 - 450 x10E3/uL   Neutrophils 40 Not Estab. %   Lymphs 52 Not Estab. %   Monocytes 6 Not Estab. %   Eos 1 Not Estab. %   Basos 1 Not Estab. %   Neutrophils Absolute 1.5 1.4 - 7.0 x10E3/uL   Lymphocytes Absolute 1.9 0.7 - 3.1 x10E3/uL   Monocytes Absolute 0.2 0.1 - 0.9 x10E3/uL   EOS (ABSOLUTE) 0.1 0.0 - 0.4 x10E3/uL   Basophils Absolute 0.0 0.0 - 0.2 x10E3/uL   Immature Granulocytes 0 Not Estab. %   Immature Grans (Abs) 0.0 0.0 - 0.1 x10E3/uL  Comprehensive metabolic panel  Result Value Ref Range   Glucose 98 70 - 99 mg/dL   BUN 13 8 - 27 mg/dL   Creatinine, Ser 0.78 0.57 - 1.00 mg/dL   eGFR 86 >59 mL/min/1.73   BUN/Creatinine Ratio 17 12 - 28   Sodium 140 134 - 144 mmol/L   Potassium 3.7 3.5 - 5.2 mmol/L   Chloride 101 96 - 106 mmol/L   CO2 24 20 - 29 mmol/L   Calcium 10.1 8.7 - 10.3 mg/dL   Total Protein 6.8 6.0 - 8.5 g/dL   Albumin 4.6 3.8 - 4.8 g/dL   Globulin, Total 2.2 1.5 - 4.5 g/dL   Albumin/Globulin Ratio 2.1 1.2 - 2.2   Bilirubin Total 0.5 0.0 - 1.2 mg/dL   Alkaline Phosphatase 87 44 - 121 IU/L   AST 14 0 - 40 IU/L   ALT 10 0 - 32 IU/L  TSH  Result Value Ref Range   TSH 1.010 0.450 - 4.500 uIU/mL      Assessment & Plan:   Problem List Items Addressed This Visit       Other   Neck pain - Primary    Improving. Continue to monitor. Does not want to do PT or x-rays at this time. Will start stretches. If not better in 2 weeks will continue x-rays and PT.         Follow up plan: Return As scheduled.

## 2021-05-29 ENCOUNTER — Telehealth: Payer: BC Managed Care – PPO
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
## 2021-06-06 ENCOUNTER — Encounter: Payer: Self-pay | Admitting: Family Medicine

## 2021-06-06 ENCOUNTER — Other Ambulatory Visit: Payer: Self-pay

## 2021-06-06 ENCOUNTER — Ambulatory Visit: Payer: BC Managed Care – PPO | Admitting: Family Medicine

## 2021-06-06 VITALS — BP 126/85 | HR 76 | Temp 98.1°F | Wt 169.6 lb

## 2021-06-06 DIAGNOSIS — R6889 Other general symptoms and signs: Secondary | ICD-10-CM

## 2021-06-06 DIAGNOSIS — M792 Neuralgia and neuritis, unspecified: Secondary | ICD-10-CM

## 2021-06-06 DIAGNOSIS — I700 Atherosclerosis of aorta: Secondary | ICD-10-CM

## 2021-06-06 DIAGNOSIS — R051 Acute cough: Secondary | ICD-10-CM | POA: Diagnosis not present

## 2021-06-06 DIAGNOSIS — I10 Essential (primary) hypertension: Secondary | ICD-10-CM

## 2021-06-06 DIAGNOSIS — E1140 Type 2 diabetes mellitus with diabetic neuropathy, unspecified: Secondary | ICD-10-CM

## 2021-06-06 DIAGNOSIS — E785 Hyperlipidemia, unspecified: Secondary | ICD-10-CM | POA: Diagnosis not present

## 2021-06-06 LAB — BAYER DCA HB A1C WAIVED: HB A1C (BAYER DCA - WAIVED): 6.1 % — ABNORMAL HIGH (ref 4.8–5.6)

## 2021-06-06 LAB — VERITOR FLU A/B WAIVED
Influenza A: NEGATIVE
Influenza B: NEGATIVE

## 2021-06-06 MED ORDER — GABAPENTIN 300 MG PO CAPS
ORAL_CAPSULE | ORAL | 1 refills | Status: DC
Start: 2021-06-06 — End: 2021-12-04

## 2021-06-06 MED ORDER — METFORMIN HCL 500 MG PO TABS: 1000.0000 mg | ORAL_TABLET | Freq: Two times a day (BID) | ORAL | 1 refills | Status: DC

## 2021-06-06 MED ORDER — HYDROCOD POLI-CHLORPHE POLI ER 10-8 MG/5ML PO SUER: 5.0000 mL | Freq: Two times a day (BID) | ORAL | 0 refills | Status: DC | PRN

## 2021-06-06 MED ORDER — POTASSIUM CHLORIDE CRYS ER 10 MEQ PO TBCR
EXTENDED_RELEASE_TABLET | ORAL | 1 refills | Status: DC
Start: 2021-06-06 — End: 2021-12-04

## 2021-06-06 MED ORDER — ATORVASTATIN CALCIUM 20 MG PO TABS: 20.0000 mg | ORAL_TABLET | Freq: Every day | ORAL | 1 refills | Status: DC

## 2021-06-06 MED ORDER — OMEPRAZOLE 40 MG PO CPDR: DELAYED_RELEASE_CAPSULE | ORAL | 1 refills | Status: DC

## 2021-06-06 MED ORDER — DULOXETINE HCL 60 MG PO CPEP: 60.0000 mg | ORAL_CAPSULE | Freq: Every day | ORAL | 1 refills | Status: DC

## 2021-06-06 MED ORDER — BENZONATATE 200 MG PO CAPS: 200.0000 mg | ORAL_CAPSULE | Freq: Two times a day (BID) | ORAL | 0 refills | Status: DC | PRN

## 2021-06-06 MED ORDER — MONTELUKAST SODIUM 10 MG PO TABS
10.0000 mg | ORAL_TABLET | Freq: Every day | ORAL | 1 refills | Status: DC
Start: 2021-06-06 — End: 2021-12-04

## 2021-06-06 NOTE — Assessment & Plan Note (Signed)
Will keep BP, cholesterol and sugars under good control. Continue to monitor. Call with any concerns.  

## 2021-06-06 NOTE — Assessment & Plan Note (Signed)
Under good control on current regimen. Continue current regimen. Continue to monitor. Call with any concerns. Refills given. Labs drawn today.   

## 2021-06-06 NOTE — Assessment & Plan Note (Addendum)
Under good control on current regimen with A1c of 6.1. Continue current regimen. Continue to monitor. Call with any concerns. Refills given.   

## 2021-06-06 NOTE — Progress Notes (Signed)
BP 126/85    Pulse 76    Temp 98.1 F (36.7 C)    Wt 169 lb 9.6 oz (76.9 kg)    SpO2 100%    BMI 30.04 kg/m    Subjective:    Patient ID: Lisa Galvan, female    DOB: May 22, 1960, 61 y.o.   MRN: [id number]  HPI: Lisa Galvan is a 61 y.o. female  Chief Complaint  Patient presents with   Diabetes   Hypertension   Hyperlipidemia   Cough    Patient states she has been coughing for a couple of days and congestion.    UPPER RESPIRATORY TRACT INFECTION Duration: 2 days Worst symptom: drainage and cough Fever: no Cough: yes Shortness of breath: no Wheezing: yes Chest pain: no Chest tightness: no Chest congestion: yes Nasal congestion: yes Runny nose: yes Post nasal drip: yes Sneezing: no Sore throat: no Swollen glands: no Sinus pressure: no Headache: no Face pain: no Toothache: no Ear pain: no  Ear pressure: no  Eyes red/itching:no Eye drainage/crusting: no  Vomiting: no Rash: no Fatigue: yes Sick contacts: yes Strep contacts: no  Context: stable Recurrent sinusitis: no Relief with OTC cold/cough medications: no  Treatments attempted: none   DIABETES Hypoglycemic episodes:no Polydipsia/polyuria: yes Visual disturbance: no Chest pain: no Paresthesias: no Glucose Monitoring: yes  Accucheck frequency: Daily Taking Insulin?: no Blood Pressure Monitoring: not checking Retinal Examination: Not up to Date Foot Exam: Up to Date Diabetic Education: Completed Pneumovax: Up to Date Influenza: Up to Date Aspirin: yes  HYPERTENSION / HYPERLIPIDEMIA Satisfied with current treatment? yes Duration of hypertension: chronic BP monitoring frequency: not checking BP medication side effects: no Past BP meds: none Duration of hyperlipidemia: chronic Cholesterol medication side effects: no Cholesterol supplements: none Past cholesterol medications: atorvastatin Medication compliance: excellent compliance Aspirin: yes Recent stressors: no Recurrent headaches:  no Visual changes: no Palpitations: no Dyspnea: no Chest pain: no Lower extremity edema: no Dizzy/lightheaded: no  Relevant past medical, surgical, family and social history reviewed and updated as indicated. Interim medical history since our last visit reviewed. Allergies and medications reviewed and updated.  Review of Systems  Constitutional: Negative.   HENT:  Positive for congestion and postnasal drip. Negative for dental problem, drooling, ear discharge, ear pain, facial swelling, hearing loss, mouth sores, nosebleeds, rhinorrhea, sinus pressure, sinus pain, sneezing, sore throat, tinnitus, trouble swallowing and voice change.   Eyes: Negative.   Respiratory:  Positive for cough and wheezing. Negative for apnea, choking, chest tightness, shortness of breath and stridor.   Cardiovascular: Negative.   Gastrointestinal: Negative.   Skin: Negative.   Psychiatric/Behavioral: Negative.     Per HPI unless specifically indicated above     Objective:    BP 126/85    Pulse 76    Temp 98.1 F (36.7 C)    Wt 169 lb 9.6 oz (76.9 kg)    SpO2 100%    BMI 30.04 kg/m   Wt Readings from Last 3 Encounters:  06/06/21 169 lb 9.6 oz (76.9 kg)  05/16/21 166 lb 3.2 oz (75.4 kg)  05/15/21 165 lb (74.8 kg)    Physical Exam Vitals and nursing note reviewed.  Constitutional:      General: She is not in acute distress.    Appearance: Normal appearance. She is not ill-appearing, toxic-appearing or diaphoretic.  HENT:     Head: Normocephalic and atraumatic.     Right Ear: Tympanic membrane, ear canal and external ear normal.     Left Ear:  Tympanic membrane, ear canal and external ear normal.     Nose: Nose normal. No congestion or rhinorrhea.     Mouth/Throat:     Mouth: Mucous membranes are moist.     Pharynx: Oropharynx is clear. No oropharyngeal exudate or posterior oropharyngeal erythema.  Eyes:     General: No scleral icterus.       Right eye: No discharge.        Left eye: No  discharge.     Extraocular Movements: Extraocular movements intact.     Conjunctiva/sclera: Conjunctivae normal.     Pupils: Pupils are equal, round, and reactive to light.  Cardiovascular:     Rate and Rhythm: Normal rate and regular rhythm.     Pulses: Normal pulses.     Heart sounds: Normal heart sounds. No murmur heard.   No friction rub. No gallop.  Pulmonary:     Effort: Pulmonary effort is normal. No respiratory distress.     Breath sounds: Normal breath sounds. No stridor. No wheezing, rhonchi or rales.  Chest:     Chest wall: No tenderness.  Musculoskeletal:        General: Normal range of motion.     Cervical back: Normal range of motion and neck supple.  Skin:    General: Skin is warm and dry.     Capillary Refill: Capillary refill takes less than 2 seconds.     Coloration: Skin is not jaundiced or pale.     Findings: No bruising, erythema, lesion or rash.  Neurological:     General: No focal deficit present.     Mental Status: She is alert and oriented to person, place, and time. Mental status is at baseline.  Psychiatric:        Mood and Affect: Mood normal.        Behavior: Behavior normal.        Thought Content: Thought content normal.        Judgment: Judgment normal.    Results for orders placed or performed in visit on 04/26/21  Vitamin B12  Result Value Ref Range   Vitamin B-12 1,766 (H) 232 - 1,245 pg/mL  CBC with Differential/Platelet  Result Value Ref Range   WBC 3.7 3.4 - 10.8 x10E3/uL   RBC 4.26 3.77 - 5.28 x10E6/uL   Hemoglobin 12.4 11.1 - 15.9 g/dL   Hematocrit 36.7 34.0 - 46.6 %   MCV 86 79 - 97 fL   MCH 29.1 26.6 - 33.0 pg   MCHC 33.8 31.5 - 35.7 g/dL   RDW 12.8 11.7 - 15.4 %   Platelets 217 150 - 450 x10E3/uL   Neutrophils 40 Not Estab. %   Lymphs 52 Not Estab. %   Monocytes 6 Not Estab. %   Eos 1 Not Estab. %   Basos 1 Not Estab. %   Neutrophils Absolute 1.5 1.4 - 7.0 x10E3/uL   Lymphocytes Absolute 1.9 0.7 - 3.1 x10E3/uL    Monocytes Absolute 0.2 0.1 - 0.9 x10E3/uL   EOS (ABSOLUTE) 0.1 0.0 - 0.4 x10E3/uL   Basophils Absolute 0.0 0.0 - 0.2 x10E3/uL   Immature Granulocytes 0 Not Estab. %   Immature Grans (Abs) 0.0 0.0 - 0.1 x10E3/uL  Comprehensive metabolic panel  Result Value Ref Range   Glucose 98 70 - 99 mg/dL   BUN 13 8 - 27 mg/dL   Creatinine, Ser 0.78 0.57 - 1.00 mg/dL   eGFR 86 >59 mL/min/1.73   BUN/Creatinine Ratio 17 12 - 28  Sodium 140 134 - 144 mmol/L   Potassium 3.7 3.5 - 5.2 mmol/L   Chloride 101 96 - 106 mmol/L   CO2 24 20 - 29 mmol/L   Calcium 10.1 8.7 - 10.3 mg/dL   Total Protein 6.8 6.0 - 8.5 g/dL   Albumin 4.6 3.8 - 4.8 g/dL   Globulin, Total 2.2 1.5 - 4.5 g/dL   Albumin/Globulin Ratio 2.1 1.2 - 2.2   Bilirubin Total 0.5 0.0 - 1.2 mg/dL   Alkaline Phosphatase 87 44 - 121 IU/L   AST 14 0 - 40 IU/L   ALT 10 0 - 32 IU/L  TSH  Result Value Ref Range   TSH 1.010 0.450 - 4.500 uIU/mL      Assessment & Plan:   Problem List Items Addressed This Visit       Cardiovascular and Mediastinum   Essential hypertension    Under good control on current regimen. Continue current regimen. Continue to monitor. Call with any concerns. Refills given. Labs drawn today.       Relevant Medications   atorvastatin (LIPITOR) 20 MG tablet   Other Relevant Orders   Comprehensive metabolic panel   CBC with Differential/Platelet   Aortic atherosclerosis ([place])    Will keep BP, cholesterol and sugars under good control. Continue to monitor. Call with any concerns.       Relevant Medications   atorvastatin (LIPITOR) 20 MG tablet   Other Relevant Orders   Comprehensive metabolic panel   CBC with Differential/Platelet   Lipid Panel w/o Chol/HDL Ratio     Endocrine   Type 2 diabetes mellitus with diabetic neuropathy, unspecified ([place]) - Primary    Under good control on current regimen with A1c of 6.1. Continue current regimen. Continue to monitor. Call with any concerns. Refills given.         Relevant Medications   atorvastatin (LIPITOR) 20 MG tablet   metFORMIN (GLUCOPHAGE) 500 MG tablet   Other Relevant Orders   Comprehensive metabolic panel   CBC with Differential/Platelet   Bayer DCA Hb A1c Waived     Other   Neurogenic pain (Chronic)   Relevant Medications   DULoxetine (CYMBALTA) 60 MG capsule   Hyperlipidemia    Under good control on current regimen. Continue current regimen. Continue to monitor. Call with any concerns. Refills given. Labs drawn today.       Relevant Medications   atorvastatin (LIPITOR) 20 MG tablet   Other Relevant Orders   Comprehensive metabolic panel   CBC with Differential/Platelet   Lipid Panel w/o Chol/HDL Ratio   Other Visit Diagnoses     Flu-like symptoms       Flu negative. Will treat with tessalon and tussionex. Call if not getting better or getting worse.    Relevant Orders   Influenza A & B (STAT)        Follow up plan: Return in about 6 months (around 12/04/2021).

## 2021-06-07 LAB — COMPREHENSIVE METABOLIC PANEL
ALT: 10 IU/L (ref 0–32)
AST: 16 IU/L (ref 0–40)
Albumin/Globulin Ratio: 2.2 (ref 1.2–2.2)
Albumin: 4.8 g/dL (ref 3.8–4.8)
Alkaline Phosphatase: 79 IU/L (ref 44–121)
BUN/Creatinine Ratio: 9 — ABNORMAL LOW (ref 12–28)
BUN: 8 mg/dL (ref 8–27)
Bilirubin Total: 0.3 mg/dL (ref 0.0–1.2)
CO2: 27 mmol/L (ref 20–29)
Calcium: 9.9 mg/dL (ref 8.7–10.3)
Chloride: 100 mmol/L (ref 96–106)
Creatinine, Ser: 0.86 mg/dL (ref 0.57–1.00)
Globulin, Total: 2.2 g/dL (ref 1.5–4.5)
Glucose: 102 mg/dL — ABNORMAL HIGH (ref 70–99)
Potassium: 4 mmol/L (ref 3.5–5.2)
Sodium: 142 mmol/L (ref 134–144)
Total Protein: 7 g/dL (ref 6.0–8.5)
eGFR: 76 mL/min/{1.73_m2} (ref >59)

## 2021-06-07 LAB — CBC WITH DIFFERENTIAL/PLATELET
Basophils Absolute: 0 10*3/uL (ref 0.0–0.2)
Basos: 0 %
EOS (ABSOLUTE): 0 10*3/uL (ref 0.0–0.4)
Eos: 1 %
Hematocrit: 38.5 % (ref 34.0–46.6)
Hemoglobin: 12.7 g/dL (ref 11.1–15.9)
Immature Grans (Abs): 0 10*3/uL (ref 0.0–0.1)
Immature Granulocytes: 0 %
Lymphocytes Absolute: 1.7 10*3/uL (ref 0.7–3.1)
Lymphs: 46 %
MCH: 28.3 pg (ref 26.6–33.0)
MCHC: 33 g/dL (ref 31.5–35.7)
MCV: 86 fL (ref 79–97)
Monocytes Absolute: 0.6 10*3/uL (ref 0.1–0.9)
Monocytes: 16 %
Neutrophils Absolute: 1.4 10*3/uL (ref 1.4–7.0)
Neutrophils: 37 %
Platelets: 231 10*3/uL (ref 150–450)
RBC: 4.49 x10E6/uL (ref 3.77–5.28)
RDW: 14.9 % (ref 11.7–15.4)
WBC: 3.7 10*3/uL (ref 3.4–10.8)

## 2021-06-07 LAB — LIPID PANEL W/O CHOL/HDL RATIO
Cholesterol, Total: 203 mg/dL — ABNORMAL HIGH (ref 100–199)
HDL: 125 mg/dL (ref >39)
LDL Chol Calc (NIH): 65 mg/dL (ref 0–99)
Triglycerides: 77 mg/dL (ref 0–149)
VLDL Cholesterol Cal: 13 mg/dL (ref 5–40)

## 2021-06-12 ENCOUNTER — Telehealth: Payer: BC Managed Care – PPO

## 2021-06-12 ENCOUNTER — Telehealth: Payer: Self-pay

## 2021-06-12 DIAGNOSIS — M542 Cervicalgia: Secondary | ICD-10-CM | POA: Diagnosis not present

## 2021-06-12 DIAGNOSIS — R296 Repeated falls: Secondary | ICD-10-CM | POA: Diagnosis not present

## 2021-06-12 DIAGNOSIS — M546 Pain in thoracic spine: Secondary | ICD-10-CM | POA: Diagnosis not present

## 2021-06-12 DIAGNOSIS — G20 Parkinson's disease: Secondary | ICD-10-CM | POA: Diagnosis not present
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
## 2021-06-13 NOTE — Telephone Encounter (Signed)
Pt has upcoming appt with Pam and will review meds at that time.  Thank you, Penne Lash, RMA Care Guide, Embedded Care Coordination [facility]  [place], [place] [address] Direct Dial: [phone] [email or web address]

## 2021-06-14 ENCOUNTER — Telehealth: Payer: Self-pay | Admitting: Family Medicine

## 2021-06-14 NOTE — Telephone Encounter (Signed)
Dennie Bible called n saying there is a discrepancy on how med is refdill only for 30 days, so she says she is still due the refill.

## 2021-06-14 NOTE — Telephone Encounter (Signed)
Refilled 04/12/21 # 90 with 3 refills. Requested Prescriptions  Refused Prescriptions Disp Refills   ALLERGY RELIEF 180 MG tablet [Pharmacy Med Name: FEXOFENADINE 180MG  TABLETS (OTC)] 90 tablet 3    Sig: TAKE 1 TABLET BY MOUTH EVERY DAY     Ear, Nose, and Throat:  Antihistamines Passed - 06/14/2021  9:08 AM      Passed - Valid encounter within last 12 months    Recent Outpatient Visits          1 week ago Type 2 diabetes mellitus with diabetic neuropathy, without long-term current use of insulin (HCC)   [facility] [place], Megan P, DO   4 weeks ago Neck pain   [facility] [place], [place], DO   1 month ago Dizziness   [facility] [place], [place] T, NP   3 months ago Routine general medical examination at a health care facility   [facility], Megan P, DO   6 months ago Diabetic peripheral neuropathy [facility])   [facility] [place], [place], DO      Future Appointments            In 2 months V[place], Oralia Rud, MD [facility] [place]   In 5 months Lo[place], Laural Benes, DO Oralia Rud, [facility]

## 2021-06-15 NOTE — Telephone Encounter (Signed)
Spoke with [employer] and was informed that the patient's insurance no longer covers the Allergy Relief (Fexofenadine) due to patient being able to purchase over the counter. Patient was notified and states she was unaware and I informed her that if she has any other questions. She would have to reach out to her insurance. Patient verbalized understanding and has no further questions.

## 2021-06-16 IMAGING — US US BREAST*L* LIMITED INC AXILLA
1 series · 3 of 3 positions shown · non-contrast
Comparison: Previous exam(s).

CLINICAL DATA: 59-year-old presenting with BILATERAL focal pain in
the UPPER breasts.

EXAM:
DIGITAL DIAGNOSTIC BILATERAL MAMMOGRAM WITH TOMOSYNTHESIS AND CAD;
ULTRASOUND RIGHT BREAST LIMITED; ULTRASOUND LEFT BREAST LIMITED
TECHNIQUE: Bilateral digital diagnostic mammography and breast tomosynthesis
was performed. The images were evaluated with computer-aided
detection.; Targeted ultrasound examination of the right breast was
performed; Targeted ultrasound examination of the left breast was
performed.

[Series 1: us breast*left* limited inc axilla · 0.08mm/px · 3 of 3 slices shown]
[im 1/3]
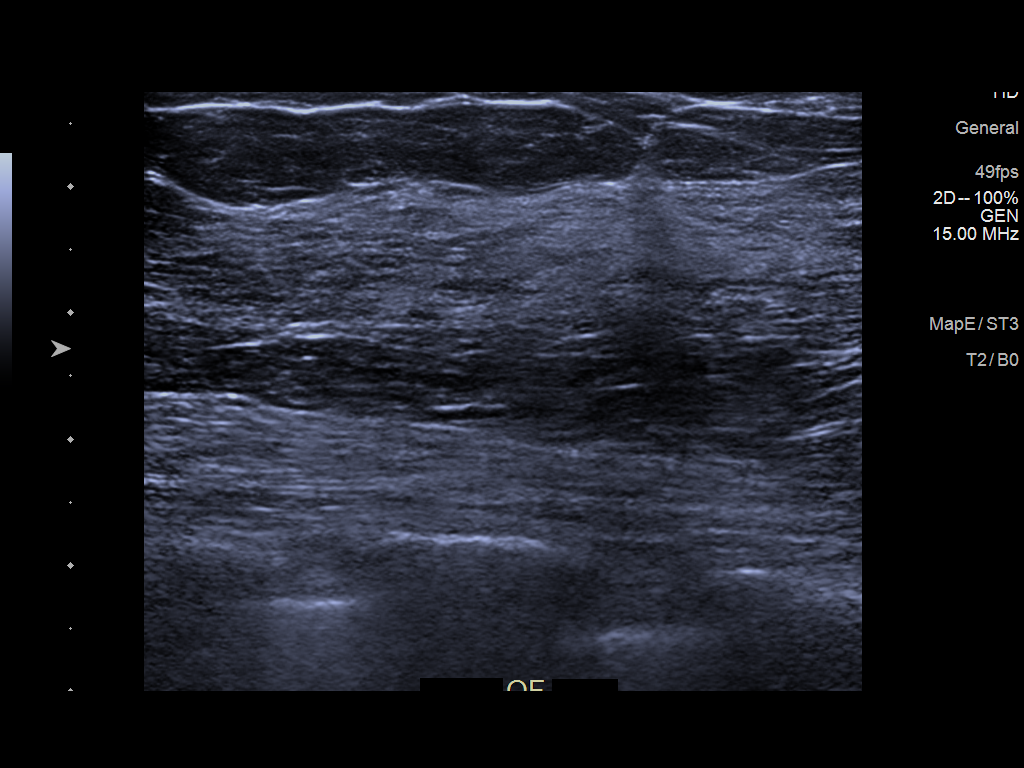
[im 2/3]
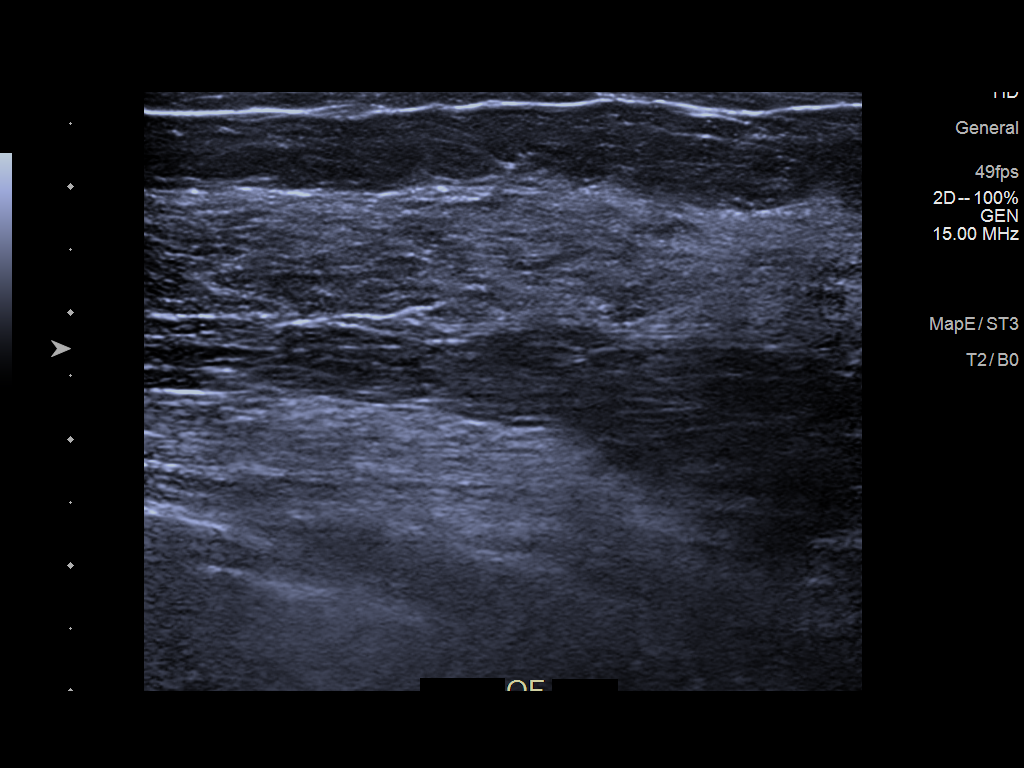
[im 3/3]
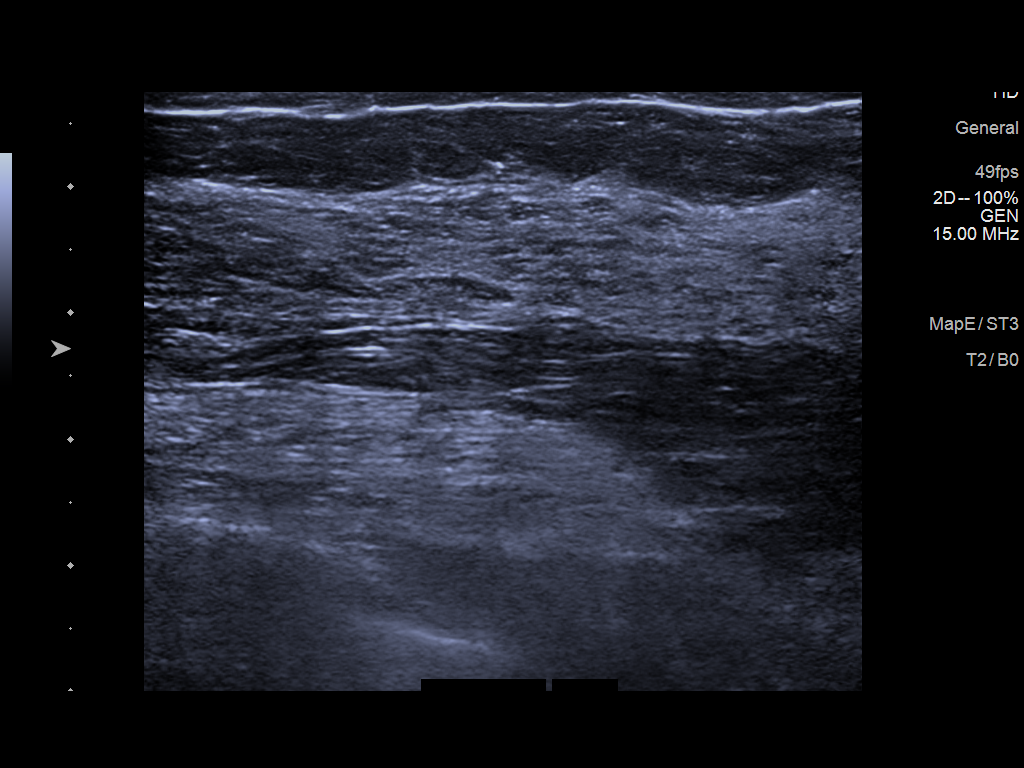

[3 of 3 positions shown; findings below may reference images not displayed]

ACR Breast Density Category c: The breast tissue is heterogeneously
dense, which may obscure small masses.
FINDINGS: Full field CC and MLO views of both breasts and spot tangential
views of the area of focal pain in both breasts were obtained.

RIGHT: No findings suspicious for malignancy. No mammographic
abnormality in the upper breast in the area of focal pain. Normal
fibroglandular tissue is present in this location. A benign 1 cm
intramammary lymph node is present in the outer breast which has
been stable over multiple prior mammograms.

Targeted ultrasound is performed in the area of focal pain,
demonstrating normal dense fibroglandular tissue at the 11:30
o'clock position approximately 5 cm from the nipple. No cyst, solid
mass or abnormal acoustic shadowing is identified.

LEFT: No findings suspicious for malignancy. No mammographic
abnormality in the upper breast in the area of focal pain. Normal
fibroglandular tissue is present in this location.

Targeted ultrasound is performed in the area of focal pain,
demonstrating normal dense fibroglandular tissue at the 12 o'clock
position approximately 5 cm from the nipple. No cyst, solid mass or
abnormal acoustic shadowing is identified.
IMPRESSION: No mammographic or sonographic evidence of malignancy involving
either breast.

RECOMMENDATION:
Screening mammogram in one year.(Code:CG-D-IKM)

I have discussed the findings and recommendations with the patient.
If applicable, a reminder letter will be sent to the patient
regarding the next appointment.

BI-RADS CATEGORY  1: Negative.

## 2021-06-23 ENCOUNTER — Telehealth: Payer: Self-pay

## 2021-06-23 ENCOUNTER — Telehealth: Payer: BC Managed Care – PPO

## 2021-06-23 NOTE — Telephone Encounter (Signed)
?  Care Management  ? ?Follow Up Note ? ? ?06/23/2021 ?Name: Lisa Galvan MRN: [id number] DOB: 11/28/1960 ? ? ?Referred by: [name], DO ?Reason for referral : Care Coordination (RNCM: Follow up for Chronic Disease Management and Care Coordination Needs ) ? ? ?An unsuccessful telephone outreach was attempted today. The patient was referred to the case management team for assistance with care management and care coordination. Attempted x 2 at different times.  ? ?Follow Up Plan: A HIPPA compliant phone message was left for the patient providing contact information and requesting a return call.  ? ?[name] RN, MSN, CCM ?Community Care Coordinator ?[facility] ?[place] [facility] ?Mobile: [phone]  ?

## 2021-06-24 NOTE — Progress Notes (Addendum)
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   Follow-up plan:   Return for [facility]) procedure: (ML) L4-5 LESI vs Caudal ESI.     Interventional Therapies  Risk   Complexity Considerations:   Estimated body mass index is 26.57 kg/m as calculated from the following:   Height as of 01/31/21: _0  (1.6 m).   Weight as of 01/31/21: 150 lb (68 kg). WNL   Planned   Pending:      Under consideration:   Therapeutic Q[place]za treatment of her diabetic peripheral neuropathy.   Completed:   Palliative right lumbar facet RFA x1 (last done 06/02/2019) (100/100/95) Palliative left lumbar facet RFA x1 (last done 06/30/2019) (100/100/95) Palliative bilateral lumbar facet block x2 (100/100/40/40) Palliative right sacroiliac joint block x1  (100/100/60/40) Diagnostic caudal ESI x1 (50/50/75)   Completed by other providers:   Diagnostic/therapeutic right L4-5 LESI x1 (done by Dr. Primus Bravo on 12/29/2014)   Therapeutic   Palliative (PRN) options:   Palliative right lumbar facet RFA #2 (last done 06/02/2019)  (100/100/95) Palliative left lumbar facet RFA #2 (last done 06/30/2019) (100/100/95) Palliative bilateral lumbar facet block #3 (100/100/40/40) Palliative right sacroiliac joint block #2  (100/100/60/40) Diagnostic caudal ESI #2 (  50/50/75)    Recent Visits No visits were found meeting these conditions. Showing recent visits within past 90 days and meeting all other requirements Today's Visits Date Type Provider Dept  06/26/21 Office Visit Milinda Pointer, MD [facility]  Showing today's visits and meeting all other requirements Future Appointments No visits were found meeting these conditions. Showing future appointments within next 90 days and meeting all other requirements  I discussed the assessment and treatment plan with the patient. The patient was provided an opportunity to ask questions and all were answered. The patient agreed with the plan and demonstrated an understanding of the instructions.  Patient advised to call back or seek an in-person evaluation if the symptoms or condition worsens.  Duration of encounter: 48 minutes.  Note by: Gaspar Cola, MD Date: 06/26/2021; Time: 10:02 AM

## 2021-06-26 ENCOUNTER — Ambulatory Visit (HOSPITAL_BASED_OUTPATIENT_CLINIC_OR_DEPARTMENT_OTHER): Payer: BC Managed Care – PPO | Admitting: Pain Medicine

## 2021-06-26 ENCOUNTER — Encounter: Payer: Self-pay | Admitting: Pain Medicine

## 2021-06-26 ENCOUNTER — Ambulatory Visit
Admission: RE | Admit: 2021-06-26 | Discharge: 2021-06-26 | Disposition: A | Discharge status: Home / Self Care | Payer: BC Managed Care – PPO | Source: Ambulatory Visit | Attending: Pain Medicine | Admitting: Pain Medicine

## 2021-06-26 ENCOUNTER — Other Ambulatory Visit: Payer: Self-pay

## 2021-06-26 VITALS — BP 134/88 | HR 86 | Temp 96.6°F | Resp 14 | Ht 62.0 in | Wt 166.0 lb

## 2021-06-26 DIAGNOSIS — M5417 Radiculopathy, lumbosacral region: Secondary | ICD-10-CM

## 2021-06-26 DIAGNOSIS — M545 Low back pain, unspecified: Secondary | ICD-10-CM | POA: Insufficient documentation

## 2021-06-26 DIAGNOSIS — E1142 Type 2 diabetes mellitus with diabetic polyneuropathy: Secondary | ICD-10-CM | POA: Insufficient documentation

## 2021-06-26 DIAGNOSIS — S134XXS Sprain of ligaments of cervical spine, sequela: Secondary | ICD-10-CM

## 2021-06-26 DIAGNOSIS — E669 Obesity, unspecified: Secondary | ICD-10-CM | POA: Insufficient documentation

## 2021-06-26 DIAGNOSIS — W19XXXS Unspecified fall, sequela: Secondary | ICD-10-CM | POA: Insufficient documentation

## 2021-06-26 DIAGNOSIS — G894 Chronic pain syndrome: Secondary | ICD-10-CM | POA: Insufficient documentation

## 2021-06-26 DIAGNOSIS — W19XXXA Unspecified fall, initial encounter: Secondary | ICD-10-CM | POA: Insufficient documentation

## 2021-06-26 DIAGNOSIS — M5412 Radiculopathy, cervical region: Secondary | ICD-10-CM | POA: Insufficient documentation

## 2021-06-26 DIAGNOSIS — S32030S Wedge compression fracture of third lumbar vertebra, sequela: Secondary | ICD-10-CM | POA: Insufficient documentation

## 2021-06-26 DIAGNOSIS — N644 Mastodynia: Secondary | ICD-10-CM | POA: Insufficient documentation

## 2021-06-26 DIAGNOSIS — M47819 Spondylosis without myelopathy or radiculopathy, site unspecified: Secondary | ICD-10-CM | POA: Insufficient documentation

## 2021-06-26 DIAGNOSIS — M533 Sacrococcygeal disorders, not elsewhere classified: Secondary | ICD-10-CM

## 2021-06-26 DIAGNOSIS — R296 Repeated falls: Secondary | ICD-10-CM | POA: Insufficient documentation

## 2021-06-26 DIAGNOSIS — R937 Abnormal findings on diagnostic imaging of other parts of musculoskeletal system: Secondary | ICD-10-CM | POA: Diagnosis present

## 2021-06-26 DIAGNOSIS — M4316 Spondylolisthesis, lumbar region: Secondary | ICD-10-CM | POA: Insufficient documentation

## 2021-06-26 DIAGNOSIS — G8929 Other chronic pain: Secondary | ICD-10-CM

## 2021-06-26 DIAGNOSIS — K219 Gastro-esophageal reflux disease without esophagitis: Secondary | ICD-10-CM | POA: Insufficient documentation

## 2021-06-26 DIAGNOSIS — I10 Essential (primary) hypertension: Secondary | ICD-10-CM | POA: Insufficient documentation

## 2021-06-26 DIAGNOSIS — M5136 Other intervertebral disc degeneration, lumbar region: Secondary | ICD-10-CM | POA: Insufficient documentation

## 2021-06-26 DIAGNOSIS — M4722 Other spondylosis with radiculopathy, cervical region: Secondary | ICD-10-CM | POA: Insufficient documentation

## 2021-06-26 DIAGNOSIS — M542 Cervicalgia: Secondary | ICD-10-CM | POA: Insufficient documentation

## 2021-06-26 DIAGNOSIS — R200 Anesthesia of skin: Secondary | ICD-10-CM

## 2021-06-26 DIAGNOSIS — Z041 Encounter for examination and observation following transport accident: Secondary | ICD-10-CM | POA: Diagnosis not present

## 2021-06-26 DIAGNOSIS — M47812 Spondylosis without myelopathy or radiculopathy, cervical region: Secondary | ICD-10-CM | POA: Diagnosis not present

## 2021-06-26 DIAGNOSIS — M4856XS Collapsed vertebra, not elsewhere classified, lumbar region, sequela of fracture: Secondary | ICD-10-CM | POA: Diagnosis not present

## 2021-06-26 DIAGNOSIS — M4802 Spinal stenosis, cervical region: Secondary | ICD-10-CM | POA: Diagnosis not present

## 2021-06-26 DIAGNOSIS — R202 Paresthesia of skin: Secondary | ICD-10-CM | POA: Insufficient documentation

## 2021-06-26 DIAGNOSIS — E782 Mixed hyperlipidemia: Secondary | ICD-10-CM | POA: Insufficient documentation

## 2021-06-26 MED ORDER — ORPHENADRINE CITRATE 30 MG/ML IJ SOLN
INTRAMUSCULAR | Status: AC
  Filled 2021-06-26: qty 2

## 2021-06-26 MED ORDER — KETOROLAC TROMETHAMINE 60 MG/2ML IM SOLN
INTRAMUSCULAR | Status: AC
  Filled 2021-06-26: qty 2

## 2021-06-26 MED ORDER — KETOROLAC TROMETHAMINE 60 MG/2ML IM SOLN
60.0000 mg | Freq: Once | INTRAMUSCULAR | Status: AC
  Administered 2021-06-26: 60 mg via INTRAMUSCULAR

## 2021-06-26 MED ORDER — ORPHENADRINE CITRATE 30 MG/ML IJ SOLN
60.0000 mg | Freq: Once | INTRAMUSCULAR | Status: AC
  Administered 2021-06-26: 60 mg via INTRAMUSCULAR

## 2021-06-26 NOTE — Addendum Note (Signed)
Addended by: Milinda Pointer A on: 06/26/2021 10:03 AM ? ? Modules accepted: Orders ? ?

## 2021-06-26 NOTE — Patient Instructions (Signed)
______________________________________________________________________ ° °Preparing for Procedure with Sedation ° °NOTICE: Due to recent regulatory changes, starting on November 21, 2020, procedures requiring intravenous (IV) sedation will no longer be performed at the [facility].  These types of procedures are required to be performed at [facility] ambulatory surgery facility.  We are very sorry for the inconvenience. ° °Procedure appointments are limited to planned procedures: °No Prescription Refills. °No disability issues will be discussed. °No medication changes will be discussed. ° °Instructions: °Oral Intake: Do not eat or drink anything for at least 8 hours prior to your procedure. (Exception: Blood Pressure Medication. See below.) °Transportation: A driver is required. You may not drive yourself after the procedure. °Blood Pressure Medicine: Do not forget to take your blood pressure medicine with a sip of water the morning of the procedure. If your Diastolic (lower reading) is above 100 mmHg, elective cases will be cancelled/rescheduled. °Blood thinners: These will need to be stopped for procedures. Notify our staff if you are taking any blood thinners. Depending on which one you take, there will be specific instructions on how and when to stop it. °Diabetics on insulin: Notify the staff so that you can be scheduled 1st case in the morning. If your diabetes requires high dose insulin, take only ½ of your normal insulin dose the morning of the procedure and notify the staff that you have done so. °Preventing infections: Shower with an antibacterial soap the morning of your procedure. °Build-up your immune system: Take 1000 mg of Vitamin C with every meal (3 times a day) the day prior to your procedure. °Antibiotics: Inform the staff if you have a condition or reason that requires you to take antibiotics before dental procedures. °Pregnancy: If you are pregnant, call and cancel the procedure. °Sickness: If  you have a cold, fever, or any active infections, call and cancel the procedure. °Arrival: You must be in the facility at least 30 minutes prior to your scheduled procedure. °Children: Do not bring children with you. °Dress appropriately: Bring dark clothing that you would not mind if they get stained. °Valuables: Do not bring any jewelry or valuables. ° °Reasons to call and reschedule or cancel your procedure: (Following these recommendations will minimize the risk of a serious complication.) °Surgeries: Avoid having procedures within 2 weeks of any surgery. (Avoid for 2 weeks before or after any surgery). °Flu Shots: Avoid having procedures within 2 weeks of a flu shots. (Avoid for 2 weeks before or after immunizations). °Barium: Avoid having a procedure within 7-10 days after having had a radiological study involving the use of radiological contrast. (Myelograms, Barium swallow or enema study). °Heart attacks: Avoid any elective procedures or surgeries for the initial 6 months after a "Myocardial Infarction" (Heart Attack). °Blood thinners: It is imperative that you stop these medications before procedures. Let us know if you if you take any blood thinner.  °Infection: Avoid procedures during or within two weeks of an infection (including chest colds or gastrointestinal problems). Symptoms associated with infections include: Localized redness, fever, chills, night sweats or profuse sweating, burning sensation when voiding, cough, congestion, stuffiness, runny nose, sore throat, diarrhea, nausea, vomiting, cold or Flu symptoms, recent or current infections. It is specially important if the infection is over the area that we intend to treat. °Heart and lung problems: Symptoms that may suggest an active cardiopulmonary problem include: cough, chest pain, breathing difficulties or shortness of breath, dizziness, ankle swelling, uncontrolled high or unusually low blood pressure, and/or palpitations. If you are    experiencing any of these symptoms, cancel your procedure and contact your primary care physician for an evaluation. ° °Remember:  °Regular Business hours are:  °Monday to Thursday 8:00 AM to 4:00 PM ° °Provider's Schedule: °Remon Quinto, MD:  °Procedure days: Tuesday and Thursday 7:30 AM to 4:00 PM ° °Bilal Lateef, MD:  °Procedure days: Monday and Wednesday 7:30 AM to 4:00 PM °______________________________________________________________________ ° ____________________________________________________________________________________________ ° °General Risks and Possible Complications ° °Patient Responsibilities: It is important that you read this as it is part of your informed consent. It is our duty to inform you of the risks and possible complications associated with treatments offered to you. It is your responsibility as a patient to read this and to ask questions about anything that is not clear or that you believe was not covered in this document. ° °Patient’s Rights: You have the right to refuse treatment. You also have the right to change your mind, even after initially having agreed to have the treatment done. However, under this last option, if you wait until the last second to change your mind, you may be charged for the materials used up to that point. ° °Introduction: Medicine is not an exact science. Everything in Medicine, including the lack of treatment(s), carries the potential for danger, harm, or loss (which is by definition: Risk). In Medicine, a complication is a secondary problem, condition, or disease that can aggravate an already existing one. All treatments carry the risk of possible complications. The fact that a side effects or complications occurs, does not imply that the treatment was conducted incorrectly. It must be clearly understood that these can happen even when everything is done following the highest safety standards. ° °No treatment: You can choose not to proceed with the  proposed treatment alternative. The “PRO(s)” would include: avoiding the risk of complications associated with the therapy. The “CON(s)” would include: not getting any of the treatment benefits. These benefits fall under one of three categories: diagnostic; therapeutic; and/or palliative. Diagnostic benefits include: getting information which can ultimately lead to improvement of the disease or symptom(s). Therapeutic benefits are those associated with the successful treatment of the disease. Finally, palliative benefits are those related to the decrease of the primary symptoms, without necessarily curing the condition (example: decreasing the pain from a flare-up of a chronic condition, such as incurable terminal cancer). ° °General Risks and Complications: These are associated to most interventional treatments. They can occur alone, or in combination. They fall under one of the following six (6) categories: no benefit or worsening of symptoms; bleeding; infection; nerve damage; allergic reactions; and/or death. °No benefits or worsening of symptoms: In Medicine there are no guarantees, only probabilities. No healthcare provider can ever guarantee that a medical treatment will work, they can only state the probability that it may. Furthermore, there is always the possibility that the condition may worsen, either directly, or indirectly, as a consequence of the treatment. °Bleeding: This is more common if the patient is taking a blood thinner, either prescription or over the counter (example: Goody Powders, Fish oil, Aspirin, Garlic, etc.), or if suffering a condition associated with impaired coagulation (example: Hemophilia, cirrhosis of the liver, low platelet counts, etc.). However, even if you do not have one on these, it can still happen. If you have any of these conditions, or take one of these drugs, make sure to notify your treating physician. °Infection: This is more common in patients with a compromised  immune system, either due to disease (example:   diabetes, cancer, human immunodeficiency virus [HIV], etc.), or due to medications or treatments (example: therapies used to treat cancer and rheumatological diseases). However, even if you do not have one on these, it can still happen. If you have any of these conditions, or take one of these drugs, make sure to notify your treating physician. °Nerve Damage: This is more common when the treatment is an invasive one, but it can also happen with the use of medications, such as those used in the treatment of cancer. The damage can occur to small secondary nerves, or to large primary ones, such as those in the spinal cord and brain. This damage may be temporary or permanent and it may lead to impairments that can range from temporary numbness to permanent paralysis and/or brain death. °Allergic Reactions: Any time a substance or material comes in contact with our body, there is the possibility of an allergic reaction. These can range from a mild skin rash (contact dermatitis) to a severe systemic reaction (anaphylactic reaction), which can result in death. °Death: In general, any medical intervention can result in death, most of the time due to an unforeseen complication. °____________________________________________________________________________________________  °

## 2021-06-27 ENCOUNTER — Other Ambulatory Visit: Payer: Self-pay | Admitting: Family Medicine

## 2021-06-28 NOTE — Telephone Encounter (Signed)
Requested medication (s) are due for refill today: yes ? ?Requested medication (s) are on the active medication list: no ? ?Last refill:  05/16/21 ? ?Future visit scheduled: yes ? ?Notes to clinic:  Unable to refill per protocol, cannot delegate. ? ? ? ?  ?Requested Prescriptions  ?Pending Prescriptions Disp Refills  ? tiZANidine (ZANAFLEX) 2 MG tablet [Pharmacy Med Name: TIZANIDINE 2MG  TABLETS] 30 tablet 0  ?  Sig: TAKE 1 TABLET(2 MG) BY MOUTH EVERY 6 HOURS AS NEEDED FOR MUSCLE SPASMS  ?  ? Not Delegated - Cardiovascular:  Alpha-2 Agonists - tizanidine Failed - 06/27/2021  3:52 PM  ?  ?  Failed - This refill cannot be delegated  ?  ?  Passed - Valid encounter within last 6 months  ?  Recent Outpatient Visits   ? ?      ? 3 weeks ago Type 2 diabetes mellitus with diabetic neuropathy, without long-term current use of insulin (HCC)  ? [facility] [place], Megan P, DO  ? 1 month ago Neck pain  ? [facility] [place], [place], DO  ? 2 months ago Dizziness  ? [facility] [place], [place] T, NP  ? 3 months ago Routine general medical examination at a health care facility  ? [facility] [place], [place] P, DO  ? 6 months ago Diabetic peripheral neuropathy (HCC)  ? [facility] [place], [place] P, DO  ? ?  ?  ?Future Appointments   ? ?        ? In 2 months [place], [place], MD [facility]  ? In 5 months Loel Dubonnet, Laural Benes, DO [facility]  ? ?  ? ?  ?  ?  ? ?

## 2021-07-03 ENCOUNTER — Telehealth: Payer: BC Managed Care – PPO

## 2021-07-04 ENCOUNTER — Ambulatory Visit
Admission: RE | Admit: 2021-07-04 | Discharge: 2021-07-04 | Disposition: A | Discharge status: Home / Self Care | Payer: BC Managed Care – PPO | Source: Ambulatory Visit | Attending: Pain Medicine | Admitting: Pain Medicine

## 2021-07-04 ENCOUNTER — Ambulatory Visit (HOSPITAL_BASED_OUTPATIENT_CLINIC_OR_DEPARTMENT_OTHER): Payer: BC Managed Care – PPO | Admitting: Pain Medicine

## 2021-07-04 ENCOUNTER — Other Ambulatory Visit: Payer: Self-pay

## 2021-07-04 ENCOUNTER — Encounter: Payer: Self-pay | Admitting: Pain Medicine

## 2021-07-04 VITALS — BP 119/86 | HR 84 | Temp 97.0°F | Resp 22 | Ht 62.0 in | Wt 166.0 lb

## 2021-07-04 DIAGNOSIS — R937 Abnormal findings on diagnostic imaging of other parts of musculoskeletal system: Secondary | ICD-10-CM | POA: Diagnosis not present

## 2021-07-04 DIAGNOSIS — M533 Sacrococcygeal disorders, not elsewhere classified: Secondary | ICD-10-CM | POA: Insufficient documentation

## 2021-07-04 DIAGNOSIS — M79604 Pain in right leg: Secondary | ICD-10-CM | POA: Insufficient documentation

## 2021-07-04 DIAGNOSIS — M5417 Radiculopathy, lumbosacral region: Secondary | ICD-10-CM

## 2021-07-04 DIAGNOSIS — Z91013 Allergy to seafood: Secondary | ICD-10-CM | POA: Insufficient documentation

## 2021-07-04 DIAGNOSIS — M4310 Spondylolisthesis, site unspecified: Secondary | ICD-10-CM

## 2021-07-04 DIAGNOSIS — M5136 Other intervertebral disc degeneration, lumbar region: Secondary | ICD-10-CM | POA: Diagnosis not present

## 2021-07-04 DIAGNOSIS — G8929 Other chronic pain: Secondary | ICD-10-CM

## 2021-07-04 DIAGNOSIS — M545 Low back pain, unspecified: Secondary | ICD-10-CM | POA: Insufficient documentation

## 2021-07-04 DIAGNOSIS — R200 Anesthesia of skin: Secondary | ICD-10-CM

## 2021-07-04 DIAGNOSIS — Z888 Allergy status to other drugs, medicaments and biological substances status: Secondary | ICD-10-CM | POA: Diagnosis not present

## 2021-07-04 DIAGNOSIS — S32030S Wedge compression fracture of third lumbar vertebra, sequela: Secondary | ICD-10-CM

## 2021-07-04 DIAGNOSIS — R202 Paresthesia of skin: Secondary | ICD-10-CM | POA: Diagnosis not present

## 2021-07-04 DIAGNOSIS — R29898 Other symptoms and signs involving the musculoskeletal system: Secondary | ICD-10-CM | POA: Diagnosis not present

## 2021-07-04 DIAGNOSIS — W19XXXS Unspecified fall, sequela: Secondary | ICD-10-CM | POA: Insufficient documentation

## 2021-07-04 DIAGNOSIS — Z91041 Radiographic dye allergy status: Secondary | ICD-10-CM | POA: Diagnosis not present

## 2021-07-04 DIAGNOSIS — Z87892 Personal history of anaphylaxis: Secondary | ICD-10-CM | POA: Diagnosis not present

## 2021-07-04 DIAGNOSIS — M79605 Pain in left leg: Secondary | ICD-10-CM | POA: Insufficient documentation

## 2021-07-04 MED ORDER — TRIAMCINOLONE ACETONIDE 40 MG/ML IJ SUSP
INTRAMUSCULAR | Status: AC
  Filled 2021-07-04: qty 1

## 2021-07-04 MED ORDER — PENTAFLUOROPROP-TETRAFLUOROETH EX AERO
INHALATION_SPRAY | Freq: Once | CUTANEOUS | Status: DC
  Filled 2021-07-04: qty 116

## 2021-07-04 MED ORDER — SODIUM CHLORIDE (PF) 0.9 % IJ SOLN
INTRAMUSCULAR | Status: AC
  Filled 2021-07-04: qty 10

## 2021-07-04 MED ORDER — TRIAMCINOLONE ACETONIDE 40 MG/ML IJ SUSP
40.0000 mg | Freq: Once | INTRAMUSCULAR | Status: AC
  Administered 2021-07-04: 40 mg

## 2021-07-04 MED ORDER — SODIUM CHLORIDE 0.9% FLUSH
2.0000 mL | Freq: Once | INTRAVENOUS | Status: AC
  Administered 2021-07-04: 2 mL

## 2021-07-04 MED ORDER — ROPIVACAINE HCL 2 MG/ML IJ SOLN
2.0000 mL | Freq: Once | INTRAMUSCULAR | Status: AC
  Administered 2021-07-04: 2 mL via EPIDURAL

## 2021-07-04 MED ORDER — LIDOCAINE HCL 2 % IJ SOLN
20.0000 mL | Freq: Once | INTRAMUSCULAR | Status: AC
  Administered 2021-07-04: 400 mg

## 2021-07-04 MED ORDER — ROPIVACAINE HCL 2 MG/ML IJ SOLN
INTRAMUSCULAR | Status: AC
  Filled 2021-07-04: qty 20

## 2021-07-04 MED ORDER — LIDOCAINE HCL 2 % IJ SOLN
INTRAMUSCULAR | Status: AC
  Filled 2021-07-04: qty 20

## 2021-07-04 NOTE — Progress Notes (Signed)
PROVIDER NOTE: Interpretation of information contained herein should be left to medically-trained personnel. Specific patient instructions are provided elsewhere under "Patient Instructions" section of medical record. This document was created in part using STT-dictation technology, any transcriptional errors that may result from this process are unintentional.  ?Patient: Lisa Galvan ?Type: Established ?DOB: 1961/01/01 ?MRN: [id number] ?PCP: Lisa Roys, DO  Service: Procedure ?DOS: 07/04/2021 ?Setting: Ambulatory ?Location: Ambulatory outpatient facility ?Delivery: Face-to-face Provider: Gaspar Cola, MD ?Specialty: Interventional Pain Management ?Specialty designation: 09 ?Location: Outpatient facility ?Ref. Prov.: Lisa Pointer, MD   ? ?Primary Reason for Visit: Interventional Pain Management Treatment. ?CC: Back Pain (lower) ? ?  ?Procedure:          Anesthesia, Analgesia, Anxiolysis:  ?Type: Therapeutic Epidural Steroid Injection #1  ?Region: Caudal ?Level: Sacrococcygeal   ?Laterality: Midline aiming at the right  Anesthesia: Local (1-2% Lidocaine)  ?Anxiolysis: None  ?Sedation: None  ?Guidance: Fluoroscopy         ? ? ?Position: Prone  ? ?1. Coccygodynia   ?2. Lumbosacral radiculopathy at L5 (Bilateral)   ?3. Chronic lower extremity pain (2ry area of Pain) (Bilateral)   ?4. Grade 1 Anterolisthesis of L4/5   ?5. Numbness and tingling of both legs (L5 dermatome)   ?6. Lower extremity weakness (Bilateral)   ?7. DDD (degenerative disc disease), lumbar   ?8. Chronic low back pain (1ry area of Pain) (Bilateral) w/o sciatica   ?9. Closed compression fracture of L3 lumbar vertebra, sequela   ? History of allergy to radiographic contrast media   ? History of Allergy to iodine   ? History of allergy to shellfish   ? History of anaphylaxis   ? ?NAS-11 Pain score:  ? Pre-procedure: 7 /10  ? Post-procedure: 5 /10  ? ?  ?Pre-op H&P Assessment:  ?Lisa Galvan is a 61 y.o. (year old), female patient, seen today  for interventional treatment. She  has a past surgical history that includes Esophagogastroduodenoscopy (egd) with propofol (N/A, 03/12/2017); Umbilical hernia repair (N/A, 05/01/2017); Hernia repair; Esophagogastroduodenoscopy (egd) with propofol (N/A, 03/30/2019); Colonoscopy with propofol (N/A, 03/30/2019); ORIF ankle fracture (Left, 07/30/2019); XI robotic assisted ventral hernia (N/A, 10/06/2019); Incisional hernia repair (N/A, 11/29/2020); and Supracervical abdominal hysterectomy. Lisa Galvan has a current medication list which includes the following prescription(s): accu-chek softclix lancets, aspirin ec, atorvastatin, vitamin d-3, duloxetine, duloxetine, epinephrine, fexofenadine, gabapentin, ketoconazole, magnesium, metformin, montelukast, omeprazole, potassium chloride, sucralfate, valacyclovir, and vitamin b-12, and the following Facility-Administered Medications: pentafluoroprop-tetrafluoroeth. Her primarily concern today is the Back Pain (lower) ? ?Initial Vital Signs:  ?Pulse/HCG Rate: 84ECG Heart Rate: 94 ?Temp: (!) 97 ?F (36.1 ?C) ?Resp: 14 ?BP: 123/88 ?SpO2: 100 % ? ?BMI: Estimated body mass index is 30.36 kg/m? as calculated from the following: ?  Height as of this encounter: 5\' 2"  (1.575 m). ?  Weight as of this encounter: 166 lb (75.3 kg). ? ?Risk Assessment: ?Allergies: Reviewed. She is allergic to benazepril, ivp dye [iodinated contrast media], lidocaine, lyrica [pregabalin], shellfish allergy, shellfish-derived products, trulicity [dulaglutide], and ace inhibitors.  ?Allergy Precautions: None required ?Coagulopathies: Reviewed. None identified.  ?Blood-thinner therapy: None at this time ?Active Infection(s): Reviewed. None identified. Lisa Galvan is afebrile ? ?Site Confirmation: Lisa Galvan was asked to confirm the procedure and laterality before marking the site ?Procedure checklist: Completed ?Consent: Before the procedure and under the influence of no sedative(s), amnesic(s), or anxiolytics, the  patient was informed of the treatment options, risks and possible complications. To fulfill our ethical and legal obligations, as recommended  by the American Medical Association's Code of Ethics, I have informed the patient of my clinical impression; the nature and purpose of the treatment or procedure; the risks, benefits, and possible complications of the intervention; the alternatives, including doing nothing; the risk(s) and benefit(s) of the alternative treatment(s) or procedure(s); and the risk(s) and benefit(s) of doing nothing. ?The patient was provided information about the general risks and possible complications associated with the procedure. These may include, but are not limited to: failure to achieve desired goals, infection, bleeding, organ or nerve damage, allergic reactions, paralysis, and death. ?In addition, the patient was informed of those risks and complications associated to Spine-related procedures, such as failure to decrease pain; infection (i.e.: Meningitis, epidural or intraspinal abscess); bleeding (i.e.: epidural hematoma, subarachnoid hemorrhage, or any other type of intraspinal or peri-dural bleeding); organ or nerve damage (i.e.: Any type of peripheral nerve, nerve root, or spinal cord injury) with subsequent damage to sensory, motor, and/or autonomic systems, resulting in permanent pain, numbness, and/or weakness of one or several areas of the body; allergic reactions; (i.e.: anaphylactic reaction); and/or death. ?Furthermore, the patient was informed of those risks and complications associated with the medications. These include, but are not limited to: allergic reactions (i.e.: anaphylactic or anaphylactoid reaction(s)); adrenal axis suppression; blood sugar elevation that in diabetics may result in ketoacidosis or comma; water retention that in patients with history of congestive heart failure may result in shortness of breath, pulmonary edema, and decompensation with resultant  heart failure; weight gain; swelling or edema; medication-induced neural toxicity; particulate matter embolism and blood vessel occlusion with resultant organ, and/or nervous system infarction; and/or aseptic necrosis of one or more joints. ?Finally, the patient was informed that Medicine is not an exact science; therefore, there is also the possibility of unforeseen or unpredictable risks and/or possible complications that may result in a catastrophic outcome. The patient indicated having understood very clearly. We have given the patient no guarantees and we have made no promises. Enough time was given to the patient to ask questions, all of which were answered to the patient's satisfaction. Ms. Carp has indicated that she wanted to continue with the procedure. ?Attestation: I, the ordering provider, attest that I have discussed with the patient the benefits, risks, side-effects, alternatives, likelihood of achieving goals, and potential problems during recovery for the procedure that I have provided informed consent. ?Date  Time: 07/04/2021  9:46 AM ? ?Pre-Procedure Preparation:  ?Monitoring: As per clinic protocol. Respiration, ETCO2, SpO2, BP, heart rate and rhythm monitor placed and checked for adequate function ?Safety Precautions: Patient was assessed for positional comfort and pressure points before starting the procedure. ?Time-out: I initiated and conducted the "Time-out" before starting the procedure, as per protocol. The patient was asked to participate by confirming the accuracy of the "Time Out" information. Verification of the correct person, site, and procedure were performed and confirmed by me, the nursing staff, and the patient. "Time-out" conducted as per Joint Commission's Universal Protocol (UP.01.01.01). ?Time: 1016 ? ?Description of Procedure:          ?Target Area: Caudal Epidural Canal. ?Approach: Midline approach. ?Area Prepped: Entire Posterior Sacrococcygeal Region ?DuraPrep (Iodine  Povacrylex [0.7% available iodine] and Isopropyl Alcohol, 74% w/w) ?Safety Precautions: Aspiration looking for blood return was conducted prior to all injections. At no point did we inject any substances, as a

## 2021-07-05 ENCOUNTER — Ambulatory Visit: Payer: BC Managed Care – PPO | Admitting: Licensed Clinical Social Worker

## 2021-07-05 ENCOUNTER — Telehealth: Payer: Self-pay | Admitting: *Deleted

## 2021-07-05 DIAGNOSIS — G4733 Obstructive sleep apnea (adult) (pediatric): Secondary | ICD-10-CM | POA: Diagnosis not present

## 2021-07-05 NOTE — Telephone Encounter (Signed)
No problems post procedure. 
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
## 2021-07-06 NOTE — Patient Instructions (Signed)
Visit Information ? ?Thank you for taking time to visit with me today. Please don't hesitate to contact me if I can be of assistance to you before our next scheduled telephone appointment. ? ?Following are the goals we discussed today:  ?Patient Self Care Activities:  ?Attend all scheduled appointments with providers ?Call provider office for new concerns or questions ?Utilize strategies discussed to assist in managing stress and promote self-care ? ?Our next appointment is by telephone on 10/09/21 at 2:15 PM ? ?Please call the care guide team at [phone] if you need to cancel or reschedule your appointment.  ? ?If you are experiencing a Mental Health or Behavioral Health Crisis or need someone to talk to, please call the Suicide and Crisis Lifeline: 988 ?call 911  ? ?Patient verbalizes understanding of instructions and care plan provided today and agrees to view in MyChart. Active MyChart status confirmed with patient.   ? ?Jenel Lucks, MSW, LCSW ?[facility] ?[place]  [facility] ?[email or web address] ?Phone [phone] ?9:40 AM ?  ?

## 2021-07-07 ENCOUNTER — Ambulatory Visit
Admission: RE | Admit: 2021-07-07 | Discharge: 2021-07-07 | Disposition: A | Discharge status: Home / Self Care | Payer: BC Managed Care – PPO | Source: Ambulatory Visit | Attending: Pain Medicine | Admitting: Pain Medicine

## 2021-07-07 ENCOUNTER — Other Ambulatory Visit: Payer: Self-pay

## 2021-07-07 DIAGNOSIS — M4807 Spinal stenosis, lumbosacral region: Secondary | ICD-10-CM | POA: Diagnosis not present

## 2021-07-07 DIAGNOSIS — M5412 Radiculopathy, cervical region: Secondary | ICD-10-CM | POA: Insufficient documentation

## 2021-07-07 DIAGNOSIS — M545 Low back pain, unspecified: Secondary | ICD-10-CM | POA: Insufficient documentation

## 2021-07-07 DIAGNOSIS — M5117 Intervertebral disc disorders with radiculopathy, lumbosacral region: Secondary | ICD-10-CM | POA: Diagnosis not present

## 2021-07-07 DIAGNOSIS — M5417 Radiculopathy, lumbosacral region: Secondary | ICD-10-CM

## 2021-07-07 DIAGNOSIS — R937 Abnormal findings on diagnostic imaging of other parts of musculoskeletal system: Secondary | ICD-10-CM | POA: Insufficient documentation

## 2021-07-07 DIAGNOSIS — S32030S Wedge compression fracture of third lumbar vertebra, sequela: Secondary | ICD-10-CM | POA: Insufficient documentation

## 2021-07-07 DIAGNOSIS — G8929 Other chronic pain: Secondary | ICD-10-CM | POA: Diagnosis not present

## 2021-07-07 DIAGNOSIS — M533 Sacrococcygeal disorders, not elsewhere classified: Secondary | ICD-10-CM

## 2021-07-07 DIAGNOSIS — S134XXS Sprain of ligaments of cervical spine, sequela: Secondary | ICD-10-CM | POA: Insufficient documentation

## 2021-07-07 DIAGNOSIS — W19XXXS Unspecified fall, sequela: Secondary | ICD-10-CM | POA: Insufficient documentation

## 2021-07-07 DIAGNOSIS — M542 Cervicalgia: Secondary | ICD-10-CM | POA: Insufficient documentation

## 2021-07-17 ENCOUNTER — Ambulatory Visit (INDEPENDENT_AMBULATORY_CARE_PROVIDER_SITE_OTHER): Payer: BC Managed Care – PPO

## 2021-07-17 ENCOUNTER — Other Ambulatory Visit: Payer: Self-pay

## 2021-07-17 DIAGNOSIS — Z23 Encounter for immunization: Secondary | ICD-10-CM
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
## 2021-07-25 ENCOUNTER — Ambulatory Visit: Payer: BC Managed Care – PPO | Attending: Pain Medicine | Admitting: Pain Medicine

## 2021-07-25 ENCOUNTER — Other Ambulatory Visit: Payer: Self-pay

## 2021-07-25 ENCOUNTER — Encounter: Payer: Self-pay | Admitting: Pain Medicine

## 2021-07-25 VITALS — BP 135/86 | HR 93 | Temp 97.3°F | Resp 16 | Ht 62.0 in | Wt 169.0 lb

## 2021-07-25 DIAGNOSIS — Z888 Allergy status to other drugs, medicaments and biological substances status: Secondary | ICD-10-CM | POA: Insufficient documentation

## 2021-07-25 DIAGNOSIS — M545 Low back pain, unspecified: Secondary | ICD-10-CM | POA: Insufficient documentation

## 2021-07-25 DIAGNOSIS — M79605 Pain in left leg: Secondary | ICD-10-CM | POA: Diagnosis not present

## 2021-07-25 DIAGNOSIS — M5412 Radiculopathy, cervical region: Secondary | ICD-10-CM | POA: Insufficient documentation

## 2021-07-25 DIAGNOSIS — M5136 Other intervertebral disc degeneration, lumbar region: Secondary | ICD-10-CM | POA: Diagnosis not present

## 2021-07-25 DIAGNOSIS — M79604 Pain in right leg: Secondary | ICD-10-CM | POA: Diagnosis not present

## 2021-07-25 DIAGNOSIS — M4310 Spondylolisthesis, site unspecified: Secondary | ICD-10-CM | POA: Diagnosis not present

## 2021-07-25 DIAGNOSIS — R202 Paresthesia of skin: Secondary | ICD-10-CM | POA: Insufficient documentation

## 2021-07-25 DIAGNOSIS — M5417 Radiculopathy, lumbosacral region: Secondary | ICD-10-CM | POA: Diagnosis not present

## 2021-07-25 DIAGNOSIS — R200 Anesthesia of skin: Secondary | ICD-10-CM

## 2021-07-25 DIAGNOSIS — Z91041 Radiographic dye allergy status: Secondary | ICD-10-CM | POA: Insufficient documentation

## 2021-07-25 DIAGNOSIS — M533 Sacrococcygeal disorders, not elsewhere classified: Secondary | ICD-10-CM | POA: Diagnosis not present

## 2021-07-25 DIAGNOSIS — M542 Cervicalgia: Secondary | ICD-10-CM

## 2021-07-25 DIAGNOSIS — G8929 Other chronic pain: Secondary | ICD-10-CM | POA: Diagnosis not present

## 2021-07-25 DIAGNOSIS — R937 Abnormal findings on diagnostic imaging of other parts of musculoskeletal system: Secondary | ICD-10-CM | POA: Insufficient documentation

## 2021-07-25 DIAGNOSIS — S32030S Wedge compression fracture of third lumbar vertebra, sequela: Secondary | ICD-10-CM

## 2021-07-25 DIAGNOSIS — G894 Chronic pain syndrome: Secondary | ICD-10-CM | POA: Diagnosis present

## 2021-07-25 DIAGNOSIS — R29898 Other symptoms and signs involving the musculoskeletal system: Secondary | ICD-10-CM | POA: Insufficient documentation

## 2021-07-25 NOTE — Progress Notes (Signed)
Safety precautions to be maintained throughout the outpatient stay will include: orient to surroundings, keep bed in low position, maintain call bell within reach at all times, provide assistance with transfer out of bed and ambulation.  

## 2021-07-25 NOTE — Patient Instructions (Signed)
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
## 2021-08-03 ENCOUNTER — Ambulatory Visit (HOSPITAL_BASED_OUTPATIENT_CLINIC_OR_DEPARTMENT_OTHER): Payer: BC Managed Care – PPO | Admitting: Pain Medicine

## 2021-08-03 ENCOUNTER — Ambulatory Visit
Admission: RE | Admit: 2021-08-03 | Discharge: 2021-08-03 | Disposition: A | Discharge status: Home / Self Care | Payer: BC Managed Care – PPO | Source: Ambulatory Visit | Attending: Pain Medicine | Admitting: Pain Medicine

## 2021-08-03 ENCOUNTER — Encounter: Payer: Self-pay | Admitting: Pain Medicine

## 2021-08-03 VITALS — BP 130/91 | HR 92 | Temp 97.1°F | Resp 29 | Ht 62.0 in | Wt 172.0 lb

## 2021-08-03 DIAGNOSIS — M5412 Radiculopathy, cervical region: Secondary | ICD-10-CM

## 2021-08-03 DIAGNOSIS — M542 Cervicalgia: Secondary | ICD-10-CM

## 2021-08-03 DIAGNOSIS — Z91041 Radiographic dye allergy status: Secondary | ICD-10-CM

## 2021-08-03 DIAGNOSIS — M4722 Other spondylosis with radiculopathy, cervical region: Secondary | ICD-10-CM | POA: Insufficient documentation

## 2021-08-03 DIAGNOSIS — M4802 Spinal stenosis, cervical region: Secondary | ICD-10-CM | POA: Insufficient documentation

## 2021-08-03 DIAGNOSIS — Z87892 Personal history of anaphylaxis: Secondary | ICD-10-CM

## 2021-08-03 DIAGNOSIS — Z888 Allergy status to other drugs, medicaments and biological substances status: Secondary | ICD-10-CM

## 2021-08-03 DIAGNOSIS — M47812 Spondylosis without myelopathy or radiculopathy, cervical region: Secondary | ICD-10-CM | POA: Diagnosis not present

## 2021-08-03 DIAGNOSIS — M5030 Other cervical disc degeneration, unspecified cervical region: Secondary | ICD-10-CM | POA: Diagnosis not present

## 2021-08-03 DIAGNOSIS — S134XXS Sprain of ligaments of cervical spine, sequela: Secondary | ICD-10-CM | POA: Insufficient documentation

## 2021-08-03 DIAGNOSIS — R937 Abnormal findings on diagnostic imaging of other parts of musculoskeletal system: Secondary | ICD-10-CM

## 2021-08-03 DIAGNOSIS — Z91013 Allergy to seafood: Secondary | ICD-10-CM | POA: Insufficient documentation

## 2021-08-03 MED ORDER — SODIUM CHLORIDE (PF) 0.9 % IJ SOLN
INTRAMUSCULAR | Status: AC
  Filled 2021-08-03: qty 10

## 2021-08-03 MED ORDER — DEXAMETHASONE SODIUM PHOSPHATE 10 MG/ML IJ SOLN
INTRAMUSCULAR | Status: AC
  Filled 2021-08-03: qty 1

## 2021-08-03 MED ORDER — ROPIVACAINE HCL 2 MG/ML IJ SOLN
INTRAMUSCULAR | Status: AC
  Filled 2021-08-03: qty 20

## 2021-08-03 MED ORDER — SODIUM CHLORIDE 0.9% FLUSH
1.0000 mL | Freq: Once | INTRAVENOUS | Status: AC
  Administered 2021-08-03: 1 mL

## 2021-08-03 MED ORDER — LIDOCAINE HCL 2 % IJ SOLN
20.0000 mL | Freq: Once | INTRAMUSCULAR | Status: AC
  Administered 2021-08-03: 400 mg

## 2021-08-03 MED ORDER — PENTAFLUOROPROP-TETRAFLUOROETH EX AERO
INHALATION_SPRAY | Freq: Once | CUTANEOUS | Status: DC
  Filled 2021-08-03: qty 116

## 2021-08-03 MED ORDER — LIDOCAINE HCL 2 % IJ SOLN
INTRAMUSCULAR | Status: AC
  Filled 2021-08-03: qty 20

## 2021-08-03 MED ORDER — DEXAMETHASONE SODIUM PHOSPHATE 10 MG/ML IJ SOLN
10.0000 mg | Freq: Once | INTRAMUSCULAR | Status: AC
  Administered 2021-08-03: 10 mg

## 2021-08-03 MED ORDER — ROPIVACAINE HCL 2 MG/ML IJ SOLN
1.0000 mL | Freq: Once | INTRAMUSCULAR | Status: AC
  Administered 2021-08-03: 1 mL via EPIDURAL

## 2021-08-03 NOTE — Progress Notes (Signed)
Safety precautions to be maintained throughout the outpatient stay will include: orient to surroundings, keep bed in low position, maintain call bell within reach at all times, provide assistance with transfer out of bed and ambulation.  

## 2021-08-03 NOTE — Patient Instructions (Signed)

## 2021-08-04 ENCOUNTER — Telehealth: Payer: Self-pay | Admitting: *Deleted

## 2021-08-04 NOTE — Telephone Encounter (Signed)
Post procedure call;  voicemail left with patient.  °

## 2021-08-05 DIAGNOSIS — G4733 Obstructive sleep apnea (adult) (pediatric): Secondary | ICD-10-CM | POA: Diagnosis not present

## 2021-08-15 ENCOUNTER — Ambulatory Visit: Payer: BC Managed Care – PPO | Admitting: Family Medicine

## 2021-08-15 ENCOUNTER — Encounter: Payer: Self-pay | Admitting: Family Medicine

## 2021-08-15 VITALS — BP 118/80 | HR 90 | Temp 98.0°F | Wt 174.0 lb

## 2021-08-15 DIAGNOSIS — R413 Other amnesia: Secondary | ICD-10-CM | POA: Diagnosis not present

## 2021-08-15 DIAGNOSIS — G4733 Obstructive sleep apnea (adult) (pediatric): Secondary | ICD-10-CM | POA: Diagnosis not present

## 2021-08-15 MED ORDER — IBUPROFEN 800 MG PO TABS: 800.0000 mg | ORAL_TABLET | Freq: Three times a day (TID) | ORAL | 3 refills | Status: DC | PRN

## 2021-08-15 NOTE — Progress Notes (Signed)
? ?BP 118/80   Pulse 90   Temp 98 ?F (36.7 ?C)   Wt 174 lb (78.9 kg)   SpO2 100%   BMI 31.83 kg/m?   ? ?Subjective:  ? ? Patient ID: Lisa Galvan, female    DOB: 05/01/1960, 61 y.o.   MRN: [id number] ? ?HPI: ?Lisa Galvan is a 61 y.o. female ? ?Chief Complaint  ?Patient presents with  ? Obstructive Sleep Apnea  ?  Patient states she received her CPAP on 07/04/21. Patient was told to follow up with PCP within 60 days of receiving CPAP   ? ?SLEEP APNEA ?Sleep apnea status: stable ?Duration: chronic ?Satisfied with current treatment?:   so-so ?CPAP use:  yes ?Sleep quality with CPAP use: excellent ?Treament compliance:good compliance ?Last sleep study:  ?Treatments attempted:  ?Wakes feeling refreshed:  yes ?Daytime hypersomnolence:  no ?Fatigue:  no ?Insomnia:  no ?Good sleep hygiene:  no ?Difficulty falling asleep:  no ?Difficulty staying asleep:  no ?Snoring bothers bed partner:  no ?Observed apnea by bed partner: no ?Obesity:  yes ?Hypertension: yes  ?Pulmonary hypertension:  no ?Coronary artery disease:  no ? ? ?  08/15/2021  ? 11:48 AM  ?MMSE - Mini Mental State Exam  ?Orientation to time 5  ?Orientation to Place 5  ?Registration 3  ?Attention/ Calculation 5  ?Recall 3  ?Language- name 2 objects 2  ?Language- repeat 1  ?Language- follow 3 step command 3  ?Language- read & follow direction 1  ?Write a sentence 1  ?Copy design 1  ?Total score 30  ? ? ? ?Relevant past medical, surgical, family and social history reviewed and updated as indicated. Interim medical history since our last visit reviewed. ?Allergies and medications reviewed and updated. ? ?Review of Systems  ?Constitutional: Negative.   ?Respiratory: Negative.    ?Cardiovascular: Negative.   ?Gastrointestinal: Negative.   ?Musculoskeletal:  Positive for back pain and myalgias. Negative for arthralgias, gait problem, joint swelling, neck pain and neck stiffness.  ?Skin: Negative.   ?Neurological: Negative.   ?Psychiatric/Behavioral: Negative.    ? ?Per  HPI unless specifically indicated above ? ?   ?Objective:  ?  ?BP 118/80   Pulse 90   Temp 98 ?F (36.7 ?C)   Wt 174 lb (78.9 kg)   SpO2 100%   BMI 31.83 kg/m?   ?Wt Readings from Last 3 Encounters:  ?08/15/21 174 lb (78.9 kg)  ?08/03/21 172 lb (78 kg)  ?07/25/21 169 lb (76.7 kg)  ?  ?Physical Exam ?Vitals and nursing note reviewed.  ?Constitutional:   ?   General: She is not in acute distress. ?   Appearance: Normal appearance. She is not ill-appearing, toxic-appearing or diaphoretic.  ?HENT:  ?   Head: Normocephalic and atraumatic.  ?   Right Ear: External ear normal.  ?   Left Ear: External ear normal.  ?   Nose: Nose normal.  ?   Mouth/Throat:  ?   Mouth: Mucous membranes are moist.  ?   Pharynx: Oropharynx is clear.  ?Eyes:  ?   General: No scleral icterus.    ?   Right eye: No discharge.     ?   Left eye: No discharge.  ?   Extraocular Movements: Extraocular movements intact.  ?   Conjunctiva/sclera: Conjunctivae normal.  ?   Pupils: Pupils are equal, round, and reactive to light.  ?Cardiovascular:  ?   Rate and Rhythm: Normal rate and regular rhythm.  ?   Pulses: Normal pulses.  ?  Heart sounds: Normal heart sounds. No murmur heard. ?  No friction rub. No gallop.  ?Pulmonary:  ?   Effort: Pulmonary effort is normal. No respiratory distress.  ?   Breath sounds: Normal breath sounds. No stridor. No wheezing, rhonchi or rales.  ?Chest:  ?   Chest wall: No tenderness.  ?Musculoskeletal:     ?   General: Normal range of motion.  ?   Cervical back: Normal range of motion and neck supple.  ?Skin: ?   General: Skin is warm and dry.  ?   Capillary Refill: Capillary refill takes less than 2 seconds.  ?   Coloration: Skin is not jaundiced or pale.  ?   Findings: No bruising, erythema, lesion or rash.  ?Neurological:  ?   General: No focal deficit present.  ?   Mental Status: She is alert and oriented to person, place, and time. Mental status is at baseline.  ?Psychiatric:     ?   Mood and Affect: Mood normal.     ?    Behavior: Behavior normal.     ?   Thought Content: Thought content normal.     ?   Judgment: Judgment normal.  ? ? ?Results for orders placed or performed in visit on 06/13/21  ?HM DIABETES EYE EXAM  ?Result Value Ref Range  ? HM Diabetic Eye Exam No Retinopathy No Retinopathy  ? ?   ?Assessment & Plan:  ? ?Problem List Items Addressed This Visit   ? ?  ? Respiratory  ? OSA (obstructive sleep apnea) - Primary  ?  Tolerating her CPAP OK, not loving it. Feeling like her sleep has been better. Continue to monitor. Call with any concerns. ? ?  ?  ? ?Other Visit Diagnoses   ? ? Memory change      ? MMSE normal. Discussed memory games, word search, sudoku. Call with any concerns.   ? ?  ?  ? ?Follow up plan: ?Return as scheduled. ? ? ? ? ? ?

## 2021-08-15 NOTE — Assessment & Plan Note (Signed)
Tolerating her CPAP OK, not loving it. Feeling like her sleep has been better. Continue to monitor. Call with any concerns. ?

## 2021-08-16 ENCOUNTER — Ambulatory Visit: Payer: Self-pay

## 2021-08-16 ENCOUNTER — Ambulatory Visit: Payer: BC Managed Care – PPO

## 2021-08-16 DIAGNOSIS — I10 Essential (primary) hypertension: Secondary | ICD-10-CM

## 2021-08-16 DIAGNOSIS — R269 Unspecified abnormalities of gait and mobility: Secondary | ICD-10-CM

## 2021-08-16 DIAGNOSIS — Z9181 History of falling: Secondary | ICD-10-CM

## 2021-08-16 DIAGNOSIS — M79671 Pain in right foot: Secondary | ICD-10-CM

## 2021-08-16 DIAGNOSIS — E1340 Other specified diabetes mellitus with diabetic neuropathy, unspecified: Secondary | ICD-10-CM

## 2021-08-16 DIAGNOSIS — G894 Chronic pain syndrome: Secondary | ICD-10-CM

## 2021-08-16 DIAGNOSIS — G8929 Other chronic pain: Secondary | ICD-10-CM

## 2021-08-16 DIAGNOSIS — E1142 Type 2 diabetes mellitus with diabetic polyneuropathy: Secondary | ICD-10-CM

## 2021-08-16 DIAGNOSIS — E785 Hyperlipidemia, unspecified: Secondary | ICD-10-CM

## 2021-08-16 DIAGNOSIS — E1140 Type 2 diabetes mellitus with diabetic neuropathy, unspecified: Secondary | ICD-10-CM

## 2021-08-16 NOTE — Patient Instructions (Signed)
Visit Information ? ?Thank you for taking time to visit with me today. Please don't hesitate to contact me if I can be of assistance to you before our next scheduled telephone appointment. ? ?Following are the goals we discussed today:  ?RNCM Clinical Goal(s):  ?Patient will verbalize understanding of plan for management of HTN, HLD, DMII, and Chronic pain, Falls   ?verbalize basic understanding of HTN, HLD, DMII, and Chronic pain, Falls disease process and self health management plan  ?take all medications exactly as prescribed and will call provider for medication related questions ?demonstrate understanding of rationale for each prescribed medication  ?attend all scheduled medical appointments: 12-04-2021 at 0900 am ?demonstrate improved and ongoing health management independence by effective management of chronic diseases  ?continue to work with Consulting civil engineer and/or Social Worker to address care management and care coordination needs related to HTN, HLD, DMII, and Chronic pain, falls  ?demonstrate a decrease in HTN, HLD, DMII, and Chronic pain, falls, exacerbations  ?demonstrate ongoing self health care management ability with effective management of chronic conditions through collaboration with RN Care manager, provider, and care team.  ?  ?Interventions: ?1:1 collaboration with primary care provider regarding development and update of comprehensive plan of care as evidenced by provider attestation and co-signature ?Inter-disciplinary care team collaboration (see longitudinal plan of care) ?Evaluation of current treatment plan related to  self management and patient's adherence to plan as established by provider ?  ?  ?SDOH Barriers (Status: Goal on track: YES.)  ?Patient interviewed and SDOH assessment performed ?       ?Patient interviewed and appropriate assessments performed ?Provided patient with information about resources available and care guides to help with needs that may arise to assist with meeting  SDOH goals ?Advised patient to call the office for any changes in SDOH ?Provided education to patient/caregiver regarding level of care options. ?  ?  ?  ?Diabetes:  (Status: Goal on track: YES.) ?     ?Lab Results  ?Component Value Date  ?  HGBA1C 6.1 (H) 06/06/2021  ?Previous on 03-06-2021: 5.8% ?Assessed patient's understanding of A1c goal: <7%. 08-16-2021: The patient knows the goal of A1C is less than 7.  ?Provided education to patient about basic DM disease process. 08-16-2021: Education given. The patient is doing well with the management of her DM; ?Reviewed medications with patient and discussed importance of medication adherence. 02-08-2021: The patient has had an increase in Cymbalta and that has helped some. Will see the pain specialist on 02-15-2021 for further recommendations for her peripheral neuropathy pain. She is doing well with DM except for neuropathy pain. Education on alternate pain relief methods. She has aromatherapy and ask about using aromatherapy. The patient states that she is willing to try anything if it will help her pain level. Education and support given.  05-12-2021: The patient is doing well with her DM management, still having neuropathy pain and discomfort. Had a fall in December (see falls plan of care for more details). Is doing "fair" today. States that she is following up with the pcp next week. Saw pcp on 04-26-2021 and had a CT scan of head. 08-16-2021: The patient is stable with her DM at this time. Is dealing with neuropathy pain but seeing specialist on a regular basis. She has had changes recently in her gabapentin. Education provided on discussing changes with the specialist and pcp about concerns or worsening neuropathy pain. She cannot tolerate the Trulicity and is no longer taking. This has  been added to her allergy list. Will continue to monitor for changes.  ?Reviewed prescribed diet with patient heart healthy/ADA. 08-16-2021: Review and patient is compliant with  dietary restrictions. The patient states that she helped her daughter and granddaughter plant a garden a week ago and the granddaughter is excited to have a garden.; ?Counseled on importance of regular laboratory monitoring as prescribed. 08-16-2021: Has regular lab work done;        ?Discussed plans with patient for ongoing care management follow up and provided patient with direct contact information for care management team;      ?Provided patient with written educational materials related to hypo and hyperglycemia and importance of correct treatment. 05-12-2021: The patient denies any lows or highs. 08-16-2021: The lowest she has seen is 79 and the highest she has seen is 130.       ?Reviewed scheduled/upcoming provider appointments including: 12-04-2021 at 0900 am;         ?Advised patient, providing education and rationale, to check cbg as directed and record. 08-16-2021: The patient is taking on a consistent basis and the range has been 79 to 130       ?call provider for findings outside established parameters;       ?Review of patient status, including review of consultants reports, relevant laboratory and other test results, and medications completed;       ?Screening for signs and symptoms of depression related to chronic disease state;        ?Assessed social determinant of health barriers;        ?  ?Hyperlipidemia:  (Status: Goal on track: YES.) ?     ?Lab Results  ?Component Value Date  ?  CHOL 203 (H) 06/06/2021  ?  HDL 125 06/06/2021  ?  [place] 65 06/06/2021  ?  TRIG 77 06/06/2021  ?  CHOLHDL 1.4 01/03/2017  ?  ?  ?Medication review performed; medication list updated in electronic medical record. 08-16-2021: Takes atorvastatin 20 mg QD. Compliance with medications noted.  ?Provider established cholesterol goals reviewed; ?Counseled on importance of regular laboratory monitoring as prescribed. 08-16-2021: The patient has regular lab work for evaluation of cholesterol levels; ?Provided HLD educational  materials; ?Reviewed role and benefits of statin for ASCVD risk reduction; ?Discussed strategies to manage statin-induced myalgias; ?Reviewed importance of limiting foods high in cholesterol. 08-16-2021: Review and education; ?Reviewed exercise goals and target of 150 minutes per week; ?  ?Hypertension: (Status: Goal on Track (progressing): YES.) ?Last practice recorded BP readings:  ?   ?BP Readings from Last 3 Encounters:  ?08/15/21 118/80  ?08/03/21 (!) 130/91  ?07/25/21 135/86  ?Most recent eGFR/CrCl:  ?     ?Lab Results  ?Component Value Date  ?  EGFR 76 06/06/2021  ?  No components found for: CRCL ?  ?Evaluation of current treatment plan related to hypertension self management and patient's adherence to plan as established by provider. 08-16-2021: The patient is having more normal blood pressure readings. States she is doing fair. Denies any new concerns with HTN or heart health   ?Provided education to patient re: stroke prevention, s/s of heart attack and stroke; ?Reviewed prescribed diet heart healthy/ADA diet  ?Reviewed medications with patient and discussed importance of compliance;  ?Counseled on the importance of exercise goals with target of 150 minutes per week ?Discussed plans with patient for ongoing care management follow up and provided patient with direct contact information for care management team; ?Advised patient, providing education and rationale, to  monitor blood pressure daily and record, calling PCP for findings outside established parameters;  ?Reviewed scheduled/upcoming provider appointments including: 12-04-2021 at 0900 am ?Provided education on prescribed diet heart healthy/ADA diet ;  ?Discussed complications of poorly controlled blood pressure such as heart disease, stroke, circulatory complications, vision complications, kidney impairment, sexual dysfunction;  ?  ?Pain:  (Status: Goal on track: YES.) ?Pain assessment performed. 05-12-2021: Denies any pain today. 08-16-2021: The patient  is having back pain today and rates it at a 5 on a scale of 0-10. States her neck pain has resolved since having an injection. ?Medications reviewed. 08-16-2021: Compliant with medication. States the Advil is mo
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
## 2021-08-17 DIAGNOSIS — G5603 Carpal tunnel syndrome, bilateral upper limbs: Secondary | ICD-10-CM | POA: Diagnosis not present
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
## 2021-08-22 ENCOUNTER — Encounter: Payer: Self-pay | Admitting: Pain Medicine

## 2021-08-22 ENCOUNTER — Ambulatory Visit: Payer: BC Managed Care – PPO | Attending: Pain Medicine | Admitting: Pain Medicine

## 2021-08-22 VITALS — BP 123/94 | HR 102 | Temp 97.3°F | Ht 62.0 in | Wt 174.0 lb

## 2021-08-22 DIAGNOSIS — R29898 Other symptoms and signs involving the musculoskeletal system: Secondary | ICD-10-CM | POA: Diagnosis not present

## 2021-08-22 DIAGNOSIS — M47812 Spondylosis without myelopathy or radiculopathy, cervical region: Secondary | ICD-10-CM | POA: Diagnosis not present

## 2021-08-22 DIAGNOSIS — M4310 Spondylolisthesis, site unspecified: Secondary | ICD-10-CM | POA: Insufficient documentation

## 2021-08-22 DIAGNOSIS — M4802 Spinal stenosis, cervical region: Secondary | ICD-10-CM | POA: Diagnosis not present

## 2021-08-22 DIAGNOSIS — M542 Cervicalgia: Secondary | ICD-10-CM | POA: Insufficient documentation

## 2021-08-22 DIAGNOSIS — S134XXS Sprain of ligaments of cervical spine, sequela: Secondary | ICD-10-CM | POA: Insufficient documentation

## 2021-08-22 DIAGNOSIS — M5417 Radiculopathy, lumbosacral region: Secondary | ICD-10-CM | POA: Insufficient documentation

## 2021-08-22 DIAGNOSIS — R937 Abnormal findings on diagnostic imaging of other parts of musculoskeletal system: Secondary | ICD-10-CM | POA: Diagnosis not present

## 2021-08-22 DIAGNOSIS — M4722 Other spondylosis with radiculopathy, cervical region: Secondary | ICD-10-CM | POA: Insufficient documentation

## 2021-08-22 DIAGNOSIS — G5603 Carpal tunnel syndrome, bilateral upper limbs: Secondary | ICD-10-CM | POA: Insufficient documentation

## 2021-08-22 DIAGNOSIS — M5030 Other cervical disc degeneration, unspecified cervical region: Secondary | ICD-10-CM | POA: Diagnosis not present

## 2021-08-22 DIAGNOSIS — G5601 Carpal tunnel syndrome, right upper limb: Secondary | ICD-10-CM | POA: Insufficient documentation

## 2021-08-22 DIAGNOSIS — M5412 Radiculopathy, cervical region: Secondary | ICD-10-CM | POA: Diagnosis not present

## 2021-08-22 NOTE — Patient Instructions (Signed)
____________________________________________________________________________________________ ? ?Muscle Spasms & Cramps ? ?Cause:  ?The most common cause of muscle spasms and cramps is vitamin and/or electrolyte (calcium, potassium, sodium, etc.) deficiencies. ? ?Possible triggers: ?Sweating - causes loss of electrolytes thru the skin. ?Steroids - causes loss of electrolytes thru the urine. ? ?Treatment: ?Gatorade (or any other electrolyte-replenishing drink) - Take 1, 8 oz glass with each meal (3 times a day). ?OTC (over-the-counter) Magnesium 400 to 500 mg - Take 1 tablet twice a day (one with breakfast and one before bedtime). If you have kidney problems, talk to your primary care physician before taking any Magnesium. ?Tonic Water with quinine - Take 1, 8 oz glass before bedtime.  ? ?____________________________________________________________________________________________ ? ______________________________________________________________________ ? ?Preparing for Procedure with Sedation ? ?NOTICE: Due to recent regulatory changes, starting on November 21, 2020, procedures requiring intravenous (IV) sedation will no longer be performed at the [facility].  These types of procedures are required to be performed at [facility] ambulatory surgery facility.  We are very sorry for the inconvenience. ? ?Procedure appointments are limited to planned procedures: ?No Prescription Refills. ?No disability issues will be discussed. ?No medication changes will be discussed. ? ?Instructions: ?Oral Intake: Do not eat or drink anything for at least 8 hours prior to your procedure. (Exception: Blood Pressure Medication. See below.) ?Transportation: A driver is required. You may not drive yourself after the procedure. ?Blood Pressure Medicine: Do not forget to take your blood pressure medicine with a sip of water the morning of the procedure. If your Diastolic (lower reading) is above 100 mmHg, elective cases will be  cancelled/rescheduled. ?Blood thinners: These will need to be stopped for procedures. Notify our staff if you are taking any blood thinners. Depending on which one you take, there will be specific instructions on how and when to stop it. ?Diabetics on insulin: Notify the staff so that you can be scheduled 1st case in the morning. If your diabetes requires high dose insulin, take only ? of your normal insulin dose the morning of the procedure and notify the staff that you have done so. ?Preventing infections: Shower with an antibacterial soap the morning of your procedure. ?Build-up your immune system: Take 1000 mg of Vitamin C with every meal (3 times a day) the day prior to your procedure. ?Antibiotics: Inform the staff if you have a condition or reason that requires you to take antibiotics before dental procedures. ?Pregnancy: If you are pregnant, call and cancel the procedure. ?Sickness: If you have a cold, fever, or any active infections, call and cancel the procedure. ?Arrival: You must be in the facility at least 30 minutes prior to your scheduled procedure. ?Children: Do not bring children with you. ?Dress appropriately: There is always the possibility that your clothing may get soiled. ?Valuables: Do not bring any jewelry or valuables. ? ?Reasons to call and reschedule or cancel your procedure: (Following these recommendations will minimize the risk of a serious complication.) ?Surgeries: Avoid having procedures within 2 weeks of any surgery. (Avoid for 2 weeks before or after any surgery). ?Flu Shots: Avoid having procedures within 2 weeks of a flu shots. (Avoid for 2 weeks before or after immunizations). ?Barium: Avoid having a procedure within 7-10 days after having had a radiological study involving the use of radiological contrast. (Myelograms, Barium swallow or enema study). ?Heart attacks: Avoid any elective procedures or surgeries for the initial 6 months after a "Myocardial Infarction" (Heart  Attack). ?Blood thinners: It is imperative that you stop these  medications before procedures. Let us know if you if you take any blood thinner.  ?Infection: Avoid procedures during or within two weeks of an infection (including chest colds or gastrointestinal problems). Symptoms associated with infections include: Localized redness, fever, chills, night sweats or profuse sweating, burning sensation when voiding, cough, congestion, stuffiness, runny nose, sore throat, diarrhea, nausea, vomiting, cold or Flu symptoms, recent or current infections. It is specially important if the infection is over the area that we intend to treat. ?Heart and lung problems: Symptoms that may suggest an active cardiopulmonary problem include: cough, chest pain, breathing difficulties or shortness of breath, dizziness, ankle swelling, uncontrolled high or unusually low blood pressure, and/or palpitations. If you are experiencing any of these symptoms, cancel your procedure and contact your primary care physician for an evaluation. ? ?Remember:  ?Regular Business hours are:  ?Monday to Thursday 8:00 AM to 4:00 PM ? ?Provider's Schedule: ?Delano Metz, MD:  ?Procedure days: Tuesday and Thursday 7:30 AM to 4:00 PM ? ?Edward Jolly, MD:  ?Procedure days: Monday and Wednesday 7:30 AM to 4:00 PM ?______________________________________________________________________ ? ____________________________________________________________________________________________ ? ?General Risks and Possible Complications ? ?Patient Responsibilities: It is important that you read this as it is part of your informed consent. It is our duty to inform you of the risks and possible complications associated with treatments offered to you. It is your responsibility as a patient to read this and to ask questions about anything that is not clear or that you believe was not covered in this document. ? ?Patient?s Rights: You have the right to refuse treatment. You also  have the right to change your mind, even after initially having agreed to have the treatment done. However, under this last option, if you wait until the last second to change your mind, you may be charged for the materials used up to that point. ? ?Introduction: Medicine is not an Visual merchandiser. Everything in Medicine, including the lack of treatment(s), carries the potential for danger, harm, or loss (which is by definition: Risk). In Medicine, a complication is a secondary problem, condition, or disease that can aggravate an already existing one. All treatments carry the risk of possible complications. The fact that a side effects or complications occurs, does not imply that the treatment was conducted incorrectly. It must be clearly understood that these can happen even when everything is done following the highest safety standards. ? ?No treatment: You can choose not to proceed with the proposed treatment alternative. The ?PRO(s)? would include: avoiding the risk of complications associated with the therapy. The ?CON(s)? would include: not getting any of the treatment benefits. These benefits fall under one of three categories: diagnostic; therapeutic; and/or palliative. Diagnostic benefits include: getting information which can ultimately lead to improvement of the disease or symptom(s). Therapeutic benefits are those associated with the successful treatment of the disease. Finally, palliative benefits are those related to the decrease of the primary symptoms, without necessarily curing the condition (example: decreasing the pain from a flare-up of a chronic condition, such as incurable terminal cancer). ? ?General Risks and Complications: These are associated to most interventional treatments. They can occur alone, or in combination. They fall under one of the following six (6) categories: no benefit or worsening of symptoms; bleeding; infection; nerve damage; allergic reactions; and/or death. ?No benefits or  worsening of symptoms: In Medicine there are no guarantees, only probabilities. No healthcare provider can ever guarantee that a medical treatment will work, they can only state the probability  that it may. Furthermore

## 2021-08-22 NOTE — Progress Notes (Signed)
Safety precautions to be maintained throughout the outpatient stay will include: orient to surroundings, keep bed in low position, maintain call bell within reach at all times, provide assistance with transfer out of bed and ambulation.  

## 2021-09-04 ENCOUNTER — Encounter: Payer: Self-pay | Admitting: Gastroenterology

## 2021-09-04 ENCOUNTER — Ambulatory Visit (INDEPENDENT_AMBULATORY_CARE_PROVIDER_SITE_OTHER): Payer: BC Managed Care – PPO | Admitting: Gastroenterology

## 2021-09-04 VITALS — BP 131/70 | HR 105 | Temp 97.7°F | Ht 62.0 in | Wt 178.0 lb

## 2021-09-04 DIAGNOSIS — K8681 Exocrine pancreatic insufficiency: Secondary | ICD-10-CM

## 2021-09-04 DIAGNOSIS — K5909 Other constipation: Secondary | ICD-10-CM | POA: Diagnosis not present

## 2021-09-04 DIAGNOSIS — G4733 Obstructive sleep apnea (adult) (pediatric): Secondary | ICD-10-CM | POA: Diagnosis not present

## 2021-09-04 MED ORDER — ZENPEP 40000-126000 UNITS PO CPEP: ORAL_CAPSULE | ORAL | 2 refills | Status: DC

## 2021-09-04 NOTE — Progress Notes (Signed)
?  ?Arlyss Repress, MD ?[address]  ?[address]  ?[place], [place] [address]  ?Main: [phone]  ?Fax: [phone] ? ? ? ?Gastroenterology Consultation ? ?Referring Provider:     Dorcas Carrow, DO ?Primary Care Physician:  Dorcas Carrow, DO ?Primary Gastroenterologist:  Dr. Arlyss Repress  ?Reason for Consultation:     Exocrine pancreatic insufficiency, chronic constipation ?      ? HPI:   ?Lisa Galvan is a 61 y.o. female referred for consultation & management  by Dr. Laural Benes, Megan P, DO.  Patient has history of exocrine pancreatic insufficiency secondary to atrophy of the pancreas, history of H. pylori infection s/p triple therapy, history of ventral hernia repair in 11/2020 made a follow-up appointment to discuss about few months history of constipation.  Patient reports that she has been suffering from irregular bowel movements, not having a BM for 4 to 5 days.  TSH is normal, no evidence of anemia.  Her hemoglobin A1c is 6.1, she has been gaining weight.  Patient stopped taking Zenpep because she states that she did not know that it is a long-term medication and did not check if there are refills. ? ?Denies significant stress in her life ?She does not smoke or drink alcohol ? ?GI Procedures: Colonoscopy ~2013 in [place] ?EGD ~2011, reportedly normal ? ?EGD 03/12/17: ?- Normal duodenal bulb and second portion of the duodenum. ?- Normal stomach. Biopsied. ?- Normal gastroesophageal junction and esophagus. ?DIAGNOSIS:  ?A. STOMACH; RANDOM COLD BIOPSY:  ?- HELICOBACTER PYLORI-ASSOCIATED GASTRITIS, WITH MILD CHRONIC ACTIVE  ?INFLAMMATION.  ?- NEGATIVE FOR INTESTINAL METAPLASIA, ATROPHY, DYSPLASIA, AND  ?MALIGNANCY.  ?- H. PYLORI BACTERIA ARE SEEN IN HEMATOXYLIN AND EOSIN SECTIONS. ? ?Upper endoscopy 03/30/2019 ?- Normal duodenal bulb and second portion of the duodenum. ?- Erythematous mucosa in the antrum. Biopsied. ?- Normal cardia, gastric fundus, gastric body, incisura and prepyloric region of the  stomach. Biopsied. ?- Esophagogastric landmarks identified. ?- Normal gastroesophageal junction and esophagus. ? ?DIAGNOSIS:  ?A.  STOMACH; COLD BIOPSY:  ?- ANTRAL AND OXYNTIC MUCOSA WITH MILD CHRONIC INACTIVE GASTRITIS.  ?- OXYNTIC MUCOSA WITH PROTON PUMP INHIBITOR EFFECT.  ?- NEGATIVE FOR INTESTINAL METAPLASIA, DYSPLASIA, AND MALIGNANCY.  ? ?Colonoscopy 03/30/2019 ?- The examined portion of the ileum was normal. ?- The entire examined colon is normal. ?- Non-bleeding external hemorrhoids. ?- No specimens collected. ? ?Denies family history of esophageal cancer, stomach cancer, colon cancer ? ?Past Medical History:  ?Diagnosis Date  ? Allergy   ? Anemia   ? Arthritis   ? spine  ? Bilateral leg edema   ? Chest pain 12/15/2014  ? Chronic sciatica 12/10/2014  ? Essential hypertension 12/10/2014  ? Gastroesophageal reflux disease without esophagitis   ? Mixed hyperlipidemia   ? Obesity (BMI 30.0-34.9) 12/10/2014  ? Sleep apnea   ? waiting for cpap  ? Type 2 diabetes mellitus without complication (HCC) 12/10/2014  ? Umbilical hernia   ? ? ?Past Surgical History:  ?Procedure Laterality Date  ? COLONOSCOPY WITH PROPOFOL N/A 03/30/2019  ? Procedure: COLONOSCOPY WITH PROPOFOL;  Surgeon: Toney Reil, MD;  Location[facility] ENDOSCOPY;  Service: Gastroenterology;  Laterality: N/A;  ? ESOPHAGOGASTRODUODENOSCOPY (EGD) WITH PROPOFOL N/A 03/12/2017  ? Procedure: ESOPHAGOGASTRODUODENOSCOPY (EGD) WITH PROPOFOL;  Surgeon: Toney Reil, MD;  Location: [facility] ENDOSCOPY;  Service: Gastroenterology;  Laterality: N/A;  ? ESOPHAGOGASTRODUODENOSCOPY (EGD) WITH PROPOFOL N/A 03/30/2019  ? Procedure: ESOPHAGOGASTRODUODENOSCOPY (EGD) WITH PROPOFOL;  Surgeon: Toney Reil, MD;  Location: [facility] ENDOSCOPY;  Service:  Gastroenterology;  Laterality: N/A;  ? HERNIA REPAIR    ? INCISIONAL HERNIA REPAIR N/A 11/29/2020  ? Procedure: HERNIA REPAIR INCISIONAL, open;  Surgeon: Leafy RoPabon, Diego F, MD;  Location: [facility] ORS;  Service: General;   Laterality: N/A;  ? ORIF ANKLE FRACTURE Left 07/30/2019  ? Procedure: OPEN REDUCTION INTERNAL FIXATION (ORIF) OF LEFT BIMALLEOLAR ANKLE FRACTURE;  Surgeon: Signa KellPatel, Sunny, MD;  [facility];  Service: Orthopedics;  Laterality: Left;  Diabetic - oral meds  ? SUPRACERVICAL ABDOMINAL HYSTERECTOMY    ? UMBILICAL HERNIA REPAIR N/A 05/01/2017  ? Procedure: HERNIA REPAIR UMBILICAL ADULT;  Surgeon: Ancil Linseyavis, Jason Evan, MD;  Location: [facility] ORS;  Service: General;  Laterality: N/A;  ? XI ROBOTIC ASSISTED VENTRAL HERNIA N/A 10/06/2019  ? Procedure: XI ROBOTIC ASSISTED VENTRAL HERNIA;  Surgeon: Leafy RoPabon, Diego F, MD;  Location: [facility] ORS;  Service: General;  Laterality: N/A;  ? ? ? ?Current Outpatient Medications:  ?  Accu-Chek Softclix Lancets lancets, TEST TWICE DAILY, Disp: 100 each, Rfl: 12 ?  aspirin EC 81 MG tablet, Take 81 mg by mouth daily. Swallow whole., Disp: , Rfl:  ?  atorvastatin (LIPITOR) 20 MG tablet, Take 1 tablet (20 mg total) by mouth daily. TAKE 1 TABLET(20 MG) BY MOUTH DAILY, Disp: 90 tablet, Rfl: 1 ?  Cholecalciferol (VITAMIN D-3) 5000 units TABS, Take 5,000 Units by mouth daily., Disp: , Rfl:  ?  DULoxetine (CYMBALTA) 30 MG capsule, Take 30 mg by mouth daily. (Take with 60mg  for a total dose of 90mg ), Disp: , Rfl:  ?  DULoxetine (CYMBALTA) 60 MG capsule, Take 1 capsule (60 mg total) by mouth daily. (Take with 30mg  for total dose of 90mg ), Disp: 90 capsule, Rfl: 1 ?  EPINEPHrine 0.3 mg/0.3 mL IJ SOAJ injection, Inject 0.3 mg into the muscle as needed for anaphylaxis., Disp: 1 each, Rfl: 12 ?  fexofenadine (ALLERGY RELIEF) 180 MG tablet, Take 1 tablet (180 mg total) by mouth daily., Disp: 90 tablet, Rfl: 3 ?  gabapentin (NEURONTIN) 100 MG capsule, Take 100 mg by mouth 3 (three) times daily., Disp: , Rfl:  ?  gabapentin (NEURONTIN) 300 MG capsule, TAKE 1 CAPSULE(300 MG) BY MOUTH THREE TIMES DAILY, Disp: 270 capsule, Rfl: 1 ?  ibuprofen (ADVIL) 800 MG tablet, Take 1 tablet (800 mg total) by mouth  every 8 (eight) hours as needed., Disp: 90 tablet, Rfl: 3 ?  ketoconazole (NIZORAL) 2 % cream, Apply 1 application topically daily as needed for irritation., Disp: 60 g, Rfl: 1 ?  Magnesium 500 MG TABS, Take 500 mg by mouth daily., Disp: , Rfl:  ?  metFORMIN (GLUCOPHAGE) 500 MG tablet, Take 2 tablets (1,000 mg total) by mouth 2 (two) times daily with a meal., Disp: 360 tablet, Rfl: 1 ?  montelukast (SINGULAIR) 10 MG tablet, Take 1 tablet (10 mg total) by mouth at bedtime. TAKE 1 TABLET(10 MG) BY MOUTH AT BEDTIME, Disp: 90 tablet, Rfl: 1 ?  omeprazole (PRILOSEC) 40 MG capsule, TAKE 1 CAPSULE(40 MG) BY MOUTH BID, Disp: 180 capsule, Rfl: 1 ?  potassium chloride (KLOR-CON M) 10 MEQ tablet, TAKE 3 TABLETS(30 MEQ) BY MOUTH DAILY, Disp: 270 tablet, Rfl: 1 ?  sucralfate (CARAFATE) 1 g tablet, Take 1 tablet (1 g total) by mouth 4 (four) times daily -  with meals and at bedtime. TAKE 1 TABLET(1 GRAM) BY MOUTH THREE TIMES DAILY, Disp: 270 tablet, Rfl: 1 ?  vitamin B-12 (CYANOCOBALAMIN) 1000 MCG tablet, Take 1,000 mcg by mouth 2 (two) times daily., Disp: ,  Rfl:  ?  Pancrelipase, Lip-Prot-Amyl, (ZENPEP) 40000-126000 units CPEP, Take 2 capsules with the first bite of each meal and 1 capsule with the first bite of each snack, Disp: 240 capsule, Rfl: 2 ? ? ?Family History  ?Problem Relation Age of Onset  ? Diabetes Mother   ? Cancer Mother   ?     breast and stomach  ? Breast cancer Mother 62  ? Dementia Mother   ? Heart disease Father   ? Kidney disease Father   ?     dialysis  ? Cancer Father   ?     colon  ? Diabetes Father   ? Hypertension Sister   ? Hypertension Brother   ? Hypertension Daughter   ? Congestive Heart Failure Maternal Grandmother   ? Diabetes Maternal Grandmother   ? Hypertension Maternal Grandmother   ?  ? ?Social History  ? ?Tobacco Use  ? Smoking status: Never  ? Smokeless tobacco: Never  ?Vaping Use  ? Vaping Use: Never used  ?Substance Use Topics  ? Alcohol use: Yes  ?  Alcohol/week: 3.0 standard drinks  ?   Types: 3 Glasses of wine per week  ? Drug use: No  ? ? ?Allergies as of 09/04/2021 - Review Complete 09/04/2021  ?Allergen Reaction Noted  ? Benazepril Swelling 10/07/2020  ? Ivp dye [iodinated contrast media]

## 2021-09-05 ENCOUNTER — Other Ambulatory Visit: Payer: Self-pay | Admitting: Family Medicine

## 2021-09-05 DIAGNOSIS — E1140 Type 2 diabetes mellitus with diabetic neuropathy, unspecified: Secondary | ICD-10-CM

## 2021-09-06 DIAGNOSIS — G5603 Carpal tunnel syndrome, bilateral upper limbs: Secondary | ICD-10-CM | POA: Diagnosis not present

## 2021-09-06 NOTE — Telephone Encounter (Signed)
Requested medication (s) are due for refill today - no ? ?Requested medication (s) are on the active medication list -no ? ?Future visit scheduled -yes ? ?Last refill: unsure- medication no longer listed on current medication list ? ?Notes to clinic: Request for discontinued medication  ? ?Requested Prescriptions  ?Pending Prescriptions Disp Refills  ? JARDIANCE 25 MG TABS tablet [Pharmacy Med Name: JARDIANCE 25MG TABLETS] 90 tablet 1  ?  Sig: TAKE 1 TABLET(25 MG) BY MOUTH DAILY  ?  ? Endocrinology:  Diabetes - SGLT2 Inhibitors Passed - 09/05/2021  7:02 AM  ?  ?  Passed - Cr in normal range and within 360 days  ?  Creatinine, Ser  ?Date Value Ref Range Status  ?06/06/2021 0.86 0.57 - 1.00 mg/dL Final  ?   ?  ?  Passed - HBA1C is between 0 and 7.9 and within 180 days  ?  HB A1C (BAYER DCA - WAIVED)  ?Date Value Ref Range Status  ?06/06/2021 6.1 (H) 4.8 - 5.6 % Final  ?  Comment:  ?           Prediabetes: 5.7 - 6.4 ?         Diabetes: >6.4 ?         Glycemic control for adults with diabetes: <7.0 ?  ?   ?  ?  Passed - eGFR in normal range and within 360 days  ?  GFR calc Af Amer  ?Date Value Ref Range Status  ?02/18/2020 89 >59 mL/min/1.73 Final  ?  Comment:  ?  **In accordance with recommendations from the NKF-ASN Task force,** ?  [facility] is in the process of updating its eGFR calculation to the ?  2021 CKD-EPI creatinine equation that estimates kidney function ?  without a race variable. ?  ? ?GFR, Estimated  ?Date Value Ref Range Status  ?01/31/2021 >60 >60 mL/min Final  ?  Comment:  ?  (NOTE) ?Calculated using the CKD-EPI Creatinine Equation (2021) ?  ? ?eGFR  ?Date Value Ref Range Status  ?06/06/2021 76 >59 mL/min/1.73 Final  ?   ?  ?  Passed - Valid encounter within last 6 months  ?  Recent Outpatient Visits   ? ?      ? 3 weeks ago OSA (obstructive sleep apnea)  ? [place], [place] P, DO  ? 3 months ago Type 2 diabetes mellitus with diabetic neuropathy, without long-term current use of  insulin ([place])  ? [place] P, DO  ? 3 months ago Neck pain  ? [place], Megan P, DO  ? 4 months ago Dizziness  ? [place], Henrine Screws T, NP  ? 6 months ago Routine general medical examination at a health care facility  ? [place], [place] P, DO  ? ?  ?  ?Future Appointments   ? ?        ? In 2 months Wynetta Emery, Barb Merino, DO [facility]  ? ?  ? ? ?  ?  ?  ? ? ? ?Requested Prescriptions  ?Pending Prescriptions Disp Refills  ? JARDIANCE 25 MG TABS tablet [Pharmacy Med Name: JARDIANCE 25MG TABLETS] 90 tablet 1  ?  Sig: TAKE 1 TABLET(25 MG) BY MOUTH DAILY  ?  ? Endocrinology:  Diabetes - SGLT2 Inhibitors Passed - 09/05/2021  7:02 AM  ?  ?  Passed - Cr in normal range and within 360 days  ?  Creatinine, Ser  ?Date Value Ref Range Status  ?06/06/2021 0.86 0.57 - 1.00 mg/dL Final  ?   ?  ?  Passed - HBA1C is between 0 and 7.9 and within 180 days  ?  HB A1C (BAYER DCA - WAIVED)  ?Date Value Ref Range Status  ?06/06/2021 6.1 (H) 4.8 - 5.6 % Final  ?  Comment:  ?           Prediabetes: 5.7 - 6.4 ?         Diabetes: >6.4 ?         Glycemic control for adults with diabetes: <7.0 ?  ?   ?  ?  Passed - eGFR in normal range and within 360 days  ?  GFR calc Af Amer  ?Date Value Ref Range Status  ?02/18/2020 89 >59 mL/min/1.73 Final  ?  Comment:  ?  **In accordance with recommendations from the NKF-ASN Task force,** ?  [facility] is in the process of updating its eGFR calculation to the ?  2021 CKD-EPI creatinine equation that estimates kidney function ?  without a race variable. ?  ? ?GFR, Estimated  ?Date Value Ref Range Status  ?01/31/2021 >60 >60 mL/min Final  ?  Comment:  ?  (NOTE) ?Calculated using the CKD-EPI Creatinine Equation (2021) ?  ? ?eGFR  ?Date Value Ref Range Status  ?06/06/2021 76 >59 mL/min/1.73 Final  ?   ?  ?  Passed - Valid encounter within last 6 months  ?  Recent Outpatient Visits   ? ?      ? 3 weeks ago  OSA (obstructive sleep apnea)  ? [place], [place] P, DO  ? 3 months ago Type 2 diabetes mellitus with diabetic neuropathy, without long-term current use of insulin ([place])  ? [place] P, DO  ? 3 months ago Neck pain  ? [place], Megan P, DO  ? 4 months ago Dizziness  ? [place], Henrine Screws T, NP  ? 6 months ago Routine general medical examination at a health care facility  ? [place], [place] P, DO  ? ?  ?  ?Future Appointments   ? ?        ? In 2 months Wynetta Emery, Barb Merino, DO [facility]  ? ?  ? ? ?  ?  ?  ? ? ? ?

## 2021-09-07 ENCOUNTER — Other Ambulatory Visit: Payer: Self-pay | Admitting: Family Medicine

## 2021-09-07 NOTE — Telephone Encounter (Signed)
Requested medication (s) are due for refill today: yes  Requested medication (s) are on the active medication list: yes  Last refill:  03/06/21  Future visit scheduled: yes  Notes to clinic:  Unable to refill per protocol, cannot delegate.     Requested Prescriptions  Pending Prescriptions Disp Refills   ketoconazole (NIZORAL) 2 % cream [Pharmacy Med Name: KETOCONAZOLE 2% CREAM 60GM] 60 g 1    Sig: APPLY TOPICALLY TO THE AFFECTED AREA DAILY AS NEEDED FOR IRRITATION     Not Delegated - Over the Counter: OTC 2 Failed - 09/07/2021  7:24 AM      Failed - This refill cannot be delegated      Passed - Valid encounter within last 12 months    Recent Outpatient Visits           3 weeks ago OSA (obstructive sleep apnea)   [facility], Megan P, DO   3 months ago Type 2 diabetes mellitus with diabetic neuropathy, without long-term current use of insulin (HCC)   [facility] [place], [place], DO   3 months ago Neck pain   [facility] [place], [place], DO   4 months ago Dizziness   [facility] [place], [place] T, NP   6 months ago Routine general medical examination at a health care facility   [facility] [place], [place], DO       Future Appointments             In 2 months Laural Benes, Oralia Rud, DO [facility]

## 2021-09-14 ENCOUNTER — Telehealth: Payer: Self-pay | Admitting: Family Medicine

## 2021-09-14 ENCOUNTER — Other Ambulatory Visit: Payer: Self-pay

## 2021-09-14 DIAGNOSIS — Z1231 Encounter for screening mammogram for malignant neoplasm of breast: Secondary | ICD-10-CM

## 2021-09-14 NOTE — Telephone Encounter (Signed)
Mammogram ordered, patient is aware to call and schedule appointment with [facility].

## 2021-09-14 NOTE — Telephone Encounter (Signed)
Referral Request - Has patient seen PCP for this complaint? Yes.   *If NO, is insurance requiring patient see PCP for this issue before PCP can refer them? Referral for which specialty: Mammogram Preferred provider/office: [facility] Reason for referral: it is time for her yearly mammogram. [facility] sent her a letter.

## 2021-09-19 ENCOUNTER — Telehealth: Payer: BC Managed Care – PPO

## 2021-09-19 ENCOUNTER — Telehealth: Payer: Self-pay

## 2021-09-19 NOTE — Telephone Encounter (Signed)
  Care Management   Follow Up Note   09/19/2021 Name: Lisa Galvan MRN: [id number] DOB: 11/15/1960   Referred by: Dorcas Carrow, DO Reason for referral : Care Coordination (RNCM: Follow up for Chronic Disease Management and Care Coordination Needs )   An unsuccessful telephone outreach was attempted today. The patient was referred to the case management team for assistance with care management and care coordination.   Follow Up Plan: A HIPPA compliant phone message was left for the patient providing contact information and requesting a return call.   Alto Denver RN, MSN, CCM Community Care Coordinator [place]  [facility] Mobile: [phone]

## 2021-09-25 NOTE — Progress Notes (Unsigned)
PROVIDER NOTE: Interpretation of information contained herein should be left to medically-trained personnel. Specific patient instructions are provided elsewhere under "Patient Instructions" section of medical record. This document was created in part using STT-dictation technology, any transcriptional errors that may result from this process are unintentional.  Patient: Lisa Galvan Type: Established DOB: [birth year] MRN: [id number] PCP: [name], DO  Service: Procedure DOS: 09/26/2021 Setting: Ambulatory Location: Ambulatory outpatient facility Delivery: Face-to-face Provider: Gaspar Cola, MD Specialty: Interventional Pain Management Specialty designation: 09 Location: Outpatient facility Ref. Prov.: [name], DO    Primary Reason for Visit: Interventional Pain Management Treatment. CC: No chief complaint on file.   Procedure(s):     Procedure 1: Type: Lumbar Trans-Foraminal Epidural Steroid Injection (TFESI) #1  Laterality: Bilateral (-50) Paravertebral Level: L5   Target: Foraminal peri-neural region, subarticular lateral recess area, and anterior intraspinal epidural space.  Procedure 2: Type: Lumbar Epidural Steroid Injection (LESI) (Inter-laminar) #1  Laterality: Left (-LT) Paravertebral Level: L4-5   Target: Posterior intraspinal epidural space Imaging: Fluoroscopic guidance Anesthesia: Local anesthesia (1-2% Lidocaine) Anxiolysis: None                 Sedation: None. DOS: 09/26/2021  Performed by: Gaspar Cola, MD  Purpose: Diagnostic/Therapeutic Indications: Low back/lower extremity pain severe enough to impact quality of life or function. Rationale (medical necessity): procedure needed and proper for the diagnosis and/or treatment of Lisa Galvan's medical symptoms and needs. No diagnosis found. NAS-11 Pain score:   Pre-procedure:  /10   Post-procedure:  /10      Region: Lumbosacral Approach: Percutaneous  Type of procedure: Perineural  injection   Position / Prep / Materials:  Position: Prone with head of the table was raised to facilitate breathing.  Prep solution: DuraPrep (Iodine Povacrylex [0.7% available iodine] and Isopropyl Alcohol, 74% w/w) Prep Area: Entire posterior thoracolumbar & lumbosacral region  Procedure 1 Materials:  Tray: Block Needle(s):  Type: Epidural & Spinal  Gauge (G): 25  Length: 5-in  Qty: 2 Materials:  Tray: Epidural tray Needle(s):  Type: Epidural needle          Gauge (G):  17 Length: Regular (3.5-in) Qty: 1  Pre-op H&P Assessment:  Lisa Galvan is a 61 y.o. (year old), female patient, seen today for interventional treatment. She  has a past surgical history that includes Esophagogastroduodenoscopy (egd) with propofol (N/A, 03/12/2017); Umbilical hernia repair (N/A, 05/01/2017); Hernia repair; Esophagogastroduodenoscopy (egd) with propofol (N/A, 03/30/2019); Colonoscopy with propofol (N/A, 03/30/2019); ORIF ankle fracture (Left, 07/30/2019); XI robotic assisted ventral hernia (N/A, 10/06/2019); Incisional hernia repair (N/A, 11/29/2020); and Supracervical abdominal hysterectomy. Lisa Galvan has a current medication list which includes the following prescription(s): ketoconazole, accu-chek softclix lancets, aspirin ec, atorvastatin, vitamin d-3, duloxetine, duloxetine, epinephrine, fexofenadine, gabapentin, gabapentin, ibuprofen, magnesium, metformin, montelukast, omeprazole, zenpep, potassium chloride, sucralfate, and vitamin b-12. Her primarily concern today is the No chief complaint on file.  Initial Vital Signs:  Pulse/HCG Rate:    Temp:   Resp:   BP:   SpO2:    BMI: Estimated body mass index is 32.56 kg/m as calculated from the following:   Height as of 09/04/21: 5\' 2"  (1.575 m).   Weight as of 09/04/21: 178 lb (80.7 kg).  Risk Assessment: Allergies: Reviewed. She is allergic to benazepril, ivp dye [iodinated contrast media], lidocaine, lyrica [pregabalin], shellfish allergy,  shellfish-derived products, trulicity [dulaglutide], and ace inhibitors.  Allergy Precautions: None required Coagulopathies: Reviewed. None identified.  Blood-thinner therapy: None at this time Active Infection(s):  Reviewed. None identified. Lisa Galvan is afebrile  Site Confirmation: Lisa Galvan was asked to confirm the procedure and laterality before marking the site Procedure checklist: Completed Consent: Before the procedure and under the influence of no sedative(s), amnesic(s), or anxiolytics, the patient was informed of the treatment options, risks and possible complications. To fulfill our ethical and legal obligations, as recommended by the American Medical Association's Code of Ethics, I have informed the patient of my clinical impression; the nature and purpose of the treatment or procedure; the risks, benefits, and possible complications of the intervention; the alternatives, including doing nothing; the risk(s) and benefit(s) of the alternative treatment(s) or procedure(s); and the risk(s) and benefit(s) of doing nothing. The patient was provided information about the general risks and possible complications associated with the procedure. These may include, but are not limited to: failure to achieve desired goals, infection, bleeding, organ or nerve damage, allergic reactions, paralysis, and death. In addition, the patient was informed of those risks and complications associated to Spine-related procedures, such as failure to decrease pain; infection (i.e.: Meningitis, epidural or intraspinal abscess); bleeding (i.e.: epidural hematoma, subarachnoid hemorrhage, or any other type of intraspinal or peri-dural bleeding); organ or nerve damage (i.e.: Any type of peripheral nerve, nerve root, or spinal cord injury) with subsequent damage to sensory, motor, and/or autonomic systems, resulting in permanent pain, numbness, and/or weakness of one or several areas of the body; allergic reactions; (i.e.:  anaphylactic reaction); and/or death. Furthermore, the patient was informed of those risks and complications associated with the medications. These include, but are not limited to: allergic reactions (i.e.: anaphylactic or anaphylactoid reaction(s)); adrenal axis suppression; blood sugar elevation that in diabetics may result in ketoacidosis or comma; water retention that in patients with history of congestive heart failure may result in shortness of breath, pulmonary edema, and decompensation with resultant heart failure; weight gain; swelling or edema; medication-induced neural toxicity; particulate matter embolism and blood vessel occlusion with resultant organ, and/or nervous system infarction; and/or aseptic necrosis of one or more joints. Finally, the patient was informed that Medicine is not an exact science; therefore, there is also the possibility of unforeseen or unpredictable risks and/or possible complications that may result in a catastrophic outcome. The patient indicated having understood very clearly. We have given the patient no guarantees and we have made no promises. Enough time was given to the patient to ask questions, all of which were answered to the patient's satisfaction. Lisa Galvan has indicated that she wanted to continue with the procedure. Attestation: I, the ordering provider, attest that I have discussed with the patient the benefits, risks, side-effects, alternatives, likelihood of achieving goals, and potential problems during recovery for the procedure that I have provided informed consent. Date  Time: {CHL [facility]-PAIN TIME CHOICES:21018001}  Pre-Procedure Preparation:  Monitoring: As per clinic protocol. Respiration, ETCO2, SpO2, BP, heart rate and rhythm monitor placed and checked for adequate function Safety Precautions: Patient was assessed for positional comfort and pressure points before starting the procedure. Time-out: I initiated and conducted the "Time-out" before  starting the procedure, as per protocol. The patient was asked to participate by confirming the accuracy of the "Time Out" information. Verification of the correct person, site, and procedure were performed and confirmed by me, the nursing staff, and the patient. "Time-out" conducted as per Joint Commission's Universal Protocol (UP.01.01.01). Time:    Description/Narrative of Procedure:          Rationale (medical necessity): procedure needed and proper for the diagnosis  and/or treatment of the patient's medical symptoms and needs. Procedural Technique Safety Precautions: Aspiration looking for blood return was conducted prior to all injections. At no point did we inject any substances, as a needle was being advanced. No attempts were made at seeking any paresthesias. Safe injection practices and needle disposal techniques used. Medications properly checked for expiration dates. SDV (single dose vial) medications used. Description of the Procedure: Protocol guidelines were followed. The patient was assisted into a comfortable position. The target area was identified and the area prepped in the usual manner. Skin & deeper tissues infiltrated with local anesthetic. Appropriate amount of time allowed to pass for local anesthetics to take effect. The procedure needles were then advanced to the target area. Proper needle placement secured. Negative aspiration confirmed. Solution injected in intermittent fashion, asking for systemic symptoms every 0.5cc of injectate. The needles were then removed and the area cleansed, making sure to leave some of the prepping solution back to take advantage of its long term bactericidal properties.  Technical description of procedure:  Description of Procedure #1:  Target Area: The inferior and lateral portion of the pedicle, just lateral to a line created by the 6:00 position of the pedicle and the superior articular process of the vertebral body below. On the lateral view,  this target lies just posterior to the anterior aspect of the lamina and posterior to the midpoint created between the anterior and the posterior aspect of the neural foramina. Approach: Posterior paravertebral approach. Area Prepped: Entire Posterior Lumbosacral Region DuraPrep (Iodine Povacrylex [0.7% available iodine] and Isopropyl Alcohol, 74% w/w) Safety Precautions: Aspiration looking for blood return was conducted prior to all injections. At no point did we inject any substances, as a needle was being advanced. No attempts were made at seeking any paresthesias. Safe injection practices and needle disposal techniques used. Medications properly checked for expiration dates. SDV (single dose vial) medications used.  Description of the Procedure: Protocol guidelines were followed. The patient was placed in position over the fluoroscopy table. The target area was identified and the area prepped in the usual manner. Skin & deeper tissues infiltrated with local anesthetic. Appropriate amount of time allowed to pass for local anesthetics to take effect. The procedure needles were then advanced to the target area. Proper needle placement secured. Negative aspiration confirmed. Solution injected in intermittent fashion, asking for systemic symptoms every 0.2cc of injectate. The needles were then removed and the area cleansed, making sure to leave some of the prepping solution back to take advantage of its long term bactericidal properties.  Start Time:   hrs.  Description of Procedure #2:  Target Area: The  interlaminar space, initially targeting the lower border of the superior vertebral body lamina. Approach: Posterior paramedial approach. Area Prepped: Same as above Prepping solution: Same as above Safety Precautions: Same as above  Description of the Procedure: Protocol guidelines were followed. The patient was placed in position over the fluoroscopy table. The target area was identified and the  area prepped in the usual manner. Skin & deeper tissues infiltrated with local anesthetic. Appropriate amount of time allowed to pass for local anesthetics to take effect. The procedure needle was introduced through the skin, ipsilateral to the reported pain, and advanced to the target area. Bone was contacted and the needle walked caudad, until the lamina was cleared. The ligamentum flavum was engaged and loss-of-resistance technique used as the epidural needle was advanced. The epidural space was identified using "loss-of-resistance technique" with 2-3 ml of  PF-NaCl (0.9% NSS), in a 5cc LOR glass syringe. Proper needle placement secured. Negative aspiration confirmed. Solution injected in intermittent fashion, asking for systemic symptoms every 0.5cc of injectate. The needles were then removed and the area cleansed, making sure to leave some of the prepping solution back to take advantage of its long term bactericidal properties.  There were no vitals filed for this visit.   End Time:   hrs.  Imaging Guidance (Spinal):          Type of Imaging Technique: Fluoroscopy Guidance (Spinal) Indication(s): Assistance in needle guidance and placement for procedures requiring needle placement in or near specific anatomical locations not easily accessible without such assistance. Exposure Time: Please see nurses notes. Contrast: Before injecting any contrast, we confirmed that the patient did not have an allergy to iodine, shellfish, or radiological contrast. Once satisfactory needle placement was completed at the desired level, radiological contrast was injected. Contrast injected under live fluoroscopy. No contrast complications. See chart for type and volume of contrast used. Fluoroscopic Guidance: I was personally present during the use of fluoroscopy. "Tunnel Vision Technique" used to obtain the best possible view of the target area. Parallax error corrected before commencing the procedure.  "Direction-depth-direction" technique used to introduce the needle under continuous pulsed fluoroscopy. Once target was reached, antero-posterior, oblique, and lateral fluoroscopic projection used confirm needle placement in all planes. Images permanently stored in EMR. Interpretation: I personally interpreted the imaging intraoperatively. Adequate needle placement confirmed in multiple planes. Appropriate spread of contrast into desired area was observed. No evidence of afferent or efferent intravascular uptake. No intrathecal or subarachnoid spread observed. Permanent images saved into the patient's record.  Post-operative Assessment:  Post-procedure Vital Signs:  Pulse/HCG Rate:    Temp:   Resp:   BP:   SpO2:    EBL: None  Complications: No immediate post-treatment complications observed by team, or reported by patient.  Note: The patient tolerated the entire procedure well. A repeat set of vitals were taken after the procedure and the patient was kept under observation following institutional policy, for this type of procedure. Post-procedural neurological assessment was performed, showing return to baseline, prior to discharge. The patient was provided with post-procedure discharge instructions, including a section on how to identify potential problems. Should any problems arise concerning this procedure, the patient was given instructions to immediately contact us, at any time, without hesitation. In any case, we plan to contact the patient by telephone for a follow-up status report regarding this interventional procedure.  Comments:  No additional relevant information.  Plan of Care  Orders:  No orders of the defined types were placed in this encounter.  Chronic Opioid Analgesic:  No opioid analgesics prescribed by our practice.   Medications ordered for procedure: No orders of the defined types were placed in this encounter.  Medications administered: Sameera Sprunk had no  medications administered during this visit.  See the medical record for exact dosing, route, and time of administration.  Follow-up plan:   No follow-ups on file.       Interventional Therapies  Risk  Complexity Considerations:   Estimated body mass index is 26.57 kg/m as calculated from the following:   Height as of 01/31/21: 5\' 3"  (1.6 m).   Weight as of 01/31/21: 150 lb (68 kg). WNL   Planned  Pending:   Therapeutic left L4-5 LESI #1 + bilateral L5 TFESI #1    Under consideration:   Therapeutic caudal ESI #3  Therapeutic Qutenza treatment of  her diabetic peripheral neuropathy.   Completed:   Diagnostic/therapeutic right CESI #1 (08/03/2021)  Palliative right lumbar facet RFA x1 (06/02/2019) (100/100/95)  Palliative left lumbar facet RFA x1 (06/30/2019) (100/100/95)  Palliative bilateral lumbar facet MBB x2 (04/21/2019) (100/100/40/40)  Palliative right sacroiliac joint Blk x1 (04/21/2019) (100/100/60/40)  Diagnostic midline to right caudal ESI x2 (07/04/2021) (75/75/75/75)    Completed by other providers:   Diagnostic/therapeutic right L4-5 LESI x1 (done by Dr. Primus Bravo on 12/29/2014)   Therapeutic  Palliative (PRN) options:   Palliative right lumbar facet RFA #2 (last done 06/02/2019) (100/100/95) Palliative left lumbar facet RFA #2 (last done 06/30/2019) (100/100/95) Palliative bilateral lumbar facet block #3 (100/100/40/40) Palliative right sacroiliac joint block #2  (100/100/60/40) Diagnostic caudal ESI #2 (50/50/75)     Recent Visits Date Type Provider Dept  08/22/21 Office Visit Milinda Pointer, MD [facility]  08/03/21 Procedure visit Milinda Pointer, MD [facility]  07/25/21 Office Visit Milinda Pointer, MD [facility]  07/04/21 Procedure visit Milinda Pointer, MD [facility]  Showing recent visits within past 90 days and meeting all other requirements Future Appointments Date Type Provider Dept  09/26/21 Appointment  Milinda Pointer, MD [facility]  Showing future appointments within next 90 days and meeting all other requirements  Disposition: Discharge home  Discharge (Date  Time): 09/26/2021;   hrs.   Primary Care Physician: [name], DO Location: [facility] Note by: Gaspar Cola, MD Date: 09/26/2021; Time: 2:19 PM  Disclaimer:  Medicine is not an Chief Strategy Officer. The only guarantee in medicine is that nothing is guaranteed. It is important to note that the decision to proceed with this intervention was based on the information collected from the patient. The Data and conclusions were drawn from the patient's questionnaire, the interview, and the physical examination. Because the information was provided in large part by the patient, it cannot be guaranteed that it has not been purposely or unconsciously manipulated. Every effort has been made to obtain as much relevant data as possible for this evaluation. It is important to note that the conclusions that lead to this procedure are derived in large part from the available data. Always take into account that the treatment will also be dependent on availability of resources and existing treatment guidelines, considered by other Pain Management Practitioners as being common knowledge and practice, at the time of the intervention. For Medico-Legal purposes, it is also important to point out that variation in procedural techniques and pharmacological choices are the acceptable norm. The indications, contraindications, technique, and results of the above procedure should only be interpreted and judged by a Board-Certified Interventional Pain Specialist with extensive familiarity and expertise in the same exact procedure and technique.

## 2021-09-26 ENCOUNTER — Other Ambulatory Visit: Payer: Self-pay

## 2021-09-26 ENCOUNTER — Encounter: Payer: Self-pay | Admitting: Pain Medicine

## 2021-09-26 ENCOUNTER — Ambulatory Visit: Payer: BC Managed Care – PPO | Attending: Pain Medicine | Admitting: Pain Medicine

## 2021-09-26 ENCOUNTER — Ambulatory Visit
Admission: RE | Admit: 2021-09-26 | Discharge: 2021-09-26 | Disposition: A | Discharge status: Home / Self Care | Payer: BC Managed Care – PPO | Source: Ambulatory Visit | Attending: Pain Medicine | Admitting: Pain Medicine

## 2021-09-26 VITALS — BP 131/84 | HR 101 | Temp 97.1°F | Resp 22 | Ht 62.0 in | Wt 174.0 lb

## 2021-09-26 DIAGNOSIS — R29898 Other symptoms and signs involving the musculoskeletal system: Secondary | ICD-10-CM | POA: Diagnosis not present

## 2021-09-26 DIAGNOSIS — Z888 Allergy status to other drugs, medicaments and biological substances status: Secondary | ICD-10-CM | POA: Diagnosis not present

## 2021-09-26 DIAGNOSIS — M79605 Pain in left leg: Secondary | ICD-10-CM | POA: Insufficient documentation

## 2021-09-26 DIAGNOSIS — M5417 Radiculopathy, lumbosacral region: Secondary | ICD-10-CM | POA: Diagnosis not present

## 2021-09-26 DIAGNOSIS — Z91013 Allergy to seafood: Secondary | ICD-10-CM

## 2021-09-26 DIAGNOSIS — M51369 Other intervertebral disc degeneration, lumbar region without mention of lumbar back pain or lower extremity pain: Secondary | ICD-10-CM

## 2021-09-26 DIAGNOSIS — R200 Anesthesia of skin: Secondary | ICD-10-CM | POA: Diagnosis not present

## 2021-09-26 DIAGNOSIS — Z91041 Radiographic dye allergy status: Secondary | ICD-10-CM

## 2021-09-26 DIAGNOSIS — R202 Paresthesia of skin: Secondary | ICD-10-CM | POA: Diagnosis not present

## 2021-09-26 DIAGNOSIS — Z87892 Personal history of anaphylaxis: Secondary | ICD-10-CM

## 2021-09-26 DIAGNOSIS — M4310 Spondylolisthesis, site unspecified: Secondary | ICD-10-CM

## 2021-09-26 DIAGNOSIS — G8929 Other chronic pain: Secondary | ICD-10-CM | POA: Diagnosis not present

## 2021-09-26 DIAGNOSIS — M5136 Other intervertebral disc degeneration, lumbar region: Secondary | ICD-10-CM | POA: Insufficient documentation

## 2021-09-26 DIAGNOSIS — M79604 Pain in right leg: Secondary | ICD-10-CM | POA: Diagnosis not present

## 2021-09-26 MED ORDER — DEXAMETHASONE SODIUM PHOSPHATE 10 MG/ML IJ SOLN
INTRAMUSCULAR | Status: AC
  Filled 2021-09-26: qty 1

## 2021-09-26 MED ORDER — DEXAMETHASONE SODIUM PHOSPHATE 10 MG/ML IJ SOLN
20.0000 mg | Freq: Once | INTRAMUSCULAR | Status: AC
  Administered 2021-09-26: 20 mg
  Filled 2021-09-26: qty 2

## 2021-09-26 MED ORDER — ROPIVACAINE HCL 2 MG/ML IJ SOLN
2.0000 mL | Freq: Once | INTRAMUSCULAR | Status: AC
  Administered 2021-09-26: 2 mL via EPIDURAL

## 2021-09-26 MED ORDER — LACTATED RINGERS IV SOLN: 1000.0000 mL | Freq: Once | INTRAVENOUS | Status: DC

## 2021-09-26 MED ORDER — LIDOCAINE HCL 2 % IJ SOLN
INTRAMUSCULAR | Status: AC
  Filled 2021-09-26: qty 20

## 2021-09-26 MED ORDER — SODIUM CHLORIDE 0.9% FLUSH
2.0000 mL | Freq: Once | INTRAVENOUS | Status: AC
  Administered 2021-09-26: 2 mL

## 2021-09-26 MED ORDER — PENTAFLUOROPROP-TETRAFLUOROETH EX AERO
INHALATION_SPRAY | Freq: Once | CUTANEOUS | Status: AC
  Administered 2021-09-26: 30 via TOPICAL

## 2021-09-26 MED ORDER — ROPIVACAINE HCL 2 MG/ML IJ SOLN
INTRAMUSCULAR | Status: AC
  Filled 2021-09-26: qty 20

## 2021-09-26 MED ORDER — MIDAZOLAM HCL 5 MG/5ML IJ SOLN: 0.5000 mg | Freq: Once | INTRAMUSCULAR | Status: DC

## 2021-09-26 MED ORDER — TRIAMCINOLONE ACETONIDE 40 MG/ML IJ SUSP
40.0000 mg | Freq: Once | INTRAMUSCULAR | Status: AC
  Administered 2021-09-26: 40 mg

## 2021-09-26 MED ORDER — TRIAMCINOLONE ACETONIDE 40 MG/ML IJ SUSP
INTRAMUSCULAR | Status: AC
  Filled 2021-09-26: qty 1

## 2021-09-26 MED ORDER — SODIUM CHLORIDE (PF) 0.9 % IJ SOLN
INTRAMUSCULAR | Status: AC
  Filled 2021-09-26: qty 20

## 2021-09-26 MED ORDER — LIDOCAINE HCL 2 % IJ SOLN: 20.0000 mL | Freq: Once | INTRAMUSCULAR | Status: DC

## 2021-09-26 NOTE — Patient Instructions (Signed)

## 2021-09-26 NOTE — Progress Notes (Signed)
Safety precautions to be maintained throughout the outpatient stay will include: orient to surroundings, keep bed in low position, maintain call bell within reach at all times, provide assistance with transfer out of bed and ambulation.  

## 2021-09-27 ENCOUNTER — Telehealth: Payer: Self-pay

## 2021-09-27 NOTE — Telephone Encounter (Signed)
Post procedure phone call.  Patient states she is doing OK 

## 2021-10-03 ENCOUNTER — Telehealth: Payer: Self-pay | Admitting: Family Medicine

## 2021-10-03 NOTE — Telephone Encounter (Signed)
PA initiated via CoverMyMeds for Omeprazole 40 mg capsules Key: [id number]  Waiting on determination.

## 2021-10-03 NOTE — Telephone Encounter (Signed)
Medication Refill - Medication: omeprazole (PRILOSEC) 40 MG capsule  Pt stated she would like to take it twice a day; however, the pharmacy advised her that the insurance would not approve it twice a day without a PA.  PA asked if she could possibly get a higher dose.  Has the patient contacted their pharmacy? Yes.   No, more refills.    (Agent: If yes, when and what did the pharmacy advise?)  Preferred Pharmacy (with phone number or street name):  [id number] [place], [place] - [address] AT [facility] OF [address]  [address] [place] [address]  Phone: [phone] Fax: [phone]  Hours: Not open 24 hours   Has the patient been seen for an appointment in the last year OR does the patient have an upcoming appointment? Yes.    Agent: Please be advised that RX refills may take up to 3 business days. We ask that you follow-up with your pharmacy.

## 2021-10-03 NOTE — Telephone Encounter (Signed)
Will initiate PA via CoverMyMeds.

## 2021-10-05 DIAGNOSIS — G4733 Obstructive sleep apnea (adult) (pediatric): Secondary | ICD-10-CM | POA: Diagnosis not present

## 2021-10-09 ENCOUNTER — Ambulatory Visit: Payer: BC Managed Care – PPO | Admitting: Licensed Clinical Social Worker

## 2021-10-09 DIAGNOSIS — G608 Other hereditary and idiopathic neuropathies: Secondary | ICD-10-CM | POA: Diagnosis not present

## 2021-10-09 DIAGNOSIS — G4733 Obstructive sleep apnea (adult) (pediatric): Secondary | ICD-10-CM | POA: Diagnosis not present

## 2021-10-09 DIAGNOSIS — R296 Repeated falls: Secondary | ICD-10-CM | POA: Diagnosis not present

## 2021-10-09 DIAGNOSIS — E538 Deficiency of other specified B group vitamins: Secondary | ICD-10-CM | POA: Diagnosis not present

## 2021-10-10 ENCOUNTER — Ambulatory Visit: Payer: BC Managed Care – PPO | Attending: Pain Medicine | Admitting: Pain Medicine

## 2021-10-10 DIAGNOSIS — R29898 Other symptoms and signs involving the musculoskeletal system: Secondary | ICD-10-CM | POA: Diagnosis not present

## 2021-10-10 DIAGNOSIS — M2428 Disorder of ligament, vertebrae: Secondary | ICD-10-CM

## 2021-10-10 DIAGNOSIS — M545 Low back pain, unspecified: Secondary | ICD-10-CM

## 2021-10-10 DIAGNOSIS — M2540 Effusion, unspecified joint: Secondary | ICD-10-CM | POA: Insufficient documentation

## 2021-10-10 DIAGNOSIS — M5136 Other intervertebral disc degeneration, lumbar region: Secondary | ICD-10-CM

## 2021-10-10 DIAGNOSIS — M79605 Pain in left leg: Secondary | ICD-10-CM

## 2021-10-10 DIAGNOSIS — S32039S Unspecified fracture of third lumbar vertebra, sequela: Secondary | ICD-10-CM | POA: Insufficient documentation

## 2021-10-10 DIAGNOSIS — M4310 Spondylolisthesis, site unspecified: Secondary | ICD-10-CM

## 2021-10-10 DIAGNOSIS — S22089S Unspecified fracture of T11-T12 vertebra, sequela: Secondary | ICD-10-CM

## 2021-10-10 DIAGNOSIS — M47819 Spondylosis without myelopathy or radiculopathy, site unspecified: Secondary | ICD-10-CM

## 2021-10-10 DIAGNOSIS — M79604 Pain in right leg: Secondary | ICD-10-CM | POA: Diagnosis not present

## 2021-10-10 DIAGNOSIS — M47817 Spondylosis without myelopathy or radiculopathy, lumbosacral region: Secondary | ICD-10-CM

## 2021-10-10 DIAGNOSIS — G8929 Other chronic pain: Secondary | ICD-10-CM

## 2021-10-10 DIAGNOSIS — R937 Abnormal findings on diagnostic imaging of other parts of musculoskeletal system: Secondary | ICD-10-CM

## 2021-10-10 NOTE — Progress Notes (Signed)
Patient: Lisa Galvan  Service Category: E/M  Provider: Gaspar Cola, MD  DOB: 09/18/1960  DOS: 10/10/2021  Location: Office  MRN: [id number]  Setting: Ambulatory outpatient  Referring Provider: [name], DO  Type: Established Patient  Specialty: Interventional Pain Management  PCP: [name], DO  Location: Remote location  Delivery: TeleHealth     Virtual Encounter - Pain Management PROVIDER NOTE: Information contained herein reflects review and annotations entered in association with encounter. Interpretation of such information and data should be left to medically-trained personnel. Information provided to patient can be located elsewhere in the medical record under "Patient Instructions". Document created using STT-dictation technology, any transcriptional errors that may result from process are unintentional.    Contact & Pharmacy Preferred: [phone] Home: [phone] (home) Mobile: [phone] (mobile) E-mail: [email or web address]  [facility] [place], [place] AT [address] [place] [address] Phone: [phone] Fax: [phone]   Pre-screening  Ms. Kearse offered "in-person" vs "virtual" encounter. She indicated preferring virtual for this encounter.   Reason COVID-19*  Social distancing based on CDC and AMA recommendations.   I contacted [name] on 10/10/2021 via telephone.      I clearly identified myself as Gaspar Cola, MD. I verified that I was speaking with the correct person using two identifiers (Name: Dareen Gutzwiller, and date of birth: 1960/11/28).  Consent I sought verbal advanced consent from [name] for virtual visit interactions. I informed Ms. Manuele of possible security and privacy concerns, risks, and limitations associated with providing "not-in-person" medical evaluation and management services. I also informed Ms. Grindle of the availability of "in-person"  appointments. Finally, I informed her that there would be a charge for the virtual visit and that she could be  personally, fully or partially, financially responsible for it. Ms. Leece expressed understanding and agreed to proceed.   Historic Elements   Ms. Chyrl Elwell is a 61 y.o. year old, female patient evaluated today after our last contact on 09/26/2021. Ms. Ballowe  has a past medical history of Allergy, Anemia, Arthritis, Bilateral leg edema, Chest pain (12/15/2014), Chronic sciatica (12/10/2014), Essential hypertension (12/10/2014), Gastroesophageal reflux disease without esophagitis, Mixed hyperlipidemia, Obesity (BMI 30.0-34.9) (12/10/2014), Sleep apnea, Type 2 diabetes mellitus without complication ([place]) (18/29/9371), and Umbilical hernia. She also  has a past surgical history that includes Esophagogastroduodenoscopy (egd) with propofol (N/A, 03/12/2017); Umbilical hernia repair (N/A, 05/01/2017); Hernia repair; Esophagogastroduodenoscopy (egd) with propofol (N/A, 03/30/2019); Colonoscopy with propofol (N/A, 03/30/2019); ORIF ankle fracture (Left, 07/30/2019); XI robotic assisted ventral hernia (N/A, 10/06/2019); Incisional hernia repair (N/A, 11/29/2020); and Supracervical abdominal hysterectomy. Ms. Baranowski has a current medication list which includes the following prescription(s): accu-chek softclix lancets, aspirin ec, atorvastatin, vitamin d-3, duloxetine, duloxetine, epinephrine, fexofenadine, gabapentin, gabapentin, ibuprofen, ketoconazole, magnesium, metformin, montelukast, omeprazole, zenpep, potassium chloride, sucralfate, and vitamin b-12. She  reports that she has never smoked. She has never used smokeless tobacco. She reports current alcohol use of about 3.0 standard drinks of alcohol per week. She reports that she does not use drugs. Ms. Babiarz is allergic to benazepril, ivp dye [iodinated contrast media], lidocaine, lyrica [pregabalin], shellfish allergy, shellfish-derived products, trulicity  [dulaglutide], and ace inhibitors.   HPI  Today, she is being contacted for a post-procedure assessment. The patient indicates having attained 100% relief of the pain for the duration of the local anesthetic which after 6 to 8 hours began to wear off.  However, by the seventh day she had 100% relief of her low back and lower extremity pain.  Right now some of that has returned and she indicates that currently she is having 75% improvement in her lower back but she still having 100% improvement in her lower extremities.  She refers that currently she is not having any pain, but she still refers that occasionally she will have some pain in her toes.  Today I have offered to repeat the treatment and she indicated that she was interested in that, but she elected to do this after the holidays, meaning after 4 July.  I have instructed the patient to give [place] a call whenever she feels that she is ready to have it done.  She understood and accepted.  Post-procedure evaluation   Procedure 1: Type: Lumbar Trans-Foraminal Epidural Steroid Injection (TFESI) #1  Laterality: Bilateral (-50) Paravertebral Level: L5   Target: Foraminal peri-neural region, subarticular lateral recess area, and anterior intraspinal epidural space.  Procedure 2: Type: Lumbar Epidural Steroid Injection (LESI) (Inter-laminar) #1  Laterality: Left (-LT) Paravertebral Level: L4-5   Target: Posterior intraspinal epidural space Imaging: Fluoroscopic guidance Anesthesia: Local anesthesia (1-2% Lidocaine) Anxiolysis: None                 Sedation: None. DOS: 09/26/2021  Performed by: Gaspar Cola, MD  Purpose: Diagnostic/Therapeutic Indications: Low back/lower extremity pain severe enough to impact quality of life or function. Rationale (medical necessity): procedure needed and proper for the diagnosis and/or treatment of Ms. Rasnic's medical symptoms and needs. 1. Lumbosacral radiculopathy at L5 (Bilateral)   2. Chronic lower  extremity pain (2ry area of Pain) (Bilateral)   3. Numbness and tingling of both legs (L5 dermatome)   4. Lower extremity weakness (Bilateral)   5. Grade 1 Anterolisthesis of L4/5   6. DDD (degenerative disc disease), lumbar    History of Allergy to iodine    History of allergy to radiographic contrast media    History of allergy to shellfish    History of anaphylaxis    NAS-11 Pain score:   Pre-procedure: 5 /10   Post-procedure: 5 /10     Effectiveness:  Initial hour after procedure: 100 %. Subsequent 4-6 hours post-procedure: 100 %. Analgesia past initial 6 hours: 75 % (patient reports that it took about 7 days for the injection to work and legs are much improved.). Ongoing improvement:  Analgesic: The patient indicates having attained 100% relief of the pain for the duration of the local anesthetic which after 6 to 8 hours began to wear off.  However, by the seventh day she had 100% relief of her low back and lower extremity pain.  Right now some of that has returned and she indicates that currently she is having 75% improvement in her lower back but she still having 100% improvement in her lower extremities.  She refers that currently she is not having any pain, but she still refers that occasionally she will have some pain in her toes. Function: Ms. Gilchrest reports improvement in function ROM: Ms. Fischl reports improvement in ROM  Pharmacotherapy Assessment   Opioid Analgesic: No opioid analgesics prescribed by our practice.   Monitoring: [place] PMP: PDMP reviewed during this encounter.       Pharmacotherapy: No side-effects or adverse reactions reported. Compliance: No problems identified. Effectiveness: Clinically acceptable. Plan: Refer to "POC". UDS:  Summary  Date Value Ref Range Status  03/11/2019 Note  Final    Comment:    ====================================================================  Compliance Drug Analysis,  Ur ==================================================================== Test                             Result       Flag       Units Drug Present   Gabapentin                     PRESENT   Duloxetine                     PRESENT   Ibuprofen                      PRESENT ==================================================================== Test                      Result    Flag   Units      Ref Range   Creatinine              90               mg/dL      >=20 ==================================================================== Declared Medications:  Medication list was not provided. ==================================================================== For clinical consultation, please call [phone]. ====================================================================      Laboratory Chemistry Profile   Renal Lab Results  Component Value Date   BUN 8 06/06/2021   CREATININE 0.86 06/06/2021   BCR 9 (L) 06/06/2021   GFRAA 89 02/18/2020   GFRNONAA >60 01/31/2021    Hepatic Lab Results  Component Value Date   AST 16 06/06/2021   ALT 10 06/06/2021   ALBUMIN 4.8 06/06/2021   ALKPHOS 79 06/06/2021   AMYLASE 58 10/20/2020   LIPASE 10 (L) 10/20/2020    Electrolytes Lab Results  Component Value Date   NA 142 06/06/2021   K 4.0 06/06/2021   CL 100 06/06/2021   CALCIUM 9.9 06/06/2021   MG 1.5 (L) 03/11/2019   PHOS 4.7 (H) 07/03/2017    Bone Lab Results  Component Value Date   VD25OH 47.3 03/06/2021    Inflammation (CRP: Acute Phase) (ESR: Chronic Phase) Lab Results  Component Value Date   CRP <1 02/10/2019   ESRSEDRATE 27 03/11/2019         Note: Above Lab results reviewed.  Imaging  DG PAIN CLINIC C-ARM 1-60 MIN NO REPORT Fluoro was used, but no Radiologist interpretation will be provided.  Please refer to "NOTES" tab for provider progress note.  Assessment  The primary encounter diagnosis was Chronic low back pain (1ry area of Pain) (Bilateral) w/o  sciatica. Diagnoses of Chronic lower extremity pain (2ry area of Pain) (Bilateral), Lower extremity weakness (Bilateral), DDD (degenerative disc disease), lumbar, Grade 1 Anterolisthesis of L4/5, L3 superior endplate closed fracture, sequela, T12 superior endplate closed fracture, sequela, Lumbosacral facet arthropathy, Abnormal MRI, lumbar spine (02/08/2019 & 07/08/2021), Ligamentum flavum hypertrophy (L4-5), and Lumbar facet effusion/edema (L4-5, L5-S1) were also pertinent to this visit.  Plan of Care  Problem-specific:  No problem-specific Assessment & Plan notes found for this encounter.  Ms. Therisa Mennella has a current medication list which includes the following long-term medication(s): atorvastatin, duloxetine, duloxetine, fexofenadine, gabapentin, gabapentin, metformin, montelukast, omeprazole, potassium chloride, and sucralfate.  Pharmacotherapy (Medications Ordered): No orders of the defined types were placed in this encounter.  Orders:  No orders of the defined types were placed in this encounter.  Follow-up plan:   No follow-ups on file.  Interventional Therapies  Risk  Complexity Considerations:   Estimated body mass index is 26.57 kg/m as calculated from the following:   Height as of 01/31/21: '5\' 3"'  (1.6 m).   Weight as of 01/31/21: 150 lb (68 kg). WNL   Planned  Pending:      Under consideration:   Therapeutic left L4-5 LESI #2 + bilateral L5 TFESI #2  Therapeutic caudal ESI #3  Therapeutic [place] treatment of her diabetic peripheral neuropathy.   Completed:   Diagnostic/therapeutic right CESI x1 (08/03/2021)  Palliative right lumbar facet RFA x1 (06/02/2019) (100/100/95)  Palliative left lumbar facet RFA x1 (06/30/2019) (100/100/95)  Palliative bilateral lumbar facet MBB x2 (04/21/2019) (100/100/40/40)  Palliative right sacroiliac joint Blk x1 (04/21/2019) (100/100/60/40)  Diagnostic midline to right caudal ESI x2 (07/04/2021) (75/75/75/75)   Diagnostic/therapeutic bilateral L5 TFESI x1 (09/26/2021) (100/100/75/LBP:75/LEP:100)  Diagnostic/therapeutic left L4-5 LESI x1 (09/26/2021) (100/100/75/LBP:75/LEP:100)    Completed by other providers:   Diagnostic/therapeutic right L4-5 LESI x1 (done by Dr. Primus Bravo on 12/29/2014)   Therapeutic  Palliative (PRN) options:   Therapeutic left L4-5 LESI #2 + bilateral L5 TFESI #2  Palliative lumbar facet RFA  Palliative lumbar facet MBB  Palliative sacroiliac joint Blk  Diagnostic caudal ESI     Recent Visits Date Type Provider Dept  09/26/21 Procedure visit Milinda Pointer, MD [facility]  08/22/21 Office Visit Milinda Pointer, MD [facility]  08/03/21 Procedure visit Milinda Pointer, MD [facility]  07/25/21 Office Visit Milinda Pointer, MD [facility]  Showing recent visits within past 90 days and meeting all other requirements Today's Visits Date Type Provider Dept  10/10/21 Office Visit Milinda Pointer, MD [facility]  Showing today's visits and meeting all other requirements Future Appointments No visits were found meeting these conditions. Showing future appointments within next 90 days and meeting all other requirements  I discussed the assessment and treatment plan with the patient. The patient was provided an opportunity to ask questions and all were answered. The patient agreed with the plan and demonstrated an understanding of the instructions.  Patient advised to call back or seek an in-person evaluation if the symptoms or condition worsens.  Duration of encounter: 15 minutes.  Note by: Gaspar Cola, MD Date: 10/10/2021; Time: 3:58 PM

## 2021-10-17 ENCOUNTER — Ambulatory Visit
Admission: RE | Admit: 2021-10-17 | Discharge: 2021-10-17 | Disposition: A | Discharge status: Home / Self Care | Payer: BC Managed Care – PPO | Source: Ambulatory Visit | Attending: Family Medicine | Admitting: Family Medicine

## 2021-10-17 DIAGNOSIS — Z1231 Encounter for screening mammogram for malignant neoplasm of breast: Secondary | ICD-10-CM | POA: Diagnosis not present

## 2021-10-27 ENCOUNTER — Ambulatory Visit: Payer: Self-pay

## 2021-10-27 DIAGNOSIS — Z9181 History of falling: Secondary | ICD-10-CM

## 2021-10-27 DIAGNOSIS — I10 Essential (primary) hypertension: Secondary | ICD-10-CM

## 2021-10-27 DIAGNOSIS — G894 Chronic pain syndrome: Secondary | ICD-10-CM

## 2021-10-27 DIAGNOSIS — E1140 Type 2 diabetes mellitus with diabetic neuropathy, unspecified: Secondary | ICD-10-CM

## 2021-10-27 DIAGNOSIS — E785 Hyperlipidemia, unspecified: Secondary | ICD-10-CM

## 2021-10-27 DIAGNOSIS — R269 Unspecified abnormalities of gait and mobility: Secondary | ICD-10-CM

## 2021-10-27 DIAGNOSIS — G8929 Other chronic pain: Secondary | ICD-10-CM

## 2021-10-27 NOTE — Chronic Care Management (AMB) (Signed)
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 diet: heart healthy/ADA diet - develop an exercise routine       Plan: No further follow up required: the patient has met the goals of care and the care plan has been closed. The patient knows to call for changes or needs.  [name] RN, MSN, [facility] Mobile: [phone]
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
## 2021-10-29 NOTE — Progress Notes (Unsigned)
PROVIDER NOTE: Interpretation of information contained herein should be left to medically-trained personnel. Specific patient instructions are provided elsewhere under "Patient Instructions" section of medical record. This document was created in part using STT-dictation technology, any transcriptional errors that may result from this process are unintentional.  Patient: Lisa Galvan Type: Established DOB: 04/27/1960 MRN: [id number] PCP: Dorcas Carrow, DO  Service: Procedure DOS: 10/31/2021 Setting: Ambulatory Location: Ambulatory outpatient facility Delivery: Face-to-face Provider: Oswaldo Done, MD Specialty: Interventional Pain Management Specialty designation: 09 Location: Outpatient facility Ref. Prov.: Dorcas Carrow, DO    Primary Reason for Visit: Interventional Pain Management Treatment. CC: No chief complaint on file.   Procedure(s):     Procedure 1: Type: Lumbar Trans-Foraminal Epidural Steroid Injection (TFESI) #2  Laterality: Bilateral (-50) Paravertebral Level: L5   Target: Foraminal peri-neural region, subarticular lateral recess area, and anterior intraspinal epidural space.  Procedure 2: Type: Lumbar Epidural Steroid Injection (LESI) (Inter-laminar) #2  Laterality: Left (-LT) Paravertebral Level: L4-5   Target: Posterior intraspinal epidural space Imaging: Fluoroscopic guidance Anesthesia: Local anesthesia (1-2% Lidocaine) Anxiolysis: None                 Sedation: None. DOS: 10/31/2021  Performed by: Oswaldo Done, MD  Purpose: Diagnostic/Therapeutic Indications: Low back/lower extremity pain severe enough to impact quality of life or function. Rationale (medical necessity): procedure needed and proper for the diagnosis and/or treatment of Lisa Galvan's medical symptoms and needs. No diagnosis found. NAS-11 Pain score:   Pre-procedure:  /10   Post-procedure:  /10      Region: Lumbosacral Approach: Percutaneous  Type of procedure: Perineural  injection   Position / Prep / Materials:  Position: Prone with head of the table was raised to facilitate breathing.  Prep solution: DuraPrep (Iodine Povacrylex [0.7% available iodine] and Isopropyl Alcohol, 74% w/w) Prep Area: Entire posterior thoracolumbar & lumbosacral region  Procedure 1 Materials:  Tray: Block Needle(s):  Type: Epidural & Spinal  Gauge (G): 22  Length: 5-in  Qty:  ***  Materials:  Tray: Epidural tray Needle(s):  Type: Epidural needle          Gauge (G):  17 Length: Regular (3.5-in) Qty: 1     Pre-op H&P Assessment:  Lisa Galvan is a 61 y.o. (year old), female patient, seen today for interventional treatment. She  has a past surgical history that includes Esophagogastroduodenoscopy (egd) with propofol (N/A, 03/12/2017); Umbilical hernia repair (N/A, 05/01/2017); Hernia repair; Esophagogastroduodenoscopy (egd) with propofol (N/A, 03/30/2019); Colonoscopy with propofol (N/A, 03/30/2019); ORIF ankle fracture (Left, 07/30/2019); XI robotic assisted ventral hernia (N/A, 10/06/2019); Incisional hernia repair (N/A, 11/29/2020); and Supracervical abdominal hysterectomy. Lisa Galvan has a current medication list which includes the following prescription(s): accu-chek softclix lancets, aspirin ec, atorvastatin, vitamin d-3, duloxetine, duloxetine, epinephrine, fexofenadine, gabapentin, gabapentin, ibuprofen, ketoconazole, magnesium, metformin, montelukast, omeprazole, zenpep, potassium chloride, sucralfate, and vitamin b-12. Her primarily concern today is the No chief complaint on file.  Initial Vital Signs:  Pulse/HCG Rate:    Temp:   Resp:   BP:   SpO2:    BMI: Estimated body mass index is 31.83 kg/m as calculated from the following:   Height as of 09/26/21: 5\' 2"  (1.575 m).   Weight as of 09/26/21: 174 lb (78.9 kg).  Risk Assessment: Allergies: Reviewed. She is allergic to benazepril, ivp dye [iodinated contrast media], lidocaine, lyrica [pregabalin], shellfish allergy,  shellfish-derived products, trulicity [dulaglutide], and ace inhibitors.  Allergy Precautions: None required Coagulopathies: Reviewed. None identified.  Blood-thinner therapy: None  at this time Active Infection(s): Reviewed. None identified. Lisa Galvan is afebrile  Site Confirmation: Lisa Galvan was asked to confirm the procedure and laterality before marking the site Procedure checklist: Completed Consent: Before the procedure and under the influence of no sedative(s), amnesic(s), or anxiolytics, the patient was informed of the treatment options, risks and possible complications. To fulfill our ethical and legal obligations, as recommended by the American Medical Association's Code of Ethics, I have informed the patient of my clinical impression; the nature and purpose of the treatment or procedure; the risks, benefits, and possible complications of the intervention; the alternatives, including doing nothing; the risk(s) and benefit(s) of the alternative treatment(s) or procedure(s); and the risk(s) and benefit(s) of doing nothing. The patient was provided information about the general risks and possible complications associated with the procedure. These may include, but are not limited to: failure to achieve desired goals, infection, bleeding, organ or nerve damage, allergic reactions, paralysis, and death. In addition, the patient was informed of those risks and complications associated to Spine-related procedures, such as failure to decrease pain; infection (i.e.: Meningitis, epidural or intraspinal abscess); bleeding (i.e.: epidural hematoma, subarachnoid hemorrhage, or any other type of intraspinal or peri-dural bleeding); organ or nerve damage (i.e.: Any type of peripheral nerve, nerve root, or spinal cord injury) with subsequent damage to sensory, motor, and/or autonomic systems, resulting in permanent pain, numbness, and/or weakness of one or several areas of the body; allergic reactions; (i.e.:  anaphylactic reaction); and/or death. Furthermore, the patient was informed of those risks and complications associated with the medications. These include, but are not limited to: allergic reactions (i.e.: anaphylactic or anaphylactoid reaction(s)); adrenal axis suppression; blood sugar elevation that in diabetics may result in ketoacidosis or comma; water retention that in patients with history of congestive heart failure may result in shortness of breath, pulmonary edema, and decompensation with resultant heart failure; weight gain; swelling or edema; medication-induced neural toxicity; particulate matter embolism and blood vessel occlusion with resultant organ, and/or nervous system infarction; and/or aseptic necrosis of one or more joints. Finally, the patient was informed that Medicine is not an exact science; therefore, there is also the possibility of unforeseen or unpredictable risks and/or possible complications that may result in a catastrophic outcome. The patient indicated having understood very clearly. We have given the patient no guarantees and we have made no promises. Enough time was given to the patient to ask questions, all of which were answered to the patient's satisfaction. Ms. Samonte has indicated that she wanted to continue with the procedure. Attestation: I, the ordering provider, attest that I have discussed with the patient the benefits, risks, side-effects, alternatives, likelihood of achieving goals, and potential problems during recovery for the procedure that I have provided informed consent. Date  Time: {CHL [facility]-PAIN TIME CHOICES:[id number]}  Pre-Procedure Preparation:  Monitoring: As per clinic protocol. Respiration, ETCO2, SpO2, BP, heart rate and rhythm monitor placed and checked for adequate function Safety Precautions: Patient was assessed for positional comfort and pressure points before starting the procedure. Time-out: I initiated and conducted the "Time-out" before  starting the procedure, as per protocol. The patient was asked to participate by confirming the accuracy of the "Time Out" information. Verification of the correct person, site, and procedure were performed and confirmed by me, the nursing staff, and the patient. "Time-out" conducted as per Joint Commission's Universal Protocol (UP.01.01.01). Time:    Description/Narrative of Procedure:          Rationale (medical necessity): procedure needed  and proper for the diagnosis and/or treatment of the patient's medical symptoms and needs. Procedural Technique Safety Precautions: Aspiration looking for blood return was conducted prior to all injections. At no point did we inject any substances, as a needle was being advanced. No attempts were made at seeking any paresthesias. Safe injection practices and needle disposal techniques used. Medications properly checked for expiration dates. SDV (single dose vial) medications used. Description of the Procedure: Protocol guidelines were followed. The patient was assisted into a comfortable position. The target area was identified and the area prepped in the usual manner. Skin & deeper tissues infiltrated with local anesthetic. Appropriate amount of time allowed to pass for local anesthetics to take effect. The procedure needles were then advanced to the target area. Proper needle placement secured. Negative aspiration confirmed. Solution injected in intermittent fashion, asking for systemic symptoms every 0.5cc of injectate. The needles were then removed and the area cleansed, making sure to leave some of the prepping solution back to take advantage of its long term bactericidal properties.  Technical description of procedure:  Description of Procedure #1:  Target Area: The inferior and lateral portion of the pedicle, just lateral to a line created by the 6:00 position of the pedicle and the superior articular process of the vertebral body below. On the lateral view,  this target lies just posterior to the anterior aspect of the lamina and posterior to the midpoint created between the anterior and the posterior aspect of the neural foramina.  Safety Precautions: Aspiration looking for blood return was conducted prior to all injections. At no point did we inject any substances, as a needle was being advanced. No attempts were made at seeking any paresthesias. Safe injection practices and needle disposal techniques used. Medications properly checked for expiration dates. SDV (single dose vial) medications used.  Description of the Procedure: Protocol guidelines were followed. The patient was placed in position over the fluoroscopy table. The target area was identified and the area prepped in the usual manner. Skin & deeper tissues infiltrated with local anesthetic. Appropriate amount of time allowed to pass for local anesthetics to take effect. The procedure needles were then advanced to the target area. Proper needle placement secured. Negative aspiration confirmed. Solution injected in intermittent fashion, asking for systemic symptoms every 0.2cc of injectate. The needles were then removed and the area cleansed, making sure to leave some of the prepping solution back to take advantage of its long term bactericidal properties.  Start Time:   hrs.  Description of Procedure #2:  Target Area: The  interlaminar space, initially targeting the lower border of the superior vertebral body lamina.  Description of the Procedure: Protocol guidelines were followed. The patient was placed in position over the fluoroscopy table. The target area was identified and the area prepped in the usual manner. Skin & deeper tissues infiltrated with local anesthetic. Appropriate amount of time allowed to pass for local anesthetics to take effect. The procedure needle was introduced through the skin, ipsilateral to the reported pain, and advanced to the target area. Bone was contacted and the  needle walked caudad, until the lamina was cleared. The ligamentum flavum was engaged and loss-of-resistance technique used as the epidural needle was advanced. The epidural space was identified using "loss-of-resistance technique" with 2-3 ml of PF-NaCl (0.9% NSS), in a 5cc LOR glass syringe. Proper needle placement secured. Negative aspiration confirmed. Solution injected in intermittent fashion, asking for systemic symptoms every 0.5cc of injectate. The needles were then removed  and the area cleansed, making sure to leave some of the prepping solution back to take advantage of its long term bactericidal properties.  There were no vitals filed for this visit.   End Time:   hrs.  Imaging Guidance (Spinal):          Type of Imaging Technique: Fluoroscopy Guidance (Spinal) Indication(s): Assistance in needle guidance and placement for procedures requiring needle placement in or near specific anatomical locations not easily accessible without such assistance. Exposure Time: Please see nurses notes. Contrast: Before injecting any contrast, we confirmed that the patient did not have an allergy to iodine, shellfish, or radiological contrast. Once satisfactory needle placement was completed at the desired level, radiological contrast was injected. Contrast injected under live fluoroscopy. No contrast complications. See chart for type and volume of contrast used. Fluoroscopic Guidance: I was personally present during the use of fluoroscopy. "Tunnel Vision Technique" used to obtain the best possible view of the target area. Parallax error corrected before commencing the procedure. "Direction-depth-direction" technique used to introduce the needle under continuous pulsed fluoroscopy. Once target was reached, antero-posterior, oblique, and lateral fluoroscopic projection used confirm needle placement in all planes. Images permanently stored in EMR. Interpretation: I personally interpreted the imaging  intraoperatively. Adequate needle placement confirmed in multiple planes. Appropriate spread of contrast into desired area was observed. No evidence of afferent or efferent intravascular uptake. No intrathecal or subarachnoid spread observed. Permanent images saved into the patient's record.  Post-operative Assessment:  Post-procedure Vital Signs:  Pulse/HCG Rate:    Temp:   Resp:   BP:   SpO2:    EBL: None  Complications: No immediate post-treatment complications observed by team, or reported by patient.  Note: The patient tolerated the entire procedure well. A repeat set of vitals were taken after the procedure and the patient was kept under observation following institutional policy, for this type of procedure. Post-procedural neurological assessment was performed, showing return to baseline, prior to discharge. The patient was provided with post-procedure discharge instructions, including a section on how to identify potential problems. Should any problems arise concerning this procedure, the patient was given instructions to immediately contact us, at any time, without hesitation. In any case, we plan to contact the patient by telephone for a follow-up status report regarding this interventional procedure.  Comments:  No additional relevant information.  Plan of Care  Orders:  No orders of the defined types were placed in this encounter.  Chronic Opioid Analgesic:  No opioid analgesics prescribed by our practice.   Medications ordered for procedure: No orders of the defined types were placed in this encounter.  Medications administered: Dillan Lunden had no medications administered during this visit.  See the medical record for exact dosing, route, and time of administration.  Follow-up plan:   No follow-ups on file.       Interventional Therapies  Risk  Complexity Considerations:   Estimated body mass index is 26.57 kg/m as calculated from the following:   Height as of  01/31/21: 5\' 3"  (1.6 m).   Weight as of 01/31/21: 150 lb (68 kg). WNL   Planned  Pending:      Under consideration:   Therapeutic left L4-5 LESI #2 + bilateral L5 TFESI #2  Therapeutic caudal ESI #3  Therapeutic Qutenza treatment of her diabetic peripheral neuropathy.   Completed:   Diagnostic/therapeutic right CESI x1 (08/03/2021)  Palliative right lumbar facet RFA x1 (06/02/2019) (100/100/95)  Palliative left lumbar facet RFA x1 (06/30/2019) (100/100/95)  Palliative bilateral lumbar facet MBB x2 (04/21/2019) (100/100/40/40)  Palliative right sacroiliac joint Blk x1 (04/21/2019) (100/100/60/40)  Diagnostic midline to right caudal ESI x2 (07/04/2021) (75/75/75/75)  Diagnostic/therapeutic bilateral L5 TFESI x1 (09/26/2021) (100/100/75/LBP:75/LEP:100)  Diagnostic/therapeutic left L4-5 LESI x1 (09/26/2021) (100/100/75/LBP:75/LEP:100)    Completed by other providers:   Diagnostic/therapeutic right L4-5 LESI x1 (done by Dr. Metta Clinesrisp on 12/29/2014)   Therapeutic  Palliative (PRN) options:   Therapeutic left L4-5 LESI #2 + bilateral L5 TFESI #2  Palliative lumbar facet RFA  Palliative lumbar facet MBB  Palliative sacroiliac joint Blk  Diagnostic caudal ESI      Recent Visits Date Type Provider Dept  10/10/21 Office Visit Delano MetzNaveira, Kash Mothershead, MD [facility]  09/26/21 Procedure visit Delano MetzNaveira, Pia Jedlicka, MD [facility]  08/22/21 Office Visit Delano MetzNaveira, Breon Rehm, MD [facility]  08/03/21 Procedure visit Delano MetzNaveira, Teosha Casso, MD [facility]  Showing recent visits within past 90 days and meeting all other requirements Future Appointments Date Type Provider Dept  10/31/21 Appointment Delano MetzNaveira, Lional Icenogle, MD [facility]  Showing future appointments within next 90 days and meeting all other requirements  Disposition: Discharge home  Discharge (Date  Time): 10/31/2021;   hrs.   Primary Care Physician: Dorcas CarrowJohnson, Megan P, DO [facility] Note by: Oswaldo DoneFrancisco A Treyten Monestime, MD Date: 10/31/2021; Time: 3:31 PM  Disclaimer:  Medicine is not an Visual merchandiserexact science. The only guarantee in medicine is that nothing is guaranteed. It is important to note that the decision to proceed with this intervention was based on the information collected from the patient. The Data and conclusions were drawn from the patient's questionnaire, the interview, and the physical examination. Because the information was provided in large part by the patient, it cannot be guaranteed that it has not been purposely or unconsciously manipulated. Every effort has been made to obtain as much relevant data as possible for this evaluation. It is important to note that the conclusions that lead to this procedure are derived in large part from the available data. Always take into account that the treatment will also be dependent on availability of resources and existing treatment guidelines, considered by other Pain Management Practitioners as being common knowledge and practice, at the time of the intervention. For Medico-Legal purposes, it is also important to point out that variation in procedural techniques and pharmacological choices are the acceptable norm. The indications, contraindications, technique, and results of the above procedure should only be interpreted and judged by a Board-Certified Interventional Pain Specialist with extensive familiarity and expertise in the same exact procedure and technique.

## 2021-10-31 ENCOUNTER — Ambulatory Visit
Admission: RE | Admit: 2021-10-31 | Discharge: 2021-10-31 | Disposition: A | Discharge status: Home / Self Care | Payer: BC Managed Care – PPO | Source: Ambulatory Visit | Attending: Pain Medicine | Admitting: Pain Medicine

## 2021-10-31 ENCOUNTER — Ambulatory Visit: Payer: BC Managed Care – PPO | Attending: Pain Medicine | Admitting: Pain Medicine

## 2021-10-31 ENCOUNTER — Other Ambulatory Visit: Payer: Self-pay

## 2021-10-31 ENCOUNTER — Encounter: Payer: Self-pay | Admitting: Pain Medicine

## 2021-10-31 VITALS — BP 135/94 | HR 107 | Temp 97.2°F | Resp 18 | Ht 62.0 in | Wt 174.0 lb

## 2021-10-31 DIAGNOSIS — M545 Low back pain, unspecified: Secondary | ICD-10-CM | POA: Insufficient documentation

## 2021-10-31 DIAGNOSIS — G8929 Other chronic pain: Secondary | ICD-10-CM | POA: Diagnosis not present

## 2021-10-31 DIAGNOSIS — M4310 Spondylolisthesis, site unspecified: Secondary | ICD-10-CM | POA: Diagnosis not present

## 2021-10-31 DIAGNOSIS — Z91041 Radiographic dye allergy status: Secondary | ICD-10-CM | POA: Diagnosis present

## 2021-10-31 DIAGNOSIS — Z91013 Allergy to seafood: Secondary | ICD-10-CM | POA: Diagnosis present

## 2021-10-31 DIAGNOSIS — Z888 Allergy status to other drugs, medicaments and biological substances status: Secondary | ICD-10-CM | POA: Insufficient documentation

## 2021-10-31 DIAGNOSIS — M5417 Radiculopathy, lumbosacral region: Secondary | ICD-10-CM | POA: Insufficient documentation

## 2021-10-31 DIAGNOSIS — R29898 Other symptoms and signs involving the musculoskeletal system: Secondary | ICD-10-CM | POA: Insufficient documentation

## 2021-10-31 DIAGNOSIS — M2428 Disorder of ligament, vertebrae: Secondary | ICD-10-CM | POA: Diagnosis not present

## 2021-10-31 DIAGNOSIS — S22089S Unspecified fracture of T11-T12 vertebra, sequela: Secondary | ICD-10-CM | POA: Diagnosis not present

## 2021-10-31 DIAGNOSIS — M79605 Pain in left leg: Secondary | ICD-10-CM | POA: Diagnosis not present

## 2021-10-31 DIAGNOSIS — M79604 Pain in right leg: Secondary | ICD-10-CM | POA: Diagnosis not present

## 2021-10-31 DIAGNOSIS — S32030S Wedge compression fracture of third lumbar vertebra, sequela: Secondary | ICD-10-CM | POA: Insufficient documentation

## 2021-10-31 DIAGNOSIS — R200 Anesthesia of skin: Secondary | ICD-10-CM | POA: Insufficient documentation

## 2021-10-31 DIAGNOSIS — S32039S Unspecified fracture of third lumbar vertebra, sequela: Secondary | ICD-10-CM | POA: Insufficient documentation

## 2021-10-31 DIAGNOSIS — Z87892 Personal history of anaphylaxis: Secondary | ICD-10-CM | POA: Insufficient documentation

## 2021-10-31 DIAGNOSIS — R937 Abnormal findings on diagnostic imaging of other parts of musculoskeletal system: Secondary | ICD-10-CM | POA: Insufficient documentation

## 2021-10-31 DIAGNOSIS — R202 Paresthesia of skin: Secondary | ICD-10-CM | POA: Insufficient documentation

## 2021-10-31 DIAGNOSIS — M5136 Other intervertebral disc degeneration, lumbar region: Secondary | ICD-10-CM | POA: Insufficient documentation

## 2021-10-31 MED ORDER — ROPIVACAINE HCL 2 MG/ML IJ SOLN
2.0000 mL | Freq: Once | INTRAMUSCULAR | Status: AC
  Administered 2021-10-31: 2 mL via EPIDURAL

## 2021-10-31 MED ORDER — SODIUM CHLORIDE 0.9% FLUSH
2.0000 mL | Freq: Once | INTRAVENOUS | Status: AC
  Administered 2021-10-31: 2 mL

## 2021-10-31 MED ORDER — PENTAFLUOROPROP-TETRAFLUOROETH EX AERO
INHALATION_SPRAY | Freq: Once | CUTANEOUS | Status: AC
  Administered 2021-10-31: 30 via TOPICAL

## 2021-10-31 MED ORDER — LACTATED RINGERS IV SOLN: Freq: Once | INTRAVENOUS | Status: DC

## 2021-10-31 MED ORDER — ROPIVACAINE HCL 2 MG/ML IJ SOLN
2.0000 mL | Freq: Once | INTRAMUSCULAR | Status: AC
  Administered 2021-10-31: 2 mL via EPIDURAL
  Filled 2021-10-31: qty 20

## 2021-10-31 MED ORDER — MIDAZOLAM HCL 5 MG/5ML IJ SOLN: 0.5000 mg | Freq: Once | INTRAMUSCULAR | Status: DC

## 2021-10-31 MED ORDER — LIDOCAINE HCL 2 % IJ SOLN
20.0000 mL | Freq: Once | INTRAMUSCULAR | Status: AC
  Administered 2021-10-31: 400 mg
  Filled 2021-10-31: qty 40

## 2021-10-31 MED ORDER — TRIAMCINOLONE ACETONIDE 40 MG/ML IJ SUSP
40.0000 mg | Freq: Once | INTRAMUSCULAR | Status: AC
  Administered 2021-10-31: 40 mg
  Filled 2021-10-31: qty 1

## 2021-10-31 MED ORDER — DEXAMETHASONE SODIUM PHOSPHATE 10 MG/ML IJ SOLN
20.0000 mg | Freq: Once | INTRAMUSCULAR | Status: AC
  Administered 2021-10-31: 20 mg
  Filled 2021-10-31: qty 2

## 2021-10-31 NOTE — Patient Instructions (Addendum)
____________________________________________________________________________________________  Virtual Visits   What is a "Virtual Visit"? It is a Metallurgist (medical visit) that takes place on real time (NOT TEXT or E-MAIL) over the telephone or computer device (desktop, laptop, tablet, smart phone, etc.). It allows for more location flexibility between the patient and the healthcare provider.  Who decides when these types of visits will be used? The physician.  Who is eligible for these types of visits? Only those patients that can be reliably reached over the telephone.  What do you mean by reliably? We do not have time to call everyone multiple times, therefore those that tend to screen calls and then call back later are not suitable candidates for this system. We understand how people are reluctant to pickup on "unknown" calls, therefore, we suggest adding our telephone numbers to your list of "CONTACT(s)". This way, you should be able to readily identify our calls when you receive one. All of our numbers are available below.   Who is not eligible? This option is not available for medication management encounters, specially for controlled substances. Patients on pain medications that fall under the category of controlled substances have to come in for "Face-to-Face" encounters. This is required for mandatory monitoring of these substances. You may be asked to provide a sample for an unannounced urine drug screening test (UDS), and we will need to count your pain pills. Not bringing your pills to be counted may result in no refill. Obviously, neither one of these can be done over the phone.  When will this type of visits be used? You can request a virtual visit whenever you are physically unable to attend a regular appointment. The decision will be made by the physician (or healthcare provider) on a case by case basis.   At what time will I be called? This is an  excellent question. The providers will try to call you whenever they have time available. Do not expect to be called at any specific time. The secretaries will assign you a time for your virtual visit appointment, but this is done simply to keep a list of those patients that need to be called, but not for the purpose of keeping a time schedule. Be advised that the call may come in anytime during the day, between the hours of 8:00 AM and 8::00 PM, depending on provider availability. We do understand that the system is not perfect. If you are unable to be available that day on a moments notice, then request an "in-person" appointment rather than a "virtual visit".  Can I request my medication visits to be "Virtual"? Yes you may request it, but the decision is entirely up to the healthcare provider. Control substances require specific monitoring that requires Face-to-Face encounters. The number of encounters  and the extent of the monitoring is determined on a case by case basis.  Add a new contact to your smart phone and label it "PAIN CLINIC" Under this contact add the following numbers: Main: [phone] (Official Contact Number) Nurses: [phone] (These are outgoing only calling systems. Do not call this number.) Dr. Dossie Arbour: [phone] or [phone] (Outgoing calls only. Do not call this number.)  ____________________________________________________________________________________________  ____________________________________________________________________________________________  Post-Procedure Discharge Instructions  Instructions: Apply ice:  Purpose: This will minimize any swelling and discomfort after procedure.  When: Day of procedure, as soon as you get home. How: Fill a plastic sandwich bag with crushed ice. Cover it with a small towel and apply to injection  site. How long: (15 min on, 15 min off) Apply for 15 minutes then remove x 15 minutes.  Repeat sequence on day of  procedure, until you go to bed. Apply heat:  Purpose: To treat any soreness and discomfort from the procedure. When: Starting the next day after the procedure. How: Apply heat to procedure site starting the day following the procedure. How long: May continue to repeat daily, until discomfort goes away. Food intake: Start with clear liquids (like water) and advance to regular food, as tolerated.  Physical activities: Keep activities to a minimum for the first 8 hours after the procedure. After that, then as tolerated. Driving: If you have received any sedation, be responsible and do not drive. You are not allowed to drive for 24 hours after having sedation. Blood thinner: (Applies only to those taking blood thinners) You may restart your blood thinner 6 hours after your procedure. Insulin: (Applies only to Diabetic patients taking insulin) As soon as you can eat, you may resume your normal dosing schedule. Infection prevention: Keep procedure site clean and dry. Shower daily and clean area with soap and water. Post-procedure Pain Diary: Extremely important that this be done correctly and accurately. Recorded information will be used to determine the next step in treatment. For the purpose of accuracy, follow these rules: Evaluate only the area treated. Do not report or include pain from an untreated area. For the purpose of this evaluation, ignore all other areas of pain, except for the treated area. After your procedure, avoid taking a long nap and attempting to complete the pain diary after you wake up. Instead, set your alarm clock to go off every hour, on the hour, for the initial 8 hours after the procedure. Document the duration of the numbing medicine, and the relief you are getting from it. Do not go to sleep and attempt to complete it later. It will not be accurate. If you received sedation, it is likely that you were given a medication that may cause amnesia. Because of this, completing the  diary at a later time may cause the information to be inaccurate. This information is needed to plan your care. Follow-up appointment: Keep your post-procedure follow-up evaluation appointment after the procedure (usually 2 weeks for most procedures, 6 weeks for radiofrequencies). DO NOT FORGET to bring you pain diary with you.   Expect: (What should I expect to see with my procedure?) From numbing medicine (AKA: Local Anesthetics): Numbness or decrease in pain. You may also experience some weakness, which if present, could last for the duration of the local anesthetic. Onset: Full effect within 15 minutes of injected. Duration: It will depend on the type of local anesthetic used. On the average, 1 to 8 hours.  From steroids (Applies only if steroids were used): Decrease in swelling or inflammation. Once inflammation is improved, relief of the pain will follow. Onset of benefits: Depends on the amount of swelling present. The more swelling, the longer it will take for the benefits to be seen. In some cases, up to 10 days. Duration: Steroids will stay in the system x 2 weeks. Duration of benefits will depend on multiple posibilities including persistent irritating factors. Side-effects: If present, they may typically last 2 weeks (the duration of the steroids). Frequent: Cramps (if they occur, drink Gatorade and take over-the-counter Magnesium 450-500 mg once to twice a day); water retention with temporary weight gain; increases in blood sugar; decreased immune system response; increased appetite. Occasional: Facial flushing (red, warm  cheeks); mood swings; menstrual changes. Uncommon: Long-term decrease or suppression of natural hormones; bone thinning. (These are more common with higher doses or more frequent use. This is why we prefer that our patients avoid having any injection therapies in other practices.)  Very Rare: Severe mood changes; psychosis; aseptic necrosis. From procedure: Some  discomfort is to be expected once the numbing medicine wears off. This should be minimal if ice and heat are applied as instructed.  Call if: (When should I call?) You experience numbness and weakness that gets worse with time, as opposed to wearing off. New onset bowel or bladder incontinence. (Applies only to procedures done in the spine)  Emergency Numbers: Durning business hours (Monday - Thursday, 8:00 AM - 4:00 PM) (Friday, 9:00 AM - 12:00 Noon): [phone] After hours: [phone] NOTE: If you are having a problem and are unable connect with, or to talk to a provider, then go to your nearest urgent care or emergency department. If the problem is serious and urgent, please call 911. ____________________________________________________________________________________________  Pain Management Discharge Instructions  General Discharge Instructions :  If you need to reach your doctor call: Monday-Friday 8:00 am - 4:00 pm at [phone] or toll free [phone].  After clinic hours [phone] to have operator reach doctor.  Bring all of your medication bottles to all your appointments in the pain clinic.  To cancel or reschedule your appointment with Pain Management please remember to call 24 hours in advance to avoid a fee.  Refer to the educational materials which you have been given on: General Risks, I had my Procedure. Discharge Instructions, Post Sedation.  Post Procedure Instructions:  The drugs you were given will stay in your system until tomorrow, so for the next 24 hours you should not drive, make any legal decisions or drink any alcoholic beverages.  You may eat anything you prefer, but it is better to start with liquids then soups and crackers, and gradually work up to solid foods.  Please notify your doctor immediately if you have any unusual bleeding, trouble breathing or pain that is not related to your normal pain.  Depending on the type of procedure that  was done, some parts of your body may feel week and/or numb.  This usually clears up by tonight or the next day.  Walk with the use of an assistive device or accompanied by an adult for the 24 hours.  You may use ice on the affected area for the first 24 hours.  Put ice in a Ziploc bag and cover with a towel and place against area 15 minutes on 15 minutes off.  You may switch to heat after 24 hours.Selective Nerve Root Block Patient Information  Description: Specific nerve roots exit the spinal canal and these nerves can be compressed and inflamed by a bulging disc and bone spurs.  By injecting steroids on the nerve root, we can potentially decrease the inflammation surrounding these nerves, which often leads to decreased pain.  Also, by injecting local anesthesia on the nerve root, this can provide [place] helpful information to give to your referring doctor if it decreases your pain.  Selective nerve root blocks can be done along the spine from the neck to the low back depending on the location of your pain.   After numbing the skin with local anesthesia, a small needle is passed to the nerve root and the position of the needle is verified using x-ray pictures.  After the needle is in correct  position, we then deposit the medication.  You may experience a pressure sensation while this is being done.  The entire block usually lasts less than 15 minutes.  Conditions that may be treated with selective nerve root blocks: Low back and leg pain Spinal stenosis Diagnostic block prior to potential surgery Neck and arm pain Post laminectomy syndrome  Preparation for the injection:  Do not eat any solid food or dairy products within 8 hours of your appointment. You may drink clear liquids up to 3 hours before an appointment.  Clear liquids include water, black coffee, juice or soda.  No milk or cream please. You may take your regular medications, including pain medications, with a sip of water before your  appointment.  Diabetics should hold regular insulin (if taken separately) and take 1/2 normal NPH dose the morning of the procedure.  Carry some sugar containing items with you to your appointment. A driver must accompany you and be prepared to drive you home after your procedure. Bring all your current medications with you. An IV may be inserted and sedation may be given at the discretion of the physician. A blood pressure cuff, EKG, and other monitors will often be applied during the procedure.  Some patients may need to have extra oxygen administered for a short period. You will be asked to provide medical information, including allergies, prior to the procedure.  We must know immediately if you are taking blood  Thinners (like Coumadin) or if you are allergic to IV iodine contrast (dye).  Possible side-effects: All are usually temporary Bleeding from needle site Light headedness Numbness and tingling Decreased blood pressure Weakness in arms/legs Pressure sensation in back/neck Pain at injection site (several days)  Possible complications: All are extremely rare Infection Nerve injury Spinal headache (a headache wore with upright position)  Call if you experience: Fever/chills associated with headache or increased back/neck pain Headache worsened by an upright position New onset weakness or numbness of an extremity below the injection site Hives or difficulty breathing (go to the emergency room) Inflammation or drainage at the injection site(s) Severe back/neck pain greater than usual New symptoms which are concerning to you  Please note:  Although the local anesthetic injected can often make your back or neck feel good for several hours after the injection the pain will likely return.  It takes 3-5 days for steroids to work on the nerve root. You may not notice any pain relief for at least one week.  If effective, we will often do a series of 3 injections spaced 3-6 weeks  apart to maximally decrease your pain.    If you have any questions, please call [phone] [facility] Clinic[facility] Patient Information  Description: The epidural space surrounds the nerves as they exit the spinal cord.  In some patients, the nerves can be compressed and inflamed by a bulging disc or a tight spinal canal (spinal stenosis).  By injecting steroids into the epidural space, we can bring irritated nerves into direct contact with a potentially helpful medication.  These steroids act directly on the irritated nerves and can reduce swelling and inflammation which often leads to decreased pain.  Epidural steroids may be injected anywhere along the spine and from the neck to the low back depending upon the location of your pain.   After numbing the skin with local anesthetic (like Novocaine), a small needle is passed into the epidural space slowly.  You may experience a sensation of  pressure while this is being done.  The entire block usually last less than 10 minutes.  Conditions which may be treated by epidural steroids:  Low back and leg pain Neck and arm pain Spinal stenosis Post-laminectomy syndrome Herpes zoster (shingles) pain Pain from compression fractures  Preparation for the injection:  Do not eat any solid food or dairy products within 8 hours of your appointment.  You may drink clear liquids up to 3 hours before appointment.  Clear liquids include water, black coffee, juice or soda.  No milk or cream please. You may take your regular medication, including pain medications, with a sip of water before your appointment  Diabetics should hold regular insulin (if taken separately) and take 1/2 normal NPH dos the morning of the procedure.  Carry some sugar containing items with you to your appointment. A driver must accompany you and be prepared to drive you home after your procedure.  Bring all your current medications with  your. An IV may be inserted and sedation may be given at the discretion of the physician.   A blood pressure cuff, EKG and other monitors will often be applied during the procedure.  Some patients may need to have extra oxygen administered for a short period. You will be asked to provide medical information, including your allergies, prior to the procedure.  We must know immediately if you are taking blood thinners (like Coumadin/Warfarin)  Or if you are allergic to IV iodine contrast (dye). We must know if you could possible be pregnant.  Possible side-effects: Bleeding from needle site Infection (rare, may require surgery) Nerve injury (rare) Numbness & tingling (temporary) Difficulty urinating (rare, temporary) Spinal headache ( a headache worse with upright posture) Light -headedness (temporary) Pain at injection site (several days) Decreased blood pressure (temporary) Weakness in arm/leg (temporary) Pressure sensation in back/neck (temporary)  Call if you experience: Fever/chills associated with headache or increased back/neck pain. Headache worsened by an upright position. New onset weakness or numbness of an extremity below the injection site Hives or difficulty breathing (go to the emergency room) Inflammation or drainage at the infection site Severe back/neck pain Any new symptoms which are concerning to you  Please note:  Although the local anesthetic injected can often make your back or neck feel good for several hours after the injection, the pain will likely return.  It takes 3-7 days for steroids to work in the epidural space.  You may not notice any pain relief for at least that one week.  If effective, we will often do a series of three injections spaced 3-6 weeks apart to maximally decrease your pain.  After the initial series, we generally will wait several months before considering a repeat injection of the same type.  If you have any questions, please call [phone] [facility]

## 2021-10-31 NOTE — Progress Notes (Signed)
Safety precautions to be maintained throughout the outpatient stay will include: orient to surroundings, keep bed in low position, maintain call bell within reach at all times, provide assistance with transfer out of bed and ambulation.  

## 2021-11-01 ENCOUNTER — Telehealth: Payer: Self-pay | Admitting: *Deleted

## 2021-11-01 NOTE — Telephone Encounter (Signed)
No problems post procedure. 

## 2021-11-03 ENCOUNTER — Telehealth: Payer: BC Managed Care – PPO

## 2021-11-04 DIAGNOSIS — G4733 Obstructive sleep apnea (adult) (pediatric): Secondary | ICD-10-CM | POA: Diagnosis not present

## 2021-11-07 ENCOUNTER — Encounter: Payer: Self-pay | Admitting: Family Medicine

## 2021-11-07 ENCOUNTER — Ambulatory Visit: Payer: BC Managed Care – PPO | Admitting: Family Medicine

## 2021-11-07 VITALS — BP 123/84 | HR 98 | Temp 98.7°F | Wt 172.0 lb

## 2021-11-07 DIAGNOSIS — J020 Streptococcal pharyngitis: Secondary | ICD-10-CM

## 2021-11-07 DIAGNOSIS — J029 Acute pharyngitis, unspecified: Secondary | ICD-10-CM

## 2021-11-07 LAB — RAPID STREP SCREEN (MED CTR MEBANE ONLY): Strep Gp A Ag, IA W/Reflex: POSITIVE — AB

## 2021-11-07 MED ORDER — AMOXICILLIN-POT CLAVULANATE 875-125 MG PO TABS: 1.0000 | ORAL_TABLET | Freq: Two times a day (BID) | ORAL | 0 refills | Status: DC

## 2021-11-07 NOTE — Progress Notes (Signed)
BP 123/84   Pulse 98   Temp 98.7 F (37.1 C)   Wt 172 lb (78 kg)   SpO2 97%   BMI 31.46 kg/m    Subjective:    Patient ID: Lisa Galvan, female    DOB: Jan 14, 1961, 61 y.o.   MRN: [id number]  HPI: Lisa Galvan is a 61 y.o. female  Chief Complaint  Patient presents with   URI    Patient states her throat has been sore, and has been coughing. Patient states symptoms started Sunday. Patient grand daughter was positive for strep, would like to tested for strep and COVID    UPPER RESPIRATORY TRACT INFECTION Duration: 3 days Worst symptom: cough and sore throat Fever: no Cough: yes Shortness of breath: no Wheezing: yes Chest pain: no Chest tightness: no Chest congestion: no Nasal congestion: yes Runny nose: yes Post nasal drip: no Sneezing: no Sore throat: yes Swollen glands: no Sinus pressure: no Headache: no Face pain: no Toothache: no Ear pain: no  Ear pressure: no  Eyes red/itching:no Eye drainage/crusting: no  Vomiting: no Rash: no Fatigue: yes Sick contacts: yes Strep contacts: no  Context: worse Recurrent sinusitis: no Relief with OTC cold/cough medications: no  Treatments attempted: none   Relevant past medical, surgical, family and social history reviewed and updated as indicated. Interim medical history since our last visit reviewed. Allergies and medications reviewed and updated.  Review of Systems  Constitutional: Negative.   HENT:  Positive for sore throat, trouble swallowing and voice change. Negative for congestion, dental problem, drooling, ear discharge, ear pain, facial swelling, hearing loss, mouth sores, nosebleeds, postnasal drip, rhinorrhea, sinus pressure, sinus pain, sneezing and tinnitus.   Respiratory:  Positive for cough and wheezing. Negative for apnea, choking, chest tightness, shortness of breath and stridor.   Cardiovascular: Negative.   Musculoskeletal: Negative.   Skin: Negative.   Psychiatric/Behavioral: Negative.       Per HPI unless specifically indicated above     Objective:    BP 123/84   Pulse 98   Temp 98.7 F (37.1 C)   Wt 172 lb (78 kg)   SpO2 97%   BMI 31.46 kg/m   Wt Readings from Last 3 Encounters:  11/07/21 172 lb (78 kg)  10/31/21 174 lb (78.9 kg)  09/26/21 174 lb (78.9 kg)    Physical Exam Vitals and nursing note reviewed.  Constitutional:      General: She is not in acute distress.    Appearance: Normal appearance. She is normal weight. She is not ill-appearing, toxic-appearing or diaphoretic.  HENT:     Head: Normocephalic and atraumatic.     Right Ear: Tympanic membrane, ear canal and external ear normal.     Left Ear: Tympanic membrane, ear canal and external ear normal.     Nose: Nose normal. No congestion or rhinorrhea.     Mouth/Throat:     Mouth: Mucous membranes are moist.     Pharynx: Oropharyngeal exudate and posterior oropharyngeal erythema present.  Eyes:     General: No scleral icterus.       Right eye: No discharge.        Left eye: No discharge.     Extraocular Movements: Extraocular movements intact.     Conjunctiva/sclera: Conjunctivae normal.     Pupils: Pupils are equal, round, and reactive to light.  Cardiovascular:     Rate and Rhythm: Normal rate and regular rhythm.     Pulses: Normal pulses.     Heart  sounds: Normal heart sounds. No murmur heard.    No friction rub. No gallop.  Pulmonary:     Effort: Pulmonary effort is normal. No respiratory distress.     Breath sounds: Normal breath sounds. No stridor. No wheezing, rhonchi or rales.  Chest:     Chest wall: No tenderness.  Musculoskeletal:        General: Normal range of motion.     Cervical back: Normal range of motion and neck supple.  Skin:    General: Skin is warm and dry.     Capillary Refill: Capillary refill takes less than 2 seconds.     Coloration: Skin is not jaundiced or pale.     Findings: No bruising, erythema, lesion or rash.  Neurological:     General: No focal  deficit present.     Mental Status: She is alert and oriented to person, place, and time. Mental status is at baseline.  Psychiatric:        Mood and Affect: Mood normal.        Behavior: Behavior normal.        Thought Content: Thought content normal.        Judgment: Judgment normal.     Results for orders placed or performed in visit on 11/07/21  Rapid Strep screen([facility])   Specimen: Other   Other  Result Value Ref Range   Strep Gp A Ag, IA W/Reflex Positive (A) Negative      Assessment & Plan:   Problem List Items Addressed This Visit   None Visit Diagnoses     Strep throat    -  Primary   Start augmentin. Call if not getting better. Continue to monitor.    Sore throat       + strep   Relevant Orders   Rapid Strep screen([facility]) (Completed)   Novel Coronavirus, NAA ([facility])        Follow up plan: Return if symptoms worsen or fail to improve.

## 2021-11-08 LAB — NOVEL CORONAVIRUS, NAA: SARS-CoV-2, NAA: NOT DETECTED

## 2021-11-09 ENCOUNTER — Ambulatory Visit: Payer: Self-pay | Admitting: *Deleted

## 2021-11-09 NOTE — Telephone Encounter (Signed)
  Chief Complaint: coughing a lot and short of breath at night.   Seen by Dr. Laural Benes 11/07/2021 strep throat.  Throat is better on antibiotic.   Symptoms: Coughing up yellow sputum.   Sleeping with head elevated due to shortness of breath at night and coughing a lot. Frequency: Coughing a lot especially at night. Pertinent Negatives: Patient denies fever Disposition: [] ED /[] Urgent Care (no appt availability in office) / [] Appointment(In office/virtual)/ []  [place] [facility]/ [] Home Care/ [] Refused Recommended Disposition /[] [place] [facility]/ [x]  Follow-up with PCP Additional Notes: Pt. Asked if I would send Dr. a note letting her know about the cough.   She didn't really want to come back into the office.   I let pt know I would do that and someone would call her back after checking with Dr. .    Pt. Agreeable to this plan.

## 2021-11-09 NOTE — Telephone Encounter (Signed)
Please advise 

## 2021-11-09 NOTE — Telephone Encounter (Signed)
Reason for Disposition  [1] Continuous (nonstop) coughing interferes with work or school AND [2] no improvement using cough treatment per Care Advice  Answer Assessment - Initial Assessment Questions 1. ONSET: "When did the cough begin?"      I was seen Tuesday and now this cough is worse.   I'm coughing up yellow sputum.   My throat is better and I'm feeling better with the antibiotic but this cough is terrible especially at night.   I'm short of breath at night too.   I'm sleeping with my head elevated.    2. SEVERITY: "How bad is the cough today?"      It's worse when I lay down I get short of breath 3. SPUTUM: "Describe the color of your sputum" (none, dry cough; clear, white, yellow, green)     Yellow 4. HEMOPTYSIS: "Are you coughing up any blood?" If so ask: "How much?" (flecks, streaks, tablespoons, etc.)     No 5. DIFFICULTY BREATHING: "Are you having difficulty breathing?" If Yes, ask: "How bad is it?" (e.g., mild, moderate, severe)    - MILD: No SOB at rest, mild SOB with walking, speaks normally in sentences, can lie down, no retractions, pulse < 100.    - MODERATE: SOB at rest, SOB with minimal exertion and prefers to sit, cannot lie down flat, speaks in phrases, mild retractions, audible wheezing, pulse 100-120.    - SEVERE: Very SOB at rest, speaks in single words, struggling to breathe, sitting hunched forward, retractions, pulse > 120      When I lay down I'm short of breath 6. FEVER: "Do you have a fever?" If Yes, ask: "What is your temperature, how was it measured, and when did it start?"     No 7. CARDIAC HISTORY: "Do you have any history of heart disease?" (e.g., heart attack, congestive heart failure)      Not asked 8. LUNG HISTORY: "Do you have any history of lung disease?"  (e.g., pulmonary embolus, asthma, emphysema)     No 9. PE RISK FACTORS: "Do you have a history of blood clots?" (or: recent major surgery, recent prolonged travel, bedridden)     Not asked  10.  OTHER SYMPTOMS: "Do you have any other symptoms?" (e.g., runny nose, wheezing, chest pain)       I'm feeling better on the antibiotic but this cough is terrible. 11. PREGNANCY: "Is there any chance you are pregnant?" "When was your last menstrual period?"       N/A 12. TRAVEL: "Have you traveled out of the country in the last month?" (e.g., travel history, exposures)       Not asked  Protocols used: Cough - Acute Productive-A-AH

## 2021-11-09 NOTE — Telephone Encounter (Signed)
Not unusual with the strep. Keep taking the Augmentin and should be feeling better soon

## 2021-11-09 NOTE — Telephone Encounter (Signed)
Patient is aware of provider advise, no further questions.  

## 2021-11-13 NOTE — Progress Notes (Unsigned)
Patient: Lisa Galvan  Service Category: E/M  Provider: Gaspar Cola, MD  DOB: 1961/01/23  DOS: 11/14/2021  Location: Office  MRN: [id number]  Setting: Ambulatory outpatient  Referring Provider: [name], DO  Type: Established Patient  Specialty: Interventional Pain Management  PCP: [name], DO  Location: Remote location  Delivery: TeleHealth     Virtual Encounter - Pain Management PROVIDER NOTE: Information contained herein reflects review and annotations entered in association with encounter. Interpretation of such information and data should be left to medically-trained personnel. Information provided to patient can be located elsewhere in the medical record under "Patient Instructions". Document created using STT-dictation technology, any transcriptional errors that may result from process are unintentional.    Contact & Pharmacy Preferred: [phone] Home: [phone] (home) Mobile: [phone] (mobile) E-mail: [email or web address]  [facility] [place] [address] [place] [address] Phone: [phone] Fax: [phone]   Pre-screening  Ms. Stith offered "in-person" vs "virtual" encounter. She indicated preferring virtual for this encounter.   Reason COVID-19*  Social distancing based on CDC and AMA recommendations.   I contacted [name] on 11/14/2021 via telephone.      I clearly identified myself as Gaspar Cola, MD. I verified that I was speaking with the correct person using two identifiers (Name: Lisset Ketchem, and date of birth: 1960-08-23).  Consent I sought verbal advanced consent from [name] for virtual visit interactions. I informed Ms. Dutt of possible security and privacy concerns, risks, and limitations associated with providing "not-in-person" medical evaluation and management services. I also informed Ms. Fleury of the availability of "in-person"  appointments. Finally, I informed her that there would be a charge for the virtual visit and that she could be  personally, fully or partially, financially responsible for it. Ms. Mcgough expressed understanding and agreed to proceed.   Historic Elements   Ms. Dorlene Footman is a 61 y.o. year old, female patient evaluated today after our last contact on 10/31/2021. Ms. Donate  has a past medical history of Allergy, Anemia, Arthritis, Bilateral leg edema, Chest pain (12/15/2014), Chronic sciatica (12/10/2014), Essential hypertension (12/10/2014), Gastroesophageal reflux disease without esophagitis, Mixed hyperlipidemia, Obesity (BMI 30.0-34.9) (12/10/2014), Sleep apnea, Type 2 diabetes mellitus without complication ([place]) (49/20/1007), and Umbilical hernia. She also  has a past surgical history that includes Esophagogastroduodenoscopy (egd) with propofol (N/A, 03/12/2017); Umbilical hernia repair (N/A, 05/01/2017); Hernia repair; Esophagogastroduodenoscopy (egd) with propofol (N/A, 03/30/2019); Colonoscopy with propofol (N/A, 03/30/2019); ORIF ankle fracture (Left, 07/30/2019); XI robotic assisted ventral hernia (N/A, 10/06/2019); Incisional hernia repair (N/A, 11/29/2020); and Supracervical abdominal hysterectomy. Ms. Berens has a current medication list which includes the following prescription(s): accu-chek softclix lancets, amoxicillin-clavulanate, aspirin ec, atorvastatin, vitamin d-3, duloxetine, duloxetine, epinephrine, fexofenadine, gabapentin, gabapentin, ibuprofen, ketoconazole, magnesium, metformin, montelukast, omeprazole, zenpep, potassium chloride, sucralfate, and vitamin b-12. She  reports that she has never smoked. She has never used smokeless tobacco. She reports current alcohol use of about 3.0 standard drinks of alcohol per week. She reports that she does not use drugs. Ms. Osoria is allergic to benazepril, ivp dye [iodinated contrast media], lidocaine, lyrica [pregabalin], shellfish allergy,  shellfish-derived products, trulicity [dulaglutide], and ace inhibitors.   HPI  Today, she is being contacted for a post-procedure assessment.  Post-procedure evaluation   Procedure 1: Type: Lumbar Trans-Foraminal Epidural Steroid Injection (TFESI) #2  Laterality: Bilateral (-50) Paravertebral Level: L5   Target: Foraminal peri-neural  region, subarticular lateral recess area, and anterior intraspinal epidural space.  Procedure 2: Type: Lumbar Epidural Steroid Injection (LESI) (Inter-laminar) #2  Laterality: Left (-LT) Paravertebral Level: L4-5   Target: Posterior intraspinal epidural space Imaging: Fluoroscopic guidance Anesthesia: Local anesthesia (1-2% Lidocaine) Anxiolysis: None                 Sedation: None. DOS: 10/31/2021  Performed by: Gaspar Cola, MD  Purpose: Diagnostic/Therapeutic Indications: Low back/lower extremity pain severe enough to impact quality of life or function. Rationale (medical necessity): procedure needed and proper for the diagnosis and/or treatment of Ms. Giron's medical symptoms and needs. 1. Lumbosacral radiculopathy at L5 (Bilateral)   2. Numbness and tingling of both legs (L5 dermatome)   3. Lower extremity weakness (Bilateral)   4. Chronic lower extremity pain (2ry area of Pain) (Bilateral)   5. Grade 1 Anterolisthesis of L4/5   6. Ligamentum flavum hypertrophy (L4-5)   7. DDD (degenerative disc disease), lumbar   8. L3 superior endplate closed fracture, sequela   9. Closed compression fracture of L3 lumbar vertebra, sequela   10. T12 superior endplate closed fracture, sequela   11. Chronic low back pain (1ry area of Pain) (Bilateral) w/o sciatica   12. Abnormal MRI, lumbar spine (02/08/2019 & 07/08/2021)    History of Allergy to iodine    History of allergy to radiographic contrast media    History of allergy to shellfish    History of anaphylaxis    NAS-11 Pain score:   Pre-procedure: 3 /10   Post-procedure: 3 /10       Effectiveness:  Initial hour after procedure: 100 %. Subsequent 4-6 hours post-procedure: 100 %. Analgesia past initial 6 hours: 80 % (current). Ongoing improvement:  Analgesic:  *** Function:    ***    ROM:    ***     Pharmacotherapy Assessment   Opioid Analgesic: No opioid analgesics prescribed by our practice.   Monitoring: [place] PMP: PDMP reviewed during this encounter.       Pharmacotherapy: No side-effects or adverse reactions reported. Compliance: No problems identified. Effectiveness: Clinically acceptable. Plan: Refer to "POC". UDS:  Summary  Date Value Ref Range Status  03/11/2019 Note  Final    Comment:    ==================================================================== Compliance Drug Analysis, Ur ==================================================================== Test                             Result       Flag       Units Drug Present   Gabapentin                     PRESENT   Duloxetine                     PRESENT   Ibuprofen                      PRESENT ==================================================================== Test                      Result    Flag   Units      Ref Range   Creatinine              90               mg/dL      >=20 ==================================================================== Declared Medications:  Medication list was not provided. ==================================================================== For clinical  consultation, please call [phone]. ====================================================================    No results found for: "CBDTHCR", "D8THCCBX", "D9THCCBX"   Laboratory Chemistry Profile   Renal Lab Results  Component Value Date   BUN 8 06/06/2021   CREATININE 0.86 06/06/2021   BCR 9 (L) 06/06/2021   GFRAA 89 02/18/2020   GFRNONAA >60 01/31/2021    Hepatic Lab Results  Component Value Date   AST 16 06/06/2021   ALT 10 06/06/2021   ALBUMIN 4.8 06/06/2021   ALKPHOS 79 06/06/2021    AMYLASE 58 10/20/2020   LIPASE 10 (L) 10/20/2020    Electrolytes Lab Results  Component Value Date   NA 142 06/06/2021   K 4.0 06/06/2021   CL 100 06/06/2021   CALCIUM 9.9 06/06/2021   MG 1.5 (L) 03/11/2019   PHOS 4.7 (H) 07/03/2017    Bone Lab Results  Component Value Date   VD25OH 47.3 03/06/2021    Inflammation (CRP: Acute Phase) (ESR: Chronic Phase) Lab Results  Component Value Date   CRP <1 02/10/2019   ESRSEDRATE 27 03/11/2019         Note: Above Lab results reviewed.  Imaging  DG PAIN CLINIC C-ARM 1-60 MIN NO REPORT Fluoro was used, but no Radiologist interpretation will be provided.  Please refer to "NOTES" tab for provider progress note.  Assessment  There were no encounter diagnoses.  Plan of Care  Problem-specific:  No problem-specific Assessment & Plan notes found for this encounter.  Ms. Johnae Friley has a current medication list which includes the following long-term medication(s): atorvastatin, duloxetine, duloxetine, fexofenadine, gabapentin, gabapentin, metformin, montelukast, omeprazole, potassium chloride, and sucralfate.  Pharmacotherapy (Medications Ordered): No orders of the defined types were placed in this encounter.  Orders:  No orders of the defined types were placed in this encounter.  Follow-up plan:   No follow-ups on file.     Interventional Therapies  Risk  Complexity Considerations:   Estimated body mass index is 26.57 kg/m as calculated from the following:   Height as of 01/31/21: '5\' 3"'  (1.6 m).   Weight as of 01/31/21: 150 lb (68 kg). WNL   Planned  Pending:      Under consideration:   Therapeutic left L4-5 LESI #2 + bilateral L5 TFESI #2  Therapeutic caudal ESI #3  Therapeutic Qutenza treatment of her diabetic peripheral neuropathy.   Completed:   Diagnostic/therapeutic right CESI x1 (08/03/2021)  Palliative right lumbar facet RFA x1 (06/02/2019) (100/100/95)  Palliative left lumbar facet RFA x1 (06/30/2019)  (100/100/95)  Palliative bilateral lumbar facet MBB x2 (04/21/2019) (100/100/40/40)  Palliative right sacroiliac joint Blk x1 (04/21/2019) (100/100/60/40)  Diagnostic midline to right caudal ESI x2 (07/04/2021) (75/75/75/75)  Diagnostic/therapeutic bilateral L5 TFESI x1 (09/26/2021) (100/100/75/LBP:75/LEP:100)  Diagnostic/therapeutic left L4-5 LESI x1 (09/26/2021) (100/100/75/LBP:75/LEP:100)    Completed by other providers:   Diagnostic/therapeutic right L4-5 LESI x1 (done by Dr. Primus Bravo on 12/29/2014)   Therapeutic  Palliative (PRN) options:   Therapeutic left L4-5 LESI #2 + bilateral L5 TFESI #2  Palliative lumbar facet RFA  Palliative lumbar facet MBB  Palliative sacroiliac joint Blk  Diagnostic caudal ESI       Recent Visits Date Type Provider Dept  10/31/21 Procedure visit Milinda Pointer, MD [facility]  10/10/21 Office Visit Milinda Pointer, MD [facility]  09/26/21 Procedure visit Milinda Pointer, MD [facility]  08/22/21 Office Visit Milinda Pointer, MD [facility]  Showing recent visits within past 90 days and meeting all other requirements Future Appointments Date Type  Provider Dept  11/14/21 Office Visit Milinda Pointer, MD [facility]  Showing future appointments within next 90 days and meeting all other requirements  I discussed the assessment and treatment plan with the patient. The patient was provided an opportunity to ask questions and all were answered. The patient agreed with the plan and demonstrated an understanding of the instructions.  Patient advised to call back or seek an in-person evaluation if the symptoms or condition worsens.  Duration of encounter: *** minutes.  Note by: Gaspar Cola, MD Date: 11/14/2021; Time: 1:51 PM

## 2021-11-14 ENCOUNTER — Ambulatory Visit: Payer: BC Managed Care – PPO | Attending: Pain Medicine | Admitting: Pain Medicine

## 2021-11-14 DIAGNOSIS — M79604 Pain in right leg: Secondary | ICD-10-CM

## 2021-11-14 DIAGNOSIS — S22089S Unspecified fracture of T11-T12 vertebra, sequela: Secondary | ICD-10-CM

## 2021-11-14 DIAGNOSIS — G8929 Other chronic pain: Secondary | ICD-10-CM

## 2021-11-14 DIAGNOSIS — M79605 Pain in left leg: Secondary | ICD-10-CM

## 2021-11-14 DIAGNOSIS — R937 Abnormal findings on diagnostic imaging of other parts of musculoskeletal system: Secondary | ICD-10-CM

## 2021-11-14 DIAGNOSIS — M4310 Spondylolisthesis, site unspecified: Secondary | ICD-10-CM

## 2021-11-14 DIAGNOSIS — M545 Low back pain, unspecified: Secondary | ICD-10-CM

## 2021-11-14 DIAGNOSIS — S32039S Unspecified fracture of third lumbar vertebra, sequela: Secondary | ICD-10-CM

## 2021-11-14 DIAGNOSIS — M5417 Radiculopathy, lumbosacral region: Secondary | ICD-10-CM | POA: Diagnosis not present

## 2021-11-14 DIAGNOSIS — R29898 Other symptoms and signs involving the musculoskeletal system: Secondary | ICD-10-CM

## 2021-11-27 ENCOUNTER — Telehealth: Payer: BC Managed Care – PPO

## 2021-11-29 ENCOUNTER — Telehealth: Payer: Self-pay | Admitting: Licensed Clinical Social Worker

## 2021-11-29 NOTE — Telephone Encounter (Signed)
    Clinical Social Work  Care Management   Phone Outreach    11/29/2021 Name: Lisa Galvan MRN: [id number] DOB: Nov 04, 1960  Lisa Galvan is a 61 y.o. year old female who is a primary care patient of Dorcas Carrow, DO .   Reason for referral: Mental Health Counseling and Resources.    F/U phone call today to assess needs, progress and barriers with care plan goals.   Telephone outreach was unsuccessful. A HIPPA compliant phone message was left for the patient providing contact information and requesting a return call.   Plan:CCM LCSW will wait for return call.  Review of patient status, including review of consultants reports, relevant laboratory and other test results, and collaboration with appropriate care team members and the patient's provider was performed as part of comprehensive patient evaluation and provision of care management services.    Jenel Lucks, MSW, LCSW [facility] Care Management [place]  [facility] [email or web address] Phone [phone] 7:56 AM

## 2021-12-02 ENCOUNTER — Other Ambulatory Visit: Payer: Self-pay | Admitting: Family Medicine

## 2021-12-02 DIAGNOSIS — E1140 Type 2 diabetes mellitus with diabetic neuropathy, unspecified: Secondary | ICD-10-CM

## 2021-12-04 ENCOUNTER — Ambulatory Visit: Payer: BC Managed Care – PPO | Admitting: Family Medicine

## 2021-12-04 ENCOUNTER — Encounter: Payer: Self-pay | Admitting: Family Medicine

## 2021-12-04 VITALS — BP 130/88 | HR 100 | Temp 98.1°F | Wt 166.0 lb

## 2021-12-04 DIAGNOSIS — E559 Vitamin D deficiency, unspecified: Secondary | ICD-10-CM | POA: Diagnosis not present

## 2021-12-04 DIAGNOSIS — M792 Neuralgia and neuritis, unspecified: Secondary | ICD-10-CM

## 2021-12-04 DIAGNOSIS — R0989 Other specified symptoms and signs involving the circulatory and respiratory systems: Secondary | ICD-10-CM

## 2021-12-04 DIAGNOSIS — E1140 Type 2 diabetes mellitus with diabetic neuropathy, unspecified: Secondary | ICD-10-CM

## 2021-12-04 DIAGNOSIS — I10 Essential (primary) hypertension: Secondary | ICD-10-CM

## 2021-12-04 DIAGNOSIS — R490 Dysphonia: Secondary | ICD-10-CM

## 2021-12-04 DIAGNOSIS — E785 Hyperlipidemia, unspecified: Secondary | ICD-10-CM

## 2021-12-04 LAB — BAYER DCA HB A1C WAIVED: HB A1C (BAYER DCA - WAIVED): 7.8 % — ABNORMAL HIGH (ref 4.8–5.6)

## 2021-12-04 MED ORDER — SUCRALFATE 1 G PO TABS: 1.0000 g | ORAL_TABLET | Freq: Three times a day (TID) | ORAL | 1 refills | Status: DC

## 2021-12-04 MED ORDER — METFORMIN HCL 500 MG PO TABS: 1000.0000 mg | ORAL_TABLET | Freq: Two times a day (BID) | ORAL | 1 refills | Status: DC

## 2021-12-04 MED ORDER — POTASSIUM CHLORIDE CRYS ER 10 MEQ PO TBCR
EXTENDED_RELEASE_TABLET | ORAL | 1 refills | Status: DC
Start: 2021-12-04 — End: 2022-01-15

## 2021-12-04 MED ORDER — OMEPRAZOLE 40 MG PO CPDR: DELAYED_RELEASE_CAPSULE | ORAL | 1 refills | Status: DC

## 2021-12-04 MED ORDER — DULOXETINE HCL 60 MG PO CPEP: 60.0000 mg | ORAL_CAPSULE | Freq: Every day | ORAL | 1 refills | Status: DC

## 2021-12-04 MED ORDER — MONTELUKAST SODIUM 10 MG PO TABS
10.0000 mg | ORAL_TABLET | Freq: Every day | ORAL | 1 refills | Status: DC
Start: 2021-12-04 — End: 2022-04-17

## 2021-12-04 MED ORDER — GABAPENTIN 300 MG PO CAPS
ORAL_CAPSULE | ORAL | 1 refills | Status: DC
Start: 2021-12-04 — End: 2022-01-15

## 2021-12-04 MED ORDER — ATORVASTATIN CALCIUM 20 MG PO TABS: 20.0000 mg | ORAL_TABLET | Freq: Every day | ORAL | 1 refills | Status: DC

## 2021-12-04 NOTE — Telephone Encounter (Signed)
Requested Prescriptions  Pending Prescriptions Disp Refills  . metFORMIN (GLUCOPHAGE) 500 MG tablet [Pharmacy Med Name: METFORMIN 500MG TABLETS] 360 tablet 1    Sig: TAKE 2 TABLETS(1000 MG) BY MOUTH TWICE DAILY WITH A MEAL     Endocrinology:  Diabetes - Biguanides Failed - 12/02/2021  7:04 AM      Failed - B12 Level in normal range and within 720 days    Vitamin B-12  Date Value Ref Range Status  04/26/2021 1,766 (H) 232 - 1,245 pg/mL Final         Passed - Cr in normal range and within 360 days    Creatinine, Ser  Date Value Ref Range Status  06/06/2021 0.86 0.57 - 1.00 mg/dL Final         Passed - HBA1C is between 0 and 7.9 and within 180 days    HB A1C (BAYER DCA - WAIVED)  Date Value Ref Range Status  12/04/2021 7.8 (H) 4.8 - 5.6 % Final    Comment:             Prediabetes: 5.7 - 6.4          Diabetes: >6.4          Glycemic control for adults with diabetes: <7.0          Passed - eGFR in normal range and within 360 days    GFR calc Af Amer  Date Value Ref Range Status  02/18/2020 89 >59 mL/min/1.73 Final    Comment:    **In accordance with recommendations from the NKF-ASN Task force,**   [facility] is in the process of updating its eGFR calculation to the   2021 CKD-EPI creatinine equation that estimates kidney function   without a race variable.    GFR, Estimated  Date Value Ref Range Status  01/31/2021 >60 >60 mL/min Final    Comment:    (NOTE) Calculated using the CKD-EPI Creatinine Equation (2021)    eGFR  Date Value Ref Range Status  06/06/2021 76 >59 mL/min/1.73 Final         Passed - Valid encounter within last 6 months    Recent Outpatient Visits          Today Essential hypertension   [facility], Lisa P, DO   3 weeks ago Strep throat   [facility] [place], Lisa P, DO   3 months ago OSA (obstructive sleep apnea)   [facility], Lisa P, DO   6 months ago Type 2 diabetes mellitus with  diabetic neuropathy, without long-term current use of insulin ([place])   [place], [place], DO   6 months ago Neck pain   [facility] [place], [place], DO      Future Appointments            In 3 months Johnson, Lisa P, DO [place]           Passed - CBC within normal limits and completed in the last 12 months    WBC  Date Value Ref Range Status  06/06/2021 3.7 3.4 - 10.8 x10E3/uL Final  01/31/2021 2.8 (L) 4.0 - 10.5 K/uL Final   RBC  Date Value Ref Range Status  06/06/2021 4.49 3.77 - 5.28 x10E6/uL Final  01/31/2021 3.90 3.87 - 5.11 MIL/uL Final   Hemoglobin  Date Value Ref Range Status  06/06/2021 12.7 11.1 - 15.9 g/dL Final   Hematocrit  Date Value Ref Range  Status  06/06/2021 38.5 34.0 - 46.6 % Final   MCHC  Date Value Ref Range Status  06/06/2021 33.0 31.5 - 35.7 g/dL Final  01/31/2021 35.3 30.0 - 36.0 g/dL Final   [facility]  Date Value Ref Range Status  06/06/2021 28.3 26.6 - 33.0 pg Final  01/31/2021 31.5 26.0 - 34.0 pg Final   MCV  Date Value Ref Range Status  06/06/2021 86 79 - 97 fL Final   No results found for: "PLTCOUNTKUC", "LABPLAT", "POCPLA" RDW  Date Value Ref Range Status  06/06/2021 14.9 11.7 - 15.4 % Final

## 2021-12-04 NOTE — Progress Notes (Signed)
BP 130/88   Pulse 100   Temp 98.1 F (36.7 C)   Wt 166 lb (75.3 kg)   SpO2 98%   BMI 30.36 kg/m    Subjective:    Patient ID: Lisa Galvan, female    DOB: December 14, 1960, 61 y.o.   MRN: [id number]  HPI: Lisa Galvan is a 61 y.o. female  Chief Complaint  Patient presents with   Hypertension   Diabetes   Hyperlipidemia   Choking    Patient states she has a lot of saliva in her mouth, causing her to choke a lot    DIABETES Hypoglycemic episodes:no Polydipsia/polyuria: no Visual disturbance: no Chest pain: no Paresthesias: no Glucose Monitoring: no  Accucheck frequency: Not Checking Taking Insulin?: no Blood Pressure Monitoring: not checking Retinal Examination: Up to Date Foot Exam: Up to Date Diabetic Education: Completed Pneumovax: Up to Date Influenza: Up to Date Aspirin: yes  HYPERTENSION / HYPERLIPIDEMIA Satisfied with current treatment? yes Duration of hypertension: chronic BP monitoring frequency: not checking BP medication side effects: no Past BP meds: none Duration of hyperlipidemia: chronic Cholesterol medication side effects: no Cholesterol supplements: none Past cholesterol medications: atorvastatin Medication compliance: excellent compliance Aspirin: yes Recent stressors: no Recurrent headaches: no Visual changes: no Palpitations: no Dyspnea: no Chest pain: no Lower extremity edema: no Dizzy/lightheaded: no  CHOKING- happening even with her saliva Duration: 3 weeks Description of symptom: choked Onset: Immediately upon swallowing Location of dysphagia: back of throat  Dysphagia to solids only: no Dysphagia to solids & liquids: yes  Frequency:intermittent  Progressively getting worse: yes Alleviatiating factors: nothing Provoking factors: unknown Status: worse EGD: no Weight loss: yes Sensation of lump in throat: no Heartburn: no Odynophagia: no Nausea: no Vomiting: no Drooling: yes Nasal regurgitation/food spillage:  no Coughing: yes Choking: yes Dysphonia: yes Dysarthria: no Hematemesis: no Regurgitation of undigested food/halitosis: no Chest pain: no   Relevant past medical, surgical, family and social history reviewed and updated as indicated. Interim medical history since our last visit reviewed. Allergies and medications reviewed and updated.  Review of Systems  Constitutional: Negative.   HENT:  Positive for drooling and trouble swallowing. Negative for congestion, dental problem, ear discharge, ear pain, facial swelling, hearing loss, mouth sores, nosebleeds, postnasal drip, rhinorrhea, sinus pressure, sinus pain, sneezing, sore throat, tinnitus and voice change.   Respiratory: Negative.    Cardiovascular: Negative.   Musculoskeletal: Negative.   Skin: Negative.   Neurological:  Positive for headaches. Negative for dizziness, tremors, seizures, syncope, facial asymmetry, speech difficulty, weakness, light-headedness and numbness.  Psychiatric/Behavioral: Negative.      Per HPI unless specifically indicated above     Objective:    BP 130/88   Pulse 100   Temp 98.1 F (36.7 C)   Wt 166 lb (75.3 kg)   SpO2 98%   BMI 30.36 kg/m   Wt Readings from Last 3 Encounters:  12/04/21 166 lb (75.3 kg)  11/07/21 172 lb (78 kg)  10/31/21 174 lb (78.9 kg)    Physical Exam Vitals and nursing note reviewed.  Constitutional:      General: She is not in acute distress.    Appearance: Normal appearance. She is not ill-appearing, toxic-appearing or diaphoretic.  HENT:     Head: Normocephalic and atraumatic.     Right Ear: External ear normal.     Left Ear: External ear normal.     Nose: Nose normal.     Mouth/Throat:     Mouth: Mucous membranes are moist.  Pharynx: Oropharynx is clear.  Eyes:     General: No scleral icterus.       Right eye: No discharge.        Left eye: No discharge.     Extraocular Movements: Extraocular movements intact.     Conjunctiva/sclera: Conjunctivae  normal.     Pupils: Pupils are equal, round, and reactive to light.  Cardiovascular:     Rate and Rhythm: Normal rate and regular rhythm.     Pulses: Normal pulses.     Heart sounds: Normal heart sounds. No murmur heard.    No friction rub. No gallop.  Pulmonary:     Effort: Pulmonary effort is normal. No respiratory distress.     Breath sounds: Normal breath sounds. No stridor. No wheezing, rhonchi or rales.  Chest:     Chest wall: No tenderness.  Musculoskeletal:        General: Normal range of motion.     Cervical back: Normal range of motion and neck supple.  Skin:    General: Skin is warm and dry.     Capillary Refill: Capillary refill takes less than 2 seconds.     Coloration: Skin is not jaundiced or pale.     Findings: No bruising, erythema, lesion or rash.  Neurological:     General: No focal deficit present.     Mental Status: She is alert and oriented to person, place, and time. Mental status is at baseline.  Psychiatric:        Mood and Affect: Mood normal.        Behavior: Behavior normal.        Thought Content: Thought content normal.        Judgment: Judgment normal.     Results for orders placed or performed in visit on 11/07/21  Rapid Strep screen([facility])   Specimen: Other   Other  Result Value Ref Range   Strep Gp A Ag, IA W/Reflex Positive (A) Negative  Novel Coronavirus, NAA ([facility])   Specimen: Nasopharyngeal(NP) swabs in vial transport medium  Result Value Ref Range   SARS-CoV-2, NAA Not Detected Not Detected      Assessment & Plan:   Problem List Items Addressed This Visit       Cardiovascular and Mediastinum   Essential hypertension - Primary    Under good control on current regimen. Continue current regimen. Continue to monitor. Call with any concerns. Labs drawn today.        Relevant Medications   atorvastatin (LIPITOR) 20 MG tablet   Other Relevant Orders   Comprehensive metabolic panel   CBC with Differential/Platelet      Endocrine   Type 2 diabetes mellitus with diabetic neuropathy, unspecified ([place])    Not doing well with a1c up to 7.8 from 6.1. Offered to restart jardiance, she would like to hold on that for now. Will really watch her diet for the next 3 months and return for recheck. If sugar >7- will consider restarting jardiance.       Relevant Medications   atorvastatin (LIPITOR) 20 MG tablet   metFORMIN (GLUCOPHAGE) 500 MG tablet   Other Relevant Orders   Comprehensive metabolic panel   CBC with Differential/Platelet   Bayer DCA Hb A1c Waived     Other   Neurogenic pain (Chronic)   Relevant Medications   DULoxetine (CYMBALTA) 60 MG capsule   Hyperlipidemia    Under good control on current regimen. Continue current regimen. Continue to monitor. Call with any concerns.  Refills given. Labs drawn today.       Relevant Medications   atorvastatin (LIPITOR) 20 MG tablet   Other Relevant Orders   Comprehensive metabolic panel   CBC with Differential/Platelet   Lipid Panel w/o Chol/HDL Ratio   Vitamin D deficiency    Rechecking labs today. Await results. Treat as needed.       Relevant Orders   Comprehensive metabolic panel   CBC with Differential/Platelet   VITAMIN D 25 Hydroxy (Vit-D Deficiency, Fractures)   Other Visit Diagnoses     Dysphonia       No sign of thrush on exam, oral cavity looks normal. Will get her into ENT for evaluation of posterior pharynx. Call with any concerns.    Relevant Orders   Ambulatory referral to ENT   Choking sensation       No sign of thrush on exam, oral cavity looks normal. Will get her into ENT for evaluation of posterior pharynx. Call with any concerns.    Relevant Orders   Ambulatory referral to ENT        Follow up plan: Return in about 3 months (around 03/06/2022).

## 2021-12-04 NOTE — Assessment & Plan Note (Signed)
Under good control on current regimen. Continue current regimen. Continue to monitor. Call with any concerns. Labs drawn today.  

## 2021-12-04 NOTE — Telephone Encounter (Signed)
Duplicate request

## 2021-12-04 NOTE — Assessment & Plan Note (Signed)
Under good control on current regimen. Continue current regimen. Continue to monitor. Call with any concerns. Refills given. Labs drawn today.   

## 2021-12-04 NOTE — Assessment & Plan Note (Signed)
Rechecking labs today. Await results. Treat as needed.  °

## 2021-12-04 NOTE — Assessment & Plan Note (Signed)
Not doing well with a1c up to 7.8 from 6.1. Offered to restart jardiance, she would like to hold on that for now. Will really watch her diet for the next 3 months and return for recheck. If sugar >7- will consider restarting jardiance.

## 2021-12-05 ENCOUNTER — Telehealth: Payer: Self-pay | Admitting: Family Medicine

## 2021-12-05 DIAGNOSIS — G4733 Obstructive sleep apnea (adult) (pediatric): Secondary | ICD-10-CM | POA: Diagnosis not present

## 2021-12-05 LAB — LIPID PANEL W/O CHOL/HDL RATIO
Cholesterol, Total: 177 mg/dL (ref 100–199)
HDL: 110 mg/dL (ref >39)
LDL Chol Calc (NIH): 51 mg/dL (ref 0–99)
Triglycerides: 90 mg/dL (ref 0–149)
VLDL Cholesterol Cal: 16 mg/dL (ref 5–40)

## 2021-12-05 LAB — COMPREHENSIVE METABOLIC PANEL
ALT: 20 IU/L (ref 0–32)
AST: 39 IU/L (ref 0–40)
Albumin/Globulin Ratio: 1.5 (ref 1.2–2.2)
Albumin: 3.4 g/dL — ABNORMAL LOW (ref 3.9–4.9)
Alkaline Phosphatase: 127 IU/L — ABNORMAL HIGH (ref 44–121)
BUN/Creatinine Ratio: 4 — ABNORMAL LOW (ref 12–28)
BUN: 3 mg/dL — ABNORMAL LOW (ref 8–27)
Bilirubin Total: 0.9 mg/dL (ref 0.0–1.2)
CO2: 24 mmol/L (ref 20–29)
Calcium: 9.1 mg/dL (ref 8.7–10.3)
Chloride: 101 mmol/L (ref 96–106)
Creatinine, Ser: 0.78 mg/dL (ref 0.57–1.00)
Globulin, Total: 2.3 g/dL (ref 1.5–4.5)
Glucose: 141 mg/dL — ABNORMAL HIGH (ref 70–99)
Potassium: 3.5 mmol/L (ref 3.5–5.2)
Sodium: 142 mmol/L (ref 134–144)
Total Protein: 5.7 g/dL — ABNORMAL LOW (ref 6.0–8.5)
eGFR: 86 mL/min/{1.73_m2} (ref >59)

## 2021-12-05 LAB — CBC WITH DIFFERENTIAL/PLATELET
Basophils Absolute: 0 10*3/uL (ref 0.0–0.2)
Basos: 0 %
EOS (ABSOLUTE): 0 10*3/uL (ref 0.0–0.4)
Eos: 1 %
Hematocrit: 36.1 % (ref 34.0–46.6)
Hemoglobin: 12.4 g/dL (ref 11.1–15.9)
Immature Grans (Abs): 0 10*3/uL (ref 0.0–0.1)
Immature Granulocytes: 0 %
Lymphocytes Absolute: 1.9 10*3/uL (ref 0.7–3.1)
Lymphs: 56 %
MCH: 32.7 pg (ref 26.6–33.0)
MCHC: 34.3 g/dL (ref 31.5–35.7)
MCV: 95 fL (ref 79–97)
Monocytes Absolute: 0.4 10*3/uL (ref 0.1–0.9)
Monocytes: 10 %
Neutrophils Absolute: 1.1 10*3/uL — ABNORMAL LOW (ref 1.4–7.0)
Neutrophils: 33 %
Platelets: 254 10*3/uL (ref 150–450)
RBC: 3.79 x10E6/uL (ref 3.77–5.28)
RDW: 16.4 % — ABNORMAL HIGH (ref 11.7–15.4)
WBC: 3.5 10*3/uL (ref 3.4–10.8)

## 2021-12-05 LAB — VITAMIN D 25 HYDROXY (VIT D DEFICIENCY, FRACTURES): Vit D, 25-Hydroxy: 53.7 ng/mL (ref 30.0–100.0)

## 2021-12-05 MED ORDER — SUCRALFATE 1 G PO TABS: 1.0000 g | ORAL_TABLET | Freq: Three times a day (TID) | ORAL | 1 refills | Status: DC

## 2021-12-05 NOTE — Telephone Encounter (Signed)
RX has 2 sets of directions. Please clarify and resend.

## 2021-12-05 NOTE — Telephone Encounter (Signed)
sucralfate (CARAFATE) 1 g tablet 270 tablet 1 12/04/2021    Sig - Route: Take 1 tablet (1 g total) by mouth 4 (four) times daily -  with meals and at bedtime. TAKE 1 TABLET(1 GRAM) BY MOUTH THREE TIMES DAILY - Oral   Sent to pharmacy as: sucralfate (CARAFATE) 1 g tablet   Notes to Pharmacy: **Patient requests 90 days supply    Pharmacy called... resend script above, two (2) sets of instructions Take 1 by mouth 3 times OR  Take 1 tablet by mouth 4 times   [id number] [place], [place] - [address] AT [facility] OF [address]  [address] [place] [address]  Phone: [phone] Fax: [phone]

## 2021-12-11 ENCOUNTER — Emergency Department
Admission: EM | Admit: 2021-12-11 | Discharge: 2021-12-11 | Disposition: A | Discharge status: Home / Self Care | Payer: BC Managed Care – PPO | Attending: Emergency Medicine | Admitting: Emergency Medicine

## 2021-12-11 ENCOUNTER — Encounter: Payer: Self-pay | Admitting: Intensive Care

## 2021-12-11 ENCOUNTER — Emergency Department: Payer: BC Managed Care – PPO

## 2021-12-11 ENCOUNTER — Other Ambulatory Visit: Payer: Self-pay

## 2021-12-11 DIAGNOSIS — E876 Hypokalemia: Secondary | ICD-10-CM | POA: Diagnosis not present

## 2021-12-11 DIAGNOSIS — R000 Tachycardia, unspecified: Secondary | ICD-10-CM | POA: Insufficient documentation

## 2021-12-11 DIAGNOSIS — R4182 Altered mental status, unspecified: Secondary | ICD-10-CM | POA: Diagnosis not present

## 2021-12-11 DIAGNOSIS — E119 Type 2 diabetes mellitus without complications: Secondary | ICD-10-CM | POA: Diagnosis not present

## 2021-12-11 DIAGNOSIS — I10 Essential (primary) hypertension: Secondary | ICD-10-CM | POA: Diagnosis not present

## 2021-12-11 DIAGNOSIS — R42 Dizziness and giddiness: Secondary | ICD-10-CM | POA: Diagnosis not present

## 2021-12-11 DIAGNOSIS — R519 Headache, unspecified: Secondary | ICD-10-CM | POA: Diagnosis not present

## 2021-12-11 HISTORY — DX: Polyneuropathy, unspecified: G62.9

## 2021-12-11 LAB — URINALYSIS, ROUTINE W REFLEX MICROSCOPIC
Bilirubin Urine: NEGATIVE
Glucose, UA: NEGATIVE mg/dL
Hgb urine dipstick: NEGATIVE
Ketones, ur: NEGATIVE mg/dL
Nitrite: NEGATIVE
Protein, ur: NEGATIVE mg/dL
Specific Gravity, Urine: 1.011 (ref 1.005–1.030)
pH: 5 (ref 5.0–8.0)

## 2021-12-11 LAB — BASIC METABOLIC PANEL
Anion gap: 9 (ref 5–15)
BUN: <5 mg/dL — ABNORMAL LOW (ref 8–23)
CO2: 28 mmol/L (ref 22–32)
Calcium: 9.1 mg/dL (ref 8.9–10.3)
Chloride: 103 mmol/L (ref 98–111)
Creatinine, Ser: 0.74 mg/dL (ref 0.44–1.00)
GFR, Estimated: >60 mL/min (ref >60)
Glucose, Bld: 135 mg/dL — ABNORMAL HIGH (ref 70–99)
Potassium: 3.1 mmol/L — ABNORMAL LOW (ref 3.5–5.1)
Sodium: 140 mmol/L (ref 135–145)

## 2021-12-11 LAB — CBC
HCT: 38.4 % (ref 36.0–46.0)
Hemoglobin: 12.9 g/dL (ref 12.0–15.0)
MCH: 32.3 pg (ref 26.0–34.0)
MCHC: 33.6 g/dL (ref 30.0–36.0)
MCV: 96.2 fL (ref 80.0–100.0)
Platelets: 228 10*3/uL (ref 150–400)
RBC: 3.99 MIL/uL (ref 3.87–5.11)
RDW: 16.3 % — ABNORMAL HIGH (ref 11.5–15.5)
WBC: 3.3 10*3/uL — ABNORMAL LOW (ref 4.0–10.5)
nRBC: 0 % (ref 0.0–0.2)

## 2021-12-11 LAB — MAGNESIUM: Magnesium: 1.5 mg/dL — ABNORMAL LOW (ref 1.7–2.4)

## 2021-12-11 MED ORDER — LACTATED RINGERS IV BOLUS
1000.0000 mL | Freq: Once | INTRAVENOUS | Status: AC
  Administered 2021-12-11: 1000 mL via INTRAVENOUS

## 2021-12-11 MED ORDER — MAGNESIUM SULFATE 2 GM/50ML IV SOLN
2.0000 g | Freq: Once | INTRAVENOUS | Status: AC
  Administered 2021-12-11: 2 g via INTRAVENOUS
  Filled 2021-12-11: qty 50

## 2021-12-11 MED ORDER — DIPHENHYDRAMINE HCL 50 MG/ML IJ SOLN
25.0000 mg | Freq: Once | INTRAMUSCULAR | Status: DC
  Filled 2021-12-11: qty 1

## 2021-12-11 MED ORDER — POTASSIUM CHLORIDE CRYS ER 20 MEQ PO TBCR
40.0000 meq | EXTENDED_RELEASE_TABLET | Freq: Once | ORAL | Status: AC
  Administered 2021-12-11: 40 meq via ORAL
  Filled 2021-12-11: qty 2

## 2021-12-11 MED ORDER — PROCHLORPERAZINE EDISYLATE 10 MG/2ML IJ SOLN
10.0000 mg | Freq: Once | INTRAMUSCULAR | Status: DC
  Filled 2021-12-11: qty 2

## 2021-12-11 NOTE — ED Notes (Signed)
Patient transported to CT 

## 2021-12-11 NOTE — ED Provider Notes (Signed)
[facility] Provider Note    Event Date/Time   First MD Initiated Contact with Patient 12/11/21 [phone]     (approximate)   History   Chief Complaint Dizziness and Altered Mental Status   HPI  Lisa Galvan is a 61 y.o. female with past medical history of hypertension, hyperlipidemia, diabetes, GERD, and chronic pain syndrome who presents to the ED complaining of dizziness.  Patient reports that for the past 3 days she has been feeling with intermittent sensation of dizziness and lightheadedness.  She states it will start to feel like she might pass out, but she has never lost consciousness.  Symptoms seem to be worse when she is bending over, she denies any sensation that the room is spinning around her.  She has felt foggy headed at times with some disorientation, but denies any vision changes, speech changes, numbness, or weakness.  She denies any falls or other head trauma.  She has not had any fevers, cough, chest pain, shortness of breath, abdominal pain, or dysuria.  She does endorse nausea and headache recently but has been gradually worsening since onset of symptoms.     Physical Exam   Triage Vital Signs: ED Triage Vitals  Enc Vitals Group     BP 12/11/21 0921 118/83     Pulse Rate 12/11/21 0921 (!) 110     Resp 12/11/21 0921 16     Temp 12/11/21 0921 98.2 F (36.8 C)     Temp Source 12/11/21 0921 Oral     SpO2 12/11/21 0921 98 %     Weight 12/11/21 0922 162 lb (73.5 kg)     Height 12/11/21 0922 5\' 2"  (1.575 m)     Head Circumference --      Peak Flow --      Pain Score 12/11/21 0921 7     Pain Loc --      Pain Edu? --      Excl. in GC? --     Most recent vital signs: Vitals:   12/11/21 0921 12/11/21 1028  BP: 118/83 (!) 153/97  Pulse: (!) 110 91  Resp: 16 20  Temp: 98.2 F (36.8 C)   SpO2: 98% 97%    Constitutional: Alert and oriented. Eyes: Conjunctivae are normal.  Pupils equal, round, and reactive to light bilaterally. Head:  Atraumatic. Nose: No congestion/rhinnorhea. Mouth/Throat: Mucous membranes are moist.  Neck: Supple with no meningismus. Cardiovascular: Normal rate, regular rhythm. Grossly normal heart sounds.  2+ radial pulses bilaterally. Respiratory: Normal respiratory effort.  No retractions. Lungs CTAB. Gastrointestinal: Soft and nontender. No distention. Musculoskeletal: No lower extremity tenderness nor edema.  Neurologic:  Normal speech and language. No gross focal neurologic deficits are appreciated.    ED Results / Procedures / Treatments   Labs (all labs ordered are listed, but only abnormal results are displayed) Labs Reviewed  BASIC METABOLIC PANEL - Abnormal; Notable for the following components:      Result Value   Potassium 3.1 (*)    Glucose, Bld 135 (*)    BUN <5 (*)    All other components within normal limits  CBC - Abnormal; Notable for the following components:   WBC 3.3 (*)    RDW 16.3 (*)    All other components within normal limits  URINALYSIS, ROUTINE W REFLEX MICROSCOPIC - Abnormal; Notable for the following components:   Color, Urine YELLOW (*)    APPearance HAZY (*)    Leukocytes,Ua SMALL (*)  Bacteria, UA RARE (*)    All other components within normal limits  MAGNESIUM - Abnormal; Notable for the following components:   Magnesium 1.5 (*)    All other components within normal limits     EKG  ED ECG REPORT I, Chesley Noon, the attending physician, personally viewed and interpreted this ECG.   Date: 12/11/2021  EKG Time: 9:24  Rate: 105  Rhythm: sinus tachycardia  Axis: Normal  Intervals:none  ST&T Change: None  RADIOLOGY CT head reviewed and interpreted by me with no infiltrate, edema, or effusion.  PROCEDURES:  Critical Care performed: No  Procedures   MEDICATIONS ORDERED IN ED: Medications  magnesium sulfate IVPB 2 g 50 mL (2 g Intravenous New Bag/Given 12/11/21 1116)  lactated ringers bolus 1,000 mL (1,000 mLs Intravenous New  Bag/Given 12/11/21 1039)  potassium chloride SA (KLOR-CON M) CR tablet 40 mEq (40 mEq Oral Given 12/11/21 1113)     IMPRESSION / MDM / ASSESSMENT AND PLAN / ED COURSE  I reviewed the triage vital signs and the nursing notes.                              61 y.o. female with past medical history of hypertension, hyperlipidemia, diabetes, GERD, and chronic pain syndrome who presents to the ED with 3 days of worsening dizziness and lightheadedness, worse with positional changes, associated with some headache, nausea, and disorientation.  Patient's presentation is most consistent with acute presentation with potential threat to life or bodily function.  Differential diagnosis includes, but is not limited to, arrhythmia, ACS, PE, pneumonia, electrolyte abnormality, anemia, AKI, stroke, TIA, migraine headache, UTI.  Patient nontoxic-appearing and in no acute distress, vital signs remarkable for tachycardia but otherwise reassuring.  Patient has no focal neurologic deficits on exam and I have low suspicion for stroke, especially given dizziness is described as lightheadedness rather than vertiginous.  CT head is negative for acute process.  EKG shows no evidence of arrhythmia or ischemia and I will suspicion for cardiac etiology for her symptoms given no chest pain or shortness of breath.  Urinalysis shows no signs of infection.  CBC shows no significant anemia or leukocytosis, BMP pending at this time and we will also screen magnesium level.  Plan to treat symptomatically with IV Compazine and Benadryl, hydrate with IV fluids and reassess.  BMP remarkable for hypokalemia, magnesium level is also noted to be low, both of which likely contributing to her symptoms.  Patient reports feeling better following repletion of potassium and magnesium as well as IV fluid bolus.  She is appropriate for discharge home with PCP follow-up for recheck of her electrolytes.  She was counseled to return to the ED for new or  worsening symptoms, patient agrees with plan.      FINAL CLINICAL IMPRESSION(S) / ED DIAGNOSES   Final diagnoses:  Dizziness  Hypomagnesemia  Hypokalemia     Rx / DC Orders   ED Discharge Orders     None        Note:  This document was prepared using Dragon voice recognition software and may include unintentional dictation errors.   Chesley Noon, MD 12/11/21 1204

## 2021-12-11 NOTE — ED Triage Notes (Signed)
Patient reports feeling disoriented last night and dizziness that she still feels this AM after awakening. C/o head pain. Denies urinary symptoms. Patient reports she thinks she has thrush. Recently on antibiotic for strep throat

## 2021-12-13 DIAGNOSIS — I119 Hypertensive heart disease without heart failure: Secondary | ICD-10-CM | POA: Diagnosis not present

## 2021-12-13 DIAGNOSIS — I700 Atherosclerosis of aorta: Secondary | ICD-10-CM | POA: Diagnosis not present

## 2021-12-13 DIAGNOSIS — E782 Mixed hyperlipidemia: Secondary | ICD-10-CM | POA: Diagnosis not present

## 2021-12-13 DIAGNOSIS — I10 Essential (primary) hypertension: Secondary | ICD-10-CM | POA: Diagnosis not present
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
## 2021-12-18 ENCOUNTER — Encounter: Payer: Self-pay | Admitting: Pain Medicine

## 2021-12-18 ENCOUNTER — Ambulatory Visit: Payer: BC Managed Care – PPO | Admitting: Family Medicine

## 2021-12-18 ENCOUNTER — Ambulatory Visit: Payer: BC Managed Care – PPO | Attending: Pain Medicine | Admitting: Pain Medicine

## 2021-12-18 ENCOUNTER — Encounter: Payer: Self-pay | Admitting: Family Medicine

## 2021-12-18 VITALS — BP 104/90 | HR 97 | Temp 97.4°F | Ht 63.0 in | Wt 160.0 lb

## 2021-12-18 VITALS — BP 120/85 | HR 97 | Temp 98.2°F | Wt 160.1 lb

## 2021-12-18 DIAGNOSIS — R42 Dizziness and giddiness: Secondary | ICD-10-CM

## 2021-12-18 DIAGNOSIS — M545 Low back pain, unspecified: Secondary | ICD-10-CM | POA: Diagnosis not present

## 2021-12-18 DIAGNOSIS — M79604 Pain in right leg: Secondary | ICD-10-CM | POA: Diagnosis not present

## 2021-12-18 DIAGNOSIS — R410 Disorientation, unspecified: Secondary | ICD-10-CM

## 2021-12-18 DIAGNOSIS — M5136 Other intervertebral disc degeneration, lumbar region: Secondary | ICD-10-CM | POA: Diagnosis not present

## 2021-12-18 DIAGNOSIS — E876 Hypokalemia: Secondary | ICD-10-CM

## 2021-12-18 DIAGNOSIS — R202 Paresthesia of skin: Secondary | ICD-10-CM | POA: Insufficient documentation

## 2021-12-18 DIAGNOSIS — G8929 Other chronic pain: Secondary | ICD-10-CM | POA: Insufficient documentation

## 2021-12-18 DIAGNOSIS — M79605 Pain in left leg: Secondary | ICD-10-CM | POA: Insufficient documentation

## 2021-12-18 DIAGNOSIS — M4310 Spondylolisthesis, site unspecified: Secondary | ICD-10-CM | POA: Insufficient documentation

## 2021-12-18 DIAGNOSIS — R200 Anesthesia of skin: Secondary | ICD-10-CM | POA: Insufficient documentation

## 2021-12-18 DIAGNOSIS — M5417 Radiculopathy, lumbosacral region: Secondary | ICD-10-CM | POA: Insufficient documentation

## 2021-12-18 NOTE — Patient Instructions (Signed)
______________________________________________________________________  Preparing for Procedure with Sedation  NOTICE: Due to recent regulatory changes, starting on November 21, 2020, procedures requiring intravenous (IV) sedation will no longer be performed at the [facility].  These types of procedures are required to be performed at [facility] ambulatory surgery facility.  We are very sorry for the inconvenience.  Procedure appointments are limited to planned procedures: No Prescription Refills. No disability issues will be discussed. No medication changes will be discussed.  Instructions: Oral Intake: Do not eat or drink anything for at least 8 hours prior to your procedure. (Exception: Blood Pressure Medication. See below.) Transportation: A driver is required. You may not drive yourself after the procedure. Blood Pressure Medicine: Do not forget to take your blood pressure medicine with a sip of water the morning of the procedure. If your Diastolic (lower reading) is above 100 mmHg, elective cases will be cancelled/rescheduled. Blood thinners: These will need to be stopped for procedures. Notify our staff if you are taking any blood thinners. Depending on which one you take, there will be specific instructions on how and when to stop it. Diabetics on insulin: Notify the staff so that you can be scheduled 1st case in the morning. If your diabetes requires high dose insulin, take only  of your normal insulin dose the morning of the procedure and notify the staff that you have done so. Preventing infections: Shower with an antibacterial soap the morning of your procedure. Build-up your immune system: Take 1000 mg of Vitamin C with every meal (3 times a day) the day prior to your procedure. Antibiotics: Inform the staff if you have a condition or reason that requires you to take antibiotics before dental procedures. Pregnancy: If you are pregnant, call and cancel the procedure. Sickness: If  you have a cold, fever, or any active infections, call and cancel the procedure. Arrival: You must be in the facility at least 30 minutes prior to your scheduled procedure. Children: Do not bring children with you. Dress appropriately: There is always the possibility that your clothing may get soiled. Valuables: Do not bring any jewelry or valuables.  Reasons to call and reschedule or cancel your procedure: (Following these recommendations will minimize the risk of a serious complication.) Surgeries: Avoid having procedures within 2 weeks of any surgery. (Avoid for 2 weeks before or after any surgery). Flu Shots: Avoid having procedures within 2 weeks of a flu shots. (Avoid for 2 weeks before or after immunizations). Barium: Avoid having a procedure within 7-10 days after having had a radiological study involving the use of radiological contrast. (Myelograms, Barium swallow or enema study). Heart attacks: Avoid any elective procedures or surgeries for the initial 6 months after a "Myocardial Infarction" (Heart Attack). Blood thinners: It is imperative that you stop these medications before procedures. Let us know if you if you take any blood thinner.  Infection: Avoid procedures during or within two weeks of an infection (including chest colds or gastrointestinal problems). Symptoms associated with infections include: Localized redness, fever, chills, night sweats or profuse sweating, burning sensation when voiding, cough, congestion, stuffiness, runny nose, sore throat, diarrhea, nausea, vomiting, cold or Flu symptoms, recent or current infections. It is specially important if the infection is over the area that we intend to treat. Heart and lung problems: Symptoms that may suggest an active cardiopulmonary problem include: cough, chest pain, breathing difficulties or shortness of breath, dizziness, ankle swelling, uncontrolled high or unusually low blood pressure, and/or palpitations. If you are    experiencing any of these symptoms, cancel your procedure and contact your primary care physician for an evaluation.  Remember:  Regular Business hours are:  Monday to Thursday 8:00 AM to 4:00 PM  Provider's Schedule: Takao Lizer, MD:  Procedure days: Tuesday and Thursday 7:30 AM to 4:00 PM  Bilal Lateef, MD:  Procedure days: Monday and Wednesday 7:30 AM to 4:00 PM ______________________________________________________________________  ____________________________________________________________________________________________  General Risks and Possible Complications  Patient Responsibilities: It is important that you read this as it is part of your informed consent. It is our duty to inform you of the risks and possible complications associated with treatments offered to you. It is your responsibility as a patient to read this and to ask questions about anything that is not clear or that you believe was not covered in this document.  Patient's Rights: You have the right to refuse treatment. You also have the right to change your mind, even after initially having agreed to have the treatment done. However, under this last option, if you wait until the last second to change your mind, you may be charged for the materials used up to that point.  Introduction: Medicine is not an exact science. Everything in Medicine, including the lack of treatment(s), carries the potential for danger, harm, or loss (which is by definition: Risk). In Medicine, a complication is a secondary problem, condition, or disease that can aggravate an already existing one. All treatments carry the risk of possible complications. The fact that a side effects or complications occurs, does not imply that the treatment was conducted incorrectly. It must be clearly understood that these can happen even when everything is done following the highest safety standards.  No treatment: You can choose not to proceed with the  proposed treatment alternative. The "PRO(s)" would include: avoiding the risk of complications associated with the therapy. The "CON(s)" would include: not getting any of the treatment benefits. These benefits fall under one of three categories: diagnostic; therapeutic; and/or palliative. Diagnostic benefits include: getting information which can ultimately lead to improvement of the disease or symptom(s). Therapeutic benefits are those associated with the successful treatment of the disease. Finally, palliative benefits are those related to the decrease of the primary symptoms, without necessarily curing the condition (example: decreasing the pain from a flare-up of a chronic condition, such as incurable terminal cancer).  General Risks and Complications: These are associated to most interventional treatments. They can occur alone, or in combination. They fall under one of the following six (6) categories: no benefit or worsening of symptoms; bleeding; infection; nerve damage; allergic reactions; and/or death. No benefits or worsening of symptoms: In Medicine there are no guarantees, only probabilities. No healthcare provider can ever guarantee that a medical treatment will work, they can only state the probability that it may. Furthermore, there is always the possibility that the condition may worsen, either directly, or indirectly, as a consequence of the treatment. Bleeding: This is more common if the patient is taking a blood thinner, either prescription or over the counter (example: Goody Powders, Fish oil, Aspirin, Garlic, etc.), or if suffering a condition associated with impaired coagulation (example: Hemophilia, cirrhosis of the liver, low platelet counts, etc.). However, even if you do not have one on these, it can still happen. If you have any of these conditions, or take one of these drugs, make sure to notify your treating physician. Infection: This is more common in patients with a compromised  immune system, either due to disease (example:   diabetes, cancer, human immunodeficiency virus [HIV], etc.), or due to medications or treatments (example: therapies used to treat cancer and rheumatological diseases). However, even if you do not have one on these, it can still happen. If you have any of these conditions, or take one of these drugs, make sure to notify your treating physician. Nerve Damage: This is more common when the treatment is an invasive one, but it can also happen with the use of medications, such as those used in the treatment of cancer. The damage can occur to small secondary nerves, or to large primary ones, such as those in the spinal cord and brain. This damage may be temporary or permanent and it may lead to impairments that can range from temporary numbness to permanent paralysis and/or brain death. Allergic Reactions: Any time a substance or material comes in contact with our body, there is the possibility of an allergic reaction. These can range from a mild skin rash (contact dermatitis) to a severe systemic reaction (anaphylactic reaction), which can result in death. Death: In general, any medical intervention can result in death, most of the time due to an unforeseen complication. ____________________________________________________________________________________________  

## 2021-12-18 NOTE — Patient Instructions (Addendum)
Dr. Willeen Cass [address], [place], [place] [address] Phone: [phone]  Appt with Dr. Margaretmary Eddy Associate [facility] Wednesday 8/30 10AM [address] [place], [place] [address] Phone: [phone]

## 2021-12-18 NOTE — Progress Notes (Signed)
BP 120/85   Pulse 97   Temp 98.2 F (36.8 C)   Wt 160 lb 1.6 oz (72.6 kg)   BMI 29.28 kg/m    Subjective:    Patient ID: Lisa Galvan, female    DOB: 01-18-1961, 61 y.o.   MRN: [id number]  HPI: [name] is a 61 y.o. female  Chief Complaint  Patient presents with   Dizziness    Patient states she was seen in ED due to being confused and disoriented on 12/11/21. Patient was told she was dehydrated in ED. Today patient feels unsteady.    ER FOLLOW UP Time since discharge: 7 days Hospital/facility: [facility] Diagnosis: Dizziness, hypomagnesemia, hypokalemia Procedures/tests: EKG, labs, CT head- normal Consultants: None New medications: IV fluids Discharge instructions:  Follow up here Status: stable  Since getting out of the hospital, she has seen her cardiologist for a regular HTN follow up and nothing has changed.   She notes that the night before she went to the ER she was getting confused. She couldn't remember what she was looking for and was confused. She woke up in the AM and was still feeling really confused. She is still feeling disoriented. She is not driving. She is planning on quitting her job because she is not comfortable working anymore. She has been falling a lot and has not been doing well. She notes that she was diagnosed in the past with Parkinson's but that she doesn't believe that she has it and did not want to take any medication for this. She is concerned because things seem to be getting worse.    Relevant past medical, surgical, family and social history reviewed and updated as indicated. Interim medical history since our last visit reviewed. Allergies and medications reviewed and updated.  Review of Systems  Constitutional: Negative.   HENT: Negative.    Respiratory: Negative.    Cardiovascular: Negative.   Gastrointestinal: Negative.   Genitourinary: Negative.   Musculoskeletal: Negative.   Neurological:  Positive for dizziness, tremors, weakness,  light-headedness and numbness. Negative for seizures, syncope, facial asymmetry, speech difficulty and headaches.  Psychiatric/Behavioral: Negative.      Per HPI unless specifically indicated above     Objective:    BP 120/85   Pulse 97   Temp 98.2 F (36.8 C)   Wt 160 lb 1.6 oz (72.6 kg)   BMI 29.28 kg/m   Wt Readings from Last 3 Encounters:  12/18/21 160 lb 1.6 oz (72.6 kg)  12/11/21 162 lb (73.5 kg)  12/04/21 166 lb (75.3 kg)    Physical Exam Vitals and nursing note reviewed.  Constitutional:      General: She is not in acute distress.    Appearance: Normal appearance. She is not ill-appearing, toxic-appearing or diaphoretic.  HENT:     Head: Normocephalic and atraumatic.     Right Ear: External ear normal.     Left Ear: External ear normal.     Nose: Nose normal.     Mouth/Throat:     Mouth: Mucous membranes are moist.     Pharynx: Oropharynx is clear.  Eyes:     General: No scleral icterus.       Right eye: No discharge.        Left eye: No discharge.     Extraocular Movements: Extraocular movements intact.     Conjunctiva/sclera: Conjunctivae normal.     Pupils: Pupils are equal, round, and reactive to light.  Cardiovascular:     Rate and Rhythm:  Normal rate and regular rhythm.     Pulses: Normal pulses.     Heart sounds: Normal heart sounds. No murmur heard.    No friction rub. No gallop.  Pulmonary:     Effort: Pulmonary effort is normal. No respiratory distress.     Breath sounds: Normal breath sounds. No stridor. No wheezing, rhonchi or rales.  Chest:     Chest wall: No tenderness.  Musculoskeletal:        General: Normal range of motion.     Cervical back: Normal range of motion and neck supple.  Skin:    General: Skin is warm and dry.     Capillary Refill: Capillary refill takes less than 2 seconds.     Coloration: Skin is not jaundiced or pale.     Findings: No bruising, erythema, lesion or rash.  Neurological:     General: No focal deficit  present.     Mental Status: She is alert and oriented to person, place, and time. Mental status is at baseline.     Cranial Nerves: No cranial nerve deficit.     Sensory: Sensory deficit present.     Motor: No weakness.     Gait: Gait abnormal.  Psychiatric:        Mood and Affect: Mood normal.        Behavior: Behavior normal.        Thought Content: Thought content normal.        Judgment: Judgment normal.     Results for orders placed or performed during the hospital encounter of 12/11/21  Basic metabolic panel  Result Value Ref Range   Sodium 140 135 - 145 mmol/L   Potassium 3.1 (L) 3.5 - 5.1 mmol/L   Chloride 103 98 - 111 mmol/L   CO2 28 22 - 32 mmol/L   Glucose, Bld 135 (H) 70 - 99 mg/dL   BUN <5 (L) 8 - 23 mg/dL   Creatinine, Ser 1.95 0.44 - 1.00 mg/dL   Calcium 9.1 8.9 - 09.3 mg/dL   GFR, Estimated >26 >71 mL/min   Anion gap 9 5 - 15  CBC  Result Value Ref Range   WBC 3.3 (L) 4.0 - 10.5 K/uL   RBC 3.99 3.87 - 5.11 MIL/uL   Hemoglobin 12.9 12.0 - 15.0 g/dL   HCT 24.5 80.9 - 98.3 %   MCV 96.2 80.0 - 100.0 fL   MCH 32.3 26.0 - 34.0 pg   MCHC 33.6 30.0 - 36.0 g/dL   RDW 38.2 (H) 50.5 - 39.7 %   Platelets 228 150 - 400 K/uL   nRBC 0.0 0.0 - 0.2 %  Urinalysis, Routine w reflex microscopic  Result Value Ref Range   Color, Urine YELLOW (A) YELLOW   APPearance HAZY (A) CLEAR   Specific Gravity, Urine 1.011 1.005 - 1.030   pH 5.0 5.0 - 8.0   Glucose, UA NEGATIVE NEGATIVE mg/dL   Hgb urine dipstick NEGATIVE NEGATIVE   Bilirubin Urine NEGATIVE NEGATIVE   Ketones, ur NEGATIVE NEGATIVE mg/dL   Protein, ur NEGATIVE NEGATIVE mg/dL   Nitrite NEGATIVE NEGATIVE   Leukocytes,Ua SMALL (A) NEGATIVE   RBC / HPF 0-5 0 - 5 RBC/hpf   WBC, UA 0-5 0 - 5 WBC/hpf   Bacteria, UA RARE (A) NONE SEEN   Squamous Epithelial / LPF 0-5 0 - 5   Mucus PRESENT    Hyaline Casts, UA PRESENT   Magnesium  Result Value Ref Range   Magnesium 1.5 (L)  1.7 - 2.4 mg/dL      Assessment & Plan:    Problem List Items Addressed This Visit       Other   Hypomagnesemia    Rechecking labs today. Await results. Treat as needed.       Relevant Orders   Magnesium   Dizziness - Primary    Getting progressively worse with more falls and now confusion. Has had CTs which were negative. Will obtain MRI of the brain w and wo to r/o vestibular neuroma vs stroke. Will get her back into her neurologist- she notes that previously she was diagnosed with Parkinson's in the past but does not believe this and has not taken any medications for it. Discussed that several of her symptoms do correlate with a Parkinson's diagnosis. Will check her labs again today. Due to see ENT in the next couple of weeks.       Relevant Orders   MR Brain W Wo Contrast   Other Visit Diagnoses     Confusion       Acute onset last week- has gotten better, now more off balanced than confused. Will check MRI brain and get her back into see neurology. Call with concerns.    Relevant Orders   MR Brain W Wo Contrast   Hypokalemia       Rechecking labs today. Await results. Treat as needed.    Relevant Orders   Basic metabolic panel        Follow up plan: Return in about 4 weeks (around 01/15/2022).

## 2021-12-18 NOTE — Assessment & Plan Note (Signed)
Getting progressively worse with more falls and now confusion. Has had CTs which were negative. Will obtain MRI of the brain w and wo to r/o vestibular neuroma vs stroke. Will get her back into her neurologist- she notes that previously she was diagnosed with Parkinson's in the past but does not believe this and has not taken any medications for it. Discussed that several of her symptoms do correlate with a Parkinson's diagnosis. Will check her labs again today. Due to see ENT in the next couple of weeks.

## 2021-12-18 NOTE — Assessment & Plan Note (Signed)
Rechecking labs today. Await results. Treat as needed.  °

## 2021-12-18 NOTE — Progress Notes (Signed)
Safety precautions to be maintained throughout the outpatient stay will include: orient to surroundings, keep bed in low position, maintain call bell within reach at all times, provide assistance with transfer out of bed and ambulation.  

## 2021-12-19 LAB — MAGNESIUM: Magnesium: 1.5 mg/dL — ABNORMAL LOW (ref 1.6–2.3)

## 2021-12-19 LAB — BASIC METABOLIC PANEL
BUN/Creatinine Ratio: 5 — ABNORMAL LOW (ref 12–28)
BUN: 3 mg/dL — ABNORMAL LOW (ref 8–27)
CO2: 20 mmol/L (ref 20–29)
Calcium: 9.5 mg/dL (ref 8.7–10.3)
Chloride: 101 mmol/L (ref 96–106)
Creatinine, Ser: 0.63 mg/dL (ref 0.57–1.00)
Glucose: 124 mg/dL — ABNORMAL HIGH (ref 70–99)
Potassium: 3.1 mmol/L — ABNORMAL LOW (ref 3.5–5.2)
Sodium: 142 mmol/L (ref 134–144)
eGFR: 100 mL/min/{1.73_m2} (ref >59)

## 2021-12-20 DIAGNOSIS — G5603 Carpal tunnel syndrome, bilateral upper limbs: Secondary | ICD-10-CM | POA: Diagnosis not present

## 2021-12-20 DIAGNOSIS — R296 Repeated falls: Secondary | ICD-10-CM | POA: Diagnosis not present

## 2021-12-20 DIAGNOSIS — R42 Dizziness and giddiness: Secondary | ICD-10-CM | POA: Diagnosis not present

## 2021-12-20 DIAGNOSIS — G63 Polyneuropathy in diseases classified elsewhere: Secondary | ICD-10-CM | POA: Diagnosis not present

## 2021-12-21 ENCOUNTER — Ambulatory Visit: Payer: Self-pay

## 2021-12-21 ENCOUNTER — Telehealth: Payer: Self-pay

## 2021-12-21 NOTE — Telephone Encounter (Signed)
Copied from CRM [id number]. Topic: General - Other >> Dec 21, 2021  1:36 PM Lisa Galvan wrote: Pt is requesting a cb form [place]  Please assist further

## 2021-12-21 NOTE — Patient Outreach (Signed)
  Care Coordination   Follow Up Visit Note   12/21/2021 Name: Lisa Galvan MRN: [id number] DOB: 04-08-1961  Lisa Galvan is a 61 y.o. year old female who sees Lisa Carrow, DO for primary care. I spoke with  Lisa Galvan by phone today.  What matters to the patients health and wellness today?  Preventing falls and getting help in the home    Goals Addressed             This Visit's Progress    RNCM: Fall prevention and Safety       Care Coordination Interventions: Provided written and verbal education re: potential causes of falls and Fall prevention strategies Reviewed medications and discussed potential side effects of medications such as dizziness and frequent urination Advised patient of importance of notifying provider of falls Assessed for signs and symptoms of orthostatic hypotension Assessed for falls since last encounter. Last fall was on 12-11-2021 Assessed patients knowledge of fall risk prevention secondary to previously provided education. The patient has seen the neurologist and states that she did not get a lot of answers. Is supposed to have an MRI on 12-26-2021 and hope this will give answers to why she is having acute onset of weakness in legs and her legs give away.  Provided patient information for fall alert systems. Review and patient has a life alert system. She has not set it up yet as she just received. Encouraged her to set up the system, especially since she lives alone. Assessed working status of life alert bracelet and patient adherence Advised patient to discuss fall prevention and safety concerns with provider Screening for signs and symptoms of depression related to chronic disease state  Assessed social determinant of health barriers Social worker referral for assistance with resource to help in the home. The patient ask about "self sufficient program". Collaboration with LCSW and appointment made for the patient to talk with LCSW on 12-22-2021 at 1  pm Patient interviewed about adult health maintenance status including  Falls risk assessment     Active listening / Reflection utilized  Emotional Support Provided         SDOH assessments and interventions completed:  Yes  SDOH Interventions Today    Flowsheet Row Most Recent Value  SDOH Interventions   Food Insecurity Interventions Intervention Not Indicated  Housing Interventions Intervention Not Indicated  Stress Interventions Other (Comment)  [changes in health and stressed out, falls]  Social Connections Interventions Other (Comment)  [has good support from daughter and granddaughter]  Transportation Interventions Intervention Not Indicated        Care Coordination Interventions Activated:  Yes  Care Coordination Interventions:  Yes, provided   Follow up plan: Follow up call scheduled for 01-30-2022 at 1 pm    Encounter Outcome:  Pt. Visit Completed   Lisa [place] RN, MSN, CCM Community Care Coordinator [facility]  [facility] Mobile: [phone]

## 2021-12-21 NOTE — Patient Instructions (Signed)
Visit Information  Thank you for taking time to visit with me today. Please don't hesitate to contact me if I can be of assistance to you.   Following are the goals we discussed today:   Goals Addressed             This Visit's Progress    RNCM: Fall prevention and Safety       Care Coordination Interventions: Provided written and verbal education re: potential causes of falls and Fall prevention strategies Reviewed medications and discussed potential side effects of medications such as dizziness and frequent urination Advised patient of importance of notifying provider of falls Assessed for signs and symptoms of orthostatic hypotension Assessed for falls since last encounter. Last fall was on 12-11-2021 Assessed patients knowledge of fall risk prevention secondary to previously provided education. The patient has seen the neurologist and states that she did not get a lot of answers. Is supposed to have an MRI on 12-26-2021 and hope this will give answers to why she is having acute onset of weakness in legs and her legs give away.  Provided patient information for fall alert systems. Review and patient has a life alert system. She has not set it up yet as she just received. Encouraged her to set up the system, especially since she lives alone. Assessed working status of life alert bracelet and patient adherence Advised patient to discuss fall prevention and safety concerns with provider Screening for signs and symptoms of depression related to chronic disease state  Assessed social determinant of health barriers Social worker referral for assistance with resource to help in the home. The patient ask about "self sufficient program". Collaboration with LCSW and appointment made for the patient to talk with LCSW on 12-22-2021 at 1 pm Patient interviewed about adult health maintenance status including  Falls risk assessment     Active listening / Reflection utilized  Emotional Support Provided          Our next appointment is by telephone on 01-30-2022 at 1 pm  Please call the care guide team at [phone] if you need to cancel or reschedule your appointment.   If you are experiencing a Mental Health or Behavioral Health Crisis or need someone to talk to, please call the Suicide and Crisis Lifeline: 988 call the [place] National Suicide Prevention Lifeline: 902 487 4258 or TTY: 928-040-6011 TTY 4426752693) to talk to a trained counselor call 1-800-273-TALK (toll free, 24 hour hotline)  Patient verbalizes understanding of instructions and care plan provided today and agrees to view in MyChart. Active MyChart status and patient understanding of how to access instructions and care plan via MyChart confirmed with patient.     Telephone follow up appointment with care management team member scheduled for: 01-30-2022 at 1 pm  [place] RN, MSN, CCM [facility] Coordinator [facility]  [facility] Mobile: [phone]

## 2021-12-22 ENCOUNTER — Telehealth: Payer: Self-pay | Admitting: *Deleted

## 2021-12-22 ENCOUNTER — Ambulatory Visit: Payer: Self-pay | Admitting: *Deleted

## 2021-12-22 NOTE — Patient Instructions (Signed)
Visit Information  Thank you for taking time to visit with me today. Please don't hesitate to contact me if I can be of assistance to you.   Following are the goals we discussed today:   Goals Addressed             This Visit's Progress    Resources to assist with housekeeping       Care Coordination Interventions: Phone call to patient who requested resources for in home care Medicaid eligibility discussed as well as consideration of private duty care and cost involved In home care resources  mailed to patient's home as well as resources for the [facility] Caregiver Support Program through the [facility] [employer] Agency on Aging Applying for disability also discussed , patient currently receiving social security and will discuss this option with them with regard to how it will affect her income          Our next appointment is by telephone on 01/02/22 at 1pm  Please call the care guide team at [phone] if you need to cancel or reschedule your appointment.   If you are experiencing a Mental Health or Behavioral Health Crisis or need someone to talk to, please call the Suicide and Crisis Lifeline: 988   Patient verbalizes understanding of instructions and care plan provided today and agrees to view in MyChart. Active MyChart status and patient understanding of how to access instructions and care plan via MyChart confirmed with patient.     Telephone follow up appointment with care management team member scheduled for: 01/02/22 1pm  Treyvion Durkee, [place] Clinical Social Worker  [facility] Care Management [phone]

## 2021-12-22 NOTE — Patient Outreach (Signed)
  Care Coordination   Initial Visit Note   12/22/2021 Name: Sakoya Win MRN: [id number] DOB: [birth year]  Teri Legacy is a 61 y.o. year old female who sees Dorcas Carrow, DO for primary care. I spoke with  [name] by phone today.  What matters to the patients health and wellness today?  In home care resources    Goals Addressed             This Visit's Progress    Resources to assist with housekeeping       Care Coordination Interventions: Phone call to patient who requested resources for in home care Medicaid eligibility discussed as well as consideration of private duty care and cost involved In home care resources  mailed to patient's home as well as resources for the [facility] Caregiver Support Program through the [employer] [facility] [employer] Agency on Aging Applying for disability also discussed , patient currently receiving social security and will discuss this option with them with regard to how it will affect her income          SDOH assessments and interventions completed:  Yes     Care Coordination Interventions Activated:  Yes  Care Coordination Interventions:  Yes, provided   Follow up plan: Follow up call scheduled for 01/02/22    Encounter Outcome:  Pt. Scheduled

## 2021-12-22 NOTE — Patient Outreach (Signed)
  Care Coordination   12/22/2021 Name: Lisa Galvan MRN: [id number] DOB: Feb 25, 1961   Care Coordination Outreach Attempts:  An unsuccessful telephone outreach was attempted today to offer the patient information about available care coordination services as a benefit of their health plan.   Follow Up Plan:  Additional outreach attempts will be made to offer the patient care coordination information and services.   Encounter Outcome:  No Answer  Care Coordination Interventions Activated:  No   Care Coordination Interventions:  No, not indicated    Lace Chenevert, LCSW Clinical Social Worker  [facility] Care Management [phone]

## 2021-12-25 ENCOUNTER — Other Ambulatory Visit: Payer: Self-pay | Admitting: Family Medicine

## 2021-12-26 DIAGNOSIS — G5603 Carpal tunnel syndrome, bilateral upper limbs: Secondary | ICD-10-CM | POA: Diagnosis not present

## 2021-12-27 ENCOUNTER — Ambulatory Visit
Admission: RE | Admit: 2021-12-27 | Discharge: 2021-12-27 | Disposition: A | Discharge status: Home / Self Care | Payer: BC Managed Care – PPO | Source: Ambulatory Visit | Attending: Family Medicine | Admitting: Family Medicine

## 2021-12-27 DIAGNOSIS — R42 Dizziness and giddiness: Secondary | ICD-10-CM | POA: Diagnosis not present

## 2021-12-27 DIAGNOSIS — R410 Disorientation, unspecified: Secondary | ICD-10-CM | POA: Diagnosis not present

## 2021-12-27 DIAGNOSIS — R4182 Altered mental status, unspecified: Secondary | ICD-10-CM | POA: Diagnosis not present

## 2021-12-27 MED ORDER — GADOBUTROL 1 MMOL/ML IV SOLN
7.0000 mL | Freq: Once | INTRAVENOUS | Status: AC | PRN
  Administered 2021-12-27: 7 mL via INTRAVENOUS

## 2021-12-27 NOTE — Telephone Encounter (Signed)
Refilled 12/04/2021 #270 1 refill - confirmed by same pharmacy. Requested Prescriptions  Pending Prescriptions Disp Refills  . gabapentin (NEURONTIN) 300 MG capsule [Pharmacy Med Name: GABAPENTIN 300MG  CAPSULES] 270 capsule 1    Sig: TAKE 1 CAPSULE(300 MG) BY MOUTH THREE TIMES DAILY     Neurology: Anticonvulsants - gabapentin Passed - 12/25/2021  4:10 PM      Passed - Cr in normal range and within 360 days    Creatinine, Ser  Date Value Ref Range Status  12/18/2021 0.63 0.57 - 1.00 mg/dL Final         Passed - Completed PHQ-2 or PHQ-9 in the last 360 days      Passed - Valid encounter within last 12 months    Recent Outpatient Visits          1 week ago Dizziness   [facility] [place], Megan P, DO   3 weeks ago Essential hypertension   [facility] [place], Megan P, DO   1 month ago Strep throat   [facility] [place], Megan P, DO   4 months ago OSA (obstructive sleep apnea)   [facility], Megan P, DO   6 months ago Type 2 diabetes mellitus with diabetic neuropathy, without long-term current use of insulin [facility])   [facility] [place], [place], DO      Future Appointments            In 2 weeks [facility], Laural Benes, DO [facility] Rud, [facility]   In 2 months [employer], Laural Benes, DO Oralia Rud, [facility]

## 2022-01-01 ENCOUNTER — Telehealth: Payer: Self-pay | Admitting: Family Medicine

## 2022-01-01 DIAGNOSIS — H903 Sensorineural hearing loss, bilateral: Secondary | ICD-10-CM | POA: Diagnosis not present

## 2022-01-01 DIAGNOSIS — R1314 Dysphagia, pharyngoesophageal phase: Secondary | ICD-10-CM | POA: Diagnosis not present

## 2022-01-01 NOTE — Telephone Encounter (Signed)
Pt is calling to report that her medication are very expensive. Is there a coupon or anything else Dr. Laural Benes can do to adjust the price of the medications.  potassium chloride (KLOR-CON M) 10 MEQ tablet [[id number]] - $55 omeprazole (PRILOSEC) 40 MG capsule [[id number]] $40 DULoxetine (CYMBALTA) 60 MG capsule [[id number]] $75 DULoxetine (CYMBALTA) 30 MG capsule [[id number]] $70  Please advise CB- [phone]

## 2022-01-02 ENCOUNTER — Ambulatory Visit: Payer: Self-pay | Admitting: *Deleted

## 2022-01-02 DIAGNOSIS — M79604 Pain in right leg: Secondary | ICD-10-CM

## 2022-01-02 NOTE — Patient Outreach (Signed)
  Care Coordination   Follow Up Visit Note   01/02/2022 Name: Lisa Galvan MRN: [id number] DOB: 1960/05/23  Lisa Galvan is a 61 y.o. year old female who sees Dorcas Carrow, DO for primary care. I spoke with  Lisa Galvan by phone today.  What matters to the patients health and wellness today?  In home care needs    Goals Addressed             This Visit's Progress    Resources to assist with housekeeping       Care Coordination Interventions: Phone call to patient to follow up on requested resources for in home care Patient currently working out her notice at work due to limits in her mobility Medicaid eligibility discussed as well as consideration of private duty care and cost involved In home care resources  mailed to patient's home as well as resources for the [facility] Caregiver Support Program through the [employer] [facility] Agency on Aging-per patient she received the information and will review Patient currently receiving social security and will discuss the option of applying for disability  Self care plan: joined gym, scheduled massage, spending time with granddaughter and will follow up with [place] [facility] [employer] on Aging regarding caregiver support program Referral made to the [facility] team for assistance with medication costs          SDOH assessments and interventions completed:  Yes  SDOH Interventions Today    Flowsheet Row Most Recent Value  SDOH Interventions   Financial Strain Interventions [id number] Referral        Care Coordination Interventions Activated:  Yes  Care Coordination Interventions:  Yes, provided   Follow up plan: Follow up call scheduled for 01/16/22    Encounter Outcome:  Pt. Visit Completed

## 2022-01-02 NOTE — Patient Instructions (Signed)
Visit Information  Thank you for taking time to visit with me today. Please don't hesitate to contact me if I can be of assistance to you.   Following are the goals we discussed today:   Goals Addressed             This Visit's Progress    Resources to assist with housekeeping       Care Coordination Interventions: Phone call to patient to follow up on requested resources for in home care Patient currently working out her notice at work due to limits in her mobility Medicaid eligibility discussed as well as consideration of private duty care and cost involved In home care resources  mailed to patient's home as well as resources for the [facility] Caregiver Support Program through the [employer] [facility] [employer] Agency on Aging-per patient she received the information and will review Patient currently receiving social security and will discuss the option of applying for disability  Self care plan: joined gym, scheduled massage, spending time with granddaughter and will follow up with [place] [facility] [employer] Agency on Aging regarding caregiver support program Referral made to the [facility] team for assistance with medication costs          Our next appointment is by telephone on 01/16/22 at 1:30pm  Please call the care guide team at [phone] if you need to cancel or reschedule your appointment.   If you are experiencing a Mental Health or Behavioral Health Crisis or need someone to talk to, please call the Suicide and Crisis Lifeline: 988   Patient verbalizes understanding of instructions and care plan provided today and agrees to view in MyChart. Active MyChart status and patient understanding of how to access instructions and care plan via MyChart confirmed with patient.     Telephone follow up appointment with care management team member scheduled for:01/16/22  Verna Czech, [place] Clinical Social Worker  [facility] Care Management [phone]

## 2022-01-03 ENCOUNTER — Telehealth: Payer: Self-pay

## 2022-01-03 DIAGNOSIS — Z596 Low income: Secondary | ICD-10-CM

## 2022-01-03 NOTE — Progress Notes (Addendum)
[facility])  [facility] Quality Pharmacy Team    01/03/2022  Lisa Galvan 08-13-1960 [id number]  Reason for referral: Medication Assistance with Duloxetine (30 mg and 60 mg), KCL, and Omeprazole  Referral source: [facility] Social Worker Current insurance: [employer] - Commercial  [employer] received a referral for medication assistance with Duloxetine, KCL, and Omeprazole. Patient may have a high deductible plan as noted by the pharmacy but patient denies high deductible plan. It was suggested to call BCBS to determine deductible/benefits but patient did not express interest. Per [facility], they have been billing most of her medications through GoodRx discount coupon. She has not applied for Medicaid/disability yet.   Given patient has Nurse, learning disability, she is not eligible for any medication assistance programs through the manufacturer. Additionally, her medications are generic and manufacturer coupon is not available. However, she meets the criteria for [facility] to receive discounted prescriptions through their pharmacy. Patient will need to continue to pay for medications with [facility] using GoodRx until her prescriptions are transferred or filled with [facility] affiliated pharmacy. Completed online preliminary application and a representative with [facility] will contact patient. Explained to patient moving forward with new (generic) medications to check [facility] medication list for eligible affordable medications, patient report understanding and explained she previously used [facility] for another medication.  Plan: Will follow-up in 3-5 business days regarding [facility] status.  Cephus Shelling, PharmD Clinical Pharmacist [facility] Cell: [phone]

## 2022-01-05 DIAGNOSIS — G4733 Obstructive sleep apnea (adult) (pediatric): Secondary | ICD-10-CM | POA: Diagnosis not present

## 2022-01-08 ENCOUNTER — Other Ambulatory Visit: Payer: Self-pay | Admitting: Otolaryngology

## 2022-01-08 DIAGNOSIS — R1310 Dysphagia, unspecified: Secondary | ICD-10-CM

## 2022-01-09 NOTE — Progress Notes (Signed)
[facility])  [place] Team    01/09/2022  Sedona Wenk 08/05/1960 [id number]  Reason for referral: Medication Assistance with Duloxetine (30 mg and 60 mg), KCL, and Omeprazole  Referral source: [facility] Social Worker Current insurance: [employer] - Commercial  Patient has not heard back from [employer]. I called them with pt on the phone to finish enrollment and was able to get it completed. Reminded patient that if she doesn't hear back in a few days, please reach out to PCP. Confirmed with patient she would like duloxetine 30 m and 60 mg, omeprazole, and KCL to be sent to [facility] and she confirmed. She also denied needin help with any other medications. Explained to patient what do moving forward should she need additional medications filled with [facility].   Plan: Will route note to Dr. Wynetta Emery regarding prescription transfer to [facility].  Will close [facility] pharmacy case as no further medication needs identified at this time.  Am happy to assist in the future as needed.    Kristeen Miss, PharmD Clinical Pharmacist [place] Cell: [phone]

## 2022-01-15 ENCOUNTER — Ambulatory Visit: Payer: BC Managed Care – PPO | Admitting: Family Medicine

## 2022-01-15 VITALS — BP 121/90 | HR 109 | Temp 98.2°F | Wt 153.8 lb

## 2022-01-15 DIAGNOSIS — Z23 Encounter for immunization: Secondary | ICD-10-CM

## 2022-01-15 DIAGNOSIS — E876 Hypokalemia: Secondary | ICD-10-CM

## 2022-01-15 DIAGNOSIS — M792 Neuralgia and neuritis, unspecified: Secondary | ICD-10-CM

## 2022-01-15 DIAGNOSIS — R5382 Chronic fatigue, unspecified: Secondary | ICD-10-CM | POA: Diagnosis not present

## 2022-01-15 MED ORDER — DULOXETINE HCL 60 MG PO CPEP: 60.0000 mg | ORAL_CAPSULE | Freq: Every day | ORAL | 1 refills | Status: DC

## 2022-01-15 MED ORDER — GABAPENTIN 300 MG PO CAPS: ORAL_CAPSULE | ORAL | 1 refills | Status: DC

## 2022-01-15 MED ORDER — POTASSIUM CHLORIDE CRYS ER 10 MEQ PO TBCR: EXTENDED_RELEASE_TABLET | ORAL | 1 refills | Status: DC

## 2022-01-15 MED ORDER — DULOXETINE HCL 30 MG PO CPEP: 30.0000 mg | ORAL_CAPSULE | Freq: Every day | ORAL | 1 refills | Status: DC

## 2022-01-15 MED ORDER — OMEPRAZOLE 40 MG PO CPDR: DELAYED_RELEASE_CAPSULE | ORAL | 1 refills | Status: DC

## 2022-01-15 NOTE — Progress Notes (Signed)
BP (!) 121/90   Pulse (!) 109   Temp 98.2 F (36.8 C)   Wt 153 lb 12.8 oz (69.8 kg)   SpO2 98%   BMI 27.24 kg/m    Subjective:    Patient ID: Lisa Galvan, female    DOB: [birth year], 61 y.o.   MRN: [id number]  HPI: [name] is a 61 y.o. female  Chief Complaint  Patient presents with  . Dizziness    Patient states her dizziness is getting a little better. Worsens with bending over.    FATIGUE- has not been using her CPAP  Duration:  chronic Severity: severe  Onset: gradual Context when symptoms started:  unknown Symptoms improve with rest: no  Depressive symptoms: no Stress/anxiety: no Insomnia: no  Snoring: yes Observed apnea by bed partner: yes Daytime hypersomnolence:yes Wakes feeling refreshed: no History of sleep study: yes Dysnea on exertion:  yes Orthopnea/PND: no Chest pain: no Chronic cough: no Lower extremity edema: no Arthralgias:yes Myalgias: yes Weakness: yes Rash: no   Relevant past medical, surgical, family and social history reviewed and updated as indicated. Interim medical history since our last visit reviewed. Allergies and medications reviewed and updated.  Review of Systems  Constitutional:  Positive for fatigue. Negative for activity change, appetite change, chills, diaphoresis, fever and unexpected weight change.  Respiratory: Negative.    Cardiovascular: Negative.   Gastrointestinal: Negative.   Musculoskeletal: Negative.   Neurological: Negative.   Psychiatric/Behavioral: Negative.     Per HPI unless specifically indicated above     Objective:    BP (!) 121/90   Pulse (!) 109   Temp 98.2 F (36.8 C)   Wt 153 lb 12.8 oz (69.8 kg)   SpO2 98%   BMI 27.24 kg/m   Wt Readings from Last 3 Encounters:  01/15/22 153 lb 12.8 oz (69.8 kg)  12/18/21 160 lb (72.6 kg)  12/18/21 160 lb 1.6 oz (72.6 kg)    Physical Exam Vitals and nursing note reviewed.  Constitutional:      General: She is not in acute distress.     Appearance: Normal appearance. She is normal weight. She is not ill-appearing, toxic-appearing or diaphoretic.  HENT:     Head: Normocephalic and atraumatic.     Right Ear: External ear normal.     Left Ear: External ear normal.     Nose: Nose normal.     Mouth/Throat:     Mouth: Mucous membranes are moist.     Pharynx: Oropharynx is clear.  Eyes:     General: No scleral icterus.       Right eye: No discharge.        Left eye: No discharge.     Extraocular Movements: Extraocular movements intact.     Conjunctiva/sclera: Conjunctivae normal.     Pupils: Pupils are equal, round, and reactive to light.  Cardiovascular:     Rate and Rhythm: Normal rate and regular rhythm.     Pulses: Normal pulses.     Heart sounds: Normal heart sounds. No murmur heard.    No friction rub. No gallop.  Pulmonary:     Effort: Pulmonary effort is normal. No respiratory distress.     Breath sounds: Normal breath sounds. No stridor. No wheezing, rhonchi or rales.  Chest:     Chest wall: No tenderness.  Musculoskeletal:        General: Normal range of motion.     Cervical back: Normal range of motion and neck supple.  Skin:  General: Skin is warm and dry.     Capillary Refill: Capillary refill takes less than 2 seconds.     Coloration: Skin is not jaundiced or pale.     Findings: No bruising, erythema, lesion or rash.  Neurological:     General: No focal deficit present.     Mental Status: She is alert and oriented to person, place, and time. Mental status is at baseline.  Psychiatric:        Mood and Affect: Mood normal.        Behavior: Behavior normal.        Thought Content: Thought content normal.        Judgment: Judgment normal.    Results for orders placed or performed in visit on 67/20/94  Basic metabolic panel  Result Value Ref Range   Glucose 124 (H) 70 - 99 mg/dL   BUN 3 (L) 8 - 27 mg/dL   Creatinine, Ser 0.63 0.57 - 1.00 mg/dL   eGFR 100 >59 mL/min/1.73   BUN/Creatinine Ratio 5  (L) 12 - 28   Sodium 142 134 - 144 mmol/L   Potassium 3.1 (L) 3.5 - 5.2 mmol/L   Chloride 101 96 - 106 mmol/L   CO2 20 20 - 29 mmol/L   Calcium 9.5 8.7 - 10.3 mg/dL  Magnesium  Result Value Ref Range   Magnesium 1.5 (L) 1.6 - 2.3 mg/dL      Assessment & Plan:   Problem List Items Addressed This Visit   None Visit Diagnoses     Need for influenza vaccination    -  Primary   Relevant Orders   Flu Vaccine QUAD 6+ mos PF IM (Fluarix Quad PF)        Follow up plan: No follow-ups on file.

## 2022-01-16 ENCOUNTER — Ambulatory Visit: Payer: Self-pay | Admitting: *Deleted

## 2022-01-16 NOTE — Patient Outreach (Signed)
  Care Coordination   Follow Up Visit Note   01/16/2022 Name: Lisa Galvan MRN: [id number] DOB: [birth year]  Lisa Galvan is a 61 y.o. year old female who sees [name], DO for primary care. I spoke with  Lisa Galvan by phone today.  What matters to the patients health and wellness today?  In home care resources, medication assistance    Goals Addressed             This Visit's Progress    Resources to assist with housekeeping       Care Coordination Interventions: Phone call to patient to follow up on requested resources for in home care Patient states that she is doing well, recently stopped working and will likely apply for disability  In home care resources  previously mailed to patient's home as well as resources for the [facility] Caregiver Support Program through the [place] on Aging-per patient her daughter has contacted this program, however patient would need to be on disability in order to receive assistance from them Self care plan: joined gym, scheduled massage, spending time with granddaughter  Referral previously made to the [facility] team for assistance with medication costs, she is currently receiving medication assistance from this team No additional community resource needs identified at this time Patient encouraged to to contact this Education officer, museum with any additional community resource needs          SDOH assessments and interventions completed:  No     Care Coordination Interventions Activated:  Yes  Care Coordination Interventions:  Yes, provided   Follow up plan: No further intervention required.   Encounter Outcome:  Pt. Visit Completed

## 2022-01-16 NOTE — Patient Instructions (Addendum)
Visit Information  Thank you for taking time to visit with me today. Please don't hesitate to contact me if I can be of assistance to you.   Following are the goals we discussed today:   Goals Addressed             This Visit's Progress    Resources to assist with housekeeping       Care Coordination Interventions: Phone call to patient to follow up on requested resources for in home care Patient states that she is doing well, recently stopped working and will likely apply for disability  In home care resources  previously mailed to patient's home as well as resources for the [facility] Caregiver Support Program through the [place] on Aging-per patient her daughter has contacted this program, however patient would need to be on disability in order to receive assistance from them Self care plan: joined gym, scheduled massage, spending time with granddaughter  Referral previously made to the [facility] team for assistance with medication costs, she is currently receiving medication assistance from this team No additional community resource needs identified at this time Patient encouraged to to contact this Education officer, museum with any additional community resource needs          If you are experiencing a Mental Health or [place] or need someone to talk to, please call the Suicide and Crisis Lifeline: 988 call 911   Patient verbalizes understanding of instructions and care plan provided today and agrees to view in [place]. Active MyChart status and patient understanding of how to access instructions and care plan via MyChart confirmed with patient.     No further follow up required: patient to contact this Education officer, museum with any additional community resource needs    [employer], [place] Worker  [facility] Care Management [phone]

## 2022-01-17 ENCOUNTER — Encounter: Payer: Self-pay | Admitting: Family Medicine

## 2022-01-17 LAB — HM DIABETES EYE EXAM

## 2022-01-17 NOTE — Assessment & Plan Note (Signed)
Rechecking labs today. Await results. Treat as needed.  °

## 2022-01-17 NOTE — Assessment & Plan Note (Signed)
Due for refill on her cymbalta. Rx sent over today.

## 2022-01-18 LAB — BASIC METABOLIC PANEL
BUN/Creatinine Ratio: 12 (ref 12–28)
BUN: 10 mg/dL (ref 8–27)
CO2: 24 mmol/L (ref 20–29)
Calcium: 10.1 mg/dL (ref 8.7–10.3)
Chloride: 98 mmol/L (ref 96–106)
Creatinine, Ser: 0.86 mg/dL (ref 0.57–1.00)
Glucose: 97 mg/dL (ref 70–99)
Potassium: 3.9 mmol/L (ref 3.5–5.2)
Sodium: 137 mmol/L (ref 134–144)
eGFR: 76 mL/min/{1.73_m2} (ref >59)

## 2022-01-18 LAB — BABESIA MICROTI ANTIBODY PANEL
Babesia microti IgG: <1:10 {titer}
Babesia microti IgM: <1:10 {titer}

## 2022-01-18 LAB — EHRLICHIA ANTIBODY PANEL
E. Chaffeensis (HME) IgM Titer: NEGATIVE
E.Chaffeensis (HME) IgG: NEGATIVE
HGE IgG Titer: NEGATIVE
HGE IgM Titer: NEGATIVE

## 2022-01-18 LAB — LYME DISEASE SEROLOGY W/REFLEX: Lyme Total Antibody EIA: NEGATIVE

## 2022-01-18 LAB — ROCKY MTN SPOTTED FVR ABS PNL(IGG+IGM)
RMSF IgG: NEGATIVE
RMSF IgM: 0.4 index (ref 0.00–0.89)

## 2022-01-18 LAB — TSH: TSH: 1.47 u[IU]/mL (ref 0.450–4.500)

## 2022-01-18 LAB — MAGNESIUM: Magnesium: 1.4 mg/dL — ABNORMAL LOW (ref 1.6–2.3)

## 2022-01-29 ENCOUNTER — Telehealth: Payer: Self-pay | Admitting: Family Medicine

## 2022-01-29 NOTE — Telephone Encounter (Signed)
Pt needs to know if her lab results are ok and if Dr. Dossie Arbour can continue the shots in her back / needed labs to be ok to do so / please advise   Pt also needs a note from Dr. Wynetta Emery stating that the pt is no longer able to work due to her health/ please advise when ready to pick up or place in her mychart

## 2022-01-29 NOTE — Telephone Encounter (Signed)
Please advise. 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

## 2022-01-29 NOTE — Telephone Encounter (Addendum)
We do not provide notes for long term disability. That will need to come from her specialist.   Results should have been on her mychart 2 weeks ago and it looks like she saw them on 10/1

## 2022-01-30 ENCOUNTER — Ambulatory Visit: Payer: Self-pay

## 2022-01-30 NOTE — Patient Outreach (Signed)
  Care Coordination   Follow Up Visit Note   01/30/2022 Name: Lisa Galvan MRN: [id number] DOB: 1960-07-16  Lisa Galvan is a 61 y.o. year old female who sees Lisa Roys, DO for primary care. I spoke with  Lisa Galvan by phone today.  What matters to the patients health and wellness today?  Fatigue, swallowing issues, and cpap needs    Goals Addressed             This Visit's Progress    RNCM: Effective Management of Fatique       Care Coordination Interventions: Evaluation of current treatment plan related to fatigue and being tired, also swallowing difficulty and patient's adherence to plan as established by provider Advised patient to keep appointment with radiology for swallowing evaluation Provided education to patient re: resources available, review of food banks and the patient has plans to go to food bank tomorrow locally Reviewed medications with patient and discussed compliance Provided patient with swallowing study information/ educational materials related to the patient getting a swallowing evaluation on the 12th Reviewed scheduled/upcoming provider appointments including 03-06-2022 at 9 am Social Work referral for ongoing support and education Discussed plans with patient for ongoing care management follow up and provided patient with direct contact information for care management team Advised patient to discuss cpap use, swallowing difficulties, fatigue and changes in health and well being with provider Screening for signs and symptoms of depression related to chronic disease state  Assessed social determinant of health barriers        RNCM: Fall prevention and Safety       Care Coordination Interventions: Provided written and verbal education re: potential causes of falls and Fall prevention strategies Reviewed medications and discussed potential side effects of medications such as dizziness and frequent urination Advised patient of importance of notifying  provider of falls Assessed for signs and symptoms of orthostatic hypotension Assessed for falls since last encounter. Last fall was on 12-11-2021. No new falls.  Assessed patients knowledge of fall risk prevention secondary to previously provided education. Review with patient to monitor for risk factors and fall risk.  Provided patient information for fall alert systems. Review and patient has a life alert system. She has not set it up yet as she just received. Encouraged her to set up the system, especially since she lives alone. Assessed working status of life alert bracelet and patient adherence Advised patient to discuss fall prevention and safety concerns with provider Screening for signs and symptoms of depression related to chronic disease state  Assessed social determinant of health barriers Social worker referral for assistance with resource to help in the home. Working with the [employer] Patient interviewed about adult health maintenance status including  Falls risk assessment     Active listening / Reflection utilized  Engineer, petroleum Provided         SDOH assessments and interventions completed:  Yes  SDOH Interventions Today    Flowsheet Row Most Recent Value  SDOH Interventions   Utilities Interventions Intervention Not Indicated        Care Coordination Interventions Activated:  Yes  Care Coordination Interventions:  Yes, provided   Follow up plan: Follow up call scheduled for 04-03-2022 at 130 pm    Encounter Outcome:  Pt. Visit Completed   Lisa Larsson RN, MSN, [place]  Mobile: [phone]

## 2022-01-30 NOTE — Telephone Encounter (Signed)
Patient called back- and notified of provider response.  Patient states her main concern is :  Does PCP feel she can continue injections in her back. Please let her know. Patient states she saw labs- but is not sure PCP feels it is ok for her to continue injections

## 2022-01-30 NOTE — Telephone Encounter (Signed)
Attempted to contact patient to relay provider advise, NA LVM for patient to call back.   OK for PEC to relay provider message if patient calls back.

## 2022-01-30 NOTE — Patient Instructions (Signed)
Visit Information  Thank you for taking time to visit with me today. Please don't hesitate to contact me if I can be of assistance to you.   Following are the goals we discussed today:   Goals Addressed             This Visit's Progress    RNCM: Effective Management of Fatique       Care Coordination Interventions: Evaluation of current treatment plan related to fatigue and being tired, also swallowing difficulty and patient's adherence to plan as established by provider Advised patient to keep appointment with radiology for swallowing evaluation Provided education to patient re: resources available, review of food banks and the patient has plans to go to food bank tomorrow locally Reviewed medications with patient and discussed compliance Provided patient with swallowing study information/ educational materials related to the patient getting a swallowing evaluation on the 12th Reviewed scheduled/upcoming provider appointments including 03-06-2022 at 9 am Social Work referral for ongoing support and education Discussed plans with patient for ongoing care management follow up and provided patient with direct contact information for care management team Advised patient to discuss cpap use, swallowing difficulties, fatigue and changes in health and well being with provider Screening for signs and symptoms of depression related to chronic disease state  Assessed social determinant of health barriers        RNCM: Fall prevention and Safety       Care Coordination Interventions: Provided written and verbal education re: potential causes of falls and Fall prevention strategies Reviewed medications and discussed potential side effects of medications such as dizziness and frequent urination Advised patient of importance of notifying provider of falls Assessed for signs and symptoms of orthostatic hypotension Assessed for falls since last encounter. Last fall was on 12-11-2021. No new falls.   Assessed patients knowledge of fall risk prevention secondary to previously provided education. Review with patient to monitor for risk factors and fall risk.  Provided patient information for fall alert systems. Review and patient has a life alert system. She has not set it up yet as she just received. Encouraged her to set up the system, especially since she lives alone. Assessed working status of life alert bracelet and patient adherence Advised patient to discuss fall prevention and safety concerns with provider Screening for signs and symptoms of depression related to chronic disease state  Assessed social determinant of health barriers Social worker referral for assistance with resource to help in the home. Working with the LCSW Patient interviewed about adult health maintenance status including  Falls risk assessment     Active listening / Reflection utilized  Emotional Support Provided         Our next appointment is by telephone on 04-03-2022 at 130 pm  Please call the care guide team at [phone] if you need to cancel or reschedule your appointment.   If you are experiencing a Mental Health or [place] or need someone to talk to, please call the Suicide and Crisis Lifeline: 988 call the [place] National Suicide Prevention Lifeline: 518-372-8466 or TTY: 9255860219 TTY 804 451 7749) to talk to a trained counselor call 1-800-273-TALK (toll free, 24 hour hotline)  Patient verbalizes understanding of instructions and care plan provided today and agrees to view in [place]. Active MyChart status and patient understanding of how to access instructions and care plan via MyChart confirmed with patient.     Telephone follow up appointment with care management team member scheduled for: 04-03-2022 at 130 pm  Alto Denver RN, MSN, CCM [facility] Coordinator [place]  Mobile: [phone]

## 2022-01-31 NOTE — Telephone Encounter (Signed)
Returned call to patient and made aware of provider advise, no further questions.

## 2022-01-31 NOTE — Telephone Encounter (Signed)
Yes, I don't think her labs have any reason why she shouldn't continue

## 2022-02-01 ENCOUNTER — Ambulatory Visit
Admission: RE | Admit: 2022-02-01 | Discharge: 2022-02-01 | Disposition: A | Discharge status: Home / Self Care | Payer: BC Managed Care – PPO | Source: Ambulatory Visit | Attending: Otolaryngology | Admitting: Otolaryngology

## 2022-02-01 DIAGNOSIS — R1310 Dysphagia, unspecified: Secondary | ICD-10-CM

## 2022-02-01 DIAGNOSIS — R1314 Dysphagia, pharyngoesophageal phase: Secondary | ICD-10-CM
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
## 2022-02-04 DIAGNOSIS — G4733 Obstructive sleep apnea (adult) (pediatric): Secondary | ICD-10-CM | POA: Diagnosis not present

## 2022-02-13 ENCOUNTER — Ambulatory Visit: Payer: BC Managed Care – PPO | Attending: Pain Medicine | Admitting: Pain Medicine

## 2022-02-13 ENCOUNTER — Encounter: Payer: Self-pay | Admitting: Pain Medicine

## 2022-02-13 ENCOUNTER — Ambulatory Visit
Admission: RE | Admit: 2022-02-13 | Discharge: 2022-02-13 | Disposition: A | Discharge status: Home / Self Care | Payer: BC Managed Care – PPO | Source: Ambulatory Visit | Attending: Pain Medicine | Admitting: Pain Medicine

## 2022-02-13 VITALS — BP 133/101 | HR 87 | Temp 97.0°F | Resp 13 | Ht 62.0 in | Wt 153.0 lb

## 2022-02-13 DIAGNOSIS — S32030S Wedge compression fracture of third lumbar vertebra, sequela: Secondary | ICD-10-CM | POA: Insufficient documentation

## 2022-02-13 DIAGNOSIS — M4310 Spondylolisthesis, site unspecified: Secondary | ICD-10-CM | POA: Insufficient documentation

## 2022-02-13 DIAGNOSIS — R200 Anesthesia of skin: Secondary | ICD-10-CM | POA: Insufficient documentation

## 2022-02-13 DIAGNOSIS — Z888 Allergy status to other drugs, medicaments and biological substances status: Secondary | ICD-10-CM | POA: Diagnosis not present

## 2022-02-13 DIAGNOSIS — R937 Abnormal findings on diagnostic imaging of other parts of musculoskeletal system: Secondary | ICD-10-CM | POA: Diagnosis not present

## 2022-02-13 DIAGNOSIS — M5136 Other intervertebral disc degeneration, lumbar region: Secondary | ICD-10-CM

## 2022-02-13 DIAGNOSIS — M545 Low back pain, unspecified: Secondary | ICD-10-CM | POA: Diagnosis not present

## 2022-02-13 DIAGNOSIS — G5603 Carpal tunnel syndrome, bilateral upper limbs: Secondary | ICD-10-CM | POA: Diagnosis not present

## 2022-02-13 DIAGNOSIS — Z87892 Personal history of anaphylaxis: Secondary | ICD-10-CM | POA: Diagnosis present

## 2022-02-13 DIAGNOSIS — M79605 Pain in left leg: Secondary | ICD-10-CM | POA: Insufficient documentation

## 2022-02-13 DIAGNOSIS — M2428 Disorder of ligament, vertebrae: Secondary | ICD-10-CM | POA: Insufficient documentation

## 2022-02-13 DIAGNOSIS — G8929 Other chronic pain: Secondary | ICD-10-CM | POA: Insufficient documentation

## 2022-02-13 DIAGNOSIS — M5417 Radiculopathy, lumbosacral region: Secondary | ICD-10-CM | POA: Insufficient documentation

## 2022-02-13 DIAGNOSIS — Z91041 Radiographic dye allergy status: Secondary | ICD-10-CM | POA: Insufficient documentation

## 2022-02-13 DIAGNOSIS — S32039S Unspecified fracture of third lumbar vertebra, sequela: Secondary | ICD-10-CM | POA: Insufficient documentation

## 2022-02-13 DIAGNOSIS — Z91013 Allergy to seafood: Secondary | ICD-10-CM | POA: Diagnosis present

## 2022-02-13 DIAGNOSIS — R202 Paresthesia of skin: Secondary | ICD-10-CM | POA: Diagnosis not present

## 2022-02-13 DIAGNOSIS — M79604 Pain in right leg: Secondary | ICD-10-CM | POA: Diagnosis not present

## 2022-02-13 MED ORDER — LIDOCAINE HCL 2 % IJ SOLN
20.0000 mL | Freq: Once | INTRAMUSCULAR | Status: AC
  Administered 2022-02-13: 400 mg
  Filled 2022-02-13: qty 20

## 2022-02-13 MED ORDER — PENTAFLUOROPROP-TETRAFLUOROETH EX AERO: INHALATION_SPRAY | Freq: Once | CUTANEOUS | Status: DC

## 2022-02-13 MED ORDER — LACTATED RINGERS IV SOLN: Freq: Once | INTRAVENOUS | Status: AC

## 2022-02-13 MED ORDER — ROPIVACAINE HCL 2 MG/ML IJ SOLN
2.0000 mL | Freq: Once | INTRAMUSCULAR | Status: AC
  Administered 2022-02-13: 2 mL via EPIDURAL
  Filled 2022-02-13: qty 20

## 2022-02-13 MED ORDER — TRIAMCINOLONE ACETONIDE 40 MG/ML IJ SUSP
40.0000 mg | Freq: Once | INTRAMUSCULAR | Status: AC
  Administered 2022-02-13: 40 mg
  Filled 2022-02-13: qty 1

## 2022-02-13 MED ORDER — SODIUM CHLORIDE 0.9% FLUSH
2.0000 mL | Freq: Once | INTRAVENOUS | Status: AC
  Administered 2022-02-13: 2 mL

## 2022-02-13 MED ORDER — MIDAZOLAM HCL 2 MG/2ML IJ SOLN
0.5000 mg | Freq: Once | INTRAMUSCULAR | Status: DC
  Filled 2022-02-13: qty 2

## 2022-02-13 NOTE — Patient Instructions (Signed)
____________________________________________________________________________________________  Post-Procedure Discharge Instructions  Instructions: Apply ice:  Purpose: This will minimize any swelling and discomfort after procedure.  When: Day of procedure, as soon as you get home. How: Fill a plastic sandwich bag with crushed ice. Cover it with a small towel and apply to injection site. How long: (15 min on, 15 min off) Apply for 15 minutes then remove x 15 minutes.  Repeat sequence on day of procedure, until you go to bed. Apply heat:  Purpose: To treat any soreness and discomfort from the procedure. When: Starting the next day after the procedure. How: Apply heat to procedure site starting the day following the procedure. How long: May continue to repeat daily, until discomfort goes away. Food intake: Start with clear liquids (like water) and advance to regular food, as tolerated.  Physical activities: Keep activities to a minimum for the first 8 hours after the procedure. After that, then as tolerated. Driving: If you have received any sedation, be responsible and do not drive. You are not allowed to drive for 24 hours after having sedation. Blood thinner: (Applies only to those taking blood thinners) You may restart your blood thinner 6 hours after your procedure. Insulin: (Applies only to Diabetic patients taking insulin) As soon as you can eat, you may resume your normal dosing schedule. Infection prevention: Keep procedure site clean and dry. Shower daily and clean area with soap and water. Post-procedure Pain Diary: Extremely important that this be done correctly and accurately. Recorded information will be used to determine the next step in treatment. For the purpose of accuracy, follow these rules: Evaluate only the area treated. Do not report or include pain from an untreated area. For the purpose of this evaluation, ignore all other areas of pain, except for the treated area. After  your procedure, avoid taking a long nap and attempting to complete the pain diary after you wake up. Instead, set your alarm clock to go off every hour, on the hour, for the initial 8 hours after the procedure. Document the duration of the numbing medicine, and the relief you are getting from it. Do not go to sleep and attempt to complete it later. It will not be accurate. If you received sedation, it is likely that you were given a medication that may cause amnesia. Because of this, completing the diary at a later time may cause the information to be inaccurate. This information is needed to plan your care. Follow-up appointment: Keep your post-procedure follow-up evaluation appointment after the procedure (usually 2 weeks for most procedures, 6 weeks for radiofrequencies). DO NOT FORGET to bring you pain diary with you.   Expect: (What should I expect to see with my procedure?) From numbing medicine (AKA: Local Anesthetics): Numbness or decrease in pain. You may also experience some weakness, which if present, could last for the duration of the local anesthetic. Onset: Full effect within 15 minutes of injected. Duration: It will depend on the type of local anesthetic used. On the average, 1 to 8 hours.  From steroids (Applies only if steroids were used): Decrease in swelling or inflammation. Once inflammation is improved, relief of the pain will follow. Onset of benefits: Depends on the amount of swelling present. The more swelling, the longer it will take for the benefits to be seen. In some cases, up to 10 days. Duration: Steroids will stay in the system x 2 weeks. Duration of benefits will depend on multiple posibilities including persistent irritating factors. Side-effects: If present, they   may typically last 2 weeks (the duration of the steroids). Frequent: Cramps (if they occur, drink Gatorade and take over-the-counter Magnesium 450-500 mg once to twice a day); water retention with temporary  weight gain; increases in blood sugar; decreased immune system response; increased appetite. Occasional: Facial flushing (red, warm cheeks); mood swings; menstrual changes. Uncommon: Long-term decrease or suppression of natural hormones; bone thinning. (These are more common with higher doses or more frequent use. This is why we prefer that our patients avoid having any injection therapies in other practices.)  Very Rare: Severe mood changes; psychosis; aseptic necrosis. From procedure: Some discomfort is to be expected once the numbing medicine wears off. This should be minimal if ice and heat are applied as instructed.  Call if: (When should I call?) You experience numbness and weakness that gets worse with time, as opposed to wearing off. New onset bowel or bladder incontinence. (Applies only to procedures done in the spine)  Emergency Numbers: Durning business hours (Monday - Thursday, 8:00 AM - 4:00 PM) (Friday, 9:00 AM - 12:00 Noon): [phone] After hours: [phone] NOTE: If you are having a problem and are unable connect with, or to talk to a provider, then go to your nearest urgent care or emergency department. If the problem is serious and urgent, please call 911. ____________________________________________________________________________________________  ____________________________________________________________________________________________  Patient Information update  To: All of our patients.  Re: Name change.  It has been made official that our current name, "[facility]"   will soon be changed "[facility]".   The purpose of this change is to eliminate any confusion created by the concept of our practice being a "Pain Clinic". In the past this has led to the misconception that we treat pain primarily by the use of prescription medications.  Nothing can be  farther from the truth.   Understanding PAIN MANAGEMENT: To further understand what our practice does, you first have to understand that "Pain Management" is a subspecialty that requires additional training once a physician has completed their specialty training, which can be in either Anesthesia, Neurology, Psychiatry, or Physical Medicine and Rehabilitation (PMR). Each one of these contributes to the final approach taken by each physician to the management of their patient's pain. To be a "Pain Management Specialist" you must have completed one of the specialty trainings below.  Anesthesiologists are trained in clinical pharmacology and interventional techniques such as nerve blockade and regional as well as central neuroanatomy. They are trained to block pain before, during, and after surgical interventions.  Neurologists are trained in the diagnosis and pharmacological treatment of complex neurological conditions, such as Multiple Sclerosis, Parkinson's, spinal cord injuries, and other systemic conditions that may be associated with symptoms that may include but are not limited to pain. They tend to rely primarily on the treatment of chronic pain using prescription medications.  Psychiatrist are trained in conditions affecting the psychosocial wellbeing of patients including but not limited to depression, anxiety, schizophrenia, personality disorders, addiction, and other substance use disorders that may be associated with chronic pain. They tend to rely primarily on the treatment of chronic pain using prescription medications.   Physical Medicine and Rehabilitation (PMR) physicians, also known as physiatrists, treat a wide variety of medical conditions affecting the brain, spinal cord, nerves, bones, joints, ligaments, muscles, and tendons. Their training is primarily aimed at treating patients that have suffered injuries that have caused severe physical impairment. They   are trained in the physical  therapy and rehabilitation of those patients. They may also work alongside orthopedic surgeons or neurosurgeons using their expertise in assisting patients to recover after their surgery.  INTERVENTIONAL PAIN MANAGEMENT is s sub-subspecialty of Pain Management.  Our physicians are Board-certified in Anesthesia, Pain Management, and Interventional Pain Management.  This meaning that not only have they been trained and Board-certified in their specialty of Anesthesia, subspecialty of Pain Management, but they have also received further training in the sub-subspecialty of Interventional Pain Management, in order to be Board-certified as INTERVENTIONAL PAIN MANAGEMENT SPECIALIST.    Mission: Our goal is to use INTERVENTIONAL PAIN MANAGEMENT techniques as an alternative option to the chronic use of prescription medication for the treatment of pain. To make this clear, we have changed our name and we will no longer be taking patients for medication management. We will continue to offer medication management assessment and recommendations, but we will not be taking over any patient's medication regimen.  ____________________________________________________________________________________________   

## 2022-02-13 NOTE — Progress Notes (Signed)
Safety precautions to be maintained throughout the outpatient stay will include: orient to surroundings, keep bed in low position, maintain call bell within reach at all times, provide assistance with transfer out of bed and ambulation.  

## 2022-02-13 NOTE — Progress Notes (Signed)
PROVIDER NOTE: Interpretation of information contained herein should be left to medically-trained personnel. Specific patient instructions are provided elsewhere under "Patient Instructions" section of medical record. This document was created in part using STT-dictation technology, any transcriptional errors that may result from this process are unintentional.  Patient: Lisa Galvan Type: Established DOB: 1961-03-30 MRN: [id number] PCP: [name], DO  Service: Procedure DOS: 02/13/2022 Setting: Ambulatory Location: Ambulatory outpatient facility Delivery: Face-to-face Provider: Gaspar Cola, MD Specialty: Interventional Pain Management Specialty designation: 09 Location: Outpatient facility Ref. Prov.: [name], DO    Primary Reason for Visit: Interventional Pain Management Treatment. CC: Back Pain (Lumbar bilateral ) and Hand Pain (Bilateral hands... nuimbness, reported this last visit and now numbness is worse )   Procedure:           Type: Lumbar epidural steroid injection (LESI) (interlaminar) #3    Laterality: Left   Level:  L4-5 Level.  Imaging: Fluoroscopic guidance         Anesthesia: Local anesthesia (1-2% Lidocaine) Anxiolysis: None                 Sedation: No Sedation                       DOS: 02/13/2022  Performed by: Gaspar Cola, MD  Purpose: Diagnostic/Therapeutic Indications: Lumbar radicular pain of intraspinal etiology of more than 4 weeks that has failed to respond to conservative therapy and is severe enough to impact quality of life or function. 1. Chronic lower extremity pain (2ry area of Pain) (Bilateral)   2. DDD (degenerative disc disease), lumbar   3. Grade 1 Anterolisthesis of L4/5   4. L3 superior endplate closed fracture, sequela   5. Ligamentum flavum hypertrophy (L4-5)   6. Lumbosacral radiculopathy at L5 (Bilateral)   7. Numbness and tingling of both legs (L5 dermatome)   8. Closed compression fracture of L3 lumbar  vertebra, sequela   9. Chronic low back pain (1ry area of Pain) (Bilateral) w/o sciatica   10. Abnormal MRI, lumbar spine (02/08/2019 & 07/08/2021)    History of Allergy to iodine    History of allergy to radiographic contrast media    History of allergy to shellfish    History of anaphylaxis    NAS-11 Pain score:   Pre-procedure: 6 /10   Post-procedure: 3 /10      Position / Prep / Materials:  Position: Prone w/ head of the table raised (slight reverse trendelenburg) to facilitate breathing.  Prep solution: DuraPrep (Iodine Povacrylex [0.7% available iodine] and Isopropyl Alcohol, 74% w/w) Prep Area: Entire Posterior Lumbar Region from lower scapular tip down to mid buttocks area and from flank to flank. Materials:  Tray: Epidural tray Needle(s):  Type: Epidural needle (Tuohy) Gauge (G):  17 Length: Regular (3.5-in) Qty: 1  Pre-op H&P Assessment:  Lisa Galvan is a 61 y.o. (year old), female patient, seen today for interventional treatment. She  has a past surgical history that includes Esophagogastroduodenoscopy (egd) with propofol (N/A, 03/12/2017); Umbilical hernia repair (N/A, 05/01/2017); Hernia repair; Esophagogastroduodenoscopy (egd) with propofol (N/A, 03/30/2019); Colonoscopy with propofol (N/A, 03/30/2019); ORIF ankle fracture (Left, 07/30/2019); XI robotic assisted ventral hernia (N/A, 10/06/2019); Incisional hernia repair (N/A, 11/29/2020); and Supracervical abdominal hysterectomy. Lisa Galvan has a current medication list which includes the following prescription(s): accu-chek softclix lancets, aspirin ec, atorvastatin, vitamin d-3, duloxetine, duloxetine, epinephrine, fexofenadine, gabapentin, ketoconazole, magnesium, metformin, montelukast, omeprazole, potassium chloride, sucralfate, cyanocobalamin, and ibuprofen, and the following  Facility-Administered Medications: midazolam and pentafluoroprop-tetrafluoroeth. Her primarily concern today is the Back Pain (Lumbar bilateral ) and  Hand Pain (Bilateral hands... nuimbness, reported this last visit and now numbness is worse )  Initial Vital Signs:  Pulse/HCG Rate: 91  Temp: (!) 97 F (36.1 C) Resp: 16 BP: (!) 119/94 SpO2: 100 %  BMI: Estimated body mass index is 27.98 kg/m as calculated from the following:   Height as of this encounter: 5\' 2"  (1.575 m).   Weight as of this encounter: 153 lb (69.4 kg).  Risk Assessment: Allergies: Reviewed. She is allergic to benazepril, ivp dye [iodinated contrast media], lidocaine, lyrica [pregabalin], shellfish allergy, shellfish-derived products, trulicity [dulaglutide], and ace inhibitors.  Allergy Precautions: None required Coagulopathies: Reviewed. None identified.  Blood-thinner therapy: None at this time Active Infection(s): Reviewed. None identified. Lisa Galvan is afebrile  Site Confirmation: Lisa Galvan was asked to confirm the procedure and laterality before marking the site Procedure checklist: Completed Consent: Before the procedure and under the influence of no sedative(s), amnesic(s), or anxiolytics, the patient was informed of the treatment options, risks and possible complications. To fulfill our ethical and legal obligations, as recommended by the American Medical Association's Code of Ethics, I have informed the patient of my clinical impression; the nature and purpose of the treatment or procedure; the risks, benefits, and possible complications of the intervention; the alternatives, including doing nothing; the risk(s) and benefit(s) of the alternative treatment(s) or procedure(s); and the risk(s) and benefit(s) of doing nothing. The patient was provided information about the general risks and possible complications associated with the procedure. These may include, but are not limited to: failure to achieve desired goals, infection, bleeding, organ or nerve damage, allergic reactions, paralysis, and death. In addition, the patient was informed of those risks and  complications associated to Spine-related procedures, such as failure to decrease pain; infection (i.e.: Meningitis, epidural or intraspinal abscess); bleeding (i.e.: epidural hematoma, subarachnoid hemorrhage, or any other type of intraspinal or peri-dural bleeding); organ or nerve damage (i.e.: Any type of peripheral nerve, nerve root, or spinal cord injury) with subsequent damage to sensory, motor, and/or autonomic systems, resulting in permanent pain, numbness, and/or weakness of one or several areas of the body; allergic reactions; (i.e.: anaphylactic reaction); and/or death. Furthermore, the patient was informed of those risks and complications associated with the medications. These include, but are not limited to: allergic reactions (i.e.: anaphylactic or anaphylactoid reaction(s)); adrenal axis suppression; blood sugar elevation that in diabetics may result in ketoacidosis or comma; water retention that in patients with history of congestive heart failure may result in shortness of breath, pulmonary edema, and decompensation with resultant heart failure; weight gain; swelling or edema; medication-induced neural toxicity; particulate matter embolism and blood vessel occlusion with resultant organ, and/or nervous system infarction; and/or aseptic necrosis of one or more joints. Finally, the patient was informed that Medicine is not an exact science; therefore, there is also the possibility of unforeseen or unpredictable risks and/or possible complications that may result in a catastrophic outcome. The patient indicated having understood very clearly. We have given the patient no guarantees and we have made no promises. Enough time was given to the patient to ask questions, all of which were answered to the patient's satisfaction. Ms. Diersen has indicated that she wanted to continue with the procedure. Attestation: I, the ordering provider, attest that I have discussed with the patient the benefits, risks,  side-effects, alternatives, likelihood of achieving goals, and potential problems during recovery for the  procedure that I have provided informed consent. Date  Time: 02/13/2022 11:13 AM  Pre-Procedure Preparation:  Monitoring: As per clinic protocol. Respiration, ETCO2, SpO2, BP, heart rate and rhythm monitor placed and checked for adequate function Safety Precautions: Patient was assessed for positional comfort and pressure points before starting the procedure. Time-out: I initiated and conducted the "Time-out" before starting the procedure, as per protocol. The patient was asked to participate by confirming the accuracy of the "Time Out" information. Verification of the correct person, site, and procedure were performed and confirmed by me, the nursing staff, and the patient. "Time-out" conducted as per Joint Commission's Universal Protocol (UP.01.01.01). Time: 1137  Description/Narrative of Procedure:          Target: Epidural space via interlaminar opening, initially targeting the lower laminar border of the superior vertebral body. Region: Lumbar Approach: Percutaneous paravertebral  Rationale (medical necessity): procedure needed and proper for the diagnosis and/or treatment of the patient's medical symptoms and needs. Procedural Technique Safety Precautions: Aspiration looking for blood return was conducted prior to all injections. At no point did we inject any substances, as a needle was being advanced. No attempts were made at seeking any paresthesias. Safe injection practices and needle disposal techniques used. Medications properly checked for expiration dates. SDV (single dose vial) medications used. Description of the Procedure: Protocol guidelines were followed. The procedure needle was introduced through the skin, ipsilateral to the reported pain, and advanced to the target area. Bone was contacted and the needle walked caudad, until the lamina was cleared. The epidural space was  identified using "loss-of-resistance technique" with 2-3 ml of PF-NaCl (0.9% NSS), in a 5cc LOR glass syringe.  Vitals:   02/13/22 1105 02/13/22 1135 02/13/22 1141 02/13/22 1145  BP: (!) 119/94 (!) 135/99 (!) 134/98 (!) 133/101  Pulse: 91 91 88 87  Resp: 16 13 12 13   Temp: (!) 97 F (36.1 C)     TempSrc: Temporal     SpO2: 100% 100% 97% 98%  Weight: 153 lb (69.4 kg)     Height: 5\' 2"  (1.575 m)       Start Time: 1137 hrs. End Time: 1143 hrs.  Imaging Guidance (Spinal):          Type of Imaging Technique: Fluoroscopy Guidance (Spinal) Indication(s): Assistance in needle guidance and placement for procedures requiring needle placement in or near specific anatomical locations not easily accessible without such assistance. Exposure Time: Please see nurses notes. Contrast: Before injecting any contrast, we confirmed that the patient did not have an allergy to iodine, shellfish, or radiological contrast. Once satisfactory needle placement was completed at the desired level, radiological contrast was injected. Contrast injected under live fluoroscopy. No contrast complications. See chart for type and volume of contrast used. Fluoroscopic Guidance: I was personally present during the use of fluoroscopy. "Tunnel Vision Technique" used to obtain the best possible view of the target area. Parallax error corrected before commencing the procedure. "Direction-depth-direction" technique used to introduce the needle under continuous pulsed fluoroscopy. Once target was reached, antero-posterior, oblique, and lateral fluoroscopic projection used confirm needle placement in all planes. Images permanently stored in EMR. Interpretation: I personally interpreted the imaging intraoperatively. Adequate needle placement confirmed in multiple planes. Appropriate spread of contrast into desired area was observed. No evidence of afferent or efferent intravascular uptake. No intrathecal or subarachnoid spread observed.  Permanent images saved into the patient's record.  Antibiotic Prophylaxis:   Anti-infectives (From admission, onward)    None  Indication(s): None identified  Post-operative Assessment:  Post-procedure Vital Signs:  Pulse/HCG Rate: 87  Temp: (!) 97 F (36.1 C) Resp: 13 BP: (!) 133/101 SpO2: 98 %  EBL: None  Complications: No immediate post-treatment complications observed by team, or reported by patient.  Note: The patient tolerated the entire procedure well. A repeat set of vitals were taken after the procedure and the patient was kept under observation following institutional policy, for this type of procedure. Post-procedural neurological assessment was performed, showing return to baseline, prior to discharge. The patient was provided with post-procedure discharge instructions, including a section on how to identify potential problems. Should any problems arise concerning this procedure, the patient was given instructions to immediately contact us, at any time, without hesitation. In any case, we plan to contact the patient by telephone for a follow-up status report regarding this interventional procedure.  Comments:  No additional relevant information.  Plan of Care  Orders:  Orders Placed This Encounter  Procedures   Lumbar Epidural Injection    Scheduling Instructions:     Procedure: Interlaminar LESI L4-5     Laterality: Left     Sedation: Patient's choice     Timeframe: Today    Order Specific Question:   Where will this procedure be performed?    Answer:   [facility]   DG [facility] C-ARM 1-60 MIN NO REPORT    Intraoperative interpretation by procedural physician at [place].    Standing Status:   Standing    Number of Occurrences:   1    Order Specific Question:   Reason for exam:    Answer:   Assistance in needle guidance and placement for procedures requiring needle placement in or near specific anatomical locations not easily  accessible without such assistance.   Ambulatory referral to Hand Surgery    Referral Priority:   Routine    Referral Type:   Surgical    Referral Reason:   Specialty Services Required    Requested Specialty:   Hand Surgery    Number of Visits Requested:   1   Informed Consent Details: Physician/Practitioner Attestation; Transcribe to consent form and obtain patient signature    Note: Always confirm laterality of pain with [name] Mellow, before procedure. Transcribe to consent form and obtain patient signature.    Order Specific Question:   Physician/Practitioner attestation of informed consent for procedure/surgical case    Answer:   I, the physician/practitioner, attest that I have discussed with the patient the benefits, risks, side effects, alternatives, likelihood of achieving goals and potential problems during recovery for the procedure that I have provided informed consent.    Order Specific Question:   Procedure    Answer:   Lumbar epidural steroid injection under fluoroscopic guidance    Order Specific Question:   Physician/Practitioner performing the procedure    Answer:   Eluzer Howdeshell A. Dossie Arbour, MD    Order Specific Question:   Indication/Reason    Answer:   Low back and/or lower extremity pain secondary to lumbar radiculitis   Provide equipment / supplies at bedside    "Epidural Tray" (Disposable  single use) Catheter: NOT required    Standing Status:   Standing    Number of Occurrences:   1    Order Specific Question:   Specify    Answer:   Epidural Tray   Miscellanous precautions    Standing Status:   Standing    Number of Occurrences:   1  Miscellanous precautions    NOTE: Although It is true that patients can have allergies to shellfish and that shellfish contain iodine, most shellfish  allergies are due to two protein allergens present in the shellfish: tropomyosins and parvalbumin. Not all patients with shellfish allergies are allergic to iodine. However, as a precaution,  avoid using iodine containing products.    Standing Status:   Standing    Number of Occurrences:   1   Skin care precautions    Patient indicates being allergic to a specific skin prepping solution. Please avoid.    Standing Status:   Standing    Number of Occurrences:   1   Chronic Opioid Analgesic:  No opioid analgesics prescribed by our practice.   Medications ordered for procedure: Meds ordered this encounter  Medications   lidocaine (XYLOCAINE) 2 % (with pres) injection 400 mg   pentafluoroprop-tetrafluoroeth (GEBAUERS) aerosol   lactated ringers infusion   midazolam (VERSED) injection 0.5-2 mg    Make sure Flumazenil is available in the pyxis when using this medication. If oversedation occurs, administer 0.2 mg IV over 15 sec. If after 45 sec no response, administer 0.2 mg again over 1 min; may repeat at 1 min intervals; not to exceed 4 doses (1 mg)   sodium chloride flush (NS) 0.9 % injection 2 mL   ropivacaine (PF) 2 mg/mL (0.2%) (NAROPIN) injection 2 mL   triamcinolone acetonide (KENALOG-40) injection 40 mg   Medications administered: We administered lidocaine, lactated ringers, sodium chloride flush, ropivacaine (PF) 2 mg/mL (0.2%), and triamcinolone acetonide.  See the medical record for exact dosing, route, and time of administration.  Follow-up plan:   Return in about 2 weeks (around 02/27/2022) for Proc-day (T,Th), (F2F), (PPE).        Interventional Therapies  Risk  Complexity Considerations:   Estimated body mass index is 26.57 kg/m as calculated from the following:   Height as of 01/31/21: 5\' 3"  (1.6 m).   Weight as of 01/31/21: 150 lb (68 kg). NOTE: IVP DYE ALLERGY - ANAPHYLAXIS IODINE ALLERGY - ANAPHYLAXIS SHELLFISH ALLERGY - ANAPHYLAXIS   Planned  Pending:   Therapeutic left L4-5 LESI #3 (+ bilateral L5 TFESI #3 denied by [employer])   Under consideration:   Therapeutic left L4-5 LESI #3 + bilateral L5 TFESI #3  Therapeutic caudal ESI #3  Therapeutic  Qutenza treatment of her diabetic peripheral neuropathy.   Completed:   Diagnostic/therapeutic right CESI x1 (08/03/2021)  Palliative right lumbar facet RFA x1 (06/02/2019) (100/100/95)  Palliative left lumbar facet RFA x1 (06/30/2019) (100/100/95)  Palliative bilateral lumbar facet MBB x2 (04/21/2019) (100/100/40/40)  Palliative right sacroiliac joint Blk x1 (04/21/2019) (100/100/60/40)  Diagnostic midline to right caudal ESI x2 (07/04/2021) (75/75/75/75)  Diagnostic/therapeutic bilateral L5 TFESI x2 (10/31/2021) (LBP:100/100/100/100) (LEP:100/100/80/80)  Diagnostic/therapeutic left L4-5 LESI x2 (10/31/2021) (LBP:100/100/100/100) (LEP:100/100/80/80)    Completed by other providers:   Diagnostic/therapeutic right L4-5 LESI x1 (done by Dr. Primus Bravo on 12/29/2014)  CT injections of her wrist?? Upper extremity numbness + tingling with subjective weakness  (07/25/2021) NCS/EMG upper extremities 07/25/2021: This is an abnormal electrodiagnostic study consistent with a generalized sensory polyneuropathy, superimposed bilateral carpal tunnel syndrome. (03/20/2017) EMG - Impression: This is an abnormal electrodiagnostic exam. Abnormal right sural sensory may represent early peripheral neuropathy related changes. (03/02/2019) EMG - Impression: This is an abnormal electrodiagnostic study consistent with a generalized sensory polyneuropathy, no evidence of superimposed lumbar spine radiculopathy.    Therapeutic  Palliative (PRN) options:   Therapeutic left L4-5 LESI #3 +  bilateral L5 TFESI #3  Palliative lumbar facet RFA  Palliative lumbar facet MBB  Palliative sacroiliac joint Blk  Diagnostic caudal ESI      Recent Visits Date Type Provider Dept  12/18/21 Office Visit Milinda Pointer, MD [facility]  Showing recent visits within past 90 days and meeting all other requirements Today's Visits Date Type Provider Dept  02/13/22 Procedure visit Milinda Pointer, MD [facility]   Showing today's visits and meeting all other requirements Future Appointments Date Type Provider Dept  03/08/22 Appointment Milinda Pointer, MD [facility]  Showing future appointments within next 90 days and meeting all other requirements  Disposition: Discharge home  Discharge (Date  Time): 02/13/2022;   hrs.   Primary Care Physician: [name], DO Location: [facility] Note by: Gaspar Cola, MD Date: 02/13/2022; Time: 1:17 PM  Disclaimer:  Medicine is not an Chief Strategy Officer. The only guarantee in medicine is that nothing is guaranteed. It is important to note that the decision to proceed with this intervention was based on the information collected from the patient. The Data and conclusions were drawn from the patient's questionnaire, the interview, and the physical examination. Because the information was provided in large part by the patient, it cannot be guaranteed that it has not been purposely or unconsciously manipulated. Every effort has been made to obtain as much relevant data as possible for this evaluation. It is important to note that the conclusions that lead to this procedure are derived in large part from the available data. Always take into account that the treatment will also be dependent on availability of resources and existing treatment guidelines, considered by other Pain Management Practitioners as being common knowledge and practice, at the time of the intervention. For Medico-Legal purposes, it is also important to point out that variation in procedural techniques and pharmacological choices are the acceptable norm. The indications, contraindications, technique, and results of the above procedure should only be interpreted and judged by a Board-Certified Interventional Pain Specialist with extensive familiarity and expertise in the same exact procedure and technique.

## 2022-02-14 ENCOUNTER — Telehealth: Payer: Self-pay

## 2022-02-14 NOTE — Telephone Encounter (Signed)
Post procedure phone call.  Patient states she is doing ok.  

## 2022-02-19 DIAGNOSIS — R42 Dizziness and giddiness: Secondary | ICD-10-CM | POA: Diagnosis not present

## 2022-02-19 DIAGNOSIS — G608 Other hereditary and idiopathic neuropathies: Secondary | ICD-10-CM | POA: Diagnosis not present

## 2022-02-19 DIAGNOSIS — G5603 Carpal tunnel syndrome, bilateral upper limbs: Secondary | ICD-10-CM | POA: Diagnosis not present

## 2022-02-19 DIAGNOSIS — G4733 Obstructive sleep apnea (adult) (pediatric): Secondary | ICD-10-CM | POA: Diagnosis not present

## 2022-02-26 ENCOUNTER — Ambulatory Visit: Payer: BC Managed Care – PPO | Attending: Neurology

## 2022-02-26 DIAGNOSIS — R278 Other lack of coordination: Secondary | ICD-10-CM | POA: Insufficient documentation

## 2022-02-26 DIAGNOSIS — G5603 Carpal tunnel syndrome, bilateral upper limbs: Secondary | ICD-10-CM | POA: Insufficient documentation

## 2022-02-26 DIAGNOSIS — M6281 Muscle weakness (generalized): Secondary | ICD-10-CM | POA: Diagnosis not present
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
## 2022-02-28 ENCOUNTER — Ambulatory Visit: Payer: BC Managed Care – PPO

## 2022-02-28 DIAGNOSIS — G5603 Carpal tunnel syndrome, bilateral upper limbs: Secondary | ICD-10-CM | POA: Diagnosis not present

## 2022-02-28 DIAGNOSIS — R278 Other lack of coordination: Secondary | ICD-10-CM

## 2022-02-28 DIAGNOSIS — M6281 Muscle weakness (generalized): Secondary | ICD-10-CM | POA: Diagnosis not present
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
## 2022-03-02 DIAGNOSIS — M170 Bilateral primary osteoarthritis of knee: Secondary | ICD-10-CM | POA: Diagnosis not present

## 2022-03-02 DIAGNOSIS — S83231A Complex tear of medial meniscus, current injury, right knee, initial encounter: Secondary | ICD-10-CM | POA: Diagnosis not present

## 2022-03-02 DIAGNOSIS — S83232A Complex tear of medial meniscus, current injury, left knee, initial encounter: Secondary | ICD-10-CM | POA: Diagnosis not present

## 2022-03-05 ENCOUNTER — Other Ambulatory Visit: Payer: Self-pay | Admitting: Surgery

## 2022-03-05 ENCOUNTER — Ambulatory Visit: Payer: BC Managed Care – PPO

## 2022-03-05 DIAGNOSIS — M170 Bilateral primary osteoarthritis of knee: Secondary | ICD-10-CM

## 2022-03-05 DIAGNOSIS — G5603 Carpal tunnel syndrome, bilateral upper limbs: Secondary | ICD-10-CM

## 2022-03-05 DIAGNOSIS — M6281 Muscle weakness (generalized): Secondary | ICD-10-CM | POA: Diagnosis not present

## 2022-03-05 DIAGNOSIS — R278 Other lack of coordination: Secondary | ICD-10-CM

## 2022-03-05 DIAGNOSIS — S83232A Complex tear of medial meniscus, current injury, left knee, initial encounter: Secondary | ICD-10-CM

## 2022-03-05 DIAGNOSIS — S83231A Complex tear of medial meniscus, current injury, right knee, initial encounter: Secondary | ICD-10-CM
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
## 2022-03-06 ENCOUNTER — Encounter: Payer: Self-pay | Admitting: Family Medicine

## 2022-03-06 ENCOUNTER — Ambulatory Visit: Payer: BC Managed Care – PPO | Admitting: Family Medicine

## 2022-03-06 VITALS — BP 129/88 | HR 89 | Temp 97.6°F | Ht 62.0 in | Wt 152.5 lb

## 2022-03-06 DIAGNOSIS — E1140 Type 2 diabetes mellitus with diabetic neuropathy, unspecified: Secondary | ICD-10-CM

## 2022-03-06 LAB — MICROALBUMIN, URINE WAIVED
Creatinine, Urine Waived: 100 mg/dL (ref 10–300)
Microalb, Ur Waived: 30 mg/L — ABNORMAL HIGH (ref 0–19)
Microalb/Creat Ratio: <30 mg/g (ref <30)

## 2022-03-06 LAB — BAYER DCA HB A1C WAIVED: HB A1C (BAYER DCA - WAIVED): 7.2 % — ABNORMAL HIGH (ref 4.8–5.6)

## 2022-03-06 MED ORDER — SUCRALFATE 1 G PO TABS: 1.0000 g | ORAL_TABLET | Freq: Three times a day (TID) | ORAL | 1 refills | Status: DC

## 2022-03-06 NOTE — Assessment & Plan Note (Signed)
Doing better with A1c of 7.2. down from 7.8. Continue current regimen. Continue to monitor. Call with any concerns.

## 2022-03-06 NOTE — Progress Notes (Signed)
BP 129/88   Pulse 89   Temp 97.6 F (36.4 C) (Oral)   Ht 5\' 2"  (1.575 m)   Wt 152 lb 8 oz (69.2 kg)   SpO2 98%   BMI 27.89 kg/m    Subjective:    Patient ID: , female    DOB: [birth year], 61 y.o.   MRN: [id number]  HPI: Lisa Galvan is a 61 y.o. female  Chief Complaint  Patient presents with   Diabetes   Fatigue is better. She notes that she has not been on her CPAP.   DIABETES Hypoglycemic episodes:no Polydipsia/polyuria: no Visual disturbance: no Chest pain: no Paresthesias: no Glucose Monitoring: yes  Accucheck frequency: Daily Taking Insulin?: no Blood Pressure Monitoring: not checking Retinal Examination: Up to Date Foot Exam: Up to Date Diabetic Education: Completed Pneumovax: Up to Date Influenza: Up to Date Aspirin: yes  Relevant past medical, surgical, family and social history reviewed and updated as indicated. Interim medical history since our last visit reviewed. Allergies and medications reviewed and updated.  Review of Systems  Constitutional: Negative.   Respiratory: Negative.    Cardiovascular: Negative.   Gastrointestinal: Negative.   Musculoskeletal: Negative.   Neurological: Negative.   Psychiatric/Behavioral: Negative.      Per HPI unless specifically indicated above     Objective:    BP 129/88   Pulse 89   Temp 97.6 F (36.4 C) (Oral)   Ht 5\' 2"  (1.575 m)   Wt 152 lb 8 oz (69.2 kg)   SpO2 98%   BMI 27.89 kg/m   Wt Readings from Last 3 Encounters:  03/06/22 152 lb 8 oz (69.2 kg)  02/13/22 153 lb (69.4 kg)  01/15/22 153 lb 12.8 oz (69.8 kg)    Physical Exam Vitals and nursing note reviewed.  Constitutional:      General: She is not in acute distress.    Appearance: Normal appearance. She is normal weight. She is not ill-appearing, toxic-appearing or diaphoretic.  HENT:     Head: Normocephalic and atraumatic.     Right Ear: External ear normal.     Left Ear: External ear normal.     Nose: Nose normal.      Mouth/Throat:     Mouth: Mucous membranes are moist.     Pharynx: Oropharynx is clear.  Eyes:     General: No scleral icterus.       Right eye: No discharge.        Left eye: No discharge.     Extraocular Movements: Extraocular movements intact.     Conjunctiva/sclera: Conjunctivae normal.     Pupils: Pupils are equal, round, and reactive to light.  Cardiovascular:     Rate and Rhythm: Normal rate and regular rhythm.     Pulses: Normal pulses.     Heart sounds: Normal heart sounds. No murmur heard.    No friction rub. No gallop.  Pulmonary:     Effort: Pulmonary effort is normal. No respiratory distress.     Breath sounds: Normal breath sounds. No stridor. No wheezing, rhonchi or rales.  Chest:     Chest wall: No tenderness.  Musculoskeletal:        General: Normal range of motion.     Cervical back: Normal range of motion and neck supple.  Skin:    General: Skin is warm and dry.     Capillary Refill: Capillary refill takes less than 2 seconds.     Coloration: Skin is not jaundiced or pale.  Findings: No bruising, erythema, lesion or rash.  Neurological:     General: No focal deficit present.     Mental Status: She is alert and oriented to person, place, and time. Mental status is at baseline.  Psychiatric:        Mood and Affect: Mood normal.        Behavior: Behavior normal.        Thought Content: Thought content normal.        Judgment: Judgment normal.     Results for orders placed or performed in visit on 01/22/22  HM DIABETES EYE EXAM  Result Value Ref Range   HM Diabetic Eye Exam No Retinopathy No Retinopathy      Assessment & Plan:   Problem List Items Addressed This Visit       Endocrine   Type 2 diabetes mellitus with diabetic neuropathy, unspecified (HCC) - Primary    Doing better with A1c of 7.2. down from 7.8. Continue current regimen. Continue to monitor. Call with any concerns.       Relevant Orders   Bayer DCA Hb A1c Waived   Microalbumin,  Urine Waived     Follow up plan: Return in about 3 months (around 06/06/2022).

## 2022-03-07 ENCOUNTER — Ambulatory Visit: Payer: BC Managed Care – PPO

## 2022-03-07 DIAGNOSIS — M6281 Muscle weakness (generalized): Secondary | ICD-10-CM

## 2022-03-07 DIAGNOSIS — R278 Other lack of coordination: Secondary | ICD-10-CM

## 2022-03-07 DIAGNOSIS — G4733 Obstructive sleep apnea (adult) (pediatric): Secondary | ICD-10-CM | POA: Diagnosis not present

## 2022-03-07 DIAGNOSIS — G5603 Carpal tunnel syndrome, bilateral upper limbs: Secondary | ICD-10-CM | POA: Diagnosis not present

## 2022-03-08 ENCOUNTER — Encounter: Payer: Self-pay | Admitting: Pain Medicine

## 2022-03-08 ENCOUNTER — Ambulatory Visit: Payer: BC Managed Care – PPO

## 2022-03-08 ENCOUNTER — Ambulatory Visit: Payer: BC Managed Care – PPO | Attending: Pain Medicine | Admitting: Pain Medicine

## 2022-03-08 VITALS — BP 139/101 | HR 92 | Temp 97.0°F | Resp 18 | Ht 63.0 in | Wt 152.0 lb

## 2022-03-08 DIAGNOSIS — S32039S Unspecified fracture of third lumbar vertebra, sequela: Secondary | ICD-10-CM | POA: Diagnosis not present

## 2022-03-08 DIAGNOSIS — M47816 Spondylosis without myelopathy or radiculopathy, lumbar region: Secondary | ICD-10-CM | POA: Diagnosis not present

## 2022-03-08 DIAGNOSIS — M5136 Other intervertebral disc degeneration, lumbar region: Secondary | ICD-10-CM | POA: Diagnosis not present

## 2022-03-08 DIAGNOSIS — M545 Low back pain, unspecified: Secondary | ICD-10-CM

## 2022-03-08 DIAGNOSIS — E1142 Type 2 diabetes mellitus with diabetic polyneuropathy: Secondary | ICD-10-CM | POA: Diagnosis not present

## 2022-03-08 DIAGNOSIS — M2540 Effusion, unspecified joint: Secondary | ICD-10-CM

## 2022-03-08 DIAGNOSIS — M47819 Spondylosis without myelopathy or radiculopathy, site unspecified: Secondary | ICD-10-CM

## 2022-03-08 DIAGNOSIS — M79604 Pain in right leg: Secondary | ICD-10-CM

## 2022-03-08 DIAGNOSIS — M79672 Pain in left foot: Secondary | ICD-10-CM

## 2022-03-08 DIAGNOSIS — M4310 Spondylolisthesis, site unspecified: Secondary | ICD-10-CM

## 2022-03-08 DIAGNOSIS — M79671 Pain in right foot: Secondary | ICD-10-CM

## 2022-03-08 DIAGNOSIS — G8929 Other chronic pain: Secondary | ICD-10-CM | POA: Diagnosis not present

## 2022-03-08 DIAGNOSIS — M79605 Pain in left leg: Secondary | ICD-10-CM
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
## 2022-03-12 ENCOUNTER — Ambulatory Visit: Payer: BC Managed Care – PPO

## 2022-03-12 ENCOUNTER — Other Ambulatory Visit: Payer: BC Managed Care – PPO

## 2022-03-14 ENCOUNTER — Ambulatory Visit
Admission: RE | Admit: 2022-03-14 | Discharge: 2022-03-14 | Disposition: A | Discharge status: Home / Self Care | Payer: BC Managed Care – PPO | Source: Ambulatory Visit | Attending: Surgery | Admitting: Surgery

## 2022-03-14 DIAGNOSIS — M170 Bilateral primary osteoarthritis of knee: Secondary | ICD-10-CM

## 2022-03-14 DIAGNOSIS — S83231A Complex tear of medial meniscus, current injury, right knee, initial encounter: Secondary | ICD-10-CM | POA: Diagnosis not present

## 2022-03-14 DIAGNOSIS — R296 Repeated falls: Secondary | ICD-10-CM | POA: Diagnosis not present

## 2022-03-14 DIAGNOSIS — M25462 Effusion, left knee: Secondary | ICD-10-CM | POA: Diagnosis not present

## 2022-03-14 DIAGNOSIS — S83232A Complex tear of medial meniscus, current injury, left knee, initial encounter: Secondary | ICD-10-CM | POA: Diagnosis not present

## 2022-03-14 DIAGNOSIS — S83512A Sprain of anterior cruciate ligament of left knee, initial encounter: Secondary | ICD-10-CM | POA: Diagnosis not present

## 2022-03-14 DIAGNOSIS — M7051 Other bursitis of knee, right knee: Secondary | ICD-10-CM | POA: Diagnosis not present

## 2022-03-14 DIAGNOSIS — M1711 Unilateral primary osteoarthritis, right knee: Secondary | ICD-10-CM | POA: Diagnosis not present

## 2022-03-14 DIAGNOSIS — M25461 Effusion, right knee: Secondary | ICD-10-CM | POA: Diagnosis not present

## 2022-03-19 ENCOUNTER — Ambulatory Visit: Payer: BC Managed Care – PPO

## 2022-03-19 DIAGNOSIS — R278 Other lack of coordination: Secondary | ICD-10-CM

## 2022-03-19 DIAGNOSIS — G5603 Carpal tunnel syndrome, bilateral upper limbs: Secondary | ICD-10-CM | POA: Diagnosis not present

## 2022-03-19 DIAGNOSIS — M6281 Muscle weakness (generalized): Secondary | ICD-10-CM
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
## 2022-03-20 ENCOUNTER — Encounter: Payer: Self-pay | Admitting: Physician Assistant

## 2022-03-20 ENCOUNTER — Ambulatory Visit: Payer: Self-pay | Admitting: *Deleted

## 2022-03-20 ENCOUNTER — Telehealth (INDEPENDENT_AMBULATORY_CARE_PROVIDER_SITE_OTHER): Payer: BC Managed Care – PPO | Admitting: Physician Assistant

## 2022-03-20 ENCOUNTER — Ambulatory Visit: Payer: BC Managed Care – PPO

## 2022-03-20 DIAGNOSIS — U071 COVID-19: Secondary | ICD-10-CM

## 2022-03-20 MED ORDER — MOLNUPIRAVIR EUA 200MG CAPSULE: 4.0000 | ORAL_CAPSULE | Freq: Two times a day (BID) | ORAL | 0 refills | Status: DC

## 2022-03-20 NOTE — Patient Instructions (Addendum)
   The goal of treatment at this time is to reduce your symptoms and discomfort   I have sent in the antiviral Molnupiravir to help with lessening your COVID symptoms and reducing how long you are sick    You can use over the counter medications such as Dayquil/Nyquil, AlkaSeltzer formulations, etc to provide further relief of symptoms according to the manufacturer's instructions  If preferred you can use Coricidin to manage your symptoms rather than those medications mentioned above.   You should try to stay in your home and away from others until Saturday  After this, if you need to go out, please wear a mask when you are around others for an additional 5 days    If your symptoms do not improve or become worse in the next 5-7 days please make an apt at the office so we can see you  Go to the ER if you begin to have more serious symptoms such as shortness of breath, trouble breathing, loss of consciousness, swelling around the eyes, high fever, severe lasting headaches, vision changes or neck pain/stiffness.

## 2022-03-20 NOTE — Telephone Encounter (Signed)
The patient is experiencing cough, congestion, sore throat, headache and lack of appetite   The patient shares that they have had symptoms since 03/19/22   The patient would like to be prescribed something for their symptoms   Please contact further when possible   Chief Complaint: Covid Positive Symptoms: Home test this AM. Body aches, cough, sore throat,headache congestion,loss of appetite. SOB "When lying down" Frequency: Symptoms onset yesterday Pertinent Negatives: Patient denies fever Disposition: [] ED /[] Urgent Care (no appt availability in office) / [x] Appointment(In office/virtual)/ []  [facility]/ [] Home Care/ [] Refused Recommended Disposition /[] [facility]/ []  Follow-up with PCP Additional Notes: Pt is interested in oral anti-viral agent.  MyChart visit secured for this afternoon. Care advise provided, self-isolation guidelines reviewed. Pt verbalizes understanding.  Reason for Disposition  [1] HIGH RISK patient (e.g., weak immune system, age > 64 years, obesity with BMI 30 or higher, pregnant, chronic lung disease or other chronic medical condition) AND [2] COVID symptoms (e.g., cough, fever)  (Exceptions: Already seen by PCP and no new or worsening symptoms.)  Answer Assessment - Initial Assessment Questions 1. COVID-19 DIAGNOSIS: "How do you know that you have COVID?" (e.g., positive lab test or self-test, diagnosed by doctor or NP/PA, symptoms after exposure).     Home test this AM 2. COVID-19 EXPOSURE: "Was there any known exposure to COVID before the symptoms began?" CDC Definition of close contact: within 6 feet (2 meters) for a total of 15 minutes or more over a 24-hour period.      no 3. ONSET: "When did the COVID-19 symptoms start?"      Yesterday 4. WORST SYMPTOM: "What is your worst symptom?" (e.g., cough, fever, shortness of breath, muscle aches)     Cough and congestion 5. COUGH: "Do you have a cough?" If Yes, ask: "How bad is the cough?"        Cough, productive 6. FEVER: "Do you have a fever?" If Yes, ask: "What is your temperature, how was it measured, and when did it start?"     No 7. RESPIRATORY STATUS: "Describe your breathing?" (e.g., normal; shortness of breath, wheezing, unable to speak)      SOB only when lying down 8. BETTER-SAME-WORSE: "Are you getting better, staying the same or getting worse compared to yesterday?"  If getting worse, ask, "In what way?"     Worse 9. OTHER SYMPTOMS: "Do you have any other symptoms?"  (e.g., chills, fatigue, headache, loss of smell or taste, muscle pain, sore throat)     Body aches, sore throat 10. HIGH RISK DISEASE: "Do you have any chronic medical problems?" (e.g., asthma, heart or lung disease, weak immune system, obesity, etc.)       yes 11. VACCINE: "Have you had the COVID-19 vaccine?" If Yes, ask: "Which one, how many shots, when did you get it?"       All vaccines and boosters  Protocols used: Coronavirus (COVID-19) Diagnosed or Suspected-A-AH

## 2022-03-20 NOTE — Progress Notes (Signed)
      Virtual Visit via Video Note  I connected with Lisa Galvan on 03/20/22 at  2:20 PM EST by a video enabled telemedicine application and verified that I am speaking with the correct person using two identifiers.  Today's Provider: Jacquelin Hawking, MHS, PA-C Introduced myself to the patient as a PA-C and provided education on APPs in clinical practice.   Location: Patient: at home, [place], [place]  Provider: [facility], [place], [place]    I discussed the limitations of evaluation and management by telemedicine and the availability of in person appointments. The patient expressed understanding and agreed to proceed.   Chief Complaint  Patient presents with   Covid Positive    Patient states she tested positive for COVID this morning, symptoms began yesterday. Patient is coughing, ST, congestion, Right ear pain, and headache.      History of Present Illness:  Reports she tested positive for COVID this AM Symptoms: Productive cough, nasal congestion, headaches, right ear pain Reports sore throat seems to be getting better Denies fever, nausea, vomiting, diarrhea Reports SOB when she is laying down  Onset: sudden Duration: since yesterday  Interventions: nothing    Review of Systems  Constitutional:  Negative for chills, fever and malaise/fatigue.  HENT:  Positive for congestion, ear pain and sore throat.   Respiratory:  Positive for cough and sputum production. Negative for shortness of breath and wheezing.   Gastrointestinal:  Negative for diarrhea, nausea and vomiting.  Musculoskeletal:  Positive for myalgias.  Neurological:  Positive for dizziness and headaches.    Observations/Objective:  Due to the nature of the virtual visit, physical exam and observations are limited. Able to obtain the following observations:   Alert, oriented x 3 Appears comfortable, in no acute distress.  No scleral injection, tachypnea, wheeze or strider. Able to maintain conversation  without visible strain.  Mild hoarseness appreciated  No cough appreciated during visit.    Assessment and Plan:  Problem List Items Addressed This Visit   None Visit Diagnoses     COVID-19    -  Primary Acute, new concern Patient reports her symptoms started yesterday and she tested positive for COVID this morning Will start Molnupiravir to help with symptoms severity and reducing illness timeline Recommend using OTC multi-symptom medications per preference to provide additional symptom management  Reviewed ED and return precautions as well as quarantine and masking recommendations from CDC guidelines  Follow up as needed for persistent or progressing symptoms     Relevant Medications   molnupiravir EUA (LAGEVRIO) 200 mg CAPS capsule      Follow Up Instructions:    I discussed the assessment and treatment plan with the patient. The patient was provided an opportunity to ask questions and all were answered. The patient agreed with the plan and demonstrated an understanding of the instructions.   The patient was advised to call back or seek an in-person evaluation if the symptoms worsen or if the condition fails to improve as anticipated.  I provided 13 minutes of non-face-to-face time during this encounter.  No follow-ups on file.   I, Taia Bramlett E Nazli Penn, PA-C, have reviewed all documentation for this visit. The documentation on 03/20/22 for the exam, diagnosis, procedures, and orders are all accurate and complete.   Jacquelin Hawking, MHS, PA-C [facility]

## 2022-03-21 ENCOUNTER — Ambulatory Visit: Payer: BC Managed Care – PPO

## 2022-03-21 ENCOUNTER — Other Ambulatory Visit: Payer: Self-pay | Admitting: Family Medicine

## 2022-03-21 DIAGNOSIS — U071 COVID-19: Secondary | ICD-10-CM

## 2022-03-21 NOTE — Telephone Encounter (Signed)
Requested medication (s) are due for refill today:Yes  Requested medication (s) are on the active medication list: Yes  Last refill:  03/20/22  Future visit scheduled: No  Notes to clinic:  Unable to refill due to no refill protocol for this medication, routing to provider for refill.      Requested Prescriptions  Pending Prescriptions Disp Refills   molnupiravir EUA (LAGEVRIO) 200 mg CAPS capsule 40 capsule 0    Sig: Take 4 capsules (800 mg total) by mouth 2 (two) times daily for 5 days.     Off-Protocol Failed - 03/21/2022 12:35 PM      Failed - Medication not assigned to a protocol, review manually.      Passed - Valid encounter within last 12 months    Recent Outpatient Visits           Yesterday COVID-19   [facility] [place] M[place]um, Erin E, PA-C   2 weeks ago Type 2 diabetes mellitus with diabetic neuropathy, without long-term current use of insulin (HCC)   [facility] [place], [place], DO   2 months ago Chronic fatigue   [facility] [place], [place], DO   3 months ago Dizziness   [facility] [place], [place], DO   3 months ago Essential hypertension   [facility] [place], [place], DO       Future Appointments             In 2 weeks Vanga, Loel Dubonnet, MD [place] [facility]   In 2 months Laural Benes, Oralia Rud, DO [employer], [facility]

## 2022-03-21 NOTE — Telephone Encounter (Signed)
Can RX for molnupiravir EUA (LAGEVRIO) 200 mg CAPS capsule  please be sent to [id number] - [place], [place] - [address] / [facility] RX but they do not have this in stock and it is on back order / please advise when sent

## 2022-03-22 MED ORDER — MOLNUPIRAVIR EUA 200MG CAPSULE: 4.0000 | ORAL_CAPSULE | Freq: Two times a day (BID) | ORAL | 0 refills | Status: AC

## 2022-03-26 ENCOUNTER — Ambulatory Visit: Payer: BC Managed Care – PPO

## 2022-03-28 ENCOUNTER — Ambulatory Visit: Payer: BC Managed Care – PPO | Attending: Neurology

## 2022-03-28 DIAGNOSIS — R278 Other lack of coordination: Secondary | ICD-10-CM | POA: Diagnosis not present

## 2022-03-28 DIAGNOSIS — M6281 Muscle weakness (generalized): Secondary | ICD-10-CM | POA: Diagnosis not present

## 2022-03-28 DIAGNOSIS — G5603 Carpal tunnel syndrome, bilateral upper limbs: Secondary | ICD-10-CM | POA: Insufficient documentation
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
## 2022-03-29 ENCOUNTER — Ambulatory Visit: Payer: BC Managed Care – PPO

## 2022-03-30 DIAGNOSIS — S83231D Complex tear of medial meniscus, current injury, right knee, subsequent encounter: Secondary | ICD-10-CM | POA: Diagnosis not present

## 2022-03-30 DIAGNOSIS — S83232D Complex tear of medial meniscus, current injury, left knee, subsequent encounter: Secondary | ICD-10-CM | POA: Diagnosis not present

## 2022-03-30 DIAGNOSIS — M1712 Unilateral primary osteoarthritis, left knee: Secondary | ICD-10-CM | POA: Insufficient documentation

## 2022-03-30 DIAGNOSIS — M170 Bilateral primary osteoarthritis of knee: Secondary | ICD-10-CM | POA: Diagnosis not present

## 2022-04-02 ENCOUNTER — Ambulatory Visit: Payer: BC Managed Care – PPO

## 2022-04-03 ENCOUNTER — Ambulatory Visit: Payer: BC Managed Care – PPO

## 2022-04-03 ENCOUNTER — Ambulatory Visit: Payer: Self-pay | Admitting: *Deleted

## 2022-04-03 NOTE — Patient Outreach (Signed)
  Care Coordination   Follow Up Visit Note   04/03/2022 Name: Lisa Galvan MRN: [id number] DOB: 1960-08-21  Lisa Galvan is a 61 y.o. year old female who sees Dorcas Carrow, DO for primary care. I spoke with  Lisa Galvan by phone today.  What matters to the patients health and wellness today?  Continue recovering from Covid and identify plan of care for GI/swallowing issues    Goals Addressed             This Visit's Progress    RNCM: Effective Management of Fatique   On track    Care Coordination Interventions: Evaluation of current treatment plan related to fatigue and being tired, also swallowing difficulty and patient's adherence to plan as established by provider Advised patient to keep appointment with radiology for swallowing evaluation Provided education to patient re: covid recovery as she was diagnosed a couple weeks ago with covid. State she is better now Reviewed medications with patient and discussed compliance Provided patient with   educational materials related to ongoing covid recovery Reviewed scheduled/upcoming provider appointments including GI on 12/18 and PCP on 2/14. Active with Outpatient OT 2 times a week Social Work referral for ongoing support and education Discussed plans with patient for ongoing care management follow up and provided patient with direct contact information for care management team Advised patient to discuss cpap use, swallowing difficulties, fatigue and changes in health and well being with provider Screening for signs and symptoms of depression related to chronic disease state  Assessed social determinant of health barriers        RNCM: Fall prevention and Safety   Not on track    Care Coordination Interventions: Provided written and verbal education re: potential causes of falls and Fall prevention strategies.  Using DME to decrease risk of falling Reviewed medications and discussed potential side effects of medications such as  dizziness and frequent urination Advised patient of importance of notifying provider of falls Assessed for falls since last encounter. Last fall was a few weeks ago at church.  She has discussed with provider, they are thinking about enrolling in outpatient PT once she finishes outpatient OT.  Assessed patients knowledge of fall risk prevention secondary to previously provided education. Review with patient to monitor for risk factors and fall risk.  Provided patient information for fall alert systems. Review and patient has a life alert system. She has not set it up yet as she just received. Encouraged her to set up the system, especially since she lives alone. Assessed working status of life alert bracelet and patient adherence Advised patient to discuss fall prevention and safety concerns with provider Patient interviewed about adult health maintenance status including  Falls risk assessment            SDOH assessments and interventions completed:  No     Care Coordination Interventions:  Yes, provided   Follow up plan: Follow up call scheduled for 1/12    Encounter Outcome:  Pt. Visit Completed   Kemper Durie, RN, MSN, [facility] Care Management Care Management Coordinator [phone]

## 2022-04-03 NOTE — Patient Instructions (Signed)
Visit Information  Thank you for taking time to visit with me today. Please don't hesitate to contact me if I can be of assistance to you before our next scheduled telephone appointment.  Following are the goals we discussed today:  Keep appointment with GI on 12/18. Continue using cane and walker  Our next appointment is by telephone on 1/12  Please call the care guide team at [phone] if you need to cancel or reschedule your appointment.   Please call the Suicide and Crisis Lifeline: 988 call the [place] National Suicide Prevention Lifeline: 678-440-8158 or TTY: 925-218-9652 TTY 661-273-9263) to talk to a trained counselor call 1-800-273-TALK (toll free, 24 hour hotline) call 911 if you are experiencing a Mental Health or Behavioral Health Crisis or need someone to talk to.  Patient verbalizes understanding of instructions and care plan provided today and agrees to view in MyChart. Active MyChart status and patient understanding of how to access instructions and care plan via MyChart confirmed with patient.     The patient has been provided with contact information for the care management team and has been advised to call with any health related questions or concerns.   Kemper Durie, RN, MSN, [facility] Care Management Care Management Coordinator [phone]

## 2022-04-03 NOTE — Patient Outreach (Signed)
  Care Coordination   04/03/2022 Name: Keirstyn Aydt MRN: [id number] DOB: 1960/08/27   Care Coordination Outreach Attempts:  An unsuccessful telephone outreach was attempted for a scheduled appointment today.  Follow Up Plan:  Additional outreach attempts will be made to offer the patient care coordination information and services.   Encounter Outcome:  No Answer   Care Coordination Interventions:  No, not indicated    Kemper Durie, RN, MSN, [facility] Care Management Care Management Coordinator [phone]

## 2022-04-04 ENCOUNTER — Ambulatory Visit: Payer: BC Managed Care – PPO

## 2022-04-04 DIAGNOSIS — R278 Other lack of coordination: Secondary | ICD-10-CM | POA: Diagnosis not present

## 2022-04-04 DIAGNOSIS — M6281 Muscle weakness (generalized): Secondary | ICD-10-CM | POA: Diagnosis not present

## 2022-04-04 DIAGNOSIS — G5603 Carpal tunnel syndrome, bilateral upper limbs: Secondary | ICD-10-CM | POA: Diagnosis not present
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
## 2022-04-06 DIAGNOSIS — G4733 Obstructive sleep apnea (adult) (pediatric): Secondary | ICD-10-CM | POA: Diagnosis not present

## 2022-04-09 ENCOUNTER — Ambulatory Visit: Payer: BC Managed Care – PPO | Admitting: Gastroenterology

## 2022-04-09 ENCOUNTER — Encounter: Payer: Self-pay | Admitting: Gastroenterology

## 2022-04-09 ENCOUNTER — Other Ambulatory Visit: Payer: Self-pay

## 2022-04-09 VITALS — BP 135/91 | HR 80 | Temp 98.1°F | Ht 63.0 in | Wt 153.0 lb

## 2022-04-09 DIAGNOSIS — T17308D Unspecified foreign body in larynx causing other injury, subsequent encounter: Secondary | ICD-10-CM | POA: Diagnosis not present

## 2022-04-09 DIAGNOSIS — R1319 Other dysphagia: Secondary | ICD-10-CM

## 2022-04-09 NOTE — Progress Notes (Signed)
Arlyss Repress, MD [address]  [address]  [place], [place] [address]  Main: [phone]  Fax: [phone]    Gastroenterology Consultation  Referring Provider:     Dorcas Carrow, DO Primary Care Physician:  Dorcas Carrow, DO Primary Gastroenterologist:  Dr. Arlyss Repress Reason for Consultation: Choking to liquids, chronic GERD        HPI:   Lisa Galvan is a 61 y.o. female referred by Dr. Laural Benes, Oralia Rud, DO  for consultation & management of choking to liquids.  Patient reports that approximately for last 3 to 4 months, she has been noticing frequent choking episodes to liquids.  She was evaluated by ENT, also underwent speech pathology evaluation with modified barium swallow.  There was no evidence of oropharyngeal dysphagia.  Therefore, she was referred to see GI for further evaluation.  She underwent EGD in 2020 which was unremarkable.  Her weight has been stable.  She continues to take Creon 1 capsule with each meal.  She continues to have irregular bowel movements, taking MiraLAX twice daily.  She does not have any other concerns today.  NSAIDs: None  Antiplts/Anticoagulants/Anti thrombotics: None  GI Procedures:  Colonoscopy ~2013 in [place] EGD ~2011, reportedly normal   EGD 03/12/17: - Normal duodenal bulb and second portion of the duodenum. - Normal stomach. Biopsied. - Normal gastroesophageal junction and esophagus. DIAGNOSIS:  A. STOMACH; RANDOM COLD BIOPSY:  - HELICOBACTER PYLORI-ASSOCIATED GASTRITIS, WITH MILD CHRONIC ACTIVE  INFLAMMATION.  - NEGATIVE FOR INTESTINAL METAPLASIA, ATROPHY, DYSPLASIA, AND  MALIGNANCY.  - H. PYLORI BACTERIA ARE SEEN IN HEMATOXYLIN AND EOSIN SECTIONS.   Upper endoscopy 03/30/2019 - Normal duodenal bulb and second portion of the duodenum. - Erythematous mucosa in the antrum. Biopsied. - Normal cardia, gastric fundus, gastric body, incisura and prepyloric region of the stomach. Biopsied. - Esophagogastric landmarks  identified. - Normal gastroesophageal junction and esophagus.   DIAGNOSIS:  A.  STOMACH; COLD BIOPSY:  - ANTRAL AND OXYNTIC MUCOSA WITH MILD CHRONIC INACTIVE GASTRITIS.  - OXYNTIC MUCOSA WITH PROTON PUMP INHIBITOR EFFECT.  - NEGATIVE FOR INTESTINAL METAPLASIA, DYSPLASIA, AND MALIGNANCY.    Colonoscopy 03/30/2019 - The examined portion of the ileum was normal. - The entire examined colon is normal. - Non-bleeding external hemorrhoids. - No specimens collected.   Denies family history of esophageal cancer, stomach cancer, colon cancer  Past Medical History:  Diagnosis Date   Allergy    Anemia    Arthritis    spine   Bilateral leg edema    Chest pain 12/15/2014   Chronic sciatica 12/10/2014   Essential hypertension 12/10/2014   Gastroesophageal reflux disease without esophagitis    Mixed hyperlipidemia    Neuropathy    Obesity (BMI 30.0-34.9) 12/10/2014   Sleep apnea    waiting for cpap   Type 2 diabetes mellitus without complication (HCC) 12/10/2014   Umbilical hernia     Past Surgical History:  Procedure Laterality Date   COLONOSCOPY WITH PROPOFOL N/A 03/30/2019   Procedure: COLONOSCOPY WITH PROPOFOL;  Surgeon: Toney Reil, MD;  Location: [facility] END[facility];  Service: Gastroenterology;  Laterality: N/A;   ESOPHAGOGASTRODUODENOSCOPY (EGD) WITH PROPOFOL N/A 03/12/2017   Procedure: ESOPHAGOGASTRODUODENOSCOPY (EGD) WITH PROPOFOL;  Surgeon: Toney Reil, MD;  Location: [facility] ENDOSCOPY;  Service: Gastroenterology;  Laterality: N/A;   ESOPHAGOGASTRODUODENOSCOPY (EGD) WITH PROPOFOL N/A 03/30/2019   Procedure: ESOPHAGOGASTRODUODENOSCOPY (EGD) WITH PROPOFOL;  Surgeon: Toney Reil, MD;  Location: [facility] ENDOSCOPY;  Service: Gastroenterology;  Laterality: N/A;  HERNIA REPAIR     INCISIONAL HERNIA REPAIR N/A 11/29/2020   Procedure: HERNIA REPAIR INCISIONAL, open;  Surgeon: Leafy Ro, MD;  Location: [facility] ORS;  Service: General;  Laterality: N/A;   ORIF ANKLE  FRACTURE Left 07/30/2019   Procedure: OPEN REDUCTION INTERNAL FIXATION (ORIF) OF LEFT BIMALLEOLAR ANKLE FRACTURE;  Surgeon: Signa Kell, MD;  Location: [facility];  Service: Orthopedics;  Laterality: Left;  Diabetic - oral meds   SUPRACERVICAL ABDOMINAL HYSTERECTOMY     UMBILICAL HERNIA REPAIR N/A 05/01/2017   Procedure: HERNIA REPAIR UMBILICAL ADULT;  Surgeon: Ancil Linsey, MD;  Location: [facility];  Service: General;  Laterality: N/A;   XI ROBOTIC ASSISTED VENTRAL HERNIA N/A 10/06/2019   Procedure: XI ROBOTIC ASSISTED VENTRAL HERNIA;  Surgeon: Leafy Ro, MD;  Location: [facility];  Service: General;  Laterality: N/A;     Current Outpatient Medications:    Accu-Chek Softclix Lancets lancets, TEST TWICE DAILY, Disp: 100 each, Rfl: 12   aspirin EC 81 MG tablet, Take 81 mg by mouth daily. Swallow whole., Disp: , Rfl:    atorvastatin (LIPITOR) 20 MG tablet, Take 1 tablet (20 mg total) by mouth daily. TAKE 1 TABLET(20 MG) BY MOUTH DAILY, Disp: 90 tablet, Rfl: 1   Cholecalciferol (VITAMIN D-3) 5000 units TABS, Take 5,000 Units by mouth daily., Disp: , Rfl:    DULoxetine (CYMBALTA) 30 MG capsule, Take 1 capsule (30 mg total) by mouth daily. (Take with 60mg  for a total dose of 90mg ), Disp: 90 capsule, Rfl: 1   DULoxetine (CYMBALTA) 60 MG capsule, Take 1 capsule (60 mg total) by mouth daily. (Take with 30mg  for total dose of 90mg ), Disp: 90 capsule, Rfl: 1   EPINEPHrine 0.3 mg/0.3 mL IJ SOAJ injection, Inject 0.3 mg into the muscle as needed for anaphylaxis., Disp: 1 each, Rfl: 12   fexofenadine (ALLERGY RELIEF) 180 MG tablet, Take 1 tablet (180 mg total) by mouth daily., Disp: 90 tablet, Rfl: 3   gabapentin (NEURONTIN) 300 MG capsule, TAKE 2 CAPSULES(600 MG) BY MOUTH at BEDTIME and 1 CAPSULE (300MG ) In the AM and PM, Disp: 360 capsule, Rfl: 1   ibuprofen (ADVIL) 800 MG tablet, Take 1 tablet (800 mg total) by mouth every 8 (eight) hours as needed., Disp: 90 tablet, Rfl: 3   ketoconazole  (NIZORAL) 2 % cream, APPLY TOPICALLY TO THE AFFECTED AREA DAILY AS NEEDED FOR IRRITATION, Disp: 60 g, Rfl: 1   Magnesium 500 MG TABS, Take 500 mg by mouth daily., Disp: , Rfl:    magnesium gluconate (MAGONATE) 500 MG tablet, Take 500 mg by mouth 2 (two) times daily., Disp: , Rfl:    metFORMIN (GLUCOPHAGE) 500 MG tablet, Take 2 tablets (1,000 mg total) by mouth 2 (two) times daily with a meal., Disp: 360 tablet, Rfl: 1   montelukast (SINGULAIR) 10 MG tablet, Take 1 tablet (10 mg total) by mouth at bedtime. TAKE 1 TABLET(10 MG) BY MOUTH AT BEDTIME, Disp: 90 tablet, Rfl: 1   omeprazole (PRILOSEC) 40 MG capsule, TAKE 1 CAPSULE(40 MG) BY MOUTH BID, Disp: 180 capsule, Rfl: 1   potassium chloride (KLOR-CON M) 10 MEQ tablet, TAKE 3 TABLETS(30 MEQ) BY MOUTH DAILY, Disp: 270 tablet, Rfl: 1   sucralfate (CARAFATE) 1 g tablet, Take 1 tablet (1 g total) by mouth 4 (four) times daily -  with meals and at bedtime., Disp: 360 tablet, Rfl: 1   vitamin B-12 (CYANOCOBALAMIN) 1000 MCG tablet, Take 1,000 mcg by mouth 2 (two) times daily., Disp: ,  Rfl:    Family History  Problem Relation Age of Onset   Diabetes Mother    Cancer Mother        breast and stomach   Breast cancer Mother 5   Dementia Mother    Heart disease Father    Kidney disease Father        dialysis   Cancer Father        colon   Diabetes Father    Hypertension Sister    Hypertension Brother    Hypertension Daughter    Congestive Heart Failure Maternal Grandmother    Diabetes Maternal Grandmother    Hypertension Maternal Grandmother      Social History   Tobacco Use   Smoking status: Never   Smokeless tobacco: Never  Vaping Use   Vaping Use: Never used  Substance Use Topics   Alcohol use: Yes    Alcohol/week: 3.0 standard drinks of alcohol    Types: 3 Glasses of wine per week   Drug use: No    Allergies as of 04/09/2022 - Review Complete 04/09/2022  Allergen Reaction Noted   Benazepril Swelling 10/07/2020   Ivp dye  [iodinated contrast media] Anaphylaxis 12/02/2018   Lidocaine Rash 02/08/2021   Lyrica [pregabalin] Swelling 07/15/2020   Shellfish allergy Anaphylaxis and Swelling 12/15/2014   Shellfish-derived products Anaphylaxis 12/15/2014   Trulicity [dulaglutide] Swelling 09/13/2020   Ace inhibitors Swelling 06/05/2016    Review of Systems:    All systems reviewed and negative except where noted in HPI.   Physical Exam:  BP (!) 135/91 (BP Location: Left Arm, Patient Position: Sitting, Cuff Size: Normal)   Pulse 80   Temp 98.1 F (36.7 C) (Oral)   Ht 5\' 3"  (1.6 m)   Wt 153 lb (69.4 kg)   BMI 27.10 kg/m  No LMP recorded. Patient has had a hysterectomy.  General:   Alert,  Well-developed, well-nourished, pleasant and cooperative in NAD Head:  Normocephalic and atraumatic. Eyes:  Sclera clear, no icterus.   Conjunctiva pink. Ears:  Normal auditory acuity. Nose:  No deformity, discharge, or lesions. Mouth:  No deformity or lesions,oropharynx pink & moist. Neck:  Supple; no masses or thyromegaly. Lungs:  Respirations even and unlabored.  Clear throughout to auscultation.   No wheezes, crackles, or rhonchi. No acute distress. Heart:  Regular rate and rhythm; no murmurs, clicks, rubs, or gallops. Abdomen:  Normal bowel sounds. Soft, non-tender and non-distended without masses, hepatosplenomegaly or hernias noted.  No guarding or rebound tenderness.   Rectal: Not performed Msk:  Symmetrical without gross deformities. Good, equal movement & strength bilaterally. Pulses:  Normal pulses noted. Extremities:  No clubbing or edema.  No cyanosis. Neurologic:  Alert and oriented x3;  grossly normal neurologically. Skin:  Intact without significant lesions or rashes. No jaundice. Lymph Nodes:  No significant cervical adenopathy. Psych:  Alert and cooperative. Normal mood and affect.  Imaging Studies: Reviewed  Assessment and Plan:   Lisa Galvan is a 61 y.o. female with history of chronic GERD  without erosive esophagitis, history of H. pylori infection s/p triple therapy, confirmed eradication.  History of ventral hernia repair and incisional hernia repair.  History of pancreatic atrophy with fatty pancreas and exocrine pancreatic insufficiency based on low pancreatic fecal elastase levels.  Patient is now seen in consultation for choking episodes to liquids   Recommend EGD with proximal and distal esophageal biopsies.  If this is unremarkable, recommend esophageal manometry to evaluate for esophageal dysmotility Continue Prilosec 40 mg  p.o. twice daily  EPI Currently suffering from constipation Advised patient to decrease Creon with 1-2 meals per day MiraLAX 34 g daily  Follow up based on the above workup   Arlyss Repressohini R Kamarah Bilotta, MD

## 2022-04-10 ENCOUNTER — Ambulatory Visit: Payer: BC Managed Care – PPO

## 2022-04-11 ENCOUNTER — Ambulatory Visit: Payer: BC Managed Care – PPO

## 2022-04-11 DIAGNOSIS — Z7984 Long term (current) use of oral hypoglycemic drugs: Secondary | ICD-10-CM | POA: Diagnosis not present

## 2022-04-11 DIAGNOSIS — Z79899 Other long term (current) drug therapy: Secondary | ICD-10-CM | POA: Diagnosis not present

## 2022-04-11 DIAGNOSIS — S90521A Blister (nonthermal), right ankle, initial encounter: Secondary | ICD-10-CM | POA: Diagnosis not present

## 2022-04-11 DIAGNOSIS — Z01818 Encounter for other preprocedural examination: Secondary | ICD-10-CM | POA: Diagnosis not present

## 2022-04-11 DIAGNOSIS — M6281 Muscle weakness (generalized): Secondary | ICD-10-CM | POA: Diagnosis not present

## 2022-04-11 DIAGNOSIS — Z5309 Procedure and treatment not carried out because of other contraindication: Secondary | ICD-10-CM | POA: Diagnosis not present

## 2022-04-11 DIAGNOSIS — E1142 Type 2 diabetes mellitus with diabetic polyneuropathy: Secondary | ICD-10-CM | POA: Diagnosis not present

## 2022-04-11 DIAGNOSIS — M79604 Pain in right leg: Secondary | ICD-10-CM | POA: Diagnosis not present

## 2022-04-11 DIAGNOSIS — S82841A Displaced bimalleolar fracture of right lower leg, initial encounter for closed fracture: Secondary | ICD-10-CM | POA: Diagnosis not present

## 2022-04-11 DIAGNOSIS — E119 Type 2 diabetes mellitus without complications: Secondary | ICD-10-CM | POA: Diagnosis not present

## 2022-04-11 DIAGNOSIS — S8291XA Unspecified fracture of right lower leg, initial encounter for closed fracture: Secondary | ICD-10-CM | POA: Diagnosis not present

## 2022-04-11 DIAGNOSIS — E669 Obesity, unspecified: Secondary | ICD-10-CM | POA: Diagnosis not present

## 2022-04-11 DIAGNOSIS — S99911A Unspecified injury of right ankle, initial encounter: Secondary | ICD-10-CM | POA: Diagnosis not present

## 2022-04-11 DIAGNOSIS — R600 Localized edema: Secondary | ICD-10-CM | POA: Diagnosis not present

## 2022-04-11 DIAGNOSIS — G4730 Sleep apnea, unspecified: Secondary | ICD-10-CM | POA: Diagnosis not present

## 2022-04-11 DIAGNOSIS — W010XXA Fall on same level from slipping, tripping and stumbling without subsequent striking against object, initial encounter: Secondary | ICD-10-CM | POA: Diagnosis not present

## 2022-04-11 DIAGNOSIS — I10 Essential (primary) hypertension: Secondary | ICD-10-CM | POA: Diagnosis not present

## 2022-04-11 DIAGNOSIS — S82899A Other fracture of unspecified lower leg, initial encounter for closed fracture: Secondary | ICD-10-CM | POA: Diagnosis not present

## 2022-04-11 DIAGNOSIS — S82831A Other fracture of upper and lower end of right fibula, initial encounter for closed fracture: Secondary | ICD-10-CM | POA: Diagnosis not present

## 2022-04-11 DIAGNOSIS — K219 Gastro-esophageal reflux disease without esophagitis: Secondary | ICD-10-CM | POA: Diagnosis not present

## 2022-04-11 DIAGNOSIS — G4733 Obstructive sleep apnea (adult) (pediatric): Secondary | ICD-10-CM | POA: Diagnosis not present

## 2022-04-11 DIAGNOSIS — Z6829 Body mass index (BMI) 29.0-29.9, adult: Secondary | ICD-10-CM | POA: Diagnosis not present

## 2022-04-11 DIAGNOSIS — M5430 Sciatica, unspecified side: Secondary | ICD-10-CM | POA: Diagnosis not present

## 2022-04-11 DIAGNOSIS — G5603 Carpal tunnel syndrome, bilateral upper limbs: Secondary | ICD-10-CM

## 2022-04-11 DIAGNOSIS — S82851A Displaced trimalleolar fracture of right lower leg, initial encounter for closed fracture: Secondary | ICD-10-CM | POA: Diagnosis not present

## 2022-04-11 DIAGNOSIS — E785 Hyperlipidemia, unspecified: Secondary | ICD-10-CM | POA: Diagnosis not present

## 2022-04-11 DIAGNOSIS — R278 Other lack of coordination: Secondary | ICD-10-CM
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
## 2022-04-13 ENCOUNTER — Ambulatory Visit: Payer: BC Managed Care – PPO

## 2022-04-13 ENCOUNTER — Encounter: Payer: Self-pay | Admitting: Gastroenterology

## 2022-04-14 ENCOUNTER — Inpatient Hospital Stay: Payer: BC Managed Care – PPO

## 2022-04-14 ENCOUNTER — Emergency Department: Payer: BC Managed Care – PPO

## 2022-04-14 ENCOUNTER — Inpatient Hospital Stay
Admission: EM | Admit: 2022-04-14 | Discharge: 2022-04-17 | DRG: 563 | Disposition: A | Discharge status: Home / Self Care | Payer: BC Managed Care – PPO | Attending: Hospitalist | Admitting: Hospitalist

## 2022-04-14 ENCOUNTER — Other Ambulatory Visit: Payer: Self-pay

## 2022-04-14 DIAGNOSIS — G4733 Obstructive sleep apnea (adult) (pediatric): Secondary | ICD-10-CM | POA: Diagnosis present

## 2022-04-14 DIAGNOSIS — S82851A Displaced trimalleolar fracture of right lower leg, initial encounter for closed fracture: Secondary | ICD-10-CM | POA: Diagnosis not present

## 2022-04-14 DIAGNOSIS — S90521A Blister (nonthermal), right ankle, initial encounter: Secondary | ICD-10-CM | POA: Diagnosis present

## 2022-04-14 DIAGNOSIS — E785 Hyperlipidemia, unspecified: Secondary | ICD-10-CM | POA: Diagnosis present

## 2022-04-14 DIAGNOSIS — Z6829 Body mass index (BMI) 29.0-29.9, adult: Secondary | ICD-10-CM

## 2022-04-14 DIAGNOSIS — E669 Obesity, unspecified: Secondary | ICD-10-CM | POA: Diagnosis present

## 2022-04-14 DIAGNOSIS — E1142 Type 2 diabetes mellitus with diabetic polyneuropathy: Secondary | ICD-10-CM | POA: Diagnosis present

## 2022-04-14 DIAGNOSIS — E119 Type 2 diabetes mellitus without complications: Secondary | ICD-10-CM

## 2022-04-14 DIAGNOSIS — Z7984 Long term (current) use of oral hypoglycemic drugs: Secondary | ICD-10-CM

## 2022-04-14 DIAGNOSIS — M792 Neuralgia and neuritis, unspecified: Secondary | ICD-10-CM

## 2022-04-14 DIAGNOSIS — Z79899 Other long term (current) drug therapy: Secondary | ICD-10-CM | POA: Diagnosis not present

## 2022-04-14 DIAGNOSIS — Z5309 Procedure and treatment not carried out because of other contraindication: Secondary | ICD-10-CM | POA: Diagnosis present

## 2022-04-14 DIAGNOSIS — I10 Essential (primary) hypertension: Secondary | ICD-10-CM | POA: Diagnosis present

## 2022-04-14 DIAGNOSIS — W010XXA Fall on same level from slipping, tripping and stumbling without subsequent striking against object, initial encounter: Secondary | ICD-10-CM | POA: Diagnosis present

## 2022-04-14 DIAGNOSIS — S82899A Other fracture of unspecified lower leg, initial encounter for closed fracture: Secondary | ICD-10-CM | POA: Diagnosis present

## 2022-04-14 DIAGNOSIS — K219 Gastro-esophageal reflux disease without esophagitis: Secondary | ICD-10-CM | POA: Diagnosis present

## 2022-04-14 DIAGNOSIS — M5430 Sciatica, unspecified side: Secondary | ICD-10-CM | POA: Diagnosis present

## 2022-04-14 LAB — COMPREHENSIVE METABOLIC PANEL
ALT: 14 U/L (ref 0–44)
AST: 22 U/L (ref 15–41)
Albumin: 4.2 g/dL (ref 3.5–5.0)
Alkaline Phosphatase: 58 U/L (ref 38–126)
Anion gap: 9 (ref 5–15)
BUN: 6 mg/dL — ABNORMAL LOW (ref 8–23)
CO2: 26 mmol/L (ref 22–32)
Calcium: 9.4 mg/dL (ref 8.9–10.3)
Chloride: 102 mmol/L (ref 98–111)
Creatinine, Ser: 0.67 mg/dL (ref 0.44–1.00)
GFR, Estimated: >60 mL/min (ref >60)
Glucose, Bld: 145 mg/dL — ABNORMAL HIGH (ref 70–99)
Potassium: 3.8 mmol/L (ref 3.5–5.1)
Sodium: 137 mmol/L (ref 135–145)
Total Bilirubin: 1.1 mg/dL (ref 0.3–1.2)
Total Protein: 7.2 g/dL (ref 6.5–8.1)

## 2022-04-14 LAB — CBC WITH DIFFERENTIAL/PLATELET
Abs Immature Granulocytes: 0.03 10*3/uL (ref 0.00–0.07)
Basophils Absolute: 0 10*3/uL (ref 0.0–0.1)
Basophils Relative: 0 %
Eosinophils Absolute: 0.1 10*3/uL (ref 0.0–0.5)
Eosinophils Relative: 1 %
HCT: 38.3 % (ref 36.0–46.0)
Hemoglobin: 12.9 g/dL (ref 12.0–15.0)
Immature Granulocytes: 0 %
Lymphocytes Relative: 34 %
Lymphs Abs: 2.6 10*3/uL (ref 0.7–4.0)
MCH: 28.1 pg (ref 26.0–34.0)
MCHC: 33.7 g/dL (ref 30.0–36.0)
MCV: 83.4 fL (ref 80.0–100.0)
Monocytes Absolute: 0.3 10*3/uL (ref 0.1–1.0)
Monocytes Relative: 4 %
Neutro Abs: 4.6 10*3/uL (ref 1.7–7.7)
Neutrophils Relative %: 61 %
Platelets: 272 10*3/uL (ref 150–400)
RBC: 4.59 MIL/uL (ref 3.87–5.11)
RDW: 14.5 % (ref 11.5–15.5)
WBC: 7.6 10*3/uL (ref 4.0–10.5)
nRBC: 0 % (ref 0.0–0.2)

## 2022-04-14 MED ORDER — ONDANSETRON HCL 4 MG/2ML IJ SOLN
4.0000 mg | Freq: Four times a day (QID) | INTRAMUSCULAR | Status: DC | PRN
  Administered 2022-04-15: 4 mg via INTRAVENOUS
  Filled 2022-04-14: qty 2

## 2022-04-14 MED ORDER — GABAPENTIN 300 MG PO CAPS
600.0000 mg | ORAL_CAPSULE | Freq: Every day | ORAL | Status: DC
  Administered 2022-04-15 – 2022-04-16 (×3): 600 mg via ORAL
  Filled 2022-04-14 (×3): qty 2

## 2022-04-14 MED ORDER — ACETAMINOPHEN 650 MG RE SUPP: 650.0000 mg | Freq: Four times a day (QID) | RECTAL | Status: DC | PRN

## 2022-04-14 MED ORDER — SODIUM CHLORIDE 0.9 % IV SOLN: INTRAVENOUS | Status: DC

## 2022-04-14 MED ORDER — METHOCARBAMOL 1000 MG/10ML IJ SOLN: 500.0000 mg | Freq: Four times a day (QID) | INTRAVENOUS | Status: DC | PRN

## 2022-04-14 MED ORDER — PANTOPRAZOLE SODIUM 40 MG PO TBEC
40.0000 mg | DELAYED_RELEASE_TABLET | Freq: Every day | ORAL | Status: DC
  Administered 2022-04-15 – 2022-04-17 (×3): 40 mg via ORAL
  Filled 2022-04-14 (×3): qty 1

## 2022-04-14 MED ORDER — GABAPENTIN 300 MG PO CAPS
300.0000 mg | ORAL_CAPSULE | Freq: Every morning | ORAL | Status: DC
  Administered 2022-04-15 – 2022-04-17 (×3): 300 mg via ORAL
  Filled 2022-04-14 (×3): qty 1

## 2022-04-14 MED ORDER — ENOXAPARIN SODIUM 40 MG/0.4ML IJ SOSY
40.0000 mg | PREFILLED_SYRINGE | INTRAMUSCULAR | Status: DC
  Administered 2022-04-16 – 2022-04-17 (×2): 40 mg via SUBCUTANEOUS
  Filled 2022-04-14 (×3): qty 0.4

## 2022-04-14 MED ORDER — INSULIN ASPART 100 UNIT/ML IJ SOLN: 0.0000 [IU] | INTRAMUSCULAR | Status: DC

## 2022-04-14 MED ORDER — SODIUM CHLORIDE 0.9 % IV BOLUS
1000.0000 mL | Freq: Once | INTRAVENOUS | Status: AC
  Administered 2022-04-14: 1000 mL via INTRAVENOUS

## 2022-04-14 MED ORDER — OXYCODONE-ACETAMINOPHEN 5-325 MG PO TABS
1.0000 | ORAL_TABLET | Freq: Once | ORAL | Status: AC
  Administered 2022-04-14: 1 via ORAL
  Filled 2022-04-14: qty 1

## 2022-04-14 MED ORDER — HYDROMORPHONE HCL 1 MG/ML IJ SOLN
1.0000 mg | INTRAMUSCULAR | Status: DC | PRN
  Administered 2022-04-15: 1 mg via INTRAVENOUS
  Filled 2022-04-14: qty 1

## 2022-04-14 MED ORDER — OXYCODONE HCL 5 MG PO TABS: 5.0000 mg | ORAL_TABLET | ORAL | Status: DC | PRN

## 2022-04-14 MED ORDER — ONDANSETRON HCL 4 MG/2ML IJ SOLN
4.0000 mg | Freq: Once | INTRAMUSCULAR | Status: AC
  Administered 2022-04-14: 4 mg via INTRAVENOUS
  Filled 2022-04-14: qty 2

## 2022-04-14 MED ORDER — ATORVASTATIN CALCIUM 20 MG PO TABS
20.0000 mg | ORAL_TABLET | Freq: Every day | ORAL | Status: DC
  Administered 2022-04-16: 20 mg via ORAL
  Filled 2022-04-14 (×3): qty 1

## 2022-04-14 MED ORDER — FENTANYL CITRATE PF 50 MCG/ML IJ SOSY
100.0000 ug | PREFILLED_SYRINGE | Freq: Once | INTRAMUSCULAR | Status: AC
  Administered 2022-04-14: 100 ug via INTRAVENOUS
  Filled 2022-04-14: qty 2

## 2022-04-14 MED ORDER — DIPHENHYDRAMINE HCL 50 MG/ML IJ SOLN: 25.0000 mg | Freq: Four times a day (QID) | INTRAMUSCULAR | Status: DC | PRN

## 2022-04-14 MED ORDER — ACETAMINOPHEN 325 MG PO TABS
650.0000 mg | ORAL_TABLET | Freq: Four times a day (QID) | ORAL | Status: DC | PRN
  Administered 2022-04-15 – 2022-04-17 (×3): 650 mg via ORAL
  Filled 2022-04-14 (×3): qty 2

## 2022-04-14 MED ORDER — ASPIRIN 81 MG PO TBEC
81.0000 mg | DELAYED_RELEASE_TABLET | Freq: Every day | ORAL | Status: DC
  Administered 2022-04-16 – 2022-04-17 (×2): 81 mg via ORAL
  Filled 2022-04-14 (×3): qty 1

## 2022-04-14 NOTE — ED Provider Notes (Signed)
[facility] Provider Note  Patient Contact: 10:56 PM (approximate)   History   Fall and Foot Injury   HPI  Lisa Galvan is a 61 y.o. female who presents the emergency department complaining of a right ankle injury.  Patient was walking down the hallway of her house when she caught her foot on the runner in the hallway.  Patient sustained an ankle injury with obvious deformity to the ankle joint.  Patient states that when she first visualize her ankle her foot was completely sideways in regards to her lower leg.  Patient states that she reached down, manually adjusted her ankle into better anatomic alignment.  Patient arrived with a walking boot in place though she was not bearing weight.  His walking boot was due to an injury to the left lower extremity previously.  I saw the patient in triage, placed orders.  Patient has a fracture of the ankle joint with subluxation of the distal tibia off the talar dome.  Patient sustained no other injuries and has no other complaints at this time.     Physical Exam   Triage Vital Signs: ED Triage Vitals  Enc Vitals Group     BP 04/14/22 1930 (!) 131/90     Pulse Rate 04/14/22 1930 (!) 109     Resp 04/14/22 1930 (!) 32     Temp 04/14/22 1930 98 F (36.7 C)     Temp Source 04/14/22 1930 Oral     SpO2 04/14/22 1930 100 %     Weight 04/14/22 1931 151 lb (68.5 kg)     Height 04/14/22 1931 5' (1.524 m)     Head Circumference --      Peak Flow --      Pain Score 04/14/22 1931 10     Pain Loc --      Pain Edu? --      Excl. in GC? --     Most recent vital signs: Vitals:   04/14/22 1930  BP: (!) 131/90  Pulse: (!) 109  Resp: (!) 32  Temp: 98 F (36.7 C)  SpO2: 100%     General: Alert and in no acute distress Head: No acute traumatic findings  Neck: No stridor. No cervical spine tenderness to palpation.  Cardiovascular:  Good peripheral perfusion Respiratory: Normal respiratory effort without tachypnea or  retractions. Lungs CTAB.  Musculoskeletal: Visualization of the ankle joint reveals obvious deformity.  Patient has no open wounds.  Patient has her foot canted slightly to the right with what appears to be fracture deformity of the medial malleolus with medial displacement.  Pulse and sensation intact. Neurologic:  No gross focal neurologic deficits are appreciated.  Skin:   No rash noted Other:   ED Results / Procedures / Treatments   Labs (all labs ordered are listed, but only abnormal results are displayed) Labs Reviewed  COMPREHENSIVE METABOLIC PANEL - Abnormal; Notable for the following components:      Result Value   Glucose, Bld 145 (*)    BUN 6 (*)    All other components within normal limits  CBC WITH DIFFERENTIAL/PLATELET     EKG     RADIOLOGY  I personally viewed, evaluated, and interpreted these images as part of my medical decision making, as well as reviewing the written report by the radiologist.  ED Provider Interpretation: Patient with what appears to be a trimalleolar fracture on x-ray with subluxation of the distal tibia off the talar dome.  Postreduction  CT reveals better alignment of fracture fragments and does confirm trimalleolar fracture.  CT Ankle Right Wo Contrast  Result Date: 04/14/2022 CLINICAL DATA:  Right ankle fracture dislocation. EXAM: CT OF THE RIGHT ANKLE WITHOUT CONTRAST TECHNIQUE: Multidetector CT imaging of the right ankle was performed according to the standard protocol. Multiplanar CT image reconstructions were also generated. RADIATION DOSE REDUCTION: This exam was performed according to the departmental dose-optimization program which includes automated exposure control, adjustment of the mA and/or kV according to patient size and/or use of iterative reconstruction technique. COMPARISON:  Right ankle radiographs 7:51 p.m. FINDINGS: Bones/Joint/Cartilage Tibiotalar alignment has been restored since prior examination. Trimalleolar right  ankle fracture identified. Oblique, comminuted fracture of the distal fibular metaphyseal region extending to the level of the tibial plafond has been partially reduced now in near anatomic alignment with approximately 5 mm lateral displacement and 5 mm override of the major distal fracture fragment. Medial malleolar fracture again identified with mild comminution. Distal fracture fragment demonstrates near anatomic alignment and 2 mm lateral displacement of the distal fracture fragment. Posterior malleolar fracture fragment identified comprising 10-15% of the articular surface. Fracture fragment is in near anatomic alignment with a 3 mm articular gap within the a distal tibial articular surface. 8 mm ossific fracture fragment seen anterior to the tibiotalar articulation arising from the a anterior articular surface of the distal tibia. Fracture fragment comprises less than 10% of the distal tibial articular surface. There is, additionally, cortical irregularity involving the superolateral periarticular aspect of the talar dome best seen on axial image # 134/4 and coronal image # 62/5 in keeping with a probable infraction fracture. Ligaments Suboptimally assessed by CT. Muscles and Tendons Calcification involving the peroneus brevis tendon at the level of the crossing peroneal longus tendon may relate to remote trauma or inflammation. No other abnormality seen. Soft tissues Extensive subcutaneous edema and or interstitial hemorrhage. No loculated subcutaneous fluid collection identified. IMPRESSION: 1. Trimalleolar right ankle fracture as described above. Tibiotalar alignment has been restored since prior examination. 2. Cortical irregularity involving the superolateral periarticular aspect of the talar dome in keeping with a probable infraction fracture. 3. Extensive subcutaneous edema and or interstitial hemorrhage. No loculated subcutaneous fluid collection identified. 4. Calcification involving the peroneus  brevis tendon at the level of the crossing peroneal longus tendon may relate to remote trauma or inflammation. Electronically Signed   By: Helyn Numbers M.D.   On: 04/14/2022 22:54   DG Foot Complete Right  Result Date: 04/14/2022 CLINICAL DATA:  Fall, right ankle injury EXAM: RIGHT FOOT COMPLETE - 3+ VIEW COMPARISON:  None Available. FINDINGS: Comminuted bimalleolar fracture of the right ankle with posterolateral tibiotalar dislocation is better assessed on accompanying radiographs of the right ankle. Osseous structures of the right foot are intact. No fracture or dislocation involving the right foot. Joint spaces are preserved within the right foot. Tiny plantar calcaneal spur. Soft tissues of the right foot are unremarkable. IMPRESSION: 1. Comminuted bimalleolar fracture of the right ankle with posterolateral tibiotalar dislocation. 2. No fracture or dislocation of the right foot. Electronically Signed   By: Helyn Numbers M.D.   On: 04/14/2022 20:16   DG Ankle Complete Right  Result Date: 04/14/2022 CLINICAL DATA:  Fall, right ankle injury EXAM: RIGHT ANKLE - COMPLETE 3+ VIEW COMPARISON:  None Available. FINDINGS: Acute bimalleolar fracture dislocation of the right ankle. Oblique fracture of the distal right fibular metadiaphysis to the level of the tibial plafond with 1/2 shaft with lateral and 1 shaft  with posterior displacement and moderate posterior angulation of the distal fracture fragment. Transverse, avulsive type fracture of the medial malleolus at the level of the tibial plafond with 8 mm medial displacement of the distal fracture fragment. Acute intra-articular fracture of the anterior articular aspect of the distal tibia with approximately 6-7 mm posterior displacement of the distal fracture fragment into the joint space. Fracture fragment comprises less than 10% of the articular surface. Posterolateral dislocation of the talar dome in relation to the tibial plafond. Extensive bimalleolar  soft tissue swelling. IMPRESSION: 1. Acute bimalleolar fracture dislocation of the right ankle as described above. Electronically Signed   By: Helyn NumbersAshesh  Parikh M.D.   On: 04/14/2022 20:15    PROCEDURES:  Critical Care performed: No  Reduction of fracture  Date/Time: 04/14/2022 11:16 PM  Performed by: Racheal Patchesuthriell, Josha Weekley D, PA-C Authorized by: Racheal Patchesuthriell, Kamon Fahr D, PA-C  Consent: Verbal consent obtained. Risks and benefits: risks, benefits and alternatives were discussed Consent given by: patient Imaging studies: imaging studies available Required items: required blood products, implants, devices, and special equipment available Patient identity confirmed: verbally with patient Time out: Immediately prior to procedure a "time out" was called to verify the correct patient, procedure, equipment, support staff and site/side marked as required. Local anesthesia used: no  Anesthesia: Local anesthesia used: no  Sedation: Patient sedated: no  Patient tolerance: patient tolerated the procedure well with no immediate complications Comments: Patient was given 100 mcg of fentanyl prior to reduction of fracture.  She was not sedated and no local anesthetic use.  Using traction along the ankle joint, traction over the heel with lifting of the great toe was performed.  This improved anatomic alignment slightly.  Using manual manipulation, patient's medial malleolus is reduced laterally for better anatomical alignment.  Patient has significant improvement in visible and palpable alignment of fracture.  Patient is splinted in this position.  Pulses, sensation present both prior to and status post procedure.      MEDICATIONS ORDERED IN ED: Medications  oxyCODONE-acetaminophen (PERCOCET/ROXICET) 5-325 MG per tablet 1 tablet (1 tablet Oral Given 04/14/22 1942)  sodium chloride 0.9 % bolus 1,000 mL (1,000 mLs Intravenous New Bag/Given 04/14/22 2159)  fentaNYL (SUBLIMAZE) injection 100 mcg (100 mcg  Intravenous Given 04/14/22 2150)  ondansetron (ZOFRAN) injection 4 mg (4 mg Intravenous Given 04/14/22 2158)     IMPRESSION / MDM / ASSESSMENT AND PLAN / ED COURSE  I reviewed the triage vital signs and the nursing notes.                              Differential diagnosis includes, but is not limited to, ankle fracture, ankle dislocation, ankle sprain  Patient's presentation is most consistent with acute presentation with potential threat to life or bodily function.   Patient's diagnosis is consistent with trimalleolar fracture with subluxation of the ankle joint.  Patient presented to the emergency department with a obvious deformity to the right ankle.  Patient had injured her ankle and did fall but sustained no other injuries in the fall.  Patient had no other complaints.  Patient arrived and was seen by myself in triage.  Orders placed out of triage which revealed a fracture of the ankle joint and subluxation of the distal tibia off the talar dome.  Patient was roomed at this time..  Patient had reduction of fracture performed by myself in the room, ankle splinted.  Postreduction CT for surgical planning.  I  discussed the case with on-call podiatrist, Dr. Lilian Kapur.  Podiatry recommends admission for surgical repair tomorrow.  Patient will have elevation of the lower extremity with ice behind the knee.  Remain in splint.  Patient is admitted to the hospitalist service at this time.Marland Kitchen        FINAL CLINICAL IMPRESSION(S) / ED DIAGNOSES   Final diagnoses:  Closed trimalleolar fracture of right ankle, initial encounter     Rx / DC Orders   ED Discharge Orders     None        Note:  This document was prepared using Dragon voice recognition software and may include unintentional dictation errors.   Lanette Hampshire 04/14/22 2318    Pilar Jarvis, MD 04/16/22 [phone]

## 2022-04-14 NOTE — ED Triage Notes (Signed)
Pt arrives via POV with family member with CC of R foot pain. Pt tripped and "right foot went sideways". Hole cut in toe of socks to visualize toes. 5 Ps of perfusion intact at this time. Pt alert and oriented at this time.

## 2022-04-14 NOTE — ED Provider Triage Note (Signed)
Emergency Medicine Provider Triage Evaluation Note  Lisa Galvan , a 61 y.o. female  was evaluated in triage.  Pt complains of ankle injury. Walking when ankle caught. Patient states foot was sideways and she physically straightened foot out. In boot from injury to other ankle. No other injury or complaint  Review of Systems  Positive: Ankle injury Negative: Other MSK injury  Physical Exam  BP (!) 131/90 (BP Location: Left Arm)   Pulse (!) 109   Temp 98 F (36.7 C) (Oral)   Resp (!) 32   Ht 5' (1.524 m)   Wt 68.5 kg   SpO2 100%   BMI 29.49 kg/m  Gen:   Awake, no distress   Resp:  Normal effort  MSK:   R ankle in boot. Edema about ankle Other:    Medical Decision Making  Medically screening exam initiated at 7:33 PM.  Appropriate orders placed.  Lisa Galvan was informed that the remainder of the evaluation will be completed by another provider, this initial triage assessment does not replace that evaluation, and the importance of remaining in the ED until their evaluation is complete.  Lisa Mimes, PA-C 04/14/22 1933

## 2022-04-14 NOTE — H&P (Addendum)
History and Physical    Patient: Lisa Galvan IOE:[id number] DOB: June 29, 1960 DOA: 04/14/2022 DOS: the patient was seen and examined on 04/14/2022 PCP: Dorcas Carrow, DO  Patient coming from: Home  Chief Complaint:  Chief Complaint  Patient presents with   Fall   Foot Injury   HPI: Lisa Galvan is a 61 y.o. female with medical history significant of hyperlipidemia, Dm2 with neuropathy, Chronic sciatica, OSA not on Bipap, presents with fall after catching her foot on runner while walking towards her door.  Pt denies syncope, cp, palp, sob, n/v, abd pain, diarrhea, brbpr, black stool, dysuria, hematuria.   Pt called EMS and was brought to ER for evaluation .   In ED, T 98.1, P 85 Bp 151/99 pox 100% on RA  Xray right ankle FINDINGS: Acute bimalleolar fracture dislocation of the right ankle.   Oblique fracture of the distal right fibular metadiaphysis to the level of the tibial plafond with 1/2 shaft with lateral and 1 shaft with posterior displacement and moderate posterior angulation of the distal fracture fragment.   Transverse, avulsive type fracture of the medial malleolus at the level of the tibial plafond with 8 mm medial displacement of the distal fracture fragment.   Acute intra-articular fracture of the anterior articular aspect of the distal tibia with approximately 6-7 mm posterior displacement of the distal fracture fragment into the joint space. Fracture fragment comprises less than 10% of the articular surface.   Posterolateral dislocation of the talar dome in relation to the tibial plafond.   Extensive bimalleolar soft tissue swelling.   IMPRESSION: 1. Acute bimalleolar fracture dislocation of the right ankle as described above.   CT right ankle  IMPRESSION: 1. Trimalleolar right ankle fracture as described above. Tibiotalar alignment has been restored since prior examination. 2. Cortical irregularity involving the superolateral periarticular aspect of  the talar dome in keeping with a probable infraction fracture. 3. Extensive subcutaneous edema and or interstitial hemorrhage. No loculated subcutaneous fluid collection identified. 4. Calcification involving the peroneus brevis tendon at the level of the crossing peroneal longus tendon may relate to remote trauma or inflammation.   Na 137, K 3.8, Bun 6, Creat 0.67 Ast 22, Alt 14  Wbc 7.6, hgb 12.9, Plt 272  Pt given fentanyl iv x1, zofran 4mg  iv x1 and percocet x1 along with Ns 1L iv x1 in ED.   Pt had dislocation of right ankle reduced in ED,  ED spoke with podiatry who will consult and take patient to OR in am.   Pt will be admitted for R trimaleolar fracture.    Review of Systems: negative for all 10 organ systems except for + in above HPI   Past Medical History:  Diagnosis Date   Allergy    Anemia    Arthritis    spine   Bilateral leg edema    Chest pain 12/15/2014   Chronic sciatica 12/10/2014   Essential hypertension 12/10/2014   Gastroesophageal reflux disease without esophagitis    Mixed hyperlipidemia    Neuropathy    Obesity (BMI 30.0-34.9) 12/10/2014   Sleep apnea    waiting for cpap   Type 2 diabetes mellitus without complication (HCC) 12/10/2014   Umbilical hernia    Past Surgical History:  Procedure Laterality Date   ABDOMINAL HYSTERECTOMY     COLONOSCOPY WITH PROPOFOL N/A 03/30/2019   Procedure: COLONOSCOPY WITH PROPOFOL;  Surgeon: 14/10/2018, MD;  Location: [facility] ENDOSCOPY;  Service: Gastroenterology;  Laterality: N/A;   ESOPHAGOGASTRODUODENOSCOPY (  EGD) WITH PROPOFOL N/A 03/12/2017   Procedure: ESOPHAGOGASTRODUODENOSCOPY (EGD) WITH PROPOFOL;  Surgeon: Toney Reil, MD;  Location: [facility] ENDOSCOPY;  Service: Gastroenterology;  Laterality: N/A;   ESOPHAGOGASTRODUODENOSCOPY (EGD) WITH PROPOFOL N/A 03/30/2019   Procedure: ESOPHAGOGASTRODUODENOSCOPY (EGD) WITH PROPOFOL;  Surgeon: Toney Reil, MD;  Location: [facility] ENDOSCOPY;   Service: Gastroenterology;  Laterality: N/A;   HERNIA REPAIR     INCISIONAL HERNIA REPAIR N/A 11/29/2020   Procedure: HERNIA REPAIR INCISIONAL, open;  Surgeon: Leafy Ro, MD;  Location: [facility] ORS;  Service: General;  Laterality: N/A;   ORIF ANKLE FRACTURE Left 07/30/2019   Procedure: OPEN REDUCTION INTERNAL FIXATION (ORIF) OF LEFT BIMALLEOLAR ANKLE FRACTURE;  Surgeon: Signa Kell, MD;  Location: [facility];  Service: Orthopedics;  Laterality: Left;  Diabetic - oral meds   SUPRACERVICAL ABDOMINAL HYSTERECTOMY     UMBILICAL HERNIA REPAIR N/A 05/01/2017   Procedure: HERNIA REPAIR UMBILICAL ADULT;  Surgeon: Ancil Linsey, MD;  Location: [facility] ORS;  Service: General;  Laterality: N/A;   XI ROBOTIC ASSISTED VENTRAL HERNIA N/A 10/06/2019   Procedure: XI ROBOTIC ASSISTED VENTRAL HERNIA;  Surgeon: Leafy Ro, MD;  Location: [facility] ORS;  Service: General;  Laterality: N/A;   Social History:  reports that she has never smoked. She has never used smokeless tobacco. She reports current alcohol use of about 3.0 standard drinks of alcohol per week. She reports that she does not use drugs.  Allergies  Allergen Reactions   Benazepril Swelling    Face and mouth swelling.   Ivp Dye [Iodinated Contrast Media] Anaphylaxis    Patient unsure of reaction but in case of reaction d/t shellfish allergy   Lidocaine Rash    02-01-2021: Patient had lidocaine patch applied to chest at sight of injury and she experienced a rash, whelping, and swelling. Resolved after removing the patch   Lyrica [Pregabalin] Swelling    Face/ mouth swelled up balloon   Shellfish Allergy Anaphylaxis and Swelling    Betadine on skin is okay   Shellfish-Derived Products Anaphylaxis   Trulicity [Dulaglutide] Swelling    Face and mouth swelled up   Ace Inhibitors Swelling    Family History  Problem Relation Age of Onset   Diabetes Mother    Cancer Mother        breast and stomach   Breast cancer Mother 83    Dementia Mother    Heart disease Father    Kidney disease Father        dialysis   Cancer Father        colon   Diabetes Father    Hypertension Sister    Hypertension Brother    Hypertension Daughter    Congestive Heart Failure Maternal Grandmother    Diabetes Maternal Grandmother    Hypertension Maternal Grandmother     Prior to Admission medications   Medication Sig Start Date End Date Taking? Authorizing Provider  aspirin EC 81 MG tablet Take 81 mg by mouth daily. Swallow whole.   Yes [provider]  atorvastatin (LIPITOR) 20 MG tablet Take 1 tablet (20 mg total) by mouth daily. TAKE 1 TABLET(20 MG) BY MOUTH DAILY 12/04/21  Yes Johnson, Megan P, DO  gabapentin (NEURONTIN) 300 MG capsule TAKE 2 CAPSULES(600 MG) BY MOUTH at BEDTIME and 1 CAPSULE (300MG ) In the AM and PM 01/15/22  Yes Johnson, Megan P, DO  metFORMIN (GLUCOPHAGE) 500 MG tablet Take 2 tablets (1,000 mg total) by mouth 2 (two) times daily with a meal. 12/04/21  Yes Johnson, Megan P, DO  omeprazole (PRILOSEC) 40 MG capsule TAKE 1 CAPSULE(40 MG) BY MOUTH BID 01/15/22  Yes Johnson, Megan P, DO  sucralfate (CARAFATE) 1 g tablet Take 1 tablet (1 g total) by mouth 4 (four) times daily -  with meals and at bedtime. 03/06/22  Yes Johnson, Megan P, DO  Accu-Chek Softclix Lancets lancets TEST TWICE DAILY 10/14/20   Olevia PerchesJohnson, Megan P, DO  Cholecalciferol (VITAMIN D-3) 5000 units TABS Take 5,000 Units by mouth daily.    [provider]  DULoxetine (CYMBALTA) 30 MG capsule Take 1 capsule (30 mg total) by mouth daily. (Take with 60mg  for a total dose of 90mg ) 01/15/22   Johnson, Megan P, DO  DULoxetine (CYMBALTA) 60 MG capsule Take 1 capsule (60 mg total) by mouth daily. (Take with 30mg  for total dose of 90mg ) 01/15/22   Johnson, Megan P, DO  EPINEPHrine 0.3 mg/0.3 mL IJ SOAJ injection Inject 0.3 mg into the muscle as needed for anaphylaxis. 08/03/20   Johnson, Megan P, DO  fexofenadine (ALLERGY RELIEF) 180 MG tablet Take 1  tablet (180 mg total) by mouth daily. 04/12/21   Johnson, Megan P, DO  ibuprofen (ADVIL) 800 MG tablet Take 1 tablet (800 mg total) by mouth every 8 (eight) hours as needed. 08/15/21   Johnson, Megan P, DO  ketoconazole (NIZORAL) 2 % cream APPLY TOPICALLY TO THE AFFECTED AREA DAILY AS NEEDED FOR IRRITATION 09/07/21   Johnson, Megan P, DO  Magnesium 500 MG TABS Take 500 mg by mouth daily.    [provider]  magnesium gluconate (MAGONATE) 500 MG tablet Take 500 mg by mouth 2 (two) times daily.    [provider]  montelukast (SINGULAIR) 10 MG tablet Take 1 tablet (10 mg total) by mouth at bedtime. TAKE 1 TABLET(10 MG) BY MOUTH AT BEDTIME 12/04/21   Johnson, Megan P, DO  potassium chloride (KLOR-CON M) 10 MEQ tablet TAKE 3 TABLETS(30 MEQ) BY MOUTH DAILY 01/15/22   Olevia PerchesJohnson, Megan P, DO  vitamin B-12 (CYANOCOBALAMIN) 1000 MCG tablet Take 1,000 mcg by mouth 2 (two) times daily.    [provider]    Physical Exam: Vitals:   04/14/22 1930 04/14/22 1931  BP: (!) 131/90   Pulse: (!) 109   Resp: (!) 32   Temp: 98 F (36.7 C)   TempSrc: Oral   SpO2: 100%   Weight:  68.5 kg  Height:  5' (1.524 m)   Heent: anicteric Neck: no jvd, no bruit Heart: rrr s1, s2, no m/g/r Lung: ctab Abd: soft, obese, nt, nd, +bs Ext: no c/c/e,   right distal lower ext wrapped Skin: no rash Neuro: nonfocal   Data Reviewed:     Assessment and Plan: R Trimaleolar ankle fracture Check CXR, urinalysis, 12 lead EKG for baseline NPO after MN Ns at 75mL per hour x 10 hr Dilaudid 1mg  iv q3h prn severe pain  Percocet prn moderate pain Tylenol prn mild pain Zofran 4mg  iv q6h prn nausea Benadryl 25mg  iv q6h prn itch Podiatry consulted by ED, appreciate input, likely to OR in am  Dm2 Cont aspirin STOP Metformin  Fsbs q4h, ISS  Diabetic neuropathy Cont Gabapentin 300mg  po am, and 600mg  po qhs  Hyperlipidemia Cont Atorvastatin 20mg  po qday  Gerd Cont PPI Pt only uses sucralfate prn    DVT prophylaxis: lovenox [place] FULL CODE DIspo: home vs rehab  Pt will be admitted inpatient > 2 nites stay for R trimaleolar ankle fracture   Advance Care  Planning:   Code Status: Not on file FULL CODE  Consults: podiatry  Family Communication: w patient  Severity of Illness: The appropriate patient status for this patient is INPATIENT. Inpatient status is judged to be reasonable and necessary in order to provide the required intensity of service to ensure the patient's safety. The patient's presenting symptoms, physical exam findings, and initial radiographic and laboratory data in the context of their chronic comorbidities is felt to place them at high risk for further clinical deterioration. Furthermore, it is not anticipated that the patient will be medically stable for discharge from the hospital within 2 midnights of admission.   * I certify that at the point of admission it is my clinical judgment that the patient will require inpatient hospital care spanning beyond 2 midnights from the point of admission due to high intensity of service, high risk for further deterioration and high frequency of surveillance required.*  Author: Pearson Grippe, MD 04/14/2022 11:27 PM  For on call review [email or web address].

## 2022-04-15 ENCOUNTER — Inpatient Hospital Stay: Payer: BC Managed Care – PPO

## 2022-04-15 ENCOUNTER — Inpatient Hospital Stay: Payer: BC Managed Care – PPO | Admitting: Anesthesiology

## 2022-04-15 ENCOUNTER — Other Ambulatory Visit: Payer: Self-pay

## 2022-04-15 ENCOUNTER — Encounter
Admission: EM | Disposition: A | Discharge status: Home / Self Care | Payer: Self-pay | Source: Home / Self Care | Attending: Hospitalist

## 2022-04-15 DIAGNOSIS — S82851A Displaced trimalleolar fracture of right lower leg, initial encounter for closed fracture: Principal | ICD-10-CM

## 2022-04-15 HISTORY — PX: ANKLE CLOSED REDUCTION: SHX880

## 2022-04-15 LAB — CBC
HCT: 34.5 % — ABNORMAL LOW (ref 36.0–46.0)
Hemoglobin: 11.5 g/dL — ABNORMAL LOW (ref 12.0–15.0)
MCH: 28 pg (ref 26.0–34.0)
MCHC: 33.3 g/dL (ref 30.0–36.0)
MCV: 83.9 fL (ref 80.0–100.0)
Platelets: 241 10*3/uL (ref 150–400)
RBC: 4.11 MIL/uL (ref 3.87–5.11)
RDW: 14.3 % (ref 11.5–15.5)
WBC: 7.6 10*3/uL (ref 4.0–10.5)
nRBC: 0 % (ref 0.0–0.2)

## 2022-04-15 LAB — BASIC METABOLIC PANEL
Anion gap: 9 (ref 5–15)
BUN: 6 mg/dL — ABNORMAL LOW (ref 8–23)
CO2: 25 mmol/L (ref 22–32)
Calcium: 8.8 mg/dL — ABNORMAL LOW (ref 8.9–10.3)
Chloride: 107 mmol/L (ref 98–111)
Creatinine, Ser: 0.61 mg/dL (ref 0.44–1.00)
GFR, Estimated: >60 mL/min (ref >60)
Glucose, Bld: 122 mg/dL — ABNORMAL HIGH (ref 70–99)
Potassium: 4.1 mmol/L (ref 3.5–5.1)
Sodium: 141 mmol/L (ref 135–145)

## 2022-04-15 LAB — PROTIME-INR
INR: 1.3 — ABNORMAL HIGH (ref 0.8–1.2)
Prothrombin Time: 15.6 seconds — ABNORMAL HIGH (ref 11.4–15.2)

## 2022-04-15 LAB — APTT: aPTT: 31 seconds (ref 24–36)

## 2022-04-15 LAB — TYPE AND SCREEN
ABO/RH(D): O POS
Antibody Screen: NEGATIVE

## 2022-04-15 LAB — HIV ANTIBODY (ROUTINE TESTING W REFLEX): HIV Screen 4th Generation wRfx: NONREACTIVE

## 2022-04-15 LAB — CBG MONITORING, ED
Glucose-Capillary: 112 mg/dL — ABNORMAL HIGH (ref 70–99)
Glucose-Capillary: 120 mg/dL — ABNORMAL HIGH (ref 70–99)
Glucose-Capillary: 103 mg/dL — ABNORMAL HIGH (ref 70–99)

## 2022-04-15 SURGERY — CLOSED REDUCTION, ANKLE
Anesthesia: General | Site: Ankle | Laterality: Right

## 2022-04-15 MED ORDER — ACETAMINOPHEN 10 MG/ML IV SOLN: 1000.0000 mg | Freq: Once | INTRAVENOUS | Status: DC | PRN

## 2022-04-15 MED ORDER — LIDOCAINE HCL (CARDIAC) PF 100 MG/5ML IV SOSY
PREFILLED_SYRINGE | INTRAVENOUS | Status: DC | PRN
  Administered 2022-04-15: 70 mg via INTRAVENOUS

## 2022-04-15 MED ORDER — PHENYLEPHRINE HCL-NACL 20-0.9 MG/250ML-% IV SOLN
INTRAVENOUS | Status: AC
  Filled 2022-04-15: qty 250

## 2022-04-15 MED ORDER — SUGAMMADEX SODIUM 200 MG/2ML IV SOLN
INTRAVENOUS | Status: DC | PRN
  Administered 2022-04-15: 200 mg via INTRAVENOUS

## 2022-04-15 MED ORDER — PROPOFOL 10 MG/ML IV BOLUS
INTRAVENOUS | Status: DC | PRN
  Administered 2022-04-15: 120 mg via INTRAVENOUS

## 2022-04-15 MED ORDER — MORPHINE SULFATE (PF) 2 MG/ML IV SOLN
2.0000 mg | INTRAVENOUS | Status: DC | PRN
  Administered 2022-04-15 (×2): 2 mg via INTRAVENOUS
  Filled 2022-04-15 (×2): qty 1

## 2022-04-15 MED ORDER — DULOXETINE HCL 60 MG PO CPEP
60.0000 mg | ORAL_CAPSULE | Freq: Every day | ORAL | Status: DC
  Administered 2022-04-16 – 2022-04-17 (×2): 60 mg via ORAL
  Filled 2022-04-15 (×3): qty 1

## 2022-04-15 MED ORDER — 0.9 % SODIUM CHLORIDE (POUR BTL) OPTIME
TOPICAL | Status: DC | PRN
  Administered 2022-04-15: 1000 mL

## 2022-04-15 MED ORDER — FENTANYL CITRATE (PF) 100 MCG/2ML IJ SOLN
INTRAMUSCULAR | Status: AC
  Filled 2022-04-15: qty 2

## 2022-04-15 MED ORDER — BUPIVACAINE HCL (PF) 0.25 % IJ SOLN
INTRAMUSCULAR | Status: AC
  Filled 2022-04-15: qty 30

## 2022-04-15 MED ORDER — METFORMIN HCL 500 MG PO TABS
1000.0000 mg | ORAL_TABLET | Freq: Two times a day (BID) | ORAL | Status: DC
  Administered 2022-04-15 – 2022-04-17 (×5): 1000 mg via ORAL
  Filled 2022-04-15 (×5): qty 2

## 2022-04-15 MED ORDER — FENTANYL CITRATE (PF) 100 MCG/2ML IJ SOLN
25.0000 ug | INTRAMUSCULAR | Status: DC | PRN
  Administered 2022-04-15: 25 ug via INTRAVENOUS

## 2022-04-15 MED ORDER — PHENYLEPHRINE 80 MCG/ML (10ML) SYRINGE FOR IV PUSH (FOR BLOOD PRESSURE SUPPORT)
PREFILLED_SYRINGE | INTRAVENOUS | Status: DC | PRN
  Administered 2022-04-15 (×4): 80 ug via INTRAVENOUS

## 2022-04-15 MED ORDER — OXYCODONE HCL 5 MG PO TABS
5.0000 mg | ORAL_TABLET | ORAL | Status: DC | PRN
  Administered 2022-04-15 – 2022-04-17 (×8): 5 mg via ORAL
  Filled 2022-04-15 (×2): qty 1
  Filled 2022-04-15: qty 2
  Filled 2022-04-15 (×5): qty 1

## 2022-04-15 MED ORDER — OXYCODONE HCL 5 MG/5ML PO SOLN: 5.0000 mg | Freq: Once | ORAL | Status: AC | PRN

## 2022-04-15 MED ORDER — MIDAZOLAM HCL 2 MG/2ML IJ SOLN
INTRAMUSCULAR | Status: AC
  Filled 2022-04-15: qty 2

## 2022-04-15 MED ORDER — DEXAMETHASONE SODIUM PHOSPHATE 10 MG/ML IJ SOLN
INTRAMUSCULAR | Status: DC | PRN
  Administered 2022-04-15: 10 mg via INTRAVENOUS

## 2022-04-15 MED ORDER — MIDAZOLAM HCL 2 MG/2ML IJ SOLN
INTRAMUSCULAR | Status: DC | PRN
  Administered 2022-04-15: 2 mg via INTRAVENOUS

## 2022-04-15 MED ORDER — ROCURONIUM BROMIDE 100 MG/10ML IV SOLN
INTRAVENOUS | Status: DC | PRN
  Administered 2022-04-15: 40 mg via INTRAVENOUS

## 2022-04-15 MED ORDER — FENTANYL CITRATE (PF) 100 MCG/2ML IJ SOLN
INTRAMUSCULAR | Status: AC
  Administered 2022-04-15: 25 ug via INTRAVENOUS
  Filled 2022-04-15: qty 2

## 2022-04-15 MED ORDER — PROPOFOL 10 MG/ML IV BOLUS
INTRAVENOUS | Status: AC
  Filled 2022-04-15: qty 20

## 2022-04-15 MED ORDER — LACTATED RINGERS IV SOLN: INTRAVENOUS | Status: DC

## 2022-04-15 MED ORDER — BUPIVACAINE HCL (PF) 0.5 % IJ SOLN
INTRAMUSCULAR | Status: AC
  Filled 2022-04-15: qty 30

## 2022-04-15 MED ORDER — HYDROMORPHONE HCL 1 MG/ML IJ SOLN
2.0000 mg | Freq: Once | INTRAMUSCULAR | Status: AC
  Administered 2022-04-15: 2 mg via INTRAVENOUS
  Filled 2022-04-15: qty 2

## 2022-04-15 MED ORDER — FENTANYL CITRATE (PF) 100 MCG/2ML IJ SOLN
INTRAMUSCULAR | Status: DC | PRN
  Administered 2022-04-15 (×2): 50 ug via INTRAVENOUS

## 2022-04-15 MED ORDER — OXYCODONE HCL 5 MG PO TABS
ORAL_TABLET | ORAL | Status: AC
  Filled 2022-04-15: qty 1

## 2022-04-15 MED ORDER — ONDANSETRON HCL 4 MG/2ML IJ SOLN: 4.0000 mg | Freq: Once | INTRAMUSCULAR | Status: DC | PRN

## 2022-04-15 MED ORDER — ACETAMINOPHEN 10 MG/ML IV SOLN
INTRAVENOUS | Status: DC | PRN
  Administered 2022-04-15: 1000 mg via INTRAVENOUS

## 2022-04-15 MED ORDER — HYDRALAZINE HCL 20 MG/ML IJ SOLN
2.0000 mg | Freq: Four times a day (QID) | INTRAMUSCULAR | Status: DC | PRN
  Filled 2022-04-15: qty 1

## 2022-04-15 MED ORDER — ONDANSETRON HCL 4 MG/2ML IJ SOLN
INTRAMUSCULAR | Status: DC | PRN
  Administered 2022-04-15: 4 mg via INTRAVENOUS

## 2022-04-15 MED ORDER — OXYCODONE HCL 5 MG PO TABS
5.0000 mg | ORAL_TABLET | Freq: Once | ORAL | Status: AC | PRN
  Administered 2022-04-15: 5 mg via ORAL

## 2022-04-15 SURGICAL SUPPLY — 40 items
BNDG CMPR 5X4 CHSV STRCH STRL (GAUZE/BANDAGES/DRESSINGS) ×1
BNDG CMPR STD VLCR NS LF 5.8X4 (GAUZE/BANDAGES/DRESSINGS) ×1
BNDG COHESIVE 4X5 TAN STRL LF (GAUZE/BANDAGES/DRESSINGS) ×1 IMPLANT
BNDG ELASTIC 4X5.8 VLCR NS LF (GAUZE/BANDAGES/DRESSINGS) ×1 IMPLANT
BNDG ELASTIC 4X5.8 VLCR STR LF (GAUZE/BANDAGES/DRESSINGS) IMPLANT
BNDG ELASTIC 6X5.8 VLCR STR LF (GAUZE/BANDAGES/DRESSINGS) IMPLANT
BNDG ESMARK 4X12 TAN STRL LF (GAUZE/BANDAGES/DRESSINGS) ×1 IMPLANT
CUFF TOURN SGL QUICK 18X4 (TOURNIQUET CUFF) IMPLANT
CUFF TOURN SGL QUICK 24 (TOURNIQUET CUFF)
CUFF TRNQT CYL 24X4X16.5-23 (TOURNIQUET CUFF) IMPLANT
DRAPE C-ARM 42X70 (DRAPES) ×1 IMPLANT
DRAPE C-ARMOR (DRAPES) ×1 IMPLANT
ELECT REM PT RETURN 9FT ADLT (ELECTROSURGICAL)
ELECTRODE REM PT RTRN 9FT ADLT (ELECTROSURGICAL) ×1 IMPLANT
GAUZE SPONGE 4X4 12PLY STRL (GAUZE/BANDAGES/DRESSINGS) ×1 IMPLANT
GAUZE XEROFORM 1X8 LF (GAUZE/BANDAGES/DRESSINGS) ×1 IMPLANT
GAUZE XEROFORM 5X9 LF (GAUZE/BANDAGES/DRESSINGS) IMPLANT
GLOVE BIO SURGEON STRL SZ8 (GLOVE) ×1 IMPLANT
GLOVE BIOGEL PI IND STRL 8 (GLOVE) ×1 IMPLANT
GOWN STRL REUS W/ TWL XL LVL3 (GOWN DISPOSABLE) ×3 IMPLANT
GOWN STRL REUS W/TWL XL LVL3 (GOWN DISPOSABLE)
KIT TURNOVER KIT A (KITS) ×1 IMPLANT
LABEL OR SOLS (LABEL) ×1 IMPLANT
MANIFOLD NEPTUNE II (INSTRUMENTS) ×1 IMPLANT
NEEDLE HYPO 22GX1.5 SAFETY (NEEDLE) ×1 IMPLANT
NS IRRIG 500ML POUR BTL (IV SOLUTION) ×1 IMPLANT
PACK EXTREMITY ARMC (MISCELLANEOUS) ×1 IMPLANT
PAD ABD DERMACEA PRESS 5X9 (GAUZE/BANDAGES/DRESSINGS) ×1 IMPLANT
PAD PREP 24X41 OB/GYN DISP (PERSONAL CARE ITEMS) ×1 IMPLANT
SPLINT PLASTER CAST FAST 5X30 (CAST SUPPLIES) IMPLANT
SPONGE T-LAP 18X18 ~~LOC~~+RFID (SPONGE) ×1 IMPLANT
STAPLER SKIN PROX 35W (STAPLE) IMPLANT
STOCKINETTE M/LG 89821 (MISCELLANEOUS) ×1 IMPLANT
STRAP SAFETY 5IN WIDE (MISCELLANEOUS) ×1 IMPLANT
SUT MNCRL AB 3-0 PS2 27 (SUTURE) ×1 IMPLANT
SUT MNCRL AB 4-0 PS2 18 (SUTURE) ×1 IMPLANT
SUT MON AB 5-0 P3 18 (SUTURE) ×1 IMPLANT
SYR 10ML LL (SYRINGE) ×1 IMPLANT
TRAP FLUID SMOKE EVACUATOR (MISCELLANEOUS) ×1 IMPLANT
WATER STERILE IRR 500ML POUR (IV SOLUTION) ×1 IMPLANT

## 2022-04-15 NOTE — Anesthesia Procedure Notes (Signed)
Procedure Name: Intubation Date/Time: 04/15/2022 9:25 AM  Performed by: Joanette Gula, Krissa Utke, CRNAPre-anesthesia Checklist: Patient identified, Emergency Drugs available, Suction available, Patient being monitored and Timeout performed Patient Re-evaluated:Patient Re-evaluated prior to induction Oxygen Delivery Method: Circle system utilized Preoxygenation: Pre-oxygenation with 100% oxygen Induction Type: IV induction Ventilation: Mask ventilation without difficulty Laryngoscope Size: McGraph and 3 Grade View: Grade I Tube type: Oral Tube size: 6.5 mm Number of attempts: 1 Airway Equipment and Method: Stylet Placement Confirmation: ETT inserted through vocal cords under direct vision, positive ETCO2 and breath sounds checked- equal and bilateral Secured at: 18 cm Tube secured with: Tape Dental Injury: Teeth and Oropharynx as per pre-operative assessment

## 2022-04-15 NOTE — H&P (Signed)
History and Physical Interval Note:  04/15/2022 8:58 AM  Lisa Galvan  has presented today for surgery, with the diagnosis of right ankle trimalleolar fracture.  The various methods of treatment have been discussed with the patient and family. After consideration of risks, benefits and other options for treatment, the patient has consented to   Procedure(s): OPEN REDUCTION INTERNAL FIXATION (ORIF) ANKLE FRACTURE (Right) as a surgical intervention.  The patient's history has been reviewed, patient examined, no change in status, stable for surgery.  I have reviewed the patient's chart and labs.  Questions were answered to the patient's satisfaction.     Edwin Cap

## 2022-04-15 NOTE — ED Notes (Signed)
[name] OR nurse called and report given

## 2022-04-15 NOTE — Progress Notes (Signed)
Docia Furl NP notified of Pt. B/P of 162/95 at 2251. . And, of 2045 B/P of 160/90. PRN order for Hydralazine ordered for Sys. B/P of >165 ordered by K. Foust NP.

## 2022-04-15 NOTE — Progress Notes (Signed)
  PROGRESS NOTE    Lisa Galvan  AST:[id number] DOB: 07-17-1960 DOA: 04/14/2022 PCP: Lisa Perches P, DO  [id number]  LOS: 1 day   Brief hospital course:   Assessment & Plan: Lisa Galvan is a 61 y.o. female with medical history significant of hyperlipidemia, Dm2 with neuropathy, Chronic sciatica, OSA not on Bipap, presents with fall after catching her foot on runner while walking towards her door.    R Trimaleolar ankle fracture --ORIF abandoned today due to severe hemorrhagic fracture blisters. --will plan for outpatient ORIF in about 10 days --PT eval for disposition   Dm2 --resume home metformin --d/c BG checks   Diabetic neuropathy Cont Gabapentin 300mg  po am, and 600mg  po qhs   Hyperlipidemia Cont Atorvastatin 20mg  po qday   Gerd --cont PPI   DVT prophylaxis: Lovenox SQ Code Status: Full code  Family Communication:  Level of care: Telemetry Medical Dispo:   The patient is from: home Anticipated d/c is to: to be determined Anticipated d/c date is: to be determined   Subjective and Interval History:  ORIF abandoned today due to severe hemorrhagic fracture blisters.   Objective: Vitals:   04/15/22 1100 04/15/22 1145 04/15/22 1300 04/15/22 1344  BP: 110/73 (!) 138/94  130/87  Pulse: 98 92 95 86  Resp: (!) 22 20  18   Temp:  97.7 F (36.5 C)  98.3 F (36.8 C)  TempSrc:  Oral  Oral  SpO2: 97% 92% 98%   Weight:      Height:        Intake/Output Summary (Last 24 hours) at 04/15/2022 1535 Last data filed at 04/15/2022 0955 Gross per 24 hour  Intake 1699 ml  Output 0 ml  Net 1699 ml   Filed Weights   04/14/22 1931  Weight: 68.5 kg    Examination:   Constitutional: NAD, AAOx3 HEENT: conjunctivae and lids normal, EOMI CV: No cyanosis.   RESP: normal respiratory effort, on RA Extremities: right foot wrapped and in boot SKIN: warm, dry Neuro: II - XII grossly intact.   Psych: Normal mood and affect.  Appropriate judgement and reason   Data  Reviewed: I have personally reviewed labs and imaging studies  Time spent: 35 minutes  , MD [facility] If 7PM-7AM, please contact night-coverage 04/15/2022, 3:35 PM

## 2022-04-15 NOTE — Anesthesia Preprocedure Evaluation (Addendum)
Anesthesia Evaluation  Patient identified by MRN, date of birth, ID band Patient awake    Reviewed: Allergy & Precautions, NPO status , Patient's Chart, lab work & pertinent test results  History of Anesthesia Complications Negative for: history of anesthetic complications  Airway Mallampati: IV   Neck ROM: Full    Dental no notable dental hx.    Pulmonary sleep apnea    Pulmonary exam normal breath sounds clear to auscultation       Cardiovascular hypertension, Normal cardiovascular exam Rhythm:Regular Rate:Normal  ECG 12/11/21:  Unusual P axis, possible ectopic atrial tachycardia Nonspecific T wave abnormality  Myocardial perfusion 11/18/20: Normal myocardial perfusion without evidence of myocardial ischemia  Echo 11/18/20:  NORMAL LEFT VENTRICULAR SYSTOLIC FUNCTION   WITH MODERATE LVH  NORMAL RIGHT VENTRICULAR SYSTOLIC FUNCTION  TRIVIAL REGURGITATION NOTED  NO VALVULAR STENOSIS  ESTIMATED LVEF >55%  Aortic: TRIVIAL AI  Mitral: TRIVIAL MR  Tricuspid: TRIVIAL TR  Pulmonic: TRIVIAL P    Neuro/Psych  Neuromuscular disease (neuropathy)    GI/Hepatic ,GERD  ,,  Endo/Other  diabetes, Type 2    Renal/GU negative Renal ROS     Musculoskeletal  (+) Arthritis ,    Abdominal   Peds  Hematology  (+) Blood dyscrasia, anemia   Anesthesia Other Findings Note: lidocaine allergy was a rash to lidocaine patch   Cardiology note 12/13/21:  61 y.o. female with  Encounter Diagnoses  Name Primary?  Benign essential HTN Yes  Aortic atherosclerosis (CMS-HCC)  LVH (left ventricular hypertrophy) due to hypertensive disease, without heart failure  Mixed hyperlipidemia  OSA (obstructive sleep apnea)  SOBOE (shortness of breath on exertion)  Type 2 diabetes mellitus with diabetic neuropathy, without long-term current use of insulin (CMS-HCC)   Plan  -No addition of medications at this time for essential hypertension. We have  discussed the need for continued monitoring in the future.  -We have had a long discussion about the risk factors, causes, treatments, and manifestations of left ventricular hypertrophy. The long-term plan is to use medications, diet, exercise counseling, and risk factor modification to reduce future symptoms, manifestations, and the potential for congestive heart failure. -We have discussed risk reduction in the cardiovascular disease process by a continuation of lipid management with the current medication management for lipid reduction. The goals continue to be 30-50% lowering of LDL cholesterol in addition to lifestyle measures. This will include diet and improved activity level on a regular basis.The patient has an understanding of this discussion at this time and we will continue the appropriate strategy. -We have had a discussion of the benefits of CPAP machine use as well as its possible frustrations. The patient is encouraged to continue diligent use CPAP machine for reduction of sleep apnea signs and symptoms which may include shortness of breath, hypertension, and lower extremity edema. There also will be close evaluation of the equipment and future symptom resolution on a regular basis to provide the best outcomes of treatment. -No further intervention shortness of breath reported above multifactorial in nature including age, decreased exercise tolerance, disability, and/or medication management. The patient is to follow for any worsening symptoms or increase in severity for further in need in investigation or treatment options.  -Continue aggressive medical management of diabetes following the ABCs for prevention of cardiovascular disease and complications. The goals set forth include a goal HbA1c of less than 7, moderate to high intensity statin use if patient can tolerate, and a goal systolic blood pressure of below .  -The patient  has been instructed to adhere to improving healthy  lifestyle. We have specifically addressed the most important factors including abstinence of tobacco use, regular physical activity including the possibility of exercise prescription, healthy diet, and maintaining an appropriate body mass index.  Orders Placed This Encounter  Procedures  ORDER   Return if symptoms worsen or fail to improve.    Reproductive/Obstetrics                             Anesthesia Physical Anesthesia Plan  ASA: 2  Anesthesia Plan: General   Post-op Pain Management:    Induction: Intravenous  PONV Risk Score and Plan: 3 and Ondansetron, Dexamethasone and Treatment may vary due to age or medical condition  Airway Management Planned: Oral ETT  Additional Equipment:   Intra-op Plan:   Post-operative Plan: Extubation in OR  Informed Consent: I have reviewed the patients History and Physical, chart, labs and discussed the procedure including the risks, benefits and alternatives for the proposed anesthesia with the patient or authorized representative who has indicated his/her understanding and acceptance.     Dental advisory given  Plan Discussed with: CRNA  Anesthesia Plan Comments: (Consented for postoperative nerve block if needed.  Patient consented for risks of anesthesia including but not limited to:  - adverse reactions to medications - damage to eyes, teeth, lips or other oral mucosa - nerve damage due to positioning  - sore throat or hoarseness - damage to heart, brain, nerves, lungs, other parts of body or loss of life  Informed patient about role of CRNA in peri- and intra-operative care.  Patient voiced understanding.)        Anesthesia Quick Evaluation

## 2022-04-15 NOTE — ED Notes (Signed)
Report given to Leala, RN  

## 2022-04-15 NOTE — Transfer of Care (Signed)
Immediate Anesthesia Transfer of Care Note  Patient: Lisa Galvan  Procedure(s) Performed: OPEN REDUCTION INTERNAL FIXATION (ORIF) ANKLE FRACTURE (Right: Ankle)  Patient Location: PACU  Anesthesia Type:General  Level of Consciousness: awake, alert , and oriented  Airway & Oxygen Therapy: Patient Spontanous Breathing and Patient connected to face mask oxygen  Post-op Assessment: Report given to RN and Post -op Vital signs reviewed and stable  Post vital signs: Reviewed and stable  Last Vitals:  Vitals Value Taken Time  BP 132/94 04/15/22 0952  Temp    Pulse 95 04/15/22 0954  Resp 17 04/15/22 0954  SpO2 100 % 04/15/22 0954  Vitals shown include unvalidated device data.  Last Pain:  Vitals:   04/15/22 0558  TempSrc: Oral  PainSc:          Complications: No notable events documented.

## 2022-04-15 NOTE — Consult Note (Addendum)
Reason for Consult: right ankle fracture Referring Physician: Betha Loa, PA-C   Lisa Galvan is an 61 y.o. female.  HPI: Last night she went to answer the door and caught her foot on the wrong and tripped and fell.  Felt immediate pain.  Had immediate bruising and deformity of the ankle she called EMS and was brought to ER.  X-ray showed trimalleolar fracture reduction performed in ER.  CT scan completed last night.  She says her diabetes is well-controlled her last A1c was 7.2% and she does not smoke.  No history of osteoporosis or vitamin D deficiency  Past Medical History:  Diagnosis Date   Allergy    Anemia    Arthritis    spine   Bilateral leg edema    Chest pain 12/15/2014   Chronic sciatica 12/10/2014   Essential hypertension 12/10/2014   Gastroesophageal reflux disease without esophagitis    Mixed hyperlipidemia    Neuropathy    Obesity (BMI 30.0-34.9) 12/10/2014   Sleep apnea    waiting for cpap   Type 2 diabetes mellitus without complication ([place]) [id number]   Umbilical hernia     Past Surgical History:  Procedure Laterality Date   ABDOMINAL HYSTERECTOMY     COLONOSCOPY WITH PROPOFOL N/A 03/30/2019   Procedure: COLONOSCOPY WITH PROPOFOL;  Surgeon: Lin Landsman, MD;  Location: [facility];  Service: Gastroenterology;  Laterality: N/A;   ESOPHAGOGASTRODUODENOSCOPY (EGD) WITH PROPOFOL N/A 03/12/2017   Procedure: ESOPHAGOGASTRODUODENOSCOPY (EGD) WITH PROPOFOL;  Surgeon: Lin Landsman, MD;  Location: [facility] ENDOSCOPY;  Service: Gastroenterology;  Laterality: N/A;   ESOPHAGOGASTRODUODENOSCOPY (EGD) WITH PROPOFOL N/A 03/30/2019   Procedure: ESOPHAGOGASTRODUODENOSCOPY (EGD) WITH PROPOFOL;  Surgeon: Lin Landsman, MD;  Location: [facility] ENDOSCOPY;  Service: Gastroenterology;  Laterality: N/A;   HERNIA REPAIR     INCISIONAL HERNIA REPAIR N/A 11/29/2020   Procedure: HERNIA REPAIR INCISIONAL, open;  Surgeon: Jules Husbands, MD;  Location: [facility] ORS;   Service: General;  Laterality: N/A;   ORIF ANKLE FRACTURE Left 07/30/2019   Procedure: OPEN REDUCTION INTERNAL FIXATION (ORIF) OF LEFT BIMALLEOLAR ANKLE FRACTURE;  Surgeon: Leim Fabry, MD;  Location: [place];  Service: Orthopedics;  Laterality: Left;  Diabetic - oral meds   SUPRACERVICAL ABDOMINAL HYSTERECTOMY     UMBILICAL HERNIA REPAIR N/A 05/01/2017   Procedure: HERNIA REPAIR UMBILICAL ADULT;  Surgeon: Vickie Epley, MD;  Location: [facility] ORS;  Service: General;  Laterality: N/A;   XI ROBOTIC ASSISTED VENTRAL HERNIA N/A 10/06/2019   Procedure: XI ROBOTIC ASSISTED VENTRAL HERNIA;  Surgeon: Jules Husbands, MD;  Location: [facility] ORS;  Service: General;  Laterality: N/A;    Family History  Problem Relation Age of Onset   Diabetes Mother    Cancer Mother        breast and stomach   Breast cancer Mother 27   Dementia Mother    Heart disease Father    Kidney disease Father        dialysis   Cancer Father        colon   Diabetes Father    Hypertension Sister    Hypertension Brother    Hypertension Daughter    Congestive Heart Failure Maternal Grandmother    Diabetes Maternal Grandmother    Hypertension Maternal Grandmother     Social History:  reports that she has never smoked. She has never used smokeless tobacco. She reports current alcohol use of about 3.0 standard drinks of alcohol per week. She reports that she does not  use drugs.  Allergies:  Allergies  Allergen Reactions   Benazepril Swelling    Face and mouth swelling.   Ivp Dye [Iodinated Contrast Media] Anaphylaxis    Patient unsure of reaction but in case of reaction d/t shellfish allergy   Lidocaine Rash    02-01-2021: Patient had lidocaine patch applied to chest at sight of injury and she experienced a rash, whelping, and swelling. Resolved after removing the patch   Lyrica [Pregabalin] Swelling    Face/ mouth swelled up balloon   Shellfish Allergy Anaphylaxis and Swelling    Betadine on skin is okay    Shellfish-Derived Products Anaphylaxis   Trulicity [Dulaglutide] Swelling    Face and mouth swelled up   Ace Inhibitors Swelling    Medications: I have reviewed the patient's current medications.  Results for orders placed or performed during the hospital encounter of 04/14/22 (from the past 48 hour(s))  Comprehensive metabolic panel     Status: Abnormal   Collection Time: 04/14/22 10:00 PM  Result Value Ref Range   Sodium 137 135 - 145 mmol/L   Potassium 3.8 3.5 - 5.1 mmol/L   Chloride 102 98 - 111 mmol/L   CO2 26 22 - 32 mmol/L   Glucose, Bld 145 (H) 70 - 99 mg/dL    Comment: Glucose reference range applies only to samples taken after fasting for at least 8 hours.   BUN 6 (L) 8 - 23 mg/dL   Creatinine, Ser 0.67 0.44 - 1.00 mg/dL   Calcium 9.4 8.9 - 10.3 mg/dL   Total Protein 7.2 6.5 - 8.1 g/dL   Albumin 4.2 3.5 - 5.0 g/dL   AST 22 15 - 41 U/L   ALT 14 0 - 44 U/L   Alkaline Phosphatase 58 38 - 126 U/L   Total Bilirubin 1.1 0.3 - 1.2 mg/dL   GFR, Estimated >60 >60 mL/min    Comment: (NOTE) Calculated using the CKD-EPI Creatinine Equation (2021)    Anion gap 9 5 - 15    Comment: Performed at [facility], [address] [place], [place] [address]  CBC with Differential     Status: None   Collection Time: 04/14/22 10:00 PM  Result Value Ref Range   WBC 7.6 4.0 - 10.5 K/uL   RBC 4.59 3.87 - 5.11 MIL/uL   Hemoglobin 12.9 12.0 - 15.0 g/dL   HCT 38.3 36.0 - 46.0 %   MCV 83.4 80.0 - 100.0 fL   MCH 28.1 26.0 - 34.0 pg   MCHC 33.7 30.0 - 36.0 g/dL   RDW 14.5 11.5 - 15.5 %   Platelets 272 150 - 400 K/uL   nRBC 0.0 0.0 - 0.2 %   Neutrophils Relative % 61 %   Neutro Abs 4.6 1.7 - 7.7 K/uL   Lymphocytes Relative 34 %   Lymphs Abs 2.6 0.7 - 4.0 K/uL   Monocytes Relative 4 %   Monocytes Absolute 0.3 0.1 - 1.0 K/uL   Eosinophils Relative 1 %   Eosinophils Absolute 0.1 0.0 - 0.5 K/uL   Basophils Relative 0 %   Basophils Absolute 0.0 0.0 - 0.1 K/uL   Immature  Granulocytes 0 %   Abs Immature Granulocytes 0.03 0.00 - 0.07 K/uL    Comment: Performed at [facility], [address] [place],  [address]  CBG monitoring, ED     Status: Abnormal   Collection Time: 04/15/22  2:46 AM  Result Value Ref Range   Glucose-Capillary 112 (H) 70 -  99 mg/dL    Comment: Glucose reference range applies only to samples taken after fasting for at least 8 hours.  CBG monitoring, ED     Status: Abnormal   Collection Time: 04/15/22  4:04 AM  Result Value Ref Range   Glucose-Capillary 120 (H) 70 - 99 mg/dL    Comment: Glucose reference range applies only to samples taken after fasting for at least 8 hours.  Basic metabolic panel     Status: Abnormal   Collection Time: 04/15/22  4:39 AM  Result Value Ref Range   Sodium 141 135 - 145 mmol/L   Potassium 4.1 3.5 - 5.1 mmol/L   Chloride 107 98 - 111 mmol/L   CO2 25 22 - 32 mmol/L   Glucose, Bld 122 (H) 70 - 99 mg/dL    Comment: Glucose reference range applies only to samples taken after fasting for at least 8 hours.   BUN 6 (L) 8 - 23 mg/dL   Creatinine, Ser 0.61 0.44 - 1.00 mg/dL   Calcium 8.8 (L) 8.9 - 10.3 mg/dL   GFR, Estimated >60 >60 mL/min    Comment: (NOTE) Calculated using the CKD-EPI Creatinine Equation (2021)    Anion gap 9 5 - 15    Comment: Performed at [facility], [address] [place], [place] [address]  CBC     Status: Abnormal   Collection Time: 04/15/22  4:39 AM  Result Value Ref Range   WBC 7.6 4.0 - 10.5 K/uL   RBC 4.11 3.87 - 5.11 MIL/uL   Hemoglobin 11.5 (L) 12.0 - 15.0 g/dL   HCT 34.5 (L) 36.0 - 46.0 %   MCV 83.9 80.0 - 100.0 fL   MCH 28.0 26.0 - 34.0 pg   MCHC 33.3 30.0 - 36.0 g/dL   RDW 14.3 11.5 - 15.5 %   Platelets 241 150 - 400 K/uL   nRBC 0.0 0.0 - 0.2 %    Comment: Performed at [facility], [address] [place], [place] [address]  APTT     Status: None   Collection Time: 04/15/22  4:39 AM  Result Value Ref Range   aPTT 31 24 - 36  seconds    Comment: Performed at [facility], [address] [place], [place] [address]  Protime-INR     Status: Abnormal   Collection Time: 04/15/22  4:39 AM  Result Value Ref Range   Prothrombin Time 15.6 (H) 11.4 - 15.2 seconds   INR 1.3 (H) 0.8 - 1.2    Comment: (NOTE) INR goal varies based on device and disease states. Performed at [facility], [address] [place], [place] [address]   Type and screen [place]     Status: None   Collection Time: 04/15/22  4:39 AM  Result Value Ref Range   ABO/RH(D) O POS    Antibody Screen NEG    Sample Expiration      04/18/2022,2359 Performed at [facility], [address], [place] [address]     US Venous Img Lower Unilateral Right (DVT)  Result Date: 04/15/2022 CLINICAL DATA:  Right leg pain EXAM: Right LOWER EXTREMITY VENOUS DOPPLER ULTRASOUND TECHNIQUE: Gray-scale sonography with compression, as well as color and duplex ultrasound, were performed to evaluate the deep venous system(s) from the level of the common femoral vein through the popliteal and proximal calf veins. COMPARISON:  None Available. FINDINGS: VENOUS Normal compressibility of the common femoral, superficial femoral, and popliteal veins. Unable  to visualize the calf veins due to overlying cast. Visualized portions of profunda femoral vein and great saphenous vein unremarkable. No filling defects to suggest DVT on grayscale or color Doppler imaging. Doppler waveforms show normal direction of venous flow, normal respiratory plasticity and response to augmentation. Limited views of the contralateral common femoral vein are unremarkable. OTHER None. Limitations: Cast about the calf/ankle. IMPRESSION: No evidence of DVT in the visualized veins. The calf veins were not visualized due to overlying cast. Electronically Signed   By: Placido Sou M.D.   On: 04/15/2022 01:58   X-ray chest PA and lateral  Result Date:  04/15/2022 CLINICAL DATA:  Preop for right ankle fracture EXAM: CHEST - 2 VIEW COMPARISON:  01/31/2021 FINDINGS: The heart size and mediastinal contours are within normal limits. Both lungs are clear. The visualized skeletal structures are unremarkable. IMPRESSION: No active cardiopulmonary disease. Electronically Signed   By: Placido Sou M.D.   On: 04/15/2022 00:19   CT Ankle Right Wo Contrast  Result Date: 04/14/2022 CLINICAL DATA:  Right ankle fracture dislocation. EXAM: CT OF THE RIGHT ANKLE WITHOUT CONTRAST TECHNIQUE: Multidetector CT imaging of the right ankle was performed according to the standard protocol. Multiplanar CT image reconstructions were also generated. RADIATION DOSE REDUCTION: This exam was performed according to the departmental dose-optimization program which includes automated exposure control, adjustment of the mA and/or kV according to patient size and/or use of iterative reconstruction technique. COMPARISON:  Right ankle radiographs 7:51 p.m. FINDINGS: Bones/Joint/Cartilage Tibiotalar alignment has been restored since prior examination. Trimalleolar right ankle fracture identified. Oblique, comminuted fracture of the distal fibular metaphyseal region extending to the level of the tibial plafond has been partially reduced now in near anatomic alignment with approximately 5 mm lateral displacement and 5 mm override of the major distal fracture fragment. Medial malleolar fracture again identified with mild comminution. Distal fracture fragment demonstrates near anatomic alignment and 2 mm lateral displacement of the distal fracture fragment. Posterior malleolar fracture fragment identified comprising 10-15% of the articular surface. Fracture fragment is in near anatomic alignment with a 3 mm articular gap within the a distal tibial articular surface. 8 mm ossific fracture fragment seen anterior to the tibiotalar articulation arising from the a anterior articular surface of the  distal tibia. Fracture fragment comprises less than 10% of the distal tibial articular surface. There is, additionally, cortical irregularity involving the superolateral periarticular aspect of the talar dome best seen on axial image # 134/4 and coronal image # 62/5 in keeping with a probable infraction fracture. Ligaments Suboptimally assessed by CT. Muscles and Tendons Calcification involving the peroneus brevis tendon at the level of the crossing peroneal longus tendon may relate to remote trauma or inflammation. No other abnormality seen. Soft tissues Extensive subcutaneous edema and or interstitial hemorrhage. No loculated subcutaneous fluid collection identified. IMPRESSION: 1. Trimalleolar right ankle fracture as described above. Tibiotalar alignment has been restored since prior examination. 2. Cortical irregularity involving the superolateral periarticular aspect of the talar dome in keeping with a probable infraction fracture. 3. Extensive subcutaneous edema and or interstitial hemorrhage. No loculated subcutaneous fluid collection identified. 4. Calcification involving the peroneus brevis tendon at the level of the crossing peroneal longus tendon may relate to remote trauma or inflammation. Electronically Signed   By: Fidela Salisbury M.D.   On: 04/14/2022 22:54   DG Foot Complete Right  Result Date: 04/14/2022 CLINICAL DATA:  Fall, right ankle injury EXAM: RIGHT FOOT COMPLETE - 3+ VIEW COMPARISON:  None Available. FINDINGS:  Comminuted bimalleolar fracture of the right ankle with posterolateral tibiotalar dislocation is better assessed on accompanying radiographs of the right ankle. Osseous structures of the right foot are intact. No fracture or dislocation involving the right foot. Joint spaces are preserved within the right foot. Tiny plantar calcaneal spur. Soft tissues of the right foot are unremarkable. IMPRESSION: 1. Comminuted bimalleolar fracture of the right ankle with posterolateral  tibiotalar dislocation. 2. No fracture or dislocation of the right foot. Electronically Signed   By: Helyn Numbers M.D.   On: 04/14/2022 20:16   DG Ankle Complete Right  Result Date: 04/14/2022 CLINICAL DATA:  Fall, right ankle injury EXAM: RIGHT ANKLE - COMPLETE 3+ VIEW COMPARISON:  None Available. FINDINGS: Acute bimalleolar fracture dislocation of the right ankle. Oblique fracture of the distal right fibular metadiaphysis to the level of the tibial plafond with 1/2 shaft with lateral and 1 shaft with posterior displacement and moderate posterior angulation of the distal fracture fragment. Transverse, avulsive type fracture of the medial malleolus at the level of the tibial plafond with 8 mm medial displacement of the distal fracture fragment. Acute intra-articular fracture of the anterior articular aspect of the distal tibia with approximately 6-7 mm posterior displacement of the distal fracture fragment into the joint space. Fracture fragment comprises less than 10% of the articular surface. Posterolateral dislocation of the talar dome in relation to the tibial plafond. Extensive bimalleolar soft tissue swelling. IMPRESSION: 1. Acute bimalleolar fracture dislocation of the right ankle as described above. Electronically Signed   By: Helyn Numbers M.D.   On: 04/14/2022 20:15    Review of Systems  Constitutional:  Negative for chills, fever and malaise/fatigue.  Cardiovascular:  Negative for chest pain.  Musculoskeletal:  Positive for falls and joint pain (R ankle).  Neurological:  Negative for dizziness and headaches.  All other systems reviewed and are negative.  Blood pressure 119/77, pulse (!) 107, temperature 98 F (36.7 C), temperature source Oral, resp. rate 19, height 5' (1.524 m), weight 68.5 kg, SpO2 98 %.  Vitals:   04/15/22 0630 04/15/22 0700  BP: (!) 136/92 119/77  Pulse: (!) 105 (!) 107  Resp:    Temp:    SpO2: 99% 98%    General AA&O x3. Normal mood and affect.  Vascular  Unable to assess pedal pulses due to splint.  Capillary fill time is normal.  Toes are warm and well-perfused.  Palpable popliteal pulse  Neurologic Epicritic sensation grossly present.  Dermatologic (Wound) Splint in place   Orthopedic: Motor intact BLE.    Assessment/Plan:  Right trimalleolar ankle fracture -Imaging: Studies independently reviewed -Antibiotics: Ancef 2g pre op  -WB Status: NWB LLE -Surgical Plan: to OR today for ORIF of right ankle  Edwin Cap 04/15/2022, 8:59 AM   Best available via secure chat for questions or concerns.

## 2022-04-15 NOTE — Op Note (Addendum)
Patient Name: Lisa Galvan DOB: July 22, 1960  MRN: [id number]   Date of Service: 04/14/2022 - 04/15/2022  Surgeon: Dr. Sharl Ma, DPM Assistants: None Pre-operative Diagnosis:  Right trimalleolar ankle fracture Post-operative Diagnosis:  Right trimalleolar ankle fracture Procedures:  1) Closed reduction right ankle Pathology/Specimens: * No specimens in log * Anesthesia: GA Hemostasis: * Missing tourniquet times found for documented tourniquets in log: [id number] * Estimated Blood Loss: 0 mL Materials: * No implants in log * Medications: none Complications: procedure abandoned due to soft tissue integrity  Indications for Procedure:  This is a 61 y.o. female with a history of a fall 1 day ago and a right trimalleolar ankle fracture. Operative repair was recommended.    Procedure in Detail: Patient was identified in pre-operative holding area. Formal consent was signed and the right lower extremity was marked. Patient was brought back to the operating room. Anesthesia was induced. Upon removal of splint I found several large severe fracture blisters that encompassed the entire lateral and medial ankle that were hemorrhagic in nature. I felt that with her risk factors for diabetes in addition to these fracture blisters her risk for perioperative wound complication and infection was too great to proceed today. The fracture blisters were prepped with Betadine and lanced with a scalpel. They were dressed with Xeroform and dry sterile dressings. Closed manipulation and reduction was performed and confirmed with fluoroscopy. Compression dressing applied in addition to below-knee and saddle fiberglass splint. We will delay surgery for 7-14.   Disposition: Following a period of post-operative monitoring, patient will be transferred to the floor. She should remain non weightbearing. Ice and elevate extremity. PT evaluation is ordered. We will plan for staged ORIF as an outpatient.

## 2022-04-15 NOTE — Progress Notes (Signed)
OT Cancellation Note  Patient Details Name: Kristopher Delk MRN: [id number] DOB: 02/23/1961   Cancelled Treatment:    Reason Eval/Treat Not Completed: Patient not medically ready. Chart reviewed. Pt pending Sx this date. Will sign off. Please re-order when medically stable with appropriate WBing restrictions   Kathie Dike, M.S. OTR/L  04/15/22, 8:26 AM  ascom [phone]

## 2022-04-15 NOTE — Anesthesia Postprocedure Evaluation (Signed)
Anesthesia Post Note  Patient: Lisa Galvan  Procedure(s) Performed: CLOSED REDUCTION ANKLE (Right: Ankle)  Patient location during evaluation: PACU Anesthesia Type: General Level of consciousness: awake and alert, oriented and patient cooperative Pain management: pain level controlled Vital Signs Assessment: post-procedure vital signs reviewed and stable Respiratory status: spontaneous breathing, nonlabored ventilation and respiratory function stable Cardiovascular status: blood pressure returned to baseline and stable Postop Assessment: adequate PO intake Anesthetic complications: no   No notable events documented.   Last Vitals:  Vitals:   04/15/22 1030 04/15/22 1045  BP: 101/77 114/71  Pulse: 96 97  Resp: 17 16  Temp: 36.4 C   SpO2: 100% 100%    Last Pain:  Vitals:   04/15/22 1045  TempSrc:   PainSc: Asleep                 Reed Breech

## 2022-04-15 NOTE — Brief Op Note (Signed)
04/14/2022 - 04/15/2022  9:46 AM  PATIENT:  Lisa Galvan  61 y.o. female  PRE-OPERATIVE DIAGNOSIS:  Right ankle fracture  POST-OPERATIVE DIAGNOSIS:  * No post-op diagnosis entered *  PROCEDURE:  Closed reduction of right ankle - ORIF abandoned due to severe hemorrhagic fracture blisters   SURGEON:  Surgeon(s) and Role:    * Krishna Heuer, Rachelle Hora, DPM - Primary    ANESTHESIA:   none  EBL:  0 mL   BLOOD ADMINISTERED:none  DRAINS: none   LOCAL MEDICATIONS USED:  NONE  SPECIMEN:  No Specimen  DISPOSITION OF SPECIMEN:  N/A  COUNTS:  YES  TOURNIQUET:  * Missing tourniquet times found for documented tourniquets in log: [id number] *  DICTATION: .Note written in EPIC  PLAN OF CARE: Admit to inpatient   PATIENT DISPOSITION:  PACU - hemodynamically stable.   Delay start of Pharmacological VTE agent (>24hrs) due to surgical blood loss or risk of bleeding: no - short acting DVT ppx such as SQ heparin preferred until surgical plan is set   Planned procedure was abandoned on removal of splint due to severe fracture blisters that encompassed the entire lateral and medial ankle that were hemorrhagic in nature.  I felt that with her risk factors for diabetes in addition to his fracture blisters her risk for perioperative wound complication and infection was too great to proceed today.  The fracture blisters were prepped with Betadine and lanced with a scalpel.  They were dressed with Xeroform and dry sterile dressings.  Closed reduction was performed and confirmed with fluoroscopy.  Compression dressing applied in addition to below-knee and saddle fiberglass splint.  We will delay surgery for 5 to 10 days

## 2022-04-15 NOTE — Progress Notes (Signed)
PT Cancellation Note  Patient Details Name: Lisa Galvan MRN: [id number] DOB: 1960-12-28   Cancelled Treatment:    Reason Eval/Treat Not Completed: Other (comment). Chart reviewed. Pt pending Sx this date. Will sign off. Please re-order when medically stable with appropriate WBing restrictions.   [name] 04/15/2022, 8:24 AM Elizabeth Palau, PT, DPT, [facility] [phone]

## 2022-04-16 ENCOUNTER — Encounter: Payer: Self-pay | Admitting: Podiatry

## 2022-04-16 DIAGNOSIS — S82851A Displaced trimalleolar fracture of right lower leg, initial encounter for closed fracture: Secondary | ICD-10-CM | POA: Diagnosis not present

## 2022-04-16 LAB — CBC
HCT: 35.1 % — ABNORMAL LOW (ref 36.0–46.0)
Hemoglobin: 11.8 g/dL — ABNORMAL LOW (ref 12.0–15.0)
MCH: 28 pg (ref 26.0–34.0)
MCHC: 33.6 g/dL (ref 30.0–36.0)
MCV: 83.2 fL (ref 80.0–100.0)
Platelets: 217 10*3/uL (ref 150–400)
RBC: 4.22 MIL/uL (ref 3.87–5.11)
RDW: 13.9 % (ref 11.5–15.5)
WBC: 8.4 10*3/uL (ref 4.0–10.5)
nRBC: 0 % (ref 0.0–0.2)

## 2022-04-16 LAB — BASIC METABOLIC PANEL
Anion gap: 8 (ref 5–15)
BUN: 11 mg/dL (ref 8–23)
CO2: 25 mmol/L (ref 22–32)
Calcium: 9.4 mg/dL (ref 8.9–10.3)
Chloride: 103 mmol/L (ref 98–111)
Creatinine, Ser: 0.82 mg/dL (ref 0.44–1.00)
GFR, Estimated: >60 mL/min (ref >60)
Glucose, Bld: 188 mg/dL — ABNORMAL HIGH (ref 70–99)
Potassium: 4 mmol/L (ref 3.5–5.1)
Sodium: 136 mmol/L (ref 135–145)

## 2022-04-16 LAB — MAGNESIUM: Magnesium: 1.8 mg/dL (ref 1.7–2.4)

## 2022-04-16 MED ORDER — HYDRALAZINE HCL 20 MG/ML IJ SOLN: 10.0000 mg | Freq: Four times a day (QID) | INTRAMUSCULAR | Status: DC | PRN

## 2022-04-16 MED ORDER — HYDRALAZINE HCL 50 MG PO TABS
50.0000 mg | ORAL_TABLET | Freq: Three times a day (TID) | ORAL | Status: DC
  Administered 2022-04-16 – 2022-04-17 (×3): 50 mg via ORAL
  Filled 2022-04-16 (×4): qty 1

## 2022-04-16 NOTE — Progress Notes (Signed)
  PROGRESS NOTE    Lisa Galvan  KPV:[id number] DOB: August 16, 1960 DOA: 04/14/2022 PCP: Olevia Perches P, DO  [id number]  LOS: 2 days   Brief hospital course:   Assessment & Plan: Lisa Galvan is a 61 y.o. female with medical history significant of hyperlipidemia, Dm2 with neuropathy, Chronic sciatica, OSA not on Bipap, presents with fall after catching her foot on runner while walking towards her door.    R Trimaleolar ankle fracture --ORIF abandoned today due to severe hemorrhagic fracture blisters. --will plan for outpatient ORIF in about 10 days --PT eval cleared pt for discharge home with [facility] and [facility]  Dm2 --cont home metformin --no need for BG checks   Diabetic neuropathy Cont Gabapentin 300mg  po am, and 600mg  po qhs   Hyperlipidemia --cont statin   Gerd --cont PPI   DVT prophylaxis: Lovenox SQ Code Status: Full code  Family Communication:  Level of care: Telemetry Medical Dispo:   The patient is from: home Anticipated d/c is to: home Anticipated d/c date is: tomorrow   Subjective and Interval History:  Pt did better than expected with PT today, and pt wanted to go home.     Objective: Vitals:   04/16/22 0700 04/16/22 0811 04/16/22 1123 04/16/22 1610  BP:  (!) 162/100 (!) 165/113 (!) 141/91  Pulse: 94 95 89 93  Resp:  20 20 18   Temp:  (!) 97.5 F (36.4 C) 97.8 F (36.6 C) 98.3 F (36.8 C)  TempSrc:  Axillary Axillary Oral  SpO2: 93% 97% 100% 95%  Weight:      Height:        Intake/Output Summary (Last 24 hours) at 04/16/2022 1756 Last data filed at 04/16/2022 1440 Gross per 24 hour  Intake 360 ml  Output 2650 ml  Net -2290 ml   Filed Weights   04/14/22 1931 04/16/22 0300  Weight: 68.5 kg 73.3 kg    Examination:   Constitutional: NAD, AAOx3, sitting at edge of bed HEENT: conjunctivae and lids normal, EOMI CV: No cyanosis.   RESP: normal respiratory effort, on RA Neuro: II - XII grossly intact.   Psych: Normal mood and affect.   Appropriate judgement and reason   Data Reviewed: I have personally reviewed labs and imaging studies  Time spent: 35 minutes  04/18/2022, MD [facility] If 7PM-7AM, please contact night-coverage 04/16/2022, 5:56 PM

## 2022-04-16 NOTE — Evaluation (Signed)
Physical Therapy Evaluation Patient Details Name: Shuntia Exton MRN: [id number] DOB: 04/03/1961 Today's Date: 04/16/2022  History of Present Illness  This is a 61 y.o. female with a history of a fall 1 day ago and a right trimalleolar ankle fracture. Closed reduction of right ankle - ORIF abandoned due to severe hemorrhagic fracture blisters. Patient will be returning in 10 days for surgery. R LE NWB  Clinical Impression  Patient received sitting on side of bed eating lunch. She is agreeable to PT session. She transfers with cues and min A to power up from bed. Patient is able to hop using RW and min A/min guard for 10 feet. She will continue to benefit from skilled PT to improve safety with mobility. She reports her daughter cannot pick her up today as she is at a holiday dinner. Will continue to follow.          Recommendations for follow up therapy are one component of a multi-disciplinary discharge planning process, led by the attending physician.  Recommendations may be updated based on patient status, additional functional criteria and insurance authorization.  Follow Up Recommendations Home health PT      Assistance Recommended at Discharge Frequent or constant Supervision/Assistance  Patient can return home with the following  A little help with walking and/or transfers;A little help with bathing/dressing/bathroom;Assist for transportation;Help with stairs or ramp for entrance;Assistance with cooking/housework    Equipment Recommendations BSC/3in1  Recommendations for Other Services       Functional Status Assessment Patient has had a recent decline in their functional status and demonstrates the ability to make significant improvements in function in a reasonable and predictable amount of time.     Precautions / Restrictions Precautions Precautions: Fall Required Braces or Orthoses: Splint/Cast Splint/Cast - Date Prophylactic Dressing Applied (if applicable):  04/15/22 Restrictions Weight Bearing Restrictions: Yes RLE Weight Bearing: Non weight bearing      Mobility  Bed Mobility               General bed mobility comments: NT, patient seated edge of bed    Transfers Overall transfer level: Needs assistance Equipment used: Rolling walker (2 wheels) Transfers: Sit to/from Stand Sit to Stand: Min assist           General transfer comment: patient has difficulty powering up to stand    Ambulation/Gait Ambulation/Gait assistance: Min guard, Min assist Gait Distance (Feet): 10 Feet Assistive device: Rolling walker (2 wheels) Gait Pattern/deviations: Step-to pattern Gait velocity: decr     General Gait Details: patient slightly unsteady wtih ambulating.  Stairs            Wheelchair Mobility    Modified Rankin (Stroke Patients Only)       Balance Overall balance assessment: Needs assistance, History of Falls Sitting-balance support: Feet supported Sitting balance-Leahy Scale: Normal     Standing balance support: Bilateral upper extremity supported, During functional activity, Reliant on assistive device for balance Standing balance-Leahy Scale: Fair Standing balance comment: slightly unsteady with walker hopping, she does have knee scooter from broken foot about a year ago per her report.                             Pertinent Vitals/Pain Pain Assessment Pain Assessment: No/denies pain    Home Living Family/patient expects to be discharged to:: Private residence Living Arrangements: Alone Available Help at Discharge: Family;Friend(s);Available PRN/intermittently Type of Home: House Home Access: Level entry  Home Layout: One level Home Equipment: Rollator (4 wheels);Shower seat - built in;Other (comment);Standard Walker (knee scooter)      Prior Function Prior Level of Function : Independent/Modified Independent;Driving                     Hand Dominance         Extremity/Trunk Assessment   Upper Extremity Assessment Upper Extremity Assessment: Overall WFL for tasks assessed    Lower Extremity Assessment Lower Extremity Assessment: RLE deficits/detail RLE: Unable to fully assess due to immobilization RLE Coordination: decreased gross motor    Cervical / Trunk Assessment Cervical / Trunk Assessment: Normal  Communication   Communication: No difficulties  Cognition Arousal/Alertness: Awake/alert Behavior During Therapy: WFL for tasks assessed/performed Overall Cognitive Status: Within Functional Limits for tasks assessed                                          General Comments      Exercises     Assessment/Plan    PT Assessment Patient needs continued PT services  PT Problem List Decreased strength;Decreased activity tolerance;Decreased balance;Decreased mobility;Decreased safety awareness       PT Treatment Interventions DME instruction;Therapeutic exercise;Gait training;Balance training;Functional mobility training;Therapeutic activities;Patient/family education    PT Goals (Current goals can be found in the Care Plan section)  Acute Rehab PT Goals Patient Stated Goal: to go home, her daughter, granddaughter and friends will be able to help PT Goal Formulation: With patient Time For Goal Achievement: 04/23/22 Potential to Achieve Goals: Good    Frequency 7X/week     Co-evaluation               AM-PAC PT "6 Clicks" Mobility  Outcome Measure Help needed turning from your back to your side while in a flat bed without using bedrails?: None Help needed moving from lying on your back to sitting on the side of a flat bed without using bedrails?: None Help needed moving to and from a bed to a chair (including a wheelchair)?: A Little Help needed standing up from a chair using your arms (e.g., wheelchair or bedside chair)?: A Little Help needed to walk in hospital room?: A Lot Help needed climbing 3-5  steps with a railing? : A Lot 6 Click Score: 18    End of Session Equipment Utilized During Treatment: Gait belt Activity Tolerance: Patient tolerated treatment well Patient left: in bed;with call bell/phone within reach Nurse Communication: Mobility status PT Visit Diagnosis: Unsteadiness on feet (R26.81);Other abnormalities of gait and mobility (R26.89);History of falling (Z91.81);Difficulty in walking, not elsewhere classified (R26.2)    Time: 5993-5701 PT Time Calculation (min) (ACUTE ONLY): 14 min   Charges:   PT Evaluation $PT Eval Moderate Complexity: 1 Mod          Jakeim Sedore, PT, GCS 04/16/22,2:25 PM

## 2022-04-16 NOTE — Progress Notes (Addendum)
B/P is 145/94. Pt. Appears to be resting well and Pt. Denies c/o. Will also keep Pt. On continuous Pulse Ox. While sleeping since she is receiving PRN Morphine. Circulation assessment to Right Foot WNL.

## 2022-04-16 NOTE — Progress Notes (Signed)
  Subjective:  Patient ID: Lisa Galvan, female    DOB: 11-03-1960,  MRN: [id number]  Feeling OK. Pain is improving  Negative for chest pain and shortness of breath Fever: no Night sweats: no Objective:   Vitals:   04/16/22 1123 04/16/22 1610  BP: (!) 165/113 (!) 141/91  Pulse: 89 93  Resp: 20 18  Temp: 97.8 F (36.6 C) 98.3 F (36.8 C)  SpO2: 100% 95%   General AA&O x3. Normal mood and affect.  Vascular Foot WWP Brisk capillary refill to all digits. Pedal hair present. No signs DVT  Neurologic Epicritic sensation grossly intact.  Dermatologic Splint c/d/i  Orthopedic: Motor intact to toes    Assessment & Plan:  Patient was evaluated and treated and all questions answered.  R ankle trimalleolar fx -continue NWB RLE -Ice and elevate at home -Continue lovenox at home. Do not take day before and day of surgery -We will arrange outpatient ORIF -Pain control at home - no NSAIDs  Edwin Cap, DPM  Accessible via secure chat for questions or concerns.

## 2022-04-17 ENCOUNTER — Telehealth: Payer: Self-pay | Admitting: *Deleted

## 2022-04-17 ENCOUNTER — Telehealth: Payer: Self-pay | Admitting: Podiatry

## 2022-04-17 ENCOUNTER — Telehealth: Payer: Self-pay | Admitting: Family Medicine

## 2022-04-17 LAB — GLUCOSE, CAPILLARY: Glucose-Capillary: 153 mg/dL — ABNORMAL HIGH (ref 70–99)

## 2022-04-17 MED ORDER — METOPROLOL TARTRATE 25 MG PO TABS: 25.0000 mg | ORAL_TABLET | Freq: Two times a day (BID) | ORAL | 2 refills | Status: DC

## 2022-04-17 MED ORDER — ENOXAPARIN SODIUM 40 MG/0.4ML IJ SOSY: 40.0000 mg | PREFILLED_SYRINGE | INTRAMUSCULAR | 0 refills | Status: DC

## 2022-04-17 MED ORDER — SUCRALFATE 1 G PO TABS: 1.0000 g | ORAL_TABLET | Freq: Four times a day (QID) | ORAL | 1 refills | Status: DC | PRN

## 2022-04-17 MED ORDER — FEXOFENADINE HCL 180 MG PO TABS: 180.0000 mg | ORAL_TABLET | Freq: Every day | ORAL | Status: DC | PRN

## 2022-04-17 MED ORDER — OXYCODONE HCL 5 MG PO TABS: 5.0000 mg | ORAL_TABLET | ORAL | 0 refills | Status: AC | PRN

## 2022-04-17 MED ORDER — DULOXETINE HCL 60 MG PO CPEP: 60.0000 mg | ORAL_CAPSULE | Freq: Every day | ORAL | Status: DC

## 2022-04-17 NOTE — Progress Notes (Signed)
       CROSS COVER NOTE  NAME: Lisa Galvan MRN: [id number] DOB : March 01, 1961 ATTENDING PHYSICIAN: Darlin Priestly, MD   Notified by nursing that patient has fallen out of bed around 0115.Marland Kitchen  No loss of consciousness or change in sensorium that precipitated the fall.  Staff report patient's SCD connectors were caught under wheel of walker. Patient is not complaining of any pain and there is no visible sign of injury on assessment. The fall was unwitnessed and patient reports point of contact with ground was her buttocks. We will continue to monitor and consider additional testing if patient begins to exhibit any symptoms of  pain or change from neurological baseline.   To reach the provider On-Call:   7AM- 7PM see care teams to locate the attending and reach out to them via [email or web address]. 7PM-7AM contact night-coverage If you still have difficulty reaching the appropriate provider, please page the [facility] (Director on Call) for [facility] on amion for assistance  This document was prepared using Sales executive software and may include unintentional dictation errors.  Bishop Limbo DNP, MBA, FNP-BC Nurse Practitioner [facility] Pager [phone]

## 2022-04-17 NOTE — Patient Outreach (Signed)
  Care Coordination   04/17/2022 Name: Lisa Galvan MRN: [id number] DOB: [birth year]   Care Coordination Outreach Attempts:  Notified that patient called PCP office to inform RNCM of ankle injury.  Call placed to patient, no answer, voice message left.  Noted that she remains in the hospital, was admitted on 12/23 after a fall causing a broken ankle requiring surgery.  Will anticipate TOC team to follow up post discharge.  She is scheduled with this RNCM on 1/12.  Follow Up Plan:  Additional outreach attempts will be made to offer the patient care coordination information and services.   Encounter Outcome:  No Answer   Care Coordination Interventions:  No, not indicated    Kemper Durie, RN, MSN, [facility] Care Management Care Management Coordinator [phone]

## 2022-04-17 NOTE — Progress Notes (Signed)
   04/17/22 0112  What Happened  Was fall witnessed? No  Was patient injured? No  Patient found on floor  Found by Staff-comment  Stated prior activity ambulating-unassisted  Follow Up  Family notified No - patient refusal  Simple treatment Other (comment) (assisted back to bed, vitals performed and pt assessed)  Adult Fall Risk Assessment  Risk Factor Category (scoring not indicated) Fall has occurred during this admission (document High fall risk);High fall risk per protocol (document High fall risk)  Patient Fall Risk Level High fall risk  Adult Fall Risk Interventions  Required Bundle Interventions *See Row Information* High fall risk - low, moderate, and high requirements implemented  Additional Interventions Room near nurses station;Use of appropriate toileting equipment (bedpan, BSC, etc.)  Screening for Fall Injury Risk (To be completed on HIGH fall risk patients) - Assessing Need for Floor Mats  Risk For Fall Injury- Criteria for Floor Mats Admitted as a result of a fall  Pain Assessment  Pain Scale 0-10  Pain Score 0  Neurological  Neuro (WDL) WDL  Level of Consciousness Alert  Orientation Level Oriented X4  Glasgow Coma Scale  Eye Opening 4  Best Verbal Response (NON-intubated) 5  Best Motor Response 6  Musculoskeletal  Musculoskeletal (WDL) X  Assistive Device BSC;Front wheel walker  Weight Bearing Restrictions Yes  RLE Weight Bearing NWB  Musculoskeletal Details  RLE Limited movement;Ortho/Supportive Device;Injury/trauma  RLE Ortho/Supportive Device Ace wrap;Splint  Right Ankle Limited movement  Right Ankle Ortho/Supportive Device Ace wrap;Splint  Integumentary  Integumentary (WDL) WDL  Skin Integrity Intact   Vitals taken at 0115 BP 176/106 T-98.1, O2 98%, P- 96. At 0112 RN heard loud noise coming from pt room. RN opened door and observed pt on her knees with her hands on her walker. Pt stated she "heard a noise at her door" and went to open it. She states "  when she was turning back around to get back in the bed the cord rolled under her wheel and she fell on her butt". 2 Rns assisted pt back to bed, pt states she is not injured and no obvious injuries noted by RN. Vitals taken. MD notified. Pt refused family notification. Pt moved in front of nurses station.

## 2022-04-17 NOTE — Discharge Summary (Signed)
Physician Discharge Summary   Lisa Galvan  female DOB: May 07, 1960  [id number]  PCP: [name], DO  Admit date: 04/14/2022 Discharge date: 04/17/2022  Admitted From: home Disposition:  home Home Health: Yes CODE STATUS: Full code  Discharge Instructions     Discharge instructions   Complete by: As directed    Podiatry will schedule you for ankle surgery.  Keep the dressing intact until your surgery.  Please give yourself Lovenox injection to help prevent blood clots, but don't take on the day before your surgery.  Your blood pressure has been high, so I have started you on Lopressor 25 mg twice daily.   Dr. Enzo Bi - -   No wound care   Complete by: As directed       Hospital Course:  For full details, please see H&P, progress notes, consult notes and ancillary notes.  Briefly,  Lisa Galvan is a 61 y.o. female with medical history significant of Dm2 with neuropathy, Chronic sciatica, OSA presented with fall after catching her foot on runner while walking towards her door.    R Trimaleolar ankle fracture --ORIF abandoned due to severe hemorrhagic fracture blisters. --Podiatry will plan for outpatient ORIF in about 10 days --discharged on lovenox for DVT ppx, stop on the day before surgery. --PT eval cleared pt for discharge home with HH and BSC   Dm2 --cont home metformin --no need for BG checks   Diabetic neuropathy Cont Gabapentin 300mg  po am, and 600mg  po qhs   Hyperlipidemia --cont statin   Gerd --cont PPI  HTN --BP elevated during hospitalization.  No BP medication on home med list.  Pt was started on Lopressor 25 mg BID.   Discharge Diagnoses:  Principal Problem:   Trimalleolar fracture of ankle, closed, right, initial encounter Active Problems:   Diabetic peripheral neuropathy ([place])   Closed right trimalleolar fracture   30 Day Unplanned Readmission Risk Score    Flowsheet Row ED to Hosp-Admission (Current) from 04/14/2022  in [place]  30 Day Unplanned Readmission Risk Score (%) 13.32 Filed at 04/17/2022 0801       This score is the patient's risk of an unplanned readmission within 30 days of being discharged (0 -100%). The score is based on dignosis, age, lab data, medications, orders, and past utilization.   Low:  0-14.9   Medium: 15-21.9   High: 22-29.9   Extreme: 30 and above         Discharge Instructions:  Allergies as of 04/17/2022       Reactions   Benazepril Swelling   Face and mouth swelling.   Ivp Dye [iodinated Contrast Media] Anaphylaxis   Patient unsure of reaction but in case of reaction d/t shellfish allergy   Lidocaine Rash   02-01-2021: Patient had lidocaine patch applied to chest at sight of injury and she experienced a rash, whelping, and swelling. Resolved after removing the patch   Lyrica [pregabalin] Swelling   Face/ mouth swelled up balloon   Shellfish Allergy Anaphylaxis, Swelling   Betadine on skin is okay   Shellfish-derived Products Anaphylaxis   Trulicity [dulaglutide] Swelling   Face and mouth swelled up   Ace Inhibitors Swelling        Medication List     STOP taking these medications    ibuprofen 800 MG tablet Commonly known as: ADVIL   ketoconazole 2 % cream Commonly known as: NIZORAL   montelukast 10 MG tablet Commonly known as: SINGULAIR  potassium chloride 10 MEQ tablet Commonly known as: KLOR-CON M       TAKE these medications    Accu-Chek Softclix Lancets lancets TEST TWICE DAILY   aspirin EC 81 MG tablet Take 81 mg by mouth daily. Swallow whole.   atorvastatin 20 MG tablet Commonly known as: LIPITOR Take 1 tablet (20 mg total) by mouth daily. TAKE 1 TABLET(20 MG) BY MOUTH DAILY   cyanocobalamin 1000 MCG tablet Commonly known as: VITAMIN B12 Take 1,000 mcg by mouth 2 (two) times daily.   DULoxetine 60 MG capsule Commonly known as: CYMBALTA Take 1 capsule (60 mg total) by mouth daily. Home  med. What changed:  additional instructions Another medication with the same name was removed. Continue taking this medication, and follow the directions you see here.   enoxaparin 40 MG/0.4ML injection Commonly known as: LOVENOX Inject 0.4 mLs (40 mg total) into the skin daily for 14 days. Start taking on: April 18, 2022   EPINEPHrine 0.3 mg/0.3 mL Soaj injection Commonly known as: EPI-PEN Inject 0.3 mg into the muscle as needed for anaphylaxis.   fexofenadine 180 MG tablet Commonly known as: Allergy Relief Take 1 tablet (180 mg total) by mouth daily as needed. Home med. What changed:  when to take this reasons to take this additional instructions   gabapentin 300 MG capsule Commonly known as: NEURONTIN TAKE 2 CAPSULES(600 MG) BY MOUTH at BEDTIME and 1 CAPSULE (300MG ) In the AM and PM   magnesium gluconate 500 MG tablet Commonly known as: MAGONATE Take 500 mg by mouth 2 (two) times daily.   metFORMIN 500 MG tablet Commonly known as: GLUCOPHAGE Take 2 tablets (1,000 mg total) by mouth 2 (two) times daily with a meal.   metoprolol tartrate 25 MG tablet Commonly known as: LOPRESSOR Take 1 tablet (25 mg total) by mouth 2 (two) times daily.   omeprazole 40 MG capsule Commonly known as: PRILOSEC TAKE 1 CAPSULE(40 MG) BY MOUTH BID   oxyCODONE 5 MG immediate release tablet Commonly known as: Oxy IR/ROXICODONE Take 1 tablet (5 mg total) by mouth every 4 (four) hours as needed for up to 5 days for moderate pain.   sucralfate 1 g tablet Commonly known as: CARAFATE Take 1 tablet (1 g total) by mouth 4 (four) times daily as needed. Home med. What changed:  when to take this reasons to take this additional instructions   Vitamin D-3 125 MCG (5000 UT) Tabs Take 5,000 Units by mouth daily.               Durable Medical Equipment  (From admission, onward)           Start     Ordered   04/16/22 1430  For home use only DME standard manual wheelchair with seat  cushion  Once       Comments: Patient suffers from ankle fracture which impairs their ability to perform daily activities like bathing, dressing, feeding, grooming, and toileting in the home.  A walker will not resolve issue with performing activities of daily living. A wheelchair will allow patient to safely perform daily activities. Patient can safely propel the wheelchair in the home or has a caregiver who can provide assistance. Length of need 6 months . Accessories: elevating leg rests (ELRs), wheel locks, extensions and anti-tippers.   04/16/22 1430   04/16/22 1429  For home use only DME Bedside commode  Once       Question:  Patient needs a bedside commode to treat  with the following condition  Answer:  Falls frequently   04/16/22 1429   04/16/22 1357  For home use only DME Bedside commode  Once       Question:  Patient needs a bedside commode to treat with the following condition  Answer:  Closed right ankle fracture   04/16/22 1357             Follow-up Information     McDonald, Rachelle Hora, DPM Follow up.   Specialty: Podiatry Contact information: [address] [place] [address] [phone]                 Allergies  Allergen Reactions   Benazepril Swelling    Face and mouth swelling.   Ivp Dye [Iodinated Contrast Media] Anaphylaxis    Patient unsure of reaction but in case of reaction d/t shellfish allergy   Lidocaine Rash    02-01-2021: Patient had lidocaine patch applied to chest at sight of injury and she experienced a rash, whelping, and swelling. Resolved after removing the patch   Lyrica [Pregabalin] Swelling    Face/ mouth swelled up balloon   Shellfish Allergy Anaphylaxis and Swelling    Betadine on skin is okay   Shellfish-Derived Products Anaphylaxis   Trulicity [Dulaglutide] Swelling    Face and mouth swelled up   Ace Inhibitors Swelling     The results of significant diagnostics from this hospitalization (including imaging,  microbiology, ancillary and laboratory) are listed below for reference.   Consultations:   Procedures/Studies: DG Ankle 2 Views Right  Result Date: 04/15/2022 CLINICAL DATA:  Status post closed reduction. EXAM: RIGHT ANKLE - 2 VIEW COMPARISON:  04/14/2022 FINDINGS: Fine detail obscured by overlying fiberglass. The fracture dislocation seen on yesterday's exam has been reduced in the interval with marked improvement in bony alignment. IMPRESSION: Marked improvement in bony alignment status post closed reduction. Electronically Signed   By: Kennith Center M.D.   On: 04/15/2022 10:28   DG C-Arm 1-60 Min-No Report  Result Date: 04/15/2022 Fluoroscopy was utilized by the requesting physician.  No radiographic interpretation.   US Venous Img Lower Unilateral Right (DVT)  Result Date: 04/15/2022 CLINICAL DATA:  Right leg pain EXAM: Right LOWER EXTREMITY VENOUS DOPPLER ULTRASOUND TECHNIQUE: Gray-scale sonography with compression, as well as color and duplex ultrasound, were performed to evaluate the deep venous system(s) from the level of the common femoral vein through the popliteal and proximal calf veins. COMPARISON:  None Available. FINDINGS: VENOUS Normal compressibility of the common femoral, superficial femoral, and popliteal veins. Unable to visualize the calf veins due to overlying cast. Visualized portions of profunda femoral vein and great saphenous vein unremarkable. No filling defects to suggest DVT on grayscale or color Doppler imaging. Doppler waveforms show normal direction of venous flow, normal respiratory plasticity and response to augmentation. Limited views of the contralateral common femoral vein are unremarkable. OTHER None. Limitations: Cast about the calf/ankle. IMPRESSION: No evidence of DVT in the visualized veins. The calf veins were not visualized due to overlying cast. Electronically Signed   By: Minerva Fester M.D.   On: 04/15/2022 01:58   X-ray chest PA and  lateral  Result Date: 04/15/2022 CLINICAL DATA:  Preop for right ankle fracture EXAM: CHEST - 2 VIEW COMPARISON:  01/31/2021 FINDINGS: The heart size and mediastinal contours are within normal limits. Both lungs are clear. The visualized skeletal structures are unremarkable. IMPRESSION: No active cardiopulmonary disease. Electronically Signed   By: Minerva Fester M.D.   On:  04/15/2022 00:19   CT Ankle Right Wo Contrast  Result Date: 04/14/2022 CLINICAL DATA:  Right ankle fracture dislocation. EXAM: CT OF THE RIGHT ANKLE WITHOUT CONTRAST TECHNIQUE: Multidetector CT imaging of the right ankle was performed according to the standard protocol. Multiplanar CT image reconstructions were also generated. RADIATION DOSE REDUCTION: This exam was performed according to the departmental dose-optimization program which includes automated exposure control, adjustment of the mA and/or kV according to patient size and/or use of iterative reconstruction technique. COMPARISON:  Right ankle radiographs 7:51 p.m. FINDINGS: Bones/Joint/Cartilage Tibiotalar alignment has been restored since prior examination. Trimalleolar right ankle fracture identified. Oblique, comminuted fracture of the distal fibular metaphyseal region extending to the level of the tibial plafond has been partially reduced now in near anatomic alignment with approximately 5 mm lateral displacement and 5 mm override of the major distal fracture fragment. Medial malleolar fracture again identified with mild comminution. Distal fracture fragment demonstrates near anatomic alignment and 2 mm lateral displacement of the distal fracture fragment. Posterior malleolar fracture fragment identified comprising 10-15% of the articular surface. Fracture fragment is in near anatomic alignment with a 3 mm articular gap within the a distal tibial articular surface. 8 mm ossific fracture fragment seen anterior to the tibiotalar articulation arising from the a anterior  articular surface of the distal tibia. Fracture fragment comprises less than 10% of the distal tibial articular surface. There is, additionally, cortical irregularity involving the superolateral periarticular aspect of the talar dome best seen on axial image # 134/4 and coronal image # 62/5 in keeping with a probable infraction fracture. Ligaments Suboptimally assessed by CT. Muscles and Tendons Calcification involving the peroneus brevis tendon at the level of the crossing peroneal longus tendon may relate to remote trauma or inflammation. No other abnormality seen. Soft tissues Extensive subcutaneous edema and or interstitial hemorrhage. No loculated subcutaneous fluid collection identified. IMPRESSION: 1. Trimalleolar right ankle fracture as described above. Tibiotalar alignment has been restored since prior examination. 2. Cortical irregularity involving the superolateral periarticular aspect of the talar dome in keeping with a probable infraction fracture. 3. Extensive subcutaneous edema and or interstitial hemorrhage. No loculated subcutaneous fluid collection identified. 4. Calcification involving the peroneus brevis tendon at the level of the crossing peroneal longus tendon may relate to remote trauma or inflammation. Electronically Signed   By: Fidela Salisbury M.D.   On: 04/14/2022 22:54   DG Foot Complete Right  Result Date: 04/14/2022 CLINICAL DATA:  Fall, right ankle injury EXAM: RIGHT FOOT COMPLETE - 3+ VIEW COMPARISON:  None Available. FINDINGS: Comminuted bimalleolar fracture of the right ankle with posterolateral tibiotalar dislocation is better assessed on accompanying radiographs of the right ankle. Osseous structures of the right foot are intact. No fracture or dislocation involving the right foot. Joint spaces are preserved within the right foot. Tiny plantar calcaneal spur. Soft tissues of the right foot are unremarkable. IMPRESSION: 1. Comminuted bimalleolar fracture of the right ankle with  posterolateral tibiotalar dislocation. 2. No fracture or dislocation of the right foot. Electronically Signed   By: Fidela Salisbury M.D.   On: 04/14/2022 20:16   DG Ankle Complete Right  Result Date: 04/14/2022 CLINICAL DATA:  Fall, right ankle injury EXAM: RIGHT ANKLE - COMPLETE 3+ VIEW COMPARISON:  None Available. FINDINGS: Acute bimalleolar fracture dislocation of the right ankle. Oblique fracture of the distal right fibular metadiaphysis to the level of the tibial plafond with 1/2 shaft with lateral and 1 shaft with posterior displacement and moderate posterior angulation of the  distal fracture fragment. Transverse, avulsive type fracture of the medial malleolus at the level of the tibial plafond with 8 mm medial displacement of the distal fracture fragment. Acute intra-articular fracture of the anterior articular aspect of the distal tibia with approximately 6-7 mm posterior displacement of the distal fracture fragment into the joint space. Fracture fragment comprises less than 10% of the articular surface. Posterolateral dislocation of the talar dome in relation to the tibial plafond. Extensive bimalleolar soft tissue swelling. IMPRESSION: 1. Acute bimalleolar fracture dislocation of the right ankle as described above. Electronically Signed   By: Fidela Salisbury M.D.   On: 04/14/2022 20:15      Labs: BNP (last 3 results) No results for input(s): "BNP" in the last 8760 hours. Basic Metabolic Panel: Recent Labs  Lab 04/14/22 2200 04/15/22 0439 04/16/22 0645  NA 137 141 136  K 3.8 4.1 4.0  CL 102 107 103  CO2 26 25 25   GLUCOSE 145* 122* 188*  BUN 6* 6* 11  CREATININE 0.67 0.61 0.82  CALCIUM 9.4 8.8* 9.4  MG  --   --  1.8   Liver Function Tests: Recent Labs  Lab 04/14/22 2200  AST 22  ALT 14  ALKPHOS 58  BILITOT 1.1  PROT 7.2  ALBUMIN 4.2   No results for input(s): "LIPASE", "AMYLASE" in the last 168 hours. No results for input(s): "AMMONIA" in the last 168  hours. CBC: Recent Labs  Lab 04/14/22 2200 04/15/22 0439 04/16/22 0645  WBC 7.6 7.6 8.4  NEUTROABS 4.6  --   --   HGB 12.9 11.5* 11.8*  HCT 38.3 34.5* 35.1*  MCV 83.4 83.9 83.2  PLT 272 241 217   Cardiac Enzymes: No results for input(s): "CKTOTAL", "CKMB", "CKMBINDEX", "TROPONINI" in the last 168 hours. BNP: Invalid input(s): "POCBNP" CBG: Recent Labs  Lab 04/15/22 0246 04/15/22 0404 04/15/22 1024 04/17/22 0316  GLUCAP 112* 120* 103* 153*   D-Dimer No results for input(s): "DDIMER" in the last 72 hours. Hgb A1c No results for input(s): "HGBA1C" in the last 72 hours. Lipid Profile No results for input(s): "CHOL", "HDL", "LDLCALC", "TRIG", "CHOLHDL", "LDLDIRECT" in the last 72 hours. Thyroid function studies No results for input(s): "TSH", "T4TOTAL", "T3FREE", "THYROIDAB" in the last 72 hours.  Invalid input(s): "FREET3" Anemia work up No results for input(s): "VITAMINB12", "FOLATE", "FERRITIN", "TIBC", "IRON", "RETICCTPCT" in the last 72 hours. Urinalysis    Component Value Date/Time   COLORURINE YELLOW (A) 12/11/2021 0925   APPEARANCEUR HAZY (A) 12/11/2021 0925   APPEARANCEUR Clear 03/06/2021 0944   LABSPEC 1.011 12/11/2021 0925   PHURINE 5.0 12/11/2021 0925   GLUCOSEU NEGATIVE 12/11/2021 0925   HGBUR NEGATIVE 12/11/2021 0925   BILIRUBINUR NEGATIVE 12/11/2021 0925   BILIRUBINUR Negative 03/06/2021 0944   KETONESUR NEGATIVE 12/11/2021 0925   PROTEINUR NEGATIVE 12/11/2021 0925   NITRITE NEGATIVE 12/11/2021 0925   LEUKOCYTESUR SMALL (A) 12/11/2021 0925   Sepsis Labs Recent Labs  Lab 04/14/22 2200 04/15/22 0439 04/16/22 0645  WBC 7.6 7.6 8.4   Microbiology No results found for this or any previous visit (from the past 240 hour(s)).   Total time spend on discharging this patient, including the last patient exam, discussing the hospital stay, instructions for ongoing care as it relates to all pertinent caregivers, as well as preparing the medical  discharge records, prescriptions, and/or referrals as applicable, is 45 minutes.    Enzo Bi, MD  [facility] 04/17/2022, 9:50 AM
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
## 2022-04-17 NOTE — Telephone Encounter (Signed)
Copied from CRM [id number]. Topic: General - Other >> Apr 17, 2022 12:52 PM Ja-Kwan M wrote: Reason for CRM: Pt requests that [place] be made aware that she broke her ankle. Pt requests that Pam return her call.

## 2022-04-17 NOTE — Progress Notes (Addendum)
Physical Therapy Treatment Patient Details Name: Lisa Galvan MRN: [id number] DOB: Sep 16, 1960 Today's Date: 04/17/2022   History of Present Illness This is a 61 y.o. female with a history of a fall 1 day ago and a right trimalleolar ankle fracture. Closed reduction of right ankle - ORIF abandoned due to severe hemorrhagic fracture blisters. Patient will be returning in 10 days for surgery. R LE NWB    PT Comments    Pt in bed, ready for session.  Stated she did fall last night.  Stated she did have staff with her and she fell but did not get hurt.  Chart stated unwitnessed.  She does not seem to be a good historian and is somewhat scattered during session and difficult to educate.  Bed mobility without assist.  She is able to transfer to [facility] with set up and min guard/supervision.  No assist for pericare and is able to transfer back to bed with min guard.  Discussed being wheelchair level due to NWB, frequent falls at home and she will be home alone at times during the day.  She continued to state "I'll just walk a little bit."  Again stressed need for wheelchair level.  She has a stnd walker at home which she prefers stating she fell because of wheels on RW.  She also stated she has a rollator at home and is advised against using it.  Voiced understanding.  She initially declined gait with RW but upon leaving room she wanted to try.  She is able to stand with RW and walk 10' in room.  She is ,min guard with cues to not hop too far into walker box and general safety.  She is at increased risk for falls if she tries to use walker unattended and is again urged to be wheelchair level at home.  Educated there is a big difference between no gait and "just a little bit."  She voiced understanding at end of session.  Strongly encouraged wheelchair level at home with no unassisted gait.  RN in room for pain meds and care.     Patient suffers from NWB ankle fracture and frequent falls which impairs his/her  ability to perform daily activities like toileting, feeding, dressing, grooming, bathing in the home. A cane, walker, crutch will not resolve the patient's issue with performing activities of daily living. A lightweight wheelchair and cushion is required/recommended and will allow patient to safely perform daily activities.   Patient can safely propel the wheelchair in the home or has a caregiver who can provide assistance.    Recommendations for follow up therapy are one component of a multi-disciplinary discharge planning process, led by the attending physician.  Recommendations may be updated based on patient status, additional functional criteria and insurance authorization.  Follow Up Recommendations  Home health PT     Assistance Recommended at Discharge Frequent or constant Supervision/Assistance  Patient can return home with the following A little help with walking and/or transfers;A little help with bathing/dressing/bathroom;Assist for transportation;Help with stairs or ramp for entrance;Assistance with cooking/housework   Equipment Recommendations  BSC/3in1;Wheelchair (measurements PT);Wheelchair cushion (measurements PT)    Recommendations for Other Services       Precautions / Restrictions Precautions Precautions: Fall Required Braces or Orthoses: Splint/Cast Splint/Cast - Date Prophylactic Dressing Applied (if applicable): 04/15/22 Restrictions Weight Bearing Restrictions: Yes RLE Weight Bearing: Non weight bearing     Mobility  Bed Mobility Overal bed mobility: Independent  Transfers Overall transfer level: Needs assistance Equipment used: Rolling walker (2 wheels), None Transfers: Sit to/from Stand, Bed to chair/wheelchair/BSC Sit to Stand: Min guard     Squat pivot transfers: Min guard     General transfer comment: time to figue out hand placements but overall is able to do with supervision/min guard     Ambulation/Gait Ambulation/Gait assistance: Min guard, Min assist Gait Distance (Feet): 10 Feet Assistive device: Rolling walker (2 wheels) Gait Pattern/deviations: Step-to pattern Gait velocity: decr     General Gait Details: patient slightly unsteady wtih ambulating. cues for proper walker placement   Stairs             Wheelchair Mobility    Modified Rankin (Stroke Patients Only)       Balance Overall balance assessment: Needs assistance, History of Falls Sitting-balance support: Feet supported Sitting balance-Leahy Scale: Normal     Standing balance support: Bilateral upper extremity supported, During functional activity, Reliant on assistive device for balance Standing balance-Leahy Scale: Fair Standing balance comment: slightly unsteady with walker hopping, she does have knee scooter from broken foot about a year ago per her report.                            Cognition Arousal/Alertness: Awake/alert Behavior During Therapy: WFL for tasks assessed/performed Overall Cognitive Status: Within Functional Limits for tasks assessed                                 General Comments: difficult to edcuate -        Exercises      General Comments        Pertinent Vitals/Pain Pain Assessment Pain Assessment: 0-10 Pain Score: 2  Pain Location: ankle Pain Descriptors / Indicators: Sore Pain Intervention(s): Patient requesting pain meds-RN notified, Ice applied, Repositioned    Home Living                          Prior Function            PT Goals (current goals can now be found in the care plan section) Progress towards PT goals: Progressing toward goals    Frequency    7X/week      PT Plan      Co-evaluation              AM-PAC PT "6 Clicks" Mobility   Outcome Measure  Help needed turning from your back to your side while in a flat bed without using bedrails?: None Help needed moving from  lying on your back to sitting on the side of a flat bed without using bedrails?: None Help needed moving to and from a bed to a chair (including a wheelchair)?: A Little Help needed standing up from a chair using your arms (e.g., wheelchair or bedside chair)?: A Little Help needed to walk in hospital room?: A Little Help needed climbing 3-5 steps with a railing? : A Lot 6 Click Score: 19    End of Session Equipment Utilized During Treatment: Gait belt Activity Tolerance: Patient tolerated treatment well Patient left: in bed;with call bell/phone within reach Nurse Communication: Mobility status PT Visit Diagnosis: Unsteadiness on feet (R26.81);Other abnormalities of gait and mobility (R26.89);History of falling (Z91.81);Difficulty in walking, not elsewhere classified (R26.2)     Time: 0093-8182 PT Time Calculation (min) (ACUTE ONLY):  25 min  Charges:  $Gait Training: 8-22 mins $Therapeutic Activity: 8-22 mins                   Danielle Dess, PTA 04/17/22, 9:04 AM

## 2022-04-17 NOTE — Progress Notes (Signed)
Patient is being discharged with her daughter. EMS transportation canceled. Education on medications and medication administration given. Patient verbalizes understanding. Patient discharged in personal wheelchair and belongings brought to car including BSC. All equipment fit in car and patient sent home.

## 2022-04-17 NOTE — Progress Notes (Cosign Needed)
    Durable Medical Equipment  (From admission, onward)           Start     Ordered   04/16/22 1430  For home use only DME standard manual wheelchair with seat cushion  Once       Comments: Patient suffers from ankle fracture which impairs their ability to perform daily activities like bathing, dressing, feeding, grooming, and toileting in the home.  A walker will not resolve issue with performing activities of daily living. A wheelchair will allow patient to safely perform daily activities. Patient can safely propel the wheelchair in the home or has a caregiver who can provide assistance. Length of need 6 months . Accessories: elevating leg rests (ELRs), wheel locks, extensions and anti-tippers.   04/16/22 1430   04/16/22 1429  For home use only DME Bedside commode  Once       Question:  Patient needs a bedside commode to treat with the following condition  Answer:  Falls frequently   04/16/22 1429   04/16/22 1357  For home use only DME Bedside commode  Once       Question:  Patient needs a bedside commode to treat with the following condition  Answer:  Closed right ankle fracture   04/16/22 1357           Patient is not able to walk the distance required to go the bathroom, or he/she is unable to safely negotiate stairs required to access the bathroom.  A 3in1 BSC will alleviate this problem  Darolyn Rua, [place], MSW, [place] [phone]

## 2022-04-17 NOTE — TOC Progression Note (Addendum)
Transition of Care [facility]) - Progression Note    Patient Details  Name: Lisa Galvan MRN: [id number] Date of Birth: 1960-07-24  [facility]) CM/SW Contact  Darolyn Rua, [place] Phone Number: 04/17/2022, 3:46 PM  Clinical Narrative:     Patient to dc home, per Bonita Quin no family can pick patient up from the hospital and she requests EMS be called.   Bonita Quin is not agreeable to Home Health PT at this time, she reports she would like to speak with patient and get back to this CSW. Aware of copay costs of no less than $150 per discipline per insurance.   EMS forms have been completed and EMS has been called, MD and RN made aware of above.   If patient is discharged without decision on if they are agreeable to home health, patient will need to follow up with PCP for [facility] orders after discharge. Linda informed of this, expressed understanding.  Expected Discharge Plan: Home w Home Health Services Barriers to Discharge: Continued Medical Work up  Expected Discharge Plan and Services         Expected Discharge Date: 04/17/22                                     Social Determinants of Health (SDOH) Interventions SDOH Screenings   Food Insecurity: No Food Insecurity (04/15/2022)  Housing: Low Risk  (04/15/2022)  Transportation Needs: No Transportation Needs (04/15/2022)  Utilities: Not At Risk (04/15/2022)  Alcohol Screen: Low Risk  (12/21/2021)  Depression (PHQ2-9): Low Risk  (03/08/2022)  Financial Resource Strain: High Risk (01/02/2022)  Physical Activity: Inactive (12/14/2020)  Social Connections: Moderately Isolated (12/21/2021)  Stress: Stress Concern Present (12/21/2021)  Tobacco Use: Low Risk  (04/16/2022)    Readmission Risk Interventions     No data to display

## 2022-04-17 NOTE — Telephone Encounter (Signed)
Pt called and is at the hospital and she wanted to let you know they are sending her home with no aftercare and are sending her home right now. Pt is to have surgery next week with you for her broken ankle.

## 2022-04-17 NOTE — Progress Notes (Signed)
Physical Therapy Treatment Patient Details Name: Lisa Galvan MRN: [id number] DOB: 1961-01-19 Today's Date: 04/17/2022   History of Present Illness This is a 61 y.o. female with a history of a fall 1 day ago and a right trimalleolar ankle fracture. Closed reduction of right ankle - ORIF abandoned due to severe hemorrhagic fracture blisters. Patient will be returning in 10 days for surgery. R LE NWB    PT Comments    Re-visit this AM per team request.  HCPOA in room and expressing concerns regarding discharge home.  Arrived in room and [place], [place] in room and attempting to video session without permission or making therapist aware of her intentions.  Asked her not to.   She did ask RN to come into room for a "witness".  She voiced concern over recommendations for HHPT and that she does not have assist at home.  Reviewed earlier that pt demonstrated transfers with min guard/assist to/from [facility] and was able to provide for her own self care in sitting.  Reviewed recommendations for no gait and that wheelchair level was appropriate for discharge.  She is not safe to walk on her own and it was reviewed again that she should not walk on her own.  She asked to see transfer again to [facility].  Pt demonstrated transfer with +1 assist and some cues time for hand placements as she tries to grab on [facility] for transfer which is inconsistent from morning session.  Pt was able to transfer on/off with min guard/min a x 1. Reviewed recommendation for +1 assist but she and pt voiced concerns that she did not have that available as she lived alone.  Asked if there was a family/friend etc who she could stay with or could stay with her or recommended private hired care.    Given insurance guidelines, pt is +1 assist for general safety for transfers that could reasonably be provided by family.  DME has been delivered to room.         Recommendations for follow up therapy are one component of a multi-disciplinary discharge  planning process, led by the attending physician.  Recommendations may be updated based on patient status, additional functional criteria and insurance authorization.  Follow Up Recommendations  Home health PT     Assistance Recommended at Discharge Frequent or constant Supervision/Assistance  Patient can return home with the following A little help with walking and/or transfers;A little help with bathing/dressing/bathroom;Assist for transportation;Help with stairs or ramp for entrance;Assistance with cooking/housework   Equipment Recommendations  BSC/3in1;Wheelchair (measurements PT);Wheelchair cushion (measurements PT)    Recommendations for Other Services       Precautions / Restrictions Precautions Precautions: Fall Required Braces or Orthoses: Splint/Cast Splint/Cast - Date Prophylactic Dressing Applied (if applicable): [id number] Restrictions Weight Bearing Restrictions: Yes RLE Weight Bearing: Non weight bearing     Mobility  Bed Mobility Overal bed mobility: Independent                  Transfers Overall transfer level: Needs assistance Equipment used: None Transfers: Bed to chair/wheelchair/BSC Sit to Stand: Min guard, Min assist     Squat pivot transfers: Min guard     General transfer comment: time to figue out hand placements but overall is able to do with supervision/min guard    Ambulation/Gait Ambulation/Gait assistance: Min guard, Min assist Gait Distance (Feet): 10 Feet Assistive device: Rolling walker (2 wheels) Gait Pattern/deviations: Step-to pattern Gait velocity: decr     General Gait Details: patient slightly  unsteady wtih ambulating. cues for proper walker placement   Stairs             Wheelchair Mobility    Modified Rankin (Stroke Patients Only)       Balance Overall balance assessment: Needs assistance, History of Falls Sitting-balance support: Feet supported Sitting balance-Leahy Scale: Normal     Standing  balance support: Bilateral upper extremity supported, During functional activity, Reliant on assistive device for balance Standing balance-Leahy Scale: Fair Standing balance comment: slightly unsteady with walker hopping, she does have knee scooter from broken foot about a year ago per her report.                            Cognition Arousal/Alertness: Awake/alert Behavior During Therapy: WFL for tasks assessed/performed Overall Cognitive Status: Within Functional Limits for tasks assessed                                 General Comments: difficult to edcuate -        Exercises      General Comments        Pertinent Vitals/Pain Pain Assessment Pain Assessment: 0-10 Pain Score: 4  Pain Location: ankle Pain Descriptors / Indicators: Sore Pain Intervention(s): Limited activity within patient's tolerance, Monitored during session, Repositioned    Home Living                          Prior Function            PT Goals (current goals can now be found in the care plan section) Progress towards PT goals: Progressing toward goals    Frequency    7X/week      PT Plan      Co-evaluation              AM-PAC PT "6 Clicks" Mobility   Outcome Measure  Help needed turning from your back to your side while in a flat bed without using bedrails?: None Help needed moving from lying on your back to sitting on the side of a flat bed without using bedrails?: None Help needed moving to and from a bed to a chair (including a wheelchair)?: A Little Help needed standing up from a chair using your arms (e.g., wheelchair or bedside chair)?: A Little Help needed to walk in hospital room?: A Little Help needed climbing 3-5 steps with a railing? : Total 6 Click Score: 18    End of Session Equipment Utilized During Treatment: Gait belt Activity Tolerance: Patient tolerated treatment well Patient left: in bed;with call bell/phone within  reach Nurse Communication: Mobility status PT Visit Diagnosis: Unsteadiness on feet (R26.81);Other abnormalities of gait and mobility (R26.89);History of falling (Z91.81);Difficulty in walking, not elsewhere classified (R26.2)     Time: 2130-8657 PT Time Calculation (min) (ACUTE ONLY): 25 min  Charges:  $Gait Training: 8-22 mins $Therapeutic Activity: 8-22 mins                   Danielle Dess, PTA 04/17/22, 12:28 PM

## 2022-04-18 ENCOUNTER — Encounter: Admission: RE | Payer: Self-pay | Source: Home / Self Care

## 2022-04-18 ENCOUNTER — Telehealth: Payer: Self-pay | Admitting: *Deleted

## 2022-04-18 ENCOUNTER — Ambulatory Visit
Admission: RE | Admit: 2022-04-18 | Payer: BC Managed Care – PPO | Source: Home / Self Care | Admitting: Gastroenterology

## 2022-04-18 ENCOUNTER — Ambulatory Visit: Payer: BC Managed Care – PPO

## 2022-04-18 ENCOUNTER — Telehealth: Payer: Self-pay | Admitting: Family Medicine

## 2022-04-18 SURGERY — ESOPHAGOGASTRODUODENOSCOPY (EGD) WITH PROPOFOL
Anesthesia: General

## 2022-04-18 NOTE — Patient Outreach (Signed)
  Care Coordination   04/18/2022 Name: Lisa Galvan MRN: [id number] DOB: [birth year]   Care Coordination Outreach Attempts:  Voice message received from patient yesterday.  Call placed as she was discharged home yesterday, no answer, voice message left.   Follow Up Plan:  Additional outreach attempts will be made to offer the patient care coordination information and services.   Encounter Outcome:  No Answer   Care Coordination Interventions:  No, not indicated    Kemper Durie, RN, MSN, [facility] Care Management Care Management Coordinator [phone]

## 2022-04-18 NOTE — Telephone Encounter (Signed)
LVM asking patient to call back to schedule an appointment 

## 2022-04-18 NOTE — Patient Outreach (Signed)
  Care Coordination TOC Note Transition Care Management Unsuccessful Follow-up Telephone Call  Date of discharge and from where:  [facility] [id number]  Attempts:  1st Attempt  Reason for unsuccessful TCM follow-up call:  Left voice message  Ayyan Sites BSN RN [facility] Care Management [phone]   

## 2022-04-18 NOTE — Patient Instructions (Signed)
Visit Information  Thank you for taking time to visit with me today. Please don't hesitate to contact me if I can be of assistance to you before our next scheduled telephone appointment.  Following are the goals we discussed today:  Listen for call from home health team for HHPT. Use wheelchair and do not put weight of foot/ankle.   Our next appointment is by telephone on 1/2  Please call the care guide team at [phone] if you need to cancel or reschedule your appointment.   Please call the Suicide and Crisis Lifeline: 988 call the [place] National Suicide Prevention Lifeline: 240-379-8473 or TTY: 365-730-0579 TTY 409 549 6924) to talk to a trained counselor call 1-800-273-TALK (toll free, 24 hour hotline) call 911 if you are experiencing a Mental Health or Behavioral Health Crisis or need someone to talk to.  Patient verbalizes understanding of instructions and care plan provided today and agrees to view in MyChart. Active MyChart status and patient understanding of how to access instructions and care plan via MyChart confirmed with patient.     The patient has been provided with contact information for the care management team and has been advised to call with any health related questions or concerns.   Kemper Durie, RN, MSN, [facility] Care Management Care Management Coordinator [phone]

## 2022-04-18 NOTE — Telephone Encounter (Signed)
Patient will need appt for home health to be paid for.

## 2022-04-18 NOTE — Patient Outreach (Signed)
  Care Coordination   Follow Up Visit Note   04/18/2022 Name: Lisa Galvan MRN: [id number] DOB: 03/20/1961  Lisa Galvan is a 61 y.o. year old female who sees Dorcas Carrow, DO for primary care. I spoke with  [name] by phone today.  What matters to the patients health and wellness today?  Admitted to hospital 12/23-12/26, broken ankle, surgery scheduled for 1/5.    Goals Addressed             This Visit's Progress    RNCM: Fall prevention and Safety   Not on track    Care Coordination Interventions: Provided written and verbal education re: potential causes of falls and Fall prevention strategies.  Using DME to decrease risk of falling Reviewed medications and discussed potential side effects of medications such as dizziness and frequent urination Advised patient of importance of notifying provider of falls Assessed for falls since last encounter. Last fall was a few weeks ago at church.  She has discussed with provider, they are thinking about enrolling in outpatient PT once she finishes outpatient OT.  Assessed patients knowledge of fall risk prevention secondary to previously provided education. Review with patient to monitor for risk factors and fall risk.  Provided patient information for fall alert systems. Review and patient has a life alert system. She has not set it up yet as she just received. Encouraged her to set up the system, especially since she lives alone. Assessed working status of life alert bracelet and patient adherence Advised patient to discuss fall prevention and safety concerns with provider Patient interviewed about adult health maintenance status including  Falls risk assessment    Fell on 12/23, fractured ankle, will require surgery on 1/5.  She is concerned about ability to care for self adequately in the home, does not have consistent support (daughter having surgery on 1/3, sister has bulging discs in back) Notified PCP of need for HHPT order as this  was not done prior to discharge.  Patient/family was not initially agreeable to copay, will now consider Collaborate with CSW regarding resources for additional help in the home (example, Home care providers).         SDOH assessments and interventions completed:  No     Care Coordination Interventions:  Yes, provided   Follow up plan: Follow up call scheduled for 1/2    Encounter Outcome:  Pt. Visit Completed   Kemper Durie, RN, MSN, [facility] Care Management Care Management Coordinator [phone]

## 2022-04-18 NOTE — Telephone Encounter (Signed)
-----   Message from [place], [place] sent at 04/18/2022 12:03 PM EST ----- Patient needing order for HHPT.  Would like to use [facility] if possible.  Thanks, Kemper Durie, RN, MSN, [facility] Care Management Care Management Coordinator [phone]

## 2022-04-19 ENCOUNTER — Ambulatory Visit: Payer: BC Managed Care – PPO

## 2022-04-19 ENCOUNTER — Telehealth: Payer: Self-pay | Admitting: Podiatry

## 2022-04-19 ENCOUNTER — Telehealth: Payer: Self-pay | Admitting: *Deleted

## 2022-04-19 NOTE — Patient Outreach (Signed)
  Care Coordination [facility] Note Transition Care Management Follow-up Telephone Call Date of discharge and from where: [facility] [phone] How have you been since you were released from the hospital? I have not had anybody to help me. No PT or anything Any questions or concerns? Yes Patient was concerned about not having assistance around home. RN discussed options such as ordering groceries to be delivered. If needed ask for a sliding transfer board to help get from wheelchair to bedside commode. RN explained the difference between occupational therapy and physical therapy.  Patient was concerned that someone had to take some time off from work to take her to her Dr appt tomorrow.  Items Reviewed: Did the pt receive and understand the discharge instructions provided? Yes  Medications obtained and verified? Yes  Other? No  Any new allergies since your discharge? No  Dietary orders reviewed? No Do you have support at home? Yes  some intermittent help  Home Care and Equipment/Supplies: Were home health services ordered? not applicable If so, what is the name of the agency? N.  Has the agency set up a time to come to the patient's home? no Were any new equipment or medical supplies ordered?  No What is the name of the medical supply agency? n Were you able to get the supplies/equipment? no Do you have any questions related to the use of the equipment or supplies? No  Functional Questionnaire: (I = Independent and D = Dependent) ADLs: D  Bathing/Dressing- I  Meal Prep- I  Eating- I  Maintaining continence- I  Transferring/Ambulation- D  Managing Meds- I  Follow up appointments reviewed:  PCP Hospital f/u appt confirmed? Renette Butters PA [phone] 8:20 AM Specialist Hospital f/u appt confirmed? Yes  01/102024 Sharl Ma 10:15 Sharl Ma [phone] 10:45. Are transportation arrangements needed? No For [id number] she has transportation If their condition worsens, is the pt aware to call  PCP or go to the Emergency Dept.? Yes Was the patient provided with contact information for the PCP's office or ED? Yes Was to pt encouraged to call back with questions or concerns? Yes  SDOH assessments and interventions completed:   Yes   Care Coordination Interventions:  Patient is being followed by Kemper Durie    Encounter Outcome:  Pt. Visit Completed    Gean Maidens BSN RN [facility] Care Management [phone]

## 2022-04-19 NOTE — Telephone Encounter (Addendum)
DOS: 04/27/2021  Dr. Milinda Pointer Assisting  [employer]  Open Treatment Ankle Fracture Rt [phone]) Open Treatment Distal Tibiofibular Joint Rt [phone])  Deductible: $300 with $0 met Out-of-Pocket: $3,000 with $1,649 remaining  CoInsurance: 20%  Prior authorization is not required per Claudell Kyle. Call Reference #: [id number]  Prior authorization is not required per [facility] C.  Call Reference #: [id number]

## 2022-04-19 NOTE — Telephone Encounter (Signed)
Called pt to confirm if she will be filling the same insurance for next year that she currently has on file and she said she would be. Patient asked if Dr. Lilian Kapur or her PCP would take care of her recovery location and I told her Dr. Lilian Kapur would. I asked if she lived in a multilevel home and she stated no but that she has nobody to help her in her recovery. I told the patient I would send a message to [facility] and that she would give her a call back.

## 2022-04-20 ENCOUNTER — Encounter: Payer: Self-pay | Admitting: Physician Assistant

## 2022-04-20 ENCOUNTER — Telehealth: Payer: Self-pay | Admitting: Family Medicine

## 2022-04-20 ENCOUNTER — Ambulatory Visit: Payer: BC Managed Care – PPO | Admitting: Physician Assistant

## 2022-04-20 VITALS — BP 132/86 | HR 97 | Temp 98.5°F | Wt 151.0 lb

## 2022-04-20 DIAGNOSIS — S82851K Displaced trimalleolar fracture of right lower leg, subsequent encounter for closed fracture with nonunion: Secondary | ICD-10-CM

## 2022-04-20 DIAGNOSIS — G4733 Obstructive sleep apnea (adult) (pediatric): Secondary | ICD-10-CM | POA: Diagnosis not present

## 2022-04-20 DIAGNOSIS — S82851A Displaced trimalleolar fracture of right lower leg, initial encounter for closed fracture: Secondary | ICD-10-CM
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
## 2022-04-20 NOTE — Telephone Encounter (Signed)
Please review. Message was sent to the wrong office. ? ?KP

## 2022-04-20 NOTE — Telephone Encounter (Signed)
Copied from CRM #[id number]. Topic: General - Other >> Apr 20, 2022 10:16 AM Victoria B wrote: Reason for CRM: Patient called in states form was faxed over to Dr Johnson to sign for sher to get shower chair, but she says they had to make it a 3 in 1 with toilet and requesting if Dr Johnson can get form signed today so she can get it before the holiday. 

## 2022-04-20 NOTE — Telephone Encounter (Signed)
Copied from CRM [id number]. Topic: General - Other >> Apr 20, 2022 10:16 AM Turkey B wrote: Reason for CRM: Patient called in states form was faxed over to Dr Laural Benes to sign for sher to get shower chair, but she says they had to make it a 3 in 1 with toilet and requesting if Dr Laural Benes can get form signed today so she can get it before the holiday.

## 2022-04-20 NOTE — Telephone Encounter (Signed)
Please check to see if we have form

## 2022-04-20 NOTE — Telephone Encounter (Signed)
I wrote her a DME order for a shower chair with handles that she could take to a medical supply store. I did not see a faxed form nor did she mention an order for signing during the apt.

## 2022-04-20 NOTE — Telephone Encounter (Signed)
Spoke with patient daughter Charlton Amor to notify her that the DME order that was provided to her at the patient's appointment today was to be taken to a local medical supply store. Charlton Amor verbalized she is currently at the medical supply store to drop off the DME order. Advised to give our office a call back if she or the patient has any questions or concerns.

## 2022-04-24 ENCOUNTER — Ambulatory Visit: Payer: Self-pay | Admitting: *Deleted

## 2022-04-24 ENCOUNTER — Ambulatory Visit: Payer: BC Managed Care – PPO

## 2022-04-24 DIAGNOSIS — R269 Unspecified abnormalities of gait and mobility: Secondary | ICD-10-CM | POA: Diagnosis not present

## 2022-04-24 NOTE — Assessment & Plan Note (Signed)
Acute, new concern Patient was discharged from ED after fracture and has concerns for her ADLs since she is still awaiting surgery I have placed a home health referral for her as she reports she needs assistance with safely getting to restroom, using the toilet, getting into and out of her shower and pivoting  She is requesting home health as she typically stays alone and her family are not able to stay with her during the day. She will also likely need PT after surgery but will defer to Podiatry/ Ortho for recommendations for this I have placed an order for a shower chair to aid with bathing given her limited mobility and need for assistance with getting into the shower/tub Recommend regular follow up after surgery to assist with home care needs and monitor recovery.

## 2022-04-24 NOTE — Patient Outreach (Signed)
  Care Coordination   Follow Up Visit Note   04/24/2022 Name: [name] MRN: [id number] DOB: [birth year]  Lisa Galvan is a 62 y.o. year old female who sees [name], DO for primary care. I spoke with  [name] by phone today.  What matters to the patients health and wellness today?  She is still working on arrangements for surgery later this week.      Goals Addressed             This Visit's Progress    RNCM: Fall prevention and Safety   On track    Care Coordination Interventions: Provided written and verbal education re: potential causes of falls and Fall prevention strategies.  Using DME to decrease risk of falling Reviewed medications and discussed potential side effects of medications such as dizziness and frequent urination Advised patient of importance of notifying provider of falls Assessed for falls since last encounter. Last fall ended in broken ankle Assessed patients knowledge of fall risk prevention secondary to previously provided education. Review with patient to monitor for risk factors and fall risk.  Provided patient information for fall alert systems. Review and patient has a life alert system. She has not set it up yet as she just received. Encouraged her to set up the system, especially since she lives alone. Assessed working status of life alert bracelet and patient adherence Advised patient to discuss fall prevention and safety concerns with provider Patient interviewed about adult health maintenance status including  Falls risk assessment    Fell on 12/23, fractured ankle, will require surgery on 1/5.  She is concerned about ability to get to [facility] to have surgery.  She has call out to surgeons office to request procedure be moved to [place] instead.  She will have daughter accompany her, but daughter will not be able to drive.  Will take taxi or uber Notified PCP of need for HHPT order as this was not done prior to discharge, however patient  not able to pay copay of $150 Collaborated with TOC nurse, discussed alternative ways to manage without bearing weight.   Discussed asking for assistance from church family         SDOH assessments and interventions completed:  No     Care Coordination Interventions:  Yes, provided   Follow up plan: Follow up call scheduled for pending disposition from hospital post surgery    Encounter Outcome:  Pt. Visit Completed   Valente David, RN, MSN, [facility] Care Management Coordinator [phone]

## 2022-04-24 NOTE — Patient Outreach (Signed)
  Care Coordination   04/24/2022 Name: Lisa Galvan MRN: [id number] DOB: 10-06-1960   Care Coordination Outreach Attempts:  An unsuccessful telephone outreach was attempted for a scheduled appointment today.  Follow Up Plan:  Additional outreach attempts will be made to offer the patient care coordination information and services.   Encounter Outcome:  No Answer   Care Coordination Interventions:  No, not indicated    Valente David, RN, MSN, [facility] Care Management Care Management Coordinator [phone]

## 2022-04-25 DIAGNOSIS — M79671 Pain in right foot: Secondary | ICD-10-CM | POA: Diagnosis not present

## 2022-04-25 DIAGNOSIS — S82851A Displaced trimalleolar fracture of right lower leg, initial encounter for closed fracture: Secondary | ICD-10-CM | POA: Diagnosis not present

## 2022-04-25 DIAGNOSIS — E1142 Type 2 diabetes mellitus with diabetic polyneuropathy: Secondary | ICD-10-CM | POA: Diagnosis not present

## 2022-04-25 DIAGNOSIS — M25571 Pain in right ankle and joints of right foot: Secondary | ICD-10-CM | POA: Diagnosis not present

## 2022-04-26 ENCOUNTER — Other Ambulatory Visit: Payer: Self-pay | Admitting: Podiatry

## 2022-04-26 ENCOUNTER — Telehealth: Payer: Self-pay

## 2022-04-26 NOTE — Telephone Encounter (Signed)
Lisa Galvan called to cancel her surgery with Dr. Sherryle Lis on 04/27/2022. She stated she will be having Dr. Luana Shu do her surgery at [facility] because she can't get transportation to [facility]. Notified Dr. Sherryle Lis, Dr. Milinda Pointer and Caren Griffins with [facility].

## 2022-04-27 ENCOUNTER — Encounter
Admission: RE | Admit: 2022-04-27 | Discharge: 2022-04-27 | Disposition: A | Discharge status: Home / Self Care | Payer: BC Managed Care – PPO | Source: Ambulatory Visit | Attending: Podiatry | Admitting: Podiatry

## 2022-04-27 ENCOUNTER — Ambulatory Visit (INDEPENDENT_AMBULATORY_CARE_PROVIDER_SITE_OTHER): Payer: BC Managed Care – PPO | Admitting: Physician Assistant

## 2022-04-27 ENCOUNTER — Encounter: Payer: Self-pay | Admitting: Physician Assistant

## 2022-04-27 VITALS — Ht 63.0 in | Wt 151.0 lb

## 2022-04-27 DIAGNOSIS — Z01812 Encounter for preprocedural laboratory examination: Secondary | ICD-10-CM

## 2022-04-27 DIAGNOSIS — Z538 Procedure and treatment not carried out for other reasons: Secondary | ICD-10-CM

## 2022-04-27 HISTORY — DX: Other intervertebral disc degeneration, lumbar region without mention of lumbar back pain or lower extremity pain: M51.369

## 2022-04-27 HISTORY — DX: Other intervertebral disc degeneration, lumbar region: M51.36
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
## 2022-04-27 NOTE — Pre-Procedure Instructions (Signed)
Copy and pasted ED EKG report from 12/11/2021:  ED ECG REPORT I, Blake Divine, the attending physician, personally viewed and interpreted this ECG.    Date: 12/11/2021  EKG Time: 9:24  Rate: 105  Rhythm: sinus tachycardia  Axis: Normal  Intervals:none  ST&T Change: None

## 2022-04-27 NOTE — Patient Instructions (Addendum)
Your procedure is scheduled on: Thursday, January 11 Report to the Registration Desk on the 1st floor of the [facility] To find out your arrival time, please call [phone] between 1PM - 3PM on: Wednesday, January 10 If your arrival time is 6:00 am, do not arrive prior to that time as the [facility] entrance doors do not open until 6:00 am.  REMEMBER: Instructions that are not followed completely may result in serious medical risk, up to and including death; or upon the discretion of your surgeon and anesthesiologist your surgery may need to be rescheduled.  Do not eat food after midnight the night before surgery.  No gum chewing, lozengers or hard candies.  You may however, drink water up to 2 hours before you are scheduled to arrive for your surgery. Do not drink anything within 2 hours of your scheduled arrival time.  In addition, your doctor has ordered for you to drink the provided  Gatorade G2 Drinking this carbohydrate drink up to two hours before surgery helps to reduce insulin resistance and improve patient outcomes. Please complete drinking 2 hours prior to scheduled arrival time.  TAKE THESE MEDICATIONS THE MORNING OF SURGERY WITH A SIP OF WATER:  Atorvastatin Duloxetine Gabapentin Metoprolol Omeprazole - (take one the night before and one on the morning of surgery - helps to prevent nausea after surgery.)  Metformin - hold 2 days before surgery. Last day to take is Monday, January 8. Resume AFTER surgery.  According to Dr. Excell Seltzer; hold aspirin for 5 days before surgery. Continue taking the lovenox injections until your last dose on Tuesday, January 9.  One week prior to surgery: starting today, January 5 Stop Anti-inflammatories (NSAIDS) such as Advil, Aleve, Ibuprofen, Motrin, Naproxen, Naprosyn and Aspirin based products such as Excedrin, Goodys Powder, BC Powder. Stop ANY OVER THE COUNTER supplements until after surgery.  You may however, continue to take  Tylenol if needed for pain up until the day of surgery.  No Alcohol for 24 hours before or after surgery.  No Smoking including e-cigarettes for 24 hours prior to surgery.  No chewable tobacco products for at least 6 hours prior to surgery.  No nicotine patches on the day of surgery.  Do not use any "recreational" drugs for at least a week prior to your surgery.  Please be advised that the combination of cocaine and anesthesia may have negative outcomes, up to and including death. If you test positive for cocaine, your surgery will be cancelled.  On the morning of surgery brush your teeth with toothpaste and water, you may rinse your mouth with mouthwash if you wish. Do not swallow any toothpaste or mouthwash.  Use CHG Soap or wipes as directed on instruction sheet.  Do not wear jewelry, make-up, hairpins, clips or nail polish.  Do not wear lotions, powders, or perfumes.   Do not shave body from the neck down 48 hours prior to surgery just in case you cut yourself which could leave a site for infection.  Also, freshly shaved skin may become irritated if using the CHG soap.  Contact lenses, hearing aids and dentures may not be worn into surgery.  Do not bring valuables to the hospital. [facility] is not responsible for any missing/lost belongings or valuables.   Bring your C-PAP to the hospital with you in case you may have to spend the night.   Notify your doctor if there is any change in your medical condition (cold, fever, infection).  Wear comfortable  clothing (specific to your surgery type) to the hospital.  After surgery, you can help prevent lung complications by doing breathing exercises.  Take deep breaths and cough every 1-2 hours. Your doctor may order a device called an Incentive Spirometer to help you take deep breaths.  If you are being admitted to the hospital overnight, leave your suitcase in the car. After surgery it may be brought to your room.  If you are  being discharged the day of surgery, you will not be allowed to drive home. You will need a responsible adult (18 years or older) to drive you home and stay with you that night.   If you are taking public transportation, you will need to have a responsible adult (18 years or older) with you. Please confirm with your physician that it is acceptable to use public transportation.   Please call the [place] [facility] at [phone] if you have any questions about these instructions.  Surgery Visitation Policy:  Patients undergoing a surgery or procedure may have two family members or support persons with them as long as the person is not COVID-19 positive or experiencing its symptoms.   Inpatient Visitation:    Visiting hours are 7 a.m. to 8 p.m. Up to four visitors are allowed at one time in a patient room. The visitors may rotate out with other people during the day. One designated support person (adult) may remain overnight.  Due to an increase in RSV and influenza rates and associated hospitalizations, children ages 64 and under will not be able to visit patients in [facility]. Masks continue to be strongly recommended.     Preparing for Surgery with CHLORHEXIDINE GLUCONATE (CHG) Soap  Chlorhexidine Gluconate (CHG) Soap  o An antiseptic cleaner that kills germs and bonds with the skin to continue killing germs even after washing  o Used for showering the night before surgery and morning of surgery  Before surgery, you can play an important role by reducing the number of germs on your skin.  CHG (Chlorhexidine gluconate) soap is an antiseptic cleanser which kills germs and bonds with the skin to continue killing germs even after washing.  Please do not use if you have an allergy to CHG or antibacterial soaps. If your skin becomes reddened/irritated stop using the CHG.  1. Shower the NIGHT BEFORE SURGERY and the MORNING OF SURGERY with CHG soap.  2. If you  choose to wash your hair, wash your hair first as usual with your normal shampoo.  3. After shampooing, rinse your hair and body thoroughly to remove the shampoo.  4. Use CHG as you would any other liquid soap. You can apply CHG directly to the skin and wash gently with a scrungie or a clean washcloth.  5. Apply the CHG soap to your body only from the neck down. Do not use on open wounds or open sores. Avoid contact with your eyes, ears, mouth, and genitals (private parts). Wash face and genitals (private parts) with your normal soap.  6. Wash thoroughly, paying special attention to the area where your surgery will be performed.  7. Thoroughly rinse your body with warm water.  8. Do not shower/wash with your normal soap after using and rinsing off the CHG soap.  9. Pat yourself dry with a clean towel.  10. Wear clean pajamas to bed the night before surgery.  12. Place clean sheets on your bed the night of your first shower and do not sleep with  pets.  93. Shower again with the CHG soap on the day of surgery prior to arriving at the hospital.  14. Do not apply any deodorants/lotions/powders.  15. Please wear clean clothes to the hospital.  Preparing the Skin Before Surgery     To help prevent the risk of infection at your surgical site, we are now providing you with rinse-free Sage 2% Chlorhexidine Gluconate (CHG) disposable wipes.  Chlorhexidine Gluconate (CHG) Soap  o An antiseptic cleaner that kills germs and bonds with the skin to continue killing germs even after washing  o Used for showering the night before surgery and morning of surgery  The night before surgery: Shower or bathe with warm water. Do not apply perfume, lotions, powders. Wait one hour after shower. Skin should be dry and cool. Open Sage wipe package - 6 disposable cloths are inside. Wipe body using one cloth for the right arm, one cloth for the left arm, one cloth for the right leg, one cloth for the  left leg, one cloth for the chest/abdomen area (do not use on breasts if breast feeding), and one cloth for the back. 5. Do not rinse, allow to dry. 6. Skin may fee "tacky" for several minutes. 7. Dress in clean clothes. 8. Place clean sheets on your bed and do not sleep with pets.  REPEAT ABOVE ON THE MORNING OF SURGERY PRIOR TO ARRIVING TO [place].

## 2022-04-27 NOTE — Pre-Procedure Instructions (Signed)
Last dose of aspirin on 04/27/22, continue the lovenox injections until your last dose on 05/01/22.

## 2022-05-02 ENCOUNTER — Encounter: Payer: BC Managed Care – PPO | Admitting: Podiatry

## 2022-05-03 ENCOUNTER — Other Ambulatory Visit: Payer: Self-pay

## 2022-05-03 ENCOUNTER — Encounter
Admission: RE | Disposition: A | Discharge status: Skilled Nursing Facility | Payer: Self-pay | Source: Ambulatory Visit | Attending: Internal Medicine

## 2022-05-03 ENCOUNTER — Ambulatory Visit: Payer: BC Managed Care – PPO

## 2022-05-03 ENCOUNTER — Ambulatory Visit: Payer: BC Managed Care – PPO | Admitting: Anesthesiology

## 2022-05-03 ENCOUNTER — Encounter: Payer: Self-pay | Admitting: Podiatry

## 2022-05-03 ENCOUNTER — Inpatient Hospital Stay: Payer: BC Managed Care – PPO

## 2022-05-03 ENCOUNTER — Observation Stay
Admission: RE | Admit: 2022-05-03 | Discharge: 2022-05-12 | Disposition: A | Discharge status: Skilled Nursing Facility | Payer: BC Managed Care – PPO | Source: Ambulatory Visit | Attending: Internal Medicine | Admitting: Internal Medicine

## 2022-05-03 DIAGNOSIS — Z79899 Other long term (current) drug therapy: Secondary | ICD-10-CM | POA: Insufficient documentation

## 2022-05-03 DIAGNOSIS — I10 Essential (primary) hypertension: Secondary | ICD-10-CM | POA: Diagnosis present

## 2022-05-03 DIAGNOSIS — E1142 Type 2 diabetes mellitus with diabetic polyneuropathy: Secondary | ICD-10-CM | POA: Diagnosis not present

## 2022-05-03 DIAGNOSIS — S82891A Other fracture of right lower leg, initial encounter for closed fracture: Principal | ICD-10-CM | POA: Diagnosis present

## 2022-05-03 DIAGNOSIS — Z7982 Long term (current) use of aspirin: Secondary | ICD-10-CM | POA: Diagnosis not present

## 2022-05-03 DIAGNOSIS — X58XXXA Exposure to other specified factors, initial encounter: Secondary | ICD-10-CM | POA: Diagnosis not present

## 2022-05-03 DIAGNOSIS — E876 Hypokalemia: Secondary | ICD-10-CM | POA: Diagnosis present

## 2022-05-03 DIAGNOSIS — G4733 Obstructive sleep apnea (adult) (pediatric): Secondary | ICD-10-CM | POA: Diagnosis not present

## 2022-05-03 DIAGNOSIS — E1140 Type 2 diabetes mellitus with diabetic neuropathy, unspecified: Secondary | ICD-10-CM | POA: Diagnosis not present

## 2022-05-03 DIAGNOSIS — S82851A Displaced trimalleolar fracture of right lower leg, initial encounter for closed fracture: Principal | ICD-10-CM | POA: Diagnosis present

## 2022-05-03 DIAGNOSIS — Z7984 Long term (current) use of oral hypoglycemic drugs: Secondary | ICD-10-CM | POA: Insufficient documentation

## 2022-05-03 DIAGNOSIS — F32A Depression, unspecified: Secondary | ICD-10-CM | POA: Diagnosis present

## 2022-05-03 DIAGNOSIS — S82431A Displaced oblique fracture of shaft of right fibula, initial encounter for closed fracture: Secondary | ICD-10-CM | POA: Diagnosis not present

## 2022-05-03 DIAGNOSIS — E785 Hyperlipidemia, unspecified: Secondary | ICD-10-CM | POA: Diagnosis present

## 2022-05-03 DIAGNOSIS — Z4789 Encounter for other orthopedic aftercare: Secondary | ICD-10-CM | POA: Diagnosis not present

## 2022-05-03 DIAGNOSIS — Z9889 Other specified postprocedural states: Secondary | ICD-10-CM | POA: Diagnosis not present

## 2022-05-03 DIAGNOSIS — Z01812 Encounter for preprocedural laboratory examination: Secondary | ICD-10-CM

## 2022-05-03 DIAGNOSIS — G8918 Other acute postprocedural pain: Secondary | ICD-10-CM | POA: Diagnosis not present

## 2022-05-03 HISTORY — PX: ORIF ANKLE FRACTURE: SHX5408

## 2022-05-03 LAB — POCT I-STAT, CHEM 8
BUN: <3 mg/dL — ABNORMAL LOW (ref 8–23)
Calcium, Ion: 1.17 mmol/L (ref 1.15–1.40)
Chloride: 98 mmol/L (ref 98–111)
Creatinine, Ser: 0.6 mg/dL (ref 0.44–1.00)
Glucose, Bld: 89 mg/dL (ref 70–99)
HCT: 36 % (ref 36.0–46.0)
Hemoglobin: 12.2 g/dL (ref 12.0–15.0)
Potassium: 3.4 mmol/L — ABNORMAL LOW (ref 3.5–5.1)
Sodium: 137 mmol/L (ref 135–145)
TCO2: 28 mmol/L (ref 22–32)

## 2022-05-03 LAB — GLUCOSE, CAPILLARY
Glucose-Capillary: 91 mg/dL (ref 70–99)
Glucose-Capillary: 125 mg/dL — ABNORMAL HIGH (ref 70–99)
Glucose-Capillary: 219 mg/dL — ABNORMAL HIGH (ref 70–99)

## 2022-05-03 LAB — MAGNESIUM: Magnesium: 1.3 mg/dL — ABNORMAL LOW (ref 1.7–2.4)

## 2022-05-03 SURGERY — OPEN REDUCTION INTERNAL FIXATION (ORIF) ANKLE FRACTURE
Anesthesia: Regional | Site: Ankle | Laterality: Right

## 2022-05-03 MED ORDER — SUGAMMADEX SODIUM 200 MG/2ML IV SOLN
INTRAVENOUS | Status: DC | PRN
  Administered 2022-05-03: 200 mg via INTRAVENOUS

## 2022-05-03 MED ORDER — PHENYLEPHRINE HCL-NACL 20-0.9 MG/250ML-% IV SOLN
INTRAVENOUS | Status: DC | PRN
  Administered 2022-05-03: 80 ug via INTRAVENOUS
  Administered 2022-05-03: 10 ug/min via INTRAVENOUS

## 2022-05-03 MED ORDER — PROPOFOL 10 MG/ML IV BOLUS
INTRAVENOUS | Status: DC | PRN
  Administered 2022-05-03: 100 mg via INTRAVENOUS
  Administered 2022-05-03: 20 mg via INTRAVENOUS
  Administered 2022-05-03: 30 mg via INTRAVENOUS

## 2022-05-03 MED ORDER — MIDAZOLAM HCL 2 MG/2ML IJ SOLN: 1.0000 mg | Freq: Once | INTRAMUSCULAR | Status: AC

## 2022-05-03 MED ORDER — BUPIVACAINE HCL (PF) 0.5 % IJ SOLN
INTRAMUSCULAR | Status: AC
  Filled 2022-05-03: qty 30

## 2022-05-03 MED ORDER — MIDAZOLAM HCL 2 MG/2ML IJ SOLN
INTRAMUSCULAR | Status: AC
  Filled 2022-05-03: qty 2

## 2022-05-03 MED ORDER — CEFAZOLIN SODIUM-DEXTROSE 2-4 GM/100ML-% IV SOLN
INTRAVENOUS | Status: AC
  Filled 2022-05-03: qty 100

## 2022-05-03 MED ORDER — INSULIN ASPART 100 UNIT/ML IJ SOLN
0.0000 [IU] | Freq: Three times a day (TID) | INTRAMUSCULAR | Status: DC
  Administered 2022-05-03 – 2022-05-04 (×2): 1 [IU] via SUBCUTANEOUS
  Administered 2022-05-04: 3 [IU] via SUBCUTANEOUS
  Administered 2022-05-04 – 2022-05-06 (×2): 1 [IU] via SUBCUTANEOUS
  Administered 2022-05-07: 2 [IU] via SUBCUTANEOUS
  Administered 2022-05-07: 1 [IU] via SUBCUTANEOUS
  Administered 2022-05-08: 3 [IU] via SUBCUTANEOUS
  Administered 2022-05-08: 1 [IU] via SUBCUTANEOUS
  Filled 2022-05-03 (×9): qty 1

## 2022-05-03 MED ORDER — 0.9 % SODIUM CHLORIDE (POUR BTL) OPTIME
TOPICAL | Status: DC | PRN
  Administered 2022-05-03: 1000 mL

## 2022-05-03 MED ORDER — BUPIVACAINE HCL (PF) 0.5 % IJ SOLN
INTRAMUSCULAR | Status: DC | PRN
  Administered 2022-05-03: 30 mL

## 2022-05-03 MED ORDER — ONDANSETRON HCL 4 MG/2ML IJ SOLN
INTRAMUSCULAR | Status: DC | PRN
  Administered 2022-05-03: 4 mg via INTRAVENOUS

## 2022-05-03 MED ORDER — ROCURONIUM BROMIDE 100 MG/10ML IV SOLN
INTRAVENOUS | Status: DC | PRN
  Administered 2022-05-03: 20 mg via INTRAVENOUS
  Administered 2022-05-03: 50 mg via INTRAVENOUS

## 2022-05-03 MED ORDER — PROPOFOL 1000 MG/100ML IV EMUL
INTRAVENOUS | Status: AC
  Filled 2022-05-03: qty 100

## 2022-05-03 MED ORDER — POTASSIUM CHLORIDE CRYS ER 20 MEQ PO TBCR
40.0000 meq | EXTENDED_RELEASE_TABLET | Freq: Once | ORAL | Status: AC
  Administered 2022-05-03: 40 meq via ORAL
  Filled 2022-05-03 (×2): qty 2

## 2022-05-03 MED ORDER — POTASSIUM CHLORIDE CRYS ER 10 MEQ PO TBCR
10.0000 meq | EXTENDED_RELEASE_TABLET | Freq: Three times a day (TID) | ORAL | Status: DC
  Filled 2022-05-03: qty 1

## 2022-05-03 MED ORDER — KETAMINE HCL 50 MG/5ML IJ SOSY
PREFILLED_SYRINGE | INTRAMUSCULAR | Status: AC
  Filled 2022-05-03: qty 5

## 2022-05-03 MED ORDER — FENTANYL CITRATE (PF) 100 MCG/2ML IJ SOLN
INTRAMUSCULAR | Status: AC
  Filled 2022-05-03: qty 2

## 2022-05-03 MED ORDER — SODIUM CHLORIDE 0.9 % IV SOLN: INTRAVENOUS | Status: DC

## 2022-05-03 MED ORDER — FENTANYL CITRATE (PF) 100 MCG/2ML IJ SOLN
25.0000 ug | INTRAMUSCULAR | Status: DC | PRN
  Administered 2022-05-03: 25 ug via INTRAVENOUS

## 2022-05-03 MED ORDER — ACETAMINOPHEN 10 MG/ML IV SOLN
INTRAVENOUS | Status: DC | PRN
  Administered 2022-05-03: 1000 mg via INTRAVENOUS

## 2022-05-03 MED ORDER — FENTANYL CITRATE (PF) 100 MCG/2ML IJ SOLN
INTRAMUSCULAR | Status: AC
  Administered 2022-05-03: 25 ug via INTRAVENOUS
  Filled 2022-05-03: qty 2

## 2022-05-03 MED ORDER — BUPIVACAINE LIPOSOME 1.3 % IJ SUSP
INTRAMUSCULAR | Status: AC
  Filled 2022-05-03: qty 20

## 2022-05-03 MED ORDER — BUPIVACAINE LIPOSOME 1.3 % IJ SUSP
INTRAMUSCULAR | Status: DC | PRN
  Administered 2022-05-03: 20 mL

## 2022-05-03 MED ORDER — CHLORHEXIDINE GLUCONATE 0.12 % MT SOLN
OROMUCOSAL | Status: AC
  Administered 2022-05-03: 15 mL via OROMUCOSAL
  Filled 2022-05-03: qty 15

## 2022-05-03 MED ORDER — ONDANSETRON HCL 4 MG/2ML IJ SOLN: 4.0000 mg | Freq: Three times a day (TID) | INTRAMUSCULAR | Status: DC | PRN

## 2022-05-03 MED ORDER — FENTANYL CITRATE PF 50 MCG/ML IJ SOSY
PREFILLED_SYRINGE | INTRAMUSCULAR | Status: AC
  Administered 2022-05-03: 50 ug via INTRAVENOUS
  Filled 2022-05-03: qty 1

## 2022-05-03 MED ORDER — LIDOCAINE HCL (PF) 1 % IJ SOLN
INTRAMUSCULAR | Status: AC
  Filled 2022-05-03: qty 30

## 2022-05-03 MED ORDER — POTASSIUM CHLORIDE CRYS ER 20 MEQ PO TBCR
EXTENDED_RELEASE_TABLET | ORAL | Status: AC
  Administered 2022-05-03: 10 meq via ORAL
  Filled 2022-05-03: qty 1

## 2022-05-03 MED ORDER — CEFAZOLIN SODIUM-DEXTROSE 2-4 GM/100ML-% IV SOLN
2.0000 g | INTRAVENOUS | Status: AC
  Administered 2022-05-03: 2 g via INTRAVENOUS

## 2022-05-03 MED ORDER — ORAL CARE MOUTH RINSE: 15.0000 mL | Freq: Once | OROMUCOSAL | Status: AC

## 2022-05-03 MED ORDER — HYDRALAZINE HCL 20 MG/ML IJ SOLN: 5.0000 mg | INTRAMUSCULAR | Status: DC | PRN

## 2022-05-03 MED ORDER — DEXAMETHASONE SODIUM PHOSPHATE 10 MG/ML IJ SOLN
INTRAMUSCULAR | Status: DC | PRN
  Administered 2022-05-03: 10 mg via INTRAVENOUS

## 2022-05-03 MED ORDER — ACETAMINOPHEN 10 MG/ML IV SOLN
INTRAVENOUS | Status: AC
  Filled 2022-05-03: qty 100

## 2022-05-03 MED ORDER — ACETAMINOPHEN 10 MG/ML IV SOLN: 1000.0000 mg | Freq: Once | INTRAVENOUS | Status: DC | PRN

## 2022-05-03 MED ORDER — ONDANSETRON HCL 4 MG/2ML IJ SOLN: 4.0000 mg | Freq: Once | INTRAMUSCULAR | Status: DC | PRN

## 2022-05-03 MED ORDER — LACTATED RINGERS IV SOLN: INTRAVENOUS | Status: DC

## 2022-05-03 MED ORDER — OXYCODONE HCL 5 MG PO TABS
5.0000 mg | ORAL_TABLET | Freq: Once | ORAL | Status: DC | PRN
  Administered 2022-05-03: 5 mg via ORAL

## 2022-05-03 MED ORDER — SENNOSIDES-DOCUSATE SODIUM 8.6-50 MG PO TABS: 1.0000 | ORAL_TABLET | Freq: Every evening | ORAL | Status: DC | PRN

## 2022-05-03 MED ORDER — PROPOFOL 500 MG/50ML IV EMUL
INTRAVENOUS | Status: DC | PRN
  Administered 2022-05-03: 100 ug/kg/min via INTRAVENOUS

## 2022-05-03 MED ORDER — OXYCODONE HCL 5 MG PO TABS
ORAL_TABLET | ORAL | Status: AC
  Filled 2022-05-03: qty 1

## 2022-05-03 MED ORDER — CHLORHEXIDINE GLUCONATE 0.12 % MT SOLN: 15.0000 mL | Freq: Once | OROMUCOSAL | Status: AC

## 2022-05-03 MED ORDER — OXYCODONE-ACETAMINOPHEN 5-325 MG PO TABS
1.0000 | ORAL_TABLET | Freq: Four times a day (QID) | ORAL | Status: DC | PRN
  Administered 2022-05-03: 2 via ORAL
  Administered 2022-05-04: 1 via ORAL
  Administered 2022-05-04 – 2022-05-08 (×12): 2 via ORAL
  Administered 2022-05-08: 1 via ORAL
  Administered 2022-05-09 – 2022-05-10 (×4): 2 via ORAL
  Administered 2022-05-11 (×2): 1 via ORAL
  Administered 2022-05-12 (×2): 2 via ORAL
  Filled 2022-05-03 (×5): qty 2
  Filled 2022-05-03: qty 1
  Filled 2022-05-03 (×8): qty 2
  Filled 2022-05-03: qty 1
  Filled 2022-05-03 (×2): qty 2
  Filled 2022-05-03: qty 1
  Filled 2022-05-03 (×4): qty 2
  Filled 2022-05-03: qty 1
  Filled 2022-05-03 (×2): qty 2
  Filled 2022-05-03: qty 1

## 2022-05-03 MED ORDER — MORPHINE SULFATE (PF) 2 MG/ML IV SOLN
2.0000 mg | INTRAVENOUS | Status: DC | PRN
  Administered 2022-05-04 – 2022-05-05 (×2): 2 mg via INTRAVENOUS
  Filled 2022-05-03 (×2): qty 1

## 2022-05-03 MED ORDER — MIDAZOLAM HCL 2 MG/2ML IJ SOLN
INTRAMUSCULAR | Status: AC
  Administered 2022-05-03: 1 mg via INTRAVENOUS
  Filled 2022-05-03: qty 2

## 2022-05-03 MED ORDER — KETAMINE HCL 10 MG/ML IJ SOLN
INTRAMUSCULAR | Status: DC | PRN
  Administered 2022-05-03 (×2): 10 mg via INTRAVENOUS

## 2022-05-03 MED ORDER — OXYCODONE HCL 5 MG/5ML PO SOLN: 5.0000 mg | Freq: Once | ORAL | Status: DC | PRN

## 2022-05-03 MED ORDER — DEXMEDETOMIDINE HCL IN NACL 80 MCG/20ML IV SOLN
INTRAVENOUS | Status: DC | PRN
  Administered 2022-05-03 (×2): 4 ug via INTRAVENOUS

## 2022-05-03 MED ORDER — OXYCODONE-ACETAMINOPHEN 5-325 MG PO TABS: 1.0000 | ORAL_TABLET | ORAL | Status: DC | PRN

## 2022-05-03 MED ORDER — INSULIN ASPART 100 UNIT/ML IJ SOLN
0.0000 [IU] | Freq: Every day | INTRAMUSCULAR | Status: DC
  Administered 2022-05-03: 2 [IU] via SUBCUTANEOUS
  Filled 2022-05-03: qty 1

## 2022-05-03 MED ORDER — FENTANYL CITRATE (PF) 100 MCG/2ML IJ SOLN
INTRAMUSCULAR | Status: DC | PRN
  Administered 2022-05-03 (×2): 25 ug via INTRAVENOUS
  Administered 2022-05-03: 50 ug via INTRAVENOUS

## 2022-05-03 MED ORDER — FENTANYL CITRATE PF 50 MCG/ML IJ SOSY: 50.0000 ug | PREFILLED_SYRINGE | Freq: Once | INTRAMUSCULAR | Status: AC

## 2022-05-03 SURGICAL SUPPLY — 64 items
BIT DRILL 2.0X130 SLD AO (BIT) IMPLANT
BIT DRILL CANNULTD 2.6 X 130MM (DRILL) IMPLANT
BLADE SURG 15 STRL LF DISP TIS (BLADE) IMPLANT
BLADE SURG 15 STRL SS (BLADE) ×2
BNDG COHESIVE 4X5 TAN STRL LF (GAUZE/BANDAGES/DRESSINGS) ×1 IMPLANT
BNDG ELASTIC 4X5.8 VLCR NS LF (GAUZE/BANDAGES/DRESSINGS) ×1 IMPLANT
BNDG ELASTIC 4X5.8 VLCR STR LF (GAUZE/BANDAGES/DRESSINGS) IMPLANT
BNDG ELASTIC 6X5.8 VLCR NS LF (GAUZE/BANDAGES/DRESSINGS) IMPLANT
BNDG ESMARK 4X12 TAN STRL LF (GAUZE/BANDAGES/DRESSINGS) ×1 IMPLANT
BNDG GAUZE DERMACEA FLUFF 4 (GAUZE/BANDAGES/DRESSINGS) ×1 IMPLANT
BNDG STRETCH GAUZE 3IN X12FT (GAUZE/BANDAGES/DRESSINGS) ×1 IMPLANT
COUNTERSICK 4.0 HEADED (MISCELLANEOUS) ×1
DRAPE C-ARM XRAY 36X54 (DRAPES) ×1 IMPLANT
DRAPE C-ARMOR (DRAPES) ×1 IMPLANT
DRILL CANNULATED 2.6 X 130MM (DRILL) ×1
DURAPREP 26ML APPLICATOR (WOUND CARE) ×1 IMPLANT
ELECT REM PT RETURN 9FT ADLT (ELECTROSURGICAL) ×1
ELECTRODE REM PT RTRN 9FT ADLT (ELECTROSURGICAL) ×1 IMPLANT
GAUZE SPONGE 4X4 12PLY STRL (GAUZE/BANDAGES/DRESSINGS) ×1 IMPLANT
GAUZE STRETCH 2X75IN STRL (MISCELLANEOUS) ×1 IMPLANT
GAUZE XEROFORM 1X8 LF (GAUZE/BANDAGES/DRESSINGS) ×1 IMPLANT
GLOVE BIO SURGEON STRL SZ7 (GLOVE) ×1 IMPLANT
GLOVE SURG UNDER LTX SZ7 (GLOVE) ×1 IMPLANT
GOWN STRL REUS W/ TWL LRG LVL3 (GOWN DISPOSABLE) ×3 IMPLANT
GOWN STRL REUS W/TWL LRG LVL3 (GOWN DISPOSABLE) ×3
K-WIRE SNGL END 1.2X150 (MISCELLANEOUS) ×3
KIT TURNOVER KIT A (KITS) ×1 IMPLANT
KWIRE SNGL END 1.2X150 (MISCELLANEOUS) IMPLANT
LABEL OR SOLS (LABEL) ×1 IMPLANT
MANIFOLD NEPTUNE II (INSTRUMENTS) ×1 IMPLANT
NEEDLE HYPO 22GX1.5 SAFETY (NEEDLE) ×1 IMPLANT
NS IRRIG 500ML POUR BTL (IV SOLUTION) ×1 IMPLANT
PACK EXTREMITY ARMC (MISCELLANEOUS) ×1 IMPLANT
PAD CAST 4YDX4 CTTN HI CHSV (CAST SUPPLIES) IMPLANT
PAD PREP 24X41 OB/GYN DISP (PERSONAL CARE ITEMS) ×1 IMPLANT
PADDING CAST COTTON 4X4 STRL (CAST SUPPLIES) ×4
PLATE FIBULA 9HOLE ANATOMICAL (Plate) IMPLANT
PUTTY DBX 1CC (Putty) ×1 IMPLANT
PUTTY DBX 1CC DEPUY (Putty) IMPLANT
SCREW 4.0 X 40 LT (Screw) IMPLANT
SCREW COUNTERSINK 4.0 HEADED (MISCELLANEOUS) IMPLANT
SCREW LOCK PLATE R3 2.7X11 (Screw) IMPLANT
SCREW LOCK PLATE R3 2.7X12 (Screw) IMPLANT
SCREW LOCK PLATE R3 2.7X13 (Screw) IMPLANT
SCREW LOCK PLATE R3 2.7X15 (Screw) IMPLANT
SPLINT CAST 1 STEP 4X30 (MISCELLANEOUS) ×1 IMPLANT
SPLINT PLASTER CAST FAST 5X30 (CAST SUPPLIES) ×1 IMPLANT
SPONGE T-LAP 18X18 ~~LOC~~+RFID (SPONGE) ×1 IMPLANT
STAPLER SKIN PROX 35W (STAPLE) ×1 IMPLANT
STIMULATOR BONE (ORTHOPEDIC SUPPLIES) ×1
STIMULATOR BONE GROWTH EMG EXT (ORTHOPEDIC SUPPLIES) IMPLANT
STOCKINETTE M/LG 89821 (MISCELLANEOUS) ×1 IMPLANT
STRIP CLOSURE SKIN 1/2X4 (GAUZE/BANDAGES/DRESSINGS) IMPLANT
SUT ETHILON 3-0 FS-10 30 BLK (SUTURE) ×1
SUT VIC AB 2-0 CT1 27 (SUTURE) ×1
SUT VIC AB 2-0 CT1 TAPERPNT 27 (SUTURE) ×1 IMPLANT
SUT VIC AB 3-0 SH 27 (SUTURE) ×2
SUT VIC AB 3-0 SH 27X BRD (SUTURE) ×1 IMPLANT
SUTURE EHLN 3-0 FS-10 30 BLK (SUTURE) IMPLANT
SWABSTK COMLB BENZOIN TINCTURE (MISCELLANEOUS) IMPLANT
SYR 10ML LL (SYRINGE) ×1 IMPLANT
SYR 50ML LL SCALE MARK (SYRINGE) ×1 IMPLANT
TRAP FLUID SMOKE EVACUATOR (MISCELLANEOUS) ×1 IMPLANT
WIRE OLIVE SMOOTH 1.4MMX60MM (WIRE) IMPLANT

## 2022-05-03 NOTE — H&P (Addendum)
HISTORY AND PHYSICAL INTERVAL NOTE:  05/03/2022  12:34 PM  [name]  has presented today for surgery, with the diagnosis of S82.851A - Closed displaced trimalleolar fracture of right ankle M25.571 - Acute right ankle pain M79.671 - Right foot pain E11.42 - Type 2 diabetes mellitus with polyneuropathy.  The various methods of treatment have been discussed with the patient.  No guarantees were given.  After consideration of risks, benefits and other options for treatment, the patient has consented to surgery.  I have reviewed the patients' chart and labs.    CV: RRR, no obvious murmurs Resp: CTA, nonlabored  PROCEDURE: RIGHT TRIMALLEOLAR ANKLE FRACTURE ORIF  A history and physical examination was performed in my office.  The patient was reexamined.  There have been no changes to this history and physical examination.  Caroline More, DPM

## 2022-05-03 NOTE — Anesthesia Procedure Notes (Signed)
Anesthesia Regional Block: Popliteal block   Pre-Anesthetic Checklist: , timeout performed,  Correct Patient, Correct Site, Correct Laterality,  Correct Procedure,, risks and benefits discussed,  Surgical consent,  Pre-op evaluation,  At surgeon's request and post-op pain management  Laterality: Right  Prep: chloraprep       Needles:  Injection technique: Single-shot  Needle Type: Echogenic Needle          Additional Needles:   Procedures:,,,, ultrasound used (permanent image in chart),,   Motor weakness within 20 minutes.  Narrative:  Start time: 05/03/2022 12:41 PM End time: 05/03/2022 12:44 PM Injection made incrementally with aspirations every 5 mL.  Performed by: Personally  Anesthesiologist: Darrin Nipper, MD  Additional Notes: Functioning IV was confirmed and monitors applied.  Sterile prep and drape, hand hygiene and sterile gloves were used. Ultrasound guidance: relevant anatomy identified, needle position confirmed, local anesthetic spread visualized around nerve(s), vascular puncture avoided.  Image saved to electronic medical record.  Negative aspiration prior to incremental administration of local anesthetic for total 20 ml bupivacaine 0.5% given in popliteal sciatic distribution. The patient tolerated the procedure well. Vital signs and moderate sedation medications recorded in RN notes.

## 2022-05-03 NOTE — Op Note (Signed)
PODIATRY / FOOT AND ANKLE SURGERY OPERATIVE REPORT    SURGEON: Caroline More, DPM  PRE-OPERATIVE DIAGNOSIS:  1.  Right trimalleolar ankle fracture, closed, displaced  POST-OPERATIVE DIAGNOSIS: Same  PROCEDURE(S): Right trimalleolar ankle fracture open reduction with internal fixation  HEMOSTASIS: Right thigh tourniquet  ANESTHESIA: general  ESTIMATED BLOOD LOSS: 20 cc  FINDING(S): 1.  Comminuted distal fibula fracture right oblique in nature displaced laterally and proximally 2.  Medial malleolar displaced fracture right 3.  Small nondisplaced posterior malleolar fracture right 4.  Anterior ankle joint bone fragment right  PATHOLOGY/SPECIMEN(S): None  INDICATIONS:   Lisa Galvan is a 62 y.o. female who presents with a displaced fracture to the right ankle.  Patient had went to the emergency room around 2 weeks ago due to dizziness and had x-rays taken which showed a displaced trimalleolar ankle fracture right side.  Patient was admitted to the hospital and closed reduction was performed.  Patient was seen by Dr. Sherryle Lis and was taken back to surgery for open reduction with internal fixation but was noted to have large fracture blisters present.  Patient had closed reduction performed in the emergency room yet further in posterior splint and sugar-tong splint was applied.  Patient was supposed undergo surgical intervention with [facility] in [place] but she did not want to go to [place] [facility] out of the procedure done in [place] so she came to our office for further assessment.  All treatment options were discussed with the patient both conservative and surgical attempts at correction clean potential risks and complications at this time patient is elected for surgical procedure consisting of right trimalleolar ankle fracture open reduction with internal fixation.  Patient will be admitted for 23-hour observation with PT and OT referrals to be placed, if they deem it  necessary may potentially have to stay for prolonged longer for skilled nursing facility/rehab placement if needed.  No guarantees given.  Consent obtained prior to procedure..  DESCRIPTION: After obtaining full informed written consent, the patient was brought back to the operating room and placed supine upon the operating table.  The patient received IV antibiotics prior to induction.  After obtaining adequate anesthesia, the patient was prepped and draped in the standard fashion.  Anesthesia preoperatively performed popliteal/saphenous nerve block.  Esmarch bandage was used to exsanguinate the right lower extremity and the pneumatic thigh tourniquet was inflated.  Attention was directed to the lateral ankle where a linear longitudinal incision was made along the course of the lateral malleolus from the tip of the lateral malleolus to the distal shaft of the fibula.  The incision was deepened through the subcutaneous tissues utilizing sharp dissection and care was taken to identify and retract all vital neurovascular structures all venous contributories were cauterized as necessary.  The superficial peroneal nerve was identified and retracted anteriorly throughout the remainder the case.  At this time a periosteal incision was made into the lateral aspect of the fibula.  The periosteal tissue was reflected anteriorly and posteriorly thereby exposing the distal fibula and lateral malleolus.  The fracture was exposed at the operative site.  The fracture appeared to be oblique and comminuted as there appeared to be a piece of the anterior aspect of the distal/capital fragment displaced in the area where the syndesmosis was.  The fracture appeared to be displaced laterally and proximally.  There appeared to be a lot of interpositional tissue present within the fracture site.  The interpositional tissue was resected and passed off the operative  site.  Dissection was continued around the distal fibular fragment and  the fibular fragment was freed up from any soft tissue adhesions.  The fibular fragment was then manipulated into a reduced position.  It was quite difficult to get the reduction due to the subacute nature of the injury and soft tissue interposition.  Manipulation was continued and further dissection was continued into the fibular fracture site to free up any further tissues.  Finally after a while the distal fibular fragment was then keyed into the appropriate position and held into place with reduction clamps.  C-arm imaging was utilized to verify correct position of the fibula appeared to be excellent as the fibula appeared to be pulled out to length and rotated into the appropriate position.  Since the tissue at the anterior aspect of the fibula did appear to be very soft and the bone appeared to be soft in this area no lag screw was attempted and there appeared to be a comminuted piece in this area as well so the fracture would have been fairly unstable with the lag screw across this area.  Once the fibula was brought out to length in the appropriate position, the distal fibula locking plate from [employer] which was 9 hole was placed over the area and temporary fixated with 2 olive wires.  Fluoroscopic guidance was used to verify position of plate which appeared to be excellent overall.  At this time 3 locking screws were then placed into the distal fragment and 3 locking screws were placed proximal to the fracture line.  All screws were 2.7 mm locking screws.  The reduction clamp was removed and the fracture was assessed and noted to be very stable.  There appeared to be some bone missing from the area where the fracture fragment was at the syndesmosis level.  This area was packed with DBM.  The syndesmosis small fracture piece was then placed in the appropriate position and sutured and into place with 3-0 Vicryl.  C-arm imaging was utilized to verify correct position of plate and screws which appeared to  be excellent and the fibula appeared to be out to length and well reduced.  The medial malleolar fragment also appeared to be more reduced at this time since the fibula was reduced.  At this time attention was then directed to the medial ankle where a linear longitudinal incision was made following the path of the medial malleolus.  This incision was made parallel to the great saphenous vein.  The incision was deepened through the subcutaneous tissues utilizing sharp and blunt dissection, all venous contributories were cauterized as necessary.  At this time the medial malleolar fracture fragment was able to be visualized.  A periosteal incision was made over the area and the periosteum was reflected from the fracture site.  There appeared to be a lot of interpositional tissue growth/periosteum within the fracture itself.  This periosteum and tissue was resected and passed off the operative site.  The fracture was then distracted and the ankle joint was inspected.  Any loose debris was removed and synovitic tissue.  The anterior ankle joint loose piece was also visualized through this area.  The small piece was removed through the same incision and passed off the operative site.  The piece appear to be very small and was approximately 1 cm x 0.5 cm and did have a small piece of cartilage to the area at the anterior aspect of the front of the tibia.  The surgical site  was flushed with copious amounts normal sterile saline.  At this time the medial malleolar fracture fragment was moved into the appropriate position and held intact with a reduction clamp.  Visualization of fracture was noted clinically to be in the appropriate position radiographically as well using C arm.  2 guidewires for 4.0 mm partially-threaded headed cannulated screws were then placed parallel to 1 another through the medial malleolar fracture fragment across the fracture and into the distal tibia with the appropriate orientation.  This was once  again visualized under fluoroscopic guidance.  Utilizing standard AO principles and techniques to 4.0 x 40 mm partially-threaded headed cannulated screws were placed across the medial malleolar fracture fragment with excellent compression noted.  The medial malleolar fracture fragment appeared to be very stable at this time.  The ankle joint was then brought through range of motion noted to have excellent stability overall.  The cotton hook test was then performed through the fibular area and there did not appear to be any instability to the syndesmosis.  Varus and valgus stress testing was also performed which did not reveal any displacement.  The surgical sites were flushed with copious amounts normal sterile saline.  The periosteal and capsular structures then reapproximated well coapted at the procedure sites with 3-0 Vicryl.  The subcutaneous tissue was reapproximated well coapted with 3-0 Vicryl and the skin was then reapproximated well coapted with skin staples.  The pneumatic thigh tourniquet was deflated during this process and a prompt hyperemic response was noted all digits of the right foot and ankle.  Applied Xeroform to the incision sites followed by 4 x 4 gauze, gauze roll, Webril, posterior splint, Ace wrap.  Patient tolerated the procedure and anesthesia well was transferred to recovery room vital signs stable vascular status intact all toes the right foot.  Following a period of postoperative monitoring the patient will be admitted for at least observation with assessment from PT and OT to see if skilled nursing facility/rehab is needed.  Patient is to remain nonweightbearing at all times to the right lower extremity.  Patient is to start aspirin 81 mg twice daily starting tomorrow.  Will also start 7-day course of Augmentin tomorrow.  Pain medication orders placed.  Will follow back up with patient tomorrow for further assessment and potential discharge versus further admission if SNF/rehab is  needed.  COMPLICATIONS: None  CONDITION: Good, stable  Rosetta Posner, DPM

## 2022-05-03 NOTE — Anesthesia Procedure Notes (Signed)
Anesthesia Regional Block: Adductor canal block   Pre-Anesthetic Checklist: , timeout performed,  Correct Patient, Correct Site, Correct Laterality,  Correct Procedure,, risks and benefits discussed,  Surgical consent,  Pre-op evaluation,  At surgeon's request and post-op pain management  Laterality: Right  Prep: chloraprep       Needles:  Injection technique: Single-shot  Needle Type: Echogenic Needle          Additional Needles:   Procedures:,,,, ultrasound used (permanent image in chart),,   Motor weakness within 20 minutes.  Narrative:  Start time: 05/03/2022 12:44 PM End time: 05/03/2022 12:45 PM Injection made incrementally with aspirations every 5 mL.  Performed by: Personally  Anesthesiologist: Darrin Nipper, MD  Additional Notes: Functioning IV was confirmed and monitors applied.  Sterile prep and drape, hand hygiene and sterile gloves were used. Ultrasound guidance: relevant anatomy identified, needle position confirmed, local anesthetic spread visualized around nerve(s), vascular puncture avoided.  Image saved to electronic medical record.  Negative aspiration prior to incremental administration of local anesthetic for total 10 ml bupivacaine 0.5% given in adductor saphenous distribution. The patient tolerated the procedure well. Vital signs and moderate sedation medications recorded in RN notes.

## 2022-05-03 NOTE — Transfer of Care (Signed)
Immediate Anesthesia Transfer of Care Note  Patient: Lisa Galvan  Procedure(s) Performed: OPEN REDUCTION INTERNAL FIXATION (ORIF) ANKLE FRACTURE - TRIMALLEOLAR WITH INTERNAL FIXATION (Right: Ankle)  Patient Location: PACU  Anesthesia Type:General  Level of Consciousness: awake, alert , and oriented  Airway & Oxygen Therapy: Patient Spontanous Breathing and Patient connected to face mask oxygen  Post-op Assessment: Report given to RN and Post -op Vital signs reviewed and stable  Post vital signs: Reviewed and stable  Last Vitals:  Vitals Value Taken Time  BP 136/94 05/03/22 1610  Temp    Pulse 79 05/03/22 1613  Resp 18 05/03/22 1613  SpO2 100 % 05/03/22 1613  Vitals shown include unvalidated device data.  Last Pain:  Vitals:   05/03/22 1207  TempSrc: Temporal  PainSc: 3          Complications: No notable events documented.

## 2022-05-03 NOTE — H&P (Signed)
History and Physical    Lisa Galvan OEV:[id number] DOB: 1960/12/16 DOA: 05/03/2022  Referring MD/NP/PA:   PCP: Dorcas Carrow, DO   Patient coming from:  The patient is coming from home.   Chief Complaint: right ankle pain  HPI: Lisa Galvan is a 62 y.o. female with medical history significant of hypertension, hyperlipidemia, diabetes mellitus, GERD, depression, chronic pain syndrome, OSA not on CPAP, who presents with right ankle pain.  Patient states that she injured her right ankle 2 weeks ago, and was found to have closed displaced trimalleolar fracture of right ankle. Pt is scheduled for surgery by Dr. Excell Seltzer of podiatry today. Pt underwent rIGHT TRIMALLEOLAR ANKLE FRACTURE ORIF by Dr. Excell Seltzer. When I saw pt in PCAU, she is drowsy, but oriented x 3.  She has some mild pain in the surgical site.  Denies chest pain, cough, shortness breath.  No nausea vomiting, diarrhea or abdominal pain.  No symptoms of UTI.  Data reviewed independently and ED Course: pt was found to have hemoglobin 12.2, renal function okay, potassium 3.4, temperature normal, blood pressure 170/64, heart rate 75, oxygen saturation 98% on room air.  Patient is placed on MedSurg bed for oxygen   EKG:   Not done yet, will get one.     Review of Systems:   General: no fevers, chills, no body weight gain, fatigue HEENT: no blurry vision, hearing changes or sore throat Respiratory: no dyspnea, coughing, wheezing CV: no chest pain, no palpitations GI: no nausea, vomiting, abdominal pain, diarrhea, constipation GU: no dysuria, burning on urination, increased urinary frequency, hematuria  Ext: no leg edema Neuro: no unilateral weakness, numbness, or tingling, no vision change or hearing loss Skin: no rash, no skin tear. MSK: s/p of right ankle surgery, has pain in right ankle surgical site Heme: No easy bruising.  Travel history: No recent long distant travel.   Allergy:  Allergies  Allergen Reactions   Benazepril  Swelling    Face and mouth swelling.   Ivp Dye [Iodinated Contrast Media] Anaphylaxis    Patient unsure of reaction but in case of reaction d/t shellfish allergy   Lidocaine Rash    02-01-2021: Patient had lidocaine patch applied to chest at sight of injury and she experienced a rash, whelping, and swelling. Resolved after removing the patch   Lyrica [Pregabalin] Swelling    Face/ mouth swelled up balloon   Shellfish Allergy Anaphylaxis and Swelling    Betadine on skin is okay   Shellfish-Derived Products Anaphylaxis   Trulicity [Dulaglutide] Swelling    Face and mouth swelled up   Ace Inhibitors Swelling    Past Medical History:  Diagnosis Date   Allergy    Anemia    Arthritis    spine   Bilateral leg edema    Chest pain 12/15/2014   Chronic sciatica 12/10/2014   DDD (degenerative disc disease), lumbar    Essential hypertension 12/10/2014   Gastroesophageal reflux disease without esophagitis    Mixed hyperlipidemia    Neuropathy    Obesity (BMI 30.0-34.9) 12/10/2014   Pneumonia 1968   Sleep apnea    waiting for new cpap   Type 2 diabetes mellitus without complication (HCC) 12/10/2014   Umbilical hernia     Past Surgical History:  Procedure Laterality Date   ABDOMINAL HYSTERECTOMY     ANKLE CLOSED REDUCTION Right 04/15/2022   Procedure: CLOSED REDUCTION ANKLE;  Surgeon: Edwin Cap, DPM;  Location: [facility] ORS;  Service: Podiatry;  Laterality: Right;  COLONOSCOPY WITH PROPOFOL N/A 03/30/2019   Procedure: COLONOSCOPY WITH PROPOFOL;  Surgeon: Toney Reil, MD;  Location: [facility] ENDOSCOPY;  Service: Gastroenterology;  Laterality: N/A;   ESOPHAGOGASTRODUODENOSCOPY (EGD) WITH PROPOFOL N/A 03/12/2017   Procedure: ESOPHAGOGASTRODUODENOSCOPY (EGD) WITH PROPOFOL;  Surgeon: Toney Reil, MD;  Location: [facility] ENDOSCOPY;  Service: Gastroenterology;  Laterality: N/A;   ESOPHAGOGASTRODUODENOSCOPY (EGD) WITH PROPOFOL N/A 03/30/2019   Procedure:  ESOPHAGOGASTRODUODENOSCOPY (EGD) WITH PROPOFOL;  Surgeon: Toney Reil, MD;  Location: [facility] ENDOSCOPY;  Service: Gastroenterology;  Laterality: N/A;   HERNIA REPAIR     INCISIONAL HERNIA REPAIR N/A 11/29/2020   Procedure: HERNIA REPAIR INCISIONAL, open;  Surgeon: Leafy Ro, MD;  Location: [facility] ORS;  Service: General;  Laterality: N/A;   ORIF ANKLE FRACTURE Left 07/30/2019   Procedure: OPEN REDUCTION INTERNAL FIXATION (ORIF) OF LEFT BIMALLEOLAR ANKLE FRACTURE;  Surgeon: Signa Kell, MD;  Location: [facility];  Service: Orthopedics;  Laterality: Left;  Diabetic - oral meds   SUPRACERVICAL ABDOMINAL HYSTERECTOMY     UMBILICAL HERNIA REPAIR N/A 05/01/2017   Procedure: HERNIA REPAIR UMBILICAL ADULT;  Surgeon: Ancil Linsey, MD;  Location: [facility] ORS;  Service: General;  Laterality: N/A;   XI ROBOTIC ASSISTED VENTRAL HERNIA N/A 10/06/2019   Procedure: XI ROBOTIC ASSISTED VENTRAL HERNIA;  Surgeon: Leafy Ro, MD;  Location: [facility] ORS;  Service: General;  Laterality: N/A;    Social History:  reports that she has never smoked. She has never used smokeless tobacco. She reports current alcohol use of about 3.0 standard drinks of alcohol per week. She reports that she does not use drugs.  Family History:  Family History  Problem Relation Age of Onset   Diabetes Mother    Cancer Mother        breast and stomach   Breast cancer Mother 72   Dementia Mother    Heart disease Father    Kidney disease Father        dialysis   Cancer Father        colon   Diabetes Father    Hypertension Sister    Hypertension Brother    Hypertension Daughter    Congestive Heart Failure Maternal Grandmother    Diabetes Maternal Grandmother    Hypertension Maternal Grandmother      Prior to Admission medications   Medication Sig Start Date End Date Taking? Authorizing Provider  atorvastatin (LIPITOR) 20 MG tablet Take 1 tablet (20 mg total) by mouth daily. TAKE 1 TABLET(20 MG) BY MOUTH  DAILY 12/04/21  Yes Johnson, Megan P, DO  DULoxetine (CYMBALTA) 60 MG capsule Take 1 capsule (60 mg total) by mouth daily. Home med. 04/17/22  Yes Darlin Priestly, MD  gabapentin (NEURONTIN) 300 MG capsule TAKE 2 CAPSULES(600 MG) BY MOUTH at BEDTIME and 1 CAPSULE (300MG ) In the AM and PM Patient taking differently: Take 300 mg by mouth 3 (three) times daily. Takes 1 cap. In am; 1 in afternoon and 2 cap. At bedtime 01/15/22  Yes Johnson, Megan P, DO  metoprolol tartrate (LOPRESSOR) 25 MG tablet Take 1 tablet (25 mg total) by mouth 2 (two) times daily. 04/17/22 07/16/22 Yes 07/18/22, MD  omeprazole (PRILOSEC) 40 MG capsule TAKE 1 CAPSULE(40 MG) BY MOUTH BID 01/15/22  Yes Johnson, Megan P, DO  sucralfate (CARAFATE) 1 g tablet Take 1 tablet (1 g total) by mouth 4 (four) times daily as needed. Home med. 04/17/22  Yes 04/19/22, MD  vitamin B-12 (CYANOCOBALAMIN) 1000 MCG tablet Take 1,000 mcg  by mouth daily.   Yes [provider]  Accu-Chek Softclix Lancets lancets TEST TWICE DAILY 10/14/20   Park Liter P, DO  aspirin EC 81 MG tablet Take 81 mg by mouth daily. Swallow whole.    [provider]  Cholecalciferol (VITAMIN D-3) 5000 units TABS Take 5,000 Units by mouth daily.    [provider]  enoxaparin (LOVENOX) 40 MG/0.4ML injection Inject 0.4 mLs (40 mg total) into the skin daily for 14 days. 04/18/22 05/02/22  Enzo Bi, MD  EPINEPHrine 0.3 mg/0.3 mL IJ SOAJ injection Inject 0.3 mg into the muscle as needed for anaphylaxis. 08/03/20   Johnson, Megan P, DO  fexofenadine (ALLERGY RELIEF) 180 MG tablet Take 1 tablet (180 mg total) by mouth daily as needed. Home med. Patient taking differently: Take 180 mg by mouth daily. Home med. 04/17/22   Enzo Bi, MD  magnesium gluconate (MAGONATE) 500 MG tablet Take 500 mg by mouth 2 (two) times daily.    [provider]  metFORMIN (GLUCOPHAGE) 500 MG tablet Take 2 tablets (1,000 mg total) by mouth 2 (two) times daily with a meal. 12/04/21    Johnson, Megan P, DO  potassium chloride (KLOR-CON) 10 MEQ tablet Take 10 mEq by mouth 3 (three) times daily.    [provider]    Physical Exam: Vitals:   05/03/22 1715 05/03/22 1730 05/03/22 1800 05/03/22 1821  BP: 121/83 97/72 103/66 100/71  Pulse: 79 82 86 86  Resp: 18 18 19 18   Temp: (!) 97.5 F (36.4 C)  97.6 F (36.4 C)   TempSrc:      SpO2: 96% 95% 94% 96%  Weight:      Height:       General: Not in acute distress HEENT:       Eyes: PERRL, EOMI, no scleral icterus.       ENT: No discharge from the ears and nose, no pharynx injection, no tonsillar enlargement.        Neck: No JVD, no bruit, no mass felt. Heme: No neck lymph node enlargement. Cardiac: S1/S2, RRR, No murmurs, No gallops or rubs. Respiratory: No rales, wheezing, rhonchi or rubs. GI: Soft, nondistended, nontender, no rebound pain, no organomegaly, BS present. GU: No hematuria Ext: No pitting leg edema bilaterally. 1+DP/PT pulse bilaterally. Musculoskeletal: s/p of right ankle surgery, has pain in right ankle surgical site Skin: No rashes.  Neuro: Drowsy, arousable, oriented X3, cranial nerves II-XII grossly intact, moves all extremities normally.  Psych: Patient is not psychotic, no suicidal or hemocidal ideation.  Labs on Admission: I have personally reviewed following labs and imaging studies  CBC: Recent Labs  Lab 05/03/22 1231  HGB 12.2  HCT 23.5   Basic Metabolic Panel: Recent Labs  Lab 05/03/22 1231 05/03/22 1736  NA 137  --   K 3.4*  --   CL 98  --   GLUCOSE 89  --   BUN <3*  --   CREATININE 0.60  --   MG  --  1.3*   GFR: Estimated Creatinine Clearance: 66.8 mL/min (by C-G formula based on SCr of 0.6 mg/dL). Liver Function Tests: No results for input(s): "AST", "ALT", "ALKPHOS", "BILITOT", "PROT", "ALBUMIN" in the last 168 hours. No results for input(s): "LIPASE", "AMYLASE" in the last 168 hours. No results for input(s): "AMMONIA" in the last 168 hours. Coagulation  Profile: No results for input(s): "INR", "PROTIME" in the last 168 hours. Cardiac Enzymes: No results for input(s): "CKTOTAL", "CKMB", "CKMBINDEX", "TROPONINI" in the  last 168 hours. BNP (last 3 results) No results for input(s): "PROBNP" in the last 8760 hours. HbA1C: No results for input(s): "HGBA1C" in the last 72 hours. CBG: Recent Labs  Lab 05/03/22 1155 05/03/22 1620  GLUCAP 91 125*   Lipid Profile: No results for input(s): "CHOL", "HDL", "LDLCALC", "TRIG", "CHOLHDL", "LDLDIRECT" in the last 72 hours. Thyroid Function Tests: No results for input(s): "TSH", "T4TOTAL", "FREET4", "T3FREE", "THYROIDAB" in the last 72 hours. Anemia Panel: No results for input(s): "VITAMINB12", "FOLATE", "FERRITIN", "TIBC", "IRON", "RETICCTPCT" in the last 72 hours. Urine analysis:    Component Value Date/Time   COLORURINE YELLOW (A) 12/11/2021 0925   APPEARANCEUR HAZY (A) 12/11/2021 0925   APPEARANCEUR Clear 03/06/2021 0944   LABSPEC 1.011 12/11/2021 0925   PHURINE 5.0 12/11/2021 0925   GLUCOSEU NEGATIVE 12/11/2021 0925   HGBUR NEGATIVE 12/11/2021 0925   BILIRUBINUR NEGATIVE 12/11/2021 0925   BILIRUBINUR Negative 03/06/2021 0944   KETONESUR NEGATIVE 12/11/2021 0925   PROTEINUR NEGATIVE 12/11/2021 0925   NITRITE NEGATIVE 12/11/2021 0925   LEUKOCYTESUR SMALL (A) 12/11/2021 0925   Sepsis Labs: @LABRCNTIP (procalcitonin:4,lacticidven:4) )No results found for this or any previous visit (from the past 240 hour(s)).   Radiological Exams on Admission: DG Ankle Complete Right  Result Date: 05/03/2022 CLINICAL DATA:  Postop EXAM: RIGHT ANKLE - COMPLETE 3+ VIEW COMPARISON:  04/14/2022 FINDINGS: Interval lateral surgical plate and fixating screws at the distal fibula. This bridges distal fibular fracture. Interval screw fixation of medial malleolar fracture. There is near anatomic alignment. Mildly displaced posterior malleolar fracture. Reduction of previously noted lateral talar subluxation. Small  amount of gas in the soft tissues consistent with recent surgery IMPRESSION: Interval surgical fixation of the distal tibia and fibular for trimalleolar fracture with expected postsurgical changes. Electronically Signed   By: 04/16/2022 M.D.   On: 05/03/2022 17:16   DG Ankle 2 Views Right  Result Date: 05/03/2022 CLINICAL DATA:  Right ankle ORIF EXAM: RIGHT ANKLE - 2 VIEW COMPARISON:  04/14/2022 FINDINGS: Six C-arm fluoroscopic images were obtained intraoperatively and submitted for post operative interpretation. Images obtained during right ankle ORIF with sideplate and screw fixation construct of the distal fibula and screw fixation of the medial malleolus. 32 seconds fluoroscopy time utilized. Radiation dose: Not provided . Please see the performing provider's procedural report for further detail. IMPRESSION: Intraoperative fluoroscopic guidance for right ankle ORIF. Electronically Signed   By: 04/16/2022 D.O.   On: 05/03/2022 15:44   DG C-Arm 1-60 Min-No Report  Result Date: 05/03/2022 Fluoroscopy was utilized by the requesting physician.  No radiographic interpretation.   DG C-Arm 1-60 Min-No Report  Result Date: 05/03/2022 Fluoroscopy was utilized by the requesting physician.  No radiographic interpretation.   07/02/2022 OR NERVE BLOCK-IMAGE ONLY [facility])  Result Date: 05/03/2022 There is no interpretation for this exam.  This order is for images obtained during a surgical procedure.  Please See "Surgeries" Tab for more information regarding the procedure.      Assessment/Plan Principal Problem:   Closed right ankle fracture Active Problems:   Type 2 diabetes mellitus with diabetic neuropathy, unspecified (HCC)   Hyperlipidemia   Essential hypertension   OSA (obstructive sleep apnea)   Closed displaced trimalleolar fracture of right ankle   Hypokalemia   Depression   Assessment and Plan:  Closed right ankle fracture (Closed displaced trimalleolar fracture of right ankle): s/p  of surgery by Dr. 07/02/2022.  -place in med-surg bed for obs -Fall precaution -PT/OT -Pain control: As needed morphine, Percocet,  Tylenol -As needed Robaxin -consult TOC for possible rehab placement  Type 2 diabetes mellitus with diabetic neuropathy, unspecified ([place]): Recent A1c 7.2, poorly controlled.  Patient still taking metformin -SSI  Hyperlipidemia -Lipitor  Essential hypertension -IV hydralazine as needed -Metoprolol  OSA (obstructive sleep apnea) -Not using CPAP currently  Hypokalemia: Potassium 3.4 -Repleted potassium -Check magnesium level  Depression -Continue home Cymbalta     DVT ppx: SCD only to left leg. SQ Lovenox will be started on 1/12 per Dr. Luana Shu  Code Status: Full code  Family Communication: not done, no family member is at bed side.     Disposition Plan:  Anticipate discharge back to previous environment  Consults called:  Dr. Luana Shu of podiatry  Admission status and Level of care: Med-Surg:    for obs    Dispo: The patient is from: Home              Anticipated d/c is to:  to be deternmined              Anticipated d/c date is: 1 day              Patient currently is not medically stable to d/c.    Severity of Illness:  The appropriate patient status for this patient is OBSERVATION. Observation status is judged to be reasonable and necessary in order to provide the required intensity of service to ensure the patient's safety. The patient's presenting symptoms, physical exam findings, and initial radiographic and laboratory data in the context of their medical condition is felt to place them at decreased risk for further clinical deterioration. Furthermore, it is anticipated that the patient will be medically stable for discharge from the hospital within 2 midnights of admission.        Date of Service 05/03/2022    Ivor Costa [facility]   If 7PM-7AM, please contact night-coverage [email or web address] 05/03/2022, 6:29 PM

## 2022-05-03 NOTE — Anesthesia Postprocedure Evaluation (Signed)
Anesthesia Post Note  Patient: Lisa Galvan  Procedure(s) Performed: OPEN REDUCTION INTERNAL FIXATION (ORIF) ANKLE FRACTURE - TRIMALLEOLAR WITH INTERNAL FIXATION (Right: Ankle)  Patient location during evaluation: PACU Anesthesia Type: Regional Level of consciousness: awake and alert Pain management: pain level controlled Vital Signs Assessment: post-procedure vital signs reviewed and stable Respiratory status: spontaneous breathing, nonlabored ventilation, respiratory function stable and patient connected to nasal cannula oxygen Cardiovascular status: blood pressure returned to baseline and stable Postop Assessment: no apparent nausea or vomiting Anesthetic complications: no  No notable events documented.   Last Vitals:  Vitals:   05/03/22 2035 05/03/22 2101  BP: 104/73 111/72  Pulse: 77 82  Resp: 18 16  Temp: 36.4 C (!) 36.4 C  SpO2: 95% 96%    Last Pain:  Vitals:   05/03/22 1821  TempSrc:   PainSc: [place]

## 2022-05-03 NOTE — Anesthesia Procedure Notes (Signed)
Procedure Name: Intubation Date/Time: 05/03/2022 1:45 PM  Performed by: Lily Peer, Breon Rehm, CRNAPre-anesthesia Checklist: Patient identified, Emergency Drugs available, Suction available and Patient being monitored Patient Re-evaluated:Patient Re-evaluated prior to induction Oxygen Delivery Method: Circle system utilized Preoxygenation: Pre-oxygenation with 100% oxygen Induction Type: IV induction Ventilation: Mask ventilation without difficulty Laryngoscope Size: McGraph and 3 Grade View: Grade I Tube type: Oral Tube size: 7.0 mm Number of attempts: 1 Airway Equipment and Method: Stylet Placement Confirmation: ETT inserted through vocal cords under direct vision, positive ETCO2 and breath sounds checked- equal and bilateral Secured at: 21 cm Tube secured with: Tape Dental Injury: Teeth and Oropharynx as per pre-operative assessment

## 2022-05-03 NOTE — Progress Notes (Signed)
Patient 100% dinner tray.  Bone therapy on right ankle, per provider on for three hours. On at 1625 for three hours, off at 1925.  Per provider x1 a day for three hours.

## 2022-05-03 NOTE — Anesthesia Preprocedure Evaluation (Addendum)
Anesthesia Evaluation  Patient identified by MRN, date of birth, ID band Patient awake    Reviewed: Allergy & Precautions, NPO status , Patient's Chart, lab work & pertinent test results  History of Anesthesia Complications Negative for: history of anesthetic complications  Airway Mallampati: IV   Neck ROM: Full    Dental no notable dental hx.    Pulmonary sleep apnea    Pulmonary exam normal breath sounds clear to auscultation       Cardiovascular hypertension, Normal cardiovascular exam Rhythm:Regular Rate:Normal  ECG 12/11/21:  Unusual P axis, possible ectopic atrial tachycardia Nonspecific T wave abnormality  Myocardial perfusion 11/18/20: Normal myocardial perfusion without evidence of myocardial ischemia  Echo 11/18/20:  NORMAL LEFT VENTRICULAR SYSTOLIC FUNCTION   WITH MODERATE LVH  NORMAL RIGHT VENTRICULAR SYSTOLIC FUNCTION  TRIVIAL REGURGITATION NOTED  NO VALVULAR STENOSIS  ESTIMATED LVEF >55%  Aortic: TRIVIAL AI  Mitral: TRIVIAL MR  Tricuspid: TRIVIAL TR  Pulmonic: TRIVIAL P    Neuro/Psych  Neuromuscular disease (neuropathy)    GI/Hepatic ,GERD  ,,  Endo/Other  diabetes, Type 2    Renal/GU negative Renal ROS     Musculoskeletal  (+) Arthritis ,    Abdominal   Peds  Hematology  (+) Blood dyscrasia, anemia   Anesthesia Other Findings Note: lidocaine allergy was a rash to lidocaine patch; tolerated IV lidocaine for last surgery   Cardiology note 12/13/21:  62 y.o. female with  Encounter Diagnoses  Name Primary?  Benign essential HTN Yes  Aortic atherosclerosis (CMS-HCC)  LVH (left ventricular hypertrophy) due to hypertensive disease, without heart failure  Mixed hyperlipidemia  OSA (obstructive sleep apnea)  SOBOE (shortness of breath on exertion)  Type 2 diabetes mellitus with diabetic neuropathy, without long-term current use of insulin (CMS-HCC)   Plan  -No addition of medications at this  time for essential hypertension. We have discussed the need for continued monitoring in the future.  -We have had a long discussion about the risk factors, causes, treatments, and manifestations of left ventricular hypertrophy. The long-term plan is to use medications, diet, exercise counseling, and risk factor modification to reduce future symptoms, manifestations, and the potential for congestive heart failure. -We have discussed risk reduction in the cardiovascular disease process by a continuation of lipid management with the current medication management for lipid reduction. The goals continue to be 30-50% lowering of LDL cholesterol in addition to lifestyle measures. This will include diet and improved activity level on a regular basis.The patient has an understanding of this discussion at this time and we will continue the appropriate strategy. -We have had a discussion of the benefits of CPAP machine use as well as its possible frustrations. The patient is encouraged to continue diligent use CPAP machine for reduction of sleep apnea signs and symptoms which may include shortness of breath, hypertension, and lower extremity edema. There also will be close evaluation of the equipment and future symptom resolution on a regular basis to provide the best outcomes of treatment. -No further intervention shortness of breath reported above multifactorial in nature including age, decreased exercise tolerance, disability, and/or medication management. The patient is to follow for any worsening symptoms or increase in severity for further in need in investigation or treatment options.  -Continue aggressive medical management of diabetes following the ABCs for prevention of cardiovascular disease and complications. The goals set forth include a goal HbA1c of less than 7, moderate to high intensity statin use if patient can tolerate, and a goal systolic blood pressure  of below 171mm.  -The patient has been  instructed to adhere to improving healthy lifestyle. We have specifically addressed the most important factors including abstinence of tobacco use, regular physical activity including the possibility of exercise prescription, healthy diet, and maintaining an appropriate body mass index.  Orders Placed This Encounter  Procedures  ORDER   Return if symptoms worsen or fail to improve.    Reproductive/Obstetrics                             Anesthesia Physical Anesthesia Plan  ASA: 2  Anesthesia Plan: General and Regional   Post-op Pain Management: Regional block*   Induction: Intravenous  PONV Risk Score and Plan: 3 and Ondansetron, Dexamethasone, Treatment may vary due to age or medical condition, Propofol infusion and TIVA  Airway Management Planned: Natural Airway  Additional Equipment:   Intra-op Plan:   Post-operative Plan:   Informed Consent: I have reviewed the patients History and Physical, chart, labs and discussed the procedure including the risks, benefits and alternatives for the proposed anesthesia with the patient or authorized representative who has indicated his/her understanding and acceptance.       Plan Discussed with: CRNA  Anesthesia Plan Comments: (Plan for preoperative adductor saphenous and popliteal sciatic nerve blocks. LMA/GETA backup discussed.  Patient consented for risks of anesthesia including but not limited to:  - adverse reactions to medications - damage to eyes, teeth, lips or other oral mucosa - nerve damage due to positioning  - sore throat or hoarseness - damage to heart, brain, nerves, lungs, other parts of body or loss of life  Informed patient about role of CRNA in peri- and intra-operative care.  Patient voiced understanding.)        Anesthesia Quick Evaluation

## 2022-05-04 ENCOUNTER — Ambulatory Visit: Payer: Self-pay | Admitting: *Deleted

## 2022-05-04 ENCOUNTER — Encounter: Payer: Self-pay | Admitting: Podiatry

## 2022-05-04 DIAGNOSIS — X58XXXA Exposure to other specified factors, initial encounter: Secondary | ICD-10-CM | POA: Diagnosis not present

## 2022-05-04 DIAGNOSIS — E1140 Type 2 diabetes mellitus with diabetic neuropathy, unspecified: Secondary | ICD-10-CM | POA: Diagnosis not present

## 2022-05-04 DIAGNOSIS — Z79899 Other long term (current) drug therapy: Secondary | ICD-10-CM | POA: Diagnosis not present

## 2022-05-04 DIAGNOSIS — S82431A Displaced oblique fracture of shaft of right fibula, initial encounter for closed fracture: Secondary | ICD-10-CM | POA: Diagnosis not present

## 2022-05-04 DIAGNOSIS — F32A Depression, unspecified: Secondary | ICD-10-CM | POA: Diagnosis not present

## 2022-05-04 DIAGNOSIS — Z7984 Long term (current) use of oral hypoglycemic drugs: Secondary | ICD-10-CM | POA: Diagnosis not present

## 2022-05-04 DIAGNOSIS — E876 Hypokalemia: Secondary | ICD-10-CM | POA: Diagnosis not present

## 2022-05-04 DIAGNOSIS — S82851A Displaced trimalleolar fracture of right lower leg, initial encounter for closed fracture: Secondary | ICD-10-CM | POA: Diagnosis not present

## 2022-05-04 DIAGNOSIS — E785 Hyperlipidemia, unspecified: Secondary | ICD-10-CM | POA: Diagnosis not present

## 2022-05-04 DIAGNOSIS — Z7982 Long term (current) use of aspirin: Secondary | ICD-10-CM | POA: Diagnosis not present

## 2022-05-04 DIAGNOSIS — I10 Essential (primary) hypertension: Secondary | ICD-10-CM | POA: Diagnosis not present

## 2022-05-04 LAB — BASIC METABOLIC PANEL
Anion gap: 10 (ref 5–15)
BUN: 7 mg/dL — ABNORMAL LOW (ref 8–23)
CO2: 23 mmol/L (ref 22–32)
Calcium: 8.4 mg/dL — ABNORMAL LOW (ref 8.9–10.3)
Chloride: 102 mmol/L (ref 98–111)
Creatinine, Ser: 0.9 mg/dL (ref 0.44–1.00)
GFR, Estimated: >60 mL/min (ref >60)
Glucose, Bld: 248 mg/dL — ABNORMAL HIGH (ref 70–99)
Potassium: 4.2 mmol/L (ref 3.5–5.1)
Sodium: 135 mmol/L (ref 135–145)

## 2022-05-04 LAB — GLUCOSE, CAPILLARY
Glucose-Capillary: 205 mg/dL — ABNORMAL HIGH (ref 70–99)
Glucose-Capillary: 150 mg/dL — ABNORMAL HIGH (ref 70–99)
Glucose-Capillary: 139 mg/dL — ABNORMAL HIGH (ref 70–99)
Glucose-Capillary: 58 mg/dL — ABNORMAL LOW (ref 70–99)
Glucose-Capillary: 106 mg/dL — ABNORMAL HIGH (ref 70–99)

## 2022-05-04 LAB — CBC
HCT: 34.3 % — ABNORMAL LOW (ref 36.0–46.0)
Hemoglobin: 11.3 g/dL — ABNORMAL LOW (ref 12.0–15.0)
MCH: 27.9 pg (ref 26.0–34.0)
MCHC: 32.9 g/dL (ref 30.0–36.0)
MCV: 84.7 fL (ref 80.0–100.0)
Platelets: 411 10*3/uL — ABNORMAL HIGH (ref 150–400)
RBC: 4.05 MIL/uL (ref 3.87–5.11)
RDW: 17.2 % — ABNORMAL HIGH (ref 11.5–15.5)
WBC: 5.6 10*3/uL (ref 4.0–10.5)
nRBC: 0 % (ref 0.0–0.2)

## 2022-05-04 LAB — MAGNESIUM: Magnesium: 1.3 mg/dL — ABNORMAL LOW (ref 1.7–2.4)

## 2022-05-04 MED ORDER — ENOXAPARIN SODIUM 40 MG/0.4ML IJ SOSY
40.0000 mg | PREFILLED_SYRINGE | INTRAMUSCULAR | Status: DC
  Administered 2022-05-04 – 2022-05-11 (×8): 40 mg via SUBCUTANEOUS
  Filled 2022-05-04 (×8): qty 0.4

## 2022-05-04 MED ORDER — LORATADINE 10 MG PO TABS
10.0000 mg | ORAL_TABLET | Freq: Every day | ORAL | Status: DC
  Administered 2022-05-04 – 2022-05-12 (×9): 10 mg via ORAL
  Filled 2022-05-04 (×9): qty 1

## 2022-05-04 MED ORDER — ONDANSETRON HCL 4 MG PO TABS: 4.0000 mg | ORAL_TABLET | Freq: Three times a day (TID) | ORAL | Status: DC | PRN

## 2022-05-04 MED ORDER — INSULIN GLARGINE-YFGN 100 UNIT/ML ~~LOC~~ SOLN
5.0000 [IU] | Freq: Two times a day (BID) | SUBCUTANEOUS | Status: DC
  Administered 2022-05-04 – 2022-05-05 (×2): 5 [IU] via SUBCUTANEOUS
  Filled 2022-05-04 (×4): qty 0.05

## 2022-05-04 MED ORDER — GABAPENTIN 300 MG PO CAPS
600.0000 mg | ORAL_CAPSULE | Freq: Every day | ORAL | Status: DC
  Administered 2022-05-04 – 2022-05-11 (×8): 600 mg via ORAL
  Filled 2022-05-04 (×8): qty 2

## 2022-05-04 MED ORDER — AMOXICILLIN-POT CLAVULANATE 875-125 MG PO TABS
1.0000 | ORAL_TABLET | Freq: Two times a day (BID) | ORAL | Status: AC
  Administered 2022-05-04 – 2022-05-10 (×14): 1 via ORAL
  Filled 2022-05-04 (×14): qty 1

## 2022-05-04 MED ORDER — ACETAMINOPHEN 325 MG PO TABS
650.0000 mg | ORAL_TABLET | Freq: Four times a day (QID) | ORAL | Status: DC | PRN
  Administered 2022-05-04: 650 mg via ORAL
  Filled 2022-05-04 (×2): qty 2

## 2022-05-04 MED ORDER — INSULIN ASPART 100 UNIT/ML IJ SOLN
3.0000 [IU] | Freq: Three times a day (TID) | INTRAMUSCULAR | Status: DC
  Administered 2022-05-04 – 2022-05-12 (×18): 3 [IU] via SUBCUTANEOUS
  Filled 2022-05-04 (×18): qty 1

## 2022-05-04 MED ORDER — SUCRALFATE 1 G PO TABS: 1.0000 g | ORAL_TABLET | Freq: Four times a day (QID) | ORAL | Status: DC | PRN

## 2022-05-04 MED ORDER — VITAMIN D 25 MCG (1000 UNIT) PO TABS
5000.0000 [IU] | ORAL_TABLET | Freq: Every day | ORAL | Status: DC
  Administered 2022-05-04 – 2022-05-12 (×9): 5000 [IU] via ORAL
  Filled 2022-05-04 (×9): qty 5

## 2022-05-04 MED ORDER — MAGNESIUM GLUCONATE 500 MG PO TABS
500.0000 mg | ORAL_TABLET | Freq: Two times a day (BID) | ORAL | Status: DC
  Administered 2022-05-04 – 2022-05-12 (×17): 500 mg via ORAL
  Filled 2022-05-04 (×18): qty 1

## 2022-05-04 MED ORDER — ASPIRIN 81 MG PO TBEC
81.0000 mg | DELAYED_RELEASE_TABLET | Freq: Every day | ORAL | Status: DC
  Administered 2022-05-04 – 2022-05-12 (×9): 81 mg via ORAL
  Filled 2022-05-04 (×9): qty 1

## 2022-05-04 MED ORDER — DULOXETINE HCL 30 MG PO CPEP
60.0000 mg | ORAL_CAPSULE | Freq: Every day | ORAL | Status: DC
  Administered 2022-05-04 – 2022-05-12 (×9): 60 mg via ORAL
  Filled 2022-05-04 (×9): qty 2

## 2022-05-04 MED ORDER — METOPROLOL TARTRATE 25 MG PO TABS
25.0000 mg | ORAL_TABLET | Freq: Two times a day (BID) | ORAL | Status: DC
  Administered 2022-05-04 – 2022-05-12 (×18): 25 mg via ORAL
  Filled 2022-05-04 (×17): qty 1

## 2022-05-04 MED ORDER — MAGNESIUM SULFATE 4 GM/100ML IV SOLN
4.0000 g | Freq: Once | INTRAVENOUS | Status: AC
  Administered 2022-05-04: 4 g via INTRAVENOUS
  Filled 2022-05-04: qty 100

## 2022-05-04 MED ORDER — GABAPENTIN 300 MG PO CAPS
300.0000 mg | ORAL_CAPSULE | Freq: Two times a day (BID) | ORAL | Status: DC
  Administered 2022-05-04 – 2022-05-12 (×17): 300 mg via ORAL
  Filled 2022-05-04 (×17): qty 1

## 2022-05-04 MED ORDER — VITAMIN B-12 1000 MCG PO TABS
1000.0000 ug | ORAL_TABLET | Freq: Every day | ORAL | Status: DC
  Administered 2022-05-04 – 2022-05-12 (×9): 1000 ug via ORAL
  Filled 2022-05-04 (×9): qty 1

## 2022-05-04 MED ORDER — ATORVASTATIN CALCIUM 20 MG PO TABS
20.0000 mg | ORAL_TABLET | Freq: Every day | ORAL | Status: DC
  Administered 2022-05-04 – 2022-05-12 (×9): 20 mg via ORAL
  Filled 2022-05-04 (×9): qty 1

## 2022-05-04 MED ORDER — EPINEPHRINE 0.3 MG/0.3ML IJ SOAJ
0.3000 mg | INTRAMUSCULAR | Status: DC | PRN
  Filled 2022-05-04: qty 0.3

## 2022-05-04 MED ORDER — PANTOPRAZOLE SODIUM 40 MG PO TBEC
40.0000 mg | DELAYED_RELEASE_TABLET | Freq: Every day | ORAL | Status: DC
  Administered 2022-05-04 – 2022-05-12 (×9): 40 mg via ORAL
  Filled 2022-05-04 (×9): qty 1

## 2022-05-04 NOTE — Progress Notes (Signed)
Physical Therapy Treatment Patient Details Name: Lisa Galvan MRN: [id number] DOB: 25-Jan-1961 Today's Date: 05/04/2022   History of Present Illness Pt is a 62 y.o. female with medical history significant of hypertension, hyperlipidemia, diabetes mellitus, GERD, depression, chronic pain syndrome, OSA not on CPAP, who presents for surgical repair of right ankle fracture.  Pt diagnosed with right trimalleolar ankle fracture and is s/p ORIF.    PT Comments    Pt was pleasant and motivated to participate during the session and put forth good effort throughout. Pt demonstrated some carryover of proper sequencing with transfers but continued to require extensive cuing. Pt was able to ambulate with hop-to pattern 2 x 10 feet with grossly improved stability compared to the prior session.  Pt reported no adverse symptoms other than mod R ankle pain with SpO2 and HR WNL on room air.  Pt's RLE NWB status maintained throughout the session. Pt will benefit from PT services in a SNF setting upon discharge to safely address deficits listed in patient problem list for decreased caregiver assistance and eventual return to PLOF.     Recommendations for follow up therapy are one component of a multi-disciplinary discharge planning process, led by the attending physician.  Recommendations may be updated based on patient status, additional functional criteria and insurance authorization.  Follow Up Recommendations  Skilled nursing-short term rehab (<3 hours/day) Can patient physically be transported by private vehicle: No   Assistance Recommended at Discharge Frequent or constant Supervision/Assistance  Patient can return home with the following A lot of help with walking and/or transfers;A little help with bathing/dressing/bathroom;Assistance with cooking/housework;Help with stairs or ramp for entrance;Assist for transportation   Equipment Recommendations  Other (comment) (TBD at next venue of care)     Recommendations for Other Services       Precautions / Restrictions Precautions Precautions: Fall Restrictions Weight Bearing Restrictions: Yes RLE Weight Bearing: Non weight bearing     Mobility  Bed Mobility               General bed mobility comments: NT, pt in recliner    Transfers Overall transfer level: Needs assistance Equipment used: Rolling walker (2 wheels) Transfers: Sit to/from Stand Sit to Stand: Min assist           General transfer comment: Pt required extensive training on proper sequencing with sit to stand transfers with heavy cuing for proper hand placement and increased trunk flexion during standing and sitting; good compliance with WB status throughout with no cues needed for RLE positioning    Ambulation/Gait Ambulation/Gait assistance: Min guard Gait Distance (Feet): 10 Feet x 2 Assistive device: Rolling walker (2 wheels)   Gait velocity: decreased     General Gait Details: Hop-to pattern with min verbal and visual cues for proper sequencing and min A for stability and walker management   Stairs             Wheelchair Mobility    Modified Rankin (Stroke Patients Only)       Balance Overall balance assessment: History of Falls, Needs assistance     Sitting balance - Comments: Unsupported sitting not tested   Standing balance support: Bilateral upper extremity supported, During functional activity, Reliant on assistive device for balance Standing balance-Leahy Scale: Fair                              Cognition Arousal/Alertness: Awake/alert Behavior During Therapy: WFL for tasks assessed/performed Overall  Cognitive Status: Within Functional Limits for tasks assessed                                          Exercises Total Joint Exercises Ankle Circles/Pumps: AROM, Strengthening, Left, 5 reps, 10 reps (with manual resistance) Quad Sets: Strengthening, Both, 10 reps Gluteal Sets:  Strengthening, Both, 10 reps Hip ABduction/ADduction: Strengthening, Both, 10 reps Straight Leg Raises: Strengthening, Both, 5 reps Long Arc Quad: Strengthening, Both, 10 reps Knee Flexion: Strengthening, Both, 10 reps Bridges: Strengthening, Both, 10 reps (RLE force through bolster under R knee) Other Exercises Other Exercises: HEP review for RLE toe wiggles, LLE APs, and BLE QS and GS x 10 every 1-2 hours daily, BLE SLR x 5 2x/day    General Comments        Pertinent Vitals/Pain Pain Assessment Pain Assessment: 0-10 Pain Score: 5  Pain Location: R ankle Pain Descriptors / Indicators: Sore Pain Intervention(s): Repositioned, Premedicated before session, Monitored during session    Home Living                          Prior Function            PT Goals (current goals can now be found in the care plan section) Progress towards PT goals: Progressing toward goals    Frequency    BID      PT Plan Current plan remains appropriate    Co-evaluation              AM-PAC PT "6 Clicks" Mobility   Outcome Measure  Help needed turning from your back to your side while in a flat bed without using bedrails?: A Little Help needed moving from lying on your back to sitting on the side of a flat bed without using bedrails?: A Little Help needed moving to and from a bed to a chair (including a wheelchair)?: A Little Help needed standing up from a chair using your arms (e.g., wheelchair or bedside chair)?: A Little Help needed to walk in hospital room?: A Lot Help needed climbing 3-5 steps with a railing? : Total 6 Click Score: 15    End of Session Equipment Utilized During Treatment: Gait belt Activity Tolerance: Patient tolerated treatment well Patient left: in chair;with call bell/phone within reach Nurse Communication: Mobility status PT Visit Diagnosis: Unsteadiness on feet (R26.81);Other abnormalities of gait and mobility (R26.89);History of falling  (Z91.81);Muscle weakness (generalized) (M62.81);Pain Pain - Right/Left: Right Pain - part of body: Ankle and joints of foot     Time: 7793-9030 PT Time Calculation (min) (ACUTE ONLY): 24 min  Charges:  $Gait Training: 8-22 mins $Therapeutic Exercise: 8-22 mins                     D. Scott Rebecca Cairns PT, DPT 05/04/22, 5:04 PM

## 2022-05-04 NOTE — Hospital Course (Addendum)
Taken from H&P.   Lisa Galvan is a 62 y.o. female with medical history significant of hypertension, hyperlipidemia, diabetes mellitus, GERD, depression, chronic pain syndrome, OSA not on CPAP, who presents with right ankle pain, apparently patient injured her right ankle 2 weeks ago and was found to have closed displaced trimalleolar fracture of right ankle. Pt underwent rIGHT TRIMALLEOLAR ANKLE FRACTURE ORIF by Dr. Luana Shu on the day of admission.  Data reviewed independently and ED Course: pt was found to have hemoglobin 12.2, renal function okay, potassium 3.4, temperature normal, blood pressure 170/64, heart rate 75, oxygen saturation 98% on room air.   Patient was admitted for further PT/OT evaluation-possible placement and pain control.  1/12: Mildly elevated blood pressure at 155/92.  CBG elevated at 205, CBC with slight decrease of hemoglobin to 11.3 and mild reactive thrombocytosis at 411.  Adding Semglee and mealtime coverage along with SSI.  PT/OT are recommending SNF.  TOC is aware and working on it.  1/13: Hemodynamically stable.  Awaiting SNF placement  1/14: No new concern.  Awaiting SNF placement, medically stable.  1/15: Remained stable, still no bed offer.  1/16: Remained stable, had a bed [employer] authorization now.

## 2022-05-04 NOTE — Progress Notes (Signed)
  Progress Note   Patient: Lisa Galvan QBH:[id number] DOB: 1960/09/03 DOA: 05/03/2022     1 DOS: the patient was seen and examined on 05/04/2022   Brief hospital course: Taken from H&P.   Lisa Galvan is a 62 y.o. female with medical history significant of hypertension, hyperlipidemia, diabetes mellitus, GERD, depression, chronic pain syndrome, OSA not on CPAP, who presents with right ankle pain, apparently patient injured her right ankle 2 weeks ago and was found to have closed displaced trimalleolar fracture of right ankle. Pt underwent rIGHT TRIMALLEOLAR ANKLE FRACTURE ORIF by Dr. Luana Shu on the day of admission.  Data reviewed independently and ED Course: pt was found to have hemoglobin 12.2, renal function okay, potassium 3.4, temperature normal, blood pressure 170/64, heart rate 75, oxygen saturation 98% on room air.   Patient was admitted for further PT/OT evaluation-possible placement and pain control.  1/12: Mildly elevated blood pressure at 155/92.  CBG elevated at 205, CBC with slight decrease of hemoglobin to 11.3 and mild reactive thrombocytosis at 411.  Adding Semglee and mealtime coverage along with SSI.  PT/OT are recommending SNF.  TOC is aware and working on it.  Assessment and Plan: * Closed displaced trimalleolar fracture of right ankle S/p ORIF by podiatry. PT/OT recommending SNF. -Continue with pain management  Depression -Continue home Cymbalta  Hypokalemia Hypomagnesemia.  Potassium improved with repletion.  Magnesium at 1.3. -Replete magnesium -Continue to monitor  OSA (obstructive sleep apnea) Not using CPAP currently  Essential hypertension -Continue with home metoprolol -As needed hydralazine  Hyperlipidemia -Continue Lipitor  Type 2 diabetes mellitus with diabetic neuropathy, unspecified (HCC) CBG elevated, recent A1c of 7.2.  Patient was on metformin at home. -Add Semglee -Add mealtime coverage -Continue with SSI    Subjective: Patient was seen  and examined today.  Continued to have right ankle pain, increased with ambulation.  Physical Exam: Vitals:   05/04/22 0155 05/04/22 0427 05/04/22 0819 05/04/22 1555  BP: 131/88 (!) 149/83 (!) 155/92 109/72  Pulse: 78 91 77 77  Resp: 18 18 17 17   Temp: 98 F (36.7 C) 97.8 F (36.6 C) 98.3 F (36.8 C) 98.1 F (36.7 C)  TempSrc:      SpO2: 97% 98% 99% 97%  Weight:      Height:       General.  Well-developed lady, in no acute distress. Pulmonary.  Lungs clear bilaterally, normal respiratory effort. CV.  Regular rate and rhythm, no JVD, rub or murmur. Abdomen.  Soft, nontender, nondistended, BS positive. CNS.  Alert and oriented .  No focal neurologic deficit. Extremities.  No edema, no cyanosis, pulses intact and symmetrical.  Right foot with Ace wrap Psychiatry.  Judgment and insight appears normal.   Data Reviewed: Prior data reviewed  Family Communication: Discussed with patient  Disposition: Status is: Observation The patient will require care spanning > 2 midnights and should be moved to inpatient because: Severity of illness  Planned Discharge Destination: Skilled nursing facility  DVT prophylaxis.  Lovenox Time spent: 40 minutes  This record has been created using Systems analyst. Errors have been sought and corrected,but may not always be located. Such creation errors do not reflect on the standard of care.   Author: Lorella Nimrod, MD 05/04/2022 4:43 PM  For on call review [email or web address].

## 2022-05-04 NOTE — Plan of Care (Signed)

## 2022-05-04 NOTE — Patient Instructions (Signed)
Visit Information  Thank you for taking time to visit with me today. Please don't hesitate to contact me if I can be of assistance to you before our next scheduled telephone appointment.  Following are the goals we discussed today:  Notify this RNCM if you go home vs to rehab.   Our next appointment is depending on if you discharge to home or rehab.   Please call the care guide team at [phone] if you need to cancel or reschedule your appointment.   Please call the Suicide and Crisis Lifeline: 988 call the [place] National Suicide Prevention Lifeline: 631 163 8269 or TTY: 515-477-7046 TTY 203-375-8858) to talk to a trained counselor call 1-800-273-TALK (toll free, 24 hour hotline) call 911 if you are experiencing a Mental Health or [place] or need someone to talk to.  Patient verbalizes understanding of instructions and care plan provided today and agrees to view in [place]. Active MyChart status and patient understanding of how to access instructions and care plan via MyChart confirmed with patient.     The patient has been provided with contact information for the care management team and has been advised to call with any health related questions or concerns.   Valente David, RN, MSN, [place] [facility] Care Management Coordinator [phone]

## 2022-05-04 NOTE — Assessment & Plan Note (Signed)
Hypomagnesemia.  Potassium improved with repletion.  Magnesium at 1.3. -Replete magnesium -Continue to monitor

## 2022-05-04 NOTE — Evaluation (Signed)
Occupational Therapy Evaluation Patient Details Name: Lisa Galvan MRN: [id number] DOB: [birth year] Today's Date: 05/04/2022   History of Present Illness Pt is a 62 y.o. female with medical history significant of hypertension, hyperlipidemia, diabetes mellitus, GERD, depression, chronic pain syndrome, OSA not on CPAP, who presents for surgical repair of right ankle fracture.  Pt diagnosed with right trimalleolar ankle fracture and is s/p ORIF.   Clinical Impression   Patient presenting with decreased Ind in self care,balance, functional mobility/transfers, endurance, and safety awareness. Patient reports being Ind at baseline and living at home alone prior to mechanical fall resulting in fx. Pt has recently been at home awaiting surgery and primarily transferring to wheelchair independently with family and friends to assist with IADLs. However, daughter also with recent surgery and friends returning to work after holiday leaving her with limited assistance. Pt is fearful of using RW secondary to fall several years ago with it's use. Increased education on proper placement and hand technique. Pt needing min A for bed mobility and for several hops to the R to transfer into recliner chair. Set up A for UB self care and likely mod A for LB self care. Pt declined toileting attempts at this time.  Patient will benefit from acute OT to increase overall independence in the areas of ADLs, functional mobility,and safety awareness  in order to safely discharge to next venue of care.      Recommendations for follow up therapy are one component of a multi-disciplinary discharge planning process, led by the attending physician.  Recommendations may be updated based on patient status, additional functional criteria and insurance authorization.   Follow Up Recommendations  Skilled nursing-short term rehab (<3 hours/day)     Assistance Recommended at Discharge Intermittent Supervision/Assistance  Patient can return  home with the following A lot of help with walking and/or transfers;A lot of help with bathing/dressing/bathroom;Help with stairs or ramp for entrance;Assist for transportation;Assistance with cooking/housework    Functional Status Assessment  Patient has had a recent decline in their functional status and demonstrates the ability to make significant improvements in function in a reasonable and predictable amount of time.  Equipment Recommendations  Other (comment) (defer to next venue of care)       Precautions / Restrictions Precautions Precautions: Fall Required Braces or Orthoses: Splint/Cast Splint/Cast: R ankle Restrictions Weight Bearing Restrictions: Yes RLE Weight Bearing: Non weight bearing      Mobility Bed Mobility Overal bed mobility: Needs Assistance Bed Mobility: Supine to Sit     Supine to sit: Min assist     General bed mobility comments: assist with R LE    Transfers Overall transfer level: Needs assistance Equipment used: Rolling walker (2 wheels) Transfers: Sit to/from Stand, Bed to chair/wheelchair/BSC Sit to Stand: Min assist     Step pivot transfers: Min assist            Balance Overall balance assessment: Needs assistance, History of Falls Sitting-balance support: Feet supported Sitting balance-Leahy Scale: Good     Standing balance support: Bilateral upper extremity supported, During functional activity, Reliant on assistive device for balance Standing balance-Leahy Scale: Poor                             ADL either performed or assessed with clinical judgement   ADL Overall ADL's : Needs assistance/impaired  Vision Patient Visual Report: No change from baseline              Pertinent Vitals/Pain Pain Assessment Pain Assessment: 0-10 Pain Score: 4  Pain Location: R ankle Pain Descriptors / Indicators: Sore Pain Intervention(s): Repositioned,  Premedicated before session, Monitored during session     Hand Dominance     Extremity/Trunk Assessment Upper Extremity Assessment Upper Extremity Assessment: Overall WFL for tasks assessed   Lower Extremity Assessment Lower Extremity Assessment: RLE deficits/detail;Generalized weakness RLE: Unable to fully assess due to immobilization       Communication Communication Communication: No difficulties   Cognition Arousal/Alertness: Awake/alert Behavior During Therapy: WFL for tasks assessed/performed Overall Cognitive Status: Within Functional Limits for tasks assessed                                                  Home Living Family/patient expects to be discharged to:: Private residence Living Arrangements: Alone Available Help at Discharge: Family;Available PRN/intermittently Type of Home: House Home Access: Stairs to enter Entrance Stairs-Number of Steps: 1 Entrance Stairs-Rails: None Home Layout: One level     Bathroom Shower/Tub: Producer, television/film/video: Handicapped height     Home Equipment: Rollator (4 wheels);Shower seat - built in;Standard Walker;BSC/3in1;Wheelchair - manual;Cane - single point   Additional Comments: Pt's daughter with recent surgery and not available for physical assist at home      Prior Functioning/Environment Prior Level of Function : Independent/Modified Independent             Mobility Comments: Ind amb community distances without an AD prior to ankle fracture ADLs Comments: Ind with ADLs        OT Problem List: Decreased strength;Decreased activity tolerance;Impaired balance (sitting and/or standing);Decreased knowledge of use of DME or AE;Decreased safety awareness;Decreased knowledge of precautions;Pain      OT Treatment/Interventions: Self-care/ADL training;Therapeutic exercise;Therapeutic activities;DME and/or AE instruction;Patient/family education;Balance training;Energy  conservation;Cognitive remediation/compensation    OT Goals(Current goals can be found in the care plan section) Acute Rehab OT Goals Patient Stated Goal: to get stronger and go to rehab OT Goal Formulation: With patient Time For Goal Achievement: 05/18/22 Potential to Achieve Goals: Good ADL Goals Pt Will Perform Lower Body Dressing: with supervision;sit to/from stand Pt Will Transfer to Toilet: with supervision;ambulating Pt Will Perform Toileting - Clothing Manipulation and hygiene: with supervision;sit to/from stand  OT Frequency: Min 2X/week       AM-PAC OT "6 Clicks" Daily Activity     Outcome Measure Help from another person eating meals?: None Help from another person taking care of personal grooming?: None Help from another person toileting, which includes using toliet, bedpan, or urinal?: A Lot Help from another person bathing (including washing, rinsing, drying)?: A Lot Help from another person to put on and taking off regular upper body clothing?: None Help from another person to put on and taking off regular lower body clothing?: A Lot 6 Click Score: 18   End of Session Equipment Utilized During Treatment: Rolling walker (2 wheels) Nurse Communication: Mobility status  Activity Tolerance: Patient tolerated treatment well Patient left: in bed;with call bell/phone within reach;with bed alarm set  OT Visit Diagnosis: Unsteadiness on feet (R26.81);Repeated falls (R29.6);Muscle weakness (generalized) (M62.81)                Time: 7564-3329 OT Time Calculation (  min): 24 min Charges:  OT General Charges $OT Visit: 1 Visit OT Evaluation $OT Eval Moderate Complexity: 1 Mod OT Treatments $Therapeutic Activity: 8-22 mins Darleen Crocker, MS, OTR/L , CBIS ascom [phone]  05/04/22, 11:42 AM

## 2022-05-04 NOTE — Evaluation (Signed)
Physical Therapy Evaluation Patient Details Name: Lisa Galvan MRN: [id number] DOB: July 31, 1960 Today's Date: 05/04/2022  History of Present Illness  Pt is a 62 y.o. female with medical history significant of hypertension, hyperlipidemia, diabetes mellitus, GERD, depression, chronic pain syndrome, OSA not on CPAP, who presents for surgical repair of right ankle fracture.  Pt diagnosed with right trimalleolar ankle fracture and is s/p ORIF.   Clinical Impression  Pt was pleasant and motivated to participate during the session and put forth good effort throughout. Pt required extensive training on proper sequencing during sit to/from stand transfer training with assistance needed varying from Mod A with poor technique to min A with improved technique.  Pt able to amb 2 x 5 feet with hop-to sequencing again with heavy multi-modal cuing for proper sequencing and min A.  Pt has no physical assistance at home and is at high risk for WB status non-compliance and further functional decline and would not be safe to return to her piror living situation at this time.  Pt will benefit from PT services in a SNF setting upon discharge to safely address deficits listed in patient problem list for decreased caregiver assistance and eventual return to PLOF.         Recommendations for follow up therapy are one component of a multi-disciplinary discharge planning process, led by the attending physician.  Recommendations may be updated based on patient status, additional functional criteria and insurance authorization.  Follow Up Recommendations Skilled nursing-short term rehab (<3 hours/day) Can patient physically be transported by private vehicle: No    Assistance Recommended at Discharge Frequent or constant Supervision/Assistance  Patient can return home with the following  A lot of help with walking and/or transfers;A little help with bathing/dressing/bathroom;Assistance with cooking/housework;Help with stairs  or ramp for entrance;Assist for transportation    Equipment Recommendations Other (comment) (TBD at next venue of care)  Recommendations for Other Services       Functional Status Assessment Patient has had a recent decline in their functional status and demonstrates the ability to make significant improvements in function in a reasonable and predictable amount of time.     Precautions / Restrictions Precautions Precautions: Fall Required Braces or Orthoses: Splint/Cast Splint/Cast: R ankle Restrictions Weight Bearing Restrictions: Yes RLE Weight Bearing: Non weight bearing      Mobility  Bed Mobility               General bed mobility comments: NT, patient seated in recliner    Transfers Overall transfer level: Needs assistance Equipment used: Rolling walker (2 wheels) Transfers: Sit to/from Stand Sit to Stand: Mod assist, Min assist           General transfer comment: Pt required extensive training on proper sequencing with sit to stand transfers with heavy multi-modal cuing for proper hand placement, RLE positioning, and increased trunk flexion during standing and sitting; good compliance with WB status throughout    Ambulation/Gait Ambulation/Gait assistance: Min assist Gait Distance (Feet): 5 Feet x 2 Assistive device: Rolling walker (2 wheels)   Gait velocity: decreased     General Gait Details: Hop-to pattern with mod to max verbal and visual cues for proper sequencing and min A for stability and walker management  Stairs            Wheelchair Mobility    Modified Rankin (Stroke Patients Only)       Balance Overall balance assessment: Needs assistance, History of Falls     Sitting balance -  Comments: Unsupported sitting not tested   Standing balance support: Bilateral upper extremity supported, During functional activity, Reliant on assistive device for balance Standing balance-Leahy Scale: Poor                                Pertinent Vitals/Pain Pain Assessment Pain Assessment: 0-10 Pain Score: 4  Pain Location: R ankle Pain Descriptors / Indicators: Sore Pain Intervention(s): Repositioned, Premedicated before session, Monitored during session    Home Living Family/patient expects to be discharged to:: Private residence Living Arrangements: Alone Available Help at Discharge: Family;Available PRN/intermittently Type of Home: House Home Access: Stairs to enter Entrance Stairs-Rails: None Entrance Stairs-Number of Steps: 1   Home Layout: One level Home Equipment: Rollator (4 wheels);Shower seat - built in;Standard Walker;BSC/3in1;Wheelchair - manual;Cane - single point Additional Comments: Pt's daughter with recent surgery and not available for physical assist at home    Prior Function Prior Level of Function : Independent/Modified Independent             Mobility Comments: Ind amb community distances without an AD prior to ankle fracture ADLs Comments: Ind with ADLs     Hand Dominance        Extremity/Trunk Assessment   Upper Extremity Assessment Upper Extremity Assessment: Defer to OT evaluation    Lower Extremity Assessment Lower Extremity Assessment: RLE deficits/detail;Generalized weakness RLE: Unable to fully assess due to immobilization       Communication   Communication: No difficulties  Cognition Arousal/Alertness: Awake/alert Behavior During Therapy: WFL for tasks assessed/performed Overall Cognitive Status: Within Functional Limits for tasks assessed                                          General Comments      Exercises Total Joint Exercises Ankle Circles/Pumps: AROM, Strengthening, Left, 5 reps, 10 reps (Toe wiggles RLE) Quad Sets: Strengthening, Both, 10 reps Gluteal Sets: Strengthening, Both, 10 reps Straight Leg Raises: Strengthening, Both, 5 reps Long Arc Quad: Strengthening, Both, 10 reps Knee Flexion: Strengthening, Both, 10  reps Bridges: Strengthening, Both, 10 reps (RLE force through bolster under R knee) Other Exercises Other Exercises: HEP education on RLE toe wiggles, LLE APs, and BLE QS and GS x 10 every 1-2 hours daily, BLE SLR x 5 2x/day   Assessment/Plan    PT Assessment Patient needs continued PT services  PT Problem List Decreased strength;Decreased activity tolerance;Decreased balance;Decreased mobility;Decreased knowledge of use of DME;Decreased knowledge of precautions;Pain       PT Treatment Interventions DME instruction;Therapeutic exercise;Gait training;Balance training;Functional mobility training;Therapeutic activities;Patient/family education;Stair training    PT Goals (Current goals can be found in the Care Plan section)  Acute Rehab PT Goals Patient Stated Goal: To get stronger and learn how to use DME safely PT Goal Formulation: With patient Time For Goal Achievement: 05/17/22 Potential to Achieve Goals: Good    Frequency BID     Co-evaluation               AM-PAC PT "6 Clicks" Mobility  Outcome Measure Help needed turning from your back to your side while in a flat bed without using bedrails?: A Little Help needed moving from lying on your back to sitting on the side of a flat bed without using bedrails?: A Little Help needed moving to and from a bed to  a chair (including a wheelchair)?: A Lot Help needed standing up from a chair using your arms (e.g., wheelchair or bedside chair)?: A Lot Help needed to walk in hospital room?: A Lot Help needed climbing 3-5 steps with a railing? : Total 6 Click Score: 13    End of Session Equipment Utilized During Treatment: Gait belt Activity Tolerance: Patient tolerated treatment well Patient left: in chair;with call bell/phone within reach Nurse Communication: Mobility status PT Visit Diagnosis: Unsteadiness on feet (R26.81);Other abnormalities of gait and mobility (R26.89);History of falling (Z91.81);Muscle weakness  (generalized) (M62.81);Pain Pain - Right/Left: Right Pain - part of body: Ankle and joints of foot    Time: 0931-1002 PT Time Calculation (min) (ACUTE ONLY): 31 min   Charges:   PT Evaluation $PT Eval Moderate Complexity: 1 Mod PT Treatments $Therapeutic Exercise: 8-22 mins $Therapeutic Activity: 8-22 mins       D. Scott Maelys Kinnick PT, DPT 05/04/22, 11:25 AM

## 2022-05-04 NOTE — Assessment & Plan Note (Signed)
S/p ORIF by podiatry. PT/OT recommending SNF. -Continue with pain management

## 2022-05-04 NOTE — Assessment & Plan Note (Deleted)
S/p ORIF by podiatry. PT/OT recommending SNF. -Continue with pain management 

## 2022-05-04 NOTE — Progress Notes (Signed)
PODIATRY / FOOT AND ANKLE SURGERY PROGRESS NOTE  Chief Complaint: Right ankle fracture   HPI: Lisa Galvan is a 62 y.o. female who presents s/p 1 day right ankle fracture ORIF.  Patient states her pain is fairly well controlled at this time.  She has been taking pain medication regularly.  She denies n/v/f/c.  She has worked with PT/OT and they rec SNF/rehab.  PMHx:  Past Medical History:  Diagnosis Date   Allergy    Anemia    Arthritis    spine   Bilateral leg edema    Chest pain 12/15/2014   Chronic sciatica 12/10/2014   DDD (degenerative disc disease), lumbar    Essential hypertension 12/10/2014   Gastroesophageal reflux disease without esophagitis    Mixed hyperlipidemia    Neuropathy    Obesity (BMI 30.0-34.9) 12/10/2014   Pneumonia 1968   Sleep apnea    waiting for new cpap   Type 2 diabetes mellitus without complication (HCC) 12/10/2014   Umbilical hernia     Surgical Hx:  Past Surgical History:  Procedure Laterality Date   ABDOMINAL HYSTERECTOMY     ANKLE CLOSED REDUCTION Right 04/15/2022   Procedure: CLOSED REDUCTION ANKLE;  Surgeon: Edwin Cap, DPM;  Location: [facility] ORS;  Service: Podiatry;  Laterality: Right;   COLONOSCOPY WITH PROPOFOL N/A 03/30/2019   Procedure: COLONOSCOPY WITH PROPOFOL;  Surgeon: Toney Reil, MD;  Location: [facility] ENDOSCOPY;  Service: Gastroenterology;  Laterality: N/A;   ESOPHAGOGASTRODUODENOSCOPY (EGD) WITH PROPOFOL N/A 03/12/2017   Procedure: ESOPHAGOGASTRODUODENOSCOPY (EGD) WITH PROPOFOL;  Surgeon: Toney Reil, MD;  Location: [facility] ENDOSCOPY;  Service: Gastroenterology;  Laterality: N/A;   ESOPHAGOGASTRODUODENOSCOPY (EGD) WITH PROPOFOL N/A 03/30/2019   Procedure: ESOPHAGOGASTRODUODENOSCOPY (EGD) WITH PROPOFOL;  Surgeon: Toney Reil, MD;  Location: [facility] ENDOSCOPY;  Service: Gastroenterology;  Laterality: N/A;   HERNIA REPAIR     INCISIONAL HERNIA REPAIR N/A 11/29/2020   Procedure: HERNIA REPAIR INCISIONAL, open;   Surgeon: Leafy Ro, MD;  Location: [facility] ORS;  Service: General;  Laterality: N/A;   ORIF ANKLE FRACTURE Left 07/30/2019   Procedure: OPEN REDUCTION INTERNAL FIXATION (ORIF) OF LEFT BIMALLEOLAR ANKLE FRACTURE;  Surgeon: Signa Kell, MD;  Location: [facility];  Service: Orthopedics;  Laterality: Left;  Diabetic - oral meds   ORIF ANKLE FRACTURE Right 05/03/2022   Procedure: OPEN REDUCTION INTERNAL FIXATION (ORIF) ANKLE FRACTURE - TRIMALLEOLAR WITH INTERNAL FIXATION;  Surgeon: Rosetta Posner, DPM;  Location: [facility] ORS;  Service: Podiatry;  Laterality: Right;   SUPRACERVICAL ABDOMINAL HYSTERECTOMY     UMBILICAL HERNIA REPAIR N/A 05/01/2017   Procedure: HERNIA REPAIR UMBILICAL ADULT;  Surgeon: Ancil Linsey, MD;  Location: [facility] ORS;  Service: General;  Laterality: N/A;   XI ROBOTIC ASSISTED VENTRAL HERNIA N/A 10/06/2019   Procedure: XI ROBOTIC ASSISTED VENTRAL HERNIA;  Surgeon: Leafy Ro, MD;  Location: [facility] ORS;  Service: General;  Laterality: N/A;    FHx:  Family History  Problem Relation Age of Onset   Diabetes Mother    Cancer Mother        breast and stomach   Breast cancer Mother 24   Dementia Mother    Heart disease Father    Kidney disease Father        dialysis   Cancer Father        colon   Diabetes Father    Hypertension Sister    Hypertension Brother    Hypertension Daughter    Congestive Heart Failure Maternal Grandmother  Diabetes Maternal Grandmother    Hypertension Maternal Grandmother     Social History:  reports that she has never smoked. She has never used smokeless tobacco. She reports current alcohol use of about 3.0 standard drinks of alcohol per week. She reports that she does not use drugs.  Allergies:  Allergies  Allergen Reactions   Benazepril Swelling    Face and mouth swelling.   Ivp Dye [Iodinated Contrast Media] Anaphylaxis    Patient unsure of reaction but in case of reaction d/t shellfish allergy   Lidocaine Rash     02-01-2021: Patient had lidocaine patch applied to chest at sight of injury and she experienced a rash, whelping, and swelling. Resolved after removing the patch   Lyrica [Pregabalin] Swelling    Face/ mouth swelled up balloon   Shellfish Allergy Anaphylaxis and Swelling    Betadine on skin is okay   Shellfish-Derived Products Anaphylaxis   Trulicity [Dulaglutide] Swelling    Face and mouth swelled up   Ace Inhibitors Swelling   Medications Prior to Admission  Medication Sig Dispense Refill   atorvastatin (LIPITOR) 20 MG tablet Take 1 tablet (20 mg total) by mouth daily. TAKE 1 TABLET(20 MG) BY MOUTH DAILY 90 tablet 1   DULoxetine (CYMBALTA) 60 MG capsule Take 1 capsule (60 mg total) by mouth daily. Home med.     gabapentin (NEURONTIN) 300 MG capsule TAKE 2 CAPSULES(600 MG) BY MOUTH at BEDTIME and 1 CAPSULE (300MG ) In the AM and PM (Patient taking differently: Take 300 mg by mouth 3 (three) times daily. Takes 1 cap. In am; 1 in afternoon and 2 cap. At bedtime) 360 capsule 1   metoprolol tartrate (LOPRESSOR) 25 MG tablet Take 1 tablet (25 mg total) by mouth 2 (two) times daily. 60 tablet 2   omeprazole (PRILOSEC) 40 MG capsule TAKE 1 CAPSULE(40 MG) BY MOUTH BID 180 capsule 1   sucralfate (CARAFATE) 1 g tablet Take 1 tablet (1 g total) by mouth 4 (four) times daily as needed. Home med. 360 tablet 1   vitamin B-12 (CYANOCOBALAMIN) 1000 MCG tablet Take 1,000 mcg by mouth daily.     Accu-Chek Softclix Lancets lancets TEST TWICE DAILY 100 each 12   aspirin EC 81 MG tablet Take 81 mg by mouth daily. Swallow whole.     Cholecalciferol (VITAMIN D-3) 5000 units TABS Take 5,000 Units by mouth daily.     enoxaparin (LOVENOX) 40 MG/0.4ML injection Inject 0.4 mLs (40 mg total) into the skin daily for 14 days. 5.6 mL 0   EPINEPHrine 0.3 mg/0.3 mL IJ SOAJ injection Inject 0.3 mg into the muscle as needed for anaphylaxis. 1 each 12   fexofenadine (ALLERGY RELIEF) 180 MG tablet Take 1 tablet (180 mg total) by  mouth daily as needed. Home med. (Patient taking differently: Take 180 mg by mouth daily. Home med.)     magnesium gluconate (MAGONATE) 500 MG tablet Take 500 mg by mouth 2 (two) times daily.     metFORMIN (GLUCOPHAGE) 500 MG tablet Take 2 tablets (1,000 mg total) by mouth 2 (two) times daily with a meal. 360 tablet 1   potassium chloride (KLOR-CON) 10 MEQ tablet Take 10 mEq by mouth 3 (three) times daily.      Physical Exam: General: Alert and oriented.  No apparent distress. Splint dressing CDI since yesterday.  No strikethrough or drainage noted, patient able to feel toes with light touch and able to DF and PF toes right foot.  CFT intact to digits  right.  Results for orders placed or performed during the hospital encounter of 05/03/22 (from the past 48 hour(s))  Glucose, capillary     Status: None   Collection Time: 05/03/22 11:55 AM  Result Value Ref Range   Glucose-Capillary 91 70 - 99 mg/dL    Comment: Glucose reference range applies only to samples taken after fasting for at least 8 hours.  I-STAT, chem 8     Status: Abnormal   Collection Time: 05/03/22 12:31 PM  Result Value Ref Range   Sodium 137 135 - 145 mmol/L   Potassium 3.4 (L) 3.5 - 5.1 mmol/L   Chloride 98 98 - 111 mmol/L   BUN <3 (L) 8 - 23 mg/dL   Creatinine, Ser 0.60 0.44 - 1.00 mg/dL   Glucose, Bld 89 70 - 99 mg/dL    Comment: Glucose reference range applies only to samples taken after fasting for at least 8 hours.   Calcium, Ion 1.17 1.15 - 1.40 mmol/L   TCO2 28 22 - 32 mmol/L   Hemoglobin 12.2 12.0 - 15.0 g/dL   HCT 36.0 36.0 - 46.0 %  Glucose, capillary     Status: Abnormal   Collection Time: 05/03/22  4:20 PM  Result Value Ref Range   Glucose-Capillary 125 (H) 70 - 99 mg/dL    Comment: Glucose reference range applies only to samples taken after fasting for at least 8 hours.  Magnesium     Status: Abnormal   Collection Time: 05/03/22  5:36 PM  Result Value Ref Range   Magnesium 1.3 (L) 1.7 - 2.4 mg/dL     Comment: Performed at [facility], [address] [place], [place] [address]  Glucose, capillary     Status: Abnormal   Collection Time: 05/03/22 10:02 PM  Result Value Ref Range   Glucose-Capillary 219 (H) 70 - 99 mg/dL    Comment: Glucose reference range applies only to samples taken after fasting for at least 8 hours.  Basic metabolic panel     Status: Abnormal   Collection Time: 05/04/22  5:10 AM  Result Value Ref Range   Sodium 135 135 - 145 mmol/L   Potassium 4.2 3.5 - 5.1 mmol/L   Chloride 102 98 - 111 mmol/L   CO2 23 22 - 32 mmol/L   Glucose, Bld 248 (H) 70 - 99 mg/dL    Comment: Glucose reference range applies only to samples taken after fasting for at least 8 hours.   BUN 7 (L) 8 - 23 mg/dL   Creatinine, Ser 0.90 0.44 - 1.00 mg/dL   Calcium 8.4 (L) 8.9 - 10.3 mg/dL   GFR, Estimated >60 >60 mL/min    Comment: (NOTE) Calculated using the CKD-EPI Creatinine Equation (2021)    Anion gap 10 5 - 15    Comment: Performed at [facility], [address] [place], [place] [address]  CBC     Status: Abnormal   Collection Time: 05/04/22  5:10 AM  Result Value Ref Range   WBC 5.6 4.0 - 10.5 K/uL   RBC 4.05 3.87 - 5.11 MIL/uL   Hemoglobin 11.3 (L) 12.0 - 15.0 g/dL   HCT 34.3 (L) 36.0 - 46.0 %   MCV 84.7 80.0 - 100.0 fL   MCH 27.9 26.0 - 34.0 pg   MCHC 32.9 30.0 - 36.0 g/dL   RDW 17.2 (H) 11.5 - 15.5 %   Platelets 411 (H) 150 - 400 K/uL   nRBC 0.0 0.0 - 0.2 %  Comment: Performed at [facility], [address] [place], [place] [address]  Magnesium     Status: Abnormal   Collection Time: 05/04/22  5:10 AM  Result Value Ref Range   Magnesium 1.3 (L) 1.7 - 2.4 mg/dL    Comment: Performed at [facility], [address] [place], [place] [address]  Glucose, capillary     Status: Abnormal   Collection Time: 05/04/22  7:53 AM  Result Value Ref Range   Glucose-Capillary 205 (H) 70 - 99 mg/dL    Comment: Glucose reference range applies  only to samples taken after fasting for at least 8 hours.  Glucose, capillary     Status: Abnormal   Collection Time: 05/04/22 12:10 PM  Result Value Ref Range   Glucose-Capillary 150 (H) 70 - 99 mg/dL    Comment: Glucose reference range applies only to samples taken after fasting for at least 8 hours.   DG Ankle Complete Right  Result Date: 05/03/2022 CLINICAL DATA:  Postop EXAM: RIGHT ANKLE - COMPLETE 3+ VIEW COMPARISON:  04/14/2022 FINDINGS: Interval lateral surgical plate and fixating screws at the distal fibula. This bridges distal fibular fracture. Interval screw fixation of medial malleolar fracture. There is near anatomic alignment. Mildly displaced posterior malleolar fracture. Reduction of previously noted lateral talar subluxation. Small amount of gas in the soft tissues consistent with recent surgery IMPRESSION: Interval surgical fixation of the distal tibia and fibular for trimalleolar fracture with expected postsurgical changes. Electronically Signed   By: Jasmine Pang M.D.   On: 05/03/2022 17:16   DG Ankle 2 Views Right  Result Date: 05/03/2022 CLINICAL DATA:  Right ankle ORIF EXAM: RIGHT ANKLE - 2 VIEW COMPARISON:  04/14/2022 FINDINGS: Six C-arm fluoroscopic images were obtained intraoperatively and submitted for post operative interpretation. Images obtained during right ankle ORIF with sideplate and screw fixation construct of the distal fibula and screw fixation of the medial malleolus. 32 seconds fluoroscopy time utilized. Radiation dose: Not provided . Please see the performing provider's procedural report for further detail. IMPRESSION: Intraoperative fluoroscopic guidance for right ankle ORIF. Electronically Signed   By: Duanne Guess D.O.   On: 05/03/2022 15:44   DG C-Arm 1-60 Min-No Report  Result Date: 05/03/2022 Fluoroscopy was utilized by the requesting physician.  No radiographic interpretation.   DG C-Arm 1-60 Min-No Report  Result Date: 05/03/2022 Fluoroscopy  was utilized by the requesting physician.  No radiographic interpretation.   [place] OR NERVE BLOCK-IMAGE ONLY [facility])  Result Date: 05/03/2022 There is no interpretation for this exam.  This order is for images obtained during a surgical procedure.  Please See "Surgeries" Tab for more information regarding the procedure.    Blood pressure (!) 155/92, pulse 77, temperature 98.3 F (36.8 C), resp. rate 17, height 5\' 3"  (1.6 m), weight 68.5 kg, SpO2 99 %.  Assessment Right trimal ankle fracture s/p ORIF  DM2, controlled  Plan -Patient seen and examined. -X-ray imaging reviewed and discussed with patient in detail. -Patient to keep dressing CDI until seen in outpatient clinic in hopefully around 1 week from today. -Continue NWB at all times to the RLE.  Appreciate PT/OT recs.  Rec for SNF/rehab. -May restart lovenox at any point.  Continue until ambulatory, likely 4-6 weeks. -Begin Augmentin 7d course. -Continue RICE therapy with use of tylenol/pain medication as needed.  Podiatry team to follow peripherally at this time.  May be discharged after SNF/rehab is set up.  , DPM 05/04/2022, 12:28 PM

## 2022-05-04 NOTE — Assessment & Plan Note (Signed)
CBG elevated, recent A1c of 7.2.  Patient was on metformin at home. -Add Semglee -Add mealtime coverage -Continue with SSI

## 2022-05-04 NOTE — Assessment & Plan Note (Signed)
-  Continue with home metoprolol -As needed hydralazine

## 2022-05-04 NOTE — TOC Progression Note (Signed)
Transition of Care [facility]) - Progression Note    Patient Details  Name: Lisa Galvan MRN: [id number] Date of Birth: [birth year]  Transition of Care [facility] 2) CM/SW [place], RN Phone Number: 05/04/2022, 10:32 AM  Clinical Narrative:    Met with the patient she tells me that she checked with her insurance prior to surgery and they do cover STR, She had her daughter tour [facility] practitioner and would like to go there is they have a bed, she is open to other options as well if needd   Expected Discharge Plan: [place] Barriers to Discharge: SNF Pending bed offer, Insurance Authorization  Expected Discharge Plan and Services                                               Social Determinants of Health (SDOH) Interventions SDOH Screenings   Food Insecurity: No Food Insecurity (05/04/2022)  Housing: Low Risk  (05/04/2022)  Transportation Needs: No Transportation Needs (05/04/2022)  Utilities: Not At Risk (05/04/2022)  Alcohol Screen: Low Risk  (12/21/2021)  Depression (PHQ2-9): Low Risk  (04/20/2022)  Financial Resource Strain: High Risk (01/02/2022)  Physical Activity: Inactive (12/14/2020)  Social Connections: Moderately Isolated (12/21/2021)  Stress: Stress Concern Present (12/21/2021)  Tobacco Use: Low Risk  (05/03/2022)    Readmission Risk Interventions     No data to display

## 2022-05-04 NOTE — Assessment & Plan Note (Signed)
-  Continue home Cymbalta 

## 2022-05-04 NOTE — Assessment & Plan Note (Signed)
-  Continue Lipitor °
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
## 2022-05-04 NOTE — Assessment & Plan Note (Signed)
Not using CPAP currently

## 2022-05-04 NOTE — Patient Outreach (Signed)
  Care Coordination   Follow Up Visit Note   05/04/2022 Name: Lisa Galvan MRN: [id number] DOB: Jul 09, 1960  Lisa Galvan is a 62 y.o. year old female who sees [name], DO for primary care. I spoke with  [name] by phone today.  What matters to the patients health and wellness today?  Had ankle surgery yesterday, remains hospitalized.     Goals Addressed             This Visit's Progress    RNCM: Fall prevention and Safety   On track    Care Coordination Interventions: Provided written and verbal education re: potential causes of falls and Fall prevention strategies.  Using DME to decrease risk of falling Reviewed medications and discussed potential side effects of medications such as dizziness and frequent urination Advised patient of importance of notifying provider of falls Assessed for falls since last encounter. Last fall ended in broken ankle Assessed patients knowledge of fall risk prevention secondary to previously provided education. Review with patient to monitor for risk factors and fall risk.  Provided patient information for fall alert systems. Review and patient has a life alert system. She has not set it up yet as she just received. Encouraged her to set up the system, especially since she lives alone. Assessed working status of life alert bracelet and patient adherence Advised patient to discuss fall prevention and safety concerns with provider Patient interviewed about adult health maintenance status including  Falls risk assessment    Discussed potential discharge plan to SNF for short term rehab.  She is working with inpatient TOC team to plan disposition Advised to contact this care manager when she is discharge back home         SDOH assessments and interventions completed:  No     Care Coordination Interventions:  Yes, provided   Follow up plan:  pending disposition from hospital    Encounter Outcome:  Pt. Visit Completed   Valente David,  RN, MSN, [facility] Care Management Care Management Coordinator [phone]

## 2022-05-05 DIAGNOSIS — Z79899 Other long term (current) drug therapy: Secondary | ICD-10-CM | POA: Diagnosis not present

## 2022-05-05 DIAGNOSIS — X58XXXA Exposure to other specified factors, initial encounter: Secondary | ICD-10-CM | POA: Diagnosis not present

## 2022-05-05 DIAGNOSIS — S82851A Displaced trimalleolar fracture of right lower leg, initial encounter for closed fracture: Secondary | ICD-10-CM | POA: Diagnosis not present

## 2022-05-05 DIAGNOSIS — S82431A Displaced oblique fracture of shaft of right fibula, initial encounter for closed fracture: Secondary | ICD-10-CM | POA: Diagnosis not present

## 2022-05-05 DIAGNOSIS — E785 Hyperlipidemia, unspecified: Secondary | ICD-10-CM | POA: Diagnosis not present

## 2022-05-05 DIAGNOSIS — E1140 Type 2 diabetes mellitus with diabetic neuropathy, unspecified: Secondary | ICD-10-CM | POA: Diagnosis not present

## 2022-05-05 DIAGNOSIS — E876 Hypokalemia: Secondary | ICD-10-CM | POA: Diagnosis not present

## 2022-05-05 DIAGNOSIS — Z7982 Long term (current) use of aspirin: Secondary | ICD-10-CM | POA: Diagnosis not present

## 2022-05-05 DIAGNOSIS — Z7984 Long term (current) use of oral hypoglycemic drugs: Secondary | ICD-10-CM | POA: Diagnosis not present

## 2022-05-05 DIAGNOSIS — F32A Depression, unspecified: Secondary | ICD-10-CM | POA: Diagnosis not present

## 2022-05-05 DIAGNOSIS — I10 Essential (primary) hypertension: Secondary | ICD-10-CM | POA: Diagnosis not present

## 2022-05-05 LAB — BASIC METABOLIC PANEL
Anion gap: 5 (ref 5–15)
BUN: 6 mg/dL — ABNORMAL LOW (ref 8–23)
CO2: 27 mmol/L (ref 22–32)
Calcium: 8.7 mg/dL — ABNORMAL LOW (ref 8.9–10.3)
Chloride: 104 mmol/L (ref 98–111)
Creatinine, Ser: 0.79 mg/dL (ref 0.44–1.00)
GFR, Estimated: >60 mL/min (ref >60)
Glucose, Bld: 123 mg/dL — ABNORMAL HIGH (ref 70–99)
Potassium: 3.6 mmol/L (ref 3.5–5.1)
Sodium: 136 mmol/L (ref 135–145)

## 2022-05-05 LAB — GLUCOSE, CAPILLARY
Glucose-Capillary: 110 mg/dL — ABNORMAL HIGH (ref 70–99)
Glucose-Capillary: 112 mg/dL — ABNORMAL HIGH (ref 70–99)
Glucose-Capillary: 112 mg/dL — ABNORMAL HIGH (ref 70–99)
Glucose-Capillary: 119 mg/dL — ABNORMAL HIGH (ref 70–99)
Glucose-Capillary: 72 mg/dL (ref 70–99)

## 2022-05-05 LAB — MAGNESIUM: Magnesium: 2.1 mg/dL (ref 1.7–2.4)

## 2022-05-05 NOTE — Progress Notes (Signed)
  Progress Note   Patient: Lisa Galvan GBT:[id number] DOB: 05-Feb-1961 DOA: 05/03/2022     1 DOS: the patient was seen and examined on 05/05/2022   Brief hospital course: Taken from H&P.   Lisa Galvan is a 62 y.o. female with medical history significant of hypertension, hyperlipidemia, diabetes mellitus, GERD, depression, chronic pain syndrome, OSA not on CPAP, who presents with right ankle pain, apparently patient injured her right ankle 2 weeks ago and was found to have closed displaced trimalleolar fracture of right ankle. Pt underwent rIGHT TRIMALLEOLAR ANKLE FRACTURE ORIF by Dr. Luana Shu on the day of admission.  Data reviewed independently and ED Course: pt was found to have hemoglobin 12.2, renal function okay, potassium 3.4, temperature normal, blood pressure 170/64, heart rate 75, oxygen saturation 98% on room air.   Patient was admitted for further PT/OT evaluation-possible placement and pain control.  1/12: Mildly elevated blood pressure at 155/92.  CBG elevated at 205, CBC with slight decrease of hemoglobin to 11.3 and mild reactive thrombocytosis at 411.  Adding Semglee and mealtime coverage along with SSI.  PT/OT are recommending SNF.  TOC is aware and working on it.  1/13: Hemodynamically stable.  Awaiting SNF placement  Assessment and Plan: * Closed displaced trimalleolar fracture of right ankle S/p ORIF by podiatry. PT/OT recommending SNF. -Continue with pain management  Depression -Continue home Cymbalta  Hypokalemia Hypomagnesemia.  Potassium improved with repletion.  Magnesium at 1.3. -Replete magnesium -Continue to monitor  OSA (obstructive sleep apnea) Not using CPAP currently  Essential hypertension -Continue with home metoprolol -As needed hydralazine  Hyperlipidemia -Continue Lipitor  Type 2 diabetes mellitus with diabetic neuropathy, unspecified (HCC) CBG within goal with 1 hypoglycemic episode with CBG at 58 last night, recent A1c of 7.2.  Patient was  on metformin at home. -Hold Semglee -Continue mealtime coverage-if eating more than 50% of meal. -Continue with SSI    Subjective: Patient was seen and examined today.  Pain seems improving and able to work with PT.  Physical Exam: Vitals:   05/04/22 1555 05/04/22 2246 05/05/22 0035 05/05/22 0859  BP: 109/72 130/81 112/71 94/61  Pulse: 77 80 73 62  Resp: 17 18 20 18   Temp: 98.1 F (36.7 C) 98.1 F (36.7 C) 98 F (36.7 C) 98.2 F (36.8 C)  TempSrc:  Oral  Oral  SpO2: 97% 98% 94% 98%  Weight:      Height:       General.  Well-developed lady, in no acute distress. Pulmonary.  Lungs clear bilaterally, normal respiratory effort. CV.  Regular rate and rhythm, no JVD, rub or murmur. Abdomen.  Soft, nontender, nondistended, BS positive. CNS.  Alert and oriented .  No focal neurologic deficit. Extremities.  No edema, no cyanosis, pulses intact and symmetrical. Psychiatry.  Judgment and insight appears normal.   Data Reviewed: Prior data reviewed  Family Communication: Discussed with patient  Disposition: Status is: Observation The patient will require care spanning > 2 midnights and should be moved to inpatient because: Severity of illness  Planned Discharge Destination: Skilled nursing facility  DVT prophylaxis.  Lovenox Time spent: 39 minutes  This record has been created using Systems analyst. Errors have been sought and corrected,but may not always be located. Such creation errors do not reflect on the standard of care.   Author: Lorella Nimrod, MD 05/05/2022 2:39 PM  For on call review [email or web address].

## 2022-05-05 NOTE — Assessment & Plan Note (Signed)
S/p ORIF by podiatry. PT/OT recommending SNF. -Continue with pain management 

## 2022-05-05 NOTE — Progress Notes (Signed)
Physical Therapy Treatment Patient Details Name: Lisa Galvan MRN: [id number] DOB: 1961/02/17 Today's Date: 05/05/2022   History of Present Illness Pt is a 62 y.o. female with medical history significant of hypertension, hyperlipidemia, diabetes mellitus, GERD, depression, chronic pain syndrome, OSA not on CPAP, who presents for surgical repair of right ankle fracture.  Pt diagnosed with right trimalleolar ankle fracture and is s/p ORIF.    PT Comments    Pt progressed with bed mobility performing at Mod I level.  Pt progressing with transfers and gait with RW performing at min guard level with min cues for technique and able to maintain RLE NWB status.  Current PT d/c recommendation is still appropriate.   Recommendations for follow up therapy are one component of a multi-disciplinary discharge planning process, led by the attending physician.  Recommendations may be updated based on patient status, additional functional criteria and insurance authorization.  Follow Up Recommendations  Skilled nursing-short term rehab (<3 hours/day) Can patient physically be transported by private vehicle: No   Assistance Recommended at Discharge Frequent or constant Supervision/Assistance  Patient can return home with the following A little help with bathing/dressing/bathroom;Assistance with cooking/housework;Help with stairs or ramp for entrance;Assist for transportation;A little help with walking and/or transfers   Equipment Recommendations  Other (comment) (TBD at next venue of care)    Recommendations for Other Services       Precautions / Restrictions Precautions Precautions: Fall Required Braces or Orthoses: Splint/Cast Splint/Cast: R ankle Splint/Cast - Date Prophylactic Dressing Applied (if applicable): 16/01/09 Restrictions Weight Bearing Restrictions: Yes RLE Weight Bearing: Non weight bearing     Mobility  Bed Mobility Overal bed mobility: Needs Assistance Bed Mobility: Sit to  Supine     Supine to sit: Modified independent (Device/Increase time)          Transfers Overall transfer level: Needs assistance Equipment used: Rolling walker (2 wheels) Transfers: Sit to/from Stand Sit to Stand: Min guard   Step pivot transfers: Min guard       General transfer comment: Min cues for handplacement and body mechanics; good compliance with WB status throughout with no cues needed for RLE positioning    Ambulation/Gait Ambulation/Gait assistance: Min guard Gait Distance (Feet): 30 Feet Assistive device: Rolling walker (2 wheels) Gait Pattern/deviations: Step-to pattern Gait velocity: decreased     General Gait Details: Hop-to pattern with min verbal and visual cues for proper sequencing and minguard  for stability.   Stairs             Wheelchair Mobility    Modified Rankin (Stroke Patients Only)       Balance Overall balance assessment: Modified Independent Sitting-balance support: Feet supported Sitting balance-Leahy Scale: Good Sitting balance - Comments: Unsupported sitting not tested   Standing balance support: Bilateral upper extremity supported, During functional activity, Reliant on assistive device for balance Standing balance-Leahy Scale: Fair Standing balance comment: no imbalance in standing with RW noted this session.                            Cognition Arousal/Alertness: Awake/alert Behavior During Therapy: WFL for tasks assessed/performed Overall Cognitive Status: Within Functional Limits for tasks assessed                                 General Comments: Willing to follow instructions this session.        Exercises Total  Joint Exercises Ankle Circles/Pumps: 15 reps, Left, Strengthening, AAROM Quad Sets: Strengthening, AAROM, Both, 15 reps    General Comments        Pertinent Vitals/Pain Pain Assessment Pain Assessment: 0-10 Pain Score: 3  Pain Location: R ankle Pain  Descriptors / Indicators: Sore    Home Living                          Prior Function            PT Goals (current goals can now be found in the care plan section) Acute Rehab PT Goals Patient Stated Goal: To get stronger and learn how to use DME safely PT Goal Formulation: With patient Time For Goal Achievement: 05/17/22 Potential to Achieve Goals: Good Progress towards PT goals: Progressing toward goals    Frequency    BID      PT Plan Current plan remains appropriate    Co-evaluation              AM-PAC PT "6 Clicks" Mobility   Outcome Measure  Help needed turning from your back to your side while in a flat bed without using bedrails?: None Help needed moving from lying on your back to sitting on the side of a flat bed without using bedrails?: None Help needed moving to and from a bed to a chair (including a wheelchair)?: A Little Help needed standing up from a chair using your arms (e.g., wheelchair or bedside chair)?: A Little Help needed to walk in hospital room?: A Little Help needed climbing 3-5 steps with a railing? : A Lot 6 Click Score: 19    End of Session Equipment Utilized During Treatment: Gait belt Activity Tolerance: Patient tolerated treatment well Patient left: with call bell/phone within reach;with SCD's reapplied Nurse Communication: Mobility status PT Visit Diagnosis: Unsteadiness on feet (R26.81);Other abnormalities of gait and mobility (R26.89);History of falling (Z91.81);Muscle weakness (generalized) (M62.81);Pain Pain - Right/Left: Right Pain - part of body: Ankle and joints of foot     Time: 8469-6295 PT Time Calculation (min) (ACUTE ONLY): 25 min  Charges:  $Gait Training: 8-22 mins $Therapeutic Activity: 8-22 mins                     Bjorn Loser, PTA  05/05/22, 12:33 PM

## 2022-05-05 NOTE — Progress Notes (Signed)
Physical Therapy Treatment Patient Details Name: Lisa Galvan MRN: [id number] DOB: [birth year] Today's Date: 05/05/2022   History of Present Illness Pt is a 62 y.o. female with medical history significant of hypertension, hyperlipidemia, diabetes mellitus, GERD, depression, chronic pain syndrome, OSA not on CPAP, who presents for surgical repair of right ankle fracture.  Pt diagnosed with right trimalleolar ankle fracture and is s/p ORIF.    PT Comments    Pt progressed with functional standing balance for bathroom activity/clothes managment and washing hands, min guard and min cues for safety. Pt continues to need min guard for occasional unsteadiness with RW when hop -stepping. Current PT d/c plan is appropriate.  Recommendations for follow up therapy are one component of a multi-disciplinary discharge planning process, led by the attending physician.  Recommendations may be updated based on patient status, additional functional criteria and insurance authorization.  Follow Up Recommendations  Skilled nursing-short term rehab (<3 hours/day) Can patient physically be transported by private vehicle: Yes   Assistance Recommended at Discharge Intermittent Supervision/Assistance  Patient can return home with the following A little help with bathing/dressing/bathroom;Assistance with cooking/housework;Help with stairs or ramp for entrance;Assist for transportation;A little help with walking and/or transfers   Equipment Recommendations  None recommended by PT    Recommendations for Other Services       Precautions / Restrictions Precautions Precautions: Fall Required Braces or Orthoses: Splint/Cast Splint/Cast: R ankle Splint/Cast - Date Prophylactic Dressing Applied (if applicable): 21/30/86 Restrictions Weight Bearing Restrictions: Yes RLE Weight Bearing: Non weight bearing     Mobility  Bed Mobility Overal bed mobility: Needs Assistance Bed Mobility: Sit to Supine     Supine to  sit: Modified independent (Device/Increase time) Sit to supine: Modified independent (Device/Increase time)        Transfers Overall transfer level: Needs assistance Equipment used: Rolling walker (2 wheels) Transfers: Sit to/from Stand Sit to Stand: Min guard   Step pivot transfers: Min guard       General transfer comment: Min cues for handplacement and body mechanics; good compliance with WB status throughout with no cues needed for RLE positioning    Ambulation/Gait Ambulation/Gait assistance: Min guard Gait Distance (Feet): 30 Feet Assistive device: Rolling walker (2 wheels) Gait Pattern/deviations: Step-to pattern Gait velocity: decreased     General Gait Details: Hop-to pattern with min verbal and visual cues for proper sequencing and minguard  for stability.   Stairs             Wheelchair Mobility    Modified Rankin (Stroke Patients Only)       Balance Overall balance assessment: Modified Independent Sitting-balance support: Feet unsupported Sitting balance-Leahy Scale: Good Sitting balance - Comments: good unsupported sitting.   Standing balance support: During functional activity, Reliant on assistive device for balance, Single extremity supported Standing balance-Leahy Scale: Fair Standing balance comment: Functional standing for bathroom activity/clothes managment and washing hands, min guard and min cues for safety.                            Cognition Arousal/Alertness: Awake/alert Behavior During Therapy: WFL for tasks assessed/performed Overall Cognitive Status: Within Functional Limits for tasks assessed                                 General Comments: Willing to follow instructions this session.        Exercises Total Joint  Exercises Ankle Circles/Pumps: 15 reps, Left, Strengthening, AAROM Quad Sets: Strengthening, AAROM, Both, 15 reps    General Comments        Pertinent Vitals/Pain Pain  Assessment Pain Assessment: Faces Pain Score: 3  Faces Pain Scale: Hurts a little bit Pain Location: R ankle Pain Descriptors / Indicators: Sore Pain Intervention(s): Monitored during session    Home Living                          Prior Function            PT Goals (current goals can now be found in the care plan section) Acute Rehab PT Goals Patient Stated Goal: To get stronger and learn how to use DME safely PT Goal Formulation: With patient Time For Goal Achievement: 05/17/22 Potential to Achieve Goals: Good Progress towards PT goals: Progressing toward goals    Frequency    BID      PT Plan Current plan remains appropriate    Co-evaluation              AM-PAC PT "6 Clicks" Mobility   Outcome Measure  Help needed turning from your back to your side while in a flat bed without using bedrails?: None Help needed moving from lying on your back to sitting on the side of a flat bed without using bedrails?: None Help needed moving to and from a bed to a chair (including a wheelchair)?: A Little Help needed standing up from a chair using your arms (e.g., wheelchair or bedside chair)?: A Little Help needed to walk in hospital room?: A Little Help needed climbing 3-5 steps with a railing? : A Lot 6 Click Score: 19    End of Session Equipment Utilized During Treatment: Gait belt Activity Tolerance: Patient tolerated treatment well Patient left: with call bell/phone within reach;with SCD's reapplied;in bed;with bed alarm set Nurse Communication: Mobility status PT Visit Diagnosis: Unsteadiness on feet (R26.81);Other abnormalities of gait and mobility (R26.89);History of falling (Z91.81);Muscle weakness (generalized) (M62.81);Pain Pain - Right/Left: Right Pain - part of body: Ankle and joints of foot     Time: 6063-0160 PT Time Calculation (min) (ACUTE ONLY): 19 min  Charges:  $Gait Training: 8-22 mins $Therapeutic Activity: 8-22 mins                      Bjorn Loser, PTA  05/05/22, 3:48 PM

## 2022-05-05 NOTE — Assessment & Plan Note (Signed)
CBG within goal with 1 hypoglycemic episode with CBG at 58 last night, recent A1c of 7.2.  Patient was on metformin at home. -Hold Semglee -Continue mealtime coverage-if eating more than 50% of meal. -Continue with SSI

## 2022-05-06 DIAGNOSIS — I10 Essential (primary) hypertension: Secondary | ICD-10-CM | POA: Diagnosis not present

## 2022-05-06 DIAGNOSIS — F32A Depression, unspecified: Secondary | ICD-10-CM | POA: Diagnosis not present

## 2022-05-06 DIAGNOSIS — Z7982 Long term (current) use of aspirin: Secondary | ICD-10-CM | POA: Diagnosis not present

## 2022-05-06 DIAGNOSIS — E1140 Type 2 diabetes mellitus with diabetic neuropathy, unspecified: Secondary | ICD-10-CM | POA: Diagnosis not present

## 2022-05-06 DIAGNOSIS — Z7984 Long term (current) use of oral hypoglycemic drugs: Secondary | ICD-10-CM | POA: Diagnosis not present

## 2022-05-06 DIAGNOSIS — E785 Hyperlipidemia, unspecified: Secondary | ICD-10-CM | POA: Diagnosis not present

## 2022-05-06 DIAGNOSIS — E876 Hypokalemia: Secondary | ICD-10-CM | POA: Diagnosis not present

## 2022-05-06 DIAGNOSIS — X58XXXA Exposure to other specified factors, initial encounter: Secondary | ICD-10-CM | POA: Diagnosis not present

## 2022-05-06 DIAGNOSIS — S82431A Displaced oblique fracture of shaft of right fibula, initial encounter for closed fracture: Secondary | ICD-10-CM | POA: Diagnosis not present

## 2022-05-06 DIAGNOSIS — Z79899 Other long term (current) drug therapy: Secondary | ICD-10-CM | POA: Diagnosis not present

## 2022-05-06 DIAGNOSIS — S82851A Displaced trimalleolar fracture of right lower leg, initial encounter for closed fracture: Secondary | ICD-10-CM | POA: Diagnosis not present

## 2022-05-06 LAB — GLUCOSE, CAPILLARY
Glucose-Capillary: 103 mg/dL — ABNORMAL HIGH (ref 70–99)
Glucose-Capillary: 79 mg/dL (ref 70–99)
Glucose-Capillary: 143 mg/dL — ABNORMAL HIGH (ref 70–99)
Glucose-Capillary: 71 mg/dL (ref 70–99)

## 2022-05-06 NOTE — Progress Notes (Signed)
  Progress Note   Patient: Lisa Galvan EUM:[id number] DOB: 28-Jun-1960 DOA: 05/03/2022     1 DOS: the patient was seen and examined on 05/06/2022   Brief hospital course: Taken from H&P.   Lisa Galvan is a 62 y.o. female with medical history significant of hypertension, hyperlipidemia, diabetes mellitus, GERD, depression, chronic pain syndrome, OSA not on CPAP, who presents with right ankle pain, apparently patient injured her right ankle 2 weeks ago and was found to have closed displaced trimalleolar fracture of right ankle. Pt underwent rIGHT TRIMALLEOLAR ANKLE FRACTURE ORIF by Dr. Luana Shu on the day of admission.  Data reviewed independently and ED Course: pt was found to have hemoglobin 12.2, renal function okay, potassium 3.4, temperature normal, blood pressure 170/64, heart rate 75, oxygen saturation 98% on room air.   Patient was admitted for further PT/OT evaluation-possible placement and pain control.  1/12: Mildly elevated blood pressure at 155/92.  CBG elevated at 205, CBC with slight decrease of hemoglobin to 11.3 and mild reactive thrombocytosis at 411.  Adding Semglee and mealtime coverage along with SSI.  PT/OT are recommending SNF.  TOC is aware and working on it.  1/13: Hemodynamically stable.  Awaiting SNF placement  1/14: No new concern.  Awaiting SNF placement, medically stable  Assessment and Plan: * Closed displaced trimalleolar fracture of right ankle S/p ORIF by podiatry. PT/OT recommending SNF. -Continue with pain management  Depression -Continue home Cymbalta  Hypokalemia Hypomagnesemia.  Potassium improved with repletion.  Magnesium at 1.3. -Replete magnesium -Continue to monitor  OSA (obstructive sleep apnea) Not using CPAP currently  Essential hypertension -Continue with home metoprolol -As needed hydralazine  Hyperlipidemia -Continue Lipitor  Type 2 diabetes mellitus with diabetic neuropathy, unspecified (HCC) CBG within goal with 1 hypoglycemic  episode with CBG at 58 last night, recent A1c of 7.2.  Patient was on metformin at home. -Hold Semglee -Continue mealtime coverage-if eating more than 50% of meal. -Continue with SSI  Subjective: Patient was sitting in chair comfortably when seen today.  No new concern.  Physical Exam: Vitals:   05/05/22 2132 05/05/22 2245 05/06/22 0940 05/06/22 1058  BP: 115/83 126/78 (!) 86/66 121/84  Pulse: 81 63 83 66  Resp: 18 17 17 16   Temp: 98.1 F (36.7 C) 98 F (36.7 C) 98.1 F (36.7 C) 97.6 F (36.4 C)  TempSrc:    Oral  SpO2: 100% 98% 100% 98%  Weight:      Height:       General.  Well-developed lady, in no acute distress. Pulmonary.  Lungs clear bilaterally, normal respiratory effort. CV.  Regular rate and rhythm, no JVD, rub or murmur. Abdomen.  Soft, nontender, nondistended, BS positive. CNS.  Alert and oriented .  No focal neurologic deficit. Extremities.  No edema,  Right foot with Ace wrap Psychiatry.  Judgment and insight appears normal.   Data Reviewed: Prior data reviewed  Family Communication: Discussed with patient  Disposition: Status is: Observation The patient will require care spanning > 2 midnights and should be moved to inpatient because: Severity of illness  Planned Discharge Destination: Skilled nursing facility  DVT prophylaxis.  Lovenox Time spent: 38 minutes  This record has been created using Systems analyst. Errors have been sought and corrected,but may not always be located. Such creation errors do not reflect on the standard of care.   Author: Lorella Nimrod, MD 05/06/2022 1:30 PM  For on call review [email or web address].

## 2022-05-06 NOTE — TOC Progression Note (Signed)
Transition of Care [facility]) - Progression Note    Patient Details  Name: Lisa Galvan MRN: [id number] Date of Birth: [birth year]  Transition of Care [facility]) CM/SW Contact  Izola Price, RN Phone Number: 05/06/2022, 12:36 PM  Clinical Narrative:  1/14: First choice of SNF/STR if [facility] but it was declined in [place]. Other remain pending. Simmie Davies RN CM      Expected Discharge Plan: Skilled Nursing Facility Barriers to Discharge: SNF Pending bed offer, Insurance Authorization  Expected Discharge Plan and Services                                               Social Determinants of Health (SDOH) Interventions SDOH Screenings   Food Insecurity: No Food Insecurity (05/04/2022)  Housing: Low Risk  (05/04/2022)  Transportation Needs: No Transportation Needs (05/04/2022)  Utilities: Not At Risk (05/04/2022)  Alcohol Screen: Low Risk  (12/21/2021)  Depression (PHQ2-9): Low Risk  (04/20/2022)  Financial Resource Strain: High Risk (01/02/2022)  Physical Activity: Inactive (12/14/2020)  Social Connections: Moderately Isolated (12/21/2021)  Stress: Stress Concern Present (12/21/2021)  Tobacco Use: Low Risk  (05/04/2022)    Readmission Risk Interventions     No data to display

## 2022-05-06 NOTE — Progress Notes (Signed)
Physical Therapy Treatment Patient Details Name: Lisa Galvan MRN: [id number] DOB: 1961-02-09 Today's Date: 05/06/2022   History of Present Illness Pt is a 62 y.o. female with medical history significant of hypertension, hyperlipidemia, diabetes mellitus, GERD, depression, chronic pain syndrome, OSA not on CPAP, who presents for surgical repair of right ankle fracture.  Pt diagnosed with right trimalleolar ankle fracture and is s/p ORIF.    PT Comments    Pt ready for session.  No assist for bed mobility.  She is able to stand with increased time and cues.  Struggles to remember hand placements.  She is able to hop to/from bathroom with RW and min guard/  min assist at times due to balance and poor walker placement but does well maintaining NWB.  She does fatigue upon return trip and needs short seated rest on bed before continuing to recliner.  Distance to bathroom is about max she can safely do.     Recommendations for follow up therapy are one component of a multi-disciplinary discharge planning process, led by the attending physician.  Recommendations may be updated based on patient status, additional functional criteria and insurance authorization.  Follow Up Recommendations  Skilled nursing-short term rehab (<3 hours/day) Can patient physically be transported by private vehicle: Yes   Assistance Recommended at Discharge Intermittent Supervision/Assistance  Patient can return home with the following A little help with bathing/dressing/bathroom;Assistance with cooking/housework;Help with stairs or ramp for entrance;Assist for transportation;A little help with walking and/or transfers   Equipment Recommendations  None recommended by PT    Recommendations for Other Services       Precautions / Restrictions Precautions Precautions: Fall Required Braces or Orthoses: Splint/Cast Splint/Cast: R ankle Splint/Cast - Date Prophylactic Dressing Applied (if applicable):  28/78/67 Restrictions Weight Bearing Restrictions: Yes RLE Weight Bearing: Non weight bearing     Mobility  Bed Mobility Overal bed mobility: Modified Independent                  Transfers Overall transfer level: Needs assistance Equipment used: Rolling walker (2 wheels) Transfers: Sit to/from Stand Sit to Stand: Min assist                Ambulation/Gait Ambulation/Gait assistance: Min guard Gait Distance (Feet): 30 Feet Assistive device: Rolling walker (2 wheels) Gait Pattern/deviations: Step-to pattern Gait velocity: decreased     General Gait Details: Hop-to pattern with min verbal and visual cues for proper sequencing and minguard  for stability.   Stairs             Wheelchair Mobility    Modified Rankin (Stroke Patients Only)       Balance Overall balance assessment: Modified Independent Sitting-balance support: Feet unsupported Sitting balance-Leahy Scale: Good Sitting balance - Comments: good unsupported sitting.   Standing balance support: During functional activity, Reliant on assistive device for balance, Single extremity supported Standing balance-Leahy Scale: Fair Standing balance comment: Functional standing for bathroom activity/clothes managment and washing hands, min guard and min cues for safety.                            Cognition Arousal/Alertness: Awake/alert Behavior During Therapy: WFL for tasks assessed/performed Overall Cognitive Status: Within Functional Limits for tasks assessed  Exercises      General Comments        Pertinent Vitals/Pain Pain Assessment Pain Assessment: Faces Faces Pain Scale: Hurts a little bit Pain Location: R ankle Pain Descriptors / Indicators: Sore Pain Intervention(s): Limited activity within patient's tolerance, Monitored during session, Repositioned    Home Living                           Prior Function            PT Goals (current goals can now be found in the care plan section) Progress towards PT goals: Progressing toward goals    Frequency           PT Plan Current plan remains appropriate    Co-evaluation              AM-PAC PT "6 Clicks" Mobility   Outcome Measure  Help needed turning from your back to your side while in a flat bed without using bedrails?: None Help needed moving from lying on your back to sitting on the side of a flat bed without using bedrails?: None Help needed moving to and from a bed to a chair (including a wheelchair)?: A Little Help needed standing up from a chair using your arms (e.g., wheelchair or bedside chair)?: A Little Help needed to walk in hospital room?: A Little Help needed climbing 3-5 steps with a railing? : A Lot 6 Click Score: 19    End of Session Equipment Utilized During Treatment: Gait belt Activity Tolerance: Patient tolerated treatment well Patient left: with call bell/phone within reach;with SCD's reapplied;in bed;with bed alarm set   PT Visit Diagnosis: Unsteadiness on feet (R26.81);Other abnormalities of gait and mobility (R26.89);History of falling (Z91.81);Muscle weakness (generalized) (M62.81);Pain Pain - Right/Left: Right     Time: 7106-2694 PT Time Calculation (min) (ACUTE ONLY): 17 min  Charges:  $Gait Training: 8-22 mins                   Chesley Noon, PTA 05/06/22, 11:08 AM

## 2022-05-07 DIAGNOSIS — Z79899 Other long term (current) drug therapy: Secondary | ICD-10-CM | POA: Diagnosis not present

## 2022-05-07 DIAGNOSIS — I10 Essential (primary) hypertension: Secondary | ICD-10-CM | POA: Diagnosis not present

## 2022-05-07 DIAGNOSIS — F32A Depression, unspecified: Secondary | ICD-10-CM | POA: Diagnosis not present

## 2022-05-07 DIAGNOSIS — Z7982 Long term (current) use of aspirin: Secondary | ICD-10-CM | POA: Diagnosis not present

## 2022-05-07 DIAGNOSIS — E1140 Type 2 diabetes mellitus with diabetic neuropathy, unspecified: Secondary | ICD-10-CM | POA: Diagnosis not present

## 2022-05-07 DIAGNOSIS — E876 Hypokalemia: Secondary | ICD-10-CM | POA: Diagnosis not present

## 2022-05-07 DIAGNOSIS — X58XXXA Exposure to other specified factors, initial encounter: Secondary | ICD-10-CM | POA: Diagnosis not present

## 2022-05-07 DIAGNOSIS — S82431A Displaced oblique fracture of shaft of right fibula, initial encounter for closed fracture: Secondary | ICD-10-CM | POA: Diagnosis not present

## 2022-05-07 DIAGNOSIS — Z7984 Long term (current) use of oral hypoglycemic drugs: Secondary | ICD-10-CM | POA: Diagnosis not present

## 2022-05-07 DIAGNOSIS — E785 Hyperlipidemia, unspecified: Secondary | ICD-10-CM | POA: Diagnosis not present

## 2022-05-07 DIAGNOSIS — S82851A Displaced trimalleolar fracture of right lower leg, initial encounter for closed fracture: Secondary | ICD-10-CM | POA: Diagnosis not present

## 2022-05-07 LAB — GLUCOSE, CAPILLARY
Glucose-Capillary: 106 mg/dL — ABNORMAL HIGH (ref 70–99)
Glucose-Capillary: 124 mg/dL — ABNORMAL HIGH (ref 70–99)
Glucose-Capillary: 170 mg/dL — ABNORMAL HIGH (ref 70–99)
Glucose-Capillary: 112 mg/dL — ABNORMAL HIGH (ref 70–99)

## 2022-05-07 NOTE — Progress Notes (Addendum)
Progress Note   Patient: Lisa Galvan NGE:[id number] DOB: 07/27/1960 DOA: 05/03/2022     1 DOS: the patient was seen and examined on 05/07/2022   Brief hospital course: Taken from H&P.   Lisa Galvan is a 62 y.o. female with medical history significant of hypertension, hyperlipidemia, diabetes mellitus, GERD, depression, chronic pain syndrome, OSA not on CPAP, who presents with right ankle pain, apparently patient injured her right ankle 2 weeks ago and was found to have closed displaced trimalleolar fracture of right ankle. Pt underwent rIGHT TRIMALLEOLAR ANKLE FRACTURE ORIF by Dr. Luana Shu on the day of admission.  Data reviewed independently and ED Course: pt was found to have hemoglobin 12.2, renal function okay, potassium 3.4, temperature normal, blood pressure 170/64, heart rate 75, oxygen saturation 98% on room air.   Patient was admitted for further PT/OT evaluation-possible placement and pain control.  1/12: Mildly elevated blood pressure at 155/92.  CBG elevated at 205, CBC with slight decrease of hemoglobin to 11.3 and mild reactive thrombocytosis at 411.  Adding Semglee and mealtime coverage along with SSI.  PT/OT are recommending SNF.  TOC is aware and working on it.  1/13: Hemodynamically stable.  Awaiting SNF placement  1/14: No new concern.  Awaiting SNF placement, medically stable.  1/15: Remained stable, still no bed offer.  Assessment and Plan: * Closed displaced trimalleolar fracture of right ankle S/p ORIF by podiatry. PT/OT recommending SNF. -Continue with pain management  Depression -Continue home Cymbalta  Hypokalemia Hypomagnesemia.  Potassium improved with repletion.  Magnesium at 1.3. -Replete magnesium -Continue to monitor  OSA (obstructive sleep apnea) Not using CPAP currently  Essential hypertension -Continue with home metoprolol -As needed hydralazine  Hyperlipidemia -Continue Lipitor  Type 2 diabetes mellitus with diabetic neuropathy,  unspecified (HCC) CBG within goal with 1 hypoglycemic episode with CBG at 58 last night, recent A1c of 7.2.  Patient was on metformin at home. -Hold Semglee -Continue mealtime coverage-if eating more than 50% of meal. -Continue with SSI  Subjective: Patient was seen and examined today.  Having some right ankle pain.  Also complaining of some burning pain along the lateral side of lower leg.  Pulses and sensations in toes intact.  Physical Exam: Vitals:   05/06/22 2217 05/07/22 0045 05/07/22 0753 05/07/22 0835  BP: (!) 146/86 134/72 (!) 166/84 (!) 158/92  Pulse: 69 60 (!) 59 (!) 59  Resp:  18 17   Temp:  (!) 97.5 F (36.4 C) 98 F (36.7 C)   TempSrc:      SpO2:  97% 99% 98%  Weight:      Height:       General.  Well-developed lady, in no acute distress. Pulmonary.  Lungs clear bilaterally, normal respiratory effort. CV.  Regular rate and rhythm, no JVD, rub or murmur. Abdomen.  Soft, nontender, nondistended, BS positive. CNS.  Alert and oriented .  No focal neurologic deficit. Extremities.  Right foot with Ace wrap, sensations intact in toes. Psychiatry.  Judgment and insight appears normal.   Data Reviewed: Prior data reviewed  Family Communication: Discussed with patient  Disposition: Status is: Observation The patient will require care spanning > 2 midnights and should be moved to inpatient because: Severity of illness  Planned Discharge Destination: Skilled nursing facility  DVT prophylaxis.  Lovenox Time spent: 38 minutes  This record has been created using Systems analyst. Errors have been sought and corrected,but may not always be located. Such creation errors do not reflect on the standard  of care.   Author: Lorella Nimrod, MD 05/07/2022 1:30 PM  For on call review [email or web address].

## 2022-05-07 NOTE — Progress Notes (Signed)
Apt cancelled as patient just needed a form signed- not pre-op clearance.

## 2022-05-07 NOTE — Progress Notes (Signed)
PT Cancellation Note  Patient Details Name: Lisa Galvan MRN: [id number] DOB: [birth year]   Cancelled Treatment:    Reason Eval/Treat Not Completed: Fatigue/lethargy limiting ability to participate  Attempted to awaken pt for session.  Remains asleep despite verbal and light tactile cues.  Will return after lunch for second attempt.   Chesley Noon 05/07/2022, 2:18 PM

## 2022-05-07 NOTE — Progress Notes (Deleted)
Acute Office Visit   Patient: Lisa Galvan   DOB: 11-24-1960   62 y.o. Female  MRN: [id number] Visit Date: 04/27/2022  Today's healthcare provider: Dani Gobble Aleria Maheu, PA-C  Introduced myself to the patient as a Journalist, newspaper and provided education on APPs in clinical practice.    Chief Complaint  Patient presents with   Medical Clearance    Patient is here for Surgical Clearance. Patient says she was informed this morning by the surgeon to stop the blood thinner injection and Aspirin medication a day before the surgery.    Subjective    HPI HPI     Medical Clearance    Additional comments: Patient is here for Surgical Clearance. Patient says she was informed this morning by the surgeon to stop the blood thinner injection and Aspirin medication a day before the surgery.       Last edited by Irena Reichmann, CMA on 04/27/2022  1:33 PM.        Medications: No facility-administered medications prior to visit.   Outpatient Medications Prior to Visit  Medication Sig   Accu-Chek Softclix Lancets lancets TEST TWICE DAILY   aspirin EC 81 MG tablet Take 81 mg by mouth daily. Swallow whole.   atorvastatin (LIPITOR) 20 MG tablet Take 1 tablet (20 mg total) by mouth daily. TAKE 1 TABLET(20 MG) BY MOUTH DAILY   Cholecalciferol (VITAMIN D-3) 5000 units TABS Take 5,000 Units by mouth daily.   DULoxetine (CYMBALTA) 60 MG capsule Take 1 capsule (60 mg total) by mouth daily. Home med.   enoxaparin (LOVENOX) 40 MG/0.4ML injection Inject 0.4 mLs (40 mg total) into the skin daily for 14 days.   EPINEPHrine 0.3 mg/0.3 mL IJ SOAJ injection Inject 0.3 mg into the muscle as needed for anaphylaxis.   fexofenadine (ALLERGY RELIEF) 180 MG tablet Take 1 tablet (180 mg total) by mouth daily as needed. Home med. (Patient taking differently: Take 180 mg by mouth daily. Home med.)   gabapentin (NEURONTIN) 300 MG capsule TAKE 2 CAPSULES(600 MG) BY MOUTH at BEDTIME and 1 CAPSULE (300MG ) In the AM and PM (Patient  taking differently: Take 300 mg by mouth 3 (three) times daily. Takes 1 cap. In am; 1 in afternoon and 2 cap. At bedtime)   magnesium gluconate (MAGONATE) 500 MG tablet Take 500 mg by mouth 2 (two) times daily.   metFORMIN (GLUCOPHAGE) 500 MG tablet Take 2 tablets (1,000 mg total) by mouth 2 (two) times daily with a meal.   metoprolol tartrate (LOPRESSOR) 25 MG tablet Take 1 tablet (25 mg total) by mouth 2 (two) times daily.   omeprazole (PRILOSEC) 40 MG capsule TAKE 1 CAPSULE(40 MG) BY MOUTH BID   potassium chloride (KLOR-CON) 10 MEQ tablet Take 10 mEq by mouth 3 (three) times daily.   sucralfate (CARAFATE) 1 g tablet Take 1 tablet (1 g total) by mouth 4 (four) times daily as needed. Home med.   vitamin B-12 (CYANOCOBALAMIN) 1000 MCG tablet Take 1,000 mcg by mouth daily.    Review of Systems  {Labs  Heme  Chem  Endocrine  Serology  Results Review (optional):23779}   Objective    There were no vitals taken for this visit. {Show previous vital signs (optional):23777}  Physical Exam    No results found for any visits on 04/27/22.  Assessment & Plan      No follow-ups on file.

## 2022-05-07 NOTE — TOC Progression Note (Signed)
Transition of Care [facility]) - Progression Note    Patient Details  Name: Lisa Galvan MRN: [id number] Date of Birth: [birth year]  Transition of Care [facility]) CM/SW [place], RN Phone Number: 05/07/2022, 2:15 PM  Clinical Narrative:     Reached out to the only facility [facility] to ask if they would review for a bed offer, will review offers once obtained  Expected Discharge Plan: [place] Barriers to Discharge: SNF Pending bed offer, Insurance Authorization  Expected Discharge Plan and Services                                               Social Determinants of Health (SDOH) Interventions SDOH Screenings   Food Insecurity: No Food Insecurity (05/04/2022)  Housing: Low Risk  (05/04/2022)  Transportation Needs: No Transportation Needs (05/04/2022)  Utilities: Not At Risk (05/04/2022)  Alcohol Screen: Low Risk  (12/21/2021)  Depression (PHQ2-9): Low Risk  (04/20/2022)  Financial Resource Strain: High Risk (01/02/2022)  Physical Activity: Inactive (12/14/2020)  Social Connections: Moderately Isolated (12/21/2021)  Stress: Stress Concern Present (12/21/2021)  Tobacco Use: Low Risk  (05/04/2022)    Readmission Risk Interventions     No data to display

## 2022-05-07 NOTE — Progress Notes (Signed)
Bone stimulator device applied to right ankle.

## 2022-05-07 NOTE — Progress Notes (Signed)
Physical Therapy Treatment Patient Details Name: Lisa Galvan MRN: [id number] DOB: [birth year] Today's Date: 05/07/2022   History of Present Illness Pt is a 62 y.o. female with medical history significant of hypertension, hyperlipidemia, diabetes mellitus, GERD, depression, chronic pain syndrome, OSA not on CPAP, who presents for surgical repair of right ankle fracture.  Pt diagnosed with right trimalleolar ankle fracture and is s/p ORIF.    PT Comments    Pt in bathroom with tech upon arrival.  Took over care and pt is able to walk with NWB back to recliner with slow but generally steady gait.  Some LOB initially when standing with walker and needs min a to recover.  She does remain in chair after session with needs met.  Stated she is doing her HEP on her own but declined further activity at this time.  "I think I have done enough for now."   Recommendations for follow up therapy are one component of a multi-disciplinary discharge planning process, led by the attending physician.  Recommendations may be updated based on patient status, additional functional criteria and insurance authorization.  Follow Up Recommendations  Skilled nursing-short term rehab (<3 hours/day) Can patient physically be transported by private vehicle: Yes   Assistance Recommended at Discharge Intermittent Supervision/Assistance  Patient can return home with the following A little help with bathing/dressing/bathroom;Assistance with cooking/housework;Help with stairs or ramp for entrance;Assist for transportation;A little help with walking and/or transfers   Equipment Recommendations  None recommended by PT    Recommendations for Other Services       Precautions / Restrictions Precautions Precautions: Fall Required Braces or Orthoses: Splint/Cast Splint/Cast: R ankle Splint/Cast - Date Prophylactic Dressing Applied (if applicable): 57/32/20 Restrictions Weight Bearing Restrictions: Yes RLE Weight Bearing:  Non weight bearing     Mobility  Bed Mobility Overal bed mobility: Modified Independent                  Transfers Overall transfer level: Needs assistance Equipment used: Rolling walker (2 wheels) Transfers: Sit to/from Stand Sit to Stand: Min assist                Ambulation/Gait Ambulation/Gait assistance: Min guard   Assistive device: Rolling walker (2 wheels) Gait Pattern/deviations: Step-to pattern Gait velocity: decreased     General Gait Details: Hop-to pattern with min verbal and visual cues for proper sequencing and minguard  for stability.   Stairs             Wheelchair Mobility    Modified Rankin (Stroke Patients Only)       Balance Overall balance assessment: Modified Independent Sitting-balance support: Feet unsupported Sitting balance-Leahy Scale: Good Sitting balance - Comments: good unsupported sitting.   Standing balance support: During functional activity, Reliant on assistive device for balance, Single extremity supported Standing balance-Leahy Scale: Fair Standing balance comment: Functional standing for bathroom activity/clothes managment and washing hands, min guard and min cues for safety.                            Cognition Arousal/Alertness: Awake/alert Behavior During Therapy: WFL for tasks assessed/performed Overall Cognitive Status: Within Functional Limits for tasks assessed                                          Exercises Other Exercises Other Exercises: reports doing ex on  her own.    General Comments        Pertinent Vitals/Pain Pain Assessment Pain Assessment: Faces Faces Pain Scale: Hurts little more Pain Location: R ankle Pain Descriptors / Indicators: Sore Pain Intervention(s): Limited activity within patient's tolerance, Monitored during session, Repositioned    Home Living                          Prior Function            PT Goals (current  goals can now be found in the care plan section) Progress towards PT goals: Progressing toward goals    Frequency           PT Plan Current plan remains appropriate    Co-evaluation              AM-PAC PT "6 Clicks" Mobility   Outcome Measure  Help needed turning from your back to your side while in a flat bed without using bedrails?: None Help needed moving from lying on your back to sitting on the side of a flat bed without using bedrails?: None Help needed moving to and from a bed to a chair (including a wheelchair)?: A Little Help needed standing up from a chair using your arms (e.g., wheelchair or bedside chair)?: A Little Help needed to walk in hospital room?: A Little Help needed climbing 3-5 steps with a railing? : A Lot 6 Click Score: 19    End of Session Equipment Utilized During Treatment: Gait belt Activity Tolerance: Patient tolerated treatment well Patient left: with call bell/phone within reach;with SCD's reapplied;in bed;with bed alarm set   PT Visit Diagnosis: Unsteadiness on feet (R26.81);Other abnormalities of gait and mobility (R26.89);History of falling (Z91.81);Muscle weakness (generalized) (M62.81);Pain Pain - Right/Left: Right     Time: 6789-3810 PT Time Calculation (min) (ACUTE ONLY): 8 min  Charges:  $Gait Training: 8-22 mins                    Chesley Noon, PTA 05/07/22, 2:17 PM

## 2022-05-08 DIAGNOSIS — E1140 Type 2 diabetes mellitus with diabetic neuropathy, unspecified: Secondary | ICD-10-CM | POA: Diagnosis not present

## 2022-05-08 DIAGNOSIS — F32A Depression, unspecified: Secondary | ICD-10-CM | POA: Diagnosis not present

## 2022-05-08 DIAGNOSIS — S82851A Displaced trimalleolar fracture of right lower leg, initial encounter for closed fracture: Secondary | ICD-10-CM | POA: Diagnosis not present

## 2022-05-08 DIAGNOSIS — Z7982 Long term (current) use of aspirin: Secondary | ICD-10-CM | POA: Diagnosis not present

## 2022-05-08 DIAGNOSIS — Z79899 Other long term (current) drug therapy: Secondary | ICD-10-CM | POA: Diagnosis not present

## 2022-05-08 DIAGNOSIS — Z7984 Long term (current) use of oral hypoglycemic drugs: Secondary | ICD-10-CM | POA: Diagnosis not present

## 2022-05-08 DIAGNOSIS — X58XXXA Exposure to other specified factors, initial encounter: Secondary | ICD-10-CM | POA: Diagnosis not present

## 2022-05-08 DIAGNOSIS — I10 Essential (primary) hypertension: Secondary | ICD-10-CM | POA: Diagnosis not present

## 2022-05-08 DIAGNOSIS — S82431A Displaced oblique fracture of shaft of right fibula, initial encounter for closed fracture: Secondary | ICD-10-CM | POA: Diagnosis not present

## 2022-05-08 DIAGNOSIS — E785 Hyperlipidemia, unspecified: Secondary | ICD-10-CM | POA: Diagnosis not present

## 2022-05-08 DIAGNOSIS — E876 Hypokalemia: Secondary | ICD-10-CM | POA: Diagnosis not present

## 2022-05-08 LAB — GLUCOSE, CAPILLARY
Glucose-Capillary: 110 mg/dL — ABNORMAL HIGH (ref 70–99)
Glucose-Capillary: 150 mg/dL — ABNORMAL HIGH (ref 70–99)
Glucose-Capillary: 230 mg/dL — ABNORMAL HIGH (ref 70–99)
Glucose-Capillary: 63 mg/dL — ABNORMAL LOW (ref 70–99)
Glucose-Capillary: 113 mg/dL — ABNORMAL HIGH (ref 70–99)

## 2022-05-08 NOTE — Progress Notes (Signed)
Occupational Therapy Treatment Patient Details Name: Lisa Galvan MRN: [id number] DOB: 09-Jan-1961 Today's Date: 05/08/2022   History of present illness Pt is a 62 y.o. female with medical history significant of hypertension, hyperlipidemia, diabetes mellitus, GERD, depression, chronic pain syndrome, OSA not on CPAP, who presents for surgical repair of right ankle fracture.  Pt diagnosed with right trimalleolar ankle fracture and is s/p ORIF.   OT comments  Upon entering the room, pt supine in bed and reports having just finished with PT session and is finishing up a virtual tour of a SNF with daughter. However, she is agreeable to OT intervention with compromise to utilize theraband for B UE strengthening exercises. Pt given yellow theraband and OT reviewing chest pulls, bicep curls, lateral pull downs, and how to also utilize use of bed rails for many other exercises. Pt returning demonstrations with reps of 5-10 and rest breaks as needed. Theraband left with pt in room for her to utilize when she has free time during the day. Pt continues to benefit from OT intervention with recommendation for SNF at discharge to address functional deficits before returning home.    Recommendations for follow up therapy are one component of a multi-disciplinary discharge planning process, led by the attending physician.  Recommendations may be updated based on patient status, additional functional criteria and insurance authorization.    Follow Up Recommendations  Skilled nursing-short term rehab (<3 hours/day)     Assistance Recommended at Discharge Intermittent Supervision/Assistance  Patient can return home with the following  A lot of help with walking and/or transfers;A lot of help with bathing/dressing/bathroom;Help with stairs or ramp for entrance;Assist for transportation;Assistance with cooking/housework   Equipment Recommendations  Other (comment) (defer to next venue of care)       Precautions /  Restrictions Precautions Precautions: Fall Required Braces or Orthoses: Splint/Cast Splint/Cast: R ankle Splint/Cast - Date Prophylactic Dressing Applied (if applicable): 78/29/56 Restrictions Weight Bearing Restrictions: Yes RLE Weight Bearing: Non weight bearing              ADL either performed or assessed with clinical judgement     Vision Patient Visual Report: No change from baseline            Cognition Arousal/Alertness: Awake/alert Behavior During Therapy: WFL for tasks assessed/performed Overall Cognitive Status: Within Functional Limits for tasks assessed                                                     Pertinent Vitals/ Pain       Pain Assessment Pain Assessment: 0-10 Pain Score: 3  Pain Location: R ankle Pain Descriptors / Indicators: Sore Pain Intervention(s): Monitored during session, Premedicated before session         Frequency  Min 2X/week        Progress Toward Goals  OT Goals(current goals can now be found in the care plan section)  Progress towards OT goals: Progressing toward goals  Acute Rehab OT Goals Patient Stated Goal: to get stronger and go to rehab OT Goal Formulation: With patient Time For Goal Achievement: 05/18/22 Potential to Achieve Goals: Good  Plan Discharge plan remains appropriate;Frequency remains appropriate       AM-PAC OT "6 Clicks" Daily Activity     Outcome Measure   Help from another person eating meals?: None Help from another  person taking care of personal grooming?: None Help from another person toileting, which includes using toliet, bedpan, or urinal?: A Lot Help from another person bathing (including washing, rinsing, drying)?: A Lot Help from another person to put on and taking off regular upper body clothing?: None Help from another person to put on and taking off regular lower body clothing?: A Lot 6 Click Score: 18    End of Session    OT Visit Diagnosis:  Unsteadiness on feet (R26.81);Repeated falls (R29.6);Muscle weakness (generalized) (M62.81)   Activity Tolerance Patient tolerated treatment well   Patient Left in bed;with call bell/phone within reach;with bed alarm set   Nurse Communication Mobility status        Time: 1100-1120 OT Time Calculation (min): 20 min  Charges: OT General Charges $OT Visit: 1 Visit OT Treatments $Therapeutic Exercise: 8-22 mins  Darleen Crocker, MS, OTR/L , CBIS ascom [phone]  05/08/22, 12:49 PM

## 2022-05-08 NOTE — TOC Progression Note (Signed)
Transition of Care [facility]) - Progression Note    Patient Details  Name: Lisa Galvan MRN: [id number] Date of Birth: [birth year]  Transition of Care [facility]) CM/SW [place], RN Phone Number: 05/08/2022, 9:44 AM  Clinical Narrative:   Spoke with [place] Magda Paganini) and determined the patients insurance does not cover thr entire stay and she has a copay, due to non weight bearing for 4-6 weeks she will have to pay the copay upfront for the 6 weeks, Magda Paganini is calling the [facility] to determine how much the copay is    Expected Discharge Plan: [place] Barriers to Discharge: SNF Pending bed offer, Insurance Authorization  Expected Discharge Plan and Services                                               Social Determinants of Health (SDOH) Interventions SDOH Screenings   Food Insecurity: No Food Insecurity (05/04/2022)  Housing: Low Risk  (05/04/2022)  Transportation Needs: No Transportation Needs (05/04/2022)  Utilities: Not At Risk (05/04/2022)  Alcohol Screen: Low Risk  (12/21/2021)  Depression (PHQ2-9): Low Risk  (04/20/2022)  Financial Resource Strain: High Risk (01/02/2022)  Physical Activity: Inactive (12/14/2020)  Social Connections: Moderately Isolated (12/21/2021)  Stress: Stress Concern Present (12/21/2021)  Tobacco Use: Low Risk  (05/04/2022)    Readmission Risk Interventions     No data to display

## 2022-05-08 NOTE — Progress Notes (Signed)
Physical Therapy Treatment Patient Details Name: Lisa Galvan MRN: [id number] DOB: [birth year] Today's Date: 05/08/2022   History of Present Illness Pt is a 62 y.o. female with medical history significant of hypertension, hyperlipidemia, diabetes mellitus, GERD, depression, chronic pain syndrome, OSA not on CPAP, who presents for surgical repair of right ankle fracture.  Pt diagnosed with right trimalleolar ankle fracture and is s/p ORIF.    PT Comments    Pt was pleasant and motivated to participate during the session and put forth good effort throughout. Pt required cuing for general sequencing with transfers and gait but not related to RLE WB compliance which she maintained independently without the need for cuing.  Pt was able to amb with hop-to pattern 30 feet with no adverse symptoms other than her baseline R ankle pain with SpO2 and HR WNL on room air.  Pt will benefit from PT services in a SNF setting upon discharge to safely address deficits listed in patient problem list for decreased caregiver assistance and eventual return to PLOF.     Recommendations for follow up therapy are one component of a multi-disciplinary discharge planning process, led by the attending physician.  Recommendations may be updated based on patient status, additional functional criteria and insurance authorization.  Follow Up Recommendations  Skilled nursing-short term rehab (<3 hours/day) Can patient physically be transported by private vehicle: Yes   Assistance Recommended at Discharge Intermittent Supervision/Assistance  Patient can return home with the following A little help with bathing/dressing/bathroom;Assistance with cooking/housework;Help with stairs or ramp for entrance;Assist for transportation;A little help with walking and/or transfers   Equipment Recommendations  None recommended by PT    Recommendations for Other Services       Precautions / Restrictions Precautions Precautions:  Fall Required Braces or Orthoses: Splint/Cast Splint/Cast: R ankle Splint/Cast - Date Prophylactic Dressing Applied (if applicable): 22/97/98 Restrictions Weight Bearing Restrictions: Yes RLE Weight Bearing: Non weight bearing     Mobility  Bed Mobility Overal bed mobility: Modified Independent         Sit to supine: Modified independent (Device/Increase time)   General bed mobility comments: Min increased time and effort only    Transfers Overall transfer level: Needs assistance Equipment used: Rolling walker (2 wheels) Transfers: Sit to/from Stand Sit to Stand: Min guard           General transfer comment: Min cues for hand placement and increased trunk flexion; good compliance with WB status throughout with no cues needed for RLE positioning    Ambulation/Gait Ambulation/Gait assistance: Min guard Gait Distance (Feet): 30 Feet Assistive device: Rolling walker (2 wheels)   Gait velocity: decreased     General Gait Details: Hop-to pattern with min verbal and visual cues for proper sequencing most notably to prevent hopping too far forward relative to RW positioning and to stay within the RW during sharp turns   Stairs             Wheelchair Mobility    Modified Rankin (Stroke Patients Only)       Balance Overall balance assessment: Needs assistance Sitting-balance support: Feet unsupported Sitting balance-Leahy Scale: Normal Sitting balance - Comments: Normal unsupported sitting balance   Standing balance support: During functional activity, Reliant on assistive device for balance, Bilateral upper extremity supported Standing balance-Leahy Scale: Fair                              Cognition Arousal/Alertness: Awake/alert Behavior During  Therapy: WFL for tasks assessed/performed Overall Cognitive Status: Within Functional Limits for tasks assessed                                          Exercises Total Joint  Exercises Ankle Circles/Pumps: Left, Strengthening, AROM, 10 reps (toe wiggles RLE) Quad Sets: Strengthening, Both, 10 reps Gluteal Sets: Strengthening, Both, 10 reps Hip ABduction/ADduction: Strengthening, 10 reps, Right (with manual resistance) Straight Leg Raises: Strengthening, 10 reps, Right Long Arc Quad: Strengthening, Right, Other reps (comment) (2 x 10 with manual resistance) Knee Flexion: Strengthening, Right, Other reps (comment) (2 x 10 with manual resistance) Bridges: Strengthening, Both, 10 reps (bolster under R knee) Other Exercises Other Exercises: HEP review for compliance and technique    General Comments        Pertinent Vitals/Pain Pain Assessment Pain Assessment: 0-10 Pain Score: 4  Pain Location: R ankle Pain Descriptors / Indicators: Sore Pain Intervention(s): Premedicated before session, Monitored during session    Home Living                          Prior Function            PT Goals (current goals can now be found in the care plan section) Progress towards PT goals: Progressing toward goals    Frequency    BID      PT Plan Current plan remains appropriate    Co-evaluation              AM-PAC PT "6 Clicks" Mobility   Outcome Measure  Help needed turning from your back to your side while in a flat bed without using bedrails?: None Help needed moving from lying on your back to sitting on the side of a flat bed without using bedrails?: None Help needed moving to and from a bed to a chair (including a wheelchair)?: A Little Help needed standing up from a chair using your arms (e.g., wheelchair or bedside chair)?: A Little Help needed to walk in hospital room?: A Little Help needed climbing 3-5 steps with a railing? : A Lot 6 Click Score: 19    End of Session Equipment Utilized During Treatment: Gait belt Activity Tolerance: Patient tolerated treatment well Patient left: in chair;with call bell/phone within reach;with  SCD's reapplied;with family/visitor present Nurse Communication: Mobility status;Weight bearing status PT Visit Diagnosis: Unsteadiness on feet (R26.81);Other abnormalities of gait and mobility (R26.89);History of falling (Z91.81);Muscle weakness (generalized) (M62.81);Pain Pain - Right/Left: Right Pain - part of body: Ankle and joints of foot     Time: 3810-1751 PT Time Calculation (min) (ACUTE ONLY): 25 min  Charges:  $Gait Training: 8-22 mins $Therapeutic Exercise: 8-22 mins                    D. Scott Limmie Schoenberg PT, DPT 05/08/22, 2:47 PM

## 2022-05-08 NOTE — TOC Progression Note (Addendum)
Transition of Care [facility]) - Progression Note    Patient Details  Name: Lisa Galvan MRN: [id number] Date of Birth: 03/28/1961  Transition of Care [facility]) CM/SW [place], RN Phone Number: 05/08/2022, 4:27 PM  Clinical Narrative:   The patient's daughter is calling saying that [facility] is not in network with her insurance I called and notified allison at [place] of what the patient's stated that they are not in network with her insurance, Ebony Hail stated that she did run it and it showed up in network PPO actually will allow them to go to Any faciloity even out of Network    Expected Discharge Plan: [place] Barriers to Discharge: SNF Pending bed offer, Ship broker  Expected Discharge Plan and Services                                               Social Determinants of Health (SDOH) Interventions SDOH Screenings   Food Insecurity: No Food Insecurity (05/04/2022)  Housing: Low Risk  (05/04/2022)  Transportation Needs: No Transportation Needs (05/04/2022)  Utilities: Not At Risk (05/04/2022)  Alcohol Screen: Low Risk  (12/21/2021)  Depression (PHQ2-9): Low Risk  (04/20/2022)  Financial Resource Strain: High Risk (01/02/2022)  Physical Activity: Inactive (12/14/2020)  Social Connections: Moderately Isolated (12/21/2021)  Stress: Stress Concern Present (12/21/2021)  Tobacco Use: Low Risk  (05/04/2022)    Readmission Risk Interventions     No data to display

## 2022-05-08 NOTE — TOC Progression Note (Signed)
Transition of Care [facility]) - Progression Note    Patient Details  Name: Lisa Galvan MRN: [id number] Date of Birth: 04/22/1961  Transition of Care [facility]) CM/SW [place], RN Phone Number: 05/08/2022, 3:58 PM  Clinical Narrative:    The patients daughter Marciano Sequin called and told me that they did not accept the bed at [facility], She stated that she had the Ins call Magda Paganini at [facility] and explain that her Ins covers everything at 100%, Magda Paganini called me and let me know that they have not made a bed offer, They are having to run everything and can not make a bed offer until possibly tomorrow late if even then, I called Lashan back and explained that [facility] has not made a bed offer I explained that [facility] has made the offer, she stated that they are not in network with her Ins and that her ins will not cover them I explained that leaves [facility] and because she has a bed offer she could not continue holding in the hospital for a possible bed offer from [facility] when they may not make a bed offer at all , she asked what would happen if they did not want to go to [place], She stated that is too far and she needs to be near her mom that she also has a 73 year old daughter to care for, I explained Medicare guideline states that because she does have a bed offer and she is medically ready that she will have to either accept the bed offer that they have or the other option would be to go home, she stated she will call me back and hung up   Expected Discharge Plan: Skilled Nursing Facility Barriers to Discharge: SNF Pending bed offer, Insurance Authorization  Expected Discharge Plan and Services                                               Social Determinants of Health (SDOH) Interventions SDOH Screenings   Food Insecurity: No Food Insecurity (05/04/2022)  Housing: Low Risk  (05/04/2022)  Transportation Needs: No Transportation Needs  (05/04/2022)  Utilities: Not At Risk (05/04/2022)  Alcohol Screen: Low Risk  (12/21/2021)  Depression (PHQ2-9): Low Risk  (04/20/2022)  Financial Resource Strain: High Risk (01/02/2022)  Physical Activity: Inactive (12/14/2020)  Social Connections: Moderately Isolated (12/21/2021)  Stress: Stress Concern Present (12/21/2021)  Tobacco Use: Low Risk  (05/04/2022)    Readmission Risk Interventions     No data to display

## 2022-05-08 NOTE — TOC Progression Note (Signed)
Transition of Care [facility]) - Progression Note    Patient Details  Name: Lisa Galvan MRN: [id number] Date of Birth: Feb 27, 1961  Transition of Care [facility]) CM/SW [place], RN Phone Number: 05/08/2022, 9:18 AM  Clinical Narrative:     Met with the patient and her daughter in the room, I explained that [facility] has declined a bed offer, they called the facility and I resent it to [facility] so that they can review again, I explained that they will need to make a choice today as we do have bed offers, I reviewed the bed options we have in place,  They will call th9ose facilties and let me know their choice  Expected Discharge Plan: Skilled Nursing Facility Barriers to Discharge: SNF Pending bed offer, Insurance Authorization  Expected Discharge Plan and Services                                               Social Determinants of Health (SDOH) Interventions SDOH Screenings   Food Insecurity: No Food Insecurity (05/04/2022)  Housing: Low Risk  (05/04/2022)  Transportation Needs: No Transportation Needs (05/04/2022)  Utilities: Not At Risk (05/04/2022)  Alcohol Screen: Low Risk  (12/21/2021)  Depression (PHQ2-9): Low Risk  (04/20/2022)  Financial Resource Strain: High Risk (01/02/2022)  Physical Activity: Inactive (12/14/2020)  Social Connections: Moderately Isolated (12/21/2021)  Stress: Stress Concern Present (12/21/2021)  Tobacco Use: Low Risk  (05/04/2022)    Readmission Risk Interventions     No data to display

## 2022-05-08 NOTE — Progress Notes (Signed)
Physical Therapy Treatment Patient Details Name: Lisa Galvan MRN: [id number] DOB: [birth year] Today's Date: 05/08/2022   History of Present Illness Pt is a 61 y.o. female with medical history significant of hypertension, hyperlipidemia, diabetes mellitus, GERD, depression, chronic pain syndrome, OSA not on CPAP, who presents for surgical repair of right ankle fracture.  Pt diagnosed with right trimalleolar ankle fracture and is s/p ORIF.    PT Comments    Pt was pleasant and motivated to participate during the session and put forth good effort throughout. Pt required cuing for proper sequencing with transfers and gait but no physical assist.  Pt compliant with RLE NWB status throughout the session and reported no adverse symptoms other than mild to mod R ankle pain that did not worsen during the session.  Pt's SpO2 and HR WNL throughout the session on room air. Pt will benefit from PT services in a SNF setting upon discharge to safely address deficits listed in patient problem list for decreased caregiver assistance and eventual return to PLOF.     Recommendations for follow up therapy are one component of a multi-disciplinary discharge planning process, led by the attending physician.  Recommendations may be updated based on patient status, additional functional criteria and insurance authorization.  Follow Up Recommendations  Skilled nursing-short term rehab (<3 hours/day) Can patient physically be transported by private vehicle: Yes   Assistance Recommended at Discharge Intermittent Supervision/Assistance  Patient can return home with the following A little help with bathing/dressing/bathroom;Assistance with cooking/housework;Help with stairs or ramp for entrance;Assist for transportation;A little help with walking and/or transfers   Equipment Recommendations  None recommended by PT    Recommendations for Other Services       Precautions / Restrictions Precautions Precautions:  Fall Required Braces or Orthoses: Splint/Cast Splint/Cast: R ankle Splint/Cast - Date Prophylactic Dressing Applied (if applicable): 73/71/06 Restrictions Weight Bearing Restrictions: Yes RLE Weight Bearing: Non weight bearing     Mobility  Bed Mobility Overal bed mobility: Modified Independent         Sit to supine: Modified independent (Device/Increase time)   General bed mobility comments: Min increased time and effort only    Transfers Overall transfer level: Needs assistance Equipment used: Rolling walker (2 wheels) Transfers: Sit to/from Stand Sit to Stand: Min assist           General transfer comment: Mod cues for hand placement and increased trunk flexion; good compliance with WB status throughout with no cues needed for RLE positioning    Ambulation/Gait Ambulation/Gait assistance: Min guard Gait Distance (Feet): 20 Feet x 2 Assistive device: Rolling walker (2 wheels)   Gait velocity: decreased     General Gait Details: Hop-to pattern with min verbal and visual cues for proper sequencing most notably to prevent hopping too far forward relative to RW positioning   Stairs             Wheelchair Mobility    Modified Rankin (Stroke Patients Only)       Balance Overall balance assessment: Needs assistance   Sitting balance-Leahy Scale: Normal     Standing balance support: During functional activity, Reliant on assistive device for balance, Single extremity supported Standing balance-Leahy Scale: Fair                              Cognition Arousal/Alertness: Awake/alert Behavior During Therapy: WFL for tasks assessed/performed Overall Cognitive Status: Within Functional Limits for tasks assessed  Exercises Total Joint Exercises Ankle Circles/Pumps: Left, Strengthening, AROM, 10 reps (toe wiggles RLE) Quad Sets: Strengthening, Both, 10 reps Gluteal Sets:  Strengthening, Both, 10 reps Hip ABduction/ADduction: Strengthening, Both, 10 reps, 5 reps Straight Leg Raises: Strengthening, Both, 10 reps, 5 reps Long Arc Quad: Strengthening, Both, 10 reps Knee Flexion: Strengthening, Both, 10 reps Bridges: Strengthening, Both, 10 reps (bolster under R knee) Other Exercises Other Exercises: HEP review for compliance and technique    General Comments        Pertinent Vitals/Pain Pain Assessment Pain Assessment: 0-10 Pain Score: 4  Pain Location: R ankle Pain Descriptors / Indicators: Sore Pain Intervention(s): Repositioned, Premedicated before session, Monitored during session    Home Living                          Prior Function            PT Goals (current goals can now be found in the care plan section) Progress towards PT goals: Progressing toward goals    Frequency    BID      PT Plan Current plan remains appropriate    Co-evaluation              AM-PAC PT "6 Clicks" Mobility   Outcome Measure  Help needed turning from your back to your side while in a flat bed without using bedrails?: None Help needed moving from lying on your back to sitting on the side of a flat bed without using bedrails?: None Help needed moving to and from a bed to a chair (including a wheelchair)?: A Little Help needed standing up from a chair using your arms (e.g., wheelchair or bedside chair)?: A Little Help needed to walk in hospital room?: A Little Help needed climbing 3-5 steps with a railing? : A Lot 6 Click Score: 19    End of Session Equipment Utilized During Treatment: Gait belt Activity Tolerance: Patient tolerated treatment well Patient left: with call bell/phone within reach;with SCD's reapplied;in bed;with bed alarm set Nurse Communication: Mobility status;Weight bearing status PT Visit Diagnosis: Unsteadiness on feet (R26.81);Other abnormalities of gait and mobility (R26.89);History of falling (Z91.81);Muscle  weakness (generalized) (M62.81);Pain Pain - Right/Left: Right Pain - part of body: Ankle and joints of foot     Time: 1020-1044 PT Time Calculation (min) (ACUTE ONLY): 24 min  Charges:  $Gait Training: 8-22 mins $Therapeutic Exercise: 8-22 mins                     D. Scott Maggie Senseney PT, DPT 05/08/22, 12:00 PM

## 2022-05-08 NOTE — TOC Progression Note (Signed)
Transition of Care [facility]) - Progression Note    Patient Details  Name: Lisa Galvan MRN: [id number] Date of Birth: [birth year]  Transition of Care [facility]) CM/SW [place], RN Phone Number: 05/08/2022, 2:55 PM  Clinical Narrative:     Spoke to the patients daughter Marciano Sequin and she stated that that the insurance company will call with the needed information And then they will call [facility] with the needed information I notified Tonya  Expected Discharge Plan: Skilled Nursing Facility Barriers to Discharge: SNF Pending bed offer, Insurance Authorization  Expected Discharge Plan and Services                                               Social Determinants of Health (SDOH) Interventions SDOH Screenings   Food Insecurity: No Food Insecurity (05/04/2022)  Housing: Low Risk  (05/04/2022)  Transportation Needs: No Transportation Needs (05/04/2022)  Utilities: Not At Risk (05/04/2022)  Alcohol Screen: Low Risk  (12/21/2021)  Depression (PHQ2-9): Low Risk  (04/20/2022)  Financial Resource Strain: High Risk (01/02/2022)  Physical Activity: Inactive (12/14/2020)  Social Connections: Moderately Isolated (12/21/2021)  Stress: Stress Concern Present (12/21/2021)  Tobacco Use: Low Risk  (05/04/2022)    Readmission Risk Interventions     No data to display

## 2022-05-08 NOTE — TOC Progression Note (Signed)
Transition of Care [facility]) - Progression Note    Patient Details  Name: Lisa Galvan MRN: [id number] Date of Birth: 09-02-1960  Transition of Care [facility]) CM/SW [place], RN Phone Number: 05/08/2022, 2:02 PM  Clinical Narrative:   [facility] called and stated that the daughter came and toured the facility and told them to accept the bed offer, they are getting approval for Ins auth    Expected Discharge Plan: [place] Barriers to Discharge: SNF Pending bed offer, Insurance Authorization  Expected Discharge Plan and Services                                               Social Determinants of Health (SDOH) Interventions SDOH Screenings   Food Insecurity: No Food Insecurity (05/04/2022)  Housing: Low Risk  (05/04/2022)  Transportation Needs: No Transportation Needs (05/04/2022)  Utilities: Not At Risk (05/04/2022)  Alcohol Screen: Low Risk  (12/21/2021)  Depression (PHQ2-9): Low Risk  (04/20/2022)  Financial Resource Strain: High Risk (01/02/2022)  Physical Activity: Inactive (12/14/2020)  Social Connections: Moderately Isolated (12/21/2021)  Stress: Stress Concern Present (12/21/2021)  Tobacco Use: Low Risk  (05/04/2022)    Readmission Risk Interventions     No data to display

## 2022-05-08 NOTE — Progress Notes (Signed)
Progress Note   Patient: Lisa Galvan EPP:[id number] DOB: July 29, 1960 DOA: 05/03/2022     1 DOS: the patient was seen and examined on 05/08/2022   Brief hospital course: Taken from H&P.   Ravan Schlemmer is a 62 y.o. female with medical history significant of hypertension, hyperlipidemia, diabetes mellitus, GERD, depression, chronic pain syndrome, OSA not on CPAP, who presents with right ankle pain, apparently patient injured her right ankle 2 weeks ago and was found to have closed displaced trimalleolar fracture of right ankle. Pt underwent rIGHT TRIMALLEOLAR ANKLE FRACTURE ORIF by Dr. Luana Shu on the day of admission.  Data reviewed independently and ED Course: pt was found to have hemoglobin 12.2, renal function okay, potassium 3.4, temperature normal, blood pressure 170/64, heart rate 75, oxygen saturation 98% on room air.   Patient was admitted for further PT/OT evaluation-possible placement and pain control.  1/12: Mildly elevated blood pressure at 155/92.  CBG elevated at 205, CBC with slight decrease of hemoglobin to 11.3 and mild reactive thrombocytosis at 411.  Adding Semglee and mealtime coverage along with SSI.  PT/OT are recommending SNF.  TOC is aware and working on it.  1/13: Hemodynamically stable.  Awaiting SNF placement  1/14: No new concern.  Awaiting SNF placement, medically stable.  1/15: Remained stable, still no bed offer.  1/16: Remained stable, had a bed [employer] authorization now.  Assessment and Plan: * Closed displaced trimalleolar fracture of right ankle S/p ORIF by podiatry. PT/OT recommending SNF. -Continue with pain management  Depression -Continue home Cymbalta  Hypokalemia Hypomagnesemia.  Potassium improved with repletion.  Magnesium at 1.3. -Replete magnesium -Continue to monitor  OSA (obstructive sleep apnea) Not using CPAP currently  Essential hypertension -Continue with home metoprolol -As needed  hydralazine  Hyperlipidemia -Continue Lipitor  Type 2 diabetes mellitus with diabetic neuropathy, unspecified (HCC) CBG within goal with 1 hypoglycemic episode with CBG at 58 last night, recent A1c of 7.2.  Patient was on metformin at home. -Hold Semglee -Continue mealtime coverage-if eating more than 50% of meal. -Continue with SSI  Subjective: Patient was having 4 out of 10 pain after working with PT when seen today.  Still awaiting SNF placement  Physical Exam: Vitals:   05/07/22 0753 05/07/22 0835 05/07/22 2230 05/08/22 0746  BP: (!) 166/84 (!) 158/92 123/79 (!) 137/91  Pulse: (!) 59 (!) 59 77 68  Resp: 17  16 17   Temp: 98 F (36.7 C)  (!) 97.4 F (36.3 C) 98.3 F (36.8 C)  TempSrc:      SpO2: 99% 98% 97% 98%  Weight:      Height:       General.  Well-developed lady, in no acute distress. Pulmonary.  Lungs clear bilaterally, normal respiratory effort. CV.  Regular rate and rhythm, no JVD, rub or murmur. Abdomen.  Soft, nontender, nondistended, BS positive. CNS.  Alert and oriented .  No focal neurologic deficit. Extremities.  No edema, right foot with Ace wrap Psychiatry.  Judgment and insight appears normal.   Data Reviewed: Prior data reviewed  Family Communication: Discussed with patient  Disposition: Status is: Observation The patient will require care spanning > 2 midnights and should be moved to inpatient because: Severity of illness  Planned Discharge Destination: Skilled nursing facility  DVT prophylaxis.  Lovenox Time spent: 38 minutes  This record has been created using Systems analyst. Errors have been sought and corrected,but may not always be located. Such creation errors do not reflect on the standard of  care.   Author: Lorella Nimrod, MD 05/08/2022 3:23 PM  For on call review [email or web address].

## 2022-05-08 NOTE — Plan of Care (Signed)
  Problem: Education: Goal: Individualized Educational Video(s) Outcome: Progressing   Problem: Fluid Volume: Goal: Ability to maintain a balanced intake and output will improve Outcome: Progressing   Problem: Health Behavior/Discharge Planning: Goal: Ability to identify and utilize available resources and services will improve Outcome: Progressing   Problem: Health Behavior/Discharge Planning: Goal: Ability to manage health-related needs will improve Outcome: Progressing   Problem: Education: Goal: Knowledge of General Education information will improve Description: Including pain rating scale, medication(s)/side effects and non-pharmacologic comfort measures Outcome: Progressing   Problem: Activity: Goal: Risk for activity intolerance will decrease Outcome: Progressing   Problem: Pain Managment: Goal: General experience of comfort will improve Outcome: Progressing   Problem: Safety: Goal: Ability to remain free from injury will improve Outcome: Progressing

## 2022-05-09 DIAGNOSIS — X58XXXA Exposure to other specified factors, initial encounter: Secondary | ICD-10-CM | POA: Diagnosis not present

## 2022-05-09 DIAGNOSIS — Z7984 Long term (current) use of oral hypoglycemic drugs: Secondary | ICD-10-CM | POA: Diagnosis not present

## 2022-05-09 DIAGNOSIS — S82431A Displaced oblique fracture of shaft of right fibula, initial encounter for closed fracture: Secondary | ICD-10-CM | POA: Diagnosis not present

## 2022-05-09 DIAGNOSIS — S82851A Displaced trimalleolar fracture of right lower leg, initial encounter for closed fracture: Secondary | ICD-10-CM | POA: Diagnosis not present

## 2022-05-09 DIAGNOSIS — E876 Hypokalemia: Secondary | ICD-10-CM | POA: Diagnosis not present

## 2022-05-09 DIAGNOSIS — E1140 Type 2 diabetes mellitus with diabetic neuropathy, unspecified: Secondary | ICD-10-CM | POA: Diagnosis not present

## 2022-05-09 DIAGNOSIS — Z7982 Long term (current) use of aspirin: Secondary | ICD-10-CM | POA: Diagnosis not present

## 2022-05-09 DIAGNOSIS — Z79899 Other long term (current) drug therapy: Secondary | ICD-10-CM | POA: Diagnosis not present

## 2022-05-09 DIAGNOSIS — I10 Essential (primary) hypertension: Secondary | ICD-10-CM | POA: Diagnosis not present

## 2022-05-09 LAB — GLUCOSE, CAPILLARY
Glucose-Capillary: 116 mg/dL — ABNORMAL HIGH (ref 70–99)
Glucose-Capillary: 161 mg/dL — ABNORMAL HIGH (ref 70–99)
Glucose-Capillary: 177 mg/dL — ABNORMAL HIGH (ref 70–99)
Glucose-Capillary: 140 mg/dL — ABNORMAL HIGH (ref 70–99)

## 2022-05-09 MED ORDER — SENNOSIDES-DOCUSATE SODIUM 8.6-50 MG PO TABS: 1.0000 | ORAL_TABLET | Freq: Every evening | ORAL | 0 refills | Status: DC | PRN

## 2022-05-09 MED ORDER — INSULIN ASPART 100 UNIT/ML IJ SOLN
0.0000 [IU] | Freq: Three times a day (TID) | INTRAMUSCULAR | Status: DC
  Administered 2022-05-09 – 2022-05-11 (×3): 1 [IU] via SUBCUTANEOUS
  Filled 2022-05-09 (×3): qty 1

## 2022-05-09 MED ORDER — GABAPENTIN 300 MG PO CAPS: 300.0000 mg | ORAL_CAPSULE | Freq: Two times a day (BID) | ORAL | 0 refills | Status: DC

## 2022-05-09 MED ORDER — ENOXAPARIN SODIUM 40 MG/0.4ML IJ SOSY: 40.0000 mg | PREFILLED_SYRINGE | INTRAMUSCULAR | 0 refills | Status: DC

## 2022-05-09 MED ORDER — OXYCODONE-ACETAMINOPHEN 5-325 MG PO TABS: 1.0000 | ORAL_TABLET | Freq: Four times a day (QID) | ORAL | 0 refills | Status: DC | PRN

## 2022-05-09 MED ORDER — INSULIN ASPART 100 UNIT/ML IJ SOLN: 0.0000 [IU] | Freq: Every day | INTRAMUSCULAR | Status: DC

## 2022-05-09 NOTE — Progress Notes (Signed)
Physical Therapy Treatment Patient Details Name: Lisa Galvan MRN: [id number] DOB: 04-27-1960 Today's Date: 05/09/2022   History of Present Illness Pt is a 62 y.o. female with medical history significant of hypertension, hyperlipidemia, diabetes mellitus, GERD, depression, chronic pain syndrome, OSA not on CPAP, who presents for surgical repair of right ankle fracture.  Pt diagnosed with right trimalleolar ankle fracture and is s/p ORIF.    PT Comments    Pt had just "hopped" to/from BR with RN staff. She agrees to 2nd/ BID PT session and remains cooperative throughout. Supportive daughter present during session. Pt was able to stand from recliner > RW and then place RLE on knee scooter without physical assist. Pt then ambulated/rolled around RN station ~ 160 ft without LOB. Does have to take several standing rest breaks 2/2 fatigue. Overall pt tolerated well and continues to progress towards PT goals. Pt lives alone and does not have available assistance at DC. Also has stairs to enter home and is currently unsafe to attempt stairs at this time.  Continued recommendation to DC to SNF to maximize her independence with all ADLs.     Recommendations for follow up therapy are one component of a multi-disciplinary discharge planning process, led by the attending physician.  Recommendations may be updated based on patient status, additional functional criteria and insurance authorization.  Follow Up Recommendations  Skilled nursing-short term rehab (<3 hours/day) (pt lives alone and does not have any available assistance)     Assistance Recommended at Discharge Intermittent Supervision/Assistance  Patient can return home with the following A little help with bathing/dressing/bathroom;Assistance with cooking/housework;Help with stairs or ramp for entrance;Assist for transportation;A little help with walking and/or transfers   Equipment Recommendations  Other (comment) (defer to next level of care)        Precautions / Restrictions Precautions Precautions: Fall Required Braces or Orthoses: Splint/Cast Splint/Cast: Right Ankle Splint/Cast - Date Prophylactic Dressing Applied (if applicable): 32/20/25 Restrictions Weight Bearing Restrictions: Yes RLE Weight Bearing: Non weight bearing     Mobility  Bed Mobility Overal bed mobility: Modified Independent Bed Mobility: Supine to Sit, Sit to Supine  Supine to sit: Modified independent (Device/Increase time) Sit to supine: Modified independent (Device/Increase time) General bed mobility comments: In recliner pre/post session    Transfers Overall transfer level: Needs assistance Equipment used: Rolling walker (2 wheels) (knee scooter) Transfers: Sit to/from Stand Sit to Stand: Supervision    General transfer comment: pt was able to STS from recliner> RW then progress to RLE on  knee scooter.    Ambulation/Gait Ambulation/Gait assistance: Supervision Gait Distance (Feet): 160 Feet Assistive device:  (knee scooter) Gait Pattern/deviations: Step-to pattern Gait velocity: decreased     General Gait Details: Pt demonstarted safe ability to ambulate around RN unit with knee scooter. no LOB however 3 standing rest 2/2 to fatigue.   Balance Overall balance assessment: Needs assistance Sitting-balance support: Feet unsupported Sitting balance-Leahy Scale: Normal     Standing balance support: Bilateral upper extremity supported, During functional activity, Reliant on assistive device for balance Standing balance-Leahy Scale: Fair Standing balance comment: hx of falls. Higher likihood of falls with NWB restrictions       Cognition Arousal/Alertness: Awake/alert Behavior During Therapy: WFL for tasks assessed/performed Overall Cognitive Status: Within Functional Limits for tasks assessed  General Comments: Pt is A and O x 3               Pertinent Vitals/Pain Pain Assessment Pain Assessment: No/denies pain Pain Score:  0-No  pain Pain Location: R ankle Pain Descriptors / Indicators: Sore Pain Intervention(s): Limited activity within patient's tolerance, Monitored during session, Premedicated before session, Repositioned     PT Goals (current goals can now be found in the care plan section) Acute Rehab PT Goals Patient Stated Goal: rehab then home Progress towards PT goals: Progressing toward goals    Frequency    BID      PT Plan Current plan remains appropriate       AM-PAC PT "6 Clicks" Mobility   Outcome Measure  Help needed turning from your back to your side while in a flat bed without using bedrails?: None Help needed moving from lying on your back to sitting on the side of a flat bed without using bedrails?: None Help needed moving to and from a bed to a chair (including a wheelchair)?: A Little Help needed standing up from a chair using your arms (e.g., wheelchair or bedside chair)?: A Little Help needed to walk in hospital room?: A Little Help needed climbing 3-5 steps with a railing? : A Lot 6 Click Score: 19    End of Session   Activity Tolerance: Patient tolerated treatment well Patient left: in chair;with call bell/phone within reach;with SCD's reapplied;with family/visitor present Nurse Communication: Mobility status;Weight bearing status PT Visit Diagnosis: Unsteadiness on feet (R26.81);Other abnormalities of gait and mobility (R26.89);History of falling (Z91.81);Muscle weakness (generalized) (M62.81);Pain Pain - Right/Left: Right Pain - part of body: Ankle and joints of foot     Time: 1324-1340 PT Time Calculation (min) (ACUTE ONLY): 16 min  Charges:  $Gait Training: 8-22 mins                    Julaine Fusi PTA 05/09/22, 3:20 PM

## 2022-05-09 NOTE — TOC Progression Note (Signed)
Transition of Care [facility]) - Progression Note    Patient Details  Name: Lisa Galvan MRN: [id number] Date of Birth: 1960/09/19  Transition of Care [facility]) CM/SW [place], RN Phone Number: 05/09/2022, 2:41 PM  Clinical Narrative:     Called [facility] to follow up on the potential bed offer Waiting on a call back  Expected Discharge Plan: [place] Barriers to Discharge: SNF Pending bed offer, Insurance Authorization  Expected Discharge Plan and Services                                               Social Determinants of Health (SDOH) Interventions SDOH Screenings   Food Insecurity: No Food Insecurity (05/04/2022)  Housing: Low Risk  (05/04/2022)  Transportation Needs: No Transportation Needs (05/04/2022)  Utilities: Not At Risk (05/04/2022)  Alcohol Screen: Low Risk  (12/21/2021)  Depression (PHQ2-9): Low Risk  (04/20/2022)  Financial Resource Strain: High Risk (01/02/2022)  Physical Activity: Inactive (12/14/2020)  Social Connections: Moderately Isolated (12/21/2021)  Stress: Stress Concern Present (12/21/2021)  Tobacco Use: Low Risk  (05/04/2022)    Readmission Risk Interventions     No data to display

## 2022-05-09 NOTE — Progress Notes (Signed)
Occupational Therapy Treatment Patient Details Name: Lisa Galvan MRN: [id number] DOB: [birth year] Today's Date: 05/09/2022   History of present illness Pt is a 62 y.o. female with medical history significant of hypertension, hyperlipidemia, diabetes mellitus, GERD, depression, chronic pain syndrome, OSA not on CPAP, who presents for surgical repair of right ankle fracture.  Pt diagnosed with right trimalleolar ankle fracture and is s/p ORIF.   OT comments  Reviewed UE there. ex program with yellow theraband. Pt. performed 1 set 10 reps each for bilateral shoulder flexion, horizontal abduction, diagonal shoulder abduction, bilateral elbow flexion, and extension. Pt. education was provided about modified positions for the shoulder exercises as well. Pt. education was provided about NWB status. Pt. education was provided about general reacher use, and A/E use for  LE ADLs. Pt. continues to benefit from OT services for ADL training, A/E training, and pt. education about home modification, and DME.  Pt. will benefit from follow-up OT services upon discharge.   Recommendations for follow up therapy are one component of a multi-disciplinary discharge planning process, led by the attending physician.  Recommendations may be updated based on patient status, additional functional criteria and insurance authorization.    Follow Up Recommendations  Skilled nursing-short term rehab (<3 hours/day)     Assistance Recommended at Discharge    Patient can return home with the following  A lot of help with walking and/or transfers;A lot of help with bathing/dressing/bathroom;Help with stairs or ramp for entrance;Assist for transportation;Assistance with cooking/housework   Equipment Recommendations       Recommendations for Other Services      Precautions / Restrictions Precautions Precautions: Fall Required Braces or Orthoses: Splint/Cast Splint/Cast: Right Ankle Splint/Cast - Date Prophylactic Dressing  Applied (if applicable): 36/14/43 Restrictions Weight Bearing Restrictions: Yes RLE Weight Bearing: Non weight bearing       Mobility Bed Mobility               General bed mobility comments: Pt. up in recliner chair visiting with daughter upon arrival.    Transfers        Deferred                 Balance                                           ADL either performed or assessed with clinical judgement   ADL                   Upper Body Dressing : Set up;Independent   Lower Body Dressing: Moderate assistance                      Extremity/Trunk Assessment              Vision Patient Visual Report: No change from baseline     Perception     Praxis      Cognition Arousal/Alertness: Awake/alert Behavior During Therapy: WFL for tasks assessed/performed Overall Cognitive Status: Within Functional Limits for tasks assessed                                 General Comments: Pt is A and O x 3        Exercises      Shoulder Instructions  General Comments      Pertinent Vitals/ Pain       Pain Assessment Pain Assessment: No/denies pain Pain Score: 0-No pain Faces Pain Scale: No hurt Pain Intervention(s): Monitored during session  Home Living                                          Prior Functioning/Environment              Frequency  Min 2X/week        Progress Toward Goals  OT Goals(current goals can now be found in the care plan section)  Progress towards OT goals: Progressing toward goals  Acute Rehab OT Goals Patient Stated Goal: To get stronger OT Goal Formulation: With patient Time For Goal Achievement: 05/18/22 Potential to Achieve Goals: Good  Plan Discharge plan remains appropriate;Frequency remains appropriate    Co-evaluation                 AM-PAC OT "6 Clicks" Daily Activity     Outcome Measure   Help from another person  eating meals?: None Help from another person taking care of personal grooming?: None Help from another person toileting, which includes using toliet, bedpan, or urinal?: A Lot Help from another person bathing (including washing, rinsing, drying)?: A Lot   Help from another person to put on and taking off regular lower body clothing?: A Lot 6 Click Score: 14    End of Session Equipment Utilized During Treatment: Rolling walker (2 wheels)  OT Visit Diagnosis: Unsteadiness on feet (R26.81);Repeated falls (R29.6);Muscle weakness (generalized) (M62.81)   Activity Tolerance Patient tolerated treatment well   Patient Left in bed;with call bell/phone within reach;with bed alarm set   Nurse Communication Mobility status        Time: 0370-4888 OT Time Calculation (min): 23 min  Charges: OT General Charges $OT Visit: 1 Visit OT Treatments $Self Care/Home Management : 23-37 mins  Harrel Carina, MS, OTR/L  Harrel Carina 05/09/2022, 2:37 PM

## 2022-05-09 NOTE — Progress Notes (Signed)
PROGRESS NOTE  Lisa Galvan KGM:[id number] DOB: 05-13-1960 DOA: 05/03/2022 PCP: Dorcas Carrow, DO  Hospital Course/Subjective:  Lisa Galvan is a 62 y.o. female with medical history significant of hypertension, hyperlipidemia, diabetes mellitus, GERD, depression, chronic pain syndrome, OSA not on CPAP, who presents with right ankle pain, apparently patient injured her right ankle 2 weeks ago and was found to have closed displaced trimalleolar fracture of right ankle. Pt underwent rIGHT TRIMALLEOLAR ANKLE FRACTURE ORIF by Dr. Excell Seltzer on the day of admission.    Assessment/Plan:  Principal Problem:   Closed displaced trimalleolar fracture of right ankle Active Problems:   Type 2 diabetes mellitus with diabetic neuropathy, unspecified (HCC)   Hyperlipidemia   Essential hypertension   OSA (obstructive sleep apnea)   Hypokalemia   Depression   Assessment and Plan: * Closed displaced trimalleolar fracture of right ankle S/p ORIF by podiatry. PT/OT recommending SNF, she is NWB of the right ankle -Continue with pain management - DPM recommend Lovenox daily until ambulatory (noted 4-6 weeks)  Depression -Continue home Cymbalta  Hypokalemia Hypomagnesemia.  Potassium improved with repletion.  Magnesium at 1.3. -Replete magnesium -Continue to monitor  OSA (obstructive sleep apnea) Not using CPAP currently  Essential hypertension -Continue with home metoprolol -As needed hydralazine  Hyperlipidemia -Continue Lipitor  Type 2 diabetes mellitus with diabetic neuropathy, unspecified (HCC) CBG within goal with 1 hypoglycemic episode with CBG at 56 last night, recent A1c of 7.2.  Patient was on metformin at home. -Hold Semglee -Continue mealtime coverage-if eating more than 50% of meal, reduced to 0-6 unit scale. -Continue with SSI  DVT Prophylaxis: Lovenox  Code Status: Full Code   Family Communication: None present, patient is AAOx4.   Disposition Plan: SNF    Consultants: DPM   Procedures: ORIF on 1/11   Antimicrobials: Anti-infectives (From admission, onward)    Start     Dose/Rate Route Frequency Ordered Stop   05/04/22 1000  amoxicillin-clavulanate (AUGMENTIN) 875-125 MG per tablet 1 tablet        1 tablet Oral Every 12 hours 05/04/22 0825 05/11/22 0959   05/03/22 1146  ceFAZolin (ANCEF) 2-4 GM/100ML-% IVPB       Note to Pharmacy: Brain Hilts F: cabinet override      05/03/22 1146 05/03/22 1324   05/03/22 1145  ceFAZolin (ANCEF) IVPB 2g/100 mL premix        2 g 200 mL/hr over 30 Minutes Intravenous On call to O.R. 05/03/22 1141 05/03/22 1331       Objective: Vitals:   05/08/22 2243 05/08/22 2246 05/08/22 2319 05/09/22 0934  BP: (!) 153/92 (!) 153/92 118/69 (!) 144/82  Pulse: 75 74 73 70  Resp:   18 18  Temp:   98.1 F (36.7 C) 97.9 F (36.6 C)  TempSrc:      SpO2:   94% 99%  Weight:      Height:        Intake/Output Summary (Last 24 hours) at 05/09/2022 1127 Last data filed at 05/09/2022 0514 Gross per 24 hour  Intake --  Output 900 ml  Net -900 ml   Filed Weights   05/03/22 1207  Weight: 68.5 kg   Exam: General:  Alert, oriented, calm, in no acute distress Eyes: EOMI, clear sclerea Neck: supple, no masses, trachea mildline  Cardiovascular: RRR, no murmurs or rubs, no peripheral edema  Respiratory: clear to auscultation bilaterally, no wheezes, no crackles  Abdomen: soft, nontender, nondistended, normal bowel tones heard  Skin: dry, no rashes  Musculoskeletal: no joint effusions, normal range of motion  Psychiatric: appropriate affect, normal speech  Neurologic: extraocular muscles intact, clear speech, moving all extremities with intact sensorium   Data Reviewed: CBC: Recent Labs  Lab 05/03/22 1231 05/04/22 0510  WBC  --  5.6  HGB 12.2 11.3*  HCT 36.0 34.3*  MCV  --  84.7  PLT  --  710*   Basic Metabolic Panel: Recent Labs  Lab 05/03/22 1231 05/03/22 1736 05/04/22 0510 05/05/22 0639   NA 137  --  135 136  K 3.4*  --  4.2 3.6  CL 98  --  102 104  CO2  --   --  23 27  GLUCOSE 89  --  248* 123*  BUN <3*  --  7* 6*  CREATININE 0.60  --  0.90 0.79  CALCIUM  --   --  8.4* 8.7*  MG  --  1.3* 1.3* 2.1   GFR: Estimated Creatinine Clearance: 66.8 mL/min (by C-G formula based on SCr of 0.79 mg/dL). Liver Function Tests: No results for input(s): "AST", "ALT", "ALKPHOS", "BILITOT", "PROT", "ALBUMIN" in the last 168 hours. No results for input(s): "LIPASE", "AMYLASE" in the last 168 hours. No results for input(s): "AMMONIA" in the last 168 hours. Coagulation Profile: No results for input(s): "INR", "PROTIME" in the last 168 hours. Cardiac Enzymes: No results for input(s): "CKTOTAL", "CKMB", "CKMBINDEX", "TROPONINI" in the last 168 hours. BNP (last 3 results) No results for input(s): "PROBNP" in the last 8760 hours. HbA1C: No results for input(s): "HGBA1C" in the last 72 hours. CBG: Recent Labs  Lab 05/08/22 1202 05/08/22 1703 05/08/22 2121 05/08/22 2222 05/09/22 0741  GLUCAP 150* 230* 63* 113* 116*   Lipid Profile: No results for input(s): "CHOL", "HDL", "LDLCALC", "TRIG", "CHOLHDL", "LDLDIRECT" in the last 72 hours. Thyroid Function Tests: No results for input(s): "TSH", "T4TOTAL", "FREET4", "T3FREE", "THYROIDAB" in the last 72 hours. Anemia Panel: No results for input(s): "VITAMINB12", "FOLATE", "FERRITIN", "TIBC", "IRON", "RETICCTPCT" in the last 72 hours. Urine analysis:    Component Value Date/Time   COLORURINE YELLOW (A) 12/11/2021 0925   APPEARANCEUR HAZY (A) 12/11/2021 0925   APPEARANCEUR Clear 03/06/2021 0944   LABSPEC 1.011 12/11/2021 0925   PHURINE 5.0 12/11/2021 0925   GLUCOSEU NEGATIVE 12/11/2021 0925   HGBUR NEGATIVE 12/11/2021 0925   BILIRUBINUR NEGATIVE 12/11/2021 0925   BILIRUBINUR Negative 03/06/2021 [place] 12/11/2021 0925   PROTEINUR NEGATIVE 12/11/2021 0925   NITRITE NEGATIVE 12/11/2021 0925   LEUKOCYTESUR SMALL (A)  12/11/2021 0925   Sepsis Labs: @LABRCNTIP (procalcitonin:4,lacticidven:4)  )No results found for this or any previous visit (from the past 240 hour(s)).   Studies: No results found.  Scheduled Meds:  amoxicillin-clavulanate  1 tablet Oral Q12H   aspirin EC  81 mg Oral Daily   atorvastatin  20 mg Oral Daily   cholecalciferol  5,000 Units Oral Daily   cyanocobalamin  1,000 mcg Oral Daily   DULoxetine  60 mg Oral Daily   enoxaparin  40 mg Subcutaneous Q24H   gabapentin  300 mg Oral BID   gabapentin  600 mg Oral QHS   insulin aspart  0-5 Units Subcutaneous QHS   insulin aspart  0-5 Units Subcutaneous QHS   insulin aspart  0-6 Units Subcutaneous TID WC   insulin aspart  3 Units Subcutaneous TID WC   loratadine  10 mg Oral Daily   magnesium gluconate  500 mg Oral BID   metoprolol tartrate  25 mg Oral BID  pantoprazole  40 mg Oral Daily    Continuous Infusions:   LOS: 1 day   Time spent: 26 minutes  Lisa Groeneveld Marry Guan, MD [facility] Pager [phone]  If 7PM-7AM, please contact night-coverage [email or web address] Password [facility] 05/09/2022, 11:27 AM

## 2022-05-09 NOTE — TOC Progression Note (Signed)
Transition of Care [facility]) - Progression Note    Patient Details  Name: Lisa Galvan MRN: [id number] Date of Birth: Apr 02, 1961  Transition of Care [facility]) CM/SW [place], RN Phone Number: 05/09/2022, 9:43 AM  Clinical Narrative:   Damaris Schooner with Audry Pili at [facility] and resent the referral to him, awaiting to see if they can make a bed offer    Expected Discharge Plan: [place] Barriers to Discharge: SNF Pending bed offer, Insurance Authorization  Expected Discharge Plan and Services                                               Social Determinants of Health (SDOH) Interventions SDOH Screenings   Food Insecurity: No Food Insecurity (05/04/2022)  Housing: Low Risk  (05/04/2022)  Transportation Needs: No Transportation Needs (05/04/2022)  Utilities: Not At Risk (05/04/2022)  Alcohol Screen: Low Risk  (12/21/2021)  Depression (PHQ2-9): Low Risk  (04/20/2022)  Financial Resource Strain: High Risk (01/02/2022)  Physical Activity: Inactive (12/14/2020)  Social Connections: Moderately Isolated (12/21/2021)  Stress: Stress Concern Present (12/21/2021)  Tobacco Use: Low Risk  (05/04/2022)    Readmission Risk Interventions     No data to display

## 2022-05-09 NOTE — Inpatient Diabetes Management (Signed)
Inpatient Diabetes Program Recommendations  AACE/ADA: New Consensus Statement on Inpatient Glycemic Control   Target Ranges:  Prepandial:   less than 140 mg/dL      Peak postprandial:   less than 180 mg/dL (1-2 hours)      Critically ill patients:  140 - 180 mg/dL    Latest Reference Range & Units 05/09/22 07:41  Glucose-Capillary 70 - 99 mg/dL 116 (H)    Latest Reference Range & Units 05/08/22 07:53 05/08/22 12:02 05/08/22 17:03 05/08/22 21:21 05/08/22 22:22  Glucose-Capillary 70 - 99 mg/dL 110 (H) 150 (H) 230 (H) 63 (L) 113 (H)   Review of Glycemic Control  Diabetes history: DM2 Outpatient Diabetes medications: Metformin 1000 mg BID Current orders for Inpatient glycemic control: Novolog 0-9 units TID with meals, Novolog 0-5 units QHS, Novolog 3 units TID with meals  Inpatient Diabetes Program Recommendations:    Insulin: Please consider decreasing Novolog correction to 0-6 units TID with meals.  Thanks, Barnie Alderman, RN, MSN, [place] Diabetes Coordinator Inpatient Diabetes Program [phone] (Team Pager from 8am to [place])

## 2022-05-09 NOTE — TOC Progression Note (Signed)
Transition of Care [facility]) - Progression Note    Patient Details  Name: Lisa Galvan MRN: [id number] Date of Birth: 01-20-1961  Transition of Care [name]) CM/SW [place], RN Phone Number: 05/09/2022, 3:29 PM  Clinical Narrative:     Damaris Schooner with Ricky at [facility], they are goiing to offer a bed she will hjave a one time copay he will call daughter Marciano Sequin and let her know, They will get Ins auth, Ins is pending  Expected Discharge Plan: Skilled Nursing Facility Barriers to Discharge: SNF Pending bed offer, Insurance Authorization  Expected Discharge Plan and Services                                               Social Determinants of Health (SDOH) Interventions SDOH Screenings   Food Insecurity: No Food Insecurity (05/04/2022)  Housing: Low Risk  (05/04/2022)  Transportation Needs: No Transportation Needs (05/04/2022)  Utilities: Not At Risk (05/04/2022)  Alcohol Screen: Low Risk  (12/21/2021)  Depression (PHQ2-9): Low Risk  (04/20/2022)  Financial Resource Strain: High Risk (01/02/2022)  Physical Activity: Inactive (12/14/2020)  Social Connections: Moderately Isolated (12/21/2021)  Stress: Stress Concern Present (12/21/2021)  Tobacco Use: Low Risk  (05/04/2022)    Readmission Risk Interventions     No data to display

## 2022-05-09 NOTE — TOC Progression Note (Signed)
Transition of Care [facility]) - Progression Note    Patient Details  Name: Lisa Galvan MRN: [id number] Date of Birth: February 01, 1961  Transition of Care [facility]) CM/SW [place], RN Phone Number: 05/09/2022, 9:34 AM  Clinical Narrative:   Received a call from [facility] the patient's daughter, she reports that she went to tour the facility [facility] and asked that I give them a call, I called [facility] to speak to [facility], he is in a meeting and will call me back    Expected Discharge Plan: Skilled Nursing Facility Barriers to Discharge: SNF Pending bed offer, Insurance Authorization  Expected Discharge Plan and Services                                               Social Determinants of Health (SDOH) Interventions SDOH Screenings   Food Insecurity: No Food Insecurity (05/04/2022)  Housing: Low Risk  (05/04/2022)  Transportation Needs: No Transportation Needs (05/04/2022)  Utilities: Not At Risk (05/04/2022)  Alcohol Screen: Low Risk  (12/21/2021)  Depression (PHQ2-9): Low Risk  (04/20/2022)  Financial Resource Strain: High Risk (01/02/2022)  Physical Activity: Inactive (12/14/2020)  Social Connections: Moderately Isolated (12/21/2021)  Stress: Stress Concern Present (12/21/2021)  Tobacco Use: Low Risk  (05/04/2022)    Readmission Risk Interventions     No data to display

## 2022-05-09 NOTE — TOC Benefit Eligibility Note (Signed)
Transition of Care [facility]) Benefit Eligibility Note    Patient Details  Name: Lisa Galvan MRN: [id number] Date of Birth: 09/16/1960  Number called: [phone] Rep: Haddie Reference Number: [id number]  [facility] Options PPO Open Access Plan effective since 10/21/20 to current.  Runs contract year. Current benefit plan runs 10/21/21 - 10/21/22.   Deductible & Out of Pocket Information: Tier 1 - No information provided.  Tier 2 Blue Choice PPO: $300 individual deductible, $0 met as of time of call  $3,000 individual out of pocket max, $1541.89 met as of time of call  Tier 3 Blue Choice Out of Network Benefits: $400 individual deductible, $0 met as of time of call Out of pocket max does not apply  SNF Benefits: Tier 2: $475 copay per care interval.  Plan covers 90% of allowed amount, responsible for 10% co-insurance. Cost containment applies.  Per rep, care interval copay does not apply to deductible, however copay and deductible applies to out of pocket max. No visit max.    Tier 3: $575 copay per care interval.  Plan covers 60% of the allowed amount, responsible for 40% co-insurance.  Cost containment applies.  Per rep, care interval copay does not apply to deductible.  No out of pocket max.  No visit max.   Prior Josem Kaufmann is required for SNF services through [facility].   To determine in-network status/Tier category, rep advised facilities can call [phone].  In-network status can also be checked at [email or web address]    Dannette Barbara Phone Number: [phone] 05/09/2022, 9:09 AM

## 2022-05-09 NOTE — Plan of Care (Signed)
  Problem: Skin Integrity: Goal: Risk for impaired skin integrity will decrease Outcome: Progressing   Problem: Pain Managment: Goal: General experience of comfort will improve Outcome: Progressing   Problem: Safety: Goal: Ability to remain free from injury will improve Outcome: Progressing   Problem: Skin Integrity: Goal: Risk for impaired skin integrity will decrease Outcome: Progressing

## 2022-05-09 NOTE — Progress Notes (Signed)
Physical Therapy Treatment Patient Details Name: Lisa Galvan MRN: [id number] DOB: 01-31-1961 Today's Date: 05/09/2022   History of Present Illness Pt is a 62 y.o. female with medical history significant of hypertension, hyperlipidemia, diabetes mellitus, GERD, depression, chronic pain syndrome, OSA not on CPAP, who presents for surgical repair of right ankle fracture.  Pt diagnosed with right trimalleolar ankle fracture and is s/p ORIF.    PT Comments    Pt was long sitting in bed upon arrival. She is A and O x 4 and cooperative and pleasant throughout. She was able to exit bed, stand to RW and "hop" to BR. Does well with adhering to NWB until fatigued and then requires several standing rest + performs occasional TTWB. Issued knee scooter and pt was able to perform well. Will return this afternoon and continue to focus on advancing per current POC.   Recommendations for follow up therapy are one component of a multi-disciplinary discharge planning process, led by the attending physician.  Recommendations may be updated based on patient status, additional functional criteria and insurance authorization.  Follow Up Recommendations  Skilled nursing-short term rehab (<3 hours/day)     Assistance Recommended at Discharge Intermittent Supervision/Assistance  Patient can return home with the following A little help with bathing/dressing/bathroom;Assistance with cooking/housework;Help with stairs or ramp for entrance;Assist for transportation;A little help with walking and/or transfers   Equipment Recommendations  None recommended by PT       Precautions / Restrictions Precautions Precautions: Fall Required Braces or Orthoses: Splint/Cast Splint/Cast: R ankle Restrictions Weight Bearing Restrictions: Yes RLE Weight Bearing: Non weight bearing     Mobility  Bed Mobility Overal bed mobility: Modified Independent Bed Mobility: Supine to Sit, Sit to Supine  Supine to sit: Modified  independent (Device/Increase time) Sit to supine: Modified independent (Device/Increase time)     Transfers Overall transfer level: Needs assistance Equipment used: Rolling walker (2 wheels) Transfers: Sit to/from Stand Sit to Stand: Supervision  General transfer comment: Pt was able to stand from EOB to RW without physical assistance    Ambulation/Gait Ambulation/Gait assistance: Min guard, Supervision Gait Distance (Feet): 30 Feet Assistive device: Rolling walker (2 wheels) Gait Pattern/deviations: Step-to pattern (" Hop to") Gait velocity: decreased     General Gait Details: Pt was able to ambulate to/from BR. once fatigue performs more toe touch wt bearing versus NWB. Elected to ambulate with knee scooter and pt was able to roll to/from RN station without concerns.    Balance Overall balance assessment: Needs assistance Sitting-balance support: Feet unsupported Sitting balance-Leahy Scale: Normal     Standing balance support: During functional activity, Reliant on assistive device for balance, Bilateral upper extremity supported Standing balance-Leahy Scale: Good       Cognition Arousal/Alertness: Awake/alert Behavior During Therapy: WFL for tasks assessed/performed Overall Cognitive Status: Within Functional Limits for tasks assessed      General Comments: Pt is A and O x 3               Pertinent Vitals/Pain Pain Assessment Pain Assessment: No/denies pain Pain Score: 0-No pain Pain Location: R ankle Pain Descriptors / Indicators: Sore Pain Intervention(s): Limited activity within patient's tolerance, Monitored during session, Premedicated before session, Repositioned     PT Goals (current goals can now be found in the care plan section) Acute Rehab PT Goals Patient Stated Goal: rehab then home Progress towards PT goals: Progressing toward goals    Frequency    BID      PT Plan  Current plan remains appropriate       AM-PAC PT "6 Clicks"  Mobility   Outcome Measure  Help needed turning from your back to your side while in a flat bed without using bedrails?: None Help needed moving from lying on your back to sitting on the side of a flat bed without using bedrails?: None Help needed moving to and from a bed to a chair (including a wheelchair)?: A Little Help needed standing up from a chair using your arms (e.g., wheelchair or bedside chair)?: A Little Help needed to walk in hospital room?: A Little Help needed climbing 3-5 steps with a railing? : A Lot 6 Click Score: 19    End of Session   Activity Tolerance: Patient tolerated treatment well Patient left: in chair;with call bell/phone within reach;with SCD's reapplied;with family/visitor present Nurse Communication: Mobility status;Weight bearing status PT Visit Diagnosis: Unsteadiness on feet (R26.81);Other abnormalities of gait and mobility (R26.89);History of falling (Z91.81);Muscle weakness (generalized) (M62.81);Pain Pain - Right/Left: Right Pain - part of body: Ankle and joints of foot     Time: 0910-0930 PT Time Calculation (min) (ACUTE ONLY): 20 min  Charges:  $Gait Training: 8-22 mins                     Julaine Fusi PTA 05/09/22, 11:13 AM

## 2022-05-09 NOTE — Progress Notes (Signed)
PODIATRY / FOOT AND ANKLE SURGERY PROGRESS NOTE  Chief Complaint: Right ankle fracture   HPI: Lisa Galvan is a 62 y.o. female who presents s/p 6 days right ankle fracture ORIF.  Patient states her pain is fairly well controlled at this time.  She has been taking pain medication regularly.  She denies n/v/f/c.  She has worked with PT/OT and they rec SNF/rehab.  PMHx:  Past Medical History:  Diagnosis Date   Allergy    Anemia    Arthritis    spine   Bilateral leg edema    Chest pain 12/15/2014   Chronic sciatica 12/10/2014   DDD (degenerative disc disease), lumbar    Essential hypertension 12/10/2014   Gastroesophageal reflux disease without esophagitis    Mixed hyperlipidemia    Neuropathy    Obesity (BMI 30.0-34.9) 12/10/2014   Pneumonia 1968   Sleep apnea    waiting for new cpap   Type 2 diabetes mellitus without complication (HCC) 12/10/2014   Umbilical hernia     Surgical Hx:  Past Surgical History:  Procedure Laterality Date   ABDOMINAL HYSTERECTOMY     ANKLE CLOSED REDUCTION Right 04/15/2022   Procedure: CLOSED REDUCTION ANKLE;  Surgeon: Edwin Cap, DPM;  Location: [facility] ORS;  Service: Podiatry;  Laterality: Right;   COLONOSCOPY WITH PROPOFOL N/A 03/30/2019   Procedure: COLONOSCOPY WITH PROPOFOL;  Surgeon: Toney Reil, MD;  Location: [facility] ENDOSCOPY;  Service: Gastroenterology;  Laterality: N/A;   ESOPHAGOGASTRODUODENOSCOPY (EGD) WITH PROPOFOL N/A 03/12/2017   Procedure: ESOPHAGOGASTRODUODENOSCOPY (EGD) WITH PROPOFOL;  Surgeon: Toney Reil, MD;  Location: [facility] ENDOSCOPY;  Service: Gastroenterology;  Laterality: N/A;   ESOPHAGOGASTRODUODENOSCOPY (EGD) WITH PROPOFOL N/A 03/30/2019   Procedure: ESOPHAGOGASTRODUODENOSCOPY (EGD) WITH PROPOFOL;  Surgeon: Toney Reil, MD;  Location: [facility] ENDOSCOPY;  Service: Gastroenterology;  Laterality: N/A;   HERNIA REPAIR     INCISIONAL HERNIA REPAIR N/A 11/29/2020   Procedure: HERNIA REPAIR INCISIONAL, open;   Surgeon: Leafy Ro, MD;  Location: [facility] ORS;  Service: General;  Laterality: N/A;   ORIF ANKLE FRACTURE Left 07/30/2019   Procedure: OPEN REDUCTION INTERNAL FIXATION (ORIF) OF LEFT BIMALLEOLAR ANKLE FRACTURE;  Surgeon: Signa Kell, MD;  Location: [facility];  Service: Orthopedics;  Laterality: Left;  Diabetic - oral meds   ORIF ANKLE FRACTURE Right 05/03/2022   Procedure: OPEN REDUCTION INTERNAL FIXATION (ORIF) ANKLE FRACTURE - TRIMALLEOLAR WITH INTERNAL FIXATION;  Surgeon: Rosetta Posner, DPM;  Location: [facility] ORS;  Service: Podiatry;  Laterality: Right;   SUPRACERVICAL ABDOMINAL HYSTERECTOMY     UMBILICAL HERNIA REPAIR N/A 05/01/2017   Procedure: HERNIA REPAIR UMBILICAL ADULT;  Surgeon: Ancil Linsey, MD;  Location: [facility] ORS;  Service: General;  Laterality: N/A;   XI ROBOTIC ASSISTED VENTRAL HERNIA N/A 10/06/2019   Procedure: XI ROBOTIC ASSISTED VENTRAL HERNIA;  Surgeon: Leafy Ro, MD;  Location: [facility] ORS;  Service: General;  Laterality: N/A;    FHx:  Family History  Problem Relation Age of Onset   Diabetes Mother    Cancer Mother        breast and stomach   Breast cancer Mother 85   Dementia Mother    Heart disease Father    Kidney disease Father        dialysis   Cancer Father        colon   Diabetes Father    Hypertension Sister    Hypertension Brother    Hypertension Daughter    Congestive Heart Failure Maternal Grandmother  Diabetes Maternal Grandmother    Hypertension Maternal Grandmother     Social History:  reports that she has never smoked. She has never used smokeless tobacco. She reports current alcohol use of about 3.0 standard drinks of alcohol per week. She reports that she does not use drugs.  Allergies:  Allergies  Allergen Reactions   Benazepril Swelling    Face and mouth swelling.   Ivp Dye [Iodinated Contrast Media] Anaphylaxis    Patient unsure of reaction but in case of reaction d/t shellfish allergy   Lidocaine Rash     02-01-2021: Patient had lidocaine patch applied to chest at sight of injury and she experienced a rash, whelping, and swelling. Resolved after removing the patch   Lyrica [Pregabalin] Swelling    Face/ mouth swelled up balloon   Shellfish Allergy Anaphylaxis and Swelling    Betadine on skin is okay   Shellfish-Derived Products Anaphylaxis   Trulicity [Dulaglutide] Swelling    Face and mouth swelled up   Ace Inhibitors Swelling   Medications Prior to Admission  Medication Sig Dispense Refill   atorvastatin (LIPITOR) 20 MG tablet Take 1 tablet (20 mg total) by mouth daily. TAKE 1 TABLET(20 MG) BY MOUTH DAILY 90 tablet 1   DULoxetine (CYMBALTA) 60 MG capsule Take 1 capsule (60 mg total) by mouth daily. Home med.     EPINEPHrine 0.3 mg/0.3 mL IJ SOAJ injection Inject 0.3 mg into the muscle as needed for anaphylaxis. 1 each 12   gabapentin (NEURONTIN) 300 MG capsule TAKE 2 CAPSULES(600 MG) BY MOUTH at BEDTIME and 1 CAPSULE (300MG ) In the AM and PM (Patient taking differently: Take 300 mg by mouth 3 (three) times daily. Takes 1 cap. In am; 1 in afternoon and 2 cap. At bedtime) 360 capsule 1   metoprolol tartrate (LOPRESSOR) 25 MG tablet Take 1 tablet (25 mg total) by mouth 2 (two) times daily. 60 tablet 2   omeprazole (PRILOSEC) 40 MG capsule TAKE 1 CAPSULE(40 MG) BY MOUTH BID 180 capsule 1   sucralfate (CARAFATE) 1 g tablet Take 1 tablet (1 g total) by mouth 4 (four) times daily as needed. Home med. 360 tablet 1   vitamin B-12 (CYANOCOBALAMIN) 1000 MCG tablet Take 1,000 mcg by mouth daily.     Accu-Chek Softclix Lancets lancets TEST TWICE DAILY 100 each 12   aspirin EC 81 MG tablet Take 81 mg by mouth daily. Swallow whole.     Cholecalciferol (VITAMIN D-3) 5000 units TABS Take 5,000 Units by mouth daily.     fexofenadine (ALLERGY RELIEF) 180 MG tablet Take 1 tablet (180 mg total) by mouth daily as needed. Home med. (Patient taking differently: Take 180 mg by mouth daily. Home med.)     magnesium  gluconate (MAGONATE) 500 MG tablet Take 500 mg by mouth 2 (two) times daily.     metFORMIN (GLUCOPHAGE) 500 MG tablet Take 2 tablets (1,000 mg total) by mouth 2 (two) times daily with a meal. 360 tablet 1   potassium chloride (KLOR-CON) 10 MEQ tablet Take 10 mEq by mouth 3 (three) times daily.     [DISCONTINUED] enoxaparin (LOVENOX) 40 MG/0.4ML injection Inject 0.4 mLs (40 mg total) into the skin daily for 14 days. 5.6 mL 0    Physical Exam: General: Alert and oriented.  No apparent distress. Splint dressing CDI since yesterday.  No strikethrough or drainage noted, patient able to feel toes with light touch and able to DF and PF toes right foot.  CFT intact to  digits right.  Results for orders placed or performed during the hospital encounter of 05/03/22 (from the past 48 hour(s))  Glucose, capillary     Status: Abnormal   Collection Time: 05/07/22  4:47 PM  Result Value Ref Range   Glucose-Capillary 170 (H) 70 - 99 mg/dL    Comment: Glucose reference range applies only to samples taken after fasting for at least 8 hours.  Glucose, capillary     Status: Abnormal   Collection Time: 05/07/22  9:36 PM  Result Value Ref Range   Glucose-Capillary 112 (H) 70 - 99 mg/dL    Comment: Glucose reference range applies only to samples taken after fasting for at least 8 hours.  Glucose, capillary     Status: Abnormal   Collection Time: 05/08/22  7:53 AM  Result Value Ref Range   Glucose-Capillary 110 (H) 70 - 99 mg/dL    Comment: Glucose reference range applies only to samples taken after fasting for at least 8 hours.  Glucose, capillary     Status: Abnormal   Collection Time: 05/08/22 12:02 PM  Result Value Ref Range   Glucose-Capillary 150 (H) 70 - 99 mg/dL    Comment: Glucose reference range applies only to samples taken after fasting for at least 8 hours.  Glucose, capillary     Status: Abnormal   Collection Time: 05/08/22  5:03 PM  Result Value Ref Range   Glucose-Capillary 230 (H) 70 - 99  mg/dL    Comment: Glucose reference range applies only to samples taken after fasting for at least 8 hours.   Comment 1 Document in Chart   Glucose, capillary     Status: Abnormal   Collection Time: 05/08/22  9:21 PM  Result Value Ref Range   Glucose-Capillary 63 (L) 70 - 99 mg/dL    Comment: Glucose reference range applies only to samples taken after fasting for at least 8 hours.  Glucose, capillary     Status: Abnormal   Collection Time: 05/08/22 10:22 PM  Result Value Ref Range   Glucose-Capillary 113 (H) 70 - 99 mg/dL    Comment: Glucose reference range applies only to samples taken after fasting for at least 8 hours.  Glucose, capillary     Status: Abnormal   Collection Time: 05/09/22  7:41 AM  Result Value Ref Range   Glucose-Capillary 116 (H) 70 - 99 mg/dL    Comment: Glucose reference range applies only to samples taken after fasting for at least 8 hours.  Glucose, capillary     Status: Abnormal   Collection Time: 05/09/22 12:07 PM  Result Value Ref Range   Glucose-Capillary 161 (H) 70 - 99 mg/dL    Comment: Glucose reference range applies only to samples taken after fasting for at least 8 hours.   No results found.  Blood pressure (!) 144/82, pulse 70, temperature 97.9 F (36.6 C), resp. rate 18, height 5\' 3"  (1.6 m), weight 68.5 kg, SpO2 99 %.  Assessment Right trimal ankle fracture s/p ORIF  DM2, controlled  Plan -Patient seen and examined. -Previous x-ray imaging reviewed and discussed with patient in detail. -Patient to keep dressing CDI until seen in outpatient clinic in hopefully around 1 week from today. -Continue NWB at all times to the RLE.  Appreciate PT/OT recs.  Rec for SNF/rehab. -Continue lovenox until ambulatory, likely 4-6 weeks. -Continue Augmentin 7d course until gone -Continue RICE therapy with use of tylenol/pain medication as needed.  Podiatry team to sign off at this time.  May be discharged after SNF/rehab is set up.  Rosetta Posner,  DPM 05/09/2022, 12:42 PM

## 2022-05-10 DIAGNOSIS — E1140 Type 2 diabetes mellitus with diabetic neuropathy, unspecified: Secondary | ICD-10-CM | POA: Diagnosis not present

## 2022-05-10 DIAGNOSIS — X58XXXA Exposure to other specified factors, initial encounter: Secondary | ICD-10-CM | POA: Diagnosis not present

## 2022-05-10 DIAGNOSIS — S82431A Displaced oblique fracture of shaft of right fibula, initial encounter for closed fracture: Secondary | ICD-10-CM | POA: Diagnosis not present

## 2022-05-10 DIAGNOSIS — E876 Hypokalemia: Secondary | ICD-10-CM | POA: Diagnosis not present

## 2022-05-10 DIAGNOSIS — I10 Essential (primary) hypertension: Secondary | ICD-10-CM | POA: Diagnosis not present

## 2022-05-10 DIAGNOSIS — Z79899 Other long term (current) drug therapy: Secondary | ICD-10-CM | POA: Diagnosis not present

## 2022-05-10 DIAGNOSIS — Z7984 Long term (current) use of oral hypoglycemic drugs: Secondary | ICD-10-CM | POA: Diagnosis not present

## 2022-05-10 DIAGNOSIS — S82851A Displaced trimalleolar fracture of right lower leg, initial encounter for closed fracture: Secondary | ICD-10-CM | POA: Diagnosis not present

## 2022-05-10 DIAGNOSIS — Z7982 Long term (current) use of aspirin: Secondary | ICD-10-CM | POA: Diagnosis not present

## 2022-05-10 LAB — CBC
HCT: 34 % — ABNORMAL LOW (ref 36.0–46.0)
Hemoglobin: 11 g/dL — ABNORMAL LOW (ref 12.0–15.0)
MCH: 28.5 pg (ref 26.0–34.0)
MCHC: 32.4 g/dL (ref 30.0–36.0)
MCV: 88.1 fL (ref 80.0–100.0)
Platelets: 340 10*3/uL (ref 150–400)
RBC: 3.86 MIL/uL — ABNORMAL LOW (ref 3.87–5.11)
RDW: 18.3 % — ABNORMAL HIGH (ref 11.5–15.5)
WBC: 5.5 10*3/uL (ref 4.0–10.5)
nRBC: 0 % (ref 0.0–0.2)

## 2022-05-10 LAB — GLUCOSE, CAPILLARY
Glucose-Capillary: 152 mg/dL — ABNORMAL HIGH (ref 70–99)
Glucose-Capillary: 137 mg/dL — ABNORMAL HIGH (ref 70–99)
Glucose-Capillary: 93 mg/dL (ref 70–99)
Glucose-Capillary: 186 mg/dL — ABNORMAL HIGH (ref 70–99)

## 2022-05-10 MED ORDER — SENNOSIDES-DOCUSATE SODIUM 8.6-50 MG PO TABS
1.0000 | ORAL_TABLET | Freq: Two times a day (BID) | ORAL | Status: DC
  Administered 2022-05-10 – 2022-05-12 (×5): 1 via ORAL
  Filled 2022-05-10 (×5): qty 1

## 2022-05-10 MED ORDER — ORAL CARE MOUTH RINSE: 15.0000 mL | OROMUCOSAL | Status: DC | PRN

## 2022-05-10 MED ORDER — MAGNESIUM HYDROXIDE 400 MG/5ML PO SUSP
15.0000 mL | Freq: Every day | ORAL | Status: DC | PRN
  Administered 2022-05-10: 15 mL via ORAL
  Filled 2022-05-10: qty 30

## 2022-05-10 NOTE — TOC Progression Note (Signed)
Transition of Care [facility]) - Progression Note    Patient Details  Name: Lisa Galvan MRN: [id number] Date of Birth: October 25, 1960  Transition of Care [facility] CM/SW [place], RN Phone Number: 05/10/2022, 8:48 AM  Clinical Narrative:   Insurance is still pending to go to [employer]     Expected Discharge Plan: [place] Barriers to Discharge: SNF Pending bed offer, Insurance Authorization  Expected Discharge Plan and Services                                               Social Determinants of Health (SDOH) Interventions SDOH Screenings   Food Insecurity: No Food Insecurity (05/04/2022)  Housing: Low Risk  (05/04/2022)  Transportation Needs: No Transportation Needs (05/04/2022)  Utilities: Not At Risk (05/04/2022)  Alcohol Screen: Low Risk  (12/21/2021)  Depression (PHQ2-9): Low Risk  (04/20/2022)  Financial Resource Strain: High Risk (01/02/2022)  Physical Activity: Inactive (12/14/2020)  Social Connections: Moderately Isolated (12/21/2021)  Stress: Stress Concern Present (12/21/2021)  Tobacco Use: Low Risk  (05/04/2022)    Readmission Risk Interventions     No data to display

## 2022-05-10 NOTE — Plan of Care (Signed)
  Problem: Activity: Goal: Risk for activity intolerance will decrease Outcome: Progressing   Problem: Nutrition: Goal: Adequate nutrition will be maintained Outcome: Progressing   Problem: Pain Managment: Goal: General experience of comfort will improve Outcome: Progressing   Problem: Safety: Goal: Ability to remain free from injury will improve Outcome: Progressing   

## 2022-05-10 NOTE — Progress Notes (Signed)
Physical Therapy Treatment Patient Details Name: Lisa Galvan MRN: [id number] DOB: 23-Dec-1960 Today's Date: 05/10/2022   History of Present Illness Pt is a 62 y.o. female with medical history significant of hypertension, hyperlipidemia, diabetes mellitus, GERD, depression, chronic pain syndrome, OSA not on CPAP, who presents for surgical repair of right ankle fracture.  Pt diagnosed with right trimalleolar ankle fracture and is s/p ORIF.    PT Comments    Pt was long sitting in bed upon arriving. She is A and O x 4. She is agreeable to session and pleasant throughout. Pt demonstrated safe abilities to exit bed, stand to RW and hop short distances. Once fatigued, she struggles to maintain NWB restrictions. Elected to ambulate with use of knee scooter once pt was unable to keep NWB. She was able to ambulate with scooter around RN station without less fatigue today. Continued recommendation for SNF at DC due to pt's limited available assistance at DC and recent fall history.    Recommendations for follow up therapy are one component of a multi-disciplinary discharge planning process, led by the attending physician.  Recommendations may be updated based on patient status, additional functional criteria and insurance authorization.  Follow Up Recommendations  Skilled nursing-short term rehab (<3 hours/day) (does not have available assistance at home)     Assistance Recommended at Discharge Intermittent Supervision/Assistance  Patient can return home with the following A little help with bathing/dressing/bathroom;Assistance with cooking/housework;Help with stairs or ramp for entrance;Assist for transportation;A little help with walking and/or transfers   Equipment Recommendations  Other (comment) (decreased)       Precautions / Restrictions Precautions Precautions: Fall Required Braces or Orthoses: Splint/Cast Splint/Cast: Right Ankle Restrictions Weight Bearing Restrictions: Yes RLE Weight  Bearing: Non weight bearing     Mobility  Bed Mobility Overal bed mobility: Modified Independent Bed Mobility: Supine to Sit, Sit to Supine  Supine to sit: Modified independent (Device/Increase time) Sit to supine: Modified independent (Device/Increase time)     Transfers Overall transfer level: Needs assistance Equipment used: Rolling walker (2 wheels) Transfers: Sit to/from Stand Sit to Stand: Min guard General transfer comment: pt stood from slightly elevated bed height with supervision however CGA from lower standard height surfaces.    Ambulation/Gait Ambulation/Gait assistance: Supervision Gait Distance (Feet): 160 Feet Assistive device:  (knee scooter) Gait Pattern/deviations: Step-to pattern Gait velocity: decreased     General Gait Details: pt demonstrated safe abilities to ambulate with use of knee scooter around the unit. She does have some safety concerns with getting on/off scooter. Was able to "hop" with RW a short distance but once fatigued is unable to maintain NWB after short distances.   Balance Overall balance assessment: Needs assistance Sitting-balance support: Feet unsupported Sitting balance-Leahy Scale: Normal     Standing balance support: Bilateral upper extremity supported, During functional activity, Reliant on assistive device for balance Standing balance-Leahy Scale: Fair Standing balance comment: hx of falls. Higher likihood of falls with NWB restrictions       Cognition Arousal/Alertness: Awake/alert Behavior During Therapy: WFL for tasks assessed/performed Overall Cognitive Status: Within Functional Limits for tasks assessed   General Comments: pt was A and O x 4               Pertinent Vitals/Pain Pain Assessment Pain Assessment: 0-10 Pain Score: 3  Faces Pain Scale: Hurts a little bit Pain Location: R ankle Pain Descriptors / Indicators: Sore Pain Intervention(s): Limited activity within patient's tolerance, Monitored during  session, Premedicated before session, Repositioned  PT Goals (current goals can now be found in the care plan section) Acute Rehab PT Goals Patient Stated Goal: rehab then home Progress towards PT goals: Progressing toward goals    Frequency    BID      PT Plan Current plan remains appropriate       AM-PAC PT "6 Clicks" Mobility   Outcome Measure  Help needed turning from your back to your side while in a flat bed without using bedrails?: None Help needed moving from lying on your back to sitting on the side of a flat bed without using bedrails?: None Help needed moving to and from a bed to a chair (including a wheelchair)?: A Little Help needed standing up from a chair using your arms (e.g., wheelchair or bedside chair)?: A Little Help needed to walk in hospital room?: A Little Help needed climbing 3-5 steps with a railing? : Total 6 Click Score: 18    End of Session   Activity Tolerance: Patient tolerated treatment well Patient left: in chair;with call bell/phone within reach;with SCD's reapplied;with family/visitor present Nurse Communication: Mobility status;Weight bearing status PT Visit Diagnosis: Unsteadiness on feet (R26.81);Other abnormalities of gait and mobility (R26.89);History of falling (Z91.81);Muscle weakness (generalized) (M62.81);Pain Pain - Right/Left: Right Pain - part of body: Ankle and joints of foot     Time: 5809-9833 PT Time Calculation (min) (ACUTE ONLY): 24 min  Charges:  $Gait Training: 8-22 mins $Therapeutic Activity: 8-22 mins           Julaine Fusi PTA 05/10/22, 11:43 AM

## 2022-05-10 NOTE — Progress Notes (Signed)
PROGRESS NOTE  Lisa Galvan UJW:[id number] DOB: 12/04/1960 DOA: 05/03/2022 PCP: [name], DO  Hospital Course/Subjective:  Lisa Galvan is a 62 y.o. female with medical history significant of hypertension, hyperlipidemia, diabetes mellitus, GERD, depression, chronic pain syndrome, OSA not on CPAP, who presents with right ankle pain, apparently patient injured her right ankle 2 weeks ago and was found to have closed displaced trimalleolar fracture of right ankle. Pt underwent rIGHT TRIMALLEOLAR ANKLE FRACTURE ORIF by Dr. Luana Shu on the day of admission.   Sitting up in chair on the phone this AM, doing well awaiting SNF placement.   Assessment/Plan:  Principal Problem:   Closed displaced trimalleolar fracture of right ankle Active Problems:   Type 2 diabetes mellitus with diabetic neuropathy, unspecified (HCC)   Hyperlipidemia   Essential hypertension   OSA (obstructive sleep apnea)   Hypokalemia   Depression   Assessment and Plan: * Closed displaced trimalleolar fracture of right ankle S/p ORIF by podiatry. PT/OT recommending SNF, she is NWB of the right ankle -Continue with pain management - DPM recommend Lovenox daily until ambulatory (noted 4-6 weeks) - they also recommend completing 7 days of Augmentin which will end on 1/19  Depression -Continue home Cymbalta  Hypokalemia Hypomagnesemia.  Potassium improved with repletion.  Magnesium at 1.3. -Replete magnesium -Continue to monitor  OSA (obstructive sleep apnea) Not using CPAP currently  Essential hypertension -Continue with home metoprolol -As needed hydralazine  Hyperlipidemia -Continue Lipitor  Type 2 diabetes mellitus with diabetic neuropathy, unspecified (HCC) CBG within goal with 1 hypoglycemic episode with CBG at 56 last night, recent A1c of 7.2.  Patient was on metformin at home. -Hold Semglee -Continue mealtime coverage-if eating more than 50% of meal, reduced to 0-6 unit scale. -Continue with  SSI  DVT Prophylaxis: Lovenox  Code Status: Full Code   Family Communication: None present, patient is AAOx4.   Disposition Plan: SNF   Consultants: DPM   Procedures: ORIF on 1/11   Antimicrobials: Anti-infectives (From admission, onward)    Start     Dose/Rate Route Frequency Ordered Stop   05/04/22 1000  amoxicillin-clavulanate (AUGMENTIN) 875-125 MG per tablet 1 tablet        1 tablet Oral Every 12 hours 05/04/22 0825 05/11/22 0959   05/03/22 1146  ceFAZolin (ANCEF) 2-4 GM/100ML-% IVPB       Note to Pharmacy: Olena Mater F: cabinet override      05/03/22 1146 05/03/22 1324   05/03/22 1145  ceFAZolin (ANCEF) IVPB 2g/100 mL premix        2 g 200 mL/hr over 30 Minutes Intravenous On call to O.R. 05/03/22 1141 05/03/22 1331       Objective: Vitals:   05/09/22 1637 05/10/22 0005 05/10/22 0819 05/10/22 0940  BP: (!) 140/84 133/82 (!) 148/88   Pulse: 68 71 (!) 59 77  Resp: 18 20 18    Temp: 97.6 F (36.4 C) 98.7 F (37.1 C) 98.2 F (36.8 C)   TempSrc: Oral     SpO2: 98% 97% 99%   Weight:      Height:       No intake or output data in the 24 hours ending 05/10/22 1004  Filed Weights   05/03/22 1207  Weight: 68.5 kg   Exam: General:  Alert, oriented, calm, in no acute distress, up in chair this AM  Eyes: EOMI, clear conjuctivae, white sclerea Neck: supple, no masses, trachea mildline  Cardiovascular: RRR, no murmurs or rubs, no peripheral edema  Respiratory: clear  to auscultation bilaterally, no wheezes, no crackles  Abdomen: soft, nontender, nondistended, normal bowel tones heard  Skin: dry, no rashes  Musculoskeletal: no joint effusions, normal range of motion  Psychiatric: appropriate affect, normal speech  Neurologic: extraocular muscles intact, clear speech, moving all extremities with intact sensorium   Data Reviewed: CBC: Recent Labs  Lab 05/03/22 1231 05/04/22 0510 05/10/22 0453  WBC  --  5.6 5.5  HGB 12.2 11.3* 11.0*  HCT 36.0 34.3*  34.0*  MCV  --  84.7 88.1  PLT  --  411* 340    Basic Metabolic Panel: Recent Labs  Lab 05/03/22 1231 05/03/22 1736 05/04/22 0510 05/05/22 0639  NA 137  --  135 136  K 3.4*  --  4.2 3.6  CL 98  --  102 104  CO2  --   --  23 27  GLUCOSE 89  --  248* 123*  BUN <3*  --  7* 6*  CREATININE 0.60  --  0.90 0.79  CALCIUM  --   --  8.4* 8.7*  MG  --  1.3* 1.3* 2.1    GFR: Estimated Creatinine Clearance: 66.8 mL/min (by C-G formula based on SCr of 0.79 mg/dL). Liver Function Tests: No results for input(s): "AST", "ALT", "ALKPHOS", "BILITOT", "PROT", "ALBUMIN" in the last 168 hours. No results for input(s): "LIPASE", "AMYLASE" in the last 168 hours. No results for input(s): "AMMONIA" in the last 168 hours. Coagulation Profile: No results for input(s): "INR", "PROTIME" in the last 168 hours. Cardiac Enzymes: No results for input(s): "CKTOTAL", "CKMB", "CKMBINDEX", "TROPONINI" in the last 168 hours. BNP (last 3 results) No results for input(s): "PROBNP" in the last 8760 hours. HbA1C: No results for input(s): "HGBA1C" in the last 72 hours. CBG: Recent Labs  Lab 05/09/22 0741 05/09/22 1207 05/09/22 1637 05/09/22 2042 05/10/22 0735  GLUCAP 116* 161* 177* 140* 152*    Lipid Profile: No results for input(s): "CHOL", "HDL", "LDLCALC", "TRIG", "CHOLHDL", "LDLDIRECT" in the last 72 hours. Thyroid Function Tests: No results for input(s): "TSH", "T4TOTAL", "FREET4", "T3FREE", "THYROIDAB" in the last 72 hours. Anemia Panel: No results for input(s): "VITAMINB12", "FOLATE", "FERRITIN", "TIBC", "IRON", "RETICCTPCT" in the last 72 hours. Urine analysis:    Component Value Date/Time   COLORURINE YELLOW (A) 12/11/2021 0925   APPEARANCEUR HAZY (A) 12/11/2021 0925   APPEARANCEUR Clear 03/06/2021 0944   LABSPEC 1.011 12/11/2021 0925   PHURINE 5.0 12/11/2021 0925   GLUCOSEU NEGATIVE 12/11/2021 0925   HGBUR NEGATIVE 12/11/2021 0925   BILIRUBINUR NEGATIVE 12/11/2021 0925   BILIRUBINUR  Negative 03/06/2021 0944   KETONESUR NEGATIVE 12/11/2021 0925   PROTEINUR NEGATIVE 12/11/2021 0925   NITRITE NEGATIVE 12/11/2021 0925   LEUKOCYTESUR SMALL (A) 12/11/2021 0925   Sepsis Labs: @LABRCNTIP (procalcitonin:4,lacticidven:4)  )No results found for this or any previous visit (from the past 240 hour(s)).   Studies: No results found.  Scheduled Meds:  amoxicillin-clavulanate  1 tablet Oral Q12H   aspirin EC  81 mg Oral Daily   atorvastatin  20 mg Oral Daily   cholecalciferol  5,000 Units Oral Daily   cyanocobalamin  1,000 mcg Oral Daily   DULoxetine  60 mg Oral Daily   enoxaparin  40 mg Subcutaneous Q24H   gabapentin  300 mg Oral BID   gabapentin  600 mg Oral QHS   insulin aspart  0-5 Units Subcutaneous QHS   insulin aspart  0-6 Units Subcutaneous TID WC   insulin aspart  3 Units Subcutaneous TID WC   loratadine  10 mg Oral Daily   magnesium gluconate  500 mg Oral BID   metoprolol tartrate  25 mg Oral BID   pantoprazole  40 mg Oral Daily   senna-docusate  1 tablet Oral BID    Continuous Infusions:   LOS: 1 day   Time spent: 26 minutes  Tykiera Raven Marry Guan, MD [facility] Pager [phone]  If 7PM-7AM, please contact night-coverage [email or web address] Password [facility] 05/10/2022, 10:04 AM

## 2022-05-11 DIAGNOSIS — S82431A Displaced oblique fracture of shaft of right fibula, initial encounter for closed fracture: Secondary | ICD-10-CM | POA: Diagnosis not present

## 2022-05-11 DIAGNOSIS — Z79899 Other long term (current) drug therapy: Secondary | ICD-10-CM | POA: Diagnosis not present

## 2022-05-11 DIAGNOSIS — S82851A Displaced trimalleolar fracture of right lower leg, initial encounter for closed fracture: Secondary | ICD-10-CM | POA: Diagnosis not present

## 2022-05-11 DIAGNOSIS — X58XXXA Exposure to other specified factors, initial encounter: Secondary | ICD-10-CM | POA: Diagnosis not present

## 2022-05-11 DIAGNOSIS — E1140 Type 2 diabetes mellitus with diabetic neuropathy, unspecified: Secondary | ICD-10-CM | POA: Diagnosis not present

## 2022-05-11 DIAGNOSIS — I10 Essential (primary) hypertension: Secondary | ICD-10-CM | POA: Diagnosis not present

## 2022-05-11 DIAGNOSIS — E876 Hypokalemia: Secondary | ICD-10-CM | POA: Diagnosis not present

## 2022-05-11 DIAGNOSIS — Z7982 Long term (current) use of aspirin: Secondary | ICD-10-CM | POA: Diagnosis not present

## 2022-05-11 DIAGNOSIS — Z7984 Long term (current) use of oral hypoglycemic drugs: Secondary | ICD-10-CM | POA: Diagnosis not present

## 2022-05-11 LAB — GLUCOSE, CAPILLARY
Glucose-Capillary: 113 mg/dL — ABNORMAL HIGH (ref 70–99)
Glucose-Capillary: 87 mg/dL (ref 70–99)
Glucose-Capillary: 187 mg/dL — ABNORMAL HIGH (ref 70–99)
Glucose-Capillary: 114 mg/dL — ABNORMAL HIGH (ref 70–99)

## 2022-05-11 NOTE — Progress Notes (Signed)
PROGRESS NOTE  Demitria Hay WGN:[id number] DOB: 06-27-1960 DOA: 05/03/2022 PCP: Dorcas Carrow, DO  Hospital Course/Subjective:  Lisa Galvan is a 62 y.o. female with medical history significant of hypertension, hyperlipidemia, diabetes mellitus, GERD, depression, chronic pain syndrome, OSA not on CPAP, who presents with right ankle pain, apparently patient injured her right ankle 2 weeks ago and was found to have closed displaced trimalleolar fracture of right ankle. Pt underwent rIGHT TRIMALLEOLAR ANKLE FRACTURE ORIF by Dr. Excell Seltzer on the day of admission.   Seen this morning with her daughter at the bedside, awaiting insurance authorization.  Patient is doing well and has no complaints.  Assessment/Plan:  Principal Problem:   Closed displaced trimalleolar fracture of right ankle Active Problems:   Type 2 diabetes mellitus with diabetic neuropathy, unspecified (HCC)   Hyperlipidemia   Essential hypertension   OSA (obstructive sleep apnea)   Hypokalemia   Depression   Assessment and Plan: * Closed displaced trimalleolar fracture of right ankle S/p ORIF by podiatry. PT/OT recommending SNF, she is NWB of the right ankle -Continue with pain management - DPM recommend Lovenox daily until ambulatory (noted 4-6 weeks) - they also recommend completing 7 days of Augmentin which will end today 1/19  Depression -Continue home Cymbalta  Hypokalemia Hypomagnesemia.  Potassium improved with repletion.  Magnesium at 1.3. -Replete magnesium -Continue to monitor  OSA (obstructive sleep apnea) Not using CPAP currently  Essential hypertension -Continue with home metoprolol -As needed hydralazine  Hyperlipidemia -Continue Lipitor  Type 2 diabetes mellitus with diabetic neuropathy, unspecified (HCC) CBG within goal with 1 hypoglycemic episode with CBG at 58 last night, recent A1c of 7.2.  Patient was on metformin at home. -Hold Semglee -Continue mealtime coverage-if eating more than  50% of meal, reduced to 0-6 unit scale. -Continue with SSI  DVT Prophylaxis: Lovenox  Code Status: Full Code   Family Communication: Discussed with patient and daughter at the bedside this morning Disposition Plan: SNF   Consultants: DPM   Procedures: ORIF on 1/11   Antimicrobials: Anti-infectives (From admission, onward)    Start     Dose/Rate Route Frequency Ordered Stop   05/04/22 1000  amoxicillin-clavulanate (AUGMENTIN) 875-125 MG per tablet 1 tablet        1 tablet Oral Every 12 hours 05/04/22 0825 05/10/22 2101   05/03/22 1146  ceFAZolin (ANCEF) 2-4 GM/100ML-% IVPB       Note to Pharmacy: Brain Hilts F: cabinet override      05/03/22 1146 05/03/22 1324   05/03/22 1145  ceFAZolin (ANCEF) IVPB 2g/100 mL premix        2 g 200 mL/hr over 30 Minutes Intravenous On call to O.R. 05/03/22 1141 05/03/22 1331       Objective: Vitals:   05/10/22 0940 05/10/22 1549 05/11/22 0023 05/11/22 0812  BP:  105/71 125/79 (!) 161/99  Pulse: 77 67 64 65  Resp:  18 17 17   Temp:  (!) 97.4 F (36.3 C) 97.6 F (36.4 C) 98.4 F (36.9 C)  TempSrc:      SpO2:  97% 94% 96%  Weight:      Height:        Intake/Output Summary (Last 24 hours) at 05/11/2022 1238 Last data filed at 05/10/2022 1839 Gross per 24 hour  Intake 840 ml  Output --  Net 840 ml    Filed Weights   05/03/22 1207  Weight: 68.5 kg   Exam: General:  Alert, oriented, calm, in no acute distress, sitting up in  bed, with daughter at the bedside Eyes: EOMI, clear conjuctivae, white sclerea Neck: supple, no masses, trachea mildline  Cardiovascular: RRR, no murmurs or rubs, no peripheral edema  Respiratory: clear to auscultation bilaterally, no wheezes, no crackles  Abdomen: soft, nontender, nondistended, normal bowel tones heard  Skin: dry, no rashes  Musculoskeletal: no joint effusions, normal range of motion  Psychiatric: appropriate affect, normal speech  Neurologic: extraocular muscles intact, clear speech,  moving all extremities with intact sensorium    Data Reviewed: CBC: Recent Labs  Lab 05/10/22 0453  WBC 5.5  HGB 11.0*  HCT 34.0*  MCV 88.1  PLT 623    Basic Metabolic Panel: Recent Labs  Lab 05/05/22 0639  NA 136  K 3.6  CL 104  CO2 27  GLUCOSE 123*  BUN 6*  CREATININE 0.79  CALCIUM 8.7*  MG 2.1    GFR: Estimated Creatinine Clearance: 66.8 mL/min (by C-G formula based on SCr of 0.79 mg/dL). Liver Function Tests: No results for input(s): "AST", "ALT", "ALKPHOS", "BILITOT", "PROT", "ALBUMIN" in the last 168 hours. No results for input(s): "LIPASE", "AMYLASE" in the last 168 hours. No results for input(s): "AMMONIA" in the last 168 hours. Coagulation Profile: No results for input(s): "INR", "PROTIME" in the last 168 hours. Cardiac Enzymes: No results for input(s): "CKTOTAL", "CKMB", "CKMBINDEX", "TROPONINI" in the last 168 hours. BNP (last 3 results) No results for input(s): "PROBNP" in the last 8760 hours. HbA1C: No results for input(s): "HGBA1C" in the last 72 hours. CBG: Recent Labs  Lab 05/10/22 1205 05/10/22 1638 05/10/22 2026 05/11/22 0815 05/11/22 1225  GLUCAP 137* 93 186* 113* 87    Lipid Profile: No results for input(s): "CHOL", "HDL", "LDLCALC", "TRIG", "CHOLHDL", "LDLDIRECT" in the last 72 hours. Thyroid Function Tests: No results for input(s): "TSH", "T4TOTAL", "FREET4", "T3FREE", "THYROIDAB" in the last 72 hours. Anemia Panel: No results for input(s): "VITAMINB12", "FOLATE", "FERRITIN", "TIBC", "IRON", "RETICCTPCT" in the last 72 hours. Urine analysis:    Component Value Date/Time   COLORURINE YELLOW (A) 12/11/2021 0925   APPEARANCEUR HAZY (A) 12/11/2021 0925   APPEARANCEUR Clear 03/06/2021 0944   LABSPEC 1.011 12/11/2021 0925   PHURINE 5.0 12/11/2021 0925   GLUCOSEU NEGATIVE 12/11/2021 0925   HGBUR NEGATIVE 12/11/2021 0925   BILIRUBINUR NEGATIVE 12/11/2021 0925   BILIRUBINUR Negative 03/06/2021 [place] 12/11/2021  0925   PROTEINUR NEGATIVE 12/11/2021 0925   NITRITE NEGATIVE 12/11/2021 0925   LEUKOCYTESUR SMALL (A) 12/11/2021 0925   Sepsis Labs: @LABRCNTIP (procalcitonin:4,lacticidven:4)  )No results found for this or any previous visit (from the past 240 hour(s)).   Studies: No results found.  Scheduled Meds:  aspirin EC  81 mg Oral Daily   atorvastatin  20 mg Oral Daily   cholecalciferol  5,000 Units Oral Daily   cyanocobalamin  1,000 mcg Oral Daily   DULoxetine  60 mg Oral Daily   enoxaparin  40 mg Subcutaneous Q24H   gabapentin  300 mg Oral BID   gabapentin  600 mg Oral QHS   insulin aspart  0-5 Units Subcutaneous QHS   insulin aspart  0-6 Units Subcutaneous TID WC   insulin aspart  3 Units Subcutaneous TID WC   loratadine  10 mg Oral Daily   magnesium gluconate  500 mg Oral BID   metoprolol tartrate  25 mg Oral BID   pantoprazole  40 mg Oral Daily   senna-docusate  1 tablet Oral BID    Continuous Infusions:   LOS: 1 day   Time  spent: 23 minutes  Bless Belshe Marry Guan, MD [facility] Pager [phone]  If 7PM-7AM, please contact night-coverage [email or web address] Password [facility] 05/11/2022, 12:38 PM

## 2022-05-11 NOTE — TOC Progression Note (Signed)
Transition of Care [facility]) - Progression Note    Patient Details  Name: Lisa Galvan MRN: [id number] Date of Birth: Jan 02, 1961  Transition of Care [facility]) CM/SW [place], RN Phone Number: 05/11/2022, 4:33 PM  Clinical Narrative:   Patient has Ins approval, Can DC Sat to [facility], Fax DC summary and orders to [phone], MUST Get to [facility] no later than 2 PM Call [facility] main number for report and ask to speak to Gevena Barre, Call [place] at [phone],     Expected Discharge Plan: [place] Barriers to Discharge: SNF Pending bed offer, Insurance Authorization  Expected Discharge Plan and Services                                               Social Determinants of Health (SDOH) Interventions SDOH Screenings   Food Insecurity: No Food Insecurity (05/04/2022)  Housing: Low Risk  (05/04/2022)  Transportation Needs: No Transportation Needs (05/04/2022)  Utilities: Not At Risk (05/04/2022)  Alcohol Screen: Low Risk  (12/21/2021)  Depression (PHQ2-9): Low Risk  (04/20/2022)  Financial Resource Strain: High Risk (01/02/2022)  Physical Activity: Inactive (12/14/2020)  Social Connections: Moderately Isolated (12/21/2021)  Stress: Stress Concern Present (12/21/2021)  Tobacco Use: Low Risk  (05/04/2022)    Readmission Risk Interventions     No data to display

## 2022-05-11 NOTE — Plan of Care (Signed)
  Problem: Nutritional: Goal: Maintenance of adequate nutrition will improve Outcome: Progressing   Problem: Health Behavior/Discharge Planning: Goal: Ability to manage health-related needs will improve Outcome: Progressing   Problem: Clinical Measurements: Goal: Diagnostic test results will improve Outcome: Progressing   Problem: Activity: Goal: Risk for activity intolerance will decrease Outcome: Progressing   Problem: Elimination: Goal: Will not experience complications related to bowel motility Outcome: Progressing Goal: Will not experience complications related to urinary retention Outcome: Progressing   Problem: Pain Managment: Goal: General experience of comfort will improve Outcome: Progressing

## 2022-05-11 NOTE — TOC Progression Note (Signed)
Transition of Care [facility]) - Progression Note    Patient Details  Name: Lisa Galvan MRN: [id number] Date of Birth: 1960/08/31  Transition of Care [facility]) CM/SW [place], RN Phone Number: 05/11/2022, 1:55 PM  Clinical Narrative:    Called [facility] and inquired about the ins auth he stated that he called and they had received clinical documents and have expedited it, he will let me know once they get approval   Expected Discharge Plan: Skilled Nursing Facility Barriers to Discharge: SNF Pending bed offer, Insurance Authorization  Expected Discharge Plan and Services                                               Social Determinants of Health (SDOH) Interventions SDOH Screenings   Food Insecurity: No Food Insecurity (05/04/2022)  Housing: Low Risk  (05/04/2022)  Transportation Needs: No Transportation Needs (05/04/2022)  Utilities: Not At Risk (05/04/2022)  Alcohol Screen: Low Risk  (12/21/2021)  Depression (PHQ2-9): Low Risk  (04/20/2022)  Financial Resource Strain: High Risk (01/02/2022)  Physical Activity: Inactive (12/14/2020)  Social Connections: Moderately Isolated (12/21/2021)  Stress: Stress Concern Present (12/21/2021)  Tobacco Use: Low Risk  (05/04/2022)    Readmission Risk Interventions     No data to display

## 2022-05-11 NOTE — Progress Notes (Signed)
PT Cancellation Note  Patient Details Name: Lisa Galvan MRN: [id number] DOB: 03/22/1961   Cancelled Treatment:    Reason Eval/Treat Not Completed: Other (comment). Chart reviewed prior to entry. Upon entry to room pt updated primary PT had to leave due to illness. Pt pleasantly declining PT session with Pryor Curia despite encouragement reporting she plans on taking a shower with her daughter supporting her. Will re-attempt tomorrow following POC.    Salem Caster. Fairly IV, PT, DPT Physical Therapist- [facility]  05/11/2022, 3:20 PM

## 2022-05-11 NOTE — TOC Progression Note (Signed)
Transition of Care [facility]) - Progression Note    Patient Details  Name: Lisa Galvan MRN: [id number] Date of Birth: 1960-10-18  Transition of Care [facility]) CM/SW [place], RN Phone Number: 05/11/2022, 10:42 AM  Clinical Narrative:    Insurance pending to go to [facility]   Expected Discharge Plan: [place] Barriers to Discharge: SNF Pending bed offer, Insurance Authorization  Expected Discharge Plan and Services                                               Social Determinants of Health (SDOH) Interventions SDOH Screenings   Food Insecurity: No Food Insecurity (05/04/2022)  Housing: Low Risk  (05/04/2022)  Transportation Needs: No Transportation Needs (05/04/2022)  Utilities: Not At Risk (05/04/2022)  Alcohol Screen: Low Risk  (12/21/2021)  Depression (PHQ2-9): Low Risk  (04/20/2022)  Financial Resource Strain: High Risk (01/02/2022)  Physical Activity: Inactive (12/14/2020)  Social Connections: Moderately Isolated (12/21/2021)  Stress: Stress Concern Present (12/21/2021)  Tobacco Use: Low Risk  (05/04/2022)    Readmission Risk Interventions     No data to display

## 2022-05-12 DIAGNOSIS — S82891D Other fracture of right lower leg, subsequent encounter for closed fracture with routine healing: Secondary | ICD-10-CM | POA: Diagnosis not present

## 2022-05-12 DIAGNOSIS — Z7901 Long term (current) use of anticoagulants: Secondary | ICD-10-CM | POA: Diagnosis not present

## 2022-05-12 DIAGNOSIS — G894 Chronic pain syndrome: Secondary | ICD-10-CM | POA: Diagnosis not present

## 2022-05-12 DIAGNOSIS — E785 Hyperlipidemia, unspecified: Secondary | ICD-10-CM | POA: Diagnosis not present

## 2022-05-12 DIAGNOSIS — Z7401 Bed confinement status: Secondary | ICD-10-CM | POA: Diagnosis not present

## 2022-05-12 DIAGNOSIS — Z7982 Long term (current) use of aspirin: Secondary | ICD-10-CM | POA: Diagnosis not present

## 2022-05-12 DIAGNOSIS — R2689 Other abnormalities of gait and mobility: Secondary | ICD-10-CM | POA: Diagnosis not present

## 2022-05-12 DIAGNOSIS — R296 Repeated falls: Secondary | ICD-10-CM | POA: Diagnosis not present

## 2022-05-12 DIAGNOSIS — S82851A Displaced trimalleolar fracture of right lower leg, initial encounter for closed fracture: Secondary | ICD-10-CM | POA: Diagnosis not present

## 2022-05-12 DIAGNOSIS — S82431A Displaced oblique fracture of shaft of right fibula, initial encounter for closed fracture: Secondary | ICD-10-CM | POA: Diagnosis not present

## 2022-05-12 DIAGNOSIS — R2681 Unsteadiness on feet: Secondary | ICD-10-CM | POA: Diagnosis not present

## 2022-05-12 DIAGNOSIS — G4733 Obstructive sleep apnea (adult) (pediatric): Secondary | ICD-10-CM | POA: Diagnosis not present

## 2022-05-12 DIAGNOSIS — Z79899 Other long term (current) drug therapy: Secondary | ICD-10-CM | POA: Diagnosis not present

## 2022-05-12 DIAGNOSIS — R29898 Other symptoms and signs involving the musculoskeletal system: Secondary | ICD-10-CM | POA: Diagnosis not present

## 2022-05-12 DIAGNOSIS — E876 Hypokalemia: Secondary | ICD-10-CM | POA: Diagnosis not present

## 2022-05-12 DIAGNOSIS — E1142 Type 2 diabetes mellitus with diabetic polyneuropathy: Secondary | ICD-10-CM | POA: Diagnosis not present

## 2022-05-12 DIAGNOSIS — E1140 Type 2 diabetes mellitus with diabetic neuropathy, unspecified: Secondary | ICD-10-CM | POA: Diagnosis not present

## 2022-05-12 DIAGNOSIS — Z9181 History of falling: Secondary | ICD-10-CM | POA: Diagnosis not present

## 2022-05-12 DIAGNOSIS — S82851D Displaced trimalleolar fracture of right lower leg, subsequent encounter for closed fracture with routine healing: Secondary | ICD-10-CM | POA: Diagnosis not present

## 2022-05-12 DIAGNOSIS — M6281 Muscle weakness (generalized): Secondary | ICD-10-CM | POA: Diagnosis not present

## 2022-05-12 DIAGNOSIS — Z7984 Long term (current) use of oral hypoglycemic drugs: Secondary | ICD-10-CM | POA: Diagnosis not present

## 2022-05-12 DIAGNOSIS — I10 Essential (primary) hypertension: Secondary | ICD-10-CM | POA: Diagnosis not present

## 2022-05-12 DIAGNOSIS — Z4789 Encounter for other orthopedic aftercare: Secondary | ICD-10-CM | POA: Diagnosis not present

## 2022-05-12 DIAGNOSIS — X58XXXA Exposure to other specified factors, initial encounter: Secondary | ICD-10-CM | POA: Diagnosis not present

## 2022-05-12 DIAGNOSIS — M25571 Pain in right ankle and joints of right foot: Secondary | ICD-10-CM | POA: Diagnosis not present

## 2022-05-12 DIAGNOSIS — F324 Major depressive disorder, single episode, in partial remission: Secondary | ICD-10-CM | POA: Diagnosis not present

## 2022-05-12 LAB — GLUCOSE, CAPILLARY: Glucose-Capillary: 132 mg/dL — ABNORMAL HIGH (ref 70–99)

## 2022-05-12 NOTE — Discharge Summary (Signed)
Discharge Summary  Lisa Galvan PFX:[id number] DOB: 1960/08/14  PCP: [name], DO  Admit date: 05/03/2022 Discharge date: 05/12/2022  Recommendations for Outpatient Follow-up:  Please follow up with your PCP with CBC and BMP in 1-2 weeks Follow-up with Dr. Luana Shu of podiatry in the next 3 to 4 weeks.  Discharge Diagnoses:  Active Hospital Problems   Diagnosis Date Noted   Closed displaced trimalleolar fracture of right ankle 05/03/2022    Priority: 1.   Hypokalemia 05/03/2022   Depression 05/03/2022   OSA (obstructive sleep apnea) 05/23/2020   Essential hypertension 10/06/2018   Type 2 diabetes mellitus with diabetic neuropathy, unspecified ([place]) 12/10/2014   Hyperlipidemia 12/10/2014    Resolved Hospital Problems  No resolved problems to display.   Discharge Condition: Stable   Diet recommendation: Diet Orders (From admission, onward)     Start     Ordered   05/04/22 1221  Diet Carb Modified Fluid consistency: Thin; Room service appropriate? Yes  Diet effective now       Question Answer Comment  Diet-HS Snack? Nothing   Calorie Level Medium 1600-2000   Fluid consistency: Thin   Room service appropriate? Yes      05/04/22 1220           HPI and Brief Hospital Course:  Lisa Galvan is a 62 y.o. female with medical history significant of hypertension, hyperlipidemia, diabetes mellitus, GERD, depression, chronic pain syndrome, OSA not on CPAP, who presents with right ankle pain, apparently patient injured her right ankle 2 weeks ago and was found to have closed displaced trimalleolar fracture of right ankle. Pt underwent Right TRIMALLEOLAR ANKLE FRACTURE ORIF by Dr. Luana Shu on the day of admission.  Since then, she has been doing well awaiting rehab placement, has been accepted to [facility] and is discharging there today.  While in the hospital, she completed a course of recommended 7 days of Augmentin on 1/19.  Per podiatry, they recommend continuing Lovenox daily until  ambulatory in approximately 4 to 6 weeks.  Procedures: Right trimalleolar ankle fracture ORIF with Dr. Luana Shu 05/03/2022  Consultations: Podiatry  Discharge details, plan of care and follow up instructions were discussed with patient and any available family or care providers. Patient and family are in agreement with discharge from the hospital today and all questions were answered to their satisfaction.  Discharge Exam: BP 115/67   Pulse 76   Temp 98 F (36.7 C)   Resp 16   Ht 5\' 3"  (1.6 m)   Wt 68.5 kg   SpO2 100%   BMI 26.75 kg/m  General:  Alert, oriented, calm, in no acute distress  Eyes: EOMI, clear sclerea Neck: supple, no masses, trachea mildline  Cardiovascular: RRR, no murmurs or rubs, no peripheral edema  Respiratory: clear to auscultation bilaterally, no wheezes, no crackles  Abdomen: soft, nontender, nondistended, normal bowel tones heard  Skin: dry, no rashes  Musculoskeletal: no joint effusions, normal range of motion  Psychiatric: appropriate affect, normal speech  Neurologic: extraocular muscles intact, clear speech, moving all extremities with intact sensorium   Discharge Instructions You were cared for by a hospitalist during your hospital stay. If you have any questions about your discharge medications or the care you received while you were in the hospital after you are discharged, you can call the unit and asked to speak with the hospitalist on call if the hospitalist that took care of you is not available. Once you are discharged, your primary care physician will handle any  further medical issues. Please note that NO REFILLS for any discharge medications will be authorized once you are discharged, as it is imperative that you return to your primary care physician (or establish a relationship with a primary care physician if you do not have one) for your aftercare needs so that they can reassess your need for medications and monitor your lab values.   Allergies  as of 05/12/2022       Reactions   Benazepril Swelling   Face and mouth swelling.   Ivp Dye [iodinated Contrast Media] Anaphylaxis   Patient unsure of reaction but in case of reaction d/t shellfish allergy   Lidocaine Rash   02-01-2021: Patient had lidocaine patch applied to chest at sight of injury and she experienced a rash, whelping, and swelling. Resolved after removing the patch   Lyrica [pregabalin] Swelling   Face/ mouth swelled up balloon   Shellfish Allergy Anaphylaxis, Swelling   Betadine on skin is okay   Shellfish-derived Products Anaphylaxis   Trulicity [dulaglutide] Swelling   Face and mouth swelled up   Ace Inhibitors Swelling        Medication List     STOP taking these medications    potassium chloride 10 MEQ tablet Commonly known as: KLOR-CON       TAKE these medications    Accu-Chek Softclix Lancets lancets TEST TWICE DAILY   aspirin EC 81 MG tablet Take 81 mg by mouth daily. Swallow whole.   atorvastatin 20 MG tablet Commonly known as: LIPITOR Take 1 tablet (20 mg total) by mouth daily. TAKE 1 TABLET(20 MG) BY MOUTH DAILY   cyanocobalamin 1000 MCG tablet Commonly known as: VITAMIN B12 Take 1,000 mcg by mouth daily.   DULoxetine 60 MG capsule Commonly known as: CYMBALTA Take 1 capsule (60 mg total) by mouth daily. Home med.   enoxaparin 40 MG/0.4ML injection Commonly known as: LOVENOX Inject 0.4 mLs (40 mg total) into the skin daily.   EPINEPHrine 0.3 mg/0.3 mL Soaj injection Commonly known as: EPI-PEN Inject 0.3 mg into the muscle as needed for anaphylaxis.   fexofenadine 180 MG tablet Commonly known as: Allergy Relief Take 1 tablet (180 mg total) by mouth daily as needed. Home med. What changed: when to take this   gabapentin 300 MG capsule Commonly known as: NEURONTIN TAKE 2 CAPSULES(600 MG) BY MOUTH at BEDTIME and 1 CAPSULE (300MG ) In the AM and PM What changed:  how much to take how to take this when to take  this additional instructions   gabapentin 300 MG capsule Commonly known as: NEURONTIN Take 1 capsule (300 mg total) by mouth 2 (two) times daily. What changed: You were already taking a medication with the same name, and this prescription was added. Make sure you understand how and when to take each.   magnesium gluconate 500 MG tablet Commonly known as: MAGONATE Take 500 mg by mouth 2 (two) times daily.   metFORMIN 500 MG tablet Commonly known as: GLUCOPHAGE Take 2 tablets (1,000 mg total) by mouth 2 (two) times daily with a meal.   metoprolol tartrate 25 MG tablet Commonly known as: LOPRESSOR Take 1 tablet (25 mg total) by mouth 2 (two) times daily.   omeprazole 40 MG capsule Commonly known as: PRILOSEC TAKE 1 CAPSULE(40 MG) BY MOUTH BID   oxyCODONE-acetaminophen 5-325 MG tablet Commonly known as: PERCOCET/ROXICET Take 1-2 tablets by mouth every 6 (six) hours as needed for severe pain.   senna-docusate 8.6-50 MG tablet Commonly known as: Senokot-S  Take 1 tablet by mouth at bedtime as needed for mild constipation.   sucralfate 1 g tablet Commonly known as: CARAFATE Take 1 tablet (1 g total) by mouth 4 (four) times daily as needed. Home med.   Vitamin D-3 125 MCG (5000 UT) Tabs Take 5,000 Units by mouth daily.       Allergies  Allergen Reactions   Benazepril Swelling    Face and mouth swelling.   Ivp Dye [Iodinated Contrast Media] Anaphylaxis    Patient unsure of reaction but in case of reaction d/t shellfish allergy   Lidocaine Rash    02-01-2021: Patient had lidocaine patch applied to chest at sight of injury and she experienced a rash, whelping, and swelling. Resolved after removing the patch   Lyrica [Pregabalin] Swelling    Face/ mouth swelled up balloon   Shellfish Allergy Anaphylaxis and Swelling    Betadine on skin is okay   Shellfish-Derived Products Anaphylaxis   Trulicity [Dulaglutide] Swelling    Face and mouth swelled up   Ace Inhibitors Swelling     Contact information for follow-up providers     Rosetta Posner, DPM Follow up in 3 week(s).   Specialty: Podiatry Contact information: [address] [place] [address] [phone]              Contact information for after-discharge care     Destination     [facility] .   Service: Skilled Nursing Contact information: [address] [place] [address] [phone]                     The results of significant diagnostics from this hospitalization (including imaging, microbiology, ancillary and laboratory) are listed below for reference.    Significant Diagnostic Studies: DG Ankle Complete Right  Result Date: 05/03/2022 CLINICAL DATA:  Postop EXAM: RIGHT ANKLE - COMPLETE 3+ VIEW COMPARISON:  04/14/2022 FINDINGS: Interval lateral surgical plate and fixating screws at the distal fibula. This bridges distal fibular fracture. Interval screw fixation of medial malleolar fracture. There is near anatomic alignment. Mildly displaced posterior malleolar fracture. Reduction of previously noted lateral talar subluxation. Small amount of gas in the soft tissues consistent with recent surgery IMPRESSION: Interval surgical fixation of the distal tibia and fibular for trimalleolar fracture with expected postsurgical changes. Electronically Signed   By: Jasmine Pang M.D.   On: 05/03/2022 17:16   DG Ankle 2 Views Right  Result Date: 05/03/2022 CLINICAL DATA:  Right ankle ORIF EXAM: RIGHT ANKLE - 2 VIEW COMPARISON:  04/14/2022 FINDINGS: Six C-arm fluoroscopic images were obtained intraoperatively and submitted for post operative interpretation. Images obtained during right ankle ORIF with sideplate and screw fixation construct of the distal fibula and screw fixation of the medial malleolus. 32 seconds fluoroscopy time utilized. Radiation dose: Not provided . Please see the performing provider's procedural report for further detail.  IMPRESSION: Intraoperative fluoroscopic guidance for right ankle ORIF. Electronically Signed   By: Duanne Guess D.O.   On: 05/03/2022 15:44   DG C-Arm 1-60 Min-No Report  Result Date: 05/03/2022 Fluoroscopy was utilized by the requesting physician.  No radiographic interpretation.   DG C-Arm 1-60 Min-No Report  Result Date: 05/03/2022 Fluoroscopy was utilized by the requesting physician.  No radiographic interpretation.   [place] OR NERVE BLOCK-IMAGE ONLY [facility])  Result Date: 05/03/2022 There is no interpretation for this exam.  This order is for images obtained during a surgical procedure.  Please See "Surgeries" Tab  for more information regarding the procedure.   DG Ankle 2 Views Right  Result Date: 04/15/2022 CLINICAL DATA:  Status post closed reduction. EXAM: RIGHT ANKLE - 2 VIEW COMPARISON:  04/14/2022 FINDINGS: Fine detail obscured by overlying fiberglass. The fracture dislocation seen on yesterday's exam has been reduced in the interval with marked improvement in bony alignment. IMPRESSION: Marked improvement in bony alignment status post closed reduction. Electronically Signed   By: Kennith Center M.D.   On: 04/15/2022 10:28   DG C-Arm 1-60 Min-No Report  Result Date: 04/15/2022 Fluoroscopy was utilized by the requesting physician.  No radiographic interpretation.   US Venous Img Lower Unilateral Right (DVT)  Result Date: 04/15/2022 CLINICAL DATA:  Right leg pain EXAM: Right LOWER EXTREMITY VENOUS DOPPLER ULTRASOUND TECHNIQUE: Gray-scale sonography with compression, as well as color and duplex ultrasound, were performed to evaluate the deep venous system(s) from the level of the common femoral vein through the popliteal and proximal calf veins. COMPARISON:  None Available. FINDINGS: VENOUS Normal compressibility of the common femoral, superficial femoral, and popliteal veins. Unable to visualize the calf veins due to overlying cast. Visualized portions of profunda femoral vein and  great saphenous vein unremarkable. No filling defects to suggest DVT on grayscale or color Doppler imaging. Doppler waveforms show normal direction of venous flow, normal respiratory plasticity and response to augmentation. Limited views of the contralateral common femoral vein are unremarkable. OTHER None. Limitations: Cast about the calf/ankle. IMPRESSION: No evidence of DVT in the visualized veins. The calf veins were not visualized due to overlying cast. Electronically Signed   By: Minerva Fester M.D.   On: 04/15/2022 01:58   X-ray chest PA and lateral  Result Date: 04/15/2022 CLINICAL DATA:  Preop for right ankle fracture EXAM: CHEST - 2 VIEW COMPARISON:  01/31/2021 FINDINGS: The heart size and mediastinal contours are within normal limits. Both lungs are clear. The visualized skeletal structures are unremarkable. IMPRESSION: No active cardiopulmonary disease. Electronically Signed   By: Minerva Fester M.D.   On: 04/15/2022 00:19   CT Ankle Right Wo Contrast  Result Date: 04/14/2022 CLINICAL DATA:  Right ankle fracture dislocation. EXAM: CT OF THE RIGHT ANKLE WITHOUT CONTRAST TECHNIQUE: Multidetector CT imaging of the right ankle was performed according to the standard protocol. Multiplanar CT image reconstructions were also generated. RADIATION DOSE REDUCTION: This exam was performed according to the departmental dose-optimization program which includes automated exposure control, adjustment of the mA and/or kV according to patient size and/or use of iterative reconstruction technique. COMPARISON:  Right ankle radiographs 7:51 p.m. FINDINGS: Bones/Joint/Cartilage Tibiotalar alignment has been restored since prior examination. Trimalleolar right ankle fracture identified. Oblique, comminuted fracture of the distal fibular metaphyseal region extending to the level of the tibial plafond has been partially reduced now in near anatomic alignment with approximately 5 mm lateral displacement and 5 mm  override of the major distal fracture fragment. Medial malleolar fracture again identified with mild comminution. Distal fracture fragment demonstrates near anatomic alignment and 2 mm lateral displacement of the distal fracture fragment. Posterior malleolar fracture fragment identified comprising 10-15% of the articular surface. Fracture fragment is in near anatomic alignment with a 3 mm articular gap within the a distal tibial articular surface. 8 mm ossific fracture fragment seen anterior to the tibiotalar articulation arising from the a anterior articular surface of the distal tibia. Fracture fragment comprises less than 10% of the distal tibial articular surface. There is, additionally, cortical irregularity involving the superolateral periarticular aspect of the talar dome  best seen on axial image # 134/4 and coronal image # 62/5 in keeping with a probable infraction fracture. Ligaments Suboptimally assessed by CT. Muscles and Tendons Calcification involving the peroneus brevis tendon at the level of the crossing peroneal longus tendon may relate to remote trauma or inflammation. No other abnormality seen. Soft tissues Extensive subcutaneous edema and or interstitial hemorrhage. No loculated subcutaneous fluid collection identified. IMPRESSION: 1. Trimalleolar right ankle fracture as described above. Tibiotalar alignment has been restored since prior examination. 2. Cortical irregularity involving the superolateral periarticular aspect of the talar dome in keeping with a probable infraction fracture. 3. Extensive subcutaneous edema and or interstitial hemorrhage. No loculated subcutaneous fluid collection identified. 4. Calcification involving the peroneus brevis tendon at the level of the crossing peroneal longus tendon may relate to remote trauma or inflammation. Electronically Signed   By: Helyn NumbersAshesh  Parikh M.D.   On: 04/14/2022 22:54   DG Foot Complete Right  Result Date: 04/14/2022 CLINICAL DATA:  Fall,  right ankle injury EXAM: RIGHT FOOT COMPLETE - 3+ VIEW COMPARISON:  None Available. FINDINGS: Comminuted bimalleolar fracture of the right ankle with posterolateral tibiotalar dislocation is better assessed on accompanying radiographs of the right ankle. Osseous structures of the right foot are intact. No fracture or dislocation involving the right foot. Joint spaces are preserved within the right foot. Tiny plantar calcaneal spur. Soft tissues of the right foot are unremarkable. IMPRESSION: 1. Comminuted bimalleolar fracture of the right ankle with posterolateral tibiotalar dislocation. 2. No fracture or dislocation of the right foot. Electronically Signed   By: Helyn NumbersAshesh  Parikh M.D.   On: 04/14/2022 20:16   DG Ankle Complete Right  Result Date: 04/14/2022 CLINICAL DATA:  Fall, right ankle injury EXAM: RIGHT ANKLE - COMPLETE 3+ VIEW COMPARISON:  None Available. FINDINGS: Acute bimalleolar fracture dislocation of the right ankle. Oblique fracture of the distal right fibular metadiaphysis to the level of the tibial plafond with 1/2 shaft with lateral and 1 shaft with posterior displacement and moderate posterior angulation of the distal fracture fragment. Transverse, avulsive type fracture of the medial malleolus at the level of the tibial plafond with 8 mm medial displacement of the distal fracture fragment. Acute intra-articular fracture of the anterior articular aspect of the distal tibia with approximately 6-7 mm posterior displacement of the distal fracture fragment into the joint space. Fracture fragment comprises less than 10% of the articular surface. Posterolateral dislocation of the talar dome in relation to the tibial plafond. Extensive bimalleolar soft tissue swelling. IMPRESSION: 1. Acute bimalleolar fracture dislocation of the right ankle as described above. Electronically Signed   By: Helyn NumbersAshesh  Parikh M.D.   On: 04/14/2022 20:15    Microbiology: No results found for this or any previous visit (from  the past 240 hour(s)).   Labs: Basic Metabolic Panel: No results for input(s): "NA", "K", "CL", "CO2", "GLUCOSE", "BUN", "CREATININE", "CALCIUM", "MG", "PHOS" in the last 168 hours. Liver Function Tests: No results for input(s): "AST", "ALT", "ALKPHOS", "BILITOT", "PROT", "ALBUMIN" in the last 168 hours. No results for input(s): "LIPASE", "AMYLASE" in the last 168 hours. No results for input(s): "AMMONIA" in the last 168 hours. CBC: Recent Labs  Lab 05/10/22 0453  WBC 5.5  HGB 11.0*  HCT 34.0*  MCV 88.1  PLT 340   Cardiac Enzymes: No results for input(s): "CKTOTAL", "CKMB", "CKMBINDEX", "TROPONINI" in the last 168 hours. BNP: BNP (last 3 results) No results for input(s): "BNP" in the last 8760 hours.  ProBNP (last 3 results) No results  for input(s): "PROBNP" in the last 8760 hours.  CBG: Recent Labs  Lab 05/11/22 0815 05/11/22 1225 05/11/22 1755 05/11/22 2049 05/12/22 0734  GLUCAP 113* 87 187* 114* 132*    Time spent: > 30 minutes were spent in preparing this discharge including medication reconciliation, counseling, and coordination of care.  Signed:  Aidric Endicott Vergie Living, MD  [facility] 05/12/2022, 10:03 AM

## 2022-05-12 NOTE — TOC Transition Note (Signed)
Transition of Care [facility]) - CM/SW Discharge Note   Patient Details  Name: Lisa Galvan MRN: [id number] Date of Birth: 02/13/1961  Transition of [facility]) CM/SW Contact:  Izola Price, RN Phone Number: 05/12/2022, 10:40 AM   Clinical Narrative: :05/12/22: Patient discharging today and being transferred to [facility] [place] [facility]. Report called Meredith Mody at [phone] by Unit RN. DC Summary faxed to [phone] per Gevena Barre when RN CM called to confirm acceptance of patient today. Daughter was made aware per Unit RN of transfer today. EMS forms printed to unit. [facility] called at 1033 am. They could not give a firm time but was notified or 2 pm deadline for patient to be in the building. Updated Unit RN. Simmie Davies RN CM     Final next level of care: Skilled Nursing Facility Barriers to Discharge: Barriers Resolved   Patient Goals and CMS Choice      Discharge Placement                Patient chooses bed at:  ([facility] [place], [place], [place] Patient to be transferred to facility by:  [facility])   Patient and family notified of of transfer: 05/11/22 (Per Unit CN Phylliss Bob RN daughter was notified Friday 05/11/22)  Discharge Plan and Services Additional resources added to the After Visit Summary for                  DME Arranged: N/A DME Agency: NA       HH Arranged: NA [place] Agency: NA        Social Determinants of Health (SDOH) Interventions SDOH Screenings   Food Insecurity: No Food Insecurity (05/04/2022)  Housing: Low Risk  (05/04/2022)  Transportation Needs: No Transportation Needs (05/04/2022)  Utilities: Not At Risk (05/04/2022)  Alcohol Screen: Low Risk  (12/21/2021)  Depression (PHQ2-9): Low Risk  (04/20/2022)  Financial Resource Strain: High Risk (01/02/2022)  Physical Activity: Inactive (12/14/2020)  Social Connections: Moderately Isolated (12/21/2021)  Stress: Stress Concern Present (12/21/2021)  Tobacco Use: Low Risk  (05/04/2022)     Readmission Risk  Interventions     No data to display

## 2022-05-14 ENCOUNTER — Encounter: Payer: BC Managed Care – PPO | Admitting: Podiatry

## 2022-05-14 DIAGNOSIS — S82891D Other fracture of right lower leg, subsequent encounter for closed fracture with routine healing: Secondary | ICD-10-CM | POA: Diagnosis not present

## 2022-05-14 DIAGNOSIS — G894 Chronic pain syndrome: Secondary | ICD-10-CM | POA: Diagnosis not present

## 2022-05-14 DIAGNOSIS — I10 Essential (primary) hypertension: Secondary | ICD-10-CM | POA: Diagnosis not present

## 2022-05-14 DIAGNOSIS — E1142 Type 2 diabetes mellitus with diabetic polyneuropathy: Secondary | ICD-10-CM | POA: Diagnosis not present

## 2022-05-15 DIAGNOSIS — F324 Major depressive disorder, single episode, in partial remission: Secondary | ICD-10-CM | POA: Diagnosis not present

## 2022-05-15 DIAGNOSIS — Z7901 Long term (current) use of anticoagulants: Secondary | ICD-10-CM | POA: Diagnosis not present

## 2022-05-15 DIAGNOSIS — Z7984 Long term (current) use of oral hypoglycemic drugs: Secondary | ICD-10-CM | POA: Diagnosis not present

## 2022-05-15 DIAGNOSIS — S82851D Displaced trimalleolar fracture of right lower leg, subsequent encounter for closed fracture with routine healing: Secondary | ICD-10-CM | POA: Diagnosis not present

## 2022-05-15 DIAGNOSIS — I10 Essential (primary) hypertension: Secondary | ICD-10-CM | POA: Diagnosis not present

## 2022-05-15 DIAGNOSIS — G4733 Obstructive sleep apnea (adult) (pediatric): Secondary | ICD-10-CM | POA: Diagnosis not present

## 2022-05-15 DIAGNOSIS — E1140 Type 2 diabetes mellitus with diabetic neuropathy, unspecified: Secondary | ICD-10-CM | POA: Diagnosis not present

## 2022-05-15 DIAGNOSIS — E785 Hyperlipidemia, unspecified: Secondary | ICD-10-CM | POA: Diagnosis not present

## 2022-05-16 DIAGNOSIS — M25571 Pain in right ankle and joints of right foot: Secondary | ICD-10-CM | POA: Diagnosis not present

## 2022-05-16 DIAGNOSIS — S82851D Displaced trimalleolar fracture of right lower leg, subsequent encounter for closed fracture with routine healing: Secondary | ICD-10-CM | POA: Diagnosis not present

## 2022-05-23 DIAGNOSIS — S82851D Displaced trimalleolar fracture of right lower leg, subsequent encounter for closed fracture with routine healing: Secondary | ICD-10-CM | POA: Diagnosis not present

## 2022-06-05 DIAGNOSIS — Z09 Encounter for follow-up examination after completed treatment for conditions other than malignant neoplasm: Secondary | ICD-10-CM | POA: Diagnosis not present

## 2022-06-05 DIAGNOSIS — M25571 Pain in right ankle and joints of right foot: Secondary | ICD-10-CM | POA: Diagnosis not present

## 2022-06-05 DIAGNOSIS — S82851D Displaced trimalleolar fracture of right lower leg, subsequent encounter for closed fracture with routine healing: Secondary | ICD-10-CM | POA: Diagnosis not present

## 2022-06-06 ENCOUNTER — Encounter: Payer: BC Managed Care – PPO | Admitting: Podiatry

## 2022-06-06 ENCOUNTER — Encounter: Payer: Self-pay | Admitting: Family Medicine

## 2022-06-06 ENCOUNTER — Ambulatory Visit: Payer: BC Managed Care – PPO | Admitting: Family Medicine

## 2022-06-06 VITALS — BP 135/85 | HR 92 | Temp 98.3°F

## 2022-06-06 DIAGNOSIS — I700 Atherosclerosis of aorta: Secondary | ICD-10-CM | POA: Diagnosis not present

## 2022-06-06 DIAGNOSIS — E785 Hyperlipidemia, unspecified: Secondary | ICD-10-CM

## 2022-06-06 DIAGNOSIS — M792 Neuralgia and neuritis, unspecified: Secondary | ICD-10-CM

## 2022-06-06 DIAGNOSIS — E559 Vitamin D deficiency, unspecified: Secondary | ICD-10-CM | POA: Diagnosis not present

## 2022-06-06 DIAGNOSIS — I10 Essential (primary) hypertension: Secondary | ICD-10-CM | POA: Diagnosis not present

## 2022-06-06 DIAGNOSIS — E1140 Type 2 diabetes mellitus with diabetic neuropathy, unspecified: Secondary | ICD-10-CM | POA: Diagnosis not present

## 2022-06-06 DIAGNOSIS — K760 Fatty (change of) liver, not elsewhere classified: Secondary | ICD-10-CM

## 2022-06-06 DIAGNOSIS — K219 Gastro-esophageal reflux disease without esophagitis: Secondary | ICD-10-CM

## 2022-06-06 DIAGNOSIS — Z0000 Encounter for general adult medical examination without abnormal findings: Secondary | ICD-10-CM

## 2022-06-06 DIAGNOSIS — Z13820 Encounter for screening for osteoporosis: Secondary | ICD-10-CM

## 2022-06-06 MED ORDER — DULOXETINE HCL 60 MG PO CPEP: 60.0000 mg | ORAL_CAPSULE | Freq: Every day | ORAL | 1 refills | Status: DC

## 2022-06-06 MED ORDER — OMEPRAZOLE 40 MG PO CPDR: DELAYED_RELEASE_CAPSULE | ORAL | 1 refills | Status: DC

## 2022-06-06 MED ORDER — GABAPENTIN 300 MG PO CAPS: 300.0000 mg | ORAL_CAPSULE | Freq: Three times a day (TID) | ORAL | 1 refills | Status: DC

## 2022-06-06 MED ORDER — SUCRALFATE 1 G PO TABS: 1.0000 g | ORAL_TABLET | Freq: Four times a day (QID) | ORAL | 1 refills | Status: DC | PRN

## 2022-06-06 MED ORDER — METOPROLOL SUCCINATE ER 25 MG PO TB24: 25.0000 mg | ORAL_TABLET | Freq: Every day | ORAL | 1 refills | Status: DC

## 2022-06-06 MED ORDER — METFORMIN HCL 500 MG PO TABS: 500.0000 mg | ORAL_TABLET | Freq: Two times a day (BID) | ORAL | 1 refills | Status: DC

## 2022-06-06 MED ORDER — ATORVASTATIN CALCIUM 20 MG PO TABS: 20.0000 mg | ORAL_TABLET | Freq: Every day | ORAL | 1 refills | Status: DC

## 2022-06-06 NOTE — Assessment & Plan Note (Signed)
Will keep BP and cholesterol under good control. Continue to monitor. Call with any concerns.

## 2022-06-06 NOTE — Progress Notes (Signed)
BP 135/85   Pulse 92   Temp 98.3 F (36.8 C) (Oral)   SpO2 98%    Subjective:    Patient ID: Lisa Galvan, female    DOB: 03/03/1961, 62 y.o.   MRN: [id number]  HPI: Lisa Galvan is a 62 y.o. female presenting on 06/06/2022 for comprehensive medical examination. Current medical complaints include:  DIABETES Hypoglycemic episodes:no Polydipsia/polyuria: no Visual disturbance: no Chest pain: no Paresthesias: no Glucose Monitoring: yes  Accucheck frequency: Daily Taking Insulin?: no Blood Pressure Monitoring: not checking Retinal Examination: Up to Date Foot Exam: Up to Date Diabetic Education: Completed Pneumovax: Up to Date Influenza: Up to Date Aspirin: yes  HYPERTENSION / HYPERLIPIDEMIA Satisfied with current treatment? yes Duration of hypertension: chronic BP monitoring frequency: not checking BP medication side effects: no Past BP meds: metoprolol Duration of hyperlipidemia: chronic Cholesterol medication side effects: no Cholesterol supplements: none Past cholesterol medications: atorvastatin Medication compliance: excellent compliance Aspirin: yes Recent stressors: no Recurrent headaches: no Visual changes: no Palpitations: no Dyspnea: no Chest pain: no Lower extremity edema: no Dizzy/lightheaded: no   She currently lives with: daughter and grand daughter Menopausal Symptoms: no  Depression Screen done today and results listed below:     06/06/2022    9:27 AM 04/20/2022    8:37 AM 03/08/2022    2:37 PM 03/06/2022    9:14 AM 01/15/2022    2:58 PM  Depression screen PHQ 2/9  Decreased Interest 0 2 0 0 0  Down, Depressed, Hopeless 0 0 0 0 0  PHQ - 2 Score 0 2 0 0 0  Altered sleeping 0 0  0 0  Tired, decreased energy 0 0  0 0  Change in appetite 0 0  0 0  Feeling bad or failure about yourself  0 0  0 0  Trouble concentrating 0 0  0 0  Moving slowly or fidgety/restless 0 0  0 0  Suicidal thoughts 0 0  0 0  PHQ-9 Score 0 2  0 0  Difficult doing  work/chores Not difficult at all Somewhat difficult  Not difficult at all Not difficult at all    Past Medical History:  Past Medical History:  Diagnosis Date   Allergy    Anemia    Arthritis    spine   Bilateral leg edema    Chest pain 12/15/2014   Chronic sciatica 12/10/2014   DDD (degenerative disc disease), lumbar    Essential hypertension 12/10/2014   Gastroesophageal reflux disease without esophagitis    Mixed hyperlipidemia    Neuropathy    Obesity (BMI 30.0-34.9) 12/10/2014   Pneumonia 1968   Sleep apnea    waiting for new cpap   Type 2 diabetes mellitus without complication ([place]) [id number]   Umbilical hernia     Surgical History:  Past Surgical History:  Procedure Laterality Date   ABDOMINAL HYSTERECTOMY     ANKLE CLOSED REDUCTION Right 04/15/2022   Procedure: CLOSED REDUCTION ANKLE;  Surgeon: Criselda Peaches, DPM;  Location: [facility] ORS;  Service: Podiatry;  Laterality: Right;   COLONOSCOPY WITH PROPOFOL N/A 03/30/2019   Procedure: COLONOSCOPY WITH PROPOFOL;  Surgeon: Lin Landsman, MD;  Location: [facility];  Service: Gastroenterology;  Laterality: N/A;   ESOPHAGOGASTRODUODENOSCOPY (EGD) WITH PROPOFOL N/A 03/12/2017   Procedure: ESOPHAGOGASTRODUODENOSCOPY (EGD) WITH PROPOFOL;  Surgeon: Lin Landsman, MD;  Location: [facility] ENDOSCOPY;  Service: Gastroenterology;  Laterality: N/A;   ESOPHAGOGASTRODUODENOSCOPY (EGD) WITH PROPOFOL N/A 03/30/2019   Procedure: ESOPHAGOGASTRODUODENOSCOPY (EGD) WITH PROPOFOL;  Surgeon: Lin Landsman, MD;  Location: [facility] ENDOSCOPY;  Service: Gastroenterology;  Laterality: N/A;   HERNIA REPAIR     INCISIONAL HERNIA REPAIR N/A 11/29/2020   Procedure: HERNIA REPAIR INCISIONAL, open;  Surgeon: Jules Husbands, MD;  Location: [facility] OR[facility];  Service: General;  Laterality: N/A;   ORIF ANKLE FRACTURE Left 07/30/2019   Procedure: OPEN REDUCTION INTERNAL FIXATION (ORIF) OF LEFT BIMALLEOLAR ANKLE FRACTURE;  Surgeon: Leim Fabry, MD;   Location: [place];  Service: Orthopedics;  Laterality: Left;  Diabetic - oral meds   ORIF ANKLE FRACTURE Right 05/03/2022   Procedure: OPEN REDUCTION INTERNAL FIXATION (ORIF) ANKLE FRACTURE - TRIMALLEOLAR WITH INTERNAL FIXATION;  Surgeon: Caroline More, DPM;  Location: [facility];  Service: Podiatry;  Laterality: Right;   SUPRACERVICAL ABDOMINAL HYSTERECTOMY     UMBILICAL HERNIA REPAIR N/A 05/01/2017   Procedure: HERNIA REPAIR UMBILICAL ADULT;  Surgeon: Vickie Epley, MD;  Location: [facility];  Service: General;  Laterality: N/A;   XI ROBOTIC ASSISTED VENTRAL HERNIA N/A 10/06/2019   Procedure: XI ROBOTIC ASSISTED VENTRAL HERNIA;  Surgeon: Jules Husbands, MD;  Location: [facility];  Service: General;  Laterality: N/A;    Medications:  Current Outpatient Medications on File Prior to Visit  Medication Sig   Accu-Chek Softclix Lancets lancets TEST TWICE DAILY   aspirin EC 81 MG tablet Take 81 mg by mouth daily. Swallow whole.   Cholecalciferol (VITAMIN D-3) 5000 units TABS Take 5,000 Units by mouth daily.   enoxaparin (LOVENOX) 40 MG/0.4ML injection Inject 0.4 mLs (40 mg total) into the skin daily.   EPINEPHrine 0.3 mg/0.3 mL IJ SOAJ injection Inject 0.3 mg into the muscle as needed for anaphylaxis.   fexofenadine (ALLERGY RELIEF) 180 MG tablet Take 1 tablet (180 mg total) by mouth daily as needed. Home med. (Patient taking differently: Take 180 mg by mouth daily. Home med.)   magnesium gluconate (MAGONATE) 500 MG tablet Take 500 mg by mouth 2 (two) times daily.   vitamin B-12 (CYANOCOBALAMIN) 1000 MCG tablet Take 1,000 mcg by mouth daily.   No current facility-administered medications on file prior to visit.    Allergies:  Allergies  Allergen Reactions   Benazepril Swelling    Face and mouth swelling.   Ivp Dye [Iodinated Contrast Media] Anaphylaxis    Patient unsure of reaction but in case of reaction d/t shellfish allergy   Lidocaine Rash    02-01-2021: Patient had lidocaine  patch applied to chest at sight of injury and she experienced a rash, whelping, and swelling. 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 Her daughter will provide her transportation  and will stay with patient after surgery.   Feels safe in her home.   Minimal stairs in her home.   No lifting at work.   Social Determinants of Health   Financial Resource Strain: High Risk (01/02/2022)   Overall Financial Resource Strain (CARDIA)    Difficulty of Paying Living Expenses: Hard  Food Insecurity: No Food Insecurity (05/04/2022)   Hunger Vital Sign    Worried About Running Out of Food in the Last Year: Never true    Ran Out of Food in the Last Year: Never true  Transportation Needs: No Transportation Needs (05/04/2022)   PRAPARE - Hydrologist (Medical): No    Lack of Transportation (Non-Medical): No  Physical Activity: Inactive (12/14/2020)   Exercise Vital Sign    Days of Exercise per Week: 0 days    Minutes of Exercise per Session:  0 min  Stress: Stress Concern Present (12/21/2021)   [place]    Feeling of Stress : To some extent  Social Connections: Moderately Isolated (12/21/2021)   Social Connection and Isolation Panel [NHANES]    Frequency of Communication with Friends and Family: More than three times a week    Frequency of Social Gatherings with Friends and Family: More than three times a week    Attends Religious Services: More than 4 times per year    Active Member of [employer] or Organizations: No    Attends Archivist Meetings: Never    Marital Status: Widowed  Intimate Partner Violence: Not At Risk (05/04/2022)   Humiliation, Afraid, Rape, and Kick questionnaire    Fear of Current or Ex-Partner: No    Emotionally Abused: No    Physically Abused: No    Sexually Abused: No   Social History   Tobacco Use  Smoking Status Never  Smokeless Tobacco Never   Social History   Substance and Sexual Activity  Alcohol Use Yes   Alcohol/week: 3.0 standard drinks of alcohol   Types: 3 Glasses of wine per week    Family History:  Family History  Problem Relation Age of Onset   Diabetes Mother    Cancer Mother        breast and stomach   Breast cancer Mother 68   Dementia Mother    Heart disease Father    Kidney disease Father        dialysis   Cancer Father        colon   Diabetes Father    Hypertension Sister    Hypertension Brother    Hypertension Daughter    Congestive Heart Failure Maternal Grandmother    Diabetes Maternal Grandmother    Hypertension Maternal Grandmother     Past medical history, surgical history, medications, allergies, family history and social history reviewed with patient today and changes made to appropriate areas of the chart.   Review of Systems  Constitutional: Negative.   HENT:  Positive for ear pain (Right). Negative for congestion, ear discharge, hearing loss, nosebleeds, sinus pain, sore  throat and tinnitus.   Eyes: Negative.   Respiratory: Negative.  Negative for stridor.   Cardiovascular: Negative.   Gastrointestinal:  Positive for diarrhea. Negative for abdominal pain, blood in stool, constipation,  heartburn, melena, nausea and vomiting.  Genitourinary: Negative.   Musculoskeletal:  Positive for back pain, falls and myalgias. Negative for joint pain and neck pain.  Skin: Negative.   Neurological: Negative.   Endo/Heme/Allergies:  Negative for environmental allergies and polydipsia. Bruises/bleeds easily.  Psychiatric/Behavioral: Negative.     All other ROS negative except what is listed above and in the HPI.      Objective:    BP 135/85   Pulse 92   Temp 98.3 F (36.8 C) (Oral)   SpO2 98%   Wt Readings from Last 3 Encounters:  05/03/22 151 lb (68.5 kg)  04/27/22 151 lb (68.5 kg)  04/20/22 151 lb (68.5 kg)    Physical Exam Vitals and nursing note reviewed.  Constitutional:      General: She is not in acute distress.    Appearance: Normal appearance. She is not ill-appearing, toxic-appearing or diaphoretic.  HENT:     Head: Normocephalic and atraumatic.     Right Ear: Tympanic membrane, ear canal and external ear normal. There is no impacted cerumen.     Left Ear: Tympanic membrane, ear canal and external ear normal. There is no impacted cerumen.     Nose: Nose normal. No congestion or rhinorrhea.     Mouth/Throat:     Mouth: Mucous membranes are moist.     Pharynx: Oropharynx is clear. No oropharyngeal exudate or posterior oropharyngeal erythema.  Eyes:     General: No scleral icterus.       Right eye: No discharge.        Left eye: No discharge.     Extraocular Movements: Extraocular movements intact.     Conjunctiva/sclera: Conjunctivae normal.     Pupils: Pupils are equal, round, and reactive to light.  Neck:     Vascular: No carotid bruit.  Cardiovascular:     Rate and Rhythm: Normal rate and regular rhythm.     Pulses: Normal pulses.      Heart sounds: No murmur heard.    No friction rub. No gallop.  Pulmonary:     Effort: Pulmonary effort is normal. No respiratory distress.     Breath sounds: Normal breath sounds. No stridor. No wheezing, rhonchi or rales.  Chest:     Chest wall: No tenderness.  Abdominal:     General: Abdomen is flat. Bowel sounds are normal. There is no distension.     Palpations: Abdomen is soft. There is no mass.     Tenderness: There is no abdominal tenderness. There is no right CVA tenderness, left CVA tenderness, guarding or rebound.     Hernia: No hernia is present.  Genitourinary:    Comments: Breast and pelvic exams deferred with shared decision making Musculoskeletal:        General: No swelling, tenderness, deformity or signs of injury.     Cervical back: Normal range of motion and neck supple. No rigidity. No muscular tenderness.     Right lower leg: No edema.     Left lower leg: No edema.  Lymphadenopathy:     Cervical: No cervical adenopathy.  Skin:    General: Skin is warm and dry.     Capillary Refill: Capillary refill takes less than 2 seconds.     Coloration: Skin is not jaundiced or pale.     Findings: No bruising, erythema, lesion or rash.  Neurological:     General: No focal deficit present.     Mental Status: She is alert and oriented to  person, place, and time. Mental status is at baseline.     Cranial Nerves: No cranial nerve deficit.     Sensory: No sensory deficit.     Motor: No weakness.     Coordination: Coordination normal.     Gait: Gait normal.     Deep Tendon Reflexes: Reflexes normal.  Psychiatric:        Mood and Affect: Mood normal.        Behavior: Behavior normal.        Thought Content: Thought content normal.        Judgment: Judgment normal.     Results for orders placed or performed during the hospital encounter of 05/03/22  Glucose, capillary  Result Value Ref Range   Glucose-Capillary 91 70 - 99 mg/dL  Glucose, capillary  Result Value Ref  Range   Glucose-Capillary 125 (H) 70 - 99 mg/dL  Magnesium  Result Value Ref Range   Magnesium 1.3 (L) 1.7 - 2.4 mg/dL  Basic metabolic panel  Result Value Ref Range   Sodium 135 135 - 145 mmol/L   Potassium 4.2 3.5 - 5.1 mmol/L   Chloride 102 98 - 111 mmol/L   CO2 23 22 - 32 mmol/L   Glucose, Bld 248 (H) 70 - 99 mg/dL   BUN 7 (L) 8 - 23 mg/dL   Creatinine, Ser 0.90 0.44 - 1.00 mg/dL   Calcium 8.4 (L) 8.9 - 10.3 mg/dL   GFR, Estimated >60 >60 mL/min   Anion gap 10 5 - 15  CBC  Result Value Ref Range   WBC 5.6 4.0 - 10.5 K/uL   RBC 4.05 3.87 - 5.11 MIL/uL   Hemoglobin 11.3 (L) 12.0 - 15.0 g/dL   HCT 34.3 (L) 36.0 - 46.0 %   MCV 84.7 80.0 - 100.0 fL   MCH 27.9 26.0 - 34.0 pg   MCHC 32.9 30.0 - 36.0 g/dL   RDW 17.2 (H) 11.5 - 15.5 %   Platelets 411 (H) 150 - 400 K/uL   nRBC 0.0 0.0 - 0.2 %  Glucose, capillary  Result Value Ref Range   Glucose-Capillary 219 (H) 70 - 99 mg/dL  Glucose, capillary  Result Value Ref Range   Glucose-Capillary 205 (H) 70 - 99 mg/dL  Magnesium  Result Value Ref Range   Magnesium 1.3 (L) 1.7 - 2.4 mg/dL  Glucose, capillary  Result Value Ref Range   Glucose-Capillary 150 (H) 70 - 99 mg/dL  Basic metabolic panel  Result Value Ref Range   Sodium 136 135 - 145 mmol/L   Potassium 3.6 3.5 - 5.1 mmol/L   Chloride 104 98 - 111 mmol/L   CO2 27 22 - 32 mmol/L   Glucose, Bld 123 (H) 70 - 99 mg/dL   BUN 6 (L) 8 - 23 mg/dL   Creatinine, Ser 0.79 0.44 - 1.00 mg/dL   Calcium 8.7 (L) 8.9 - 10.3 mg/dL   GFR, Estimated >60 >60 mL/min   Anion gap 5 5 - 15  Magnesium  Result Value Ref Range   Magnesium 2.1 1.7 - 2.4 mg/dL  Glucose, capillary  Result Value Ref Range   Glucose-Capillary 139 (H) 70 - 99 mg/dL  Glucose, capillary  Result Value Ref Range   Glucose-Capillary 58 (L) 70 - 99 mg/dL  Glucose, capillary  Result Value Ref Range   Glucose-Capillary 106 (H) 70 - 99 mg/dL  Glucose, capillary  Result Value Ref Range   Glucose-Capillary 119 (H) 70  - 99 mg/dL  Glucose, capillary  Result Value Ref Range   Glucose-Capillary 110 (H) 70 - 99 mg/dL  Glucose, capillary  Result Value Ref Range   Glucose-Capillary 72 70 - 99 mg/dL  Glucose, capillary  Result Value Ref Range   Glucose-Capillary 112 (H) 70 - 99 mg/dL  Glucose, capillary  Result Value Ref Range   Glucose-Capillary 112 (H) 70 - 99 mg/dL   Comment 1 Notify RN   Glucose, capillary  Result Value Ref Range   Glucose-Capillary 103 (H) 70 - 99 mg/dL  Glucose, capillary  Result Value Ref Range   Glucose-Capillary 79 70 - 99 mg/dL  Glucose, capillary  Result Value Ref Range   Glucose-Capillary 143 (H) 70 - 99 mg/dL  Glucose, capillary  Result Value Ref Range   Glucose-Capillary 71 70 - 99 mg/dL  Glucose, capillary  Result Value Ref Range   Glucose-Capillary 106 (H) 70 - 99 mg/dL  Glucose, capillary  Result Value Ref Range   Glucose-Capillary 124 (H) 70 - 99 mg/dL  Glucose, capillary  Result Value Ref Range   Glucose-Capillary 170 (H) 70 - 99 mg/dL  Glucose, capillary  Result Value Ref Range   Glucose-Capillary 112 (H) 70 - 99 mg/dL  Glucose, capillary  Result Value Ref Range   Glucose-Capillary 110 (H) 70 - 99 mg/dL  Glucose, capillary  Result Value Ref Range   Glucose-Capillary 150 (H) 70 - 99 mg/dL  Glucose, capillary  Result Value Ref Range   Glucose-Capillary 230 (H) 70 - 99 mg/dL   Comment 1 Document in Chart   Glucose, capillary  Result Value Ref Range   Glucose-Capillary 63 (L) 70 - 99 mg/dL  Glucose, capillary  Result Value Ref Range   Glucose-Capillary 113 (H) 70 - 99 mg/dL  Glucose, capillary  Result Value Ref Range   Glucose-Capillary 116 (H) 70 - 99 mg/dL  Glucose, capillary  Result Value Ref Range   Glucose-Capillary 161 (H) 70 - 99 mg/dL  Glucose, capillary  Result Value Ref Range   Glucose-Capillary 177 (H) 70 - 99 mg/dL  CBC  Result Value Ref Range   WBC 5.5 4.0 - 10.5 K/uL   RBC 3.86 (L) 3.87 - 5.11 MIL/uL   Hemoglobin 11.0 (L)  12.0 - 15.0 g/dL   HCT 34.0 (L) 36.0 - 46.0 %   MCV 88.1 80.0 - 100.0 fL   MCH 28.5 26.0 - 34.0 pg   MCHC 32.4 30.0 - 36.0 g/dL   RDW 18.3 (H) 11.5 - 15.5 %   Platelets 340 150 - 400 K/uL   nRBC 0.0 0.0 - 0.2 %  Glucose, capillary  Result Value Ref Range   Glucose-Capillary 140 (H) 70 - 99 mg/dL   Comment 1 Notify RN   Glucose, capillary  Result Value Ref Range   Glucose-Capillary 152 (H) 70 - 99 mg/dL  Glucose, capillary  Result Value Ref Range   Glucose-Capillary 137 (H) 70 - 99 mg/dL  Glucose, capillary  Result Value Ref Range   Glucose-Capillary 93 70 - 99 mg/dL  Glucose, capillary  Result Value Ref Range   Glucose-Capillary 186 (H) 70 - 99 mg/dL   Comment 1 Notify RN   Glucose, capillary  Result Value Ref Range   Glucose-Capillary 113 (H) 70 - 99 mg/dL  Glucose, capillary  Result Value Ref Range   Glucose-Capillary 87 70 - 99 mg/dL  Glucose, capillary  Result Value Ref Range   Glucose-Capillary 187 (H) 70 - 99 mg/dL  Glucose, capillary  Result Value Ref Range   Glucose-Capillary 114 (  H) 70 - 99 mg/dL   Comment 1 Notify RN   Glucose, capillary  Result Value Ref Range   Glucose-Capillary 132 (H) 70 - 99 mg/dL  I-STAT, chem 8  Result Value Ref Range   Sodium 137 135 - 145 mmol/L   Potassium 3.4 (L) 3.5 - 5.1 mmol/L   Chloride 98 98 - 111 mmol/L   BUN <3 (L) 8 - 23 mg/dL   Creatinine, Ser 0.60 0.44 - 1.00 mg/dL   Glucose, Bld 89 70 - 99 mg/dL   Calcium, Ion 1.17 1.15 - 1.40 mmol/L   TCO2 28 22 - 32 mmol/L   Hemoglobin 12.2 12.0 - 15.0 g/dL   HCT 36.0 36.0 - 46.0 %      Assessment & Plan:   Problem List Items Addressed This Visit       Cardiovascular and Mediastinum   Essential hypertension    Under good control on current regimen. Continue current regimen. Continue to monitor. Call with any concerns. Refills given. Labs drawn today.       Relevant Medications   atorvastatin (LIPITOR) 20 MG tablet   metoprolol succinate (TOPROL-XL) 25 MG 24 hr  tablet   Other Relevant Orders   CBC with Differential/Platelet   Comprehensive metabolic panel   TSH   Urine Microalbumin w/creat. ratio   Aortic atherosclerosis (HCC)    Will keep BP and cholesterol under good control. Continue to monitor. Call with any concerns.       Relevant Medications   atorvastatin (LIPITOR) 20 MG tablet   metoprolol succinate (TOPROL-XL) 25 MG 24 hr tablet   Other Relevant Orders   CBC with Differential/Platelet   Comprehensive metabolic panel     Digestive   Gastroesophageal reflux disease without esophagitis    Under good control on current regimen. Continue current regimen. Continue to monitor. Call with any concerns. Refills given. Labs drawn today.       Relevant Medications   omeprazole (PRILOSEC) 40 MG capsule   sucralfate (CARAFATE) 1 g tablet   Fatty liver    Rechecking labs today. Await results. Treat as needed.         Endocrine   Type 2 diabetes mellitus with diabetic neuropathy, unspecified ([place])    Having diarrhea with metformin. Will cut down to 587m BID and start ozempic. Call with any concerns. Recheck in 3 months.      Relevant Medications   atorvastatin (LIPITOR) 20 MG tablet   metFORMIN (GLUCOPHAGE) 500 MG tablet   Other Relevant Orders   Hgb A1c w/o eAG   CBC with Differential/Platelet   Comprehensive metabolic panel   Urinalysis, Routine w reflex microscopic   Urine Microalbumin w/creat. ratio     Other   Neurogenic pain (Chronic)    Stable. Continue cymbalta and gabapentin. Call with any concerns.       Relevant Medications   DULoxetine (CYMBALTA) 60 MG capsule   Hyperlipidemia    Under good control on current regimen. Continue current regimen. Continue to monitor. Call with any concerns. Refills given. Labs drawn today.       Relevant Medications   atorvastatin (LIPITOR) 20 MG tablet   metoprolol succinate (TOPROL-XL) 25 MG 24 hr tablet   Other Relevant Orders   CBC with Differential/Platelet    Comprehensive metabolic panel   Lipid Panel w/o Chol/HDL Ratio   Vitamin D deficiency    Rechecking labs today. Await results. Treat as needed.       Relevant Orders   CBC  with Differential/Platelet   Comprehensive metabolic panel   VITAMIN D 25 Hydroxy (Vit-D Deficiency, Fractures)   Other Visit Diagnoses     Routine general medical examination at a health care facility    -  Primary   Vaccines up to date. Screening labs checked today. Pap, mammo and DEXA up to date. Continue diet and exercise. Call with any concerns.   Screening for osteoporosis       DEXA ordered today. Await results.   Relevant Orders   DG Bone Density        Follow up plan: Return in about 3 months (around 09/04/2022).   LABORATORY TESTING:  - Pap smear: up to date  IMMUNIZATIONS:   - Tdap: Tetanus vaccination status reviewed: last tetanus booster within 10 years. - Influenza: Up to date - Pneumovax: Up to date - Prevnar: Not applicable - COVID: Up to date - HPV: Not applicable - Shingrix vaccine: Up to date  SCREENING: -Mammogram: Up to date  - Colonoscopy: Up to date  - Bone Density: Not applicable   PATIENT COUNSELING:   Advised to take 1 mg of folate supplement per day if capable of pregnancy.   Sexuality: Discussed sexually transmitted diseases, partner selection, use of condoms, avoidance of unintended pregnancy  and contraceptive alternatives.   Advised to avoid cigarette smoking.  I discussed with the patient that most people either abstain from alcohol or drink within safe limits (<=14/week and <=4 drinks/occasion for males, <=7/weeks and <= 3 drinks/occasion for females) and that the risk for alcohol disorders and other health effects rises proportionally with the number of drinks per week and how often a drinker exceeds daily limits.  Discussed cessation/primary prevention of drug use and availability of treatment for abuse.   Diet: Encouraged to adjust caloric intake to maintain   or achieve ideal body weight, to reduce intake of dietary saturated fat and total fat, to limit sodium intake by avoiding high sodium foods and not adding table salt, and to maintain adequate dietary potassium and calcium preferably from fresh fruits, vegetables, and low-fat dairy products.    stressed the importance of regular exercise  Injury prevention: Discussed safety belts, safety helmets, smoke detector, smoking near bedding or upholstery.   Dental health: Discussed importance of regular tooth brushing, flossing, and dental visits.    NEXT PREVENTATIVE PHYSICAL DUE IN 1 YEAR. Return in about 3 months (around 09/04/2022).

## 2022-06-06 NOTE — Assessment & Plan Note (Signed)
Under good control on current regimen. Continue current regimen. Continue to monitor. Call with any concerns. Refills given. Labs drawn today.   

## 2022-06-06 NOTE — Assessment & Plan Note (Signed)
Having diarrhea with metformin. Will cut down to 580m BID and start ozempic. Call with any concerns. Recheck in 3 months.

## 2022-06-06 NOTE — Assessment & Plan Note (Signed)
Stable. Continue cymbalta and gabapentin. Call with any concerns.

## 2022-06-06 NOTE — Patient Instructions (Addendum)
Patient is scheduled for Bone Density Scan at [facility] Location at on 07/18/22 at 2:20 PM. If you have any questions regarding appointment. Please reach out to [facility] at [phone].

## 2022-06-06 NOTE — Assessment & Plan Note (Signed)
Rechecking labs today. Await results. Treat as needed.  °

## 2022-06-07 ENCOUNTER — Telehealth: Payer: Self-pay | Admitting: Family Medicine

## 2022-06-07 ENCOUNTER — Other Ambulatory Visit: Payer: Self-pay | Admitting: Family Medicine

## 2022-06-07 DIAGNOSIS — R8281 Pyuria: Secondary | ICD-10-CM

## 2022-06-07 LAB — CBC WITH DIFFERENTIAL/PLATELET
Basophils Absolute: 0 10*3/uL (ref 0.0–0.2)
Basos: 1 %
EOS (ABSOLUTE): 0.1 10*3/uL (ref 0.0–0.4)
Eos: 1 %
Hematocrit: 39.8 % (ref 34.0–46.6)
Hemoglobin: 12.8 g/dL (ref 11.1–15.9)
Immature Grans (Abs): 0 10*3/uL (ref 0.0–0.1)
Immature Granulocytes: 0 %
Lymphocytes Absolute: 2.5 10*3/uL (ref 0.7–3.1)
Lymphs: 56 %
MCH: 27.8 pg (ref 26.6–33.0)
MCHC: 32.2 g/dL (ref 31.5–35.7)
MCV: 87 fL (ref 79–97)
Monocytes Absolute: 0.3 10*3/uL (ref 0.1–0.9)
Monocytes: 6 %
Neutrophils Absolute: 1.6 10*3/uL (ref 1.4–7.0)
Neutrophils: 36 %
Platelets: 290 10*3/uL (ref 150–450)
RBC: 4.6 x10E6/uL (ref 3.77–5.28)
RDW: 16.9 % — ABNORMAL HIGH (ref 11.7–15.4)
WBC: 4.5 10*3/uL (ref 3.4–10.8)

## 2022-06-07 LAB — COMPREHENSIVE METABOLIC PANEL
ALT: 10 IU/L (ref 0–32)
AST: 13 IU/L (ref 0–40)
Albumin/Globulin Ratio: 2.1 (ref 1.2–2.2)
Albumin: 4.6 g/dL (ref 3.9–4.9)
Alkaline Phosphatase: 76 IU/L (ref 44–121)
BUN/Creatinine Ratio: 13 (ref 12–28)
BUN: 11 mg/dL (ref 8–27)
Bilirubin Total: 0.9 mg/dL (ref 0.0–1.2)
CO2: 23 mmol/L (ref 20–29)
Calcium: 10.7 mg/dL — ABNORMAL HIGH (ref 8.7–10.3)
Chloride: 100 mmol/L (ref 96–106)
Creatinine, Ser: 0.86 mg/dL (ref 0.57–1.00)
Globulin, Total: 2.2 g/dL (ref 1.5–4.5)
Glucose: 115 mg/dL — ABNORMAL HIGH (ref 70–99)
Potassium: 4.4 mmol/L (ref 3.5–5.2)
Sodium: 142 mmol/L (ref 134–144)
Total Protein: 6.8 g/dL (ref 6.0–8.5)
eGFR: 76 mL/min/{1.73_m2} (ref >59)

## 2022-06-07 LAB — TSH: TSH: 1.31 u[IU]/mL (ref 0.450–4.500)

## 2022-06-07 LAB — MICROSCOPIC EXAMINATION
Bacteria, UA: NONE SEEN
Casts: NONE SEEN /lpf
RBC, Urine: NONE SEEN /hpf (ref 0–2)

## 2022-06-07 LAB — MICROALBUMIN / CREATININE URINE RATIO
Creatinine, Urine: 70.3 mg/dL
Microalb/Creat Ratio: 9 mg/g creat (ref 0–29)
Microalbumin, Urine: 6 ug/mL

## 2022-06-07 LAB — LIPID PANEL W/O CHOL/HDL RATIO
Cholesterol, Total: 257 mg/dL — ABNORMAL HIGH (ref 100–199)
HDL: 132 mg/dL (ref >39)
LDL Chol Calc (NIH): 105 mg/dL — ABNORMAL HIGH (ref 0–99)
Triglycerides: 124 mg/dL (ref 0–149)
VLDL Cholesterol Cal: 20 mg/dL (ref 5–40)

## 2022-06-07 LAB — URINALYSIS, ROUTINE W REFLEX MICROSCOPIC
Bilirubin, UA: NEGATIVE
Ketones, UA: NEGATIVE
Nitrite, UA: NEGATIVE
Protein,UA: NEGATIVE
RBC, UA: NEGATIVE
Specific Gravity, UA: 1.017 (ref 1.005–1.030)
Urobilinogen, Ur: 0.2 mg/dL (ref 0.2–1.0)
pH, UA: 5 (ref 5.0–7.5)

## 2022-06-07 LAB — HGB A1C W/O EAG: Hgb A1c MFr Bld: 6.6 % — ABNORMAL HIGH (ref 4.8–5.6)

## 2022-06-07 LAB — VITAMIN D 25 HYDROXY (VIT D DEFICIENCY, FRACTURES): Vit D, 25-Hydroxy: 36.7 ng/mL (ref 30.0–100.0)

## 2022-06-07 NOTE — Telephone Encounter (Signed)
Spoke with patient and clarified and answer all questions she had regarding her labs and prescriptions. Patient verbalized understanding and has no further questions at this time.

## 2022-06-07 NOTE — Telephone Encounter (Signed)
Copied from [place]. Topic: General - Other >> Jun 07, 2022  1:08 PM Chapman Fitch wrote: Reason for CRM: Pt would like a call to discuss lab results

## 2022-06-15 ENCOUNTER — Telehealth: Payer: Self-pay

## 2022-06-15 NOTE — Telephone Encounter (Signed)
Sample signed out and ready for pick up.   Copied from [place] [id number]. Topic: General - Other >> Jun 15, 2022 11:08 AM [place] B wrote: Reason for CRM: Patient called in states she and her daugher will pick of sample of Ozempic on Monday since she can't drive right now

## 2022-06-18 DIAGNOSIS — M6281 Muscle weakness (generalized): Secondary | ICD-10-CM | POA: Diagnosis not present

## 2022-06-19 DIAGNOSIS — M25571 Pain in right ankle and joints of right foot: Secondary | ICD-10-CM | POA: Diagnosis not present

## 2022-06-19 DIAGNOSIS — S82851D Displaced trimalleolar fracture of right lower leg, subsequent encounter for closed fracture with routine healing: Secondary | ICD-10-CM | POA: Diagnosis not present

## 2022-06-21 DIAGNOSIS — G4733 Obstructive sleep apnea (adult) (pediatric): Secondary | ICD-10-CM | POA: Diagnosis not present

## 2022-06-22 ENCOUNTER — Telehealth: Payer: Self-pay | Admitting: *Deleted

## 2022-06-22 NOTE — Patient Outreach (Signed)
  Care Coordination   Follow Up Visit Note   06/22/2022 Name: Lisa Galvan MRN: [id number] DOB: [birth year]  Lisa Galvan is a 62 y.o. year old female who sees Lisa Roys, DO for primary care. I spoke with  Lisa Galvan by phone today.  What matters to the patients health and wellness today?  Ongoing recovery and healing from ankle surgery post fall/fracture.     Goals Addressed             This Visit's Progress    RNCM: Fall prevention and Safety   On track    Care Coordination Interventions: Provided written and verbal education re: potential causes of falls and Fall prevention strategies.  Using DME to decrease risk of falling.  Has knee scooter, 50% weight bearing Reviewed medications and discussed potential side effects of medications such as dizziness and frequent urination Advised patient of importance of notifying provider of falls Assessed for falls since last encounter. Last fall ended in broken ankle Assessed patients knowledge of fall risk prevention secondary to previously provided education. Review with patient to monitor for risk factors and fall risk.  Provided patient information for fall alert systems. Review and patient has a life alert system. She has not set it up yet as she just received. Encouraged her to set up the system, especially since she lives alone. Assessed working status of life alert bracelet and patient adherence Advised patient to discuss fall prevention and safety concerns with provider Patient interviewed about adult health maintenance status including  Falls risk assessment    Discussed attending outpatient PT once cleared by surgeon, plan to start on 3/11.  Has cast off post surgery, now wearing boot Upcoming appointments: 3/11 with PT and 3/26 with podiatry         SDOH assessments and interventions completed:  No     Care Coordination Interventions:  Yes, provided   Follow up plan: Follow up call scheduled for 3/22    Encounter  Outcome:  Pt. Visit Completed   Valente David, RN,MSN, [place] Management Care Management Coordinator [phone]

## 2022-06-27 ENCOUNTER — Other Ambulatory Visit: Payer: Self-pay | Admitting: Family Medicine

## 2022-06-27 DIAGNOSIS — E1140 Type 2 diabetes mellitus with diabetic neuropathy, unspecified: Secondary | ICD-10-CM

## 2022-06-27 NOTE — Telephone Encounter (Signed)
Unable to refill per protocol, Rx request is too soon. Last refill 06/06/22.  Requested Prescriptions  Pending Prescriptions Disp Refills   metFORMIN (GLUCOPHAGE) 500 MG tablet [Pharmacy Med Name: METFORMIN '500MG'$  TABLETS] 360 tablet     Sig: TAKE 2 TABLETS(1000 MG) BY MOUTH TWICE DAILY WITH A MEAL     Endocrinology:  Diabetes - Biguanides Failed - 06/27/2022  7:09 AM      Failed - B12 Level in normal range and within 720 days    Vitamin B-12  Date Value Ref Range Status  04/26/2021 1,766 (H) 232 - 1,245 pg/mL Final         Passed - Cr in normal range and within 360 days    Creatinine, Ser  Date Value Ref Range Status  06/06/2022 0.86 0.57 - 1.00 mg/dL Final         Passed - HBA1C is between 0 and 7.9 and within 180 days    HB A1C (BAYER DCA - WAIVED)  Date Value Ref Range Status  03/06/2022 7.2 (H) 4.8 - 5.6 % Final    Comment:             Prediabetes: 5.7 - 6.4          Diabetes: >6.4          Glycemic control for adults with diabetes: <7.0    Hgb A1c MFr Bld  Date Value Ref Range Status  06/06/2022 6.6 (H) 4.8 - 5.6 % Final    Comment:             Prediabetes: 5.7 - 6.4          Diabetes: >6.4          Glycemic control for adults with diabetes: <7.0          Passed - eGFR in normal range and within 360 days    GFR calc Af Amer  Date Value Ref Range Status  02/18/2020 89 >59 mL/min/1.73 Final    Comment:    **In accordance with recommendations from the NKF-ASN Task force,**   [facility] is in the process of updating its eGFR calculation to the   2021 CKD-EPI creatinine equation that estimates kidney function   without a race variable.    GFR, Estimated  Date Value Ref Range Status  05/05/2022 >60 >60 mL/min Final    Comment:    (NOTE) Calculated using the CKD-EPI Creatinine Equation (2021)    eGFR  Date Value Ref Range Status  06/06/2022 76 >59 mL/min/1.73 Final         Passed - Valid encounter within last 6 months    Recent Outpatient Visits            3 weeks ago Routine general medical examination at a health care facility   [place], [place] P, DO   2 months ago Appointment canceled by hospital   [place] M[place]um, Dani Gobble, PA-C   2 months ago Trimalleolar fracture of right ankle, closed, with nonunion, subsequent encounter   [place], Dani Gobble, PA-C   3 months ago [place], PA-C   3 months ago Type 2 diabetes mellitus with diabetic neuropathy, without long-term current use of insulin ([place])   [place], Barb Merino, DO       Future Appointments  In 2 months Johnson, Megan P, DO [place]            Passed - CBC within normal limits and completed in the last 12 months    WBC  Date Value Ref Range Status  06/06/2022 4.5 3.4 - 10.8 x10E3/uL Final  05/10/2022 5.5 4.0 - 10.5 K/uL Final   RBC  Date Value Ref Range Status  06/06/2022 4.60 3.77 - 5.28 x10E6/uL Final  05/10/2022 3.86 (L) 3.87 - 5.11 MIL/uL Final   Hemoglobin  Date Value Ref Range Status  06/06/2022 12.8 11.1 - 15.9 g/dL Final   Hematocrit  Date Value Ref Range Status  06/06/2022 39.8 34.0 - 46.6 % Final   MCHC  Date Value Ref Range Status  06/06/2022 32.2 31.5 - 35.7 g/dL Final  05/10/2022 32.4 30.0 - 36.0 g/dL Final   [facility]  Date Value Ref Range Status  06/06/2022 27.8 26.6 - 33.0 pg Final  05/10/2022 28.5 26.0 - 34.0 pg Final   MCV  Date Value Ref Range Status  06/06/2022 87 79 - 97 fL Final   No results found for: "PLTCOUNTKUC", "LABPLAT", "POCPLA" RDW  Date Value Ref Range Status  06/06/2022 16.9 (H) 11.7 - 15.4 % Final

## 2022-07-11 DIAGNOSIS — M25571 Pain in right ankle and joints of right foot: Secondary | ICD-10-CM | POA: Diagnosis not present

## 2022-07-11 DIAGNOSIS — Z9889 Other specified postprocedural states: Secondary | ICD-10-CM | POA: Diagnosis not present

## 2022-07-11 DIAGNOSIS — M6281 Muscle weakness (generalized): Secondary | ICD-10-CM | POA: Diagnosis not present

## 2022-07-11 DIAGNOSIS — M25671 Stiffness of right ankle, not elsewhere classified: Secondary | ICD-10-CM | POA: Diagnosis not present

## 2022-07-13 ENCOUNTER — Ambulatory Visit: Payer: Self-pay | Admitting: *Deleted

## 2022-07-13 NOTE — Patient Instructions (Signed)
Visit Information  Thank you for taking time to visit with me today. Please don't hesitate to contact me if I can be of assistance to you before our next scheduled telephone appointment.  Following are the goals we discussed today:  Keep going to PT sessions.   Do exercises at home to optimize strength.   Our next appointment is by telephone on 5/15  Please call the care guide team at [phone] if you need to cancel or reschedule your appointment.   Please call the Suicide and Crisis Lifeline: 988 call the [place] National Suicide Prevention Lifeline: 442-764-4228 or TTY: 512-742-9271 TTY 351-380-7379) to talk to a trained counselor call 1-800-273-TALK (toll free, 24 hour hotline) call 911 if you are experiencing a Mental Health or [place] or need someone to talk to.  Patient verbalizes understanding of instructions and care plan provided today and agrees to view in [place]. Active MyChart status and patient understanding of how to access instructions and care plan via MyChart confirmed with patient.     The patient has been provided with contact information for the care management team and has been advised to call with any health related questions or concerns.   Valente David, RN, MSN, [place] Care Management Care Management Coordinator [phone]

## 2022-07-13 NOTE — Patient Outreach (Signed)
  Care Coordination   07/13/2022 Name: Lisa Galvan MRN: [id number] DOB: [birth year]   Care Coordination Outreach Attempts:  An unsuccessful telephone outreach was attempted for a scheduled appointment today.  Follow Up Plan:  Additional outreach attempts will be made to offer the patient care coordination information and services.   Encounter Outcome:  No Answer   Care Coordination Interventions:  No, not indicated    Valente David, RN, MSN, [facility] Care Management Care Management Coordinator [phone]

## 2022-07-13 NOTE — Patient Outreach (Signed)
  Care Coordination   Follow Up Visit Note   07/13/2022 Name: Lisa Galvan MRN: [id number] DOB: 12-Oct-1960  Lisa Galvan is a 62 y.o. year old female who sees [name], DO for primary care. I spoke with  [name] by phone today.  What matters to the patients health and wellness today?  To regain use and strength in ankle post fall and surgery.    Goals Addressed             This Visit's Progress    RNCM: Fall prevention and Safety   On track    Care Coordination Interventions: Provided written and verbal education re: potential causes of falls and Fall prevention strategies.  Using DME to decrease risk of falling.  Has knee scooter, now 75% weight bearing Reviewed medications and discussed potential side effects of medications such as dizziness and frequent urination Advised patient of importance of notifying provider of falls Assessed for falls since last encounter. Last fall ended in broken ankle Assessed patients knowledge of fall risk prevention secondary to previously provided education. Review with patient to monitor for risk factors and fall risk.  Assessed working status of life alert bracelet and patient adherence Advised patient to discuss fall prevention and safety concerns with provider Patient interviewed about adult health maintenance status including  Falls risk assessment    Upcoming appointments:  3/26 with podiatry and PT, 5/14 with PCP         SDOH assessments and interventions completed:  No     Care Coordination Interventions:  Yes, provided   Interventions Today    Flowsheet Row Most Recent Value  Chronic Disease   Chronic disease during today's visit Other  [falls]  General Interventions   General Interventions Discussed/Reviewed General Interventions Reviewed, Doctor Visits  Doctor Visits Discussed/Reviewed Doctor Visits Reviewed, Specialist  PCP/Specialist Visits Compliance with follow-up visit  Education Interventions   Education  Provided Provided Education  Provided Verbal Education On When to see the doctor  Safety Interventions   Safety Discussed/Reviewed Home Safety  Home Safety Assistive Devices        Follow up plan: Follow up call scheduled for 5/15    Encounter Outcome:  Pt. Visit Completed   Valente David, RN, MSN, [place] Care Management Care Management Coordinator [phone]

## 2022-07-17 ENCOUNTER — Telehealth: Payer: Self-pay | Admitting: Family Medicine

## 2022-07-17 DIAGNOSIS — B070 Plantar wart: Secondary | ICD-10-CM | POA: Diagnosis not present

## 2022-07-17 DIAGNOSIS — S82851D Displaced trimalleolar fracture of right lower leg, subsequent encounter for closed fracture with routine healing: Secondary | ICD-10-CM | POA: Diagnosis not present

## 2022-07-17 DIAGNOSIS — E1142 Type 2 diabetes mellitus with diabetic polyneuropathy: Secondary | ICD-10-CM | POA: Diagnosis not present

## 2022-07-17 DIAGNOSIS — M25571 Pain in right ankle and joints of right foot: Secondary | ICD-10-CM | POA: Diagnosis not present

## 2022-07-17 DIAGNOSIS — M6281 Muscle weakness (generalized): Secondary | ICD-10-CM | POA: Diagnosis not present

## 2022-07-17 DIAGNOSIS — D2371 Other benign neoplasm of skin of right lower limb, including hip: Secondary | ICD-10-CM | POA: Diagnosis not present

## 2022-07-17 DIAGNOSIS — Z09 Encounter for follow-up examination after completed treatment for conditions other than malignant neoplasm: Secondary | ICD-10-CM | POA: Diagnosis not present

## 2022-07-17 NOTE — Telephone Encounter (Unsigned)
Copied from [place] [phone] [phone]. Topic: General - Other >> Jul 17, 2022 11:25 AM Cyndi Bender wrote: Reason for CRM: Pt stated the social worker Brayton Layman informed her that she sent Dr. Wynetta Emery a message regarding the Ozempic so she was calling to see if a Rx will be sent or if she will be given samples. Pt requests call back. Cb# [phone]

## 2022-07-18 ENCOUNTER — Other Ambulatory Visit: Payer: BC Managed Care – PPO

## 2022-07-18 ENCOUNTER — Ambulatory Visit
Admission: RE | Admit: 2022-07-18 | Discharge: 2022-07-18 | Disposition: A | Discharge status: Home / Self Care | Payer: BC Managed Care – PPO | Source: Ambulatory Visit | Attending: Family Medicine | Admitting: Family Medicine

## 2022-07-18 DIAGNOSIS — Z13820 Encounter for screening for osteoporosis: Secondary | ICD-10-CM

## 2022-07-18 DIAGNOSIS — Z780 Asymptomatic menopausal state: Secondary | ICD-10-CM | POA: Diagnosis not present

## 2022-07-18 NOTE — Telephone Encounter (Signed)
The patient is calling back to check on the statu of the Ozempic. Please assist patient as soon as possible

## 2022-07-19 MED ORDER — OZEMPIC (0.25 OR 0.5 MG/DOSE) 2 MG/3ML ~~LOC~~ SOPN: 0.2500 mg | PEN_INJECTOR | SUBCUTANEOUS | 1 refills | Status: DC

## 2022-07-19 NOTE — Telephone Encounter (Signed)
I don't have a message- can we please find out what she needs?

## 2022-07-19 NOTE — Telephone Encounter (Signed)
Rx sent to her pharmacy 

## 2022-07-19 NOTE — Telephone Encounter (Signed)
Spoke with patient and she says she was told by Dr Wynetta Emery that if she had any reactions to Ozempic to let our office know. Patient says she did not have any reactions to the medication and says she was to let Dr Wynetta Emery know that she is interested in starting the medication Ozempic. Please advise?

## 2022-07-31 DIAGNOSIS — S82851D Displaced trimalleolar fracture of right lower leg, subsequent encounter for closed fracture with routine healing: Secondary | ICD-10-CM | POA: Diagnosis not present

## 2022-07-31 DIAGNOSIS — B070 Plantar wart: Secondary | ICD-10-CM | POA: Diagnosis not present

## 2022-07-31 DIAGNOSIS — D2371 Other benign neoplasm of skin of right lower limb, including hip: Secondary | ICD-10-CM | POA: Diagnosis not present

## 2022-08-15 ENCOUNTER — Telehealth: Payer: Self-pay | Admitting: Pain Medicine

## 2022-08-15 NOTE — Telephone Encounter (Signed)
Spoke with Dr. Laban Emperor, instructed patient to go to ED or urgent care if the situation is acute. Patient states the fall occurred yesterday, she believes it may be related to her chronic pain. I instructed her to schedule appt with Dr. Laban Emperor.

## 2022-08-15 NOTE — Telephone Encounter (Signed)
Patient fell and is having the same kind of pain she had before when she got injections. Would like to speak with someone.

## 2022-08-17 DIAGNOSIS — M6281 Muscle weakness (generalized): Secondary | ICD-10-CM | POA: Diagnosis not present
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
## 2022-08-20 ENCOUNTER — Encounter: Payer: Self-pay | Admitting: Pain Medicine

## 2022-08-20 ENCOUNTER — Ambulatory Visit: Payer: BC Managed Care – PPO | Attending: Pain Medicine | Admitting: Pain Medicine

## 2022-08-20 VITALS — BP 134/97 | HR 95 | Temp 97.0°F | Ht 63.0 in | Wt 160.0 lb

## 2022-08-20 DIAGNOSIS — S32039S Unspecified fracture of third lumbar vertebra, sequela: Secondary | ICD-10-CM | POA: Diagnosis not present

## 2022-08-20 DIAGNOSIS — M47816 Spondylosis without myelopathy or radiculopathy, lumbar region: Secondary | ICD-10-CM | POA: Diagnosis not present

## 2022-08-20 DIAGNOSIS — M25552 Pain in left hip: Secondary | ICD-10-CM | POA: Diagnosis not present

## 2022-08-20 DIAGNOSIS — M2428 Disorder of ligament, vertebrae: Secondary | ICD-10-CM | POA: Insufficient documentation

## 2022-08-20 DIAGNOSIS — M4310 Spondylolisthesis, site unspecified: Secondary | ICD-10-CM | POA: Insufficient documentation

## 2022-08-20 DIAGNOSIS — M2540 Effusion, unspecified joint: Secondary | ICD-10-CM | POA: Diagnosis not present

## 2022-08-20 DIAGNOSIS — M5136 Other intervertebral disc degeneration, lumbar region: Secondary | ICD-10-CM | POA: Insufficient documentation

## 2022-08-20 DIAGNOSIS — G8929 Other chronic pain: Secondary | ICD-10-CM

## 2022-08-20 DIAGNOSIS — R937 Abnormal findings on diagnostic imaging of other parts of musculoskeletal system: Secondary | ICD-10-CM | POA: Diagnosis not present

## 2022-08-20 DIAGNOSIS — M25551 Pain in right hip: Secondary | ICD-10-CM | POA: Diagnosis not present

## 2022-08-20 DIAGNOSIS — S22089S Unspecified fracture of T11-T12 vertebra, sequela: Secondary | ICD-10-CM

## 2022-08-20 DIAGNOSIS — M47819 Spondylosis without myelopathy or radiculopathy, site unspecified: Secondary | ICD-10-CM | POA: Diagnosis not present

## 2022-08-20 DIAGNOSIS — M545 Low back pain, unspecified: Secondary | ICD-10-CM | POA: Insufficient documentation

## 2022-08-20 DIAGNOSIS — M47817 Spondylosis without myelopathy or radiculopathy, lumbosacral region: Secondary | ICD-10-CM

## 2022-08-20 DIAGNOSIS — M5417 Radiculopathy, lumbosacral region: Secondary | ICD-10-CM | POA: Diagnosis not present

## 2022-08-20 NOTE — Patient Instructions (Signed)

## 2022-08-23 ENCOUNTER — Encounter: Payer: Self-pay | Admitting: Pain Medicine

## 2022-08-23 ENCOUNTER — Ambulatory Visit: Payer: BC Managed Care – PPO | Attending: Pain Medicine | Admitting: Pain Medicine

## 2022-08-23 ENCOUNTER — Ambulatory Visit
Admission: RE | Admit: 2022-08-23 | Discharge: 2022-08-23 | Disposition: A | Discharge status: Home / Self Care | Payer: BC Managed Care – PPO | Source: Ambulatory Visit | Attending: Pain Medicine | Admitting: Pain Medicine

## 2022-08-23 VITALS — BP 121/99 | HR 89 | Temp 96.9°F | Resp 17 | Ht 63.0 in | Wt 165.0 lb

## 2022-08-23 DIAGNOSIS — M79604 Pain in right leg: Secondary | ICD-10-CM | POA: Diagnosis not present

## 2022-08-23 DIAGNOSIS — M2540 Effusion, unspecified joint: Secondary | ICD-10-CM | POA: Insufficient documentation

## 2022-08-23 DIAGNOSIS — S32039S Unspecified fracture of third lumbar vertebra, sequela: Secondary | ICD-10-CM

## 2022-08-23 DIAGNOSIS — M5136 Other intervertebral disc degeneration, lumbar region: Secondary | ICD-10-CM | POA: Diagnosis not present

## 2022-08-23 DIAGNOSIS — M4310 Spondylolisthesis, site unspecified: Secondary | ICD-10-CM

## 2022-08-23 DIAGNOSIS — M2428 Disorder of ligament, vertebrae: Secondary | ICD-10-CM

## 2022-08-23 DIAGNOSIS — S32030S Wedge compression fracture of third lumbar vertebra, sequela: Secondary | ICD-10-CM

## 2022-08-23 DIAGNOSIS — M79605 Pain in left leg: Secondary | ICD-10-CM | POA: Insufficient documentation

## 2022-08-23 DIAGNOSIS — M47819 Spondylosis without myelopathy or radiculopathy, site unspecified: Secondary | ICD-10-CM | POA: Diagnosis not present

## 2022-08-23 DIAGNOSIS — G8929 Other chronic pain: Secondary | ICD-10-CM | POA: Diagnosis not present

## 2022-08-23 DIAGNOSIS — M51369 Other intervertebral disc degeneration, lumbar region without mention of lumbar back pain or lower extremity pain: Secondary | ICD-10-CM

## 2022-08-23 MED ORDER — SODIUM CHLORIDE (PF) 0.9 % IJ SOLN
INTRAMUSCULAR | Status: AC
  Filled 2022-08-23: qty 10

## 2022-08-23 MED ORDER — LIDOCAINE HCL 2 % IJ SOLN
INTRAMUSCULAR | Status: AC
  Filled 2022-08-23: qty 20

## 2022-08-23 MED ORDER — ROPIVACAINE HCL 2 MG/ML IJ SOLN
2.0000 mL | Freq: Once | INTRAMUSCULAR | Status: AC
  Administered 2022-08-23: 2 mL via EPIDURAL
  Filled 2022-08-23: qty 20

## 2022-08-23 MED ORDER — LIDOCAINE HCL 2 % IJ SOLN
20.0000 mL | Freq: Once | INTRAMUSCULAR | Status: AC
  Administered 2022-08-23: 400 mg

## 2022-08-23 MED ORDER — TRIAMCINOLONE ACETONIDE 40 MG/ML IJ SUSP
40.0000 mg | Freq: Once | INTRAMUSCULAR | Status: AC
  Administered 2022-08-23: 40 mg
  Filled 2022-08-23: qty 1

## 2022-08-23 MED ORDER — PENTAFLUOROPROP-TETRAFLUOROETH EX AERO
INHALATION_SPRAY | Freq: Once | CUTANEOUS | Status: AC
  Administered 2022-08-23: 30 via TOPICAL
  Filled 2022-08-23: qty 116

## 2022-08-23 MED ORDER — SODIUM CHLORIDE 0.9% FLUSH
2.0000 mL | Freq: Once | INTRAVENOUS | Status: AC
  Administered 2022-08-23: 2 mL

## 2022-08-23 NOTE — Progress Notes (Signed)
PROVIDER NOTE: Interpretation of information contained herein should be left to medically-trained personnel. Specific patient instructions are provided elsewhere under "Patient Instructions" section of medical record. This document was created in part using STT-dictation technology, any transcriptional errors that may result from this process are unintentional.  Patient: Lisa Galvan Type: Established DOB: 1960-12-03 MRN: [id number] PCP: Dorcas Carrow, DO  Service: Procedure DOS: 08/23/2022 Setting: Ambulatory Location: Ambulatory outpatient facility Delivery: Face-to-face Provider: Oswaldo Done, MD Specialty: Interventional Pain Management Specialty designation: 09 Location: Outpatient facility Ref. Prov.: Olevia Perches P, DO       Interventional Therapy   Procedure: Lumbar epidural steroid injection (LESI) (interlaminar) #1    Laterality: Right   Level:  L4-5 Level.  Imaging: Fluoroscopic guidance         Anesthesia: Local anesthesia (1-2% Lidocaine) Anxiolysis: None                 Sedation: No Sedation                       DOS: 08/23/2022  Performed by: Oswaldo Done, MD  Purpose: Diagnostic/Therapeutic Indications: Lumbar radicular pain of intraspinal etiology of more than 4 weeks that has failed to respond to conservative therapy and is severe enough to impact quality of life or function. 1. DDD (degenerative disc disease), lumbar   2. Chronic lower extremity pain (2ry area of Pain) (Bilateral)   3. Closed compression fracture of L3 lumbar vertebra, sequela   4. Grade 1 Anterolisthesis of L4/5   5. L3 superior endplate closed fracture, sequela   6. Lumbar facet effusion/edema (L4-5, L5-S1)   7. Ligamentum flavum hypertrophy (L4-5)    NAS-11 Pain score:   Pre-procedure: 7 /10   Post-procedure: 4 /10      Position / Prep / Materials:  Position: Prone w/ head of the table raised (slight reverse trendelenburg) to facilitate breathing.  Prep solution:  DuraPrep (Iodine Povacrylex [0.7% available iodine] and Isopropyl Alcohol, 74% w/w) Prep Area: Entire Posterior Lumbar Region from lower scapular tip down to mid buttocks area and from flank to flank. Materials:  Tray: Epidural tray Needle(s):  Type: Epidural needle (Tuohy) Gauge (G):  17 Length: Regular (3.5-in) Qty: 1   Pre-op H&P Assessment:  Lisa Galvan is a 62 y.o. (year old), female patient, seen today for interventional treatment. She  has a past surgical history that includes Esophagogastroduodenoscopy (egd) with propofol (N/A, 03/12/2017); Umbilical hernia repair (N/A, 05/01/2017); Hernia repair; Esophagogastroduodenoscopy (egd) with propofol (N/A, 03/30/2019); Colonoscopy with propofol (N/A, 03/30/2019); ORIF ankle fracture (Left, 07/30/2019); XI robotic assisted ventral hernia (N/A, 10/06/2019); Incisional hernia repair (N/A, 11/29/2020); Supracervical abdominal hysterectomy; Abdominal hysterectomy; Ankle Closed Reduction (Right, 04/15/2022); and ORIF ankle fracture (Right, 05/03/2022). Lisa Galvan has a current medication list which includes the following prescription(s): accu-chek softclix lancets, aspirin ec, atorvastatin, vitamin d-3, duloxetine, epinephrine, fexofenadine, gabapentin, magnesium gluconate, metformin, metoprolol succinate, omeprazole, ozempic (0.25 or 0.5 mg/dose), sucralfate, cyanocobalamin, and enoxaparin. Her primarily concern today is the Back Pain (coccyx)  Initial Vital Signs:  Pulse/HCG Rate: 88  Temp: (!) 97.2 F (36.2 C) Resp: 16 BP: (!) 130/92 SpO2: 98 %  BMI: Estimated body mass index is 29.23 kg/m as calculated from the following:   Height as of this encounter: 5\' 3"  (1.6 m).   Weight as of this encounter: 165 lb (74.8 kg).  Risk Assessment: Allergies: Reviewed. She is allergic to benazepril, ivp dye [iodinated contrast media], lidoderm [lidocaine], lyrica [pregabalin], shellfish allergy, shellfish-derived  products, trulicity [dulaglutide], and ace  inhibitors.  Allergy Precautions: None required Coagulopathies: Reviewed. None identified.  Blood-thinner therapy: None at this time Active Infection(s): Reviewed. None identified. Lisa Galvan is afebrile  Site Confirmation: Lisa Galvan was asked to confirm the procedure and laterality before marking the site Procedure checklist: Completed Consent: Before the procedure and under the influence of no sedative(s), amnesic(s), or anxiolytics, the patient was informed of the treatment options, risks and possible complications. To fulfill our ethical and legal obligations, as recommended by the American Medical Association's Code of Ethics, I have informed the patient of my clinical impression; the nature and purpose of the treatment or procedure; the risks, benefits, and possible complications of the intervention; the alternatives, including doing nothing; the risk(s) and benefit(s) of the alternative treatment(s) or procedure(s); and the risk(s) and benefit(s) of doing nothing. The patient was provided information about the general risks and possible complications associated with the procedure. These may include, but are not limited to: failure to achieve desired goals, infection, bleeding, organ or nerve damage, allergic reactions, paralysis, and death. In addition, the patient was informed of those risks and complications associated to Spine-related procedures, such as failure to decrease pain; infection (i.e.: Meningitis, epidural or intraspinal abscess); bleeding (i.e.: epidural hematoma, subarachnoid hemorrhage, or any other type of intraspinal or peri-dural bleeding); organ or nerve damage (i.e.: Any type of peripheral nerve, nerve root, or spinal cord injury) with subsequent damage to sensory, motor, and/or autonomic systems, resulting in permanent pain, numbness, and/or weakness of one or several areas of the body; allergic reactions; (i.e.: anaphylactic reaction); and/or death. Furthermore, the patient was  informed of those risks and complications associated with the medications. These include, but are not limited to: allergic reactions (i.e.: anaphylactic or anaphylactoid reaction(s)); adrenal axis suppression; blood sugar elevation that in diabetics may result in ketoacidosis or comma; water retention that in patients with history of congestive heart failure may result in shortness of breath, pulmonary edema, and decompensation with resultant heart failure; weight gain; swelling or edema; medication-induced neural toxicity; particulate matter embolism and blood vessel occlusion with resultant organ, and/or nervous system infarction; and/or aseptic necrosis of one or more joints. Finally, the patient was informed that Medicine is not an exact science; therefore, there is also the possibility of unforeseen or unpredictable risks and/or possible complications that may result in a catastrophic outcome. The patient indicated having understood very clearly. We have given the patient no guarantees and we have made no promises. Enough time was given to the patient to ask questions, all of which were answered to the patient's satisfaction. Lisa Galvan has indicated that she wanted to continue with the procedure. Attestation: I, the ordering provider, attest that I have discussed with the patient the benefits, risks, side-effects, alternatives, likelihood of achieving goals, and potential problems during recovery for the procedure that I have provided informed consent. Date  Time: 08/23/2022 10:53 AM   Pre-Procedure Preparation:  Monitoring: As per clinic protocol. Respiration, ETCO2, SpO2, BP, heart rate and rhythm monitor placed and checked for adequate function Safety Precautions: Patient was assessed for positional comfort and pressure points before starting the procedure. Time-out: I initiated and conducted the "Time-out" before starting the procedure, as per protocol. The patient was asked to participate by  confirming the accuracy of the "Time Out" information. Verification of the correct person, site, and procedure were performed and confirmed by me, the nursing staff, and the patient. "Time-out" conducted as per Joint Commission's Universal Protocol (UP.01.01.01). Time:  1126 Start Time: 1126 hrs.  Description/Narrative of Procedure:          Target: Epidural space via interlaminar opening, initially targeting the lower laminar border of the superior vertebral body. Region: Lumbar Approach: Percutaneous paravertebral  Rationale (medical necessity): procedure needed and proper for the diagnosis and/or treatment of the patient's medical symptoms and needs. Procedural Technique Safety Precautions: Aspiration looking for blood return was conducted prior to all injections. At no point did we inject any substances, as a needle was being advanced. No attempts were made at seeking any paresthesias. Safe injection practices and needle disposal techniques used. Medications properly checked for expiration dates. SDV (single dose vial) medications used. Description of the Procedure: Protocol guidelines were followed. The procedure needle was introduced through the skin, ipsilateral to the reported pain, and advanced to the target area. Bone was contacted and the needle walked caudad, until the lamina was cleared. The epidural space was identified using "loss-of-resistance technique" with 2-3 ml of PF-NaCl (0.9% NSS), in a 5cc LOR glass syringe.  Vitals:   08/23/22 1100 08/23/22 1120 08/23/22 1129 08/23/22 1134  BP: 111/86 (!) 119/93 (!) 124/101 (!) 121/99  Pulse: 87 99 99 89  Resp:  17 16 17   Temp: (!) 96.9 F (36.1 C)     TempSrc: Temporal     SpO2: 100% 98% 99% 100%  Weight:      Height:        Start Time: 1126 hrs. End Time: 1130 hrs.  Imaging Guidance (Spinal):          Type of Imaging Technique: Fluoroscopy Guidance (Spinal) Indication(s): Assistance in needle guidance and placement for  procedures requiring needle placement in or near specific anatomical locations not easily accessible without such assistance. Exposure Time: Please see nurses notes. Contrast: None used. Fluoroscopic Guidance: I was personally present during the use of fluoroscopy. "Tunnel Vision Technique" used to obtain the best possible view of the target area. Parallax error corrected before commencing the procedure. "Direction-depth-direction" technique used to introduce the needle under continuous pulsed fluoroscopy. Once target was reached, antero-posterior, oblique, and lateral fluoroscopic projection used confirm needle placement in all planes. Images permanently stored in EMR. Interpretation: No contrast injected. I personally interpreted the imaging intraoperatively. Adequate needle placement confirmed in multiple planes. Permanent images saved into the patient's record.  Antibiotic Prophylaxis:   Anti-infectives (From admission, onward)    None      Indication(s): None identified  Post-operative Assessment:  Post-procedure Vital Signs:  Pulse/HCG Rate: 89  Temp: (!) 96.9 F (36.1 C) Resp: 17 BP: (!) 121/99 SpO2: 100 %  EBL: None  Complications: No immediate post-treatment complications observed by team, or reported by patient.  Note: The patient tolerated the entire procedure well. A repeat set of vitals were taken after the procedure and the patient was kept under observation following institutional policy, for this type of procedure. Post-procedural neurological assessment was performed, showing return to baseline, prior to discharge. The patient was provided with post-procedure discharge instructions, including a section on how to identify potential problems. Should any problems arise concerning this procedure, the patient was given instructions to immediately contact us, at any time, without hesitation. In any case, we plan to contact the patient by telephone for a follow-up status report  regarding this interventional procedure.  Comments:  No additional relevant information.  Plan of Care (POC)  Orders:  Orders Placed This Encounter  Procedures   Lumbar Epidural Injection    Scheduling Instructions:  Procedure: Interlaminar LESI L4-5     Laterality: Right     Sedation: Patient's choice     Timeframe: Today    Order Specific Question:   Where will this procedure be performed?    Answer:   [facility]   DG PAIN [facility] C-ARM 1-60 MIN NO REPORT    Intraoperative interpretation by procedural physician at [facility].    Standing Status:   Standing    Number of Occurrences:   1    Order Specific Question:   Reason for exam:    Answer:   Assistance in needle guidance and placement for procedures requiring needle placement in or near specific anatomical locations not easily accessible without such assistance.   Informed Consent Details: Physician/Practitioner Attestation; Transcribe to consent form and obtain patient signature    Note: Always confirm laterality of pain with Lisa Galvan, before procedure. Transcribe to consent form and obtain patient signature.    Order Specific Question:   Physician/Practitioner attestation of informed consent for procedure/surgical case    Answer:   I, the physician/practitioner, attest that I have discussed with the patient the benefits, risks, side effects, alternatives, likelihood of achieving goals and potential problems during recovery for the procedure that I have provided informed consent.    Order Specific Question:   Procedure    Answer:   Lumbar epidural steroid injection under fluoroscopic guidance    Order Specific Question:   Physician/Practitioner performing the procedure    Answer:   Dollye Glasser A. Laban Emperor, MD    Order Specific Question:   Indication/Reason    Answer:   Low back and/or lower extremity pain secondary to lumbar radiculitis   Provide equipment / supplies at bedside    Procedural tray: Epidural  Tray (Disposable  single use) Skin infiltration needle: Regular 1.5-in, 25-G, (x1) Block needle size: Regular standard Catheter: No catheter required    Standing Status:   Standing    Number of Occurrences:   1    Order Specific Question:   Specify    Answer:   Epidural Tray   Chronic Opioid Analgesic:  No opioid analgesics prescribed by our practice.   Medications ordered for procedure: Meds ordered this encounter  Medications   lidocaine (XYLOCAINE) 2 % (with pres) injection 400 mg   pentafluoroprop-tetrafluoroeth (GEBAUERS) aerosol   sodium chloride flush (NS) 0.9 % injection 2 mL   ropivacaine (PF) 2 mg/mL (0.2%) (NAROPIN) injection 2 mL   triamcinolone acetonide (KENALOG-40) injection 40 mg   Medications administered: We administered lidocaine, pentafluoroprop-tetrafluoroeth, sodium chloride flush, ropivacaine (PF) 2 mg/mL (0.2%), and triamcinolone acetonide.  See the medical record for exact dosing, route, and time of administration.  Follow-up plan:   Return in about 2 weeks (around 09/06/2022) for Proc-day (T,Th), (Face2F), (PPE).       Interventional Therapies  Risk  Complexity Considerations:   Estimated body mass index is 26.57 kg/m as calculated from the following:   Height as of 01/31/21: 5\' 3"  (1.6 m).   Weight as of 01/31/21: 150 lb (68 kg). NOTE: IVP DYE ALLERGY - ANAPHYLAXIS IODINE ALLERGY - ANAPHYLAXIS SHELLFISH ALLERGY - ANAPHYLAXIS   Planned  Pending:      Under consideration:   Therapeutic bilateral lumbar facet RFA #2  Therapeutic left L4-5 LESI #3 (+ bilateral L5 TFESI #3 - Denied by BCBS) Therapeutic caudal ESI #3  Therapeutic Qutenza treatment of her diabetic peripheral neuropathy.   Completed:   Diagnostic/therapeutic right CESI x1 (08/03/2021) (100/100/80/80)  Palliative  right lumbar facet RFA x1 (06/02/2019) (100/100/95)  Palliative left lumbar facet RFA x1 (06/30/2019) (100/100/95-100)  Palliative bilateral lumbar facet MBB x2  (04/21/2019) (100/100/40/40)  Palliative right sacroiliac joint Blk x1 (04/21/2019) (100/100/60/40)  Diagnostic caudal (Midline to right) ESI x2 (07/04/2021) (75/75/75/75)  Diagnostic/therapeutic bilateral L5 TFESI x2 (10/31/2021) (LBP:100/100/100/100) (LEP:100/100/80/80)  Diagnostic/therapeutic left L4-5 LESI x3 (02/13/2022) (LBP:100/100/100/100) (LEP:100/100/80/80)    Completed by other providers:   Diagnostic/therapeutic right L4-5 LESI x1 (12/29/2014) by Dr. Ewing Schlein  CT injections of wrist (?)  EMG/PNCV (LE) (03/20/2017) Dx: (R) sural sensory peripheral neuropathy  EMG/PNCV (LE) (03/02/2019) Dx: generalized sensory polyneuropathy, no lumbar radiculopathy  EMG/PNCV (UE) (07/25/2021) Dx: generalized sensory polyneuropathy, superimposed (B) carpal tunnel syndrome    Therapeutic  Palliative (PRN) options:   Therapeutic L4-5 LESI + L5 TFESI  Palliative lumbar facet RFA  Palliative lumbar facet MBB  Palliative sacroiliac joint Blk  Diagnostic caudal ESI    Pharmacotherapy  Nonopioids transferred 02/01/2020: Lyrica, magnesium       Recent Visits Date Type Provider Dept  08/20/22 Office Visit Delano Metz, MD [facility]  Showing recent visits within past 90 days and meeting all other requirements Today's Visits Date Type Provider Dept  08/23/22 Procedure visit Delano Metz, MD [facility]  Showing today's visits and meeting all other requirements Future Appointments Date Type Provider Dept  09/27/22 Appointment Delano Metz, MD [facility]  Showing future appointments within next 90 days and meeting all other requirements  Disposition: Discharge home  Discharge (Date  Time): 08/23/2022; 1137 hrs.   Primary Care Physician: Dorcas Carrow, DO Location: [facility] Note by: Oswaldo Done, MD (TTS technology used. I apologize for any typographical errors that were not detected and  corrected.) Date: 08/23/2022; Time: 11:49 AM  Disclaimer:  Medicine is not an Visual merchandiser. The only guarantee in medicine is that nothing is guaranteed. It is important to note that the decision to proceed with this intervention was based on the information collected from the patient. The Data and conclusions were drawn from the patient's questionnaire, the interview, and the physical examination. Because the information was provided in large part by the patient, it cannot be guaranteed that it has not been purposely or unconsciously manipulated. Every effort has been made to obtain as much relevant data as possible for this evaluation. It is important to note that the conclusions that lead to this procedure are derived in large part from the available data. Always take into account that the treatment will also be dependent on availability of resources and existing treatment guidelines, considered by other Pain Management Practitioners as being common knowledge and practice, at the time of the intervention. For Medico-Legal purposes, it is also important to point out that variation in procedural techniques and pharmacological choices are the acceptable norm. The indications, contraindications, technique, and results of the above procedure should only be interpreted and judged by a Board-Certified Interventional Pain Specialist with extensive familiarity and expertise in the same exact procedure and technique.

## 2022-08-23 NOTE — Progress Notes (Signed)
Safety precautions to be maintained throughout the outpatient stay will include: orient to surroundings, keep bed in low position, maintain call bell within reach at all times, provide assistance with transfer out of bed and ambulation.  

## 2022-08-23 NOTE — Patient Instructions (Signed)

## 2022-08-24 ENCOUNTER — Telehealth: Payer: Self-pay | Admitting: *Deleted

## 2022-08-24 DIAGNOSIS — G4733 Obstructive sleep apnea (adult) (pediatric): Secondary | ICD-10-CM | POA: Diagnosis not present

## 2022-08-24 NOTE — Telephone Encounter (Signed)
Post procedure call;  no questions or concerns. The right side, which is the side that was worked on yesterday is doing great but she can tell that the left is also hurting now and will probably want to have that side done next.

## 2022-09-03 ENCOUNTER — Other Ambulatory Visit: Payer: BC Managed Care – PPO

## 2022-09-04 ENCOUNTER — Ambulatory Visit (INDEPENDENT_AMBULATORY_CARE_PROVIDER_SITE_OTHER): Payer: BC Managed Care – PPO | Admitting: Family Medicine

## 2022-09-04 ENCOUNTER — Encounter: Payer: Self-pay | Admitting: Family Medicine

## 2022-09-04 VITALS — BP 139/78 | HR 88 | Temp 97.9°F | Wt 159.3 lb

## 2022-09-04 DIAGNOSIS — E1140 Type 2 diabetes mellitus with diabetic neuropathy, unspecified: Secondary | ICD-10-CM | POA: Diagnosis not present

## 2022-09-04 LAB — BAYER DCA HB A1C WAIVED: HB A1C (BAYER DCA - WAIVED): 6.5 % — ABNORMAL HIGH (ref 4.8–5.6)

## 2022-09-04 MED ORDER — EPINEPHRINE 0.3 MG/0.3ML IJ SOAJ: 0.3000 mg | INTRAMUSCULAR | 12 refills | Status: DC | PRN

## 2022-09-04 NOTE — Assessment & Plan Note (Signed)
Under good control with A1c of 6.5. Continue current regimen. Continue to monitor. Call with any concerns. Recheck 3 months.

## 2022-09-04 NOTE — Progress Notes (Signed)
BP 139/78   Pulse 88   Temp 97.9 F (36.6 C) (Oral)   Wt 159 lb 4.8 oz (72.3 kg)   SpO2 97%   BMI 28.22 kg/m    Subjective:    Patient ID: Lisa Galvan, female    DOB: Mar 19, 1961, 62 y.o.   MRN: [id number]  HPI: Lisa Galvan is a 62 y.o. female  Chief Complaint  Patient presents with   Diabetes   DIABETES Hypoglycemic episodes:no Polydipsia/polyuria: no Visual disturbance: no Chest pain: no Paresthesias: yes Glucose Monitoring: yes  Accucheck frequency: occasionally Taking Insulin?: no Blood Pressure Monitoring: not checking Retinal Examination: Up to Date Foot Exam: Up to Date Diabetic Education: Completed Pneumovax: Up to Date Influenza: Up to Date Aspirin: yes  Relevant past medical, surgical, family and social history reviewed and updated as indicated. Interim medical history since our last visit reviewed. Allergies and medications reviewed and updated.  Review of Systems  Constitutional: Negative.   Respiratory: Negative.    Cardiovascular: Negative.   Gastrointestinal: Negative.   Musculoskeletal: Negative.   Neurological: Negative.   Psychiatric/Behavioral: Negative.      Per HPI unless specifically indicated above     Objective:    BP 139/78   Pulse 88   Temp 97.9 F (36.6 C) (Oral)   Wt 159 lb 4.8 oz (72.3 kg)   SpO2 97%   BMI 28.22 kg/m   Wt Readings from Last 3 Encounters:  09/04/22 159 lb 4.8 oz (72.3 kg)  08/23/22 165 lb (74.8 kg)  08/20/22 160 lb (72.6 kg)    Physical Exam Vitals and nursing note reviewed.  Constitutional:      General: She is not in acute distress.    Appearance: Normal appearance. She is normal weight. She is not ill-appearing, toxic-appearing or diaphoretic.  HENT:     Head: Normocephalic and atraumatic.     Right Ear: External ear normal.     Left Ear: External ear normal.     Nose: Nose normal.     Mouth/Throat:     Mouth: Mucous membranes are moist.     Pharynx: Oropharynx is clear.  Eyes:      General: No scleral icterus.       Right eye: No discharge.        Left eye: No discharge.     Extraocular Movements: Extraocular movements intact.     Conjunctiva/sclera: Conjunctivae normal.     Pupils: Pupils are equal, round, and reactive to light.  Cardiovascular:     Rate and Rhythm: Normal rate and regular rhythm.     Pulses: Normal pulses.     Heart sounds: Normal heart sounds. No murmur heard.    No friction rub. No gallop.  Pulmonary:     Effort: Pulmonary effort is normal. No respiratory distress.     Breath sounds: Normal breath sounds. No stridor. No wheezing, rhonchi or rales.  Chest:     Chest wall: No tenderness.  Musculoskeletal:        General: Normal range of motion.     Cervical back: Normal range of motion and neck supple.  Skin:    General: Skin is warm and dry.     Capillary Refill: Capillary refill takes less than 2 seconds.     Coloration: Skin is not jaundiced or pale.     Findings: No bruising, erythema, lesion or rash.  Neurological:     General: No focal deficit present.     Mental Status: She is alert  and oriented to person, place, and time. Mental status is at baseline.  Psychiatric:        Mood and Affect: Mood normal.        Behavior: Behavior normal.        Thought Content: Thought content normal.        Judgment: Judgment normal.     Results for orders placed or performed in visit on 09/04/22  Bayer DCA Hb A1c Waived  Result Value Ref Range   HB A1C (BAYER DCA - WAIVED) 6.5 (H) 4.8 - 5.6 %      Assessment & Plan:   Problem List Items Addressed This Visit       Endocrine   Type 2 diabetes mellitus with diabetic neuropathy, unspecified (HCC) - Primary    Under good control with A1c of 6.5. Continue current regimen. Continue to monitor. Call with any concerns. Recheck 3 months.       Relevant Orders   Bayer DCA Hb A1c Waived (Completed)     Follow up plan: Return in about 3 months (around 12/05/2022).

## 2022-09-05 ENCOUNTER — Ambulatory Visit: Payer: Self-pay | Admitting: *Deleted

## 2022-09-05 NOTE — Patient Outreach (Signed)
  Care Coordination   Follow Up Visit Note   09/05/2022 Name: Lisa Galvan MRN: [id number] DOB: [birth year]  Lisa Galvan is a 62 y.o. year old female who sees Dorcas Carrow, DO for primary care. I spoke with  [name] by phone today.  What matters to the patients health and wellness today?  Continue recovering from ankle surgery.      Goals Addressed             This Visit's Progress    RNCM: Fall prevention and Safety   On track    Care Coordination Interventions: Provided written and verbal education re: potential causes of falls and Fall prevention strategies.  Reviewed medications and discussed potential side effects of medications such as dizziness and frequent urination Advised patient of importance of notifying provider of falls Assessed for falls since last encounter. Last fall ended in broken ankle Assessed patients knowledge of fall risk prevention secondary to previously provided education. Review with patient to monitor for risk factors and fall risk.  Assessed working status of life alert bracelet and patient adherence Advised patient to discuss fall prevention and safety concerns with provider Patient interviewed about adult health maintenance status including  Falls risk assessment   - had another fall last week, uses rollator and cane          SDOH assessments and interventions completed:  No     Care Coordination Interventions:  Yes, provided   Interventions Today    Flowsheet Row Most Recent Value  Chronic Disease   Chronic disease during today's visit Other  [fall and ankle surgery]  General Interventions   General Interventions Discussed/Reviewed General Interventions Reviewed, Doctor Visits  Doctor Visits Discussed/Reviewed Doctor Visits Reviewed, PCP, Specialist  [podiatry follow up 5/21, will discuss increasing exercise.  Unable to participate in therapy sessions, unable to afford.  Pain clinic appointment 6/6, PCP on 8/14]  PCP/Specialist  Visits Compliance with follow-up visit  [Was seen by PCP yesterday]  Education Interventions   Education Provided Provided Education  Provided Verbal Education On Blood Sugar Monitoring, Labs, When to see the doctor  Labs Reviewed Hgb A1c  [recent A1C is 6.5, at goal]       Follow up plan: Follow up call scheduled for 7/10    Encounter Outcome:  Pt. Visit Completed   Kemper Durie, RN, MSN, [facility] Care Management Care Management Coordinator [phone]

## 2022-09-07 ENCOUNTER — Other Ambulatory Visit: Payer: Self-pay | Admitting: Family Medicine

## 2022-09-07 DIAGNOSIS — M792 Neuralgia and neuritis, unspecified: Secondary | ICD-10-CM

## 2022-09-07 NOTE — Telephone Encounter (Signed)
Unable to refill per protocol, Rx request is too soon. Last refill 06/06/22 for 90 and 1 refill.  Requested Prescriptions  Pending Prescriptions Disp Refills   DULoxetine (CYMBALTA) 60 MG capsule [Pharmacy Med Name: DULOXETINE DR 60MG  CAPSULES] 90 capsule 1    Sig: TAKE 1 CAPSULE BY MOUTH DAILY WITH 30 MG     Psychiatry: Antidepressants - SNRI - duloxetine Passed - 09/07/2022  2:46 PM      Passed - Cr in normal range and within 360 days    Creatinine, Ser  Date Value Ref Range Status  06/06/2022 0.86 0.57 - 1.00 mg/dL Final         Passed - eGFR is 30 or above and within 360 days    GFR calc Af Amer  Date Value Ref Range Status  02/18/2020 89 >59 mL/min/1.73 Final    Comment:    **In accordance with recommendations from the NKF-ASN Task force,**   [facility] is in the process of updating its eGFR calculation to the   2021 CKD-EPI creatinine equation that estimates kidney function   without a race variable.    GFR, Estimated  Date Value Ref Range Status  05/05/2022 >60 >60 mL/min Final    Comment:    (NOTE) Calculated using the CKD-EPI Creatinine Equation (2021)    eGFR  Date Value Ref Range Status  06/06/2022 76 >59 mL/min/1.73 Final         Passed - Completed PHQ-2 or PHQ-9 in the last 360 days      Passed - Last BP in normal range    BP Readings from Last 1 Encounters:  09/04/22 139/78         Passed - Valid encounter within last 6 months    Recent Outpatient Visits           3 days ago Type 2 diabetes mellitus with diabetic neuropathy, without long-term current use of insulin (HCC)   [place], Megan P, DO   3 months ago Routine general medical examination at a health care facility   [facility] [place], Megan P, DO   4 months ago Appointment canceled by hospital   [facility] M[facility], Oswaldo Conroy, PA-C   4 months ago Trimalleolar fracture of right ankle, closed, with nonunion,  subsequent encounter   [facility] Mecum, Oswaldo Conroy, PA-C   5 months ago COVID-19   [facility] Mecum, Oswaldo Conroy, PA-C       Future Appointments             In 2 months Laural Benes, Oralia Rud, DO [employer], [facility]

## 2022-09-11 ENCOUNTER — Other Ambulatory Visit: Payer: Self-pay | Admitting: Podiatry

## 2022-09-11 DIAGNOSIS — M25571 Pain in right ankle and joints of right foot: Secondary | ICD-10-CM | POA: Diagnosis not present

## 2022-09-11 DIAGNOSIS — M19171 Post-traumatic osteoarthritis, right ankle and foot: Secondary | ICD-10-CM

## 2022-09-11 DIAGNOSIS — D2371 Other benign neoplasm of skin of right lower limb, including hip: Secondary | ICD-10-CM | POA: Diagnosis not present

## 2022-09-11 DIAGNOSIS — B070 Plantar wart: Secondary | ICD-10-CM | POA: Diagnosis not present

## 2022-09-11 DIAGNOSIS — E1142 Type 2 diabetes mellitus with diabetic polyneuropathy: Secondary | ICD-10-CM | POA: Diagnosis not present

## 2022-09-11 DIAGNOSIS — S82851K Displaced trimalleolar fracture of right lower leg, subsequent encounter for closed fracture with nonunion: Secondary | ICD-10-CM

## 2022-09-16 DIAGNOSIS — M6281 Muscle weakness (generalized): Secondary | ICD-10-CM | POA: Diagnosis not present

## 2022-09-19 ENCOUNTER — Ambulatory Visit
Admission: RE | Admit: 2022-09-19 | Discharge: 2022-09-19 | Disposition: A | Discharge status: Home / Self Care | Payer: BC Managed Care – PPO | Source: Ambulatory Visit | Attending: Podiatry | Admitting: Podiatry

## 2022-09-19 DIAGNOSIS — S82851K Displaced trimalleolar fracture of right lower leg, subsequent encounter for closed fracture with nonunion: Secondary | ICD-10-CM

## 2022-09-19 DIAGNOSIS — M25571 Pain in right ankle and joints of right foot: Secondary | ICD-10-CM

## 2022-09-19 DIAGNOSIS — M19171 Post-traumatic osteoarthritis, right ankle and foot: Secondary | ICD-10-CM

## 2022-09-24 DIAGNOSIS — G4733 Obstructive sleep apnea (adult) (pediatric): Secondary | ICD-10-CM | POA: Diagnosis not present

## 2022-09-27 ENCOUNTER — Ambulatory Visit: Payer: BC Managed Care – PPO | Attending: Pain Medicine | Admitting: Pain Medicine

## 2022-09-27 ENCOUNTER — Encounter: Payer: Self-pay | Admitting: Pain Medicine

## 2022-09-27 VITALS — BP 102/72 | Temp 96.8°F | Resp 16 | Ht 63.0 in | Wt 159.0 lb

## 2022-09-27 DIAGNOSIS — M79605 Pain in left leg: Secondary | ICD-10-CM | POA: Insufficient documentation

## 2022-09-27 DIAGNOSIS — M47819 Spondylosis without myelopathy or radiculopathy, site unspecified: Secondary | ICD-10-CM | POA: Diagnosis not present

## 2022-09-27 DIAGNOSIS — M5417 Radiculopathy, lumbosacral region: Secondary | ICD-10-CM | POA: Insufficient documentation

## 2022-09-27 DIAGNOSIS — M79604 Pain in right leg: Secondary | ICD-10-CM | POA: Diagnosis not present

## 2022-09-27 DIAGNOSIS — M47817 Spondylosis without myelopathy or radiculopathy, lumbosacral region: Secondary | ICD-10-CM | POA: Diagnosis not present

## 2022-09-27 DIAGNOSIS — M47816 Spondylosis without myelopathy or radiculopathy, lumbar region: Secondary | ICD-10-CM | POA: Insufficient documentation

## 2022-09-27 DIAGNOSIS — G8929 Other chronic pain: Secondary | ICD-10-CM | POA: Insufficient documentation

## 2022-09-27 DIAGNOSIS — M2540 Effusion, unspecified joint: Secondary | ICD-10-CM | POA: Insufficient documentation

## 2022-09-27 DIAGNOSIS — Z09 Encounter for follow-up examination after completed treatment for conditions other than malignant neoplasm: Secondary | ICD-10-CM | POA: Insufficient documentation

## 2022-09-27 DIAGNOSIS — M545 Low back pain, unspecified: Secondary | ICD-10-CM | POA: Insufficient documentation
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
## 2022-09-27 NOTE — Patient Instructions (Signed)

## 2022-09-27 NOTE — Progress Notes (Signed)
Safety precautions to be maintained throughout the outpatient stay will include: orient to surroundings, keep bed in low position, maintain call bell within reach at all times, provide assistance with transfer out of bed and ambulation.  

## 2022-10-09 DIAGNOSIS — M19171 Post-traumatic osteoarthritis, right ankle and foot: Secondary | ICD-10-CM | POA: Diagnosis not present

## 2022-10-09 DIAGNOSIS — M25571 Pain in right ankle and joints of right foot: Secondary | ICD-10-CM | POA: Diagnosis not present

## 2022-10-09 DIAGNOSIS — S82851K Displaced trimalleolar fracture of right lower leg, subsequent encounter for closed fracture with nonunion: Secondary | ICD-10-CM | POA: Diagnosis not present

## 2022-10-09 DIAGNOSIS — E1142 Type 2 diabetes mellitus with diabetic polyneuropathy: Secondary | ICD-10-CM | POA: Diagnosis not present

## 2022-10-10 ENCOUNTER — Other Ambulatory Visit: Payer: Self-pay | Admitting: Family Medicine

## 2022-10-10 DIAGNOSIS — M792 Neuralgia and neuritis, unspecified: Secondary | ICD-10-CM

## 2022-10-10 NOTE — Telephone Encounter (Signed)
Attempted to call patient to verify where she is filling this particular medication. Left message to call office.

## 2022-10-10 NOTE — Telephone Encounter (Signed)
Unable to refill per protocol, Rx request is too soon. Last refill 06/06/22 for 90 and 1 refill.  Requested Prescriptions  Pending Prescriptions Disp Refills   DULoxetine (CYMBALTA) 60 MG capsule [Pharmacy Med Name: DULOXETINE DR 60MG  CAPSULES] 90 capsule 1    Sig: TAKE 1 CAPSULE BY MOUTH DAILY WITH 30 MG     Psychiatry: Antidepressants - SNRI - duloxetine Passed - 10/10/2022 11:24 AM      Passed - Cr in normal range and within 360 days    Creatinine, Ser  Date Value Ref Range Status  06/06/2022 0.86 0.57 - 1.00 mg/dL Final         Passed - eGFR is 30 or above and within 360 days    GFR calc Af Amer  Date Value Ref Range Status  02/18/2020 89 >59 mL/min/1.73 Final    Comment:    **In accordance with recommendations from the NKF-ASN Task force,**   [facility] is in the process of updating its eGFR calculation to the   2021 CKD-EPI creatinine equation that estimates kidney function   without a race variable.    GFR, Estimated  Date Value Ref Range Status  05/05/2022 >60 >60 mL/min Final    Comment:    (NOTE) Calculated using the CKD-EPI Creatinine Equation (2021)    eGFR  Date Value Ref Range Status  06/06/2022 76 >59 mL/min/1.73 Final         Passed - Completed PHQ-2 or PHQ-9 in the last 360 days      Passed - Last BP in normal range    BP Readings from Last 1 Encounters:  09/27/22 102/72         Passed - Valid encounter within last 6 months    Recent Outpatient Visits           1 month ago Type 2 diabetes mellitus with diabetic neuropathy, without long-term current use of insulin (HCC)   [place] [facility] [place], Megan P, DO   4 months ago Routine general medical examination at a health care facility   [facility] [place], Megan P, DO   5 months ago Appointment canceled by hospital   [place] [facility] [place], Oswaldo Conroy, PA-C   5 months ago Trimalleolar fracture of right ankle, closed, with nonunion,  subsequent encounter   [place] [facility] [place], Oswaldo Conroy, PA-C   6 months ago COVID-19   [facility] M[place], Oswaldo Conroy, PA-C       Future Appointments             In 1 month Johnson, Oralia Rud, DO [place] [facility], [facility]

## 2022-10-11 ENCOUNTER — Other Ambulatory Visit: Payer: Self-pay | Admitting: Family Medicine

## 2022-10-11 DIAGNOSIS — M792 Neuralgia and neuritis, unspecified: Secondary | ICD-10-CM

## 2022-10-11 MED ORDER — DULOXETINE HCL 60 MG PO CPEP
60.0000 mg | ORAL_CAPSULE | Freq: Every day | ORAL | 0 refills | Status: DC
Start: 2022-10-11 — End: 2022-12-05

## 2022-10-11 NOTE — Telephone Encounter (Signed)
Refill to another pharmacy.  Requested Prescriptions  Pending Prescriptions Disp Refills   DULoxetine (CYMBALTA) 60 MG capsule 90 capsule 0    Sig: Take 1 capsule (60 mg total) by mouth daily. Home med.     Psychiatry: Antidepressants - SNRI - duloxetine Passed - 10/11/2022 12:48 PM      Passed - Cr in normal range and within 360 days    Creatinine, Ser  Date Value Ref Range Status  06/06/2022 0.86 0.57 - 1.00 mg/dL Final         Passed - eGFR is 30 or above and within 360 days    GFR calc Af Amer  Date Value Ref Range Status  02/18/2020 89 >59 mL/min/1.73 Final    Comment:    **In accordance with recommendations from the NKF-ASN Task force,**   [facility] is in the process of updating its eGFR calculation to the   2021 CKD-EPI creatinine equation that estimates kidney function   without a race variable.    GFR, Estimated  Date Value Ref Range Status  05/05/2022 >60 >60 mL/min Final    Comment:    (NOTE) Calculated using the CKD-EPI Creatinine Equation (2021)    eGFR  Date Value Ref Range Status  06/06/2022 76 >59 mL/min/1.73 Final         Passed - Completed PHQ-2 or PHQ-9 in the last 360 days      Passed - Last BP in normal range    BP Readings from Last 1 Encounters:  09/27/22 102/72         Passed - Valid encounter within last 6 months    Recent Outpatient Visits           1 month ago Type 2 diabetes mellitus with diabetic neuropathy, without long-term current use of insulin (HCC)   [facility] Dor[place], Megan P, DO   4 months ago Routine general medical examination at a health care facility   [facility] [place], Megan P, DO   5 months ago Appointment canceled by hospital   [facility] Mecum, Oswaldo Conroy, PA-C   5 months ago Trimalleolar fracture of right ankle, closed, with nonunion, subsequent encounter   [facility] Mecum, Oswaldo Conroy, PA-C   6 months ago COVID-19    [facility] Mecum, Oswaldo Conroy, PA-C       Future Appointments             In 1 month Johnson, Oralia Rud, DO [facility], [facility]

## 2022-10-11 NOTE — Telephone Encounter (Signed)
Medication Refill - Medication: DULoxetine (CYMBALTA) 60 MG capsule   Pt returning Nurse Jane's call stated please send to [facility] to pick up as she took her last capsule yesterday.  See Refill TE from 10/10/2022.  Has the patient contacted their pharmacy? No. (Agent: If no, request that the patient contact the pharmacy for the refill. If patient does not wish to contact the pharmacy document the reason why and proceed with request.)   Preferred Pharmacy (with phone number or street name):  [id number] [place] D[place], [place] - [address] AT [facility] [address] [place]  [address] [place] [address]  Phone: [phone] Fax: [phone]  Hours: Not open 24 hours   Has the patient been seen for an appointment in the last year OR does the patient have an upcoming appointment? Yes.    Agent: Please be advised that RX refills may take up to 3 business days. We ask that you follow-up with your pharmacy.

## 2022-10-12 DIAGNOSIS — G5603 Carpal tunnel syndrome, bilateral upper limbs: Secondary | ICD-10-CM | POA: Diagnosis not present

## 2022-10-12 DIAGNOSIS — G5602 Carpal tunnel syndrome, left upper limb: Secondary | ICD-10-CM | POA: Insufficient documentation

## 2022-10-15 NOTE — Progress Notes (Signed)
PROVIDER NOTE: Interpretation of information contained herein should be left to medically-trained personnel. Specific patient instructions are provided elsewhere under "Patient Instructions" section of medical record. This document was created in part using STT-dictation technology, any transcriptional errors that may result from this process are unintentional.  Patient: Lisa Galvan Type: Established DOB: [birth year] MRN: [id number] PCP: Dorcas Carrow, DO  Service: Procedure DOS: 10/16/2022 Setting: Ambulatory Location: Ambulatory outpatient facility Delivery: Face-to-face Provider: Oswaldo Done, MD Specialty: Interventional Pain Management Specialty designation: 09 Location: Outpatient facility Ref. Prov.: Delano Metz, MD       Interventional Therapy   Procedure: Lumbar Facet, Medial Branch Radiofrequency Ablation (RFA) #2  Laterality: Left (-LT)  Level: L2, L3, L4, L5, and S1 Medial Branch Level(s). These levels will denervate the L3-4, L4-5, and L5-S1 lumbar facet joints.  Imaging: Fluoroscopy-guided         Anesthesia: Local anesthesia (1-2% Lidocaine) Anxiolysis: None                 Sedation: No Sedation                       DOS: 10/16/2022  Performed by: Oswaldo Done, MD  Purpose: Therapeutic/Palliative Indications: Low back pain severe enough to impact quality of life or function. Indications: 1. Chronic low back pain (1ry area of Pain) (Bilateral) w/o sciatica   2. Lumbar facet syndrome (Bilateral)   3. Spondylosis without myelopathy or radiculopathy, lumbosacral region   4. Lumbosacral facet arthropathy   5. Lumbar Facet Hypertrophy   6. Lumbar facet effusion/edema (L4-5, L5-S1)   7. Grade 1 Anterolisthesis of L4/5   8. DDD (degenerative disc disease), lumbar    [name] has been dealing with the above chronic pain for longer than three months and has either failed to respond, was unable to tolerate, or simply did not get enough benefit from other  more conservative therapies including, but not limited to: 1. Over-the-counter medications 2. Anti-inflammatory medications 3. Muscle relaxants 4. Membrane stabilizers 5. Opioids 6. Physical therapy and/or chiropractic manipulation 7. Modalities (Heat, ice, etc.) 8. Invasive techniques such as nerve blocks. [name] has attained more than 50% relief of the pain from a series of diagnostic injections conducted in separate occasions.  Pain Score: Pre-procedure: 6 /10 Post-procedure: 0-No pain/10     Position / Prep / Materials:  Position: Prone  Prep solution: DuraPrep (Iodine Povacrylex [0.7% available iodine] and Isopropyl Alcohol, 74% w/w) Prep Area: Entire Lumbosacral Region (Lower back from mid-thoracic region to end of tailbone and from flank to flank.) Materials:  Tray: RFA (Radiofrequency) tray Needle(s):  Type: RFA (Teflon-coated radiofrequency ablation needles) Gauge (G): 22  Length: Regular (10cm) Qty: 5      Pre-op H&P Assessment:  [name] is a 62 y.o. (year old), female patient, seen today for interventional treatment. She  has a past surgical history that includes Esophagogastroduodenoscopy (egd) with propofol (N/A, 03/12/2017); Umbilical hernia repair (N/A, 05/01/2017); Hernia repair; Esophagogastroduodenoscopy (egd) with propofol (N/A, 03/30/2019); Colonoscopy with propofol (N/A, 03/30/2019); ORIF ankle fracture (Left, 07/30/2019); XI robotic assisted ventral hernia (N/A, 10/06/2019); Incisional hernia repair (N/A, 11/29/2020); Supracervical abdominal hysterectomy; Abdominal hysterectomy; Ankle Closed Reduction (Right, 04/15/2022); and ORIF ankle fracture (Right, 05/03/2022). [name] has a current medication list which includes the following prescription(s): accu-chek softclix lancets, aspirin ec, atorvastatin, vitamin d-3, duloxetine, epinephrine, fexofenadine, gabapentin, hydrocodone-acetaminophen, [START ON 10/23/2022] hydrocodone-acetaminophen, magnesium gluconate,  metformin, metoprolol succinate, omeprazole, ozempic (0.25 or 0.5 mg/dose), sucralfate, cyanocobalamin, and  enoxaparin, and the following Facility-Administered Medications: pentafluoroprop-tetrafluoroeth. Her primarily concern today is the Back Pain (lower)  Initial Vital Signs:  Pulse/HCG Rate: (!) 102ECG Heart Rate: 94 Temp: (!) 97.2 F (36.2 C) Resp: 20 BP: 120/82 SpO2: 99 %  BMI: Estimated body mass index is 28.17 kg/m as calculated from the following:   Height as of this encounter: 5\' 3"  (1.6 m).   Weight as of this encounter: 159 lb (72.1 kg).  Risk Assessment: Allergies: Reviewed. She is allergic to benazepril, ivp dye [iodinated contrast media], lidoderm [lidocaine], lyrica [pregabalin], shellfish allergy, shellfish-derived products, trulicity [dulaglutide], and ace inhibitors.  Allergy Precautions: None required Coagulopathies: Reviewed. None identified.  Blood-thinner therapy: None at this time Active Infection(s): Reviewed. None identified. [name] is afebrile  Site Confirmation: [name] was asked to confirm the procedure and laterality before marking the site Procedure checklist: Completed Consent: Before the procedure and under the influence of no sedative(s), amnesic(s), or anxiolytics, the patient was informed of the treatment options, risks and possible complications. To fulfill our ethical and legal obligations, as recommended by the American Medical Association's Code of Ethics, I have informed the patient of my clinical impression; the nature and purpose of the treatment or procedure; the risks, benefits, and possible complications of the intervention; the alternatives, including doing nothing; the risk(s) and benefit(s) of the alternative treatment(s) or procedure(s); and the risk(s) and benefit(s) of doing nothing. The patient was provided information about the general risks and possible complications associated with the procedure. These may include, but are not  limited to: failure to achieve desired goals, infection, bleeding, organ or nerve damage, allergic reactions, paralysis, and death. In addition, the patient was informed of those risks and complications associated to Spine-related procedures, such as failure to decrease pain; infection (i.e.: Meningitis, epidural or intraspinal abscess); bleeding (i.e.: epidural hematoma, subarachnoid hemorrhage, or any other type of intraspinal or peri-dural bleeding); organ or nerve damage (i.e.: Any type of peripheral nerve, nerve root, or spinal cord injury) with subsequent damage to sensory, motor, and/or autonomic systems, resulting in permanent pain, numbness, and/or weakness of one or several areas of the body; allergic reactions; (i.e.: anaphylactic reaction); and/or death. Furthermore, the patient was informed of those risks and complications associated with the medications. These include, but are not limited to: allergic reactions (i.e.: anaphylactic or anaphylactoid reaction(s)); adrenal axis suppression; blood sugar elevation that in diabetics may result in ketoacidosis or comma; water retention that in patients with history of congestive heart failure may result in shortness of breath, pulmonary edema, and decompensation with resultant heart failure; weight gain; swelling or edema; medication-induced neural toxicity; particulate matter embolism and blood vessel occlusion with resultant organ, and/or nervous system infarction; and/or aseptic necrosis of one or more joints. Finally, the patient was informed that Medicine is not an exact science; therefore, there is also the possibility of unforeseen or unpredictable risks and/or possible complications that may result in a catastrophic outcome. The patient indicated having understood very clearly. We have given the patient no guarantees and we have made no promises. Enough time was given to the patient to ask questions, all of which were answered to the patient's  satisfaction. Ms. Rentz has indicated that she wanted to continue with the procedure. Attestation: I, the ordering provider, attest that I have discussed with the patient the benefits, risks, side-effects, alternatives, likelihood of achieving goals, and potential problems during recovery for the procedure that I have provided informed consent. Date  Time: 10/16/2022  8:57 AM  Pre-Procedure Preparation:  Monitoring: As per clinic protocol. Respiration, ETCO2, SpO2, BP, heart rate and rhythm monitor placed and checked for adequate function Safety Precautions: Patient was assessed for positional comfort and pressure points before starting the procedure. Time-out: I initiated and conducted the "Time-out" before starting the procedure, as per protocol. The patient was asked to participate by confirming the accuracy of the "Time Out" information. Verification of the correct person, site, and procedure were performed and confirmed by me, the nursing staff, and the patient. "Time-out" conducted as per Joint Commission's Universal Protocol (UP.01.01.01). Time: 0939 Start Time: 0939 hrs.  Description of Procedure:          Laterality: See above. Levels:  See above. Safety Precautions: Aspiration looking for blood return was conducted prior to all injections. At no point did we inject any substances, as a needle was being advanced. Before injecting, the patient was told to immediately notify me if she was experiencing any new onset of "ringing in the ears, or metallic taste in the mouth". No attempts were made at seeking any paresthesias. Safe injection practices and needle disposal techniques used. Medications properly checked for expiration dates. SDV (single dose vial) medications used. After the completion of the procedure, all disposable equipment used was discarded in the proper designated medical waste containers. Local Anesthesia: Protocol guidelines were followed. The patient was positioned over the  fluoroscopy table. The area was prepped in the usual manner. The time-out was completed. The target area was identified using fluoroscopy. A 12-in long, straight, sterile hemostat was used with fluoroscopic guidance to locate the targets for each level blocked. Once located, the skin was marked with an approved surgical skin marker. Once all sites were marked, the skin (epidermis, dermis, and hypodermis), as well as deeper tissues (fat, connective tissue and muscle) were infiltrated with a small amount of a short-acting local anesthetic, loaded on a 10cc syringe with a 25G, 1.5-in  Needle. An appropriate amount of time was allowed for local anesthetics to take effect before proceeding to the next step. Technical description of process:  Radiofrequency Ablation (RFA) L2 Medial Branch Nerve RFA: The target area for the L2 medial branch is at the junction of the postero-lateral aspect of the superior articular process and the superior, posterior, and medial edge of the transverse process of L3. Under fluoroscopic guidance, a Radiofrequency needle was inserted until contact was made with os over the superior postero-lateral aspect of the pedicular shadow (target area). Sensory and motor testing was conducted to properly adjust the position of the needle. Once satisfactory placement of the needle was achieved, the numbing solution was slowly injected after negative aspiration for blood. 2.0 mL of the nerve block solution was injected without difficulty or complication. After waiting for at least 3 minutes, the ablation was performed. Once completed, the needle was removed intact. L3 Medial Branch Nerve RFA: The target area for the L3 medial branch is at the junction of the postero-lateral aspect of the superior articular process and the superior, posterior, and medial edge of the transverse process of L4. Under fluoroscopic guidance, a Radiofrequency needle was inserted until contact was made with os over the  superior postero-lateral aspect of the pedicular shadow (target area). Sensory and motor testing was conducted to properly adjust the position of the needle. Once satisfactory placement of the needle was achieved, the numbing solution was slowly injected after negative aspiration for blood. 2.0 mL of the nerve block solution was injected without difficulty or complication. After waiting  for at least 3 minutes, the ablation was performed. Once completed, the needle was removed intact. L4 Medial Branch Nerve RFA: The target area for the L4 medial branch is at the junction of the postero-lateral aspect of the superior articular process and the superior, posterior, and medial edge of the transverse process of L5. Under fluoroscopic guidance, a Radiofrequency needle was inserted until contact was made with os over the superior postero-lateral aspect of the pedicular shadow (target area). Sensory and motor testing was conducted to properly adjust the position of the needle. Once satisfactory placement of the needle was achieved, the numbing solution was slowly injected after negative aspiration for blood. 2.0 mL of the nerve block solution was injected without difficulty or complication. After waiting for at least 3 minutes, the ablation was performed. Once completed, the needle was removed intact. L5 Medial Branch Nerve RFA: The target area for the L5 medial branch is at the junction of the postero-lateral aspect of the superior articular process of S1 and the superior, posterior, and medial edge of the sacral ala. Under fluoroscopic guidance, a Radiofrequency needle was inserted until contact was made with os over the superior postero-lateral aspect of the pedicular shadow (target area). Sensory and motor testing was conducted to properly adjust the position of the needle. Once satisfactory placement of the needle was achieved, the numbing solution was slowly injected after negative aspiration for blood. 2.0 mL of the  nerve block solution was injected without difficulty or complication. After waiting for at least 3 minutes, the ablation was performed. Once completed, the needle was removed intact. S1 Medial Branch Nerve RFA: The target area for the S1 medial branch is located inferior to the junction of the S1 superior articular process and the L5 inferior articular process, posterior, inferior, and lateral to the 6 o'clock position of the L5-S1 facet joint, just superior to the S1 posterior foramen. Under fluoroscopic guidance, the Radiofrequency needle was advanced until contact was made with os over the Target area. Sensory and motor testing was conducted to properly adjust the position of the needle. Once satisfactory placement of the needle was achieved, the numbing solution was slowly injected after negative aspiration for blood. 2.0 mL of the nerve block solution was injected without difficulty or complication. After waiting for at least 3 minutes, the ablation was performed. Once completed, the needle was removed intact. Radiofrequency lesioning (ablation):  Radiofrequency Generator: Medtronic AccurianTM AG 1000 RF Generator Sensory Stimulation Parameters: 50 Hz was used to locate & identify the nerve, making sure that the needle was positioned such that there was no sensory stimulation below 0.3 V or above 0.7 V. Motor Stimulation Parameters: 2 Hz was used to evaluate the motor component. Care was taken not to lesion any nerves that demonstrated motor stimulation of the lower extremities at an output of less than 2.5 times that of the sensory threshold, or a maximum of 2.0 V. Lesioning Technique Parameters: Standard Radiofrequency settings. (Not bipolar or pulsed.) Temperature Settings: 80 degrees C Lesioning time: 60 seconds Intra-operative Compliance: Compliant  Once the entire procedure was completed, the treated area was cleaned, making sure to leave some of the prepping solution back to take advantage of  its long term bactericidal properties.    Illustration of the posterior view of the lumbar spine and the posterior neural structures. Laminae of L2 through S1 are labeled. DPRL5, dorsal primary ramus of L5; DPRS1, dorsal primary ramus of S1; DPR3, dorsal primary ramus of L3; FJ, facet (zygapophyseal) joint  L3-L4; I, inferior articular process of L4; LB1, lateral branch of dorsal primary ramus of L1; IAB, inferior articular branches from L3 medial branch (supplies L4-L5 facet joint); IBP, intermediate branch plexus; MB3, medial branch of dorsal primary ramus of L3; NR3, third lumbar nerve root; S, superior articular process of L5; SAB, superior articular branches from L4 (supplies L4-5 facet joint also); TP3, transverse process of L3.  Facet Joint Innervation (* possible contribution)  L1-2 T12, L1 (L2*)  Medial Branch  L2-3 L1, L2 (L3*)         "          "  L3-4 L2, L3 (L4*)         "          "  L4-5 L3, L4 (L5*)         "          "  L5-S1 L4, L5, S1          "          "    Vitals:   10/16/22 0953 10/16/22 0958 10/16/22 1003 10/16/22 1006  BP: (!) 129/99 (!) 130/102 (!) 133/100 (!) 146/102  Pulse:      Resp: (!) 23 (!) 22 (!) 21 (!) 21  Temp:      SpO2: 99% 99% 97% 98%  Weight:      Height:        Start Time: 0939 hrs. End Time: 1005 hrs.  Imaging Guidance (Spinal):          Type of Imaging Technique: Fluoroscopy Guidance (Spinal) Indication(s): Assistance in needle guidance and placement for procedures requiring needle placement in or near specific anatomical locations not easily accessible without such assistance. Exposure Time: Please see nurses notes. Contrast: None used. Fluoroscopic Guidance: I was personally present during the use of fluoroscopy. "Tunnel Vision Technique" used to obtain the best possible view of the target area. Parallax error corrected before commencing the procedure. "Direction-depth-direction" technique used to introduce the needle under continuous pulsed  fluoroscopy. Once target was reached, antero-posterior, oblique, and lateral fluoroscopic projection used confirm needle placement in all planes. Images permanently stored in EMR. Interpretation: No contrast injected. I personally interpreted the imaging intraoperatively. Adequate needle placement confirmed in multiple planes. Permanent images saved into the patient's record.  Antibiotic Prophylaxis:   Anti-infectives (From admission, onward)    None      Indication(s): None identified  Post-operative Assessment:  Post-procedure Vital Signs:  Pulse/HCG Rate: (!) 10294 Temp: (!) 97.2 F (36.2 C) Resp: (!) 21 BP: (!) 146/102 (patient reports that she did not take her BP medication this morning.) SpO2: 98 %  EBL: None  Complications: No immediate post-treatment complications observed by team, or reported by patient.  Note: The patient tolerated the entire procedure well. A repeat set of vitals were taken after the procedure and the patient was kept under observation following institutional policy, for this type of procedure. Post-procedural neurological assessment was performed, showing return to baseline, prior to discharge. The patient was provided with post-procedure discharge instructions, including a section on how to identify potential problems. Should any problems arise concerning this procedure, the patient was given instructions to immediately contact us, at any time, without hesitation. In any case, we plan to contact the patient by telephone for a follow-up status report regarding this interventional procedure.  Comments:  No additional relevant information.  Plan of Care (POC)  Orders:  Orders Placed This Encounter  Procedures  Radiofrequency,Lumbar    Scheduling Instructions:     Side(s): Left-sided     Level: L2-3, L3-4, L4-5, and L5-S1 Facets (L2, L3, L4, L5, and S1 Medial Branch)     Sedation: With Sedation.     Timeframe: Today    Order Specific Question:    Where will this procedure be performed?    Answer:   [facility]   Radiofrequency,Lumbar    Standing Status:   Future    Standing Expiration Date:   01/16/2023    Scheduling Instructions:     Side(s): Right-sided     Level: L3-4, L4-5, and L5-S1 Facets (L2, L3, L4, L5, and S1 Medial Branch)     Sedation: No Sedation.     Scheduling Timeframe: 2 weeks from now    Order Specific Question:   Where will this procedure be performed?    Answer:   [facility]   DG PAIN CLINIC C-ARM 1-60 MIN NO REPORT    Intraoperative interpretation by procedural physician at [facility].    Standing Status:   Standing    Number of Occurrences:   1    Order Specific Question:   Reason for exam:    Answer:   Assistance in needle guidance and placement for procedures requiring needle placement in or near specific anatomical locations not easily accessible without such assistance.   Informed Consent Details: Physician/Practitioner Attestation; Transcribe to consent form and obtain patient signature    Nursing Order: Transcribe to consent form and obtain patient signature. Note: Always confirm laterality of pain with Ms. Richardson Dopp, before procedure.    Order Specific Question:   Physician/Practitioner attestation of informed consent for procedure/surgical case    Answer:   I, the physician/practitioner, attest that I have discussed with the patient the benefits, risks, side effects, alternatives, likelihood of achieving goals and potential problems during recovery for the procedure that I have provided informed consent.    Order Specific Question:   Procedure    Answer:   Lumbar Facet Radiofrequency Ablation    Order Specific Question:   Physician/Practitioner performing the procedure    Answer:   Hillman Attig A. Laban Emperor, MD    Order Specific Question:   Indication/Reason    Answer:   Low Back Pain, with our without leg pain, due to Facet Joint Arthralgia (Joint Pain) known as Lumbar Facet  Syndrome, secondary to Lumbar, and/or Lumbosacral Spondylosis (Arthritis of the Spine), without myelopathy or radiculopathy (Nerve Damage).   Provide equipment / supplies at bedside    Procedure tray: "Radiofrequency Tray" Additional material: Large hemostat (x1); Small hemostat (x1); Towels (x8); 4x4 sterile sponge pack (x1) Needle type: Teflon-coated Radiofrequency Needle (Disposable  single use) Size: Regular Quantity: 5    Standing Status:   Standing    Number of Occurrences:   1    Order Specific Question:   Specify    Answer:   Radiofrequency Tray   Chronic Opioid Analgesic:  No opioid analgesics prescribed by our practice.   Medications ordered for procedure: Meds ordered this encounter  Medications   lidocaine (XYLOCAINE) 2 % (with pres) injection 400 mg   pentafluoroprop-tetrafluoroeth (GEBAUERS) aerosol   ropivacaine (PF) 2 mg/mL (0.2%) (NAROPIN) injection 9 mL   triamcinolone acetonide (KENALOG-40) injection 40 mg   HYDROcodone-acetaminophen (NORCO/VICODIN) 5-325 MG tablet    Sig: Take 1 tablet by mouth every 8 (eight) hours as needed for up to 7 days for severe pain. Must last 7 days.    Dispense:  21 tablet    Refill:  0    For acute post-operative pain. Not to be refilled. Must last 7 days.   HYDROcodone-acetaminophen (NORCO/VICODIN) 5-325 MG tablet    Sig: Take 1 tablet by mouth every 8 (eight) hours as needed for up to 7 days for severe pain. Must last for 7 days.    Dispense:  21 tablet    Refill:  0    For acute post-operative pain. Not to be refilled. Must last 7 days.   Medications administered: We administered lidocaine, ropivacaine (PF) 2 mg/mL (0.2%), and triamcinolone acetonide.  See the medical record for exact dosing, route, and time of administration.  Follow-up plan:   Return in about 2 weeks (around 10/30/2022) for [facility]): (R) L-FCT RFA #2 (NO Sedation).       Interventional Therapies  Risk  Complexity Considerations:   Estimated body mass  index is 26.57 kg/m as calculated from the following:   Height as of 01/31/21: 5\' 3"  (1.6 m).   Weight as of 01/31/21: 150 lb (68 kg). NOTE: IVP DYE ALLERGY - ANAPHYLAXIS IODINE ALLERGY - ANAPHYLAXIS SHELLFISH ALLERGY - ANAPHYLAXIS   Planned  Pending:   Therapeutic bilateral lumbar facet RFA #2, starting with the left side.   Under consideration:   Therapeutic bilateral lumbar facet RFA #2  Therapeutic left L4-5 LESI #3 (+ bilateral L5 TFESI #3 - Denied by [employer]) Therapeutic caudal ESI #3  Therapeutic Qutenza treatment of her diabetic peripheral neuropathy.   Completed:   Diagnostic/therapeutic right CESI x1 (08/03/2021) (100/100/80/80)  Palliative right lumbar facet RFA x1 (06/02/2019) (100/100/95)  Palliative left lumbar facet RFA x1 (06/30/2019) (100/100/95-100)  Palliative bilateral lumbar facet MBB x2 (04/21/2019) (100/100/40/40)  Palliative right sacroiliac joint Blk x1 (04/21/2019) (100/100/60/40)  Diagnostic caudal (Midline to right) ESI x2 (07/04/2021) (75/75/75/75)  Diagnostic/therapeutic bilateral L5 TFESI x2 (10/31/2021) (LBP:100/100/100/100) (LEP:100/100/80/80)  Diagnostic/therapeutic left L4-5 LESI x3 (02/13/2022) (LBP:100/100/100/100) (LEP:100/100/80/80)    Completed by other providers:   Diagnostic/therapeutic right L4-5 LESI x1 (12/29/2014) by Dr. Ewing Schlein  CT injections of wrist (?)  EMG/PNCV (LE) (03/20/2017) Dx: (R) sural sensory peripheral neuropathy  EMG/PNCV (LE) (03/02/2019) Dx: generalized sensory polyneuropathy, no lumbar radiculopathy  EMG/PNCV (UE) (07/25/2021) Dx: generalized sensory polyneuropathy, superimposed (B) carpal tunnel syndrome    Therapeutic  Palliative (PRN) options:   Therapeutic L4-5 LESI + L5 TFESI  Palliative lumbar facet RFA  Palliative lumbar facet MBB  Palliative sacroiliac joint Blk  Diagnostic caudal ESI    Pharmacotherapy  Nonopioids transferred 02/01/2020: Lyrica, magnesium         Recent Visits Date Type Provider  Dept  09/27/22 Office Visit Delano Metz, MD [facility]  08/23/22 Procedure visit Delano Metz, MD [facility]  08/20/22 Office Visit Delano Metz, MD [facility]  Showing recent visits within past 90 days and meeting all other requirements Today's Visits Date Type Provider Dept  10/16/22 Procedure visit Delano Metz, MD [facility]  Showing today's visits and meeting all other requirements Future Appointments Date Type Provider Dept  10/30/22 Appointment Delano Metz, MD [facility]  Showing future appointments within next 90 days and meeting all other requirements  Disposition: Discharge home  Discharge (Date  Time): 10/16/2022; 0945 hrs.   Primary Care Physician: Dorcas Carrow, DO Location: [facility] Note by: Oswaldo Done, MD (TTS technology used. I apologize for any typographical errors that were not detected and corrected.) Date: 10/16/2022; Time: 11:03 AM  Disclaimer:  Medicine is not an Visual merchandiser. The only guarantee in medicine is that nothing is guaranteed. It is important to note that the decision to proceed with this intervention was based on the information collected from the patient. The Data and conclusions were drawn from the patient's questionnaire, the interview, and the physical examination. Because the information was provided in large part by the patient, it cannot be guaranteed that it has not been purposely or unconsciously manipulated. Every effort has been made to obtain as much relevant data as possible for this evaluation. It is important to note that the conclusions that lead to this procedure are derived in large part from the available data. Always take into account that the treatment will also be dependent on availability of resources and existing treatment guidelines, considered by other Pain Management Practitioners as being common  knowledge and practice, at the time of the intervention. For Medico-Legal purposes, it is also important to point out that variation in procedural techniques and pharmacological choices are the acceptable norm. The indications, contraindications, technique, and results of the above procedure should only be interpreted and judged by a Board-Certified Interventional Pain Specialist with extensive familiarity and expertise in the same exact procedure and technique.

## 2022-10-16 ENCOUNTER — Encounter: Payer: Self-pay | Admitting: Pain Medicine

## 2022-10-16 ENCOUNTER — Ambulatory Visit
Admission: RE | Admit: 2022-10-16 | Discharge: 2022-10-16 | Disposition: A | Discharge status: Home / Self Care | Payer: BC Managed Care – PPO | Source: Ambulatory Visit | Attending: Pain Medicine | Admitting: Pain Medicine

## 2022-10-16 ENCOUNTER — Ambulatory Visit: Payer: BC Managed Care – PPO | Attending: Pain Medicine | Admitting: Pain Medicine

## 2022-10-16 VITALS — BP 146/102 | HR 102 | Temp 97.2°F | Resp 21 | Ht 63.0 in | Wt 159.0 lb

## 2022-10-16 DIAGNOSIS — M47819 Spondylosis without myelopathy or radiculopathy, site unspecified: Secondary | ICD-10-CM | POA: Diagnosis not present

## 2022-10-16 DIAGNOSIS — M545 Low back pain, unspecified: Secondary | ICD-10-CM | POA: Insufficient documentation

## 2022-10-16 DIAGNOSIS — M4310 Spondylolisthesis, site unspecified: Secondary | ICD-10-CM | POA: Diagnosis not present

## 2022-10-16 DIAGNOSIS — M47816 Spondylosis without myelopathy or radiculopathy, lumbar region: Secondary | ICD-10-CM | POA: Insufficient documentation

## 2022-10-16 DIAGNOSIS — M47817 Spondylosis without myelopathy or radiculopathy, lumbosacral region: Secondary | ICD-10-CM | POA: Insufficient documentation

## 2022-10-16 DIAGNOSIS — G8918 Other acute postprocedural pain: Secondary | ICD-10-CM | POA: Insufficient documentation

## 2022-10-16 DIAGNOSIS — M2540 Effusion, unspecified joint: Secondary | ICD-10-CM | POA: Insufficient documentation

## 2022-10-16 DIAGNOSIS — G8929 Other chronic pain: Secondary | ICD-10-CM | POA: Insufficient documentation

## 2022-10-16 DIAGNOSIS — M5136 Other intervertebral disc degeneration, lumbar region: Secondary | ICD-10-CM | POA: Diagnosis not present

## 2022-10-16 MED ORDER — LIDOCAINE HCL 2 % IJ SOLN
20.0000 mL | Freq: Once | INTRAMUSCULAR | Status: AC
  Administered 2022-10-16: 400 mg

## 2022-10-16 MED ORDER — ROPIVACAINE HCL 2 MG/ML IJ SOLN
INTRAMUSCULAR | Status: AC
  Filled 2022-10-16: qty 20

## 2022-10-16 MED ORDER — HYDROCODONE-ACETAMINOPHEN 5-325 MG PO TABS
1.00 | ORAL_TABLET | Freq: Three times a day (TID) | ORAL | 0 refills | Status: DC | PRN
Start: 2022-10-23 — End: 2022-10-30

## 2022-10-16 MED ORDER — TRIAMCINOLONE ACETONIDE 40 MG/ML IJ SUSP
40.0000 mg | Freq: Once | INTRAMUSCULAR | Status: AC
  Administered 2022-10-16: 40 mg

## 2022-10-16 MED ORDER — PENTAFLUOROPROP-TETRAFLUOROETH EX AERO
INHALATION_SPRAY | Freq: Once | CUTANEOUS | Status: DC
  Filled 2022-10-16: qty 116

## 2022-10-16 MED ORDER — LIDOCAINE HCL (PF) 2 % IJ SOLN
INTRAMUSCULAR | Status: AC
  Filled 2022-10-16: qty 10

## 2022-10-16 MED ORDER — ROPIVACAINE HCL 2 MG/ML IJ SOLN
9.0000 mL | Freq: Once | INTRAMUSCULAR | Status: AC
  Administered 2022-10-16: 9 mL via PERINEURAL

## 2022-10-16 MED ORDER — TRIAMCINOLONE ACETONIDE 40 MG/ML IJ SUSP
INTRAMUSCULAR | Status: AC
  Filled 2022-10-16: qty 1

## 2022-10-16 MED ORDER — HYDROCODONE-ACETAMINOPHEN 5-325 MG PO TABS
1.0000 | ORAL_TABLET | Freq: Three times a day (TID) | ORAL | 0 refills | Status: AC | PRN
Start: 2022-10-16 — End: 2022-10-23

## 2022-10-16 NOTE — Progress Notes (Signed)
Safety precautions to be maintained throughout the outpatient stay will include: orient to surroundings, keep bed in low position, maintain call bell within reach at all times, provide assistance with transfer out of bed and ambulation.  

## 2022-10-16 NOTE — Patient Instructions (Addendum)
___________________________________________________________________________________________  Post-Radiofrequency (RF) Discharge Instructions  You have just completed a Radiofrequency Neurotomy.  The following instructions will provide you with information and guidelines for self-care upon discharge.  If at any time you have questions or concerns please call your physician. DO NOT DRIVE YOURSELF!!  Instructions: Apply ice: Fill a plastic sandwich bag with crushed ice. Cover it with a small towel and apply to injection site. Apply for 15 minutes then remove x 15 minutes. Repeat sequence on day of procedure, until you go to bed. The purpose is to minimize swelling and discomfort after procedure. Apply heat: Apply heat to procedure site starting the day following the procedure. The purpose is to treat any soreness and discomfort from the procedure. Food intake: No eating limitations, unless stipulated above.  Nevertheless, if you have had sedation, you may experience some nausea.  In this case, it may be wise to wait at least two hours prior to resuming regular diet. Physical activities: Keep activities to a minimum for the first 8 hours after the procedure. For the first 24 hours after the procedure, do not drive a motor vehicle,  Operate heavy machinery, power tools, or handle any weapons.  Consider walking with the use of an assistive device or accompanied by an adult for the first 24 hours.  Do not drink alcoholic beverages including beer.  Do not make any important decisions or sign any legal documents. Go home and rest today.  Resume activities tomorrow, as tolerated.  Use caution in moving about as you may experience mild leg weakness.  Use caution in cooking, use of household electrical appliances and climbing steps. Driving: If you have received any sedation, you are not allowed to drive for 24 hours after your procedure. Blood thinner: Restart your blood thinner 6 hours after your procedure.  (Only for those taking blood thinners) Insulin: As soon as you can eat, you may resume your normal dosing schedule. (Only for those taking insulin) Medications: May resume pre-procedure medications.  Do not take any drugs, other than what has been prescribed to you. Infection prevention: Keep procedure site clean and dry. Post-procedure Pain Diary: Extremely important that this be done correctly and accurately. Recorded information will be used to determine the next step in treatment. Pain evaluated is that of treated area only. Do not include pain from an untreated area. Complete every hour, on the hour, for the initial 8 hours. Set an alarm to help you do this part accurately. Do not go to sleep and have it completed later. It will not be accurate. Follow-up appointment: Keep your follow-up appointment after the procedure. Usually 2-6 weeks after radiofrequency. Bring you pain diary. The information collected will be essential for your long-term care.   Expect: From numbing medicine (AKA: Local Anesthetics): Numbness or decrease in pain. Onset: Full effect within 15 minutes of injected. Duration: It will depend on the type of local anesthetic used. On the average, 1 to 8 hours.  From steroids (when added): Decrease in swelling or inflammation. Once inflammation is improved, relief of the pain will follow. Onset of benefits: Depends on the amount of swelling present. The more swelling, the longer it will take for the benefits to be seen. In some cases, up to 10 days. Duration: Steroids will stay in the system x 2 weeks. Duration of benefits will depend on multiple posibilities including persistent irritating factors. From procedure: Some discomfort is to be expected once the numbing medicine wears off. In the case of radiofrequency procedures,   this may last as long as 6 weeks. Additional post-procedure pain medication is provided for this. Discomfort is minimized if ice and heat are applied as  instructed.  Call if: You experience numbness and weakness that gets worse with time, as opposed to wearing off. He experience any unusual bleeding, difficulty breathing, or loss of the ability to control your bowel and bladder. (This applies to Spinal procedures only) You experience any redness, swelling, heat, red streaks, elevated temperature, fever, or any other signs of a possible infection.  Emergency Numbers: Durning business hours (Monday - Thursday, 8:00 AM - 4:00 PM) (Friday, 9:00 AM - 12:00 Noon): [phone] After hours: [phone] ____________________________________________________________________________________________   ______________________________________________________________________  Procedure instructions  Do not eat or drink fluids (other than water) for 6 hours before your procedure  No water for 2 hours before your procedure  Take your blood pressure medicine with a sip of water  Arrive 30 minutes before your appointment  Carefully read the "Preparing for your procedure" detailed instructions  If you have questions call us at [phone]  _____________________________________________________________________    ______________________________________________________________________  Preparing for your procedure  Appointments: If you think you may not be able to keep your appointment, call 24-48 hours in advance to cancel. We need time to make it available to others.  During your procedure appointment there will be: No Prescription Refills. No disability issues to discussed. No medication changes or discussions.  Instructions: Food intake: Avoid eating anything solid for at least 8 hours prior to your procedure. Clear liquid intake: You may take clear liquids such as water up to 2 hours prior to your procedure. (No carbonated drinks. No soda.) Transportation: Unless otherwise stated by your physician, bring a driver. Morning  Medicines: Except for blood thinners, take all of your other morning medications with a sip of water. Make sure to take your heart and blood pressure medicines. If your blood pressure's lower number is above 100, the case will be rescheduled. Blood thinners: Make sure to stop your blood thinners as instructed.  If you take a blood thinner, but were not instructed to stop it, call our office [phone] and ask to talk to a nurse. Not stopping a blood thinner prior to certain procedures could lead to serious complications. Diabetics on insulin: Notify the staff so that you can be scheduled 1st case in the morning. If your diabetes requires high dose insulin, take only  of your normal insulin dose the morning of the procedure and notify the staff that you have done so. Preventing infections: Shower with an antibacterial soap the morning of your procedure.  Build-up your immune system: Take 1000 mg of Vitamin C with every meal (3 times a day) the day prior to your procedure. Antibiotics: Inform the nursing staff if you are taking any antibiotics or if you have any conditions that may require antibiotics prior to procedures. (Example: recent joint implants)   Pregnancy: If you are pregnant make sure to notify the nursing staff. Not doing so may result in injury to the fetus, including death.  Sickness: If you have a cold, fever, or any active infections, call and cancel or reschedule your procedure. Receiving steroids while having an infection may result in complications. Arrival: You must be in the facility at least 30 minutes prior to your scheduled procedure. Tardiness: Your scheduled time is also the cutoff time. If you do not arrive at least 15 minutes prior to your procedure, you will be rescheduled.    Children: Do not bring any children with you. Make arrangements to keep them home. Dress appropriately: There is always a possibility that your clothing may get soiled. Avoid long dresses. Valuables:  Do not bring any jewelry or valuables.  Reasons to call and reschedule or cancel your procedure: (Following these recommendations will minimize the risk of a serious complication.) Surgeries: Avoid having procedures within 2 weeks of any surgery. (Avoid for 2 weeks before or after any surgery). Flu Shots: Avoid having procedures within 2 weeks of a flu shots or . (Avoid for 2 weeks before or after immunizations). Barium: Avoid having a procedure within 7-10 days after having had a radiological study involving the use of radiological contrast. (Myelograms, Barium swallow or enema study). Heart attacks: Avoid any elective procedures or surgeries for the initial 6 months after a "Myocardial Infarction" (Heart Attack). Blood thinners: It is imperative that you stop these medications before procedures. Let us know if you if you take any blood thinner.  Infection: Avoid procedures during or within two weeks of an infection (including chest colds or gastrointestinal problems). Symptoms associated with infections include: Localized redness, fever, chills, night sweats or profuse sweating, burning sensation when voiding, cough, congestion, stuffiness, runny nose, sore throat, diarrhea, nausea, vomiting, cold or Flu symptoms, recent or current infections. It is specially important if the infection is over the area that we intend to treat. Heart and lung problems: Symptoms that may suggest an active cardiopulmonary problem include: cough, chest pain, breathing difficulties or shortness of breath, dizziness, ankle swelling, uncontrolled high or unusually low blood pressure, and/or palpitations. If you are experiencing any of these symptoms, cancel your procedure and contact your primary care physician for an evaluation.  Remember:  Regular Business hours are:  Monday to Thursday 8:00 AM to 4:00 PM  Provider's Schedule: Destan Franchini, MD:  Procedure days: Tuesday and Thursday 7:30 AM to 4:00 PM  Bilal  Lateef, MD:  Procedure days: Monday and Wednesday 7:30 AM to 4:00 PM  ______________________________________________________________________    ____________________________________________________________________________________________  General Risks and Possible Complications  Patient Responsibilities: It is important that you read this as it is part of your informed consent. It is our duty to inform you of the risks and possible complications associated with treatments offered to you. It is your responsibility as a patient to read this and to ask questions about anything that is not clear or that you believe was not covered in this document.  Patient's Rights: You have the right to refuse treatment. You also have the right to change your mind, even after initially having agreed to have the treatment done. However, under this last option, if you wait until the last second to change your mind, you may be charged for the materials used up to that point.  Introduction: Medicine is not an exact science. Everything in Medicine, including the lack of treatment(s), carries the potential for danger, harm, or loss (which is by definition: Risk). In Medicine, a complication is a secondary problem, condition, or disease that can aggravate an already existing one. All treatments carry the risk of possible complications. The fact that a side effects or complications occurs, does not imply that the treatment was conducted incorrectly. It must be clearly understood that these can happen even when everything is done following the highest safety standards.  No treatment: You can choose not to proceed with the proposed treatment alternative. The "PRO(s)" would include: avoiding the risk of complications associated with the therapy. The "CON(s)" would   include: not getting any of the treatment benefits. These benefits fall under one of three categories: diagnostic; therapeutic; and/or palliative. Diagnostic benefits  include: getting information which can ultimately lead to improvement of the disease or symptom(s). Therapeutic benefits are those associated with the successful treatment of the disease. Finally, palliative benefits are those related to the decrease of the primary symptoms, without necessarily curing the condition (example: decreasing the pain from a flare-up of a chronic condition, such as incurable terminal cancer).  General Risks and Complications: These are associated to most interventional treatments. They can occur alone, or in combination. They fall under one of the following six (6) categories: no benefit or worsening of symptoms; bleeding; infection; nerve damage; allergic reactions; and/or death. No benefits or worsening of symptoms: In Medicine there are no guarantees, only probabilities. No healthcare provider can ever guarantee that a medical treatment will work, they can only state the probability that it may. Furthermore, there is always the possibility that the condition may worsen, either directly, or indirectly, as a consequence of the treatment. Bleeding: This is more common if the patient is taking a blood thinner, either prescription or over the counter (example: Goody Powders, Fish oil, Aspirin, Garlic, etc.), or if suffering a condition associated with impaired coagulation (example: Hemophilia, cirrhosis of the liver, low platelet counts, etc.). However, even if you do not have one on these, it can still happen. If you have any of these conditions, or take one of these drugs, make sure to notify your treating physician. Infection: This is more common in patients with a compromised immune system, either due to disease (example: diabetes, cancer, human immunodeficiency virus [HIV], etc.), or due to medications or treatments (example: therapies used to treat cancer and rheumatological diseases). However, even if you do not have one on these, it can still happen. If you have any of these  conditions, or take one of these drugs, make sure to notify your treating physician. Nerve Damage: This is more common when the treatment is an invasive one, but it can also happen with the use of medications, such as those used in the treatment of cancer. The damage can occur to small secondary nerves, or to large primary ones, such as those in the spinal cord and brain. This damage may be temporary or permanent and it may lead to impairments that can range from temporary numbness to permanent paralysis and/or brain death. Allergic Reactions: Any time a substance or material comes in contact with our body, there is the possibility of an allergic reaction. These can range from a mild skin rash (contact dermatitis) to a severe systemic reaction (anaphylactic reaction), which can result in death. Death: In general, any medical intervention can result in death, most of the time due to an unforeseen complication. ____________________________________________________________________________________________    

## 2022-10-17 ENCOUNTER — Telehealth: Payer: Self-pay

## 2022-10-17 DIAGNOSIS — M6281 Muscle weakness (generalized): Secondary | ICD-10-CM | POA: Diagnosis not present

## 2022-10-17 NOTE — Telephone Encounter (Signed)
Post procedure follow up.  Patient states she is doing ok.  

## 2022-10-24 DIAGNOSIS — G4733 Obstructive sleep apnea (adult) (pediatric): Secondary | ICD-10-CM | POA: Diagnosis not present

## 2022-10-29 ENCOUNTER — Telehealth: Payer: Self-pay | Admitting: Family Medicine

## 2022-10-29 ENCOUNTER — Other Ambulatory Visit: Payer: Self-pay | Admitting: Surgery

## 2022-10-29 NOTE — Telephone Encounter (Signed)
Routing to provider to advise.  

## 2022-10-29 NOTE — Telephone Encounter (Signed)
Copied from CRM [id number]. Topic: General - Other >> Oct 26, 2022 11:26 AM Clide Dales wrote: Patient states that when she tried to pick her Ozempic yesterday the cost had went from 25 to 125. Patient would like to know if there is a coupon she could use. Please advise.

## 2022-10-29 NOTE — Telephone Encounter (Signed)
Called and informed pt. Pt stated she was able to get a coupon from the [employer].

## 2022-10-30 ENCOUNTER — Ambulatory Visit: Payer: BC Managed Care – PPO | Attending: Pain Medicine | Admitting: Pain Medicine

## 2022-10-30 ENCOUNTER — Ambulatory Visit
Admission: RE | Admit: 2022-10-30 | Discharge: 2022-10-30 | Disposition: A | Discharge status: Home / Self Care | Payer: BC Managed Care – PPO | Source: Ambulatory Visit | Attending: Pain Medicine | Admitting: Pain Medicine

## 2022-10-30 ENCOUNTER — Encounter: Payer: Self-pay | Admitting: Pain Medicine

## 2022-10-30 VITALS — BP 151/115 | HR 96 | Temp 96.4°F | Resp 16 | Ht 63.0 in | Wt 159.0 lb

## 2022-10-30 DIAGNOSIS — M47819 Spondylosis without myelopathy or radiculopathy, site unspecified: Secondary | ICD-10-CM | POA: Diagnosis not present

## 2022-10-30 DIAGNOSIS — M4310 Spondylolisthesis, site unspecified: Secondary | ICD-10-CM | POA: Insufficient documentation

## 2022-10-30 DIAGNOSIS — M5136 Other intervertebral disc degeneration, lumbar region: Secondary | ICD-10-CM | POA: Insufficient documentation

## 2022-10-30 DIAGNOSIS — M47817 Spondylosis without myelopathy or radiculopathy, lumbosacral region: Secondary | ICD-10-CM | POA: Insufficient documentation

## 2022-10-30 DIAGNOSIS — Z5189 Encounter for other specified aftercare: Secondary | ICD-10-CM | POA: Insufficient documentation

## 2022-10-30 DIAGNOSIS — Z09 Encounter for follow-up examination after completed treatment for conditions other than malignant neoplasm: Secondary | ICD-10-CM | POA: Insufficient documentation

## 2022-10-30 DIAGNOSIS — M2540 Effusion, unspecified joint: Secondary | ICD-10-CM | POA: Insufficient documentation

## 2022-10-30 DIAGNOSIS — G8918 Other acute postprocedural pain: Secondary | ICD-10-CM | POA: Insufficient documentation

## 2022-10-30 DIAGNOSIS — Z888 Allergy status to other drugs, medicaments and biological substances status: Secondary | ICD-10-CM | POA: Diagnosis not present

## 2022-10-30 DIAGNOSIS — M47816 Spondylosis without myelopathy or radiculopathy, lumbar region: Secondary | ICD-10-CM | POA: Insufficient documentation

## 2022-10-30 DIAGNOSIS — G8929 Other chronic pain: Secondary | ICD-10-CM | POA: Diagnosis not present

## 2022-10-30 DIAGNOSIS — Z91041 Radiographic dye allergy status: Secondary | ICD-10-CM | POA: Insufficient documentation

## 2022-10-30 DIAGNOSIS — M545 Low back pain, unspecified: Secondary | ICD-10-CM | POA: Insufficient documentation

## 2022-10-30 DIAGNOSIS — Z91013 Allergy to seafood: Secondary | ICD-10-CM | POA: Diagnosis not present

## 2022-10-30 MED ORDER — ROPIVACAINE HCL 2 MG/ML IJ SOLN
9.0000 mL | Freq: Once | INTRAMUSCULAR | Status: AC
  Administered 2022-10-30: 9 mL via PERINEURAL

## 2022-10-30 MED ORDER — TRIAMCINOLONE ACETONIDE 40 MG/ML IJ SUSP
INTRAMUSCULAR | Status: AC
  Filled 2022-10-30: qty 1

## 2022-10-30 MED ORDER — HYDROCODONE-ACETAMINOPHEN 5-325 MG PO TABS
1.0000 | ORAL_TABLET | Freq: Three times a day (TID) | ORAL | 0 refills | Status: DC | PRN
Start: 2022-11-06 — End: 2022-11-07

## 2022-10-30 MED ORDER — LIDOCAINE HCL 2 % IJ SOLN
INTRAMUSCULAR | Status: AC
  Filled 2022-10-30: qty 20

## 2022-10-30 MED ORDER — PENTAFLUOROPROP-TETRAFLUOROETH EX AERO
INHALATION_SPRAY | Freq: Once | CUTANEOUS | Status: AC
  Administered 2022-10-30: 30 via TOPICAL
  Filled 2022-10-30: qty 116

## 2022-10-30 MED ORDER — TRIAMCINOLONE ACETONIDE 40 MG/ML IJ SUSP
40.0000 mg | Freq: Once | INTRAMUSCULAR | Status: AC
  Administered 2022-10-30: 40 mg

## 2022-10-30 MED ORDER — ROPIVACAINE HCL 2 MG/ML IJ SOLN
INTRAMUSCULAR | Status: AC
  Filled 2022-10-30: qty 20

## 2022-10-30 MED ORDER — LIDOCAINE HCL 2 % IJ SOLN
20.0000 mL | Freq: Once | INTRAMUSCULAR | Status: AC
  Administered 2022-10-30: 400 mg

## 2022-10-30 MED ORDER — HYDROCODONE-ACETAMINOPHEN 5-325 MG PO TABS
1.0000 | ORAL_TABLET | Freq: Three times a day (TID) | ORAL | 0 refills | Status: AC | PRN
Start: 2022-10-30 — End: 2022-11-06

## 2022-10-30 NOTE — Patient Instructions (Addendum)
DON'T FORGET TO TAKE BLOOD PRESSURE MEDICATION AS SOON AS YOU GET HOME. ___________________________________________________________________________________________  Post-Radiofrequency (RF) Discharge Instructions  You have just completed a Radiofrequency Neurotomy.  The following instructions will provide you with information and guidelines for self-care upon discharge.  If at any time you have questions or concerns please call your physician. DO NOT DRIVE YOURSELF!!  Instructions: Apply ice: Fill a plastic sandwich bag with crushed ice. Cover it with a small towel and apply to injection site. Apply for 15 minutes then remove x 15 minutes. Repeat sequence on day of procedure, until you go to bed. The purpose is to minimize swelling and discomfort after procedure. Apply heat: Apply heat to procedure site starting the day following the procedure. The purpose is to treat any soreness and discomfort from the procedure. Food intake: No eating limitations, unless stipulated above.  Nevertheless, if you have had sedation, you may experience some nausea.  In this case, it may be wise to wait at least two hours prior to resuming regular diet. Physical activities: Keep activities to a minimum for the first 8 hours after the procedure. For the first 24 hours after the procedure, do not drive a motor vehicle,  Operate heavy machinery, power tools, or handle any weapons.  Consider walking with the use of an assistive device or accompanied by an adult for the first 24 hours.  Do not drink alcoholic beverages including beer.  Do not make any important decisions or sign any legal documents. Go home and rest today.  Resume activities tomorrow, as tolerated.  Use caution in moving about as you may experience mild leg weakness.  Use caution in cooking, use of household electrical appliances and climbing steps. Driving: If you have received any sedation, you are not allowed to drive for 24 hours after your  procedure. Blood thinner: Restart your blood thinner 6 hours after your procedure. (Only for those taking blood thinners) Insulin: As soon as you can eat, you may resume your normal dosing schedule. (Only for those taking insulin) Medications: May resume pre-procedure medications.  Do not take any drugs, other than what has been prescribed to you. Infection prevention: Keep procedure site clean and dry. Post-procedure Pain Diary: Extremely important that this be done correctly and accurately. Recorded information will be used to determine the next step in treatment. Pain evaluated is that of treated area only. Do not include pain from an untreated area. Complete every hour, on the hour, for the initial 8 hours. Set an alarm to help you do this part accurately. Do not go to sleep and have it completed later. It will not be accurate. Follow-up appointment: Keep your follow-up appointment after the procedure. Usually 2-6 weeks after radiofrequency. Bring you pain diary. The information collected will be essential for your long-term care.   Expect: From numbing medicine (AKA: Local Anesthetics): Numbness or decrease in pain. Onset: Full effect within 15 minutes of injected. Duration: It will depend on the type of local anesthetic used. On the average, 1 to 8 hours.  From steroids (when added): Decrease in swelling or inflammation. Once inflammation is improved, relief of the pain will follow. Onset of benefits: Depends on the amount of swelling present. The more swelling, the longer it will take for the benefits to be seen. In some cases, up to 10 days. Duration: Steroids will stay in the system x 2 weeks. Duration of benefits will depend on multiple posibilities including persistent irritating factors. From procedure: Some discomfort is to be  expected once the numbing medicine wears off. In the case of radiofrequency procedures, this may last as long as 6 weeks. Additional post-procedure pain  medication is provided for this. Discomfort is minimized if ice and heat are applied as instructed.  Call if: You experience numbness and weakness that gets worse with time, as opposed to wearing off. He experience any unusual bleeding, difficulty breathing, or loss of the ability to control your bowel and bladder. (This applies to Spinal procedures only) You experience any redness, swelling, heat, red streaks, elevated temperature, fever, or any other signs of a possible infection.  Emergency Numbers: Durning business hours (Monday - Thursday, 8:00 AM - 4:00 PM) (Friday, 9:00 AM - 12:00 Noon): [phone] After hours: [phone] ____________________________________________________________________________________________

## 2022-10-30 NOTE — Progress Notes (Signed)
PROVIDER NOTE: Interpretation of information contained herein should be left to medically-trained personnel. Specific patient instructions are provided elsewhere under "Patient Instructions" section of medical record. This document was created in part using STT-dictation technology, any transcriptional errors that may result from this process are unintentional.  Patient: Lisa Galvan Type: Established DOB: [birth year] MRN: [id number] PCP: Dorcas Carrow, DO  Service: Procedure DOS: 10/30/2022 Setting: Ambulatory Location: Ambulatory outpatient facility Delivery: Face-to-face Provider: Oswaldo Done, MD Specialty: Interventional Pain Management Specialty designation: 09 Location: Outpatient facility Ref. Prov.: Olevia Perches P, DO       Interventional Therapy   Procedure: Lumbar Facet, Medial Branch Radiofrequency Ablation (RFA) #2  Laterality: Right (-RT)  Level: L2, L3, L4, L5, and S1 Medial Branch Level(s). These levels will denervate the L3-4, L4-5, and L5-S1 lumbar facet joints.  Imaging: Fluoroscopy-guided         Anesthesia: Local anesthesia (1-2% Lidocaine) Anxiolysis: None                 Sedation: No Sedation                       DOS: 10/30/2022  Performed by: Oswaldo Done, MD  Purpose: Therapeutic/Palliative Indications: Low back pain severe enough to impact quality of life or function. Indications: 1. Chronic low back pain (1ry area of Pain) (Bilateral) w/o sciatica   2. Lumbar facet syndrome (Bilateral)   3. Spondylosis without myelopathy or radiculopathy, lumbosacral region   4. Lumbosacral facet arthropathy   5. Lumbar Facet Hypertrophy   6. Lumbar facet effusion/edema (L4-5, L5-S1)   7. Grade 1 Anterolisthesis of L4/5   8. DDD (degenerative disc disease), lumbar    Lisa Galvan. Lisa Galvan has been dealing with the above chronic pain for longer than three months and has either failed to respond, was unable to tolerate, or simply did not get enough benefit from other  more conservative therapies including, but not limited to: 1. Over-the-counter medications 2. Anti-inflammatory medications 3. Muscle relaxants 4. Membrane stabilizers 5. Opioids 6. Physical therapy and/or chiropractic manipulation 7. Modalities (Heat, ice, etc.) 8. Invasive techniques such as nerve blocks. Lisa Galvan. Lisa Galvan has attained more than 50% relief of the pain from a series of diagnostic injections conducted in separate occasions.  Pain Score: Pre-procedure: 4 /10 Post-procedure: 4 /10     Position / Prep / Materials:  Position: Prone  Prep solution: DuraPrep (Iodine Povacrylex [0.7% available iodine] and Isopropyl Alcohol, 74% w/w) Prep Area: Entire Lumbosacral Region (Lower back from mid-thoracic region to end of tailbone and from flank to flank.) Materials:  Tray: RFA (Radiofrequency) tray Needle(s):  Type: RFA (Teflon-coated radiofrequency ablation needles) Gauge (G): 22  Length: Regular (10cm) Qty: 5      Post-procedure evaluation   Procedure: Lumbar Facet, Medial Branch Radiofrequency Ablation (RFA) #2  Laterality: Left (-LT)  Level: L2, L3, L4, L5, and S1 Medial Branch Level(s). These levels will denervate the L3-4, L4-5, and L5-S1 lumbar facet joints.  Imaging: Fluoroscopy-guided         Anesthesia: Local anesthesia (1-2% Lidocaine) Anxiolysis: None                 Sedation: No Sedation                       DOS: 10/16/2022  Performed by: Oswaldo Done, MD  Purpose: Therapeutic/Palliative Indications: Low back pain severe enough to impact quality of life or function.  Pain Score: Pre-procedure:  6 /10 Post-procedure: 0-No pain/10     Effectiveness:  Initial hour after procedure: 80 %. Subsequent 4-6 hours post-procedure: 80 %. Analgesia past initial 6 hours: 80 % (with the exception of the spot on the left lumbar). Ongoing improvement:  Analgesic: The patient indicates that having an ongoing 80% improvement of the pain in the left lower back.  She  has an area around the PSIS where she is still having some discomfort however, it has not been 6 weeks since she had her radiofrequency and therefore she is in the postop period where she is still having some pain. Function: Lisa Galvan. Lisa Galvan reports improvement in function ROM: Lisa Galvan. Lisa Galvan reports improvement in ROM  H&P (Pre-op Assessment):  Lisa Galvan. Lisa Galvan is a 62 y.o. (year old), female patient, seen today for interventional treatment. She  has a past surgical history that includes Esophagogastroduodenoscopy (egd) with propofol (N/A, 03/12/2017); Umbilical hernia repair (N/A, 05/01/2017); Hernia repair; Esophagogastroduodenoscopy (egd) with propofol (N/A, 03/30/2019); Colonoscopy with propofol (N/A, 03/30/2019); ORIF ankle fracture (Left, 07/30/2019); XI robotic assisted ventral hernia (N/A, 10/06/2019); Incisional hernia repair (N/A, 11/29/2020); Supracervical abdominal hysterectomy; Abdominal hysterectomy; Ankle Closed Reduction (Right, 04/15/2022); and ORIF ankle fracture (Right, 05/03/2022). Lisa Galvan. Lisa Galvan has a current medication list which includes the following prescription(s): accu-chek softclix lancets, aspirin ec, atorvastatin, vitamin d-3, duloxetine, epinephrine, fexofenadine, gabapentin, hydrocodone-acetaminophen, [START ON 11/06/2022] hydrocodone-acetaminophen, magnesium gluconate, metformin, metoprolol succinate, omeprazole, ozempic (0.25 or 0.5 mg/dose), sucralfate, cyanocobalamin, and enoxaparin. Her primarily concern today is the Back Pain (Left lumbar )  Initial Vital Signs:  Pulse/HCG Rate: 98  Temp: (!) 96.4 F (35.8 C) Resp: 16 BP: (!) 124/99 SpO2: 99 %  BMI: Estimated body mass index is 28.17 kg/m as calculated from the following:   Height as of this encounter: 5\' 3"  (1.6 m).   Weight as of this encounter: 159 lb (72.1 kg).  Risk Assessment: Allergies: Reviewed. She is allergic to benazepril, ivp dye [iodinated contrast media], lidoderm [lidocaine], lyrica [pregabalin], shellfish allergy,  shellfish-derived products, trulicity [dulaglutide], and ace inhibitors.  Allergy Precautions: None required Coagulopathies: Reviewed. None identified.  Blood-thinner therapy: None at this time Active Infection(s): Reviewed. None identified. Lisa Galvan. Hubert is afebrile  Site Confirmation: Lisa Galvan. Muckle was asked to confirm the procedure and laterality before marking the site Procedure checklist: Completed Consent: Before the procedure and under the influence of no sedative(s), amnesic(s), or anxiolytics, the patient was informed of the treatment options, risks and possible complications. To fulfill our ethical and legal obligations, as recommended by the American Medical Association's Code of Ethics, I have informed the patient of my clinical impression; the nature and purpose of the treatment or procedure; the risks, benefits, and possible complications of the intervention; the alternatives, including doing nothing; the risk(s) and benefit(s) of the alternative treatment(s) or procedure(s); and the risk(s) and benefit(s) of doing nothing. The patient was provided information about the general risks and possible complications associated with the procedure. These may include, but are not limited to: failure to achieve desired goals, infection, bleeding, organ or nerve damage, allergic reactions, paralysis, and death. In addition, the patient was informed of those risks and complications associated to Spine-related procedures, such as failure to decrease pain; infection (i.e.: Meningitis, epidural or intraspinal abscess); bleeding (i.e.: epidural hematoma, subarachnoid hemorrhage, or any other type of intraspinal or peri-dural bleeding); organ or nerve damage (i.e.: Any type of peripheral nerve, nerve root, or spinal cord injury) with subsequent damage to sensory, motor, and/or autonomic systems, resulting in permanent pain, numbness, and/or weakness  of one or several areas of the body; allergic reactions; (i.e.:  anaphylactic reaction); and/or death. Furthermore, the patient was informed of those risks and complications associated with the medications. These include, but are not limited to: allergic reactions (i.e.: anaphylactic or anaphylactoid reaction(s)); adrenal axis suppression; blood sugar elevation that in diabetics may result in ketoacidosis or comma; water retention that in patients with history of congestive heart failure may result in shortness of breath, pulmonary edema, and decompensation with resultant heart failure; weight gain; swelling or edema; medication-induced neural toxicity; particulate matter embolism and blood vessel occlusion with resultant organ, and/or nervous system infarction; and/or aseptic necrosis of one or more joints. Finally, the patient was informed that Medicine is not an exact science; therefore, there is also the possibility of unforeseen or unpredictable risks and/or possible complications that may result in a catastrophic outcome. The patient indicated having understood very clearly. We have given the patient no guarantees and we have made no promises. Enough time was given to the patient to ask questions, all of which were answered to the patient's satisfaction. Lisa Galvan. Godby has indicated that she wanted to continue with the procedure. Attestation: I, the ordering provider, attest that I have discussed with the patient the benefits, risks, side-effects, alternatives, likelihood of achieving goals, and potential problems during recovery for the procedure that I have provided informed consent. Date  Time: 10/30/2022 10:42 AM   Pre-Procedure Preparation:  Monitoring: As per clinic protocol. Respiration, ETCO2, SpO2, BP, heart rate and rhythm monitor placed and checked for adequate function Safety Precautions: Patient was assessed for positional comfort and pressure points before starting the procedure. Time-out: I initiated and conducted the "Time-out" before starting the  procedure, as per protocol. The patient was asked to participate by confirming the accuracy of the "Time Out" information. Verification of the correct person, site, and procedure were performed and confirmed by me, the nursing staff, and the patient. "Time-out" conducted as per Joint Commission's Universal Protocol (UP.01.01.01). Time: 1126 Start Time: 1126 hrs.  Description of Procedure:          Laterality: See above. Levels:  See above. Safety Precautions: Aspiration looking for blood return was conducted prior to all injections. At no point did we inject any substances, as a needle was being advanced. Before injecting, the patient was told to immediately notify me if she was experiencing any new onset of "ringing in the ears, or metallic taste in the mouth". No attempts were made at seeking any paresthesias. Safe injection practices and needle disposal techniques used. Medications properly checked for expiration dates. SDV (single dose vial) medications used. After the completion of the procedure, all disposable equipment used was discarded in the proper designated medical waste containers. Local Anesthesia: Protocol guidelines were followed. The patient was positioned over the fluoroscopy table. The area was prepped in the usual manner. The time-out was completed. The target area was identified using fluoroscopy. A 12-in long, straight, sterile hemostat was used with fluoroscopic guidance to locate the targets for each level blocked. Once located, the skin was marked with an approved surgical skin marker. Once all sites were marked, the skin (epidermis, dermis, and hypodermis), as well as deeper tissues (fat, connective tissue and muscle) were infiltrated with a small amount of a short-acting local anesthetic, loaded on a 10cc syringe with a 25G, 1.5-in  Needle. An appropriate amount of time was allowed for local anesthetics to take effect before proceeding to the next step. Technical description of  process:  Radiofrequency  Ablation (RFA) L2 Medial Branch Nerve RFA: The target area for the L2 medial branch is at the junction of the postero-lateral aspect of the superior articular process and the superior, posterior, and medial edge of the transverse process of L3. Under fluoroscopic guidance, a Radiofrequency needle was inserted until contact was made with os over the superior postero-lateral aspect of the pedicular shadow (target area). Sensory and motor testing was conducted to properly adjust the position of the needle. Once satisfactory placement of the needle was achieved, the numbing solution was slowly injected after negative aspiration for blood. 2.0 mL of the nerve block solution was injected without difficulty or complication. After waiting for at least 3 minutes, the ablation was performed. Once completed, the needle was removed intact. L3 Medial Branch Nerve RFA: The target area for the L3 medial branch is at the junction of the postero-lateral aspect of the superior articular process and the superior, posterior, and medial edge of the transverse process of L4. Under fluoroscopic guidance, a Radiofrequency needle was inserted until contact was made with os over the superior postero-lateral aspect of the pedicular shadow (target area). Sensory and motor testing was conducted to properly adjust the position of the needle. Once satisfactory placement of the needle was achieved, the numbing solution was slowly injected after negative aspiration for blood. 2.0 mL of the nerve block solution was injected without difficulty or complication. After waiting for at least 3 minutes, the ablation was performed. Once completed, the needle was removed intact. L4 Medial Branch Nerve RFA: The target area for the L4 medial branch is at the junction of the postero-lateral aspect of the superior articular process and the superior, posterior, and medial edge of the transverse process of L5. Under fluoroscopic  guidance, a Radiofrequency needle was inserted until contact was made with os over the superior postero-lateral aspect of the pedicular shadow (target area). Sensory and motor testing was conducted to properly adjust the position of the needle. Once satisfactory placement of the needle was achieved, the numbing solution was slowly injected after negative aspiration for blood. 2.0 mL of the nerve block solution was injected without difficulty or complication. After waiting for at least 3 minutes, the ablation was performed. Once completed, the needle was removed intact. L5 Medial Branch Nerve RFA: The target area for the L5 medial branch is at the junction of the postero-lateral aspect of the superior articular process of S1 and the superior, posterior, and medial edge of the sacral ala. Under fluoroscopic guidance, a Radiofrequency needle was inserted until contact was made with os over the superior postero-lateral aspect of the pedicular shadow (target area). Sensory and motor testing was conducted to properly adjust the position of the needle. Once satisfactory placement of the needle was achieved, the numbing solution was slowly injected after negative aspiration for blood. 2.0 mL of the nerve block solution was injected without difficulty or complication. After waiting for at least 3 minutes, the ablation was performed. Once completed, the needle was removed intact. S1 Medial Branch Nerve RFA: The target area for the S1 medial branch is located inferior to the junction of the S1 superior articular process and the L5 inferior articular process, posterior, inferior, and lateral to the 6 o'clock position of the L5-S1 facet joint, just superior to the S1 posterior foramen. Under fluoroscopic guidance, the Radiofrequency needle was advanced until contact was made with os over the Target area. Sensory and motor testing was conducted to properly adjust the position of the needle. Once  satisfactory placement of the  needle was achieved, the numbing solution was slowly injected after negative aspiration for blood. 2.0 mL of the nerve block solution was injected without difficulty or complication. After waiting for at least 3 minutes, the ablation was performed. Once completed, the needle was removed intact. Radiofrequency lesioning (ablation):  Radiofrequency Generator: Medtronic AccurianTM AG 1000 RF Generator Sensory Stimulation Parameters: 50 Hz was used to locate & identify the nerve, making sure that the needle was positioned such that there was no sensory stimulation below 0.3 V or above 0.7 V. Motor Stimulation Parameters: 2 Hz was used to evaluate the motor component. Care was taken not to lesion any nerves that demonstrated motor stimulation of the lower extremities at an output of less than 2.5 times that of the sensory threshold, or a maximum of 2.0 V. Lesioning Technique Parameters: Standard Radiofrequency settings. (Not bipolar or pulsed.) Temperature Settings: 80 degrees C Lesioning time: 60 seconds Stationary intra-operative compliance: Compliant  Once the entire procedure was completed, the treated area was cleaned, making sure to leave some of the prepping solution back to take advantage of its long term bactericidal properties.    Illustration of the posterior view of the lumbar spine and the posterior neural structures. Laminae of L2 through S1 are labeled. DPRL5, dorsal primary ramus of L5; DPRS1, dorsal primary ramus of S1; DPR3, dorsal primary ramus of L3; FJ, facet (zygapophyseal) joint L3-L4; I, inferior articular process of L4; LB1, lateral branch of dorsal primary ramus of L1; IAB, inferior articular branches from L3 medial branch (supplies L4-L5 facet joint); IBP, intermediate branch plexus; MB3, medial branch of dorsal primary ramus of L3; NR3, third lumbar nerve root; S, superior articular process of L5; SAB, superior articular branches from L4 (supplies L4-5 facet joint also); TP3,  transverse process of L3.  Facet Joint Innervation (* possible contribution)  L1-2 T12, L1 (L2*)  Medial Branch  L2-3 L1, L2 (L3*)         "          "  L3-4 L2, L3 (L4*)         "          "  L4-5 L3, L4 (L5*)         "          "  L5-S1 L4, L5, S1          "          "    Vitals:   10/30/22 1122 10/30/22 1130 10/30/22 1140 10/30/22 1149  BP: (!) 143/105 (!) 140/112 (!) 153/117 (!) 151/115  Pulse: 97 96 98 96  Resp: 20 15  16   Temp:      TempSrc:      SpO2: 100% 100% 100% 100%  Weight:      Height:        Start Time: 1126 hrs. End Time: 1147 hrs.  Imaging Guidance (Spinal):          Type of Imaging Technique: Fluoroscopy Guidance (Spinal) Indication(s): Assistance in needle guidance and placement for procedures requiring needle placement in or near specific anatomical locations not easily accessible without such assistance. Exposure Time: Please see nurses notes. Contrast: None used. Fluoroscopic Guidance: I was personally present during the use of fluoroscopy. "Tunnel Vision Technique" used to obtain the best possible view of the target area. Parallax error corrected before commencing the procedure. "Direction-depth-direction" technique used to introduce the needle under continuous pulsed fluoroscopy. Once target was  reached, antero-posterior, oblique, and lateral fluoroscopic projection used confirm needle placement in all planes. Images permanently stored in EMR. Interpretation: No contrast injected. I personally interpreted the imaging intraoperatively. Adequate needle placement confirmed in multiple planes. Permanent images saved into the patient's record.  Antibiotic Prophylaxis:   Anti-infectives (From admission, onward)    None      Indication(s): None identified  Post-operative Assessment:  Post-procedure Vital Signs:  Pulse/HCG Rate: 96  Temp: (!) 96.4 F (35.8 C) Resp: 16 BP: (!) 151/115 SpO2: 100 %  EBL: None  Complications: No immediate  post-treatment complications observed by team, or reported by patient.  Note: The patient tolerated the entire procedure well. A repeat set of vitals were taken after the procedure and the patient was kept under observation following institutional policy, for this type of procedure. Post-procedural neurological assessment was performed, showing return to baseline, prior to discharge. The patient was provided with post-procedure discharge instructions, including a section on how to identify potential problems. Should any problems arise concerning this procedure, the patient was given instructions to immediately contact us, at any time, without hesitation. In any case, we plan to contact the patient by telephone for a follow-up status report regarding this interventional procedure.  Comments:  No additional relevant information.  Plan of Care (POC)  Orders:  Orders Placed This Encounter  Procedures   Radiofrequency,Lumbar    Scheduling Instructions:     Side(s): Right-sided     Level: L3-4, L4-5, and L5-S1 Facets (L2, L3, L4, L5, and S1 Medial Branch)     Sedation: Patient's choice.     Timeframe: Today    Order Specific Question:   Where will this procedure be performed?    Answer:   [facility]   DG PAIN [facility] C-ARM 1-60 MIN NO REPORT    Intraoperative interpretation by procedural physician at [facility].    Standing Status:   Standing    Number of Occurrences:   1    Order Specific Question:   Reason for exam:    Answer:   Assistance in needle guidance and placement for procedures requiring needle placement in or near specific anatomical locations not easily accessible without such assistance.   Informed Consent Details: Physician/Practitioner Attestation; Transcribe to consent form and obtain patient signature    Nursing Order: Transcribe to consent form and obtain patient signature. Note: Always confirm laterality of pain with Lisa Galvan. Richardson Dopp, before procedure.    Order  Specific Question:   Physician/Practitioner attestation of informed consent for procedure/surgical case    Answer:   I, the physician/practitioner, attest that I have discussed with the patient the benefits, risks, side effects, alternatives, likelihood of achieving goals and potential problems during recovery for the procedure that I have provided informed consent.    Order Specific Question:   Procedure    Answer:   Lumbar Facet Radiofrequency Ablation    Order Specific Question:   Physician/Practitioner performing the procedure    Answer:   Meiah Zamudio A. Laban Emperor, MD    Order Specific Question:   Indication/Reason    Answer:   Low Back Pain, with our without leg pain, due to Facet Joint Arthralgia (Joint Pain) known as Lumbar Facet Syndrome, secondary to Lumbar, and/or Lumbosacral Spondylosis (Arthritis of the Spine), without myelopathy or radiculopathy (Nerve Damage).   Provide equipment / supplies at bedside    Procedure tray: "Radiofrequency Tray" Additional material: Large hemostat (x1); Small hemostat (x1); Towels (x8); 4x4 sterile sponge pack (x1) Needle type:  Teflon-coated Radiofrequency Needle (Disposable  single use) Size: Regular Quantity: 5    Standing Status:   Standing    Number of Occurrences:   1    Order Specific Question:   Specify    Answer:   Radiofrequency Tray   Nursing Instructions:    Please complete this patient's postprocedure evaluation.    Scheduling Instructions:     Please complete this patient's postprocedure evaluation.   Miscellanous precautions    Standing Status:   Standing    Number of Occurrences:   1   Chronic Opioid Analgesic:  No opioid analgesics prescribed by our practice.   Medications ordered for procedure: Meds ordered this encounter  Medications   lidocaine (XYLOCAINE) 2 % (with pres) injection 400 mg   pentafluoroprop-tetrafluoroeth (GEBAUERS) aerosol   ropivacaine (PF) 2 mg/mL (0.2%) (NAROPIN) injection 9 mL   triamcinolone acetonide  (KENALOG-40) injection 40 mg   HYDROcodone-acetaminophen (NORCO/VICODIN) 5-325 MG tablet    Sig: Take 1 tablet by mouth every 8 (eight) hours as needed for up to 7 days for severe pain. Must last 7 days.    Dispense:  21 tablet    Refill:  0    For acute post-operative pain. Not to be refilled. Must last 7 days.   HYDROcodone-acetaminophen (NORCO/VICODIN) 5-325 MG tablet    Sig: Take 1 tablet by mouth every 8 (eight) hours as needed for up to 7 days for severe pain. Must last for 7 days.    Dispense:  21 tablet    Refill:  0    For acute post-operative pain. Not to be refilled. Must last 7 days.   Medications administered: We administered lidocaine, pentafluoroprop-tetrafluoroeth, ropivacaine (PF) 2 mg/mL (0.2%), and triamcinolone acetonide.  See the medical record for exact dosing, route, and time of administration.  Follow-up plan:   Return in about 6 weeks (around 12/11/2022) for Proc-day (T,Th), (Face2F), (PPE).       Interventional Therapies  Risk  Complexity Considerations:   Estimated body mass index is 26.57 kg/m as calculated from the following:   Height as of 01/31/21: 5\' 3"  (1.6 m).   Weight as of 01/31/21: 150 lb (68 kg). NOTE: IVP DYE ALLERGY - ANAPHYLAXIS IODINE ALLERGY - ANAPHYLAXIS SHELLFISH ALLERGY - ANAPHYLAXIS   Planned  Pending:   Therapeutic bilateral lumbar facet RFA #2, starting with the left side.   Under consideration:   Therapeutic bilateral lumbar facet RFA #2  Therapeutic left L4-5 LESI #3 (+ bilateral L5 TFESI #3 - Denied by BCBS) Therapeutic caudal ESI #3  Therapeutic Qutenza treatment of her diabetic peripheral neuropathy.   Completed:   Diagnostic/therapeutic right CESI x1 (08/03/2021) (100/100/80/80)  Palliative right lumbar facet RFA x1 (06/02/2019) (100/100/95)  Palliative left lumbar facet RFA x1 (06/30/2019) (100/100/95-100)  Palliative bilateral lumbar facet MBB x2 (04/21/2019) (100/100/40/40)  Palliative right sacroiliac joint Blk x1  (04/21/2019) (100/100/60/40)  Diagnostic caudal (Midline to right) ESI x2 (07/04/2021) (75/75/75/75)  Diagnostic/therapeutic bilateral L5 TFESI x2 (10/31/2021) (LBP:100/100/100/100) (LEP:100/100/80/80)  Diagnostic/therapeutic left L4-5 LESI x3 (02/13/2022) (LBP:100/100/100/100) (LEP:100/100/80/80)    Completed by other providers:   Diagnostic/therapeutic right L4-5 LESI x1 (12/29/2014) by Dr. Ewing Schlein  CT injections of wrist (?)  EMG/PNCV (LE) (03/20/2017) Dx: (R) sural sensory peripheral neuropathy  EMG/PNCV (LE) (03/02/2019) Dx: generalized sensory polyneuropathy, no lumbar radiculopathy  EMG/PNCV (UE) (07/25/2021) Dx: generalized sensory polyneuropathy, superimposed (B) carpal tunnel syndrome    Therapeutic  Palliative (PRN) options:   Therapeutic L4-5 LESI + L5 TFESI  Palliative lumbar facet  RFA  Palliative lumbar facet MBB  Palliative sacroiliac joint Blk  Diagnostic caudal ESI    Pharmacotherapy  Nonopioids transferred 02/01/2020: Lyrica, magnesium          Recent Visits Date Type Provider Dept  10/16/22 Procedure visit Delano Metz, MD [facility]  09/27/22 Office Visit Delano Metz, MD [facility]  08/23/22 Procedure visit Delano Metz, MD [facility]  08/20/22 Office Visit Delano Metz, MD [facility]  Showing recent visits within past 90 days and meeting all other requirements Today's Visits Date Type Provider Dept  10/30/22 Procedure visit Delano Metz, MD [facility]  Showing today's visits and meeting all other requirements Future Appointments Date Type Provider Dept  12/11/22 Appointment Delano Metz, MD [facility]  Showing future appointments within next 90 days and meeting all other requirements  Disposition: Discharge home  Discharge (Date  Time): 10/30/2022;   hrs.   Primary Care Physician: Dorcas Carrow, DO Location: [facility] Outpatient Pain Management  Facility Note by: Oswaldo Done, MD (TTS technology used. I apologize for any typographical errors that were not detected and corrected.) Date: 10/30/2022; Time: 12:16 PM  Disclaimer:  Medicine is not an Visual merchandiser. The only guarantee in medicine is that nothing is guaranteed. It is important to note that the decision to proceed with this intervention was based on the information collected from the patient. The Data and conclusions were drawn from the patient's questionnaire, the interview, and the physical examination. Because the information was provided in large part by the patient, it cannot be guaranteed that it has not been purposely or unconsciously manipulated. Every effort has been made to obtain as much relevant data as possible for this evaluation. It is important to note that the conclusions that lead to this procedure are derived in large part from the available data. Always take into account that the treatment will also be dependent on availability of resources and existing treatment guidelines, considered by other Pain Management Practitioners as being common knowledge and practice, at the time of the intervention. For Medico-Legal purposes, it is also important to point out that variation in procedural techniques and pharmacological choices are the acceptable norm. The indications, contraindications, technique, and results of the above procedure should only be interpreted and judged by a Board-Certified Interventional Pain Specialist with extensive familiarity and expertise in the same exact procedure and technique.

## 2022-10-30 NOTE — Progress Notes (Signed)
Safety precautions to be maintained throughout the outpatient stay will include: orient to surroundings, keep bed in low position, maintain call bell within reach at all times, provide assistance with transfer out of bed and ambulation.  

## 2022-10-31 ENCOUNTER — Telehealth: Payer: Self-pay | Admitting: *Deleted

## 2022-10-31 ENCOUNTER — Ambulatory Visit: Payer: Self-pay | Admitting: *Deleted

## 2022-10-31 NOTE — Telephone Encounter (Signed)
Attempted to call for post procedure follow-up. Message left. 

## 2022-10-31 NOTE — Patient Outreach (Signed)
  Care Coordination   10/31/2022 Name: Lisa Galvan MRN: [id number] DOB: 1960/09/18   Care Coordination Outreach Attempts:  An unsuccessful telephone outreach was attempted for a scheduled appointment today.  Follow Up Plan:  Additional outreach attempts will be made to offer the patient care coordination information and services.   Encounter Outcome:  No Answer   Care Coordination Interventions:  No, not indicated    Kemper Durie, RN, MSN, [facility] Care Management Care Management Coordinator [phone]

## 2022-11-01 ENCOUNTER — Other Ambulatory Visit: Payer: Self-pay

## 2022-11-01 ENCOUNTER — Encounter
Admission: RE | Admit: 2022-11-01 | Discharge: 2022-11-01 | Disposition: A | Discharge status: Home / Self Care | Payer: BC Managed Care – PPO | Source: Ambulatory Visit | Attending: Surgery | Admitting: Surgery

## 2022-11-01 VITALS — Ht 63.0 in | Wt 159.0 lb

## 2022-11-01 DIAGNOSIS — E1140 Type 2 diabetes mellitus with diabetic neuropathy, unspecified: Secondary | ICD-10-CM

## 2022-11-01 DIAGNOSIS — I10 Essential (primary) hypertension: Secondary | ICD-10-CM

## 2022-11-01 DIAGNOSIS — G4733 Obstructive sleep apnea (adult) (pediatric): Secondary | ICD-10-CM

## 2022-11-01 DIAGNOSIS — E876 Hypokalemia: Secondary | ICD-10-CM

## 2022-11-01 DIAGNOSIS — E785 Hyperlipidemia, unspecified: Secondary | ICD-10-CM

## 2022-11-01 HISTORY — DX: COVID-19: U07.1

## 2022-11-01 NOTE — Patient Instructions (Addendum)
Your procedure is scheduled on: Wednesday 11/07/22 To find out your arrival time, please call [phone] between 1PM - 3PM on:   Tuesday 11/06/22 Report to the Registration Desk on the 1st floor of the Medical [facility]. Free Valet parking is available.  If your arrival time is 6:00 am, do not arrive before that time as the [facility] entrance doors do not open until 6:00 am.  REMEMBER: Instructions that are not followed completely may result in serious medical risk, up to and including death; or upon the discretion of your surgeon and anesthesiologist your surgery may need to be rescheduled.  Do not eat food after midnight the night before surgery.  No gum chewing or hard candies.  You may however, drink CLEAR liquids up to 2 hours before you are scheduled to arrive for your surgery. Do not drink anything within 2 hours of your scheduled arrival time.  Type 1 and Type 2 diabetics should only drink water.  In addition, your doctor has ordered for you to drink the provided:   Gatorade G2 Drinking this  up to two hours before surgery helps to reduce insulin resistance and improve patient outcomes. Please complete drinking 2 hours before scheduled arrival time.  One week prior to surgery: Stop Anti-inflammatories (NSAIDS) such as Advil, Aleve, Ibuprofen, Motrin, Naproxen, Naprosyn and Aspirin based products such as Excedrin, Goody's Powder, BC Powder. You may however, continue to take Tylenol if needed for pain up until the day of surgery.  Stop ANY OVER THE COUNTER supplements or vitamins until after surgery.  Continue taking all prescription medications with the exception of the following: Metformin, hold for 2 days, last dose will be Sunday night 11/04/22. Ozempic, hold for 7 days, last dose is Wednesday 10/31/22. Aspirin, hold 5-7 days, last dose today.  TAKE ONLY THESE MEDICATIONS THE MORNING OF SURGERY WITH A SIP OF WATER:  atorvastatin (LIPITOR) 20 MG tablet  DULoxetine (CYMBALTA) 60  MG capsule  gabapentin (NEURONTIN) 300 MG capsule  metoprolol succinate (TOPROL-XL) 25 MG 24 hr tablet  omeprazole (PRILOSEC) 40 MG capsule Antacid (take one the night before and one on the morning of surgery - helps to prevent nausea after surgery.) fexofenadine (ALLERGY RELIEF) 180 MG tablet if needed HYDROcodone-acetaminophen (NORCO/VICODIN) 5-325 MG tablet if needed  No Alcohol for 24 hours before or after surgery.  No Smoking including e-cigarettes for 24 hours before surgery.  No chewable tobacco products for at least 6 hours before surgery.  No nicotine patches on the day of surgery.  Do not use any "recreational" drugs for at least a week (preferably 2 weeks) before your surgery.  Please be advised that the combination of cocaine and anesthesia may have negative outcomes, up to and including death. If you test positive for cocaine, your surgery will be cancelled.  On the morning of surgery brush your teeth with toothpaste and water, you may rinse your mouth with mouthwash if you wish. Do not swallow any toothpaste or mouthwash.  Use CHG Soap or wipes as directed on instruction sheet.  Do not wear lotions, powders, or perfumes. No deodorant.  Do not shave body hair from the neck down 48 hours before surgery.  Wear comfortable clothing (specific to your surgery type) to the hospital.  Do not wear jewelry, make-up, hairpins, clips or nail polish.  Contact lenses, hearing aids and dentures may not be worn into surgery.  Bring your C-PAP to the hospital in case you may have to spend the night.  Do not bring valuables to the hospital. [facility] is not responsible for any missing/lost belongings or valuables.   Notify your doctor if there is any change in your medical condition (cold, fever, infection).  If you are being discharged the day of surgery, you will not be allowed to drive home. You will need a responsible individual to drive you home and stay with you for 24  hours after surgery.   If you are taking public transportation, you will need to have a responsible individual with you.  If you are being admitted to the hospital overnight, leave your suitcase in the car. After surgery it may be brought to your room.  In case of increased patient census, it may be necessary for you, the patient, to continue your postoperative care in the Same Day Surgery department.  After surgery, you can help prevent lung complications by doing breathing exercises.  Take deep breaths and cough every 1-2 hours. Your doctor may order a device called an Incentive Spirometer to help you take deep breaths. When coughing or sneezing, hold a pillow firmly against your incision with both hands. This is called "splinting." Doing this helps protect your incision. It also decreases belly discomfort.  Surgery Visitation Policy:  Patients undergoing a surgery or procedure may have two family members or support persons with them as long as the person is not COVID-19 positive or experiencing its symptoms.   Inpatient Visitation:    Visiting hours are 7 a.m. to 8 p.m. Up to four visitors are allowed at one time in a patient room. The visitors may rotate out with other people during the day. One designated support person (adult) may remain overnight.  Please call the Pre-admissions Testing Dept. at [phone] if you have any questions about these instructions.     Preparing for Surgery with CHLORHEXIDINE GLUCONATE (CHG) Soap  Chlorhexidine Gluconate (CHG) Soap  o An antiseptic cleaner that kills germs and bonds with the skin to continue killing germs even after washing  o Used for showering the night before surgery and morning of surgery  Before surgery, you can play an important role by reducing the number of germs on your skin.  CHG (Chlorhexidine gluconate) soap is an antiseptic cleanser which kills germs and bonds with the skin to continue killing germs even after  washing.  Please do not use if you have an allergy to CHG or antibacterial soaps. If your skin becomes reddened/irritated stop using the CHG.  1. Shower the NIGHT BEFORE SURGERY and the MORNING OF SURGERY with CHG soap.  2. If you choose to wash your hair, wash your hair first as usual with your normal shampoo.  3. After shampooing, rinse your hair and body thoroughly to remove the shampoo.  4. Use CHG as you would any other liquid soap. You can apply CHG directly to the skin and wash gently with a scrungie or a clean washcloth.  5. Apply the CHG soap to your body only from the neck down. Do not use on open wounds or open sores. Avoid contact with your eyes, ears, mouth, and genitals (private parts). Wash face and genitals (private parts) with your normal soap.  6. Wash thoroughly, paying special attention to the area where your surgery will be performed.  7. Thoroughly rinse your body with warm water.  8. Do not shower/wash with your normal soap after using and rinsing off the CHG soap.  9. Pat yourself dry with a clean towel.  10. Wear  clean pajamas to bed the night before surgery.  12. Place clean sheets on your bed the night of your first shower and do not sleep with pets.  13. Shower again with the CHG soap on the day of surgery prior to arriving at the hospital.  14. Do not apply any deodorants/lotions/powders.  15. Please wear clean clothes to the hospital.

## 2022-11-05 ENCOUNTER — Encounter: Payer: Self-pay | Admitting: Urgent Care

## 2022-11-05 ENCOUNTER — Encounter
Admission: RE | Admit: 2022-11-05 | Discharge: 2022-11-05 | Disposition: A | Discharge status: Home / Self Care | Payer: BC Managed Care – PPO | Source: Ambulatory Visit | Attending: Surgery | Admitting: Surgery

## 2022-11-05 DIAGNOSIS — I10 Essential (primary) hypertension: Secondary | ICD-10-CM | POA: Insufficient documentation

## 2022-11-05 DIAGNOSIS — E876 Hypokalemia: Secondary | ICD-10-CM | POA: Insufficient documentation

## 2022-11-05 DIAGNOSIS — G4733 Obstructive sleep apnea (adult) (pediatric): Secondary | ICD-10-CM | POA: Insufficient documentation

## 2022-11-05 DIAGNOSIS — E1140 Type 2 diabetes mellitus with diabetic neuropathy, unspecified: Secondary | ICD-10-CM

## 2022-11-05 DIAGNOSIS — K219 Gastro-esophageal reflux disease without esophagitis: Secondary | ICD-10-CM | POA: Diagnosis not present

## 2022-11-05 DIAGNOSIS — E785 Hyperlipidemia, unspecified: Secondary | ICD-10-CM | POA: Insufficient documentation

## 2022-11-05 DIAGNOSIS — G5601 Carpal tunnel syndrome, right upper limb: Secondary | ICD-10-CM | POA: Diagnosis not present

## 2022-11-05 DIAGNOSIS — Z01818 Encounter for other preprocedural examination: Secondary | ICD-10-CM | POA: Insufficient documentation

## 2022-11-05 DIAGNOSIS — M1990 Unspecified osteoarthritis, unspecified site: Secondary | ICD-10-CM | POA: Diagnosis not present

## 2022-11-05 DIAGNOSIS — Z7985 Long-term (current) use of injectable non-insulin antidiabetic drugs: Secondary | ICD-10-CM | POA: Diagnosis not present

## 2022-11-05 DIAGNOSIS — Z7984 Long term (current) use of oral hypoglycemic drugs: Secondary | ICD-10-CM | POA: Diagnosis not present

## 2022-11-05 DIAGNOSIS — Z833 Family history of diabetes mellitus: Secondary | ICD-10-CM | POA: Diagnosis not present

## 2022-11-05 DIAGNOSIS — Z79899 Other long term (current) drug therapy: Secondary | ICD-10-CM | POA: Diagnosis not present

## 2022-11-05 DIAGNOSIS — Z01810 Encounter for preprocedural cardiovascular examination: Secondary | ICD-10-CM | POA: Diagnosis not present

## 2022-11-05 DIAGNOSIS — I700 Atherosclerosis of aorta: Secondary | ICD-10-CM | POA: Diagnosis not present

## 2022-11-05 DIAGNOSIS — Z7982 Long term (current) use of aspirin: Secondary | ICD-10-CM | POA: Diagnosis not present

## 2022-11-05 LAB — CBC
HCT: 36.1 % (ref 36.0–46.0)
Hemoglobin: 12.6 g/dL (ref 12.0–15.0)
MCH: 33.3 pg (ref 26.0–34.0)
MCHC: 34.9 g/dL (ref 30.0–36.0)
MCV: 95.5 fL (ref 80.0–100.0)
Platelets: 244 10*3/uL (ref 150–400)
RBC: 3.78 MIL/uL — ABNORMAL LOW (ref 3.87–5.11)
RDW: 13.4 % (ref 11.5–15.5)
WBC: 7.1 10*3/uL (ref 4.0–10.5)
nRBC: 0 % (ref 0.0–0.2)

## 2022-11-05 LAB — BASIC METABOLIC PANEL
Anion gap: 11 (ref 5–15)
BUN: 8 mg/dL (ref 8–23)
CO2: 28 mmol/L (ref 22–32)
Calcium: 9.8 mg/dL (ref 8.9–10.3)
Chloride: 100 mmol/L (ref 98–111)
Creatinine, Ser: 0.75 mg/dL (ref 0.44–1.00)
GFR, Estimated: >60 mL/min (ref >60)
Glucose, Bld: 132 mg/dL — ABNORMAL HIGH (ref 70–99)
Potassium: 3.5 mmol/L (ref 3.5–5.1)
Sodium: 139 mmol/L (ref 135–145)

## 2022-11-07 ENCOUNTER — Ambulatory Visit: Payer: BC Managed Care – PPO | Admitting: Certified Registered"

## 2022-11-07 ENCOUNTER — Encounter: Payer: Self-pay | Admitting: Surgery

## 2022-11-07 ENCOUNTER — Other Ambulatory Visit: Payer: Self-pay

## 2022-11-07 ENCOUNTER — Encounter
Admission: RE | Disposition: A | Discharge status: Home / Self Care | Payer: Self-pay | Source: Home / Self Care | Attending: Surgery

## 2022-11-07 ENCOUNTER — Ambulatory Visit
Admission: RE | Admit: 2022-11-07 | Discharge: 2022-11-07 | Disposition: A | Discharge status: Home / Self Care | Payer: BC Managed Care – PPO | Attending: Surgery | Admitting: Surgery

## 2022-11-07 DIAGNOSIS — Z7985 Long-term (current) use of injectable non-insulin antidiabetic drugs: Secondary | ICD-10-CM | POA: Diagnosis not present

## 2022-11-07 DIAGNOSIS — E1140 Type 2 diabetes mellitus with diabetic neuropathy, unspecified: Secondary | ICD-10-CM | POA: Diagnosis not present

## 2022-11-07 DIAGNOSIS — I700 Atherosclerosis of aorta: Secondary | ICD-10-CM | POA: Diagnosis not present

## 2022-11-07 DIAGNOSIS — Z7984 Long term (current) use of oral hypoglycemic drugs: Secondary | ICD-10-CM | POA: Insufficient documentation

## 2022-11-07 DIAGNOSIS — Z79899 Other long term (current) drug therapy: Secondary | ICD-10-CM | POA: Insufficient documentation

## 2022-11-07 DIAGNOSIS — Z833 Family history of diabetes mellitus: Secondary | ICD-10-CM | POA: Insufficient documentation

## 2022-11-07 DIAGNOSIS — G5601 Carpal tunnel syndrome, right upper limb: Secondary | ICD-10-CM | POA: Diagnosis not present

## 2022-11-07 DIAGNOSIS — Z7982 Long term (current) use of aspirin: Secondary | ICD-10-CM | POA: Diagnosis not present

## 2022-11-07 DIAGNOSIS — E785 Hyperlipidemia, unspecified: Secondary | ICD-10-CM | POA: Diagnosis not present

## 2022-11-07 DIAGNOSIS — G8918 Other acute postprocedural pain: Secondary | ICD-10-CM

## 2022-11-07 DIAGNOSIS — K219 Gastro-esophageal reflux disease without esophagitis: Secondary | ICD-10-CM | POA: Diagnosis not present

## 2022-11-07 DIAGNOSIS — M1990 Unspecified osteoarthritis, unspecified site: Secondary | ICD-10-CM | POA: Insufficient documentation

## 2022-11-07 DIAGNOSIS — I10 Essential (primary) hypertension: Secondary | ICD-10-CM | POA: Diagnosis not present

## 2022-11-07 DIAGNOSIS — G4733 Obstructive sleep apnea (adult) (pediatric): Secondary | ICD-10-CM | POA: Diagnosis not present

## 2022-11-07 HISTORY — PX: CARPAL TUNNEL RELEASE: SHX101

## 2022-11-07 LAB — GLUCOSE, CAPILLARY
Glucose-Capillary: 139 mg/dL — ABNORMAL HIGH (ref 70–99)
Glucose-Capillary: 111 mg/dL — ABNORMAL HIGH (ref 70–99)

## 2022-11-07 SURGERY — RELEASE, CARPAL TUNNEL, ENDOSCOPIC
Anesthesia: General | Site: Wrist | Laterality: Right

## 2022-11-07 MED ORDER — PHENYLEPHRINE 80 MCG/ML (10ML) SYRINGE FOR IV PUSH (FOR BLOOD PRESSURE SUPPORT)
PREFILLED_SYRINGE | INTRAVENOUS | Status: AC
  Filled 2022-11-07: qty 10

## 2022-11-07 MED ORDER — PHENYLEPHRINE HCL (PRESSORS) 10 MG/ML IV SOLN
INTRAVENOUS | Status: DC | PRN
  Administered 2022-11-07: 80 ug via INTRAVENOUS

## 2022-11-07 MED ORDER — ACETAMINOPHEN 10 MG/ML IV SOLN
INTRAVENOUS | Status: DC | PRN
  Administered 2022-11-07: 1000 mg via INTRAVENOUS

## 2022-11-07 MED ORDER — METOCLOPRAMIDE HCL 10 MG PO TABS: 5.0000 mg | ORAL_TABLET | Freq: Three times a day (TID) | ORAL | Status: DC | PRN

## 2022-11-07 MED ORDER — KETOROLAC TROMETHAMINE 15 MG/ML IJ SOLN
INTRAMUSCULAR | Status: AC
  Filled 2022-11-07: qty 1

## 2022-11-07 MED ORDER — FENTANYL CITRATE (PF) 100 MCG/2ML IJ SOLN
INTRAMUSCULAR | Status: DC | PRN
  Administered 2022-11-07: 25 ug via INTRAVENOUS

## 2022-11-07 MED ORDER — DEXAMETHASONE SODIUM PHOSPHATE 10 MG/ML IJ SOLN
INTRAMUSCULAR | Status: DC | PRN
  Administered 2022-11-07: 5 mg via INTRAVENOUS

## 2022-11-07 MED ORDER — ONDANSETRON HCL 4 MG PO TABS: 4.0000 mg | ORAL_TABLET | Freq: Four times a day (QID) | ORAL | Status: DC | PRN

## 2022-11-07 MED ORDER — BUPIVACAINE HCL (PF) 0.5 % IJ SOLN
INTRAMUSCULAR | Status: AC
  Filled 2022-11-07: qty 30

## 2022-11-07 MED ORDER — LIDOCAINE HCL (CARDIAC) PF 100 MG/5ML IV SOSY
PREFILLED_SYRINGE | INTRAVENOUS | Status: DC | PRN
  Administered 2022-11-07: 60 mg via INTRAVENOUS

## 2022-11-07 MED ORDER — ACETAMINOPHEN 10 MG/ML IV SOLN
INTRAVENOUS | Status: AC
  Filled 2022-11-07: qty 100

## 2022-11-07 MED ORDER — CHLORHEXIDINE GLUCONATE 0.12 % MT SOLN
15.0000 mL | Freq: Once | OROMUCOSAL | Status: AC
  Administered 2022-11-07: 15 mL via OROMUCOSAL

## 2022-11-07 MED ORDER — FENTANYL CITRATE (PF) 100 MCG/2ML IJ SOLN
INTRAMUSCULAR | Status: AC
  Filled 2022-11-07: qty 2

## 2022-11-07 MED ORDER — PROPOFOL 10 MG/ML IV BOLUS
INTRAVENOUS | Status: DC | PRN
Start: 2022-11-07 — End: 2022-11-07
  Administered 2022-11-07: 200 mg via INTRAVENOUS

## 2022-11-07 MED ORDER — ACETAMINOPHEN 325 MG PO TABS: 325.0000 mg | ORAL_TABLET | Freq: Four times a day (QID) | ORAL | Status: DC | PRN

## 2022-11-07 MED ORDER — SODIUM CHLORIDE 0.9 % IV SOLN: INTRAVENOUS | Status: DC

## 2022-11-07 MED ORDER — ONDANSETRON HCL 4 MG/2ML IJ SOLN
INTRAMUSCULAR | Status: DC | PRN
  Administered 2022-11-07 (×2): 4 mg via INTRAVENOUS

## 2022-11-07 MED ORDER — HYDROCODONE-ACETAMINOPHEN 5-325 MG PO TABS
1.0000 | ORAL_TABLET | Freq: Three times a day (TID) | ORAL | 0 refills | Status: AC | PRN
Start: 2022-11-07 — End: 2022-11-14

## 2022-11-07 MED ORDER — HYDROCODONE-ACETAMINOPHEN 5-325 MG PO TABS: 1.0000 | ORAL_TABLET | ORAL | Status: DC | PRN

## 2022-11-07 MED ORDER — BUPIVACAINE HCL (PF) 0.5 % IJ SOLN
INTRAMUSCULAR | Status: DC | PRN
  Administered 2022-11-07: 10 mL

## 2022-11-07 MED ORDER — CEFAZOLIN SODIUM-DEXTROSE 2-4 GM/100ML-% IV SOLN
INTRAVENOUS | Status: AC
  Filled 2022-11-07: qty 100

## 2022-11-07 MED ORDER — FENTANYL CITRATE (PF) 100 MCG/2ML IJ SOLN: 25.0000 ug | INTRAMUSCULAR | Status: DC | PRN

## 2022-11-07 MED ORDER — ONDANSETRON HCL 4 MG/2ML IJ SOLN: 4.0000 mg | Freq: Four times a day (QID) | INTRAMUSCULAR | Status: DC | PRN

## 2022-11-07 MED ORDER — KETOROLAC TROMETHAMINE 15 MG/ML IJ SOLN
15.0000 mg | Freq: Once | INTRAMUSCULAR | Status: AC
  Administered 2022-11-07: 15 mg via INTRAVENOUS

## 2022-11-07 MED ORDER — METOCLOPRAMIDE HCL 5 MG/ML IJ SOLN: 5.0000 mg | Freq: Three times a day (TID) | INTRAMUSCULAR | Status: DC | PRN

## 2022-11-07 MED ORDER — GLYCOPYRROLATE 0.2 MG/ML IJ SOLN
INTRAMUSCULAR | Status: DC | PRN
  Administered 2022-11-07: .2 mg via INTRAVENOUS

## 2022-11-07 MED ORDER — MIDAZOLAM HCL 2 MG/2ML IJ SOLN
INTRAMUSCULAR | Status: DC | PRN
  Administered 2022-11-07: 2 mg via INTRAVENOUS

## 2022-11-07 MED ORDER — CEFAZOLIN SODIUM-DEXTROSE 2-4 GM/100ML-% IV SOLN
2.0000 g | INTRAVENOUS | Status: AC
  Administered 2022-11-07: 2 g via INTRAVENOUS

## 2022-11-07 MED ORDER — ORAL CARE MOUTH RINSE: 15.0000 mL | Freq: Once | OROMUCOSAL | Status: AC

## 2022-11-07 MED ORDER — CHLORHEXIDINE GLUCONATE 0.12 % MT SOLN
OROMUCOSAL | Status: AC
  Filled 2022-11-07: qty 15

## 2022-11-07 MED ORDER — DROPERIDOL 2.5 MG/ML IJ SOLN: 0.6250 mg | Freq: Once | INTRAMUSCULAR | Status: DC | PRN

## 2022-11-07 MED ORDER — MIDAZOLAM HCL 2 MG/2ML IJ SOLN
INTRAMUSCULAR | Status: AC
  Filled 2022-11-07: qty 2

## 2022-11-07 MED ORDER — 0.9 % SODIUM CHLORIDE (POUR BTL) OPTIME
TOPICAL | Status: DC | PRN
  Administered 2022-11-07: 500 mL

## 2022-11-07 SURGICAL SUPPLY — 36 items
APL PRP STRL LF DISP 70% ISPRP (MISCELLANEOUS) ×1
BNDG CMPR 5X2 KNTD ELC UNQ LF (GAUZE/BANDAGES/DRESSINGS) ×1
BNDG CMPR 5X4 CHSV STRCH STRL (GAUZE/BANDAGES/DRESSINGS) ×1
BNDG COHESIVE 4X5 TAN STRL LF (GAUZE/BANDAGES/DRESSINGS) ×1 IMPLANT
BNDG ELASTIC 2INX 5YD STR LF (GAUZE/BANDAGES/DRESSINGS) ×1 IMPLANT
BNDG ESMARCH 4 X 12 STRL LF (GAUZE/BANDAGES/DRESSINGS) ×1
BNDG ESMARCH 4X12 STRL LF (GAUZE/BANDAGES/DRESSINGS) ×1 IMPLANT
CHLORAPREP W/TINT 26 (MISCELLANEOUS) ×1 IMPLANT
CORD BIP STRL DISP 12FT (MISCELLANEOUS) ×1 IMPLANT
CUFF TOURN SGL QUICK 18X4 (TOURNIQUET CUFF) ×1 IMPLANT
DRAPE SURG 17X11 SM STRL (DRAPES) ×1 IMPLANT
FORCEPS JEWEL BIP 4-3/4 STR (INSTRUMENTS) ×1 IMPLANT
GAUZE SPONGE 4X4 12PLY STRL (GAUZE/BANDAGES/DRESSINGS) ×1 IMPLANT
GAUZE XEROFORM 1X8 LF (GAUZE/BANDAGES/DRESSINGS) ×1 IMPLANT
GLOVE BIO SURGEON STRL SZ8 (GLOVE) ×1 IMPLANT
GLOVE INDICATOR 8.0 STRL GRN (GLOVE) ×1 IMPLANT
GOWN STRL REUS W/ TWL LRG LVL3 (GOWN DISPOSABLE) ×1 IMPLANT
GOWN STRL REUS W/ TWL XL LVL3 (GOWN DISPOSABLE) ×1 IMPLANT
GOWN STRL REUS W/TWL LRG LVL3 (GOWN DISPOSABLE) ×1
GOWN STRL REUS W/TWL XL LVL3 (GOWN DISPOSABLE) ×1
KIT CARPAL TUNNEL (MISCELLANEOUS) ×1
KIT ESCP INSRT D SLOT CANN KN (MISCELLANEOUS) ×1 IMPLANT
KIT TURNOVER KIT A (KITS) ×1 IMPLANT
MANIFOLD NEPTUNE II (INSTRUMENTS) ×1 IMPLANT
NS IRRIG 500ML POUR BTL (IV SOLUTION) ×1 IMPLANT
PACK EXTREMITY ARMC (MISCELLANEOUS) ×1 IMPLANT
SPLINT WRIST LG LT TX990309 (SOFTGOODS) IMPLANT
SPLINT WRIST LG RT TX900304 (SOFTGOODS) IMPLANT
SPLINT WRIST M LT TX990308 (SOFTGOODS) IMPLANT
SPLINT WRIST M RT TX990303 (SOFTGOODS) IMPLANT
SPLINT WRIST XL LT TX990310 (SOFTGOODS) IMPLANT
SPLINT WRIST XL RT TX990305 (SOFTGOODS) IMPLANT
STOCKINETTE IMPERVIOUS 9X36 MD (GAUZE/BANDAGES/DRESSINGS) ×1 IMPLANT
SUT PROLENE 4 0 PS 2 18 (SUTURE) ×1 IMPLANT
TRAP FLUID SMOKE EVACUATOR (MISCELLANEOUS) ×1 IMPLANT
WATER STERILE IRR 500ML POUR (IV SOLUTION) ×1 IMPLANT

## 2022-11-07 NOTE — Discharge Instructions (Addendum)
Orthopedic discharge instructions: Keep dressing dry and intact. Keep hand elevated above heart level. May shower after dressing removed on postop day 4 (Monday). Cover sutures with Band-Aid after drying off then reapply Velcro splint. Apply ice to affected area frequently. Take ibuprofen 600-800 mg TID with meals for 3-5 days, then as necessary. Take ES Tylenol or pain medication as prescribed when needed.  Return for follow-up in 10-14 days or as scheduled.  AMBULATORY SURGERY  DISCHARGE INSTRUCTIONS   The drugs that you were given will stay in your system until tomorrow so for the next 24 hours you should not:  Drive an automobile Make any legal decisions Drink any alcoholic beverage   You may resume regular meals tomorrow.  Today it is better to start with liquids and gradually work up to solid foods.  You may eat anything you prefer, but it is better to start with liquids, then soup and crackers, and gradually work up to solid foods.   Please notify your doctor immediately if you have any unusual bleeding, trouble breathing, redness and pain at the surgery site, drainage, fever, or pain not relieved by medication.   Please contact your physician with any problems or Same Day Surgery at [phone], Monday through Friday 6 am to 4 pm, or [place] at [facility] number at [phone].

## 2022-11-07 NOTE — H&P (Signed)
History of Present Illness:  Lisa Galvan is a 62 y.o. female who presents for evaluation and treatment of her bilateral hand and wrist pain and paresthesias. She notes that symptoms have been present for more than a year and developed without any specific cause or injury. She sees Dr. Sherryll Burger for a diagnosis of sensory polyneuropathy. He performed an EMG on both upper extremities in April, 2023, which demonstrated the presence of "a generalized sensory polyneuropathy, superimposed bilateral carpal tunnel syndrome." She has been treated with a steroid injection as well as with occupational therapy. Despite these modalities, she continues to experience moderate pain and numbness to her fingers which she rates at 7/10 on today's visit. Her symptoms are worse with repetitive activities, as well as at night. She has been wrapping her fingers which she finds to be of some benefit, and taking gabapentin, also with temporary partial relief of her symptoms. She notes that the injection she received last September by Dr. Sherryll Burger did help but did not last. She notes that all fingers in both hands are affected and that her hands are affected equally. She is right-hand dominant.   Current Outpatient Medications:  aspirin 81 MG EC tablet Take 81 mg by mouth once daily  atorvastatin (LIPITOR) 20 MG tablet Take 20 mg by mouth once daily.  cholecalciferol, vitamin D3, (VITAMIN D3) 125 mcg (5,000 unit) tablet Take 5,000 Units by mouth once daily  cyanocobalamin (VITAMIN B12) 1000 MCG tablet Take 1,000 mcg by mouth 2 (two) times daily  DULoxetine (CYMBALTA) 30 MG DR capsule Take 3 capsules (90 mg total) by mouth once daily for 360 days Take one nightly along with 60 mg 270 capsule 3  ELIDEL 1 % cream Apply topically 2 (two) times daily 1  EPINEPHrine (EPIPEN) 0.3 mg/0.3 mL auto-injector  fexofenadine (ALLEGRA) 180 MG tablet Take 180 mg by mouth once daily  gabapentin (NEURONTIN) 100 MG capsule 300 mg by mouth every morning + 300  mg by mouth every afternoon + 400 mg by mouth every night. 90 capsule 3  gabapentin (NEURONTIN) 300 MG capsule 300 mg by mouth every morning + 300 mg by mouth every afternoon + 400 mg by mouth every night. 90 capsule 3  ketoconazole (NIZORAL) 2 % cream APPLY TOPICALLY TO THE AFFECTED AREA DAILY  MAG-G 27 mg magnesium (500 mg) tablet  magnesium oxide 500 mg Tab Take 500 mg by mouth once daily  meloxicam (MOBIC) 7.5 MG tablet  metFORMIN (GLUCOPHAGE) 500 MG tablet Take 500 mg by mouth 2 (two) times daily  metoprolol succinate (TOPROL-XL) 25 MG XL tablet Take 1 tablet by mouth once daily  montelukast (SINGULAIR) 10 mg tablet Take by mouth Take 1 tablet (10 mg total) by mouth at bedtime.  omeprazole (PRILOSEC) 40 MG DR capsule Take by mouth 2 (two) times daily before meals Take 1 capsule (40 mg total) by mouth daily before breakfast.  OZEMPIC 0.25 mg or 0.5 mg (2 mg/3 mL) pen injector Inject subcutaneously  potassium chloride (KLOR-CON) 10 mEq ER tablet Take 10 mEq by mouth 3 (three) times daily  sennosides-docusate (SENOKOT-S) 8.6-50 mg tablet Take by mouth  sucralfate (CARAFATE) 1 gram tablet Take 1 g by mouth as needed  valACYclovir (VALTREX) 500 MG tablet Take 500 mg by mouth once daily as needed (fever blisters)  walker Misc Use 1 each once daily 1 each 0  metoprolol tartrate (LOPRESSOR) 25 MG tablet Take 25 mg by mouth 2 (two) times daily  oxyCODONE-acetaminophen (PERCOCET) 5-325 mg tablet Take  by mouth (Patient not taking: Reported on 10/12/2022)   Allergies:  Amlodipine-Benazepril Anaphylaxis  Dulaglutide Swelling  Face and mouth swelled up  Iodinated Contrast Media Anaphylaxis  Lidocaine Rash (02-01-2021: Patient had lidocaine patch applied to chest at sight of injury and she experienced a rash, whelping, and swelling. Resolved after removing the patch. Likely to be the adhesive as she can tolerate injectable lidocaine w/o problems)  Other Anaphylaxis  Pregabalin Swelling  Face/ mouth  swelled up balloon  Shellfish Containing Products Anaphylaxis  Ace Inhibitors Swelling   Past Medical History:  DDD (degenerative disc disease), lumbar 12/16/2014  Diabetes mellitus type 2, uncomplicated (CMS/HHS-HCC)  GERD (gastroesophageal reflux disease)  Hyperlipidemia  Hypertension  Neuropathy  OSA (obstructive sleep apnea) 05/23/2020   Past Surgical History:  UMBILICAL HERNIA REPAIR 05/01/2017  ORIF left ankle (bimalleolar ankle fracture with fixation of lateral and medial malleoli) Left 07/30/2019 (Dr. Allena Katz)  Robotic assisted laparoscopic ventral hernia repair IPOM using Round 11.4 cms ventralight BARD mesh 10/06/2019 (Dr. Everlene Farrier)  ABDOMINAL HYSTERECTOMY   Family History  Problem Relation Name Age of Onset  Diabetes Mother Rosetta Speller  Cancer Mother Rosetta Speller  Heart disease Father Marcy Siren  Coronary Artery Disease (Blocked arteries around heart) Father Marcy Siren  Heart failure Father Marcy Siren  Diabetes Father Marcy Siren  Kidney failure Father Marcy Siren  Cancer Father Marcy Siren  Myocardial Infarction (Heart attack) Father Marcy Siren  High blood pressure (Hypertension) Father Marcy Siren  High blood pressure (Hypertension) Daughter Dorathy Daft [place]  High blood pressure (Hypertension) Daughter [name]  Myasthenia gravis Brother Sandrea Hughs   Social History:   Socioeconomic History:  Marital status: Widowed  Tobacco Use  Smoking status: Never  Smokeless tobacco: Never  Vaping Use  Vaping status: Never Used  Substance and Sexual Activity  Alcohol use: Not Currently  Alcohol/week: 4.0 standard drinks of alcohol  Types: 4 Glasses of wine per week  Drug use: Never  Sexual activity: Not Currently   Social Determinants of Health:   Financial Resource Strain: High Risk (01/02/2022)  Received from [facility], [place]  Overall Financial Resource Strain (CARDIA)  Difficulty of Paying Living Expenses: Hard  Food Insecurity:  No Food Insecurity (05/04/2022)  Received from [facility], [place]  Hunger Vital Sign  Worried About Running Out of Food in the Last Year: Never true  Ran Out of Food in the Last Year: Never true  Transportation Needs: No Transportation Needs (05/04/2022)  Received from [facility], [place]  PRAPARE - Transportation  Lack of Transportation (Medical): No  Lack of Transportation (Non-Medical): No   Review of Systems:  A comprehensive 14 point ROS was performed, reviewed, and the pertinent orthopaedic findings are documented in the HPI.  Physical Exam: Vitals:  10/12/22 0848  BP: 116/84  Weight: 74.6 kg (164 lb 6.4 oz)  Height: 160 cm (5\' 3" )  PainSc: 7  PainLoc: Hand   General/Constitutional: The patient appears to be well-nourished, well-developed, and in no acute distress. Neuro/Psych: Normal mood and affect, oriented to person, place and time. Eyes: Non-icteric. Pupils are equal, round, and reactive to light, and exhibit synchronous movement. ENT: Unremarkable. Lymphatic: No palpable adenopathy. Respiratory: Lungs clear to auscultation, Normal chest excursion, No wheezes, and Non-labored breathing Cardiovascular: Regular rate and rhythm. No murmurs. and No edema, swelling or tenderness, except as noted in detailed exam. Integumentary: No impressive skin lesions present, except as noted in detailed exam. Musculoskeletal: Unremarkable, except as noted in detailed exam.  Right  wrist/hand exam: Skin inspection of the right wrist and hand is unremarkable. No swelling, erythema, ecchymosis, abrasions, or other skin abnormalities are identified. There is no tenderness to palpation over the dorsal or volar aspects of the wrist, nor does she have any tenderness to palpation over the dorsal or palmar aspects of the hand. She exhibits full active and passive range of motion of the wrist without any pain or catching. She also is able to actively flex and extend all digits fully without  any pain or triggering. She is neurovascularly intact to all digits. She has a negative Phalen's test and a negative Tinel's over the carpal tunnel.  X-rays/MRI/Lab data:  AP, lateral, and oblique views of the right hand are obtained. These films demonstrate no evidence for fractures, lytic lesions, or significant degenerative changes.  A recent EMG of both upper extremities is available for review and has been reviewed by myself. By report, the study demonstrates evidence of bilateral carpal tunnel syndrome superimposed on underlying sensory polyneuropathy. This report was reviewed by myself and discussed with the patient.  Assessment: Carpal tunnel syndrome, right.  Plan: The treatment options were discussed with the patient. In addition, patient educational materials were provided regarding the diagnosis and treatment options. The patient is quite frustrated by her symptoms and function limitations, and is ready to consider more aggressive treatment options, especially as a pertains to her right wrist and hand. Therefore, I have recommended a surgical procedure, specifically an endoscopic right carpal tunnel release. The procedure was discussed with the patient, as were the potential risks (including bleeding, infection, nerve and/or blood vessel injury, persistent or recurrent pain/paresthesias, weakness of grip, need for further surgery, blood clots, strokes, heart attacks and/or arhythmias, pneumonia, etc.) and benefits. The patient states her understanding and wishes to proceed. All of the patient's questions and concerns were answered. She can call any time with further concerns. She will follow up post-surgery, routine.    H&P reviewed and patient re-examined. No changes.

## 2022-11-07 NOTE — Transfer of Care (Signed)
Immediate Anesthesia Transfer of Care Note  Patient: Lisa Galvan  Procedure(s) Performed: CARPAL TUNNEL RELEASE ENDOSCOPIC (Right: Wrist)  Patient Location: PACU  Anesthesia Type:General  Level of Consciousness: drowsy and patient cooperative  Airway & Oxygen Therapy: Patient Spontanous Breathing and Patient connected to face mask oxygen  Post-op Assessment: Report given to RN and Post -op Vital signs reviewed and stable  Post vital signs: Reviewed and stable  Last Vitals:  Vitals Value Taken Time  BP 136/82 11/07/22 0930  Temp    Pulse 87 11/07/22 0931  Resp 18 11/07/22 0931  SpO2 100 % 11/07/22 0931  Vitals shown include unfiled device data.  Last Pain:  Vitals:   11/07/22 0730  TempSrc: Temporal  PainSc: 0-No pain         Complications: No notable events documented.

## 2022-11-07 NOTE — Anesthesia Preprocedure Evaluation (Signed)
Anesthesia Evaluation  Patient identified by MRN, date of birth, ID band Patient awake    Reviewed: Allergy & Precautions, NPO status , Patient's Chart, lab work & pertinent test results  History of Anesthesia Complications Negative for: history of anesthetic complications  Airway Mallampati: IV   Neck ROM: Full    Dental  (+) Dental Advidsory Given, Chipped, Teeth Intact   Pulmonary neg shortness of breath, sleep apnea , neg COPD, neg recent URI   Pulmonary exam normal breath sounds clear to auscultation       Cardiovascular hypertension, (-) angina (-) Past MI and (-) Cardiac Stents Normal cardiovascular exam(-) dysrhythmias (-) Valvular Problems/Murmurs Rhythm:Regular Rate:Normal  ECG 12/11/21:  Unusual P axis, possible ectopic atrial tachycardia Nonspecific T wave abnormality  Myocardial perfusion 11/18/20: Normal myocardial perfusion without evidence of myocardial ischemia  Echo 11/18/20:  NORMAL LEFT VENTRICULAR SYSTOLIC FUNCTION   WITH MODERATE LVH  NORMAL RIGHT VENTRICULAR SYSTOLIC FUNCTION  TRIVIAL REGURGITATION NOTED  NO VALVULAR STENOSIS  ESTIMATED LVEF >55%  Aortic: TRIVIAL AI  Mitral: TRIVIAL MR  Tricuspid: TRIVIAL TR  Pulmonic: TRIVIAL P    Neuro/Psych neg Seizures PSYCHIATRIC DISORDERS  Depression     Neuromuscular disease (neuropathy)    GI/Hepatic ,GERD  ,,  Endo/Other  diabetes, Type 2    Renal/GU negative Renal ROS     Musculoskeletal  (+) Arthritis ,    Abdominal   Peds  Hematology  (+) Blood dyscrasia, anemia   Anesthesia Other Findings Note: lidocaine allergy was a rash to lidocaine patch; tolerated IV lidocaine for last surgery   Cardiology note 12/13/21:  62 y.o. female with  Encounter Diagnoses  Name Primary?  Benign essential HTN Yes  Aortic atherosclerosis (CMS-HCC)  LVH (left ventricular hypertrophy) due to hypertensive disease, without heart failure  Mixed hyperlipidemia   OSA (obstructive sleep apnea)  SOBOE (shortness of breath on exertion)  Type 2 diabetes mellitus with diabetic neuropathy, without long-term current use of insulin (CMS-HCC)   Plan  -No addition of medications at this time for essential hypertension. We have discussed the need for continued monitoring in the future.  -We have had a long discussion about the risk factors, causes, treatments, and manifestations of left ventricular hypertrophy. The long-term plan is to use medications, diet, exercise counseling, and risk factor modification to reduce future symptoms, manifestations, and the potential for congestive heart failure. -We have discussed risk reduction in the cardiovascular disease process by a continuation of lipid management with the current medication management for lipid reduction. The goals continue to be 30-50% lowering of LDL cholesterol in addition to lifestyle measures. This will include diet and improved activity level on a regular basis.The patient has an understanding of this discussion at this time and we will continue the appropriate strategy. -We have had a discussion of the benefits of CPAP machine use as well as its possible frustrations. The patient is encouraged to continue diligent use CPAP machine for reduction of sleep apnea signs and symptoms which may include shortness of breath, hypertension, and lower extremity edema. There also will be close evaluation of the equipment and future symptom resolution on a regular basis to provide the best outcomes of treatment. -No further intervention shortness of breath reported above multifactorial in nature including age, decreased exercise tolerance, disability, and/or medication management. The patient is to follow for any worsening symptoms or increase in severity for further in need in investigation or treatment options.  -Continue aggressive medical management of diabetes following the ABCs for  prevention of cardiovascular  disease and complications. The goals set forth include a goal HbA1c of less than 7, moderate to high intensity statin use if patient can tolerate, and a goal systolic blood pressure of below .  -The patient has been instructed to adhere to improving healthy lifestyle. We have specifically addressed the most important factors including abstinence of tobacco use, regular physical activity including the possibility of exercise prescription, healthy diet, and maintaining an appropriate body mass index.  Orders Placed This Encounter  Procedures  ORDER   Return if symptoms worsen or fail to improve.    Reproductive/Obstetrics                             Anesthesia Physical Anesthesia Plan  ASA: 2  Anesthesia Plan: General   Post-op Pain Management:    Induction: Intravenous  PONV Risk Score and Plan: 3 and Ondansetron, Dexamethasone, Treatment may vary due to age or medical condition and Midazolam  Airway Management Planned: LMA  Additional Equipment:   Intra-op Plan:   Post-operative Plan: Extubation in OR  Informed Consent: I have reviewed the patients History and Physical, chart, labs and discussed the procedure including the risks, benefits and alternatives for the proposed anesthesia with the patient or authorized representative who has indicated his/her understanding and acceptance.       Plan Discussed with: CRNA  Anesthesia Plan Comments:         Anesthesia Quick Evaluation

## 2022-11-07 NOTE — Anesthesia Procedure Notes (Signed)
Procedure Name: LMA Insertion Date/Time: 11/07/2022 8:54 AM  Performed by: Mohammed Kindle, CRNAPre-anesthesia Checklist: Patient identified, Patient being monitored, Timeout performed, Emergency Drugs available and Suction available Patient Re-evaluated:Patient Re-evaluated prior to induction Oxygen Delivery Method: Circle system utilized Preoxygenation: Pre-oxygenation with 100% oxygen Induction Type: IV induction Ventilation: Mask ventilation without difficulty LMA: LMA inserted LMA Size: 4.0 Tube type: Oral Number of attempts: 1 Placement Confirmation: positive ETCO2 and breath sounds checked- equal and bilateral Tube secured with: Tape Dental Injury: Teeth and Oropharynx as per pre-operative assessment

## 2022-11-07 NOTE — Op Note (Signed)
11/07/2022  9:35 AM  Patient:   Lisa Galvan  Pre-Op Diagnosis:   Right carpal tunnel syndrome.  Post-Op Diagnosis:   Same.  Procedure:   Endoscopic right carpal tunnel release.  Surgeon:   Maryagnes Amos, MD  Anesthesia:   General LMA  Findings:   As above.  Complications:   None  EBL:   0 cc  Fluids:   200 cc crystalloid  TT:   10 minutes at 250 mmHg  Drains:   None  Closure:   4-0 Prolene interrupted sutures  Brief Clinical Note:   The patient is a 61 year old female with a long history of progressively worsening pain and paresthesias to her right hand. Her symptoms have progressed despite medications, activity modification, etc. Her history and examination are consistent with carpal tunnel syndrome, confirmed by EMG. The patient presents at this time for an endoscopic right carpal tunnel release.   Procedure:   The patient was brought into the operating room and lain in the supine position. After adequate general laryngeal mask anesthesia was obtained, the right hand and upper extremity were prepped with ChloraPrep solution before being draped sterilely. Preoperative antibiotics were administered. A timeout was performed to verify the appropriate surgical site before the limb was exsanguinated with an Esmarch and the tourniquet inflated to 250 mmHg.   An approximately 1.5-2 cm incision was made over the volar wrist flexion crease, centered over the palmaris longus tendon. The incision was carried down through the subcutaneous tissues with care taken to identify and protect any neurovascular structures. The distal forearm fascia was penetrated just proximal to the transverse carpal ligament. The soft tissues were released off the superficial and deep surfaces of the distal forearm fascia and this was released proximally for 3-4 cm under direct visualization.  Attention was directed distally. The Therapist, nutritional was passed beneath the transverse carpal ligament along the ulnar  aspect of the carpal tunnel and used to release any adhesions as well as to remove any adherent synovial tissue before first the smaller then the larger of the two dilators were passed beneath the transverse carpal ligament along the ulnar margin of the carpal tunnel. The slotted cannula was introduced and the endoscope was placed into the slotted cannula and the undersurface of the transverse carpal ligament visualized. The distal margin of the transverse carpal ligament was marked by placing a 25-gauge needle percutaneously at Kaplan's cardinal point so that it entered the distal portion of the slotted cannula. Under endoscopic visualization, the transverse carpal ligament was released from proximal to distal using the end-cutting blade. A second pass was performed to ensure complete release of the ligament. The adequacy of release was verified both endoscopically and by palpation using the freer elevator.  The wound was irrigated thoroughly with sterile saline solution before being closed using 4-0 Prolene interrupted sutures. A total of 10 cc of 0.5% plain Sensorcaine was injected in and around the incision before a sterile bulky dressing was applied to the wound. The patient was placed into a volar wrist splint before being awakened, extubated, and returned to the recovery room in satisfactory condition after tolerating the procedure well.

## 2022-11-08 ENCOUNTER — Encounter: Payer: Self-pay | Admitting: Surgery

## 2022-11-09 ENCOUNTER — Other Ambulatory Visit: Payer: Self-pay | Admitting: Family Medicine

## 2022-11-09 DIAGNOSIS — M792 Neuralgia and neuritis, unspecified: Secondary | ICD-10-CM

## 2022-11-12 NOTE — Telephone Encounter (Signed)
Unable to refill per protocol, Rx request is too soon. Last refill 10/11/22 for 90 days.  Requested Prescriptions  Pending Prescriptions Disp Refills   DULoxetine (CYMBALTA) 60 MG capsule [Pharmacy Med Name: DULOXETINE DR 60MG  CAPSULES] 90 capsule 0    Sig: TAKE 1 CAPSULE(60 MG TOTAL) BY MOUTH DAILY( TAKE WITH 30 MG FOR TOTAL DOSE OF 90 MG)     Psychiatry: Antidepressants - SNRI - duloxetine Passed - 11/09/2022  6:24 PM      Passed - Cr in normal range and within 360 days    Creatinine, Ser  Date Value Ref Range Status  11/05/2022 0.75 0.44 - 1.00 mg/dL Final         Passed - eGFR is 30 or above and within 360 days    GFR calc Af Amer  Date Value Ref Range Status  02/18/2020 89 >59 mL/min/1.73 Final    Comment:    **In accordance with recommendations from the NKF-ASN Task force,**   [facility] is in the process of updating its eGFR calculation to the   2021 CKD-EPI creatinine equation that estimates kidney function   without a race variable.    GFR, Estimated  Date Value Ref Range Status  11/05/2022 >60 >60 mL/min Final    Comment:    (NOTE) Calculated using the CKD-EPI Creatinine Equation (2021)    eGFR  Date Value Ref Range Status  06/06/2022 76 >59 mL/min/1.73 Final         Passed - Completed PHQ-2 or PHQ-9 in the last 360 days      Passed - Last BP in normal range    BP Readings from Last 1 Encounters:  11/07/22 120/82         Passed - Valid encounter within last 6 months    Recent Outpatient Visits           2 months ago Type 2 diabetes mellitus with diabetic neuropathy, without long-term current use of insulin (HCC)   [place] [facility] Patterson, Megan P, DO   5 months ago Routine general medical examination at a health care facility   [facility] [place], [place] P, DO   6 months ago Appointment canceled by hospital   [place] [facility] Mecum, Oswaldo Conroy, PA-C   6 months ago Trimalleolar fracture of right  ankle, closed, with nonunion, subsequent encounter   [place] [facility] Mecum, Oswaldo Conroy, PA-C   7 months ago COVID-19   [employer], Oswaldo Conroy, PA-C       Future Appointments             In 3 weeks La[place] Benes, Oralia Rud, DO [employer] [employer], [facility]

## 2022-11-14 NOTE — Anesthesia Postprocedure Evaluation (Signed)
Anesthesia Post Note  Patient: Lisa Galvan  Procedure(s) Performed: CARPAL TUNNEL RELEASE ENDOSCOPIC (Right: Wrist)  Patient location during evaluation: PACU Anesthesia Type: General Level of consciousness: awake and alert Pain management: pain level controlled Vital Signs Assessment: post-procedure vital signs reviewed and stable Respiratory status: spontaneous breathing, nonlabored ventilation, respiratory function stable and patient connected to nasal cannula oxygen Cardiovascular status: blood pressure returned to baseline and stable Postop Assessment: no apparent nausea or vomiting Anesthetic complications: no   No notable events documented.   Last Vitals:  Vitals:   11/07/22 1000 11/07/22 1020  BP: 121/82 120/82  Pulse: 91 92  Resp: 14 16  Temp: 36.6 C 36.6 C  SpO2: 95% 96%    Last Pain:  Vitals:   11/07/22 1020  TempSrc: Temporal  PainSc: 0-No pain                 Lenard Simmer

## 2022-11-16 DIAGNOSIS — M6281 Muscle weakness (generalized): Secondary | ICD-10-CM | POA: Diagnosis not present

## 2022-11-20 ENCOUNTER — Ambulatory Visit: Payer: Self-pay | Admitting: *Deleted

## 2022-11-20 NOTE — Patient Outreach (Signed)
  Care Coordination   Follow Up Visit Note   11/21/2022 Name: Lisa Galvan MRN: [id number] DOB: Feb 12, 1961  Lisa Galvan is a 62 y.o. year old female who sees Lisa Carrow, DO for primary care. I spoke with  Lisa Galvan by phone today.  What matters to the patients health and wellness today?  Recover from recent surgeries (ankle and carpal tunnel)    Goals Addressed             This Visit's Progress    Effective management of chronic health conditions       Interventions Today    Flowsheet Row Most Recent Value  Chronic Disease   Chronic disease during today's visit Other  [ankle surgery and carpal tunnel surgery]  General Interventions   General Interventions Discussed/Reviewed General Interventions Reviewed, Doctor Visits  Doctor Visits Discussed/Reviewed Doctor Visits Reviewed, PCP, Specialist  [Ortho tomorrow, will have stitches removed from hand. using hand brace.  PCP on 8/14, pain clinic and podiatry 8/20, and ortho follow up 8/28]  PCP/Specialist Visits Compliance with follow-up visit  Education Interventions   Education Provided Provided Education  Provided Verbal Education On When to see the doctor, Other  [No current PT, state she was not able to afford it.  Feels she has gotten stronger, will continue to use DME for stability]           COMPLETED: RNCM: Effective Management of Fatique   On track    Care Coordination Interventions: Evaluation of current treatment plan related to fatigue and being tired, also swallowing difficulty and patient's adherence to plan as established by provider Advised patient to keep appointment with radiology for swallowing evaluation Reviewed medications with patient and discussed compliance Discussed plans with patient for ongoing care management follow up and provided patient with direct contact information for care management team    7/30 - Fatigue no longer an issue     COMPLETED: RNCM: Fall prevention and Safety   On track     Care Coordination Interventions: Provided written and verbal education re: potential causes of falls and Fall prevention strategies.  Reviewed medications and discussed potential side effects of medications such as dizziness and frequent urination Advised patient of importance of notifying provider of falls Assessed for falls since last encounter. Last fall ended in broken ankle Assessed patients knowledge of fall risk prevention secondary to previously provided education. Review with patient to monitor for risk factors and fall risk.  Assessed working status of life alert bracelet and patient adherence Advised patient to discuss fall prevention and safety concerns with provider Patient interviewed about adult health maintenance status including  Falls risk assessment   - had another fall last week, uses rollator and cane  7/30 - no recent falls         SDOH assessments and interventions completed:  No     Care Coordination Interventions:  Yes, provided   Follow up plan: Follow up call scheduled for 9/4    Encounter Outcome:  Pt. Visit Completed   Kemper Durie, RN, MSN, [facility] Care Management Care Management Coordinator [phone]

## 2022-12-05 ENCOUNTER — Encounter: Payer: Self-pay | Admitting: Family Medicine

## 2022-12-05 ENCOUNTER — Ambulatory Visit
Admission: RE | Admit: 2022-12-05 | Discharge: 2022-12-05 | Disposition: A | Discharge status: Home / Self Care | Payer: BC Managed Care – PPO | Source: Ambulatory Visit | Attending: Family Medicine | Admitting: Family Medicine

## 2022-12-05 ENCOUNTER — Ambulatory Visit: Payer: BC Managed Care – PPO | Admitting: Family Medicine

## 2022-12-05 VITALS — BP 122/87 | HR 99 | Temp 98.0°F | Wt 159.8 lb

## 2022-12-05 DIAGNOSIS — Z1231 Encounter for screening mammogram for malignant neoplasm of breast: Secondary | ICD-10-CM | POA: Diagnosis not present

## 2022-12-05 DIAGNOSIS — Z7985 Long-term (current) use of injectable non-insulin antidiabetic drugs: Secondary | ICD-10-CM

## 2022-12-05 DIAGNOSIS — N644 Mastodynia: Secondary | ICD-10-CM

## 2022-12-05 DIAGNOSIS — I10 Essential (primary) hypertension: Secondary | ICD-10-CM

## 2022-12-05 DIAGNOSIS — E785 Hyperlipidemia, unspecified: Secondary | ICD-10-CM

## 2022-12-05 DIAGNOSIS — M792 Neuralgia and neuritis, unspecified: Secondary | ICD-10-CM

## 2022-12-05 DIAGNOSIS — Z7984 Long term (current) use of oral hypoglycemic drugs: Secondary | ICD-10-CM

## 2022-12-05 DIAGNOSIS — E1140 Type 2 diabetes mellitus with diabetic neuropathy, unspecified: Secondary | ICD-10-CM

## 2022-12-05 LAB — BAYER DCA HB A1C WAIVED: HB A1C (BAYER DCA - WAIVED): 6.6 % — ABNORMAL HIGH (ref 4.8–5.6)

## 2022-12-05 MED ORDER — GABAPENTIN 300 MG PO CAPS: 300.0000 mg | ORAL_CAPSULE | Freq: Three times a day (TID) | ORAL | 1 refills | Status: DC

## 2022-12-05 MED ORDER — ATORVASTATIN CALCIUM 20 MG PO TABS
20.0000 mg | ORAL_TABLET | Freq: Every day | ORAL | 1 refills | Status: DC
Start: 2022-12-05 — End: 2023-07-15

## 2022-12-05 MED ORDER — DULOXETINE HCL 60 MG PO CPEP
60.0000 mg | ORAL_CAPSULE | Freq: Every day | ORAL | 1 refills | Status: DC
Start: 2022-12-05 — End: 2023-05-10

## 2022-12-05 MED ORDER — METOPROLOL SUCCINATE ER 25 MG PO TB24: 25.0000 mg | ORAL_TABLET | Freq: Every day | ORAL | 1 refills | Status: DC

## 2022-12-05 MED ORDER — OMEPRAZOLE 40 MG PO CPDR: DELAYED_RELEASE_CAPSULE | ORAL | 1 refills | Status: DC

## 2022-12-05 MED ORDER — METFORMIN HCL 500 MG PO TABS
500.0000 mg | ORAL_TABLET | Freq: Two times a day (BID) | ORAL | 1 refills | Status: DC
Start: 2022-12-05 — End: 2023-03-07

## 2022-12-05 MED ORDER — CLOTRIMAZOLE-BETAMETHASONE 1-0.05 % EX CREA: 1.0000 | TOPICAL_CREAM | Freq: Every day | CUTANEOUS | 0 refills | Status: AC

## 2022-12-05 MED ORDER — OZEMPIC (0.25 OR 0.5 MG/DOSE) 2 MG/3ML ~~LOC~~ SOPN: 0.2500 mg | PEN_INJECTOR | SUBCUTANEOUS | 1 refills | Status: DC

## 2022-12-05 NOTE — Assessment & Plan Note (Signed)
Under good control on current regimen. Continue current regimen. Continue to monitor. Call with any concerns. Refills given. Labs drawn today.   

## 2022-12-05 NOTE — Assessment & Plan Note (Signed)
Would like to get a 2nd opinion with neurology- referral placed today. Await their input.

## 2022-12-05 NOTE — Assessment & Plan Note (Signed)
Doing well with A1c of 6.6. Will keep on current regimen and recheck in 3 months. Call with any concerns.

## 2022-12-05 NOTE — Progress Notes (Signed)
BP 122/87   Pulse 99   Temp 98 F (36.7 C) (Oral)   Wt 159 lb 12.8 oz (72.5 kg)   SpO2 97%   BMI 28.31 kg/m    Subjective:    Patient ID: Lisa Galvan, female    DOB: Jun 30, 1960, 62 y.o.   MRN: [id number]  HPI: Lisa Galvan is a 62 y.o. female  Chief Complaint  Patient presents with   Hypertension   Diabetes   DIABETES Hypoglycemic episodes:no Polydipsia/polyuria: no Visual disturbance: no Chest pain: no Paresthesias: no Glucose Monitoring: yes  Accucheck frequency: Daily Taking Insulin?: no Blood Pressure Monitoring: rarely Retinal Examination: Up to Date Foot Exam: Up to Date Diabetic Education: Completed Pneumovax: Up to Date Influenza: Not up to Date Aspirin: yes  HYPERTENSION / HYPERLIPIDEMIA Satisfied with current treatment? yes Duration of hypertension: chronic BP monitoring frequency: rarely BP medication side effects: no Past BP meds: metoprolol Duration of hyperlipidemia: chronic Cholesterol medication side effects: no Cholesterol supplements: none Past cholesterol medications: atorvastatin Medication compliance: excellent compliance Aspirin: yes Recent stressors: no Recurrent headaches: no Visual changes: no Palpitations: no Dyspnea: no Chest pain: no Lower extremity edema: no Dizzy/lightheaded: no   Relevant past medical, surgical, family and social history reviewed and updated as indicated. Interim medical history since our last visit reviewed. Allergies and medications reviewed and updated.  Review of Systems  Constitutional: Negative.   Respiratory: Negative.    Cardiovascular: Negative.   Gastrointestinal: Negative.   Musculoskeletal: Negative.   Neurological: Negative.   Psychiatric/Behavioral: Negative.      Per HPI unless specifically indicated above     Objective:    BP 122/87   Pulse 99   Temp 98 F (36.7 C) (Oral)   Wt 159 lb 12.8 oz (72.5 kg)   SpO2 97%   BMI 28.31 kg/m   Wt Readings from Last 3 Encounters:   12/05/22 159 lb 12.8 oz (72.5 kg)  11/01/22 159 lb (72.1 kg)  10/30/22 159 lb (72.1 kg)    Physical Exam Vitals and nursing note reviewed.  Constitutional:      General: She is not in acute distress.    Appearance: Normal appearance. She is obese. She is not ill-appearing, toxic-appearing or diaphoretic.  HENT:     Head: Normocephalic and atraumatic.     Right Ear: External ear normal.     Left Ear: External ear normal.     Nose: Nose normal.     Mouth/Throat:     Mouth: Mucous membranes are moist.     Pharynx: Oropharynx is clear.  Eyes:     General: No scleral icterus.       Right eye: No discharge.        Left eye: No discharge.     Extraocular Movements: Extraocular movements intact.     Conjunctiva/sclera: Conjunctivae normal.     Pupils: Pupils are equal, round, and reactive to light.  Cardiovascular:     Rate and Rhythm: Normal rate and regular rhythm.     Pulses: Normal pulses.     Heart sounds: Normal heart sounds. No murmur heard.    No friction rub. No gallop.  Pulmonary:     Effort: Pulmonary effort is normal. No respiratory distress.     Breath sounds: Normal breath sounds. No stridor. No wheezing, rhonchi or rales.  Chest:     Chest wall: No tenderness.  Musculoskeletal:        General: Normal range of motion.     Cervical back:  Normal range of motion and neck supple.  Skin:    General: Skin is warm and dry.     Capillary Refill: Capillary refill takes less than 2 seconds.     Coloration: Skin is not jaundiced or pale.     Findings: No bruising, erythema, lesion or rash.  Neurological:     General: No focal deficit present.     Mental Status: She is alert and oriented to person, place, and time. Mental status is at baseline.  Psychiatric:        Mood and Affect: Mood normal.        Behavior: Behavior normal.        Thought Content: Thought content normal.        Judgment: Judgment normal.     Results for orders placed or performed during the  hospital encounter of 11/07/22  Glucose, capillary  Result Value Ref Range   Glucose-Capillary 139 (H) 70 - 99 mg/dL  Glucose, capillary  Result Value Ref Range   Glucose-Capillary 111 (H) 70 - 99 mg/dL   Comment 1 Notify RN    Comment 2 Document in Chart       Assessment & Plan:   Problem List Items Addressed This Visit       Cardiovascular and Mediastinum   Essential hypertension    Under good control on current regimen. Continue current regimen. Continue to monitor. Call with any concerns. Refills given. Labs drawn today.        Relevant Medications   atorvastatin (LIPITOR) 20 MG tablet   metoprolol succinate (TOPROL-XL) 25 MG 24 hr tablet     Endocrine   Type 2 diabetes mellitus with diabetic neuropathy, unspecified (HCC) - Primary    Doing well with A1c of 6.6. Will keep on current regimen and recheck in 3 months. Call with any concerns.       Relevant Medications   atorvastatin (LIPITOR) 20 MG tablet   metFORMIN (GLUCOPHAGE) 500 MG tablet   Semaglutide,0.25 or 0.5MG /DOS, (OZEMPIC, 0.25 OR 0.5 MG/DOSE,) 2 MG/3ML SOPN   Other Relevant Orders   Bayer DCA Hb A1c Waived   CBC with Differential/Platelet   Comprehensive metabolic panel   Lipid Panel w/o Chol/HDL Ratio     Other   Neurogenic pain (Chronic)    Would like to get a 2nd opinion with neurology- referral placed today. Await their input.       Relevant Medications   DULoxetine (CYMBALTA) 60 MG capsule   Other Relevant Orders   Ambulatory referral to Neurology   Hyperlipidemia    Under good control on current regimen. Continue current regimen. Continue to monitor. Call with any concerns. Refills given. Labs drawn today.       Relevant Medications   atorvastatin (LIPITOR) 20 MG tablet   metoprolol succinate (TOPROL-XL) 25 MG 24 hr tablet   Other Visit Diagnoses     Breast pain       Will get set up for mammogram. Call with any concerns. Continue to monitor.   Relevant Orders   MM 3D SCREENING  MAMMOGRAM BILATERAL BREAST        Follow up plan: Return in about 3 months (around 03/07/2023).

## 2022-12-06 LAB — COMPREHENSIVE METABOLIC PANEL
ALT: 26 IU/L (ref 0–32)
AST: 30 IU/L (ref 0–40)
Albumin: 3.8 g/dL — ABNORMAL LOW (ref 3.9–4.9)
Alkaline Phosphatase: 86 IU/L (ref 44–121)
BUN/Creatinine Ratio: 4 — ABNORMAL LOW (ref 12–28)
BUN: 3 mg/dL — ABNORMAL LOW (ref 8–27)
Bilirubin Total: 0.6 mg/dL (ref 0.0–1.2)
CO2: 24 mmol/L (ref 20–29)
Calcium: 9.9 mg/dL (ref 8.7–10.3)
Chloride: 101 mmol/L (ref 96–106)
Creatinine, Ser: 0.72 mg/dL (ref 0.57–1.00)
Globulin, Total: 2 g/dL (ref 1.5–4.5)
Glucose: 94 mg/dL (ref 70–99)
Potassium: 4.4 mmol/L (ref 3.5–5.2)
Sodium: 143 mmol/L (ref 134–144)
Total Protein: 5.8 g/dL — ABNORMAL LOW (ref 6.0–8.5)
eGFR: 94 mL/min/{1.73_m2} (ref >59)

## 2022-12-06 LAB — LIPID PANEL W/O CHOL/HDL RATIO
Cholesterol, Total: 183 mg/dL (ref 100–199)
HDL: 115 mg/dL (ref >39)
LDL Chol Calc (NIH): 47 mg/dL (ref 0–99)
Triglycerides: 128 mg/dL (ref 0–149)
VLDL Cholesterol Cal: 21 mg/dL (ref 5–40)

## 2022-12-06 LAB — CBC WITH DIFFERENTIAL/PLATELET
Basophils Absolute: 0 10*3/uL (ref 0.0–0.2)
Basos: 0 %
EOS (ABSOLUTE): 0 10*3/uL (ref 0.0–0.4)
Eos: 0 %
Hematocrit: 37.8 % (ref 34.0–46.6)
Hemoglobin: 13.1 g/dL (ref 11.1–15.9)
Immature Grans (Abs): 0 10*3/uL (ref 0.0–0.1)
Immature Granulocytes: 1 %
Lymphocytes Absolute: 3 10*3/uL (ref 0.7–3.1)
Lymphs: 58 %
MCH: 33.6 pg — ABNORMAL HIGH (ref 26.6–33.0)
MCHC: 34.7 g/dL (ref 31.5–35.7)
MCV: 97 fL (ref 79–97)
Monocytes Absolute: 0.3 10*3/uL (ref 0.1–0.9)
Monocytes: 6 %
Neutrophils Absolute: 1.8 10*3/uL (ref 1.4–7.0)
Neutrophils: 35 %
Platelets: 254 10*3/uL (ref 150–450)
RBC: 3.9 x10E6/uL (ref 3.77–5.28)
RDW: 14.4 % (ref 11.7–15.4)
WBC: 5.2 10*3/uL (ref 3.4–10.8)
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
## 2022-12-11 ENCOUNTER — Encounter: Payer: Self-pay | Admitting: Pain Medicine

## 2022-12-11 ENCOUNTER — Ambulatory Visit: Payer: BC Managed Care – PPO | Attending: Pain Medicine | Admitting: Pain Medicine

## 2022-12-11 VITALS — BP 100/80 | HR 104 | Temp 97.1°F | Resp 18 | Ht 63.0 in | Wt 159.0 lb

## 2022-12-11 DIAGNOSIS — G8929 Other chronic pain: Secondary | ICD-10-CM | POA: Insufficient documentation

## 2022-12-11 DIAGNOSIS — M47817 Spondylosis without myelopathy or radiculopathy, lumbosacral region: Secondary | ICD-10-CM | POA: Insufficient documentation

## 2022-12-11 DIAGNOSIS — M47816 Spondylosis without myelopathy or radiculopathy, lumbar region: Secondary | ICD-10-CM | POA: Diagnosis not present

## 2022-12-11 DIAGNOSIS — Z09 Encounter for follow-up examination after completed treatment for conditions other than malignant neoplasm: Secondary | ICD-10-CM | POA: Insufficient documentation

## 2022-12-11 DIAGNOSIS — M545 Low back pain, unspecified: Secondary | ICD-10-CM | POA: Diagnosis not present

## 2022-12-11 NOTE — Progress Notes (Unsigned)
Safety precautions to be maintained throughout the outpatient stay will include: orient to surroundings, keep bed in low position, maintain call bell within reach at all times, provide assistance with transfer out of bed and ambulation.  

## 2022-12-12 ENCOUNTER — Telehealth: Payer: Self-pay

## 2022-12-12 DIAGNOSIS — M25371 Other instability, right ankle: Secondary | ICD-10-CM | POA: Diagnosis not present

## 2022-12-12 NOTE — Telephone Encounter (Signed)
Pharmacy is requesting specific directions to process the prescription. Requesting the frequency of how patient will be using medication and days supply limitations. 2 sets on RX please advise

## 2022-12-12 NOTE — Telephone Encounter (Signed)
What medication

## 2022-12-13 MED ORDER — GABAPENTIN 300 MG PO CAPS: ORAL_CAPSULE | ORAL | 1 refills | Status: DC

## 2022-12-17 ENCOUNTER — Other Ambulatory Visit: Payer: Self-pay | Admitting: Family Medicine

## 2022-12-17 DIAGNOSIS — M6281 Muscle weakness (generalized): Secondary | ICD-10-CM | POA: Diagnosis not present

## 2022-12-18 ENCOUNTER — Telehealth: Payer: Self-pay | Admitting: Family Medicine

## 2022-12-18 DIAGNOSIS — M2141 Flat foot [pes planus] (acquired), right foot: Secondary | ICD-10-CM | POA: Diagnosis not present

## 2022-12-18 DIAGNOSIS — E1142 Type 2 diabetes mellitus with diabetic polyneuropathy: Secondary | ICD-10-CM | POA: Diagnosis not present

## 2022-12-18 DIAGNOSIS — M545 Low back pain, unspecified: Secondary | ICD-10-CM | POA: Diagnosis not present

## 2022-12-18 DIAGNOSIS — D2371 Other benign neoplasm of skin of right lower limb, including hip: Secondary | ICD-10-CM | POA: Diagnosis not present

## 2022-12-18 DIAGNOSIS — M25571 Pain in right ankle and joints of right foot: Secondary | ICD-10-CM | POA: Diagnosis not present

## 2022-12-18 DIAGNOSIS — M19171 Post-traumatic osteoarthritis, right ankle and foot: Secondary | ICD-10-CM | POA: Diagnosis not present

## 2022-12-18 NOTE — Telephone Encounter (Signed)
Rx 12/05/22 #90 [id number]-too soon Requested Prescriptions  Pending Prescriptions Disp Refills   metoprolol succinate (TOPROL-XL) 25 MG 24 hr tablet [Pharmacy Med Name: METOPROLOL ER SUCCINATE 25MG  TABS] 90 tablet 1    Sig: TAKE 1 TABLET(25 MG) BY MOUTH DAILY     Cardiovascular:  Beta Blockers Passed - 12/17/2022 12:23 PM      Passed - Last BP in normal range    BP Readings from Last 1 Encounters:  12/11/22 100/80         Passed - Last Heart Rate in normal range    Pulse Readings from Last 1 Encounters:  12/11/22 (!) 104         Passed - Valid encounter within last 6 months    Recent Outpatient Visits           1 week ago Type 2 diabetes mellitus with diabetic neuropathy, without long-term current use of insulin (HCC)   [facility] [place], Megan P, DO   3 months ago Type 2 diabetes mellitus with diabetic neuropathy, without long-term current use of insulin (HCC)   [facility], Megan P, DO   6 months ago Routine general medical examination at a health care facility   [facility] [place], [place] P, DO   7 months ago Appointment canceled by hospital   [facility] M[place]um, Oswaldo Conroy, PA-C   8 months ago Trimalleolar fracture of right ankle, closed, with nonunion, subsequent encounter    [facility] Mecum, Oswaldo Conroy, PA-C       Future Appointments             In 2 months Laural Benes, Oralia Rud, DO  [facility]

## 2022-12-18 NOTE — Telephone Encounter (Signed)
Patient dropped off Disability Parking Placard to be completed by Dr. Laural Benes. I am placing form in the providers box. Informed patient to allow provider 48-72 hours for completion. Patients wants a call at [phone] when completed.

## 2022-12-20 NOTE — Telephone Encounter (Signed)
Called pt to notify paperwork is completed. No answer left vm will place paperwork in completed bin.

## 2022-12-26 ENCOUNTER — Ambulatory Visit: Payer: Self-pay | Admitting: *Deleted

## 2022-12-26 DIAGNOSIS — I10 Essential (primary) hypertension: Secondary | ICD-10-CM

## 2022-12-26 NOTE — Patient Outreach (Signed)
  Care Coordination   Follow Up Visit Note   12/27/2022 Name: Lisa Galvan MRN: [id number] DOB: 10-29-1960  Lisa Galvan is a 62 y.o. year old female who sees Lisa Carrow, DO for primary care. I spoke with  Lisa Galvan by phone today.  What matters to the patients health and wellness today?  Continue management of neuropathy and recover from carpal tunnel surgery    Goals Addressed             This Visit's Progress    Effective management of chronic health conditions   On track      Interventions Today    Flowsheet Row Most Recent Value  Chronic Disease   Chronic disease during today's visit Other  [neuropathy, carpal tunnel surgery]  General Interventions   General Interventions Discussed/Reviewed General Interventions Reviewed, Doctor Visits  [Right carpal tunnel surgery done, state she will still need the left one done in the future]  Doctor Visits Discussed/Reviewed Doctor Visits Reviewed, Specialist, PCP  Lisa Galvan 9/11, PCP 11/14]  PCP/Specialist Visits Compliance with follow-up visit  Education Interventions   Education Provided Provided Education  Provided Verbal Education On Medication, When to see the doctor, Other  [Has received cortisol injections in the past, state she will discuss having them in her ankle again if she continues to have discomfort]              SDOH assessments and interventions completed:  No     Care Coordination Interventions:  Yes, provided   Follow up plan: Follow up call scheduled for 11/18    Encounter Outcome:  Patient Visit Completed   Kemper Durie, RN, MSN, [facility] Care Management Care Management Coordinator [phone]

## 2022-12-27 ENCOUNTER — Telehealth: Payer: Self-pay | Admitting: *Deleted

## 2022-12-27 NOTE — Progress Notes (Signed)
  Care Coordination   Note   12/27/2022 Name: Nykhia Guzy MRN: [id number] DOB: [birth year]  Darlean Heinicke is a 62 y.o. year old female who sees Dorcas Carrow, DO for primary care. I reached out to [name] by phone today to offer care coordination services.  Ms. Marlett was given information about Care Coordination services today including:   The Care Coordination services include support from the care team which includes your Nurse Coordinator, Clinical Social Worker, or Pharmacist.  The Care Coordination team is here to help remove barriers to the health concerns and goals most important to you. Care Coordination services are voluntary, and the patient may decline or stop services at any time by request to their care team member.   Care Coordination Consent Status: Patient agreed to services and verbal consent obtained.   Follow up plan:  Telephone appointment with care coordination team member scheduled for:  01/03/2023  Encounter Outcome:  Patient Scheduled from referral   Burman Nieves, [facility] Care Coordination Care Guide Direct Dial: [phone]

## 2023-01-02 DIAGNOSIS — G5603 Carpal tunnel syndrome, bilateral upper limbs: Secondary | ICD-10-CM | POA: Diagnosis not present

## 2023-01-03 ENCOUNTER — Ambulatory Visit: Payer: Self-pay

## 2023-01-03 NOTE — Patient Outreach (Signed)
  Care Coordination   Follow Up Visit Note   01/03/2023 Name: Lisa Galvan MRN: [id number] DOB: [birth year]  Berdene Galvan is a 62 y.o. year old female who sees Lisa Carrow, DO for primary care. I spoke with  Lisa Galvan by phone today.  What matters to the patients health and wellness today?  Identify resources to assist with utility costs    Goals Addressed             This Visit's Progress    COMPLETED: Care Coordination Activities       Care Coordination Interventions: Discussed the patient has received a disconnect notice and must pay $304 by 9/15 to avoid a disconnect in utilities Provided the patient with resources advising she contact DSS, [employer] representative, and [employer] to inquire if funds are available Encouraged the patient to visit DSS today for assistance, provided the patient with the address to DSS         SDOH assessments and interventions completed:  Yes  SDOH Interventions Today    Flowsheet Row Most Recent Value  SDOH Interventions   Food Insecurity Interventions Intervention Not Indicated  Transportation Interventions Intervention Not Indicated  Utilities Interventions Other (Comment)  [Advised to call DSS and [employer]]        Care Coordination Interventions:  Yes, provided   Interventions Today    Flowsheet Row Most Recent Value  Chronic Disease   Chronic disease during today's visit Other  [Financial Resource Strain]  General Interventions   General Interventions Discussed/Reviewed General Interventions Discussed, [employer]  [Provided patient with resources to follow up on to assist with cost of utilities]        Follow up plan: No further intervention required.   Encounter Outcome:  Patient Visit Completed   Bevelyn Ngo, Kenard Gower, CDP Social Worker, Certified Dementia Practitioner [facility] Care Management  Care Coordination [phone]

## 2023-01-03 NOTE — Patient Instructions (Signed)
Visit Information  Thank you for taking time to visit with me today. Please don't hesitate to contact me if I can be of assistance to you.   Following are the goals we discussed today:  - Visit DSS today to apply for assistance - Call [employer] and [employer] to request assistance if DSS is out of funds  If you are experiencing a Mental Health or [employer] Crisis or need someone to talk to, please call 1-800-273-TALK (toll free, 24 hour hotline) call 911  Patient verbalizes understanding of instructions and care plan provided today and agrees to view in MyChart. Active MyChart status and patient understanding of how to access instructions and care plan via MyChart confirmed with patient.     No further follow up required: Please contact me as needed  Bevelyn Ngo, BSW, CDP Social Worker, Certified Dementia Practitioner [facility] Care Management  Care Coordination [phone]

## 2023-01-17 DIAGNOSIS — M6281 Muscle weakness (generalized): Secondary | ICD-10-CM | POA: Diagnosis not present

## 2023-02-03 DIAGNOSIS — M79662 Pain in left lower leg: Secondary | ICD-10-CM | POA: Diagnosis not present

## 2023-02-03 DIAGNOSIS — S8251XA Displaced fracture of medial malleolus of right tibia, initial encounter for closed fracture: Secondary | ICD-10-CM | POA: Diagnosis not present

## 2023-02-03 DIAGNOSIS — R296 Repeated falls: Secondary | ICD-10-CM | POA: Diagnosis not present

## 2023-02-03 DIAGNOSIS — R748 Abnormal levels of other serum enzymes: Secondary | ICD-10-CM | POA: Diagnosis not present

## 2023-02-03 DIAGNOSIS — M25561 Pain in right knee: Secondary | ICD-10-CM | POA: Diagnosis not present

## 2023-02-03 DIAGNOSIS — R55 Syncope and collapse: Secondary | ICD-10-CM | POA: Diagnosis not present

## 2023-02-03 DIAGNOSIS — M25562 Pain in left knee: Secondary | ICD-10-CM | POA: Diagnosis not present

## 2023-02-03 DIAGNOSIS — G580 Intercostal neuropathy: Secondary | ICD-10-CM | POA: Diagnosis not present

## 2023-02-03 DIAGNOSIS — G629 Polyneuropathy, unspecified: Secondary | ICD-10-CM | POA: Diagnosis not present

## 2023-02-03 DIAGNOSIS — S82401A Unspecified fracture of shaft of right fibula, initial encounter for closed fracture: Secondary | ICD-10-CM | POA: Diagnosis not present

## 2023-02-03 DIAGNOSIS — Z043 Encounter for examination and observation following other accident: Secondary | ICD-10-CM | POA: Diagnosis not present

## 2023-02-05 ENCOUNTER — Other Ambulatory Visit: Payer: Self-pay | Admitting: Surgery

## 2023-02-05 DIAGNOSIS — G5602 Carpal tunnel syndrome, left upper limb: Secondary | ICD-10-CM | POA: Diagnosis not present

## 2023-02-06 ENCOUNTER — Encounter
Admission: RE | Admit: 2023-02-06 | Discharge: 2023-02-06 | Disposition: A | Discharge status: Home / Self Care | Payer: BC Managed Care – PPO | Source: Ambulatory Visit | Attending: Surgery | Admitting: Surgery

## 2023-02-06 ENCOUNTER — Other Ambulatory Visit: Payer: Self-pay

## 2023-02-06 VITALS — Ht 63.0 in | Wt 160.0 lb

## 2023-02-06 DIAGNOSIS — Z01812 Encounter for preprocedural laboratory examination: Secondary | ICD-10-CM

## 2023-02-06 DIAGNOSIS — E1140 Type 2 diabetes mellitus with diabetic neuropathy, unspecified: Secondary | ICD-10-CM

## 2023-02-06 NOTE — Patient Instructions (Addendum)
Your procedure is scheduled [id number] October 17  Report to the Registration Desk on the 1st floor of the [facility] To find out your arrival time, please call [phone] between 1PM - 3PM on: Wednesday October 16  If your arrival time is 6:00 am, do not arrive before that time as the [facility] entrance doors do not open until 6:00 am.  REMEMBER: Instructions that are not followed completely may result in serious medical risk, up to and including death; or upon the discretion of your surgeon and anesthesiologist your surgery may need to be rescheduled.  Do not eat food after midnight the night before surgery.  No gum chewing or hard candies.  You may however, drink WATER up to 2 hours before you are scheduled to arrive for your surgery. Do not drink anything within 2 hours of your scheduled arrival time.  In addition, your doctor has ordered for you to drink the provided:  Gatorade G2 Drinking this carbohydrate drink up to two hours before surgery helps to reduce insulin resistance and improve patient outcomes. Please complete drinking 2 hours before scheduled arrival time.  One week prior to surgery: Stop Anti-inflammatories (NSAIDS) such as Advil, Aleve, Ibuprofen, Motrin, Naproxen, Naprosyn and Aspirin based products such as Excedrin, Goody's Powder, BC Powder.  Stop ANY OVER THE COUNTER supplements until after surgery. Cholecalciferol (VITAMIN D-3)  fexofenadine (ALLERGY RELIEF)  magnesium gluconate (MAGONATE)  vitamin B-12  You may however, continue to take Tylenol if needed for pain up until the day of surgery.  **Follow guidelines for insulin and diabetes medications.** Semaglutide,0.25 or 0.5MG /DOS, (OZEMPIC) hold 7 days prior to procedure, last dose Thursday October 10  metFORMIN (GLUCOPHAGE) hold 2 days prior to procedure, last dose Monday October 14    Continue taking all of your other prescription medications up until the day of surgery.  ON THE DAY OF  SURGERY ONLY TAKE THESE MEDICATIONS WITH SIPS OF WATER:  DULoxetine (CYMBALTA)  metoprolol succinate (TOPROL-XL)  omeprazole (PRILOSEC)  gabapentin (NEURONTIN)   No Alcohol for 24 hours before or after surgery.  No Smoking including e-cigarettes for 24 hours before surgery.  No chewable tobacco products for at least 6 hours before surgery.  No nicotine patches on the day of surgery.  Do not use any "recreational" drugs for at least a week (preferably 2 weeks) before your surgery.  Please be advised that the combination of cocaine and anesthesia may have negative outcomes, up to and including death. If you test positive for cocaine, your surgery will be cancelled.  On the morning of surgery brush your teeth with toothpaste and water, you may rinse your mouth with mouthwash if you wish. Do not swallow any toothpaste or mouthwash.  Use CHG Soap as directed on instruction sheet.  Do not wear jewelry, make-up, hairpins, clips or nail polish.  For welded (permanent) jewelry: bracelets, anklets, waist bands, etc.  Please have this removed prior to surgery.  If it is not removed, there is a chance that hospital personnel will need to cut it off on the day of surgery.  Do not wear lotions, powders, or perfumes.   Do not shave body hair from the neck down 48 hours before surgery.  Contact lenses, hearing aids and dentures may not be worn into surgery.  Do not bring valuables to the hospital. [facility] is not responsible for any missing/lost belongings or valuables.   Notify your doctor if there is any change in your medical condition (cold, fever, infection).  Wear comfortable clothing (specific to your surgery type) to the hospital.  After surgery, you can help prevent lung complications by doing breathing exercises.  Take deep breaths and cough every 1-2 hours. Your doctor may order a device called an Incentive Spirometer to help you take deep breaths. When coughing or sneezing,  hold a pillow firmly against your incision with both hands. This is called "splinting." Doing this helps protect your incision. It also decreases belly discomfort.  If you are being discharged the day of surgery, you will not be allowed to drive home. You will need a responsible individual to drive you home and stay with you for 24 hours after surgery.   If you are taking public transportation, you will need to have a responsible individual with you.  Please call the Pre-admissions Testing Dept. at [phone] if you have any questions about these instructions.  Surgery Visitation Policy:  Patients having surgery or a procedure may have two visitors.  Children under the age of 74 must have an adult with them who is not the patient.          Preparing for Surgery with CHLORHEXIDINE GLUCONATE (CHG) Soap  Chlorhexidine Gluconate (CHG) Soap  o An antiseptic cleaner that kills germs and bonds with the skin to continue killing germs even after washing  o Used for showering the night before surgery and morning of surgery  Before surgery, you can play an important role by reducing the number of germs on your skin.  CHG (Chlorhexidine gluconate) soap is an antiseptic cleanser which kills germs and bonds with the skin to continue killing germs even after washing.  Please do not use if you have an allergy to CHG or antibacterial soaps. If your skin becomes reddened/irritated stop using the CHG.  1. Shower the NIGHT BEFORE SURGERY and the MORNING OF SURGERY with CHG soap.  2. If you choose to wash your hair, wash your hair first as usual with your normal shampoo.  3. After shampooing, rinse your hair and body thoroughly to remove the shampoo.  4. Use CHG as you would any other liquid soap. You can apply CHG directly to the skin and wash gently with a scrungie or a clean washcloth.  5. Apply the CHG soap to your body only from the neck down. Do not use on open wounds or open sores.  Avoid contact with your eyes, ears, mouth, and genitals (private parts). Wash face and genitals (private parts) with your normal soap.  6. Wash thoroughly, paying special attention to the area where your surgery will be performed.  7. Thoroughly rinse your body with warm water.  8. Do not shower/wash with your normal soap after using and rinsing off the CHG soap.  9. Pat yourself dry with a clean towel.  10. Wear clean pajamas to bed the night before surgery.  12. Place clean sheets on your bed the night of your first shower and do not sleep with pets.  13. Shower again with the CHG soap on the day of surgery prior to arriving at the hospital.  14. Do not apply any deodorants/lotions/powders.  15. Please wear clean clothes to the hospital.

## 2023-02-07 ENCOUNTER — Encounter: Payer: Self-pay | Admitting: Surgery

## 2023-02-07 ENCOUNTER — Ambulatory Visit: Payer: BC Managed Care – PPO | Admitting: Urgent Care

## 2023-02-07 ENCOUNTER — Ambulatory Visit
Admission: RE | Admit: 2023-02-07 | Discharge: 2023-02-07 | Disposition: A | Discharge status: Home / Self Care | Payer: BC Managed Care – PPO | Attending: Surgery | Admitting: Surgery

## 2023-02-07 ENCOUNTER — Ambulatory Visit: Payer: Self-pay | Admitting: Urgent Care

## 2023-02-07 ENCOUNTER — Other Ambulatory Visit: Payer: Self-pay

## 2023-02-07 ENCOUNTER — Encounter
Admission: RE | Disposition: A | Discharge status: Home / Self Care | Payer: Self-pay | Source: Home / Self Care | Attending: Surgery

## 2023-02-07 DIAGNOSIS — Z7984 Long term (current) use of oral hypoglycemic drugs: Secondary | ICD-10-CM | POA: Insufficient documentation

## 2023-02-07 DIAGNOSIS — Z01812 Encounter for preprocedural laboratory examination: Secondary | ICD-10-CM

## 2023-02-07 DIAGNOSIS — Z7985 Long-term (current) use of injectable non-insulin antidiabetic drugs: Secondary | ICD-10-CM | POA: Diagnosis not present

## 2023-02-07 DIAGNOSIS — G5602 Carpal tunnel syndrome, left upper limb: Secondary | ICD-10-CM | POA: Diagnosis not present

## 2023-02-07 DIAGNOSIS — E1140 Type 2 diabetes mellitus with diabetic neuropathy, unspecified: Secondary | ICD-10-CM

## 2023-02-07 DIAGNOSIS — E1141 Type 2 diabetes mellitus with diabetic mononeuropathy: Secondary | ICD-10-CM | POA: Diagnosis not present

## 2023-02-07 HISTORY — PX: CARPAL TUNNEL RELEASE: SHX101

## 2023-02-07 LAB — GLUCOSE, CAPILLARY
Glucose-Capillary: 78 mg/dL (ref 70–99)
Glucose-Capillary: 96 mg/dL (ref 70–99)

## 2023-02-07 SURGERY — RELEASE, CARPAL TUNNEL, ENDOSCOPIC
Anesthesia: General | Site: Wrist | Laterality: Left

## 2023-02-07 MED ORDER — DEXAMETHASONE SODIUM PHOSPHATE 10 MG/ML IJ SOLN
INTRAMUSCULAR | Status: AC
  Filled 2023-02-07: qty 1

## 2023-02-07 MED ORDER — LIDOCAINE HCL (PF) 2 % IJ SOLN
INTRAMUSCULAR | Status: AC
  Filled 2023-02-07: qty 5

## 2023-02-07 MED ORDER — MIDAZOLAM HCL 2 MG/2ML IJ SOLN
INTRAMUSCULAR | Status: DC | PRN
  Administered 2023-02-07: 2 mg via INTRAVENOUS

## 2023-02-07 MED ORDER — HYDROCODONE-ACETAMINOPHEN 5-325 MG PO TABS: 1.0000 | ORAL_TABLET | Freq: Four times a day (QID) | ORAL | 0 refills | Status: DC | PRN

## 2023-02-07 MED ORDER — ONDANSETRON HCL 4 MG/2ML IJ SOLN
INTRAMUSCULAR | Status: AC
  Filled 2023-02-07: qty 2

## 2023-02-07 MED ORDER — CHLORHEXIDINE GLUCONATE 0.12 % MT SOLN
15.0000 mL | Freq: Once | OROMUCOSAL | Status: AC
  Administered 2023-02-07: 15 mL via OROMUCOSAL

## 2023-02-07 MED ORDER — HYDROCODONE-ACETAMINOPHEN 5-325 MG PO TABS: 1.0000 | ORAL_TABLET | ORAL | Status: DC | PRN

## 2023-02-07 MED ORDER — MIDAZOLAM HCL 2 MG/2ML IJ SOLN
INTRAMUSCULAR | Status: AC
  Filled 2023-02-07: qty 2

## 2023-02-07 MED ORDER — CEFAZOLIN SODIUM-DEXTROSE 2-4 GM/100ML-% IV SOLN
INTRAVENOUS | Status: AC
  Filled 2023-02-07: qty 100

## 2023-02-07 MED ORDER — SODIUM CHLORIDE 0.9 % IV SOLN: INTRAVENOUS | Status: DC

## 2023-02-07 MED ORDER — CHLORHEXIDINE GLUCONATE 0.12 % MT SOLN
OROMUCOSAL | Status: AC
  Filled 2023-02-07: qty 15

## 2023-02-07 MED ORDER — 0.9 % SODIUM CHLORIDE (POUR BTL) OPTIME
TOPICAL | Status: DC | PRN
  Administered 2023-02-07: 500 mL

## 2023-02-07 MED ORDER — ACETAMINOPHEN 325 MG PO TABS: 325.0000 mg | ORAL_TABLET | Freq: Four times a day (QID) | ORAL | Status: DC | PRN

## 2023-02-07 MED ORDER — METOCLOPRAMIDE HCL 10 MG PO TABS: 5.0000 mg | ORAL_TABLET | Freq: Three times a day (TID) | ORAL | Status: DC | PRN

## 2023-02-07 MED ORDER — ACETAMINOPHEN 10 MG/ML IV SOLN
INTRAVENOUS | Status: DC | PRN
  Administered 2023-02-07: 1000 mg via INTRAVENOUS

## 2023-02-07 MED ORDER — ONDANSETRON HCL 4 MG/2ML IJ SOLN
INTRAMUSCULAR | Status: DC | PRN
  Administered 2023-02-07: 4 mg via INTRAVENOUS

## 2023-02-07 MED ORDER — OZEMPIC (0.25 OR 0.5 MG/DOSE) 2 MG/3ML ~~LOC~~ SOPN: 0.2500 mg | PEN_INJECTOR | SUBCUTANEOUS | Status: DC

## 2023-02-07 MED ORDER — CEFAZOLIN SODIUM-DEXTROSE 2-4 GM/100ML-% IV SOLN
2.0000 g | INTRAVENOUS | Status: AC
  Administered 2023-02-07: 2 g via INTRAVENOUS

## 2023-02-07 MED ORDER — OXYCODONE HCL 5 MG/5ML PO SOLN: 5.0000 mg | Freq: Once | ORAL | Status: DC | PRN

## 2023-02-07 MED ORDER — BUPIVACAINE HCL (PF) 0.5 % IJ SOLN
INTRAMUSCULAR | Status: DC | PRN
  Administered 2023-02-07: 10 mL

## 2023-02-07 MED ORDER — ONDANSETRON HCL 4 MG PO TABS: 4.0000 mg | ORAL_TABLET | Freq: Four times a day (QID) | ORAL | Status: DC | PRN

## 2023-02-07 MED ORDER — KETOROLAC TROMETHAMINE 15 MG/ML IJ SOLN
15.0000 mg | Freq: Once | INTRAMUSCULAR | Status: AC
  Administered 2023-02-07: 15 mg via INTRAVENOUS

## 2023-02-07 MED ORDER — PHENYLEPHRINE 80 MCG/ML (10ML) SYRINGE FOR IV PUSH (FOR BLOOD PRESSURE SUPPORT)
PREFILLED_SYRINGE | INTRAVENOUS | Status: AC
  Filled 2023-02-07: qty 10

## 2023-02-07 MED ORDER — DEXTROSE-SODIUM CHLORIDE 5-0.9 % IV SOLN: INTRAVENOUS | Status: DC

## 2023-02-07 MED ORDER — FENTANYL CITRATE (PF) 100 MCG/2ML IJ SOLN: 25.0000 ug | INTRAMUSCULAR | Status: DC | PRN

## 2023-02-07 MED ORDER — BUPIVACAINE HCL (PF) 0.5 % IJ SOLN
INTRAMUSCULAR | Status: AC
  Filled 2023-02-07: qty 30

## 2023-02-07 MED ORDER — PHENYLEPHRINE 80 MCG/ML (10ML) SYRINGE FOR IV PUSH (FOR BLOOD PRESSURE SUPPORT)
PREFILLED_SYRINGE | INTRAVENOUS | Status: DC | PRN
  Administered 2023-02-07 (×2): 120 ug via INTRAVENOUS

## 2023-02-07 MED ORDER — FENTANYL CITRATE (PF) 100 MCG/2ML IJ SOLN
INTRAMUSCULAR | Status: AC
  Filled 2023-02-07: qty 2

## 2023-02-07 MED ORDER — ONDANSETRON HCL 4 MG/2ML IJ SOLN: 4.0000 mg | Freq: Four times a day (QID) | INTRAMUSCULAR | Status: DC | PRN

## 2023-02-07 MED ORDER — ACETAMINOPHEN 10 MG/ML IV SOLN
INTRAVENOUS | Status: AC
  Filled 2023-02-07: qty 100

## 2023-02-07 MED ORDER — OXYCODONE HCL 5 MG PO TABS: 5.0000 mg | ORAL_TABLET | Freq: Once | ORAL | Status: DC | PRN

## 2023-02-07 MED ORDER — LIDOCAINE HCL (CARDIAC) PF 100 MG/5ML IV SOSY
PREFILLED_SYRINGE | INTRAVENOUS | Status: DC | PRN
  Administered 2023-02-07: 100 mg via INTRAVENOUS

## 2023-02-07 MED ORDER — ORAL CARE MOUTH RINSE: 15.0000 mL | Freq: Once | OROMUCOSAL | Status: AC

## 2023-02-07 MED ORDER — FEXOFENADINE HCL 180 MG PO TABS: 180.0000 mg | ORAL_TABLET | Freq: Every day | ORAL | Status: DC

## 2023-02-07 MED ORDER — PROPOFOL 10 MG/ML IV BOLUS
INTRAVENOUS | Status: DC | PRN
  Administered 2023-02-07: 150 mg via INTRAVENOUS

## 2023-02-07 MED ORDER — KETOROLAC TROMETHAMINE 15 MG/ML IJ SOLN
INTRAMUSCULAR | Status: AC
  Filled 2023-02-07: qty 1

## 2023-02-07 MED ORDER — METOCLOPRAMIDE HCL 5 MG/ML IJ SOLN: 5.0000 mg | Freq: Three times a day (TID) | INTRAMUSCULAR | Status: DC | PRN

## 2023-02-07 MED ORDER — DEXAMETHASONE SODIUM PHOSPHATE 10 MG/ML IJ SOLN
INTRAMUSCULAR | Status: DC | PRN
  Administered 2023-02-07: 10 mg via INTRAVENOUS

## 2023-02-07 MED ORDER — FENTANYL CITRATE (PF) 100 MCG/2ML IJ SOLN
INTRAMUSCULAR | Status: DC | PRN
  Administered 2023-02-07: 50 ug via INTRAVENOUS

## 2023-02-07 SURGICAL SUPPLY — 37 items
APL PRP STRL LF DISP 70% ISPRP (MISCELLANEOUS) ×1
BNDG CMPR 5X2 KNTD ELC UNQ LF (GAUZE/BANDAGES/DRESSINGS) ×1
BNDG CMPR 5X4 CHSV STRCH STRL (GAUZE/BANDAGES/DRESSINGS) ×1
BNDG COHESIVE 4X5 TAN STRL LF (GAUZE/BANDAGES/DRESSINGS) ×1 IMPLANT
BNDG ELASTIC 2INX 5YD STR LF (GAUZE/BANDAGES/DRESSINGS) ×1 IMPLANT
BNDG ESMARCH 4 X 12 STRL LF (GAUZE/BANDAGES/DRESSINGS) ×1
BNDG ESMARCH 4X12 STRL LF (GAUZE/BANDAGES/DRESSINGS) ×1 IMPLANT
CHLORAPREP W/TINT 26 (MISCELLANEOUS) ×1 IMPLANT
CORD BIP STRL DISP 12FT (MISCELLANEOUS) ×1 IMPLANT
CUFF TOURN SGL QUICK 18X4 (TOURNIQUET CUFF) ×1 IMPLANT
DRAPE SURG 17X11 SM STRL (DRAPES) ×1 IMPLANT
DRSG XEROFORM 1X8 (GAUZE/BANDAGES/DRESSINGS) IMPLANT
FORCEPS JEWEL BIP 4-3/4 STR (INSTRUMENTS) ×1 IMPLANT
GAUZE SPONGE 4X4 12PLY STRL (GAUZE/BANDAGES/DRESSINGS) ×1 IMPLANT
GAUZE XEROFORM 1X8 LF (GAUZE/BANDAGES/DRESSINGS) ×1 IMPLANT
GLOVE BIO SURGEON STRL SZ8 (GLOVE) ×1 IMPLANT
GLOVE INDICATOR 8.0 STRL GRN (GLOVE) ×1 IMPLANT
GOWN STRL REUS W/ TWL LRG LVL3 (GOWN DISPOSABLE) ×1 IMPLANT
GOWN STRL REUS W/ TWL XL LVL3 (GOWN DISPOSABLE) ×1 IMPLANT
GOWN STRL REUS W/TWL LRG LVL3 (GOWN DISPOSABLE) ×1
GOWN STRL REUS W/TWL XL LVL3 (GOWN DISPOSABLE) ×1
KIT CARPAL TUNNEL (MISCELLANEOUS) ×1
KIT ESCP INSRT D SLOT CANN KN (MISCELLANEOUS) ×1 IMPLANT
KIT TURNOVER KIT A (KITS) ×1 IMPLANT
MANIFOLD NEPTUNE II (INSTRUMENTS) ×1 IMPLANT
NS IRRIG 500ML POUR BTL (IV SOLUTION) ×1 IMPLANT
PACK EXTREMITY ARMC (MISCELLANEOUS) ×1 IMPLANT
SPLINT WRIST LG LT TX990309 (SOFTGOODS) IMPLANT
SPLINT WRIST LG RT TX900304 (SOFTGOODS) IMPLANT
SPLINT WRIST M LT TX990308 (SOFTGOODS) IMPLANT
SPLINT WRIST M RT TX990303 (SOFTGOODS) IMPLANT
SPLINT WRIST XL LT TX990310 (SOFTGOODS) IMPLANT
SPLINT WRIST XL RT TX990305 (SOFTGOODS) IMPLANT
STOCKINETTE IMPERVIOUS 9X36 MD (GAUZE/BANDAGES/DRESSINGS) ×1 IMPLANT
SUT PROLENE 4 0 PS 2 18 (SUTURE) ×1 IMPLANT
TRAP FLUID SMOKE EVACUATOR (MISCELLANEOUS) ×1 IMPLANT
WATER STERILE IRR 500ML POUR (IV SOLUTION) ×1 IMPLANT

## 2023-02-07 NOTE — Anesthesia Procedure Notes (Signed)
Procedure Name: LMA Insertion Date/Time: 02/07/2023 9:44 AM  Performed by: Katherine Basset, CRNAPre-anesthesia Checklist: Patient identified, Emergency Drugs available, Suction available and Patient being monitored Patient Re-evaluated:Patient Re-evaluated prior to induction Oxygen Delivery Method: Circle system utilized Preoxygenation: Pre-oxygenation with 100% oxygen Induction Type: IV induction LMA: LMA inserted LMA Size: 4.0 Number of attempts: 1 Placement Confirmation: positive ETCO2 and breath sounds checked- equal and bilateral Tube secured with: Tape Dental Injury: Teeth and Oropharynx as per pre-operative assessment

## 2023-02-07 NOTE — Anesthesia Preprocedure Evaluation (Signed)
Anesthesia Evaluation  Patient identified by MRN, date of birth, ID band Patient awake    Reviewed: Allergy & Precautions, NPO status , Patient's Chart, lab work & pertinent test results  History of Anesthesia Complications Negative for: history of anesthetic complications  Airway Mallampati: IV   Neck ROM: Full    Dental  (+) Dental Advidsory Given, Chipped, Teeth Intact   Pulmonary neg shortness of breath, sleep apnea , neg COPD, neg recent URI   Pulmonary exam normal breath sounds clear to auscultation       Cardiovascular hypertension, (-) angina (-) Past MI and (-) Cardiac Stents Normal cardiovascular exam(-) dysrhythmias (-) Valvular Problems/Murmurs Rhythm:Regular Rate:Normal  ECG 12/11/21:  Unusual P axis, possible ectopic atrial tachycardia Nonspecific T wave abnormality  Myocardial perfusion 11/18/20: Normal myocardial perfusion without evidence of myocardial ischemia  Echo 11/18/20:  NORMAL LEFT VENTRICULAR SYSTOLIC FUNCTION   WITH MODERATE LVH  NORMAL RIGHT VENTRICULAR SYSTOLIC FUNCTION  TRIVIAL REGURGITATION NOTED  NO VALVULAR STENOSIS  ESTIMATED LVEF >55%  Aortic: TRIVIAL AI  Mitral: TRIVIAL MR  Tricuspid: TRIVIAL TR  Pulmonic: TRIVIAL P    Neuro/Psych neg Seizures PSYCHIATRIC DISORDERS  Depression     Neuromuscular disease (neuropathy)    GI/Hepatic ,GERD  ,,  Endo/Other  diabetes, Type 2    Renal/GU negative Renal ROS     Musculoskeletal  (+) Arthritis ,    Abdominal   Peds  Hematology  (+) Blood dyscrasia, anemia   Anesthesia Other Findings Note: lidocaine allergy was a rash to lidocaine patch; tolerated IV lidocaine for last surgery   Cardiology note 12/13/21:  62 y.o. female with  Encounter Diagnoses  Name Primary?  Benign essential HTN Yes  Aortic atherosclerosis (CMS-HCC)  LVH (left ventricular hypertrophy) due to hypertensive disease, without heart failure  Mixed hyperlipidemia   OSA (obstructive sleep apnea)  SOBOE (shortness of breath on exertion)  Type 2 diabetes mellitus with diabetic neuropathy, without long-term current use of insulin (CMS-HCC)   Plan  -No addition of medications at this time for essential hypertension. We have discussed the need for continued monitoring in the future.  -We have had a long discussion about the risk factors, causes, treatments, and manifestations of left ventricular hypertrophy. The long-term plan is to use medications, diet, exercise counseling, and risk factor modification to reduce future symptoms, manifestations, and the potential for congestive heart failure. -We have discussed risk reduction in the cardiovascular disease process by a continuation of lipid management with the current medication management for lipid reduction. The goals continue to be 30-50% lowering of LDL cholesterol in addition to lifestyle measures. This will include diet and improved activity level on a regular basis.The patient has an understanding of this discussion at this time and we will continue the appropriate strategy. -We have had a discussion of the benefits of CPAP machine use as well as its possible frustrations. The patient is encouraged to continue diligent use CPAP machine for reduction of sleep apnea signs and symptoms which may include shortness of breath, hypertension, and lower extremity edema. There also will be close evaluation of the equipment and future symptom resolution on a regular basis to provide the best outcomes of treatment. -No further intervention shortness of breath reported above multifactorial in nature including age, decreased exercise tolerance, disability, and/or medication management. The patient is to follow for any worsening symptoms or increase in severity for further in need in investigation or treatment options.  -Continue aggressive medical management of diabetes following the ABCs for  prevention of cardiovascular  disease and complications. The goals set forth include a goal HbA1c of less than 7, moderate to high intensity statin use if patient can tolerate, and a goal systolic blood pressure of below .  -The patient has been instructed to adhere to improving healthy lifestyle. We have specifically addressed the most important factors including abstinence of tobacco use, regular physical activity including the possibility of exercise prescription, healthy diet, and maintaining an appropriate body mass index.  Orders Placed This Encounter  Procedures  ORDER   Return if symptoms worsen or fail to improve.    Reproductive/Obstetrics                             Anesthesia Physical Anesthesia Plan  ASA: 2  Anesthesia Plan: General   Post-op Pain Management:    Induction: Intravenous  PONV Risk Score and Plan: 3 and Ondansetron, Dexamethasone, Treatment may vary due to age or medical condition and Midazolam  Airway Management Planned: LMA  Additional Equipment:   Intra-op Plan:   Post-operative Plan: Extubation in OR  Informed Consent: I have reviewed the patients History and Physical, chart, labs and discussed the procedure including the risks, benefits and alternatives for the proposed anesthesia with the patient or authorized representative who has indicated his/her understanding and acceptance.     Dental Advisory Given  Plan Discussed with: CRNA  Anesthesia Plan Comments: (Patient consented for risks of anesthesia including but not limited to:  - adverse reactions to medications - damage to eyes, teeth, lips or other oral mucosa - nerve damage due to positioning  - sore throat or hoarseness - Damage to heart, brain, nerves, lungs, other parts of body or loss of life  Patient voiced understanding and assent.)        Anesthesia Quick Evaluation

## 2023-02-07 NOTE — Anesthesia Postprocedure Evaluation (Signed)
Anesthesia Post Note  Patient: Jayona Mccaig  Procedure(s) Performed: CARPAL TUNNEL RELEASE ENDOSCOPIC (Left: Wrist)  Patient location during evaluation: PACU Anesthesia Type: General Level of consciousness: awake and alert Pain management: pain level controlled Vital Signs Assessment: post-procedure vital signs reviewed and stable Respiratory status: spontaneous breathing, nonlabored ventilation, respiratory function stable and patient connected to nasal cannula oxygen Cardiovascular status: blood pressure returned to baseline and stable Postop Assessment: no apparent nausea or vomiting Anesthetic complications: no  No notable events documented.   Last Vitals:  Vitals:   02/07/23 1116 02/07/23 1141  BP: 105/88 (!) 109/91  Pulse:  85  Resp: (!) 24 20  Temp:  (!) 36.1 C  SpO2:  99%    Last Pain:  Vitals:   02/07/23 1141  TempSrc: Temporal  PainSc: 0-No pain                 Stephanie Coup

## 2023-02-07 NOTE — Discharge Instructions (Addendum)
Orthopedic discharge instructions: Keep dressing dry and intact. Keep hand elevated above heart level. May shower after dressing removed on postop day 4 (Monday). Cover sutures with Band-Aids after drying off, then reapply Velcro splint. Apply ice to affected area frequently. Take ibuprofen 600 mg TID with meals for 3-5 days, then as necessary. Take ES Tylenol or pain medication as prescribed when needed.  Return for follow-up in 10-14 days or as scheduled.    AMBULATORY SURGERY  DISCHARGE INSTRUCTIONS   The drugs that you were given will stay in your system until tomorrow so for the next 24 hours you should not:  Drive an automobile Make any legal decisions Drink any alcoholic beverage   You may resume regular meals tomorrow.  Today it is better to start with liquids and gradually work up to solid foods.  You may eat anything you prefer, but it is better to start with liquids, then soup and crackers, and gradually work up to solid foods.   Please notify your doctor immediately if you have any unusual bleeding, trouble breathing, redness and pain at the surgery site, drainage, fever, or pain not relieved by medication.   Your post-operative visit with Dr.                                     is: Date:                        Time:    Please call to schedule your post-operative visit.  Additional Instructions:

## 2023-02-07 NOTE — H&P (Signed)
History of Present Illness:  Lisa Galvan is a 62 y.o. female who has left carpal tunnel issues. Patient has been previously seen by Dr. Jamelle Rushing he and worked up for this issue. She actually has had a right carpal tunnel release in July 2024. She was very happy with the outcome of the surgery and is now requesting intervention of her left carpal tunnel. She admits to having numbness and tingling that extends down into her ring and middle and pointer finger of her left hand. She also admits to having some weakness and difficulty with gripping objects. It has greatly impacted her ability day-to-day. She does admit to having diabetic induced neuropathy in both of her hands, however is hopeful that this will give her some relief as her previous surgery did on her right side. She has continued to have persistent left hand/wrist pain and paresthesias. She reports that it will keep her up at night and irritate her with any repetitive motion or activity. She is requesting intervention for a carpal tunnel release on her left side. She denies any history of clots or DVTs. She denies having any cardiac or pulmonary history/issues. She does have a history of diabetes, her last A1c was 6.6.  Social Hx: Patient is widowed. She does not use any nicotine or illicit drugs. Admits to 4 standard glasses of wine per week.  Allergies: Amlodipine-Benazepril Anaphylaxis  Dulaglutide Swelling (Face and mouth swelled up)  Iodinated Contrast Media Anaphylaxis  Lidocaine Rash (02-01-2021: Patient had lidocaine patch applied to chest at sight of injury and she experienced a rash, whelping, and swelling. Resolved after removing the patch)  02-01-2021: Patient had lidocaine patch applied to chest at sight of injury and she experienced a rash, whelping, and swelling. Resolved after removing the patch. Likely to be the adhesive as she can tolerate injectable lidocaine w/o problems.  Other Anaphylaxis  Pregabalin Swelling  Face/ mouth  swelled up balloon  Shellfish Containing Products Anaphylaxis  Ace Inhibitors Swelling   Home Medicines: aspirin 81 MG EC tablet Take 81 mg by mouth once daily  atorvastatin (LIPITOR) 20 MG tablet Take 20 mg by mouth once daily.  cholecalciferol, vitamin D3, (VITAMIN D3) 125 mcg (5,000 unit) tablet Take 5,000 Units by mouth once daily  cyanocobalamin (VITAMIN B12) 1000 MCG tablet Take 1,000 mcg by mouth 2 (two) times daily  DULoxetine (CYMBALTA) 30 MG DR capsule Take 3 capsules (90 mg total) by mouth once daily for 360 days Take one nightly along with 60 mg 270 capsule 3  ELIDEL 1 % cream Apply topically 2 (two) times daily 1  EPINEPHrine (EPIPEN) 0.3 mg/0.3 mL auto-injector  fexofenadine (ALLEGRA) 180 MG tablet Take 180 mg by mouth once daily  gabapentin (NEURONTIN) 100 MG capsule 300 mg by mouth every morning + 300 mg by mouth every afternoon + 400 mg by mouth every night. 90 capsule 3  gabapentin (NEURONTIN) 300 MG capsule 300 mg by mouth every morning + 300 mg by mouth every afternoon + 400 mg by mouth every night. 90 capsule 3  ketoconazole (NIZORAL) 2 % cream APPLY TOPICALLY TO THE AFFECTED AREA DAILY  MAG-G 27 mg magnesium (500 mg) tablet  magnesium oxide 500 mg Tab Take 500 mg by mouth once daily  metFORMIN (GLUCOPHAGE) 500 MG tablet Take 500 mg by mouth 2 (two) times daily  metoprolol succinate (TOPROL-XL) 25 MG XL tablet Take 1 tablet by mouth once daily  montelukast (SINGULAIR) 10 mg tablet Take by mouth Take 1 tablet (10 mg  total) by mouth at bedtime.  omeprazole (PRILOSEC) 40 MG DR capsule Take by mouth 2 (two) times daily before meals Take 1 capsule (40 mg total) by mouth daily before breakfast.  OZEMPIC 0.25 mg or 0.5 mg (2 mg/3 mL) pen injector Inject subcutaneously  potassium chloride (KLOR-CON) 10 mEq ER tablet Take 10 mEq by mouth 3 (three) times daily  sennosides-docusate (SENOKOT-S) 8.6-50 mg tablet Take by mouth  sucralfate (CARAFATE) 1 gram tablet Take 1 g by mouth as  needed  valACYclovir (VALTREX) 500 MG tablet Take 500 mg by mouth once daily as needed (fever blisters)  walker Misc Use 1 each once daily 1 each 0  metoprolol tartrate (LOPRESSOR) 25 MG tablet Take 25 mg by mouth 2 (two) times daily   Past Medical History:  DDD (degenerative disc disease), lumbar 12/16/2014  Diabetes mellitus type 2, uncomplicated (CMS/HHS-HCC)  GERD (gastroesophageal reflux disease)  Hyperlipidemia  Hypertension  Neuropathy  OSA (obstructive sleep apnea) 05/23/2020   Past Surgical History:  UMBILICAL HERNIA REPAIR 05/01/2017  ORIF left ankle (bimalleolar ankle fracture with fixation of lateral and medial malleoli) Left 07/30/2019 (Dr. Allena Katz)  robotic assisted laparoscopic ventral hernia repair IPOM using Round 11.4 cms ventralight BARD mesh 10/06/2019 (Dr Everlene Farrier, MD)  Endoscopic right carpal tunnel release Right 11/07/2022 (Dr.Josee Speece)  ABDOMINAL HYSTERECTOMY   Physical Exam:  Ht:160 cm (5\' 3" ) Wt:72.6 kg (160 lb) BMI: Body mass index is 28.34 kg/m.  General/Constitutional: No apparent distress: well-nourished and well developed. Eyes: Pupils equal, round with synchronous movement. Lymphatic: No palpable adenopathy. Respiratory: Patient noted to have nonlabored breathing with good chest rise and fall. Lungs are clear to auscultation bilaterally. No wheezes, rhonchi or rails appreciated. Cardiovascular: Upon auscultation, patient noted to have a regular rate and rhythm without any gallops, murmurs, thrills or heaves appreciated. Normal S1 and S2 sounds. Radial pulses appreciated at the left upper extremity. Cap refill less than 2 seconds Integumentary: No impressive skin lesions present, except as noted in detailed exam. Neuro/Psych: Normal mood and affect, oriented to person, place and time. Musculoskeletal: see exam below  Left wrist and hand exam: On inspection of the patient's left wrist, there does not appear to be any swelling, skin changes or gross deformities  appreciated. She denies having any tenderness along the dorsal or volar aspect of her wrist, palm or fingers. She demonstrates full active and passive range of motion of the wrist and fingers without any pain. She is neurovascularly intact to all digits. Phalen's test is positive on her left extremity. Radial pulses appreciated, 2+. Cap refill less than 2 seconds.  Assessment:  Left carpal tunnel syndrome.  Plan:  The treatment options were discussed with the patient. In addition, patient educational materials were provided regarding the diagnosis and treatment options. Regarding her left wrist/hand symptoms, the patient is ready to consider more aggressive treatment options. Therefore, I have recommended a surgical procedure, specifically an endoscopic left carpal tunnel release. The procedure was discussed with the patient, as were the potential risks (including bleeding, infection, nerve and/or blood vessel injury, persistent or recurrent pain/paresthesias, weakness of grip, need for further surgery, blood clots, strokes, heart attacks and/or arhythmias, pneumonia, etc.) and benefits. The patient states her understanding and wishes to proceed. All of the patient's questions and concerns were answered. She can call any time with further concerns. She will follow up post-surgery, routine.     H&P reviewed and patient re-examined. No changes.

## 2023-02-07 NOTE — Op Note (Signed)
02/07/2023  10:15 AM  Patient:   Lisa Galvan  Pre-Op Diagnosis:   Left carpal tunnel syndrome.  Post-Op Diagnosis:   Same.  Procedure:   Endoscopic left carpal tunnel release.  Surgeon:   Maryagnes Amos, MD  Anesthesia:   General LMA  Findings:   As above.  Complications:   None  EBL:   0 cc  Fluids:   400 cc crystalloid  TT:   11 minutes at 250 mmHg  Drains:   None  Closure:   4-0 Prolene interrupted sutures  Brief Clinical Note:   The patient is a 62 year old female with a history of progressive worsening pain and paresthesias to his left hand. Her symptoms have progressed despite medications, activity modification, etc. Her history and examination are consistent with carpal tunnel syndrome. The patient presents at this time for an endoscopic left carpal tunnel release.   Procedure:   The patient was brought into the operating room and lain in the supine position. After adequate general laryngeal mask anesthesia was obtained, the left hand and upper extremity were prepped with ChloraPrep solution before being draped sterilely. Preoperative antibiotics were administered. A timeout was performed to verify the appropriate surgical site before the limb was exsanguinated with an Esmarch and the tourniquet inflated to 250 mmHg.   An approximately 1.5-2 cm incision was made over the volar wrist flexion crease, centered over the palmaris longus tendon. The incision was carried down through the subcutaneous tissues with care taken to identify and protect any neurovascular structures. The distal forearm fascia was penetrated just proximal to the transverse carpal ligament. The soft tissues were released off the superficial and deep surfaces of the distal forearm fascia and this was released proximally for 3-4 cm under direct visualization.  Attention was directed distally. The Therapist, nutritional was passed beneath the transverse carpal ligament along the ulnar aspect of the carpal tunnel  and used to release any adhesions as well as to remove any adherent synovial tissue before first the smaller then the larger of the two dilators were passed beneath the transverse carpal ligament along the ulnar margin of the carpal tunnel. The slotted cannula was introduced and the endoscope was placed into the slotted cannula and the undersurface of the transverse carpal ligament visualized. The distal margin of the transverse carpal ligament was marked by placing a 25-gauge needle percutaneously at Kaplan's cardinal point so that it entered the distal portion of the slotted cannula. Under endoscopic visualization, the transverse carpal ligament was released from proximal to distal using the end-cutting blade. A second pass was performed to ensure complete release of the ligament. The adequacy of release was verified both endoscopically and by palpation using the freer elevator.  The wound was irrigated thoroughly with sterile saline solution before being closed using 4-0 Prolene interrupted sutures. A total of 10 cc of 0.5% plain Sensorcaine was injected in and around the incision before a sterile bulky dressing was applied to the wound. The patient was placed into a volar wrist splint before being awakened, extubated, and returned to the recovery room in satisfactory condition after tolerating the procedure well.

## 2023-02-07 NOTE — Transfer of Care (Signed)
Immediate Anesthesia Transfer of Care Note  Patient: Lisa Galvan  Procedure(s) Performed: CARPAL TUNNEL RELEASE ENDOSCOPIC (Left: Wrist)  Patient Location: PACU  Anesthesia Type:General  Level of Consciousness: awake, alert , and oriented  Airway & Oxygen Therapy: Patient Spontanous Breathing and Patient connected to face mask oxygen  Post-op Assessment: Report given to RN, Post -op Vital signs reviewed and stable, and Patient moving all extremities  Post vital signs: Reviewed and stable  Last Vitals:  Vitals Value Taken Time  BP 102/73 02/07/23 1022  Temp 36 C 02/07/23 1022  Pulse 82 02/07/23 1022  Resp 15 02/07/23 1023  SpO2 100 % 02/07/23 1022  Vitals shown include unfiled device data.  Last Pain:  Vitals:   02/07/23 0853  TempSrc: Temporal  PainSc: 6          Complications: No notable events documented.

## 2023-02-08 ENCOUNTER — Encounter: Payer: Self-pay | Admitting: Surgery

## 2023-02-10 NOTE — Plan of Care (Signed)
[facility] Tonsillectomy/Adenoidectomy, Postoperative PEDS care plan entered in error.

## 2023-02-12 ENCOUNTER — Ambulatory Visit: Payer: BC Managed Care – PPO | Admitting: Family Medicine

## 2023-02-12 ENCOUNTER — Ambulatory Visit: Payer: Self-pay | Admitting: *Deleted

## 2023-02-12 VITALS — BP 130/89 | HR 98 | Temp 98.1°F | Ht 61.81 in | Wt 159.2 lb

## 2023-02-12 DIAGNOSIS — R1310 Dysphagia, unspecified: Secondary | ICD-10-CM | POA: Insufficient documentation

## 2023-02-12 DIAGNOSIS — B370 Candidal stomatitis: Secondary | ICD-10-CM | POA: Diagnosis not present

## 2023-02-12 MED ORDER — FLUCONAZOLE 150 MG PO TABS: 150.0000 mg | ORAL_TABLET | Freq: Every day | ORAL | 0 refills | Status: AC

## 2023-02-12 NOTE — Progress Notes (Signed)
BP 130/89   Pulse 98   Temp 98.1 F (36.7 C) (Oral)   Ht 5' 1.81" (1.57 m)   Wt 159 lb 3.2 oz (72.2 kg)   SpO2 98%   BMI 29.30 kg/m    Subjective:    Patient ID: Lisa Galvan, female    DOB: [birth year], 62 y.o.   MRN: [id number]  HPI: Lisa Galvan is a 62 y.o. female  Chief Complaint  Patient presents with   Oral Pain    ORAL PAIN She is complaining of difficulty and pain with swallowing since her surgery on 02/07/2023. Currently taking Omeprazole 40 MG BID. She has history of chronic GERD without erosive esophagitis, history of H.pylori infection s/p triple therapy, confirmed eradication. She was followed by GI (04/09/2022), but has not been able to see them since ankle fx. GI Recommend EGD with proximal and distal esophageal biopsies and if unremarkable, recommend esophageal manometry to evaluate for esophageal dysmotility. Has history of previous biopsies in 2018.   Today she complains of discomfort with swallowing for 5 days now. Admits to redness on inside of mouth and white thrush on tongue. Describes sensation as burning when eating and drinking, causing her to take x1 pill at a time.  If she takes multiple pills it is causing her to choke and throw up, was not doing this before. She is able to swallow but has to do this slowly. Sunday she felt as if her throat was closing up, she also experienced swelling of both lips on Sunday after waking up, tried oral peroxide rinse and swelling improved. She denies new foods or recent medication changes.  Relevant past medical, surgical, family and social history reviewed and updated as indicated. Interim medical history since our last visit reviewed. Allergies and medications reviewed and updated.  Review of Systems  Constitutional:  Negative for chills and fever.  HENT:  Positive for mouth sores (Redness to jaws, thrush on tongue).   Respiratory: Negative.    Cardiovascular: Negative.     Per HPI unless specifically indicated  above     Objective:    BP 130/89   Pulse 98   Temp 98.1 F (36.7 C) (Oral)   Ht 5' 1.81" (1.57 m)   Wt 159 lb 3.2 oz (72.2 kg)   SpO2 98%   BMI 29.30 kg/m   Wt Readings from Last 3 Encounters:  02/12/23 159 lb 3.2 oz (72.2 kg)  02/07/23 160 lb (72.6 kg)  02/06/23 160 lb (72.6 kg)    Physical Exam Vitals and nursing note reviewed.  Constitutional:      General: She is awake. She is not in acute distress.    Appearance: Normal appearance. She is well-developed and well-groomed. She is not ill-appearing.  HENT:     Head: Normocephalic and atraumatic.     Right Ear: Hearing and external ear normal. No drainage.     Left Ear: Hearing and external ear normal. No drainage.     Nose: Nose normal.     Mouth/Throat:     Pharynx: Oropharynx is clear.     Comments: Thrush on tongue, redness of inside of jaws Eyes:     General: Lids are normal.        Right eye: No discharge.        Left eye: No discharge.     Conjunctiva/sclera: Conjunctivae normal.  Cardiovascular:     Rate and Rhythm: Normal rate and regular rhythm.     Pulses:  Radial pulses are 2+ on the right side and 2+ on the left side.       Posterior tibial pulses are 2+ on the right side and 2+ on the left side.     Heart sounds: Normal heart sounds, S1 normal and S2 normal. No murmur heard.    No gallop.  Pulmonary:     Effort: Pulmonary effort is normal. No accessory muscle usage or respiratory distress.     Breath sounds: Normal breath sounds.  Abdominal:     General: Bowel sounds are normal.     Palpations: Abdomen is soft.  Musculoskeletal:        General: Normal range of motion.     Cervical back: Full passive range of motion without pain and normal range of motion.     Right lower leg: No edema.     Left lower leg: No edema.  Skin:    General: Skin is warm and dry.     Capillary Refill: Capillary refill takes less than 2 seconds.  Neurological:     Mental Status: She is alert and oriented to  person, place, and time.  Psychiatric:        Attention and Perception: Attention normal.        Mood and Affect: Mood normal.        Speech: Speech normal.        Behavior: Behavior normal. Behavior is cooperative.        Thought Content: Thought content normal.     Results for orders placed or performed during the hospital encounter of 02/07/23  Glucose, capillary  Result Value Ref Range   Glucose-Capillary 78 70 - 99 mg/dL  Glucose, capillary  Result Value Ref Range   Glucose-Capillary 96 70 - 99 mg/dL      Assessment & Plan:   Problem List Items Addressed This Visit     Odynophagia - Primary    Chronic, stable. Will set patient up to get back in with GI specialist for further management, as this has increased more frequently after surgery requiring general anesthesia. Recommend liquid diet as tolerated until able to advance to soft diet.       Other Visit Diagnoses     Oral thrush       Acute, stable. Will treat with Fluconazole 150 MG daily for the next 7 days. Return in 1 week, call sooner if concerns arise.   Relevant Medications   fluconazole (DIFLUCAN) 150 MG tablet        Follow up plan: Return in about 1 week (around 02/19/2023) for Follow up thrush.

## 2023-02-12 NOTE — Assessment & Plan Note (Signed)
Chronic, stable. Will set patient up to get back in with GI specialist for further management, as this has increased more frequently after surgery requiring general anesthesia. Recommend liquid diet as tolerated until able to advance to soft diet.

## 2023-02-12 NOTE — Telephone Encounter (Signed)
    Chief Complaint: Mouth soreness, hurts to swallow after surgery. Coating on tongue. Lip feels swollen. Symptoms: Above Frequency: Last week Pertinent Negatives: Patient denies SOB or difficulty swallowing. Disposition: [] ED /[] Urgent Care (no appt availability in office) / [x] Appointment(In office/virtual)/ []  [facility]/ [] Home Care/ [] Refused Recommended Disposition /[] [facility]/ []  Follow-up with PCP Additional Notes: Instructed to call back for worsening of symptoms. Agrees with appointment.  Reason for Disposition  [1] White patches that stick to tongue or inner cheek AND [2] can be wiped off  Answer Assessment - Initial Assessment Questions 1. SYMPTOM: "What's the main symptom you're concerned about?" (e.g., chapped lips, dry mouth, lump, sores)     Soreness, inside lip is swollen 2. ONSET: "When did the    start?"     Last week 3. PAIN: "Is there any pain?" If Yes, ask: "How bad is it?" (Scale: 1-10; mild, moderate, severe)   - MILD (1-3):  doesn't interfere with eating or normal activities   - MODERATE (4-7): interferes with eating some solids and normal activities   - SEVERE (8-10):  excruciating pain, interferes with most normal activities   - SEVERE DYSPHAGIA: can't swallow liquids, drooling     Hurts to eat and swallow 4. CAUSE: "What do you think is causing the symptoms?"     Surgery 5. OTHER SYMPTOMS: "Do you have any other symptoms?" (e.g., fever, sore throat, toothache, swelling)     Coating on her tongue - white 6. PREGNANCY: "Is there any chance you are pregnant?" "When was your last menstrual period?"     No  Protocols used: Mouth Symptoms-A-AH

## 2023-02-12 NOTE — Patient Instructions (Addendum)
Liquid foods: chicken broth, beef broth, canned soup liquid, popsicles (sugar free), increased water intake, ice cream   Bananas, applesauce, rice and toast for decreased appetite

## 2023-02-12 NOTE — Telephone Encounter (Signed)
Summary: sore mouth   Pt called in about getting something prescribed for the soreness in her mouth from surgery she had. She didn't want to make an appt.         Attempted to call patient- no answer- left message for patient to call office

## 2023-02-21 ENCOUNTER — Ambulatory Visit: Payer: BC Managed Care – PPO | Admitting: Family Medicine

## 2023-02-21 ENCOUNTER — Encounter: Payer: Self-pay | Admitting: Family Medicine

## 2023-02-21 VITALS — BP 128/90 | HR 85 | Ht 61.0 in | Wt 157.4 lb

## 2023-02-21 DIAGNOSIS — R2689 Other abnormalities of gait and mobility: Secondary | ICD-10-CM

## 2023-02-21 DIAGNOSIS — N3000 Acute cystitis without hematuria: Secondary | ICD-10-CM

## 2023-02-21 DIAGNOSIS — R42 Dizziness and giddiness: Secondary | ICD-10-CM | POA: Diagnosis not present

## 2023-02-21 DIAGNOSIS — R296 Repeated falls: Secondary | ICD-10-CM | POA: Diagnosis not present

## 2023-02-21 DIAGNOSIS — B370 Candidal stomatitis: Secondary | ICD-10-CM

## 2023-02-21 LAB — MICROSCOPIC EXAMINATION: Bacteria, UA: NONE SEEN

## 2023-02-21 LAB — URINALYSIS, ROUTINE W REFLEX MICROSCOPIC
Bilirubin, UA: NEGATIVE
Ketones, UA: NEGATIVE
Nitrite, UA: NEGATIVE
Protein,UA: NEGATIVE
Specific Gravity, UA: 1.01 (ref 1.005–1.030)
Urobilinogen, Ur: 2 mg/dL — ABNORMAL HIGH (ref 0.2–1.0)
pH, UA: 6 (ref 5.0–7.5)

## 2023-02-21 MED ORDER — DICLOFENAC SODIUM 1 % EX GEL: 4.0000 g | Freq: Four times a day (QID) | CUTANEOUS | 4 refills | Status: AC

## 2023-02-21 MED ORDER — NYSTATIN 100000 UNIT/ML MT SUSP: 5.0000 mL | Freq: Four times a day (QID) | OROMUCOSAL | 0 refills | Status: AC

## 2023-02-24 ENCOUNTER — Encounter: Payer: Self-pay | Admitting: Family Medicine

## 2023-02-24 NOTE — Assessment & Plan Note (Signed)
BP improved. Continue to monitor closely. Continue current regimen. Continue to follow with neurology. Call with any concerns.

## 2023-03-01 ENCOUNTER — Other Ambulatory Visit: Payer: Self-pay | Admitting: Family Medicine

## 2023-03-01 LAB — URINE CULTURE

## 2023-03-01 MED ORDER — SULFAMETHOXAZOLE-TRIMETHOPRIM 800-160 MG PO TABS: 1.0000 | ORAL_TABLET | Freq: Two times a day (BID) | ORAL | 0 refills | Status: DC

## 2023-03-07 ENCOUNTER — Ambulatory Visit: Payer: BC Managed Care – PPO | Admitting: Family Medicine

## 2023-03-07 ENCOUNTER — Encounter: Payer: Self-pay | Admitting: Family Medicine

## 2023-03-07 VITALS — BP 136/96 | HR 99 | Ht 61.0 in | Wt 155.0 lb

## 2023-03-07 DIAGNOSIS — E1140 Type 2 diabetes mellitus with diabetic neuropathy, unspecified: Secondary | ICD-10-CM

## 2023-03-07 DIAGNOSIS — Z7985 Long-term (current) use of injectable non-insulin antidiabetic drugs: Secondary | ICD-10-CM

## 2023-03-07 DIAGNOSIS — R220 Localized swelling, mass and lump, head: Secondary | ICD-10-CM | POA: Diagnosis not present

## 2023-03-07 DIAGNOSIS — Z23 Encounter for immunization: Secondary | ICD-10-CM | POA: Diagnosis not present

## 2023-03-07 LAB — BAYER DCA HB A1C WAIVED: HB A1C (BAYER DCA - WAIVED): 5.7 % — ABNORMAL HIGH (ref 4.8–5.6)

## 2023-03-07 MED ORDER — OZEMPIC (0.25 OR 0.5 MG/DOSE) 2 MG/3ML ~~LOC~~ SOPN: 0.2500 mg | PEN_INJECTOR | SUBCUTANEOUS | 0 refills | Status: DC

## 2023-03-07 MED ORDER — METFORMIN HCL 500 MG PO TABS: 500.0000 mg | ORAL_TABLET | Freq: Every day | ORAL | Status: DC

## 2023-03-07 NOTE — Progress Notes (Signed)
BP (!) 136/96   Pulse 99   Ht 5\' 1"  (1.549 m)   Wt 155 lb (70.3 kg)   SpO2 97%   BMI 29.29 kg/m    Subjective:    Patient ID: Lisa Galvan, female    DOB: [birth year], 62 y.o.   MRN: [id number]  HPI: Lisa Galvan is a 62 y.o. female  Chief Complaint  Patient presents with   Diabetes   Has had some swelling on the R side of her face for about 6 months. Not painful. No issues with her teeth. Otherwise feeling well. No imaging.   DIABETES Hypoglycemic episodes:no Polydipsia/polyuria: no Visual disturbance: no Chest pain: no Paresthesias: no Glucose Monitoring: yes  Accucheck frequency: Daily Taking Insulin?: no Blood Pressure Monitoring: a few times a week Retinal Examination: Not up to Date Foot Exam: Up to Date Diabetic Education: Completed Pneumovax: Not up to Date Influenza: Up to Date Aspirin: yes  Relevant past medical, surgical, family and social history reviewed and updated as indicated. Interim medical history since our last visit reviewed. Allergies and medications reviewed and updated.  Review of Systems  Constitutional: Negative.   Respiratory: Negative.    Cardiovascular: Negative.   Gastrointestinal: Negative.   Musculoskeletal: Negative.   Psychiatric/Behavioral: Negative.      Per HPI unless specifically indicated above     Objective:    BP (!) 136/96   Pulse 99   Ht 5\' 1"  (1.549 m)   Wt 155 lb (70.3 kg)   SpO2 97%   BMI 29.29 kg/m   Wt Readings from Last 3 Encounters:  03/07/23 155 lb (70.3 kg)  02/21/23 157 lb 6.4 oz (71.4 kg)  02/12/23 159 lb 3.2 oz (72.2 kg)    Physical Exam Vitals and nursing note reviewed.  Constitutional:      General: She is not in acute distress.    Appearance: Normal appearance. She is not ill-appearing, toxic-appearing or diaphoretic.  HENT:     Head: Normocephalic and atraumatic.     Comments: R swelling of her cheek    Right Ear: External ear normal.     Left Ear: External ear normal.     Nose:  Nose normal.     Mouth/Throat:     Mouth: Mucous membranes are moist.     Pharynx: Oropharynx is clear.  Eyes:     General: No scleral icterus.       Right eye: No discharge.        Left eye: No discharge.     Extraocular Movements: Extraocular movements intact.     Conjunctiva/sclera: Conjunctivae normal.     Pupils: Pupils are equal, round, and reactive to light.  Cardiovascular:     Rate and Rhythm: Normal rate and regular rhythm.     Pulses: Normal pulses.     Heart sounds: Normal heart sounds. No murmur heard.    No friction rub. No gallop.  Pulmonary:     Effort: Pulmonary effort is normal. No respiratory distress.     Breath sounds: Normal breath sounds. No stridor. No wheezing, rhonchi or rales.  Chest:     Chest wall: No tenderness.  Musculoskeletal:        General: Normal range of motion.     Cervical back: Normal range of motion and neck supple.  Skin:    General: Skin is warm and dry.     Capillary Refill: Capillary refill takes less than 2 seconds.     Coloration: Skin is not jaundiced  or pale.     Findings: No bruising, erythema, lesion or rash.  Neurological:     General: No focal deficit present.     Mental Status: She is alert and oriented to person, place, and time. Mental status is at baseline.  Psychiatric:        Mood and Affect: Mood normal.        Behavior: Behavior normal.        Thought Content: Thought content normal.        Judgment: Judgment normal.     Results for orders placed or performed in visit on 02/21/23  Urine Culture   Specimen: Urine   UR  Result Value Ref Range   Urine Culture, Routine Final report (A)    Organism ID, Bacteria Proteus mirabilis (A)    Antimicrobial Susceptibility Comment   Microscopic Examination   Urine  Result Value Ref Range   WBC, UA 0-5 0 - 5 /hpf   RBC, Urine 0-2 0 - 2 /hpf   Epithelial Cells (non renal) 0-10 0 - 10 /hpf   Mucus, UA Present (A) Not Estab.   Bacteria, UA None seen None seen/Few   Urinalysis, Routine w reflex microscopic  Result Value Ref Range   Specific Gravity, UA 1.010 1.005 - 1.030   pH, UA 6.0 5.0 - 7.5   Color, UA Yellow Yellow   Appearance Ur Clear Clear   Leukocytes,UA 1+ (A) Negative   Protein,UA Negative Negative/Trace   Glucose, UA 2+ (A) Negative   Ketones, UA Negative Negative   RBC, UA Trace (A) Negative   Bilirubin, UA Negative Negative   Urobilinogen, Ur 2.0 (H) 0.2 - 1.0 mg/dL   Nitrite, UA Negative Negative   Microscopic Examination See below:       Assessment & Plan:   Problem List Items Addressed This Visit       Endocrine   Type 2 diabetes mellitus with diabetic neuropathy, unspecified (HCC) - Primary    Doing great with A1c of 5.7. Will cut her metformin to 1 daily and recheck 3 months. Call with any concerns.       Relevant Medications   Semaglutide,0.25 or 0.5MG /DOS, (OZEMPIC, 0.25 OR 0.5 MG/DOSE,) 2 MG/3ML SOPN   metFORMIN (GLUCOPHAGE) 500 MG tablet   Other Relevant Orders   Bayer DCA Hb A1c Waived     Other   Facial swelling   Relevant Orders   Ambulatory referral to ENT   Other Visit Diagnoses     Need for influenza vaccination       Relevant Orders   Flu vaccine trivalent PF, 6mos and older(Flulaval,Afluria,Fluarix,Fluzone)   Need for COVID-19 vaccine       Relevant Orders   Pfizer Comirnaty Covid -19 Vaccine 78yrs and older        Follow up plan: Return in about 3 months (around 06/07/2023) for physical.

## 2023-03-07 NOTE — Assessment & Plan Note (Signed)
Doing great with A1c of 5.7. Will cut her metformin to 1 daily and recheck 3 months. Call with any concerns.

## 2023-03-11 ENCOUNTER — Ambulatory Visit: Payer: Self-pay | Admitting: *Deleted

## 2023-03-11 NOTE — Patient Outreach (Signed)
  Care Coordination   03/11/2023 Name: Lisa Galvan MRN: [id number] DOB: 19-Dec-1960   Care Coordination Outreach Attempts:  An unsuccessful telephone outreach was attempted for a scheduled appointment today.  Follow Up Plan:  Additional outreach attempts will be made to offer the patient care coordination information and services.   Encounter Outcome:  No Answer   Care Coordination Interventions:  No, not indicated    Rodney Langton, RN, MSN, CCM   [facility], [facility] RN Care Coordinator Direct Dial: [phone] / Main [phone] Fax [phone] Email: [email or web address]

## 2023-03-11 NOTE — Patient Outreach (Signed)
  Care Coordination   Follow Up Visit Note   03/11/2023 Name: Lisa Galvan MRN: [id number] DOB: 05/02/1960  Female Lisa Galvan is a 62 y.o. year old female who sees Dorcas Carrow, DO for primary care. I spoke with  Lisa Galvan by phone today.  What matters to the patients health and wellness today?  Patient report she has been doing well, second carpal tunnel surgery completed, still has grandson in the home to help when needed.  Denies any urgent concerns, encouraged to contact this care manager with questions.      Goals Addressed             This Visit's Progress    Effective management of chronic health conditions   On track    Interventions Today    Flowsheet Row Most Recent Value  Chronic Disease   Chronic disease during today's visit Diabetes, Hypertension (HTN), Other  [recent carpal tunnel surgery]  General Interventions   General Interventions Discussed/Reviewed General Interventions Reviewed, Doctor Visits  [Discussed post surgery restrictions, able to drive now]  Doctor Visits Discussed/Reviewed Doctor Visits Reviewed, PCP, Specialist  [Dentist today, ortho on 12/2, Neuro on 12/6, and PCP on 2/19]  PCP/Specialist Visits Compliance with follow-up visit  Education Interventions   Education Provided Provided Education  Provided Verbal Education On Medication, When to see the doctor  [Taking medications as instructed, monitoring blood pressure regularly]              SDOH assessments and interventions completed:  No     Care Coordination Interventions:  Yes, provided   Follow up plan: Follow up call scheduled for 2/24    Encounter Outcome:  Patient Visit Completed   Rodney Langton, RN, MSN, CCM [place]  [facility], [facility] RN Care Coordinator Direct Dial: [phone] / Main [phone] Fax [phone] Email: [email or web address]: [email or web address]

## 2023-03-29 DIAGNOSIS — Z7984 Long term (current) use of oral hypoglycemic drugs: Secondary | ICD-10-CM | POA: Diagnosis not present

## 2023-03-29 DIAGNOSIS — Z888 Allergy status to other drugs, medicaments and biological substances status: Secondary | ICD-10-CM | POA: Diagnosis not present

## 2023-03-29 DIAGNOSIS — R2681 Unsteadiness on feet: Secondary | ICD-10-CM | POA: Diagnosis not present

## 2023-03-29 DIAGNOSIS — E1142 Type 2 diabetes mellitus with diabetic polyneuropathy: Secondary | ICD-10-CM | POA: Diagnosis not present

## 2023-03-29 DIAGNOSIS — Z91013 Allergy to seafood: Secondary | ICD-10-CM | POA: Diagnosis not present

## 2023-03-29 DIAGNOSIS — R600 Localized edema: Secondary | ICD-10-CM | POA: Diagnosis not present

## 2023-03-29 DIAGNOSIS — R7401 Elevation of levels of liver transaminase levels: Secondary | ICD-10-CM | POA: Diagnosis not present

## 2023-03-29 DIAGNOSIS — G629 Polyneuropathy, unspecified: Secondary | ICD-10-CM | POA: Diagnosis not present

## 2023-03-29 DIAGNOSIS — E785 Hyperlipidemia, unspecified: Secondary | ICD-10-CM | POA: Diagnosis not present

## 2023-03-29 DIAGNOSIS — K219 Gastro-esophageal reflux disease without esophagitis: Secondary | ICD-10-CM | POA: Diagnosis not present

## 2023-03-29 DIAGNOSIS — R296 Repeated falls: Secondary | ICD-10-CM | POA: Diagnosis not present

## 2023-03-29 DIAGNOSIS — M170 Bilateral primary osteoarthritis of knee: Secondary | ICD-10-CM | POA: Diagnosis not present

## 2023-03-29 DIAGNOSIS — Z91041 Radiographic dye allergy status: Secondary | ICD-10-CM | POA: Diagnosis not present

## 2023-03-29 DIAGNOSIS — Z7982 Long term (current) use of aspirin: Secondary | ICD-10-CM | POA: Diagnosis not present

## 2023-03-29 DIAGNOSIS — Z79899 Other long term (current) drug therapy: Secondary | ICD-10-CM | POA: Diagnosis not present

## 2023-03-29 DIAGNOSIS — Z884 Allergy status to anesthetic agent status: Secondary | ICD-10-CM | POA: Diagnosis not present

## 2023-04-01 DIAGNOSIS — M170 Bilateral primary osteoarthritis of knee: Secondary | ICD-10-CM | POA: Diagnosis not present

## 2023-04-04 ENCOUNTER — Telehealth: Payer: Self-pay | Admitting: Family Medicine

## 2023-04-04 MED ORDER — GABAPENTIN 300 MG PO CAPS: ORAL_CAPSULE | ORAL | 0 refills | Status: DC

## 2023-04-04 NOTE — Telephone Encounter (Signed)
I am happy to try to send it through. Unfortunately gabapentin is a controlled substance in some states and I'm not sure they will let me send it across state lines as I don't know if it's a controlled substance in [place]. If it is- there's nothing I can do about that and I would advise her to see an urgent care to get a refill. If she's able to get it, then all is good.

## 2023-04-04 NOTE — Telephone Encounter (Signed)
Medication Refill -  Most Recent Primary Care Visit:  Provider: Olevia Perches P  Department: [facility]  Visit Type: OFFICE VISIT  Date: 03/07/2023  Medication: gabapentin (NEURONTIN) 300 MG capsule  pt takes 3/day  Has the patient contacted their pharmacy? Yes (Agent: If no, request that the patient contact the pharmacy for the refill. If patient does not wish to contact the pharmacy document the reason why and proceed with request.) (Agent: If yes, when and what did the pharmacy advise?)  Is this the correct pharmacy for this prescription? Yes If no, delete pharmacy and type the correct one.  This is the patient's preferred pharmacy: [id number] - [place], [place] - [address] AT [facility] [address]   Has the prescription been filled recently? No  Is the patient out of the medication? Yes Pt is in [place], [place] and will not be home until Monday. Pt states she needs 15 caps to get her through to Monday and needs today  Has the patient been seen for an appointment in the last year OR does the patient have an upcoming appointment? Yes  Can we respond through MyChart? Yes  Agent: Please be advised that Rx refills may take up to 3 business days. We ask that you follow-up with your pharmacy.

## 2023-04-04 NOTE — Telephone Encounter (Signed)
Called and left a detailed VM for the patient notifying her of Dr. Henriette Combs message.

## 2023-04-09 ENCOUNTER — Ambulatory Visit: Payer: Self-pay | Admitting: *Deleted

## 2023-04-09 NOTE — Telephone Encounter (Signed)
Reason for Disposition  [1] Red or very tender (to touch) area AND [2] started over 24 hours after the bite  Answer Assessment - Initial Assessment Questions 1. TYPE of INSECT: "What type of insect was it?"      bedbug 2. ONSET: "When did you get bitten?"      Went out of town- 12/12- can home last night- 2 days ago noticed bite 3. LOCATION: "Where is the insect bite located?"      Face, chest, wrist 4. REDNESS: "Is the area red or pink?" If Yes, ask: "What size is area of redness?" (inches or cm). "When did the redness start?"     Redness 5. PAIN: "Is there any pain?" If Yes, ask: "How bad is it?"  (Scale 1-10; or mild, moderate, severe)     no 6. ITCHING: "Does it itch?" If Yes, ask: "How bad is the itch?"    - MILD: doesn't interfere with normal activities   - MODERATE-SEVERE: interferes with work, school, sleep, or other activities      severe 7. SWELLING: "How big is the swelling?" (inches, cm, or compare to coins)     Yes- wrist, face and chest swollen  Protocols used: Insect Bite-A-AH, Bed Bug Bite-A-AH

## 2023-04-09 NOTE — Telephone Encounter (Signed)
  Chief Complaint: possible bed bug bites Symptoms: red swollen areas on face, chest, wrist- patient had recent travel and thinks she was exposed to bed bugs Frequency: 2 days Pertinent Negatives: Patient denies pain Disposition: [] ED /[] Urgent Care (no appt availability in office) / [x] Appointment(In office/virtual)/ []  [facility]/ [] Home Care/ [] Refused Recommended Disposition /[] [facility]/ []  Follow-up with PCP Additional Notes: Patient advised care advice- appointment has been scheduled- patient advised if she should get worse- UC/ED

## 2023-04-10 ENCOUNTER — Encounter: Payer: Self-pay | Admitting: Pediatrics

## 2023-04-10 ENCOUNTER — Ambulatory Visit: Payer: BC Managed Care – PPO | Admitting: Pediatrics

## 2023-04-10 VITALS — BP 138/92 | HR 102 | Temp 98.7°F | Resp 16 | Wt 154.6 lb

## 2023-04-10 DIAGNOSIS — W57XXXA Bitten or stung by nonvenomous insect and other nonvenomous arthropods, initial encounter: Secondary | ICD-10-CM | POA: Diagnosis not present

## 2023-04-10 DIAGNOSIS — Z1330 Encounter for screening examination for mental health and behavioral disorders, unspecified: Secondary | ICD-10-CM | POA: Diagnosis not present

## 2023-04-10 DIAGNOSIS — I10 Essential (primary) hypertension: Secondary | ICD-10-CM

## 2023-04-10 DIAGNOSIS — L299 Pruritus, unspecified: Secondary | ICD-10-CM | POA: Diagnosis not present

## 2023-04-10 DIAGNOSIS — R21 Rash and other nonspecific skin eruption: Secondary | ICD-10-CM | POA: Diagnosis not present

## 2023-04-10 MED ORDER — HYDROXYZINE HCL 10 MG PO TABS: 5.0000 mg | ORAL_TABLET | Freq: Three times a day (TID) | ORAL | 0 refills | Status: AC | PRN

## 2023-04-10 MED ORDER — HYDROCORTISONE 0.5 % EX CREA: 1.0000 | TOPICAL_CREAM | Freq: Two times a day (BID) | CUTANEOUS | 0 refills | Status: DC

## 2023-04-10 MED ORDER — TRIAMCINOLONE ACETONIDE 0.025 % EX OINT: 1.0000 | TOPICAL_OINTMENT | Freq: Two times a day (BID) | CUTANEOUS | 0 refills | Status: DC

## 2023-04-10 NOTE — Assessment & Plan Note (Addendum)
Elevated blood pressure noted during the visit. The patient reported taking antihypertensive medication prior to the appointment. Recheck at follow up. Consider adjustments if persistently elevated. -Continue current management under Dr. Laural Benes.

## 2023-04-10 NOTE — Patient Instructions (Addendum)
For your chest and arms: triamcinolone 0.05% twice daily until rash clears For your face:   For itchiness: take 0.5-1 tabs of hydroxizine. Max 3 tabs per day.  We will check in on Friday to make sure it is getting better. If resolving, you may cancel the appointment

## 2023-04-10 NOTE — Progress Notes (Signed)
Office Visit  BP (!) 138/92 (BP Location: Left Arm, Patient Position: Sitting, Cuff Size: Normal)   Pulse (!) 102   Temp 98.7 F (37.1 C) (Oral)   Resp 16   Wt 154 lb 9.6 oz (70.1 kg)   SpO2 98%   BMI 29.21 kg/m    Subjective:    Patient ID: Lisa Galvan, female    DOB: 1961-02-22, 62 y.o.   MRN: [id number]  HPI: Lisa Galvan is a 62 y.o. female  Chief Complaint  Patient presents with   bed bugs    Visited hotel in [place]     Discussed the use of AI scribe software for clinical note transcription with the patient, who gave verbal consent to proceed.  History of Present Illness   The patient, who recently returned from a trip, presents with a pruritic rash that started prior to her return. The rash, which began before her arrival, was initially managed with Benadryl and topical cortisone. Despite these interventions, the patient reports persistent itchiness. The rash is located on the hands, face, and chest, but the patient denies any on her back. The patient reports that the rash feels like a 'big knot' and appears to be worsening, with areas 'coming together and getting swollen.'  The patient's trip was marked by an unfortunate encounter with bed bugs in a hotel, with one bug found in her room. The patient's daughter, who was also present during the trip but had a separate room, did not experience any similar issues.  The patient has been managing the itchiness with Benadryl, but reports that it makes her excessively tired. She has not been using any other treatments for the rash. The patient's blood pressure was noted to be elevated, which is being monitored by her primary care physician. She had taken her blood pressure medication just prior to the consultation.  The patient also recently experienced the loss of her mother, which has been a significant emotional event. Her mother had been suffering from dementia. The patient was able to spend time with her mother and also  communicated via Facetime.     Relevant past medical, surgical, family and social history reviewed and updated as indicated. Interim medical history since our last visit reviewed. Allergies and medications reviewed and updated.  ROS per HPI unless specifically indicated above     Objective:    BP (!) 138/92 (BP Location: Left Arm, Patient Position: Sitting, Cuff Size: Normal)   Pulse (!) 102   Temp 98.7 F (37.1 C) (Oral)   Resp 16   Wt 154 lb 9.6 oz (70.1 kg)   SpO2 98%   BMI 29.21 kg/m   Wt Readings from Last 3 Encounters:  04/10/23 154 lb 9.6 oz (70.1 kg)  03/07/23 155 lb (70.3 kg)  02/21/23 157 lb 6.4 oz (71.4 kg)     Physical Exam Constitutional:      Appearance: Normal appearance.  Pulmonary:     Effort: Pulmonary effort is normal.  Musculoskeletal:        General: Normal range of motion.  Skin:    Findings: Rash present.     Comments: Multiple rashes. See pictures below.  Neurological:     General: No focal deficit present.     Mental Status: She is alert. Mental status is at baseline.  Psychiatric:        Mood and Affect: Mood normal.        Behavior: Behavior normal.        Thought  Content: Thought content normal.               02/12/2023    1:35 PM 12/11/2022    2:06 PM 12/05/2022    9:41 AM 09/04/2022    9:08 AM 08/23/2022   10:57 AM  Depression screen PHQ 2/9  Decreased Interest 0 0 1 1 0  Down, Depressed, Hopeless 0 0 0 1 0  PHQ - 2 Score 0 0 1 2 0  Altered sleeping 0  0 1   Tired, decreased energy 0  1 0   Change in appetite 2  0 1   Feeling bad or failure about yourself  0  1 0   Trouble concentrating 0  0 0   Moving slowly or fidgety/restless 0  0 0   Suicidal thoughts 0  0 0   PHQ-9 Score 2  3 4    Difficult doing work/chores Not difficult at all  Not difficult at all         02/12/2023    1:35 PM 12/05/2022    9:42 AM 09/04/2022    9:08 AM 06/06/2022    9:27 AM  GAD 7 : Generalized Anxiety Score  Nervous, Anxious, on Edge 0 0 0  0  Control/stop worrying 0 0 1 0  Worry too much - different things 0 1 1 0  Trouble relaxing 0 0 0 0  Restless 0 0 0 0  Easily annoyed or irritable 0 0 0 0  Afraid - awful might happen 0 1 0 0  Total GAD 7 Score 0 2 2 0  Anxiety Difficulty Not difficult at all Not difficult at all Somewhat difficult Not difficult at all       Assessment & Plan:  Assessment & Plan   Bedbug bite, initial encounter Rash Itching Multiple pruritic lesions on the face, arms, and chest. No signs of infection. The patient has been using Benadryl and topical cortisone with some relief.  -Prescribe a higher potency topical steroid for the chest and arms. -Prescribe a lower potency topical steroid for the face to avoid irritation. -Prescribe hydroxyzine for itchiness. -Review progress on Friday - consider cellulitis tx if worsening -     Triamcinolone Acetonide; Apply 1 Application topically 2 (two) times daily. Until rash resolves. Max 2 weeks at a time.  Dispense: 30 g; Refill: 0 -     hydrOXYzine HCl; Take 0.5-1 tablets (5-10 mg total) by mouth 3 (three) times daily as needed for up to 10 days.  Dispense: 30 tablet; Refill: 0 -     Hydrocortisone; Apply 1 Application topically 2 (two) times daily. For face, until rash clears. Max 14 days at a time.  Dispense: 30 g; Refill: 0  Encounter for behavioral health screening As part of their intake evaluation, the patient was screened for depression, anxiety.  PHQ9 SCORE 2, GAD7 SCORE 0. Screening results negative for tested conditions. See plan under problem/diagnosis above.  Essential hypertension Assessment & Plan: Elevated blood pressure noted during the visit. The patient reported taking antihypertensive medication prior to the appointment. Recheck at follow up. Consider adjustments if persistently elevated. -Continue current management under Dr. Laural Benes.     Follow up plan: Return in about 2 days (around 04/12/2023) for rash.  Keilen Kahl Howell Pringle,  MD  Approximately 30 minutes spent on patient encounter today including assessment, counseling, diagnosing, treatment plan development, and charting.

## 2023-04-12 ENCOUNTER — Ambulatory Visit: Payer: BC Managed Care – PPO | Admitting: Pediatrics

## 2023-04-12 DIAGNOSIS — E119 Type 2 diabetes mellitus without complications: Secondary | ICD-10-CM | POA: Diagnosis not present

## 2023-04-12 DIAGNOSIS — R600 Localized edema: Secondary | ICD-10-CM | POA: Diagnosis not present

## 2023-04-12 DIAGNOSIS — R221 Localized swelling, mass and lump, neck: Secondary | ICD-10-CM | POA: Diagnosis not present

## 2023-04-12 DIAGNOSIS — R296 Repeated falls: Secondary | ICD-10-CM | POA: Diagnosis not present

## 2023-04-12 DIAGNOSIS — G629 Polyneuropathy, unspecified: Secondary | ICD-10-CM | POA: Diagnosis not present

## 2023-04-12 LAB — HM DIABETES EYE EXAM

## 2023-04-22 ENCOUNTER — Ambulatory Visit: Payer: Self-pay

## 2023-04-22 NOTE — Telephone Encounter (Signed)
  Chief Complaint: fall Symptoms: fall and hit head, 3 knots 2 have went away 1 still there  Frequency: last Wednesday  Pertinent Negatives: Patient denies HA Disposition: [] ED /[] Urgent Care (no appt availability in office) / [x] Appointment(In office/virtual)/ []  [facility]/ [] Home Care/ [] Refused Recommended Disposition /[] [facility]/ [x]  Follow-up with PCP Additional Notes: pt had fall last Wednesday d/t legs gave out on her. She fell on butt but then upper body fell back and she hit her head. Put ice on her head and 2 knots have went away but 1 still present. Pt asking if PCP can order XR or CT, advised we recommended with head injury going to ED, pt preferred to not do that unless she absolutely has to. Preferred to schedule appt for 04/29/23 and see if PCP will order scan in meantime. Advised I would send message back and have nurse FU with her.   Reason for Disposition  [1] Caller has URGENT question AND [2] triager unable to answer question  Answer Assessment - Initial Assessment Questions 1. MECHANISM: "How did the fall happen?"     Legs just gave out  3. ONSET: "When did the fall happen?" (e.g., minutes, hours, or days ago)     Last Wednesday  4. LOCATION: "What part of the body hit the ground?" (e.g., back, buttocks, head, hips, knees, hands, head, stomach)     Head  5. INJURY: "Did you hurt (injure) yourself when you fell?" If Yes, ask: "What did you injure? Tell me more about this?" (e.g., body area; type of injury; pain severity)"     Fell on butt but also hit head  6. PAIN: "Is there any pain?" If Yes, ask: "How bad is the pain?" (e.g., Scale 1-10; or mild,  moderate, severe)   - NONE (0): No pain   - MILD (1-3): Doesn't interfere with normal activities    - MODERATE (4-7): Interferes with normal activities or awakens from sleep    - SEVERE (8-10): Excruciating pain, unable to do any normal activities      No  7. SIZE: For cuts, bruises, or swelling,  ask: "How large is it?" (e.g., inches or centimeters)      3 knots on head, 2 have went away 1 egg size but reduced to 1/2 half  10. CAUSE: "What do you think caused the fall (or falling)?" (e.g., tripped, dizzy spell)       Legs gave out on her  Protocols used: Falls and [facility]

## 2023-04-23 DIAGNOSIS — I10 Essential (primary) hypertension: Secondary | ICD-10-CM | POA: Diagnosis not present

## 2023-04-23 DIAGNOSIS — E785 Hyperlipidemia, unspecified: Secondary | ICD-10-CM | POA: Diagnosis not present

## 2023-04-23 DIAGNOSIS — E1140 Type 2 diabetes mellitus with diabetic neuropathy, unspecified: Secondary | ICD-10-CM | POA: Diagnosis not present

## 2023-04-23 DIAGNOSIS — R079 Chest pain, unspecified: Secondary | ICD-10-CM | POA: Diagnosis not present

## 2023-04-23 DIAGNOSIS — Z7984 Long term (current) use of oral hypoglycemic drugs: Secondary | ICD-10-CM | POA: Diagnosis not present

## 2023-04-23 DIAGNOSIS — Z7982 Long term (current) use of aspirin: Secondary | ICD-10-CM | POA: Diagnosis not present

## 2023-04-23 DIAGNOSIS — R000 Tachycardia, unspecified: Secondary | ICD-10-CM | POA: Diagnosis not present

## 2023-04-23 DIAGNOSIS — W1839XA Other fall on same level, initial encounter: Secondary | ICD-10-CM | POA: Diagnosis not present

## 2023-04-23 DIAGNOSIS — S0003XA Contusion of scalp, initial encounter: Secondary | ICD-10-CM | POA: Diagnosis not present

## 2023-04-23 DIAGNOSIS — K219 Gastro-esophageal reflux disease without esophagitis: Secondary | ICD-10-CM | POA: Diagnosis not present

## 2023-04-23 DIAGNOSIS — M542 Cervicalgia: Secondary | ICD-10-CM | POA: Diagnosis not present

## 2023-04-23 DIAGNOSIS — I4711 Inappropriate sinus tachycardia, so stated: Secondary | ICD-10-CM | POA: Diagnosis not present

## 2023-04-23 NOTE — Telephone Encounter (Signed)
Patient was notified via MyChart of Dr Henriette Combs recommendations. Advised patient to give our office a call if she has any questions or concerns, please do not hesitate to let us know.

## 2023-04-23 NOTE — Telephone Encounter (Signed)
ER recommended for imaging.

## 2023-04-29 ENCOUNTER — Ambulatory Visit: Payer: BC Managed Care – PPO | Admitting: Nurse Practitioner

## 2023-05-03 ENCOUNTER — Emergency Department: Payer: BC Managed Care – PPO

## 2023-05-03 ENCOUNTER — Other Ambulatory Visit: Payer: Self-pay

## 2023-05-03 ENCOUNTER — Observation Stay
Admission: EM | Admit: 2023-05-03 | Discharge: 2023-05-04 | Disposition: A | Discharge status: Home / Self Care | Payer: BC Managed Care – PPO | Attending: Internal Medicine | Admitting: Internal Medicine

## 2023-05-03 ENCOUNTER — Encounter: Payer: Self-pay | Admitting: Family Medicine

## 2023-05-03 ENCOUNTER — Ambulatory Visit: Payer: Self-pay | Admitting: *Deleted

## 2023-05-03 ENCOUNTER — Ambulatory Visit: Payer: BC Managed Care – PPO | Admitting: Family Medicine

## 2023-05-03 VITALS — BP 136/76 | HR 88 | Wt 147.0 lb

## 2023-05-03 DIAGNOSIS — S0990XA Unspecified injury of head, initial encounter: Secondary | ICD-10-CM | POA: Diagnosis not present

## 2023-05-03 DIAGNOSIS — E1140 Type 2 diabetes mellitus with diabetic neuropathy, unspecified: Secondary | ICD-10-CM

## 2023-05-03 DIAGNOSIS — R296 Repeated falls: Secondary | ICD-10-CM | POA: Diagnosis not present

## 2023-05-03 DIAGNOSIS — F32A Depression, unspecified: Secondary | ICD-10-CM | POA: Diagnosis present

## 2023-05-03 DIAGNOSIS — R531 Weakness: Secondary | ICD-10-CM

## 2023-05-03 DIAGNOSIS — R197 Diarrhea, unspecified: Secondary | ICD-10-CM | POA: Diagnosis not present

## 2023-05-03 DIAGNOSIS — R159 Full incontinence of feces: Secondary | ICD-10-CM

## 2023-05-03 DIAGNOSIS — R42 Dizziness and giddiness: Secondary | ICD-10-CM

## 2023-05-03 DIAGNOSIS — Z7984 Long term (current) use of oral hypoglycemic drugs: Secondary | ICD-10-CM

## 2023-05-03 DIAGNOSIS — N390 Urinary tract infection, site not specified: Principal | ICD-10-CM | POA: Insufficient documentation

## 2023-05-03 DIAGNOSIS — W19XXXA Unspecified fall, initial encounter: Secondary | ICD-10-CM | POA: Diagnosis not present

## 2023-05-03 DIAGNOSIS — Y92009 Unspecified place in unspecified non-institutional (private) residence as the place of occurrence of the external cause: Secondary | ICD-10-CM

## 2023-05-03 DIAGNOSIS — R55 Syncope and collapse: Secondary | ICD-10-CM | POA: Diagnosis not present

## 2023-05-03 DIAGNOSIS — E785 Hyperlipidemia, unspecified: Secondary | ICD-10-CM | POA: Diagnosis not present

## 2023-05-03 DIAGNOSIS — E876 Hypokalemia: Secondary | ICD-10-CM | POA: Diagnosis not present

## 2023-05-03 DIAGNOSIS — R945 Abnormal results of liver function studies: Secondary | ICD-10-CM | POA: Diagnosis not present

## 2023-05-03 DIAGNOSIS — D72819 Decreased white blood cell count, unspecified: Secondary | ICD-10-CM | POA: Diagnosis not present

## 2023-05-03 DIAGNOSIS — I10 Essential (primary) hypertension: Secondary | ICD-10-CM | POA: Diagnosis not present

## 2023-05-03 DIAGNOSIS — N3000 Acute cystitis without hematuria: Secondary | ICD-10-CM | POA: Diagnosis not present

## 2023-05-03 DIAGNOSIS — R2689 Other abnormalities of gait and mobility: Secondary | ICD-10-CM | POA: Diagnosis not present

## 2023-05-03 DIAGNOSIS — Z8616 Personal history of COVID-19: Secondary | ICD-10-CM | POA: Insufficient documentation

## 2023-05-03 DIAGNOSIS — R7989 Other specified abnormal findings of blood chemistry: Secondary | ICD-10-CM | POA: Diagnosis present

## 2023-05-03 DIAGNOSIS — E119 Type 2 diabetes mellitus without complications: Secondary | ICD-10-CM

## 2023-05-03 LAB — URINALYSIS, ROUTINE W REFLEX MICROSCOPIC
Bilirubin, UA: NEGATIVE
Glucose, UA: NEGATIVE
Glucose, UA: NEGATIVE mg/dL
Hgb urine dipstick: NEGATIVE
Ketones, ur: 20 mg/dL — AB
Nitrite, UA: NEGATIVE
Nitrite: NEGATIVE
Protein, ur: 30 mg/dL — AB
Protein,UA: NEGATIVE
RBC, UA: NEGATIVE
Specific Gravity, UA: 1.01 (ref 1.005–1.030)
Specific Gravity, Urine: 1.019 (ref 1.005–1.030)
Urobilinogen, Ur: 2 mg/dL — ABNORMAL HIGH (ref 0.2–1.0)
WBC, UA: >50 WBC/hpf (ref 0–5)
pH, UA: 6 (ref 5.0–7.5)
pH: 5 (ref 5.0–8.0)

## 2023-05-03 LAB — CBC
HCT: 35.9 % — ABNORMAL LOW (ref 36.0–46.0)
Hemoglobin: 12.3 g/dL (ref 12.0–15.0)
MCH: 31.2 pg (ref 26.0–34.0)
MCHC: 34.3 g/dL (ref 30.0–36.0)
MCV: 91.1 fL (ref 80.0–100.0)
Platelets: 168 10*3/uL (ref 150–400)
RBC: 3.94 MIL/uL (ref 3.87–5.11)
RDW: 16.8 % — ABNORMAL HIGH (ref 11.5–15.5)
WBC: 2.9 10*3/uL — ABNORMAL LOW (ref 4.0–10.5)
nRBC: 0 % (ref 0.0–0.2)

## 2023-05-03 LAB — PHOSPHORUS: Phosphorus: 2.9 mg/dL (ref 2.5–4.6)

## 2023-05-03 LAB — MICROSCOPIC EXAMINATION
Bacteria, UA: NONE SEEN
RBC, Urine: NONE SEEN /[HPF] (ref 0–2)

## 2023-05-03 LAB — T4, FREE: Free T4: 1.06 ng/dL (ref 0.61–1.12)

## 2023-05-03 LAB — BASIC METABOLIC PANEL
Anion gap: 14 (ref 5–15)
BUN: 7 mg/dL — ABNORMAL LOW (ref 8–23)
CO2: 23 mmol/L (ref 22–32)
Calcium: 8.8 mg/dL — ABNORMAL LOW (ref 8.9–10.3)
Chloride: 102 mmol/L (ref 98–111)
Creatinine, Ser: 0.79 mg/dL (ref 0.44–1.00)
GFR, Estimated: >60 mL/min (ref >60)
Glucose, Bld: 71 mg/dL (ref 70–99)
Potassium: 3 mmol/L — ABNORMAL LOW (ref 3.5–5.1)
Sodium: 139 mmol/L (ref 135–145)

## 2023-05-03 LAB — HEPATIC FUNCTION PANEL
ALT: 44 U/L (ref 0–44)
AST: 103 U/L — ABNORMAL HIGH (ref 15–41)
Albumin: 2.9 g/dL — ABNORMAL LOW (ref 3.5–5.0)
Alkaline Phosphatase: 88 U/L (ref 38–126)
Bilirubin, Direct: 0.4 mg/dL — ABNORMAL HIGH (ref 0.0–0.2)
Indirect Bilirubin: 1.4 mg/dL — ABNORMAL HIGH (ref 0.3–0.9)
Total Bilirubin: 1.8 mg/dL — ABNORMAL HIGH (ref 0.0–1.2)
Total Protein: 5.6 g/dL — ABNORMAL LOW (ref 6.5–8.1)

## 2023-05-03 LAB — TROPONIN I (HIGH SENSITIVITY): Troponin I (High Sensitivity): 4 ng/L (ref <18)

## 2023-05-03 LAB — CBG MONITORING, ED: Glucose-Capillary: 91 mg/dL (ref 70–99)

## 2023-05-03 LAB — TSH: TSH: 2.434 u[IU]/mL (ref 0.350–4.500)

## 2023-05-03 LAB — MAGNESIUM: Magnesium: 1.4 mg/dL — ABNORMAL LOW (ref 1.7–2.4)

## 2023-05-03 MED ORDER — SODIUM CHLORIDE 0.9 % IV SOLN
INTRAVENOUS | Status: DC
Start: 2023-05-03 — End: 2023-05-04

## 2023-05-03 MED ORDER — ASPIRIN 81 MG PO TBEC
81.0000 mg | DELAYED_RELEASE_TABLET | Freq: Every day | ORAL | Status: DC
  Administered 2023-05-04: 81 mg via ORAL
  Filled 2023-05-03: qty 1

## 2023-05-03 MED ORDER — METOPROLOL SUCCINATE ER 50 MG PO TB24
25.0000 mg | ORAL_TABLET | Freq: Every day | ORAL | Status: DC
  Administered 2023-05-04: 25 mg via ORAL
  Filled 2023-05-03: qty 1

## 2023-05-03 MED ORDER — CEFTRIAXONE SODIUM 1 G IJ SOLR: 1.0000 g | INTRAMUSCULAR | Status: DC

## 2023-05-03 MED ORDER — GABAPENTIN 300 MG PO CAPS: 300.0000 mg | ORAL_CAPSULE | Freq: Two times a day (BID) | ORAL | Status: DC

## 2023-05-03 MED ORDER — DULOXETINE HCL 60 MG PO CPEP
60.0000 mg | ORAL_CAPSULE | Freq: Every day | ORAL | Status: DC
Start: 2023-05-04 — End: 2023-05-04
  Administered 2023-05-04: 60 mg via ORAL
  Filled 2023-05-03: qty 1

## 2023-05-03 MED ORDER — ATORVASTATIN CALCIUM 20 MG PO TABS
20.0000 mg | ORAL_TABLET | Freq: Every day | ORAL | Status: DC
  Administered 2023-05-04: 20 mg via ORAL
  Filled 2023-05-03: qty 1

## 2023-05-03 MED ORDER — HYDRALAZINE HCL 20 MG/ML IJ SOLN: 5.0000 mg | INTRAMUSCULAR | Status: DC | PRN

## 2023-05-03 MED ORDER — ONDANSETRON HCL 4 MG/2ML IJ SOLN: 4.0000 mg | Freq: Three times a day (TID) | INTRAMUSCULAR | Status: DC | PRN

## 2023-05-03 MED ORDER — MAGNESIUM SULFATE 2 GM/50ML IV SOLN
2.0000 g | Freq: Once | INTRAVENOUS | Status: AC
  Administered 2023-05-03: 2 g via INTRAVENOUS
  Filled 2023-05-03: qty 50

## 2023-05-03 MED ORDER — CEFTRIAXONE SODIUM 1 G IJ SOLR
1.0000 g | Freq: Once | INTRAMUSCULAR | Status: AC
  Administered 2023-05-03: 1 g via INTRAVENOUS
  Filled 2023-05-03: qty 10

## 2023-05-03 MED ORDER — PANTOPRAZOLE SODIUM 40 MG PO TBEC
40.0000 mg | DELAYED_RELEASE_TABLET | Freq: Every day | ORAL | Status: DC
Start: 2023-05-04 — End: 2023-05-04
  Administered 2023-05-04: 40 mg via ORAL
  Filled 2023-05-03: qty 1

## 2023-05-03 MED ORDER — POTASSIUM CHLORIDE CRYS ER 20 MEQ PO TBCR
40.0000 meq | EXTENDED_RELEASE_TABLET | Freq: Once | ORAL | Status: AC
  Administered 2023-05-03: 40 meq via ORAL
  Filled 2023-05-03: qty 2

## 2023-05-03 MED ORDER — GABAPENTIN 300 MG PO CAPS
600.0000 mg | ORAL_CAPSULE | Freq: Every day | ORAL | Status: DC
Start: 2023-05-03 — End: 2023-05-04
  Administered 2023-05-03: 600 mg via ORAL
  Filled 2023-05-03: qty 2

## 2023-05-03 MED ORDER — IBUPROFEN 400 MG PO TABS: 200.0000 mg | ORAL_TABLET | Freq: Four times a day (QID) | ORAL | Status: DC | PRN

## 2023-05-03 MED ORDER — GABAPENTIN 300 MG PO CAPS
300.0000 mg | ORAL_CAPSULE | Freq: Every day | ORAL | Status: DC
  Administered 2023-05-04: 300 mg via ORAL
  Filled 2023-05-03: qty 1

## 2023-05-03 MED ORDER — ENOXAPARIN SODIUM 40 MG/0.4ML IJ SOSY
40.0000 mg | PREFILLED_SYRINGE | INTRAMUSCULAR | Status: DC
  Administered 2023-05-03: 40 mg via SUBCUTANEOUS
  Filled 2023-05-03: qty 0.4

## 2023-05-03 MED ORDER — LACTATED RINGERS IV BOLUS: 1000.0000 mL | Freq: Once | INTRAVENOUS | Status: DC

## 2023-05-03 MED ORDER — INSULIN ASPART 100 UNIT/ML IJ SOLN: 0.0000 [IU] | Freq: Three times a day (TID) | INTRAMUSCULAR | Status: DC

## 2023-05-03 MED ORDER — INSULIN ASPART 100 UNIT/ML IJ SOLN: 0.0000 [IU] | Freq: Every day | INTRAMUSCULAR | Status: DC

## 2023-05-03 NOTE — ED Provider Notes (Signed)
 [facility] Provider Note    Event Date/Time   First MD Initiated Contact with Patient 05/03/23 1527     (approximate)   History   Chief Complaint Dizziness and Fall   HPI  Lisa Galvan is a 63 y.o. female with past medical history of hypertension, hyperlipidemia, diabetes, and chronic pain syndrome who presents to the ED complaining of dizziness and multiple falls.  Patient reports that she had an initial fall in mid December and was evaluated at an outside ED for this, had imaging at the time that was unremarkable.  Since then, she has continued to feel dizzy and lightheaded at times, has not passed out but has felt like her legs will give out on her.  This has caused her to fall at least 6 times in the past 10 days, she reports hitting her head on multiple additional occasions, most recently 5 days ago.  She denies any loss of consciousness and does not take any blood thinners.  She has not had any pain in her neck and denies any pain to her trunk or extremities.  She reports feeling weak all over and unsteady on her feet, currently lives alone.  She denies any fevers, cough, chest pain, shortness of breath, nausea, vomiting, diarrhea, or dysuria.     Physical Exam   Triage Vital Signs: ED Triage Vitals  Encounter Vitals Group     BP 05/03/23 1439 (!) 111/95     Systolic BP Percentile --      Diastolic BP Percentile --      Pulse Rate 05/03/23 1439 94     Resp 05/03/23 1439 18     Temp 05/03/23 1439 97.8 F (36.6 C)     Temp Source 05/03/23 1439 Oral     SpO2 05/03/23 1439 92 %     Weight 05/03/23 1446 147 lb (66.7 kg)     Height 05/03/23 1446 5' 2 (1.575 m)     Head Circumference --      Peak Flow --      Pain Score 05/03/23 1440 0     Pain Loc --      Pain Education --      Exclude from Growth Chart --     Most recent vital signs: Vitals:   05/03/23 1439 05/03/23 1825  BP: (!) 111/95 103/76  Pulse: 94 93  Resp: 18 18  Temp: 97.8 F  (36.6 C)   SpO2: 92% 100%    Constitutional: Alert and oriented. Eyes: Conjunctivae are normal. Head: Atraumatic. Nose: No congestion/rhinnorhea. Mouth/Throat: Mucous membranes are moist.  Neck: No midline cervical spine tenderness to palpation, able to rotate neck to 45 degrees bilaterally without pain. Cardiovascular: Normal rate, regular rhythm. Grossly normal heart sounds.  2+ radial pulses bilaterally. Respiratory: Normal respiratory effort.  No retractions. Lungs CTAB.  No chest wall tenderness to palpation. Gastrointestinal: Soft and nontender. No distention. Musculoskeletal: No lower extremity tenderness nor edema.  No upper extremity bony tenderness to palpation. Neurologic:  Normal speech and language. No gross focal neurologic deficits are appreciated.    ED Results / Procedures / Treatments   Labs (all labs ordered are listed, but only abnormal results are displayed) Labs Reviewed  BASIC METABOLIC PANEL - Abnormal; Notable for the following components:      Result Value   Potassium 3.0 (*)    BUN 7 (*)    Calcium  8.8 (*)    All other components within normal limits  CBC -  Abnormal; Notable for the following components:   WBC 2.9 (*)    HCT 35.9 (*)    RDW 16.8 (*)    All other components within normal limits  URINALYSIS, ROUTINE W REFLEX MICROSCOPIC - Abnormal; Notable for the following components:   Color, Urine AMBER (*)    APPearance HAZY (*)    Bilirubin Urine SMALL (*)    Ketones, ur 20 (*)    Protein, ur 30 (*)    Leukocytes,Ua MODERATE (*)    Bacteria, UA FEW (*)    Non Squamous Epithelial PRESENT (*)    All other components within normal limits  HEPATIC FUNCTION PANEL - Abnormal; Notable for the following components:   Total Protein 5.6 (*)    Albumin 2.9 (*)    AST 103 (*)    Total Bilirubin 1.8 (*)    Bilirubin, Direct 0.4 (*)    Indirect Bilirubin 1.4 (*)    All other components within normal limits  MAGNESIUM  - Abnormal; Notable for the  following components:   Magnesium  1.4 (*)    All other components within normal limits  URINE CULTURE  TSH  T4, FREE  CBG MONITORING, ED  TROPONIN I (HIGH SENSITIVITY)     EKG  ED ECG REPORT I, Carlin Palin, the attending physician, personally viewed and interpreted this ECG.   Date: 05/03/2023  EKG Time: 14:44  Rate: 92  Rhythm: normal sinus rhythm  Axis: Normal  Intervals:none  ST&T Change: Nonspecific T wave abnormality  RADIOLOGY CT head reviewed and interpreted by me with no hemorrhage or midline shift.  PROCEDURES:  Critical Care performed: No  Procedures   MEDICATIONS ORDERED IN ED: Medications  cefTRIAXone  (ROCEPHIN ) 1 g in sodium chloride  0.9 % 100 mL IVPB (has no administration in time range)  magnesium  sulfate IVPB 2 g 50 mL (has no administration in time range)  lactated ringers  bolus 1,000 mL (has no administration in time range)  potassium chloride  SA (KLOR-CON  M) CR tablet 40 mEq (40 mEq Oral Given 05/03/23 1704)     IMPRESSION / MDM / ASSESSMENT AND PLAN / ED COURSE  I reviewed the triage vital signs and the nursing notes.                              63 y.o. female with past medical history of hypertension, hyperlipidemia, diabetes, and chronic pain syndrome who presents to the ED complaining of increasing weakness and unsteady gait over the past 2 weeks with multiple falls in the past 10 days.  Patient's presentation is most consistent with acute presentation with potential threat to life or bodily function.  Differential diagnosis includes, but is not limited to, stroke, TIA, anemia, electrolyte abnormality, AKI, UTI, pneumonia, hypothyroidism, orthostatic hypotension.  Patient nontoxic-appearing and in no acute distress, vital signs are unremarkable.  EKG shows no evidence of arrhythmia or ischemia, we will add on troponin but low suspicion for cardiac etiology as she denies any chest pain or shortness of breath.  She does not have any focal  neurologic deficits on exam, will repeat CT head given multiple recent falls but we are able to clear her cervical spine clinically.  Initial labs with mild leukopenia, no significant anemia or AKI, she does have mild hypokalemia.  We will add on LFTs, magnesium  level, and thyroid  studies.  Chest x-ray also pending at this time.  Urinalysis borderline for UTI and we will send for culture.  CT  head is negative for acute process, chest x-ray also unremarkable.  Additional labs show hypomagnesemia, thyroid  studies unremarkable.  Patient with positive orthostatic vital signs, will give IV fluid bolus.  She continues to be unsteady on her feet with difficulty walking, case discussed with hospitalist for admission and order for MRI brain was placed.      FINAL CLINICAL IMPRESSION(S) / ED DIAGNOSES   Final diagnoses:  Multiple falls  Generalized weakness     Rx / DC Orders   ED Discharge Orders     None        Note:  This document was prepared using Dragon voice recognition software and may include unintentional dictation errors.   Willo Dunnings, MD 05/03/23 [phone]

## 2023-05-03 NOTE — Telephone Encounter (Signed)
 Reason for Disposition  [1] Dizziness caused by heat exposure, sudden standing, or poor fluid intake AND [2] no improvement after 2 hours of rest and fluids  Answer Assessment - Initial Assessment Questions 1. DESCRIPTION: Describe your dizziness.     I'm still falling and dehydrated and a nasty bruise on the back of my head.    I was discharged from the hospital.   I was admitted to the hospital for falls because I was dehydrated.   I didn't get any fluids in the hospital.    I went to the ED but I was not admitted.   I was not given any IV fluids in the ED.   I'm having diarrhea from the gabapentin .   I've been taking it so long and that's one of the side effects of gabapentin  after being on it so long was diarrhea. I went to [facility].    2. LIGHTHEADED: Do you feel lightheaded? (e.g., somewhat faint, woozy, weak upon standing)     I'm always feeling dizzy when I get up.   I use my walker so I won't fall but it doesn't really help.   My body will just release and fall and I have to call my daughter to come get me up.   This has been going on for the last couple of weeks. 3. VERTIGO: Do you feel like either you or the room is spinning or tilting? (i.e. vertigo)     No 4. SEVERITY: How bad is it?  Do you feel like you are going to faint? Can you stand and walk?   - MILD: Feels slightly dizzy, but walking normally.   - MODERATE: Feels unsteady when walking, but not falling; interferes with normal activities (e.g., school, work).   - SEVERE: Unable to walk without falling, or requires assistance to walk without falling; feels like passing out now.      Severe   Using a walker and still falling.      My legs just release and I fall.   I try to hold the walker but I still fall. 5. ONSET:  When did the dizziness begin?     The has been going on bad for the last couple of weeks.    6. AGGRAVATING FACTORS: Does anything make it worse? (e.g., standing, change in head position)     I'm so weak.     Denies being sick with URI. 7. HEART RATE: Can you tell me your heart rate? How many beats in 15 seconds?  (Note: not all patients can do this)       Not asked 8. CAUSE: What do you think is causing the dizziness?     I'm dehydrated.   I'm drinking water and stuff.   Food is not my best friend right now.    I don't eat a lot.   9. RECURRENT SYMPTOM: Have you had dizziness before? If Yes, ask: When was the last time? What happened that time?     Yes a long time ago. I'm having a lot of diarrhea.    I took some Imodium and it helped for a day.   Then when I eat it goes through me.   I wearing a diaper because I don't want to clean up the floor.     10. OTHER SYMPTOMS: Do you have any other symptoms? (e.g., fever, chest pain, vomiting, diarrhea, bleeding)       See above 11. PREGNANCY: Is there any chance you  are pregnant? When was your last menstrual period?       N/A due to age  Protocols used: Dizziness - Lightheadedness-A-AH

## 2023-05-03 NOTE — Telephone Encounter (Signed)
  Chief Complaint: Falling, dizziness, diarrhea, dehydration   Seen ED 04/23/2023 at [facility].   Not admitted and not given IV fluids per pt.  They did a CT scan of my head and x rays but said everything was ok.   They didn't give me any IV fluids which is what I feel I needed.  I think I'm dehydrated from the gabapentin  I've been taking for a long time.   I'm having bad diarrhea.  That's one of the side effects of it after being on it per pt. Symptoms: Lightheaded, dizzy, falling, diarrhea, weakness for the last couple of weeks.  Talking slow.   She went to the ED because she hit the back of her head during one of her falls on 04/23/2023 at [facility].    Frequency: Last couple of weeks. Pertinent Negatives: Patient denies fever, abd pain, recent illnesses like URI.    She thinks she is having diarrhea so bad from taking gabapentin  for so long. Disposition: [] ED /[] Urgent Care (no appt availability in office) / [x] Appointment(In office/virtual)/ []  [facility]/ [] Home Care/ [] Refused Recommended Disposition /[] [facility]/ []  Follow-up with PCP Additional Notes: Appt made with Dr. Vicci for today at 12:00 Noon with understanding she is to return to the ED if she passes out. Falls again or gets worse.   She was agreeable to this plan.   Her daughter can take to to Dr. Tomasa today.   I cautioned her not to drive due to be so weak and having dizziness.

## 2023-05-03 NOTE — ED Triage Notes (Addendum)
 Pt coming from PCP. C/o dizziness. Was seen @[facility]  3 weeks ago for dehydration and dizziness, Pt reports it has not relieved since then. Pt reports she has fallen 6x since the dizziness started and has hit her head multiple times. Denies blood thinners. Pt has a noticeable bump to back of head. Denies vision changes, HA. GCS 15. PMH: DM. Pt also reports puffiness in face x1 month and Pt is following neurology for it

## 2023-05-03 NOTE — ED Notes (Signed)
 Positive for orthostatic vital sign changes.   L 114/88 100% 92 16  S 102/82 100% 109 16  Standing 82/63 100% 116 16

## 2023-05-03 NOTE — H&P (Signed)
 History and Physical    Lisa Galvan FMW:[id number] DOB: [birth year] DOA: 05/03/2023  Referring MD/NP/PA:   PCP: Vicci Duwaine SQUIBB, DO   Patient coming from:  The patient is coming from home.     Chief Complaint: Dizziness, multiple fall, diarrhea, urge to urinate  HPI: Lisa Galvan is a 63 y.o. female with medical history significant of hypertension, hyperlipidemia, diabetes mellitus, depression, anemia, OSA, chronic pain due to spinal cord radiculopathy, who presents with dizziness, multiple fall, diarrhea, urged to urinate.  Patient states that she has diarrhea in the past 2 weeks, 1-3 times of loose stool bowel movement every day.  No nausea vomiting or abdominal pain.  No fever or chills.  She has been feeling dizzy and lightheaded.  She has had multiple falls, at least 1 time in the head.  No loss of consciousness.  No unilateral numbness or tingling in extremities.  No facial droop or slurred speech.  No ear ringing.  Does not feel room spinning around her.  Patient does not have chest pain, cough or SOB.  She has urge to urinate, no dysuria or burning on urination.  Due to multiple fall, patient had outpatient CT scan of head and neck 04/23/2023 which were negative for acute injury.  Data reviewed independently and ED Course: pt was found to have WBC 2.9, GFR> 60, urinalysis (hazy appearance, moderate amount leukocyte, few bacteria, WBC> 50, squamous cell 25-50), potassium 3.0, magnesium  1.4, phosphorus 2.9, troponin 4.  CT head negative for acute intracranial abnormalities.  MRI for brain negative for stroke.  Chest x-ray negative.  Patient is placed MedSurg bed for observation.   EKG: I have personally reviewed.  Sinus rhythm, QTc 417, low voltage, nonspecific T wave change.   Review of Systems:   General: no fevers, chills, no body weight gain, has poor appetite, has fatigue HEENT: no blurry vision, hearing changes or sore throat Respiratory: no dyspnea, coughing, wheezing CV: no  chest pain, no palpitations GI: no nausea, vomiting, abdominal pain, has diarrhea, no constipation GU: no dysuria, burning on urination, increased urinary frequency, hematuria  Ext: no leg edema Neuro: no unilateral weakness, numbness, or tingling, no vision change or hearing loss.  Has tenderness in the lightheadedness.  Has multiple falls. Skin: no rash, no skin tear. MSK: No muscle spasm, no deformity, no limitation of range of movement in spin Heme: No easy bruising.  Travel history: No recent long distant travel.   Allergy:  Allergies  Allergen Reactions   Benazepril Swelling    Face and mouth swelling.   Ivp Dye [Iodinated Contrast Media] Anaphylaxis    Patient unsure of reaction but in case of reaction d/t shellfish allergy   Lidoderm  [Lidocaine ] Rash    02-01-2021: Patient had lidocaine  patch applied to chest at sight of injury and she experienced a rash, whelping, and swelling. Resolved after removing the patch. Likely to be the adhesive as she can tolerate injectable lidocaine  w/o problems.   Lyrica  [Pregabalin ] Swelling    Face/ mouth swelled up balloon   Shellfish Allergy Anaphylaxis and Swelling    Betadine on skin is okay   Shellfish-Derived Products Anaphylaxis   Trulicity  [Dulaglutide ] Swelling    Face and mouth swelled up   Ace Inhibitors Swelling    Past Medical History:  Diagnosis Date   Allergy    Anemia    Arthritis    spine   Bilateral leg edema    Chest pain 12/15/2014   Chronic sciatica 12/10/2014   COVID-19  2023   DDD (degenerative disc disease), lumbar    Essential hypertension 12/10/2014   Gastroesophageal reflux disease without esophagitis    Mixed hyperlipidemia    Neuropathy    Obesity (BMI 30.0-34.9) 12/10/2014   Pneumonia 1968   Sleep apnea    new cpap   Type 2 diabetes mellitus without complication (HCC) 12/10/2014   Umbilical hernia     Past Surgical History:  Procedure Laterality Date   ABDOMINAL HYSTERECTOMY     ANKLE  CLOSED REDUCTION Right 04/15/2022   Procedure: CLOSED REDUCTION ANKLE;  Surgeon: Silva Juliene SAUNDERS, DPM;  Location: [facility];  Service: Podiatry;  Laterality: Right;   CARPAL TUNNEL RELEASE Right 11/07/2022   Procedure: CARPAL TUNNEL RELEASE ENDOSCOPIC;  Surgeon: Edie Norleen PARAS, MD;  Location: [facility];  Service: Orthopedics;  Laterality: Right;  3rd case   CARPAL TUNNEL RELEASE Left 02/07/2023   Procedure: CARPAL TUNNEL RELEASE ENDOSCOPIC;  Surgeon: Edie Norleen PARAS, MD;  Location: [facility];  Service: Orthopedics;  Laterality: Left;  3rd case   COLONOSCOPY WITH PROPOFOL  N/A 03/30/2019   Procedure: COLONOSCOPY WITH PROPOFOL ;  Surgeon: Unk Lisa Skiff, MD;  Location: [facility];  Service: Gastroenterology;  Laterality: N/A;   ESOPHAGOGASTRODUODENOSCOPY (EGD) WITH PROPOFOL  N/A 03/12/2017   Procedure: ESOPHAGOGASTRODUODENOSCOPY (EGD) WITH PROPOFOL ;  Surgeon: Unk Lisa Skiff, MD;  Location: [facility];  Service: Gastroenterology;  Laterality: N/A;   ESOPHAGOGASTRODUODENOSCOPY (EGD) WITH PROPOFOL  N/A 03/30/2019   Procedure: ESOPHAGOGASTRODUODENOSCOPY (EGD) WITH PROPOFOL ;  Surgeon: Unk Lisa Skiff, MD;  Location: [facility];  Service: Gastroenterology;  Laterality: N/A;   HERNIA REPAIR     INCISIONAL HERNIA REPAIR N/A 11/29/2020   Procedure: HERNIA REPAIR INCISIONAL, open;  Surgeon: Jordis Laneta FALCON, MD;  Location: [facility];  Service: General;  Laterality: N/A;   ORIF ANKLE FRACTURE Left 07/30/2019   Procedure: OPEN REDUCTION INTERNAL FIXATION (ORIF) OF LEFT BIMALLEOLAR ANKLE FRACTURE;  Surgeon: Lisa Priest, MD;  Location: [facility];  Service: Orthopedics;  Laterality: Left;  Diabetic - oral meds   ORIF ANKLE FRACTURE Right 05/03/2022   Procedure: OPEN REDUCTION INTERNAL FIXATION (ORIF) ANKLE FRACTURE - TRIMALLEOLAR WITH INTERNAL FIXATION;  Surgeon: Lennie Barter, DPM;  Location: [facility];  Service: Podiatry;  Laterality: Right;   SUPRACERVICAL ABDOMINAL HYSTERECTOMY      UMBILICAL HERNIA REPAIR N/A 05/01/2017   Procedure: HERNIA REPAIR UMBILICAL ADULT;  Surgeon: Nicholaus Selinda Birmingham, MD;  Location: [facility];  Service: General;  Laterality: N/A;   XI ROBOTIC ASSISTED VENTRAL HERNIA N/A 10/06/2019   Procedure: XI ROBOTIC ASSISTED VENTRAL HERNIA;  Surgeon: Jordis Laneta FALCON, MD;  Location: [facility];  Service: General;  Laterality: N/A;    Social History:  reports that she has never smoked. She has never used smokeless tobacco. She reports current alcohol use of about 5.0 standard drinks of alcohol per week. She reports that she does not use drugs.  Family History:  Family History  Problem Relation Age of Onset   Diabetes Mother    Cancer Mother        breast and stomach   Breast cancer Mother 34   Dementia Mother    Heart disease Father    Kidney disease Father        dialysis   Cancer Father        colon   Diabetes Father    Hypertension Sister    Hypertension Brother    Hypertension Daughter    Congestive Heart Failure Maternal Grandmother    Diabetes Maternal Grandmother  Hypertension Maternal Grandmother      Prior to Admission medications   Medication Sig Start Date End Date Taking? Authorizing Provider  Accu-Chek Softclix Lancets lancets TEST TWICE DAILY 10/14/20   Vicci Bouchard P, DO  acetaminophen  (TYLENOL ) 650 MG CR tablet Take 650 mg by mouth daily as needed for pain.    [provider]  aspirin  EC 81 MG tablet Take 81 mg by mouth daily. Swallow whole.    [provider]  atorvastatin  (LIPITOR) 20 MG tablet Take 1 tablet (20 mg total) by mouth daily. TAKE 1 TABLET(20 MG) BY MOUTH DAILY 12/05/22   Johnson, Megan P, DO  Cholecalciferol  (VITAMIN D -3) 5000 units TABS Take 5,000 Units by mouth daily.    [provider]  clotrimazole -betamethasone  (LOTRISONE ) cream Apply 1 Application topically daily. 12/05/22   Johnson, Megan P, DO  diclofenac  Sodium (VOLTAREN ) 1 % GEL Apply 4 g topically 4 (four) times daily. 02/21/23    Johnson, Megan P, DO  DULoxetine  (CYMBALTA ) 60 MG capsule Take 1 capsule (60 mg total) by mouth daily. Home med. 12/05/22   Johnson, Megan P, DO  EPINEPHrine  0.3 mg/0.3 mL IJ SOAJ injection Inject 0.3 mg into the muscle as needed for anaphylaxis. 09/04/22   Johnson, Megan P, DO  fexofenadine  (ALLERGY RELIEF) 180 MG tablet Take 1 tablet (180 mg total) by mouth daily. Home med. 02/07/23   Poggi, Norleen PARAS, MD  gabapentin  (NEURONTIN ) 300 MG capsule Takes 1 cap. In am; 1 in afternoon and 2 cap. At bedtime 04/04/23   Vicci Bouchard P, DO  hydrocortisone  cream 0.5 % Apply 1 Application topically 2 (two) times daily. For face, until rash clears. Max 14 days at a time. 04/10/23   Herold Hadassah SQUIBB, MD  magnesium  gluconate (MAGONATE) 500 MG tablet Take 500 mg by mouth 2 (two) times daily.    [provider]  metFORMIN  (GLUCOPHAGE ) 500 MG tablet Take 1 tablet (500 mg total) by mouth daily with breakfast. 03/07/23   Vicci Bouchard P, DO  metoprolol  succinate (TOPROL -XL) 25 MG 24 hr tablet Take 1 tablet (25 mg total) by mouth daily. 12/05/22   Johnson, Megan P, DO  omeprazole  (PRILOSEC) 40 MG capsule TAKE 1 CAPSULE(40 MG) BY MOUTH BID 12/05/22   Johnson, Megan P, DO  sucralfate  (CARAFATE ) 1 g tablet Take 1 tablet (1 g total) by mouth 4 (four) times daily as needed. Home med. 06/06/22   Vicci Bouchard P, DO  triamcinolone  (KENALOG ) 0.025 % ointment Apply 1 Application topically 2 (two) times daily. Until rash resolves. Max 2 weeks at a time. 04/10/23   Herold Hadassah SQUIBB, MD  vitamin B-12 (CYANOCOBALAMIN ) 1000 MCG tablet Take 1,000 mcg by mouth 2 (two) times daily.    [provider]    Physical Exam: Vitals:   05/03/23 1446 05/03/23 1825 05/03/23 1921 05/03/23 1922  BP:  103/76  127/79  Pulse:  93  89  Resp:  18  16  Temp:    97.6 F (36.4 C)  TempSrc:    Oral  SpO2:  100% 100% 100%  Weight: 66.7 kg     Height: 5' 2 (1.575 m)      General: Not in acute distress.  Dry mucous membrane HEENT:        Eyes: PERRL, EOMI, no jaundice       ENT: No discharge from the ears and nose, no pharynx injection, no tonsillar enlargement.        Neck: No JVD, no bruit, no mass  felt. Heme: No neck lymph node enlargement. Cardiac: S1/S2, RRR, No murmurs, No gallops or rubs. Respiratory: No rales, wheezing, rhonchi or rubs. GI: Soft, nondistended, nontender, no rebound pain, no organomegaly, BS present. GU: No hematuria Ext: No pitting leg edema bilaterally. 1+DP/PT pulse bilaterally. Musculoskeletal: No joint deformities, No joint redness or warmth, no limitation of ROM in spin. Skin: No rashes.  Neuro: Alert, oriented X3, cranial nerves II-XII grossly intact, moves all extremities normally. Muscle strength 5/5 in all extremities, sensation to light touch intact. Brachial reflex 2+ bilaterally. Knee reflex 1+ bilaterally. Negative Babinski's sign. Normal finger to nose test. Psych: Patient is not psychotic, no suicidal or hemocidal ideation.  Labs on Admission: I have personally reviewed following labs and imaging studies  CBC: Recent Labs  Lab 05/03/23 1451  WBC 2.9*  HGB 12.3  HCT 35.9*  MCV 91.1  PLT 168   Basic Metabolic Panel: Recent Labs  Lab 05/03/23 1451  NA 139  K 3.0*  CL 102  CO2 23  GLUCOSE 71  BUN 7*  CREATININE 0.79  CALCIUM  8.8*  MG 1.4*  PHOS 2.9   GFR: Estimated Creatinine Clearance: 63.6 mL/min (by C-G formula based on SCr of 0.79 mg/dL). Liver Function Tests: Recent Labs  Lab 05/03/23 1451  AST 103*  ALT 44  ALKPHOS 88  BILITOT 1.8*  PROT 5.6*  ALBUMIN 2.9*   No results for input(s): LIPASE, AMYLASE in the last 168 hours. No results for input(s): AMMONIA in the last 168 hours. Coagulation Profile: No results for input(s): INR, PROTIME in the last 168 hours. Cardiac Enzymes: No results for input(s): CKTOTAL, CKMB, CKMBINDEX, TROPONINI in the last 168 hours. BNP (last 3 results) No results for input(s): PROBNP in the last 8760  hours. HbA1C: No results for input(s): HGBA1C in the last 72 hours. CBG: No results for input(s): GLUCAP in the last 168 hours. Lipid Profile: No results for input(s): CHOL, HDL, LDLCALC, TRIG, CHOLHDL, LDLDIRECT in the last 72 hours. Thyroid  Function Tests: Recent Labs    05/03/23 1451  TSH 2.434  FREET4 1.06   Anemia Panel: No results for input(s): VITAMINB12, FOLATE, FERRITIN, TIBC, IRON, RETICCTPCT in the last 72 hours. Urine analysis:    Component Value Date/Time   COLORURINE AMBER (A) 05/03/2023 1451   APPEARANCEUR HAZY (A) 05/03/2023 1451   APPEARANCEUR Cloudy (A) 05/03/2023 1314   LABSPEC 1.019 05/03/2023 1451   PHURINE 5.0 05/03/2023 1451   GLUCOSEU NEGATIVE 05/03/2023 1451   HGBUR NEGATIVE 05/03/2023 1451   BILIRUBINUR SMALL (A) 05/03/2023 1451   BILIRUBINUR Negative 05/03/2023 1314   KETONESUR 20 (A) 05/03/2023 1451   PROTEINUR 30 (A) 05/03/2023 1451   PROTEINUR Negative 05/03/2023 1314   NITRITE NEGATIVE 05/03/2023 1451   NITRITE Negative 05/03/2023 1314   LEUKOCYTESUR MODERATE (A) 05/03/2023 1451   LEUKOCYTESUR Trace (A) 05/03/2023 1314   Sepsis Labs: @LABRCNTIP (procalcitonin:4,lacticidven:4) ) Recent Results (from the past 240 hours)  Microscopic Examination     Status: Abnormal   Collection Time: 05/03/23  1:14 PM   Urine  Result Value Ref Range Status   WBC, UA 0-5 0 - 5 /hpf Final   RBC, Urine None seen 0 - 2 /hpf Final   Epithelial Cells (non renal) 0-10 0 - 10 /hpf Final   Mucus, UA Present (A) Not Estab. Final   Bacteria, UA None seen None seen/Few Final     Radiological Exams on Admission:   Assessment/Plan Principal Problem:   UTI (urinary tract infection) Active Problems:  Dizziness   Fall at home, initial encounter   Hyperlipidemia   Essential hypertension   Abnormal LFTs   Diabetes mellitus without complication (HCC)   Hypomagnesemia   Hypokalemia   Diarrhea   Depression   Assessment and  Plan:  Possible UTI (urinary tract infection): pt report having urge to urinate, urinalysis positive, but with history, cell contamination.  -Placed on MedSurg bed for observation -Started with Rocephin  -Follow-up urine culture  Dizziness and fall at home, initial encounter: Patient has dizziness and lightheadedness with multiple falls.  CT of head and MRI of the brain negative.  Likely due to volume depletion secondary to diarrhea -Check orthostatic vital sign -IV fluid: 1 L LR, then 75 cc/h -Fall precaution -PT/OT  Hyperlipidemia: -lipitor  Essential hypertension -IV hydralazine  as needed -Metoprolol   Abnormal LFTs: ALP 88, AST 103, ALT 44, total bilirubin 1.8, direct bilirubin 0.4.  Etiology is not clear. -Avoid using Tylenol  -Check hepatitis panel  Diabetes mellitus without complication [facility]): Recent A1c 6.6, well-controlled.  Patient taking Ozempic  and metformin  -Sliding scale insulin   Hypomagnesemia and Hypokalemia: Potassium 3.0, magnesium  1.4.  Phosphorus 2.9 -Repleted both magnesium  and potassium  Diarrhea -IV fluid as above -Check C. difficile and GI pathogen panel  Depression -Continue home medications        DVT ppx: SQ Lovenox   Code Status: Full code   Family Communication: not done, no family member is at bed side.         Disposition Plan:  Anticipate discharge back to previous environment  Consults called:  none  Admission status and Level of care: Med-Surg:    for obs     Dispo: The patient is from: Home              Anticipated d/c is to: Home              Anticipated d/c date is: 1 day              Patient currently is not medically stable to d/c.    Severity of Illness:  The appropriate patient status for this patient is OBSERVATION. Observation status is judged to be reasonable and necessary in order to provide the required intensity of service to ensure the patient's safety. The patient's presenting symptoms, physical exam  findings, and initial radiographic and laboratory data in the context of their medical condition is felt to place them at decreased risk for further clinical deterioration. Furthermore, it is anticipated that the patient will be medically stable for discharge from the hospital within 2 midnights of admission.        Date of Service 05/03/2023    Caleb Exon [facility]   If 7PM-7AM, please contact night-coverage [email or web address] 05/03/2023, 9:19 PM

## 2023-05-03 NOTE — Telephone Encounter (Signed)
 Patient has appt today 05/03/2023 at 12:00 PM

## 2023-05-03 NOTE — Progress Notes (Signed)
 BP 136/76   Pulse 88   Wt 147 lb (66.7 kg)   SpO2 96%   BMI 27.78 kg/m    Subjective:    Patient ID: Lisa Galvan, female    DOB: [birth year], 63 y.o.   MRN: [id number]  HPI: Lisa Galvan is a 63 y.o. female  Chief Complaint  Patient presents with   Diarrhea    Patient says she feels she has been having Diarrhea, as a side effect of long term use of her Gabapentin  prescription. Patient says she first noticed it about a couple of weeks ago and would like to discuss with provider at today's visit.    Dizziness   Fall    Patient says she went to the ER on 04/23/23. Patient says she fell two weeks prior to the ER visit. Patient daughter says she had a knot on her head. Patient says she has been having these dizziness spells when she first wakes up and says it feels her feet will give out. Patient was taken to [facility] and imaging was done and everything was fine. Patient was dehydrated and told to increase fluid intake. Patient daughter says her sugar level were low as of this morning of 56.    Hypertension    Patient daughter says the Neurologist has change the patient's blood pressure medication due to low blood pressure readings and now they have noticed elevation in her blood pressure. Patient says she didn't take it this morning and it was elevated. Patient daughter would like to discuss to regulate patient blood pressure.    2 weeks ago started with anal leaking when she's sleeping. She will have soft stools leak out when she is sleeping- she'll have it during the day occasionally, but less likely. She has no awareness that she is going to have a BM and then just wakes up when she's had a BM. She's having a BM every night. She is not having any BMs during the day. She denies any diarrhea, notes that it's more soft stools. She does note that she's feeling a bit more gassy, but her stomach had otherwise been OK.   DIZZINESS- has had 6 falls in the past 10 days, she has hit her head at least 4  times. She has fallen backwards and hit her head. She has not lost consciousness. She has been dizzy since she fell. She has not had any PT coming out right now. She is anxious about doing home health because of cost.  Duration: about 2 weeks Description of symptoms: lightheaded Duration of episode:  constant Dizziness frequency: constant Provoking factors: none Aggravating factors:  none Triggered by rolling over in bed: no Triggered by bending over: no Aggravated by head movement: no Aggravated by exertion, coughing, loud noises: no Recent head injury: yes Recent or current viral symptoms: no History of vasovagal episodes: yes Nausea: no Vomiting: no Tinnitus: no Hearing loss: no Aural fullness: no Headache: yes Photophobia/phonophobia: yes Unsteady gait: yes Postural instability: yes Diplopia, dysarthria, dysphagia or weakness: no Related to exertion: no Pallor: no Diaphoresis: no Dyspnea: no Chest pain: no   Relevant past medical, surgical, family and social history reviewed and updated as indicated. Interim medical history since our last visit reviewed. Allergies and medications reviewed and updated.  Review of Systems  Constitutional: Negative.   Respiratory: Negative.    Cardiovascular: Negative.   Gastrointestinal:  Positive for abdominal distention. Negative for abdominal pain, anal bleeding, blood in stool, constipation, diarrhea, nausea, rectal pain and  vomiting.  Neurological:  Positive for dizziness, weakness and light-headedness. Negative for tremors, seizures, syncope, facial asymmetry, speech difficulty, numbness and headaches.  Hematological: Negative.   Psychiatric/Behavioral: Negative.      Per HPI unless specifically indicated above     Objective:    BP 136/76   Pulse 88   Wt 147 lb (66.7 kg)   SpO2 96%   BMI 27.78 kg/m   Wt Readings from Last 3 Encounters:  05/03/23 147 lb (66.7 kg)  05/03/23 147 lb (66.7 kg)  04/10/23 154 lb 9.6 oz (70.1  kg)    Physical Exam Vitals and nursing note reviewed.  Constitutional:      General: She is not in acute distress.    Appearance: Normal appearance. She is not ill-appearing, toxic-appearing or diaphoretic.  HENT:     Head: Normocephalic and atraumatic.     Right Ear: External ear normal.     Left Ear: External ear normal.     Nose: Nose normal.     Mouth/Throat:     Mouth: Mucous membranes are moist.     Pharynx: Oropharynx is clear.  Eyes:     General: No scleral icterus.       Right eye: No discharge.        Left eye: No discharge.     Extraocular Movements: Extraocular movements intact.     Conjunctiva/sclera: Conjunctivae normal.     Pupils: Pupils are equal, round, and reactive to light.  Cardiovascular:     Rate and Rhythm: Normal rate and regular rhythm.     Pulses: Normal pulses.     Heart sounds: Normal heart sounds. No murmur heard.    No friction rub. No gallop.  Pulmonary:     Effort: Pulmonary effort is normal. No respiratory distress.     Breath sounds: Normal breath sounds. No stridor. No wheezing, rhonchi or rales.  Chest:     Chest wall: No tenderness.  Musculoskeletal:        General: Normal range of motion.     Cervical back: Normal range of motion and neck supple.  Skin:    General: Skin is warm and dry.     Capillary Refill: Capillary refill takes less than 2 seconds.     Coloration: Skin is not jaundiced or pale.     Findings: No bruising, erythema, lesion or rash.  Neurological:     General: No focal deficit present.     Mental Status: She is alert and oriented to person, place, and time. Mental status is at baseline.     Motor: Weakness (difficulty standing) present.     Gait: Gait abnormal.  Psychiatric:        Mood and Affect: Mood normal.        Behavior: Behavior normal.        Thought Content: Thought content normal.        Judgment: Judgment normal.     Results for orders placed or performed in visit on 05/03/23  Microscopic  Examination   Collection Time: 05/03/23  1:14 PM   Urine  Result Value Ref Range   WBC, UA 0-5 0 - 5 /hpf   RBC, Urine None seen 0 - 2 /hpf   Epithelial Cells (non renal) 0-10 0 - 10 /hpf   Mucus, UA Present (A) Not Estab.   Bacteria, UA None seen None seen/Few  Urinalysis, Routine w reflex microscopic   Collection Time: 05/03/23  1:14 PM  Result Value Ref Range  Specific Gravity, UA 1.010 1.005 - 1.030   pH, UA 6.0 5.0 - 7.5   Color, UA Amber (A) Yellow   Appearance Ur Cloudy (A) Clear   Leukocytes,UA Trace (A) Negative   Protein,UA Negative Negative/Trace   Glucose, UA Negative Negative   Ketones, UA 1+ (A) Negative   RBC, UA Negative Negative   Bilirubin, UA Negative Negative   Urobilinogen, Ur 2.0 (H) 0.2 - 1.0 mg/dL   Nitrite, UA Negative Negative   Microscopic Examination See below:   CBC with Differential/Platelet   Collection Time: 05/03/23  1:21 PM  Result Value Ref Range   WBC 2.7 (L) 3.4 - 10.8 x10E3/uL   RBC 4.17 3.77 - 5.28 x10E6/uL   Hemoglobin 13.0 11.1 - 15.9 g/dL   Hematocrit 62.0 65.9 - 46.6 %   MCV 91 79 - 97 fL   MCH 31.2 26.6 - 33.0 pg   MCHC 34.3 31.5 - 35.7 g/dL   RDW 84.3 (H) 88.2 - 84.5 %   Platelets 207 150 - 450 x10E3/uL   Neutrophils 37 Not Estab. %   Lymphs 56 Not Estab. %   Monocytes 6 Not Estab. %   Eos 1 Not Estab. %   Basos 0 Not Estab. %   Neutrophils Absolute 1.0 (L) 1.4 - 7.0 x10E3/uL   Lymphocytes Absolute 1.5 0.7 - 3.1 x10E3/uL   Monocytes Absolute 0.2 0.1 - 0.9 x10E3/uL   EOS (ABSOLUTE) 0.0 0.0 - 0.4 x10E3/uL   Basophils Absolute 0.0 0.0 - 0.2 x10E3/uL   Immature Granulocytes 0 Not Estab. %   Immature Grans (Abs) 0.0 0.0 - 0.1 x10E3/uL   Hematology Comments: Note:   Comprehensive metabolic panel   Collection Time: 05/03/23  1:21 PM  Result Value Ref Range   Glucose 66 (L) 70 - 99 mg/dL   BUN 6 (L) 8 - 27 mg/dL   Creatinine, Ser 9.30 0.57 - 1.00 mg/dL   eGFR 97 >40 fO/fpw/8.26   BUN/Creatinine Ratio 9 (L) 12 - 28    Sodium 139 134 - 144 mmol/L   Potassium 3.5 3.5 - 5.2 mmol/L   Chloride 98 96 - 106 mmol/L   CO2 20 20 - 29 mmol/L   Calcium  9.0 8.7 - 10.3 mg/dL   Total Protein 5.3 (L) 6.0 - 8.5 g/dL   Albumin 3.2 (L) 3.9 - 4.9 g/dL   Globulin, Total 2.1 1.5 - 4.5 g/dL   Bilirubin Total 1.0 0.0 - 1.2 mg/dL   Alkaline Phosphatase 126 (H) 44 - 121 IU/L   AST 115 (H) 0 - 40 IU/L   ALT 45 (H) 0 - 32 IU/L  VITAMIN D  25 Hydroxy (Vit-D Deficiency, Fractures)   Collection Time: 05/03/23  1:21 PM  Result Value Ref Range   Vit D, 25-Hydroxy 55.8 30.0 - 100.0 ng/mL  TSH   Collection Time: 05/03/23  1:21 PM  Result Value Ref Range   TSH 2.380 0.450 - 4.500 uIU/mL      Assessment & Plan:   Problem List Items Addressed This Visit       Endocrine   Type 2 diabetes mellitus with diabetic neuropathy, unspecified (HCC)   Hypoglycemic this AM. Will stop ozempic  and recheck next week.         Other   Dizziness - Primary   Not doing well. Concern for post-concussive syndrome. Will check labs including UA. Await results. Treat as needed.       Relevant Orders   Ambulatory referral to Home Health   CBC  with Differential/Platelet (Completed)   Comprehensive metabolic panel (Completed)   VITAMIN D  25 Hydroxy (Vit-D Deficiency, Fractures) (Completed)   TSH (Completed)   Urinalysis, Routine w reflex microscopic (Completed)   Other Visit Diagnoses       Incontinence of feces, unspecified fecal incontinence type       Concern that ozempic  is worsening this- will check labs and stop ozempic . Rechek next week.   Relevant Orders   Ambulatory referral to Home Health   CBC with Differential/Platelet (Completed)   Comprehensive metabolic panel (Completed)   VITAMIN D  25 Hydroxy (Vit-D Deficiency, Fractures) (Completed)   Fecal occult blood, imunochemical([facility])     Balance problem       Worsening. Discussed importance of using walker, and if unsteady using wheelchair. Seeing neurology. Will get her  home health ASAP- ordered today.   Relevant Orders   Ambulatory referral to Home Health   CBC with Differential/Platelet (Completed)   Comprehensive metabolic panel (Completed)   VITAMIN D  25 Hydroxy (Vit-D Deficiency, Fractures) (Completed)   TSH (Completed)   Urinalysis, Routine w reflex microscopic (Completed)     Recurrent falls       Worsening. Discussed importance of using walker, and if unsteady using wheelchair. Seeing neurology. Will get her home health ASAP- ordered today.   Relevant Orders   Ambulatory referral to Home Health   CBC with Differential/Platelet (Completed)   Comprehensive metabolic panel (Completed)   VITAMIN D  25 Hydroxy (Vit-D Deficiency, Fractures) (Completed)   TSH (Completed)        Follow up plan: Return 2 week, leave on for Feb on schedule.  >72minutes spent with patient and daughter today

## 2023-05-04 DIAGNOSIS — R531 Weakness: Secondary | ICD-10-CM

## 2023-05-04 LAB — TSH: TSH: 2.38 u[IU]/mL (ref 0.450–4.500)

## 2023-05-04 LAB — COMPREHENSIVE METABOLIC PANEL
ALT: 45 [IU]/L — ABNORMAL HIGH (ref 0–32)
ALT: 37 U/L (ref 0–44)
AST: 115 [IU]/L — ABNORMAL HIGH (ref 0–40)
AST: 76 U/L — ABNORMAL HIGH (ref 15–41)
Albumin: 3.2 g/dL — ABNORMAL LOW (ref 3.9–4.9)
Albumin: 2.3 g/dL — ABNORMAL LOW (ref 3.5–5.0)
Alkaline Phosphatase: 126 [IU]/L — ABNORMAL HIGH (ref 44–121)
Alkaline Phosphatase: 80 U/L (ref 38–126)
Anion gap: 8 (ref 5–15)
BUN/Creatinine Ratio: 9 — ABNORMAL LOW (ref 12–28)
BUN: 6 mg/dL — ABNORMAL LOW (ref 8–27)
BUN: 6 mg/dL — ABNORMAL LOW (ref 8–23)
Bilirubin Total: 1 mg/dL (ref 0.0–1.2)
CO2: 20 mmol/L (ref 20–29)
CO2: 24 mmol/L (ref 22–32)
Calcium: 9 mg/dL (ref 8.7–10.3)
Calcium: 7.9 mg/dL — ABNORMAL LOW (ref 8.9–10.3)
Chloride: 98 mmol/L (ref 96–106)
Chloride: 102 mmol/L (ref 98–111)
Creatinine, Ser: 0.69 mg/dL (ref 0.57–1.00)
Creatinine, Ser: 0.64 mg/dL (ref 0.44–1.00)
GFR, Estimated: >60 mL/min (ref >60)
Globulin, Total: 2.1 g/dL (ref 1.5–4.5)
Glucose, Bld: 91 mg/dL (ref 70–99)
Glucose: 66 mg/dL — ABNORMAL LOW (ref 70–99)
Potassium: 3.5 mmol/L (ref 3.5–5.2)
Potassium: 2.9 mmol/L — ABNORMAL LOW (ref 3.5–5.1)
Sodium: 139 mmol/L (ref 134–144)
Sodium: 134 mmol/L — ABNORMAL LOW (ref 135–145)
Total Bilirubin: 1 mg/dL (ref 0.0–1.2)
Total Protein: 5.3 g/dL — ABNORMAL LOW (ref 6.0–8.5)
Total Protein: 4.6 g/dL — ABNORMAL LOW (ref 6.5–8.1)
eGFR: 97 mL/min/{1.73_m2} (ref >59)

## 2023-05-04 LAB — CBC WITH DIFFERENTIAL/PLATELET
Basophils Absolute: 0 10*3/uL (ref 0.0–0.2)
Basos: 0 %
EOS (ABSOLUTE): 0 10*3/uL (ref 0.0–0.4)
Eos: 1 %
Hematocrit: 37.9 % (ref 34.0–46.6)
Hemoglobin: 13 g/dL (ref 11.1–15.9)
Immature Grans (Abs): 0 10*3/uL (ref 0.0–0.1)
Immature Granulocytes: 0 %
Lymphocytes Absolute: 1.5 10*3/uL (ref 0.7–3.1)
Lymphs: 56 %
MCH: 31.2 pg (ref 26.6–33.0)
MCHC: 34.3 g/dL (ref 31.5–35.7)
MCV: 91 fL (ref 79–97)
Monocytes Absolute: 0.2 10*3/uL (ref 0.1–0.9)
Monocytes: 6 %
Neutrophils Absolute: 1 10*3/uL — ABNORMAL LOW (ref 1.4–7.0)
Neutrophils: 37 %
Platelets: 207 10*3/uL (ref 150–450)
RBC: 4.17 x10E6/uL (ref 3.77–5.28)
RDW: 15.6 % — ABNORMAL HIGH (ref 11.7–15.4)
WBC: 2.7 10*3/uL — ABNORMAL LOW (ref 3.4–10.8)

## 2023-05-04 LAB — HEPATITIS PANEL, ACUTE
HCV Ab: NONREACTIVE
Hep A IgM: NONREACTIVE
Hep B C IgM: NONREACTIVE
Hepatitis B Surface Ag: NONREACTIVE

## 2023-05-04 LAB — CBG MONITORING, ED
Glucose-Capillary: 77 mg/dL (ref 70–99)
Glucose-Capillary: 108 mg/dL — ABNORMAL HIGH (ref 70–99)
Glucose-Capillary: 143 mg/dL — ABNORMAL HIGH (ref 70–99)

## 2023-05-04 LAB — CBC
HCT: 29.6 % — ABNORMAL LOW (ref 36.0–46.0)
Hemoglobin: 10.4 g/dL — ABNORMAL LOW (ref 12.0–15.0)
MCH: 31.6 pg (ref 26.0–34.0)
MCHC: 35.1 g/dL (ref 30.0–36.0)
MCV: 90 fL (ref 80.0–100.0)
Platelets: 166 10*3/uL (ref 150–400)
RBC: 3.29 MIL/uL — ABNORMAL LOW (ref 3.87–5.11)
RDW: 16.9 % — ABNORMAL HIGH (ref 11.5–15.5)
WBC: 2.7 10*3/uL — ABNORMAL LOW (ref 4.0–10.5)
nRBC: 0 % (ref 0.0–0.2)

## 2023-05-04 LAB — BASIC METABOLIC PANEL
Anion gap: 7 (ref 5–15)
BUN: 6 mg/dL — ABNORMAL LOW (ref 8–23)
CO2: 25 mmol/L (ref 22–32)
Calcium: 8 mg/dL — ABNORMAL LOW (ref 8.9–10.3)
Chloride: 101 mmol/L (ref 98–111)
Creatinine, Ser: 0.74 mg/dL (ref 0.44–1.00)
GFR, Estimated: >60 mL/min (ref >60)
Glucose, Bld: 150 mg/dL — ABNORMAL HIGH (ref 70–99)
Potassium: 3.6 mmol/L (ref 3.5–5.1)
Sodium: 133 mmol/L — ABNORMAL LOW (ref 135–145)

## 2023-05-04 LAB — MAGNESIUM: Magnesium: 2.9 mg/dL — ABNORMAL HIGH (ref 1.7–2.4)

## 2023-05-04 LAB — VITAMIN D 25 HYDROXY (VIT D DEFICIENCY, FRACTURES): Vit D, 25-Hydroxy: 55.8 ng/mL (ref 30.0–100.0)

## 2023-05-04 LAB — HIV ANTIBODY (ROUTINE TESTING W REFLEX): HIV Screen 4th Generation wRfx: NONREACTIVE

## 2023-05-04 MED ORDER — MAGNESIUM SULFATE 4 GM/100ML IV SOLN
4.0000 g | Freq: Once | INTRAVENOUS | Status: AC
  Administered 2023-05-04: 4 g via INTRAVENOUS
  Filled 2023-05-04: qty 100

## 2023-05-04 MED ORDER — POTASSIUM CHLORIDE CRYS ER 20 MEQ PO TBCR
40.0000 meq | EXTENDED_RELEASE_TABLET | ORAL | Status: AC
  Administered 2023-05-04 (×2): 40 meq via ORAL
  Filled 2023-05-04 (×2): qty 2

## 2023-05-04 MED ORDER — LACTATED RINGERS IV BOLUS
500.0000 mL | Freq: Once | INTRAVENOUS | Status: AC
  Administered 2023-05-04: 500 mL via INTRAVENOUS

## 2023-05-04 MED ORDER — CEFUROXIME AXETIL 500 MG PO TABS: 500.0000 mg | ORAL_TABLET | Freq: Two times a day (BID) | ORAL | 0 refills | Status: AC
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
## 2023-05-04 NOTE — Evaluation (Signed)
 Physical Therapy Evaluation Patient Details Name: Lisa Galvan MRN: [id number] DOB: Mar 23, 1961 Today's Date: 05/04/2023  History of Present Illness  Pt admitted for UTI with complaints of freqent falls and dizziness. History includes HTN, HLD, DM, depression, and has been having increased diarrhea for past few days.  Clinical Impression  Pt is a pleasant 63 year old female who was admitted for UTI with multiple falls. Pt performs bed mobility/transfers with cga using RW; however is unable to tolerate ambulation secondary to dizziness. Orthostatics taken and pt symptomatic despite weight shifting/marching in place. Not safe to ambulate in room at this time. Pt demonstrates deficits with strength/mobility/balance. Would benefit from skilled PT to address above deficits and promote optimal return to PLOF. Pt will continue to receive skilled PT services while admitted and will defer to TOC/care team for updates regarding disposition planning.  Orthostatic VS for the past 24 hrs (Last 3 readings):  BP- Lying Pulse- Lying BP- Sitting Pulse- Sitting BP- Standing at 0 minutes Pulse- Standing at 0 minutes  05/04/23 0952 (!) 135/110 93 (!) 119/94 102 115/85 (!) 45         If plan is discharge home, recommend the following: A lot of help with walking and/or transfers;Help with stairs or ramp for entrance;Assist for transportation   Can travel by private vehicle        Equipment Recommendations None recommended by PT  Recommendations for Other Services       Functional Status Assessment Patient has had a recent decline in their functional status and demonstrates the ability to make significant improvements in function in a reasonable and predictable amount of time.     Precautions / Restrictions Precautions Precautions: Fall Restrictions Weight Bearing Restrictions Per Provider Order: No      Mobility  Bed Mobility Overal bed mobility: Needs Assistance Bed Mobility: Supine to Sit      Supine to sit: Contact guard     General bed mobility comments: takes increased time to perform    Transfers Overall transfer level: Needs assistance Equipment used: Rolling walker (2 wheels) Transfers: Sit to/from Stand Sit to Stand: Contact guard assist           General transfer comment: once standing, braces legs against bed secondary to dizziness. Able to march in place for 30 seconds, however dizziness increases and unable to progress mobility to gait. Pt suddently sat at EOB    Ambulation/Gait               General Gait Details: unable secondary to dizziness  Stairs            Wheelchair Mobility     Tilt Bed    Modified Rankin (Stroke Patients Only)       Balance Overall balance assessment: Needs assistance, History of Falls Sitting-balance support: Bilateral upper extremity supported, Feet supported Sitting balance-Leahy Scale: Good     Standing balance support: Bilateral upper extremity supported Standing balance-Leahy Scale: Poor                               Pertinent Vitals/Pain Pain Assessment Pain Assessment: No/denies pain    Home Living Family/patient expects to be discharged to:: Private residence Living Arrangements: Alone Available Help at Discharge: Family;Available PRN/intermittently (daughter lives close by) Type of Home: House Home Access: Level entry       Home Layout: One level Home Equipment: Rollator (4 wheels);Wheelchair - Oncologist (2 wheels)  Additional Comments: reports daughter can come stay with her if needed as she doesn't want to go to SNF    Prior Function Prior Level of Function : Needs assist             Mobility Comments: daughter helps as needed. has been using rollator for past few weeks ADLs Comments: hasn't been driving due to dizziness     Extremity/Trunk Assessment   Upper Extremity Assessment Upper Extremity Assessment: Generalized  weakness    Lower Extremity Assessment Lower Extremity Assessment: Generalized weakness (B LE grossly 4/5)       Communication   Communication Communication: No apparent difficulties  Cognition Arousal: Alert Behavior During Therapy: WFL for tasks assessed/performed Overall Cognitive Status: Within Functional Limits for tasks assessed                                 General Comments: pleasant and reports shes feeling better than before        General Comments      Exercises Other Exercises Other Exercises: orthostatics taken- see vital flowsheet for details   Assessment/Plan    PT Assessment Patient needs continued PT services  PT Problem List Decreased strength;Decreased activity tolerance;Decreased balance;Decreased mobility       PT Treatment Interventions DME instruction;Gait training;Therapeutic exercise;Balance training    PT Goals (Current goals can be found in the Care Plan section)  Acute Rehab PT Goals Patient Stated Goal: to go home with daughter support PT Goal Formulation: With patient Time For Goal Achievement: 05/18/23 Potential to Achieve Goals: Good    Frequency Min 1X/week     Co-evaluation               AM-PAC PT 6 Clicks Mobility  Outcome Measure Help needed turning from your back to your side while in a flat bed without using bedrails?: A Little Help needed moving from lying on your back to sitting on the side of a flat bed without using bedrails?: A Little Help needed moving to and from a bed to a chair (including a wheelchair)?: A Lot Help needed standing up from a chair using your arms (e.g., wheelchair or bedside chair)?: A Little Help needed to walk in hospital room?: A Lot Help needed climbing 3-5 steps with a railing? : A Lot 6 Click Score: 15    End of Session   Activity Tolerance: Treatment limited secondary to medical complications (Comment) Patient left: in bed;with bed alarm set Nurse Communication:  Mobility status PT Visit Diagnosis: Unsteadiness on feet (R26.81);Repeated falls (R29.6);Muscle weakness (generalized) (M62.81);History of falling (Z91.81);Difficulty in walking, not elsewhere classified (R26.2);Dizziness and giddiness (R42)    Time: 8981-8956 PT Time Calculation (min) (ACUTE ONLY): 25 min   Charges:   PT Evaluation $PT Eval Moderate Complexity: 1 Mod PT Treatments $Therapeutic Activity: 8-22 mins PT General Charges $$ ACUTE PT VISIT: 1 Visit         Corean Dade, PT, DPT, GCS [phone]   Marlana Mckowen 05/04/2023, 12:46 PM

## 2023-05-04 NOTE — Progress Notes (Signed)
 [facility] PROGRESS NOTE   Lisa Galvan FMW:[id number] DOB: 1960/09/07 DOA: 05/03/2023  PCP: Vicci Duwaine SQUIBB, DO  Brief History: 63 y.o. female with medical history significant of hypertension, hyperlipidemia, diabetes mellitus, depression, anemia, OSA, chronic pain due to spinal cord radiculopathy, who presented with dizziness, multiple fall, diarrhea, urge to urinate.  She was found to have abnormal UA.  Found to have electrolyte abnormalities.  She was hospitalized for further management.  Consultants: None  Procedures: None    Subjective/Interval History: Patient reports that she is feeling better.  Denies any diarrhea.  Feels stronger.  Per nursing staff she is still unsteady on her feet.  Patient wants to go home later today.    Assessment/Plan:  Near syncope CT head and MRI brain negative.  Symptoms thought to be due to dehydration from recent diarrhea.  No focal neurological deficits noted on examination.  Do not suspect cardiac etiology. Will wait for PT and OT evaluation today.  Possible UTI Patient reported having an urge to urinate.  UA showed hazy urine with moderate leukocytes but with several squamous epithelial cells suggesting contamination.  Due to her symptoms she was empirically placed on ceftriaxone .  Can be transitioned to oral at discharge.  There was no more than 3 to 4 days treatment.  Hypokalemia and hypomagnesemia Supplemented.  Potassium level again noted to be low this morning at 2.9.  Will give additional doses.  Recheck later today. Give additional magnesium  sulfate as well.  Abnormal LFTs Likely due to acute illness.  Improvement noted.  Diarrhea Appears to have resolved.  Stool studies but she has not had any diarrhea since she has been in the hospital.  Essential hypertension Blood pressure is reasonably well-controlled this morning.  Noted to be on metoprolol .  Hyperlipidemia  Continue Statin.  Diabetes mellitus type 2 without  complications  Recent HbA1c 6.6. SSI.  Normocytic anemia Likely dilutional drop in hemoglobin.  No evidence of overt bleeding.  Leukopenia Likely due to acute illness.  DVT Prophylaxis: Lovenox  Code Status: Full code Family Communication: Discussed with patient Disposition Plan: Mobilize.  Replace electrolytes.  Recheck labs today.  Possible discharge later today.  Status is: Observation The patient remains OBS appropriate and may or may not d/c before 2 midnights.      Medications: Scheduled:  aspirin  EC  81 mg Oral Daily   atorvastatin   20 mg Oral Daily   DULoxetine   60 mg Oral Daily   enoxaparin  (LOVENOX ) injection  40 mg Subcutaneous Q24H   gabapentin   300 mg Oral Daily   And   gabapentin   600 mg Oral QHS   insulin  aspart  0-5 Units Subcutaneous QHS   insulin  aspart  0-9 Units Subcutaneous TID WC   metoprolol  succinate  25 mg Oral Daily   pantoprazole   40 mg Oral Daily   potassium chloride   40 mEq Oral Q4H   Continuous:  sodium chloride  75 mL/hr at 05/03/23 2132   cefTRIAXone  (ROCEPHIN )  IV     lactated ringers      magnesium  sulfate bolus IVPB 4 g (05/04/23 1018)   PRN:hydrALAZINE , ibuprofen , ondansetron  (ZOFRAN ) IV  Antibiotics: Anti-infectives (From admission, onward)    Start     Dose/Rate Route Frequency Ordered Stop   05/04/23 2200  cefTRIAXone  (ROCEPHIN ) 1 g in sodium chloride  0.9 % 100 mL IVPB        1 g 200 mL/hr over 30 Minutes Intravenous Every 24 hours 05/03/23 2115     05/03/23 1815  cefTRIAXone  (  ROCEPHIN ) 1 g in sodium chloride  0.9 % 100 mL IVPB        1 g 200 mL/hr over 30 Minutes Intravenous  Once 05/03/23 1807 05/03/23 2205       Objective:  Vital Signs  Vitals:   05/04/23 0200 05/04/23 0557 05/04/23 0700 05/04/23 0952  BP: (!) 120/91 129/84 (!) 125/90 115/86  Pulse: 89 87 83 96  Resp:  20  16  Temp:  98.1 F (36.7 C)  98.1 F (36.7 C)  TempSrc:  Oral  Oral  SpO2: 98% 100% 99% 100%  Weight:      Height:         Intake/Output Summary (Last 24 hours) at 05/04/2023 1152 Last data filed at 05/03/2023 2237 Gross per 24 hour  Intake 150 ml  Output --  Net 150 ml   Filed Weights   05/03/23 1446  Weight: 66.7 kg    General appearance: Awake alert.  In no distress Resp: Clear to auscultation bilaterally.  Normal effort Cardio: S1-S2 is normal regular.  No S3-S4.  No rubs murmurs or bruit GI: Abdomen is soft.  Nontender nondistended.  Bowel sounds are present normal.  No masses organomegaly Extremities: No edema.  Full range of motion of lower extremities. Neurologic: Alert and oriented x3.  No focal neurological deficits.    Lab Results:  Data Reviewed: I have personally reviewed following labs and reports of the imaging studies  CBC: Recent Labs  Lab 05/03/23 1321 05/03/23 1451 05/04/23 0544  WBC 2.7* 2.9* 2.7*  NEUTROABS 1.0*  --   --   HGB 13.0 12.3 10.4*  HCT 37.9 35.9* 29.6*  MCV 91 91.1 90.0  PLT 207 168 166    Basic Metabolic Panel: Recent Labs  Lab 05/03/23 1321 05/03/23 1451 05/04/23 0544  NA 139 139 134*  K 3.5 3.0* 2.9*  CL 98 102 102  CO2 20 23 24   GLUCOSE 66* 71 91  BUN 6* 7* 6*  CREATININE 0.69 0.79 0.64  CALCIUM  9.0 8.8* 7.9*  MG  --  1.4*  --   PHOS  --  2.9  --     GFR: Estimated Creatinine Clearance: 63.6 mL/min (by C-G formula based on SCr of 0.64 mg/dL).  Liver Function Tests: Recent Labs  Lab 05/03/23 1321 05/03/23 1451 05/04/23 0544  AST 115* 103* 76*  ALT 45* 44 37  ALKPHOS 126* 88 80  BILITOT 1.0 1.8* 1.0  PROT 5.3* 5.6* 4.6*  ALBUMIN 3.2* 2.9* 2.3*    CBG: Recent Labs  Lab 05/03/23 2238 05/04/23 0822  GLUCAP 91 77     Thyroid  Function Tests: Recent Labs    05/03/23 1451  TSH 2.434  FREET4 1.06     Recent Results (from the past 240 hours)  Microscopic Examination     Status: Abnormal   Collection Time: 05/03/23  1:14 PM   Urine  Result Value Ref Range Status   WBC, UA 0-5 0 - 5 /hpf Final   RBC, Urine None  seen 0 - 2 /hpf Final   Epithelial Cells (non renal) 0-10 0 - 10 /hpf Final   Mucus, UA Present (A) Not Estab. Final   Bacteria, UA None seen None seen/Few Final      Radiology Studies: MR BRAIN WO CONTRAST Result Date: 05/03/2023 CLINICAL DATA:  Dizziness, falls EXAM: MRI HEAD WITHOUT CONTRAST TECHNIQUE: Multiplanar, multiecho pulse sequences of the brain and surrounding structures were obtained without intravenous contrast. COMPARISON:  12/27/2021 MRI head, correlation is  also made with 05/03/2023 CT head FINDINGS: Brain: No restricted diffusion to suggest acute or subacute infarct. No acute hemorrhage, mass, mass effect, or midline shift. No hydrocephalus or extra-axial collection. Pituitary and craniocervical junction within normal limits. No hemosiderin deposition to suggest remote hemorrhage. Scattered T2 hyperintense signal in the periventricular white matter, likely the sequela of minimal chronic small vessel ischemic disease. Normal cerebral volume. Vascular: Normal arterial flow voids. Skull and upper cervical spine: Normal marrow signal. Sinuses/Orbits: Clear paranasal sinuses. No acute finding in the orbits. Other: The mastoid air cells are well aerated. IMPRESSION: No acute intracranial process. No evidence of acute or subacute infarct. Electronically Signed   By: Donald Campion M.D.   On: 05/03/2023 19:23   CT Head Wo Contrast Result Date: 05/03/2023 CLINICAL DATA:  Head trauma, focal neuro findings (Age 69-64y) EXAM: CT HEAD WITHOUT CONTRAST TECHNIQUE: Contiguous axial images were obtained from the base of the skull through the vertex without intravenous contrast. RADIATION DOSE REDUCTION: This exam was performed according to the departmental dose-optimization program which includes automated exposure control, adjustment of the mA and/or kV according to patient size and/or use of iterative reconstruction technique. COMPARISON:  CT head 12/11/2021. FINDINGS: Brain: No evidence of acute  infarction, hemorrhage, hydrocephalus, extra-axial collection or mass lesion/mass effect. Vascular: No hyperdense vessel. Skull: No acute fracture. Sinuses/Orbits: No acute finding. Other: No mastoid effusions. IMPRESSION: No evidence of acute intracranial abnormality. Electronically Signed   By: Gilmore GORMAN Molt M.D.   On: 05/03/2023 16:39   DG Chest 2 View Result Date: 05/03/2023 CLINICAL DATA:  Weakness EXAM: CHEST - 2 VIEW COMPARISON:  04/14/2022 FINDINGS: The heart size and mediastinal contours are within normal limits. Both lungs are clear. The visualized skeletal structures are unremarkable. IMPRESSION: No active cardiopulmonary disease. Electronically Signed   By: Luke Bun M.D.   On: 05/03/2023 16:36       LOS: 0 days   Lisa Galvan  [facility] Pager on [email or web address]  05/04/2023, 11:52 AM

## 2023-05-04 NOTE — Evaluation (Signed)
 Occupational Therapy Evaluation Patient Details Name: Lisa Galvan MRN: [id number] DOB: 31-Jul-1960 Today's Date: 05/04/2023   History of Present Illness Pt admitted for UTI with complaints of freqent falls and dizziness. History includes HTN, HLD, DM, depression, and has been having increased diarrhea for past few days.   Clinical Impression   Pt. Presents with dizziness, orthostatic hypotension,  weakness, and limited functional mobility which hinder her ability to complete ADL, and IADL tasks. Pt. Resides at home alone. Pt. Endorses multiple falls at home. Per chart: as many as 6 over the last 10 days. Pt. has a supportive daughter who lives 7 min. Away.  Pt. Was independent with morning ADLs. Pt.'s daughter assists with IADL care needs. Pt. Reports that her daughter recently  brought her w/c out of the attic. Pt. Stopped driving due the dizziness. Anticipate Set-up supervision UE ADLs, MinA LE ADLs seated, however dizziness, and orthostatic hypotension will limit overall functional performance, and safety during ADLs in standing. Recommend performing these tasks while seated until dizziness resolves. Pt.'s BP  seated 115/85 HR 83 bpms. Pt. will  benefit from OT services for ADL training, A/E training, and pt. education about worked simplification strategies, home modification, and DME.       If plan is discharge home, recommend the following: Supervision due to cognitive status;A lot of help with walking and/or transfers;A little help with bathing/dressing/bathroom    Functional Status Assessment  Patient has had a recent decline in their functional status and demonstrates the ability to make significant improvements in function in a reasonable and predictable amount of time.  Equipment Recommendations       Recommendations for Other Services       Precautions / Restrictions Precautions Precautions: Fall Restrictions Weight Bearing Restrictions Per Provider Order: No      Mobility  Bed Mobility Overal bed mobility: Needs Assistance Bed Mobility: Supine to Sit     Supine to sit: Contact guard     General bed mobility comments: takes increased time to perform    Transfers Overall transfer level: Needs assistance Equipment used: Rolling walker (2 wheels) Transfers: Sit to/from Stand Sit to Stand: Contact guard assist           General transfer comment: Mobility per PT report      Balance                                           ADL either performed or assessed with clinical judgement   ADL Overall ADL's : Needs assistance/impaired                 Upper Body Dressing : Set up;Supervision/safety   Lower Body Dressing: Set up;Minimal assistance                       Vision Baseline Vision/History: 1 Wears glasses Patient Visual Report: No change from baseline       Perception         Praxis         Pertinent Vitals/Pain Pain Assessment Pain Assessment: No/denies pain     Extremity/Trunk Assessment Upper Extremity Assessment Upper Extremity Assessment: Generalized weakness   Lower Extremity Assessment Lower Extremity Assessment: Generalized weakness (B LE grossly 4/5)       Communication Communication Communication: No apparent difficulties Cueing Techniques: Verbal cues;Tactile cues;Visual cues   Cognition Arousal: Alert  Behavior During Therapy: WFL for tasks assessed/performed Overall Cognitive Status: Within Functional Limits for tasks assessed                                       General Comments       Exercises     Shoulder Instructions      Home Living Family/patient expects to be discharged to:: Private residence Living Arrangements: Alone Available Help at Discharge: Family;Available PRN/intermittently Type of Home: House Home Access: Level entry     Home Layout: One level     Bathroom Shower/Tub: Producer, Television/film/video: Handicapped  height     Home Equipment: Rollator (4 wheels);Wheelchair - Oncologist (2 wheels)   Additional Comments: reports daughter can come stay with her if needed as she doesn't want to go to SNF      Prior Functioning/Environment Prior Level of Function : Needs assist             Mobility Comments: daughter helps as needed. has been using rollator for past few weeks ADLs Comments: Pt. rpeorts being independent with morning ADLs. Pt. daughter assists with IADLs. Pt. hasn't been driving due to dizziness        OT Problem List:        OT Treatment/Interventions: Self-care/ADL training;Therapeutic exercise;DME and/or AE instruction;Therapeutic activities    OT Goals(Current goals can be found in the care plan section) Acute Rehab OT Goals Patient Stated Goal: To return home OT Goal Formulation: With patient Time For Goal Achievement: 05/18/23 Potential to Achieve Goals: Good  OT Frequency: Min 2X/week    Co-evaluation              AM-PAC OT 6 Clicks Daily Activity     Outcome Measure Help from another person eating meals?: None Help from another person taking care of personal grooming?: None Help from another person toileting, which includes using toliet, bedpan, or urinal?: A Little Help from another person bathing (including washing, rinsing, drying)?: A Little Help from another person to put on and taking off regular upper body clothing?: A Little Help from another person to put on and taking off regular lower body clothing?: A Little 6 Click Score: 20   End of Session    Activity Tolerance: Patient tolerated treatment well Patient left: in bed;with bed alarm set;with call bell/phone within reach  OT Visit Diagnosis: Muscle weakness (generalized) (M62.81);History of falling (Z91.81)                Time: 8946-8886 OT Time Calculation (min): 20 min Charges:  OT General Charges $OT Visit: 1 Visit OT Evaluation $OT Eval Moderate  Complexity: 1 Mod Lisa Otter, MS, OTR/L   Lisa Galvan 05/04/2023, 1:01 PM

## 2023-05-04 NOTE — ED Notes (Signed)
 This tech assisted pt to the bedside commode. Pt unsteady and required moderate assistance. Pt voided at this time. Pt back in bed and resting comfortably.

## 2023-05-04 NOTE — Discharge Summary (Signed)
 [facility]  Physician Discharge Summary   Patient ID: Lisa Galvan MRN: [id number] DOB/AGE: 1960/09/25 63 y.o.  Admit date: 05/03/2023 Discharge date: 05/04/2023    PCP: Vicci Duwaine SQUIBB, DO  DISCHARGE DIAGNOSES:    UTI (urinary tract infection) Near syncope due to dehydration   Fall at home, initial encounter   Hyperlipidemia   Essential hypertension   Abnormal LFTs   Diabetes mellitus without complication (HCC)   Hypomagnesemia   Hypokalemia   Diarrhea   Depression   RECOMMENDATIONS FOR OUTPATIENT FOLLOW UP: Patient to follow-up with PCP Home health may need to be arranged by PCP since social worker was unable to do this here prior to discharge   Home Health: PT and OT Equipment/Devices: None  CODE STATUS: Full code  DISCHARGE CONDITION: fair  Diet recommendation: As before  INITIAL HISTORY: 63 y.o. female with medical history significant of hypertension, hyperlipidemia, diabetes mellitus, depression, anemia, OSA, chronic pain due to spinal cord radiculopathy, who presented with dizziness, multiple fall, diarrhea, urge to urinate.  She was found to have abnormal UA.  Found to have electrolyte abnormalities.  She was hospitalized for further management.   HOSPITAL COURSE:   Near syncope CT head and MRI brain negative.  Symptoms thought to be due to dehydration from recent diarrhea.  No focal neurological deficits noted on examination.  Do not suspect cardiac etiology. Seen by physical therapy.  Noted to be orthostatic.  Additional IV fluids ordered.  However patient was adamant about going home today.  She mentioned that her daughter will be available to help her.  Even though she was advised to stay another night she remains adamant about going home.  Hence she was discharged.   Possible UTI Patient reported having an urge to urinate.  UA showed hazy urine with moderate leukocytes but with several squamous epithelial cells suggesting contamination.  Due to  her symptoms she was empirically placed on ceftriaxone .  Will be discharged on a few days of oral antibiotics   Hypokalemia and hypomagnesemia Supplemented.  Levels were rechecked and noted to be normal this afternoon.   Abnormal LFTs Likely due to acute illness.  Improvement noted.   Diarrhea Appears to have resolved.     Essential hypertension Blood pressure is reasonably well-controlled this morning.  Noted to be on metoprolol .   Hyperlipidemia  Continue Statin.   Diabetes mellitus type 2 without complications  Recent HbA1c 6.6.   Normocytic anemia Likely dilutional drop in hemoglobin.  No evidence of overt bleeding.   Leukopenia Likely due to acute illness.  Patient is stable.  Wants to go home even though another day's stay was recommended.  She is otherwise stable for discharge.  We tried to arrange home health, but due to the weekend social worker was unable to do so.  This can be addressed by PCP.   PERTINENT LABS:  The results of significant diagnostics from this hospitalization (including imaging, microbiology, ancillary and laboratory) are listed below for reference.    Microbiology: Recent Results (from the past 240 hours)  Microscopic Examination     Status: Abnormal   Collection Time: 05/03/23  1:14 PM   Urine  Result Value Ref Range Status   WBC, UA 0-5 0 - 5 /hpf Final   RBC, Urine None seen 0 - 2 /hpf Final   Epithelial Cells (non renal) 0-10 0 - 10 /hpf Final   Mucus, UA Present (A) Not Estab. Final   Bacteria, UA None seen None seen/Few Final  Urine Culture     Status: None (Preliminary result)   Collection Time: 05/03/23  2:51 PM   Specimen: Urine, Clean Catch  Result Value Ref Range Status   Specimen Description   Final    URINE, CLEAN CATCH Performed at [facility], [address], [place], [place] [address]    Special Requests   Final    NONE Performed at [facility], [address], [place], [place] [address]     Culture   Final    CULTURE REINCUBATED FOR BETTER GROWTH Performed at [facility], [address], [place], [place] [address]    Report Status PENDING  Incomplete     Labs:   Basic Metabolic Panel: Recent Labs  Lab 05/03/23 1321 05/03/23 1451 05/04/23 0544 05/04/23 1446  NA 139 139 134* 133*  K 3.5 3.0* 2.9* 3.6  CL 98 102 102 101  CO2 20 23 24 25   GLUCOSE 66* 71 91 150*  BUN 6* 7* 6* 6*  CREATININE 0.69 0.79 0.64 0.74  CALCIUM  9.0 8.8* 7.9* 8.0*  MG  --  1.4*  --  2.9*  PHOS  --  2.9  --   --    Liver Function Tests: Recent Labs  Lab 05/03/23 1321 05/03/23 1451 05/04/23 0544  AST 115* 103* 76*  ALT 45* 44 37  ALKPHOS 126* 88 80  BILITOT 1.0 1.8* 1.0  PROT 5.3* 5.6* 4.6*  ALBUMIN 3.2* 2.9* 2.3*    CBC: Recent Labs  Lab 05/03/23 1321 05/03/23 1451 05/04/23 0544  WBC 2.7* 2.9* 2.7*  NEUTROABS 1.0*  --   --   HGB 13.0 12.3 10.4*  HCT 37.9 35.9* 29.6*  MCV 91 91.1 90.0  PLT 207 168 166     CBG: Recent Labs  Lab 05/03/23 2238 05/04/23 0822 05/04/23 1158 05/04/23 1636  GLUCAP 91 77 108* 143*     IMAGING STUDIES MR BRAIN WO CONTRAST Result Date: 05/03/2023 CLINICAL DATA:  Dizziness, falls EXAM: MRI HEAD WITHOUT CONTRAST TECHNIQUE: Multiplanar, multiecho pulse sequences of the brain and surrounding structures were obtained without intravenous contrast. COMPARISON:  12/27/2021 MRI head, correlation is also made with 05/03/2023 CT head FINDINGS: Brain: No restricted diffusion to suggest acute or subacute infarct. No acute hemorrhage, mass, mass effect, or midline shift. No hydrocephalus or extra-axial collection. Pituitary and craniocervical junction within normal limits. No hemosiderin deposition to suggest remote hemorrhage. Scattered T2 hyperintense signal in the periventricular white matter, likely the sequela of minimal chronic small vessel ischemic disease. Normal cerebral volume. Vascular: Normal arterial flow voids. Skull and upper cervical  spine: Normal marrow signal. Sinuses/Orbits: Clear paranasal sinuses. No acute finding in the orbits. Other: The mastoid air cells are well aerated. IMPRESSION: No acute intracranial process. No evidence of acute or subacute infarct. Electronically Signed   By: Donald Campion M.D.   On: 05/03/2023 19:23   CT Head Wo Contrast Result Date: 05/03/2023 CLINICAL DATA:  Head trauma, focal neuro findings (Age 18-64y) EXAM: CT HEAD WITHOUT CONTRAST TECHNIQUE: Contiguous axial images were obtained from the base of the skull through the vertex without intravenous contrast. RADIATION DOSE REDUCTION: This exam was performed according to the departmental dose-optimization program which includes automated exposure control, adjustment of the mA and/or kV according to patient size and/or use of iterative reconstruction technique. COMPARISON:  CT head 12/11/2021. FINDINGS: Brain: No evidence of acute infarction, hemorrhage, hydrocephalus, extra-axial collection or mass lesion/mass effect. Vascular: No hyperdense vessel. Skull: No acute fracture. Sinuses/Orbits:  No acute finding. Other: No mastoid effusions. IMPRESSION: No evidence of acute intracranial abnormality. Electronically Signed   By: Gilmore GORMAN Molt M.D.   On: 05/03/2023 16:39   DG Chest 2 View Result Date: 05/03/2023 CLINICAL DATA:  Weakness EXAM: CHEST - 2 VIEW COMPARISON:  04/14/2022 FINDINGS: The heart size and mediastinal contours are within normal limits. Both lungs are clear. The visualized skeletal structures are unremarkable. IMPRESSION: No active cardiopulmonary disease. Electronically Signed   By: Luke Bun M.D.   On: 05/03/2023 16:36    DISCHARGE EXAMINATION: Vitals:   05/04/23 0700 05/04/23 0952 05/04/23 1425 05/04/23 1719  BP: (!) 125/90 115/86 99/77 (!) 108/91  Pulse: 83 96 87 84  Resp:  16 18 19   Temp:  98.1 F (36.7 C) 97.9 F (36.6 C)   TempSrc:  Oral Oral   SpO2: 99% 100% 98% 100%  Weight:      Height:       Please see progress  note from earlier today  DISPOSITION: Home with family  Discharge Instructions     Call MD for:  difficulty breathing, headache or visual disturbances   Complete by: As directed    Call MD for:  extreme fatigue   Complete by: As directed    Call MD for:  hives   Complete by: As directed    Call MD for:  persistant dizziness or light-headedness   Complete by: As directed    Call MD for:  persistant nausea and vomiting   Complete by: As directed    Call MD for:  severe uncontrolled pain   Complete by: As directed    Call MD for:  temperature >100.4   Complete by: As directed    Diet - low sodium heart healthy   Complete by: As directed    Discharge instructions   Complete by: As directed    Please stay well hydrated for next few days. Follow up with your PCP. Please have your PCP set up home health services for PT and OT.  You were cared for by a hospitalist during your hospital stay. If you have any questions about your discharge medications or the care you received while you were in the hospital after you are discharged, you can call the unit and asked to speak with the hospitalist on call if the hospitalist that took care of you is not available. Once you are discharged, your primary care physician will handle any further medical issues. Please note that NO REFILLS for any discharge medications will be authorized once you are discharged, as it is imperative that you return to your primary care physician (or establish a relationship with a primary care physician if you do not have one) for your aftercare needs so that they can reassess your need for medications and monitor your lab values. If you do not have a primary care physician, you can call [phone] for a physician referral.   Increase activity slowly   Complete by: As directed          Allergies as of 05/04/2023       Reactions   Benazepril Swelling   Face and mouth swelling.   Ivp Dye [iodinated Contrast Media]  Anaphylaxis   Patient unsure of reaction but in case of reaction d/t shellfish allergy   Lidoderm  [lidocaine ] Rash   02-01-2021: Patient had lidocaine  patch applied to chest at sight of injury and she experienced a rash, whelping, and swelling. Resolved after removing the patch. Likely to be  the adhesive as she can tolerate injectable lidocaine  w/o problems.   Lyrica  [pregabalin ] Swelling   Face/ mouth swelled up balloon   Shellfish Allergy Anaphylaxis, Swelling   Betadine on skin is okay   Shellfish-derived Products Anaphylaxis   Trulicity  [dulaglutide ] Swelling   Face and mouth swelled up   Ace Inhibitors Swelling        Medication List     STOP taking these medications    hydrocortisone  cream 0.5 %   magnesium  gluconate 500 MG tablet Commonly known as: MAGONATE   sucralfate  1 g tablet Commonly known as: CARAFATE    triamcinolone  0.025 % ointment Commonly known as: KENALOG        TAKE these medications    Accu-Chek Softclix Lancets lancets TEST TWICE DAILY   acetaminophen  650 MG CR tablet Commonly known as: TYLENOL  Take 650 mg by mouth daily as needed for pain.   aspirin  EC 81 MG tablet Take 81 mg by mouth daily. Swallow whole.   atorvastatin  20 MG tablet Commonly known as: LIPITOR Take 1 tablet (20 mg total) by mouth daily. TAKE 1 TABLET(20 MG) BY MOUTH DAILY   cefUROXime  500 MG tablet Commonly known as: CEFTIN  Take 1 tablet (500 mg total) by mouth 2 (two) times daily with a meal for 3 days.   clotrimazole -betamethasone  cream Commonly known as: LOTRISONE  Apply 1 Application topically daily.   cyanocobalamin  1000 MCG tablet Commonly known as: VITAMIN B12 Take 1,000 mcg by mouth 2 (two) times daily.   diclofenac  Sodium 1 % Gel Commonly known as: Voltaren  Apply 4 g topically 4 (four) times daily.   DULoxetine  60 MG capsule Commonly known as: CYMBALTA  Take 1 capsule (60 mg total) by mouth daily. Home med.   EPINEPHrine  0.3 mg/0.3 mL Soaj  injection Commonly known as: EPI-PEN Inject 0.3 mg into the muscle as needed for anaphylaxis.   fexofenadine  180 MG tablet Commonly known as: Allergy Relief Take 1 tablet (180 mg total) by mouth daily. Home med.   gabapentin  300 MG capsule Commonly known as: NEURONTIN  Takes 1 cap. In am; 1 in afternoon and 2 cap. At bedtime   Magnesium  Oxide -Mg Supplement 500 MG Tabs Take 500 mg by mouth daily.   metFORMIN  500 MG tablet Commonly known as: GLUCOPHAGE  Take 1 tablet (500 mg total) by mouth daily with breakfast.   metoprolol  succinate 25 MG 24 hr tablet Commonly known as: TOPROL -XL Take 1 tablet (25 mg total) by mouth daily.   omeprazole  40 MG capsule Commonly known as: PRILOSEC TAKE 1 CAPSULE(40 MG) BY MOUTH BID   Vitamin D -3 125 MCG (5000 UT) Tabs Take 5,000 Units by mouth daily.          Follow-up Information     Vicci Bouchard P, DO. Schedule an appointment as soon as possible for a visit in 1 week(s).   Specialty: Family Medicine Why: post hospitalization follow up Contact information: [address] [place] [address] [phone]                 TOTAL DISCHARGE TIME: 35 minutes  Taras Rask Verdene  [facility] Pager on [email or web address]  05/05/2023, 10:55 AM

## 2023-05-05 ENCOUNTER — Encounter: Payer: Self-pay | Admitting: Family Medicine

## 2023-05-05 NOTE — Assessment & Plan Note (Signed)
 Not doing well. Concern for post-concussive syndrome. Will check labs including UA. Await results. Treat as needed.

## 2023-05-05 NOTE — Assessment & Plan Note (Signed)
 Hypoglycemic this AM. Will stop ozempic and recheck next week.

## 2023-05-06 DIAGNOSIS — Z7984 Long term (current) use of oral hypoglycemic drugs: Secondary | ICD-10-CM | POA: Insufficient documentation

## 2023-05-06 LAB — URINE CULTURE: Culture: >=100000 — AB

## 2023-05-09 ENCOUNTER — Other Ambulatory Visit: Payer: Self-pay | Admitting: Family Medicine

## 2023-05-09 DIAGNOSIS — M792 Neuralgia and neuritis, unspecified: Secondary | ICD-10-CM

## 2023-05-10 ENCOUNTER — Other Ambulatory Visit: Payer: Self-pay | Admitting: Family Medicine

## 2023-05-10 DIAGNOSIS — M792 Neuralgia and neuritis, unspecified: Secondary | ICD-10-CM

## 2023-05-10 NOTE — Telephone Encounter (Signed)
Duplicate request, medication was refilled 05/10/23 for 90 days.  Requested Prescriptions  Pending Prescriptions Disp Refills   DULoxetine (CYMBALTA) 60 MG capsule 90 capsule 1    Sig: Take 1 capsule (60 mg total) by mouth daily. Home med.     Psychiatry: Antidepressants - SNRI - duloxetine Failed - 05/10/2023 11:13 AM      Failed - Last BP in normal range    BP Readings from Last 1 Encounters:  05/04/23 (!) 108/91         Passed - Cr in normal range and within 360 days    Creatinine, Ser  Date Value Ref Range Status  05/04/2023 0.74 0.44 - 1.00 mg/dL Final         Passed - eGFR is 30 or above and within 360 days    GFR calc Af Amer  Date Value Ref Range Status  02/18/2020 89 >59 mL/min/1.73 Final    Comment:    **In accordance with recommendations from the NKF-ASN Task force,**   [facility] is in the process of updating its eGFR calculation to the   2021 CKD-EPI creatinine equation that estimates kidney function   without a race variable.    GFR, Estimated  Date Value Ref Range Status  05/04/2023 >60 >60 mL/min Final    Comment:    (NOTE) Calculated using the CKD-EPI Creatinine Equation (2021)    eGFR  Date Value Ref Range Status  05/03/2023 97 >59 mL/min/1.73 Final         Passed - Completed PHQ-2 or PHQ-9 in the last 360 days      Passed - Valid encounter within last 6 months    Recent Outpatient Visits           1 week ago Dizziness   [place] [facility] [place], Megan P, DO   1 month ago Bedbug bite, initial encounter   [place] [facility] Jackolyn Confer, MD   2 months ago Type 2 diabetes mellitus with diabetic neuropathy, without long-term current use of insulin (HCC)   [place] [facility] [place], [place], DO   2 months ago Balance problem   [place] [facility] [place], [place], DO   2 months ago Oral thrush   [place] [facility] [place], Sherran Needs, NP        Future Appointments             In 1 week Laural Benes, Oralia Rud, DO  [facility], [facility]   In 1 month [place], Oralia Rud, DO  [facility], [facility]

## 2023-05-10 NOTE — Telephone Encounter (Signed)
Requested Prescriptions  Pending Prescriptions Disp Refills   DULoxetine (CYMBALTA) 60 MG capsule [Pharmacy Med Name: DULOXETINE DR 60MG  CAPSULES] 90 capsule 1    Sig: TAKE 1 CAPSULE BY MOUTH EVERY DAY     Psychiatry: Antidepressants - SNRI - duloxetine Failed - 05/10/2023 10:27 AM      Failed - Last BP in normal range    BP Readings from Last 1 Encounters:  05/04/23 (!) 108/91         Passed - Cr in normal range and within 360 days    Creatinine, Ser  Date Value Ref Range Status  05/04/2023 0.74 0.44 - 1.00 mg/dL Final         Passed - eGFR is 30 or above and within 360 days    GFR calc Af Amer  Date Value Ref Range Status  02/18/2020 89 >59 mL/min/1.73 Final    Comment:    **In accordance with recommendations from the NKF-ASN Task force,**   [facility] is in the process of updating its eGFR calculation to the   2021 CKD-EPI creatinine equation that estimates kidney function   without a race variable.    GFR, Estimated  Date Value Ref Range Status  05/04/2023 >60 >60 mL/min Final    Comment:    (NOTE) Calculated using the CKD-EPI Creatinine Equation (2021)    eGFR  Date Value Ref Range Status  05/03/2023 97 >59 mL/min/1.73 Final         Passed - Completed PHQ-2 or PHQ-9 in the last 360 days      Passed - Valid encounter within last 6 months    Recent Outpatient Visits           1 week ago Dizziness   [place] [facility] [place], Megan P, DO   1 month ago Bedbug bite, initial encounter   [place] Jackolyn Confer, MD   2 months ago Type 2 diabetes mellitus with diabetic neuropathy, without long-term current use of insulin (HCC)   [place] [facility] [place], [place], DO   2 months ago Balance problem   [place] [facility] [place], [place], DO   2 months ago Oral thrush   [place] [facility] [place], Sherran Needs, NP       Future Appointments             In 1  week Laural Benes, Oralia Rud, DO [place] [facility], [facility]   In 1 month [place], Oralia Rud, DO  [facility], [facility]

## 2023-05-10 NOTE — Telephone Encounter (Signed)
Medication Refill -  Most Recent Primary Care Visit:  Provider: Dorcas Carrow  Department: [facility]  Visit Type: OFFICE VISIT  Date: 05/03/2023  Medication: DULoxetine (CYMBALTA) 60 MG capsule [[id number]]   Has the patient contacted their pharmacy? Yes  (Agent: If yes, when and what did the pharmacy advise?) Contact office no refills   Is this the correct pharmacy for this prescription? Yes  This is the patient's preferred pharmacy:  [id number] - [place], [place] - [address] AT [facility] OF [address] [facility] [address] [place] [address] Phone: [phone] Fax: [phone]   Pt is requesting a 90 Day supply   Has the prescription been filled recently? Yes  Is the patient out of the medication? Yes  Has the patient been seen for an appointment in the last year OR does the patient have an upcoming appointment? Yes  Can we respond through MyChart? Yes  Agent: Please be advised that Rx refills may take up to 3 business days. We ask that you follow-up with your pharmacy.

## 2023-05-14 ENCOUNTER — Telehealth: Payer: Self-pay | Admitting: *Deleted

## 2023-05-14 ENCOUNTER — Other Ambulatory Visit: Payer: Self-pay | Admitting: Family Medicine

## 2023-05-14 DIAGNOSIS — M792 Neuralgia and neuritis, unspecified: Secondary | ICD-10-CM

## 2023-05-14 DIAGNOSIS — R42 Dizziness and giddiness: Secondary | ICD-10-CM

## 2023-05-14 DIAGNOSIS — I10 Essential (primary) hypertension: Secondary | ICD-10-CM

## 2023-05-14 DIAGNOSIS — R296 Repeated falls: Secondary | ICD-10-CM

## 2023-05-14 DIAGNOSIS — R159 Full incontinence of feces: Secondary | ICD-10-CM

## 2023-05-14 DIAGNOSIS — R2689 Other abnormalities of gait and mobility: Secondary | ICD-10-CM

## 2023-05-14 DIAGNOSIS — E1140 Type 2 diabetes mellitus with diabetic neuropathy, unspecified: Secondary | ICD-10-CM

## 2023-05-14 NOTE — Progress Notes (Signed)
Complex Care Management Note  Care Guide Note 05/14/2023 Name: Lisa Galvan MRN: [id number] DOB: October 27, 1960  Lisa Galvan is a 63 y.o. year old female who sees Dorcas Carrow, DO for primary care. I reached out to [name] by phone today to offer complex care management services.  [name] was given information about Complex Care Management services today including:   The Complex Care Management services include support from the care team which includes your Nurse Coordinator, Clinical Social Worker, or Pharmacist.  The Complex Care Management team is here to help remove barriers to the health concerns and goals most important to you. Complex Care Management services are voluntary, and the patient may decline or stop services at any time by request to their care team member.   Complex Care Management Consent Status: Patient agreed to services and verbal consent obtained.   Follow up plan:  Telephone appointment with complex care management team member scheduled for:  05/20/2023  Encounter Outcome:  Patient Scheduled  Burman Nieves, CMA, Care Guide [facility]  [facility], [facility] Guide Direct Dial: [phone]  Fax: [phone] Website: [email or web address]

## 2023-05-19 DIAGNOSIS — E1140 Type 2 diabetes mellitus with diabetic neuropathy, unspecified: Secondary | ICD-10-CM | POA: Diagnosis not present

## 2023-05-19 DIAGNOSIS — R000 Tachycardia, unspecified: Secondary | ICD-10-CM | POA: Diagnosis not present

## 2023-05-19 DIAGNOSIS — R0602 Shortness of breath: Secondary | ICD-10-CM | POA: Diagnosis not present

## 2023-05-19 DIAGNOSIS — I10 Essential (primary) hypertension: Secondary | ICD-10-CM | POA: Diagnosis not present

## 2023-05-19 DIAGNOSIS — M62838 Other muscle spasm: Secondary | ICD-10-CM | POA: Diagnosis not present

## 2023-05-20 ENCOUNTER — Encounter: Payer: Self-pay | Admitting: Family Medicine

## 2023-05-20 ENCOUNTER — Ambulatory Visit: Payer: BC Managed Care – PPO | Admitting: Family Medicine

## 2023-05-20 ENCOUNTER — Ambulatory Visit: Payer: Self-pay

## 2023-05-20 VITALS — BP 100/70 | HR 97 | Temp 97.8°F | Ht 62.0 in | Wt 147.0 lb

## 2023-05-20 DIAGNOSIS — R251 Tremor, unspecified: Secondary | ICD-10-CM

## 2023-05-20 DIAGNOSIS — R296 Repeated falls: Secondary | ICD-10-CM

## 2023-05-20 DIAGNOSIS — M62838 Other muscle spasm: Secondary | ICD-10-CM

## 2023-05-20 DIAGNOSIS — R159 Full incontinence of feces: Secondary | ICD-10-CM

## 2023-05-20 LAB — URINALYSIS, ROUTINE W REFLEX MICROSCOPIC
Bilirubin, UA: NEGATIVE
Glucose, UA: NEGATIVE
Ketones, UA: NEGATIVE
Leukocytes,UA: NEGATIVE
Nitrite, UA: NEGATIVE
Protein,UA: NEGATIVE
RBC, UA: NEGATIVE
Specific Gravity, UA: <1.005 — ABNORMAL LOW (ref 1.005–1.030)
Urobilinogen, Ur: 0.2 mg/dL (ref 0.2–1.0)
pH, UA: 6 (ref 5.0–7.5)

## 2023-05-20 NOTE — Patient Instructions (Signed)
Patient has an [facility] appointment on Friday at 11:30 AM. Patient needs to arrive by 11:05 AM.

## 2023-05-20 NOTE — Progress Notes (Signed)
BP 100/70   Pulse 97   Temp 97.8 F (36.6 C) (Oral)   Ht 5\' 2"  (1.575 m)   Wt 147 lb (66.7 kg)   SpO2 (!) 79%   BMI 26.89 kg/m    Subjective:    Patient ID: Lisa Galvan, female    DOB: June 22, 1960, 63 y.o.   MRN: [id number]  HPI: Lisa Galvan is a 63 y.o. female  Chief Complaint  Patient presents with   Dizziness    Patient daughter says the dizziness stopped right away her last office visit. Patient daughter says she was able to walk and move around with her walker. Patient daughter says the patient slipped off the couch last night and had to go to the ER last night. Patient says she has now noticed her shoulders are "locking up and stiffness." Patient says she forgot to take the Gabapentin and says it helps a little bit. Patient daughter says the patient entire body was stiffness yesterday.    Extremity Weakness   Tremors    Patient daughter says she has noticed "shakes" have worsen since the last episode. Patient was informed that her Magnesium was low at the ER visit yesterday. Patient was Valium due to possible muscle spasms, but patient did not pick up prescription until this morning.    ER FOLLOW UP Time since discharge:  Hospital/facility: [facility] Diagnosis: Muscle spasms, hypomagnesemia Procedures/tests: labs, CT head, CXR, EKG Consultants: none New medications: valium Discharge instructions:  follow up here Status: worse  Had toricollis yesterday with a lot of spasms in her neck. She was sitting and then had intense cramps and couldn't move. Daughter took her to the the ER where her CTs were normal. They gave her some valium and sent her home. She has been having difficulty walking. She has been slurring her words. She is tremulous today. Not able to stand. She notes that the shaking started Saturday. She has otherwise been feeling well, but is very anxious about what's going on. She was drinking previously, about a bottle of wine a day, but cut that in half for  about a week, then stopped 2 weeks ago. She has not drank at all in the last 2 weeks. She did not start having these issues until 2 days ago.    Relevant past medical, surgical, family and social history reviewed and updated as indicated. Interim medical history since our last visit reviewed. Allergies and medications reviewed and updated.  Review of Systems  Constitutional:  Positive for activity change and fatigue. Negative for appetite change, chills, diaphoresis, fever and unexpected weight change.  HENT: Negative.    Respiratory: Negative.    Cardiovascular: Negative.   Gastrointestinal: Negative.   Musculoskeletal: Negative.   Skin: Negative.   Neurological:  Positive for dizziness, tremors, speech difficulty, weakness, light-headedness and numbness. Negative for seizures, syncope, facial asymmetry and headaches.  Hematological: Negative.   Psychiatric/Behavioral: Negative.      Per HPI unless specifically indicated above     Objective:    BP 100/70   Pulse 97   Temp 97.8 F (36.6 C) (Oral)   Ht 5\' 2"  (1.575 m)   Wt 147 lb (66.7 kg)   SpO2 (!) 79%   BMI 26.89 kg/m   Wt Readings from Last 3 Encounters:  05/20/23 147 lb (66.7 kg)  05/03/23 147 lb (66.7 kg)  05/03/23 147 lb (66.7 kg)    Physical Exam Vitals and nursing note reviewed.  Constitutional:  General: She is not in acute distress.    Appearance: Normal appearance. She is ill-appearing. She is not toxic-appearing or diaphoretic.     Comments: Wheelchair bound  HENT:     Head: Normocephalic and atraumatic.     Right Ear: External ear normal.     Left Ear: External ear normal.     Nose: Nose normal.     Mouth/Throat:     Mouth: Mucous membranes are moist.     Pharynx: Oropharynx is clear.  Eyes:     General: No scleral icterus.       Right eye: No discharge.        Left eye: No discharge.     Extraocular Movements: Extraocular movements intact.     Conjunctiva/sclera: Conjunctivae normal.      Pupils: Pupils are equal, round, and reactive to light.  Cardiovascular:     Rate and Rhythm: Normal rate and regular rhythm.     Pulses: Normal pulses.     Heart sounds: Normal heart sounds. No murmur heard.    No friction rub. No gallop.  Pulmonary:     Effort: Pulmonary effort is normal. No respiratory distress.     Breath sounds: Normal breath sounds. No stridor. No wheezing, rhonchi or rales.  Chest:     Chest wall: No tenderness.  Musculoskeletal:        General: Normal range of motion.     Cervical back: Normal range of motion and neck supple.  Skin:    General: Skin is warm and dry.     Capillary Refill: Capillary refill takes less than 2 seconds.     Coloration: Skin is not jaundiced or pale.     Findings: No bruising, erythema, lesion or rash.  Neurological:     General: No focal deficit present.     Mental Status: She is alert and oriented to person, place, and time. Mental status is at baseline.     Comments: tremulous  Psychiatric:        Mood and Affect: Mood normal.        Behavior: Behavior normal.        Thought Content: Thought content normal.        Judgment: Judgment normal.     Results for orders placed or performed in visit on 05/20/23  Urinalysis, Routine w reflex microscopic   Collection Time: 05/20/23 11:37 AM  Result Value Ref Range   Specific Gravity, UA <1.005 (L) 1.005 - 1.030   pH, UA 6.0 5.0 - 7.5   Color, UA Yellow Yellow   Appearance Ur Clear Clear   Leukocytes,UA Negative Negative   Protein,UA Negative Negative/Trace   Glucose, UA Negative Negative   Ketones, UA Negative Negative   RBC, UA Negative Negative   Bilirubin, UA Negative Negative   Urobilinogen, Ur 0.2 0.2 - 1.0 mg/dL   Nitrite, UA Negative Negative   Microscopic Examination Comment       Assessment & Plan:   Problem List Items Addressed This Visit       Other   Recurrent falls - Primary   Has not fallen since last visit, but issues are getting worse. Unable to get  home health due to insurance. Social work is to call to see about helping her apply for medicare or medicaid early. Continue to monitor. To see neurology again on Friday.       Other Visit Diagnoses       Tremor  New onset. Has not drank any alcohol in 3 weeks and weaned down prior to that. Unlikely alcohol withdrawl. Labs normal. Will check for UTI- neuro Friday.   Relevant Orders   Urinalysis, Routine w reflex microscopic (Completed)     Incontinence of feces, unspecified fecal incontinence type       Resolved since stopping ozempic. Remain off ozempic. Continue to monitor.     Muscle spasms of neck       Continue PRN valium. To see neurology on Friday.        Follow up plan: Return for As scheduled.  >40 minutes spent with patient and daughter today

## 2023-05-20 NOTE — Patient Instructions (Signed)
Visit Information  Thank you for taking time to visit with me today. Please don't hesitate to contact me if I can be of assistance to you.   Following are the goals we discussed today:  Patient/daughter will apply online for Medicaid. Patient/daughter will apply for disability with Optometrist.   Our next appointment is by telephone on 05/31/23 at 10am  Please call the care guide team at [phone] if you need to cancel or reschedule your appointment.   If you are experiencing a Mental Health or Behavioral Health Crisis or need someone to talk to, please call 911  Patient verbalizes understanding of instructions and care plan provided today and agrees to view in MyChart. Active MyChart status and patient understanding of how to access instructions and care plan via MyChart confirmed with patient.     Telephone follow up appointment with care management team member scheduled for:05/31/23 at 10am  Lysle Morales, BSW [place]  [facility], [facility] Social Worker Direct Dial: [phone]  Fax: [phone] Website: [email or web address]

## 2023-05-20 NOTE — Patient Outreach (Signed)
  Care Coordination   Initial Visit Note   05/20/2023 Name: Lisa Galvan MRN: [id number] DOB: 1960/05/23  Lisa Galvan is a 63 y.o. year old female who sees Dorcas Carrow, DO for primary care. I spoke with  [name] and daughter Lisa Galvan by phone today.  What matters to the patients health and wellness today?  Patient wants information on personal care, Medicaid, Medicare and Disability.  Patient currently receives [employer] and [employer], but it does not cover [employer]. Patient/daughter will apply for Medicaid and contact [employer] to apply for disability.    Goals Addressed             This Visit's Progress    Care Coordination Activities       Interventions Today    Flowsheet Row Most Recent Value  Chronic Disease   Chronic disease during today's visit Diabetes, Hypertension (HTN)  General Interventions   General Interventions Discussed/Reviewed General Interventions Discussed, General Interventions Reviewed, [employer], Communication with  [Pt needs insurance.Income from deceased spouse.Daughter joins call and verbal consent provided.[employer] food bank helps.Pt will need to call [employer] to start disability application.]  Communication with --  [SW t/c Medicare to confirm that patient will start benefits Dec 2025.]  Education Interventions   Education Provided --  [Sw explains Medicaid. In Home Aide services is not provided by Medicare.]              SDOH assessments and interventions completed:  Yes  SDOH Interventions Today    Flowsheet Row Most Recent Value  SDOH Interventions   Food Insecurity Interventions Intervention Not Indicated, Other (Comment)  [[employer]]  Housing Interventions Intervention Not Indicated  Transportation Interventions Intervention Not Indicated, Other (Comment)  [Family provides transportation]  Utilities Interventions Intervention Not Indicated        Care Coordination Interventions:  Yes,  provided   Follow up plan: Follow up call scheduled for 05/31/23 at 10am    Encounter Outcome:  Patient Visit Completed

## 2023-05-21 DIAGNOSIS — R296 Repeated falls: Secondary | ICD-10-CM | POA: Insufficient documentation

## 2023-05-22 DIAGNOSIS — R251 Tremor, unspecified: Secondary | ICD-10-CM | POA: Diagnosis not present

## 2023-05-22 DIAGNOSIS — N390 Urinary tract infection, site not specified: Secondary | ICD-10-CM | POA: Diagnosis not present

## 2023-05-22 DIAGNOSIS — E2740 Unspecified adrenocortical insufficiency: Secondary | ICD-10-CM | POA: Diagnosis not present

## 2023-05-22 DIAGNOSIS — E876 Hypokalemia: Secondary | ICD-10-CM | POA: Diagnosis not present

## 2023-05-22 DIAGNOSIS — F1026 Alcohol dependence with alcohol-induced persisting amnestic disorder: Secondary | ICD-10-CM | POA: Diagnosis not present

## 2023-05-22 DIAGNOSIS — G4733 Obstructive sleep apnea (adult) (pediatric): Secondary | ICD-10-CM | POA: Diagnosis not present

## 2023-05-22 DIAGNOSIS — R443 Hallucinations, unspecified: Secondary | ICD-10-CM | POA: Diagnosis not present

## 2023-05-22 DIAGNOSIS — R410 Disorientation, unspecified: Secondary | ICD-10-CM | POA: Diagnosis not present

## 2023-05-22 DIAGNOSIS — F1090 Alcohol use, unspecified, uncomplicated: Secondary | ICD-10-CM | POA: Diagnosis not present

## 2023-05-22 DIAGNOSIS — Z1619 Resistance to other specified beta lactam antibiotics: Secondary | ICD-10-CM | POA: Diagnosis not present

## 2023-05-22 DIAGNOSIS — E861 Hypovolemia: Secondary | ICD-10-CM | POA: Diagnosis not present

## 2023-05-22 DIAGNOSIS — G928 Other toxic encephalopathy: Secondary | ICD-10-CM | POA: Diagnosis not present

## 2023-05-22 DIAGNOSIS — R278 Other lack of coordination: Secondary | ICD-10-CM | POA: Diagnosis not present

## 2023-05-22 DIAGNOSIS — R0902 Hypoxemia: Secondary | ICD-10-CM | POA: Diagnosis not present

## 2023-05-22 DIAGNOSIS — E785 Hyperlipidemia, unspecified: Secondary | ICD-10-CM | POA: Diagnosis not present

## 2023-05-22 DIAGNOSIS — I071 Rheumatic tricuspid insufficiency: Secondary | ICD-10-CM | POA: Diagnosis not present

## 2023-05-22 DIAGNOSIS — E440 Moderate protein-calorie malnutrition: Secondary | ICD-10-CM | POA: Diagnosis not present

## 2023-05-22 DIAGNOSIS — R8281 Pyuria: Secondary | ICD-10-CM | POA: Diagnosis not present

## 2023-05-22 DIAGNOSIS — Z1624 Resistance to multiple antibiotics: Secondary | ICD-10-CM | POA: Diagnosis not present

## 2023-05-22 DIAGNOSIS — F10231 Alcohol dependence with withdrawal delirium: Secondary | ICD-10-CM | POA: Diagnosis not present

## 2023-05-22 DIAGNOSIS — K5900 Constipation, unspecified: Secondary | ICD-10-CM | POA: Diagnosis not present

## 2023-05-22 DIAGNOSIS — F39 Unspecified mood [affective] disorder: Secondary | ICD-10-CM | POA: Diagnosis not present

## 2023-05-22 DIAGNOSIS — G253 Myoclonus: Secondary | ICD-10-CM | POA: Diagnosis not present

## 2023-05-22 DIAGNOSIS — E8809 Other disorders of plasma-protein metabolism, not elsewhere classified: Secondary | ICD-10-CM | POA: Diagnosis not present

## 2023-05-22 DIAGNOSIS — E569 Vitamin deficiency, unspecified: Secondary | ICD-10-CM | POA: Diagnosis not present

## 2023-05-22 DIAGNOSIS — E46 Unspecified protein-calorie malnutrition: Secondary | ICD-10-CM | POA: Diagnosis not present

## 2023-05-22 DIAGNOSIS — F0150 Vascular dementia without behavioral disturbance: Secondary | ICD-10-CM | POA: Diagnosis not present

## 2023-05-22 DIAGNOSIS — R4182 Altered mental status, unspecified: Secondary | ICD-10-CM | POA: Diagnosis not present

## 2023-05-22 DIAGNOSIS — B964 Proteus (mirabilis) (morganii) as the cause of diseases classified elsewhere: Secondary | ICD-10-CM | POA: Diagnosis not present

## 2023-05-22 DIAGNOSIS — F01511 Vascular dementia, unspecified severity, with agitation: Secondary | ICD-10-CM | POA: Diagnosis not present

## 2023-05-22 DIAGNOSIS — R4189 Other symptoms and signs involving cognitive functions and awareness: Secondary | ICD-10-CM | POA: Diagnosis not present

## 2023-05-22 DIAGNOSIS — I959 Hypotension, unspecified: Secondary | ICD-10-CM | POA: Diagnosis not present

## 2023-05-22 DIAGNOSIS — E1142 Type 2 diabetes mellitus with diabetic polyneuropathy: Secondary | ICD-10-CM | POA: Diagnosis not present

## 2023-05-22 DIAGNOSIS — R451 Restlessness and agitation: Secondary | ICD-10-CM | POA: Diagnosis not present

## 2023-05-22 DIAGNOSIS — D72819 Decreased white blood cell count, unspecified: Secondary | ICD-10-CM | POA: Diagnosis not present

## 2023-05-22 DIAGNOSIS — R000 Tachycardia, unspecified: Secondary | ICD-10-CM | POA: Diagnosis not present

## 2023-05-22 DIAGNOSIS — I824Y2 Acute embolism and thrombosis of unspecified deep veins of left proximal lower extremity: Secondary | ICD-10-CM | POA: Diagnosis not present

## 2023-05-22 DIAGNOSIS — J309 Allergic rhinitis, unspecified: Secondary | ICD-10-CM | POA: Diagnosis not present

## 2023-05-22 DIAGNOSIS — G9340 Encephalopathy, unspecified: Secondary | ICD-10-CM | POA: Diagnosis not present

## 2023-05-22 DIAGNOSIS — R41841 Cognitive communication deficit: Secondary | ICD-10-CM | POA: Diagnosis not present

## 2023-05-22 DIAGNOSIS — R441 Visual hallucinations: Secondary | ICD-10-CM | POA: Diagnosis not present

## 2023-05-22 DIAGNOSIS — I824Z2 Acute embolism and thrombosis of unspecified deep veins of left distal lower extremity: Secondary | ICD-10-CM | POA: Diagnosis not present

## 2023-05-22 DIAGNOSIS — F05 Delirium due to known physiological condition: Secondary | ICD-10-CM | POA: Diagnosis not present

## 2023-05-22 DIAGNOSIS — R6521 Severe sepsis with septic shock: Secondary | ICD-10-CM | POA: Diagnosis not present

## 2023-05-22 DIAGNOSIS — R7989 Other specified abnormal findings of blood chemistry: Secondary | ICD-10-CM | POA: Diagnosis not present

## 2023-05-22 DIAGNOSIS — K219 Gastro-esophageal reflux disease without esophagitis: Secondary | ICD-10-CM | POA: Diagnosis not present

## 2023-05-22 DIAGNOSIS — R440 Auditory hallucinations: Secondary | ICD-10-CM | POA: Diagnosis not present

## 2023-05-22 DIAGNOSIS — R269 Unspecified abnormalities of gait and mobility: Secondary | ICD-10-CM | POA: Diagnosis not present

## 2023-05-22 DIAGNOSIS — I509 Heart failure, unspecified: Secondary | ICD-10-CM | POA: Diagnosis not present

## 2023-05-22 DIAGNOSIS — G929 Unspecified toxic encephalopathy: Secondary | ICD-10-CM | POA: Diagnosis not present

## 2023-05-22 DIAGNOSIS — A419 Sepsis, unspecified organism: Secondary | ICD-10-CM | POA: Diagnosis not present

## 2023-05-22 DIAGNOSIS — J80 Acute respiratory distress syndrome: Secondary | ICD-10-CM | POA: Diagnosis not present

## 2023-05-22 DIAGNOSIS — I10 Essential (primary) hypertension: Secondary | ICD-10-CM | POA: Diagnosis not present

## 2023-05-22 DIAGNOSIS — Z1152 Encounter for screening for COVID-19: Secondary | ICD-10-CM | POA: Diagnosis not present

## 2023-05-22 DIAGNOSIS — E1140 Type 2 diabetes mellitus with diabetic neuropathy, unspecified: Secondary | ICD-10-CM | POA: Diagnosis not present

## 2023-05-22 DIAGNOSIS — G8929 Other chronic pain: Secondary | ICD-10-CM | POA: Diagnosis not present

## 2023-05-22 DIAGNOSIS — G4730 Sleep apnea, unspecified: Secondary | ICD-10-CM | POA: Diagnosis not present

## 2023-05-22 DIAGNOSIS — F01518 Vascular dementia, unspecified severity, with other behavioral disturbance: Secondary | ICD-10-CM | POA: Diagnosis not present

## 2023-05-22 DIAGNOSIS — G250 Essential tremor: Secondary | ICD-10-CM | POA: Diagnosis not present

## 2023-05-22 DIAGNOSIS — R680 Hypothermia, not associated with low environmental temperature: Secondary | ICD-10-CM | POA: Diagnosis not present

## 2023-05-22 DIAGNOSIS — R569 Unspecified convulsions: Secondary | ICD-10-CM | POA: Diagnosis not present

## 2023-05-22 DIAGNOSIS — K760 Fatty (change of) liver, not elsewhere classified: Secondary | ICD-10-CM | POA: Diagnosis not present

## 2023-05-22 DIAGNOSIS — M6281 Muscle weakness (generalized): Secondary | ICD-10-CM | POA: Diagnosis not present

## 2023-05-26 ENCOUNTER — Encounter: Payer: Self-pay | Admitting: Family Medicine

## 2023-05-26 NOTE — Assessment & Plan Note (Addendum)
Has not fallen since last visit, but issues are getting worse. Unable to get home health due to insurance. Social work is to call to see about helping her apply for medicare or medicaid early. Continue to monitor. To see neurology again on Friday. Warning signs to go to the ER discussed again today.

## 2023-05-31 ENCOUNTER — Ambulatory Visit: Payer: Self-pay

## 2023-05-31 NOTE — Patient Outreach (Signed)
  Care Coordination   05/31/2023 Name: Michaelyn Wall MRN: [id number] DOB: 1960/05/18   Care Coordination Outreach Attempts:  An unsuccessful outreach was attempted for an appointment today.  Follow Up Plan:  Additional outreach attempts will be made to offer the patient complex care management information and services.   Encounter Outcome:  No Answer   Care Coordination Interventions:  No, not indicated    Tillman Gardener, BSW [place]  [facility], [facility] Social Worker Direct Dial: [phone]  Fax: [phone] Website: [email or web address]

## 2023-06-10 ENCOUNTER — Telehealth: Payer: Self-pay

## 2023-06-10 NOTE — Telephone Encounter (Signed)
Ok for [facility] to review.  Called patient to offer reschedule due to possible inclement weather for her scheduled appointment for 06/12/2023 however unable to reach her. Please offer her next available. If she is however available tomorrow please send me a secure chat message with her name and I will see if we can still fit her in tomorrow but please do not confirm that as I will need to do that.

## 2023-06-10 NOTE — Telephone Encounter (Signed)
Pt is calling back. Agent reviewed message with the patient. Patient advised that she is in the hospital at [facility]. She will call to schedule hospital follow up after she is discharged.

## 2023-06-12 ENCOUNTER — Encounter: Payer: Self-pay | Admitting: Family Medicine

## 2023-06-17 ENCOUNTER — Ambulatory Visit: Payer: Self-pay | Admitting: *Deleted

## 2023-06-17 NOTE — Patient Outreach (Signed)
 Care Coordination   Follow Up Visit Note   06/17/2023 Name: Lisa Galvan MRN: [id number] DOB: November 02, 1960  Lisa Galvan is a 63 y.o. year old female who sees Dorcas Carrow, DO for primary care.   Chart reviewed while preparing for scheduled outreach, noted that patient is currently at [facility], admitted since 1/29.  Per note, waiting on placement.       SDOH assessments and interventions completed:  No     Care Coordination Interventions:  No, not indicated   Follow up plan:  pending hospital/SNF discharge    Encounter Outcome:  Patient Visit Completed   Rodney Langton, RN, MSN, CCM [place]  [facility], [facility] RN Care Coordinator Direct Dial: [phone] / Main [phone] Fax [phone] Email: [email or web address] Website: [email or web address]

## 2023-06-21 DIAGNOSIS — G250 Essential tremor: Secondary | ICD-10-CM | POA: Diagnosis not present

## 2023-06-21 DIAGNOSIS — K760 Fatty (change of) liver, not elsewhere classified: Secondary | ICD-10-CM | POA: Diagnosis not present

## 2023-06-21 DIAGNOSIS — R600 Localized edema: Secondary | ICD-10-CM | POA: Diagnosis not present

## 2023-06-21 DIAGNOSIS — E440 Moderate protein-calorie malnutrition: Secondary | ICD-10-CM | POA: Diagnosis not present

## 2023-06-21 DIAGNOSIS — D649 Anemia, unspecified: Secondary | ICD-10-CM | POA: Diagnosis not present

## 2023-06-21 DIAGNOSIS — M6281 Muscle weakness (generalized): Secondary | ICD-10-CM | POA: Diagnosis not present

## 2023-06-21 DIAGNOSIS — K5900 Constipation, unspecified: Secondary | ICD-10-CM | POA: Diagnosis not present

## 2023-06-21 DIAGNOSIS — E1140 Type 2 diabetes mellitus with diabetic neuropathy, unspecified: Secondary | ICD-10-CM | POA: Diagnosis not present

## 2023-06-21 DIAGNOSIS — R4182 Altered mental status, unspecified: Secondary | ICD-10-CM | POA: Diagnosis not present

## 2023-06-21 DIAGNOSIS — K219 Gastro-esophageal reflux disease without esophagitis: Secondary | ICD-10-CM | POA: Diagnosis not present

## 2023-06-21 DIAGNOSIS — I82402 Acute embolism and thrombosis of unspecified deep veins of left lower extremity: Secondary | ICD-10-CM | POA: Diagnosis not present

## 2023-06-21 DIAGNOSIS — T7840XA Allergy, unspecified, initial encounter: Secondary | ICD-10-CM | POA: Diagnosis not present

## 2023-06-21 DIAGNOSIS — R278 Other lack of coordination: Secondary | ICD-10-CM | POA: Diagnosis not present

## 2023-06-21 DIAGNOSIS — R52 Pain, unspecified: Secondary | ICD-10-CM | POA: Diagnosis not present

## 2023-06-21 DIAGNOSIS — R41841 Cognitive communication deficit: Secondary | ICD-10-CM | POA: Diagnosis not present

## 2023-06-21 DIAGNOSIS — G928 Other toxic encephalopathy: Secondary | ICD-10-CM | POA: Diagnosis not present

## 2023-06-21 DIAGNOSIS — G4733 Obstructive sleep apnea (adult) (pediatric): Secondary | ICD-10-CM | POA: Diagnosis not present

## 2023-06-21 DIAGNOSIS — F39 Unspecified mood [affective] disorder: Secondary | ICD-10-CM | POA: Diagnosis not present

## 2023-06-21 DIAGNOSIS — R441 Visual hallucinations: Secondary | ICD-10-CM | POA: Diagnosis not present

## 2023-06-21 DIAGNOSIS — F1026 Alcohol dependence with alcohol-induced persisting amnestic disorder: Secondary | ICD-10-CM | POA: Diagnosis not present

## 2023-06-21 DIAGNOSIS — F10231 Alcohol dependence with withdrawal delirium: Secondary | ICD-10-CM | POA: Diagnosis not present

## 2023-06-21 DIAGNOSIS — E785 Hyperlipidemia, unspecified: Secondary | ICD-10-CM | POA: Diagnosis not present

## 2023-06-21 DIAGNOSIS — E569 Vitamin deficiency, unspecified: Secondary | ICD-10-CM | POA: Diagnosis not present

## 2023-06-21 DIAGNOSIS — R440 Auditory hallucinations: Secondary | ICD-10-CM | POA: Diagnosis not present

## 2023-06-21 DIAGNOSIS — I959 Hypotension, unspecified: Secondary | ICD-10-CM | POA: Diagnosis not present

## 2023-06-21 DIAGNOSIS — J309 Allergic rhinitis, unspecified: Secondary | ICD-10-CM | POA: Diagnosis not present

## 2023-06-21 DIAGNOSIS — G9341 Metabolic encephalopathy: Secondary | ICD-10-CM | POA: Diagnosis not present

## 2023-06-24 DIAGNOSIS — F10231 Alcohol dependence with withdrawal delirium: Secondary | ICD-10-CM | POA: Diagnosis not present

## 2023-06-24 DIAGNOSIS — I82402 Acute embolism and thrombosis of unspecified deep veins of left lower extremity: Secondary | ICD-10-CM | POA: Diagnosis not present

## 2023-06-24 DIAGNOSIS — M6281 Muscle weakness (generalized): Secondary | ICD-10-CM | POA: Diagnosis not present

## 2023-06-24 DIAGNOSIS — E1140 Type 2 diabetes mellitus with diabetic neuropathy, unspecified: Secondary | ICD-10-CM | POA: Diagnosis not present

## 2023-06-27 DIAGNOSIS — G9341 Metabolic encephalopathy: Secondary | ICD-10-CM | POA: Diagnosis not present

## 2023-06-27 DIAGNOSIS — E1140 Type 2 diabetes mellitus with diabetic neuropathy, unspecified: Secondary | ICD-10-CM | POA: Diagnosis not present

## 2023-06-27 DIAGNOSIS — M6281 Muscle weakness (generalized): Secondary | ICD-10-CM | POA: Diagnosis not present

## 2023-06-27 DIAGNOSIS — F10231 Alcohol dependence with withdrawal delirium: Secondary | ICD-10-CM | POA: Diagnosis not present

## 2023-07-01 DIAGNOSIS — E1140 Type 2 diabetes mellitus with diabetic neuropathy, unspecified: Secondary | ICD-10-CM | POA: Diagnosis not present

## 2023-07-01 DIAGNOSIS — D649 Anemia, unspecified: Secondary | ICD-10-CM | POA: Diagnosis not present

## 2023-07-01 DIAGNOSIS — R600 Localized edema: Secondary | ICD-10-CM | POA: Diagnosis not present

## 2023-07-02 ENCOUNTER — Telehealth: Payer: Self-pay

## 2023-07-02 NOTE — Telephone Encounter (Signed)
 Routing to provider. Can we enter a new PT referral for the patient? Or does she need an appointment to discuss first?

## 2023-07-02 NOTE — Telephone Encounter (Signed)
 Copied from CRM [id number]. Topic: Appointments - Appointment Cancel/Reschedule >> Jun 28, 2023  9:41 AM Truddie Crumble wrote: Patient/patient representative is calling to cancel or reschedule an appointment. Refer to attachments for appointment information. Patient called stating she wants to leave the physical therapist place she is at which is [facility] and  transfer to another place or have an in home therapist come to her house

## 2023-07-02 NOTE — Telephone Encounter (Signed)
 She is currently in a SNF- if she's being discharged from SNF she will need a hospital follow up, but she should only leave SNF if they say she's safe to be discharged

## 2023-07-03 DIAGNOSIS — I82402 Acute embolism and thrombosis of unspecified deep veins of left lower extremity: Secondary | ICD-10-CM | POA: Diagnosis not present

## 2023-07-03 DIAGNOSIS — R600 Localized edema: Secondary | ICD-10-CM | POA: Diagnosis not present

## 2023-07-03 NOTE — Telephone Encounter (Signed)
 Called and notified patient of Dr. Henriette Combs message. Patient states she is currently still at the SNF.

## 2023-07-05 DIAGNOSIS — T7840XA Allergy, unspecified, initial encounter: Secondary | ICD-10-CM | POA: Diagnosis not present

## 2023-07-12 DIAGNOSIS — K219 Gastro-esophageal reflux disease without esophagitis: Secondary | ICD-10-CM | POA: Diagnosis not present

## 2023-07-12 DIAGNOSIS — E1140 Type 2 diabetes mellitus with diabetic neuropathy, unspecified: Secondary | ICD-10-CM | POA: Diagnosis not present

## 2023-07-12 DIAGNOSIS — J309 Allergic rhinitis, unspecified: Secondary | ICD-10-CM | POA: Diagnosis not present

## 2023-07-12 DIAGNOSIS — I82402 Acute embolism and thrombosis of unspecified deep veins of left lower extremity: Secondary | ICD-10-CM | POA: Diagnosis not present

## 2023-07-15 ENCOUNTER — Telehealth: Payer: Self-pay

## 2023-07-15 ENCOUNTER — Ambulatory Visit (INDEPENDENT_AMBULATORY_CARE_PROVIDER_SITE_OTHER): Admitting: Family Medicine

## 2023-07-15 VITALS — BP 134/84 | HR 73 | Temp 98.0°F | Wt 145.0 lb

## 2023-07-15 DIAGNOSIS — R4182 Altered mental status, unspecified: Secondary | ICD-10-CM | POA: Diagnosis not present

## 2023-07-15 DIAGNOSIS — E538 Deficiency of other specified B group vitamins: Secondary | ICD-10-CM

## 2023-07-15 DIAGNOSIS — E162 Hypoglycemia, unspecified: Secondary | ICD-10-CM

## 2023-07-15 DIAGNOSIS — E60 Dietary zinc deficiency: Secondary | ICD-10-CM

## 2023-07-15 DIAGNOSIS — E440 Moderate protein-calorie malnutrition: Secondary | ICD-10-CM

## 2023-07-15 DIAGNOSIS — E1140 Type 2 diabetes mellitus with diabetic neuropathy, unspecified: Secondary | ICD-10-CM

## 2023-07-15 DIAGNOSIS — E559 Vitamin D deficiency, unspecified: Secondary | ICD-10-CM

## 2023-07-15 DIAGNOSIS — R7989 Other specified abnormal findings of blood chemistry: Secondary | ICD-10-CM

## 2023-07-15 DIAGNOSIS — E785 Hyperlipidemia, unspecified: Secondary | ICD-10-CM

## 2023-07-15 DIAGNOSIS — E519 Thiamine deficiency, unspecified: Secondary | ICD-10-CM

## 2023-07-15 DIAGNOSIS — Z87440 Personal history of urinary (tract) infections: Secondary | ICD-10-CM | POA: Diagnosis not present

## 2023-07-15 DIAGNOSIS — E1142 Type 2 diabetes mellitus with diabetic polyneuropathy: Secondary | ICD-10-CM | POA: Diagnosis not present

## 2023-07-15 DIAGNOSIS — E610 Copper deficiency: Secondary | ICD-10-CM

## 2023-07-15 DIAGNOSIS — I82492 Acute embolism and thrombosis of other specified deep vein of left lower extremity: Secondary | ICD-10-CM | POA: Diagnosis not present

## 2023-07-15 DIAGNOSIS — M792 Neuralgia and neuritis, unspecified: Secondary | ICD-10-CM

## 2023-07-15 DIAGNOSIS — E861 Hypovolemia: Secondary | ICD-10-CM

## 2023-07-15 DIAGNOSIS — E876 Hypokalemia: Secondary | ICD-10-CM

## 2023-07-15 LAB — URINALYSIS, ROUTINE W REFLEX MICROSCOPIC
Bilirubin, UA: NEGATIVE
Glucose, UA: NEGATIVE
Nitrite, UA: NEGATIVE
Specific Gravity, UA: 1.015 (ref 1.005–1.030)
Urobilinogen, Ur: 1 mg/dL (ref 0.2–1.0)
pH, UA: 6 (ref 5.0–7.5)

## 2023-07-15 LAB — MICROSCOPIC EXAMINATION: WBC, UA: >30 /HPF — AB (ref 0–5)

## 2023-07-15 LAB — BAYER DCA HB A1C WAIVED: HB A1C (BAYER DCA - WAIVED): 5.1 % (ref 4.8–5.6)

## 2023-07-15 NOTE — Transitions of Care (Post Inpatient/ED Visit) (Signed)
 07/15/2023  Name: Lisa Galvan MRN: [id number] DOB: 06/10/1960  Today's TOC FU Call Status: Today's TOC FU Call Status:: Successful TOC FU Call Completed TOC FU Call Complete Date: 07/15/23 Patient's Name and Date of Birth confirmed.  Transition Care Management Follow-up Telephone Call Date of Discharge: 07/12/23 Discharge Facility: Other ([facility]) Name of Other ([facility]) [facility]charge Facility: [facility] Type of Discharge: Inpatient Admission Primary Inpatient Discharge Diagnosis:: altered mental How have you been since you were released from the hospital?: Better Any questions or concerns?: No  Items Reviewed: Did you receive and understand the discharge instructions provided?: Yes Medications obtained,verified, and reconciled?: Yes (Medications Reviewed) Any new allergies since your discharge?: No Dietary orders reviewed?: Yes Do you have support at home?: Yes People in Home: child(ren), adult  Medications Reviewed Today: Medications Reviewed Today     Reviewed by Karena Addison, LPN (Licensed Practical Nurse) on 07/15/23 at 1159  Med List Status: <None>   Medication Order Taking? Sig Documenting Provider Last Dose Status Informant  Accu-Chek Softclix Lancets lancets [id number] No TEST TWICE DAILY Laural Benes, Megan P, DO Taking Active Self  acetaminophen (TYLENOL) 650 MG CR tablet [id number] No Take 650 mg by mouth daily as needed for pain. [provider] Taking Active Self  aspirin EC 81 MG tablet [id number] No Take 81 mg by mouth daily. Swallow whole. [provider] Taking Active Self  atorvastatin (LIPITOR) 20 MG tablet [id number] No Take 1 tablet (20 mg total) by mouth daily. TAKE 1 TABLET(20 MG) BY MOUTH DAILY Laural Benes, Megan P, DO Taking Active Self  Cholecalciferol (VITAMIN D-3) 5000 units TABS [id number] No Take 5,000 Units by mouth daily. [provider] Taking Active Self  clotrimazole-betamethasone (LOTRISONE) cream [id number] No Apply 1  Application topically daily. Olevia Perches P, DO Taking Active Self  diclofenac Sodium (VOLTAREN) 1 % GEL [id number] No Apply 4 g topically 4 (four) times daily. Olevia Perches P, DO Taking Active Self  DULoxetine (CYMBALTA) 60 MG capsule [id number] No TAKE 1 CAPSULE BY MOUTH EVERY DAY Johnson, Megan P, DO Taking Active   EPINEPHrine 0.3 mg/0.3 mL IJ SOAJ injection [id number] No Inject 0.3 mg into the muscle as needed for anaphylaxis. Olevia Perches P, DO Taking Active Self  fexofenadine (ALLERGY RELIEF) 180 MG tablet [id number] No Take 1 tablet (180 mg total) by mouth daily. Home med. Poggi, Excell Seltzer, MD Taking Active Self  gabapentin (NEURONTIN) 300 MG capsule [id number] No Takes 1 cap. In am; 1 in afternoon and 2 cap. At bedtime Dorcas Carrow, DO Taking Active Self  Magnesium Oxide -Mg Supplement 500 MG TABS [id number] No Take 500 mg by mouth daily. [provider] Taking Active Self  metFORMIN (GLUCOPHAGE) 500 MG tablet [id number] No Take 1 tablet (500 mg total) by mouth daily with breakfast. Olevia Perches P, DO Taking Active Self  metoprolol succinate (TOPROL-XL) 25 MG 24 hr tablet [id number] No Take 1 tablet (25 mg total) by mouth daily. Olevia Perches P, DO Taking Active Self  omeprazole (PRILOSEC) 40 MG capsule [id number] No TAKE 1 CAPSULE(40 MG) BY MOUTH BID Laural Benes, Megan P, DO Taking Active Self  vitamin B-12 (CYANOCOBALAMIN) 1000 MCG tablet [id number] No Take 1,000 mcg by mouth 2 (two) times daily. [provider] Taking Active Self  Med List Note Ed Blalock, CPhT 01/31/21 1151):               Home Care and Equipment/Supplies: Were Home Health Services Ordered?: NA Any new equipment or medical supplies ordered?: NA  Functional  Questionnaire: Do you need assistance with bathing/showering or dressing?: No Do you need assistance with meal preparation?: No Do you need assistance with eating?: No Do you have difficulty maintaining continence: No Do you need  assistance with getting out of bed/getting out of a chair/moving?: No Do you have difficulty managing or taking your medications?: No  Follow up appointments reviewed: PCP Follow-up appointment confirmed?: Yes Date of PCP follow-up appointment?: 07/15/23 Follow-up Provider: [facility] Follow-up appointment confirmed?: NA Do you need transportation to your follow-up appointment?: No Do you understand care options if your condition(s) worsen?: Yes-patient verbalized understanding    SIGNATURE Karena Addison, LPN [facility] Nurse Health Advisor Direct Dial [phone]

## 2023-07-15 NOTE — Progress Notes (Unsigned)
 BP 134/84   Pulse 73   Temp 98 F (36.7 C) (Oral)   Wt 145 lb (65.8 kg)   SpO2 98%   BMI 26.52 kg/m    Subjective:    Patient ID: Lisa Galvan, female    DOB: May 29, 1960, 63 y.o.   MRN: [id number]  HPI: Lisa Galvan is a 63 y.o. female  No chief complaint on file.  Transition of Care Hospital Follow up.   Hospital/Facility: [facility] followed by SNF ([facility]) D/C Physician: Dr. Marvis Moeller D/C Date: 06/21/23 from hospital and 07/12/23 from SNF ([facility])  Records Requested: 07/15/23 Records Received: 07/15/23 Records Reviewed: 07/15/23  Diagnoses on Discharge: Altered mental status (POA: Yes) Bilateral leg edema (POA: Yes) Gastroesophageal reflux disease without esophagitis (POA: Yes) Hyperlipidemia (POA: Yes) Neurogenic pain (POA: Yes) OSA (obstructive sleep apnea) (POA: Yes) Type 2 diabetes mellitus with diabetic neuropathy, unspecified (CMS-HCC) (POA: Yes) Vitamin D deficiency (POA: Yes) Weakness of both lower extremities (POA: Yes) Tremor (POA: Yes) Alcohol use disorder (POA: Yes)   Date of interactive Contact within 48 hours of discharge: 07/15/23 Contact was through: phone  Date of 7 day or 14 day face-to-face visit:  07/15/23  within 7 days  Outpatient Encounter Medications as of 07/15/2023  Medication Sig  . Accu-Chek Softclix Lancets lancets TEST TWICE DAILY  . acetaminophen (TYLENOL) 650 MG CR tablet Take 650 mg by mouth daily as needed for pain.  Marland Kitchen aspirin EC 81 MG tablet Take 81 mg by mouth daily. Swallow whole.  Marland Kitchen atorvastatin (LIPITOR) 20 MG tablet Take 1 tablet (20 mg total) by mouth daily. TAKE 1 TABLET(20 MG) BY MOUTH DAILY  . Cholecalciferol (VITAMIN D-3) 5000 units TABS Take 5,000 Units by mouth daily.  . clotrimazole-betamethasone (LOTRISONE) cream Apply 1 Application topically daily.  . diclofenac Sodium (VOLTAREN) 1 % GEL Apply 4 g topically 4 (four) times daily.  . DULoxetine (CYMBALTA) 60 MG capsule TAKE 1 CAPSULE BY MOUTH EVERY DAY  . EPINEPHrine 0.3  mg/0.3 mL IJ SOAJ injection Inject 0.3 mg into the muscle as needed for anaphylaxis.  . fexofenadine (ALLERGY RELIEF) 180 MG tablet Take 1 tablet (180 mg total) by mouth daily. Home med.  . gabapentin (NEURONTIN) 300 MG capsule Takes 1 cap. In am; 1 in afternoon and 2 cap. At bedtime  . Magnesium Oxide -Mg Supplement 500 MG TABS Take 500 mg by mouth daily.  . metFORMIN (GLUCOPHAGE) 500 MG tablet Take 1 tablet (500 mg total) by mouth daily with breakfast.  . metoprolol succinate (TOPROL-XL) 25 MG 24 hr tablet Take 1 tablet (25 mg total) by mouth daily.  Marland Kitchen omeprazole (PRILOSEC) 40 MG capsule TAKE 1 CAPSULE(40 MG) BY MOUTH BID  . polyethylene glycol (MIRALAX / GLYCOLAX) 17 g packet Take by mouth.  . senna (SENOKOT) 8.6 MG tablet Take 2 tablets by mouth at bedtime.  . traZODone (DESYREL) 50 MG tablet Take 50 mg by mouth at bedtime.  . vitamin B-12 (CYANOCOBALAMIN) 1000 MCG tablet Take 1,000 mcg by mouth 2 (two) times daily.   No facility-administered encounter medications on file as of 07/15/2023.  Per Hospitalist: "Hospital Course:  [ ]  labs Monday to check BMP and mag  Lisa Galvan is a 63 y.o. female with PMHx of peripheral neuropathy, DM, HTN, possible vascular dementia, and alcohol use disorder who presented to [facility] on 05/22/23 with progressive acute on chronic tremors in all extremities, new onset AMS, audiovisual hallucinations, and b/l leg swelling that has worsened in the 3 days prior to admission.  Patient was transferred to MICU for hypoglycemia, hypotension, and altered mental status. Mental status gradually improved with time, ongoing cessation of alcohol and IV thiamine. Suspect underlying neurocognative impairment but given improvement in all sympotms and confusion will plan on dc to SNF for rehab and follow up with PCP. Below are more details of her stay at [facility] according to problem:   Audiovisual hallucinations- resolved Acute toxic metabolic encephalopathy-  resolved Myoclonus-resolved PMH neurocognitive impairment PMH tremor PMH Alcohol Use Disorder Patient presented to hospital with at least 1.5 years of gradually declining mental status, with a one month history of rapidly declining functional status with new falls and myoclonic movements. During first part of her stay at [facility] had little meaningful improvement in any of these with treatment of cystitis, thiamine repletion, down-titration of gabapentin, and electrolyte replacement. Fluoro-guided LP 2/4 without remarkable basic CSF studies. Still unknown etiology of mental status change but is likely worsening terminal dementia with some component of possible delirium. Cosyntropin stim test normal, urine heavy metals screen was positive for elevated levels of Cadmium and Lead. Still unclear the etiology of AMS but feel there is likely an underlying korsakoff syndrome given extensive AUD. Mental status has continued to improve and she is alert and oriented and myoclonus resolved. Would Cont home gabapentin, thiamine and delirium precautions. Have continued gabapetin for hx of neuropathic pain and trazazdone to help with mood and sleep.   Hypotension likely secondary to hypovolemia- resolved Patient hypotension during RR on 2/6 requiring NE. Hypotension likely 2/2 hypovolemia iso minimal PO intake with AMS. Sepsis possible, however no clear source of infection. Held off on antibiotics at this time. Bcx, UA, Ucx showed now growth. S/p 2L LR. Started on NE gtt, initially requiring 8, now weaned off for MAP goal > 65. BP continues to be improved and would encourage good oral intake ongoing.   C/f torsades-resolved Of note during RR, c/f torsades, however per cardiology more likely artifact.   Proteus mirabilis UTI (resolved) Patient presented with malodorous urine per daughter. Treated for proteus UTI 3 weeks ago. Phys exam notable for uprapubic pain to palpation. UA in the ED showed large LE, rare bacteria, no  WBC. Urine cultures growing Proteus mirabilis. Given that she previously failed oral therapy. S/p treatment with Ampicillin.  Hypothermia  Concern for adrenal insufficiency (resolved) Patient hypothermic as low as 34.7 during stay. Confirmed with multiple thermometers. Obtained morning cortisol which was 5.9, unable to rule out adrenal insufficiency. Cosyntropin Stim Test wnl. Now improved and problem resolved and with unclear etiology.   Malnutrition  Hypomagnesemia  Hypokalemia  Folate deficiency  hypoalbuminemia  Copper deficiency  Zinc deficiency  Hypoglycemia Electrolyte abnormalities likele 2/2 relatively recent heavy alcohol consumption and intermittently poor PO intake per daughter. Patient noted to have low folate. Labs monitored and repleted zinc, folate, copper, B3 and RD followed during stay and low at times. Now alert and taking in orals and would continue vitamin replacements with monitoring of labs outpatient, use supplements in between meals and consider outpatient RD and at SNF. Agree with RD assessment of moderate malnutrition. Continue oral mag replacement scheduled and would check labs at [facility] on Monday, March 3rd.   Non-severe (Moderate) Protein-Calorie Malnutrition in the context of acute illness or injury (06/11/23 1725) Energy Intake: < 75% of estimated energy requirement for > 7 days Fat Loss: Mild Muscle Loss: Mild Malnutrition Score: 3  BLE pitting edema (improved) Hepatic Steatosis  Elevated LFTs Likely 2/2 hypoalbuminemia (primarily driven by malnutrition). Echo  unremarkable. BNP WNL, TSH normal. Patient presents with elevated transaminases, hypoalbuminemia, normal platelets, normal INR, so lower suspicion for portal hypertension as contributor. Continue statin.   Left proximal and calf DVT: likely provoked iso immobility and acute illness. Was started on eliquis and will continue with recs for 3 months duration.   T2DM: on ozempic and metformin at home. Used  sliding scale insulin and BG mostly normal. Will dc to SNF with metformin only for now and can re-eval in outpatient setting.   OSA: noncompliant with CPAP, not used during this hospitalization; therefore, will not initiate upon discharge to SNF  Mood disorder Continue home duloxetine 60mg  daily   Diagnostic Tests Reviewed/Disposition:   Consults: neurology  Discharge Instructions  Disease/illness Education:  Home Health/Community Services Discussions/Referrals:  Establishment or re-establishment of referral orders for community resources:  Discussion with other health care providers:  Assessment and Support of treatment regimen adherence:  Appointments Coordinated with:   Education for self-management, independent living, and ADLs:   Since getting out of the [place]  Relevant past medical, surgical, family and social history reviewed and updated as indicated. Interim medical history since our last visit reviewed. Allergies and medications reviewed and updated.  Review of Systems  Per HPI unless specifically indicated above     Objective:    BP 134/84   Pulse 73   Temp 98 F (36.7 C) (Oral)   Wt 145 lb (65.8 kg)   SpO2 98%   BMI 26.52 kg/m   Wt Readings from Last 3 Encounters:  07/15/23 145 lb (65.8 kg)  05/20/23 147 lb (66.7 kg)  05/03/23 147 lb (66.7 kg)    Physical Exam  Results for orders placed or performed in visit on 05/20/23  Urinalysis, Routine w reflex microscopic   Collection Time: 05/20/23 11:37 AM  Result Value Ref Range   Specific Gravity, UA <1.005 (L) 1.005 - 1.030   pH, UA 6.0 5.0 - 7.5   Color, UA Yellow Yellow   Appearance Ur Clear Clear   Leukocytes,UA Negative Negative   Protein,UA Negative Negative/Trace   Glucose, UA Negative Negative   Ketones, UA Negative Negative   RBC, UA Negative Negative   Bilirubin, UA Negative Negative   Urobilinogen, Ur 0.2 0.2 - 1.0 mg/dL   Nitrite, UA Negative Negative   Microscopic Examination  Comment       Assessment & Plan:   Problem List Items Addressed This Visit       Endocrine   Diabetic peripheral neuropathy (HCC) (Chronic)   Relevant Medications   traZODone (DESYREL) 50 MG tablet   Other Relevant Orders   Bayer DCA Hb A1c Waived     Other   Hypomagnesemia   Relevant Orders   Magnesium   Comp Met (CMET)   Hypokalemia   Relevant Orders   Comp Met (CMET)   Other Visit Diagnoses       Acute deep vein thrombosis (DVT) of other specified vein of left lower extremity (HCC)    -  Primary   Relevant Orders   CBC with Differential/Platelet   Comp Met (CMET)     Malnutrition of moderate degree (HCC)       Relevant Orders   Comp Met (CMET)     Folate deficiency       Relevant Orders   Folate   Comp Met (CMET)     Copper deficiency       Relevant Orders   Copper, Blood   Comp Met (CMET)  Zinc deficiency       Relevant Orders   Zinc   Comp Met (CMET)     Hypoglycemia       Relevant Orders   Comp Met (CMET)     Hypotension due to hypovolemia       Relevant Orders   Comp Met (CMET)     Altered mental status, unspecified altered mental status type       Relevant Orders   Comp Met (CMET)     History of UTI       Relevant Orders   Urinalysis, Routine w reflex microscopic   Urine Culture   Comp Met (CMET)     Elevated LFTs       Relevant Orders   Comp Met (CMET)        Follow up plan: No follow-ups on file.

## 2023-07-16 ENCOUNTER — Encounter: Payer: Self-pay | Admitting: Family Medicine

## 2023-07-16 DIAGNOSIS — E519 Thiamine deficiency, unspecified: Secondary | ICD-10-CM | POA: Insufficient documentation

## 2023-07-16 DIAGNOSIS — K760 Fatty (change of) liver, not elsewhere classified: Secondary | ICD-10-CM | POA: Diagnosis not present

## 2023-07-16 DIAGNOSIS — I82409 Acute embolism and thrombosis of unspecified deep veins of unspecified lower extremity: Secondary | ICD-10-CM | POA: Insufficient documentation

## 2023-07-16 DIAGNOSIS — E440 Moderate protein-calorie malnutrition: Secondary | ICD-10-CM | POA: Diagnosis not present

## 2023-07-16 DIAGNOSIS — F39 Unspecified mood [affective] disorder: Secondary | ICD-10-CM | POA: Diagnosis not present

## 2023-07-16 DIAGNOSIS — E785 Hyperlipidemia, unspecified: Secondary | ICD-10-CM | POA: Diagnosis not present

## 2023-07-16 DIAGNOSIS — I824Y2 Acute embolism and thrombosis of unspecified deep veins of left proximal lower extremity: Secondary | ICD-10-CM | POA: Diagnosis not present

## 2023-07-16 DIAGNOSIS — I824Z2 Acute embolism and thrombosis of unspecified deep veins of left distal lower extremity: Secondary | ICD-10-CM | POA: Diagnosis not present

## 2023-07-16 DIAGNOSIS — R251 Tremor, unspecified: Secondary | ICD-10-CM | POA: Diagnosis not present

## 2023-07-16 DIAGNOSIS — K219 Gastro-esophageal reflux disease without esophagitis: Secondary | ICD-10-CM | POA: Diagnosis not present

## 2023-07-16 DIAGNOSIS — D649 Anemia, unspecified: Secondary | ICD-10-CM | POA: Diagnosis not present

## 2023-07-16 DIAGNOSIS — E610 Copper deficiency: Secondary | ICD-10-CM | POA: Insufficient documentation

## 2023-07-16 DIAGNOSIS — E876 Hypokalemia: Secondary | ICD-10-CM | POA: Diagnosis not present

## 2023-07-16 DIAGNOSIS — E11649 Type 2 diabetes mellitus with hypoglycemia without coma: Secondary | ICD-10-CM | POA: Diagnosis not present

## 2023-07-16 DIAGNOSIS — E60 Dietary zinc deficiency: Secondary | ICD-10-CM | POA: Insufficient documentation

## 2023-07-16 DIAGNOSIS — E538 Deficiency of other specified B group vitamins: Secondary | ICD-10-CM | POA: Diagnosis not present

## 2023-07-16 DIAGNOSIS — E1142 Type 2 diabetes mellitus with diabetic polyneuropathy: Secondary | ICD-10-CM | POA: Diagnosis not present

## 2023-07-16 DIAGNOSIS — I959 Hypotension, unspecified: Secondary | ICD-10-CM | POA: Diagnosis not present

## 2023-07-16 DIAGNOSIS — G4733 Obstructive sleep apnea (adult) (pediatric): Secondary | ICD-10-CM | POA: Diagnosis not present

## 2023-07-16 DIAGNOSIS — E559 Vitamin D deficiency, unspecified: Secondary | ICD-10-CM | POA: Diagnosis not present

## 2023-07-16 MED ORDER — DULOXETINE HCL 60 MG PO CPEP: 60.0000 mg | ORAL_CAPSULE | Freq: Every day | ORAL | 1 refills | Status: DC

## 2023-07-16 MED ORDER — METFORMIN HCL 500 MG PO TABS: 500.0000 mg | ORAL_TABLET | Freq: Every day | ORAL | 0 refills | Status: DC

## 2023-07-16 MED ORDER — NEPHRO-VITE RX 1 MG PO TABS: 1.0000 | ORAL_TABLET | Freq: Every day | ORAL | 3 refills | Status: AC

## 2023-07-16 MED ORDER — APIXABAN 5 MG PO TABS
5.0000 mg | ORAL_TABLET | Freq: Two times a day (BID) | ORAL | 0 refills | Status: DC
Start: 2023-07-16 — End: 2023-08-28

## 2023-07-16 MED ORDER — FOLIC ACID 1 MG PO TABS: 1.0000 mg | ORAL_TABLET | Freq: Every day | ORAL | 3 refills | Status: AC

## 2023-07-16 MED ORDER — VITAMIN D3 125 MCG (5000 UT) PO CAPS: 5000.0000 [IU] | ORAL_CAPSULE | Freq: Every day | ORAL | 3 refills | Status: AC

## 2023-07-16 MED ORDER — THIAMINE MONONITRATE 100 MG PO TABS: 100.0000 mg | ORAL_TABLET | Freq: Every day | ORAL | 3 refills | Status: DC

## 2023-07-16 MED ORDER — MAGNESIUM OXIDE -MG SUPPLEMENT 500 MG PO TABS: 500.0000 mg | ORAL_TABLET | Freq: Every day | ORAL | 3 refills | Status: AC

## 2023-07-16 MED ORDER — GABAPENTIN 400 MG PO CAPS: 400.0000 mg | ORAL_CAPSULE | Freq: Every day | ORAL | 0 refills | Status: DC

## 2023-07-16 MED ORDER — TRAZODONE HCL 50 MG PO TABS: 50.0000 mg | ORAL_TABLET | Freq: Every evening | ORAL | 1 refills | Status: DC | PRN

## 2023-07-16 MED ORDER — OMEPRAZOLE 40 MG PO CPDR: DELAYED_RELEASE_CAPSULE | ORAL | 1 refills | Status: DC

## 2023-07-16 MED ORDER — GABAPENTIN 100 MG PO CAPS: 200.0000 mg | ORAL_CAPSULE | Freq: Every morning | ORAL | 0 refills | Status: DC

## 2023-07-16 MED ORDER — ATORVASTATIN CALCIUM 20 MG PO TABS: 20.0000 mg | ORAL_TABLET | Freq: Every day | ORAL | 1 refills | Status: DC

## 2023-07-16 NOTE — Assessment & Plan Note (Signed)
 Under good control on current regimen. Continue current regimen. Continue to monitor. Call with any concerns. Refills given. Labs drawn today.

## 2023-07-16 NOTE — Assessment & Plan Note (Signed)
 Rechecking labs today. Await results. Treat as needed.

## 2023-07-16 NOTE — Assessment & Plan Note (Signed)
Continue eliquis. Call with any concerns.

## 2023-07-16 NOTE — Assessment & Plan Note (Signed)
 Eating better. Continue to monitor. Labs drawn today. Await results.

## 2023-07-17 DIAGNOSIS — K219 Gastro-esophageal reflux disease without esophagitis: Secondary | ICD-10-CM | POA: Diagnosis not present

## 2023-07-17 DIAGNOSIS — G4733 Obstructive sleep apnea (adult) (pediatric): Secondary | ICD-10-CM | POA: Diagnosis not present

## 2023-07-17 DIAGNOSIS — E559 Vitamin D deficiency, unspecified: Secondary | ICD-10-CM | POA: Diagnosis not present

## 2023-07-17 DIAGNOSIS — F39 Unspecified mood [affective] disorder: Secondary | ICD-10-CM | POA: Diagnosis not present

## 2023-07-17 DIAGNOSIS — I959 Hypotension, unspecified: Secondary | ICD-10-CM | POA: Diagnosis not present

## 2023-07-17 DIAGNOSIS — E11649 Type 2 diabetes mellitus with hypoglycemia without coma: Secondary | ICD-10-CM | POA: Diagnosis not present

## 2023-07-17 DIAGNOSIS — E785 Hyperlipidemia, unspecified: Secondary | ICD-10-CM | POA: Diagnosis not present

## 2023-07-17 DIAGNOSIS — E538 Deficiency of other specified B group vitamins: Secondary | ICD-10-CM | POA: Diagnosis not present

## 2023-07-17 DIAGNOSIS — D649 Anemia, unspecified: Secondary | ICD-10-CM | POA: Diagnosis not present

## 2023-07-17 DIAGNOSIS — K760 Fatty (change of) liver, not elsewhere classified: Secondary | ICD-10-CM | POA: Diagnosis not present

## 2023-07-17 DIAGNOSIS — E440 Moderate protein-calorie malnutrition: Secondary | ICD-10-CM | POA: Diagnosis not present

## 2023-07-17 DIAGNOSIS — I824Y2 Acute embolism and thrombosis of unspecified deep veins of left proximal lower extremity: Secondary | ICD-10-CM | POA: Diagnosis not present

## 2023-07-17 DIAGNOSIS — E876 Hypokalemia: Secondary | ICD-10-CM | POA: Diagnosis not present

## 2023-07-17 DIAGNOSIS — R251 Tremor, unspecified: Secondary | ICD-10-CM | POA: Diagnosis not present

## 2023-07-17 DIAGNOSIS — I824Z2 Acute embolism and thrombosis of unspecified deep veins of left distal lower extremity: Secondary | ICD-10-CM | POA: Diagnosis not present

## 2023-07-17 DIAGNOSIS — E1142 Type 2 diabetes mellitus with diabetic polyneuropathy: Secondary | ICD-10-CM | POA: Diagnosis not present

## 2023-07-18 ENCOUNTER — Other Ambulatory Visit: Payer: Self-pay | Admitting: Family Medicine

## 2023-07-18 ENCOUNTER — Encounter: Payer: Self-pay | Admitting: Family Medicine

## 2023-07-18 LAB — COMPREHENSIVE METABOLIC PANEL WITH GFR
ALT: 9 IU/L (ref 0–32)
AST: 19 IU/L (ref 0–40)
Albumin: 3.6 g/dL — ABNORMAL LOW (ref 3.9–4.9)
Alkaline Phosphatase: 145 IU/L — ABNORMAL HIGH (ref 44–121)
BUN/Creatinine Ratio: 16 (ref 12–28)
BUN: 10 mg/dL (ref 8–27)
Bilirubin Total: 0.4 mg/dL (ref 0.0–1.2)
CO2: 24 mmol/L (ref 20–29)
Calcium: 9.8 mg/dL (ref 8.7–10.3)
Chloride: 102 mmol/L (ref 96–106)
Creatinine, Ser: 0.64 mg/dL (ref 0.57–1.00)
Globulin, Total: 2.2 g/dL (ref 1.5–4.5)
Glucose: 74 mg/dL (ref 70–99)
Potassium: 3.7 mmol/L (ref 3.5–5.2)
Sodium: 143 mmol/L (ref 134–144)
Total Protein: 5.8 g/dL — ABNORMAL LOW (ref 6.0–8.5)
eGFR: 99 mL/min/{1.73_m2} (ref >59)

## 2023-07-18 LAB — CBC WITH DIFFERENTIAL/PLATELET
Basophils Absolute: 0 10*3/uL (ref 0.0–0.2)
Basos: 1 %
EOS (ABSOLUTE): 0 10*3/uL (ref 0.0–0.4)
Eos: 1 %
Hematocrit: 33.3 % — ABNORMAL LOW (ref 34.0–46.6)
Hemoglobin: 11 g/dL — ABNORMAL LOW (ref 11.1–15.9)
Immature Grans (Abs): 0 10*3/uL (ref 0.0–0.1)
Immature Granulocytes: 0 %
Lymphocytes Absolute: 1.6 10*3/uL (ref 0.7–3.1)
Lymphs: 42 %
MCH: 30.3 pg (ref 26.6–33.0)
MCHC: 33 g/dL (ref 31.5–35.7)
MCV: 92 fL (ref 79–97)
Monocytes Absolute: 0.3 10*3/uL (ref 0.1–0.9)
Monocytes: 8 %
Neutrophils Absolute: 1.9 10*3/uL (ref 1.4–7.0)
Neutrophils: 48 %
Platelets: 328 10*3/uL (ref 150–450)
RBC: 3.63 x10E6/uL — ABNORMAL LOW (ref 3.77–5.28)
RDW: 13.7 % (ref 11.7–15.4)
WBC: 3.9 10*3/uL (ref 3.4–10.8)

## 2023-07-18 LAB — VITAMIN B1: Thiamine: 174.1 nmol/L (ref 66.5–200.0)

## 2023-07-18 LAB — MAGNESIUM: Magnesium: 1.2 mg/dL — ABNORMAL LOW (ref 1.6–2.3)

## 2023-07-18 LAB — URINE CULTURE

## 2023-07-18 LAB — COPPER, SERUM: Copper: 97 ug/dL (ref 80–158)

## 2023-07-18 LAB — ZINC: Zinc: 80 ug/dL (ref 44–115)

## 2023-07-18 LAB — FOLATE

## 2023-07-18 MED ORDER — NITROFURANTOIN MONOHYD MACRO 100 MG PO CAPS: 100.0000 mg | ORAL_CAPSULE | Freq: Two times a day (BID) | ORAL | 0 refills | Status: DC

## 2023-07-22 ENCOUNTER — Telehealth: Payer: Self-pay | Admitting: *Deleted

## 2023-07-22 NOTE — Patient Outreach (Signed)
 Care Coordination   07/22/2023 Name: Lisa Galvan MRN: [id number] DOB: Jan 24, 1961   Care Coordination Outreach Attempts:  An unsuccessful telephone outreach was attempted today to offer the patient information about available complex care management services.  Follow Up Plan:  Additional outreach attempts will be made to offer the patient complex care management information and services.   Encounter Outcome:  No Answer   Care Coordination Interventions:  No, not indicated    Rodney Langton, RN, MSN, CCM [place]  [facility], [facility] RN Care Coordinator Direct Dial: [phone] / Main [phone] Fax [phone] Email: [email or web address] Website: [email or web address]

## 2023-07-22 NOTE — Addendum Note (Signed)
 Addended by: Dorcas Carrow on: 07/22/2023 08:58 AM   Modules accepted: Level of Service

## 2023-07-22 NOTE — Patient Outreach (Signed)
 Care Coordination   Follow Up Visit Note   07/22/2023 Name: Lisa Galvan MRN: [id number] DOB: [birth year]  Anara Cowman is a 63 y.o. year old female who sees Dorcas Carrow, DO for primary care. I spoke with  [name] by phone today.  What matters to the patients health and wellness today?  Has had multiple ED visits for falls since last outreach, admitted to [facility] hospital from 1/29-2/28, then in rehab 2/28-3/21.  Happy to be home now, has family available for support.      Goals Addressed             This Visit's Progress    Effective management of chronic health conditions   On track      Interventions Today    Flowsheet Row Most Recent Value  Chronic Disease   Chronic disease during today's visit Other  [recent falls where she hit her head and DVT]  General Interventions   General Interventions Discussed/Reviewed General Interventions Reviewed, Doctor Visits  [Active with [facility], state PT was to be there today between 11-12, has not arrived yet. Confirmed she has contact information, encourged to call]  Doctor Visits Discussed/Reviewed Doctor Visits Reviewed, PCP, Specialist  [PCP scheduled for 4/21, will call neuro to schedule missed appointment due to hospitalization]  PCP/Specialist Visits --  [Seen by PCP since SNF discharge]  Exercise Interventions   Exercise Discussed/Reviewed Physical Activity  Physical Activity Discussed/Reviewed Physical Activity Reviewed  [Encouraged active participation with home health PT to increase strength]  Education Interventions   Education Provided Provided Education  Provided Verbal Education On Blood Sugar Monitoring, Medication, When to see the doctor, Nutrition  [Meds reviewed, mostly vitamins, report compliance. Continues to monitor blood sugars, A1C is below goal]  Mental Health Interventions   Mental Health Discussed/Reviewed Grief and Loss, Mental Health Reviewed  [report losing mom in December, aware of grief counseling that is  available if needed]  Nutrition Interventions   Nutrition Discussed/Reviewed Supplemental nutrition, Nutrition Reviewed, Adding fruits and vegetables, Increasing proteins              SDOH assessments and interventions completed:  No     Care Coordination Interventions:  Yes, provided   Follow up plan: Follow up call scheduled for 4/14    Encounter Outcome:  Patient Visit Completed   Rodney Langton, RN, MSN, CCM [place]  [facility], [facility] RN Care Coordinator Direct Dial: [phone] / Main [phone] Fax [phone] Email: [email or web address] Website: [email or web address]

## 2023-07-23 ENCOUNTER — Telehealth: Payer: Self-pay | Admitting: Family Medicine

## 2023-07-23 NOTE — Telephone Encounter (Signed)
 OK for verbal orders?

## 2023-07-23 NOTE — Telephone Encounter (Signed)
 Copied from CRM [id number]. Topic: Clinical - Home Health Verbal Orders >> Jul 17, 2023  9:31 AM Turkey B wrote: Caller/Agency: Judeth Cornfield from [facility] Number: [phone] Service Requested: Occupational Therapy Frequency: 1x3 1 time every other week for visits [phone] Any new concerns about the patient? no

## 2023-07-23 NOTE — Telephone Encounter (Signed)
Called and gave verbal orders per Dr. Wynetta Emery.

## 2023-07-25 DIAGNOSIS — K760 Fatty (change of) liver, not elsewhere classified: Secondary | ICD-10-CM

## 2023-07-25 DIAGNOSIS — I824Y2 Acute embolism and thrombosis of unspecified deep veins of left proximal lower extremity: Secondary | ICD-10-CM | POA: Diagnosis not present

## 2023-07-25 DIAGNOSIS — D649 Anemia, unspecified: Secondary | ICD-10-CM

## 2023-07-25 DIAGNOSIS — E1142 Type 2 diabetes mellitus with diabetic polyneuropathy: Secondary | ICD-10-CM | POA: Diagnosis not present

## 2023-07-25 DIAGNOSIS — E440 Moderate protein-calorie malnutrition: Secondary | ICD-10-CM

## 2023-07-25 DIAGNOSIS — G4733 Obstructive sleep apnea (adult) (pediatric): Secondary | ICD-10-CM

## 2023-07-25 DIAGNOSIS — F39 Unspecified mood [affective] disorder: Secondary | ICD-10-CM

## 2023-07-25 DIAGNOSIS — K219 Gastro-esophageal reflux disease without esophagitis: Secondary | ICD-10-CM

## 2023-07-25 DIAGNOSIS — I824Z2 Acute embolism and thrombosis of unspecified deep veins of left distal lower extremity: Secondary | ICD-10-CM | POA: Diagnosis not present

## 2023-07-25 DIAGNOSIS — Z7984 Long term (current) use of oral hypoglycemic drugs: Secondary | ICD-10-CM

## 2023-07-25 DIAGNOSIS — E11649 Type 2 diabetes mellitus with hypoglycemia without coma: Secondary | ICD-10-CM | POA: Diagnosis not present

## 2023-07-25 DIAGNOSIS — E785 Hyperlipidemia, unspecified: Secondary | ICD-10-CM

## 2023-07-26 ENCOUNTER — Telehealth: Payer: Self-pay

## 2023-07-26 DIAGNOSIS — M4722 Other spondylosis with radiculopathy, cervical region: Secondary | ICD-10-CM

## 2023-07-26 DIAGNOSIS — R260 Ataxic gait: Secondary | ICD-10-CM | POA: Diagnosis not present

## 2023-07-26 DIAGNOSIS — K760 Fatty (change of) liver, not elsewhere classified: Secondary | ICD-10-CM | POA: Diagnosis not present

## 2023-07-26 DIAGNOSIS — R42 Dizziness and giddiness: Secondary | ICD-10-CM | POA: Diagnosis not present

## 2023-07-26 DIAGNOSIS — E1140 Type 2 diabetes mellitus with diabetic neuropathy, unspecified: Secondary | ICD-10-CM | POA: Diagnosis not present

## 2023-07-26 DIAGNOSIS — M51369 Other intervertebral disc degeneration, lumbar region without mention of lumbar back pain or lower extremity pain: Secondary | ICD-10-CM

## 2023-07-26 NOTE — Telephone Encounter (Signed)
 Copied from CRM [id number]. Topic: General - Other >> Jul 26, 2023 11:17 AM Jon Gills C wrote: Reason for CRM: Patient called in stating she has still have not had anyone come out to her home to assist her with Physical Therapy would like for Dr.Johnson so call in and send her someone else, not wellness has they have never showed up to assist her with physical therapy

## 2023-07-29 NOTE — Telephone Encounter (Signed)
 Can we reach out to patient regarding her medicaid/medicare status- I know her daughter was working on it? Home health is going to be $160 a visit with her current insurance and she had said that she didn't want to pay that- which is why no one has come. I'm happy to put in a referral for her to go to PT as an outpatient, but home health is a little more complicated.

## 2023-07-29 NOTE — Telephone Encounter (Signed)
 Previous referral was placed by hospital and it looks like it was autoclosed- can we reopen it?

## 2023-07-30 ENCOUNTER — Telehealth: Payer: Self-pay

## 2023-07-30 DIAGNOSIS — E11649 Type 2 diabetes mellitus with hypoglycemia without coma: Secondary | ICD-10-CM | POA: Diagnosis not present

## 2023-07-30 DIAGNOSIS — E785 Hyperlipidemia, unspecified: Secondary | ICD-10-CM | POA: Diagnosis not present

## 2023-07-30 DIAGNOSIS — E538 Deficiency of other specified B group vitamins: Secondary | ICD-10-CM | POA: Diagnosis not present

## 2023-07-30 DIAGNOSIS — I959 Hypotension, unspecified: Secondary | ICD-10-CM | POA: Diagnosis not present

## 2023-07-30 DIAGNOSIS — D649 Anemia, unspecified: Secondary | ICD-10-CM | POA: Diagnosis not present

## 2023-07-30 DIAGNOSIS — E876 Hypokalemia: Secondary | ICD-10-CM | POA: Diagnosis not present

## 2023-07-30 DIAGNOSIS — E559 Vitamin D deficiency, unspecified: Secondary | ICD-10-CM | POA: Diagnosis not present

## 2023-07-30 DIAGNOSIS — E1142 Type 2 diabetes mellitus with diabetic polyneuropathy: Secondary | ICD-10-CM | POA: Diagnosis not present

## 2023-07-30 DIAGNOSIS — R251 Tremor, unspecified: Secondary | ICD-10-CM | POA: Diagnosis not present

## 2023-07-30 DIAGNOSIS — K219 Gastro-esophageal reflux disease without esophagitis: Secondary | ICD-10-CM | POA: Diagnosis not present

## 2023-07-30 DIAGNOSIS — I824Z2 Acute embolism and thrombosis of unspecified deep veins of left distal lower extremity: Secondary | ICD-10-CM | POA: Diagnosis not present

## 2023-07-30 DIAGNOSIS — E440 Moderate protein-calorie malnutrition: Secondary | ICD-10-CM | POA: Diagnosis not present

## 2023-07-30 DIAGNOSIS — K760 Fatty (change of) liver, not elsewhere classified: Secondary | ICD-10-CM | POA: Diagnosis not present

## 2023-07-30 DIAGNOSIS — F39 Unspecified mood [affective] disorder: Secondary | ICD-10-CM | POA: Diagnosis not present

## 2023-07-30 DIAGNOSIS — G4733 Obstructive sleep apnea (adult) (pediatric): Secondary | ICD-10-CM | POA: Diagnosis not present

## 2023-07-30 DIAGNOSIS — I824Y2 Acute embolism and thrombosis of unspecified deep veins of left proximal lower extremity: Secondary | ICD-10-CM | POA: Diagnosis not present

## 2023-07-30 NOTE — Telephone Encounter (Signed)
 OK for verbal orders?

## 2023-07-30 NOTE — Telephone Encounter (Signed)
Called and gave verbal orders per Dr. Wynetta Emery.

## 2023-07-30 NOTE — Telephone Encounter (Signed)
 Copied from CRM [id number]. Topic: Clinical - Home Health Verbal Orders >> Jul 30, 2023  1:27 PM Patsy Lager T wrote: Caller/Agency: Dawn from [facility] Callback Number: [phone] Service Requested: Physical Therapy Frequency: PT Eval Any new concerns about the patient? No

## 2023-07-31 DIAGNOSIS — E1142 Type 2 diabetes mellitus with diabetic polyneuropathy: Secondary | ICD-10-CM | POA: Diagnosis not present

## 2023-07-31 DIAGNOSIS — K219 Gastro-esophageal reflux disease without esophagitis: Secondary | ICD-10-CM | POA: Diagnosis not present

## 2023-07-31 DIAGNOSIS — D649 Anemia, unspecified: Secondary | ICD-10-CM | POA: Diagnosis not present

## 2023-07-31 DIAGNOSIS — E785 Hyperlipidemia, unspecified: Secondary | ICD-10-CM | POA: Diagnosis not present

## 2023-07-31 DIAGNOSIS — K760 Fatty (change of) liver, not elsewhere classified: Secondary | ICD-10-CM | POA: Diagnosis not present

## 2023-07-31 DIAGNOSIS — R251 Tremor, unspecified: Secondary | ICD-10-CM | POA: Diagnosis not present

## 2023-07-31 DIAGNOSIS — I824Y2 Acute embolism and thrombosis of unspecified deep veins of left proximal lower extremity: Secondary | ICD-10-CM | POA: Diagnosis not present

## 2023-07-31 DIAGNOSIS — F39 Unspecified mood [affective] disorder: Secondary | ICD-10-CM | POA: Diagnosis not present

## 2023-07-31 DIAGNOSIS — E559 Vitamin D deficiency, unspecified: Secondary | ICD-10-CM | POA: Diagnosis not present

## 2023-07-31 DIAGNOSIS — G4733 Obstructive sleep apnea (adult) (pediatric): Secondary | ICD-10-CM | POA: Diagnosis not present

## 2023-07-31 DIAGNOSIS — I824Z2 Acute embolism and thrombosis of unspecified deep veins of left distal lower extremity: Secondary | ICD-10-CM | POA: Diagnosis not present

## 2023-07-31 DIAGNOSIS — I959 Hypotension, unspecified: Secondary | ICD-10-CM | POA: Diagnosis not present

## 2023-07-31 DIAGNOSIS — E538 Deficiency of other specified B group vitamins: Secondary | ICD-10-CM | POA: Diagnosis not present

## 2023-07-31 DIAGNOSIS — E876 Hypokalemia: Secondary | ICD-10-CM | POA: Diagnosis not present

## 2023-07-31 DIAGNOSIS — E11649 Type 2 diabetes mellitus with hypoglycemia without coma: Secondary | ICD-10-CM | POA: Diagnosis not present

## 2023-07-31 DIAGNOSIS — E440 Moderate protein-calorie malnutrition: Secondary | ICD-10-CM | POA: Diagnosis not present

## 2023-08-02 NOTE — Telephone Encounter (Signed)
 Spoke with the patient she doesn't know any updates for Medicaid/Medicare. She is unable to afford the home health but would like to consider the option for outpatient PT. She asked that we do a benefit check and see what the cost for it would be and touch base with her again so she is she can afford.

## 2023-08-02 NOTE — Telephone Encounter (Signed)
 Home health is not covered by her commercial insurance. It would be a copay of $160 a visit. Her daughter was working on medicaid/medicare status. She has an existing referral for [facility] who hopefully can help with this. She should not need a new referral. If she wants outpatient PT I'm happy to refer her to that- otherwise can we get [facility] to reach out to her since she already has a standing referral?

## 2023-08-05 ENCOUNTER — Other Ambulatory Visit: Payer: Self-pay

## 2023-08-05 ENCOUNTER — Ambulatory Visit: Payer: Self-pay | Admitting: *Deleted

## 2023-08-05 DIAGNOSIS — E440 Moderate protein-calorie malnutrition: Secondary | ICD-10-CM | POA: Diagnosis not present

## 2023-08-05 DIAGNOSIS — R251 Tremor, unspecified: Secondary | ICD-10-CM | POA: Diagnosis not present

## 2023-08-05 DIAGNOSIS — E11649 Type 2 diabetes mellitus with hypoglycemia without coma: Secondary | ICD-10-CM | POA: Diagnosis not present

## 2023-08-05 DIAGNOSIS — D649 Anemia, unspecified: Secondary | ICD-10-CM | POA: Diagnosis not present

## 2023-08-05 DIAGNOSIS — E559 Vitamin D deficiency, unspecified: Secondary | ICD-10-CM | POA: Diagnosis not present

## 2023-08-05 DIAGNOSIS — E785 Hyperlipidemia, unspecified: Secondary | ICD-10-CM | POA: Diagnosis not present

## 2023-08-05 DIAGNOSIS — E876 Hypokalemia: Secondary | ICD-10-CM | POA: Diagnosis not present

## 2023-08-05 DIAGNOSIS — I824Z2 Acute embolism and thrombosis of unspecified deep veins of left distal lower extremity: Secondary | ICD-10-CM | POA: Diagnosis not present

## 2023-08-05 DIAGNOSIS — I824Y2 Acute embolism and thrombosis of unspecified deep veins of left proximal lower extremity: Secondary | ICD-10-CM | POA: Diagnosis not present

## 2023-08-05 DIAGNOSIS — E1142 Type 2 diabetes mellitus with diabetic polyneuropathy: Secondary | ICD-10-CM | POA: Diagnosis not present

## 2023-08-05 DIAGNOSIS — K219 Gastro-esophageal reflux disease without esophagitis: Secondary | ICD-10-CM | POA: Diagnosis not present

## 2023-08-05 DIAGNOSIS — G4733 Obstructive sleep apnea (adult) (pediatric): Secondary | ICD-10-CM | POA: Diagnosis not present

## 2023-08-05 DIAGNOSIS — I959 Hypotension, unspecified: Secondary | ICD-10-CM | POA: Diagnosis not present

## 2023-08-05 DIAGNOSIS — E538 Deficiency of other specified B group vitamins: Secondary | ICD-10-CM | POA: Diagnosis not present

## 2023-08-05 DIAGNOSIS — F39 Unspecified mood [affective] disorder: Secondary | ICD-10-CM | POA: Diagnosis not present

## 2023-08-05 DIAGNOSIS — K760 Fatty (change of) liver, not elsewhere classified: Secondary | ICD-10-CM | POA: Diagnosis not present

## 2023-08-05 NOTE — Patient Instructions (Signed)
 Visit Information  Thank you for taking time to visit with me today. Please don't hesitate to contact me if I can be of assistance to you before our next scheduled appointment.  Our next appointment is by telephone on 5/1 at 1230 pm with F. McCray  Please call the care guide team at [phone] if you need to cancel or reschedule your appointment.   Following is a copy of your care plan:   Goals Addressed             This Visit's Progress    COMPLETED: Effective management of chronic health conditions   On track      Interventions Today    Flowsheet Row Most Recent Value  Chronic Disease   Chronic disease during today's visit Other  [recent falls where she hit her head and DVT]  General Interventions   General Interventions Discussed/Reviewed General Interventions Reviewed, Doctor Visits  [Active with [facility], state PT was to be there today between 11-12, has not arrived yet. Confirmed she has contact information, encourged to call]  Doctor Visits Discussed/Reviewed Doctor Visits Reviewed, PCP, Specialist  [PCP scheduled for 4/21, will call neuro to schedule missed appointment due to hospitalization]  PCP/Specialist Visits --  [Seen by PCP since SNF discharge]  Exercise Interventions   Exercise Discussed/Reviewed Physical Activity  Physical Activity Discussed/Reviewed Physical Activity Reviewed  [Encouraged active participation with home health PT to increase strength]  Education Interventions   Education Provided Provided Education  Provided Verbal Education On Blood Sugar Monitoring, Medication, When to see the doctor, Nutrition  [Meds reviewed, mostly vitamins, report compliance. Continues to monitor blood sugars, A1C is below goal]  Mental Health Interventions   Mental Health Discussed/Reviewed Grief and Loss, Mental Health Reviewed  [report losing mom in December, aware of grief counseling that is available if needed]  Nutrition Interventions   Nutrition  Discussed/Reviewed Supplemental nutrition, Nutrition Reviewed, Adding fruits and vegetables, Increasing proteins           [facility] RN Care Plan       Problems:  Chronic Disease Management support and education needs related to falls and DVT  Goal: Over the next 90 days the Patient will continue to work with Medical illustrator and/or Social Worker to address care management and care coordination needs related to falls and DVT as evidenced by adherence to CM Team Scheduled appointments     demonstrate ongoing self health care management ability begin driving again as evidenced by     not experience hospital admission as evidenced by review of EMR. Hospital Admissions in last 6 months = 1 lengthy hospital admission and one SNF admission take all medications exactly as prescribed and will call provider for medication related questions as evidenced by patient reported adherence    work with home health PT to increase strength and decrease risk of fall as evidenced by review of EMR and patient or care team member report    Interventions:   Falls Interventions: Advised patient of importance of notifying provider of falls Assessed for falls since last encounter Screening for signs and symptoms of depression related to chronic disease state  Assessed social determinant of health barriers Provided education regarding fall risk and blood thinners  Patient Self-Care Activities:  Attend all scheduled provider appointments Call provider office for new concerns or questions  Notify RN Care Manager of TOC call rescheduling needs Perform all self care activities independently  Take medications as prescribed    Plan:  The patient has  been provided with contact information for the care management team and has been advised to call with any health related questions or concerns.              Please call the Suicide and Crisis Lifeline: 988 call the [place]  National Suicide Prevention Lifeline:  (330) 252-2852 or TTY: 6716089351 TTY 650-717-1174) to talk to a trained counselor call 1-800-273-TALK (toll free, 24 hour hotline) call 911 if you are experiencing a Mental Health or Behavioral Health Crisis or need someone to talk to.  Patient verbalizes understanding of instructions and care plan provided today and agrees to view in MyChart. Active MyChart status and patient understanding of how to access instructions and care plan via MyChart confirmed with patient.     Holland Lundborg, RN, MSN, CCM [facility], [facility] RN Care Coordinator Direct Dial: [phone] / Main [phone] Fax [phone] Email: [email or web address]: [email or web address]

## 2023-08-05 NOTE — Telephone Encounter (Signed)
 We would need the outpatient referral for PT in order to see what her co-pay might be. She knows she can't do in home therapy but is open to outpatient depending on the cost.

## 2023-08-05 NOTE — Addendum Note (Signed)
 Addended by: Solomon Dupre on: 08/05/2023 09:37 AM   Modules accepted: Orders

## 2023-08-05 NOTE — Patient Outreach (Signed)
 Complex Care Management   Visit Note  08/05/2023  Name:  Lisa Galvan MRN: [id number] DOB: 06-Feb-1961  Situation: Referral received for Complex Care Management related to  falls and DVT  I obtained verbal consent from Patient.  Visit completed with Patient  on the phone  Background:   Past Medical History:  Diagnosis Date   Allergy    Anemia    Arthritis    spine   Bilateral leg edema    Chest pain 12/15/2014   Chronic sciatica 12/10/2014   COVID-19    2023   DDD (degenerative disc disease), lumbar    Essential hypertension 12/10/2014   Gastroesophageal reflux disease without esophagitis    Mixed hyperlipidemia    Neuropathy    Obesity (BMI 30.0-34.9) 12/10/2014   Pneumonia 1968   Sleep apnea    new cpap   Type 2 diabetes mellitus without complication (HCC) 12/10/2014   Umbilical hernia     Assessment: Patient Reported Symptoms:  Cognitive Cognitive Status: Alert and oriented to person, place, and time, Normal speech and language skills   Health Maintenance Behaviors: Annual physical exam Healing Pattern: Average Health Facilitated by: Healthy diet, Rest, Prayer/meditation  Neurological      HEENT HEENT Symptoms Reported: Ear pain HEENT Conditions: Ear problem(s), Pain Pain Location: ear HEENT Management Strategies: Adequate rest HEENT Comment: Appointment with ENT Ear problem(s), Pain  Cardiovascular Cardiovascular Symptoms Reported: No symptoms reported Does patient have uncontrolled Hypertension?: No    Respiratory Respiratory Symptoms Reported: No symptoms reported    Endocrine Patient reports the following symptoms related to hypoglycemia or hyperglycemia : No symptoms reported Is patient diabetic?: Yes Is patient checking blood sugars at home?: Yes Endocrine Conditions: Diabetes Endocrine Management Strategies: Medication therapy, Exercise, Diet modification  Gastrointestinal Gastrointestinal Symptoms Reported: No symptoms reported   Nutrition Risk  Screen (CP): No indicators present  Genitourinary      Integumentary Integumentary Symptoms Reported: No symptoms reported    Musculoskeletal Musculoskelatal Symptoms Reviewed: Weakness Additional Musculoskeletal Details: frequent falls, post SNF discharge, active with PT Musculoskeletal Management Strategies: Activity, Medication therapy, Exercise   Patient at Risk for Falls Due to: History of fall(s) Fall risk Follow up: Education provided, Falls evaluation completed  Psychosocial Psychosocial Symptoms Reported: No symptoms reported   Major Change/Loss/Stressor/Fears (CP): Death of a loved one Do you feel physically threatened by others?: No      07/15/2023    2:52 PM  Depression screen PHQ 2/9  Altered sleeping 0  Tired, decreased energy 0  Change in appetite 0  Feeling bad or failure about yourself  0  Trouble concentrating 0  Moving slowly or fidgety/restless 0  Suicidal thoughts 0    There were no vitals filed for this visit.  Medications Reviewed Today     Reviewed by Holland Lundborg, RN (Registered Nurse) on 08/05/23 at 1620  Med List Status: <None>   Medication Order Taking? Sig Documenting Provider Last Dose Status Informant  Accu-Chek Softclix Lancets lancets [id number] Yes TEST TWICE DAILY Lincoln Renshaw, Megan P, DO Taking Active Self  apixaban (ELIQUIS) 5 MG TABS tablet [id number] Yes Take 1 tablet (5 mg total) by mouth 2 (two) times daily. Johnson, Megan P, DO Taking Active   atorvastatin (LIPITOR) 20 MG tablet [id number] Yes Take 1 tablet (20 mg total) by mouth daily. TAKE 1 TABLET(20 MG) BY MOUTH DAILY Johnson, Megan P, DO Taking Active   B Complex-C-Folic Acid (B COMPLEX-VITAMIN C-FOLIC ACID) 1 MG tablet [id number] Yes Take 1 tablet by mouth  daily with breakfast. Terre Ferri P, DO Taking Active   Cholecalciferol (VITAMIN D-3) 5000 units TABS [id number] Yes Take 5,000 Units by mouth daily. [provider] Taking Active Self  Cholecalciferol (VITAMIN D3) 125  MCG (5000 UT) CAPS [id number] Yes Take 1 capsule (5,000 Units total) by mouth daily. Terre Ferri P, DO Taking Active   clotrimazole-betamethasone (LOTRISONE) cream [id number] Yes Apply 1 Application topically daily. Terre Ferri P, DO Taking Active Self  diclofenac Sodium (VOLTAREN) 1 % GEL [id number] Yes Apply 4 g topically 4 (four) times daily. Terre Ferri P, DO Taking Active Self  DULoxetine (CYMBALTA) 60 MG capsule [id number] Yes Take 1 capsule (60 mg total) by mouth daily. Terre Ferri P, DO Taking Active   EPINEPHrine 0.3 mg/0.3 mL IJ SOAJ injection [id number] Yes Inject 0.3 mg into the muscle as needed for anaphylaxis. Solomon Dupre, DO Taking Active Self  folic acid (FOLVITE) 1 MG tablet [id number] Yes Take 1 tablet (1 mg total) by mouth daily. Lincoln Renshaw, Megan P, DO Taking Active   gabapentin (NEURONTIN) 100 MG capsule [id number] Yes Take 2 capsules (200 mg total) by mouth every morning. Johnson, Megan P, DO Taking Active   gabapentin (NEURONTIN) 400 MG capsule [id number] Yes Take 1 capsule (400 mg total) by mouth at bedtime. Terre Ferri P, DO Taking Active   Magnesium Oxide -Mg Supplement 500 MG TABS [id number] Yes Take 1 tablet (500 mg total) by mouth daily. Terre Ferri P, DO Taking Active   metFORMIN (GLUCOPHAGE) 500 MG tablet [id number] Yes Take 1 tablet (500 mg total) by mouth daily with breakfast. Terre Ferri P, DO Taking Active   nitrofurantoin, macrocrystal-monohydrate, (MACROBID) 100 MG capsule [id number] No Take 1 capsule (100 mg total) by mouth 2 (two) times daily.  Patient not taking: Reported on 08/05/2023   Solomon Dupre, DO Not Taking Active   omeprazole (PRILOSEC) 40 MG capsule [id number] Yes TAKE 1 CAPSULE(40 MG) BY MOUTH BID Johnson, Megan P, DO Taking Active   polyethylene glycol (MIRALAX / GLYCOLAX) 17 g packet [id number] Yes Take by mouth. [provider] Taking Active   senna (SENOKOT) 8.6 MG tablet [id number] Yes Take 2 tablets by mouth at bedtime.  [provider] Taking Active   thiamine (VITAMIN B1) 100 MG tablet [id number] Yes Take 1 tablet (100 mg total) by mouth daily. Terre Ferri P, DO Taking Active   traZODone (DESYREL) 50 MG tablet [id number] Yes Take 1 tablet (50 mg total) by mouth at bedtime as needed for sleep. Terre Ferri P, DO Taking Active   vitamin B-12 (CYANOCOBALAMIN) 1000 MCG tablet [id number] Yes Take 1,000 mcg by mouth 2 (two) times daily. [provider] Taking Active Self  Med List Note Tyson Gals, CPhT 01/31/21 1151):               Recommendation:   PCP Follow-up  Follow Up Plan:   Telephone follow up appointment date/time:  5/1 at [address]  [facility], RN, MSN, CCM [facility], [facility] RN Care Coordinator Direct Dial: [phone] / Main [phone] Fax [phone] Email: [email or web address]

## 2023-08-12 ENCOUNTER — Encounter: Payer: Self-pay | Admitting: Family Medicine

## 2023-08-12 ENCOUNTER — Ambulatory Visit: Admitting: Family Medicine

## 2023-08-12 VITALS — BP 115/75 | HR 96 | Ht 62.0 in | Wt 142.6 lb

## 2023-08-12 DIAGNOSIS — I824Z2 Acute embolism and thrombosis of unspecified deep veins of left distal lower extremity: Secondary | ICD-10-CM | POA: Diagnosis not present

## 2023-08-12 DIAGNOSIS — E1340 Other specified diabetes mellitus with diabetic neuropathy, unspecified: Secondary | ICD-10-CM | POA: Diagnosis not present

## 2023-08-12 DIAGNOSIS — E11649 Type 2 diabetes mellitus with hypoglycemia without coma: Secondary | ICD-10-CM | POA: Diagnosis not present

## 2023-08-12 DIAGNOSIS — E559 Vitamin D deficiency, unspecified: Secondary | ICD-10-CM | POA: Diagnosis not present

## 2023-08-12 DIAGNOSIS — R296 Repeated falls: Secondary | ICD-10-CM

## 2023-08-12 DIAGNOSIS — E876 Hypokalemia: Secondary | ICD-10-CM | POA: Diagnosis not present

## 2023-08-12 DIAGNOSIS — K219 Gastro-esophageal reflux disease without esophagitis: Secondary | ICD-10-CM | POA: Diagnosis not present

## 2023-08-12 DIAGNOSIS — E440 Moderate protein-calorie malnutrition: Secondary | ICD-10-CM | POA: Diagnosis not present

## 2023-08-12 DIAGNOSIS — I824Y2 Acute embolism and thrombosis of unspecified deep veins of left proximal lower extremity: Secondary | ICD-10-CM | POA: Diagnosis not present

## 2023-08-12 DIAGNOSIS — E1142 Type 2 diabetes mellitus with diabetic polyneuropathy: Secondary | ICD-10-CM | POA: Diagnosis not present

## 2023-08-12 DIAGNOSIS — I959 Hypotension, unspecified: Secondary | ICD-10-CM | POA: Diagnosis not present

## 2023-08-12 DIAGNOSIS — F39 Unspecified mood [affective] disorder: Secondary | ICD-10-CM | POA: Diagnosis not present

## 2023-08-12 DIAGNOSIS — E785 Hyperlipidemia, unspecified: Secondary | ICD-10-CM | POA: Diagnosis not present

## 2023-08-12 DIAGNOSIS — R251 Tremor, unspecified: Secondary | ICD-10-CM | POA: Diagnosis not present

## 2023-08-12 DIAGNOSIS — D649 Anemia, unspecified: Secondary | ICD-10-CM | POA: Diagnosis not present

## 2023-08-12 DIAGNOSIS — E538 Deficiency of other specified B group vitamins: Secondary | ICD-10-CM | POA: Diagnosis not present

## 2023-08-12 DIAGNOSIS — G4733 Obstructive sleep apnea (adult) (pediatric): Secondary | ICD-10-CM | POA: Diagnosis not present

## 2023-08-12 DIAGNOSIS — K760 Fatty (change of) liver, not elsewhere classified: Secondary | ICD-10-CM | POA: Diagnosis not present

## 2023-08-12 NOTE — Assessment & Plan Note (Signed)
 Has had visits with home health but it is not covered by her insurance, so she is paying out of pocket. She is to be starting PT at [facility] shortly, but hasn't started. Neurology wants her to start balance retraining. They also felt that she was not safe to be walking and wanted her to use her wheelchair all the time.

## 2023-08-12 NOTE — Progress Notes (Signed)
 BP 115/75 (BP Location: Left Arm, Patient Position: Sitting)   Pulse 96   Ht 5\' 2"  (1.575 m)   Wt 142 lb 9.6 oz (64.7 kg)   SpO2 97%   BMI 26.08 kg/m    Subjective:    Patient ID: Lisa Galvan, female    DOB: Apr 25, 1960, 63 y.o.   MRN: [id number]  HPI: Lisa Galvan is a 63 y.o. female  Chief Complaint  Patient presents with   debility   Peripheral Neuropathy   She has been having home health. She's being charged. She has done PT at home 2x. She has a referral to go see Annette Barters, but hasn't started yet.   NEUROPATHY Neuropathy status: uncontrolled  Satisfied with current treatment?: no Medication side effects: yes- falls and grogginess Medication compliance:  good compliance Location: bilateral lower legs Pain: yes Severity: severe  Quality:  burning, numb and tingling Frequency: constant Bilateral: yes Symmetric: yes Numbness: yes Decreased sensation: yes Weakness: yes Context: stable   Relevant past medical, surgical, family and social history reviewed and updated as indicated. Interim medical history since our last visit reviewed. Allergies and medications reviewed and updated.  Review of Systems  Constitutional: Negative.   Respiratory: Negative.    Cardiovascular: Negative.   Musculoskeletal: Negative.   Skin: Negative.   Neurological:  Positive for weakness and numbness. Negative for dizziness, tremors, seizures, syncope, facial asymmetry, speech difficulty, light-headedness and headaches.  Psychiatric/Behavioral: Negative.      Per HPI unless specifically indicated above     Objective:    BP 115/75 (BP Location: Left Arm, Patient Position: Sitting)   Pulse 96   Ht 5\' 2"  (1.575 m)   Wt 142 lb 9.6 oz (64.7 kg)   SpO2 97%   BMI 26.08 kg/m   Wt Readings from Last 3 Encounters:  08/12/23 142 lb 9.6 oz (64.7 kg)  07/15/23 145 lb (65.8 kg)  05/20/23 147 lb (66.7 kg)    Physical Exam Vitals and nursing note reviewed.  Constitutional:       General: She is not in acute distress.    Appearance: Normal appearance. She is not ill-appearing, toxic-appearing or diaphoretic.  HENT:     Head: Normocephalic and atraumatic.     Right Ear: External ear normal.     Left Ear: External ear normal.     Nose: Nose normal.     Mouth/Throat:     Mouth: Mucous membranes are moist.     Pharynx: Oropharynx is clear.  Eyes:     General: No scleral icterus.       Right eye: No discharge.        Left eye: No discharge.     Extraocular Movements: Extraocular movements intact.     Conjunctiva/sclera: Conjunctivae normal.     Pupils: Pupils are equal, round, and reactive to light.  Cardiovascular:     Rate and Rhythm: Normal rate and regular rhythm.     Pulses: Normal pulses.     Heart sounds: Normal heart sounds. No murmur heard.    No friction rub. No gallop.  Pulmonary:     Effort: Pulmonary effort is normal. No respiratory distress.     Breath sounds: Normal breath sounds. No stridor. No wheezing, rhonchi or rales.  Chest:     Chest wall: No tenderness.  Musculoskeletal:        General: Normal range of motion.     Cervical back: Normal range of motion and neck supple.  Skin:  General: Skin is warm and dry.     Capillary Refill: Capillary refill takes less than 2 seconds.     Coloration: Skin is not jaundiced or pale.     Findings: No bruising, erythema, lesion or rash.  Neurological:     General: No focal deficit present.     Mental Status: She is alert and oriented to person, place, and time. Mental status is at baseline.  Psychiatric:        Mood and Affect: Mood normal.        Behavior: Behavior normal.        Thought Content: Thought content normal.        Judgment: Judgment normal.     Results for orders placed or performed in visit on 07/15/23  Urine Culture   Collection Time: 07/15/23  3:18 PM   Specimen: Urine   UR  Result Value Ref Range   Urine Culture, Routine Final report (A)    Organism ID, Bacteria  Klebsiella pneumoniae (A)    Antimicrobial Susceptibility Comment   Microscopic Examination   Collection Time: 07/15/23  3:18 PM   Urine  Result Value Ref Range   WBC, UA >30 (A) 0 - 5 /hpf   RBC, Urine 0-2 0 - 2 /hpf   Epithelial Cells (non renal) 0-10 0 - 10 /hpf   Bacteria, UA Many (A) None seen/Few  Urinalysis, Routine w reflex microscopic   Collection Time: 07/15/23  3:18 PM  Result Value Ref Range   Specific Gravity, UA 1.015 1.005 - 1.030   pH, UA 6.0 5.0 - 7.5   Color, UA Yellow Yellow   Appearance Ur Cloudy (A) Clear   Leukocytes,UA 2+ (A) Negative   Protein,UA Trace Negative/Trace   Glucose, UA Negative Negative   Ketones, UA Trace (A) Negative   RBC, UA Trace (A) Negative   Bilirubin, UA Negative Negative   Urobilinogen, Ur 1.0 0.2 - 1.0 mg/dL   Nitrite, UA Negative Negative   Microscopic Examination See below:   Bayer DCA Hb A1c Waived   Collection Time: 07/15/23  3:19 PM  Result Value Ref Range   HB A1C (BAYER DCA - WAIVED) 5.1 4.8 - 5.6 %  Magnesium    Collection Time: 07/15/23  3:24 PM  Result Value Ref Range   Magnesium  1.2 (L) 1.6 - 2.3 mg/dL  CBC with Differential/Platelet   Collection Time: 07/15/23  3:24 PM  Result Value Ref Range   WBC 3.9 3.4 - 10.8 x10E3/uL   RBC 3.63 (L) 3.77 - 5.28 x10E6/uL   Hemoglobin 11.0 (L) 11.1 - 15.9 g/dL   Hematocrit 16.1 (L) 09.6 - 46.6 %   MCV 92 79 - 97 fL   MCH 30.3 26.6 - 33.0 pg   MCHC 33.0 31.5 - 35.7 g/dL   RDW 04.5 40.9 - 81.1 %   Platelets 328 150 - 450 x10E3/uL   Neutrophils 48 Not Estab. %   Lymphs 42 Not Estab. %   Monocytes 8 Not Estab. %   Eos 1 Not Estab. %   Basos 1 Not Estab. %   Neutrophils Absolute 1.9 1.4 - 7.0 x10E3/uL   Lymphocytes Absolute 1.6 0.7 - 3.1 x10E3/uL   Monocytes Absolute 0.3 0.1 - 0.9 x10E3/uL   EOS (ABSOLUTE) 0.0 0.0 - 0.4 x10E3/uL   Basophils Absolute 0.0 0.0 - 0.2 x10E3/uL   Immature Granulocytes 0 Not Estab. %   Immature Grans (Abs) 0.0 0.0 - 0.1 x10E3/uL  Folate    Collection Time: 07/15/23  3:24 PM  Result Value Ref Range   Folate >20.0 >3.0 ng/mL  Zinc    Collection Time: 07/15/23  3:24 PM  Result Value Ref Range   Zinc  80 44 - 115 ug/dL  Comp Met (CMET)   Collection Time: 07/15/23  3:24 PM  Result Value Ref Range   Glucose 74 70 - 99 mg/dL   BUN 10 8 - 27 mg/dL   Creatinine, Ser 1.30 0.57 - 1.00 mg/dL   eGFR 99 >86 VH/QIO/9.62   BUN/Creatinine Ratio 16 12 - 28   Sodium 143 134 - 144 mmol/L   Potassium 3.7 3.5 - 5.2 mmol/L   Chloride 102 96 - 106 mmol/L   CO2 24 20 - 29 mmol/L   Calcium  9.8 8.7 - 10.3 mg/dL   Total Protein 5.8 (L) 6.0 - 8.5 g/dL   Albumin 3.6 (L) 3.9 - 4.9 g/dL   Globulin, Total 2.2 1.5 - 4.5 g/dL   Bilirubin Total 0.4 0.0 - 1.2 mg/dL   Alkaline Phosphatase 145 (H) 44 - 121 IU/L   AST 19 0 - 40 IU/L   ALT 9 0 - 32 IU/L  Vitamin B1   Collection Time: 07/15/23  3:24 PM  Result Value Ref Range   Thiamine  174.1 66.5 - 200.0 nmol/L  Copper , Serum   Collection Time: 07/15/23  3:24 PM  Result Value Ref Range   Copper  97 80 - 158 ug/dL      Assessment & Plan:   Problem List Items Addressed This Visit       Endocrine   Neuropathy due to secondary diabetes (HCC)   Not significantly better. Continues to follow with neurology. To have EMG done- not currently scheduled. Continue to balance polypharmacy with neuropathic pain. Continue to monitor. Call with any concerns.         Other   Recurrent falls - Primary   Has had visits with home health but it is not covered by her insurance, so she is paying out of pocket. She is to be starting PT at [facility] shortly, but hasn't started. Neurology wants her to start balance retraining. They also felt that she was not safe to be walking and wanted her to use her wheelchair all the time.       Relevant Orders   Ambulatory referral to Physical Therapy     Follow up plan: Return in about 4 weeks (around 09/09/2023) for OK to cancel appt 5/2, physical.

## 2023-08-12 NOTE — Assessment & Plan Note (Signed)
 Not significantly better. Continues to follow with neurology. To have EMG done- not currently scheduled. Continue to balance polypharmacy with neuropathic pain. Continue to monitor. Call with any concerns.

## 2023-08-14 DIAGNOSIS — K219 Gastro-esophageal reflux disease without esophagitis: Secondary | ICD-10-CM | POA: Diagnosis not present

## 2023-08-14 DIAGNOSIS — E11649 Type 2 diabetes mellitus with hypoglycemia without coma: Secondary | ICD-10-CM | POA: Diagnosis not present

## 2023-08-14 DIAGNOSIS — I959 Hypotension, unspecified: Secondary | ICD-10-CM | POA: Diagnosis not present

## 2023-08-14 DIAGNOSIS — E538 Deficiency of other specified B group vitamins: Secondary | ICD-10-CM | POA: Diagnosis not present

## 2023-08-14 DIAGNOSIS — I824Y2 Acute embolism and thrombosis of unspecified deep veins of left proximal lower extremity: Secondary | ICD-10-CM | POA: Diagnosis not present

## 2023-08-14 DIAGNOSIS — D649 Anemia, unspecified: Secondary | ICD-10-CM | POA: Diagnosis not present

## 2023-08-14 DIAGNOSIS — E1142 Type 2 diabetes mellitus with diabetic polyneuropathy: Secondary | ICD-10-CM | POA: Diagnosis not present

## 2023-08-14 DIAGNOSIS — K760 Fatty (change of) liver, not elsewhere classified: Secondary | ICD-10-CM | POA: Diagnosis not present

## 2023-08-14 DIAGNOSIS — I824Z2 Acute embolism and thrombosis of unspecified deep veins of left distal lower extremity: Secondary | ICD-10-CM | POA: Diagnosis not present

## 2023-08-14 DIAGNOSIS — F39 Unspecified mood [affective] disorder: Secondary | ICD-10-CM | POA: Diagnosis not present

## 2023-08-14 DIAGNOSIS — E876 Hypokalemia: Secondary | ICD-10-CM | POA: Diagnosis not present

## 2023-08-14 DIAGNOSIS — G4733 Obstructive sleep apnea (adult) (pediatric): Secondary | ICD-10-CM | POA: Diagnosis not present

## 2023-08-14 DIAGNOSIS — E559 Vitamin D deficiency, unspecified: Secondary | ICD-10-CM | POA: Diagnosis not present

## 2023-08-14 DIAGNOSIS — E440 Moderate protein-calorie malnutrition: Secondary | ICD-10-CM | POA: Diagnosis not present

## 2023-08-14 DIAGNOSIS — R251 Tremor, unspecified: Secondary | ICD-10-CM | POA: Diagnosis not present

## 2023-08-14 DIAGNOSIS — E785 Hyperlipidemia, unspecified: Secondary | ICD-10-CM | POA: Diagnosis not present

## 2023-08-20 DIAGNOSIS — E1142 Type 2 diabetes mellitus with diabetic polyneuropathy: Secondary | ICD-10-CM | POA: Diagnosis not present

## 2023-08-20 DIAGNOSIS — I824Y2 Acute embolism and thrombosis of unspecified deep veins of left proximal lower extremity: Secondary | ICD-10-CM | POA: Diagnosis not present

## 2023-08-20 DIAGNOSIS — E538 Deficiency of other specified B group vitamins: Secondary | ICD-10-CM | POA: Diagnosis not present

## 2023-08-20 DIAGNOSIS — R251 Tremor, unspecified: Secondary | ICD-10-CM | POA: Diagnosis not present

## 2023-08-20 DIAGNOSIS — F39 Unspecified mood [affective] disorder: Secondary | ICD-10-CM | POA: Diagnosis not present

## 2023-08-20 DIAGNOSIS — E785 Hyperlipidemia, unspecified: Secondary | ICD-10-CM | POA: Diagnosis not present

## 2023-08-20 DIAGNOSIS — K760 Fatty (change of) liver, not elsewhere classified: Secondary | ICD-10-CM | POA: Diagnosis not present

## 2023-08-20 DIAGNOSIS — E876 Hypokalemia: Secondary | ICD-10-CM | POA: Diagnosis not present

## 2023-08-20 DIAGNOSIS — K219 Gastro-esophageal reflux disease without esophagitis: Secondary | ICD-10-CM | POA: Diagnosis not present

## 2023-08-20 DIAGNOSIS — E440 Moderate protein-calorie malnutrition: Secondary | ICD-10-CM | POA: Diagnosis not present

## 2023-08-20 DIAGNOSIS — I959 Hypotension, unspecified: Secondary | ICD-10-CM | POA: Diagnosis not present

## 2023-08-20 DIAGNOSIS — E559 Vitamin D deficiency, unspecified: Secondary | ICD-10-CM | POA: Diagnosis not present

## 2023-08-20 DIAGNOSIS — G4733 Obstructive sleep apnea (adult) (pediatric): Secondary | ICD-10-CM | POA: Diagnosis not present

## 2023-08-20 DIAGNOSIS — E11649 Type 2 diabetes mellitus with hypoglycemia without coma: Secondary | ICD-10-CM | POA: Diagnosis not present

## 2023-08-20 DIAGNOSIS — D649 Anemia, unspecified: Secondary | ICD-10-CM | POA: Diagnosis not present

## 2023-08-20 DIAGNOSIS — I824Z2 Acute embolism and thrombosis of unspecified deep veins of left distal lower extremity: Secondary | ICD-10-CM | POA: Diagnosis not present

## 2023-08-22 ENCOUNTER — Other Ambulatory Visit: Payer: Self-pay

## 2023-08-23 ENCOUNTER — Telehealth: Payer: Self-pay | Admitting: Family Medicine

## 2023-08-23 ENCOUNTER — Encounter: Admitting: Family Medicine

## 2023-08-23 NOTE — Telephone Encounter (Unsigned)
 Copied from CRM [id number]. Topic: Clinical - Medication Refill >> Aug 23, 2023  2:03 PM Hassie Lint wrote: Most Recent Primary Care Visit:  Provider: Terre Ferri P  Department: [facility]  Visit Type: OFFICE VISIT  Date: 08/12/2023  Medication: ketoconazole  (NIZORAL ) 2 % cream  Has the patient contacted their pharmacy? Yes - call Dr (Agent: If no, request that the patient contact the pharmacy for the refill. If patient does not wish to contact the pharmacy document the reason why and proceed with request.) (Agent: If yes, when and what did the pharmacy advise?)  Is this the correct pharmacy for this prescription? Yes If no, delete pharmacy and type the correct one.  This is the patient's preferred pharmacy:  [id number] - [place], [place] - [address] AT [facility] [address] [address] [place] [address] Phone: [phone] Fax: [phone]  Has the prescription been filled recently? No  Is the patient out of the medication? No  Has the patient been seen for an appointment in the last year OR does the patient have an upcoming appointment? Yes  Can we respond through MyChart? Yes  Agent: Please be advised that Rx refills may take up to 3 business days. We ask that you follow-up with your pharmacy.

## 2023-08-24 NOTE — Telephone Encounter (Signed)
 Please review for refill. Medication is not on current med list.

## 2023-08-26 MED ORDER — KETOCONAZOLE 2 % EX CREA: 1.0000 | TOPICAL_CREAM | Freq: Every day | CUTANEOUS | 0 refills | Status: DC

## 2023-08-26 NOTE — Telephone Encounter (Signed)
 Prescription was sent to the pharmacy

## 2023-08-28 ENCOUNTER — Telehealth: Payer: Self-pay

## 2023-08-28 MED ORDER — APIXABAN 5 MG PO TABS: 5.0000 mg | ORAL_TABLET | Freq: Two times a day (BID) | ORAL | 0 refills | Status: DC

## 2023-08-28 NOTE — Telephone Encounter (Signed)
 Copied from CRM [id number]. Topic: Clinical - Medication Question >> Aug 27, 2023  3:15 PM Bridgette Campus T wrote: Reason for CRM:apixaban  (ELIQUIS ) 5 MG TABS tablet - patient is asking if she is to keep taking this medication when this bottle is finished- please call [phone]-  needs refill if she is to continue- [facility] on [address]

## 2023-08-28 NOTE — Telephone Encounter (Signed)
 Per Dr. Anselmo Bast last note, she should continue the medication.

## 2023-08-28 NOTE — Telephone Encounter (Signed)
 Forwarding to providers team who is covering to assist. Only one month sent to pharmacy in March.

## 2023-08-30 ENCOUNTER — Other Ambulatory Visit: Payer: Self-pay | Admitting: Family Medicine

## 2023-08-30 DIAGNOSIS — M25552 Pain in left hip: Secondary | ICD-10-CM | POA: Diagnosis not present

## 2023-08-30 DIAGNOSIS — M170 Bilateral primary osteoarthritis of knee: Secondary | ICD-10-CM | POA: Diagnosis not present

## 2023-08-30 DIAGNOSIS — M7062 Trochanteric bursitis, left hip: Secondary | ICD-10-CM | POA: Diagnosis not present

## 2023-08-30 DIAGNOSIS — M25562 Pain in left knee: Secondary | ICD-10-CM | POA: Diagnosis not present

## 2023-08-30 NOTE — Telephone Encounter (Signed)
 Ok for E2C2 to review.  Please advise patient to continue the medication until she is told to stop. RX was sent to her pharmacy on file.

## 2023-09-02 NOTE — Telephone Encounter (Signed)
 Requested Prescriptions  Pending Prescriptions Disp Refills   gabapentin  (NEURONTIN ) 100 MG capsule [Pharmacy Med Name: GABAPENTIN  100MG  CAPSULES] 180 capsule 0    Sig: TAKE 2 CAPSULES(200 MG) BY MOUTH EVERY MORNING     Neurology: Anticonvulsants - gabapentin  Failed - 09/02/2023  3:07 PM      Failed - Valid encounter within last 12 months    Recent Outpatient Visits           3 weeks ago Recurrent falls   [facility], Megan P, DO   1 month ago Acute deep vein thrombosis (DVT) of other specified vein of left lower extremity (HCC)   [place] [facility], Megan P, DO              Passed - Cr in normal range and within 360 days    Creatinine, Ser  Date Value Ref Range Status  07/15/2023 0.64 0.57 - 1.00 mg/dL Final         Passed - Completed PHQ-2 or PHQ-9 in the last 360 days       gabapentin  (NEURONTIN ) 400 MG capsule [Pharmacy Med Name: GABAPENTIN  400MG  CAPSULES] 90 capsule 0    Sig: TAKE 1 CAPSULE(400 MG) BY MOUTH AT BEDTIME     Neurology: Anticonvulsants - gabapentin  Failed - 09/02/2023  3:07 PM      Failed - Valid encounter within last 12 months    Recent Outpatient Visits           3 weeks ago Recurrent falls   [place] [facility], Megan P, DO   1 month ago Acute deep vein thrombosis (DVT) of other specified vein of left lower extremity [facility])   [facility] [place], Megan P, DO              Passed - Cr in normal range and within 360 days    Creatinine, Ser  Date Value Ref Range Status  07/15/2023 0.64 0.57 - 1.00 mg/dL Final         Passed - Completed PHQ-2 or PHQ-9 in the last 360 days

## 2023-09-20 ENCOUNTER — Ambulatory Visit: Admitting: Family Medicine

## 2023-09-20 ENCOUNTER — Encounter: Payer: Self-pay | Admitting: Family Medicine

## 2023-09-20 VITALS — BP 156/86 | HR 82 | Ht 62.0 in | Wt 145.3 lb

## 2023-09-20 DIAGNOSIS — Z1231 Encounter for screening mammogram for malignant neoplasm of breast: Secondary | ICD-10-CM

## 2023-09-20 DIAGNOSIS — E1340 Other specified diabetes mellitus with diabetic neuropathy, unspecified: Secondary | ICD-10-CM | POA: Diagnosis not present

## 2023-09-20 DIAGNOSIS — E1140 Type 2 diabetes mellitus with diabetic neuropathy, unspecified: Secondary | ICD-10-CM

## 2023-09-20 DIAGNOSIS — I10 Essential (primary) hypertension: Secondary | ICD-10-CM | POA: Diagnosis not present

## 2023-09-20 DIAGNOSIS — E60 Dietary zinc deficiency: Secondary | ICD-10-CM

## 2023-09-20 DIAGNOSIS — E559 Vitamin D deficiency, unspecified: Secondary | ICD-10-CM

## 2023-09-20 DIAGNOSIS — E538 Deficiency of other specified B group vitamins: Secondary | ICD-10-CM

## 2023-09-20 DIAGNOSIS — I700 Atherosclerosis of aorta: Secondary | ICD-10-CM

## 2023-09-20 DIAGNOSIS — R300 Dysuria: Secondary | ICD-10-CM

## 2023-09-20 DIAGNOSIS — Z0000 Encounter for general adult medical examination without abnormal findings: Secondary | ICD-10-CM

## 2023-09-20 DIAGNOSIS — R220 Localized swelling, mass and lump, head: Secondary | ICD-10-CM

## 2023-09-20 DIAGNOSIS — I824Y2 Acute embolism and thrombosis of unspecified deep veins of left proximal lower extremity: Secondary | ICD-10-CM | POA: Diagnosis not present

## 2023-09-20 DIAGNOSIS — E519 Thiamine deficiency, unspecified: Secondary | ICD-10-CM

## 2023-09-20 DIAGNOSIS — E785 Hyperlipidemia, unspecified: Secondary | ICD-10-CM

## 2023-09-20 DIAGNOSIS — E610 Copper deficiency: Secondary | ICD-10-CM

## 2023-09-20 LAB — MICROSCOPIC EXAMINATION
Bacteria, UA: NONE SEEN
RBC, Urine: NONE SEEN /HPF (ref 0–2)

## 2023-09-20 LAB — URINALYSIS, ROUTINE W REFLEX MICROSCOPIC
Bilirubin, UA: NEGATIVE
Glucose, UA: NEGATIVE
Nitrite, UA: NEGATIVE
RBC, UA: NEGATIVE
Specific Gravity, UA: 1.025 (ref 1.005–1.030)
Urobilinogen, Ur: 1 mg/dL (ref 0.2–1.0)
pH, UA: 7 (ref 5.0–7.5)

## 2023-09-20 LAB — MICROALBUMIN, URINE WAIVED
Creatinine, Urine Waived: 200 mg/dL (ref 10–300)
Microalb, Ur Waived: 80 mg/L — ABNORMAL HIGH (ref 0–19)
Microalb/Creat Ratio: <30 mg/g (ref <30)

## 2023-09-20 LAB — BAYER DCA HB A1C WAIVED: HB A1C (BAYER DCA - WAIVED): 6.1 % — ABNORMAL HIGH (ref 4.8–5.6)

## 2023-09-20 MED ORDER — GABAPENTIN 100 MG PO CAPS: 200.0000 mg | ORAL_CAPSULE | Freq: Every morning | ORAL | 0 refills | Status: DC

## 2023-09-20 MED ORDER — GABAPENTIN 400 MG PO CAPS: 400.0000 mg | ORAL_CAPSULE | Freq: Every day | ORAL | 0 refills | Status: DC

## 2023-09-20 MED ORDER — METFORMIN HCL 500 MG PO TABS
500.0000 mg | ORAL_TABLET | Freq: Every day | ORAL | 0 refills | Status: DC
Start: 2023-09-20 — End: 2023-12-19

## 2023-09-20 MED ORDER — APIXABAN 5 MG PO TABS: 5.0000 mg | ORAL_TABLET | Freq: Two times a day (BID) | ORAL | 0 refills | Status: DC

## 2023-09-20 NOTE — Progress Notes (Signed)
 BP (!) 156/86   Pulse 82   Ht 5\' 2"  (1.575 m)   Wt 145 lb 4.8 oz (65.9 kg)   SpO2 99%   BMI 26.58 kg/m    Subjective:    Patient ID: Lisa Galvan, female    DOB: March 04, 1961, 63 y.o.   MRN: [id number]  HPI: Lisa Galvan is a 63 y.o. female presenting on 09/20/2023 for comprehensive medical examination. Current medical complaints include:  Stopped her PT due to cost. Has been walking more. Has not had any falls since last time she was here.  RASH Duration:  4 month  Location: chest and stomach  Itching: yes Burning: no Redness: no Oozing: no Scaling: no Blisters: no Painful: no Fevers: no Change in detergents/soaps/personal care products: no Recent illness: yes Recent travel:no History of same: yes Context: stable Alleviating factors: lotion, triamcinalone, ketoconazole , hydroxyzine  Treatments attempted:ketoconazole  Shortness of breath: no  Throat/tongue swelling: no Myalgias/arthralgias: no  DIABETES Hypoglycemic episodes:no Polydipsia/polyuria: no Visual disturbance: no Chest pain: no Paresthesias: no Glucose Monitoring: no  Accucheck frequency: Not Checking Taking Insulin ?: no Blood Pressure Monitoring: not checking Retinal Examination: Up to Date Foot Exam: Up to Date Diabetic Education: Completed Pneumovax: Up to Date Influenza: Up to Date Aspirin : no  HYPERTENSION / HYPERLIPIDEMIA Satisfied with current treatment? yes Duration of hypertension: chronic BP monitoring frequency: not checking BP medication side effects: no Duration of hyperlipidemia: chronic Cholesterol medication side effects: no Cholesterol supplements: none Past cholesterol medications: atorvastatin  Medication compliance: excellent compliance Aspirin : no Recent stressors: no Recurrent headaches: no Visual changes: no Palpitations: no Dyspnea: no Chest pain: no Lower extremity edema: no Dizzy/lightheaded: no  Menopausal Symptoms: no  Depression Screen done today and  results listed below:     08/12/2023    1:34 PM 07/15/2023    2:52 PM 05/20/2023   11:11 AM 05/03/2023   12:12 PM 02/12/2023    1:35 PM  Depression screen PHQ 2/9  Decreased Interest 0  0 0 0  Down, Depressed, Hopeless 0  0 0 0  PHQ - 2 Score 0  0 0 0  Altered sleeping 0 0 0 0 0  Tired, decreased energy 0 0 0 0 0  Change in appetite 0 0 0 0 2  Feeling bad or failure about yourself  0 0 0 0 0  Trouble concentrating 0 0 0 0 0  Moving slowly or fidgety/restless 0 0 0 0 0  Suicidal thoughts 0 0 0 0 0  PHQ-9 Score 0  0 0 2  Difficult doing work/chores   Not difficult at all Not difficult at all Not difficult at all    Past Medical History:  Past Medical History:  Diagnosis Date   Allergy    Anemia    Arthritis    spine   Bilateral leg edema    Chest pain 12/15/2014   Chronic sciatica 12/10/2014   COVID-19    2023   DDD (degenerative disc disease), lumbar    Essential hypertension 12/10/2014   Gastroesophageal reflux disease without esophagitis    Mixed hyperlipidemia    Neuropathy    Obesity (BMI 30.0-34.9) 12/10/2014   Pneumonia 1968   Sleep apnea    new cpap   Type 2 diabetes mellitus without complication (HCC) 12/10/2014   Umbilical hernia     Surgical History:  Past Surgical History:  Procedure Laterality Date   ABDOMINAL HYSTERECTOMY     ANKLE CLOSED REDUCTION Right 04/15/2022   Procedure: CLOSED REDUCTION ANKLE;  Surgeon: Michalene Agee,  Olive Better, DPM;  Location: [facility];  Service: Podiatry;  Laterality: Right;   CARPAL TUNNEL RELEASE Right 11/07/2022   Procedure: CARPAL TUNNEL RELEASE ENDOSCOPIC;  Surgeon: Elner Hahn, MD;  Location: [facility];  Service: Orthopedics;  Laterality: Right;  3rd case   CARPAL TUNNEL RELEASE Left 02/07/2023   Procedure: CARPAL TUNNEL RELEASE ENDOSCOPIC;  Surgeon: Elner Hahn, MD;  Location: [facility];  Service: Orthopedics;  Laterality: Left;  3rd case   COLONOSCOPY WITH PROPOFOL  N/A 03/30/2019   Procedure: COLONOSCOPY WITH PROPOFOL ;   Surgeon: Selena Daily, MD;  Location: [facility];  Service: Gastroenterology;  Laterality: N/A;   ESOPHAGOGASTRODUODENOSCOPY (EGD) WITH PROPOFOL  N/A 03/12/2017   Procedure: ESOPHAGOGASTRODUODENOSCOPY (EGD) WITH PROPOFOL ;  Surgeon: Selena Daily, MD;  Location: [facility];  Service: Gastroenterology;  Laterality: N/A;   ESOPHAGOGASTRODUODENOSCOPY (EGD) WITH PROPOFOL  N/A 03/30/2019   Procedure: ESOPHAGOGASTRODUODENOSCOPY (EGD) WITH PROPOFOL ;  Surgeon: Selena Daily, MD;  Location: [facility];  Service: Gastroenterology;  Laterality: N/A;   HERNIA REPAIR     INCISIONAL HERNIA REPAIR N/A 11/29/2020   Procedure: HERNIA REPAIR INCISIONAL, open;  Surgeon: Alben Alma, MD;  Location: [facility];  Service: General;  Laterality: N/A;   ORIF ANKLE FRACTURE Left 07/30/2019   Procedure: OPEN REDUCTION INTERNAL FIXATION (ORIF) OF LEFT BIMALLEOLAR ANKLE FRACTURE;  Surgeon: Lorri Rota, MD;  Location: [facility];  Service: Orthopedics;  Laterality: Left;  Diabetic - oral meds   ORIF ANKLE FRACTURE Right 05/03/2022   Procedure: OPEN REDUCTION INTERNAL FIXATION (ORIF) ANKLE FRACTURE - TRIMALLEOLAR WITH INTERNAL FIXATION;  Surgeon: Pink Bridges, DPM;  Location: [facility];  Service: Podiatry;  Laterality: Right;   SUPRACERVICAL ABDOMINAL HYSTERECTOMY     UMBILICAL HERNIA REPAIR N/A 05/01/2017   Procedure: HERNIA REPAIR UMBILICAL ADULT;  Surgeon: Franki Isles, MD;  Location: [facility];  Service: General;  Laterality: N/A;   XI ROBOTIC ASSISTED VENTRAL HERNIA N/A 10/06/2019   Procedure: XI ROBOTIC ASSISTED VENTRAL HERNIA;  Surgeon: Alben Alma, MD;  Location: [facility];  Service: General;  Laterality: N/A;    Medications:  Current Outpatient Medications on File Prior to Visit  Medication Sig   Accu-Chek Softclix Lancets lancets TEST TWICE DAILY   atorvastatin  (LIPITOR) 20 MG tablet Take 1 tablet (20 mg total) by mouth daily. TAKE 1 TABLET(20 MG) BY MOUTH DAILY   B  Complex-C-Folic Acid  (B COMPLEX-VITAMIN C-FOLIC ACID ) 1 MG tablet Take 1 tablet by mouth daily with breakfast.   Cholecalciferol  (VITAMIN D -3) 5000 units TABS Take 5,000 Units by mouth daily.   Cholecalciferol  (VITAMIN D3) 125 MCG (5000 UT) CAPS Take 1 capsule (5,000 Units total) by mouth daily.   clotrimazole -betamethasone  (LOTRISONE ) cream Apply 1 Application topically daily.   diclofenac  Sodium (VOLTAREN ) 1 % GEL Apply 4 g topically 4 (four) times daily.   DULoxetine  (CYMBALTA ) 60 MG capsule Take 1 capsule (60 mg total) by mouth daily.   EPINEPHrine  0.3 mg/0.3 mL IJ SOAJ injection Inject 0.3 mg into the muscle as needed for anaphylaxis.   folic acid  (FOLVITE ) 1 MG tablet Take 1 tablet (1 mg total) by mouth daily.   ketoconazole  (NIZORAL ) 2 % cream Apply 1 Application topically daily.   Magnesium  Oxide -Mg Supplement 500 MG TABS Take 1 tablet (500 mg total) by mouth daily.   omeprazole  (PRILOSEC) 40 MG capsule TAKE 1 CAPSULE(40 MG) BY MOUTH BID   polyethylene glycol (MIRALAX / GLYCOLAX) 17 g packet Take by mouth.   thiamine  (VITAMIN B1) 100 MG tablet  Take 1 tablet (100 mg total) by mouth daily.   traZODone  (DESYREL ) 50 MG tablet Take 1 tablet (50 mg total) by mouth at bedtime as needed for sleep.   vitamin B-12 (CYANOCOBALAMIN ) 1000 MCG tablet Take 1,000 mcg by mouth 2 (two) times daily.   senna (SENOKOT) 8.6 MG tablet Take 2 tablets by mouth at bedtime. (Patient not taking: Reported on 09/20/2023)   No current facility-administered medications on file prior to visit.    Allergies:  Allergies  Allergen Reactions   Benazepril Swelling    Face and mouth swelling.   Ivp Dye [Iodinated Contrast Media] Anaphylaxis    Patient unsure of reaction but in case of reaction d/t shellfish allergy   Lidoderm  [Lidocaine ] Rash    02-01-2021: Patient had lidocaine  patch applied to chest at sight of injury and she experienced a rash, whelping, and swelling. Resolved after removing the patch. Likely to be  the adhesive as she can tolerate injectable lidocaine  w/o problems.   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 Her daughter will provide her transportation  and will stay with patient after surgery.   Feels safe in her home.   Minimal stairs in her home.   No lifting at work.   Social Drivers of Health   Financial Resource Strain: Medium Risk (08/30/2023)   Received from [facility]   Overall Financial Resource Strain (CARDIA)    Difficulty of Paying Living Expenses: Somewhat hard  Food Insecurity: Food Insecurity Present (08/30/2023)   Received from [facility]   Hunger Vital Sign    Worried About Running Out of Food in the Last Year: Sometimes true    Ran Out of Food in the Last Year: Sometimes true  Transportation Needs: Unmet Transportation Needs (08/30/2023)   Received from [facility] - Transportation    In the past 12 months, has lack of transportation kept you from medical appointments or from getting  medications?: Yes    Lack of Transportation (Non-Medical): Yes  Physical Activity: Sufficiently Active (07/15/2023)   Exercise Vital Sign    Days of Exercise per Week: 7 days    Minutes of Exercise per Session: 40 min  Stress: No Stress Concern Present (07/15/2023)   [employer] of Occupational Health - Occupational Stress Questionnaire    Feeling of Stress : Only a little  Social Connections: Moderately Isolated (07/15/2023)   Social Connection  and Isolation Panel [NHANES]    Frequency of Communication with Friends and Family: More than three times a week    Frequency of Social Gatherings with Friends and Family: More than three times a week    Attends Religious Services: More than 4 times per year    Active Member of [employer] or Organizations: No    Attends Banker Meetings: Not on file    Marital Status: Widowed  Intimate Partner Violence: Not At Risk (08/05/2023)   Humiliation, Afraid, Rape, and Kick questionnaire    Fear of Current or Ex-Partner: No    Emotionally Abused: No    Physically Abused: No    Sexually Abused: No   Social History   Tobacco Use  Smoking Status Never  Smokeless Tobacco Never   Social History   Substance and Sexual Activity  Alcohol Use Yes   Alcohol/week: 5.0 standard drinks of alcohol   Types: 5 Glasses of wine per week    Family History:  Family History  Problem Relation Age of Onset   Diabetes Mother    Cancer Mother        breast and stomach   Breast cancer Mother 13   Dementia Mother    Heart disease Father    Kidney disease Father        dialysis   Cancer Father        colon   Diabetes Father    Hypertension Sister    Hypertension Brother    Hypertension Daughter    Congestive Heart Failure Maternal Grandmother    Diabetes Maternal Grandmother    Hypertension Maternal Grandmother     Past medical history, surgical history, medications, allergies, family history and social history reviewed with patient today and  changes made to appropriate areas of the chart.   Review of Systems  Constitutional: Negative.   HENT:  Positive for ear pain. Negative for congestion, ear discharge, hearing loss, nosebleeds, sinus pain, sore throat and tinnitus.   Eyes: Negative.   Respiratory: Negative.  Negative for stridor.   Cardiovascular: Negative.   Gastrointestinal:  Negative for abdominal pain, blood in stool, constipation, diarrhea, heartburn, melena, nausea and vomiting.       Fecal incontinence, fecal urgency  Genitourinary:  Positive for frequency and urgency. Negative for dysuria, flank pain and hematuria.  Musculoskeletal:  Positive for joint pain. Negative for back pain, falls, myalgias and neck pain.  Skin: Negative.   Neurological:  Positive for tingling and weakness. Negative for dizziness, tremors, sensory change, speech change, focal weakness, seizures, loss of consciousness and headaches.  Endo/Heme/Allergies:  Negative for environmental allergies and polydipsia. Bruises/bleeds easily.  Psychiatric/Behavioral: Negative.     All other ROS negative except what is listed above and in the HPI.      Objective:     BP (!) 156/86   Pulse 82   Ht 5\' 2"  (1.575 m)   Wt 145 lb 4.8 oz (65.9 kg)   SpO2 99%   BMI 26.58 kg/m   Wt Readings from Last 3 Encounters:  09/20/23 145 lb 4.8 oz (65.9 kg)  08/12/23 142 lb 9.6 oz (64.7 kg)  07/15/23 145 lb (65.8 kg)    Physical Exam Vitals and nursing note reviewed.  Constitutional:      General: She is not in acute distress.    Appearance: Normal appearance. She is not ill-appearing, toxic-appearing or diaphoretic.  HENT:     Head: Normocephalic and atraumatic.     Right Ear: Tympanic  membrane, ear canal and external ear normal. There is no impacted cerumen.     Left Ear: Tympanic membrane, ear canal and external ear normal. There is no impacted cerumen.     Nose: Nose normal. No congestion or rhinorrhea.     Mouth/Throat:     Mouth: Mucous membranes are  moist.     Pharynx: Oropharynx is clear. No oropharyngeal exudate or posterior oropharyngeal erythema.  Eyes:     General: No scleral icterus.       Right eye: No discharge.        Left eye: No discharge.     Extraocular Movements: Extraocular movements intact.     Conjunctiva/sclera: Conjunctivae normal.     Pupils: Pupils are equal, round, and reactive to light.  Neck:     Vascular: No carotid bruit.  Cardiovascular:     Rate and Rhythm: Normal rate and regular rhythm.     Pulses: Normal pulses.     Heart sounds: No murmur heard.    No friction rub. No gallop.  Pulmonary:     Effort: Pulmonary effort is normal. No respiratory distress.     Breath sounds: Normal breath sounds. No stridor. No wheezing, rhonchi or rales.  Chest:     Chest wall: No tenderness.  Abdominal:     General: Abdomen is flat. Bowel sounds are normal. There is no distension.     Palpations: Abdomen is soft. There is no mass.     Tenderness: There is no abdominal tenderness. There is no right CVA tenderness, left CVA tenderness, guarding or rebound.     Hernia: No hernia is present.  Genitourinary:    Comments: Breast and pelvic exams deferred with shared decision making Musculoskeletal:        General: No swelling, tenderness, deformity or signs of injury.     Cervical back: Normal range of motion and neck supple. No rigidity. No muscular tenderness.     Right lower leg: No edema.     Left lower leg: No edema.  Lymphadenopathy:     Cervical: No cervical adenopathy.  Skin:    General: Skin is warm and dry.     Capillary Refill: Capillary refill takes less than 2 seconds.     Coloration: Skin is not jaundiced or pale.     Findings: No bruising, erythema, lesion or rash.  Neurological:     General: No focal deficit present.     Mental Status: She is alert and oriented to person, place, and time. Mental status is at baseline.     Cranial Nerves: No cranial nerve deficit.     Sensory: No sensory deficit.      Motor: No weakness.     Coordination: Coordination normal.     Gait: Gait normal.     Deep Tendon Reflexes: Reflexes normal.  Psychiatric:        Mood and Affect: Mood normal.        Behavior: Behavior normal.        Thought Content: Thought content normal.        Judgment: Judgment normal.     Results for orders placed or performed in visit on 09/20/23  Bayer DCA Hb A1c Waived   Collection Time: 09/20/23  2:35 PM  Result Value Ref Range   HB A1C (BAYER DCA - WAIVED) 6.1 (H) 4.8 - 5.6 %  Microalbumin, Urine Waived   Collection Time: 09/20/23  2:35 PM  Result Value Ref Range   Microalb, Ur  Waived 80 (H) 0 - 19 mg/L   Creatinine, Urine Waived 200 10 - 300 mg/dL   Microalb/Creat Ratio <30 <30 mg/g  CBC with Differential/Platelet   Collection Time: 09/20/23  2:39 PM  Result Value Ref Range   WBC 7.5 3.4 - 10.8 x10E3/uL   RBC 4.34 3.77 - 5.28 x10E6/uL   Hemoglobin 12.1 11.1 - 15.9 g/dL   Hematocrit 40.9 81.1 - 46.6 %   MCV 91 79 - 97 fL   MCH 27.9 26.6 - 33.0 pg   MCHC 30.7 (L) 31.5 - 35.7 g/dL   RDW 91.4 78.2 - 95.6 %   Platelets 228 150 - 450 x10E3/uL   Neutrophils 52 Not Estab. %   Lymphs 41 Not Estab. %   Monocytes 6 Not Estab. %   Eos 1 Not Estab. %   Basos 0 Not Estab. %   Neutrophils Absolute 3.9 1.4 - 7.0 x10E3/uL   Lymphocytes Absolute 3.1 0.7 - 3.1 x10E3/uL   Monocytes Absolute 0.4 0.1 - 0.9 x10E3/uL   EOS (ABSOLUTE) 0.1 0.0 - 0.4 x10E3/uL   Basophils Absolute 0.0 0.0 - 0.2 x10E3/uL   Immature Granulocytes 0 Not Estab. %   Immature Grans (Abs) 0.0 0.0 - 0.1 x10E3/uL  Comprehensive metabolic panel with GFR   Collection Time: 09/20/23  2:39 PM  Result Value Ref Range   Glucose 78 70 - 99 mg/dL   BUN 15 8 - 27 mg/dL   Creatinine, Ser 2.13 0.57 - 1.00 mg/dL   eGFR 86 >08 MV/HQI/6.96   BUN/Creatinine Ratio 19 12 - 28   Sodium 140 134 - 144 mmol/L   Potassium 4.0 3.5 - 5.2 mmol/L   Chloride 102 96 - 106 mmol/L   CO2 24 20 - 29 mmol/L   Calcium  10.2 8.7 -  10.3 mg/dL   Total Protein 6.5 6.0 - 8.5 g/dL   Albumin 4.4 3.9 - 4.9 g/dL   Globulin, Total 2.1 1.5 - 4.5 g/dL   Bilirubin Total 0.3 0.0 - 1.2 mg/dL   Alkaline Phosphatase 95 44 - 121 IU/L   AST 16 0 - 40 IU/L   ALT 14 0 - 32 IU/L  Lipid Panel w/o Chol/HDL Ratio   Collection Time: 09/20/23  2:39 PM  Result Value Ref Range   Cholesterol, Total 164 100 - 199 mg/dL   Triglycerides 73 0 - 149 mg/dL   HDL 93 >29 mg/dL   VLDL Cholesterol Cal 14 5 - 40 mg/dL   LDL Chol Calc (NIH) 57 0 - 99 mg/dL  TSH   Collection Time: 09/20/23  2:39 PM  Result Value Ref Range   TSH 1.480 0.450 - 4.500 uIU/mL  B12   Collection Time: 09/20/23  2:39 PM  Result Value Ref Range   Vitamin B-12 533 232 - 1,245 pg/mL  Folate   Collection Time: 09/20/23  2:39 PM  Result Value Ref Range   Folate >20.0 >3.0 ng/mL  Magnesium    Collection Time: 09/20/23  2:39 PM  Result Value Ref Range   Magnesium  1.4 (L) 1.6 - 2.3 mg/dL  VITAMIN D  25 Hydroxy (Vit-D Deficiency, Fractures)   Collection Time: 09/20/23  2:39 PM  Result Value Ref Range   Vit D, 25-Hydroxy 70.9 30.0 - 100.0 ng/mL  Vitamin B1   Collection Time: 09/20/23  2:39 PM  Result Value Ref Range   Thiamine  WILL FOLLOW   Zinc    Collection Time: 09/20/23  2:39 PM  Result Value Ref Range   Zinc  75 44 -  115 ug/dL  Copper , serum   Collection Time: 09/20/23  2:39 PM  Result Value Ref Range   Copper  77 (L) 80 - 158 ug/dL  Microscopic Examination   Collection Time: 09/20/23  3:12 PM   Urine  Result Value Ref Range   WBC, UA 0-5 0 - 5 /hpf   RBC, Urine None seen 0 - 2 /hpf   Epithelial Cells (non renal) 0-10 0 - 10 /hpf   Crystals Present (A) N/A   Crystal Type Calcium  Oxalate N/A   Bacteria, UA None seen None seen/Few  Urinalysis, Routine w reflex microscopic   Collection Time: 09/20/23  3:12 PM  Result Value Ref Range   Specific Gravity, UA 1.025 1.005 - 1.030   pH, UA 7.0 5.0 - 7.5   Color, UA Yellow Yellow   Appearance Ur Clear Clear    Leukocytes,UA Trace (A) Negative   Protein,UA Trace (A) Negative/Trace   Glucose, UA Negative Negative   Ketones, UA 1+ (A) Negative   RBC, UA Negative Negative   Bilirubin, UA Negative Negative   Urobilinogen, Ur 1.0 0.2 - 1.0 mg/dL   Nitrite, UA Negative Negative   Microscopic Examination See below:       Assessment & Plan:   Problem List Items Addressed This Visit       Cardiovascular and Mediastinum   Essential hypertension   Running high. Will work on diet and exercise and monitor at home. Continue to monitor. Call with any concerns.       Relevant Medications   apixaban  (ELIQUIS ) 5 MG TABS tablet   Other Relevant Orders   CBC with Differential/Platelet (Completed)   Comprehensive metabolic panel with GFR (Completed)   Microalbumin, Urine Waived (Completed)   Aortic atherosclerosis (HCC)   Will keep her BP and cholesterol under good control. Continue to monitor. Call with any concerns.       Relevant Medications   apixaban  (ELIQUIS ) 5 MG TABS tablet   Other Relevant Orders   CBC with Differential/Platelet (Completed)   Comprehensive metabolic panel with GFR (Completed)   Deep venous thrombosis (HCC)   Referral to vascular placed. Has been on eliquis  for 3 months- provoked event, will need to be on it for 6 months then we can stop.       Relevant Medications   apixaban  (ELIQUIS ) 5 MG TABS tablet   Other Relevant Orders   Ambulatory referral to Vascular Surgery     Endocrine   Type 2 diabetes mellitus with diabetic neuropathy, unspecified (HCC)   Stable with A1c of 6.1. Continue current regimen. Continue to monitor. Call with any concerns.       Relevant Medications   metFORMIN  (GLUCOPHAGE ) 500 MG tablet   Other Relevant Orders   CBC with Differential/Platelet (Completed)   Comprehensive metabolic panel with GFR (Completed)   Bayer DCA Hb A1c Waived (Completed)   Microalbumin, Urine Waived (Completed)   Neuropathy due to secondary diabetes (HCC)    Improved. Mental status significantly better on lower dose of gabapentin , but may need so slightly increase depending on pain. Continue to monitor.       Relevant Medications   metFORMIN  (GLUCOPHAGE ) 500 MG tablet   Other Relevant Orders   CBC with Differential/Platelet (Completed)   Comprehensive metabolic panel with GFR (Completed)   TSH (Completed)     Other   Hyperlipidemia   Under good control on current regimen. Continue current regimen. Continue to monitor. Call with any concerns. Refills given. Labs drawn today.  Relevant Medications   apixaban  (ELIQUIS ) 5 MG TABS tablet   Other Relevant Orders   CBC with Differential/Platelet (Completed)   Comprehensive metabolic panel with GFR (Completed)   Lipid Panel w/o Chol/HDL Ratio (Completed)   Vitamin D  deficiency   Rechecking labs oday. Await results. Treat as needed.       Relevant Orders   CBC with Differential/Platelet (Completed)   Comprehensive metabolic panel with GFR (Completed)   VITAMIN D  25 Hydroxy (Vit-D Deficiency, Fractures) (Completed)   Hypomagnesemia   Rechecking labs oday. Await results. Treat as needed.       Relevant Orders   CBC with Differential/Platelet (Completed)   Comprehensive metabolic panel with GFR (Completed)   Magnesium  (Completed)   Facial swelling   Referral to ENT placed today. Await their input.       Relevant Orders   Ambulatory referral to ENT   Folate deficiency   Rechecking labs oday. Await results. Treat as needed.       Relevant Orders   CBC with Differential/Platelet (Completed)   Comprehensive metabolic panel with GFR (Completed)   Folate (Completed)   Copper  deficiency   Rechecking labs oday. Await results. Treat as needed.       Relevant Orders   CBC with Differential/Platelet (Completed)   Comprehensive metabolic panel with GFR (Completed)   Copper , Serum   Zinc  deficiency   Rechecking labs oday. Await results. Treat as needed.       Relevant Orders    CBC with Differential/Platelet (Completed)   Comprehensive metabolic panel with GFR (Completed)   Zinc  (Completed)   Vitamin B1 deficiency   Rechecking labs oday. Await results. Treat as needed.       Relevant Orders   CBC with Differential/Platelet (Completed)   Comprehensive metabolic panel with GFR (Completed)   Vitamin B1 (Completed)   B12 deficiency   Rechecking labs oday. Await results. Treat as needed.       Relevant Orders   CBC with Differential/Platelet (Completed)   Comprehensive metabolic panel with GFR (Completed)   B12 (Completed)   Other Visit Diagnoses       Routine general medical examination at a health care facility    -  Primary   Vaccines up to date. Screening labs checked today. Pap up to date. Mammogram orderd. Colonoscopy up to date. Continue diet and exercise. Call with concerns.     Dysuria       Checking urine today. Await results. Treat as needed.   Relevant Orders   Urinalysis, Routine w reflex microscopic (Completed)   Urine Culture   Urine Culture     Encounter for screening mammogram for malignant neoplasm of breast       Mammogram ordered today.   Relevant Orders   MM 3D SCREENING MAMMOGRAM BILATERAL BREAST        Follow up plan: Return in about 3 months (around 12/21/2023).   LABORATORY TESTING:  - Pap smear: up to date  IMMUNIZATIONS:   - Tdap: Tetanus vaccination status reviewed: last tetanus booster within 10 years. - Influenza: Up to date - Pneumovax: Up to date - Prevnar: Refused - COVID: Up to date - HPV: Not applicable - Shingrix vaccine: Up to date  SCREENING: -Mammogram: Ordered today  - Colonoscopy: Up to date    PATIENT COUNSELING:   Advised to take 1 mg of folate supplement per day if capable of pregnancy.   Sexuality: Discussed sexually transmitted diseases, partner selection, use of condoms, avoidance of unintended pregnancy  and contraceptive alternatives.   Advised to avoid cigarette smoking.  I  discussed with the patient that most people either abstain from alcohol or drink within safe limits (<=14/week and <=4 drinks/occasion for males, <=7/weeks and <= 3 drinks/occasion for females) and that the risk for alcohol disorders and other health effects rises proportionally with the number of drinks per week and how often a drinker exceeds daily limits.  Discussed cessation/primary prevention of drug use and availability of treatment for abuse.   Diet: Encouraged to adjust caloric intake to maintain  or achieve ideal body weight, to reduce intake of dietary saturated fat and total fat, to limit sodium intake by avoiding high sodium foods and not adding table salt, and to maintain adequate dietary potassium and calcium  preferably from fresh fruits, vegetables, and low-fat dairy products.    stressed the importance of regular exercise  Injury prevention: Discussed safety belts, safety helmets, smoke detector, smoking near bedding or upholstery.   Dental health: Discussed importance of regular tooth brushing, flossing, and dental visits.    NEXT PREVENTATIVE PHYSICAL DUE IN 1 YEAR. Return in about 3 months (around 12/21/2023).

## 2023-09-21 DIAGNOSIS — Z59819 Housing instability, housed unspecified: Secondary | ICD-10-CM | POA: Diagnosis not present

## 2023-09-21 DIAGNOSIS — M7989 Other specified soft tissue disorders: Secondary | ICD-10-CM | POA: Diagnosis not present

## 2023-09-21 DIAGNOSIS — Z86718 Personal history of other venous thrombosis and embolism: Secondary | ICD-10-CM | POA: Diagnosis not present

## 2023-09-21 DIAGNOSIS — E119 Type 2 diabetes mellitus without complications: Secondary | ICD-10-CM | POA: Diagnosis not present

## 2023-09-21 DIAGNOSIS — Z7984 Long term (current) use of oral hypoglycemic drugs: Secondary | ICD-10-CM | POA: Diagnosis not present

## 2023-09-21 DIAGNOSIS — Z5941 Food insecurity: Secondary | ICD-10-CM | POA: Diagnosis not present

## 2023-09-21 DIAGNOSIS — Z79899 Other long term (current) drug therapy: Secondary | ICD-10-CM | POA: Diagnosis not present

## 2023-09-21 DIAGNOSIS — Z7901 Long term (current) use of anticoagulants: Secondary | ICD-10-CM | POA: Diagnosis not present

## 2023-09-21 DIAGNOSIS — E1140 Type 2 diabetes mellitus with diabetic neuropathy, unspecified: Secondary | ICD-10-CM | POA: Diagnosis not present

## 2023-09-21 DIAGNOSIS — R600 Localized edema: Secondary | ICD-10-CM | POA: Diagnosis not present

## 2023-09-21 DIAGNOSIS — I10 Essential (primary) hypertension: Secondary | ICD-10-CM | POA: Diagnosis not present

## 2023-09-23 DIAGNOSIS — R300 Dysuria: Secondary | ICD-10-CM | POA: Diagnosis not present

## 2023-09-24 DIAGNOSIS — E538 Deficiency of other specified B group vitamins: Secondary | ICD-10-CM | POA: Insufficient documentation

## 2023-09-24 NOTE — Assessment & Plan Note (Signed)
Will keep her BP and cholesterol under good control. Continue to monitor. Call with any concerns.  

## 2023-09-24 NOTE — Assessment & Plan Note (Signed)
 Rechecking labs oday. Await results. Treat as needed.

## 2023-09-24 NOTE — Assessment & Plan Note (Signed)
 Running high. Will work on diet and exercise and monitor at home. Continue to monitor. Call with any concerns.

## 2023-09-24 NOTE — Assessment & Plan Note (Addendum)
 Stable with A1c of 6.1. Continue current regimen. Continue to monitor. Call with any concerns.   Improved. Mental status significantly better on lower dose of gabapentin , but may need so slightly increase depending on pain. Continue to monitor.

## 2023-09-24 NOTE — Assessment & Plan Note (Signed)
 Referral to vascular placed. Has been on eliquis  for 3 months- provoked event, will need to be on it for 6 months then we can stop.

## 2023-09-24 NOTE — Assessment & Plan Note (Signed)
 Referral to ENT placed today. Await their input.

## 2023-09-24 NOTE — Assessment & Plan Note (Signed)
 Under good control on current regimen. Continue current regimen. Continue to monitor. Call with any concerns. Refills given. Labs drawn today.

## 2023-09-24 NOTE — Assessment & Plan Note (Signed)
 Improved. Mental status significantly better on lower dose of gabapentin , but may need so slightly increase depending on pain. Continue to monitor.

## 2023-09-25 DIAGNOSIS — H9202 Otalgia, left ear: Secondary | ICD-10-CM | POA: Diagnosis not present

## 2023-09-25 DIAGNOSIS — R070 Pain in throat: Secondary | ICD-10-CM | POA: Diagnosis not present

## 2023-09-25 DIAGNOSIS — M26649 Arthritis of unspecified temporomandibular joint: Secondary | ICD-10-CM | POA: Diagnosis not present

## 2023-09-25 LAB — URINE CULTURE

## 2023-09-27 ENCOUNTER — Telehealth: Payer: Self-pay

## 2023-09-27 DIAGNOSIS — Z86718 Personal history of other venous thrombosis and embolism: Secondary | ICD-10-CM | POA: Diagnosis not present

## 2023-09-27 DIAGNOSIS — M7989 Other specified soft tissue disorders: Secondary | ICD-10-CM | POA: Diagnosis not present

## 2023-09-27 LAB — COMPREHENSIVE METABOLIC PANEL WITH GFR
ALT: 14 IU/L (ref 0–32)
AST: 16 IU/L (ref 0–40)
Albumin: 4.4 g/dL (ref 3.9–4.9)
Alkaline Phosphatase: 95 IU/L (ref 44–121)
BUN/Creatinine Ratio: 19 (ref 12–28)
BUN: 15 mg/dL (ref 8–27)
Bilirubin Total: 0.3 mg/dL (ref 0.0–1.2)
CO2: 24 mmol/L (ref 20–29)
Calcium: 10.2 mg/dL (ref 8.7–10.3)
Chloride: 102 mmol/L (ref 96–106)
Creatinine, Ser: 0.77 mg/dL (ref 0.57–1.00)
Globulin, Total: 2.1 g/dL (ref 1.5–4.5)
Glucose: 78 mg/dL (ref 70–99)
Potassium: 4 mmol/L (ref 3.5–5.2)
Sodium: 140 mmol/L (ref 134–144)
Total Protein: 6.5 g/dL (ref 6.0–8.5)
eGFR: 86 mL/min/{1.73_m2} (ref >59)

## 2023-09-27 LAB — CBC WITH DIFFERENTIAL/PLATELET
Basophils Absolute: 0 10*3/uL (ref 0.0–0.2)
Basos: 0 %
EOS (ABSOLUTE): 0.1 10*3/uL (ref 0.0–0.4)
Eos: 1 %
Hematocrit: 39.4 % (ref 34.0–46.6)
Hemoglobin: 12.1 g/dL (ref 11.1–15.9)
Immature Grans (Abs): 0 10*3/uL (ref 0.0–0.1)
Immature Granulocytes: 0 %
Lymphocytes Absolute: 3.1 10*3/uL (ref 0.7–3.1)
Lymphs: 41 %
MCH: 27.9 pg (ref 26.6–33.0)
MCHC: 30.7 g/dL — ABNORMAL LOW (ref 31.5–35.7)
MCV: 91 fL (ref 79–97)
Monocytes Absolute: 0.4 10*3/uL (ref 0.1–0.9)
Monocytes: 6 %
Neutrophils Absolute: 3.9 10*3/uL (ref 1.4–7.0)
Neutrophils: 52 %
Platelets: 228 10*3/uL (ref 150–450)
RBC: 4.34 x10E6/uL (ref 3.77–5.28)
RDW: 13.1 % (ref 11.7–15.4)
WBC: 7.5 10*3/uL (ref 3.4–10.8)

## 2023-09-27 LAB — VITAMIN D 25 HYDROXY (VIT D DEFICIENCY, FRACTURES): Vit D, 25-Hydroxy: 70.9 ng/mL (ref 30.0–100.0)

## 2023-09-27 LAB — LIPID PANEL W/O CHOL/HDL RATIO
Cholesterol, Total: 164 mg/dL (ref 100–199)
HDL: 93 mg/dL (ref >39)
LDL Chol Calc (NIH): 57 mg/dL (ref 0–99)
Triglycerides: 73 mg/dL (ref 0–149)
VLDL Cholesterol Cal: 14 mg/dL (ref 5–40)

## 2023-09-27 LAB — ZINC: Zinc: 75 ug/dL (ref 44–115)

## 2023-09-27 LAB — MAGNESIUM: Magnesium: 1.4 mg/dL — ABNORMAL LOW (ref 1.6–2.3)

## 2023-09-27 LAB — VITAMIN B12: Vitamin B-12: 533 pg/mL (ref 232–1245)

## 2023-09-27 LAB — VITAMIN B1: Thiamine: 201 nmol/L — ABNORMAL HIGH (ref 66.5–200.0)

## 2023-09-27 LAB — TSH: TSH: 1.48 u[IU]/mL (ref 0.450–4.500)

## 2023-09-27 LAB — COPPER, SERUM: Copper: 77 ug/dL — ABNORMAL LOW (ref 80–158)

## 2023-09-27 LAB — FOLATE: Folate: >20 ng/mL (ref >3.0)

## 2023-09-27 NOTE — Telephone Encounter (Signed)
 Copied from CRM [id number]. Topic: General - Other >> Sep 26, 2023  4:23 PM Lisa Galvan wrote: Reason for CRM: Patient calling in to let Dr Lincoln Renshaw know she ended up going to the emergency room cause her leg and foot was swelled up, Saturday was when she visited the ER. Tomorrow she is going to [facility] for ultrasound so will not need the order that was put in by Dr Lincoln Renshaw. Wanted to inform PCP of all of this.

## 2023-09-30 ENCOUNTER — Ambulatory Visit: Payer: Self-pay | Admitting: Family Medicine

## 2023-10-08 DIAGNOSIS — I890 Lymphedema, not elsewhere classified: Secondary | ICD-10-CM | POA: Diagnosis not present

## 2023-10-08 DIAGNOSIS — M25572 Pain in left ankle and joints of left foot: Secondary | ICD-10-CM | POA: Diagnosis not present

## 2023-10-08 DIAGNOSIS — G8929 Other chronic pain: Secondary | ICD-10-CM | POA: Diagnosis not present

## 2023-10-08 DIAGNOSIS — E1142 Type 2 diabetes mellitus with diabetic polyneuropathy: Secondary | ICD-10-CM | POA: Diagnosis not present

## 2023-10-08 DIAGNOSIS — M109 Gout, unspecified: Secondary | ICD-10-CM | POA: Diagnosis not present

## 2023-10-08 DIAGNOSIS — M545 Low back pain, unspecified: Secondary | ICD-10-CM | POA: Diagnosis not present

## 2023-10-09 ENCOUNTER — Other Ambulatory Visit: Payer: Self-pay

## 2023-10-11 NOTE — Patient Instructions (Signed)
 Thank you for allowing the Complex Care Management team to participate in your care. It was great speaking with you!  We will follow up on November 07, 2023 at 1130. Please do not hesitate to contact me if you require assistance prior to our next outreach.    Roxie Cord [facility] Population Health RN Care Manager Direct Dial: [phone]  Fax: [phone] Website: [email or web address]

## 2023-10-11 NOTE — Patient Outreach (Signed)
 Complex Care Management   Visit Note    Name:  Lisa Galvan MRN: [id number] DOB: 1961-01-13  Situation: Referral received for Complex Care Management related to Diabetes, Hypertension, HLD. I obtained verbal consent from Patient.  Visit completed with Lisa Galvan via telephone.  Background:   Past Medical History:  Diagnosis Date   Allergy    Anemia    Arthritis    spine   Bilateral leg edema    Chest pain 12/15/2014   Chronic sciatica 12/10/2014   COVID-19    2023   DDD (degenerative disc disease), lumbar    Essential hypertension 12/10/2014   Gastroesophageal reflux disease without esophagitis    Mixed hyperlipidemia    Neuropathy    Obesity (BMI 30.0-34.9) 12/10/2014   Pneumonia 1968   Sleep apnea    new cpap   Type 2 diabetes mellitus without complication (HCC) 12/10/2014   Umbilical hernia     Assessment: Patient Reported Symptoms: Cognitive Cognitive Status: Alert and oriented to person, place, and time, Normal speech and language skills Cognitive/Intellectual Conditions Management [RPT]: None reported or documented in medical history or problem list Health Maintenance Behaviors: Annual physical exam, Stress management, Social activities Healing Pattern: Average Health Facilitated by: Pain control, Rest, Stress management  Neurological Neurological Review of Symptoms: No symptoms reported Neurological Conditions:  (Neuropathy) Neurological Management Strategies: Medication therapy, Routine screening Neurological Self-Management Outcome: 4 (good)  HEENT HEENT Symptoms Reported: No symptoms reported HEENT Management Strategies: Routine screening  Cardiovascular Cardiovascular Symptoms Reported: No symptoms reported Does patient have uncontrolled Hypertension?: No Cardiovascular Conditions: Hypertension, High blood cholesterol, Heart failure Cardiovascular Management Strategies: Coping strategies, Medical device, Medication therapy, Routine screening, Diet  modification Cardiovascular Self-Management Outcome: 4 (good)  Respiratory Respiratory Symptoms Reported: No symptoms reported Respiratory Conditions: Sleep disordered breathing Respiratory Self-Management Outcome: 4 (good)  Endocrine Patient reports the following symptoms related to hypoglycemia or hyperglycemia : No symptoms reported Is patient diabetic?: Yes Is patient checking blood sugars at home?: Yes Endocrine Conditions: Diabetes, Vitamin D  deficiency Endocrine Management Strategies: Diet modification, Medication therapy, Routine screening, Medical device Endocrine Self-Management Outcome: 4 (good)  Gastrointestinal Gastrointestinal Symptoms Reported: No symptoms reported Gastrointestinal Conditions: Other Other Gastrointestinal Conditions: History of cholelithiasis and  Fatty Liver Gastrointestinal Management Strategies: Coping strategies, Diet modification Gastrointestinal Self-Management Outcome: 4 (good) Nutrition Risk Screen (CP): No indicators present  Genitourinary Genitourinary Symptoms Reported: No symptoms reported  Integumentary Integumentary Symptoms Reported: Other Other Integumentary Symptoms: Reports intermittent episodes of generalized itching Skin Management Strategies: Medication therapy Skin Self-Management Outcome: 4 (good)  Musculoskeletal Musculoskelatal Symptoms Reviewed: Other Other Musculoskeletal Symptoms: Reports being unable to ambulate on uneven surfaces. Reports using walker as needed when ambulating outside of the home Musculoskeletal Conditions: Osteoarthritis, Other Other Musculoskeletal Conditions: History of left ankle fracture, History of DVT, Degenerative Disc Disease Musculoskeletal Management Strategies: Medical device, Medication therapy, Coping strategies, Routine screening Musculoskeletal Self-Management Outcome: 4 (good) Falls in the past year?: No Number of falls in past year: 1 or less Was there an injury with Fall?: No Fall Risk  Category Calculator: 0 Patient Fall Risk Level: Low Fall Risk Patient at Risk for Falls Due to: Medication side effect, Other (Comment) (History of left leg fractures)  Psychosocial Psychosocial Symptoms Reported: No symptoms reported Behavioral Health Conditions: Depression Behavioral Management Strategies: Coping strategies, Medication therapy Behavioral Health Self-Management Outcome: 4 (good) Major Change/Loss/Stressor/Fears (CP): Medical condition, self Behaviors When Feeling Stressed/Fearful: Rest Techniques to Cope with Loss/Stress/Change: Medication, Spiritual practice(s) Quality of Family Relationships: supportive Do you  feel physically threatened by others?: No      10/09/2023   11:09 AM  Depression screen PHQ 2/9  Decreased Interest 0  Down, Depressed, Hopeless 0  PHQ - 2 Score 0    There were no vitals filed for this visit.  Medications Reviewed Today     Reviewed by Roxie Cord, RN (Registered Nurse) on 10/09/23 at 1046  Med List Status: <None>   Medication Order Taking? Sig Documenting Provider Last Dose Status Informant  Accu-Chek Softclix Lancets lancets [id number]  TEST TWICE DAILY Lincoln Renshaw, Megan P, DO  Active Self  apixaban  (ELIQUIS ) 5 MG TABS tablet [id number] Yes Take 1 tablet (5 mg total) by mouth 2 (two) times daily. Johnson, Megan P, DO  Active   atorvastatin  (LIPITOR) 20 MG tablet [id number] Yes Take 1 tablet (20 mg total) by mouth daily. TAKE 1 TABLET(20 MG) BY MOUTH DAILY Johnson, Megan P, DO  Active   B Complex-C-Folic Acid  (B COMPLEX-VITAMIN C-FOLIC ACID ) 1 MG tablet [id number] Yes Take 1 tablet by mouth daily with breakfast. Lincoln Renshaw, Megan P, DO  Active   Cholecalciferol  (VITAMIN D -3) 5000 units TABS [id number] Yes Take 5,000 Units by mouth daily. [provider]  Active Self  Cholecalciferol  (VITAMIN D3) 125 MCG (5000 UT) CAPS [id number]  Take 1 capsule (5,000 Units total) by mouth daily. Johnson, Megan P, DO  Active   clotrimazole -betamethasone   (LOTRISONE ) cream [id number] Yes Apply 1 Application topically daily. Terre Ferri P, DO  Active Self  diclofenac  Sodium (VOLTAREN ) 1 % GEL [id number] Yes Apply 4 g topically 4 (four) times daily. Johnson, Megan P, DO  Active Self  DULoxetine  (CYMBALTA ) 60 MG capsule [id number] Yes Take 1 capsule (60 mg total) by mouth daily. Johnson, Megan P, DO  Active   EPINEPHrine  0.3 mg/0.3 mL IJ SOAJ injection [id number]  Inject 0.3 mg into the muscle as needed for anaphylaxis. Lincoln Renshaw, Megan P, DO  Active Self  folic acid  (FOLVITE ) 1 MG tablet [id number]  Take 1 tablet (1 mg total) by mouth daily. Lincoln Renshaw, Megan P, DO  Active   gabapentin  (NEURONTIN ) 100 MG capsule [id number] Yes Take 2 capsules (200 mg total) by mouth every morning.  Patient taking differently: Take 200 mg by mouth 2 (two) times daily.   Johnson, Megan P, DO  Active   gabapentin  (NEURONTIN ) 400 MG capsule [id number] Yes Take 1 capsule (400 mg total) by mouth at bedtime. Johnson, Megan P, DO  Active   ketoconazole  (NIZORAL ) 2 % cream [id number]  Apply 1 Application topically daily. Aileen Alexanders, NP  Active   Magnesium  Oxide -Mg Supplement 500 MG TABS [id number] Yes Take 1 tablet (500 mg total) by mouth daily. Lincoln Renshaw, Megan P, DO  Active   metFORMIN  (GLUCOPHAGE ) 500 MG tablet [id number] Yes Take 1 tablet (500 mg total) by mouth daily with breakfast.  Patient taking differently: Take 500 mg by mouth 2 (two) times daily with a meal.   Lincoln Renshaw, Megan P, DO  Active   omeprazole  (PRILOSEC) 40 MG capsule [id number] Yes TAKE 1 CAPSULE(40 MG) BY MOUTH BID  Patient taking differently: daily. TAKE 1 CAPSULE(40 MG) BY MOUTH BID   Johnson, Megan P, DO  Active   polyethylene glycol (MIRALAX / GLYCOLAX) 17 g packet [id number] Yes Take by mouth. [provider]  Active   senna (SENOKOT) 8.6 MG tablet [id number]  Take 2 tablets by mouth at bedtime.  Patient not taking: Reported on 10/09/2023   [provider]  Active   thiamine  (VITAMIN B1)  100  MG tablet [id number] Yes Take 1 tablet (100 mg total) by mouth daily. Johnson, Megan P, DO  Active   traZODone  (DESYREL ) 50 MG tablet [id number] Yes Take 1 tablet (50 mg total) by mouth at bedtime as needed for sleep. Lincoln Renshaw, Megan P, DO  Active   vitamin B-12 (CYANOCOBALAMIN ) 1000 MCG tablet [id number]  Take 1,000 mcg by mouth 2 (two) times daily. [provider]  Active Self  Med List Note Tyson Gals, CPhT 01/31/21 1151):               Recommendation:   Continue Current Plan of Care  Follow Up Plan:   Telephone follow up appointment with Nurse Case Manager on November 07, 2023   Roxie Cord [facility] RN Care Manager Direct Dial: [phone]  Fax: [phone] Website: [email or web address]

## 2023-10-14 ENCOUNTER — Ambulatory Visit (INDEPENDENT_AMBULATORY_CARE_PROVIDER_SITE_OTHER): Admitting: Family Medicine

## 2023-10-14 ENCOUNTER — Other Ambulatory Visit: Payer: Self-pay | Admitting: Family Medicine

## 2023-10-14 ENCOUNTER — Encounter: Payer: Self-pay | Admitting: Family Medicine

## 2023-10-14 ENCOUNTER — Ambulatory Visit: Payer: Self-pay | Admitting: Family Medicine

## 2023-10-14 VITALS — BP 136/82 | HR 79 | Ht 62.0 in | Wt 144.6 lb

## 2023-10-14 DIAGNOSIS — L299 Pruritus, unspecified: Secondary | ICD-10-CM | POA: Diagnosis not present

## 2023-10-14 MED ORDER — LEVOCETIRIZINE DIHYDROCHLORIDE 5 MG PO TABS: 5.0000 mg | ORAL_TABLET | Freq: Every evening | ORAL | 1 refills | Status: AC

## 2023-10-14 MED ORDER — FAMOTIDINE 20 MG PO TABS: 20.0000 mg | ORAL_TABLET | Freq: Two times a day (BID) | ORAL | 3 refills | Status: DC

## 2023-10-14 NOTE — Telephone Encounter (Signed)
 FYI Only or Action Required?: FYI only for provider.  Patient was last seen in primary care on 09/20/2023 by Vicci Bouchard P, DO. Called Nurse Triage reporting Pruritis. Symptoms began several weeks ago. Interventions attempted: Rest, hydration, or home remedies. Symptoms are: unchanged.  Triage Disposition: See PCP When Office is Open (Within 3 Days)  Patient/caregiver understands and will follow disposition?: Yes                         Copied from CRM [id number]. Topic: Clinical - Red Word Triage >> Oct 14, 2023  9:20 AM Turkey B wrote: [facility] that prompted transfer to Nurse Triage: pt has itching on chest Reason for Disposition  [1] Cause unknown AND [2] present > 7 days  Answer Assessment - Initial Assessment Questions Itching on chest and lower back- moderate intermittent Goes away if puts cocoa butter on it Patient states her PCP said she may look at liver if this didn't stop Pt states its been going on for a while  Protocols used: Itching - Localized-A-AH

## 2023-10-14 NOTE — Patient Instructions (Signed)
 STOP the Allegra  START XYZAL and PEPCID  to help with the itching Take 1 metformin  in the AM  Take 2 (100mg ) gabapentin  in the AM and 1 400mg  gabapentin  in the PM

## 2023-10-14 NOTE — Progress Notes (Signed)
 BP 136/82   Pulse 79   Ht 5' 2 (1.575 m)   Wt 144 lb 9.6 oz (65.6 kg)   SpO2 97%   BMI 26.45 kg/m    Subjective:    Patient ID: Lisa Galvan, female    DOB: 10-Oct-1960, 63 y.o.   MRN: [id number]  HPI: Lisa Galvan is a 63 y.o. female  Chief Complaint  Patient presents with   ITCHING    Chest, ABD, back   ITCHING Duration:  months  Location: chest and abdomen and back  Itching: yes Burning: no Redness: no Oozing: no Scaling: no Blisters: no Painful: no Fevers: no Change in detergents/soaps/personal care products: no Recent illness: no Recent travel:no History of same: yes Context: stable Alleviating factors: nothing Treatments attempted:hydroxyzine , benadryl , allegra , moisturizer Shortness of breath: no  Throat/tongue swelling: no Myalgias/arthralgias: no  Relevant past medical, surgical, family and social history reviewed and updated as indicated. Interim medical history since our last visit reviewed. Allergies and medications reviewed and updated.  Review of Systems  Constitutional: Negative.   Respiratory: Negative.    Cardiovascular: Negative.   Gastrointestinal: Negative.   Musculoskeletal: Negative.   Skin: Negative.   Psychiatric/Behavioral: Negative.      Per HPI unless specifically indicated above     Objective:    BP 136/82   Pulse 79   Ht 5' 2 (1.575 m)   Wt 144 lb 9.6 oz (65.6 kg)   SpO2 97%   BMI 26.45 kg/m   Wt Readings from Last 3 Encounters:  10/14/23 144 lb 9.6 oz (65.6 kg)  09/20/23 145 lb 4.8 oz (65.9 kg)  08/12/23 142 lb 9.6 oz (64.7 kg)    Physical Exam Vitals and nursing note reviewed.  Constitutional:      General: She is not in acute distress.    Appearance: Normal appearance. She is not ill-appearing, toxic-appearing or diaphoretic.  HENT:     Head: Normocephalic and atraumatic.     Right Ear: External ear normal.     Left Ear: External ear normal.     Nose: Nose normal.     Mouth/Throat:     Mouth: Mucous  membranes are moist.     Pharynx: Oropharynx is clear.   Eyes:     General: No scleral icterus.       Right eye: No discharge.        Left eye: No discharge.     Extraocular Movements: Extraocular movements intact.     Conjunctiva/sclera: Conjunctivae normal.     Pupils: Pupils are equal, round, and reactive to light.    Cardiovascular:     Rate and Rhythm: Normal rate and regular rhythm.     Pulses: Normal pulses.     Heart sounds: Normal heart sounds. No murmur heard.    No friction rub. No gallop.  Pulmonary:     Effort: Pulmonary effort is normal. No respiratory distress.     Breath sounds: Normal breath sounds. No stridor. No wheezing, rhonchi or rales.  Chest:     Chest wall: No tenderness.   Musculoskeletal:        General: Normal range of motion.     Cervical back: Normal range of motion and neck supple.   Skin:    General: Skin is warm and dry.     Capillary Refill: Capillary refill takes less than 2 seconds.     Coloration: Skin is not jaundiced or pale.     Findings: No bruising, erythema, lesion or  rash.   Neurological:     General: No focal deficit present.     Mental Status: She is alert and oriented to person, place, and time. Mental status is at baseline.   Psychiatric:        Mood and Affect: Mood normal.        Behavior: Behavior normal.        Thought Content: Thought content normal.        Judgment: Judgment normal.     Results for orders placed or performed in visit on 09/20/23  Bayer DCA Hb A1c Waived   Collection Time: 09/20/23  2:35 PM  Result Value Ref Range   HB A1C (BAYER DCA - WAIVED) 6.1 (H) 4.8 - 5.6 %  Microalbumin, Urine Waived   Collection Time: 09/20/23  2:35 PM  Result Value Ref Range   Microalb, Ur Waived 80 (H) 0 - 19 mg/L   Creatinine, Urine Waived 200 10 - 300 mg/dL   Microalb/Creat Ratio <30 <30 mg/g  CBC with Differential/Platelet   Collection Time: 09/20/23  2:39 PM  Result Value Ref Range   WBC 7.5 3.4 - 10.8  x10E3/uL   RBC 4.34 3.77 - 5.28 x10E6/uL   Hemoglobin 12.1 11.1 - 15.9 g/dL   Hematocrit 60.5 65.9 - 46.6 %   MCV 91 79 - 97 fL   MCH 27.9 26.6 - 33.0 pg   MCHC 30.7 (L) 31.5 - 35.7 g/dL   RDW 86.8 88.2 - 84.5 %   Platelets 228 150 - 450 x10E3/uL   Neutrophils 52 Not Estab. %   Lymphs 41 Not Estab. %   Monocytes 6 Not Estab. %   Eos 1 Not Estab. %   Basos 0 Not Estab. %   Neutrophils Absolute 3.9 1.4 - 7.0 x10E3/uL   Lymphocytes Absolute 3.1 0.7 - 3.1 x10E3/uL   Monocytes Absolute 0.4 0.1 - 0.9 x10E3/uL   EOS (ABSOLUTE) 0.1 0.0 - 0.4 x10E3/uL   Basophils Absolute 0.0 0.0 - 0.2 x10E3/uL   Immature Granulocytes 0 Not Estab. %   Immature Grans (Abs) 0.0 0.0 - 0.1 x10E3/uL  Comprehensive metabolic panel with GFR   Collection Time: 09/20/23  2:39 PM  Result Value Ref Range   Glucose 78 70 - 99 mg/dL   BUN 15 8 - 27 mg/dL   Creatinine, Ser 9.22 0.57 - 1.00 mg/dL   eGFR 86 >40 fO/fpw/8.26   BUN/Creatinine Ratio 19 12 - 28   Sodium 140 134 - 144 mmol/L   Potassium 4.0 3.5 - 5.2 mmol/L   Chloride 102 96 - 106 mmol/L   CO2 24 20 - 29 mmol/L   Calcium  10.2 8.7 - 10.3 mg/dL   Total Protein 6.5 6.0 - 8.5 g/dL   Albumin 4.4 3.9 - 4.9 g/dL   Globulin, Total 2.1 1.5 - 4.5 g/dL   Bilirubin Total 0.3 0.0 - 1.2 mg/dL   Alkaline Phosphatase 95 44 - 121 IU/L   AST 16 0 - 40 IU/L   ALT 14 0 - 32 IU/L  Lipid Panel w/o Chol/HDL Ratio   Collection Time: 09/20/23  2:39 PM  Result Value Ref Range   Cholesterol, Total 164 100 - 199 mg/dL   Triglycerides 73 0 - 149 mg/dL   HDL 93 >60 mg/dL   VLDL Cholesterol Cal 14 5 - 40 mg/dL   LDL Chol Calc (NIH) 57 0 - 99 mg/dL  TSH   Collection Time: 09/20/23  2:39 PM  Result Value Ref Range  TSH 1.480 0.450 - 4.500 uIU/mL  B12   Collection Time: 09/20/23  2:39 PM  Result Value Ref Range   Vitamin B-12 533 232 - 1,245 pg/mL  Folate   Collection Time: 09/20/23  2:39 PM  Result Value Ref Range   Folate >20.0 >3.0 ng/mL  Magnesium    Collection  Time: 09/20/23  2:39 PM  Result Value Ref Range   Magnesium  1.4 (L) 1.6 - 2.3 mg/dL  VITAMIN D  25 Hydroxy (Vit-D Deficiency, Fractures)   Collection Time: 09/20/23  2:39 PM  Result Value Ref Range   Vit D, 25-Hydroxy 70.9 30.0 - 100.0 ng/mL  Vitamin B1   Collection Time: 09/20/23  2:39 PM  Result Value Ref Range   Thiamine  201.0 (H) 66.5 - 200.0 nmol/L  Zinc    Collection Time: 09/20/23  2:39 PM  Result Value Ref Range   Zinc  75 44 - 115 ug/dL  Copper , serum   Collection Time: 09/20/23  2:39 PM  Result Value Ref Range   Copper  77 (L) 80 - 158 ug/dL  Microscopic Examination   Collection Time: 09/20/23  3:12 PM   Urine  Result Value Ref Range   WBC, UA 0-5 0 - 5 /hpf   RBC, Urine None seen 0 - 2 /hpf   Epithelial Cells (non renal) 0-10 0 - 10 /hpf   Crystals Present (A) N/A   Crystal Type Calcium  Oxalate N/A   Bacteria, UA None seen None seen/Few  Urinalysis, Routine w reflex microscopic   Collection Time: 09/20/23  3:12 PM  Result Value Ref Range   Specific Gravity, UA 1.025 1.005 - 1.030   pH, UA 7.0 5.0 - 7.5   Color, UA Yellow Yellow   Appearance Ur Clear Clear   Leukocytes,UA Trace (A) Negative   Protein,UA Trace (A) Negative/Trace   Glucose, UA Negative Negative   Ketones, UA 1+ (A) Negative   RBC, UA Negative Negative   Bilirubin, UA Negative Negative   Urobilinogen, Ur 1.0 0.2 - 1.0 mg/dL   Nitrite, UA Negative Negative   Microscopic Examination See below:   Urine Culture   Collection Time: 09/23/23  4:54 PM   Specimen: Urine   UR  Result Value Ref Range   Urine Culture, Routine Final report    Organism ID, Bacteria Comment       Assessment & Plan:   Problem List Items Addressed This Visit   None Visit Diagnoses       Itching    -  Primary   No rash. Concern for nerve pain. Will check CMP. Will start xyzal and pepcid  and recheck in 2-3 weeks. If no better, likely not allergic. Call with any concerns   Relevant Orders   Comprehensive metabolic panel  with GFR   CBC with Differential/Platelet   Ambulatory referral to Allergy        Follow up plan: Return in about 3 weeks (around 11/04/2023) for virtual OK.

## 2023-10-15 ENCOUNTER — Other Ambulatory Visit: Payer: Self-pay | Admitting: Family Medicine

## 2023-10-15 ENCOUNTER — Ambulatory Visit: Payer: Self-pay | Admitting: Family Medicine

## 2023-10-15 LAB — CBC WITH DIFFERENTIAL/PLATELET
Basophils Absolute: 0 10*3/uL (ref 0.0–0.2)
Basos: 1 %
EOS (ABSOLUTE): 0 10*3/uL (ref 0.0–0.4)
Eos: 1 %
Hematocrit: 40.3 % (ref 34.0–46.6)
Hemoglobin: 12.6 g/dL (ref 11.1–15.9)
Immature Grans (Abs): 0 10*3/uL (ref 0.0–0.1)
Immature Granulocytes: 0 %
Lymphocytes Absolute: 2.6 10*3/uL (ref 0.7–3.1)
Lymphs: 42 %
MCH: 27.1 pg (ref 26.6–33.0)
MCHC: 31.3 g/dL — ABNORMAL LOW (ref 31.5–35.7)
MCV: 87 fL (ref 79–97)
Monocytes Absolute: 0.4 10*3/uL (ref 0.1–0.9)
Monocytes: 6 %
Neutrophils Absolute: 3.2 10*3/uL (ref 1.4–7.0)
Neutrophils: 50 %
Platelets: 344 10*3/uL (ref 150–450)
RBC: 4.65 x10E6/uL (ref 3.77–5.28)
RDW: 13.5 % (ref 11.7–15.4)
WBC: 6.2 10*3/uL (ref 3.4–10.8)

## 2023-10-15 LAB — COMPREHENSIVE METABOLIC PANEL WITH GFR
ALT: 14 IU/L (ref 0–32)
AST: 16 IU/L (ref 0–40)
Albumin: 4.1 g/dL (ref 3.9–4.9)
Alkaline Phosphatase: 177 IU/L — ABNORMAL HIGH (ref 44–121)
BUN/Creatinine Ratio: 20 (ref 12–28)
BUN: 13 mg/dL (ref 8–27)
Bilirubin Total: 0.3 mg/dL (ref 0.0–1.2)
CO2: 23 mmol/L (ref 20–29)
Calcium: 9.8 mg/dL (ref 8.7–10.3)
Chloride: 103 mmol/L (ref 96–106)
Creatinine, Ser: 0.64 mg/dL (ref 0.57–1.00)
Globulin, Total: 2.4 g/dL (ref 1.5–4.5)
Glucose: 79 mg/dL (ref 70–99)
Potassium: 3.9 mmol/L (ref 3.5–5.2)
Sodium: 142 mmol/L (ref 134–144)
Total Protein: 6.5 g/dL (ref 6.0–8.5)
eGFR: 99 mL/min/{1.73_m2} (ref >59)

## 2023-10-15 NOTE — Telephone Encounter (Signed)
 Requested medications are due for refill today.  no  Requested medications are on the active medications list.  yes  Last refill. 09/20/2023  Future visit scheduled.   yes  Notes to clinic.  Pt has both 100 and 400mg  doses prescribed.     Requested Prescriptions  Pending Prescriptions Disp Refills   gabapentin  (NEURONTIN ) 400 MG capsule [Pharmacy Med Name: GABAPENTIN  400MG  CAPSULES] 90 capsule 0    Sig: TAKE 1 CAPSULE(400 MG) BY MOUTH AT BEDTIME     Neurology: Anticonvulsants - gabapentin  Failed - 10/15/2023  5:24 PM      Failed - Valid encounter within last 12 months    Recent Outpatient Visits           Yesterday Itching   [facility] [place], Megan P, DO   3 weeks ago Routine general medical examination at a health care facility   [facility], [place] P, DO   2 months ago Recurrent falls   [facility] [place], [place] P, DO   3 months ago Acute deep vein thrombosis (DVT) of other specified vein of left lower extremity [facility])   [place] [facility], Megan P, DO              Passed - Cr in normal range and within 360 days    Creatinine, Ser  Date Value Ref Range Status  10/14/2023 0.64 0.57 - 1.00 mg/dL Final         Passed - Completed PHQ-2 or PHQ-9 in the last 360 days

## 2023-10-15 NOTE — Telephone Encounter (Unsigned)
 Copied from CRM [id number]. Topic: Clinical - Medication Refill >> Oct 15, 2023  1:15 PM Tiffany S wrote: Medication: ketoconazole  (NIZORAL ) 2 % cream [[id number]  Has the patient contacted their pharmacy? Yes (Agent: If no, request that the patient contact the pharmacy for the refill. If patient does not wish to contact the pharmacy document the reason why and proceed with request.) (Agent: If yes, when and what did the pharmacy advise?)  This is the patient's preferred pharmacy:  [id number] [place], [place] - [address] AT [facility] OF [address] [place] [address] [place] [address] Phone: [phone] Fax: [phone]   Is this the correct pharmacy for this prescription? Yes If no, delete pharmacy and type the correct one.   Has the prescription been filled recently? Yes  Is the patient out of the medication? Yes  Has the patient been seen for an appointment in the last year OR does the patient have an upcoming appointment? Yes  Can we respond through MyChart? Yes  Agent: Please be advised that Rx refills may take up to 3 business days. We ask that you follow-up with your pharmacy.

## 2023-10-15 NOTE — Telephone Encounter (Signed)
 Copied from CRM [id number]. Topic: Clinical - Medication Refill >> Oct 15, 2023  1:15 PM Tiffany S wrote: Medication: ketoconazole  (NIZORAL ) 2 % cream [[id number]  Has the patient contacted their pharmacy? Yes (Agent: If no, request that the patient contact the pharmacy for the refill. If patient does not wish to contact the pharmacy document the reason why and proceed with request.) (Agent: If yes, when and what did the pharmacy advise?)  This is the patient's preferred pharmacy:  [id number] [place], [place] - [address] AT [facility] [address] [place] [address] [place] [address] Phone: [phone] Fax: [phone]   Is this the correct pharmacy for this prescription? Yes If no, delete pharmacy and type the correct one.   Has the prescription been filled recently? Yes  Is the patient out of the medication? Yes  Has the patient been seen for an appointment in the last year OR does the patient have an upcoming appointment? Yes  Can we respond through MyChart? Yes  Agent: Please be advised that Rx refills may take up to 3 business days. We ask that you follow-up with your pharmacy.

## 2023-10-16 ENCOUNTER — Other Ambulatory Visit: Payer: Self-pay | Admitting: Nurse Practitioner

## 2023-10-17 NOTE — Telephone Encounter (Signed)
 Duplicate, already sent request     Requested Prescriptions  Pending Prescriptions Disp Refills   ketoconazole  (NIZORAL ) 2 % cream [Pharmacy Med Name: KETOCONAZOLE  2% CREAM 30GM] 30 g 0    Sig: APPLY TOPICALLY TO THE AFFECTED AREA DAILY     Not Delegated - Over the Counter: OTC 2 Failed - 10/17/2023 10:42 AM      Failed - This refill cannot be delegated      Failed - Valid encounter within last 12 months    Recent Outpatient Visits           3 days ago Itching   [place] [facility] [place], Megan P, DO   3 weeks ago Routine general medical examination at a health care facility   [facility], [place] P, DO   2 months ago Recurrent falls   [place] [facility], [place] P, DO   3 months ago Acute deep vein thrombosis (DVT) of other specified vein of left lower extremity [facility])   [place] [facility] [place], Megan P, DO

## 2023-10-17 NOTE — Telephone Encounter (Signed)
 Requested medication (s) are due for refill today: yes  Requested medication (s) are on the active medication list: yes  Last refill:  08/26/23   Future visit scheduled: yes  Notes to clinic:  Unable to refill per protocol, cannot delegate.      Requested Prescriptions  Pending Prescriptions Disp Refills   ketoconazole  (NIZORAL ) 2 % cream 30 g 0    Sig: Apply 1 Application topically daily.     Not Delegated - Over the Counter: OTC 2 Failed - 10/17/2023 10:41 AM      Failed - This refill cannot be delegated      Failed - Valid encounter within last 12 months    Recent Outpatient Visits           3 days ago Itching   [place] [facility] [place], [place] P, DO   3 weeks ago Routine general medical examination at a health care facility   [facility], [place] P, DO   2 months ago Recurrent falls   [place] [facility] [place], [place] P, DO   3 months ago Acute deep vein thrombosis (DVT) of other specified vein of left lower extremity [facility])    [facility] [place], Megan P, DO

## 2023-10-31 ENCOUNTER — Other Ambulatory Visit: Payer: Self-pay | Admitting: Family Medicine

## 2023-11-01 NOTE — Telephone Encounter (Signed)
 Requested Prescriptions  Pending Prescriptions Disp Refills   apixaban  (ELIQUIS ) 5 MG TABS tablet [Pharmacy Med Name: ELIQUIS  5MG  TABLETS] 180 tablet 0    Sig: TAKE 1 TABLET(5 MG) BY MOUTH TWICE DAILY     Hematology:  Anticoagulants - apixaban  Passed - 11/01/2023  4:42 PM      Passed - PLT in normal range and within 360 days    Platelets  Date Value Ref Range Status  10/14/2023 344 150 - 450 x10E3/uL Final         Passed - HGB in normal range and within 360 days    Hemoglobin  Date Value Ref Range Status  10/14/2023 12.6 11.1 - 15.9 g/dL Final         Passed - HCT in normal range and within 360 days    Hematocrit  Date Value Ref Range Status  10/14/2023 40.3 34.0 - 46.6 % Final         Passed - Cr in normal range and within 360 days    Creatinine, Ser  Date Value Ref Range Status  10/14/2023 0.64 0.57 - 1.00 mg/dL Final         Passed - AST in normal range and within 360 days    AST  Date Value Ref Range Status  10/14/2023 16 0 - 40 IU/L Final         Passed - ALT in normal range and within 360 days    ALT  Date Value Ref Range Status  10/14/2023 14 0 - 32 IU/L Final         Passed - Valid encounter within last 12 months    Recent Outpatient Visits           2 weeks ago Itching   [place] [facility] [place], Megan P, DO   1 month ago Routine general medical examination at a health care facility   [facility] [place], [place] P, DO   2 months ago Recurrent falls   [place] [facility] [place], [place] P, DO   3 months ago Acute deep vein thrombosis (DVT) of other specified vein of left lower extremity [facility])   [place] [facility] [place], Megan P, DO

## 2023-11-04 NOTE — Telephone Encounter (Signed)
 Copied from CRM [id number]. Topic: Clinical - Prescription Issue >> Nov 04, 2023 10:56 AM Charlet HERO wrote: Reason for CRM: Patient is calling bc she went to get her Eliquis  and the price $202 for the script she would like to know if there is anyway to get help to lower the pricing.

## 2023-11-04 NOTE — Telephone Encounter (Signed)
 I have provided patient with information from co-pay assistance card for her through mychart. She is aware to also allow the PA team to see if one would also help lower costs. If they fail to help then she is aware we can talk about her options at that point.

## 2023-11-05 ENCOUNTER — Other Ambulatory Visit (HOSPITAL_COMMUNITY): Payer: Self-pay

## 2023-11-05 NOTE — Telephone Encounter (Signed)
 Pharmacy does have medication billed through pt's insurance, meaning that a PA is not needed at this time, however, because it is filled at another pharmacy, I am unable to see any information regarding why the cost is as high as it is. Thank you

## 2023-11-07 ENCOUNTER — Other Ambulatory Visit: Payer: Self-pay

## 2023-11-08 ENCOUNTER — Telehealth: Admitting: Family Medicine

## 2023-11-08 ENCOUNTER — Encounter: Payer: Self-pay | Admitting: Family Medicine

## 2023-11-08 ENCOUNTER — Telehealth: Payer: Self-pay

## 2023-11-08 VITALS — Ht 62.0 in | Wt 145.0 lb

## 2023-11-08 DIAGNOSIS — M792 Neuralgia and neuritis, unspecified: Secondary | ICD-10-CM

## 2023-11-08 DIAGNOSIS — L299 Pruritus, unspecified: Secondary | ICD-10-CM

## 2023-11-08 MED ORDER — EPINEPHRINE 0.3 MG/0.3ML IJ SOAJ: 0.3000 mg | INTRAMUSCULAR | 12 refills | Status: AC | PRN

## 2023-11-08 MED ORDER — KETOCONAZOLE 2 % EX CREA: TOPICAL_CREAM | Freq: Every day | CUTANEOUS | 1 refills | Status: AC | PRN

## 2023-11-08 NOTE — Progress Notes (Signed)
   11/08/2023  Patient ID: Lisa Galvan, female   DOB: 06-21-1960, 63 y.o.   MRN: [id number]  Clinic routed request to assist patient with price of Eliquis .  I contacted the pharmacy, and the patient picked up a 90 day supply for $30 on 7/15.  This reflects coverage with insurance and the manufacturer copay card, which makes the copay $10/month.  I contacted the patient, and she was expecting a $10 copay but now understands that was for a 1 month supply versus the 3 month supply she received.  Channing DELENA Mealing, PharmD, DPLA

## 2023-11-08 NOTE — Progress Notes (Signed)
 Ht 5' 2 (1.575 m)   Wt 145 lb (65.8 kg)   BMI 26.52 kg/m    Subjective:    Patient ID: Lisa Galvan, female    DOB: 01/10/1961, 63 y.o.   MRN: [id number]  HPI: Lisa Galvan is a 63 y.o. female  Chief Complaint  Patient presents with   Generalized Itching    Pt states that the itching is not currently on her chest area   She feels like the itching is a lot better. She is not having any itching in her low back or on her arms. She does have some itching on her chest- but notes that it's a lot better than it was and is mainly happening at night. She's tolerating her medicine well. She has not been confused. Everything else seems to be doing well. No other concerns or complaints at this time.   Relevant past medical, surgical, family and social history reviewed and updated as indicated. Interim medical history since our last visit reviewed. Allergies and medications reviewed and updated.  Review of Systems  Constitutional: Negative.   Respiratory: Negative.    Cardiovascular: Negative.   Musculoskeletal: Negative.   Skin: Negative.        + itching  Psychiatric/Behavioral: Negative.      Per HPI unless specifically indicated above     Objective:    Ht 5' 2 (1.575 m)   Wt 145 lb (65.8 kg)   BMI 26.52 kg/m   Wt Readings from Last 3 Encounters:  11/08/23 145 lb (65.8 kg)  10/14/23 144 lb 9.6 oz (65.6 kg)  09/20/23 145 lb 4.8 oz (65.9 kg)    Physical Exam Vitals and nursing note reviewed.  Constitutional:      General: She is not in acute distress.    Appearance: Normal appearance. She is not ill-appearing, toxic-appearing or diaphoretic.  HENT:     Head: Normocephalic and atraumatic.     Right Ear: External ear normal.     Left Ear: External ear normal.     Nose: Nose normal.     Mouth/Throat:     Mouth: Mucous membranes are moist.     Pharynx: Oropharynx is clear.  Eyes:     General: No scleral icterus.       Right eye: No discharge.        Left eye: No  discharge.     Conjunctiva/sclera: Conjunctivae normal.     Pupils: Pupils are equal, round, and reactive to light.  Pulmonary:     Effort: Pulmonary effort is normal. No respiratory distress.     Comments: Speaking in full sentences Musculoskeletal:        General: Normal range of motion.     Cervical back: Normal range of motion.  Skin:    Coloration: Skin is not jaundiced or pale.     Findings: No bruising, erythema, lesion or rash.  Neurological:     Mental Status: She is alert and oriented to person, place, and time. Mental status is at baseline.  Psychiatric:        Mood and Affect: Mood normal.        Behavior: Behavior normal.        Thought Content: Thought content normal.        Judgment: Judgment normal.     Results for orders placed or performed in visit on 10/14/23  Comprehensive metabolic panel with GFR   Collection Time: 10/14/23  3:23 PM  Result Value Ref Range  Glucose 79 70 - 99 mg/dL   BUN 13 8 - 27 mg/dL   Creatinine, Ser 9.35 0.57 - 1.00 mg/dL   eGFR 99 >40 fO/fpw/8.26   BUN/Creatinine Ratio 20 12 - 28   Sodium 142 134 - 144 mmol/L   Potassium 3.9 3.5 - 5.2 mmol/L   Chloride 103 96 - 106 mmol/L   CO2 23 20 - 29 mmol/L   Calcium  9.8 8.7 - 10.3 mg/dL   Total Protein 6.5 6.0 - 8.5 g/dL   Albumin 4.1 3.9 - 4.9 g/dL   Globulin, Total 2.4 1.5 - 4.5 g/dL   Bilirubin Total 0.3 0.0 - 1.2 mg/dL   Alkaline Phosphatase 177 (H) 44 - 121 IU/L   AST 16 0 - 40 IU/L   ALT 14 0 - 32 IU/L  CBC with Differential/Platelet   Collection Time: 10/14/23  3:23 PM  Result Value Ref Range   WBC 6.2 3.4 - 10.8 x10E3/uL   RBC 4.65 3.77 - 5.28 x10E6/uL   Hemoglobin 12.6 11.1 - 15.9 g/dL   Hematocrit 59.6 65.9 - 46.6 %   MCV 87 79 - 97 fL   MCH 27.1 26.6 - 33.0 pg   MCHC 31.3 (L) 31.5 - 35.7 g/dL   RDW 86.4 88.2 - 84.5 %   Platelets 344 150 - 450 x10E3/uL   Neutrophils 50 Not Estab. %   Lymphs 42 Not Estab. %   Monocytes 6 Not Estab. %   Eos 1 Not Estab. %   Basos 1  Not Estab. %   Neutrophils Absolute 3.2 1.4 - 7.0 x10E3/uL   Lymphocytes Absolute 2.6 0.7 - 3.1 x10E3/uL   Monocytes Absolute 0.4 0.1 - 0.9 x10E3/uL   EOS (ABSOLUTE) 0.0 0.0 - 0.4 x10E3/uL   Basophils Absolute 0.0 0.0 - 0.2 x10E3/uL   Immature Granulocytes 0 Not Estab. %   Immature Grans (Abs) 0.0 0.0 - 0.1 x10E3/uL      Assessment & Plan:   Problem List Items Addressed This Visit       Other   Neurogenic pain (Chronic)   Unclear if still having some neuropathic pain or if her itching is allergic in nature. Given decreased mental status on higher doses of gabapentin - very hesitant to increase her dose. Continue xyzal  and pepcid  and keep appt with allergy. Recheck at follow up in September. Call with any concerns.        Other Visit Diagnoses       Itching    -  Primary   Better on xyzal  and pepcid . Has appt with allergy in September. Continue current regimen and await allergy appt.        Follow up plan: Return for As scheduled.   This visit was completed via video visit through MyChart due to the restrictions of the COVID-19 pandemic. All issues as above were discussed and addressed. Physical exam was done as above through visual confirmation on video through MyChart. If it was felt that the patient should be evaluated in the office, they were directed there. The patient verbally consented to this visit. Location of the patient: parking lot Location of the provider: work Those involved with this call:  Provider: Duwaine Louder, DO CMA: York Fogo, CMA, Front Desk/Registration: Claretta Maiden  Time spent on call: 15 minutes with patient face to face via video conference. More than 50% of this time was spent in counseling and coordination of care. 23 minutes total spent in review of patient's record and preparation of their chart.

## 2023-11-08 NOTE — Assessment & Plan Note (Signed)
 Unclear if still having some neuropathic pain or if her itching is allergic in nature. Given decreased mental status on higher doses of gabapentin - very hesitant to increase her dose. Continue xyzal  and pepcid  and keep appt with allergy. Recheck at follow up in September. Call with any concerns.

## 2023-11-09 NOTE — Patient Outreach (Signed)
 Complex Care Management   Visit Note    Name:  Lisa Galvan MRN: [id number] DOB: 1960/08/09  Situation: Referral received for Complex Care Management related to Diabetes. I obtained verbal consent from Patient.  Visit completed with Lisa Galvan via telephone.  Background:   Past Medical History:  Diagnosis Date   Allergy    Anemia    Arthritis    spine   Bilateral leg edema    Chest pain 12/15/2014   Chronic sciatica 12/10/2014   COVID-19    2023   DDD (degenerative disc disease), lumbar    Essential hypertension 12/10/2014   Gastroesophageal reflux disease without esophagitis    Mixed hyperlipidemia    Neuropathy    Obesity (BMI 30.0-34.9) 12/10/2014   Pneumonia 1968   Sleep apnea    new cpap   Type 2 diabetes mellitus without complication (HCC) 12/10/2014   Umbilical hernia     Assessment: Patient Reported Symptoms: Cognitive Cognitive Status: No symptoms reported, Normal speech and language skills Cognitive/Intellectual Conditions Management [RPT]: None reported or documented in medical history or problem list Health Maintenance Behaviors: Annual physical exam, Stress management, Social activities Healing Pattern: Average Health Facilitated by: Rest  Neurological Neurological Review of Symptoms: No symptoms reported Neurological Management Strategies: Medication therapy, Routine screening Neurological Self-Management Outcome: 4 (good)  HEENT HEENT Symptoms Reported: No symptoms reported HEENT Management Strategies: Routine screening HEENT Self-Management Outcome: 4 (good)  Cardiovascular Cardiovascular Symptoms Reported: No symptoms reported Does patient have uncontrolled Hypertension?: No Cardiovascular Self-Management Outcome: 4 (good)  Respiratory Respiratory Symptoms Reported: No symptoms reported Respiratory Management Strategies: Routine screening Respiratory Self-Management Outcome: 4 (good)  Endocrine Endocrine Symptoms Reported: No symptoms reported Is  patient diabetic?: Yes Is patient checking blood sugars at home?: Yes List most recent blood sugar readings, include date and time of day: Reports fasting reading today of 103 mg/dl Endocrine Self-Management Outcome: 4 (good)  Gastrointestinal Gastrointestinal Symptoms Reported: No symptoms reported Gastrointestinal Self-Management Outcome: 4 (good) Nutrition Risk Screen (CP): No indicators present  Genitourinary Genitourinary Symptoms Reported: No symptoms reported Genitourinary Self-Management Outcome: 4 (good)  Integumentary Integumentary Symptoms Reported: Other Other Integumentary Symptoms: Reports episodes of generalized itching. Pending follow up with PCP on 11/08/2023  Musculoskeletal Musculoskelatal Symptoms Reviewed: Other Other Musculoskeletal Symptoms: Reports being unable to ambulate on uneven surfaces. Reports using a walker as needed when ambulating outside of the home.  Psychosocial Psychosocial Symptoms Reported: No symptoms reported Quality of Family Relationships: supportive, helpful Do you feel physically threatened by others?: No      11/07/2023   12:05 PM  Depression screen PHQ 2/9  Decreased Interest 0  Down, Depressed, Hopeless 0  PHQ - 2 Score 0    There were no vitals filed for this visit.  Medications Reviewed Today     Reviewed by Karoline Lima, RN (Registered Nurse) on 11/07/23 at 1137  Med List Status: <None>   Medication Order Taking? Sig Documenting Provider Last Dose Status Informant  Accu-Chek Softclix Lancets lancets [id number]  TEST TWICE DAILY Vicci Bouchard P, DO  Active Self  apixaban  (ELIQUIS ) 5 MG TABS tablet [id number]  TAKE 1 TABLET(5 MG) BY MOUTH TWICE DAILY Johnson, Megan P, DO  Active   atorvastatin  (LIPITOR) 20 MG tablet [id number]  Take 1 tablet (20 mg total) by mouth daily. TAKE 1 TABLET(20 MG) BY MOUTH DAILY Johnson, Megan P, DO  Active   B Complex-C-Folic Acid  (B COMPLEX-VITAMIN C-FOLIC ACID ) 1 MG tablet [id number]  Take 1 tablet by  mouth daily with  breakfast. Vicci, Megan P, DO  Active   Cholecalciferol  (VITAMIN D -3) 5000 units TABS [id number]  Take 5,000 Units by mouth daily. [provider]  Active Self  Cholecalciferol  (VITAMIN D3) 125 MCG (5000 UT) CAPS [id number]  Take 1 capsule (5,000 Units total) by mouth daily. Johnson, Megan P, DO  Active   clotrimazole -betamethasone  (LOTRISONE ) cream [id number]  Apply 1 Application topically daily. Vicci Bouchard P, DO  Active Self  diclofenac  Sodium (VOLTAREN ) 1 % GEL [id number]  Apply 4 g topically 4 (four) times daily. Vicci, Megan P, DO  Active Self  DULoxetine  (CYMBALTA ) 60 MG capsule [id number]  Take 1 capsule (60 mg total) by mouth daily. Johnson, Megan P, DO  Active   EPINEPHrine  0.3 mg/0.3 mL IJ SOAJ injection [id number]  Inject 0.3 mg into the muscle as needed for anaphylaxis. Johnson, Megan P, DO  Active Self  famotidine  (PEPCID ) 20 MG tablet [id number]  Take 1 tablet (20 mg total) by mouth 2 (two) times daily. Johnson, Megan P, DO  Active   folic acid  (FOLVITE ) 1 MG tablet [id number]  Take 1 tablet (1 mg total) by mouth daily. Johnson, Megan P, DO  Active   gabapentin  (NEURONTIN ) 100 MG capsule [id number]  Take 2 capsules (200 mg total) by mouth every morning.  Patient taking differently: Take 200 mg by mouth 2 (two) times daily.   Vicci Bouchard P, DO  Active   gabapentin  (NEURONTIN ) 400 MG capsule [id number]  TAKE 1 CAPSULE(400 MG) BY MOUTH AT BEDTIME Melvin Pao, NP  Active   ketoconazole  (NIZORAL ) 2 % cream [id number]  APPLY TOPICALLY TO THE AFFECTED AREA DAILY Melvin Pao, NP  Active   levocetirizine (XYZAL  ALLERGY 24HR) 5 MG tablet [id number]  Take 1 tablet (5 mg total) by mouth every evening. Johnson, Megan P, DO  Active   Magnesium  Oxide -Mg Supplement 500 MG TABS [id number]  Take 1 tablet (500 mg total) by mouth daily. Vicci, Megan P, DO  Active   metFORMIN  (GLUCOPHAGE ) 500 MG tablet [id number] Yes Take 1 tablet (500 mg total) by mouth daily with  breakfast.  Patient taking differently: Take 500 mg by mouth 2 (two) times daily with a meal.   Vicci, Megan P, DO  Active   Multiple Vitamin (MULTIVITAMIN) tablet [id number]  Take 1 tablet by mouth daily. [provider]  Active   omeprazole  (PRILOSEC) 40 MG capsule [id number]  TAKE 1 CAPSULE(40 MG) BY MOUTH BID  Patient taking differently: daily. TAKE 1 CAPSULE(40 MG) BY MOUTH BID   Johnson, Megan P, DO  Active   polyethylene glycol (MIRALAX / GLYCOLAX) 17 g packet [id number]  Take by mouth. [provider]  Active   senna (SENOKOT) 8.6 MG tablet [id number]  Take 2 tablets by mouth at bedtime. [provider]  Active   thiamine  (VITAMIN B1) 100 MG tablet [id number]  Take 1 tablet (100 mg total) by mouth daily. Johnson, Megan P, DO  Active   traZODone  (DESYREL ) 50 MG tablet [id number]  Take 1 tablet (50 mg total) by mouth at bedtime as needed for sleep. Johnson, Megan P, DO  Active   vitamin B-12 (CYANOCOBALAMIN ) 1000 MCG tablet [id number]  Take 1,000 mcg by mouth 2 (two) times daily. [provider]  Active Self  Med List Note Lauralyn Neth, CPhT 01/31/21 1151):               Recommendation:   PCP Follow-up on November 08, 2023 Continue Current Plan of Care  Follow Up Plan:   Telephone  follow up appointment with Nurse Case Manager on December 16, 2023    Jackson Acron [facility] RN Care Manager Direct Dial: [phone]  Fax: [phone] Website: [email or web address]

## 2023-11-09 NOTE — Patient Instructions (Signed)
 Thank you for allowing the Complex Care Management team to participate in your care. It was great speaking with you!  We will follow up on December 16, 2023 at 3:30. Please do not hesitate to contact me if you require assistance prior to our next outreach.   A reminder to ALL patients/family/friends, please call the [place]  National Suicide Prevention Lifeline: (403)260-0869 or TTY: 513-440-7576 TTY (603) 431-1878) to talk to a trained counselor if you are experiencing a Mental Health or Behavioral Health Crisis or need someone to talk to.     Jackson Acron [facility] Population Health RN Care Manager Direct Dial: [phone]  Fax: [phone] Website: [email or web address]

## 2023-11-11 DIAGNOSIS — M25572 Pain in left ankle and joints of left foot: Secondary | ICD-10-CM | POA: Diagnosis not present

## 2023-11-11 DIAGNOSIS — M19172 Post-traumatic osteoarthritis, left ankle and foot: Secondary | ICD-10-CM | POA: Diagnosis not present

## 2023-11-11 DIAGNOSIS — E1142 Type 2 diabetes mellitus with diabetic polyneuropathy: Secondary | ICD-10-CM | POA: Diagnosis not present

## 2023-11-11 DIAGNOSIS — M7672 Peroneal tendinitis, left leg: Secondary | ICD-10-CM | POA: Diagnosis not present

## 2023-11-13 ENCOUNTER — Other Ambulatory Visit: Payer: Self-pay | Admitting: Podiatry

## 2023-11-13 DIAGNOSIS — M19172 Post-traumatic osteoarthritis, left ankle and foot: Secondary | ICD-10-CM

## 2023-11-13 DIAGNOSIS — E1142 Type 2 diabetes mellitus with diabetic polyneuropathy: Secondary | ICD-10-CM

## 2023-11-13 DIAGNOSIS — M25572 Pain in left ankle and joints of left foot: Secondary | ICD-10-CM

## 2023-11-13 DIAGNOSIS — M7672 Peroneal tendinitis, left leg: Secondary | ICD-10-CM

## 2023-11-15 ENCOUNTER — Ambulatory Visit
Admission: RE | Admit: 2023-11-15 | Discharge: 2023-11-15 | Disposition: A | Discharge status: Home / Self Care | Source: Ambulatory Visit | Attending: Podiatry | Admitting: Podiatry

## 2023-11-15 DIAGNOSIS — M25572 Pain in left ankle and joints of left foot: Secondary | ICD-10-CM

## 2023-11-15 DIAGNOSIS — M7672 Peroneal tendinitis, left leg: Secondary | ICD-10-CM

## 2023-11-15 DIAGNOSIS — S92022A Displaced fracture of anterior process of left calcaneus, initial encounter for closed fracture: Secondary | ICD-10-CM | POA: Diagnosis not present

## 2023-11-15 DIAGNOSIS — E1142 Type 2 diabetes mellitus with diabetic polyneuropathy: Secondary | ICD-10-CM

## 2023-11-15 DIAGNOSIS — M19172 Post-traumatic osteoarthritis, left ankle and foot: Secondary | ICD-10-CM

## 2023-11-30 ENCOUNTER — Other Ambulatory Visit: Payer: Self-pay | Admitting: Family Medicine

## 2023-12-02 ENCOUNTER — Other Ambulatory Visit: Payer: Self-pay | Admitting: Family Medicine

## 2023-12-02 NOTE — Telephone Encounter (Signed)
 Copied from CRM [id number]. Topic: Clinical - Medication Refill >> Dec 02, 2023 12:06 PM Everette C wrote: Medication: gabapentin  (NEURONTIN ) 100 MG capsule [[id number]]  Has the patient contacted their pharmacy? Yes (Agent: If no, request that the patient contact the pharmacy for the refill. If patient does not wish to contact the pharmacy document the reason why and proceed with request.) (Agent: If yes, when and what did the pharmacy advise?)  This is the patient's preferred pharmacy:  [id number] [place], [place] - [address] AT [facility] OF [address] [place] [address] [place] [address] Phone: [phone] Fax: [phone]  Is this the correct pharmacy for this prescription? Yes If no, delete pharmacy and type the correct one.   Has the prescription been filled recently? Yes  Is the patient out of the medication? Yes  Has the patient been seen for an appointment in the last year OR does the patient have an upcoming appointment? Yes  Can we respond through MyChart? No  Agent: Please be advised that Rx refills may take up to 3 business days. We ask that you follow-up with your pharmacy.

## 2023-12-04 NOTE — Telephone Encounter (Signed)
 Requested Prescriptions  Pending Prescriptions Disp Refills   gabapentin  (NEURONTIN ) 100 MG capsule [Pharmacy Med Name: GABAPENTIN  100MG  CAPSULES] 180 capsule 0    Sig: TAKE 2 CAPSULES(200 MG) BY MOUTH EVERY MORNING     Neurology: Anticonvulsants - gabapentin  Passed - 12/04/2023 12:53 PM      Passed - Cr in normal range and within 360 days    Creatinine, Ser  Date Value Ref Range Status  10/14/2023 0.64 0.57 - 1.00 mg/dL Final         Passed - Completed PHQ-2 or PHQ-9 in the last 360 days      Passed - Valid encounter within last 12 months    Recent Outpatient Visits           3 weeks ago Itching   [facility] [place], [place], DO   1 month ago Itching   [place] [facility] [place], [place], DO   2 months ago Routine general medical examination at a health care facility   [facility], [place], DO   3 months ago Recurrent falls   [place] [facility] [place], [place], DO   4 months ago Acute deep vein thrombosis (DVT) of other specified vein of left lower extremity [facility])   [place] [facility] [place], Megan P, DO

## 2023-12-05 NOTE — Telephone Encounter (Signed)
 Called pharmacy - was on hold for 12 minutes.  Need to call again. Called again - pt has refill available. Pharmacy will prepare.

## 2023-12-05 NOTE — Telephone Encounter (Signed)
 Requested Prescriptions  Refused Prescriptions Disp Refills   gabapentin  (NEURONTIN ) 400 MG capsule 90 capsule 0     Neurology: Anticonvulsants - gabapentin  Passed - 12/05/2023  1:03 PM      Passed - Cr in normal range and within 360 days    Creatinine, Ser  Date Value Ref Range Status  10/14/2023 0.64 0.57 - 1.00 mg/dL Final         Passed - Completed PHQ-2 or PHQ-9 in the last 360 days      Passed - Valid encounter within last 12 months    Recent Outpatient Visits           3 weeks ago Itching   [place] [place], [place], DO   1 month ago Itching   [place] [place], [place], DO   2 months ago Routine general medical examination at a health care facility   [facility], [place], DO   3 months ago Recurrent falls   [facility] [place], [place], DO   4 months ago Acute deep vein thrombosis (DVT) of other specified vein of left lower extremity [facility])   [place] [place], Megan P, DO

## 2023-12-16 ENCOUNTER — Other Ambulatory Visit: Payer: Self-pay

## 2023-12-16 NOTE — Patient Outreach (Unsigned)
 Complex Care Management   Visit Note  12/16/2023  Name:  Lisa Galvan MRN: [id number] DOB: 11/13/1960  Situation: Referral received for Complex Care Management related to {Criteria:32550} I obtained verbal consent from {CHL AMB Patient/Caregiver:28184}.  Visit completed with {CHL AMB Patient/Caregiver:28184}  {VISIT LOCATION:32553}  Background:   Past Medical History:  Diagnosis Date   Allergy    Anemia    Arthritis    spine   Bilateral leg edema    Chest pain 12/15/2014   Chronic sciatica 12/10/2014   COVID-19    2023   DDD (degenerative disc disease), lumbar    Essential hypertension 12/10/2014   Gastroesophageal reflux disease without esophagitis    Mixed hyperlipidemia    Neuropathy    Obesity (BMI 30.0-34.9) 12/10/2014   Pneumonia 1968   Sleep apnea    new cpap   Type 2 diabetes mellitus without complication (HCC) 12/10/2014   Umbilical hernia     Assessment: Patient Reported Symptoms:  Cognitive        Neurological      HEENT        Cardiovascular      Respiratory      Endocrine      Gastrointestinal        Genitourinary      Integumentary      Musculoskeletal          Psychosocial       Quality of Family Relationships: supportive, helpful Do you feel physically threatened by others?: No    12/16/2023    PHQ2-9 Depression Screening   Little interest or pleasure in doing things    Feeling down, depressed, or hopeless    PHQ-2 - Total Score    Trouble falling or staying asleep, or sleeping too much    Feeling tired or having little energy    Poor appetite or overeating     Feeling bad about yourself - or that you are a failure or have let yourself or your family down    Trouble concentrating on things, such as reading the newspaper or watching television    Moving or speaking so slowly that other people could have noticed.  Or the opposite - being so fidgety or restless that you have been moving around a lot more than usual    Thoughts  that you would be better off dead, or hurting yourself in some way    PHQ2-9 Total Score    If you checked off any problems, how difficult have these problems made it for you to do your work, take care of things at home, or get along with other people    Depression Interventions/Treatment      There were no vitals filed for this visit.  Medications Reviewed Today     Reviewed by Karoline Lima, RN (Registered Nurse) on 12/16/23 at 1535  Med List Status: <None>   Medication Order Taking? Sig Documenting Provider Last Dose Status Informant  Accu-Chek Softclix Lancets lancets [id number]  TEST TWICE DAILY Vicci Bouchard P, DO  Active Self  apixaban  (ELIQUIS ) 5 MG TABS tablet [id number]  TAKE 1 TABLET(5 MG) BY MOUTH TWICE DAILY Johnson, Megan P, DO  Active   atorvastatin  (LIPITOR) 20 MG tablet [id number]  Take 1 tablet (20 mg total) by mouth daily. TAKE 1 TABLET(20 MG) BY MOUTH DAILY Johnson, Megan P, DO  Active   B Complex-C-Folic Acid  (B COMPLEX-VITAMIN C-FOLIC ACID ) 1 MG tablet [id number]  Take 1 tablet by mouth daily with breakfast. Vicci Bouchard  P, DO  Active   Cholecalciferol  (VITAMIN D -3) 5000 units TABS [id number]  Take 5,000 Units by mouth daily. [provider]  Active Self  Cholecalciferol  (VITAMIN D3) 125 MCG (5000 UT) CAPS [id number]  Take 1 capsule (5,000 Units total) by mouth daily. Johnson, Megan P, DO  Active   clotrimazole -betamethasone  (LOTRISONE ) cream [id number]  Apply 1 Application topically daily. Vicci Bouchard P, DO  Active Self  diclofenac  Sodium (VOLTAREN ) 1 % GEL [id number]  Apply 4 g topically 4 (four) times daily. Vicci Bouchard P, DO  Active Self  DULoxetine  (CYMBALTA ) 60 MG capsule [id number]  Take 1 capsule (60 mg total) by mouth daily. Johnson, Megan P, DO  Active   EPINEPHrine  0.3 mg/0.3 mL IJ SOAJ injection [id number]  Inject 0.3 mg into the muscle as needed for anaphylaxis. Johnson, Megan P, DO  Active   famotidine  (PEPCID ) 20 MG tablet [id number]  Take 1 tablet  (20 mg total) by mouth 2 (two) times daily. Johnson, Megan P, DO  Active   folic acid  (FOLVITE ) 1 MG tablet [id number]  Take 1 tablet (1 mg total) by mouth daily. Vicci, Megan P, DO  Active   gabapentin  (NEURONTIN ) 100 MG capsule [id number]  TAKE 2 CAPSULES(200 MG) BY MOUTH EVERY MORNING Johnson, Megan P, DO  Active   gabapentin  (NEURONTIN ) 400 MG capsule [id number]  TAKE 1 CAPSULE(400 MG) BY MOUTH AT BEDTIME Melvin Pao, NP  Active   ketoconazole  (NIZORAL ) 2 % cream [id number]  Apply topically daily as needed for irritation. Johnson, Megan P, DO  Active   levocetirizine (XYZAL  ALLERGY 24HR) 5 MG tablet [id number]  Take 1 tablet (5 mg total) by mouth every evening. Johnson, Megan P, DO  Active   Magnesium  Oxide -Mg Supplement 500 MG TABS [id number]  Take 1 tablet (500 mg total) by mouth daily. Vicci, Megan P, DO  Active   metFORMIN  (GLUCOPHAGE ) 500 MG tablet [id number]  Take 1 tablet (500 mg total) by mouth daily with breakfast.  Patient taking differently: Take 500 mg by mouth 2 (two) times daily with a meal.   Vicci, Megan P, DO  Active   Multiple Vitamin (MULTIVITAMIN) tablet [id number]  Take 1 tablet by mouth daily. [provider]  Active   omeprazole  (PRILOSEC) 40 MG capsule [id number]  TAKE 1 CAPSULE(40 MG) BY MOUTH BID  Patient taking differently: daily. TAKE 1 CAPSULE(40 MG) BY MOUTH BID   Johnson, Megan P, DO  Active   polyethylene glycol (MIRALAX / GLYCOLAX) 17 g packet [id number]  Take by mouth. [provider]  Active   senna (SENOKOT) 8.6 MG tablet [id number]  Take 2 tablets by mouth at bedtime. [provider]  Active   thiamine  (VITAMIN B1) 100 MG tablet [id number]  Take 1 tablet (100 mg total) by mouth daily. Vicci, Megan P, DO  Active   traZODone  (DESYREL ) 50 MG tablet [id number]  Take 1 tablet (50 mg total) by mouth at bedtime as needed for sleep.  Patient not taking: Reported on 11/08/2023   Vicci Bouchard SQUIBB, DO  Active   vitamin B-12  (CYANOCOBALAMIN ) 1000 MCG tablet [id number]  Take 1,000 mcg by mouth 2 (two) times daily. [provider]  Active Self  Med List Note Lauralyn Neth, CPhT 01/31/21 1151):               Recommendation:   {RECOMMENDATONS:32554}  Follow Up Plan:   {FOLLOWUP:32559}  SIG ***

## 2023-12-17 NOTE — Patient Instructions (Signed)
 Thank you for allowing the Complex Care Management team to participate in your care. It was great speaking with you!  So glad to hear that you were able to travel and enjoy time with your family. Keep up the great work with managing your care!  Reminders: -Please attend your PCP appointment as scheduled on December 27, 2023.  We will follow up on January 16, 2024 at 3:30. Please do not hesitate to contact me if you require assistance prior to our next outreach.   Jackson Acron [facility] Population Health RN Care Manager Direct Dial: [phone]  Fax: [phone] Website: [email or web address]

## 2023-12-18 ENCOUNTER — Other Ambulatory Visit: Payer: Self-pay | Admitting: Family Medicine

## 2023-12-18 DIAGNOSIS — E1140 Type 2 diabetes mellitus with diabetic neuropathy, unspecified: Secondary | ICD-10-CM

## 2023-12-19 NOTE — Telephone Encounter (Signed)
 Requested Prescriptions  Pending Prescriptions Disp Refills   metFORMIN  (GLUCOPHAGE ) 500 MG tablet [Pharmacy Med Name: METFORMIN  500MG  TABLETS] 90 tablet 0    Sig: TAKE 1 TABLET(500 MG) BY MOUTH DAILY WITH BREAKFAST     Endocrinology:  Diabetes - Biguanides Passed - 12/19/2023  4:59 PM      Passed - Cr in normal range and within 360 days    Creatinine, Ser  Date Value Ref Range Status  10/14/2023 0.64 0.57 - 1.00 mg/dL Final         Passed - HBA1C is between 0 and 7.9 and within 180 days    HB A1C (BAYER DCA - WAIVED)  Date Value Ref Range Status  09/20/2023 6.1 (H) 4.8 - 5.6 % Final    Comment:             Prediabetes: 5.7 - 6.4          Diabetes: >6.4          Glycemic control for adults with diabetes: <7.0          Passed - eGFR in normal range and within 360 days    GFR calc Af Amer  Date Value Ref Range Status  02/18/2020 89 >59 mL/min/1.73 Final    Comment:    **In accordance with recommendations from the NKF-ASN Task force,**   [facility] is in the process of updating its eGFR calculation to the   2021 CKD-EPI creatinine equation that estimates kidney function   without a race variable.    GFR, Estimated  Date Value Ref Range Status  05/04/2023 >60 >60 mL/min Final    Comment:    (NOTE) Calculated using the CKD-EPI Creatinine Equation (2021)    eGFR  Date Value Ref Range Status  10/14/2023 99 >59 mL/min/1.73 Final         Passed - B12 Level in normal range and within 720 days    Vitamin B-12  Date Value Ref Range Status  09/20/2023 533 232 - 1,245 pg/mL Final         Passed - Valid encounter within last 6 months    Recent Outpatient Visits           1 month ago Itching   [facility] [place], [place], DO   2 months ago Itching   [place] [facility] [place], [place] P, DO   3 months ago Routine general medical examination at a health care facility   [facility], [place], DO   4  months ago Recurrent falls   [place] [facility] [place], [place], DO   5 months ago Acute deep vein thrombosis (DVT) of other specified vein of left lower extremity [facility])   [facility] [place], Megan P, DO              Passed - CBC within normal limits and completed in the last 12 months    WBC  Date Value Ref Range Status  10/14/2023 6.2 3.4 - 10.8 x10E3/uL Final  05/04/2023 2.7 (L) 4.0 - 10.5 K/uL Final   RBC  Date Value Ref Range Status  10/14/2023 4.65 3.77 - 5.28 x10E6/uL Final  05/04/2023 3.29 (L) 3.87 - 5.11 MIL/uL Final   Hemoglobin  Date Value Ref Range Status  10/14/2023 12.6 11.1 - 15.9 g/dL Final   Hematocrit  Date Value Ref Range Status  10/14/2023 40.3 34.0 - 46.6 % Final   MCHC  Date Value Ref Range Status  10/14/2023 31.3 (L) 31.5 - 35.7 g/dL Final  98/88/7974 64.8 30.0 - 36.0 g/dL Final   [facility]  Date Value Ref Range Status  10/14/2023 27.1 26.6 - 33.0 pg Final  05/04/2023 31.6 26.0 - 34.0 pg Final   MCV  Date Value Ref Range Status  10/14/2023 87 79 - 97 fL Final   No results found for: PLTCOUNTKUC, LABPLAT, POCPLA RDW  Date Value Ref Range Status  10/14/2023 13.5 11.7 - 15.4 % Final

## 2023-12-24 DIAGNOSIS — L299 Pruritus, unspecified: Secondary | ICD-10-CM | POA: Diagnosis not present

## 2023-12-24 DIAGNOSIS — Z91013 Allergy to seafood: Secondary | ICD-10-CM | POA: Diagnosis not present

## 2023-12-24 DIAGNOSIS — J3089 Other allergic rhinitis: Secondary | ICD-10-CM | POA: Diagnosis not present

## 2023-12-27 ENCOUNTER — Encounter: Payer: Self-pay | Admitting: Family Medicine

## 2023-12-27 ENCOUNTER — Ambulatory Visit: Admitting: Family Medicine

## 2023-12-27 VITALS — BP 127/85 | HR 89 | Temp 98.3°F | Ht 62.0 in | Wt 148.6 lb

## 2023-12-27 DIAGNOSIS — Z7984 Long term (current) use of oral hypoglycemic drugs: Secondary | ICD-10-CM | POA: Diagnosis not present

## 2023-12-27 DIAGNOSIS — E1140 Type 2 diabetes mellitus with diabetic neuropathy, unspecified: Secondary | ICD-10-CM | POA: Diagnosis not present

## 2023-12-27 DIAGNOSIS — Z23 Encounter for immunization: Secondary | ICD-10-CM

## 2023-12-27 DIAGNOSIS — M792 Neuralgia and neuritis, unspecified: Secondary | ICD-10-CM

## 2023-12-27 DIAGNOSIS — E1340 Other specified diabetes mellitus with diabetic neuropathy, unspecified: Secondary | ICD-10-CM

## 2023-12-27 LAB — BAYER DCA HB A1C WAIVED: HB A1C (BAYER DCA - WAIVED): 6.3 % — ABNORMAL HIGH (ref 4.8–5.6)

## 2023-12-27 NOTE — Assessment & Plan Note (Addendum)
 Continues with significant discomfort. Cannot push dose of gabapentin  due to decreased mental status on higher doses. Will refer her to PM&R to see if she's a candidate for the capsaisin treatment. Await their input.

## 2023-12-27 NOTE — Assessment & Plan Note (Signed)
 Doing great with A1c of 6.3. Continue current regimen. Continue to monitor. Refills given.

## 2023-12-27 NOTE — Progress Notes (Signed)
 BP 127/85 (BP Location: Left Arm, Patient Position: Sitting, Cuff Size: Normal)   Pulse 89   Temp 98.3 F (36.8 C) (Oral)   Ht 5' 2 (1.575 m)   Wt 148 lb 9.6 oz (67.4 kg)   SpO2 96%   BMI 27.18 kg/m    Subjective:    Patient ID: Lisa Galvan, female    DOB: 04/03/1961, 63 y.o.   MRN: [id number]  HPI: Lisa Galvan is a 63 y.o. female  Chief Complaint  Patient presents with   Diabetes   DIABETES Hypoglycemic episodes:no Polydipsia/polyuria: no Visual disturbance: no Chest pain: no Paresthesias: yes- worsening Glucose Monitoring: yes Taking Insulin ?: no Blood Pressure Monitoring: not checking Retinal Examination: Up to Date Foot Exam: Up to Date Diabetic Education: Completed Pneumovax: Up to Date Influenza: Up to Date Aspirin : no  Relevant past medical, surgical, family and social history reviewed and updated as indicated. Interim medical history since our last visit reviewed. Allergies and medications reviewed and updated.  Review of Systems  Constitutional: Negative.   Respiratory: Negative.    Cardiovascular: Negative.   Musculoskeletal: Negative.   Skin: Negative.   Neurological:  Positive for numbness. Negative for dizziness, tremors, seizures, syncope, facial asymmetry, speech difficulty, weakness, light-headedness and headaches.  Psychiatric/Behavioral: Negative.      Per HPI unless specifically indicated above     Objective:    BP 127/85 (BP Location: Left Arm, Patient Position: Sitting, Cuff Size: Normal)   Pulse 89   Temp 98.3 F (36.8 C) (Oral)   Ht 5' 2 (1.575 m)   Wt 148 lb 9.6 oz (67.4 kg)   SpO2 96%   BMI 27.18 kg/m   Wt Readings from Last 3 Encounters:  12/27/23 148 lb 9.6 oz (67.4 kg)  11/08/23 145 lb (65.8 kg)  10/14/23 144 lb 9.6 oz (65.6 kg)    Physical Exam Vitals and nursing note reviewed.  Constitutional:      General: She is not in acute distress.    Appearance: Normal appearance. She is not ill-appearing,  toxic-appearing or diaphoretic.  HENT:     Head: Normocephalic and atraumatic.     Right Ear: External ear normal.     Left Ear: External ear normal.     Nose: Nose normal.     Mouth/Throat:     Mouth: Mucous membranes are moist.     Pharynx: Oropharynx is clear.  Eyes:     General: No scleral icterus.       Right eye: No discharge.        Left eye: No discharge.     Extraocular Movements: Extraocular movements intact.     Conjunctiva/sclera: Conjunctivae normal.     Pupils: Pupils are equal, round, and reactive to light.  Cardiovascular:     Rate and Rhythm: Normal rate and regular rhythm.     Pulses: Normal pulses.     Heart sounds: Normal heart sounds. No murmur heard.    No friction rub. No gallop.  Pulmonary:     Effort: Pulmonary effort is normal. No respiratory distress.     Breath sounds: Normal breath sounds. No stridor. No wheezing, rhonchi or rales.  Chest:     Chest wall: No tenderness.  Musculoskeletal:        General: Normal range of motion.     Cervical back: Normal range of motion and neck supple.  Skin:    General: Skin is warm and dry.     Capillary Refill: Capillary refill takes less  than 2 seconds.     Coloration: Skin is not jaundiced or pale.     Findings: No bruising, erythema, lesion or rash.  Neurological:     General: No focal deficit present.     Mental Status: She is alert and oriented to person, place, and time. Mental status is at baseline.  Psychiatric:        Mood and Affect: Mood normal.        Behavior: Behavior normal.        Thought Content: Thought content normal.        Judgment: Judgment normal.     Results for orders placed or performed in visit on 10/14/23  Comprehensive metabolic panel with GFR   Collection Time: 10/14/23  3:23 PM  Result Value Ref Range   Glucose 79 70 - 99 mg/dL   BUN 13 8 - 27 mg/dL   Creatinine, Ser 9.35 0.57 - 1.00 mg/dL   eGFR 99 >40 fO/fpw/8.26   BUN/Creatinine Ratio 20 12 - 28   Sodium 142 134 -  144 mmol/L   Potassium 3.9 3.5 - 5.2 mmol/L   Chloride 103 96 - 106 mmol/L   CO2 23 20 - 29 mmol/L   Calcium  9.8 8.7 - 10.3 mg/dL   Total Protein 6.5 6.0 - 8.5 g/dL   Albumin 4.1 3.9 - 4.9 g/dL   Globulin, Total 2.4 1.5 - 4.5 g/dL   Bilirubin Total 0.3 0.0 - 1.2 mg/dL   Alkaline Phosphatase 177 (H) 44 - 121 IU/L   AST 16 0 - 40 IU/L   ALT 14 0 - 32 IU/L  CBC with Differential/Platelet   Collection Time: 10/14/23  3:23 PM  Result Value Ref Range   WBC 6.2 3.4 - 10.8 x10E3/uL   RBC 4.65 3.77 - 5.28 x10E6/uL   Hemoglobin 12.6 11.1 - 15.9 g/dL   Hematocrit 59.6 65.9 - 46.6 %   MCV 87 79 - 97 fL   MCH 27.1 26.6 - 33.0 pg   MCHC 31.3 (L) 31.5 - 35.7 g/dL   RDW 86.4 88.2 - 84.5 %   Platelets 344 150 - 450 x10E3/uL   Neutrophils 50 Not Estab. %   Lymphs 42 Not Estab. %   Monocytes 6 Not Estab. %   Eos 1 Not Estab. %   Basos 1 Not Estab. %   Neutrophils Absolute 3.2 1.4 - 7.0 x10E3/uL   Lymphocytes Absolute 2.6 0.7 - 3.1 x10E3/uL   Monocytes Absolute 0.4 0.1 - 0.9 x10E3/uL   EOS (ABSOLUTE) 0.0 0.0 - 0.4 x10E3/uL   Basophils Absolute 0.0 0.0 - 0.2 x10E3/uL   Immature Granulocytes 0 Not Estab. %   Immature Grans (Abs) 0.0 0.0 - 0.1 x10E3/uL      Assessment & Plan:   Problem List Items Addressed This Visit       Endocrine   Type 2 diabetes mellitus with diabetic neuropathy, unspecified (HCC) - Primary   Doing great with A1c of 6.3. Continue current regimen. Continue to monitor. Refills given.       Relevant Orders   Bayer DCA Hb A1c Waived   Neuropathy due to secondary diabetes [facility])   Relevant Orders   Ambulatory referral to Physical Medicine Rehab     Other   Neurogenic pain (Chronic)   Continues with significant discomfort. Cannot push dose of gabapentin  due to decreased mental status on higher doses. Will refer her to PM&R to see if she's a candidate for the capsaisin treatment. Await their input.  Relevant Orders   Ambulatory referral to Physical Medicine  Rehab   Other Visit Diagnoses       Needs flu shot       Flu shot given today.   Relevant Orders   Flu vaccine HIGH DOSE PF(Fluzone Trivalent)        Follow up plan: Return in about 3 months (around 03/27/2024).

## 2024-01-05 ENCOUNTER — Other Ambulatory Visit: Payer: Self-pay | Admitting: Family Medicine

## 2024-01-05 DIAGNOSIS — E785 Hyperlipidemia, unspecified: Secondary | ICD-10-CM

## 2024-01-07 NOTE — Telephone Encounter (Signed)
 Requested Prescriptions  Pending Prescriptions Disp Refills   atorvastatin  (LIPITOR) 20 MG tablet [Pharmacy Med Name: ATORVASTATIN  20MG  TABLETS] 90 tablet 1    Sig: TAKE 1 TABLET(20 MG) BY MOUTH DAILY     Cardiovascular:  Antilipid - Statins Failed - 01/07/2024 11:43 AM      Failed - Lipid Panel in normal range within the last 12 months    Cholesterol, Total  Date Value Ref Range Status  09/20/2023 164 100 - 199 mg/dL Final   LDL Chol Calc (NIH)  Date Value Ref Range Status  09/20/2023 57 0 - 99 mg/dL Final   HDL  Date Value Ref Range Status  09/20/2023 93 >39 mg/dL Final   Triglycerides  Date Value Ref Range Status  09/20/2023 73 0 - 149 mg/dL Final         Passed - Patient is not pregnant      Passed - Valid encounter within last 12 months    Recent Outpatient Visits           1 week ago Type 2 diabetes mellitus with diabetic neuropathy, without long-term current use of insulin  (HCC)   [place] [facility] [place], [place], DO   2 months ago Itching   [place] [facility] [place], [place], DO   2 months ago Itching   [place] [facility] [place], Megan P, DO   3 months ago Routine general medical examination at a health care facility   [facility] [place], [place] P, DO   4 months ago Recurrent falls    [facility] [place], [place], DO

## 2024-01-15 ENCOUNTER — Encounter: Payer: Self-pay | Admitting: Physical Medicine and Rehabilitation

## 2024-01-16 ENCOUNTER — Other Ambulatory Visit: Payer: Self-pay

## 2024-01-16 NOTE — Patient Instructions (Addendum)
 Thank you for allowing the Complex Care Management team to participate in your care. It was great speaking with you!  Congratulations on meeting your care management goals! Keep up the great work with managing your care!  Please do not hesitate to contact your primary care provider if your health needs change and you require additional assistance. The care management team will gladly assist.   Jackson Acron [facility] RN Care Manager Direct Dial: [phone]  Fax: [phone] Website: [email or web address]

## 2024-01-16 NOTE — Patient Outreach (Signed)
 Complex Care Management   Visit Note  01/16/2024  Name:  Lisa Galvan MRN: [id number] DOB: 05/06/1960  Situation: Referral received for Complex Care Management related to Diabetes I obtained verbal consent from Patient.  Visit completed with Lisa Galvan via telephone.  Background:   Past Medical History:  Diagnosis Date   Allergy    Anemia    Arthritis    spine   Bilateral leg edema    Chest pain 12/15/2014   Chronic sciatica 12/10/2014   COVID-19    2023   DDD (degenerative disc disease), lumbar    Essential hypertension 12/10/2014   Gastroesophageal reflux disease without esophagitis    Mixed hyperlipidemia    Neuropathy    Obesity (BMI 30.0-34.9) 12/10/2014   Pneumonia 1968   Sleep apnea    new cpap   Type 2 diabetes mellitus without complication 12/10/2014   Umbilical hernia       Assessment: Patient Reported Symptoms: Cognitive Cognitive Status: No symptoms reported, Normal speech and language skills Cognitive/Intellectual Conditions Management [RPT]: None reported or documented in medical history or problem list Health Maintenance Behaviors: Annual physical exam, Stress management, Social activities, Healthy diet Healing Pattern: Average Health Facilitated by: Rest, Pain control  Neurological Neurological Review of Symptoms: No symptoms reported Neurological Management Strategies: Medication therapy, Routine screening Neurological Self-Management Outcome: 4 (good)  HEENT HEENT Symptoms Reported: No symptoms reported HEENT Management Strategies: Routine screening HEENT Self-Management Outcome: 4 (good)  Cardiovascular Cardiovascular Symptoms Reported: No symptoms reported Does patient have uncontrolled Hypertension?: No Cardiovascular Self-Management Outcome: 4 (good)  Respiratory Respiratory Symptoms Reported: No symptoms reported Respiratory Management Strategies: Routine screening (Seasonal influenza vaccines and Covid boosters) Respiratory Self-Management  Outcome: 4 (good)  Endocrine Endocrine Symptoms Reported: No symptoms reported Is patient diabetic?: Yes Is patient checking blood sugars at home?: Yes Endocrine Self-Management Outcome: 5 (very good)  Gastrointestinal Gastrointestinal Symptoms Reported: No symptoms reported Gastrointestinal Self-Management Outcome: 4 (good)  Genitourinary Genitourinary Symptoms Reported: No symptoms reported Genitourinary Self-Management Outcome: 4 (good)  Integumentary Integumentary Symptoms Reported: No symptoms reported Skin Self-Management Outcome: 4 (good)  Musculoskeletal Musculoskelatal Symptoms Reviewed: Other Other Musculoskeletal Symptoms: Reports using assistive device as needed due to inability to ambulate on uneven surfaces. Reports also wearing Orthotic device/boot at home. Currently being followed by Podiatry/Dr. Lennie. Next follow up scheduled for January 20, 2024.  Psychosocial Psychosocial Symptoms Reported: No symptoms reported Quality of Family Relationships: supportive, helpful Do you feel physically threatened by others?: No    01/20/2024    PHQ2-9 Depression Screening   Little interest or pleasure in doing things Not at all  Feeling down, depressed, or hopeless Not at all    There were no vitals filed for this visit.  Medications Reviewed Today     Reviewed by Karoline Lima, RN (Registered Nurse) on 01/16/24 at 1533  Med List Status: <None>   Medication Order Taking? Sig Documenting Provider Last Dose Status Informant  Accu-Chek Softclix Lancets lancets [id number]  TEST TWICE DAILY Vicci Duwaine SQUIBB, DO  Active Self  apixaban  (ELIQUIS ) 5 MG TABS tablet [id number]  TAKE 1 TABLET(5 MG) BY MOUTH TWICE DAILY Johnson, Megan P, DO  Active   atorvastatin  (LIPITOR) 20 MG tablet [id number]  TAKE 1 TABLET(20 MG) BY MOUTH DAILY Johnson, Megan P, DO  Active   B Complex-C-Folic Acid  (B COMPLEX-VITAMIN C-FOLIC ACID ) 1 MG tablet [id number]  Take 1 tablet by mouth daily with breakfast.  Vicci, Megan P, DO  Active   Cholecalciferol  (VITAMIN D -3) 5000 units TABS  [id number]  Take 5,000 Units by mouth daily. [provider]  Active Self  Cholecalciferol  (VITAMIN D3) 125 MCG (5000 UT) CAPS [id number]  Take 1 capsule (5,000 Units total) by mouth daily. Johnson, Megan P, DO  Active   clotrimazole -betamethasone  (LOTRISONE ) cream [id number]  Apply 1 Application topically daily. Vicci Bouchard P, DO  Active Self  diclofenac  Sodium (VOLTAREN ) 1 % GEL [id number]  Apply 4 g topically 4 (four) times daily. Vicci Bouchard P, DO  Active Self  DULoxetine  (CYMBALTA ) 60 MG capsule [id number]  Take 1 capsule (60 mg total) by mouth daily. Johnson, Megan P, DO  Active   EPINEPHrine  0.3 mg/0.3 mL IJ SOAJ injection [id number]  Inject 0.3 mg into the muscle as needed for anaphylaxis. Johnson, Megan P, DO  Active   famotidine  (PEPCID ) 20 MG tablet [id number]  Take 1 tablet (20 mg total) by mouth 2 (two) times daily. Johnson, Megan P, DO  Active   folic acid  (FOLVITE ) 1 MG tablet [id number]  Take 1 tablet (1 mg total) by mouth daily. Vicci, Megan P, DO  Active   gabapentin  (NEURONTIN ) 100 MG capsule [id number]  TAKE 2 CAPSULES(200 MG) BY MOUTH EVERY MORNING Johnson, Megan P, DO  Active   gabapentin  (NEURONTIN ) 400 MG capsule [id number]  TAKE 1 CAPSULE(400 MG) BY MOUTH AT BEDTIME Melvin Pao, NP  Active   ketoconazole  (NIZORAL ) 2 % cream [id number]  Apply topically daily as needed for irritation. Johnson, Megan P, DO  Active   levocetirizine (XYZAL  ALLERGY 24HR) 5 MG tablet [id number]  Take 1 tablet (5 mg total) by mouth every evening. Johnson, Megan P, DO  Active   Magnesium  Oxide -Mg Supplement 500 MG TABS [id number]  Take 1 tablet (500 mg total) by mouth daily. Johnson, Megan P, DO  Active   metFORMIN  (GLUCOPHAGE ) 500 MG tablet [id number]  TAKE 1 TABLET(500 MG) BY MOUTH DAILY WITH BREAKFAST Johnson, Megan P, DO  Active   Multiple Vitamin (MULTIVITAMIN) tablet [id number]  Take 1 tablet by mouth daily.  [provider]  Active   omeprazole  (PRILOSEC) 40 MG capsule [id number]  TAKE 1 CAPSULE(40 MG) BY MOUTH BID  Patient taking differently: daily. TAKE 1 CAPSULE(40 MG) BY MOUTH BID   Johnson, Megan P, DO  Active   polyethylene glycol (MIRALAX / GLYCOLAX) 17 g packet [id number]  Take by mouth. [provider]  Active   senna (SENOKOT) 8.6 MG tablet [id number]  Take 2 tablets by mouth at bedtime. [provider]  Active   thiamine  (VITAMIN B1) 100 MG tablet [id number]  Take 1 tablet (100 mg total) by mouth daily. Johnson, Megan P, DO  Active   traZODone  (DESYREL ) 50 MG tablet [id number]  Take 1 tablet (50 mg total) by mouth at bedtime as needed for sleep. Johnson, Megan P, DO  Active   vitamin B-12 (CYANOCOBALAMIN ) 1000 MCG tablet [id number]  Take 1,000 mcg by mouth 2 (two) times daily. [provider]  Active Self  Med List Note Lauralyn Neth, CPhT 01/31/21 1151):               Recommendation:   Continue Current Plan of Care  Follow Up Plan:   Patient has met all care management goals. Patient has been provided contact information should new needs arise.     Jackson Acron [facility] Population Health RN Care Manager Direct Dial: [phone]  Fax: [phone] Website: [email or web address]

## 2024-01-17 ENCOUNTER — Ambulatory Visit
Admission: RE | Admit: 2024-01-17 | Discharge: 2024-01-17 | Disposition: A | Discharge status: Home / Self Care | Source: Ambulatory Visit | Attending: Family Medicine | Admitting: Family Medicine

## 2024-01-17 DIAGNOSIS — Z1231 Encounter for screening mammogram for malignant neoplasm of breast: Secondary | ICD-10-CM | POA: Insufficient documentation

## 2024-01-21 DIAGNOSIS — S92025K Nondisplaced fracture of anterior process of left calcaneus, subsequent encounter for fracture with nonunion: Secondary | ICD-10-CM | POA: Diagnosis not present

## 2024-01-27 DIAGNOSIS — M19071 Primary osteoarthritis, right ankle and foot: Secondary | ICD-10-CM | POA: Diagnosis not present

## 2024-01-27 DIAGNOSIS — S92025D Nondisplaced fracture of anterior process of left calcaneus, subsequent encounter for fracture with routine healing: Secondary | ICD-10-CM | POA: Diagnosis not present

## 2024-01-27 DIAGNOSIS — E1142 Type 2 diabetes mellitus with diabetic polyneuropathy: Secondary | ICD-10-CM | POA: Diagnosis not present

## 2024-01-27 DIAGNOSIS — M25571 Pain in right ankle and joints of right foot: Secondary | ICD-10-CM | POA: Diagnosis not present

## 2024-01-27 DIAGNOSIS — M79672 Pain in left foot: Secondary | ICD-10-CM | POA: Diagnosis not present

## 2024-01-29 ENCOUNTER — Other Ambulatory Visit: Payer: Self-pay | Admitting: Family Medicine

## 2024-01-31 DIAGNOSIS — E119 Type 2 diabetes mellitus without complications: Secondary | ICD-10-CM | POA: Diagnosis not present

## 2024-01-31 DIAGNOSIS — E1142 Type 2 diabetes mellitus with diabetic polyneuropathy: Secondary | ICD-10-CM | POA: Diagnosis not present

## 2024-01-31 NOTE — Telephone Encounter (Signed)
 Requested Prescriptions  Pending Prescriptions Disp Refills   ELIQUIS  5 MG TABS tablet [Pharmacy Med Name: ELIQUIS  5MG  TABLETS] 180 tablet 0    Sig: TAKE 1 TABLET(5 MG) BY MOUTH TWICE DAILY     Hematology:  Anticoagulants - apixaban  Passed - 01/31/2024 11:57 AM      Passed - PLT in normal range and within 360 days    Platelets  Date Value Ref Range Status  10/14/2023 344 150 - 450 x10E3/uL Final         Passed - HGB in normal range and within 360 days    Hemoglobin  Date Value Ref Range Status  10/14/2023 12.6 11.1 - 15.9 g/dL Final         Passed - HCT in normal range and within 360 days    Hematocrit  Date Value Ref Range Status  10/14/2023 40.3 34.0 - 46.6 % Final         Passed - Cr in normal range and within 360 days    Creatinine, Ser  Date Value Ref Range Status  10/14/2023 0.64 0.57 - 1.00 mg/dL Final         Passed - AST in normal range and within 360 days    AST  Date Value Ref Range Status  10/14/2023 16 0 - 40 IU/L Final         Passed - ALT in normal range and within 360 days    ALT  Date Value Ref Range Status  10/14/2023 14 0 - 32 IU/L Final         Passed - Valid encounter within last 12 months    Recent Outpatient Visits           1 month ago Type 2 diabetes mellitus with diabetic neuropathy, without long-term current use of insulin  (HCC)   [place] [facility] [place], [place], DO   2 months ago Itching   [facility] [place], [place], DO   3 months ago Itching   [place] [facility] [place], Megan P, DO   4 months ago Routine general medical examination at a health care facility   [facility] [place], Megan P, DO   5 months ago Recurrent falls   [place] [facility] [place], Duwaine SQUIBB, DO       Future Appointments             In 1 month Raulkar, Sven SQUIBB, MD [facility] Physical Medicine and Rehabilitation, CPR

## 2024-02-03 ENCOUNTER — Ambulatory Visit: Payer: Self-pay

## 2024-02-03 NOTE — Telephone Encounter (Signed)
 appt

## 2024-02-03 NOTE — Telephone Encounter (Signed)
 Routing to provider to advise.

## 2024-02-03 NOTE — Telephone Encounter (Signed)
 FYI Only or Action Required?: Action required by provider: update on patient condition.  Patient was last seen in primary care on 12/27/2023 by Vicci Bouchard P, DO.  Called Nurse Triage reporting Foot Swelling.  Symptoms began long time ago.  Interventions attempted: Nothing.  Symptoms are: gradually worsening.  Triage Disposition: See PCP Within 2 Weeks  Patient/caregiver understands and will follow disposition?: UnsureReason for Disposition  [1] MILD swelling of both ankles (i.e., pedal edema) AND [2] is a chronic symptom (recurrent or ongoing AND present > 4 weeks)  Answer Assessment - Initial Assessment Questions Pt calling to see if she can restart Triamterene . My ankles are puffy and have to wear stretchy shoes. I saw her recently but didn't think to ask for it. Due to office co-pay, can she just call it in? Money is tight and I could use that money for prescription instead. No breathing difficulty/chest pain. No appt made at this time. Please advise.      1. LOCATION: Which ankle is swollen? Where is the swelling?     Both ankles 2. ONSET: When did the swelling start?     Long time 3. SWELLING: How bad is the swelling? Or, How large is it? (e.g., mild, moderate, severe; size of localized swelling)      Puffy- can't wear some shoes 4. PAIN: Is there any pain? If Yes, ask: How bad is it? (Scale 0-10; or none, mild, moderate, severe)     denies 5. CAUSE: What do you think caused the ankle swelling?     Not sure 6. OTHER SYMPTOMS: Do you have any other symptoms? (e.g., fever, chest pain, difficulty breathing, calf pain)     Numbness- not new:  Protocols used: Ankle Swelling-A-AH

## 2024-02-03 NOTE — Telephone Encounter (Signed)
 Scheduled

## 2024-02-03 NOTE — Telephone Encounter (Signed)
    Copied from CRM [id number]. Topic: Clinical - Medical Advice >> Feb 03, 2024 10:47 AM Darshell M wrote: Reason for CRM: Patient broke both ankles. Wants to have provider refill old prescription for Triamterene  37.5 mgs to help with swelling. CB# [phone].

## 2024-02-10 ENCOUNTER — Encounter: Payer: Self-pay | Admitting: Family Medicine

## 2024-02-10 ENCOUNTER — Other Ambulatory Visit: Payer: Self-pay | Admitting: Family Medicine

## 2024-02-10 ENCOUNTER — Ambulatory Visit: Admitting: Family Medicine

## 2024-02-10 VITALS — BP 155/90 | HR 80 | Temp 97.9°F | Ht 62.0 in | Wt 155.8 lb

## 2024-02-10 DIAGNOSIS — I700 Atherosclerosis of aorta: Secondary | ICD-10-CM | POA: Diagnosis not present

## 2024-02-10 DIAGNOSIS — I10 Essential (primary) hypertension: Secondary | ICD-10-CM

## 2024-02-10 DIAGNOSIS — Z23 Encounter for immunization: Secondary | ICD-10-CM

## 2024-02-10 DIAGNOSIS — I119 Hypertensive heart disease without heart failure: Secondary | ICD-10-CM | POA: Diagnosis not present

## 2024-02-10 DIAGNOSIS — M7989 Other specified soft tissue disorders: Secondary | ICD-10-CM | POA: Diagnosis not present

## 2024-02-10 MED ORDER — HYDROCHLOROTHIAZIDE 25 MG PO TABS: 25.0000 mg | ORAL_TABLET | Freq: Every day | ORAL | 1 refills | Status: DC

## 2024-02-10 NOTE — Progress Notes (Signed)
 BP (!) 155/90   Pulse 80   Temp 97.9 F (36.6 C) (Oral)   Ht 5' 2 (1.575 m)   Wt 155 lb 12.8 oz (70.7 kg)   SpO2 98%   BMI 28.50 kg/m    Subjective:    Patient ID: Lisa Galvan, female    DOB: 1960/08/21, 63 y.o.   MRN: [id number]  HPI: Lisa Galvan is a 63 y.o. female  Chief Complaint  Patient presents with   Foot Swelling    Left foot. Onset about a month. With pain.     Leg Swelling    Left leg with pain     brusing     Mostly on leg. Denies cause. Did have a fall about month ago. Lost balance moving around in the kitchen    Legs are not swelling in the AM, but starts swelling as the day goes on. She is keeping her legs up when she's sitting, but is moving around a lot.   HYPERTENSION  Hypertension status: uncontrolled  Satisfied with current treatment? no Duration of hypertension: chronic BP monitoring frequency:  rarely BP medication side effects:  no Medication compliance: not on anything Aspirin : no Recurrent headaches: no Visual changes: no Palpitations: no Dyspnea: no Chest pain: no Lower extremity edema: no Dizzy/lightheaded: no   Relevant past medical, surgical, family and social history reviewed and updated as indicated. Interim medical history since our last visit reviewed. Allergies and medications reviewed and updated.  Review of Systems  Constitutional:  Positive for fatigue. Negative for activity change, appetite change, chills, diaphoresis, fever and unexpected weight change.  HENT: Negative.    Respiratory: Negative.    Cardiovascular:  Positive for leg swelling. Negative for chest pain and palpitations.  Musculoskeletal:  Positive for arthralgias, back pain and myalgias. Negative for gait problem, joint swelling, neck pain and neck stiffness.  Skin: Negative.   Psychiatric/Behavioral: Negative.      Per HPI unless specifically indicated above     Objective:    BP (!) 155/90   Pulse 80   Temp 97.9 F (36.6 C) (Oral)   Ht 5' 2  (1.575 m)   Wt 155 lb 12.8 oz (70.7 kg)   SpO2 98%   BMI 28.50 kg/m   Wt Readings from Last 3 Encounters:  02/10/24 155 lb 12.8 oz (70.7 kg)  12/27/23 148 lb 9.6 oz (67.4 kg)  11/08/23 145 lb (65.8 kg)    Physical Exam Vitals and nursing note reviewed.  Constitutional:      General: She is not in acute distress.    Appearance: Normal appearance. She is not ill-appearing, toxic-appearing or diaphoretic.  HENT:     Head: Normocephalic and atraumatic.     Right Ear: External ear normal.     Left Ear: External ear normal.     Nose: Nose normal.     Mouth/Throat:     Mouth: Mucous membranes are moist.     Pharynx: Oropharynx is clear.  Eyes:     General: No scleral icterus.       Right eye: No discharge.        Left eye: No discharge.     Extraocular Movements: Extraocular movements intact.     Conjunctiva/sclera: Conjunctivae normal.     Pupils: Pupils are equal, round, and reactive to light.  Cardiovascular:     Rate and Rhythm: Normal rate and regular rhythm.     Pulses: Normal pulses.     Heart sounds: Normal heart sounds.  No murmur heard.    No friction rub. No gallop.  Pulmonary:     Effort: Pulmonary effort is normal. No respiratory distress.     Breath sounds: Normal breath sounds. No stridor. No wheezing, rhonchi or rales.  Chest:     Chest wall: No tenderness.  Musculoskeletal:        General: No swelling, tenderness, deformity or signs of injury. Normal range of motion.     Cervical back: Normal range of motion and neck supple.     Right lower leg: No edema.     Left lower leg: No edema.  Skin:    General: Skin is warm and dry.     Capillary Refill: Capillary refill takes less than 2 seconds.     Coloration: Skin is not jaundiced or pale.     Findings: No bruising, erythema, lesion or rash.  Neurological:     General: No focal deficit present.     Mental Status: She is alert and oriented to person, place, and time. Mental status is at baseline.  Psychiatric:         Mood and Affect: Mood normal.        Behavior: Behavior normal.        Thought Content: Thought content normal.        Judgment: Judgment normal.     Results for orders placed or performed in visit on 12/27/23  Bayer DCA Hb A1c Waived   Collection Time: 12/27/23  9:22 AM  Result Value Ref Range   HB A1C (BAYER DCA - WAIVED) 6.3 (H) 4.8 - 5.6 %      Assessment & Plan:   Problem List Items Addressed This Visit       Cardiovascular and Mediastinum   Essential hypertension   BP running high. Will start hydrochlorothiazide  due to swelling. Follow up as scheduled.       Relevant Medications   hydrochlorothiazide  (HYDRODIURIL ) 25 MG tablet   Aortic atherosclerosis   Will keep BP and cholesterol under good control. Has not seen cardiology in years- will get her back into cardiology. Call with any concerns.       Relevant Medications   hydrochlorothiazide  (HYDRODIURIL ) 25 MG tablet   Other Relevant Orders   Ambulatory referral to Cardiology   LVH (left ventricular hypertrophy) due to hypertensive disease, without heart failure   Will keep BP and cholesterol under good control. Has not seen cardiology in years- will get her back into cardiology. Call with any concerns.       Relevant Medications   hydrochlorothiazide  (HYDRODIURIL ) 25 MG tablet   Other Relevant Orders   Ambulatory referral to Cardiology   Other Visit Diagnoses       Leg swelling    -  Primary   No evidence of swelling today. BP running quite high- will start HCTZ and recheck in about a month. Would like to see vascular- referral placed.   Relevant Orders   Ambulatory referral to Vascular Surgery   Ambulatory referral to Cardiology     Need for COVID-19 vaccine       COVID shot given today.   Relevant Orders   Pfizer Comirnaty Covid -19 Vaccine 34yrs and older        Follow up plan: Return for As scheduled.

## 2024-02-10 NOTE — Assessment & Plan Note (Signed)
 Will keep BP and cholesterol under good control. Has not seen cardiology in years- will get her back into cardiology. Call with any concerns.

## 2024-02-10 NOTE — Assessment & Plan Note (Addendum)
 BP running high. Will start hydrochlorothiazide  due to swelling. Follow up as scheduled.

## 2024-02-12 NOTE — Telephone Encounter (Signed)
 Duplicate request, refilled 02/10/24.  Requested Prescriptions  Pending Prescriptions Disp Refills   hydrochlorothiazide  (HYDRODIURIL ) 25 MG tablet [Pharmacy Med Name: HYDROCHLOROTHIAZIDE  25MG  TABLETS] 90 tablet     Sig: TAKE 1 TABLET(25 MG) BY MOUTH DAILY     Cardiovascular: Diuretics - Thiazide Failed - 02/12/2024 11:02 AM      Failed - Last BP in normal range    BP Readings from Last 1 Encounters:  02/10/24 (!) 155/90         Passed - Cr in normal range and within 180 days    Creatinine, Ser  Date Value Ref Range Status  10/14/2023 0.64 0.57 - 1.00 mg/dL Final         Passed - K in normal range and within 180 days    Potassium  Date Value Ref Range Status  10/14/2023 3.9 3.5 - 5.2 mmol/L Final         Passed - Na in normal range and within 180 days    Sodium  Date Value Ref Range Status  10/14/2023 142 134 - 144 mmol/L Final         Passed - Valid encounter within last 6 months    Recent Outpatient Visits           2 days ago Leg swelling   [place] [facility] [place], Megan P, DO   1 month ago Type 2 diabetes mellitus with diabetic neuropathy, without long-term current use of insulin  (HCC)   [place] [facility] [place], [place], DO   3 months ago Itching   [place] [facility] [place], [place], DO   4 months ago Itching   [place] [facility] [place], Megan P, DO   4 months ago Routine general medical examination at a health care facility   [facility] Vicci Duwaine SQUIBB, DO       Future Appointments             In 2 weeks Raulkar, Sven SQUIBB, MD [facility] Physical Medicine and Rehabilitation, CPR

## 2024-02-17 ENCOUNTER — Other Ambulatory Visit: Payer: Self-pay | Admitting: Family Medicine

## 2024-02-19 NOTE — Telephone Encounter (Signed)
 Requested Prescriptions  Pending Prescriptions Disp Refills   gabapentin  (NEURONTIN ) 100 MG capsule [Pharmacy Med Name: GABAPENTIN  100MG  CAPSULES] 180 capsule 0    Sig: TAKE 2 CAPSULES(200 MG) BY MOUTH EVERY MORNING     Neurology: Anticonvulsants - gabapentin  Passed - 02/19/2024  8:25 AM      Passed - Cr in normal range and within 360 days    Creatinine, Ser  Date Value Ref Range Status  10/14/2023 0.64 0.57 - 1.00 mg/dL Final         Passed - Completed PHQ-2 or PHQ-9 in the last 360 days      Passed - Valid encounter within last 12 months    Recent Outpatient Visits           1 week ago Leg swelling   [place] [facility] [place], Megan P, DO   1 month ago Type 2 diabetes mellitus with diabetic neuropathy, without long-term current use of insulin  [facility])   [facility] [place], Megan P, DO   3 months ago Itching   [place] [facility] [place], [place], DO   4 months ago Itching   [facility] [place], Megan P, DO   5 months ago Routine general medical examination at a health care facility   [facility] Vicci Duwaine SQUIBB, DO       Future Appointments             In 1 week Raulkar, Sven SQUIBB, MD [facility] Physical Medicine and Rehabilitation, CPR

## 2024-02-24 ENCOUNTER — Encounter (INDEPENDENT_AMBULATORY_CARE_PROVIDER_SITE_OTHER): Payer: Self-pay | Admitting: Vascular Surgery

## 2024-02-24 ENCOUNTER — Ambulatory Visit (INDEPENDENT_AMBULATORY_CARE_PROVIDER_SITE_OTHER): Admitting: Vascular Surgery

## 2024-02-24 VITALS — BP 112/74 | HR 85 | Resp 18 | Ht 62.0 in | Wt 159.0 lb

## 2024-02-24 DIAGNOSIS — E785 Hyperlipidemia, unspecified: Secondary | ICD-10-CM | POA: Diagnosis not present

## 2024-02-24 DIAGNOSIS — M51362 Other intervertebral disc degeneration, lumbar region with discogenic back pain and lower extremity pain: Secondary | ICD-10-CM

## 2024-02-24 DIAGNOSIS — E1140 Type 2 diabetes mellitus with diabetic neuropathy, unspecified: Secondary | ICD-10-CM

## 2024-02-24 DIAGNOSIS — I10 Essential (primary) hypertension: Secondary | ICD-10-CM | POA: Diagnosis not present

## 2024-02-24 DIAGNOSIS — I890 Lymphedema, not elsewhere classified: Secondary | ICD-10-CM | POA: Diagnosis not present

## 2024-02-24 NOTE — Progress Notes (Signed)
 MRN : [id number]  Lisa Galvan is a 63 y.o. ([birth year]) female who presents with chief complaint of legs swell.  History of Present Illness: The patient presents to the office for evaluation of DVT.  DVT was identified in the past..    The initial symptoms were pain and swelling in the left lower extremity.  The patient notes the affected leg is painful with dependency and swells.  Symptoms are much better with elevation.  The patient notes minimal edema in the morning which steadily worsens throughout the day.    The patient has not been using compression therapy at this point.  The patient is taking Eliquis .  No SOB or pleuritic chest pains.  No cough or hemoptysis.  No blood per rectum or blood in any sputum.  No excessive bruising per the patient.  Patient does have weakness of both lower extremities and so does not ambulate very far.  No rest pain symptoms.   No outpatient medications have been marked as taking for the 02/24/24 encounter (Appointment) with Jama, Cordella MATSU, MD.    Past Medical History:  Diagnosis Date   Allergy    Anemia    Arthritis    spine   Bilateral leg edema    Chest pain 12/15/2014   Chronic sciatica 12/10/2014   COVID-19    2023   DDD (degenerative disc disease), lumbar    Essential hypertension 12/10/2014   Gastroesophageal reflux disease without esophagitis    Mixed hyperlipidemia    Neuropathy    Obesity (BMI 30.0-34.9) 12/10/2014   Pneumonia 1968   Sleep apnea    new cpap   Type 2 diabetes mellitus without complication 12/10/2014   Umbilical hernia     Past Surgical History:  Procedure Laterality Date   ABDOMINAL HYSTERECTOMY     ANKLE CLOSED REDUCTION Right 04/15/2022   Procedure: CLOSED REDUCTION ANKLE;  Surgeon: Silva Juliene SAUNDERS, DPM;  Location: [facility];  Service: Podiatry;  Laterality: Right;   CARPAL TUNNEL RELEASE Right 11/07/2022   Procedure: CARPAL TUNNEL RELEASE ENDOSCOPIC;  Surgeon:  Edie Norleen PARAS, MD;  Location: [facility];  Service: Orthopedics;  Laterality: Right;  3rd case   CARPAL TUNNEL RELEASE Left 02/07/2023   Procedure: CARPAL TUNNEL RELEASE ENDOSCOPIC;  Surgeon: Edie Norleen PARAS, MD;  Location: [facility];  Service: Orthopedics;  Laterality: Left;  3rd case   COLONOSCOPY WITH PROPOFOL  N/A 03/30/2019   Procedure: COLONOSCOPY WITH PROPOFOL ;  Surgeon: Unk Corinn Skiff, MD;  Location: [facility];  Service: Gastroenterology;  Laterality: N/A;   ESOPHAGOGASTRODUODENOSCOPY (EGD) WITH PROPOFOL  N/A 03/12/2017   Procedure: ESOPHAGOGASTRODUODENOSCOPY (EGD) WITH PROPOFOL ;  Surgeon: Unk Corinn Skiff, MD;  Location: [facility];  Service: Gastroenterology;  Laterality: N/A;   ESOPHAGOGASTRODUODENOSCOPY (EGD) WITH PROPOFOL  N/A 03/30/2019   Procedure: ESOPHAGOGASTRODUODENOSCOPY (EGD) WITH PROPOFOL ;  Surgeon: Unk Corinn Skiff, MD;  Location: [facility];  Service: Gastroenterology;  Laterality: N/A;   HERNIA REPAIR     INCISIONAL HERNIA REPAIR N/A 11/29/2020   Procedure: HERNIA REPAIR INCISIONAL, open;  Surgeon: Jordis Laneta FALCON, MD;  Location: [facility];  Service: General;  Laterality: N/A;   ORIF ANKLE FRACTURE Left 07/30/2019   Procedure: OPEN REDUCTION INTERNAL FIXATION (ORIF) OF LEFT BIMALLEOLAR ANKLE FRACTURE;  Surgeon: Tobie Priest, MD;  Location: [facility];  Service: Orthopedics;  Laterality: Left;  Diabetic - oral  meds   ORIF ANKLE FRACTURE Right 05/03/2022   Procedure: OPEN REDUCTION INTERNAL FIXATION (ORIF) ANKLE FRACTURE - TRIMALLEOLAR WITH INTERNAL FIXATION;  Surgeon: Lennie Barter, DPM;  Location: [facility];  Service: Podiatry;  Laterality: Right;   SUPRACERVICAL ABDOMINAL HYSTERECTOMY     UMBILICAL HERNIA REPAIR N/A 05/01/2017   Procedure: HERNIA REPAIR UMBILICAL ADULT;  Surgeon: Nicholaus Selinda Birmingham, MD;  Location: [facility];  Service: General;  Laterality: N/A;   XI ROBOTIC ASSISTED VENTRAL HERNIA N/A 10/06/2019   Procedure: XI ROBOTIC ASSISTED VENTRAL  HERNIA;  Surgeon: Jordis Laneta FALCON, MD;  Location: [facility];  Service: General;  Laterality: N/A;    Social History Social History   Tobacco Use   Smoking status: Never   Smokeless tobacco: Never  Vaping Use   Vaping status: Never Used  Substance Use Topics   Alcohol use: Yes    Alcohol/week: 5.0 standard drinks of alcohol    Types: 5 Glasses of wine per week   Drug use: No    Family History Family History  Problem Relation Age of Onset   Diabetes Mother    Cancer Mother        breast and stomach   Breast cancer Mother 67   Dementia Mother    Heart disease Father    Kidney disease Father        dialysis   Cancer Father        colon   Diabetes Father    Hypertension Sister    Hypertension Daughter    Breast cancer Maternal Aunt    Congestive Heart Failure Maternal Grandmother    Diabetes Maternal Grandmother    Hypertension Maternal Grandmother    Hypertension Brother     Allergies  Allergen Reactions   Benazepril Swelling    Face and mouth swelling.   Ivp Dye [Iodinated Contrast Media] Anaphylaxis    Patient unsure of reaction but in case of reaction d/t shellfish allergy   Lidoderm  [Lidocaine ] Rash    02-01-2021: Patient had lidocaine  patch applied to chest at sight of injury and she experienced a rash, whelping, and swelling. Resolved after removing the patch. Likely to be the adhesive as she can tolerate injectable lidocaine  w/o problems.   Lyrica  [Pregabalin ] Swelling    Face/ mouth swelled up balloon   Shellfish Allergy Anaphylaxis and Swelling    Betadine on skin is okay   Shellfish Protein-Containing Drug Products Anaphylaxis   Trulicity  [Dulaglutide ] Swelling    Face and mouth swelled up   Ace Inhibitors Swelling   Gabapentin  Other (See Comments)    Severe confusion and weakness at higher doses- required hospitalization     REVIEW OF SYSTEMS (Negative unless checked)  Constitutional: [] Weight loss  [] Fever  [] Chills Cardiac: [] Chest pain    [] Chest pressure   [] Palpitations   [] Shortness of breath when laying flat   [] Shortness of breath with exertion. Vascular:  [] Pain in legs with walking   [x] Pain in legs with standing  [] History of DVT   [] Phlebitis   [x] Swelling in legs   [] Varicose veins   [] Non-healing ulcers Pulmonary:   [] Uses home oxygen   [] Productive cough   [] Hemoptysis   [] Wheeze  [] COPD   [] Asthma Neurologic:  [] Dizziness   [] Seizures   [] History of stroke   [] History of TIA  [] Aphasia   [] Vissual changes   [] Weakness or numbness in arm   [] Weakness or numbness in leg Musculoskeletal:   [] Joint swelling   [] Joint pain   [] Low back pain  Hematologic:  [] Easy bruising  [] Easy bleeding   [] Hypercoagulable state   [] Anemic Gastrointestinal:  [] Diarrhea   [] Vomiting  [] Gastroesophageal reflux/heartburn   [] Difficulty swallowing. Genitourinary:  [] Chronic kidney disease   [] Difficult urination  [] Frequent urination   [] Blood in urine Skin:  [] Rashes   [] Ulcers  Psychological:  [] History of anxiety   []  History of major depression.  Physical Examination  There were no vitals filed for this visit. There is no height or weight on file to calculate BMI. Gen: WD/WN, NAD Head: [place]/AT, No temporalis wasting.  Ear/Nose/Throat: Hearing grossly intact, nares w/o erythema or drainage, pinna without lesions Eyes: PER, EOMI, sclera nonicteric.  Neck: Supple, no gross masses.  No JVD.  Pulmonary:  Good air movement, no audible wheezing, no use of accessory muscles.  Cardiac: RRR, precordium not hyperdynamic. Vascular:  scattered varicosities present bilaterally.  Mild venous stasis changes to the legs bilaterally.  2+ soft pitting edema left greater than right, CEAP [id number]  Vessel Right Left  Radial Palpable Palpable  DP Palpable Palpable  Gastrointestinal: soft, non-distended. No guarding/no peritoneal signs.  Musculoskeletal: M/S 5/5 throughout.  No deformity.  Neurologic: CN 2-12 intact. Pain and light touch intact in  extremities.  Symmetrical.  Speech is fluent. Motor exam as listed above. Psychiatric: Judgment intact, Mood & affect appropriate for pt's clinical situation. Dermatologic: Venous rashes no ulcers noted.  No changes consistent with cellulitis. Lymph : No lichenification or skin changes of chronic lymphedema.  CBC Lab Results  Component Value Date   WBC 6.2 10/14/2023   HGB 12.6 10/14/2023   HCT 40.3 10/14/2023   MCV 87 10/14/2023   PLT 344 10/14/2023    BMET    Component Value Date/Time   NA 142 10/14/2023 1523   K 3.9 10/14/2023 1523   CL 103 10/14/2023 1523   CO2 23 10/14/2023 1523   GLUCOSE 79 10/14/2023 1523   GLUCOSE 150 (H) 05/04/2023 1446   BUN 13 10/14/2023 1523   CREATININE 0.64 10/14/2023 1523   CALCIUM  9.8 10/14/2023 1523   GFRNONAA >60 05/04/2023 1446   GFRAA 89 02/18/2020 1314   CrCl cannot be calculated (Patient's most recent lab result is older than the maximum 21 days allowed.).  COAG Lab Results  Component Value Date   INR 1.3 (H) 04/15/2022    Radiology No results found.   Assessment/Plan 1. Lymphedema (Primary) Recommend:  I have had a long discussion with the patient regarding swelling and why it  causes symptoms.  Patient will begin wearing graduated compression on a daily basis a prescription was given. The patient will  wear the stockings first thing in the morning and removing them in the evening. The patient is instructed specifically not to sleep in the stockings.   In addition, behavioral modification will be initiated.  This will include frequent elevation, use of over the counter pain medications and exercise such as walking.  Consideration for a lymph pump will also be made based upon the effectiveness of conservative therapy.  This would help to improve the edema control and prevent sequela such as ulcers and infections   Patient should undergo duplex ultrasound of the venous system to ensure that DVT or reflux is not present.  The  patient will follow-up with me after the ultrasound.  - VAS US  LOWER EXTREMITY VENOUS REFLUX; Future  2. Hyperlipidemia, unspecified hyperlipidemia type Continue statin as ordered and reviewed, no changes at this time  3. Essential hypertension Continue antihypertensive medications as already ordered, these  medications have been reviewed and there are no changes at this time.  4. Type 2 diabetes mellitus with diabetic neuropathy, without long-term current use of insulin  (HCC) Continue hypoglycemic medications as already ordered, these medications have been reviewed and there are no changes at this time.  Hgb A1C to be monitored as already arranged by primary service  5. Degeneration of intervertebral disc of lumbar region with discogenic back pain and lower extremity pain Continue medications to treat the patient's degenerative disease as already ordered, these medications have been reviewed and there are no changes at this time.  Continued activity and therapy was stressed.    Cordella Shawl, MD  02/24/2024 8:33 AM

## 2024-03-02 DIAGNOSIS — S92025K Nondisplaced fracture of anterior process of left calcaneus, subsequent encounter for fracture with nonunion: Secondary | ICD-10-CM | POA: Diagnosis not present

## 2024-03-02 DIAGNOSIS — M25571 Pain in right ankle and joints of right foot: Secondary | ICD-10-CM | POA: Diagnosis not present

## 2024-03-02 DIAGNOSIS — M79672 Pain in left foot: Secondary | ICD-10-CM | POA: Diagnosis not present

## 2024-03-02 DIAGNOSIS — M19071 Primary osteoarthritis, right ankle and foot: Secondary | ICD-10-CM | POA: Diagnosis not present
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
## 2024-03-03 ENCOUNTER — Encounter: Attending: Physical Medicine and Rehabilitation | Admitting: Physical Medicine and Rehabilitation

## 2024-03-03 ENCOUNTER — Ambulatory Visit: Admitting: Occupational Therapy

## 2024-03-03 ENCOUNTER — Encounter: Payer: Self-pay | Admitting: Occupational Therapy

## 2024-03-03 VITALS — BP 115/77 | HR 88 | Ht 62.0 in | Wt 159.0 lb

## 2024-03-03 DIAGNOSIS — E119 Type 2 diabetes mellitus without complications: Secondary | ICD-10-CM | POA: Insufficient documentation

## 2024-03-03 DIAGNOSIS — Z794 Long term (current) use of insulin: Secondary | ICD-10-CM

## 2024-03-03 DIAGNOSIS — E1340 Other specified diabetes mellitus with diabetic neuropathy, unspecified: Secondary | ICD-10-CM | POA: Insufficient documentation

## 2024-03-03 DIAGNOSIS — M79641 Pain in right hand: Secondary | ICD-10-CM | POA: Diagnosis not present

## 2024-03-03 DIAGNOSIS — Z7409 Other reduced mobility: Secondary | ICD-10-CM | POA: Insufficient documentation

## 2024-03-03 DIAGNOSIS — M6281 Muscle weakness (generalized): Secondary | ICD-10-CM | POA: Insufficient documentation

## 2024-03-03 DIAGNOSIS — R278 Other lack of coordination: Secondary | ICD-10-CM | POA: Insufficient documentation

## 2024-03-03 DIAGNOSIS — G5603 Carpal tunnel syndrome, bilateral upper limbs: Secondary | ICD-10-CM | POA: Diagnosis not present

## 2024-03-03 MED ORDER — NALTREXONE HCL (PAIN) 1.5 MG PO CAPS: 1.0000 | ORAL_CAPSULE | Freq: Every evening | ORAL | 3 refills | Status: DC

## 2024-03-03 NOTE — Addendum Note (Signed)
 Addended by: LORILEE SVEN SQUIBB on: 03/03/2024 03:03 PM   Modules accepted: Orders

## 2024-03-03 NOTE — Patient Instructions (Signed)
 Foods that may reduce pain: 1) Ginger (especially studied for arthritis)- reduce leukotriene production to decrease inflammation 2) Blueberries- high in phytonutrients that decrease inflammation 3) Salmon- marine omega-3s reduce joint swelling and pain 4) Pumpkin seeds- reduce inflammation 5) dark chocolate- reduces inflammation 6) turmeric- reduces inflammation 7) tart cherries - reduce pain and stiffness 8) extra virgin olive oil - its compound olecanthal helps to block prostaglandins  9) chili peppers- can be eaten or applied topically via capsaicin 10) mint- helpful for headache, muscle aches, joint pain, and itching 11) garlic- reduces inflammation 12) Green tea- reduces inflammation and oxidative stress, helps with weight loss, may reduce the risk of cancer, recommend Double [employer] of Tea daily  Link to further information on diet for chronic pain: http://www.bray.[email or web address]   Diabetes: -check CBGs daily, log, and bring log to follow-up appointment -avoid sugar, bread, pasta, rice -avoid snacking -perform daily foot exam and at least annual eye exam -try to incorporate into your diet some of the following foods which are good for diabetes: 1) cinnamon- imitates effects of insulin , increasing glucose transport into cells ([place] or Vietnamese cinnamon is best, least processed) 2) nuts- can slow down the blood sugar response of carbohydrate rich foods 3) oatmeal- contains and anti-inflammatory compound avenanthramide 4) whole-milk yogurt (best types are no sugar, Greek yogurt, or goat/sheep yogurt) 5) beans- high in protein, fiber, and vitamins, low glycemic index 6) broccoli- great source of vitamin A and C 7) quinoa- higher in protein and fiber than other grains 8) spinach- high in vitamin A, fiber, and protein 9) olive oil- reduces glucose levels, LDL, and triglycerides 10) salmon- excellent amount  of omega-3-fatty acids 11) walnuts- rich in antioxidants 12) apples- high in fiber and quercetin 13) carrots- highly nutritious with low impact on blood sugar 14) eggs- improve HDL (good cholesterol), high in protein, keep you satiated 15) turmeric: improves blood sugars, cardiovascular disease, and protects kidney health 16) garlic: improves blood sugar, blood pressure, pain 17) tomatoes: highly nutritious with low impact on blood sugar

## 2024-03-03 NOTE — Progress Notes (Addendum)
 Subjective:    Patient ID: Lisa Galvan, female    DOB: 13-Feb-1961, 63 y.o.   MRN: [id number]  HPI Mrs. Lisa Galvan is s 63 year old woman who presents to establish care for neuropathy  1) Neuropathy: -she is on cymbalta  60mg  and gabapentin  -she is interested in Qutenza -this is secondary to diabetes -pain is worse in the morning -she has impaired balance from gabapentin   2) Diabetes: Well controlled last HgbA1c was 6.3  Pain Inventory Average Pain 5 Pain Right Now 5 My pain is sharp, tingling, and aching  In the last 24 hours, has pain interfered with the following? General activity 6 Relation with others 0 Enjoyment of life 4 What TIME of day is your pain at its worst? morning  and night Sleep (in general) Fair  Pain is worse with: cold weather Pain improves with: medication Relief from Meds: 6  walk with assistance use a cane how many minutes can you walk? 30 ability to climb steps?  yes do you drive?  yes  retired I need assistance with the following:  meal prep and shopping  numbness tingling trouble walking  New pt  New pt    Family History  Problem Relation Age of Onset   Diabetes Mother    Cancer Mother        breast and stomach   Breast cancer Mother 51   Dementia Mother    Heart disease Father    Kidney disease Father        dialysis   Cancer Father        colon   Diabetes Father    Hypertension Sister    Hypertension Daughter    Breast cancer Maternal Aunt    Congestive Heart Failure Maternal Grandmother    Diabetes Maternal Grandmother    Hypertension Maternal Grandmother    Hypertension Brother    Social History   Socioeconomic History   Marital status: Widowed    Spouse name: Not on file   Number of children: 2   Years of education: Not on file   Highest education level: Some college, no degree  Occupational History   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 Her daughter will provide her transportation  and will stay with patient after surgery.   Feels safe in her home.   Minimal stairs in her home.   No lifting at work.   Social Drivers of Health   Financial Resource Strain: Medium Risk (02/27/2024)   [facility]   Overall Financial Resource Strain (CARDIA)    Difficulty of Paying Living Expenses: Somewhat hard  Food Insecurity: Food Insecurity Present (02/27/2024)   Received from [facility]   Hunger Vital Sign    Within the past 12 months, you worried that your food would run out before you got the money to buy more.: Sometimes true    Within the past 12 months, the food you bought just didn't last and you didn't have money to get more.: Sometimes  true  Transportation Needs: No Transportation Needs (02/27/2024)   Received from [facility] - Transportation    In the past 12 months, has lack of transportation kept you from medical appointments or from getting medications?: No    Lack of Transportation (Non-Medical): No  Physical Activity: Sufficiently Active (07/15/2023)   Exercise Vital Sign    Days of Exercise per Week: 7 days    Minutes of Exercise per Session: 40 min  Stress: No Stress Concern Present (07/15/2023)   [employer] - Occupational Stress Questionnaire    Feeling of Stress : Only a little  Social Connections: Moderately Isolated (10/09/2023)   Social Connection and Isolation Panel    Frequency of Communication with Friends and Family: More than three times a week    Frequency of Social  Gatherings with Friends and Family: More than three times a week    Attends Religious Services: More than 4 times per year    Active Member of [employer] or Organizations: No    Attends Banker Meetings: Never    Marital Status: Widowed   Past Surgical History:  Procedure Laterality Date   ABDOMINAL HYSTERECTOMY     ANKLE CLOSED REDUCTION Right 04/15/2022   Procedure: CLOSED REDUCTION ANKLE;  Surgeon: Silva Juliene SAUNDERS, DPM;  Location: [facility];  Service: Podiatry;  Laterality: Right;   CARPAL TUNNEL RELEASE Right 11/07/2022   Procedure: CARPAL TUNNEL RELEASE ENDOSCOPIC;  Surgeon: Edie Norleen PARAS, MD;  Location: [facility];  Service: Orthopedics;  Laterality: Right;  3rd case   CARPAL TUNNEL RELEASE Left 02/07/2023   Procedure: CARPAL TUNNEL RELEASE ENDOSCOPIC;  Surgeon: Edie Norleen PARAS, MD;  Location: [facility];  Service: Orthopedics;  Laterality: Left;  3rd case   COLONOSCOPY WITH PROPOFOL  N/A 03/30/2019   Procedure: COLONOSCOPY WITH PROPOFOL ;  Surgeon: Unk Corinn Skiff, MD;  Location: [facility];  Service: Gastroenterology;  Laterality: N/A;   ESOPHAGOGASTRODUODENOSCOPY (EGD) WITH PROPOFOL  N/A 03/12/2017   Procedure: ESOPHAGOGASTRODUODENOSCOPY (EGD) WITH PROPOFOL ;  Surgeon: Unk Corinn Skiff, MD;  Location: [facility];  Service: Gastroenterology;  Laterality: N/A;   ESOPHAGOGASTRODUODENOSCOPY (EGD) WITH PROPOFOL  N/A 03/30/2019   Procedure: ESOPHAGOGASTRODUODENOSCOPY (EGD) WITH PROPOFOL ;  Surgeon: Unk Corinn Skiff, MD;  Location: [facility];  Service: Gastroenterology;  Laterality: N/A;   HERNIA REPAIR     INCISIONAL HERNIA REPAIR N/A 11/29/2020   Procedure: HERNIA REPAIR INCISIONAL, open;  Surgeon: Jordis Laneta FALCON, MD;  Location: [facility];  Service: General;  Laterality: N/A;   ORIF ANKLE FRACTURE Left 07/30/2019   Procedure: OPEN REDUCTION INTERNAL FIXATION (ORIF) OF LEFT BIMALLEOLAR ANKLE FRACTURE;  Surgeon: Tobie Priest, MD;  Location: [facility];  Service:  Orthopedics;  Laterality: Left;  Diabetic - oral meds   ORIF ANKLE FRACTURE Right 05/03/2022   Procedure: OPEN REDUCTION INTERNAL FIXATION (ORIF) ANKLE FRACTURE - TRIMALLEOLAR WITH INTERNAL FIXATION;  Surgeon: Lennie Barter, DPM;  Location: [facility];  Service: Podiatry;  Laterality: Right;   SUPRACERVICAL ABDOMINAL HYSTERECTOMY     UMBILICAL HERNIA REPAIR N/A 05/01/2017   Procedure: HERNIA REPAIR UMBILICAL ADULT;  Surgeon: Nicholaus Selinda Birmingham, MD;  Location: [facility];  Service: General;  Laterality: N/A;   XI ROBOTIC ASSISTED VENTRAL HERNIA N/A 10/06/2019   Procedure: XI ROBOTIC ASSISTED VENTRAL HERNIA;  Surgeon: Jordis Laneta FALCON, MD;  Location: [facility];  Service: General;  Laterality: N/A;   Past Medical History:  Diagnosis Date   Allergy    Anemia    Arthritis  spine   Bilateral leg edema    Chest pain 12/15/2014   Chronic sciatica 12/10/2014   COVID-19    2023   DDD (degenerative disc disease), lumbar    Essential hypertension 12/10/2014   Gastroesophageal reflux disease without esophagitis    Hand fracture, right    Mixed hyperlipidemia    Neuropathy    Obesity (BMI 30.0-34.9) 12/10/2014   Pneumonia 1968   Sleep apnea    new cpap   Type 2 diabetes mellitus without complication 12/10/2014   Umbilical hernia    BP 115/77   Pulse 88   Ht 5' 2 (1.575 m)   Wt 159 lb (72.1 kg)   SpO2 94%   BMI 29.08 kg/m   Opioid Risk Score:   Fall Risk Score:  `1  Depression screen [facility] 2/9     03/03/2024    1:26 PM 01/16/2024    3:57 PM 12/16/2023    4:56 PM 11/07/2023   12:05 PM 10/09/2023   11:09 AM 10/09/2023   10:53 AM 08/12/2023    1:34 PM  Depression screen PHQ 2/9  Decreased Interest 0 0 0 0 0 0 0  Down, Depressed, Hopeless 0 0 0 0 0 0 0  PHQ - 2 Score 0 0 0 0 0 0 0  Altered sleeping 1      0  Tired, decreased energy 0      0  Change in appetite 0      0  Feeling bad or failure about yourself  0      0  Trouble concentrating 0      0  Moving slowly or fidgety/restless 1       0  Suicidal thoughts 0      0  PHQ-9 Score 2      0   Difficult doing work/chores Somewhat difficult           Data saved with a previous flowsheet row definition     Review of Systems  Musculoskeletal:  Positive for gait problem.       B/L leg pain  Neurological:  Positive for numbness. Negative for weakness.  All other systems reviewed and are negative.      Objective:   Physical Exam  Gen: no distress, normal appearing HEENT: oral mucosa pink and moist, NCAT Cardio: Reg rate Chest: normal effort, normal rate of breathing Abd: soft, non-distended Ext: no edema Psych: pleasant, normal affect Skin: intact Neuro: Alert and oriented x3, decreased sensation in bilateral feet      Assessment & Plan:  1) Chronic Pain Syndrome secondary to diabetic peripheral neuropathy  -Discussed Qutenza as an option for neuropathic pain control. Discussed that this is a capsaicin patch, stronger than capsaicin cream. Discussed that it is currently approved for diabetic peripheral neuropathy and post-herpetic neuralgia, but that it has also shown benefit in treating other forms of neuropathy. Provided patient with link to site to learn more about the patch: https://www.clark.biz/. Discussed that the patch would be placed in office and benefits usually last 3 months. Discussed that unintended exposure to capsaicin can cause severe irritation of eyes, mucous membranes, respiratory tract, and skin, but that Qutenza is a local treatment and does not have the systemic side effects of other nerve medications. Discussed that there may be pain, itching, erythema, and decreased sensory function associated with the application of Qutenza. Side effects usually subside within 1 week. A cold pack of analgesic medications can help with these side effects. Blood  pressure can also be increased due to pain associated with administration of the patch.  We are recommending Qutenza 8% capsaicin to treat this  patient's pain. Qutenza is the first-line treatment option recommended for diabetic peripheral neuropathy by the AACE and ADA.   Qutenza is a safer option for this patient due to the following: Gabapentin  use has been associated with increased risk of dementia Lyrica  use has been associated with increased risk of heart failure Cymblata, Venlafaxine, and other SSRIs/SNRIs are associated with increased weight gain Lidocaine  5%- she has allergy  -Discussed current symptoms of pain and history of pain.  -Discussed benefits of exercise in reducing pain. -Discussed following foods that may reduce pain: 1) Ginger (especially studied for arthritis)- reduce leukotriene production to decrease inflammation 2) Blueberries- high in phytonutrients that decrease inflammation 3) Salmon- marine omega-3s reduce joint swelling and pain 4) Pumpkin seeds- reduce inflammation 5) dark chocolate- reduces inflammation 6) turmeric- reduces inflammation 7) tart cherries - reduce pain and stiffness 8) extra virgin olive oil - its compound olecanthal helps to block prostaglandins  9) chili peppers- can be eaten or applied topically via capsaicin 10) mint- helpful for headache, muscle aches, joint pain, and itching 11) garlic- reduces inflammation 12) Green tea- reduces inflammation and oxidative stress, helps with weight loss, may reduce the risk of cancer, recommend Double [employer] of Tea daily  Link to further information on diet for chronic pain: http://www.bray.com/   -discussed mechanism of action of low dose naltrexone as an opioid receptor antagonist which stimulates your body's production of its own natural endogenous opioids, helping to decrease pain. Discussed that it can also decrease T cell response and thus be helpful in decreasing inflammation, and symptoms of brain fog, fatigue, anxiety, depression, and allergies.  Discussed that this medication needs to be compounded at a compounding pharmacy and can more expensive. Discussed that I usually start at 1mg  and if this is not providing enough relief then I titrate upward on a monthly basis.     2)Diabetes: -check CBGs daily, log, and bring log to follow-up appointment -avoid sugar, bread, pasta, rice -avoid snacking -perform daily foot exam and at least annual eye exam -try to incorporate into your diet some of the following foods which are good for diabetes: 1) cinnamon- imitates effects of insulin , increasing glucose transport into cells ([place] or Vietnamese cinnamon is best, least processed) 2) nuts- can slow down the blood sugar response of carbohydrate rich foods 3) oatmeal- contains and anti-inflammatory compound avenanthramide 4) whole-milk yogurt (best types are no sugar, Greek yogurt, or goat/sheep yogurt) 5) beans- high in protein, fiber, and vitamins, low glycemic index 6) broccoli- great source of vitamin A and C 7) quinoa- higher in protein and fiber than other grains 8) spinach- high in vitamin A, fiber, and protein 9) olive oil- reduces glucose levels, LDL, and triglycerides 10) salmon- excellent amount of omega-3-fatty acids 11) walnuts- rich in antioxidants 12) apples- high in fiber and quercetin 13) carrots- highly nutritious with low impact on blood sugar 14) eggs- improve HDL (good cholesterol), high in protein, keep you satiated 15) turmeric: improves blood sugars, cardiovascular disease, and protects kidney health 16) garlic: improves blood sugar, blood pressure, pain 17) tomatoes: highly nutritious with low impact on blood sugar   3) Impaired balance: Discussed Qutenza can help with this as well -decrease gabapentin  to 100mg  daily, 400mg  HS

## 2024-03-05 ENCOUNTER — Encounter: Payer: Self-pay | Admitting: Family Medicine

## 2024-03-05 ENCOUNTER — Ambulatory Visit: Admitting: Occupational Therapy

## 2024-03-05 DIAGNOSIS — M6281 Muscle weakness (generalized): Secondary | ICD-10-CM

## 2024-03-05 DIAGNOSIS — M79641 Pain in right hand: Secondary | ICD-10-CM

## 2024-03-05 DIAGNOSIS — G5603 Carpal tunnel syndrome, bilateral upper limbs: Secondary | ICD-10-CM

## 2024-03-05 DIAGNOSIS — R278 Other lack of coordination: Secondary | ICD-10-CM
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
## 2024-03-06 ENCOUNTER — Other Ambulatory Visit (INDEPENDENT_AMBULATORY_CARE_PROVIDER_SITE_OTHER): Payer: Self-pay | Admitting: Vascular Surgery

## 2024-03-06 DIAGNOSIS — M51362 Other intervertebral disc degeneration, lumbar region with discogenic back pain and lower extremity pain: Secondary | ICD-10-CM

## 2024-03-06 DIAGNOSIS — E1140 Type 2 diabetes mellitus with diabetic neuropathy, unspecified: Secondary | ICD-10-CM

## 2024-03-06 DIAGNOSIS — M7989 Other specified soft tissue disorders: Secondary | ICD-10-CM

## 2024-03-06 DIAGNOSIS — I890 Lymphedema, not elsewhere classified: Secondary | ICD-10-CM

## 2024-03-06 DIAGNOSIS — E785 Hyperlipidemia, unspecified: Secondary | ICD-10-CM

## 2024-03-06 DIAGNOSIS — I10 Essential (primary) hypertension: Secondary | ICD-10-CM

## 2024-03-07 ENCOUNTER — Other Ambulatory Visit: Payer: Self-pay | Admitting: Family Medicine

## 2024-03-09 ENCOUNTER — Encounter (INDEPENDENT_AMBULATORY_CARE_PROVIDER_SITE_OTHER): Payer: Self-pay | Admitting: Vascular Surgery

## 2024-03-09 ENCOUNTER — Ambulatory Visit (INDEPENDENT_AMBULATORY_CARE_PROVIDER_SITE_OTHER): Admitting: Vascular Surgery

## 2024-03-09 ENCOUNTER — Ambulatory Visit (INDEPENDENT_AMBULATORY_CARE_PROVIDER_SITE_OTHER)

## 2024-03-09 VITALS — BP 107/76 | HR 94 | Resp 18 | Ht 62.0 in | Wt 159.0 lb

## 2024-03-09 DIAGNOSIS — I890 Lymphedema, not elsewhere classified: Secondary | ICD-10-CM

## 2024-03-09 DIAGNOSIS — E785 Hyperlipidemia, unspecified: Secondary | ICD-10-CM

## 2024-03-09 DIAGNOSIS — E782 Mixed hyperlipidemia: Secondary | ICD-10-CM | POA: Diagnosis not present

## 2024-03-09 DIAGNOSIS — M7989 Other specified soft tissue disorders: Secondary | ICD-10-CM | POA: Diagnosis not present

## 2024-03-09 DIAGNOSIS — I700 Atherosclerosis of aorta: Secondary | ICD-10-CM | POA: Diagnosis not present

## 2024-03-09 DIAGNOSIS — I119 Hypertensive heart disease without heart failure: Secondary | ICD-10-CM | POA: Diagnosis not present

## 2024-03-09 DIAGNOSIS — M51362 Other intervertebral disc degeneration, lumbar region with discogenic back pain and lower extremity pain: Secondary | ICD-10-CM

## 2024-03-09 DIAGNOSIS — E1140 Type 2 diabetes mellitus with diabetic neuropathy, unspecified: Secondary | ICD-10-CM

## 2024-03-09 DIAGNOSIS — I10 Essential (primary) hypertension: Secondary | ICD-10-CM | POA: Diagnosis not present

## 2024-03-09 NOTE — Progress Notes (Signed)
 MRN : [id number]  Lisa Galvan is a 63 y.o. (Aug 04, 1960) female who presents with chief complaint of legs hurt and swell.  History of Present Illness:    The patient presents to the office for evaluation of DVT.  DVT was identified in the past..     The initial symptoms were pain and swelling in the left lower extremity.   The patient notes the affected leg is painful with dependency and swells.  Symptoms are much better with elevation.  The patient notes minimal edema in the morning which steadily worsens throughout the day.     The patient has not been using compression therapy at this point.  The patient is taking Eliquis .   No SOB or pleuritic chest pains.  No cough or hemoptysis.   No blood per rectum or blood in any sputum.  No excessive bruising per the patient.   Patient does have weakness of both lower extremities and so does not ambulate very far.  No rest pain symptoms.  Duplex ultrasound of the left lower extremity venous system is negative for chronic changes of the deep venous system.  There is no evidence for superficial reflux.  Current Meds  Medication Sig   Accu-Chek Softclix Lancets lancets TEST TWICE DAILY   atorvastatin  (LIPITOR) 20 MG tablet TAKE 1 TABLET(20 MG) BY MOUTH DAILY   Cholecalciferol  (VITAMIN D3) 125 MCG (5000 UT) CAPS Take 1 capsule (5,000 Units total) by mouth daily.   DULoxetine  (CYMBALTA ) 60 MG capsule Take 1 capsule (60 mg total) by mouth daily.   ELIQUIS  5 MG TABS tablet TAKE 1 TABLET(5 MG) BY MOUTH TWICE DAILY   EPINEPHrine  0.3 mg/0.3 mL IJ SOAJ injection Inject 0.3 mg into the muscle as needed for anaphylaxis.   folic acid  (FOLVITE ) 1 MG tablet Take 1 tablet (1 mg total) by mouth daily.   gabapentin  (NEURONTIN ) 100 MG capsule TAKE 2 CAPSULES(200 MG) BY MOUTH EVERY MORNING   gabapentin  (NEURONTIN ) 400 MG capsule TAKE 1 CAPSULE(400 MG) BY MOUTH AT BEDTIME   hydrochlorothiazide  (HYDRODIURIL ) 25 MG tablet Take 1 tablet (25 mg total) by  mouth daily.   levocetirizine (XYZAL  ALLERGY 24HR) 5 MG tablet Take 1 tablet (5 mg total) by mouth every evening.   Magnesium  Oxide -Mg Supplement 500 MG TABS Take 1 tablet (500 mg total) by mouth daily.   metFORMIN  (GLUCOPHAGE ) 500 MG tablet TAKE 1 TABLET(500 MG) BY MOUTH DAILY WITH BREAKFAST   Multiple Vitamin (MULTIVITAMIN) tablet Take 1 tablet by mouth daily.   Naltrexone  HCl, Pain, 1.5 MG CAPS Take 1 tablet by mouth at bedtime.   omeprazole  (PRILOSEC) 40 MG capsule TAKE 1 CAPSULE(40 MG) BY MOUTH BID   thiamine  (VITAMIN B1) 100 MG tablet Take 1 tablet (100 mg total) by mouth daily.   vitamin B-12 (CYANOCOBALAMIN ) 1000 MCG tablet Take 1,000 mcg by mouth 2 (two) times daily.    Past Medical History:  Diagnosis Date   Allergy    Anemia    Arthritis    spine   Bilateral leg edema    Chest pain 12/15/2014   Chronic sciatica 12/10/2014   COVID-19    2023   DDD (degenerative disc disease), lumbar    Essential hypertension 12/10/2014   Gastroesophageal reflux disease without esophagitis    Hand fracture, right    Mixed hyperlipidemia    Neuropathy    Obesity (BMI 30.0-34.9) 12/10/2014   Pneumonia 1968   Sleep apnea    new cpap  Type 2 diabetes mellitus without complication 12/10/2014   Umbilical hernia     Past Surgical History:  Procedure Laterality Date   ABDOMINAL HYSTERECTOMY     ANKLE CLOSED REDUCTION Right 04/15/2022   Procedure: CLOSED REDUCTION ANKLE;  Surgeon: Silva Juliene SAUNDERS, DPM;  Location: [facility];  Service: Podiatry;  Laterality: Right;   CARPAL TUNNEL RELEASE Right 11/07/2022   Procedure: CARPAL TUNNEL RELEASE ENDOSCOPIC;  Surgeon: Edie Norleen PARAS, MD;  Location: [facility];  Service: Orthopedics;  Laterality: Right;  3rd case   CARPAL TUNNEL RELEASE Left 02/07/2023   Procedure: CARPAL TUNNEL RELEASE ENDOSCOPIC;  Surgeon: Edie Norleen PARAS, MD;  Location: [facility];  Service: Orthopedics;  Laterality: Left;  3rd case   COLONOSCOPY WITH PROPOFOL  N/A 03/30/2019    Procedure: COLONOSCOPY WITH PROPOFOL ;  Surgeon: Unk Corinn Skiff, MD;  Location: [facility];  Service: Gastroenterology;  Laterality: N/A;   ESOPHAGOGASTRODUODENOSCOPY (EGD) WITH PROPOFOL  N/A 03/12/2017   Procedure: ESOPHAGOGASTRODUODENOSCOPY (EGD) WITH PROPOFOL ;  Surgeon: Unk Corinn Skiff, MD;  Location: [facility];  Service: Gastroenterology;  Laterality: N/A;   ESOPHAGOGASTRODUODENOSCOPY (EGD) WITH PROPOFOL  N/A 03/30/2019   Procedure: ESOPHAGOGASTRODUODENOSCOPY (EGD) WITH PROPOFOL ;  Surgeon: Unk Corinn Skiff, MD;  Location: [facility];  Service: Gastroenterology;  Laterality: N/A;   HERNIA REPAIR     INCISIONAL HERNIA REPAIR N/A 11/29/2020   Procedure: HERNIA REPAIR INCISIONAL, open;  Surgeon: Jordis Laneta FALCON, MD;  Location: [facility];  Service: General;  Laterality: N/A;   ORIF ANKLE FRACTURE Left 07/30/2019   Procedure: OPEN REDUCTION INTERNAL FIXATION (ORIF) OF LEFT BIMALLEOLAR ANKLE FRACTURE;  Surgeon: Tobie Priest, MD;  Location: [facility];  Service: Orthopedics;  Laterality: Left;  Diabetic - oral meds   ORIF ANKLE FRACTURE Right 05/03/2022   Procedure: OPEN REDUCTION INTERNAL FIXATION (ORIF) ANKLE FRACTURE - TRIMALLEOLAR WITH INTERNAL FIXATION;  Surgeon: Lennie Barter, DPM;  Location: [facility];  Service: Podiatry;  Laterality: Right;   SUPRACERVICAL ABDOMINAL HYSTERECTOMY     UMBILICAL HERNIA REPAIR N/A 05/01/2017   Procedure: HERNIA REPAIR UMBILICAL ADULT;  Surgeon: Nicholaus Selinda Birmingham, MD;  Location: [facility];  Service: General;  Laterality: N/A;   XI ROBOTIC ASSISTED VENTRAL HERNIA N/A 10/06/2019   Procedure: XI ROBOTIC ASSISTED VENTRAL HERNIA;  Surgeon: Jordis Laneta FALCON, MD;  Location: [facility];  Service: General;  Laterality: N/A;    Social History Social History   Tobacco Use   Smoking status: Never   Smokeless tobacco: Never  Vaping Use   Vaping status: Never Used  Substance Use Topics   Alcohol use: Yes    Alcohol/week: 5.0 standard drinks of  alcohol    Types: 5 Glasses of wine per week   Drug use: No    Family History Family History  Problem Relation Age of Onset   Diabetes Mother    Cancer Mother        breast and stomach   Breast cancer Mother 2   Dementia Mother    Heart disease Father    Kidney disease Father        dialysis   Cancer Father        colon   Diabetes Father    Hypertension Sister    Hypertension Daughter    Breast cancer Maternal Aunt    Congestive Heart Failure Maternal Grandmother    Diabetes Maternal Grandmother    Hypertension Maternal Grandmother    Hypertension Brother     Allergies  Allergen Reactions   Benazepril Swelling    Face and  mouth swelling.   Ivp Dye [Iodinated Contrast Media] Anaphylaxis    Patient unsure of reaction but in case of reaction d/t shellfish allergy   Lidoderm  [Lidocaine ] Rash    02-01-2021: Patient had lidocaine  patch applied to chest at sight of injury and she experienced a rash, whelping, and swelling. Resolved after removing the patch. Likely to be the adhesive as she can tolerate injectable lidocaine  w/o problems.   Lyrica  [Pregabalin ] Swelling    Face/ mouth swelled up balloon   Shellfish Allergy Anaphylaxis and Swelling    Betadine on skin is okay   Shellfish Protein-Containing Drug Products Anaphylaxis   Trulicity  [Dulaglutide ] Swelling    Face and mouth swelled up   Ace Inhibitors Swelling   Gabapentin  Other (See Comments)    Severe confusion and weakness at higher doses- required hospitalization     REVIEW OF SYSTEMS (Negative unless checked)  Constitutional: [] Weight loss  [] Fever  [] Chills Cardiac: [] Chest pain   [] Chest pressure   [] Palpitations   [] Shortness of breath when laying flat   [] Shortness of breath with exertion. Vascular:  [] Pain in legs with walking   [x] Pain in legs at rest  [] History of DVT   [] Phlebitis   [x] Swelling in legs   [] Varicose veins   [] Non-healing ulcers Pulmonary:   [] Uses home oxygen   [] Productive cough    [] Hemoptysis   [] Wheeze  [] COPD   [] Asthma Neurologic:  [] Dizziness   [] Seizures   [] History of stroke   [] History of TIA  [] Aphasia   [] Vissual changes   [] Weakness or numbness in arm   [] Weakness or numbness in leg Musculoskeletal:   [] Joint swelling   [] Joint pain   [] Low back pain Hematologic:  [] Easy bruising  [] Easy bleeding   [] Hypercoagulable state   [] Anemic Gastrointestinal:  [] Diarrhea   [] Vomiting  [] Gastroesophageal reflux/heartburn   [] Difficulty swallowing. Genitourinary:  [] Chronic kidney disease   [] Difficult urination  [] Frequent urination   [] Blood in urine Skin:  [] Rashes   [] Ulcers  Psychological:  [] History of anxiety   []  History of major depression.  Physical Examination  Vitals:   03/09/24 1527  BP: 107/76  Pulse: 94  Resp: 18  Weight: 159 lb (72.1 kg)  Height: 5' 2 (1.575 m)   Body mass index is 29.08 kg/m. Gen: WD/WN, NAD Head: [place]/AT, No temporalis wasting.  Ear/Nose/Throat: Hearing grossly intact, nares w/o erythema or drainage, pinna without lesions Eyes: PER, EOMI, sclera nonicteric.  Neck: Supple, no gross masses.  No JVD.  Pulmonary:  Good air movement, no audible wheezing, no use of accessory muscles.  Cardiac: RRR, precordium not hyperdynamic. Vascular:  scattered varicosities present bilaterally.  Moderate venous stasis changes to the legs bilaterally.  4+ soft pitting edema. CEAP C4sEpAsPr   Vessel Right Left  Radial Palpable Palpable  Gastrointestinal: soft, non-distended. No guarding/no peritoneal signs.  Musculoskeletal: M/S 5/5 throughout.  No deformity.  Neurologic: CN 2-12 intact. Pain and light touch intact in extremities.  Symmetrical.  Speech is fluent. Motor exam as listed above. Psychiatric: Judgment intact, Mood & affect appropriate for pt's clinical situation. Dermatologic: Venous rashes no ulcers noted.  No changes consistent with cellulitis. Lymph : No lichenification or skin changes of chronic lymphedema.  CBC Lab Results   Component Value Date   WBC 6.2 10/14/2023   HGB 12.6 10/14/2023   HCT 40.3 10/14/2023   MCV 87 10/14/2023   PLT 344 10/14/2023    BMET    Component Value Date/Time   NA 142 10/14/2023 1523  K 3.9 10/14/2023 1523   CL 103 10/14/2023 1523   CO2 23 10/14/2023 1523   GLUCOSE 79 10/14/2023 1523   GLUCOSE 150 (H) 05/04/2023 1446   BUN 13 10/14/2023 1523   CREATININE 0.64 10/14/2023 1523   CALCIUM  9.8 10/14/2023 1523   GFRNONAA >60 05/04/2023 1446   GFRAA 89 02/18/2020 1314   CrCl cannot be calculated (Patient's most recent lab result is older than the maximum 21 days allowed.).  COAG Lab Results  Component Value Date   INR 1.3 (H) 04/15/2022    Radiology No results found.   Assessment/Plan 1. Lymphedema (Primary) Recommend:  No surgery or intervention at this point in time.   The Patient is CEAP C4sEpAsPr.  The patient has been wearing compression for more than 12 weeks with no or little benefit.  The patient has been exercising daily for more than 12 weeks. The patient has been elevating and taking OTC pain medications for more than 12 weeks.  None of these have have eliminated the pain related to the lymphedema or the discomfort regarding excessive swelling and venous congestion.    I have reviewed my discussion with the patient regarding lymphedema and why it  causes symptoms.  Patient will continue wearing graduated compression on a daily basis. The patient should put the compression on first thing in the morning and removing them in the evening. The patient should not sleep in the compression.   In addition, behavioral modification throughout the day will be continued.  This will include frequent elevation (such as in a recliner), use of over the counter pain medications as needed and exercise such as walking.  The systemic causes for chronic edema such as liver, kidney and cardiac etiologies do not appear to have significant changed over the past year.    The  patient has chronic , severe lymphedema with hyperpigmentation of the skin and has done MLD, skin care, medication, diet, exercise, elevation and compression for 4 weeks with no improvement,  I am recommending a lymphedema pump.  The patient still has stage 3 lymphedema and therefore, I believe that a lymph pump is needed to improve the control of the patient's lymphedema and improve the quality of life.  Additionally, a lymph pump is warranted because it will reduce the risk of cellulitis and ulceration in the future.  Patient should follow-up in six months   2. Essential hypertension Continue antihypertensive medications as already ordered, these medications have been reviewed and there are no changes at this time.  3. Type 2 diabetes mellitus with diabetic neuropathy, without long-term current use of insulin  (HCC) Continue hypoglycemic medications as already ordered, these medications have been reviewed and there are no changes at this time.  Hgb A1C to be monitored as already arranged by primary service  4. Degeneration of intervertebral disc of lumbar region with discogenic back pain and lower extremity pain Continue medications to treat the patient's degenerative disease as already ordered, these medications have been reviewed and there are no changes at this time.  Continued activity and therapy was stressed.  5. Hyperlipidemia, unspecified hyperlipidemia type Continue statin as ordered and reviewed, no changes at this time    Cordella Shawl, MD  03/09/2024 3:36 PM

## 2024-03-10 NOTE — Telephone Encounter (Signed)
 Requested Prescriptions  Pending Prescriptions Disp Refills   hydrochlorothiazide  (HYDRODIURIL ) 25 MG tablet [Pharmacy Med Name: HYDROCHLOROTHIAZIDE  25MG  TABLETS] 90 tablet 0    Sig: TAKE 1 TABLET(25 MG) BY MOUTH DAILY     Cardiovascular: Diuretics - Thiazide Passed - 03/10/2024 12:40 PM      Passed - Cr in normal range and within 180 days    Creatinine, Ser  Date Value Ref Range Status  10/14/2023 0.64 0.57 - 1.00 mg/dL Final         Passed - K in normal range and within 180 days    Potassium  Date Value Ref Range Status  10/14/2023 3.9 3.5 - 5.2 mmol/L Final         Passed - Na in normal range and within 180 days    Sodium  Date Value Ref Range Status  10/14/2023 142 134 - 144 mmol/L Final         Passed - Last BP in normal range    BP Readings from Last 1 Encounters:  03/09/24 107/76         Passed - Valid encounter within last 6 months    Recent Outpatient Visits           4 weeks ago Leg swelling   [place] [facility] [place], Megan P, DO   2 months ago Type 2 diabetes mellitus with diabetic neuropathy, without long-term current use of insulin  [facility])   [facility] [place], Megan P, DO   4 months ago Itching   [place] [facility] [place], [place], DO   4 months ago Itching   [place] [facility] [place], Megan P, DO   5 months ago Routine general medical examination at a health care facility   [facility] [place], [place], DO

## 2024-03-12 ENCOUNTER — Other Ambulatory Visit: Payer: Self-pay | Admitting: Internal Medicine

## 2024-03-12 DIAGNOSIS — I119 Hypertensive heart disease without heart failure: Secondary | ICD-10-CM

## 2024-03-13 ENCOUNTER — Ambulatory Visit: Admitting: Occupational Therapy

## 2024-03-13 ENCOUNTER — Telehealth: Payer: Self-pay | Admitting: Physical Medicine and Rehabilitation

## 2024-03-13 DIAGNOSIS — M6281 Muscle weakness (generalized): Secondary | ICD-10-CM | POA: Diagnosis not present

## 2024-03-13 DIAGNOSIS — M79641 Pain in right hand: Secondary | ICD-10-CM | POA: Diagnosis not present

## 2024-03-13 DIAGNOSIS — R278 Other lack of coordination: Secondary | ICD-10-CM | POA: Diagnosis not present

## 2024-03-13 DIAGNOSIS — G5603 Carpal tunnel syndrome, bilateral upper limbs: Secondary | ICD-10-CM | POA: Diagnosis not present

## 2024-03-13 NOTE — Telephone Encounter (Signed)
 Patient called stain the medication give to her on 11/12 is not very effective and would like to know if she can up her dosage to 2mg  in stead on 1 on her next fill.
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
## 2024-03-14 ENCOUNTER — Encounter: Payer: Self-pay | Admitting: Physical Medicine and Rehabilitation

## 2024-03-18 ENCOUNTER — Ambulatory Visit: Admission: RE | Admit: 2024-03-18 | Source: Ambulatory Visit

## 2024-03-23 ENCOUNTER — Other Ambulatory Visit: Payer: Self-pay | Admitting: Physical Medicine and Rehabilitation

## 2024-03-23 MED ORDER — NALTREXONE HCL (PAIN) 1.5 MG PO CAPS: 2.0000 | ORAL_CAPSULE | Freq: Every evening | ORAL | 3 refills | Status: DC

## 2024-03-25 ENCOUNTER — Encounter: Payer: Self-pay | Admitting: Family Medicine

## 2024-03-25 ENCOUNTER — Ambulatory Visit: Admitting: Family Medicine

## 2024-03-25 VITALS — BP 124/74 | HR 91 | Temp 98.0°F | Ht 62.0 in | Wt 158.0 lb

## 2024-03-25 DIAGNOSIS — Z23 Encounter for immunization: Secondary | ICD-10-CM

## 2024-03-25 DIAGNOSIS — E559 Vitamin D deficiency, unspecified: Secondary | ICD-10-CM

## 2024-03-25 DIAGNOSIS — M792 Neuralgia and neuritis, unspecified: Secondary | ICD-10-CM

## 2024-03-25 DIAGNOSIS — E538 Deficiency of other specified B group vitamins: Secondary | ICD-10-CM

## 2024-03-25 DIAGNOSIS — E1140 Type 2 diabetes mellitus with diabetic neuropathy, unspecified: Secondary | ICD-10-CM | POA: Diagnosis not present

## 2024-03-25 DIAGNOSIS — E785 Hyperlipidemia, unspecified: Secondary | ICD-10-CM | POA: Diagnosis not present

## 2024-03-25 DIAGNOSIS — I10 Essential (primary) hypertension: Secondary | ICD-10-CM

## 2024-03-25 LAB — BAYER DCA HB A1C WAIVED: HB A1C (BAYER DCA - WAIVED): 6.6 % — ABNORMAL HIGH (ref 4.8–5.6)

## 2024-03-25 MED ORDER — TRAZODONE HCL 50 MG PO TABS: 50.0000 mg | ORAL_TABLET | Freq: Every evening | ORAL | 1 refills | Status: AC | PRN

## 2024-03-25 MED ORDER — GABAPENTIN 400 MG PO CAPS: 400.0000 mg | ORAL_CAPSULE | Freq: Every day | ORAL | 1 refills | Status: AC

## 2024-03-25 MED ORDER — METFORMIN HCL 500 MG PO TABS: 500.0000 mg | ORAL_TABLET | Freq: Every day | ORAL | 1 refills | Status: AC

## 2024-03-25 MED ORDER — GABAPENTIN 100 MG PO CAPS: 200.0000 mg | ORAL_CAPSULE | Freq: Every day | ORAL | 1 refills | Status: AC

## 2024-03-25 MED ORDER — THIAMINE MONONITRATE 100 MG PO TABS: 100.0000 mg | ORAL_TABLET | Freq: Every day | ORAL | 3 refills | Status: AC

## 2024-03-25 MED ORDER — OMEPRAZOLE 40 MG PO CPDR: DELAYED_RELEASE_CAPSULE | ORAL | 1 refills | Status: AC

## 2024-03-25 MED ORDER — DULOXETINE HCL 60 MG PO CPEP: 60.0000 mg | ORAL_CAPSULE | Freq: Every day | ORAL | 1 refills | Status: AC

## 2024-03-25 MED ORDER — APIXABAN 5 MG PO TABS: 5.0000 mg | ORAL_TABLET | Freq: Two times a day (BID) | ORAL | 1 refills | Status: AC

## 2024-03-25 NOTE — Assessment & Plan Note (Signed)
 Rechecking labs today. Await results. Treat as needed.

## 2024-03-25 NOTE — Assessment & Plan Note (Signed)
 Under good control on current regimen. Continue current regimen. Continue to monitor. Call with any concerns. Refills given. Labs drawn today.

## 2024-03-25 NOTE — Assessment & Plan Note (Signed)
Stable with A1c of 6.6. Continue current regimen. Continue to monitor. Call with any concerns.

## 2024-03-25 NOTE — Assessment & Plan Note (Signed)
 Very cautious about increasing gabapentin  given severe reaction to polypharmacy resulting in hospitalization in the past. Remain on current dose for now.

## 2024-03-25 NOTE — Progress Notes (Signed)
 BP 124/74   Pulse 91   Temp 98 F (36.7 C) (Oral)   Ht 5' 2 (1.575 m)   Wt 158 lb (71.7 kg)   SpO2 97%   BMI 28.90 kg/m    Subjective:    Patient ID: Lisa Galvan, female    DOB: May 07, 1960, 63 y.o.   MRN: [id number]  HPI: Lisa Galvan is a 63 y.o. female  Chief Complaint  Patient presents with   Diabetes   HYPERTENSION  Hypertension status: better  Satisfied with current treatment? yes Duration of hypertension: chronic BP monitoring frequency:  rarely BP medication side effects:  no Medication compliance: excellent compliance Previous BP meds: HCTZ Aspirin : no Recurrent headaches: no Visual changes: no Palpitations: no Dyspnea: no Chest pain: no Lower extremity edema: no Dizzy/lightheaded: no  DIABETES Hypoglycemic episodes:no Polydipsia/polyuria: no Visual disturbance: no Chest pain: no Paresthesias: no Glucose Monitoring: yes Taking Insulin ?: no Blood Pressure Monitoring: not checking Retinal Examination: Up to Date Foot Exam: Up to Date Diabetic Education: Completed Pneumovax: Up to Date Influenza: Up to Date Aspirin : yes   Relevant past medical, surgical, family and social history reviewed and updated as indicated. Interim medical history since our last visit reviewed. Allergies and medications reviewed and updated.  Review of Systems  Constitutional: Negative.   Respiratory: Negative.    Cardiovascular: Negative.   Musculoskeletal: Negative.   Neurological: Negative.   Psychiatric/Behavioral: Negative.      Per HPI unless specifically indicated above     Objective:    BP 124/74   Pulse 91   Temp 98 F (36.7 C) (Oral)   Ht 5' 2 (1.575 m)   Wt 158 lb (71.7 kg)   SpO2 97%   BMI 28.90 kg/m   Wt Readings from Last 3 Encounters:  03/25/24 158 lb (71.7 kg)  03/09/24 159 lb (72.1 kg)  03/03/24 159 lb (72.1 kg)    Physical Exam Vitals and nursing note reviewed.  Constitutional:      General: She is not in acute distress.     Appearance: Normal appearance. She is not ill-appearing, toxic-appearing or diaphoretic.  HENT:     Head: Normocephalic and atraumatic.     Right Ear: External ear normal.     Left Ear: External ear normal.     Nose: Nose normal.     Mouth/Throat:     Mouth: Mucous membranes are moist.     Pharynx: Oropharynx is clear.  Eyes:     General: No scleral icterus.       Right eye: No discharge.        Left eye: No discharge.     Extraocular Movements: Extraocular movements intact.     Conjunctiva/sclera: Conjunctivae normal.     Pupils: Pupils are equal, round, and reactive to light.  Cardiovascular:     Rate and Rhythm: Normal rate and regular rhythm.     Pulses: Normal pulses.     Heart sounds: Normal heart sounds. No murmur heard.    No friction rub. No gallop.  Pulmonary:     Effort: Pulmonary effort is normal. No respiratory distress.     Breath sounds: Normal breath sounds. No stridor. No wheezing, rhonchi or rales.  Chest:     Chest wall: No tenderness.  Musculoskeletal:        General: Normal range of motion.     Cervical back: Normal range of motion and neck supple.  Skin:    General: Skin is warm and dry.  Capillary Refill: Capillary refill takes less than 2 seconds.     Coloration: Skin is not jaundiced or pale.     Findings: No bruising, erythema, lesion or rash.  Neurological:     General: No focal deficit present.     Mental Status: She is alert and oriented to person, place, and time. Mental status is at baseline.  Psychiatric:        Mood and Affect: Mood normal.        Behavior: Behavior normal.        Thought Content: Thought content normal.        Judgment: Judgment normal.     Results for orders placed or performed in visit on 03/25/24  Bayer DCA Hb A1c Waived   Collection Time: 03/25/24 10:42 AM  Result Value Ref Range   HB A1C (BAYER DCA - WAIVED) 6.6 (H) 4.8 - 5.6 %      Assessment & Plan:   Problem List Items Addressed This Visit        Cardiovascular and Mediastinum   Essential hypertension   Under good control on current regimen. Continue current regimen. Continue to monitor. Call with any concerns. Refills given. Labs drawn today.       Relevant Medications   apixaban  (ELIQUIS ) 5 MG TABS tablet   Other Relevant Orders   CBC with Differential/Platelet   Comprehensive metabolic panel with GFR     Endocrine   Type 2 diabetes mellitus with diabetic neuropathy, unspecified (HCC)   Stable with A1c of 6.6. Continue current regimen. Continue to monitor. Call with any concerns.       Relevant Medications   metFORMIN  (GLUCOPHAGE ) 500 MG tablet   Other Relevant Orders   CBC with Differential/Platelet   Bayer DCA Hb A1c Waived (Completed)   Comprehensive metabolic panel with GFR     Other   Neurogenic pain (Chronic)   Very cautious about increasing gabapentin  given severe reaction to polypharmacy resulting in hospitalization in the past. Remain on current dose for now.       Relevant Medications   DULoxetine  (CYMBALTA ) 60 MG capsule   Other Relevant Orders   CBC with Differential/Platelet   Comprehensive metabolic panel with GFR   Hyperlipidemia - Primary   Under good control on current regimen. Continue current regimen. Continue to monitor. Call with any concerns. Refills given. Labs drawn today.        Relevant Medications   apixaban  (ELIQUIS ) 5 MG TABS tablet   Other Relevant Orders   CBC with Differential/Platelet   Lipid Panel w/o Chol/HDL Ratio   Comprehensive metabolic panel with GFR   Vitamin D  deficiency   Rechecking labs today. Await results. Treat as needed.       Relevant Orders   CBC with Differential/Platelet   Comprehensive metabolic panel with GFR   VITAMIN D  25 Hydroxy (Vit-D Deficiency, Fractures)   B12 deficiency   Rechecking labs today. Await results. Treat as needed.       Relevant Orders   CBC with Differential/Platelet   Comprehensive metabolic panel with GFR   B12   Other  Visit Diagnoses       Need for pneumococcal 20-valent conjugate vaccination       Prevnar given today.   Relevant Orders   Pneumococcal conjugate vaccine 20-valent (Prevnar 20) (Completed)        Follow up plan: Return in about 3 months (around 06/23/2024).

## 2024-03-26 ENCOUNTER — Ambulatory Visit: Payer: Self-pay | Admitting: Family Medicine

## 2024-03-26 DIAGNOSIS — E876 Hypokalemia: Secondary | ICD-10-CM

## 2024-03-26 LAB — CBC WITH DIFFERENTIAL/PLATELET
Basophils Absolute: 0 x10E3/uL (ref 0.0–0.2)
Basos: 1 %
EOS (ABSOLUTE): 0.1 x10E3/uL (ref 0.0–0.4)
Eos: 2 %
Hematocrit: 37.7 % (ref 34.0–46.6)
Hemoglobin: 12.7 g/dL (ref 11.1–15.9)
Immature Grans (Abs): 0 x10E3/uL (ref 0.0–0.1)
Immature Granulocytes: 0 %
Lymphocytes Absolute: 2.5 x10E3/uL (ref 0.7–3.1)
Lymphs: 47 %
MCH: 29.5 pg (ref 26.6–33.0)
MCHC: 33.7 g/dL (ref 31.5–35.7)
MCV: 88 fL (ref 79–97)
Monocytes Absolute: 0.4 x10E3/uL (ref 0.1–0.9)
Monocytes: 7 %
Neutrophils Absolute: 2.2 x10E3/uL (ref 1.4–7.0)
Neutrophils: 43 %
Platelets: 248 x10E3/uL (ref 150–450)
RBC: 4.3 x10E6/uL (ref 3.77–5.28)
RDW: 15.1 % (ref 11.7–15.4)
WBC: 5.2 x10E3/uL (ref 3.4–10.8)

## 2024-03-26 LAB — COMPREHENSIVE METABOLIC PANEL WITH GFR
ALT: 20 IU/L (ref 0–32)
AST: 33 IU/L (ref 0–40)
Albumin: 4.5 g/dL (ref 3.9–4.9)
Alkaline Phosphatase: 62 IU/L (ref 49–135)
BUN/Creatinine Ratio: 5 — ABNORMAL LOW (ref 12–28)
BUN: 4 mg/dL — ABNORMAL LOW (ref 8–27)
Bilirubin Total: 0.9 mg/dL (ref 0.0–1.2)
CO2: 30 mmol/L — ABNORMAL HIGH (ref 20–29)
Calcium: 9.8 mg/dL (ref 8.7–10.3)
Chloride: 82 mmol/L — ABNORMAL LOW (ref 96–106)
Creatinine, Ser: 0.73 mg/dL (ref 0.57–1.00)
Globulin, Total: 2.1 g/dL (ref 1.5–4.5)
Glucose: 104 mg/dL — ABNORMAL HIGH (ref 70–99)
Potassium: 3 mmol/L — ABNORMAL LOW (ref 3.5–5.2)
Sodium: 133 mmol/L — ABNORMAL LOW (ref 134–144)
Total Protein: 6.6 g/dL (ref 6.0–8.5)
eGFR: 92 mL/min/1.73 (ref >59)

## 2024-03-26 LAB — VITAMIN D 25 HYDROXY (VIT D DEFICIENCY, FRACTURES): Vit D, 25-Hydroxy: 97.7 ng/mL (ref 30.0–100.0)

## 2024-03-26 LAB — LIPID PANEL W/O CHOL/HDL RATIO
Cholesterol, Total: 185 mg/dL (ref 100–199)
HDL: 144 mg/dL (ref >39)
LDL Chol Calc (NIH): 30 mg/dL (ref 0–99)
Triglycerides: 55 mg/dL (ref 0–149)
VLDL Cholesterol Cal: 11 mg/dL (ref 5–40)

## 2024-03-26 LAB — VITAMIN B12: Vitamin B-12: >2000 pg/mL — ABNORMAL HIGH (ref 232–1245)

## 2024-03-26 MED ORDER — POTASSIUM CHLORIDE CRYS ER 20 MEQ PO TBCR: 20.0000 meq | EXTENDED_RELEASE_TABLET | Freq: Every day | ORAL | 0 refills | Status: DC

## 2024-03-26 NOTE — Progress Notes (Signed)
 Appt scheduled

## 2024-03-27 ENCOUNTER — Ambulatory Visit: Admitting: Family Medicine

## 2024-03-28 ENCOUNTER — Encounter (INDEPENDENT_AMBULATORY_CARE_PROVIDER_SITE_OTHER): Payer: Self-pay | Admitting: Vascular Surgery

## 2024-03-30 ENCOUNTER — Encounter: Payer: Self-pay | Admitting: Family Medicine

## 2024-03-31 ENCOUNTER — Encounter (HOSPITAL_COMMUNITY): Payer: Self-pay

## 2024-04-01 ENCOUNTER — Ambulatory Visit

## 2024-04-01 ENCOUNTER — Ambulatory Visit
Admission: RE | Admit: 2024-04-01 | Discharge: 2024-04-01 | Payer: Self-pay | Attending: Internal Medicine | Admitting: Internal Medicine

## 2024-04-01 DIAGNOSIS — I119 Hypertensive heart disease without heart failure: Secondary | ICD-10-CM | POA: Insufficient documentation

## 2024-04-01 MED ORDER — GADOBUTROL 1 MMOL/ML IV SOLN
10.0000 mL | Freq: Once | INTRAVENOUS | Status: AC | PRN
  Administered 2024-04-01: 10 mL via INTRAVENOUS

## 2024-04-02 ENCOUNTER — Other Ambulatory Visit: Payer: Self-pay | Admitting: Internal Medicine

## 2024-04-02 DIAGNOSIS — I119 Hypertensive heart disease without heart failure: Secondary | ICD-10-CM

## 2024-04-03 ENCOUNTER — Ambulatory Visit: Payer: Self-pay | Admitting: Occupational Therapy

## 2024-04-08 ENCOUNTER — Telehealth: Payer: Self-pay | Admitting: Family Medicine

## 2024-04-08 NOTE — Telephone Encounter (Unsigned)
 Copied from CRM #[id number]. Topic: Clinical - Medication Question >> Apr 08, 2024 12:17 PM Nathanel BROCKS wrote: Reason for CRM: potassium chloride  SA (KLOR-CON  M) 20 MEQ tablet [  Pt is saying that this pill is just to big for her to swallow can you break it down into take 2 10 mg tablets instead of the 1 20mg ? Please advise,

## 2024-04-08 NOTE — Telephone Encounter (Signed)
 Routing to provider to advise.

## 2024-04-09 ENCOUNTER — Other Ambulatory Visit

## 2024-04-09 ENCOUNTER — Other Ambulatory Visit: Payer: Self-pay | Admitting: Family Medicine

## 2024-04-09 MED ORDER — POTASSIUM CHLORIDE CRYS ER 10 MEQ PO TBCR: 10.0000 meq | EXTENDED_RELEASE_TABLET | Freq: Two times a day (BID) | ORAL | 0 refills | Status: DC

## 2024-04-10 ENCOUNTER — Other Ambulatory Visit

## 2024-04-10 DIAGNOSIS — E876 Hypokalemia: Secondary | ICD-10-CM

## 2024-04-11 LAB — BASIC METABOLIC PANEL WITH GFR
BUN/Creatinine Ratio: 3 — ABNORMAL LOW (ref 12–28)
BUN: 2 mg/dL — ABNORMAL LOW (ref 8–27)
CO2: 30 mmol/L — ABNORMAL HIGH (ref 20–29)
Calcium: 9.4 mg/dL (ref 8.7–10.3)
Chloride: 86 mmol/L — ABNORMAL LOW (ref 96–106)
Creatinine, Ser: 0.75 mg/dL (ref 0.57–1.00)
Glucose: 108 mg/dL — ABNORMAL HIGH (ref 70–99)
Potassium: 2.8 mmol/L — ABNORMAL LOW (ref 3.5–5.2)
Sodium: 136 mmol/L (ref 134–144)
eGFR: 89 mL/min/1.73

## 2024-04-13 ENCOUNTER — Ambulatory Visit: Payer: Self-pay | Admitting: Family Medicine

## 2024-04-13 NOTE — Telephone Encounter (Signed)
 Duplicate request, refilled 04/09/24.  Requested Prescriptions  Pending Prescriptions Disp Refills   potassium chloride  (KLOR-CON  M) 10 MEQ tablet [Pharmacy Med Name: POTASSIUM CL MICRO ER TABS] 180 tablet     Sig: TAKE 1 TABLET(10 MEQ) BY MOUTH TWICE DAILY     Endocrinology:  Minerals - Potassium Supplementation Failed - 04/13/2024  2:38 PM      Failed - K in normal range and within 360 days    Potassium  Date Value Ref Range Status  04/10/2024 2.8 (L) 3.5 - 5.2 mmol/L Final         Passed - Cr in normal range and within 360 days    Creatinine, Ser  Date Value Ref Range Status  04/10/2024 0.75 0.57 - 1.00 mg/dL Final         Passed - Valid encounter within last 12 months    Recent Outpatient Visits           2 weeks ago Hyperlipidemia, unspecified hyperlipidemia type   [place] [facility] [place], Megan P, DO   2 months ago Leg swelling   [place] [facility] Arm[place], Megan P, DO   3 months ago Type 2 diabetes mellitus with diabetic neuropathy, without long-term current use of insulin  [facility])   [place] [facility] [place], Megan P, DO   5 months ago Itching   [place] [facility] [place], [place], DO   6 months ago Itching   [place] [facility] [place], [place], DO

## 2024-04-15 ENCOUNTER — Other Ambulatory Visit

## 2024-04-15 DIAGNOSIS — E876 Hypokalemia: Secondary | ICD-10-CM

## 2024-04-16 LAB — BASIC METABOLIC PANEL WITH GFR
BUN/Creatinine Ratio: 4 — ABNORMAL LOW (ref 12–28)
BUN: 3 mg/dL — ABNORMAL LOW (ref 8–27)
CO2: 31 mmol/L — ABNORMAL HIGH (ref 20–29)
Calcium: 9.4 mg/dL (ref 8.7–10.3)
Chloride: 85 mmol/L — ABNORMAL LOW (ref 96–106)
Creatinine, Ser: 0.72 mg/dL (ref 0.57–1.00)
Glucose: 116 mg/dL — ABNORMAL HIGH (ref 70–99)
Potassium: 3 mmol/L — ABNORMAL LOW (ref 3.5–5.2)
Sodium: 137 mmol/L (ref 134–144)
eGFR: 93 mL/min/1.73

## 2024-04-17 ENCOUNTER — Ambulatory Visit: Payer: Self-pay | Admitting: Family Medicine

## 2024-04-17 LAB — HM DIABETES EYE EXAM

## 2024-04-24 NOTE — Telephone Encounter (Signed)
 Copied from CRM [id number]. Topic: Clinical - Request for Lab/Test Order >> Apr 24, 2024  4:26 PM Myrick T wrote: Reason for CRM: patient needs to come back to have her potassium rechecked per provider note 12/26. Please f/u to schedule once orders have been placed

## 2024-04-27 ENCOUNTER — Telehealth: Payer: Self-pay

## 2024-04-27 ENCOUNTER — Other Ambulatory Visit: Payer: Self-pay | Admitting: Family Medicine

## 2024-04-27 ENCOUNTER — Other Ambulatory Visit

## 2024-04-27 DIAGNOSIS — E876 Hypokalemia: Secondary | ICD-10-CM

## 2024-04-27 DIAGNOSIS — R159 Full incontinence of feces: Secondary | ICD-10-CM

## 2024-04-27 NOTE — Telephone Encounter (Signed)
 Copied from CRM [id number]. Topic: Clinical - Request for Lab/Test Order >> Apr 27, 2024 10:46 AM Darshell M wrote: Reason for CRM: Patient needs lab order for potassium recheck. Past the requested one-week follow up time.  Patient CB# [phone]

## 2024-04-27 NOTE — Telephone Encounter (Signed)
 Lab order has been placed for the patient. Tried calling patient to schedule lab visit, no answer and no VM.   OK for E2C2 to speak with patient and schedule lab visit if she calls back.

## 2024-04-28 LAB — BASIC METABOLIC PANEL WITH GFR
BUN/Creatinine Ratio: 5 — ABNORMAL LOW (ref 12–28)
BUN: 4 mg/dL — ABNORMAL LOW (ref 8–27)
CO2: 28 mmol/L (ref 20–29)
Calcium: 9.4 mg/dL (ref 8.7–10.3)
Chloride: 89 mmol/L — ABNORMAL LOW (ref 96–106)
Creatinine, Ser: 0.77 mg/dL (ref 0.57–1.00)
Glucose: 171 mg/dL — ABNORMAL HIGH (ref 70–99)
Sodium: 136 mmol/L (ref 134–144)
eGFR: 86 mL/min/1.73

## 2024-04-30 ENCOUNTER — Encounter: Payer: Self-pay | Admitting: Family Medicine

## 2024-05-01 LAB — FECAL OCCULT BLOOD, IMMUNOCHEMICAL: Fecal Occult Bld: POSITIVE — AB

## 2024-05-03 ENCOUNTER — Other Ambulatory Visit: Payer: Self-pay | Admitting: Family Medicine

## 2024-05-04 ENCOUNTER — Ambulatory Visit: Payer: Self-pay | Admitting: Family Medicine

## 2024-05-04 DIAGNOSIS — E876 Hypokalemia: Secondary | ICD-10-CM

## 2024-05-04 DIAGNOSIS — R195 Other fecal abnormalities: Secondary | ICD-10-CM

## 2024-05-04 NOTE — Telephone Encounter (Signed)
 Too soon for refill.  Requested Prescriptions  Pending Prescriptions Disp Refills   ELIQUIS  5 MG TABS tablet [Pharmacy Med Name: ELIQUIS  5MG  TABLETS] 60 tablet     Sig: TAKE 1 TABLET(5 MG) BY MOUTH TWICE DAILY     Hematology:  Anticoagulants - apixaban  Passed - 05/04/2024  4:28 PM      Passed - PLT in normal range and within 360 days    Platelets  Date Value Ref Range Status  03/25/2024 248 150 - 450 x10E3/uL Final         Passed - HGB in normal range and within 360 days    Hemoglobin  Date Value Ref Range Status  03/25/2024 12.7 11.1 - 15.9 g/dL Final         Passed - HCT in normal range and within 360 days    Hematocrit  Date Value Ref Range Status  03/25/2024 37.7 34.0 - 46.6 % Final         Passed - Cr in normal range and within 360 days    Creatinine, Ser  Date Value Ref Range Status  04/27/2024 0.77 0.57 - 1.00 mg/dL Final         Passed - AST in normal range and within 360 days    AST  Date Value Ref Range Status  03/25/2024 33 0 - 40 IU/L Final         Passed - ALT in normal range and within 360 days    ALT  Date Value Ref Range Status  03/25/2024 20 0 - 32 IU/L Final         Passed - Valid encounter within last 12 months    Recent Outpatient Visits           1 month ago Hyperlipidemia, unspecified hyperlipidemia type   [place] [facility] [place], Megan P, DO   2 months ago Leg swelling   [place] [facility] [place], Megan P, DO   4 months ago Type 2 diabetes mellitus with diabetic neuropathy, without long-term current use of insulin  [facility])   [place] [facility] [place], Megan P, DO   5 months ago Itching   [place] [facility] [place], [place], DO   6 months ago Itching   [place] [facility] [place], [place], DO

## 2024-05-05 ENCOUNTER — Other Ambulatory Visit

## 2024-05-05 DIAGNOSIS — R195 Other fecal abnormalities: Secondary | ICD-10-CM

## 2024-05-05 DIAGNOSIS — E876 Hypokalemia: Secondary | ICD-10-CM

## 2024-05-06 ENCOUNTER — Ambulatory Visit: Payer: Self-pay | Admitting: Family Medicine

## 2024-05-06 ENCOUNTER — Other Ambulatory Visit: Payer: Self-pay | Admitting: Family Medicine

## 2024-05-06 DIAGNOSIS — E876 Hypokalemia: Secondary | ICD-10-CM

## 2024-05-06 DIAGNOSIS — R195 Other fecal abnormalities: Secondary | ICD-10-CM

## 2024-05-06 LAB — BASIC METABOLIC PANEL WITH GFR
BUN/Creatinine Ratio: 6 — ABNORMAL LOW (ref 12–28)
BUN: 4 mg/dL — ABNORMAL LOW (ref 8–27)
CO2: 24 mmol/L (ref 20–29)
Calcium: 9.2 mg/dL (ref 8.7–10.3)
Chloride: 86 mmol/L — ABNORMAL LOW (ref 96–106)
Creatinine, Ser: 0.65 mg/dL (ref 0.57–1.00)
Glucose: 234 mg/dL — ABNORMAL HIGH (ref 70–99)
Potassium: 2.9 mmol/L — ABNORMAL LOW (ref 3.5–5.2)
Sodium: 134 mmol/L (ref 134–144)
eGFR: 98 mL/min/1.73

## 2024-05-06 MED ORDER — POTASSIUM CHLORIDE CRYS ER 10 MEQ PO TBCR: 10.0000 meq | EXTENDED_RELEASE_TABLET | Freq: Three times a day (TID) | ORAL | 0 refills | Status: DC

## 2024-05-07 ENCOUNTER — Encounter: Admitting: Physical Medicine and Rehabilitation

## 2024-05-07 NOTE — Telephone Encounter (Signed)
 Rx 05/06/24 #90- will need f/u labs Requested Prescriptions  Pending Prescriptions Disp Refills   potassium chloride  (KLOR-CON  M) 10 MEQ tablet [Pharmacy Med Name: POTASSIUM CL MICRO ER TABS] 270 tablet     Sig: TAKE 1 TABLET(10 MEQ) BY MOUTH THREE TIMES DAILY     Endocrinology:  Minerals - Potassium Supplementation Failed - 05/07/2024 11:34 AM      Failed - K in normal range and within 360 days    Potassium  Date Value Ref Range Status  05/05/2024 2.9 (L) 3.5 - 5.2 mmol/L Final         Passed - Cr in normal range and within 360 days    Creatinine, Ser  Date Value Ref Range Status  05/05/2024 0.65 0.57 - 1.00 mg/dL Final         Passed - Valid encounter within last 12 months    Recent Outpatient Visits           1 month ago Hyperlipidemia, unspecified hyperlipidemia type   [place] [facility] [place], Megan P, DO   2 months ago Leg swelling   [place] [facility] [place], Megan P, DO   4 months ago Type 2 diabetes mellitus with diabetic neuropathy, without long-term current use of insulin  [facility])   [place] [facility] [place], Megan P, DO   6 months ago Itching   [place] [facility] [place], [place], DO   6 months ago Itching   [place] [facility] [place], Aldie, DO

## 2024-05-07 NOTE — Telephone Encounter (Signed)
 Copied from CRM [id number]. Topic: Clinical - Lab/Test Results >> May 07, 2024  1:06 PM Montie POUR wrote: Reason for CRM:  I read Reena her lab results. She has no questions. She wants 10 MG tablets for potassium

## 2024-05-11 ENCOUNTER — Other Ambulatory Visit

## 2024-05-11 ENCOUNTER — Telehealth: Payer: Self-pay

## 2024-05-11 DIAGNOSIS — I824Y9 Acute embolism and thrombosis of unspecified deep veins of unspecified proximal lower extremity: Secondary | ICD-10-CM

## 2024-05-11 DIAGNOSIS — E876 Hypokalemia: Secondary | ICD-10-CM

## 2024-05-11 NOTE — Telephone Encounter (Signed)
 I will refer to pharmacy to help. She has medicare so we can't give her a coupon

## 2024-05-11 NOTE — Telephone Encounter (Signed)
 Routing to provider to advise. Is there a coupon card we could give the patient for this?

## 2024-05-11 NOTE — Telephone Encounter (Signed)
 Copied from CRM [id number]. Topic: Clinical - Medication Question >> May 11, 2024  7:36 AM Donna BRAVO wrote: Reason for CRM: The cost of apixaban  (ELIQUIS ) 5 MG TABS tablet is $200 and would like to know if there is coupon or a different medication to take. >> May 11, 2024  7:55 AM Donna BRAVO wrote: This was routed incorrectly . Routing to correct [employer]

## 2024-05-12 ENCOUNTER — Ambulatory Visit: Payer: Self-pay | Admitting: Family Medicine

## 2024-05-12 DIAGNOSIS — E876 Hypokalemia: Secondary | ICD-10-CM

## 2024-05-12 LAB — FECAL OCCULT BLOOD, IMMUNOCHEMICAL: Fecal Occult Bld: POSITIVE — AB

## 2024-05-12 LAB — BASIC METABOLIC PANEL WITH GFR
BUN/Creatinine Ratio: 6 — ABNORMAL LOW (ref 12–28)
BUN: 4 mg/dL — ABNORMAL LOW (ref 8–27)
CO2: 32 mmol/L — ABNORMAL HIGH (ref 20–29)
Calcium: 9.6 mg/dL (ref 8.7–10.3)
Chloride: 86 mmol/L — ABNORMAL LOW (ref 96–106)
Creatinine, Ser: 0.69 mg/dL (ref 0.57–1.00)
Glucose: 100 mg/dL — ABNORMAL HIGH (ref 70–99)
Potassium: 3.3 mmol/L — ABNORMAL LOW (ref 3.5–5.2)
Sodium: 137 mmol/L (ref 134–144)
eGFR: 96 mL/min/1.73

## 2024-05-12 MED ORDER — POTASSIUM CHLORIDE CRYS ER 10 MEQ PO TBCR: 20.0000 meq | EXTENDED_RELEASE_TABLET | Freq: Two times a day (BID) | ORAL | 1 refills | Status: AC

## 2024-05-12 NOTE — Progress Notes (Signed)
 Lab appt scheduled.

## 2024-05-13 ENCOUNTER — Telehealth: Payer: Self-pay

## 2024-05-13 NOTE — Progress Notes (Signed)
 Care Guide Pharmacy Note  05/13/2024 Name: Lisa Galvan MRN: [id number] DOB: 04-Aug-1960  Referred By: Vicci Duwaine SQUIBB, DO Reason for referral: Complex Care Management (Outreach to schedule with Pharm d )   Lisa Galvan is a 64 y.o. year old female who is a primary care patient of Vicci Duwaine SQUIBB, DO.  Lisa Galvan was referred to the pharmacist for assistance related to: DVT  Successful contact was made with the patient to discuss pharmacy services including being ready for the pharmacist to call at least 5 minutes before the scheduled appointment time and to have medication bottles and any blood pressure readings ready for review. The patient agreed to meet with the pharmacist via telephone visit on (date/time).06/01/2024  Jeoffrey Buffalo , RMA     [place]  [facility] Guide  Direct Dial: [phone]  Website: [email or web address]

## 2024-05-16 NOTE — Addendum Note (Signed)
 Addended by: VICCI DUWAINE SQUIBB on: 05/16/2024 09:05 PM   Modules accepted: Orders

## 2024-05-19 MED ORDER — APIXABAN 5 MG PO TABS: 5.0000 mg | ORAL_TABLET | Freq: Two times a day (BID) | ORAL | Status: AC

## 2024-05-19 NOTE — Telephone Encounter (Signed)
 OK to give samples if we have any.

## 2024-05-19 NOTE — Addendum Note (Signed)
 Addended by: JANEICE IZETTA HERO on: 05/19/2024 03:29 PM   Modules accepted: Orders

## 2024-05-20 ENCOUNTER — Other Ambulatory Visit

## 2024-05-20 DIAGNOSIS — E876 Hypokalemia: Secondary | ICD-10-CM

## 2024-05-21 ENCOUNTER — Emergency Department
Admission: EM | Admit: 2024-05-21 | Discharge: 2024-05-21 | Disposition: A | Discharge status: Home / Self Care | Attending: Emergency Medicine | Admitting: Emergency Medicine

## 2024-05-21 ENCOUNTER — Ambulatory Visit: Payer: Self-pay | Admitting: Family Medicine

## 2024-05-21 ENCOUNTER — Encounter: Attending: Physical Medicine and Rehabilitation | Admitting: Physical Medicine and Rehabilitation

## 2024-05-21 ENCOUNTER — Other Ambulatory Visit: Payer: Self-pay

## 2024-05-21 VITALS — BP 101/75 | HR 95 | Ht 62.0 in | Wt 153.0 lb

## 2024-05-21 DIAGNOSIS — G4701 Insomnia due to medical condition: Secondary | ICD-10-CM | POA: Diagnosis present

## 2024-05-21 DIAGNOSIS — E119 Type 2 diabetes mellitus without complications: Secondary | ICD-10-CM | POA: Diagnosis not present

## 2024-05-21 DIAGNOSIS — E876 Hypokalemia: Secondary | ICD-10-CM | POA: Diagnosis present

## 2024-05-21 DIAGNOSIS — E1340 Other specified diabetes mellitus with diabetic neuropathy, unspecified: Secondary | ICD-10-CM | POA: Diagnosis present

## 2024-05-21 LAB — BASIC METABOLIC PANEL WITH GFR
BUN/Creatinine Ratio: 1 — ABNORMAL LOW (ref 12–28)
BUN: 1 mg/dL — CL (ref 8–27)
CO2: 27 mmol/L (ref 20–29)
Calcium: 9.2 mg/dL (ref 8.7–10.3)
Chloride: 84 mmol/L — ABNORMAL LOW (ref 96–106)
Creatinine, Ser: 0.72 mg/dL (ref 0.57–1.00)
Glucose: 108 mg/dL — ABNORMAL HIGH (ref 70–99)
Potassium: 2.8 mmol/L — ABNORMAL LOW (ref 3.5–5.2)
Sodium: 134 mmol/L (ref 134–144)
eGFR: 93 mL/min/{1.73_m2}

## 2024-05-21 LAB — COMPREHENSIVE METABOLIC PANEL WITH GFR
ALT: 44 U/L (ref 0–44)
AST: 84 U/L — ABNORMAL HIGH (ref 15–41)
Albumin: 4.3 g/dL (ref 3.5–5.0)
Alkaline Phosphatase: 101 U/L (ref 38–126)
Anion gap: 17 — ABNORMAL HIGH (ref 5–15)
BUN: <5 mg/dL — ABNORMAL LOW (ref 8–23)
CO2: 28 mmol/L (ref 22–32)
Calcium: 9 mg/dL (ref 8.9–10.3)
Chloride: 84 mmol/L — ABNORMAL LOW (ref 98–111)
Creatinine, Ser: 0.65 mg/dL (ref 0.44–1.00)
GFR, Estimated: >60 mL/min
Glucose, Bld: 286 mg/dL — ABNORMAL HIGH (ref 70–99)
Potassium: 2.7 mmol/L — CL (ref 3.5–5.1)
Sodium: 129 mmol/L — ABNORMAL LOW (ref 135–145)
Total Bilirubin: 0.8 mg/dL (ref 0.0–1.2)
Total Protein: 6.6 g/dL (ref 6.5–8.1)

## 2024-05-21 LAB — CBC WITH DIFFERENTIAL/PLATELET
Abs Immature Granulocytes: 0.01 10*3/uL (ref 0.00–0.07)
Basophils Absolute: 0 10*3/uL (ref 0.0–0.1)
Basophils Relative: 1 %
Eosinophils Absolute: 0.1 10*3/uL (ref 0.0–0.5)
Eosinophils Relative: 1 %
HCT: 36.4 % (ref 36.0–46.0)
Hemoglobin: 12.8 g/dL (ref 12.0–15.0)
Immature Granulocytes: 0 %
Lymphocytes Relative: 55 %
Lymphs Abs: 2.4 10*3/uL (ref 0.7–4.0)
MCH: 30.5 pg (ref 26.0–34.0)
MCHC: 35.2 g/dL (ref 30.0–36.0)
MCV: 86.9 fL (ref 80.0–100.0)
Monocytes Absolute: 0.3 10*3/uL (ref 0.1–1.0)
Monocytes Relative: 8 %
Neutro Abs: 1.5 10*3/uL — ABNORMAL LOW (ref 1.7–7.7)
Neutrophils Relative %: 35 %
Platelets: 190 10*3/uL (ref 150–400)
RBC: 4.19 MIL/uL (ref 3.87–5.11)
RDW: 15 % (ref 11.5–15.5)
WBC: 4.3 10*3/uL (ref 4.0–10.5)
nRBC: 0 % (ref 0.0–0.2)

## 2024-05-21 MED ORDER — NALTREXONE HCL (PAIN) 1.5 MG PO CAPS: 3.0000 | ORAL_CAPSULE | Freq: Every evening | ORAL | 3 refills | Status: AC

## 2024-05-21 MED ORDER — POTASSIUM CHLORIDE 20 MEQ PO PACK
40.0000 meq | PACK | Freq: Once | ORAL | Status: AC
  Administered 2024-05-21: 40 meq via ORAL
  Filled 2024-05-21: qty 2

## 2024-05-21 NOTE — ED Notes (Signed)
 EDP at bedside

## 2024-05-21 NOTE — ED Triage Notes (Signed)
 Pt reports has been monitoring potassium with her pcp and today was told it was low. Pt denies cp or sob today but does endorse some intermittent episodes over the last couple weeks. Pt alert and oriented in triage. Breathing unlabored and speaking in full sentences. Symmetric chest rise and fall.   Pt K+ 2.8 per her mychart.

## 2024-05-21 NOTE — ED Notes (Signed)
 PT given discharge paperwork with all questions answered to pt's satisfaction.

## 2024-05-21 NOTE — Telephone Encounter (Signed)
 Attempted to call patient twice to advise of providers message, no answer. Left a voicemail for patient to give our office a call back. I will attempt to try her again a little later.

## 2024-05-21 NOTE — Telephone Encounter (Unsigned)
 Copied from CRM #[id number]. Topic: Clinical - Lab/Test Results >> May 21, 2024  2:00 PM Lisa Galvan wrote: Reason for CRM: gave patient lab results and gave info for her to go to the ER

## 2024-05-21 NOTE — Progress Notes (Addendum)
 "  Subjective:    Patient ID: Lisa Galvan, female    DOB: 11/11/1960, 64 y.o.   MRN: [id number]  HPI Lisa Galvan is s 64 year old woman who presents to establish care for neuropathy  1) Neuropathy: -still has a lot of numbness -she is upset that she cannot get Qutenza today -the LDN is helping, she would like an increased dose -at night her pain is worst -she is on cymbalta  60mg  and gabapentin  -she is interested in Qutenza -this is secondary to diabetes -pain is worse in the morning -she has impaired balance from gabapentin   2) Diabetes: Well controlled last HgbA1c was 6.3 -reviewed and previously 7.2  3) insomnia: -she has poor sleep due to her pain -the LDN is helping  Pain Inventory Average Pain 5 Pain Right Now 5 My pain is sharp, tingling, and aching  In the last 24 hours, has pain interfered with the following? General activity 6 Relation with others 0 Enjoyment of life 4 What TIME of day is your pain at its worst? morning  and night Sleep (in general) Fair  Pain is worse with: cold weather Pain improves with: medication Relief from Meds: 6  walk with assistance use a cane how many minutes can you walk? 30 ability to climb steps?  yes do you drive?  yes     Family History  Problem Relation Age of Onset   Diabetes Mother    Cancer Mother        breast and stomach   Breast cancer Mother 32   Dementia Mother    Heart disease Father    Kidney disease Father        dialysis   Cancer Father        colon   Diabetes Father    Hypertension Sister    Hypertension Daughter    Breast cancer Maternal Aunt    Congestive Heart Failure Maternal Grandmother    Diabetes Maternal Grandmother    Hypertension Maternal Grandmother    Hypertension Brother    Social History   Socioeconomic History   Marital status: Widowed    Spouse name: Not on file   Number of children: 2   Years of education: Not on file   Highest education level: Some college, no degree   Occupational History   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 Her daughter will provide her transportation  and will stay with patient after surgery.   Feels safe in her home.   Minimal stairs in her home.   No lifting at work.   Social Drivers of Health   Tobacco Use: Low Risk (03/28/2024)   Patient History    Smoking Tobacco Use: Never    Smokeless Tobacco Use: Never    Passive Exposure: Not on file  Financial Resource Strain: Low Risk  (03/25/2024)   Received from [facility]   Overall Financial Resource Strain (CARDIA)    Difficulty of Paying Living Expenses: Not hard at all  Recent Concern: Physicist, Medical Strain -  Medium Risk (02/27/2024)   Received from [facility]   Overall Financial Resource Strain (CARDIA)    Difficulty of Paying Living Expenses: Somewhat hard  Food Insecurity: No Food Insecurity (03/25/2024)   Received from [facility]   Epic    Within the past 12 months, you worried that your food would run out before you got the money to buy more.: Never true    Within the past 12 months, the food you bought just didn't last and you didn't have money to get more.: Never true  Recent Concern: Food Insecurity - Food Insecurity Present (02/27/2024)   Received from [facility]   Epic    Within the past 12 months, you worried that your food would run out before you got the money to buy more.: Sometimes true    Within the past 12 months, the food you bought just didn't last and you didn't have money to  get more.: Sometimes true  Transportation Needs: No Transportation Needs (03/25/2024)   Received from [facility] - [facility]    In the past 12 months, has lack of transportation kept you from medical appointments or from getting medications?: No    Lack of Transportation (Non-Medical): No  Physical Activity: Sufficiently Active (07/15/2023)   Exercise Vital Sign    Days of Exercise per Week: 7 days    Minutes of Exercise per Session: 40 min  Stress: No Stress Concern Present (07/15/2023)   [employer] of [employer] - Occupational Stress Questionnaire    Feeling of Stress : Only a little  Social Connections: Moderately Isolated (10/09/2023)   Social Connection and Isolation Panel    Frequency of Communication with Friends and Family: More than three times a week    Frequency of Social Gatherings with Friends and Family: More than three times a week    Attends Religious Services: More than 4 times per year    Active Member of [employer] or Organizations: No    Attends Banker Meetings: Never    Marital Status: Widowed  Depression (PHQ2-9): Low Risk (03/03/2024)   Depression (PHQ2-9)    PHQ-2 Score: 2  Alcohol Screen: Low Risk (12/21/2021)   Alcohol Screen    Last Alcohol Screening Score (AUDIT): 0  Housing: Low Risk  (04/27/2024)   Received from [facility]   Epic    In the last 12 months, was there a time when you were not able to pay the mortgage or rent on time?: No    In the past 12 months, how many times have you moved where you were living?: 0    At any time in the past 12 months, were you homeless or living in a shelter (including now)?: No  Recent Concern: Housing - High Risk (02/27/2024)   Received from [facility]   Epic    In the last 12 months, was there a time when you were not able to pay the mortgage or rent on time?: Yes    In the past 12 months, how many times have you moved where  you were living?: 0    At any time in the past 12 months, were you homeless or living in a shelter (including now)?: No  Utilities: Not At Risk (03/25/2024)   Received from [facility]   Epic    In the past 12 months has the electric, gas, oil, or water  company threatened to shut off services in your home?: No  Health Literacy: Adequate Health Literacy (10/09/2023)   B1300 Health Literacy    Frequency of need for help with medical instructions: Rarely   Past Surgical History:  Procedure Laterality Date   ABDOMINAL HYSTERECTOMY     ANKLE CLOSED REDUCTION Right 04/15/2022   Procedure: CLOSED REDUCTION ANKLE;  Surgeon: Silva Juliene SAUNDERS, DPM;  Location: [facility] ORS;  Service: Podiatry;  Laterality: Right;   CARPAL TUNNEL RELEASE Right 11/07/2022   Procedure: CARPAL TUNNEL RELEASE ENDOSCOPIC;  Surgeon: Edie Norleen PARAS, MD;  Location: [facility] ORS;  Service: Orthopedics;  Laterality: Right;  3rd case   CARPAL TUNNEL RELEASE Left 02/07/2023   Procedure: CARPAL TUNNEL RELEASE ENDOSCOPIC;  Surgeon: Edie Norleen PARAS, MD;  Location: [facility] ORS;  Service: Orthopedics;  Laterality: Left;  3rd case   COLONOSCOPY WITH PROPOFOL  N/A 03/30/2019   Procedure: COLONOSCOPY WITH PROPOFOL ;  Surgeon: Unk Corinn Skiff, MD;  Location: [facility] ENDOSCOPY;  Service: Gastroenterology;  Laterality: N/A;   ESOPHAGOGASTRODUODENOSCOPY (EGD) WITH PROPOFOL  N/A 03/12/2017   Procedure: ESOPHAGOGASTRODUODENOSCOPY (EGD) WITH PROPOFOL ;  Surgeon: Unk Corinn Skiff, MD;  Location: [facility] ENDOSCOPY;  Service: Gastroenterology;  Laterality: N/A;   ESOPHAGOGASTRODUODENOSCOPY (EGD) WITH PROPOFOL  N/A 03/30/2019   Procedure: ESOPHAGOGASTRODUODENOSCOPY (EGD) WITH PROPOFOL ;  Surgeon: Unk Corinn Skiff, MD;  Location: [facility] ENDOSCOPY;  Service: Gastroenterology;  Laterality: N/A;   HERNIA REPAIR     INCISIONAL HERNIA REPAIR N/A 11/29/2020   Procedure: HERNIA REPAIR INCISIONAL, open;  Surgeon: Jordis Laneta FALCON, MD;  Location: [facility] ORS;   Service: General;  Laterality: N/A;   ORIF ANKLE FRACTURE Left 07/30/2019   Procedure: OPEN REDUCTION INTERNAL FIXATION (ORIF) OF LEFT BIMALLEOLAR ANKLE FRACTURE;  Surgeon: Tobie Priest, MD;  Location: [facility];  Service: Orthopedics;  Laterality: Left;  Diabetic - oral meds   ORIF ANKLE FRACTURE Right 05/03/2022   Procedure: OPEN REDUCTION INTERNAL FIXATION (ORIF) ANKLE FRACTURE - TRIMALLEOLAR WITH INTERNAL FIXATION;  Surgeon: Lennie Barter, DPM;  Location: [facility] ORS;  Service: Podiatry;  Laterality: Right;   SUPRACERVICAL ABDOMINAL HYSTERECTOMY     UMBILICAL HERNIA REPAIR N/A 05/01/2017   Procedure: HERNIA REPAIR UMBILICAL ADULT;  Surgeon: Nicholaus Selinda Birmingham, MD;  Location: [facility] ORS;  Service: General;  Laterality: N/A;   XI ROBOTIC ASSISTED VENTRAL HERNIA N/A 10/06/2019   Procedure: XI ROBOTIC ASSISTED VENTRAL HERNIA;  Surgeon: Jordis Laneta FALCON, MD;  Location: [facility] OR[facility];  Service: General;  Laterality: N/A;   Past Medical History:  Diagnosis Date   Allergy    Anemia    Arthritis    spine   Bilateral leg edema    Chest pain 12/15/2014   Chronic sciatica 12/10/2014   COVID-19    2023   DDD (degenerative disc disease), lumbar    Essential hypertension 12/10/2014   Gastroesophageal reflux disease without esophagitis    Hand fracture, right    Mixed hyperlipidemia    Neuropathy    Obesity (BMI 30.0-34.9) 12/10/2014   Pneumonia 1968   Sleep apnea    new cpap   Type 2 diabetes mellitus without complication 12/10/2014   Umbilical hernia    BP 101/75   Pulse 95   Ht 5' 2 (1.575 m)   Wt 153 lb (69.4 kg)   SpO2 96%   BMI 27.98 kg/m   Opioid Risk Score:   Fall Risk Score:  `1  Depression screen [place] 2/9     03/03/2024    1:26 PM 01/16/2024    3:57 PM 12/16/2023  4:56 PM 11/07/2023   12:05 PM 10/09/2023   11:09 AM 10/09/2023   10:53 AM 08/12/2023    1:34 PM  Depression screen PHQ 2/9  Decreased Interest 0 0 0 0 0 0 0  Down, Depressed, Hopeless 0 0 0 0 0 0 0  PHQ - 2  Score 0 0 0 0 0 0 0  Altered sleeping 1      0  Tired, decreased energy 0      0  Change in appetite 0      0  Feeling bad or failure about yourself  0      0  Trouble concentrating 0      0  Moving slowly or fidgety/restless 1      0  Suicidal thoughts 0      0  PHQ-9 Score 2      0   Difficult doing work/chores Somewhat difficult           Data saved with a previous flowsheet row definition     Review of Systems  Musculoskeletal:  Positive for gait problem.       B/L leg pain  Neurological:  Positive for numbness. Negative for weakness.  All other systems reviewed and are negative.      Objective:   Physical Exam Gen: no distress, normal appearing HEENT: oral mucosa pink and moist, NCAT Cardio: Reg rate Chest: normal effort, normal rate of breathing Abd: soft, non-distended Ext: no edema Psych: pleasant, normal affect Skin: intact Neuro: Alert and oriented x3, decreased sensation in bilateral feet, no cognitive deficits      Assessment & Plan:  1) Chronic Pain Syndrome secondary to diabetic peripheral neuropathy  -Discussed Qutenza as an option for neuropathic pain control. Discussed that this is a capsaicin patch, stronger than capsaicin cream. Discussed that it is currently approved for diabetic peripheral neuropathy and post-herpetic neuralgia, but that it has also shown benefit in treating other forms of neuropathy. Provided patient with link to site to learn more about the patch: https://www.clark.biz/. Discussed that the patch would be placed in office and benefits usually last 3 months. Discussed that unintended exposure to capsaicin can cause severe irritation of eyes, mucous membranes, respiratory tract, and skin, but that Qutenza is a local treatment and does not have the systemic side effects of other nerve medications. Discussed that there may be pain, itching, erythema, and decreased sensory function associated with the application of Qutenza. Side effects  usually subside within 1 week. A cold pack of analgesic medications can help with these side effects. Blood pressure can also be increased due to pain associated with administration of the patch.  -discussed that she has Medicare and to please share her insurance with the front desk to see if this will covering remaining 15% of Qutenza  We are recommending Qutenza 8% capsaicin to treat this patient's pain. Qutenza is the first-line treatment option recommended for diabetic peripheral neuropathy by the AACE and ADA.   Qutenza is a safer option for this patient due to the following: Gabapentin  use has been associated with increased risk of dementia Lyrica  use has been associated with increased risk of heart failure Cymblata, Venlafaxine, and other SSRIs/SNRIs are associated with increased weight gain Lidocaine  5% has been prescribed/tried   We are recommending Qutenza 8% capsaicin to treat this patient's pain. Qutenza is the first-line treatment option recommended for diabetic peripheral neuropathy by the AACE and ADA.   Qutenza is a safer option for this patient  due to the following: Gabapentin  use has been associated with increased risk of dementia Lyrica  use has been associated with increased risk of heart failure Cymblata, Venlafaxine, and other SSRIs/SNRIs are associated with increased weight gain Lidocaine  5%- she has allergy  -Discussed current symptoms of pain and history of pain.  -Discussed benefits of exercise in reducing pain. -Discussed following foods that may reduce pain: 1) Ginger (especially studied for arthritis)- reduce leukotriene production to decrease inflammation 2) Blueberries- high in phytonutrients that decrease inflammation 3) Salmon- marine omega-3s reduce joint swelling and pain 4) Pumpkin seeds- reduce inflammation 5) dark chocolate- reduces inflammation 6) turmeric- reduces inflammation 7) tart cherries - reduce pain and stiffness 8) extra virgin olive oil -  its compound olecanthal helps to block prostaglandins  9) chili peppers- can be eaten or applied topically via capsaicin 10) mint- helpful for headache, muscle aches, joint pain, and itching 11) garlic- reduces inflammation 12) Green tea- reduces inflammation and oxidative stress, helps with weight loss, may reduce the risk of cancer, recommend Double [employer] of Tea daily  Link to further information on diet for chronic pain: http://www.bray.com/   -discussed mechanism of action of low dose naltrexone  as an opioid receptor antagonist which stimulates your body's production of its own natural endogenous opioids, helping to decrease pain. Discussed that it can also decrease T cell response and thus be helpful in decreasing inflammation, and symptoms of brain fog, fatigue, anxiety, depression, and allergies. Discussed that this medication needs to be compounded at a compounding pharmacy and can more expensive. Discussed that I usually start at 1mg  and if this is not providing enough relief then I titrate upward on a monthly basis.     2)Diabetes: -check CBGs daily, log, and bring log to follow-up appointment -avoid sugar, bread, pasta, rice -avoid snacking -perform daily foot exam and at least annual eye exam -try to incorporate into your diet some of the following foods which are good for diabetes: 1) cinnamon- imitates effects of insulin , increasing glucose transport into cells ([place] or Vietnamese cinnamon is best, least processed) 2) nuts- can slow down the blood sugar response of carbohydrate rich foods 3) oatmeal- contains and anti-inflammatory compound avenanthramide 4) whole-milk yogurt (best types are no sugar, Greek yogurt, or goat/sheep yogurt) 5) beans- high in protein, fiber, and vitamins, low glycemic index 6) broccoli- great source of vitamin A and C 7) quinoa- higher in protein and fiber than  other grains 8) spinach- high in vitamin A, fiber, and protein 9) olive oil- reduces glucose levels, LDL, and triglycerides 10) salmon- excellent amount of omega-3-fatty acids 11) walnuts- rich in antioxidants 12) apples- high in fiber and quercetin 13) carrots- highly nutritious with low impact on blood sugar 14) eggs- improve HDL (good cholesterol), high in protein, keep you satiated 15) turmeric: improves blood sugars, cardiovascular disease, and protects kidney health 16) garlic: improves blood sugar, blood pressure, pain 17) tomatoes: highly nutritious with low impact on blood sugar   3) Impaired balance: Discussed Qutenza can help with this as well -decrease gabapentin  to 100mg  daily, 400mg  HS  4) Anxiety: -Discussed exercise and meditation as tools to decrease anxiety. -Recommended Down Dog Yoga app -Discussed spending time outdoors. -Discussed positive re-framing of anxiety.  -Discussed the following foods that have been show to reduce anxiety: 1) Brazil nuts, mushrooms, soy beans due to their high selenium content. Upper limit of toxicity of selenium is 478mcg/day so no more than 3-4 brazil nuts per day.  2) Fatty fish  such as salmon, mackerel, sardines, trout, and herring- high in omega-3 fatty acids 3) Eggs- increases serotonin and dopamine 4) Pumpkin seeds- high in omega-3 fatty acids 5) dark chocolate- high in flavanols that increase blood flow to brain 6) turmeric- take with black pepper to increase absorption 7) chamomile tea- antioxidant and anti-inflammatory properties 8) yogurt without sugar- supports gut-brain axis 9) green tea- contains L- theanine 10) blueberries- high in vitamin C and antioxidants 11) turkey- high in tryptophan which gets converted to serotonin 12) bell peppers- rich in vitamin C and antioxidants 13) citrus fruits- rich in vitamin C and antioxidants 14) almonds- high in vitamin E and healthy fats 15) chia seeds- high in omega-3 fatty  acids  5) Insomnia: -Try to go outside near sunrise -Get exercise during the day.  -Turn off all devices an hour before bedtime.  -Teas that can benefit: chamomile, valerian root, Brahmi (Bacopa) -Can consider over the counter melatonin, magnesium , and/or L-theanine. Melatonin is an anti-oxidant with multiple health benefits. Magnesium  is involved in greater than 300 enzymatic reactions in the body and most of us  are deficient as our soil is often depleted. There are 7 different types of magnesium - Bioptemizer's is a supplement with all 7 types, and each has unique benefits. Magnesium  can also help with constipation and anxiety.  -Pistachios naturally increase the production of melatonin -Cozy Earth bamboo bed sheets are free from toxic chemicals.  -Tart cherry juice or a tart cherry supplement can improve sleep and soreness post-workout   -discussed mechanism of action of low dose naltrexone  as an opioid receptor antagonist which stimulates your body's production of its own natural endogenous opioids, helping to decrease pain. Discussed that it can also decrease T cell response and thus be helpful in decreasing inflammation, and symptoms of brain fog, fatigue, anxiety, depression, and allergies. Discussed that this medication needs to be compounded at a compounding pharmacy and can more expensive. Discussed that I usually start at 1mg  and if this is not providing enough relief then I titrate upward on a monthly basis.   "

## 2024-05-21 NOTE — Patient Instructions (Signed)
 Anxiety: -Discussed exercise and meditation as tools to decrease anxiety. -Recommended Down Dog Yoga app -Discussed spending time outdoors. -Discussed positive re-framing of anxiety.  -Discussed the following foods that have been show to reduce anxiety: 1) Brazil nuts, mushrooms, soy beans due to their high selenium content. Upper limit of toxicity of selenium is 475mcg/day so no more than 3-4 brazil nuts per day.  2) Fatty fish such as salmon, mackerel, sardines, trout, and herring- high in omega-3 fatty acids 3) Eggs- increases serotonin and dopamine 4) Pumpkin seeds- high in omega-3 fatty acids 5) dark chocolate- high in flavanols that increase blood flow to brain 6) turmeric- take with black pepper to increase absorption 7) chamomile tea- antioxidant and anti-inflammatory properties 8) yogurt without sugar- supports gut-brain axis 9) green tea- contains L- theanine 10) blueberries- high in vitamin C and antioxidants 11) turkey- high in tryptophan which gets converted to serotonin 12) bell peppers- rich in vitamin C and antioxidants 13) citrus fruits- rich in vitamin C and antioxidants 14) almonds- high in vitamin E and healthy fats 15) chia seeds- high in omega-3 fatty acids  Insomnia: -Try to go outside near sunrise -Get exercise during the day.  -Turn off all devices an hour before bedtime.  -Teas that can benefit: chamomile, valerian root, Brahmi (Bacopa) -Can consider over the counter melatonin, magnesium , and/or L-theanine. Melatonin is an anti-oxidant with multiple health benefits. Magnesium  is involved in greater than 300 enzymatic reactions in the body and most of us  are deficient as our soil is often depleted. There are 7 different types of magnesium - Bioptemizer's is a supplement with all 7 types, and each has unique benefits. Magnesium  can also help with constipation and anxiety.  -Pistachios naturally increase the production of melatonin -Cozy Earth bamboo bed sheets are  free from toxic chemicals.  -Tart cherry juice or a tart cherry supplement can improve sleep and soreness post-workout

## 2024-05-21 NOTE — ED Provider Notes (Signed)
 "  [facility] Provider Note    Event Date/Time   First MD Initiated Contact with Patient 05/21/24 2155     (approximate)   History   Abnormal Lab   HPI  Deshae Dickison is a 64 y.o. female with a history of diabetes and reported hypokalemia who presents with reports of hypokalemia.  Her potassium is an outpatient was reported at 2.8.  However review of records demonstrates that her potassium is fluctuating between 2.8 and 3 over the last several weeks.  She feels well and has no complaints.  She reports her glucose has been well-controlled.  She has been taking 20 mEq twice daily but reports her PCP just increased her to 40 mill equivalents twice daily to start today     Physical Exam   Triage Vital Signs: ED Triage Vitals  Encounter Vitals Group     BP 05/21/24 1942 132/82     Girls Systolic BP Percentile --      Girls Diastolic BP Percentile --      Boys Systolic BP Percentile --      Boys Diastolic BP Percentile --      Pulse Rate 05/21/24 1942 100     Resp 05/21/24 1942 19     Temp 05/21/24 1942 98.3 F (36.8 C)     Temp Source 05/21/24 1942 Oral     SpO2 05/21/24 1942 98 %     Weight 05/21/24 1942 69.7 kg (153 lb 9.6 oz)     Height 05/21/24 1942 1.575 m (5' 2)     Head Circumference --      Peak Flow --      Pain Score 05/21/24 1946 0     Pain Loc --      Pain Education --      Exclude from Growth Chart --     Most recent vital signs: Vitals:   05/21/24 1942  BP: 132/82  Pulse: 100  Resp: 19  Temp: 98.3 F (36.8 C)  SpO2: 98%     General: Awake, no distress.  CV:  Good peripheral perfusion.  Resp:  Normal effort.  Abd:  No distention.  Other:     ED Results / Procedures / Treatments   Labs (all labs ordered are listed, but only abnormal results are displayed) Labs Reviewed  CBC WITH DIFFERENTIAL/PLATELET - Abnormal; Notable for the following components:      Result Value   Neutro Abs 1.5 (*)    All other components  within normal limits  COMPREHENSIVE METABOLIC PANEL WITH GFR - Abnormal; Notable for the following components:   Sodium 129 (*)    Potassium 2.7 (*)    Chloride 84 (*)    Glucose, Bld 286 (*)    BUN <5 (*)    AST 84 (*)    Anion gap 17 (*)    All other components within normal limits     EKG  ED ECG REPORT I, Lamar Price, the attending physician, personally viewed and interpreted this ECG.  Date: 05/21/2024  Rhythm: normal sinus rhythm QRS Axis: normal Intervals: normal ST/T Wave abnormalities: normal Narrative Interpretation: no evidence of acute ischemia    RADIOLOGY     PROCEDURES:  Critical Care performed:   Procedures   MEDICATIONS ORDERED IN ED: Medications  potassium chloride  (KLOR-CON ) packet 40 mEq (40 mEq Oral Given 05/21/24 2232)     IMPRESSION / MDM / ASSESSMENT AND PLAN / ED COURSE  I reviewed the triage vital  signs and the nursing notes. Patient's presentation is most consistent with exacerbation of chronic illness.  Patient presents with reports of hypokalemia as described above, she is asymptomatic and feels well.  Recheck labs, her potassium here is 2.7.  She is not on Lasix or diuretic.  Her glucose is mildly elevated with normal CO2, not consistent with DKA, pseudo hyponatremia  Will give potassium 40 mill equivalents here, she will take 20 more when she gets home, no indication for admission at this time, recommended trialing the 40 mill equivalents twice daily and rechecking potassium next week        FINAL CLINICAL IMPRESSION(S) / ED DIAGNOSES   Final diagnoses:  Hypokalemia     Rx / DC Orders   ED Discharge Orders     None        Note:  This document was prepared using Dragon voice recognition software and may include unintentional dictation errors.   Arlander Charleston, MD 05/21/24 2248  "

## 2024-05-27 ENCOUNTER — Telehealth: Payer: Self-pay

## 2024-05-27 ENCOUNTER — Other Ambulatory Visit: Payer: Self-pay

## 2024-05-27 DIAGNOSIS — R195 Other fecal abnormalities: Secondary | ICD-10-CM

## 2024-05-27 MED ORDER — GOLYTELY 236 G PO SOLR: 4000.0000 mL | Freq: Once | ORAL | 0 refills | Status: AC

## 2024-05-27 NOTE — Telephone Encounter (Signed)
 OK to hold eliquis  3 days prior (06/16/24) and restart the day after colonoscopy

## 2024-05-27 NOTE — Telephone Encounter (Signed)
 Patient has been advised  OK to hold eliquis  3 days prior (06/16/24) and restart the day after colonoscopy  Per Dr. Duwaine Louder.  Instructions updated to add this to it.  Thanks,  [place], CMA

## 2024-05-27 NOTE — Telephone Encounter (Signed)
 Gastroenterology Pre-Procedure Review  Request Date: 06/19/24 Requesting Physician: Dr. Theophilus  PATIENT REVIEW QUESTIONS: The patient responded to the following health history questions as indicated:    1. Are you having any GI issues? Positive Fecal Occult 2. Do you have a personal history of Polyps? no 3. Do you have a family history of Colon Cancer or Polyps? no 4. Diabetes Mellitus? yes (takes Metformin  has been advised and noted on instructions to stop 2 days prior) 5. Joint replacements in the past 12 months?Ankle surgery last year 6. Major health problems in the past 3 months?no 7. Any artificial heart valves, MVP, or defibrillator?no    MEDICATIONS & ALLERGIES:    Patient reports the following regarding taking any anticoagulation/antiplatelet therapy:   Plavix, Coumadin, Eliquis , Xarelto, Lovenox , Pradaxa, Brilinta, or Effient? yes (Pt takes Eliquis .  Stated rx is overseen by her PCP Dr. Duwaine Louder.  Blood thinner advice routed to her in this message for advice) Aspirin ? no  Patient confirms/reports the following medications:  Current Outpatient Medications  Medication Sig Dispense Refill   Accu-Chek Softclix Lancets lancets TEST TWICE DAILY 100 each 12   apixaban  (ELIQUIS ) 5 MG TABS tablet Take 1 tablet (5 mg total) by mouth 2 (two) times daily. 180 tablet 1   apixaban  (ELIQUIS ) 5 MG TABS tablet Take 1 tablet (5 mg total) by mouth 2 (two) times daily.     atorvastatin  (LIPITOR) 20 MG tablet TAKE 1 TABLET(20 MG) BY MOUTH DAILY 90 tablet 1   B Complex-C-Folic Acid  (B COMPLEX-VITAMIN C-FOLIC ACID ) 1 MG tablet Take 1 tablet by mouth daily with breakfast. 90 tablet 3   Cholecalciferol  (VITAMIN D -3) 5000 units TABS Take 5,000 Units by mouth daily.     Cholecalciferol  (VITAMIN D3) 125 MCG (5000 UT) CAPS Take 1 capsule (5,000 Units total) by mouth daily. 90 capsule 3   clotrimazole -betamethasone  (LOTRISONE ) cream Apply 1 Application topically daily. (Patient not taking: Reported  on 03/25/2024) 30 g 0   diclofenac  Sodium (VOLTAREN ) 1 % GEL Apply 4 g topically 4 (four) times daily. (Patient not taking: Reported on 03/25/2024) 350 g 4   DULoxetine  (CYMBALTA ) 60 MG capsule Take 1 capsule (60 mg total) by mouth daily. 90 capsule 1   EPINEPHrine  0.3 mg/0.3 mL IJ SOAJ injection Inject 0.3 mg into the muscle as needed for anaphylaxis. 1 each 12   folic acid  (FOLVITE ) 1 MG tablet Take 1 tablet (1 mg total) by mouth daily. 90 tablet 3   gabapentin  (NEURONTIN ) 100 MG capsule Take 2 capsules (200 mg total) by mouth daily with breakfast. 180 capsule 1   gabapentin  (NEURONTIN ) 400 MG capsule Take 1 capsule (400 mg total) by mouth at bedtime. 90 capsule 1   hydrochlorothiazide  (HYDRODIURIL ) 25 MG tablet TAKE 1 TABLET(25 MG) BY MOUTH DAILY 90 tablet 0   ketoconazole  (NIZORAL ) 2 % cream Apply topically daily as needed for irritation. (Patient not taking: Reported on 03/25/2024) 60 g 1   levocetirizine (XYZAL  ALLERGY 24HR) 5 MG tablet Take 1 tablet (5 mg total) by mouth every evening. 100 tablet 1   Magnesium  Oxide -Mg Supplement 500 MG TABS Take 1 tablet (500 mg total) by mouth daily. 90 tablet 3   metFORMIN  (GLUCOPHAGE ) 500 MG tablet Take 1 tablet (500 mg total) by mouth daily with breakfast. 90 tablet 1   Multiple Vitamin (MULTIVITAMIN) tablet Take 1 tablet by mouth daily.     Naltrexone  HCl, Pain, 1.5 MG CAPS Take 3 tablets by mouth at bedtime. 90 capsule 3  omeprazole  (PRILOSEC) 40 MG capsule TAKE 1 CAPSULE(40 MG) BY MOUTH BID 180 capsule 1   polyethylene glycol (MIRALAX / GLYCOLAX) 17 g packet Take by mouth.     potassium chloride  (KLOR-CON  M) 10 MEQ tablet Take 2 tablets (20 mEq total) by mouth 2 (two) times daily. 120 tablet 1   senna (SENOKOT) 8.6 MG tablet Take 2 tablets by mouth at bedtime.     thiamine  (VITAMIN B1) 100 MG tablet Take 1 tablet (100 mg total) by mouth daily. 90 tablet 3   traZODone  (DESYREL ) 50 MG tablet Take 1 tablet (50 mg total) by mouth at bedtime as needed for  sleep. 90 tablet 1   vitamin B-12 (CYANOCOBALAMIN ) 1000 MCG tablet Take 1,000 mcg by mouth 2 (two) times daily.     No current facility-administered medications for this visit.    Patient confirms/reports the following allergies:  Allergies[1]  No orders of the defined types were placed in this encounter.   AUTHORIZATION INFORMATION Primary Insurance: 1D#: Group #:  Secondary Insurance: 1D#: Group #:  SCHEDULE INFORMATION: Date: 06/19/24 Time: Location: [facility]    [1]  Allergies Allergen Reactions   Benazepril Swelling    Face and mouth swelling.   Ivp Dye [Iodinated Contrast Media] Anaphylaxis    Patient unsure of reaction but in case of reaction d/t shellfish allergy   Lidoderm  [Lidocaine ] Rash    02-01-2021: Patient had lidocaine  patch applied to chest at sight of injury and she experienced a rash, whelping, and swelling. Resolved after removing the patch. Likely to be the adhesive as she can tolerate injectable lidocaine  w/o problems.   Lyrica  [Pregabalin ] Swelling    Face/ mouth swelled up balloon   Shellfish Allergy Anaphylaxis and Swelling    Betadine on skin is okay   Shellfish Protein-Containing Drug Products Anaphylaxis   Trulicity  [Dulaglutide ] Swelling    Face and mouth swelled up   Ace Inhibitors Swelling   Gabapentin  Other (See Comments)    Severe confusion and weakness at higher doses- required hospitalization

## 2024-06-01 ENCOUNTER — Other Ambulatory Visit

## 2024-06-05 ENCOUNTER — Encounter: Admitting: Physical Medicine and Rehabilitation

## 2024-06-19 ENCOUNTER — Ambulatory Visit: Admit: 2024-06-19

## 2024-06-23 ENCOUNTER — Ambulatory Visit: Admitting: Family Medicine

## 2024-09-07 ENCOUNTER — Ambulatory Visit (INDEPENDENT_AMBULATORY_CARE_PROVIDER_SITE_OTHER): Admitting: Vascular Surgery
